# Patient Record
Sex: Female | Born: 1975 | Race: Black or African American | Hispanic: No | Marital: Single | Smoking: Never smoker
Health system: Southern US, Community
[De-identification: ages and names within clinical notes are randomized; demographics above are authoritative.]

## PROBLEM LIST (undated history)

## (undated) ENCOUNTER — Inpatient Hospital Stay (HOSPITAL_COMMUNITY): Payer: Self-pay

## (undated) DIAGNOSIS — M47816 Spondylosis without myelopathy or radiculopathy, lumbar region: Secondary | ICD-10-CM

## (undated) DIAGNOSIS — Z9889 Other specified postprocedural states: Secondary | ICD-10-CM

## (undated) DIAGNOSIS — J189 Pneumonia, unspecified organism: Secondary | ICD-10-CM

## (undated) DIAGNOSIS — E282 Polycystic ovarian syndrome: Secondary | ICD-10-CM

## (undated) DIAGNOSIS — D649 Anemia, unspecified: Secondary | ICD-10-CM

## (undated) DIAGNOSIS — R112 Nausea with vomiting, unspecified: Secondary | ICD-10-CM

## (undated) DIAGNOSIS — R7303 Prediabetes: Secondary | ICD-10-CM

## (undated) DIAGNOSIS — G5601 Carpal tunnel syndrome, right upper limb: Secondary | ICD-10-CM

## (undated) DIAGNOSIS — T4145XA Adverse effect of unspecified anesthetic, initial encounter: Secondary | ICD-10-CM

## (undated) DIAGNOSIS — F32A Depression, unspecified: Secondary | ICD-10-CM

## (undated) DIAGNOSIS — M7989 Other specified soft tissue disorders: Secondary | ICD-10-CM

## (undated) DIAGNOSIS — I10 Essential (primary) hypertension: Secondary | ICD-10-CM

## (undated) DIAGNOSIS — F419 Anxiety disorder, unspecified: Secondary | ICD-10-CM

## (undated) DIAGNOSIS — F909 Attention-deficit hyperactivity disorder, unspecified type: Secondary | ICD-10-CM

## (undated) DIAGNOSIS — N979 Female infertility, unspecified: Secondary | ICD-10-CM

## (undated) DIAGNOSIS — F329 Major depressive disorder, single episode, unspecified: Secondary | ICD-10-CM

## (undated) DIAGNOSIS — G709 Myoneural disorder, unspecified: Secondary | ICD-10-CM

## (undated) DIAGNOSIS — M1990 Unspecified osteoarthritis, unspecified site: Secondary | ICD-10-CM

## (undated) DIAGNOSIS — M549 Dorsalgia, unspecified: Secondary | ICD-10-CM

## (undated) DIAGNOSIS — I2510 Atherosclerotic heart disease of native coronary artery without angina pectoris: Secondary | ICD-10-CM

## (undated) DIAGNOSIS — Z8701 Personal history of pneumonia (recurrent): Secondary | ICD-10-CM

## (undated) DIAGNOSIS — M2550 Pain in unspecified joint: Secondary | ICD-10-CM

## (undated) DIAGNOSIS — T8859XA Other complications of anesthesia, initial encounter: Secondary | ICD-10-CM

## (undated) HISTORY — PX: BACK SURGERY: SHX140

## (undated) HISTORY — DX: Female infertility, unspecified: N97.9

## (undated) HISTORY — DX: Pain in unspecified joint: M25.50

## (undated) HISTORY — DX: Unspecified osteoarthritis, unspecified site: M19.90

## (undated) HISTORY — DX: Polycystic ovarian syndrome: E28.2

## (undated) HISTORY — DX: Anemia, unspecified: D64.9

## (undated) HISTORY — PX: KNEE ARTHROSCOPY: SUR90

## (undated) HISTORY — DX: Depression, unspecified: F32.A

## (undated) HISTORY — PX: DILATION AND CURETTAGE OF UTERUS: SHX78

## (undated) HISTORY — PX: SHOULDER ARTHROSCOPY: SHX128

## (undated) HISTORY — DX: Major depressive disorder, single episode, unspecified: F32.9

## (undated) HISTORY — PX: ANKLE SURGERY: SHX546

## (undated) HISTORY — DX: Anxiety disorder, unspecified: F41.9

## (undated) HISTORY — DX: Essential (primary) hypertension: I10

## (undated) HISTORY — DX: Other specified soft tissue disorders: M79.89

---

## 1998-01-31 ENCOUNTER — Ambulatory Visit (HOSPITAL_COMMUNITY): Admission: RE | Admit: 1998-01-31 | Discharge: 1998-01-31 | Payer: Self-pay | Admitting: Orthopedic Surgery

## 1998-02-28 ENCOUNTER — Ambulatory Visit (HOSPITAL_COMMUNITY): Admission: RE | Admit: 1998-02-28 | Discharge: 1998-02-28 | Payer: Self-pay | Admitting: Orthopedic Surgery

## 1998-10-01 ENCOUNTER — Emergency Department (HOSPITAL_COMMUNITY): Admission: EM | Admit: 1998-10-01 | Discharge: 1998-10-01 | Payer: Self-pay | Admitting: Emergency Medicine

## 1998-10-05 ENCOUNTER — Emergency Department (HOSPITAL_COMMUNITY): Admission: EM | Admit: 1998-10-05 | Discharge: 1998-10-05 | Payer: Self-pay | Admitting: Endocrinology

## 1998-10-05 ENCOUNTER — Encounter: Payer: Self-pay | Admitting: Endocrinology

## 1998-11-27 ENCOUNTER — Emergency Department (HOSPITAL_COMMUNITY): Admission: EM | Admit: 1998-11-27 | Discharge: 1998-11-27 | Payer: Self-pay | Admitting: Emergency Medicine

## 1998-11-27 ENCOUNTER — Encounter: Payer: Self-pay | Admitting: Emergency Medicine

## 1998-12-18 ENCOUNTER — Encounter: Admission: RE | Admit: 1998-12-18 | Discharge: 1999-03-18 | Payer: Self-pay | Admitting: Orthopedic Surgery

## 1998-12-30 ENCOUNTER — Ambulatory Visit (HOSPITAL_COMMUNITY): Admission: RE | Admit: 1998-12-30 | Discharge: 1998-12-30 | Payer: Self-pay | Admitting: Family Medicine

## 1998-12-30 ENCOUNTER — Encounter: Payer: Self-pay | Admitting: Family Medicine

## 1999-02-21 ENCOUNTER — Encounter: Payer: Self-pay | Admitting: Emergency Medicine

## 1999-02-21 ENCOUNTER — Emergency Department (HOSPITAL_COMMUNITY): Admission: EM | Admit: 1999-02-21 | Discharge: 1999-02-21 | Payer: Self-pay | Admitting: Emergency Medicine

## 1999-05-27 ENCOUNTER — Ambulatory Visit (HOSPITAL_COMMUNITY): Admission: RE | Admit: 1999-05-27 | Discharge: 1999-05-27 | Payer: Self-pay | Admitting: Family Medicine

## 1999-05-27 ENCOUNTER — Encounter: Payer: Self-pay | Admitting: Family Medicine

## 1999-12-11 ENCOUNTER — Ambulatory Visit (HOSPITAL_COMMUNITY): Admission: RE | Admit: 1999-12-11 | Discharge: 1999-12-11 | Payer: Self-pay | Admitting: Family Medicine

## 1999-12-11 ENCOUNTER — Encounter: Payer: Self-pay | Admitting: Family Medicine

## 2000-01-27 ENCOUNTER — Other Ambulatory Visit: Admission: RE | Admit: 2000-01-27 | Discharge: 2000-01-27 | Payer: Self-pay | Admitting: Gynecology

## 2000-02-04 ENCOUNTER — Other Ambulatory Visit: Admission: RE | Admit: 2000-02-04 | Discharge: 2000-02-04 | Payer: Self-pay | Admitting: Gynecology

## 2000-02-04 ENCOUNTER — Encounter (INDEPENDENT_AMBULATORY_CARE_PROVIDER_SITE_OTHER): Payer: Self-pay

## 2000-10-06 ENCOUNTER — Encounter: Payer: Self-pay | Admitting: Gynecology

## 2000-10-06 ENCOUNTER — Encounter: Admission: RE | Admit: 2000-10-06 | Discharge: 2000-10-06 | Payer: Self-pay | Admitting: Gynecology

## 2001-06-30 ENCOUNTER — Other Ambulatory Visit: Admission: RE | Admit: 2001-06-30 | Discharge: 2001-06-30 | Payer: Self-pay | Admitting: Gynecology

## 2002-02-01 ENCOUNTER — Ambulatory Visit (HOSPITAL_BASED_OUTPATIENT_CLINIC_OR_DEPARTMENT_OTHER): Admission: RE | Admit: 2002-02-01 | Discharge: 2002-02-02 | Payer: Self-pay | Admitting: Orthopedic Surgery

## 2002-02-01 HISTORY — PX: ENDOSCOPIC PLANTAR FASCIOTOMY: SUR443

## 2002-06-15 ENCOUNTER — Other Ambulatory Visit: Admission: RE | Admit: 2002-06-15 | Discharge: 2002-06-15 | Payer: Self-pay | Admitting: Gynecology

## 2003-01-10 ENCOUNTER — Ambulatory Visit (HOSPITAL_BASED_OUTPATIENT_CLINIC_OR_DEPARTMENT_OTHER): Admission: RE | Admit: 2003-01-10 | Discharge: 2003-01-11 | Payer: Self-pay | Admitting: Orthopedic Surgery

## 2003-01-10 HISTORY — PX: TOE SURGERY: SHX1073

## 2003-03-07 ENCOUNTER — Ambulatory Visit (HOSPITAL_BASED_OUTPATIENT_CLINIC_OR_DEPARTMENT_OTHER): Admission: RE | Admit: 2003-03-07 | Discharge: 2003-03-08 | Payer: Self-pay | Admitting: Orthopedic Surgery

## 2003-03-21 ENCOUNTER — Ambulatory Visit (HOSPITAL_BASED_OUTPATIENT_CLINIC_OR_DEPARTMENT_OTHER): Admission: RE | Admit: 2003-03-21 | Discharge: 2003-03-22 | Payer: Self-pay | Admitting: Orthopedic Surgery

## 2003-07-09 ENCOUNTER — Other Ambulatory Visit: Admission: RE | Admit: 2003-07-09 | Discharge: 2003-07-09 | Payer: Self-pay | Admitting: Gynecology

## 2004-07-24 ENCOUNTER — Other Ambulatory Visit: Admission: RE | Admit: 2004-07-24 | Discharge: 2004-07-24 | Payer: Self-pay | Admitting: Gynecology

## 2004-08-11 ENCOUNTER — Emergency Department (HOSPITAL_COMMUNITY): Admission: EM | Admit: 2004-08-11 | Discharge: 2004-08-11 | Payer: Self-pay | Admitting: Emergency Medicine

## 2004-08-29 ENCOUNTER — Emergency Department (HOSPITAL_COMMUNITY): Admission: EM | Admit: 2004-08-29 | Discharge: 2004-08-29 | Payer: Self-pay | Admitting: Family Medicine

## 2004-11-17 ENCOUNTER — Emergency Department (HOSPITAL_COMMUNITY): Admission: EM | Admit: 2004-11-17 | Discharge: 2004-11-17 | Payer: Self-pay | Admitting: Family Medicine

## 2004-11-26 ENCOUNTER — Ambulatory Visit (HOSPITAL_BASED_OUTPATIENT_CLINIC_OR_DEPARTMENT_OTHER): Admission: RE | Admit: 2004-11-26 | Discharge: 2004-11-26 | Payer: Self-pay | Admitting: Orthopedic Surgery

## 2005-01-14 ENCOUNTER — Ambulatory Visit (HOSPITAL_BASED_OUTPATIENT_CLINIC_OR_DEPARTMENT_OTHER): Admission: RE | Admit: 2005-01-14 | Discharge: 2005-01-15 | Payer: Self-pay | Admitting: Orthopedic Surgery

## 2005-02-08 ENCOUNTER — Encounter: Admission: RE | Admit: 2005-02-08 | Discharge: 2005-05-09 | Payer: Self-pay | Admitting: Orthopedic Surgery

## 2005-03-28 ENCOUNTER — Emergency Department (HOSPITAL_COMMUNITY): Admission: EM | Admit: 2005-03-28 | Discharge: 2005-03-28 | Payer: Self-pay | Admitting: Family Medicine

## 2005-06-29 ENCOUNTER — Encounter: Admission: RE | Admit: 2005-06-29 | Discharge: 2005-09-19 | Payer: Self-pay | Admitting: Physician Assistant

## 2005-07-06 ENCOUNTER — Other Ambulatory Visit: Admission: RE | Admit: 2005-07-06 | Discharge: 2005-07-06 | Payer: Self-pay | Admitting: Gynecology

## 2005-07-15 ENCOUNTER — Emergency Department (HOSPITAL_COMMUNITY): Admission: EM | Admit: 2005-07-15 | Discharge: 2005-07-15 | Payer: Self-pay | Admitting: Family Medicine

## 2005-09-15 ENCOUNTER — Emergency Department (HOSPITAL_COMMUNITY): Admission: EM | Admit: 2005-09-15 | Discharge: 2005-09-15 | Payer: Self-pay | Admitting: Family Medicine

## 2005-09-26 ENCOUNTER — Emergency Department (HOSPITAL_COMMUNITY): Admission: AD | Admit: 2005-09-26 | Discharge: 2005-09-26 | Payer: Self-pay | Admitting: Emergency Medicine

## 2006-07-04 ENCOUNTER — Ambulatory Visit (HOSPITAL_COMMUNITY): Admission: RE | Admit: 2006-07-04 | Discharge: 2006-07-04 | Payer: Self-pay | Admitting: Family Medicine

## 2006-07-04 ENCOUNTER — Emergency Department (HOSPITAL_COMMUNITY): Admission: EM | Admit: 2006-07-04 | Discharge: 2006-07-04 | Payer: Self-pay | Admitting: Family Medicine

## 2006-07-05 ENCOUNTER — Other Ambulatory Visit: Admission: RE | Admit: 2006-07-05 | Discharge: 2006-07-05 | Payer: Self-pay | Admitting: Gynecology

## 2006-07-06 ENCOUNTER — Encounter: Admission: RE | Admit: 2006-07-06 | Discharge: 2006-07-06 | Payer: Self-pay | Admitting: Gynecology

## 2006-08-29 ENCOUNTER — Emergency Department (HOSPITAL_COMMUNITY): Admission: EM | Admit: 2006-08-29 | Discharge: 2006-08-29 | Payer: Self-pay | Admitting: Emergency Medicine

## 2006-08-31 ENCOUNTER — Ambulatory Visit (HOSPITAL_BASED_OUTPATIENT_CLINIC_OR_DEPARTMENT_OTHER): Admission: RE | Admit: 2006-08-31 | Discharge: 2006-08-31 | Payer: Self-pay | Admitting: Orthopedic Surgery

## 2006-09-27 ENCOUNTER — Emergency Department (HOSPITAL_COMMUNITY): Admission: EM | Admit: 2006-09-27 | Discharge: 2006-09-27 | Payer: Self-pay | Admitting: Family Medicine

## 2007-02-10 ENCOUNTER — Emergency Department (HOSPITAL_COMMUNITY): Admission: EM | Admit: 2007-02-10 | Discharge: 2007-02-10 | Payer: Self-pay | Admitting: Family Medicine

## 2007-05-22 ENCOUNTER — Emergency Department (HOSPITAL_COMMUNITY): Admission: EM | Admit: 2007-05-22 | Discharge: 2007-05-22 | Payer: Self-pay | Admitting: *Deleted

## 2007-05-23 ENCOUNTER — Encounter (INDEPENDENT_AMBULATORY_CARE_PROVIDER_SITE_OTHER): Payer: Self-pay | Admitting: *Deleted

## 2007-05-23 ENCOUNTER — Ambulatory Visit: Payer: Self-pay | Admitting: Vascular Surgery

## 2007-05-23 ENCOUNTER — Ambulatory Visit (HOSPITAL_COMMUNITY): Admission: RE | Admit: 2007-05-23 | Discharge: 2007-05-23 | Payer: Self-pay | Admitting: *Deleted

## 2007-07-02 ENCOUNTER — Emergency Department (HOSPITAL_COMMUNITY): Admission: EM | Admit: 2007-07-02 | Discharge: 2007-07-02 | Payer: Self-pay | Admitting: Family Medicine

## 2007-07-20 ENCOUNTER — Other Ambulatory Visit: Admission: RE | Admit: 2007-07-20 | Discharge: 2007-07-20 | Payer: Self-pay | Admitting: Gynecology

## 2007-10-09 ENCOUNTER — Emergency Department (HOSPITAL_COMMUNITY): Admission: EM | Admit: 2007-10-09 | Discharge: 2007-10-09 | Payer: Self-pay | Admitting: Emergency Medicine

## 2007-11-16 ENCOUNTER — Encounter: Admission: RE | Admit: 2007-11-16 | Discharge: 2007-11-16 | Payer: Self-pay | Admitting: Orthopedic Surgery

## 2007-12-07 ENCOUNTER — Emergency Department (HOSPITAL_COMMUNITY): Admission: EM | Admit: 2007-12-07 | Discharge: 2007-12-07 | Payer: Self-pay | Admitting: Emergency Medicine

## 2008-02-21 ENCOUNTER — Emergency Department (HOSPITAL_COMMUNITY): Admission: EM | Admit: 2008-02-21 | Discharge: 2008-02-21 | Payer: Self-pay | Admitting: Family Medicine

## 2008-05-07 ENCOUNTER — Emergency Department (HOSPITAL_COMMUNITY): Admission: EM | Admit: 2008-05-07 | Discharge: 2008-05-07 | Payer: Self-pay | Admitting: Family Medicine

## 2008-06-30 ENCOUNTER — Emergency Department (HOSPITAL_COMMUNITY): Admission: EM | Admit: 2008-06-30 | Discharge: 2008-06-30 | Payer: Self-pay | Admitting: Family Medicine

## 2008-07-24 ENCOUNTER — Other Ambulatory Visit: Admission: RE | Admit: 2008-07-24 | Discharge: 2008-07-24 | Payer: Self-pay

## 2008-08-21 ENCOUNTER — Inpatient Hospital Stay (HOSPITAL_COMMUNITY): Admission: RE | Admit: 2008-08-21 | Discharge: 2008-08-26 | Payer: Self-pay | Admitting: Orthopedic Surgery

## 2008-08-23 ENCOUNTER — Encounter (INDEPENDENT_AMBULATORY_CARE_PROVIDER_SITE_OTHER): Payer: Self-pay | Admitting: Orthopedic Surgery

## 2008-08-23 ENCOUNTER — Ambulatory Visit: Payer: Self-pay | Admitting: Surgery

## 2008-08-26 HISTORY — PX: TOTAL KNEE ARTHROPLASTY: SHX125

## 2009-02-27 ENCOUNTER — Emergency Department (HOSPITAL_COMMUNITY): Admission: EM | Admit: 2009-02-27 | Discharge: 2009-02-27 | Payer: Self-pay | Admitting: Family Medicine

## 2009-03-21 ENCOUNTER — Emergency Department (HOSPITAL_COMMUNITY): Admission: EM | Admit: 2009-03-21 | Discharge: 2009-03-21 | Payer: Self-pay | Admitting: Family Medicine

## 2009-07-14 ENCOUNTER — Emergency Department (HOSPITAL_COMMUNITY): Admission: EM | Admit: 2009-07-14 | Discharge: 2009-07-14 | Payer: Self-pay | Admitting: Emergency Medicine

## 2009-07-22 ENCOUNTER — Encounter: Admission: RE | Admit: 2009-07-22 | Discharge: 2009-07-22 | Payer: Self-pay | Admitting: Sports Medicine

## 2009-09-17 ENCOUNTER — Emergency Department (HOSPITAL_COMMUNITY): Admission: EM | Admit: 2009-09-17 | Discharge: 2009-09-17 | Payer: Self-pay | Admitting: Emergency Medicine

## 2009-12-03 ENCOUNTER — Emergency Department (HOSPITAL_COMMUNITY): Admission: EM | Admit: 2009-12-03 | Discharge: 2009-12-03 | Payer: Self-pay | Admitting: Emergency Medicine

## 2010-01-04 ENCOUNTER — Encounter: Admission: RE | Admit: 2010-01-04 | Discharge: 2010-01-04 | Payer: Self-pay | Admitting: Orthopedic Surgery

## 2010-01-04 ENCOUNTER — Encounter: Admission: RE | Admit: 2010-01-04 | Discharge: 2010-01-04 | Payer: Self-pay | Admitting: Orthopaedic Surgery

## 2010-01-21 ENCOUNTER — Inpatient Hospital Stay (HOSPITAL_COMMUNITY): Admission: RE | Admit: 2010-01-21 | Discharge: 2010-01-26 | Payer: Self-pay | Admitting: Orthopedic Surgery

## 2010-01-21 HISTORY — PX: TOTAL KNEE ARTHROPLASTY: SHX125

## 2010-02-12 ENCOUNTER — Encounter: Admission: RE | Admit: 2010-02-12 | Discharge: 2010-02-12 | Payer: Self-pay | Admitting: Family Medicine

## 2010-06-04 ENCOUNTER — Emergency Department (HOSPITAL_COMMUNITY): Admission: EM | Admit: 2010-06-04 | Discharge: 2010-06-04 | Payer: Self-pay | Admitting: Emergency Medicine

## 2010-06-17 ENCOUNTER — Other Ambulatory Visit: Admission: RE | Admit: 2010-06-17 | Discharge: 2010-06-17 | Payer: Self-pay | Admitting: Obstetrics and Gynecology

## 2010-06-17 ENCOUNTER — Ambulatory Visit: Payer: Self-pay | Admitting: Obstetrics and Gynecology

## 2010-06-23 ENCOUNTER — Ambulatory Visit: Payer: Self-pay | Admitting: Obstetrics and Gynecology

## 2010-07-03 ENCOUNTER — Ambulatory Visit: Payer: Self-pay | Admitting: Gynecology

## 2010-07-14 ENCOUNTER — Ambulatory Visit: Payer: Self-pay | Admitting: Obstetrics and Gynecology

## 2010-07-17 ENCOUNTER — Ambulatory Visit: Payer: Self-pay | Admitting: Obstetrics and Gynecology

## 2010-07-17 ENCOUNTER — Ambulatory Visit (HOSPITAL_BASED_OUTPATIENT_CLINIC_OR_DEPARTMENT_OTHER): Admission: RE | Admit: 2010-07-17 | Discharge: 2010-07-17 | Payer: Self-pay | Admitting: Obstetrics and Gynecology

## 2010-07-17 HISTORY — PX: HYSTEROSCOPY WITH D & C: SHX1775

## 2010-07-22 ENCOUNTER — Ambulatory Visit: Payer: Self-pay | Admitting: Obstetrics and Gynecology

## 2010-08-24 ENCOUNTER — Emergency Department (HOSPITAL_COMMUNITY)
Admission: EM | Admit: 2010-08-24 | Discharge: 2010-08-24 | Payer: Self-pay | Source: Home / Self Care | Admitting: Emergency Medicine

## 2010-08-26 ENCOUNTER — Emergency Department (HOSPITAL_COMMUNITY)
Admission: EM | Admit: 2010-08-26 | Discharge: 2010-08-26 | Payer: Self-pay | Source: Home / Self Care | Admitting: Emergency Medicine

## 2010-08-26 ENCOUNTER — Encounter
Admission: RE | Admit: 2010-08-26 | Discharge: 2010-08-26 | Payer: Self-pay | Source: Home / Self Care | Admitting: Orthopaedic Surgery

## 2010-08-27 ENCOUNTER — Inpatient Hospital Stay (HOSPITAL_COMMUNITY)
Admission: EM | Admit: 2010-08-27 | Discharge: 2010-09-01 | Payer: Self-pay | Source: Home / Self Care | Attending: Internal Medicine | Admitting: Internal Medicine

## 2010-09-20 DIAGNOSIS — I82409 Acute embolism and thrombosis of unspecified deep veins of unspecified lower extremity: Secondary | ICD-10-CM

## 2010-09-23 ENCOUNTER — Emergency Department (HOSPITAL_COMMUNITY)
Admission: EM | Admit: 2010-09-23 | Discharge: 2010-09-23 | Payer: Self-pay | Source: Home / Self Care | Admitting: Family Medicine

## 2010-09-23 ENCOUNTER — Ambulatory Visit
Admission: RE | Admit: 2010-09-23 | Discharge: 2010-09-23 | Payer: Self-pay | Source: Home / Self Care | Attending: Obstetrics and Gynecology | Admitting: Obstetrics and Gynecology

## 2010-10-11 ENCOUNTER — Encounter: Payer: Self-pay | Admitting: Orthopaedic Surgery

## 2010-11-30 LAB — CBC
HCT: 33.5 % — ABNORMAL LOW (ref 36.0–46.0)
HCT: 34.4 % — ABNORMAL LOW (ref 36.0–46.0)
HCT: 35.4 % — ABNORMAL LOW (ref 36.0–46.0)
HCT: 36.1 % (ref 36.0–46.0)
HCT: 37 % (ref 36.0–46.0)
HCT: 37.7 % (ref 36.0–46.0)
HCT: 37.9 % (ref 36.0–46.0)
Hemoglobin: 10.6 g/dL — ABNORMAL LOW (ref 12.0–15.0)
Hemoglobin: 10.8 g/dL — ABNORMAL LOW (ref 12.0–15.0)
Hemoglobin: 11.1 g/dL — ABNORMAL LOW (ref 12.0–15.0)
Hemoglobin: 11.2 g/dL — ABNORMAL LOW (ref 12.0–15.0)
Hemoglobin: 11.4 g/dL — ABNORMAL LOW (ref 12.0–15.0)
Hemoglobin: 12 g/dL (ref 12.0–15.0)
Hemoglobin: 12.2 g/dL (ref 12.0–15.0)
MCH: 24.2 pg — ABNORMAL LOW (ref 26.0–34.0)
MCH: 24.7 pg — ABNORMAL LOW (ref 26.0–34.0)
MCH: 24.9 pg — ABNORMAL LOW (ref 26.0–34.0)
MCH: 25 pg — ABNORMAL LOW (ref 26.0–34.0)
MCH: 25.2 pg — ABNORMAL LOW (ref 26.0–34.0)
MCH: 25.3 pg — ABNORMAL LOW (ref 26.0–34.0)
MCH: 25.5 pg — ABNORMAL LOW (ref 26.0–34.0)
MCHC: 30.3 g/dL (ref 30.0–36.0)
MCHC: 30.7 g/dL (ref 30.0–36.0)
MCHC: 31.4 g/dL (ref 30.0–36.0)
MCHC: 31.6 g/dL (ref 30.0–36.0)
MCHC: 31.7 g/dL (ref 30.0–36.0)
MCHC: 32.2 g/dL (ref 30.0–36.0)
MCHC: 32.4 g/dL (ref 30.0–36.0)
MCV: 78.7 fL (ref 78.0–100.0)
MCV: 78.7 fL (ref 78.0–100.0)
MCV: 78.8 fL (ref 78.0–100.0)
MCV: 79.5 fL (ref 78.0–100.0)
MCV: 79.6 fL (ref 78.0–100.0)
MCV: 79.9 fL (ref 78.0–100.0)
MCV: 80.2 fL (ref 78.0–100.0)
Platelets: 206 10*3/uL (ref 150–400)
Platelets: 215 10*3/uL (ref 150–400)
Platelets: 231 10*3/uL (ref 150–400)
Platelets: 242 10*3/uL (ref 150–400)
Platelets: 251 10*3/uL (ref 150–400)
Platelets: 270 10*3/uL (ref 150–400)
Platelets: 293 10*3/uL (ref 150–400)
RBC: 4.25 MIL/uL (ref 3.87–5.11)
RBC: 4.32 MIL/uL (ref 3.87–5.11)
RBC: 4.5 MIL/uL (ref 3.87–5.11)
RBC: 4.5 MIL/uL (ref 3.87–5.11)
RBC: 4.63 MIL/uL (ref 3.87–5.11)
RBC: 4.77 MIL/uL (ref 3.87–5.11)
RBC: 4.79 MIL/uL (ref 3.87–5.11)
RDW: 14.1 % (ref 11.5–15.5)
RDW: 14.2 % (ref 11.5–15.5)
RDW: 14.3 % (ref 11.5–15.5)
RDW: 14.3 % (ref 11.5–15.5)
RDW: 14.4 % (ref 11.5–15.5)
RDW: 14.5 % (ref 11.5–15.5)
RDW: 14.5 % (ref 11.5–15.5)
WBC: 10 10*3/uL (ref 4.0–10.5)
WBC: 11.3 10*3/uL — ABNORMAL HIGH (ref 4.0–10.5)
WBC: 7.6 10*3/uL (ref 4.0–10.5)
WBC: 8.2 10*3/uL (ref 4.0–10.5)
WBC: 8.3 10*3/uL (ref 4.0–10.5)
WBC: 8.7 10*3/uL (ref 4.0–10.5)
WBC: 9.5 10*3/uL (ref 4.0–10.5)

## 2010-11-30 LAB — BASIC METABOLIC PANEL
BUN: 10 mg/dL (ref 6–23)
BUN: 11 mg/dL (ref 6–23)
BUN: 9 mg/dL (ref 6–23)
BUN: 9 mg/dL (ref 6–23)
BUN: 9 mg/dL (ref 6–23)
CO2: 26 mEq/L (ref 19–32)
CO2: 26 mEq/L (ref 19–32)
CO2: 26 mEq/L (ref 19–32)
CO2: 28 mEq/L (ref 19–32)
CO2: 28 mEq/L (ref 19–32)
Calcium: 8.5 mg/dL (ref 8.4–10.5)
Calcium: 8.7 mg/dL (ref 8.4–10.5)
Calcium: 8.9 mg/dL (ref 8.4–10.5)
Calcium: 9.1 mg/dL (ref 8.4–10.5)
Calcium: 9.1 mg/dL (ref 8.4–10.5)
Chloride: 102 mEq/L (ref 96–112)
Chloride: 102 mEq/L (ref 96–112)
Chloride: 104 mEq/L (ref 96–112)
Chloride: 108 mEq/L (ref 96–112)
Chloride: 98 mEq/L (ref 96–112)
Creatinine, Ser: 0.85 mg/dL (ref 0.4–1.2)
Creatinine, Ser: 0.86 mg/dL (ref 0.4–1.2)
Creatinine, Ser: 0.87 mg/dL (ref 0.4–1.2)
Creatinine, Ser: 0.88 mg/dL (ref 0.4–1.2)
Creatinine, Ser: 0.89 mg/dL (ref 0.4–1.2)
GFR calc Af Amer: >60 mL/min (ref >60)
GFR calc Af Amer: >60 mL/min (ref >60)
GFR calc Af Amer: >60 mL/min (ref >60)
GFR calc Af Amer: >60 mL/min (ref >60)
GFR calc Af Amer: >60 mL/min (ref >60)
GFR calc non Af Amer: >60 mL/min (ref >60)
GFR calc non Af Amer: >60 mL/min (ref >60)
GFR calc non Af Amer: >60 mL/min (ref >60)
GFR calc non Af Amer: >60 mL/min (ref >60)
GFR calc non Af Amer: >60 mL/min (ref >60)
Glucose, Bld: 120 mg/dL — ABNORMAL HIGH (ref 70–99)
Glucose, Bld: 141 mg/dL — ABNORMAL HIGH (ref 70–99)
Glucose, Bld: 72 mg/dL (ref 70–99)
Glucose, Bld: 93 mg/dL (ref 70–99)
Glucose, Bld: 99 mg/dL (ref 70–99)
Potassium: 3.5 mEq/L (ref 3.5–5.1)
Potassium: 3.7 mEq/L (ref 3.5–5.1)
Potassium: 4.1 mEq/L (ref 3.5–5.1)
Potassium: 4.3 mEq/L (ref 3.5–5.1)
Potassium: 4.4 mEq/L (ref 3.5–5.1)
Sodium: 136 mEq/L (ref 135–145)
Sodium: 137 mEq/L (ref 135–145)
Sodium: 138 mEq/L (ref 135–145)
Sodium: 138 mEq/L (ref 135–145)
Sodium: 141 mEq/L (ref 135–145)

## 2010-11-30 LAB — DIFFERENTIAL
Basophils Absolute: 0 10*3/uL (ref 0.0–0.1)
Basophils Absolute: 0 10*3/uL (ref 0.0–0.1)
Basophils Absolute: 0 10*3/uL (ref 0.0–0.1)
Basophils Relative: 0 % (ref 0–1)
Basophils Relative: 0 % (ref 0–1)
Basophils Relative: 1 % (ref 0–1)
Eosinophils Absolute: 0.2 10*3/uL (ref 0.0–0.7)
Eosinophils Absolute: 0.3 10*3/uL (ref 0.0–0.7)
Eosinophils Absolute: 0.3 10*3/uL (ref 0.0–0.7)
Eosinophils Relative: 3 % (ref 0–5)
Eosinophils Relative: 3 % (ref 0–5)
Eosinophils Relative: 3 % (ref 0–5)
Lymphocytes Relative: 17 % (ref 12–46)
Lymphocytes Relative: 23 % (ref 12–46)
Lymphocytes Relative: 28 % (ref 12–46)
Lymphs Abs: 1.9 10*3/uL (ref 0.7–4.0)
Lymphs Abs: 2.3 10*3/uL (ref 0.7–4.0)
Lymphs Abs: 2.3 10*3/uL (ref 0.7–4.0)
Monocytes Absolute: 0.7 10*3/uL (ref 0.1–1.0)
Monocytes Absolute: 0.8 10*3/uL (ref 0.1–1.0)
Monocytes Absolute: 0.9 10*3/uL (ref 0.1–1.0)
Monocytes Relative: 7 % (ref 3–12)
Monocytes Relative: 8 % (ref 3–12)
Monocytes Relative: 9 % (ref 3–12)
Neutro Abs: 5 10*3/uL (ref 1.7–7.7)
Neutro Abs: 6.5 10*3/uL (ref 1.7–7.7)
Neutro Abs: 8.3 10*3/uL — ABNORMAL HIGH (ref 1.7–7.7)
Neutrophils Relative %: 61 % (ref 43–77)
Neutrophils Relative %: 65 % (ref 43–77)
Neutrophils Relative %: 73 % (ref 43–77)

## 2010-11-30 LAB — PROTIME-INR
INR: 0.95 (ref 0.00–1.49)
INR: 0.96 (ref 0.00–1.49)
INR: 1 (ref 0.00–1.49)
INR: 1.28 (ref 0.00–1.49)
INR: 1.59 — ABNORMAL HIGH (ref 0.00–1.49)
INR: 1.8 — ABNORMAL HIGH (ref 0.00–1.49)
INR: 2.25 — ABNORMAL HIGH (ref 0.00–1.49)
Prothrombin Time: 12.9 seconds (ref 11.6–15.2)
Prothrombin Time: 13 seconds (ref 11.6–15.2)
Prothrombin Time: 13.4 seconds (ref 11.6–15.2)
Prothrombin Time: 16.2 seconds — ABNORMAL HIGH (ref 11.6–15.2)
Prothrombin Time: 19.1 seconds — ABNORMAL HIGH (ref 11.6–15.2)
Prothrombin Time: 21.1 seconds — ABNORMAL HIGH (ref 11.6–15.2)
Prothrombin Time: 25 seconds — ABNORMAL HIGH (ref 11.6–15.2)

## 2010-11-30 LAB — POCT I-STAT, CHEM 8
BUN: 12 mg/dL (ref 6–23)
Calcium, Ion: 1.11 mmol/L — ABNORMAL LOW (ref 1.12–1.32)
Chloride: 105 mEq/L (ref 96–112)
Creatinine, Ser: 0.9 mg/dL (ref 0.4–1.2)
Glucose, Bld: 69 mg/dL — ABNORMAL LOW (ref 70–99)
HCT: 40 % (ref 36.0–46.0)
Hemoglobin: 13.6 g/dL (ref 12.0–15.0)
Potassium: 3.8 mEq/L (ref 3.5–5.1)
Sodium: 141 mEq/L (ref 135–145)
TCO2: 30 mmol/L (ref 0–100)

## 2010-11-30 LAB — APTT: aPTT: 27 seconds (ref 24–37)

## 2010-11-30 LAB — COMPREHENSIVE METABOLIC PANEL
ALT: 13 U/L (ref 0–35)
AST: 15 U/L (ref 0–37)
Albumin: 2.9 g/dL — ABNORMAL LOW (ref 3.5–5.2)
Alkaline Phosphatase: 66 U/L (ref 39–117)
BUN: 8 mg/dL (ref 6–23)
CO2: 26 mEq/L (ref 19–32)
Calcium: 8.6 mg/dL (ref 8.4–10.5)
Chloride: 107 mEq/L (ref 96–112)
Creatinine, Ser: 0.82 mg/dL (ref 0.4–1.2)
GFR calc Af Amer: >60 mL/min (ref >60)
GFR calc non Af Amer: >60 mL/min (ref >60)
Glucose, Bld: 115 mg/dL — ABNORMAL HIGH (ref 70–99)
Potassium: 4 mEq/L (ref 3.5–5.1)
Sodium: 138 mEq/L (ref 135–145)
Total Bilirubin: 0.3 mg/dL (ref 0.3–1.2)
Total Protein: 6.9 g/dL (ref 6.0–8.3)

## 2010-11-30 LAB — IRON AND TIBC
Iron: 21 ug/dL — ABNORMAL LOW (ref 42–135)
Saturation Ratios: 6 % — ABNORMAL LOW (ref 20–55)
TIBC: 365 ug/dL (ref 250–470)
UIBC: 344 ug/dL

## 2010-11-30 LAB — PTH, INTACT AND CALCIUM
Calcium, Total (PTH): 8.3 mg/dL — ABNORMAL LOW (ref 8.4–10.5)
PTH: 28.5 pg/mL (ref 14.0–72.0)

## 2010-11-30 LAB — FOLATE: Folate: 8.6 ng/mL

## 2010-11-30 LAB — FERRITIN: Ferritin: 26 ng/mL (ref 10–291)

## 2010-11-30 LAB — VITAMIN B12: Vitamin B-12: 268 pg/mL (ref 211–911)

## 2010-11-30 LAB — PREGNANCY, URINE: Preg Test, Ur: NEGATIVE

## 2010-11-30 LAB — MAGNESIUM: Magnesium: 1.9 mg/dL (ref 1.5–2.5)

## 2010-12-03 LAB — URINE MICROSCOPIC-ADD ON

## 2010-12-03 LAB — URINALYSIS, ROUTINE W REFLEX MICROSCOPIC
Glucose, UA: NEGATIVE mg/dL
Hgb urine dipstick: NEGATIVE
Ketones, ur: 15 mg/dL — AB
Nitrite: NEGATIVE
Protein, ur: 30 mg/dL — AB
Specific Gravity, Urine: 1.029 (ref 1.005–1.030)
Urobilinogen, UA: 1 mg/dL (ref 0.0–1.0)
pH: 6.5 (ref 5.0–8.0)

## 2010-12-03 LAB — HEPATIC FUNCTION PANEL
ALT: 17 U/L (ref 0–35)
AST: 28 U/L (ref 0–37)
Albumin: 3.6 g/dL (ref 3.5–5.2)
Alkaline Phosphatase: 81 U/L (ref 39–117)
Bilirubin, Direct: 0.2 mg/dL (ref 0.0–0.3)
Indirect Bilirubin: 0.4 mg/dL (ref 0.3–0.9)
Total Bilirubin: 0.6 mg/dL (ref 0.3–1.2)
Total Protein: 7.3 g/dL (ref 6.0–8.3)

## 2010-12-03 LAB — URINE CULTURE
Colony Count: NO GROWTH
Culture  Setup Time: 201109151640
Culture: NO GROWTH

## 2010-12-03 LAB — WET PREP, GENITAL
Clue Cells Wet Prep HPF POC: NONE SEEN
Trich, Wet Prep: NONE SEEN
WBC, Wet Prep HPF POC: NONE SEEN
Yeast Wet Prep HPF POC: NONE SEEN

## 2010-12-03 LAB — DIFFERENTIAL
Basophils Absolute: 0 10*3/uL (ref 0.0–0.1)
Basophils Relative: 0 % (ref 0–1)
Eosinophils Absolute: 0 10*3/uL (ref 0.0–0.7)
Eosinophils Relative: 0 % (ref 0–5)
Lymphocytes Relative: 22 % (ref 12–46)
Lymphs Abs: 2.5 10*3/uL (ref 0.7–4.0)
Monocytes Absolute: 0.8 10*3/uL (ref 0.1–1.0)
Monocytes Relative: 7 % (ref 3–12)
Neutro Abs: 8.3 10*3/uL — ABNORMAL HIGH (ref 1.7–7.7)
Neutrophils Relative %: 71 % (ref 43–77)

## 2010-12-03 LAB — CBC
HCT: 37.9 % (ref 36.0–46.0)
Hemoglobin: 12.4 g/dL (ref 12.0–15.0)
MCH: 25.6 pg — ABNORMAL LOW (ref 26.0–34.0)
MCHC: 32.7 g/dL (ref 30.0–36.0)
MCV: 78.1 fL (ref 78.0–100.0)
Platelets: 215 10*3/uL (ref 150–400)
RBC: 4.85 MIL/uL (ref 3.87–5.11)
RDW: 16.8 % — ABNORMAL HIGH (ref 11.5–15.5)
WBC: 11.7 10*3/uL — ABNORMAL HIGH (ref 4.0–10.5)

## 2010-12-03 LAB — POCT I-STAT, CHEM 8
BUN: 10 mg/dL (ref 6–23)
Calcium, Ion: 1.02 mmol/L — ABNORMAL LOW (ref 1.12–1.32)
Chloride: 107 mEq/L (ref 96–112)
Creatinine, Ser: 1.1 mg/dL (ref 0.4–1.2)
Glucose, Bld: 114 mg/dL — ABNORMAL HIGH (ref 70–99)
HCT: 41 % (ref 36.0–46.0)
Hemoglobin: 13.9 g/dL (ref 12.0–15.0)
Potassium: 3.7 mEq/L (ref 3.5–5.1)
Sodium: 140 mEq/L (ref 135–145)
TCO2: 23 mmol/L (ref 0–100)

## 2010-12-03 LAB — GC/CHLAMYDIA PROBE AMP, GENITAL
Chlamydia, DNA Probe: NEGATIVE
GC Probe Amp, Genital: NEGATIVE

## 2010-12-03 LAB — SAMPLE TO BLOOD BANK

## 2010-12-03 LAB — APTT: aPTT: 26 seconds (ref 24–37)

## 2010-12-03 LAB — POCT PREGNANCY, URINE: Preg Test, Ur: NEGATIVE

## 2010-12-03 LAB — PROTIME-INR
INR: 0.95 (ref 0.00–1.49)
Prothrombin Time: 12.9 seconds (ref 11.6–15.2)

## 2010-12-08 LAB — BASIC METABOLIC PANEL
BUN: 10 mg/dL (ref 6–23)
BUN: 11 mg/dL (ref 6–23)
BUN: 6 mg/dL (ref 6–23)
BUN: 6 mg/dL (ref 6–23)
BUN: 7 mg/dL (ref 6–23)
CO2: 29 mEq/L (ref 19–32)
CO2: 29 mEq/L (ref 19–32)
CO2: 29 mEq/L (ref 19–32)
CO2: 32 mEq/L (ref 19–32)
CO2: 33 mEq/L — ABNORMAL HIGH (ref 19–32)
Calcium: 8.4 mg/dL (ref 8.4–10.5)
Calcium: 8.4 mg/dL (ref 8.4–10.5)
Calcium: 8.6 mg/dL (ref 8.4–10.5)
Calcium: 8.8 mg/dL (ref 8.4–10.5)
Calcium: 8.9 mg/dL (ref 8.4–10.5)
Chloride: 100 mEq/L (ref 96–112)
Chloride: 101 mEq/L (ref 96–112)
Chloride: 101 mEq/L (ref 96–112)
Chloride: 102 mEq/L (ref 96–112)
Chloride: 105 mEq/L (ref 96–112)
Creatinine, Ser: 0.78 mg/dL (ref 0.4–1.2)
Creatinine, Ser: 0.8 mg/dL (ref 0.4–1.2)
Creatinine, Ser: 0.8 mg/dL (ref 0.4–1.2)
Creatinine, Ser: 0.9 mg/dL (ref 0.4–1.2)
Creatinine, Ser: 0.91 mg/dL (ref 0.4–1.2)
GFR calc Af Amer: >60 mL/min (ref >60)
GFR calc Af Amer: >60 mL/min (ref >60)
GFR calc Af Amer: >60 mL/min (ref >60)
GFR calc Af Amer: >60 mL/min (ref >60)
GFR calc Af Amer: >60 mL/min (ref >60)
GFR calc non Af Amer: >60 mL/min (ref >60)
GFR calc non Af Amer: >60 mL/min (ref >60)
GFR calc non Af Amer: >60 mL/min (ref >60)
GFR calc non Af Amer: >60 mL/min (ref >60)
GFR calc non Af Amer: >60 mL/min (ref >60)
Glucose, Bld: 105 mg/dL — ABNORMAL HIGH (ref 70–99)
Glucose, Bld: 114 mg/dL — ABNORMAL HIGH (ref 70–99)
Glucose, Bld: 124 mg/dL — ABNORMAL HIGH (ref 70–99)
Glucose, Bld: 135 mg/dL — ABNORMAL HIGH (ref 70–99)
Glucose, Bld: 99 mg/dL (ref 70–99)
Potassium: 4.1 mEq/L (ref 3.5–5.1)
Potassium: 4.2 mEq/L (ref 3.5–5.1)
Potassium: 4.2 mEq/L (ref 3.5–5.1)
Potassium: 4.5 mEq/L (ref 3.5–5.1)
Potassium: 4.8 mEq/L (ref 3.5–5.1)
Sodium: 134 mEq/L — ABNORMAL LOW (ref 135–145)
Sodium: 136 mEq/L (ref 135–145)
Sodium: 138 mEq/L (ref 135–145)
Sodium: 139 mEq/L (ref 135–145)
Sodium: 139 mEq/L (ref 135–145)

## 2010-12-08 LAB — COMPREHENSIVE METABOLIC PANEL
ALT: 14 U/L (ref 0–35)
AST: 20 U/L (ref 0–37)
Albumin: 3.8 g/dL (ref 3.5–5.2)
Alkaline Phosphatase: 88 U/L (ref 39–117)
BUN: 11 mg/dL (ref 6–23)
CO2: 28 mEq/L (ref 19–32)
Calcium: 8.8 mg/dL (ref 8.4–10.5)
Chloride: 105 mEq/L (ref 96–112)
Creatinine, Ser: 0.83 mg/dL (ref 0.4–1.2)
GFR calc Af Amer: >60 mL/min (ref >60)
GFR calc non Af Amer: >60 mL/min (ref >60)
Glucose, Bld: 85 mg/dL (ref 70–99)
Potassium: 3.6 mEq/L (ref 3.5–5.1)
Sodium: 138 mEq/L (ref 135–145)
Total Bilirubin: 0.9 mg/dL (ref 0.3–1.2)
Total Protein: 7.6 g/dL (ref 6.0–8.3)

## 2010-12-08 LAB — CBC
HCT: 30.1 % — ABNORMAL LOW (ref 36.0–46.0)
HCT: 30.4 % — ABNORMAL LOW (ref 36.0–46.0)
HCT: 31.4 % — ABNORMAL LOW (ref 36.0–46.0)
HCT: 31.5 % — ABNORMAL LOW (ref 36.0–46.0)
HCT: 34.4 % — ABNORMAL LOW (ref 36.0–46.0)
HCT: 39.1 % (ref 36.0–46.0)
Hemoglobin: 10 g/dL — ABNORMAL LOW (ref 12.0–15.0)
Hemoglobin: 10.1 g/dL — ABNORMAL LOW (ref 12.0–15.0)
Hemoglobin: 10.6 g/dL — ABNORMAL LOW (ref 12.0–15.0)
Hemoglobin: 10.6 g/dL — ABNORMAL LOW (ref 12.0–15.0)
Hemoglobin: 11.7 g/dL — ABNORMAL LOW (ref 12.0–15.0)
Hemoglobin: 13 g/dL (ref 12.0–15.0)
MCHC: 33 g/dL (ref 30.0–36.0)
MCHC: 33.7 g/dL (ref 30.0–36.0)
MCHC: 33.7 g/dL (ref 30.0–36.0)
MCHC: 33.7 g/dL (ref 30.0–36.0)
MCHC: 34 g/dL (ref 30.0–36.0)
MCHC: 33.2 g/dL (ref 30.0–36.0)
MCV: 82.7 fL (ref 78.0–100.0)
MCV: 82.9 fL (ref 78.0–100.0)
MCV: 83.1 fL (ref 78.0–100.0)
MCV: 83.3 fL (ref 78.0–100.0)
MCV: 83.4 fL (ref 78.0–100.0)
MCV: 83.2 fL (ref 78.0–100.0)
Platelets: 150 10*3/uL (ref 150–400)
Platelets: 156 10*3/uL (ref 150–400)
Platelets: 172 10*3/uL (ref 150–400)
Platelets: 172 10*3/uL (ref 150–400)
Platelets: 208 10*3/uL (ref 150–400)
Platelets: 187 10*3/uL (ref 150–400)
RBC: 3.63 MIL/uL — ABNORMAL LOW (ref 3.87–5.11)
RBC: 3.68 MIL/uL — ABNORMAL LOW (ref 3.87–5.11)
RBC: 3.76 MIL/uL — ABNORMAL LOW (ref 3.87–5.11)
RBC: 3.78 MIL/uL — ABNORMAL LOW (ref 3.87–5.11)
RBC: 4.12 MIL/uL (ref 3.87–5.11)
RBC: 4.7 MIL/uL (ref 3.87–5.11)
RDW: 14.8 % (ref 11.5–15.5)
RDW: 15 % (ref 11.5–15.5)
RDW: 15.3 % (ref 11.5–15.5)
RDW: 15.5 % (ref 11.5–15.5)
RDW: 15.7 % — ABNORMAL HIGH (ref 11.5–15.5)
RDW: 15.3 % (ref 11.5–15.5)
WBC: 10.2 10*3/uL (ref 4.0–10.5)
WBC: 10.3 10*3/uL (ref 4.0–10.5)
WBC: 11.1 10*3/uL — ABNORMAL HIGH (ref 4.0–10.5)
WBC: 9 10*3/uL (ref 4.0–10.5)
WBC: 9.9 10*3/uL (ref 4.0–10.5)
WBC: 7.9 10*3/uL (ref 4.0–10.5)

## 2010-12-08 LAB — PROTIME-INR
INR: 1.06 (ref 0.00–1.49)
INR: 1.31 (ref 0.00–1.49)
INR: 1.42 (ref 0.00–1.49)
INR: 1.49 (ref 0.00–1.49)
INR: 1.71 — ABNORMAL HIGH (ref 0.00–1.49)
INR: 0.99 (ref 0.00–1.49)
Prothrombin Time: 13.7 seconds (ref 11.6–15.2)
Prothrombin Time: 16.2 seconds — ABNORMAL HIGH (ref 11.6–15.2)
Prothrombin Time: 17.2 seconds — ABNORMAL HIGH (ref 11.6–15.2)
Prothrombin Time: 17.9 seconds — ABNORMAL HIGH (ref 11.6–15.2)
Prothrombin Time: 19.9 seconds — ABNORMAL HIGH (ref 11.6–15.2)
Prothrombin Time: 13 seconds (ref 11.6–15.2)

## 2010-12-08 LAB — URINALYSIS, ROUTINE W REFLEX MICROSCOPIC
Bilirubin Urine: NEGATIVE
Glucose, UA: NEGATIVE mg/dL
Hgb urine dipstick: NEGATIVE
Ketones, ur: NEGATIVE mg/dL
Nitrite: NEGATIVE
Protein, ur: NEGATIVE mg/dL
Specific Gravity, Urine: 1.025 (ref 1.005–1.030)
Urobilinogen, UA: 1 mg/dL (ref 0.0–1.0)
pH: 6.5 (ref 5.0–8.0)

## 2010-12-08 LAB — TYPE AND SCREEN
ABO/RH(D): B POS
Antibody Screen: NEGATIVE

## 2010-12-08 LAB — HCG, SERUM, QUALITATIVE: Preg, Serum: NEGATIVE

## 2010-12-08 LAB — APTT: aPTT: 26 seconds (ref 24–37)

## 2010-12-28 LAB — POCT URINALYSIS DIP (DEVICE)
Bilirubin Urine: NEGATIVE
Glucose, UA: NEGATIVE mg/dL
Hgb urine dipstick: NEGATIVE
Ketones, ur: NEGATIVE mg/dL
Nitrite: NEGATIVE
Protein, ur: NEGATIVE mg/dL
Specific Gravity, Urine: 1.02 (ref 1.005–1.030)
Urobilinogen, UA: 1 mg/dL (ref 0.0–1.0)
pH: 7 (ref 5.0–8.0)

## 2010-12-28 LAB — POCT PREGNANCY, URINE: Preg Test, Ur: NEGATIVE

## 2010-12-29 ENCOUNTER — Encounter: Payer: Self-pay | Admitting: Oncology

## 2010-12-30 ENCOUNTER — Other Ambulatory Visit: Payer: Self-pay | Admitting: Oncology

## 2010-12-30 ENCOUNTER — Encounter (HOSPITAL_BASED_OUTPATIENT_CLINIC_OR_DEPARTMENT_OTHER): Payer: 59 | Admitting: Oncology

## 2010-12-30 DIAGNOSIS — I824Z9 Acute embolism and thrombosis of unspecified deep veins of unspecified distal lower extremity: Secondary | ICD-10-CM

## 2010-12-30 DIAGNOSIS — K589 Irritable bowel syndrome without diarrhea: Secondary | ICD-10-CM

## 2010-12-30 DIAGNOSIS — E669 Obesity, unspecified: Secondary | ICD-10-CM

## 2010-12-30 DIAGNOSIS — I10 Essential (primary) hypertension: Secondary | ICD-10-CM

## 2010-12-30 LAB — CBC WITH DIFFERENTIAL/PLATELET
BASO%: 0.2 % (ref 0.0–2.0)
Basophils Absolute: 0 10*3/uL (ref 0.0–0.1)
EOS%: 0.2 % (ref 0.0–7.0)
Eosinophils Absolute: 0 10*3/uL (ref 0.0–0.5)
HCT: 35.3 % (ref 34.8–46.6)
HGB: 11.6 g/dL (ref 11.6–15.9)
LYMPH%: 10.9 % — ABNORMAL LOW (ref 14.0–49.7)
MCH: 25.7 pg (ref 25.1–34.0)
MCHC: 32.8 g/dL (ref 31.5–36.0)
MCV: 78.4 fL — ABNORMAL LOW (ref 79.5–101.0)
MONO#: 0.5 10*3/uL (ref 0.1–0.9)
MONO%: 4.7 % (ref 0.0–14.0)
NEUT#: 9.1 10*3/uL — ABNORMAL HIGH (ref 1.5–6.5)
NEUT%: 84 % — ABNORMAL HIGH (ref 38.4–76.8)
Platelets: 241 10*3/uL (ref 145–400)
RBC: 4.5 10*6/uL (ref 3.70–5.45)
RDW: 19.5 % — ABNORMAL HIGH (ref 11.2–14.5)
WBC: 10.8 10*3/uL — ABNORMAL HIGH (ref 3.9–10.3)
lymph#: 1.2 10*3/uL (ref 0.9–3.3)

## 2011-01-15 ENCOUNTER — Encounter: Payer: 59 | Admitting: Oncology

## 2011-02-02 NOTE — Op Note (Signed)
NAMEMarland [name]  ARCENIA, SCARBRO NO.:  [id number]   MEDICAL RECORD NO.:  [id number]          PATIENT TYPE:  INP   LOCATION:  5019                         FACILITY:  [facility]   PHYSICIAN:  Loreta Ave, M.D. DATE OF BIRTH:  11/04/1975   DATE OF PROCEDURE:  08/26/2008  DATE OF DISCHARGE:  08/26/2008                               OPERATIVE REPORT   PREOPERATIVE DIAGNOSES:  1. End-stage degenerative arthritis, right knee varus alignment.  2. Previous anterior cruciate ligament reconstruction with retained      metallic screws, tibia and femur.   POSTOPERATIVE DIAGNOSES:  1. End-stage degenerative arthritis, right knee varus alignment.  2. Previous anterior cruciate ligament reconstruction with retained      metallic screws, tibia and femur.   OPERATIVE PROCEDURE:  Conversion of right knee to a right total knee  replacement.  Modified minimally invasive approach.  Stryker triathlon  prosthesis.  Soft tissue balancing medial capsular release.  Removal of  tibial and femoral screws with placement of bone graft in the screw  holes from the bone cuttings.  I cemented pegged posterior stabilized #6  femoral component.  Cemented #6 tibial component with an 11-mm  polyethylene insert.  A 35-mm pegged cemented medial offset patellar  component.   SURGEON:  Loreta Ave, MD   ASSISTANT:  Genene Churn. Barry Dienes, [place], present throughout the entire case  necessary for timely completion of procedure.   ANESTHESIA:  General.   BLOOD LOSS:  Minimal.   SPECIMENS:  None.   CULTURES:  None.   COMPLICATIONS:  None.   DRESSINGS:  Soft compressive with knee immobilizer.   DRAIN:  Hemovac x1.   TOURNIQUET TIME:  One hour and 30 minutes.   PROCEDURE:  The patient was brought to the operating room, placed on the  operating table in supine position.  After adequate anesthesia was  obtained, right knee examined.  Minimal flexion and contracture  alignment, varus correctable neutral,  stable ligaments, further flexion  limited to 100 degrees.  Tourniquet applied.  Prepped and draped in  usual sterile fashion.  Exsanguinated with elevation Esmarch.  Tourniquet inflated to 350 mmHg.  Straight incision above the patella  down to the tibial tubercle excising previous scar for revision for  cosmetic purposes.  Medial arthrotomy up to the superomedial border of  the patella and then vastus splitting.  Knee exposed.  Dissection of the  capsule taken down off the anteromedial aspect of the tibia for medial  capsular release and also to expose the tibial screw there.  Screw was  able to be identified as bone was chipped away from that area and then  the screw backed up.  I used bone cutting layer to pack that hole for  bone grafting to fill in that defect, which was below the tibial  component.  Knee exposed.  Remnants of menisci, cruciate ligament,  periarticular spurs, and loose bodies were removed.  Intramedullary  guide was placed in the femur.  Distal cut set at 5 degrees of valgus  removing 10 mm.  Marking epicondylar axis sized, cut, and fitted for #6  femoral component.  Posterior spurs removed.  Tibia exposed.  Extramedullary guide.  A 3-degree posterior slope cut resecting below  the defects exposing medial side.  Size #6 component.  After clearing in  all recesses in both flexion/extension especially behind the femur where  there were spurs posteromedially.  All trials were put in place.  A #6  on the femur and #6 on the tibia.  With the 11-mm insert, full  extension, full flexion, good alignment, and good stability.  Patella  exposed, spurs removed.  Measured, then the posterior 10 mm removed.  Size drilled and fitted with a 35-mm component.  Excellent  patellofemoral tracking at completion.  All trials were removed.  Copious irrigation with pulse irrigating device.  Of note, when I was  preparing the femur, before I made the distal cut, I was able to expose  the  screw up in the femur and that was also removed with a screwdriver  before the definitive cuts of the tibia were completed.  Copious  irrigation pulse irrigating device.  Cement prepared and placed on all  components, which were firmly seated.  Polyethylene attached to tibia  and knee reduced.  Once the cement hardened, the knee was reexamined.  Full extension, full flexion, good alignment, good stability, and good  patellofemoral tracking.  Hemovac placed through a separate stab wound.  Arthrotomy closed with #1 Vicryl.  Skin and subcutaneous tissue with  Vicryl and staples.  Margins were injected with Marcaine.  Knee injected  with Marcaine.  Hemovac clamped.  Sterile compressive dressing applied.  Tourniquet was inflated and removed.  Knee immobilizer applied.  Anesthesia reversed.  Brought to recovery room.  Tolerated surgery well.  No complications.      Loreta Ave, M.D.  Electronically Signed     DFM/MEDQ  D:  08/26/2008  T:  08/26/2008  Job:  [id number]

## 2011-02-05 NOTE — Op Note (Signed)
NAMEMARTESHA, NIEDERMEIER NO.:  [id number]   MEDICAL RECORD NO.:  [id number]          PATIENT TYPE:  EMS   LOCATION:  URG                          FACILITY:  [facility]   PHYSICIAN:  Loreta Ave, M.D. DATE OF BIRTH:  April 21, 1976   DATE OF PROCEDURE:  08/31/2006  DATE OF DISCHARGE:  08/29/2006                               OPERATIVE REPORT   PREOPERATIVE DIAGNOSIS:  Progressive posttraumatic degenerative  arthritis, right knee.  New lateral meniscus tear.   POSTOPERATIVE DIAGNOSIS:  Progressive posttraumatic degenerative  arthritis, right knee.  New lateral meniscus tear.  With diffuse grade  II and III chondromalacia, all three compartments, some focal grade IV  trochlear and lateral tibial plateau.  Torn lateral meniscus.  Previous  partial medial meniscectomy.  No new tears.  Previous acromioclavicular  ligament reconstruction intact.  Chondral loose bodies.   DESCRIPTION OF PROCEDURE:  Right knee examination under anesthesia;  arthroscopy; chondroplasty, patella; removal of chondral loose bodies;  lysis and debridement of adhesions.  Partial lateral meniscectomy.  Chondroplasty, all three compartments.   SURGEON:  Loreta Ave, M.D.   ASSISTANT SURGEON:  Genene Churn. Denton Meek.   ANESTHESIA:  General.   ESTIMATED BLOOD LOSS:  Minimal.   TOURNIQUET TIME:  Not employed.   SPECIMENS:  None.   CULTURES:  None.   COMPLICATIONS:  None.   DRESSING:  Soft compressive.   DESCRIPTION OF PROCEDURE:  The patient was brought to the operating room  and, after adequate anesthesia had been obtained, the right knee was  examined.  She still has good stability, good motion, limited by  adiposity.  Good end point with Lachman's and drawer.  A tourniquet and  leg holder were applied.  The leg was prepped and draped in the usual  sterile fashion.  Three portals were created -- one superolateral, one  each medial and lateral para patellar.  Inflow catheter introduced.   The  knee was extended and the arthroscope was introduced and the knee  inspected.  Numerous chondral loose bodies were debrided.  Adhesions  anterior all debrided.  The patellofemoral joint grade II and III  chondromalacia pink in the patella debrided.  On the trochlea a small  grade IV area debrided at its margins.  Given the diffuse nature of  degenerative change, I did not feel further micro fracturing was  indicated.   ACL reconstruction intact.  Medial compartment grade II and III changes,  both sides, debrided.  Previous partial medial meniscectomy without any  tears.  Laterally tearing anterior half lateral meniscus removed to a  stable rim and tapered into remaining meniscus, salvaging some of the  posterior half and middle third.  Some focal grade IV changes on the  plateau but, for the most part, grade II and III changes there.  At  completion, all surfaces were examined, all made as smooth as possible.  All chondral loose bodies and fragments were removed.  The instruments  and fluid were removed.  The portals in the knee were injected with  Marcaine.  Portals _______.  A sterile compressive dressing was  applied.  Anesthesia was reversed.  The patient was brought to the recovery room.  The patient tolerated the surgery well with no complications.      Loreta Ave, M.D.  Electronically Signed     DFM/MEDQ  D:  08/31/2006  T:  09/01/2006  Job:  [id number]

## 2011-02-05 NOTE — Op Note (Signed)
NAMEGRIER, CZERWINSKI NO.:  [id number]   MEDICAL RECORD NO.:  [id number]          PATIENT TYPE:  AMB   LOCATION:  DSC                          FACILITY:  [facility]   PHYSICIAN:  Loreta Ave, M.D. DATE OF BIRTH:  May 03, 1976   DATE OF PROCEDURE:  11/26/2004  DATE OF DISCHARGE:                                 OPERATIVE REPORT   PREOPERATIVE DIAGNOSIS:  Left knee medial meniscus tear.   POSTOPERATIVE DIAGNOSIS:  Left knee medial meniscus tear with focal  chondromalacia medial femoral condyle and lateral patella with chondral  loose bodies.   PROCEDURE:  Left knee examination under anesthesia, arthroscopy with partial  medial meniscectomy.  Chondroplasty medial femoral condyle and patella.  Removal of loose bodies.   SURGEON:  Loreta Ave, M.D.   ASSISTANT:  Genene Churn. Denton Meek.   ANESTHESIA:  General anesthesia.   ESTIMATED BLOOD LOSS:  Minimal.   SPECIMENS:  None.   CULTURES:  None.   COMPLICATIONS:  None.   DRESSINGS:  Soft compressive.   DESCRIPTION OF PROCEDURE:  Patient brought to the operating room and placed  on the operating table in supine position.  After adequate anesthesia had  been obtained, left knee examined.  Good motion.  Previous lateral release  with good patellofemoral tracking, no tethering.  Little increased  encroachment with Lachman and drawer but with a good end point.  After  tourniquet and leg holder applied, prepped and draped in usual sterile  fashion.  Three portals created, one superolateral and one each medial and  lateral parapatellar.  Inflow catheter introduced to the knee.  Standard  arthroscope introduced.  Knee inspected.  Large effusion which was drained.  Numerous chondral loose bodies removed.  Patellofemoral joint had some focal  grade II and III changes lateral facet debrided.  Good tracking, no  tethering.  ACL a little increased excursion but still with a good end  point, no acute injury.  Lateral  meniscus, lateral compartment intact.  Medially complex tearing medial meniscus posterior half with some displaced  fragments in the back.  The posterior half removed, tapered into main  meniscus salvaging all of the anterior half and a little of the middle  third.  Tapered in smoothly.  At the very medial margin, medial femoral  condyle in full extension, there was a focal deep grade III 1 cm in  diameter.  Treated with chondroplasty to a stable surface.  Fortunately, the  majority of the cartilage in the compartment looked good and this was very  marginal.  At completion, the entire knee examined.  All chondral loose  bodies were removed.  No other findings appreciated.  Instruments and fluid removed.  Portals in the knee injected with Marcaine.  Portals closed with 4-0 nylon.  Sterile compressive dressing applied.  Anesthesia reversed.  Brought to recovery room.  Tolerated surgery well with  no complications.      DFM/MEDQ  D:  11/26/2004  T:  11/26/2004  Job:  [id number]

## 2011-02-05 NOTE — Op Note (Signed)
[place]. [facility]  Patient:    Ashley [name], Ashley [name] Visit Number: [id number] MRN: [id number]          Service Type: DSU Location: [facility] Attending Physician:  Colbert Ewing Dictated by:   Loreta Ave, M.D. Proc. Date: 02/01/02 Admit Date:  02/01/2002 Discharge Date: 02/02/2002                             Operative Report  PREOPERATIVE DIAGNOSIS:  Recalcitrant plantar fasciitis, left foot.  POSTOPERATIVE DIAGNOSIS:  Recalcitrant plantar fasciitis, left foot.  OPERATIVE PROCEDURE:  Endoscopic  plantar fascial release, left foot.  SURGEON:  Loreta Ave, M.D.  ASSISTANT:  Arlys John D. Petrarca, P.A.-C.  ANESTHESIA:  General.  BLOOD LOSS:  Minimal.  TOURNIQUET TIME:  30 min.  SPECIMENS:  None.  CULTURES:  None.  COMPLICATIONS:  None.  DRESSING:  Soft compressive.  DESCRIPTION OF PROCEDURE:  The patient was brought to the operating room and after adequate anesthesia had been obtained, a tourniquet was applied to the left calf.  Prepped and draped in the usual sterile fashion.  Exsanguinated with elevation Esmarch and tourniquet inflated to 250 mmHg.  With fluoroscopic guidance, the plantar fascial attachment of the os calceous was identified.  A small incision was made on the medial and lateral aspect of this.  The instrument used to spread tissue on the plantar side of the plantar fascia, and introduced across the foot.  The cannula was then inserted from medial to lateral, just plantar to the plantar fascia and distal to the os calceous attachment.  With arthroscopic guidance, the plantar fascia visualized in its entirety and then divided in its entirety with the arthroscopic knife.  There was complete release throughout, protecting neurovascular structures. Confirmation of complete release arthroscopically.  Wound irrigated; injected with Marcaine.  Portals were closed with nylon.  Sterile compressive dressing applied.   Tourniquet deflated and removed.  The anesthesia was reversed. Brought to the recovery room.  Tolerated surgery well without complications. Dictated by:   Loreta Ave, M.D. Attending Physician:  Colbert Ewing DD:  02/01/02 TD:  02/03/02 Job: [id number]

## 2011-02-05 NOTE — Op Note (Signed)
NAME:  Ashley [name], Ashley [name] NO.:  [id number]   MEDICAL RECORD NO.:  [id number]            PATIENT TYPE:   LOCATION:                               FACILITY:  [facility]   PHYSICIAN:  Loreta Ave, M.D.      DATE OF BIRTH:   DATE OF PROCEDURE:  01/14/2005  DATE OF DISCHARGE:                                 OPERATIVE REPORT   PREOPERATIVE DIAGNOSIS:  Right knee status post anterior cruciate ligament  reconstruction, intact graft, complex tearing medial and lateral meniscus,  status post previous partial medial meniscectomy.   POSTOPERATIVE DIAGNOSIS:  Right knee status post anterior cruciate ligament  reconstruction, intact graft, complex tearing medial and lateral meniscus,  status post previous partial medial meniscectomy, with progressive grade 3  degenerative chondromalacia patellofemoral joint and entire weightbearing  dome medial femoral condyle.   PROCEDURE:  Right knee exam under anesthesia, arthroscopy, debridement of  medial and lateral meniscus, removal of adhesions from the central notch,  chondroplasty medial femoral condyle and patella.   SURGEON:  Loreta Ave, M.D.   ASSISTANT:  Genene Churn. Denton Meek.   ANESTHESIA:  General.   BLOOD LOSS:  Minimal.   TOURNIQUET:  Not employed.   SPECIMENS:  None.   CULTURES:  None.   COMPLICATIONS:  None.   DRESSINGS:  Soft compressive.   PROCEDURE:  The patient was brought to the operating room and placed on the  operating table in the supine position.  After adequate anesthesia had been  obtained, right knee examined.  Full motion, good stability, reasonable  patellofemoral tracking.  Tourniquet and leg holder applied.  Leg prepped  and draped in the usual sterile fashion.  Three portals were created, one  superolateral, and one in each medial and lateral parapatellar.  Inflow  catheter induced, knee was distended, arthroscope introduced, and the knee  inspected.  Moderate  adhesions, especially  anteromedially, debrided.  Patellofemoral joint, good tracking.  Diffuse grade 2 and 3 changes on the  patella, treated with chondroplasty.  Trochlea looked good.  No tethering.  Lateral compartment had some grade 2 changes, not bad.  Complex tearing  posterior horn lateral meniscus, debrided out, retaining about half of the  posterior horn tapered into the remaining meniscus.  Cruciate ligament was  intact, including ACL graft.  There was a large adhesion centrally up in the  notch which I think was what was read on the MRI as a displaced meniscal  fragment.  All of that debrided, and an excellent look at the posterior horn  medial meniscus.  PCL intact.  ACL a little attenuated, but very intact  functional graft.  Medial meniscus had had a partial medial meniscectomy.  Further maceration and degenerative of what was left of the posterior horn  that was debrided out to a stable margin.  Also, flap tears off the anterior  third debrided out.  Most significantly, a deep grade 3 lesion 2 cm diameter  entire weightbearing dome medial femoral condyle.  Chondroplasty to a stable  surface.  Not down to bone, not requiring further microfracturing.  Entire  knee examined.  No other findings appreciated.  Instruments and fluid  removed.  Portals of the knee injected with Marcaine.  Portals closed with 4-  0 nylon.  Sterile compressive dressing applied.  Anesthesia reversed.  Brought to the recovery room.  Tolerated surgery well.  No complications.      DFM/MEDQ  D:  01/14/2005  T:  01/14/2005  Job:  [id number]

## 2011-02-05 NOTE — Op Note (Signed)
[name]  Ashley, [name] NO.:  [id number]   MEDICAL RECORD NO.:  [id number]                   PATIENT TYPE:  AMB   LOCATION:  DSC                                  FACILITY:  [facility]   PHYSICIAN:  Loreta Ave, M.D.              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   POSTOPERATIVE DIAGNOSES:  1. 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  2. Complex attritional tearing lateral meniscus.  3. Previous anterior cruciate ligament reconstruction with partial medial     and lateral meniscectomies in the past.  4. Intact anterior cruciate ligament graft.   OPERATION PERFORMED:  Right knee examination under anesthesia, arthroscopy,  chondroplasty, all three compartments especially at the patellofemoral  joint, partial lateral meniscectomy.  Arthroscopic lateral retinacular  release.   SURGEON:  Loreta Ave, M.D.   ASSISTANT:  Arlys John D. Petrarca, P.A.-C.   ANESTHESIA:  General.   ESTIMATED BLOOD LOSS:  Minimal.   SPECIMENS:  None.   CULTURES:  None.   COMPLICATIONS:  None.   DRESSING:  Soft compressive with lateral bolster.   TOURNIQUET TIME:  Tourniquet not employed.   DESCRIPTION OF PROCEDURE:  The patient was brought to the operating room and  placed on the operating table in supine position.  After adequate anesthesia  had been obtained, the right knee examined.  Lateral tracking, tethering and  crepitus, patellofemoral joint.  Otherwise full motion, stable knee  including negative Lachman and drawer.  Tourniquet and leg holder applied.  Leg prepped and draped in the usual sterile fashion.   Three portals were  created, one superolateral, one each medial and lateral parapatellar.  Inflow catheter introduced.  Knee distended.  Arthroscope introduced, knee  inspected.  Marked lateral tracking, tethering and lateral facet patellar  chondromalacia, grade 3.  Chondroplasty throughout.  Confirmation of  tethering and lateral tracking viewing from all portals.  Anterior scars and  adhesive debris.  Medial compartment had some isolated grade 2 changes on  the condyle debrided.  Previous partial medial meniscectomy without  recurrent tears.  ACL and PCL intact.  Lateral compartment had intrameniscal  cleavage tearing middle third that was saucerized out, tapered into the  remaining meniscus.  Unfortunately, some diffuse thinning lateral  compartment, grade 3 changes and even one small focal grade 4 area on the  plateau.  All ________ examined, all loose bodies removed, all uneven  surfaces debrided.  Adequate lateral meniscectomy confirmed viewing from all  portals.  Lateral release then performed with cautery from the vastus  lateralis superiorly all the way down to the lateral joint line.  With this,  we had good hemostasis with cautery.  Good chondroplasty on the patella and  excellent improvement of the tethering at the patellofemoral joint after  lateral release confirming viewing from all portals.  Instruments and fluid  removed.  Portals of the knee injected with Marcaine.  Portals closed with 4-  0 nylon.  Sterile compressive dressing with lateral bolster applied.  Anesthesia reversed.  Brought to recovery room.  Tolerated surgery well.  No  complications.                                                  Loreta Ave, M.D.    DFM/MEDQ  D:  03/07/2003  T:  03/09/2003  Job:  [id number]

## 2011-02-05 NOTE — Discharge Summary (Signed)
[name]  Ashley [name], Ashley [name] NO.:  [id number]   MEDICAL RECORD NO.:  [id number]           PATIENT TYPE:   LOCATION:                                 FACILITY:   PHYSICIAN:  Loreta Ave, M.D.      DATE OF BIRTH:   DATE OF ADMISSION:  08/21/2008  DATE OF DISCHARGE:  08/26/2008                               DISCHARGE SUMMARY   FINAL DIAGNOSES:  1. Status post right total knee replacement for end-stage degenerative      joint disease.  2. Removal of tibial and femoral screws.   HISTORY OF PRESENT ILLNESS:  A 35 year old black female with a history  of end-stage DJD and right knee retained hardware who presented to our  office for preop evaluation for total knee replacement.  She had  progressive worsening pain with no response to conservative treatment.  Significant decrease in her daily activities due to the ongoing  complaint.   HOSPITAL COURSE:  On August 21, 2008, the patient was taken to the  [facility] OR and a right total knee replacement procedure was  performed.  Surgeon, Mckinley Jewel, MD and assistant is Zonia Kief, PA-  C.  Anesthesia general.  No specimens.  EBL minimal.  Tourniquet time 2  hours.  One Hemovac drain placed.  There are no No surgical or  anesthesia complications and the patient was transferred to recovery in  stable condition.  Pharmacy protocol, Coumadin started.  August 22, 2008, the patient complained of right knee pain.  Vital signs stable and  afebrile.  Hemoglobin 11.7, hematocrit 35.1, INR 1.10.  Dressing clean,  dry, and intact.  Calf nontender and neurovascularly intact.  PT and OT  consults.  On August 23, 2008, the patient complained of right knee  pain.  No complaints of chest pain or shortness of breath.  Temperature  99.2, pulse 104, respirations 18, blood pressure 120/69.  Hemoglobin  10.6, INR 1.7, sodium 134, potassium 3.9, chloride 100, CO2 27, BUN 5,  creatinine 0.96, glucose 132.  The wound looks good and staples  were  intact.  There were no signs of infection.  Hemovac drain discontinued.  Moderate proximal right calf tenderness on palpation.  Positive Homans.  Neurovascularly intact.  Foley and IV discontinued.  Ordered a venous  Doppler of right lower extremity.  Change of Percocet to 10/325 1-2  tablets p.o. q.4-6 h. p.r.n. for pain.  Late in the afternoon, bilateral  venous Doppler studies were negative for DVT.  Resumed a PT.  On  August 24, 2008, the patient again complained of knee pain.  Vital  signs stable and afebrile.  Wound looks good and staples intact.  There  was no drainage or signs of infection.  On August 25, 2008, the patient  complained of knee pain and nausea.  Vital signs stable and afebrile.  Knee wound looks good and staples intact.  There was no drainage or  signs of infection.  Started on Phenergan p.o.  On August 26, 2008, the  patient doing well, but complaining of itching.  Pain controlled.  Good  bowel movement now.  Did great with therapy.  States that she is ready  to home.  Temperature 97.7, pulse 101, respirations 20, blood pressure  121/74.  WBC 8.1, hematocrit 28.6, hemoglobin 9.4, platelets 192.  Sodium 138, potassium 3.8, chloride 104, CO2 30, BUN 8, creatinine 0.77,  glucose 108, INR 1.8.  Knee wound looks good and staples intact.  No  drainage or signs of infection.  Calf nontender and neurovascularly  intact.  Itching likely was due to Percocet.  Change pain med to  Dilaudid.   CONDITION:  Good and stable.   DISPOSITION:  Discharge home.   MEDICATIONS:  1. Dilaudid 2 mg 1 tablet p.o. q.4-6 h. p.r.n. for pain.  2. Robaxin 500 mg 1 tablet p.o. q.6 h. p.r.n. for spasms.  3. Coumadin pharmacy protocol.  4. Lovenox 30 mg one subcu injection b.i.d. x3 days and stop when      Coumadin is therapeutic.   INSTRUCTIONS:  The patient will work with a home health, PT, and OT to  improve ambulation and knee range of motion and strengthening.  Weight  bear as  tolerated.  Daily dressing changes with 4x4 gauze and tape.  She  will follow up in the office when she is 2 weeks postop for recheck.  Return sooner if needed.      Genene Churn. Denton Meek.      Loreta Ave, M.D.  Electronically Signed    JMO/MEDQ  D:  11/11/2008  T:  11/12/2008  Job:  [id number]

## 2011-02-05 NOTE — Op Note (Signed)
[name]  HILDRETH, ORSAK NO.:  [id number]   MEDICAL RECORD NO.:  [id number]                   PATIENT TYPE:  AMB   LOCATION:  [facility]                                  FACILITY:  [facility]   PHYSICIAN:  Loreta Ave, M.D.              DATE OF BIRTH:  09/07/1976   DATE OF PROCEDURE:  DATE OF DISCHARGE:                                 OPERATIVE REPORT   PREOPERATIVE DIAGNOSES:  1. Fixed claw-toe, 2nd toe, left foot.  2. 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   POSTOPERATIVE DIAGNOSIS:  1. Fixed claw-toe, 2nd toe, left foot.  2. Dynamic claw-toe, 3rd and 4th toe, left foot.   PROCEDURE:  1. Correction claw-toe, 2nd toe with shortening and PIP fusion.  2. Correction dynamic claw-toe 3rd and 4th toes with flexor advancement to     extensor position, tendon transfer.   ANESTHESIA:  General.   ESTIMATED BLOOD LOSS:  Minimal.   TOURNIQUET TIME:  45 minutes.   SPECIMENS:  None.   COMPLICATIONS:  None.   DRESSING:  Sterile compressive with a wooden shoe.   DESCRIPTION OF PROCEDURE:  The patient was brought to the operating room and  placed on the operating table in the supine position. After adequate  anesthesia had been obtained the tourniquet was applied to the left calf.  The left foot was prepped and draped in the usual sterile fashion.  Exsanguinated with elevation and the Esmarch tourniquet was inflated  to 250  mmHg.   The foot was examined. A significantly fixed, flexed claw-toe, 2nd toe PIP  joint without significant deformities of other joints. Dynamic clawing of  the 3rd and 4th toes at the PIP joint. Attention was turned to the 2nd toe.   An elliptical incision through the skin and extensor tendon and then  resection of the PIP joint on both sides down to good cancellous bone with  adequate shortening to correct the soft tissue contracture. A buried 0.062 K-  wire was placed in the proximal middle phalanx and the joint was reduced  to  a nice corrected position with good bony opposition. Nylon was used to  reapproximate the skin and the extensor tendon was then tied down with a  bolster to hold this in a corrected position. Further soft tissue correction  was not necessary.   Attention was turned to the 3rd and 4th toes. A Z-incision was made on the  plantar aspect of both toes, exposing the flexor tendon which was split  longitudinally. Both slips were then captured with Ethibond suture and  routed from the flexor to the dorsal and then back to the flexor side,  wrapping around the middle phalanx and firmly tied down in place on both  toes. This yielded a nice correction of the soft tissue clawing of both  these toes into a good anatomic position. No sufficient contraction of the  other joints warrant anything further.   All wounds were  irrigated and closed with nylon. A sterile compressive  dressing was applied. Marcaine block without epinephrine digitally at  completion.   Once the dressing was in place the tourniquet was deflated. A wooden shoe  was applied. Anesthesia was reversed. The patient was taken to the recovery  room. She tolerated the surgery well without complications.                                               Loreta Ave, M.D.    DFM/MEDQ  D:  01/10/2003  T:  01/11/2003  Job:  [id number]

## 2011-02-05 NOTE — Op Note (Signed)
   NAMEMarland [name]  REJEANA, FADNESS NO.:  [id number]   MEDICAL RECORD NO.:  [id number]                   PATIENT TYPE:  AMB   LOCATION:  DSC                                  FACILITY:  [facility]   PHYSICIAN:  Loreta Ave, M.D.              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   PROCEDURES:  Left knee examination under anesthesia, arthroscopy,  replacement of patella, removal of chondral loose bodies, and lateral  retinacular release.   SURGEON:  Loreta Ave, M.D.   ASSISTANT:  Arlys John D. Petrarca, P.A.-C.   ANESTHESIA:  General.   ESTIMATED BLOOD LOSS:  Minimal.   TOURNIQUET:  Not employed.   SPECIMENS:  None.   CULTURES:  None.   COMPLICATIONS:  None.   DRESSING:  Soft compressive with lateral bolster.   PROCEDURE:  Patient brought to the operating room and placed on the  operating table in supine position.  After adequate anesthesia had been  obtained, left knee examined.  Lateral tracking, lateral tethering, and  crepitus, patellofemoral joint.  Otherwise full motion and stable ligaments.  Tourniquet and leg holder applied, leg prepped and draped in the usual  sterile fashion.  Three portals created, one superolateral, one each medial  and lateral parapatellar.  Inflow catheter introduced and knee distended,  arthroscope introduced, and the knee inspected.  Focal grade 2-3  chondromalacia of the lateral patellar facet with fissuring.  Chondral loose  bodies all removed.  Chondroplasty to smooth the patella.  Confirmation of  lateral tracking and tethering.  Medical meniscus, lateral meniscus, medial  and  lateral compartments, cruciate ligaments all otherwise looked good.  After completion of chondroplasty, proceeded with lateral release.  The  lateral retinaculum was incised with cautery from the vastus lateralis  superiorly to the lateral joint line.  Marked improvement of tethering and  improvement of tracking  confirmed.  Instruments and fluid removed.  Portals and knee injected with  Marcaine.  Portals closed with 4-0 nylon.  Sterile compressive dressing  applied.  Anesthesia reversed.  Brought to the recovery room.  Tolerated the  surgery well with no complications.                                                 Loreta Ave, M.D.    DFM/MEDQ  D:  03/21/2003  T:  03/22/2003  Job:  [id number]

## 2011-02-25 ENCOUNTER — Ambulatory Visit (INDEPENDENT_AMBULATORY_CARE_PROVIDER_SITE_OTHER): Payer: 59 | Admitting: Women's Health

## 2011-02-25 DIAGNOSIS — Z113 Encounter for screening for infections with a predominantly sexual mode of transmission: Secondary | ICD-10-CM

## 2011-02-25 DIAGNOSIS — B373 Candidiasis of vulva and vagina: Secondary | ICD-10-CM

## 2011-02-25 DIAGNOSIS — N898 Other specified noninflammatory disorders of vagina: Secondary | ICD-10-CM

## 2011-02-25 DIAGNOSIS — B3731 Acute candidiasis of vulva and vagina: Secondary | ICD-10-CM

## 2011-03-21 ENCOUNTER — Inpatient Hospital Stay (HOSPITAL_COMMUNITY)
Admission: AD | Admit: 2011-03-21 | Discharge: 2011-03-21 | Disposition: A | Discharge status: Home / Self Care | Payer: 59 | Source: Ambulatory Visit | Attending: Obstetrics and Gynecology | Admitting: Obstetrics and Gynecology

## 2011-03-21 DIAGNOSIS — IMO0002 Reserved for concepts with insufficient information to code with codable children: Secondary | ICD-10-CM

## 2011-03-21 DIAGNOSIS — O99891 Other specified diseases and conditions complicating pregnancy: Secondary | ICD-10-CM | POA: Insufficient documentation

## 2011-03-21 DIAGNOSIS — O9989 Other specified diseases and conditions complicating pregnancy, childbirth and the puerperium: Secondary | ICD-10-CM

## 2011-03-21 LAB — URINALYSIS, ROUTINE W REFLEX MICROSCOPIC
Bilirubin Urine: NEGATIVE
Glucose, UA: NEGATIVE mg/dL
Hgb urine dipstick: NEGATIVE
Ketones, ur: 15 mg/dL — AB
Leukocytes, UA: NEGATIVE
Nitrite: NEGATIVE
Protein, ur: NEGATIVE mg/dL
Specific Gravity, Urine: >1.03 — ABNORMAL HIGH (ref 1.005–1.030)
Urobilinogen, UA: 2 mg/dL — ABNORMAL HIGH (ref 0.0–1.0)
pH: 6 (ref 5.0–8.0)

## 2011-03-25 ENCOUNTER — Ambulatory Visit (HOSPITAL_COMMUNITY)
Admission: RE | Admit: 2011-03-25 | Discharge: 2011-03-25 | Disposition: A | Discharge status: Home / Self Care | Payer: 59 | Source: Ambulatory Visit | Attending: Gynecology | Admitting: Gynecology

## 2011-03-29 ENCOUNTER — Encounter (INDEPENDENT_AMBULATORY_CARE_PROVIDER_SITE_OTHER): Payer: 59 | Admitting: Surgery

## 2011-03-29 DIAGNOSIS — I824Z9 Acute embolism and thrombosis of unspecified deep veins of unspecified distal lower extremity: Secondary | ICD-10-CM

## 2011-03-30 NOTE — Assessment & Plan Note (Signed)
OFFICE VISIT  HAYLA, HINGER DOB:  1976-06-23                                       03/29/2011 WUJWJ#:[id number]  PRIMARY CARE PHYSICIAN:  Claude Manges, NP.  REASON FOR VISIT:  DVT.  HISTORY:  This is a 35 year old female I am seeing at the request of Dr. Herbie Baltimore for evaluation of DVT.  The patient states that in December she developed a left leg DVT.  She was treated with 6 months of Coumadin and is no longer on Coumadin.  She underwent a thorough workup to evaluate the underlying etiology.  Hematology consult was obtained.  All blood tests were negative.  She was on the NuvaRing at that time.  She does not smoke.  She had not been on any prolonged position.  She is in a sedentary job and sits down most of the day.  The patient is currently off her Coumadin.  She is now pregnant.  REVIEW OF SYSTEMS:  VASCULAR:  As above. MUSCULOSKELETAL:  Positive for arthritis, joint pain, muscle pain.  PAST MEDICAL HISTORY:  Total knee replacement 2009 and 2011.  SOCIAL HISTORY:  She has 1 child.  Works in [employer] as a Scientist, product/process development person.  Does not drink or smoke.  FAMILY HISTORY:  Positive for congestive heart failure in father and sister.  Sister had a transplant at age 35.  MEDICATIONS:  Include bee pollen, Prozac, Neurontin.  ALLERGIES:  Codeine.  PHYSICAL EXAMINATION:  Vital signs:  Heart rate 92, blood pressure 126/80, respiratory rate 20.  General:  She is well-appearing, in no distress.  HEENT:  Within normal limits.  Respirations:  Nonlabored. Cardiovascular:  Palpable pedal pulses.  Abdomen:  Obese but soft. Neurological:  No focal deficit.  Musculoskeletal:  1+ pitting edema left leg.  Skin:  No rash or ulcers.  DIAGNOSTIC STUDIES:  I have reviewed the duplex study from [facility] that shows chronic DVT in a distal superficial femoral and popliteal vein on the left.  ASSESSMENT:  Left leg DVT (deep venous  thrombosis).  PLAN:  I reiterated to the patient that this far out from her DVT there is no procedure recommended for vein recanalization or clot removal.  We did discuss that thrombolysis can be performed, however, this is usually done within the first 2-3 weeks of her initial presentation and is based on the severity of her symptoms.  More importantly, however, is the question as to whether or not she needs to be on anticoagulation for her pregnancy.  She has certainly completed 6 months of Coumadin therapy and has no underlying explanation for DVT so I do not think it would be unreasonable to not place her on heparin.  However, in the setting of her pregnancy and having just had a DVT I would probably err on side of caution and treat her with heparin products for the duration of her pregnancy.  This would be my formal recommendation and I will leave the dosing to her ob/gyn.    Jorge Ny, MD Electronically Signed  VWB/MEDQ  D:  03/29/2011  T:  03/30/2011  Job:  [id number]  cc:   Claude Manges, NP Landry Corporal, MD

## 2011-03-31 ENCOUNTER — Other Ambulatory Visit (HOSPITAL_COMMUNITY): Payer: Self-pay | Admitting: Obstetrics and Gynecology

## 2011-03-31 MED ORDER — ENOXAPARIN SODIUM 120 MG/0.8ML ~~LOC~~ SOLN: 120.0000 mg | Freq: Two times a day (BID) | SUBCUTANEOUS | Status: DC

## 2011-04-02 ENCOUNTER — Encounter (HOSPITAL_COMMUNITY): Payer: Self-pay | Admitting: Nurse Practitioner

## 2011-04-02 ENCOUNTER — Ambulatory Visit: Admit: 2011-04-02 | Payer: Self-pay | Admitting: Obstetrics and Gynecology

## 2011-04-02 ENCOUNTER — Encounter (HOSPITAL_COMMUNITY): Payer: Self-pay | Admitting: Anesthesiology

## 2011-04-02 ENCOUNTER — Inpatient Hospital Stay (HOSPITAL_COMMUNITY): Payer: 59 | Admitting: Anesthesiology

## 2011-04-02 ENCOUNTER — Encounter (HOSPITAL_COMMUNITY)
Admission: AD | Disposition: A | Discharge status: Home / Self Care | Payer: Self-pay | Source: Ambulatory Visit | Attending: Obstetrics and Gynecology

## 2011-04-02 ENCOUNTER — Inpatient Hospital Stay (HOSPITAL_COMMUNITY)
Admission: AD | Admit: 2011-04-02 | Discharge: 2011-04-02 | Disposition: A | Discharge status: Home / Self Care | Payer: 59 | Source: Ambulatory Visit | Attending: Obstetrics and Gynecology | Admitting: Obstetrics and Gynecology

## 2011-04-02 ENCOUNTER — Other Ambulatory Visit: Payer: Self-pay | Admitting: Obstetrics and Gynecology

## 2011-04-02 DIAGNOSIS — O021 Missed abortion: Secondary | ICD-10-CM | POA: Insufficient documentation

## 2011-04-02 DIAGNOSIS — O034 Incomplete spontaneous abortion without complication: Secondary | ICD-10-CM

## 2011-04-02 DIAGNOSIS — I82409 Acute embolism and thrombosis of unspecified deep veins of unspecified lower extremity: Secondary | ICD-10-CM

## 2011-04-02 HISTORY — PX: DILATION AND EVACUATION: SHX1459

## 2011-04-02 LAB — CBC
HCT: 37.7 % (ref 36.0–46.0)
Hemoglobin: 12.2 g/dL (ref 12.0–15.0)
MCH: 25.7 pg — ABNORMAL LOW (ref 26.0–34.0)
MCHC: 32.4 g/dL (ref 30.0–36.0)
MCV: 79.5 fL (ref 78.0–100.0)
Platelets: 206 10*3/uL (ref 150–400)
RBC: 4.74 MIL/uL (ref 3.87–5.11)
RDW: 16.5 % — ABNORMAL HIGH (ref 11.5–15.5)
WBC: 9.1 10*3/uL (ref 4.0–10.5)

## 2011-04-02 SURGERY — DILATION AND EVACUATION, UTERUS
Anesthesia: Monitor Anesthesia Care | Site: Uterus | Wound class: Clean Contaminated

## 2011-04-02 MED ORDER — OXYCODONE-ACETAMINOPHEN 5-325 MG PO TABS
1.0000 | ORAL_TABLET | ORAL | Status: DC | PRN
  Administered 2011-04-02: 1 via ORAL

## 2011-04-02 MED ORDER — KETOROLAC TROMETHAMINE 30 MG/ML IJ SOLN
INTRAMUSCULAR | Status: DC | PRN
  Administered 2011-04-02: 30 mg via INTRAVENOUS

## 2011-04-02 MED ORDER — LIDOCAINE HCL 2 % EX GEL
CUTANEOUS | Status: DC | PRN
  Administered 2011-04-02: 60 via TOPICAL

## 2011-04-02 MED ORDER — PROPOFOL 10 MG/ML IV EMUL
INTRAVENOUS | Status: AC
  Filled 2011-04-02: qty 20

## 2011-04-02 MED ORDER — KETOROLAC TROMETHAMINE 30 MG/ML IJ SOLN
INTRAMUSCULAR | Status: AC
  Filled 2011-04-02: qty 1

## 2011-04-02 MED ORDER — FAMOTIDINE IN NACL 20-0.9 MG/50ML-% IV SOLN
INTRAVENOUS | Status: AC
  Administered 2011-04-02: 20 mg via INTRAVENOUS
  Filled 2011-04-02: qty 50

## 2011-04-02 MED ORDER — OXYCODONE-ACETAMINOPHEN 5-325 MG PO TABS
ORAL_TABLET | ORAL | Status: AC
  Administered 2011-04-02: 1 via ORAL
  Filled 2011-04-02: qty 1

## 2011-04-02 MED ORDER — PROPOFOL 10 MG/ML IV EMUL
INTRAVENOUS | Status: DC | PRN
  Administered 2011-04-02 (×3): 50 mg via INTRAVENOUS
  Administered 2011-04-02: 100 mg via INTRAVENOUS

## 2011-04-02 MED ORDER — ONDANSETRON HCL 4 MG/2ML IJ SOLN
INTRAMUSCULAR | Status: AC
  Filled 2011-04-02: qty 2

## 2011-04-02 MED ORDER — LACTATED RINGERS IV SOLN
INTRAVENOUS | Status: DC | PRN
  Administered 2011-04-02: 16:00:00 via INTRAVENOUS

## 2011-04-02 MED ORDER — ONDANSETRON HCL 4 MG/2ML IJ SOLN
INTRAMUSCULAR | Status: DC | PRN
  Administered 2011-04-02: 4 mg via INTRAVENOUS

## 2011-04-02 MED ORDER — LIDOCAINE HCL (CARDIAC) 20 MG/ML IV SOLN
INTRAVENOUS | Status: AC
  Filled 2011-04-02: qty 5

## 2011-04-02 MED ORDER — CEFOXITIN SODIUM-DEXTROSE 1-4 GM-% IV SOLR (PREMIX)
1.0000 g | Freq: Once | INTRAVENOUS | Status: AC
  Administered 2011-04-02: 1 g via INTRAVENOUS
  Filled 2011-04-02: qty 50

## 2011-04-02 MED ORDER — LIDOCAINE-EPINEPHRINE 1 %-1:100000 IJ SOLN
INTRAMUSCULAR | Status: DC | PRN
  Administered 2011-04-02: 10 mL via INTRADERMAL

## 2011-04-02 MED ORDER — MIDAZOLAM HCL 5 MG/5ML IJ SOLN
INTRAMUSCULAR | Status: DC | PRN
  Administered 2011-04-02: 2 mg via INTRAVENOUS

## 2011-04-02 MED ORDER — METOCLOPRAMIDE HCL 5 MG/ML IJ SOLN
INTRAMUSCULAR | Status: AC
  Administered 2011-04-02: 10 mg via INTRAVENOUS
  Filled 2011-04-02: qty 2

## 2011-04-02 MED ORDER — LACTATED RINGERS IV SOLN
INTRAVENOUS | Status: DC
  Administered 2011-04-02 (×2): via INTRAVENOUS

## 2011-04-02 MED ORDER — ENOXAPARIN SODIUM 120 MG/0.8ML ~~LOC~~ SOLN: 120.0000 mg | Freq: Two times a day (BID) | SUBCUTANEOUS | Status: DC

## 2011-04-02 MED ORDER — FENTANYL CITRATE 0.05 MG/ML IJ SOLN
INTRAMUSCULAR | Status: DC | PRN
  Administered 2011-04-02 (×2): 50 ug via INTRAVENOUS

## 2011-04-02 SURGICAL SUPPLY — 20 items
CATH ROBINSON RED A/P 16FR (CATHETERS) ×2 IMPLANT
CLOTH BEACON ORANGE TIMEOUT ST (SAFETY) ×2 IMPLANT
DECANTER SPIKE VIAL GLASS SM (MISCELLANEOUS) IMPLANT
DRAPE UTILITY XL STRL (DRAPES) ×2 IMPLANT
GLOVE BIO SURGEON STRL SZ8 (GLOVE) ×2 IMPLANT
GLOVE SURG ORTHO 8.0 STRL STRW (GLOVE) ×2 IMPLANT
GOWN BRE IMP SLV AUR LG STRL (GOWN DISPOSABLE) ×2 IMPLANT
GOWN BRE IMP SLV AUR XL STRL (GOWN DISPOSABLE) ×2 IMPLANT
KIT BERKELEY 1ST TRIMESTER 3/8 (MISCELLANEOUS) ×2 IMPLANT
NEEDLE SPNL 22GX3.5 QUINCKE BK (NEEDLE) ×2 IMPLANT
NS IRRIG 1000ML POUR BTL (IV SOLUTION) ×2 IMPLANT
PACK VAGINAL MINOR WOMEN LF (CUSTOM PROCEDURE TRAY) ×2 IMPLANT
PAD PREP 24X48 CUFFED NSTRL (MISCELLANEOUS) ×2 IMPLANT
SET BERKELEY SUCTION TUBING (SUCTIONS) ×2 IMPLANT
SYR CONTROL 10ML LL (SYRINGE) ×2 IMPLANT
TOWEL OR 17X24 6PK STRL BLUE (TOWEL DISPOSABLE) ×4 IMPLANT
VACURETTE 10 RIGID CVD (CANNULA) IMPLANT
VACURETTE 7MM CVD STRL WRAP (CANNULA) IMPLANT
VACURETTE 8 RIGID CVD (CANNULA) ×4 IMPLANT
VACURETTE 9 RIGID CVD (CANNULA) IMPLANT

## 2011-04-02 NOTE — ED Provider Notes (Signed)
History   Patient here for D&C due to incomplete miscarriage. Awaiting surgery.  Chief Complaint  Patient presents with  . Miscarriage   The history is provided by the patient.    OB History    Grav Para Term Preterm Abortions TAB SAB Ect Mult Living                  Past Medical History  Diagnosis Date  . PCOS (polycystic ovarian syndrome)   . Endometrial polyp   . Infertility   . DVT (deep venous thrombosis) 08/26/2010    LEFT LEG    Past Surgical History  Procedure Date  . Knee surgery     X4-5  . Ankle surgery     X2  . 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 Dilation and curettage of uterus 2011    HYSTEROSCOPY, D&C  . Hysteroscopy 2011    HYSTEROSCOPY, D&C    Family History  Problem Relation Age of Onset  . Diabetes Father   . Cancer Father     COLON  . Heart disease Father   . Diabetes Sister   . Cancer Maternal Grandmother     UTERINE    History  Substance Use Topics  . Smoking status: Unknown If Ever Smoked  . Smokeless tobacco: Not on file  . Alcohol Use: No    Allergies:  Allergies  Allergen Reactions  . Codeine Itching and Nausea Only    Prescriptions prior to admission  Medication Sig Dispense Refill  . prenatal vitamin w/FE, FA (PRENATAL 1 + 1) 27-1 MG TABS Take 1 tablet by mouth daily.          ROS Physical Exam   Blood pressure 121/70, pulse 87, temperature 98.9 F (37.2 C), temperature source Oral, resp. rate 20, height 6' (1.829 m), weight 285 lb (129.275 kg), SpO2 98.00%.  Physical Exam  Nursing note and vitals reviewed. Constitutional: She appears well-developed. No distress.    MAU Course  Procedures  MDM Pt. Awaiting OR, stable. IV infusing, pt. NPO.   [place], [place] 04/02/11 [phone]

## 2011-04-02 NOTE — Anesthesia Preprocedure Evaluation (Addendum)
Anesthesia Evaluation  Name, MR# and DOB Patient awake  General Assessment Comment  Reviewed: Allergy & Precautions, H&P  and Patient's Chart, lab work & pertinent test results  Airway Mallampati: III TM Distance: >3 FB Neck ROM: Full    Dental No notable dental hx (+) Teeth Intact   Pulmonaryneg pulmonary ROS      pulmonary exam normal   Cardiovascular Regular Normal   Neuro/PsychNegative Neurological ROS Negative Psych ROS  GI/Hepatic/Renal negative GI ROS, negative Liver ROS, and negative Renal ROS (+)       Endo/Other  Negative Endocrine ROS (+)   Abdominal Normal abdominal exam  (+)   Musculoskeletal negative musculoskeletal ROS (+)  Hematology negative hematology ROS (+)   Peds negative pediatric ROS (+)  Reproductive/Obstetrics negative OB ROS   Anesthesia Other Findings             Anesthesia Physical Anesthesia Plan  ASA: III and Emergent  Anesthesia Plan: MAC   Post-op Pain Management:    Induction: Intravenous  Airway Management Planned: Mask  Additional Equipment:   Intra-op Plan:   Post-operative Plan:   Informed Consent: I have reviewed the patients History and Physical, chart, labs and discussed the procedure including the risks, benefits and alternatives for the proposed anesthesia with the patient or authorized representative who has indicated his/her understanding and acceptance.   Dental advisory given  Plan Discussed with: Anesthesiologist (AP) and CRNA  Anesthesia Plan Comments:        Anesthesia Quick Evaluation

## 2011-04-02 NOTE — Transfer of Care (Signed)
Immediate Anesthesia Transfer of Care Note  Patient: Ashley [name]  Procedure(s) Performed:  DILATATION AND EVACUATION (D&E) - dvt left mid thigh  Patient Location: PACU  Anesthesia Type: MAC  Level of Consciousness: awake, alert  and oriented  Airway & Oxygen Therapy: Patient Spontanous Breathing  Post-op Assessment: Report given to PACU RN  Post vital signs: Reviewed and stable  Complications: No apparent anesthesia complications

## 2011-04-02 NOTE — Procedures (Signed)
D&E for Missed ab, and chronic DVT No complications EBL minimal Dictated DL

## 2011-04-02 NOTE — Progress Notes (Signed)
Sent from office.  Dx with missed ab in office

## 2011-04-02 NOTE — Anesthesia Postprocedure Evaluation (Signed)
  Anesthesia Post-op Note  Patient: Ashley [name]  Procedure(s) Performed:  DILATATION AND EVACUATION (D&E) - dvt left mid thigh  Patient Location: PACU  Anesthesia Type: MAC  Level of Consciousness: awake, alert  and oriented  Airway and Oxygen Therapy: Patient Spontanous Breathing  Post-op Pain: none  Post-op Assessment: Post-op Vital signs reviewed, Patient's Cardiovascular Status Stable, Respiratory Function Stable, Patent Airway, No signs of Nausea or vomiting and Pain level controlled  Post-op Vital Signs: Reviewed and stable  Complications: No apparent anesthesia complications

## 2011-04-02 NOTE — Addendum Note (Signed)
Addendum  created 04/02/11 1825 by Tyrone Apple. [employer] edited:PRL Based Order Sets

## 2011-04-03 NOTE — Op Note (Signed)
[name]  CARNETTA, LOSADA NO.:  [id number]  MEDICAL RECORD NO.:  [id number]  LOCATION:  [facility]                          FACILITY:  [facility]  PHYSICIAN:  Dineen Kid. Rana Snare, M.D.    DATE OF BIRTH:  [birth year]  DATE OF PROCEDURE:  04/02/2011 DATE OF DISCHARGE:  04/02/2011                              OPERATIVE REPORT   PREOPERATIVE DIAGNOSES:  Missed abortion at 51 weeks' gestational age, also patient with chronic deep vein thrombosis.  POSTOPERATIVE DIAGNOSES:  Missed abortion at 33 weeks' gestational age, also patient with chronic deep vein thrombosis.  PROCEDURE:  Dilation and evacuation.  SURGEON:  Dr. Candice Camp.  ANESTHESIA:  Monitored anesthetic care and paracervical block.  INDICATIONS:  [name] is a 35 year old with a known missed abortion.  She was evaluated today by Dr. Tona Sensing who called and reported that she has a confirmed nonviable pregnancy.  The patient has been noncompliant with her anticoagulation medications.  She has a known popliteal deep vein thrombosis.  He recommended doing a dilation and evacuation as soon as possible and then beginning anticoagulation and having her followed up with her pain specialist who is monitoring this. The risks and benefits of procedure were discussed at length with the patient and her family which include but not limited to risk of bleeding, infection, damage to the uterus, tubes ovary, bowel-bladder also the importance of being compliant with the anticoagulation and following up as scheduled.  She does give her informed consent and wished to proceed.  DESCRIPTION OF PROCEDURE:  After adequate analgesia, the patient was placed in the dorsal lithotomy position.  She was sterilely prepped and draped.  The bladder was sterilely drained.  Graves speculum was placed. Tenaculum was placed on the anterior lip of the cervix.  A paracervical block was placed, 1% Xylocaine with 1:100,000 epinephrine.  Total of 20 mL  used.  Uterus sounded to 10 cm, easily dilated to #31 Pratt dilator, 8-mm suction curette was inserted.  Products of conception were retrieved.  Suction curettage performed until a gritty surface felt throughout the endometrial cavity and no residual products returned. Once it was felt the uterine cavity was empty, the curette was removed. Tenaculum was removed from the anterior cervix, noted be hemostatic. The patient was then transferred to recovery room in stable condition. Sponge and instrument count was normal x3.  Estimated blood loss was minimal.  The patient received 1 gram of cefotetan preoperatively.  She is known B+.  DISPOSITION:  The patient discharged home.  She will follow up in the office in 1 week.  Sent her home with the routine instruction sheet for D and E.  Told to return for increased pain, fever, or bleeding.  Also sent her home with a prescription for Lovenox 120 mg to be given subcu beginning of 4 hours and twice a day until she is seen by her specialist for the DVT.     Dineen Kid Rana Snare, M.D.     DCL/MEDQ  D:  04/02/2011  T:  04/03/2011  Job:  [id number]

## 2011-04-09 ENCOUNTER — Encounter (HOSPITAL_BASED_OUTPATIENT_CLINIC_OR_DEPARTMENT_OTHER): Payer: 59 | Admitting: Oncology

## 2011-04-09 DIAGNOSIS — I824Z9 Acute embolism and thrombosis of unspecified deep veins of unspecified distal lower extremity: Secondary | ICD-10-CM

## 2011-04-12 ENCOUNTER — Ambulatory Visit (HOSPITAL_COMMUNITY)
Admission: RE | Admit: 2011-04-12 | Discharge: 2011-04-12 | Disposition: A | Discharge status: Home / Self Care | Payer: 59 | Source: Ambulatory Visit | Attending: Oncology | Admitting: Oncology

## 2011-04-12 DIAGNOSIS — I824Y9 Acute embolism and thrombosis of unspecified deep veins of unspecified proximal lower extremity: Secondary | ICD-10-CM | POA: Insufficient documentation

## 2011-04-12 DIAGNOSIS — M7989 Other specified soft tissue disorders: Secondary | ICD-10-CM

## 2011-04-12 DIAGNOSIS — M79609 Pain in unspecified limb: Secondary | ICD-10-CM | POA: Insufficient documentation

## 2011-04-20 ENCOUNTER — Encounter (HOSPITAL_COMMUNITY): Payer: Self-pay | Admitting: Obstetrics and Gynecology

## 2011-04-21 DEATH — deceased

## 2011-05-21 ENCOUNTER — Inpatient Hospital Stay (INDEPENDENT_AMBULATORY_CARE_PROVIDER_SITE_OTHER)
Admission: RE | Admit: 2011-05-21 | Discharge: 2011-05-21 | Disposition: A | Discharge status: Home / Self Care | Payer: 59 | Source: Ambulatory Visit | Attending: Family Medicine | Admitting: Family Medicine

## 2011-05-21 DIAGNOSIS — M779 Enthesopathy, unspecified: Secondary | ICD-10-CM

## 2011-06-07 ENCOUNTER — Ambulatory Visit (HOSPITAL_COMMUNITY)
Admission: RE | Admit: 2011-06-07 | Discharge: 2011-06-07 | Disposition: A | Discharge status: Home / Self Care | Payer: 59 | Source: Ambulatory Visit | Attending: Orthopaedic Surgery | Admitting: Orthopaedic Surgery

## 2011-06-07 DIAGNOSIS — M7989 Other specified soft tissue disorders: Secondary | ICD-10-CM

## 2011-06-07 DIAGNOSIS — M79609 Pain in unspecified limb: Secondary | ICD-10-CM | POA: Insufficient documentation

## 2011-06-07 DIAGNOSIS — E669 Obesity, unspecified: Secondary | ICD-10-CM | POA: Insufficient documentation

## 2011-06-22 LAB — COMPREHENSIVE METABOLIC PANEL
ALT: 17 U/L (ref 0–35)
AST: 19 U/L (ref 0–37)
Albumin: 3.5 g/dL (ref 3.5–5.2)
Alkaline Phosphatase: 103 U/L (ref 39–117)
BUN: 9 mg/dL (ref 6–23)
CO2: 29 mEq/L (ref 19–32)
Calcium: 9.2 mg/dL (ref 8.4–10.5)
Chloride: 104 mEq/L (ref 96–112)
Creatinine, Ser: 0.84 mg/dL (ref 0.4–1.2)
GFR calc Af Amer: >60 mL/min (ref >60)
GFR calc non Af Amer: >60 mL/min (ref >60)
Glucose, Bld: 82 mg/dL (ref 70–99)
Potassium: 3.9 mEq/L (ref 3.5–5.1)
Sodium: 139 mEq/L (ref 135–145)
Total Bilirubin: 0.6 mg/dL (ref 0.3–1.2)
Total Protein: 7.2 g/dL (ref 6.0–8.3)

## 2011-06-22 LAB — URINALYSIS, ROUTINE W REFLEX MICROSCOPIC
Bilirubin Urine: NEGATIVE
Glucose, UA: NEGATIVE mg/dL
Hgb urine dipstick: NEGATIVE
Ketones, ur: NEGATIVE mg/dL
Nitrite: NEGATIVE
Protein, ur: NEGATIVE mg/dL
Specific Gravity, Urine: 1.021 (ref 1.005–1.030)
Urobilinogen, UA: 1 mg/dL (ref 0.0–1.0)
pH: 6.5 (ref 5.0–8.0)

## 2011-06-22 LAB — CBC
HCT: 40.4 % (ref 36.0–46.0)
Hemoglobin: 13.3 g/dL (ref 12.0–15.0)
MCHC: 32.8 g/dL (ref 30.0–36.0)
MCV: 82.5 fL (ref 78.0–100.0)
Platelets: 205 10*3/uL (ref 150–400)
RBC: 4.9 MIL/uL (ref 3.87–5.11)
RDW: 15 % (ref 11.5–15.5)
WBC: 9.4 10*3/uL (ref 4.0–10.5)

## 2011-06-22 LAB — PROTIME-INR
INR: 0.9 (ref 0.00–1.49)
Prothrombin Time: 12.7 seconds (ref 11.6–15.2)

## 2011-06-22 LAB — HCG, SERUM, QUALITATIVE: Preg, Serum: NEGATIVE

## 2011-06-22 LAB — APTT: aPTT: 25 seconds (ref 24–37)

## 2011-06-24 LAB — PROTIME-INR
INR: 1.1 (ref 0.00–1.49)
INR: 1.7 — ABNORMAL HIGH (ref 0.00–1.49)
INR: 1.7 — ABNORMAL HIGH (ref 0.00–1.49)
INR: 1.7 — ABNORMAL HIGH (ref 0.00–1.49)
INR: 1.8 — ABNORMAL HIGH (ref 0.00–1.49)
Prothrombin Time: 14.6 seconds (ref 11.6–15.2)
Prothrombin Time: 20.8 seconds — ABNORMAL HIGH (ref 11.6–15.2)
Prothrombin Time: 20.9 seconds — ABNORMAL HIGH (ref 11.6–15.2)
Prothrombin Time: 21.1 seconds — ABNORMAL HIGH (ref 11.6–15.2)
Prothrombin Time: 21.8 seconds — ABNORMAL HIGH (ref 11.6–15.2)

## 2011-06-24 LAB — CBC
HCT: 28.6 % — ABNORMAL LOW (ref 36.0–46.0)
HCT: 30.2 % — ABNORMAL LOW (ref 36.0–46.0)
HCT: 31.8 % — ABNORMAL LOW (ref 36.0–46.0)
HCT: 32.1 % — ABNORMAL LOW (ref 36.0–46.0)
HCT: 35.1 % — ABNORMAL LOW (ref 36.0–46.0)
Hemoglobin: 10 g/dL — ABNORMAL LOW (ref 12.0–15.0)
Hemoglobin: 10.4 g/dL — ABNORMAL LOW (ref 12.0–15.0)
Hemoglobin: 10.6 g/dL — ABNORMAL LOW (ref 12.0–15.0)
Hemoglobin: 11.7 g/dL — ABNORMAL LOW (ref 12.0–15.0)
Hemoglobin: 9.4 g/dL — ABNORMAL LOW (ref 12.0–15.0)
MCHC: 32.6 g/dL (ref 30.0–36.0)
MCHC: 32.9 g/dL (ref 30.0–36.0)
MCHC: 32.9 g/dL (ref 30.0–36.0)
MCHC: 33 g/dL (ref 30.0–36.0)
MCHC: 33.2 g/dL (ref 30.0–36.0)
MCV: 81.7 fL (ref 78.0–100.0)
MCV: 81.8 fL (ref 78.0–100.0)
MCV: 82 fL (ref 78.0–100.0)
MCV: 83.2 fL (ref 78.0–100.0)
MCV: 83.4 fL (ref 78.0–100.0)
Platelets: 150 10*3/uL (ref 150–400)
Platelets: 154 10*3/uL (ref 150–400)
Platelets: 176 10*3/uL (ref 150–400)
Platelets: 179 10*3/uL (ref 150–400)
Platelets: 192 10*3/uL (ref 150–400)
RBC: 3.49 MIL/uL — ABNORMAL LOW (ref 3.87–5.11)
RBC: 3.64 MIL/uL — ABNORMAL LOW (ref 3.87–5.11)
RBC: 3.82 MIL/uL — ABNORMAL LOW (ref 3.87–5.11)
RBC: 3.93 MIL/uL (ref 3.87–5.11)
RBC: 4.3 MIL/uL (ref 3.87–5.11)
RDW: 15.1 % (ref 11.5–15.5)
RDW: 15.4 % (ref 11.5–15.5)
RDW: 15.8 % — ABNORMAL HIGH (ref 11.5–15.5)
RDW: 15.8 % — ABNORMAL HIGH (ref 11.5–15.5)
RDW: 15.9 % — ABNORMAL HIGH (ref 11.5–15.5)
WBC: 10.1 10*3/uL (ref 4.0–10.5)
WBC: 10.8 10*3/uL — ABNORMAL HIGH (ref 4.0–10.5)
WBC: 7.9 10*3/uL (ref 4.0–10.5)
WBC: 8.1 10*3/uL (ref 4.0–10.5)
WBC: 8.5 10*3/uL (ref 4.0–10.5)

## 2011-06-24 LAB — BASIC METABOLIC PANEL
BUN: 5 mg/dL — ABNORMAL LOW (ref 6–23)
BUN: 5 mg/dL — ABNORMAL LOW (ref 6–23)
BUN: 5 mg/dL — ABNORMAL LOW (ref 6–23)
BUN: 6 mg/dL (ref 6–23)
BUN: 8 mg/dL (ref 6–23)
CO2: 27 mEq/L (ref 19–32)
CO2: 29 mEq/L (ref 19–32)
CO2: 30 mEq/L (ref 19–32)
CO2: 30 mEq/L (ref 19–32)
CO2: 30 mEq/L (ref 19–32)
Calcium: 8.5 mg/dL (ref 8.4–10.5)
Calcium: 8.5 mg/dL (ref 8.4–10.5)
Calcium: 8.5 mg/dL (ref 8.4–10.5)
Calcium: 8.5 mg/dL (ref 8.4–10.5)
Calcium: 8.6 mg/dL (ref 8.4–10.5)
Chloride: 100 mEq/L (ref 96–112)
Chloride: 100 mEq/L (ref 96–112)
Chloride: 102 mEq/L (ref 96–112)
Chloride: 104 mEq/L (ref 96–112)
Chloride: 99 mEq/L (ref 96–112)
Creatinine, Ser: 0.73 mg/dL (ref 0.4–1.2)
Creatinine, Ser: 0.77 mg/dL (ref 0.4–1.2)
Creatinine, Ser: 0.82 mg/dL (ref 0.4–1.2)
Creatinine, Ser: 0.86 mg/dL (ref 0.4–1.2)
Creatinine, Ser: 0.96 mg/dL (ref 0.4–1.2)
GFR calc Af Amer: >60 mL/min (ref >60)
GFR calc Af Amer: >60 mL/min (ref >60)
GFR calc Af Amer: >60 mL/min (ref >60)
GFR calc Af Amer: >60 mL/min (ref >60)
GFR calc Af Amer: >60 mL/min (ref >60)
GFR calc non Af Amer: >60 mL/min (ref >60)
GFR calc non Af Amer: >60 mL/min (ref >60)
GFR calc non Af Amer: >60 mL/min (ref >60)
GFR calc non Af Amer: >60 mL/min (ref >60)
GFR calc non Af Amer: >60 mL/min (ref >60)
Glucose, Bld: 105 mg/dL — ABNORMAL HIGH (ref 70–99)
Glucose, Bld: 106 mg/dL — ABNORMAL HIGH (ref 70–99)
Glucose, Bld: 108 mg/dL — ABNORMAL HIGH (ref 70–99)
Glucose, Bld: 127 mg/dL — ABNORMAL HIGH (ref 70–99)
Glucose, Bld: 132 mg/dL — ABNORMAL HIGH (ref 70–99)
Potassium: 3.8 mEq/L (ref 3.5–5.1)
Potassium: 3.8 mEq/L (ref 3.5–5.1)
Potassium: 3.8 mEq/L (ref 3.5–5.1)
Potassium: 3.9 mEq/L (ref 3.5–5.1)
Potassium: 4 mEq/L (ref 3.5–5.1)
Sodium: 134 mEq/L — ABNORMAL LOW (ref 135–145)
Sodium: 135 mEq/L (ref 135–145)
Sodium: 137 mEq/L (ref 135–145)
Sodium: 138 mEq/L (ref 135–145)
Sodium: 138 mEq/L (ref 135–145)

## 2011-06-24 LAB — TYPE AND SCREEN
ABO/RH(D): B POS
Antibody Screen: NEGATIVE

## 2011-06-24 LAB — ABO/RH: ABO/RH(D): B POS

## 2011-07-02 LAB — D-DIMER, QUANTITATIVE: D-Dimer, Quant: 0.63 — ABNORMAL HIGH

## 2011-07-06 ENCOUNTER — Encounter (HOSPITAL_BASED_OUTPATIENT_CLINIC_OR_DEPARTMENT_OTHER): Payer: 59 | Admitting: Oncology

## 2011-07-06 DIAGNOSIS — K589 Irritable bowel syndrome without diarrhea: Secondary | ICD-10-CM

## 2011-07-06 DIAGNOSIS — I824Z9 Acute embolism and thrombosis of unspecified deep veins of unspecified distal lower extremity: Secondary | ICD-10-CM

## 2011-07-06 DIAGNOSIS — O039 Complete or unspecified spontaneous abortion without complication: Secondary | ICD-10-CM

## 2011-07-06 DIAGNOSIS — I10 Essential (primary) hypertension: Secondary | ICD-10-CM

## 2011-08-02 ENCOUNTER — Other Ambulatory Visit: Payer: Self-pay | Admitting: Family Medicine

## 2011-08-02 ENCOUNTER — Ambulatory Visit
Admission: RE | Admit: 2011-08-02 | Discharge: 2011-08-02 | Disposition: A | Discharge status: Home / Self Care | Payer: 59 | Source: Ambulatory Visit | Attending: Family Medicine | Admitting: Family Medicine

## 2011-08-02 ENCOUNTER — Other Ambulatory Visit: Payer: 59

## 2011-08-02 DIAGNOSIS — R51 Headache: Secondary | ICD-10-CM

## 2011-08-13 ENCOUNTER — Emergency Department (INDEPENDENT_AMBULATORY_CARE_PROVIDER_SITE_OTHER): Payer: 59

## 2011-08-13 ENCOUNTER — Encounter (HOSPITAL_BASED_OUTPATIENT_CLINIC_OR_DEPARTMENT_OTHER): Payer: Self-pay

## 2011-08-13 ENCOUNTER — Emergency Department (HOSPITAL_BASED_OUTPATIENT_CLINIC_OR_DEPARTMENT_OTHER)
Admission: EM | Admit: 2011-08-13 | Discharge: 2011-08-14 | Disposition: A | Discharge status: Home / Self Care | Payer: 59 | Attending: Emergency Medicine | Admitting: Emergency Medicine

## 2011-08-13 DIAGNOSIS — M542 Cervicalgia: Secondary | ICD-10-CM | POA: Insufficient documentation

## 2011-08-13 DIAGNOSIS — Z86718 Personal history of other venous thrombosis and embolism: Secondary | ICD-10-CM | POA: Insufficient documentation

## 2011-08-13 DIAGNOSIS — Y92410 Unspecified street and highway as the place of occurrence of the external cause: Secondary | ICD-10-CM | POA: Insufficient documentation

## 2011-08-13 DIAGNOSIS — M549 Dorsalgia, unspecified: Secondary | ICD-10-CM | POA: Insufficient documentation

## 2011-08-13 MED ORDER — KETOROLAC TROMETHAMINE 30 MG/ML IJ SOLN
30.0000 mg | Freq: Once | INTRAMUSCULAR | Status: AC
  Administered 2011-08-13: 30 mg via INTRAMUSCULAR
  Filled 2011-08-13: qty 1
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
## 2011-08-14 ENCOUNTER — Other Ambulatory Visit (HOSPITAL_BASED_OUTPATIENT_CLINIC_OR_DEPARTMENT_OTHER): Payer: 59

## 2011-08-14 ENCOUNTER — Emergency Department (INDEPENDENT_AMBULATORY_CARE_PROVIDER_SITE_OTHER): Payer: 59

## 2011-08-14 DIAGNOSIS — M542 Cervicalgia: Secondary | ICD-10-CM

## 2011-08-14 DIAGNOSIS — M545 Low back pain, unspecified: Secondary | ICD-10-CM

## 2011-08-14 MED ORDER — IBUPROFEN 600 MG PO TABS: 600.0000 mg | ORAL_TABLET | Freq: Four times a day (QID) | ORAL | Status: AC | PRN

## 2011-08-14 MED ORDER — HYDROCODONE-ACETAMINOPHEN 5-500 MG PO TABS: 1.0000 | ORAL_TABLET | Freq: Four times a day (QID) | ORAL | Status: AC | PRN
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
## 2011-09-08 ENCOUNTER — Encounter: Payer: Self-pay | Admitting: Women's Health

## 2011-09-08 ENCOUNTER — Ambulatory Visit (INDEPENDENT_AMBULATORY_CARE_PROVIDER_SITE_OTHER): Payer: 59 | Admitting: Women's Health

## 2011-09-08 DIAGNOSIS — B9689 Other specified bacterial agents as the cause of diseases classified elsewhere: Secondary | ICD-10-CM

## 2011-09-08 DIAGNOSIS — A499 Bacterial infection, unspecified: Secondary | ICD-10-CM

## 2011-09-08 DIAGNOSIS — B3731 Acute candidiasis of vulva and vagina: Secondary | ICD-10-CM

## 2011-09-08 DIAGNOSIS — N898 Other specified noninflammatory disorders of vagina: Secondary | ICD-10-CM

## 2011-09-08 DIAGNOSIS — O039 Complete or unspecified spontaneous abortion without complication: Secondary | ICD-10-CM | POA: Insufficient documentation

## 2011-09-08 DIAGNOSIS — B373 Candidiasis of vulva and vagina: Secondary | ICD-10-CM

## 2011-09-08 DIAGNOSIS — N760 Acute vaginitis: Secondary | ICD-10-CM

## 2011-09-08 MED ORDER — METRONIDAZOLE 500 MG PO TABS: 500.0000 mg | ORAL_TABLET | Freq: Two times a day (BID) | ORAL | Status: AC

## 2011-09-08 NOTE — Progress Notes (Signed)
Patient ID: Ashley [name], female   DOB: April 27, 1976, 35 y.o.   MRN: [id number] Presents with a complaint of vaginal discharge with an odor. States has had for several days. Denies any urinary symptoms. History of infertility, has seen Dr. Chevis Pretty, scheduled for a hysteroscopic polypectomy tomorrow.   Exam: External genitalia is within normal limits, speculum exam scant amount of a white, adherent discharge with an odor was present. Wet prep positive for amines, clue cells and +4 bacteria. Bimanual no CMT or adnexal fullness or tenderness.  BV  Plan: Flagyl 500 by mouth twice a day for 7 days, prescription, proper use, avoiding alcohol discussed. Inform at  surgery tomorrow treatment for BV today.

## 2011-09-09 ENCOUNTER — Other Ambulatory Visit: Payer: Self-pay | Admitting: Obstetrics and Gynecology

## 2011-09-15 ENCOUNTER — Other Ambulatory Visit: Payer: Self-pay | Admitting: Orthopaedic Surgery

## 2011-09-15 DIAGNOSIS — M545 Low back pain, unspecified: Secondary | ICD-10-CM

## 2011-09-15 DIAGNOSIS — IMO0002 Reserved for concepts with insufficient information to code with codable children: Secondary | ICD-10-CM

## 2011-09-18 ENCOUNTER — Ambulatory Visit
Admission: RE | Admit: 2011-09-18 | Discharge: 2011-09-18 | Disposition: A | Discharge status: Home / Self Care | Payer: 59 | Source: Ambulatory Visit | Attending: Orthopaedic Surgery | Admitting: Orthopaedic Surgery

## 2011-09-18 DIAGNOSIS — M545 Low back pain, unspecified: Secondary | ICD-10-CM

## 2011-09-18 DIAGNOSIS — IMO0002 Reserved for concepts with insufficient information to code with codable children: Secondary | ICD-10-CM

## 2011-11-06 ENCOUNTER — Emergency Department (HOSPITAL_COMMUNITY)
Admission: EM | Admit: 2011-11-06 | Discharge: 2011-11-06 | Disposition: A | Discharge status: Home / Self Care | Payer: 59 | Attending: Emergency Medicine | Admitting: Emergency Medicine

## 2011-11-06 ENCOUNTER — Other Ambulatory Visit: Payer: Self-pay

## 2011-11-06 ENCOUNTER — Encounter (HOSPITAL_COMMUNITY): Payer: Self-pay | Admitting: Emergency Medicine

## 2011-11-06 ENCOUNTER — Emergency Department (HOSPITAL_COMMUNITY): Payer: 59

## 2011-11-06 DIAGNOSIS — R0602 Shortness of breath: Secondary | ICD-10-CM | POA: Insufficient documentation

## 2011-11-06 DIAGNOSIS — I10 Essential (primary) hypertension: Secondary | ICD-10-CM | POA: Insufficient documentation

## 2011-11-06 DIAGNOSIS — R609 Edema, unspecified: Secondary | ICD-10-CM | POA: Insufficient documentation

## 2011-11-06 DIAGNOSIS — M7989 Other specified soft tissue disorders: Secondary | ICD-10-CM | POA: Insufficient documentation

## 2011-11-06 DIAGNOSIS — R0989 Other specified symptoms and signs involving the circulatory and respiratory systems: Secondary | ICD-10-CM | POA: Insufficient documentation

## 2011-11-06 DIAGNOSIS — F329 Major depressive disorder, single episode, unspecified: Secondary | ICD-10-CM | POA: Insufficient documentation

## 2011-11-06 DIAGNOSIS — Z86718 Personal history of other venous thrombosis and embolism: Secondary | ICD-10-CM | POA: Insufficient documentation

## 2011-11-06 DIAGNOSIS — F3289 Other specified depressive episodes: Secondary | ICD-10-CM | POA: Insufficient documentation

## 2011-11-06 DIAGNOSIS — R0609 Other forms of dyspnea: Secondary | ICD-10-CM | POA: Insufficient documentation

## 2011-11-06 DIAGNOSIS — R000 Tachycardia, unspecified: Secondary | ICD-10-CM | POA: Insufficient documentation

## 2011-11-06 DIAGNOSIS — L905 Scar conditions and fibrosis of skin: Secondary | ICD-10-CM | POA: Insufficient documentation

## 2011-11-06 DIAGNOSIS — Z79899 Other long term (current) drug therapy: Secondary | ICD-10-CM | POA: Insufficient documentation

## 2011-11-06 LAB — TROPONIN I: Troponin I: <0.3 ng/mL (ref <0.30)

## 2011-11-06 LAB — BASIC METABOLIC PANEL
BUN: 14 mg/dL (ref 6–23)
CO2: 26 mEq/L (ref 19–32)
Calcium: 9.3 mg/dL (ref 8.4–10.5)
Chloride: 100 mEq/L (ref 96–112)
Creatinine, Ser: 0.85 mg/dL (ref 0.50–1.10)
GFR calc Af Amer: >90 mL/min (ref >90)
GFR calc non Af Amer: 87 mL/min — ABNORMAL LOW (ref >90)
Glucose, Bld: 109 mg/dL — ABNORMAL HIGH (ref 70–99)
Potassium: 3.8 mEq/L (ref 3.5–5.1)
Sodium: 135 mEq/L (ref 135–145)

## 2011-11-06 LAB — CBC
HCT: 36.4 % (ref 36.0–46.0)
Hemoglobin: 12.1 g/dL (ref 12.0–15.0)
MCH: 27.1 pg (ref 26.0–34.0)
MCHC: 33.2 g/dL (ref 30.0–36.0)
MCV: 81.6 fL (ref 78.0–100.0)
Platelets: 210 10*3/uL (ref 150–400)
RBC: 4.46 MIL/uL (ref 3.87–5.11)
RDW: 15.5 % (ref 11.5–15.5)
WBC: 10.2 10*3/uL (ref 4.0–10.5)

## 2011-11-06 LAB — D-DIMER, QUANTITATIVE: D-Dimer, Quant: 0.35 ug/mL-FEU (ref 0.00–0.48)

## 2011-11-06 LAB — APTT: aPTT: 25 seconds (ref 24–37)

## 2011-11-06 LAB — PROTIME-INR
INR: 0.88 (ref 0.00–1.49)
Prothrombin Time: 12.1 seconds (ref 11.6–15.2)

## 2011-11-06 LAB — PRO B NATRIURETIC PEPTIDE: Pro B Natriuretic peptide (BNP): 17.2 pg/mL (ref 0–125)

## 2011-11-06 MED ORDER — POTASSIUM CHLORIDE ER 10 MEQ PO TBCR: 10.0000 meq | EXTENDED_RELEASE_TABLET | Freq: Two times a day (BID) | ORAL | Status: DC

## 2011-11-06 MED ORDER — FUROSEMIDE 20 MG PO TABS: 20.0000 mg | ORAL_TABLET | Freq: Every day | ORAL | Status: DC

## 2011-11-06 MED ORDER — IOHEXOL 350 MG/ML SOLN
100.0000 mL | Freq: Once | INTRAVENOUS | Status: AC | PRN
  Administered 2011-11-06: 100 mL via INTRAVENOUS

## 2011-11-06 NOTE — Discharge Instructions (Signed)
Try wearing compression stockings. Follow up with your doctor this week to be rechecked. Return as needed for worsening symptoms  Peripheral Edema You have swelling in your legs (peripheral edema). This swelling is due to excess accumulation of salt and water in your body. Edema may be a sign of heart, kidney or liver disease, or a side effect of a medication. It may also be due to problems in the leg veins. Elevating your legs and using special support stockings may be very helpful, if the cause of the swelling is due to poor venous circulation. Avoid long periods of standing, whatever the cause. Treatment of edema depends on identifying the cause. Chips, pretzels, pickles and other salty foods should be avoided. Restricting salt in your diet is almost always needed. Water pills (diuretics) are often used to remove the excess salt and water from your body via urine. These medicines prevent the kidney from reabsorbing sodium. This increases urine flow. Diuretic treatment may also result in lowering of potassium levels in your body. Potassium supplements may be needed if you have to use diuretics daily. Daily weights can help you keep track of your progress in clearing your edema. You should call your caregiver for follow up care as recommended. SEEK IMMEDIATE MEDICAL CARE IF:   You have increased swelling, pain, redness, or heat in your legs.   You develop shortness of breath, especially when lying down.   You develop chest or abdominal pain, weakness, or fainting.   You have a fever.  Document Released: 10/14/2004 Document Revised: 05/19/2011 Document Reviewed: 09/24/2009 [facility] Patient Information 2012 [place], [place].

## 2011-11-06 NOTE — ED Provider Notes (Addendum)
History     CSN: [id number]  Arrival date & time 11/06/11  [place]   First MD Initiated Contact with Patient 11/06/11 1950      Chief Complaint  Patient presents with  . Shortness of Breath  . Leg Swelling    (Consider location/radiation/quality/duration/timing/severity/associated sxs/prior treatment) Patient is a 36 y.o. female presenting with shortness of breath.  Shortness of Breath  The current episode started today. The onset was gradual. The problem has been unchanged. The problem is severe. The symptoms are relieved by nothing. The symptoms are aggravated by activity. Associated symptoms include shortness of breath. Pertinent negatives include no chest pain, no orthopnea, no fever, no sore throat, no cough and no wheezing.   patient has a history of deep venous thrombosis. She no longer is taking anticoagulant medications. Patient states she feels like she is having swelling in both of her legs but more so on the left side. She has not been on any recent trips or travel. She is very short of breath when she exerts herself.  Past Medical History  Diagnosis Date  . PCOS (polycystic ovarian syndrome)   . Endometrial polyp   . Infertility   . DVT (deep venous thrombosis) 08/26/2010    LEFT LEG  . Hypertension     BORDERLINE  . Miscarriage 03/2011  . Depression     Past Surgical History  Procedure Date  . Knee surgery     X4-5  . Ankle surgery     X2  . 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 Hysteroscopy 2011    HYSTEROSCOPY, D&C  . Dilation and evacuation 04/02/2011    Procedure: DILATATION AND EVACUATION (D&E);  Surgeon: Turner Daniels, MD;  Location: [facility];  Service: Gynecology;  Laterality: N/A;  dvt left mid thigh  . Dilation and curettage of uterus 2011/03-2011    HYSTEROSCOPY, D&C    Family History  Problem Relation Age of Onset  . Diabetes Father   . Cancer Father     COLON  . Heart disease Father   . Diabetes Sister   . Cancer Maternal Grandmother    UTERINE    History  Substance Use Topics  . Smoking status: Never Smoker   . Smokeless tobacco: Not on file  . Alcohol Use: No    OB History    Grav Para Term Preterm Abortions TAB SAB Ect Mult Living   10    1  1    0      Review of Systems  Constitutional: Negative for fever.  HENT: Negative for sore throat.   Respiratory: Positive for shortness of breath. Negative for cough and wheezing.   Cardiovascular: Negative for chest pain and orthopnea.  All other systems reviewed and are negative.    Allergies  Codeine and Cymbalta  Home Medications   Current Outpatient Rx  Name Route Sig Dispense Refill  . ARIPIPRAZOLE 2 MG PO TABS Oral Take 2 mg by mouth daily.      Marland Kitchen BACLOFEN 10 MG PO TABS Oral Take 10 mg by mouth 3 (three) times daily.      Marland Kitchen ESCITALOPRAM OXALATE 20 MG PO TABS Oral Take 30 mg by mouth daily. 1 1/2 TABS DAILY     . HYDROCHLOROTHIAZIDE 12.5 MG PO CAPS Oral Take 12.5 mg by mouth daily.      Marland Kitchen PRENATAL PLUS 27-1 MG PO TABS Oral Take 1 tablet by mouth daily.      . TOPIRAMATE 25  MG PO TABS Oral Take 75 mg by mouth 2 (two) times daily.     Marland Kitchen ENOXAPARIN SODIUM 120 MG/0.8ML [place] SOLN Subcutaneous Inject 0.8 mLs (120 mg total) into the skin every 12 (twelve) hours. 12.6 mL 60    BP 120/74  Pulse 117  Temp(Src) 98.1 F (36.7 C) (Oral)  Resp 23  SpO2 98%  Physical Exam  Nursing note and vitals reviewed. Constitutional: She appears well-developed and well-nourished. No distress.  HENT:  Head: Normocephalic and atraumatic.  Right Ear: External ear normal.  Left Ear: External ear normal.  Eyes: Conjunctivae are normal. Right eye exhibits no discharge. Left eye exhibits no discharge. No scleral icterus.  Neck: Neck supple. No tracheal deviation present.  Cardiovascular: Regular rhythm and intact distal pulses.        Tachycardic  Pulmonary/Chest: Breath sounds normal. No stridor. No respiratory distress. She has no wheezes. She has no rales. She exhibits no  tenderness.       Labored breathing  Abdominal: Soft. Bowel sounds are normal. She exhibits no distension. There is no tenderness. There is no rebound and no guarding.  Musculoskeletal: She exhibits edema. She exhibits no tenderness.       Bilateral leg edema, chronic scars bilateral knees no cords palpable  Neurological: She is alert. She has normal strength. No sensory deficit. Cranial nerve deficit:  no gross defecits noted. She exhibits normal muscle tone. She displays no seizure activity. Coordination normal.  Skin: Skin is warm and dry. No rash noted.  Psychiatric: She has a normal mood and affect.    ED Course  Procedures (including critical care time)  Date: 11/06/2011  Rate: 103  Rhythm: sinus tachycardia  QRS Axis: normal  Intervals: normal  ST/T Wave abnormalities: normal  Conduction Disutrbances:none  Narrative Interpretation:   Old EKG Reviewed: none available   Labs Reviewed  BASIC METABOLIC PANEL - Abnormal; Notable for the following:    Glucose, Bld 109 (*)    GFR calc non Af Amer 87 (*)    All other components within normal limits  PRO B NATRIURETIC PEPTIDE  CBC  D-DIMER, QUANTITATIVE  TROPONIN I  APTT  PROTIME-INR   Ct Angio Chest W/cm &/or Wo Cm  11/06/2011  *RADIOLOGY REPORT*  Clinical Data: Shortness of breath and left leg swelling.  History of DVT.  CT ANGIOGRAPHY CHEST  Technique:  Multidetector CT imaging of the chest using the standard protocol during bolus administration of intravenous contrast. Multiplanar reconstructed images including MIPs were obtained and reviewed to evaluate the vascular anatomy.  Contrast: OMNIPAQUE IOHEXOL 350 MG/ML IV SOLN  Comparison: Chest radiograph performed earlier today at 08:30 p.m., and CTA of the chest performed 08/27/2010  Findings: There is no evidence of central pulmonary embolus. Evaluation for pulmonary embolus is suboptimal due to artifact from the patient's habitus.  Minimal bilateral dependent subsegmental  atelectasis is noted.  The lungs are otherwise clear.  There is no evidence of significant focal consolidation, pleural effusion or pneumothorax.  No masses are identified; no abnormal focal contrast enhancement is seen.  The mediastinum is unremarkable in appearance.  There is no evidence of mediastinal lymphadenopathy.  No pericardial effusion is seen.  The great vessels are within normal limits.  No axillary lymphadenopathy is seen.  The thyroid gland is unremarkable in appearance.  The visualized portions of the liver and spleen are unremarkable.  No acute osseous abnormalities are seen.  IMPRESSION:  1.  No evidence of central pulmonary embolus. 2.  Minimal bilateral dependent subsegmental atelectasis noted; lungs otherwise clear.  Original Report Authenticated By: Tonia Ghent, M.D.   Dg Chest Portable 1 View  11/06/2011  *RADIOLOGY REPORT*  Clinical Data: Shortness of breath; bilateral lower extremity edema.  PORTABLE CHEST - 1 VIEW  Comparison: Chest radiograph and CTA of the chest performed 08/27/2010  Findings: The lungs are well-aerated.  Mild bibasilar opacities are noted; given underlying vascular congestion, this could reflect minimal interstitial edema.  There is no evidence of pleural effusion or pneumothorax.  The cardiomediastinal silhouette is borderline normal in size.  No acute osseous abnormalities are seen.  IMPRESSION: Mild bibasilar airspace opacities; given underlying vascular congestion, this could reflect minimal interstitial edema.  Original Report Authenticated By: Tonia Ghent, M.D.      MDM  Patient does not have any evidence of pulmonary embolism. There is no pneumonia or pulmonary edema. Patient does have edema of her bilateral legs. She has had a history of this before and has been taking hydrochlorothiazide. At this point I feel it is reasonable for the patient is discharged home. I do not feel there is evidence of recurrent DVT at this time or pulmonary embolism. At  this time the patient's heart rate is in the 90s. She has been able to a newly to the bathroom off of oxygen without any difficulty. She has mentioned some nasal congestion and upper respiratory symptoms Be contributing to her complaints.       Celene Kras, MD 11/06/11 [phone]

## 2011-11-06 NOTE — ED Notes (Signed)
Patient complaining of shortness of breath and bilateral leg edema (most significant on left leg) since this morning.  Patient states that she has history of DVT in the left leg (December 2011).  Left leg is slightly more warm than right leg upon assessment.

## 2011-11-06 NOTE — ED Notes (Signed)
Pt return to room from ct angio

## 2011-11-06 NOTE — ED Notes (Addendum)
Pt stated SOB started today, right leg swollen and tender to touch for the last 2days and is getting worse today. Stated hx of DVT in 2011 to the left leg.

## 2011-11-06 NOTE — ED Notes (Signed)
Pt given food to eat.

## 2011-11-06 NOTE — ED Notes (Signed)
Pt dc home, dc instruction given and explained to pt. Pt stated understanding. Zero needs voiced.

## 2011-11-06 NOTE — ED Notes (Signed)
Pt off to ct angio

## 2012-03-29 ENCOUNTER — Emergency Department (INDEPENDENT_AMBULATORY_CARE_PROVIDER_SITE_OTHER): Payer: 59

## 2012-03-29 ENCOUNTER — Emergency Department (INDEPENDENT_AMBULATORY_CARE_PROVIDER_SITE_OTHER)
Admission: EM | Admit: 2012-03-29 | Discharge: 2012-03-29 | Disposition: A | Discharge status: Home / Self Care | Payer: 59 | Source: Home / Self Care | Attending: Family Medicine | Admitting: Family Medicine

## 2012-03-29 ENCOUNTER — Encounter (HOSPITAL_COMMUNITY): Payer: Self-pay

## 2012-03-29 DIAGNOSIS — S93505A Unspecified sprain of left lesser toe(s), initial encounter: Secondary | ICD-10-CM

## 2012-03-29 DIAGNOSIS — S93609A Unspecified sprain of unspecified foot, initial encounter: Secondary | ICD-10-CM

## 2012-03-29 HISTORY — DX: Dorsalgia, unspecified: M54.9

## 2012-03-29 LAB — POCT PREGNANCY, URINE: Preg Test, Ur: NEGATIVE

## 2012-03-29 NOTE — ED Notes (Signed)
Pt states she hit her lt little toe on the wall or furniture early am yesterday.  C/o pain in lt little toe and back into lateral aspect of lt foot.

## 2012-03-29 NOTE — ED Provider Notes (Signed)
History     CSN: [id number]  Arrival date & time 03/29/12  1101   First MD Initiated Contact with Patient 03/29/12 1117      Chief Complaint  Patient presents with  . Toe Injury    (Consider location/radiation/quality/duration/timing/severity/associated sxs/prior treatment) HPI Comments: 36 year old nondiabetic female with history of DVT of mood disorder, hypertension, PCOS and chronic pain issues. Here complaining of injuring her left fifth toe yesterday morning when getting out of bed, described as she hit her toe against the bed pole or the wall (patient is not sure). A small piece of her nail split and patient self removed yesterday. No bleeding. Reports pain lateral to fifth toe worse with putting weight while walking or when touching the area.   Past Medical History  Diagnosis Date  . PCOS (polycystic ovarian syndrome)   . Endometrial polyp   . Infertility   . DVT (deep venous thrombosis) 08/26/2010    LEFT LEG  . Hypertension     BORDERLINE  . Miscarriage 03/2011  . Depression   . Back pain     Past Surgical History  Procedure Date  . Knee surgery     X4-5  . Ankle surgery     X2  . 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 Hysteroscopy 2011    HYSTEROSCOPY, D&C  . Dilation and evacuation 04/02/2011    Procedure: DILATATION AND EVACUATION (D&E);  Surgeon: Turner Daniels, MD;  Location: [facility];  Service: Gynecology;  Laterality: N/A;  dvt left mid thigh  . Dilation and curettage of uterus 2011/03-2011    HYSTEROSCOPY, D&C    Family History  Problem Relation Age of Onset  . Diabetes Father   . Cancer Father     COLON  . Heart disease Father   . Diabetes Sister   . Cancer Maternal Grandmother     UTERINE    History  Substance Use Topics  . Smoking status: Never Smoker   . Smokeless tobacco: Not on file  . Alcohol Use: Yes    OB History    Grav Para Term Preterm Abortions TAB SAB Ect Mult Living   10    1  1    0      Review of Systems    Constitutional:       10 systems reviewed and  pertinent negative and positive symptoms are as per HPI.     Musculoskeletal:       As per HPI  All other systems reviewed and are negative.    Allergies  Codeine and Cymbalta  Home Medications   Current Outpatient Rx  Name Route Sig Dispense Refill  . ESCITALOPRAM OXALATE 20 MG PO TABS Oral Take 30 mg by mouth daily. 1 1/2 TABS DAILY     . FUROSEMIDE 20 MG PO TABS Oral Take 1 tablet (20 mg total) by mouth daily. 14 tablet 0    Dispense as written.  Marland Kitchen HYDROCODONE-ACETAMINOPHEN 10-500 MG PO TABS Oral Take 1 tablet by mouth every 6 (six) hours as needed.    Marland Kitchen ZOLPIDEM TARTRATE 10 MG PO TABS Oral Take 10 mg by mouth at bedtime as needed.    . ARIPIPRAZOLE 2 MG PO TABS Oral Take 2 mg by mouth daily.      Marland Kitchen BACLOFEN 10 MG PO TABS Oral Take 10 mg by mouth 3 (three) times daily.      Marland Kitchen ENOXAPARIN SODIUM 120 MG/0.8ML [place] SOLN Subcutaneous Inject 0.8 mLs (120 mg  total) into the skin every 12 (twelve) hours. 12.6 mL 60  . HYDROCHLOROTHIAZIDE 12.5 MG PO CAPS Oral Take 12.5 mg by mouth daily.      Marland Kitchen POTASSIUM CHLORIDE ER 10 MEQ PO TBCR Oral Take 1 tablet (10 mEq total) by mouth 2 (two) times daily. 20 tablet 0  . PRENATAL PLUS 27-1 MG PO TABS Oral Take 1 tablet by mouth daily.      . TOPIRAMATE 25 MG PO TABS Oral Take 75 mg by mouth 2 (two) times daily.       BP 155/91  Pulse 96  Temp 98.1 F (36.7 C) (Oral)  Resp 18  SpO2 98%  LMP 01/31/2012  Physical Exam  Nursing note and vitals reviewed. Constitutional: She is oriented to person, place, and time. She appears well-developed and well-nourished. No distress.  HENT:  Head: Normocephalic and atraumatic.  Eyes: Pupils are equal, round, and reactive to light.  Cardiovascular: Normal heart sounds.   Pulmonary/Chest: Breath sounds normal.  Musculoskeletal:       Left foot: 5th toe. No obvious deformity. Reported difussed tenderness over 5th metatarsal. Also tenderness to palpation lateral  to MPJ, medial and distal phalange of left 5th toe. There is nail thickening with a minimal piece removed from lateral corner living a non bleeding 1-2 mm red base. No associated swelling or hematoma.  Left foot neurovascularly intact.   Neurological: She is alert and oriented to person, place, and time.  Skin: No rash noted.       No abrasions, swelling, erythema, bruising or lacerations.     ED Course  Procedures (including critical care time)   Labs Reviewed  POCT PREGNANCY, URINE  LAB REPORT - SCANNED   Dg Foot Complete Left  03/29/2012  *RADIOLOGY REPORT*  Clinical Data: Injury, pain.  LEFT FOOT - COMPLETE 3+ VIEW  Comparison: Plain films 02/21/2008.  Findings: No acute bony or joint abnormality is identified.  The patient is status post subtalar fusion and fusion of the PIP joint of the second toe, unchanged. Old fracture of the proximal phalanx of the fourth toe is noted.  Soft tissues are unremarkable.  IMPRESSION: No acute finding.  Original Report Authenticated By: Bernadene Bell. D'ALESSIO, M.D.     1. Sprain of toe, fifth, left       MDM  Treated with rigid sole pot op shoe. Continue to take chronic pain medications as needed. Return as needed.        Sharin Grave, MD 03/30/12 1152

## 2012-03-29 NOTE — Discharge Instructions (Signed)
There are no signs of bone injury or fractures in your x-rays. Keep using rigid soled shoe until pain completely resolves. Continue to take chronic pain medications as previously prescribed. Return as needed.

## 2012-12-23 ENCOUNTER — Ambulatory Visit: Payer: Self-pay

## 2012-12-23 ENCOUNTER — Encounter (HOSPITAL_COMMUNITY): Payer: Self-pay | Admitting: *Deleted

## 2012-12-23 ENCOUNTER — Emergency Department (HOSPITAL_COMMUNITY)
Admission: EM | Admit: 2012-12-23 | Discharge: 2012-12-23 | Disposition: A | Discharge status: Home / Self Care | Payer: Self-pay | Attending: Emergency Medicine | Admitting: Emergency Medicine

## 2012-12-23 ENCOUNTER — Emergency Department (HOSPITAL_COMMUNITY): Payer: Self-pay

## 2012-12-23 DIAGNOSIS — I10 Essential (primary) hypertension: Secondary | ICD-10-CM | POA: Insufficient documentation

## 2012-12-23 DIAGNOSIS — S92919A Unspecified fracture of unspecified toe(s), initial encounter for closed fracture: Secondary | ICD-10-CM | POA: Insufficient documentation

## 2012-12-23 DIAGNOSIS — W1809XA Striking against other object with subsequent fall, initial encounter: Secondary | ICD-10-CM | POA: Insufficient documentation

## 2012-12-23 DIAGNOSIS — Y929 Unspecified place or not applicable: Secondary | ICD-10-CM | POA: Insufficient documentation

## 2012-12-23 DIAGNOSIS — Z8659 Personal history of other mental and behavioral disorders: Secondary | ICD-10-CM | POA: Insufficient documentation

## 2012-12-23 DIAGNOSIS — Z8739 Personal history of other diseases of the musculoskeletal system and connective tissue: Secondary | ICD-10-CM | POA: Insufficient documentation

## 2012-12-23 DIAGNOSIS — Y9301 Activity, walking, marching and hiking: Secondary | ICD-10-CM | POA: Insufficient documentation

## 2012-12-23 DIAGNOSIS — Z86718 Personal history of other venous thrombosis and embolism: Secondary | ICD-10-CM | POA: Insufficient documentation

## 2012-12-23 DIAGNOSIS — S92912A Unspecified fracture of left toe(s), initial encounter for closed fracture: Secondary | ICD-10-CM

## 2012-12-23 DIAGNOSIS — Z8742 Personal history of other diseases of the female genital tract: Secondary | ICD-10-CM | POA: Insufficient documentation

## 2012-12-23 MED ORDER — HYDROCODONE-ACETAMINOPHEN 5-325 MG PO TABS: ORAL_TABLET | ORAL | Status: DC

## 2012-12-23 MED ORDER — IBUPROFEN 600 MG PO TABS: 600.0000 mg | ORAL_TABLET | Freq: Four times a day (QID) | ORAL | Status: DC | PRN

## 2012-12-23 NOTE — ED Notes (Signed)
Pt ambulatory to exam room with steady gait. Pt states she drove herself here.

## 2012-12-23 NOTE — ED Provider Notes (Signed)
History    This chart was scribed for non-physician practitioner working with Nelia Shi, MD by Frederik Pear, ED Scribe. This patient was seen in room WTR6/WTR6 and the patient's care was started at 1534.   CSN: [id number]  Arrival date & time 12/23/12  1439   First MD Initiated Contact with Patient 12/23/12 1534      Chief Complaint  Patient presents with  . Toe Injury    (Consider location/radiation/quality/duration/timing/severity/associated sxs/prior treatment) The history is provided by the patient and medical records. No language interpreter was used.    Ashley [name] is a 37 y.o. female with a h/o of bilateral ankle surgery performed by Dr. Eulah Pont, who presents to the Emergency Department complaining of sudden onset, constant, non-radiating fourth toe pain to the left foot that is aggravated by applying pressure and walking and alleviated by nothing that began around 1300 when she tripped while walking up stairs and scraped her toe against the concrete step. She denies any ankle or knee pain. She denies any treatments at home.   Past Medical History  Diagnosis Date  . PCOS (polycystic ovarian syndrome)   . Endometrial polyp   . Infertility   . DVT (deep venous thrombosis) 08/26/2010    LEFT LEG  . Hypertension     BORDERLINE  . Miscarriage 03/2011  . Depression   . Back pain     Past Surgical History  Procedure Laterality Date  . Knee surgery      X4-5  . Ankle surgery      X2  . 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 Hysteroscopy  2011    HYSTEROSCOPY, D&C  . Dilation and evacuation  04/02/2011    Procedure: DILATATION AND EVACUATION (D&E);  Surgeon: Turner Daniels, MD;  Location: [facility];  Service: Gynecology;  Laterality: N/A;  dvt left mid thigh  . Dilation and curettage of uterus  2011/03-2011    HYSTEROSCOPY, D&C    Family History  Problem Relation Age of Onset  . Diabetes Father   . Cancer Father     COLON  . Heart disease Father    . Diabetes Sister   . Cancer Maternal Grandmother     UTERINE    History  Substance Use Topics  . Smoking status: Never Smoker   . Smokeless tobacco: Not on file  . Alcohol Use: Yes    OB History   Grav Para Term Preterm Abortions TAB SAB Ect Mult Living   10    1  1    0      Review of Systems  Constitutional: Negative for activity change.  HENT: Negative for neck pain.   Gastrointestinal: Negative for nausea and vomiting.  Musculoskeletal: Positive for arthralgias. Negative for back pain and joint swelling.       Toe pain  Skin: Negative for wound.  Neurological: Negative for syncope, weakness and numbness.    Allergies  Codeine and Cymbalta  Home Medications   Current Outpatient Rx  Name  Route  Sig  Dispense  Refill  . HYDROcodone-acetaminophen (LORTAB) 10-500 MG per tablet   Oral   Take 1 tablet by mouth every 6 (six) hours as needed for pain.          Marland Kitchen zolpidem (AMBIEN) 10 MG tablet   Oral   Take 10 mg by mouth at bedtime as needed for sleep.            BP 138/91  Pulse 80  Temp(Src) 98.3 F (36.8 C) (Oral)  Resp 20  SpO2 98%  LMP 12/22/2012  Physical Exam  Nursing note and vitals reviewed. Constitutional: She appears well-developed and well-nourished.  HENT:  Head: Normocephalic and atraumatic.  Eyes: Conjunctivae are normal.  Neck: Normal range of motion. Neck supple.  Pulmonary/Chest: Effort normal. No respiratory distress. She has no wheezes. She has no rales. She exhibits no tenderness.  Abdominal: There is no tenderness.  Musculoskeletal: She exhibits tenderness.  Diffuse swelling and tenderness over the fourth digit of the left foot. No knee or ankle tenderness.  Neurological: She is alert.  Neurovascularly intact.   Psychiatric: She has a normal mood and affect. Her behavior is normal. Judgment and thought content normal.    ED Course  Procedures (including critical care time)  DIAGNOSTIC STUDIES: Oxygen Saturation is 98% on  room air, normal by my interpretation.    COORDINATION OF CARE:  16:03- Discussed planned course of treatment with the patient, including ibuprofen, Vicodin, and crutches, who is agreeable at this time.  Labs Reviewed - No data to display Dg Toe 4th Left  12/23/2012  *RADIOLOGY REPORT*  Clinical Data: Trauma to fourth toe.  LEFT FOURTH TOE  Comparison: 03/29/2012  Findings: There is a small nondisplaced fracture involving the dorsal plate of the distal phalanx.  No dislocation.  The bones appear osteopenic.  Soft tissue swelling is noted.  IMPRESSION:  Nondisplaced fracture involves the dorsal plate of the distal phalanx.   Original Report Authenticated By: Signa Kell, M.D.    1. Fracture of toe, left, closed, initial encounter    Patient seen and examined. X-ray reviewed by myself. Patient informed. Crutches by orthopedic technician.   Vital signs reviewed and are as follows: Filed Vitals:   12/23/12 1612  BP: 156/84  Pulse:   Temp:   Resp:   BP 156/84  Pulse 80  Temp(Src) 98.3 F (36.8 C) (Oral)  Resp 20  SpO2 98%  LMP 12/22/2012  Patient counseled on use of narcotic pain medications. Counseled not to combine these medications with others containing tylenol. Urged not to drink alcohol, drive, or perform any other activities that requires focus while taking these medications. The patient verbalizes understanding and agrees with the plan.     MDM  Patient with broken toe. Conservative management with orthopedic followup if not improved.  I personally performed the services described in this documentation, which was scribed in my presence. The recorded information has been reviewed and is accurate.        Renne Crigler, PA-C 12/23/12 [phone]

## 2012-12-23 NOTE — ED Notes (Signed)
Pt reports tripping over a step and injuring left fourth toe. Pt c/o pain with trying to move toe. No obvious deformity noted. Slight swelling noted.

## 2012-12-24 NOTE — ED Provider Notes (Signed)
Medical screening examination/treatment/procedure(s) were performed by non-physician practitioner and as supervising physician I was immediately available for consultation/collaboration.    Sidney Silberman L Charina Fons, MD 12/24/12 1551 

## 2013-07-25 ENCOUNTER — Emergency Department (INDEPENDENT_AMBULATORY_CARE_PROVIDER_SITE_OTHER): Payer: BC Managed Care – PPO

## 2013-07-25 ENCOUNTER — Emergency Department
Admission: EM | Admit: 2013-07-25 | Discharge: 2013-07-25 | Disposition: A | Discharge status: Home / Self Care | Payer: BC Managed Care – PPO | Source: Home / Self Care | Attending: Family Medicine | Admitting: Family Medicine

## 2013-07-25 ENCOUNTER — Encounter: Payer: Self-pay | Admitting: Emergency Medicine

## 2013-07-25 DIAGNOSIS — M778 Other enthesopathies, not elsewhere classified: Secondary | ICD-10-CM

## 2013-07-25 DIAGNOSIS — M25549 Pain in joints of unspecified hand: Secondary | ICD-10-CM

## 2013-07-25 DIAGNOSIS — M79609 Pain in unspecified limb: Secondary | ICD-10-CM

## 2013-07-25 NOTE — ED Provider Notes (Signed)
CSN: [id number]     Arrival date & time 07/25/13  2130 History   First MD Initiated Contact with Patient 07/25/13 [phone]     Chief Complaint  Patient presents with  . Finger Injury      HPI Comments: Patient was lifting boxes and bags into a new car two days ago.  Last night she noticed pain in her right hand, fifth finger with decreased range of motion.  She recalls no distinct injury. Her daily activities involving her right hand include typing on a keyboard and using a computer mouse.  Patient is a 37 y.o. female presenting with hand pain. The history is provided by the patient.  Hand Pain This is a new problem. The current episode started yesterday. The problem occurs constantly. The problem has not changed since onset.Associated symptoms comments: none. Exacerbated by: flexing right 5th finger. Nothing relieves the symptoms. She has tried nothing for the symptoms.    Past Medical History  Diagnosis Date  . PCOS (polycystic ovarian syndrome)   . Endometrial polyp   . Infertility   . DVT (deep venous thrombosis) 08/26/2010    LEFT LEG  . Hypertension     BORDERLINE  . Miscarriage 03/2011  . Depression   . Back pain    Past Surgical History  Procedure Laterality Date  . Knee surgery      X4-5  . Ankle surgery      X2  . 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 Hysteroscopy  2011    HYSTEROSCOPY, D&C  . Dilation and evacuation  04/02/2011    Procedure: DILATATION AND EVACUATION (D&E);  Surgeon: Turner Daniels, MD;  Location: [facility];  Service: Gynecology;  Laterality: N/A;  dvt left mid thigh  . Dilation and curettage of uterus  2011/03-2011    HYSTEROSCOPY, D&C   Family History  Problem Relation Age of Onset  . Diabetes Father   . Cancer Father     COLON  . Heart disease Father   . Diabetes Sister   . Cancer Maternal Grandmother     UTERINE   History  Substance Use Topics  . Smoking status: Never Smoker   . Smokeless tobacco: Not on file  . Alcohol Use: No   OB  History   Grav Para Term Preterm Abortions TAB SAB Ect Mult Living   10    1  1    0     Review of Systems  All other systems reviewed and are negative.    Allergies  Codeine and Cymbalta  Home Medications   Current Outpatient Rx  Name  Route  Sig  Dispense  Refill  . Prenatal Vit-Fe Fumarate-FA (PRENATAL MULTIVITAMIN) TABS tablet   Oral   Take 1 tablet by mouth daily at 12 noon.         Marland Kitchen HYDROcodone-acetaminophen (LORTAB) 10-500 MG per tablet   Oral   Take 1 tablet by mouth every 6 (six) hours as needed for pain.          Marland Kitchen HYDROcodone-acetaminophen (NORCO/VICODIN) 5-325 MG per tablet      Take 1-2 tablets every 6 hours as needed for severe pain   8 tablet   0   . ibuprofen (ADVIL,MOTRIN) 600 MG tablet   Oral   Take 1 tablet (600 mg total) by mouth every 6 (six) hours as needed for pain.   20 tablet   0   . zolpidem (AMBIEN) 10 MG tablet  Oral   Take 10 mg by mouth at bedtime as needed for sleep.           BP 124/81  Pulse 86  Temp(Src) 98.3 F (36.8 C) (Oral)  Resp 16  Wt 300 lb (136.079 kg)  SpO2 99%  LMP 07/03/2013 Physical Exam  Nursing note and vitals reviewed. Constitutional: She is oriented to person, place, and time. She appears well-developed and well-nourished. No distress.  Eyes: Conjunctivae are normal. Pupils are equal, round, and reactive to light.  Musculoskeletal:       Right hand: She exhibits tenderness and bony tenderness. She exhibits normal range of motion, normal two-point discrimination, normal capillary refill, no deformity, no laceration and no swelling. Normal sensation noted. Normal strength noted.       Hands: Right hand has distinct tenderness over 5th MCP joint and 5th metacarpal.  There is pain with resisted extension of the 5th MCP joint.  No swelling, erythema, or warmth.  Distal neurovascular function is intact.   Neurological: She is alert and oriented to person, place, and time.  Skin: Skin is warm and dry. No rash  noted.    ED Course  Procedures  none    Imaging Review Dg Hand Complete Right  07/25/2013   CLINICAL DATA:  Pain and tenderness of the 5th metacarpal phalangeal joint.  EXAM: RIGHT HAND - COMPLETE 3+ VIEW  COMPARISON:  None.  FINDINGS: There is no evidence of fracture or dislocation. There is no evidence of arthropathy or other focal bone abnormality. Soft tissues are unremarkable.  IMPRESSION: Normal exam.   Electronically Signed   By: Geanie Cooley M.D.   On: 07/25/2013 09:52      MDM   1. Hand (finger) tendonitis, vs. sprain     Begin anti-inflammatory medication such as Ibuprofen 200mg , 4 tabs every 8 hours with food.  Apply ice pack for about 3 to 4 times daily until improved.  Begin finger stretching exercises. Followup with [facility] if not improving about ten days.    Lattie Haw, MD 07/25/13 1100

## 2013-07-25 NOTE — ED Notes (Signed)
Pt c/o RT 5th finger pain x 2 days. Denies injury. She was recently carrying some groceries and moving some large items.

## 2013-07-26 ENCOUNTER — Other Ambulatory Visit: Payer: Self-pay

## 2013-08-10 ENCOUNTER — Encounter (HOSPITAL_COMMUNITY): Payer: Self-pay | Admitting: Emergency Medicine

## 2013-08-10 ENCOUNTER — Emergency Department (HOSPITAL_COMMUNITY)
Admission: EM | Admit: 2013-08-10 | Discharge: 2013-08-10 | Disposition: A | Discharge status: Home / Self Care | Payer: BC Managed Care – PPO | Attending: Emergency Medicine | Admitting: Emergency Medicine

## 2013-08-10 DIAGNOSIS — M5380 Other specified dorsopathies, site unspecified: Secondary | ICD-10-CM | POA: Insufficient documentation

## 2013-08-10 DIAGNOSIS — Z9889 Other specified postprocedural states: Secondary | ICD-10-CM | POA: Insufficient documentation

## 2013-08-10 DIAGNOSIS — M5431 Sciatica, right side: Secondary | ICD-10-CM

## 2013-08-10 DIAGNOSIS — M5430 Sciatica, unspecified side: Secondary | ICD-10-CM | POA: Insufficient documentation

## 2013-08-10 DIAGNOSIS — Z86718 Personal history of other venous thrombosis and embolism: Secondary | ICD-10-CM | POA: Insufficient documentation

## 2013-08-10 DIAGNOSIS — E669 Obesity, unspecified: Secondary | ICD-10-CM | POA: Insufficient documentation

## 2013-08-10 DIAGNOSIS — Z8659 Personal history of other mental and behavioral disorders: Secondary | ICD-10-CM | POA: Insufficient documentation

## 2013-08-10 DIAGNOSIS — Z8742 Personal history of other diseases of the female genital tract: Secondary | ICD-10-CM | POA: Insufficient documentation

## 2013-08-10 MED ORDER — METHOCARBAMOL 500 MG PO TABS
500.0000 mg | ORAL_TABLET | Freq: Once | ORAL | Status: AC
  Administered 2013-08-10: 500 mg via ORAL
  Filled 2013-08-10: qty 1

## 2013-08-10 MED ORDER — PREDNISONE 20 MG PO TABS
60.0000 mg | ORAL_TABLET | Freq: Once | ORAL | Status: AC
  Administered 2013-08-10: 60 mg via ORAL
  Filled 2013-08-10: qty 3

## 2013-08-10 MED ORDER — METHOCARBAMOL 500 MG PO TABS: 500.0000 mg | ORAL_TABLET | Freq: Two times a day (BID) | ORAL | Status: DC

## 2013-08-10 MED ORDER — HYDROMORPHONE HCL PF 1 MG/ML IJ SOLN
1.0000 mg | Freq: Once | INTRAMUSCULAR | Status: AC
  Administered 2013-08-10: 1 mg via INTRAMUSCULAR
  Filled 2013-08-10: qty 1

## 2013-08-10 MED ORDER — HYDROMORPHONE HCL PF 2 MG/ML IJ SOLN: 2.0000 mg | Freq: Once | INTRAMUSCULAR | Status: DC

## 2013-08-10 MED ORDER — PREDNISONE 10 MG PO TABS: 20.0000 mg | ORAL_TABLET | Freq: Every day | ORAL | Status: DC

## 2013-08-10 NOTE — ED Provider Notes (Signed)
CSN: [id number]     Arrival date & time 08/10/13  4098 History   First MD Initiated Contact with Patient 08/10/13 [phone]     Chief Complaint  Patient presents with  . Numbness  . Spasms   (Consider location/radiation/quality/duration/timing/severity/associated sxs/prior Treatment) HPI  37 year old female with history of polycystic ovarian syndrome, DVT, and back pain presents complaining of back pain. Patient onset started yesterday morning and has been ongoing throughout the whole entire day. Pain is persistent, sharp, starting in right low back and radiates down the right leg. Pain worsening with walking and with laying and improves when she leans over her bed. Report having tingling sensation down to her leg. Pain minimally improved with taking ibuprofen and Vicodin at home. No associated fever, chills, abdominal pain, dysuria, hematuria, urinary or bowel incontinence, or saddle paresthesia.  Chest pain or shortness of breath, no hemoptysis. Patient was able to work her shift yesterday but pain continue to persist. No hx of IVDU, no hx of cancer.  No prior hx of kidney stone.  Does attend pain management clinic for her chronic back pain.  Has hx of knee and ankle surgery.  Past Medical History  Diagnosis Date  . PCOS (polycystic ovarian syndrome)   . Endometrial polyp   . Infertility   . DVT (deep venous thrombosis) 08/26/2010    LEFT LEG  . Hypertension     BORDERLINE  . Miscarriage 03/2011  . Depression   . Back pain    Past Surgical History  Procedure Laterality Date  . Knee surgery      X4-5  . Ankle surgery      X2  . 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 Hysteroscopy  2011    HYSTEROSCOPY, D&C  . Dilation and evacuation  04/02/2011    Procedure: DILATATION AND EVACUATION (D&E);  Surgeon: Turner Daniels, MD;  Location: [facility];  Service: Gynecology;  Laterality: N/A;  dvt left mid thigh  . Dilation and curettage of uterus  2011/03-2011    HYSTEROSCOPY, D&C   Family  History  Problem Relation Age of Onset  . Diabetes Father   . Cancer Father     COLON  . Heart disease Father   . Diabetes Sister   . Cancer Maternal Grandmother     UTERINE   History  Substance Use Topics  . Smoking status: Never Smoker   . Smokeless tobacco: Not on file  . Alcohol Use: No   OB History   Grav Para Term Preterm Abortions TAB SAB Ect Mult Living   10    1  1    0     Review of Systems  Constitutional: Negative for fever.  Genitourinary: Negative for flank pain and pelvic pain.  Musculoskeletal: Positive for back pain.  Skin: Negative for rash and wound.  Neurological: Positive for numbness.    Allergies  Codeine and Cymbalta  Home Medications   Current Outpatient Rx  Name  Route  Sig  Dispense  Refill  . HYDROcodone-acetaminophen (LORTAB) 10-500 MG per tablet   Oral   Take 1 tablet by mouth every 6 (six) hours as needed for pain.          Marland Kitchen ibuprofen (ADVIL,MOTRIN) 600 MG tablet   Oral   Take 1 tablet (600 mg total) by mouth every 6 (six) hours as needed for pain.   20 tablet   0   . zolpidem (AMBIEN) 10 MG tablet   Oral  Take 10 mg by mouth at bedtime as needed for sleep.           LMP 07/03/2013 Physical Exam  Nursing note and vitals reviewed. Constitutional: She appears well-developed and well-nourished. No distress.  Moderately obese female appears uncomfortable, leaning over the bed.  HENT:  Head: Atraumatic.  Eyes: Conjunctivae are normal.  Neck: Neck supple.  Abdominal: There is no tenderness.  Genitourinary:  No CVA tenderness  Musculoskeletal: She exhibits tenderness (tenderness in lumbar and right paralumbar region with palpation and with range of motion. Positive straight leg raise.).  Neurological: She is alert.  Patellar deep tendon reflex intact, no foot drop, able to ambulate  Skin: No rash noted.  Psychiatric: She has a normal mood and affect.    ED Course  Procedures (including critical care time)  6:32  AM Pt with radicular low back pain.  No red flags.  Pain likely MSK.  Treatment given  7:31 AM Pain improves, pt able to ambulate. No hx of DM.  Plan to offer muscle relaxant and short course of steroid to augment pain medication that she has at home.  Return precaution given.     Labs Review Labs Reviewed - No data to display Imaging Review No results found.  EKG Interpretation   None       MDM   1. Sciatica, right    BP 171/111  Pulse 108  Temp(Src) 96.9 F (36.1 C) (Oral)  Resp 24  SpO2 98%  LMP 07/03/2013  BP 134/89  Pulse 78  Temp(Src) 98.1 F (36.7 C) (Oral)  Resp 20  SpO2 98%  LMP 07/03/2013  I have reviewed nursing notes and vital signs. I reviewed available ER/hospitalization records thought the EMR    Fayrene Helper, [place] 08/11/13 1438

## 2013-08-10 NOTE — ED Notes (Signed)
Patient is experiencing cramping and sharp shooting pain radiating down her right leg.

## 2013-08-10 NOTE — ED Notes (Signed)
Patient is experiencing pain starting in her lower back going down her right leg. Patient describes it as a sharp shooting pain. Patient has pain when weight bearing.

## 2013-08-12 ENCOUNTER — Emergency Department (HOSPITAL_COMMUNITY): Payer: BC Managed Care – PPO

## 2013-08-12 ENCOUNTER — Encounter (HOSPITAL_COMMUNITY): Payer: Self-pay | Admitting: Emergency Medicine

## 2013-08-12 ENCOUNTER — Emergency Department (HOSPITAL_COMMUNITY)
Admission: EM | Admit: 2013-08-12 | Discharge: 2013-08-12 | Disposition: A | Discharge status: Home / Self Care | Payer: BC Managed Care – PPO | Attending: Emergency Medicine | Admitting: Emergency Medicine

## 2013-08-12 DIAGNOSIS — Z8742 Personal history of other diseases of the female genital tract: Secondary | ICD-10-CM | POA: Insufficient documentation

## 2013-08-12 DIAGNOSIS — Z86718 Personal history of other venous thrombosis and embolism: Secondary | ICD-10-CM | POA: Insufficient documentation

## 2013-08-12 DIAGNOSIS — M5430 Sciatica, unspecified side: Secondary | ICD-10-CM | POA: Insufficient documentation

## 2013-08-12 DIAGNOSIS — R269 Unspecified abnormalities of gait and mobility: Secondary | ICD-10-CM | POA: Insufficient documentation

## 2013-08-12 DIAGNOSIS — Z3202 Encounter for pregnancy test, result negative: Secondary | ICD-10-CM | POA: Insufficient documentation

## 2013-08-12 DIAGNOSIS — Z8659 Personal history of other mental and behavioral disorders: Secondary | ICD-10-CM | POA: Insufficient documentation

## 2013-08-12 DIAGNOSIS — Z8639 Personal history of other endocrine, nutritional and metabolic disease: Secondary | ICD-10-CM | POA: Insufficient documentation

## 2013-08-12 DIAGNOSIS — M25569 Pain in unspecified knee: Secondary | ICD-10-CM | POA: Insufficient documentation

## 2013-08-12 DIAGNOSIS — Z79899 Other long term (current) drug therapy: Secondary | ICD-10-CM | POA: Insufficient documentation

## 2013-08-12 DIAGNOSIS — Z862 Personal history of diseases of the blood and blood-forming organs and certain disorders involving the immune mechanism: Secondary | ICD-10-CM | POA: Insufficient documentation

## 2013-08-12 DIAGNOSIS — IMO0002 Reserved for concepts with insufficient information to code with codable children: Secondary | ICD-10-CM | POA: Insufficient documentation

## 2013-08-12 DIAGNOSIS — M5431 Sciatica, right side: Secondary | ICD-10-CM

## 2013-08-12 DIAGNOSIS — IMO0001 Reserved for inherently not codable concepts without codable children: Secondary | ICD-10-CM | POA: Insufficient documentation

## 2013-08-12 LAB — URINALYSIS, ROUTINE W REFLEX MICROSCOPIC
Bilirubin Urine: NEGATIVE
Glucose, UA: NEGATIVE mg/dL
Hgb urine dipstick: NEGATIVE
Ketones, ur: NEGATIVE mg/dL
Leukocytes, UA: NEGATIVE
Nitrite: NEGATIVE
Protein, ur: NEGATIVE mg/dL
Specific Gravity, Urine: 1.028 (ref 1.005–1.030)
Urobilinogen, UA: 1 mg/dL (ref 0.0–1.0)
pH: 6 (ref 5.0–8.0)

## 2013-08-12 LAB — PREGNANCY, URINE: Preg Test, Ur: NEGATIVE

## 2013-08-12 MED ORDER — IBUPROFEN 800 MG PO TABS
800.0000 mg | ORAL_TABLET | Freq: Once | ORAL | Status: AC
  Administered 2013-08-12: 800 mg via ORAL
  Filled 2013-08-12: qty 1

## 2013-08-12 MED ORDER — DIAZEPAM 5 MG PO TABS: 5.0000 mg | ORAL_TABLET | Freq: Two times a day (BID) | ORAL | Status: DC | PRN

## 2013-08-12 MED ORDER — HYDROMORPHONE HCL PF 2 MG/ML IJ SOLN
2.0000 mg | Freq: Once | INTRAMUSCULAR | Status: AC
  Administered 2013-08-12: 2 mg via INTRAMUSCULAR
  Filled 2013-08-12: qty 1

## 2013-08-12 MED ORDER — DIAZEPAM 5 MG PO TABS
5.0000 mg | ORAL_TABLET | Freq: Once | ORAL | Status: AC
  Administered 2013-08-12: 5 mg via ORAL
  Filled 2013-08-12: qty 1

## 2013-08-12 NOTE — ED Notes (Signed)
States that she does not feel like this is a blood clot. She states that the pain originates in her right gluteal and radiates down her leg. Positive Homan's sign.

## 2013-08-12 NOTE — ED Notes (Signed)
Returned from [employer] via Doctor, general practice. No complaints at present

## 2013-08-12 NOTE — ED Provider Notes (Signed)
CSN: [id number]     Arrival date & time 08/12/13  1152 History   First MD Initiated Contact with Patient 08/12/13 1203     Chief Complaint  Patient presents with  . Back Pain    pain and spasm in r/buttock x 5 days  . Leg Pain    pain and numbness in r/leg   (Consider location/radiation/quality/duration/timing/severity/associated sxs/prior Treatment) HPI Comments: Patient complains of sharp stabbing pain in her right lower back that radiates down her right leg. This started 4 days ago she was walking down the stairs. She endorses a history of 3 bulging discs but no previous back surgery. She was seen in ED 2 days ago and treated for sciatica. Reports her pain is ongoing. Notably she sees a pain clinic and is on chronic hydrocodone. She denies any focal weakness in her leg but feels that she has some numbness in her foot when she stands on it. Denies any bowel or bladder incontinence first anesthesia. No fevers or vomiting. No history of cancer or illicit drug use. Denies abdominal pain.  The history is provided by the patient.    Past Medical History  Diagnosis Date  . PCOS (polycystic ovarian syndrome)   . Endometrial polyp   . Infertility   . DVT (deep venous thrombosis) 08/26/2010    LEFT LEG  . Hypertension     BORDERLINE  . Miscarriage 03/2011  . Depression   . Back pain    Past Surgical History  Procedure Laterality Date  . Knee surgery      X4-5  . Ankle surgery      X2  . 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 Hysteroscopy  2011    HYSTEROSCOPY, D&C  . Dilation and evacuation  04/02/2011    Procedure: DILATATION AND EVACUATION (D&E);  Surgeon: Turner Daniels, MD;  Location: [facility];  Service: Gynecology;  Laterality: N/A;  dvt left mid thigh  . Dilation and curettage of uterus  2011/03-2011    HYSTEROSCOPY, D&C   Family History  Problem Relation Age of Onset  . Diabetes Father   . Cancer Father     COLON  . Heart disease Father   . Diabetes Sister   . Cancer  Maternal Grandmother     UTERINE   History  Substance Use Topics  . Smoking status: Never Smoker   . Smokeless tobacco: Not on file  . Alcohol Use: No   OB History   Grav Para Term Preterm Abortions TAB SAB Ect Mult Living   10    1  1    0     Review of Systems  Constitutional: Negative for fever, activity change and appetite change.  HENT: Negative for rhinorrhea.   Respiratory: Negative for cough, chest tightness and shortness of breath.   Cardiovascular: Negative for chest pain.  Gastrointestinal: Negative for nausea, vomiting and abdominal pain.  Genitourinary: Negative for dysuria and hematuria.  Musculoskeletal: Positive for arthralgias, back pain, gait problem and myalgias.  Neurological: Negative for dizziness, weakness and headaches.  A complete 10 system review of systems was obtained and all systems are negative except as noted in the HPI and PMH.    Allergies  Codeine and Cymbalta  Home Medications   Current Outpatient Rx  Name  Route  Sig  Dispense  Refill  . HYDROcodone-acetaminophen (NORCO) 10-325 MG per tablet   Oral   Take 1 tablet by mouth every 6 (six) hours as needed.         Marland Kitchen  ibuprofen (ADVIL,MOTRIN) 600 MG tablet   Oral   Take 600 mg by mouth every 6 (six) hours as needed for mild pain or moderate pain.         . methocarbamol (ROBAXIN) 500 MG tablet   Oral   Take 500 mg by mouth 2 (two) times daily as needed for muscle spasms.         . predniSONE (DELTASONE) 10 MG tablet   Oral   Take 20 mg by mouth daily with breakfast.         . Prenatal Vit-Fe Fumarate-FA (PRENATAL MULTIVITAMIN) TABS tablet   Oral   Take 1 tablet by mouth daily at 12 noon.         Marland Kitchen zolpidem (AMBIEN) 10 MG tablet   Oral   Take 10 mg by mouth at bedtime as needed for sleep.          . diazepam (VALIUM) 5 MG tablet   Oral   Take 1 tablet (5 mg total) by mouth every 12 (twelve) hours as needed for muscle spasms.   10 tablet   0    BP 149/64  Pulse  75  Temp(Src) 99 F (37.2 C) (Oral)  Resp 16  Wt 292 lb (132.45 kg)  SpO2 100%  LMP 08/02/2013 Physical Exam  Constitutional: She is oriented to person, place, and time. She appears well-developed and well-nourished. She appears distressed.  Uncomfortable, standing up at side of bed.  HENT:  Head: Normocephalic and atraumatic.  Mouth/Throat: Oropharynx is clear and moist. No oropharyngeal exudate.  Eyes: Conjunctivae are normal. Pupils are equal, round, and reactive to light.  Neck: Normal range of motion. Neck supple.  Cardiovascular: Normal rate, regular rhythm and normal heart sounds.   No murmur heard. Pulmonary/Chest: Effort normal and breath sounds normal. No respiratory distress.  Abdominal: Soft. There is no tenderness. There is no rebound and no guarding.  Musculoskeletal: Normal range of motion. She exhibits tenderness.  Right paraspinal low back pain 5/5 strength in bilateral lower extremities. Ankle plantar and dorsiflexion intact. Great toe extension intact bilaterally. +2 DP and PT pulses. +2 patellar reflexes bilaterally. Normal gait. No calf asymmetry  Neurological: She is alert and oriented to person, place, and time. No cranial nerve deficit. She exhibits normal muscle tone. Coordination normal.  Skin: Skin is warm.    ED Course  Procedures (including critical care time) Labs Review Labs Reviewed  URINALYSIS, ROUTINE W REFLEX MICROSCOPIC  PREGNANCY, URINE   Imaging Review Dg Lumbar Spine Complete  08/12/2013   CLINICAL DATA:  Low back pain.  Numbness in the right leg.  EXAM: LUMBAR SPINE - COMPLETE 4+ VIEW  COMPARISON:  Lumbar spine MRI 09/18/2011.  FINDINGS: Five views of the lumbar spine demonstrates no definite acute displaced fracture or compression type fracture. No defects of the pars interarticularis. Mild straightening of normal lumbar lordosis. Alignment is otherwise anatomic. Multilevel degenerative disc disease, most severe at L3-L4, L4-L5 and L5-S1.  Mild multilevel facet arthropathy, most severe at L5-S1.  IMPRESSION: 1. No acute radiographic abnormality of the lumbar spine. 2. Multilevel degenerative disc disease and lumbar spondylosis, as above.   Electronically Signed   By: Trudie Reed M.D.   On: 08/12/2013 14:07    EKG Interpretation   None       MDM   1. Sciatica of right side    History and exam consistent with sciatica. Patient was equal strength in her lower extremity and equal sensation though very uncomfortable.  Doubt cord compression, cauda equina or epidural abscess. Doubt DVT.  No calf asymmetry, no calf tenderness. UA negative, HCG negative. Patient's pain is controlled with treatment in the ED. She is able to ambulate without assistance. Discussed with the patient that with her pain contract I cannot provide additional narcotics. Continue prednisone, robaxin.  Will add short course of valium.  She is stable for followup with her PCP.   Glynn Octave, MD 08/12/13 [phone]

## 2013-08-12 NOTE — ED Notes (Signed)
Voiced understanding of instructions given 

## 2013-08-12 NOTE — ED Notes (Signed)
Patient's fall-risk bracelet applied.

## 2013-08-12 NOTE — ED Provider Notes (Signed)
Medical screening examination/treatment/procedure(s) were performed by non-physician practitioner and as supervising physician I was immediately available for consultation/collaboration.  EKG Interpretation   None        Derwood Kaplan, MD 08/12/13 2219

## 2013-08-12 NOTE — ED Notes (Signed)
Pt reports sharp shooting pains , originating as a spasm in r/buttock and radiating down r/leg. R/leg feels weak and numb, increased pain and weakness when ambulating. Pt c/o intermittent weakness causing her knee to "buckle"

## 2013-09-14 ENCOUNTER — Encounter (HOSPITAL_COMMUNITY): Payer: Self-pay | Admitting: *Deleted

## 2013-09-14 ENCOUNTER — Inpatient Hospital Stay (HOSPITAL_COMMUNITY): Payer: BC Managed Care – PPO

## 2013-09-14 ENCOUNTER — Inpatient Hospital Stay (HOSPITAL_COMMUNITY)
Admission: AD | Admit: 2013-09-14 | Discharge: 2013-09-14 | Disposition: A | Discharge status: Home / Self Care | Payer: BC Managed Care – PPO | Source: Ambulatory Visit | Attending: Obstetrics and Gynecology | Admitting: Obstetrics and Gynecology

## 2013-09-14 DIAGNOSIS — O09521 Supervision of elderly multigravida, first trimester: Secondary | ICD-10-CM

## 2013-09-14 DIAGNOSIS — R0602 Shortness of breath: Secondary | ICD-10-CM | POA: Insufficient documentation

## 2013-09-14 DIAGNOSIS — O99891 Other specified diseases and conditions complicating pregnancy: Secondary | ICD-10-CM | POA: Insufficient documentation

## 2013-09-14 DIAGNOSIS — R059 Cough, unspecified: Secondary | ICD-10-CM

## 2013-09-14 DIAGNOSIS — J069 Acute upper respiratory infection, unspecified: Secondary | ICD-10-CM | POA: Insufficient documentation

## 2013-09-14 DIAGNOSIS — Z96659 Presence of unspecified artificial knee joint: Secondary | ICD-10-CM | POA: Insufficient documentation

## 2013-09-14 DIAGNOSIS — O99211 Obesity complicating pregnancy, first trimester: Secondary | ICD-10-CM

## 2013-09-14 DIAGNOSIS — M25569 Pain in unspecified knee: Secondary | ICD-10-CM | POA: Insufficient documentation

## 2013-09-14 DIAGNOSIS — Z86718 Personal history of other venous thrombosis and embolism: Secondary | ICD-10-CM

## 2013-09-14 DIAGNOSIS — R05 Cough: Secondary | ICD-10-CM | POA: Insufficient documentation

## 2013-09-14 LAB — CBC
HCT: 37.4 % (ref 36.0–46.0)
Hemoglobin: 12.6 g/dL (ref 12.0–15.0)
MCH: 26.3 pg (ref 26.0–34.0)
MCHC: 33.7 g/dL (ref 30.0–36.0)
MCV: 77.9 fL — ABNORMAL LOW (ref 78.0–100.0)
Platelets: 166 10*3/uL (ref 150–400)
RBC: 4.8 MIL/uL (ref 3.87–5.11)
RDW: 16.3 % — ABNORMAL HIGH (ref 11.5–15.5)
WBC: 11.2 10*3/uL — ABNORMAL HIGH (ref 4.0–10.5)

## 2013-09-14 LAB — COMPREHENSIVE METABOLIC PANEL
ALT: 14 U/L (ref 0–35)
AST: 16 U/L (ref 0–37)
Albumin: 3.2 g/dL — ABNORMAL LOW (ref 3.5–5.2)
Alkaline Phosphatase: 82 U/L (ref 39–117)
BUN: 10 mg/dL (ref 6–23)
CO2: 24 mEq/L (ref 19–32)
Calcium: 8.9 mg/dL (ref 8.4–10.5)
Chloride: 98 mEq/L (ref 96–112)
Creatinine, Ser: 0.78 mg/dL (ref 0.50–1.10)
GFR calc Af Amer: >90 mL/min (ref >90)
GFR calc non Af Amer: >90 mL/min (ref >90)
Glucose, Bld: 75 mg/dL (ref 70–99)
Potassium: 3.8 mEq/L (ref 3.5–5.1)
Sodium: 131 mEq/L — ABNORMAL LOW (ref 135–145)
Total Bilirubin: 0.2 mg/dL — ABNORMAL LOW (ref 0.3–1.2)
Total Protein: 7.3 g/dL (ref 6.0–8.3)

## 2013-09-14 LAB — PROTIME-INR
INR: 0.95 (ref 0.00–1.49)
Prothrombin Time: 12.5 seconds (ref 11.6–15.2)

## 2013-09-14 LAB — APTT: aPTT: 24 seconds (ref 24–37)

## 2013-09-14 MED ORDER — BENZONATATE 200 MG PO CAPS: 200.0000 mg | ORAL_CAPSULE | Freq: Three times a day (TID) | ORAL | Status: DC | PRN

## 2013-09-14 MED ORDER — ENOXAPARIN SODIUM 150 MG/ML ~~LOC~~ SOLN
1.0000 mg/kg | Freq: Once | SUBCUTANEOUS | Status: AC
  Administered 2013-09-14: 130 mg via SUBCUTANEOUS
  Filled 2013-09-14: qty 1

## 2013-09-14 MED ORDER — BENZONATATE 100 MG PO CAPS
200.0000 mg | ORAL_CAPSULE | Freq: Three times a day (TID) | ORAL | Status: DC | PRN
  Administered 2013-09-14: 200 mg via ORAL
  Filled 2013-09-14: qty 2

## 2013-09-14 MED ORDER — IOHEXOL 350 MG/ML SOLN
180.0000 mL | Freq: Once | INTRAVENOUS | Status: AC | PRN
  Administered 2013-09-14: 180 mL via INTRAVENOUS

## 2013-09-14 NOTE — Progress Notes (Signed)
H&P  37 yo G3P0 patient of Dr Chevis Pretty presents with C/O non productive cough for about 1-2 weeks. Also has pain behind right knee for 1-2 days. S/P bilateral knee replacements and had DVT with 5 day admission in left LE in about 2011. Notes her right calf is always larger than left after knee surgery. Recently stopped vaginal Crinone (progesterone) being used to support early pregnancy.   PMH includes chronic back pain, osteoarthritis, DVT, depression PSH includes bilateral knee replacements Meds PNV, Crinone now stopped,  All Codeine, Cymbalta  PE VSS Afeb Lungs CTA Cor RRR Abd obese soft NT Pelvic exam deferred LE: tender without palpable cord behind right knee, right calf slightly tender without cord       LLE NT without cord   Results for orders placed during the hospital encounter of 09/14/13 (from the past 24 hour(s))  CBC     Status: Abnormal   Collection Time    09/14/13  8:00 PM      Result Value Range   WBC 11.2 (*) 4.0 - 10.5 K/uL   RBC 4.80  3.87 - 5.11 MIL/uL   Hemoglobin 12.6  12.0 - 15.0 g/dL   HCT 16.1  09.6 - 04.5 %   MCV 77.9 (*) 78.0 - 100.0 fL   MCH 26.3  26.0 - 34.0 pg   MCHC 33.7  30.0 - 36.0 g/dL   RDW 40.9 (*) 81.1 - 91.4 %   Platelets 166  150 - 400 K/uL  COMPREHENSIVE METABOLIC PANEL     Status: Abnormal   Collection Time    09/14/13  8:00 PM      Result Value Range   Sodium 131 (*) 135 - 145 mEq/L   Potassium 3.8  3.5 - 5.1 mEq/L   Chloride 98  96 - 112 mEq/L   CO2 24  19 - 32 mEq/L   Glucose, Bld 75  70 - 99 mg/dL   BUN 10  6 - 23 mg/dL   Creatinine, Ser 7.82  0.50 - 1.10 mg/dL   Calcium 8.9  8.4 - 95.6 mg/dL   Total Protein 7.3  6.0 - 8.3 g/dL   Albumin 3.2 (*) 3.5 - 5.2 g/dL   AST 16  0 - 37 U/L   ALT 14  0 - 35 U/L   Alkaline Phosphatase 82  39 - 117 U/L   Total Bilirubin 0.2 (*) 0.3 - 1.2 mg/dL   GFR calc non Af Amer >90  >90 mL/min   GFR calc Af Amer >90  >90 mL/min  PROTIME-INR     Status: None   Collection Time    09/14/13  8:00 PM      Result Value Range   Prothrombin Time 12.5  11.6 - 15.2 seconds   INR 0.95  0.00 - 1.49  APTT     Status: None   Collection Time    09/14/13  8:00 PM      Result Value Range   aPTT 24  24 - 37 seconds   Pelvic U/S C/W 6 1/7 weeks IUP  Chest CT negative for PE  A: IUP at 6 1/7 weeks     URI     R/O DVT  P: Lovenox 1 mg/kg tonight     Dupplex scan of RLE in am     Tessalon prn cough      FU with Dr Chevis Pretty for pregnancy

## 2013-09-14 NOTE — MAU Note (Addendum)
States Hx of (L) DVT 2015, IP x 5 day, today left leg hurts, similar to other DVT Actually today (L) leg more swollen on exam and warm to touch Pt states (R) leg hurts with palpation and flexing of (r) foot Sore throat, cough,yesterday, and body aches today  Left leg (leg that had DVT in 2012 18" calf and warm Right Leg (leg with current pain) 17" and cool Pt on progesterone for SAB and she has read this may cause clots and in worried

## 2013-09-14 NOTE — Progress Notes (Signed)
Written and verbal d/c instructions given and understanding voiced. To go to [facility] in am at 8:30 for venous duplex study of R leg

## 2013-09-14 NOTE — Progress Notes (Signed)
Dr Henderson Cloud notified of lab results and u/s results. Will proceed with CT.

## 2013-09-15 ENCOUNTER — Ambulatory Visit (HOSPITAL_COMMUNITY): Payer: BC Managed Care – PPO | Attending: Obstetrics and Gynecology

## 2013-09-20 NOTE — L&D Delivery Note (Signed)
Operative Delivery Note At 10:26 PM a viable female was delivered via Vaginal, Spontaneous Delivery.  Presentation: vertex; Position: Occiput,, Anterior; Station: spont.  Delivery of the head: head to body del time under 2 min First maneuver:  04/29/2014 10:24 PM , McRoberts  Second maneuver: 04/29/2014 10:24 AM, Suprapubic Pressure  Third maneuver: ,corkscrew shoulders to oblique   Fourth maneuver: ,   Fifth maneuver: ,   Sixth maneuver: ,    Verbal consent: na.  APGAR: , ; weight .   Placenta status: Intact, Spontaneous.   Cord: 3 vessels with the following complications: None.  Cord pH: sent  Anesthesia: Epidural  Episiotomy: None Lacerations: sec deg + periurethral Suture Repair: 3.0 chromic Est. Blood Loss (mL):   Mom to postpartum.  Baby to Couplet care / Skin to Skin.  Margarette Asal 04/29/2014, 10:44 PM

## 2013-09-28 ENCOUNTER — Other Ambulatory Visit: Payer: Self-pay

## 2013-09-28 LAB — OB RESULTS CONSOLE RPR: RPR: NONREACTIVE

## 2013-09-28 LAB — OB RESULTS CONSOLE HIV ANTIBODY (ROUTINE TESTING): HIV: NONREACTIVE

## 2013-09-28 LAB — OB RESULTS CONSOLE ABO/RH: RH Type: POSITIVE

## 2013-09-28 LAB — OB RESULTS CONSOLE ANTIBODY SCREEN: Antibody Screen: NEGATIVE

## 2013-09-28 LAB — OB RESULTS CONSOLE RUBELLA ANTIBODY, IGM: Rubella: IMMUNE

## 2013-09-28 LAB — OB RESULTS CONSOLE HEPATITIS B SURFACE ANTIGEN: Hepatitis B Surface Ag: NEGATIVE

## 2013-10-17 ENCOUNTER — Encounter (HOSPITAL_COMMUNITY): Payer: Self-pay | Admitting: *Deleted

## 2013-10-17 ENCOUNTER — Inpatient Hospital Stay (HOSPITAL_COMMUNITY)
Admission: AD | Admit: 2013-10-17 | Discharge: 2013-10-17 | Disposition: A | Discharge status: Home / Self Care | Payer: BC Managed Care – PPO | Source: Ambulatory Visit | Attending: Obstetrics and Gynecology | Admitting: Obstetrics and Gynecology

## 2013-10-17 DIAGNOSIS — O34599 Maternal care for other abnormalities of gravid uterus, unspecified trimester: Secondary | ICD-10-CM | POA: Insufficient documentation

## 2013-10-17 DIAGNOSIS — E282 Polycystic ovarian syndrome: Secondary | ICD-10-CM | POA: Insufficient documentation

## 2013-10-17 DIAGNOSIS — Z86718 Personal history of other venous thrombosis and embolism: Secondary | ICD-10-CM | POA: Insufficient documentation

## 2013-10-17 DIAGNOSIS — O219 Vomiting of pregnancy, unspecified: Secondary | ICD-10-CM

## 2013-10-17 DIAGNOSIS — O210 Mild hyperemesis gravidarum: Secondary | ICD-10-CM | POA: Insufficient documentation

## 2013-10-17 LAB — URINALYSIS, ROUTINE W REFLEX MICROSCOPIC
Bilirubin Urine: NEGATIVE
Glucose, UA: NEGATIVE mg/dL
Hgb urine dipstick: NEGATIVE
Ketones, ur: NEGATIVE mg/dL
Leukocytes, UA: NEGATIVE
Nitrite: NEGATIVE
Protein, ur: NEGATIVE mg/dL
Specific Gravity, Urine: >1.03 — ABNORMAL HIGH (ref 1.005–1.030)
Urobilinogen, UA: 0.2 mg/dL (ref 0.0–1.0)
pH: 6 (ref 5.0–8.0)

## 2013-10-17 MED ORDER — PROMETHAZINE HCL 25 MG PO TABS: 25.0000 mg | ORAL_TABLET | Freq: Four times a day (QID) | ORAL | Status: DC | PRN

## 2013-10-17 MED ORDER — FAMOTIDINE IN NACL 20-0.9 MG/50ML-% IV SOLN
20.0000 mg | Freq: Once | INTRAVENOUS | Status: AC
  Administered 2013-10-17: 20 mg via INTRAVENOUS
  Filled 2013-10-17: qty 50

## 2013-10-17 MED ORDER — LACTATED RINGERS IV SOLN
Freq: Once | INTRAVENOUS | Status: AC
  Administered 2013-10-17: 16:00:00 via INTRAVENOUS
  Filled 2013-10-17: qty 1000

## 2013-10-17 MED ORDER — PROMETHAZINE HCL 25 MG/ML IJ SOLN
25.0000 mg | Freq: Once | INTRAMUSCULAR | Status: AC
  Administered 2013-10-17: 25 mg via INTRAVENOUS
  Filled 2013-10-17: qty 1

## 2013-10-17 NOTE — Discharge Instructions (Signed)
Morning Sickness °Morning sickness is when you feel sick to your stomach (nauseous) during pregnancy. This nauseous feeling may or may not come with vomiting. It often occurs in the morning but can be a problem any time of day. Morning sickness is most common during the first trimester, but it may continue throughout pregnancy. While morning sickness is unpleasant, it is usually harmless unless you develop severe and continual vomiting (hyperemesis gravidarum). This condition requires more intense treatment.  °CAUSES  °The cause of morning sickness is not completely known but seems to be related to normal hormonal changes that occur in pregnancy. °RISK FACTORS °You are at greater risk if you: °· Experienced nausea or vomiting before your pregnancy. °· Had morning sickness during a previous pregnancy. °· Are pregnant with more than one baby, such as twins. °TREATMENT  °Do not use any medicines (prescription, over-the-counter, or herbal) for morning sickness without first talking to your health care provider. Your health care provider may prescribe or recommend: °· Vitamin B6 supplements. °· Anti-nausea medicines. °· The herbal medicine ginger. °HOME CARE INSTRUCTIONS  °· Only take over-the-counter or prescription medicines as directed by your health care provider. °· Taking multivitamins before getting pregnant can prevent or decrease the severity of morning sickness in most women.   °· Eat a piece of dry toast or unsalted crackers before getting out of bed in the morning.   °· Eat five or six small meals a day.   °· Eat dry and bland foods (rice, baked potato ). Foods high in carbohydrates are often helpful.  °· Do not drink liquids with your meals. Drink liquids between meals.   °· Avoid greasy, fatty, and spicy foods.   °· Get someone to cook for you if the smell of any food causes nausea and vomiting.   °· If you feel nauseous after taking prenatal vitamins, take the vitamins at night or with a snack.  °· Snack  on protein foods (nuts, yogurt, cheese) between meals if you are hungry.   °· Eat unsweetened gelatins for desserts.   °· Wearing an acupressure wristband (worn for sea sickness) may be helpful.   °· Acupuncture may be helpful.   °· Do not smoke.   °· Get a humidifier to keep the air in your house free of odors.   °· Get plenty of fresh air. °SEEK MEDICAL CARE IF:  °· Your home remedies are not working, and you need medicine. °· You feel dizzy or lightheaded. °· You are losing weight. °SEEK IMMEDIATE MEDICAL CARE IF:  °· You have persistent and uncontrolled nausea and vomiting. °· You pass out (faint). °Document Released: 10/28/2006 Document Revised: 05/09/2013 Document Reviewed: 02/21/2013 °ExitCare® Patient Information ©2014 [employer]. ° °

## 2013-10-17 NOTE — MAU Provider Note (Signed)
History     CSN: [id number]  Arrival date and time: 10/17/13 1526   First Provider Initiated Contact with Patient 10/17/13 1559      Chief Complaint  Patient presents with  . Emesis During Pregnancy   HPI Comments: Ashley [name] 38 y.o. G3P0010 presents to MAU from Dr [facility] office with nausea and vomiting in early pregnancy. She is 10 weeks and 6 days. + FHT 162. She is unable to take Zofran due to extreme constipation. She was unable to give urine upon arrival.      Past Medical History  Diagnosis Date  . PCOS (polycystic ovarian syndrome)   . Endometrial polyp   . Infertility   . DVT (deep venous thrombosis) 08/26/2010    LEFT LEG  . Hypertension     BORDERLINE  . Miscarriage 03/2011  . Depression   . Back pain   . DVT (deep venous thrombosis) 2012    left leg    Past Surgical History  Procedure Laterality Date  . Knee surgery      X4-5  . Ankle surgery      X2  . 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 Hysteroscopy  2011    HYSTEROSCOPY, D&C  . Dilation and evacuation  04/02/2011    Procedure: DILATATION AND EVACUATION (D&E);  Surgeon: Luz Lex, MD;  Location: [facility];  Service: Gynecology;  Laterality: N/A;  dvt left mid thigh  . Dilation and curettage of uterus      poloyps    Family History  Problem Relation Age of Onset  . Diabetes Father   . Cancer Father     COLON  . Heart disease Father   . Diabetes Sister   . Cancer Maternal Grandmother     UTERINE    History  Substance Use Topics  . Smoking status: Never Smoker   . Smokeless tobacco: Not on file  . Alcohol Use: No    Allergies:  Allergies  Allergen Reactions  . Codeine Itching and Nausea Only  . Cymbalta [Duloxetine Hcl] Other (See Comments)    unknown    Prescriptions prior to admission  Medication Sig Dispense Refill  . Prenatal Vit-Fe Fumarate-FA (PRENATAL MULTIVITAMIN) TABS tablet Take 1 tablet by mouth daily at 12 noon.        Review of Systems   Constitutional: Negative.   HENT: Negative.   Respiratory: Negative.   Cardiovascular: Negative.   Gastrointestinal: Positive for nausea and vomiting.  Genitourinary: Negative.   Skin: Negative.   Neurological: Negative.   Psychiatric/Behavioral: Negative.    Physical Exam   Blood pressure 127/78, pulse 107, temperature 98.2 F (36.8 C), temperature source Oral, resp. rate 20, height 6' (1.829 m), weight 137.168 kg (302 lb 6.4 oz), last menstrual period 08/02/2013, SpO2 99.00%.  Physical Exam  Constitutional: She is oriented to person, place, and time. She appears well-developed and well-nourished. No distress.  HENT:  Head: Normocephalic and atraumatic.  Eyes: Pupils are equal, round, and reactive to light.  Cardiovascular: Normal rate, regular rhythm and normal heart sounds.   Respiratory: Effort normal and breath sounds normal.  Neurological: She is alert and oriented to person, place, and time.  Skin: Skin is warm and dry.  Psychiatric:  Flat affect   Results for orders placed during the hospital encounter of 10/17/13 (from the past 24 hour(s))  URINALYSIS, ROUTINE W REFLEX MICROSCOPIC     Status: Abnormal   Collection Time  10/17/13  4:10 PM      Result Value Range   Color, Urine YELLOW  YELLOW   APPearance CLEAR  CLEAR   Specific Gravity, Urine >1.030 (*) 1.005 - 1.030   pH 6.0  5.0 - 8.0   Glucose, UA NEGATIVE  NEGATIVE mg/dL   Hgb urine dipstick NEGATIVE  NEGATIVE   Bilirubin Urine NEGATIVE  NEGATIVE   Ketones, ur NEGATIVE  NEGATIVE mg/dL   Protein, ur NEGATIVE  NEGATIVE mg/dL   Urobilinogen, UA 0.2  0.0 - 1.0 mg/dL   Nitrite NEGATIVE  NEGATIVE   Leukocytes, UA NEGATIVE  NEGATIVE     MAU Course  Procedures  MDM  IVF LR Phenergan 25 mg Pepcid 20 mg INPB  Assessment and Plan   A: Nausea and vomiting in pregnancy  P: Add phenergan 25 mg q 6 hours prn nausea Fluids/ rest Follow up with Dr Alphonzo Dublin, Mil[place] Kocher 10/17/2013, 6:08 PM

## 2013-10-17 NOTE — MAU Note (Signed)
Patient presents to MAU from [facility] office for nausea and vomiting.

## 2013-10-23 ENCOUNTER — Ambulatory Visit (HOSPITAL_COMMUNITY): Payer: BC Managed Care – PPO

## 2013-10-30 ENCOUNTER — Ambulatory Visit (HOSPITAL_COMMUNITY)
Admission: RE | Admit: 2013-10-30 | Discharge: 2013-10-30 | Disposition: A | Discharge status: Home / Self Care | Payer: BC Managed Care – PPO | Source: Ambulatory Visit | Attending: Obstetrics and Gynecology | Admitting: Obstetrics and Gynecology

## 2013-10-30 ENCOUNTER — Encounter (HOSPITAL_COMMUNITY): Payer: Self-pay

## 2013-10-30 VITALS — BP 123/77 | HR 106 | Wt 302.0 lb

## 2013-10-30 DIAGNOSIS — Z86718 Personal history of other venous thrombosis and embolism: Secondary | ICD-10-CM | POA: Insufficient documentation

## 2013-10-30 DIAGNOSIS — O99891 Other specified diseases and conditions complicating pregnancy: Secondary | ICD-10-CM | POA: Insufficient documentation

## 2013-10-30 DIAGNOSIS — Z8759 Personal history of other complications of pregnancy, childbirth and the puerperium: Secondary | ICD-10-CM

## 2013-10-30 DIAGNOSIS — O9989 Other specified diseases and conditions complicating pregnancy, childbirth and the puerperium: Principal | ICD-10-CM

## 2013-10-30 DIAGNOSIS — Z96659 Presence of unspecified artificial knee joint: Secondary | ICD-10-CM | POA: Insufficient documentation

## 2013-10-30 DIAGNOSIS — O09529 Supervision of elderly multigravida, unspecified trimester: Secondary | ICD-10-CM | POA: Insufficient documentation

## 2013-10-30 LAB — ANTITHROMBIN III: AntiThromb III Func: 86 % (ref 75–120)

## 2013-10-30 NOTE — Consult Note (Signed)
Maternal Fetal Medicine Consultation  Requesting Provider(s): Daleen Bo. Lyn Hollingshead, MD  Reason for consultation: Hx of DVT, ? Lovenox prophylaxis  HPI: Ashley [name] is a 38 yo G4P1021, EDD 05/05/2014 currently at 13 2/7 weeks who was seen for consultation due to hx of previous DVT for recommendations regarding anticoagulation.  The patient reports that she underwent Left knee replacement in May of 2011.  She was diagnosed with a DVT in December 2011 (6 months later) and was using Novaring for birth control at that time.  She was seen by Cardiology but to her knowledge, never underwent a thrombophelia work up nor was she seen by Hematology. I reviewed labs since 2001 in the [facility] system - no thrombophelia work up is noted. She was treated with Warfarin for 6 months after the diagnosis and has not had any ongoing problems since that time.  During her pregnancy in 2012, she was briefly treated with Lovenox prior to the diagnosis of a spontaneous abortion.  The patient reports that she has had ongoing problems with nausea and vomiting of early pregnancy, but is otherwise without complaints.  The patient has a history of a prior 35 week delivery associated with PROM - plans to being 17-P injections at 16-20 weeks.  OB History: OB History   Grav Para Term Preterm Abortions TAB SAB Ect Mult Living   3    1  1    0    G1 - SVD at 35 weeks - PROM G2 - TAB G3 - early SAB  PMH:  Past Medical History  Diagnosis Date  . PCOS (polycystic ovarian syndrome)   . Endometrial polyp   . Infertility   . DVT (deep venous thrombosis) 08/26/2010    LEFT LEG  . Hypertension     BORDERLINE  . Miscarriage 03/2011  . Depression   . Back pain   . DVT (deep venous thrombosis) 2012    left leg    PSH:  Past Surgical History  Procedure Laterality Date  . Knee surgery      X4-5  . Ankle surgery      X2  . 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 Hysteroscopy  2011    HYSTEROSCOPY, D&C  .  Dilation and evacuation  04/02/2011    Procedure: DILATATION AND EVACUATION (D&E);  Surgeon: Luz Lex, MD;  Location: [facility];  Service: Gynecology;  Laterality: N/A;  dvt left mid thigh  . Dilation and curettage of uterus      poloyps   Meds:  Current Outpatient Prescriptions on File Prior to Encounter  Medication Sig Dispense Refill  . Prenatal Vit-Fe Fumarate-FA (PRENATAL MULTIVITAMIN) TABS tablet Take 1 tablet by mouth daily at 12 noon.      . promethazine (PHENERGAN) 25 MG tablet Take 1 tablet (25 mg total) by mouth every 6 (six) hours as needed for nausea or vomiting.  30 tablet  1   No current facility-administered medications on file prior to encounter.   Allergies:  Allergies  Allergen Reactions  . Codeine Itching and Nausea Only  . Cymbalta [Duloxetine Hcl] Other (See Comments)    unknown   FH:  Family History  Problem Relation Age of Onset  . Diabetes Father   . Cancer Father     COLON  . Heart disease Father   . Diabetes Sister   . Cancer Maternal Grandmother     UTERINE   Soc:  History   Social History  .  Marital Status: Legally Separated    Spouse Name: N/A    Number of Children: N/A  . Years of Education: N/A   Occupational History  . Not on file.   Social History Main Topics  . Smoking status: Never Smoker   . Smokeless tobacco: Not on file  . Alcohol Use: No  . Drug Use: No  . Sexual Activity: Yes    Birth Control/ Protection: None   Other Topics Concern  . Not on file   Social History Narrative  . No narrative on file    Review of Systems: no vaginal bleeding or cramping/contractions, no LOF, no nausea/vomiting. All other systems reviewed and are negative.   PE:   Filed Vitals:   10/30/13 0826  BP: 123/77  Pulse: 106    GEN: well-appearing female ABD: gravid, NT, + fetal heart tones in the 150's   A/P: 1) Single IUP at 13 2/7 weeks         2) Hx of DVT - the patient's documented DVT occurred some 6 months after her Left  knee replacement surgery - feel that it would be difficult to attribute the DVT to her surgery given the timing.  Based on reviews of the [facility] chart and what the patient described, I cannot document any previous thrombophelia work up. Based on the patient's history, I would recommend Lovenox prophylaxis (40 mg daily) -  Prescription was given.  The patient reports that she is comfortable given herself the injections as she did this in her last pregnancy.  Our nursing staff would be happy to work with her if desired.  Additionally recommend a thrombophelia work up - labs were drawn for anticardiolipin antibodies, Lupus anticoagulant, anti beta2 glycoproteins, Protein S/C, antithrombin III, FV Leiden and Prothrombin mutation.  We will contact her with the results as soon as they are available.        3) Hx of PROM, delivery at 35 weeks - would recommend 17-P injections beginning at 16-20 weeks        4) Advanced maternal age - recommend detailed ultrasound +/- genetic counseling at 18-20 weeks.  Please contact our office if you would prefer that this screening be performed in our office.   Thank you for the opportunity to be a part of the care of LAVONDA THAL. Please contact our office if we can be of further assistance.   I spent approximately 30 minutes with this patient with over 50% of time spent in face-to-face counseling.  Benjaman Lobe, MD Maternal Fetal Medicine

## 2013-10-30 NOTE — ED Notes (Signed)
Prescription called into [facility] on [address] for Lovenox 40mg  SQ daily.

## 2013-10-31 LAB — PROTEIN S ACTIVITY: Protein S Activity: 29 % — ABNORMAL LOW (ref 69–129)

## 2013-10-31 LAB — LUPUS ANTICOAGULANT PANEL
DRVVT: 38.6 s
Lupus Anticoagulant: NOT DETECTED
PTT Lupus Anticoagulant: 29.8 s (ref 28.0–43.0)

## 2013-10-31 LAB — PROTEIN C ACTIVITY: Protein C Activity: 181 % — ABNORMAL HIGH (ref 75–133)

## 2013-10-31 LAB — BETA-2-GLYCOPROTEIN I ABS, IGG/M/A
Beta-2 Glyco I IgG: 7 G Units
Beta-2-Glycoprotein I IgA: 3 A Units
Beta-2-Glycoprotein I IgM: 7 M Units

## 2013-10-31 LAB — PROTEIN S, TOTAL: Protein S Ag, Total: 57 % — ABNORMAL LOW (ref 60–150)

## 2013-10-31 LAB — CARDIOLIPIN ANTIBODIES, IGG, IGM, IGA
Anticardiolipin IgA: 9 U/mL — ABNORMAL LOW
Anticardiolipin IgG: 14 GPL U/mL
Anticardiolipin IgM: 3 [MPL'U]/mL — ABNORMAL LOW

## 2013-10-31 LAB — PROTEIN C, TOTAL: Protein C, Total: 122 % (ref 72–160)

## 2013-11-01 LAB — FACTOR 5 LEIDEN

## 2013-11-01 LAB — PROTHROMBIN GENE MUTATION

## 2013-11-01 NOTE — Addendum Note (Signed)
Encounter addended by: Clarene Duke, RN on: 11/01/2013 11:26 AM<BR>     Documentation filed: Charges VN

## 2013-11-02 ENCOUNTER — Telehealth (HOSPITAL_COMMUNITY): Payer: Self-pay | Admitting: *Deleted

## 2013-11-19 ENCOUNTER — Encounter (HOSPITAL_COMMUNITY): Payer: Self-pay | Admitting: *Deleted

## 2013-11-19 ENCOUNTER — Inpatient Hospital Stay (HOSPITAL_COMMUNITY)
Admission: AD | Admit: 2013-11-19 | Discharge: 2013-11-19 | Disposition: A | Discharge status: Home / Self Care | Payer: BC Managed Care – PPO | Source: Ambulatory Visit | Attending: Obstetrics and Gynecology | Admitting: Obstetrics and Gynecology

## 2013-11-19 DIAGNOSIS — O210 Mild hyperemesis gravidarum: Secondary | ICD-10-CM | POA: Insufficient documentation

## 2013-11-19 DIAGNOSIS — R109 Unspecified abdominal pain: Secondary | ICD-10-CM | POA: Insufficient documentation

## 2013-11-19 DIAGNOSIS — O219 Vomiting of pregnancy, unspecified: Secondary | ICD-10-CM

## 2013-11-19 DIAGNOSIS — Z86718 Personal history of other venous thrombosis and embolism: Secondary | ICD-10-CM | POA: Insufficient documentation

## 2013-11-19 DIAGNOSIS — M549 Dorsalgia, unspecified: Secondary | ICD-10-CM | POA: Insufficient documentation

## 2013-11-19 LAB — URINALYSIS, ROUTINE W REFLEX MICROSCOPIC
Bilirubin Urine: NEGATIVE
Glucose, UA: NEGATIVE mg/dL
Hgb urine dipstick: NEGATIVE
Ketones, ur: 15 mg/dL — AB
Leukocytes, UA: NEGATIVE
Nitrite: NEGATIVE
Protein, ur: 30 mg/dL — AB
Specific Gravity, Urine: >1.03 — ABNORMAL HIGH (ref 1.005–1.030)
Urobilinogen, UA: 1 mg/dL (ref 0.0–1.0)
pH: 6.5 (ref 5.0–8.0)

## 2013-11-19 LAB — URINE MICROSCOPIC-ADD ON

## 2013-11-19 MED ORDER — PROMETHAZINE HCL 25 MG/ML IJ SOLN
25.0000 mg | Freq: Once | INTRAVENOUS | Status: AC
  Administered 2013-11-19: 25 mg via INTRAVENOUS
  Filled 2013-11-19: qty 1

## 2013-11-19 MED ORDER — LACTATED RINGERS IV BOLUS (SEPSIS): 1000.0000 mL | Freq: Once | INTRAVENOUS | Status: DC

## 2013-11-19 NOTE — Discharge Instructions (Signed)
Hyperemesis Gravidarum Diet Hyperemesis gravidarum is a severe form of morning sickness. It is characterized by frequent and severe vomiting. It happens during the first trimester of pregnancy. It may be caused by the rapid hormone changes that happen during pregnancy. It is associated with a 5% weight loss of pre-pregnancy weight. The hyperemesis diet may be used to lessen symptoms of nausea and vomiting. EATING GUIDELINES  Eat 5 to 6 small meals daily instead of 3 large meals.  Avoid foods with strong smells.  Avoid drinking 30 minutes before and after meals.  Avoid fried or high-fat foods, such as butter and cream sauces.  Starchy foods are usually well-tolerated, such as cereal, toast, bread, potatoes, pasta, rice, and pretzels.  Eat crackers before you get out of bed in the morning.  Avoid spicy foods.  Ginger may help with nausea. Add  tsp ginger to hot tea or choose ginger tea.  Continue to take your prenatal vitamins as directed by your caregiver. SAMPLE MEAL PLAN Breakfast    cup oatmeal  1 slice toast  1 tsp heart-healthy margarine  1 tsp jelly  1 scrambled egg Midmorning Snack   1 cup low-fat yogurt Lunch   Plain ham sandwich  Carrot or celery sticks  1 small apple  3 graham crackers Midafternoon Snack   Cheese and crackers Dinner  4 oz pork tenderloin  1 small baked potato  1 tsp margarine   cup broccoli   cup grapes Evening Snack  1 cup pudding Document Released: 07/04/2007 Document Revised: 11/29/2011 Document Reviewed: 02/06/2013 ExitCare Patient Information 2014 [place], [place].

## 2013-11-19 NOTE — MAU Note (Signed)
Patient states she was in the office today and sent to MAU for IVF's.

## 2013-11-19 NOTE — MAU Provider Note (Signed)
History     CSN: [id number]  Arrival date and time: 11/19/13 1556   First Provider Initiated Contact with Patient 11/19/13 1643      Chief Complaint  Patient presents with  . Emesis During Pregnancy  . Abdominal Pain  . Back Pain   HPI [name] is a 38 y.o. G3P0010 at [redacted]w[redacted]d who presents to MAU today from the office with complaint of N/V. The patient has had N/V throughout the pregnancy and is taking Phenergan at home. The last dose was Saturday night. She states N/V worse x 3 days. She denies diarrhea, constipation, fever, UTI symptoms or sick contacts.   OB History   Grav Para Term Preterm Abortions TAB SAB Ect Mult Living   3    1  1    0      Past Medical History  Diagnosis Date  . PCOS (polycystic ovarian syndrome)   . Endometrial polyp   . Infertility   . DVT (deep venous thrombosis) 08/26/2010    LEFT LEG  . Hypertension     BORDERLINE  . Miscarriage 03/2011  . Depression   . Back pain   . DVT (deep venous thrombosis) 2012    left leg    Past Surgical History  Procedure Laterality Date  . Knee surgery      X4-5  . Ankle surgery      X2  . 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 Hysteroscopy  2011    HYSTEROSCOPY, D&C  . Dilation and evacuation  04/02/2011    Procedure: DILATATION AND EVACUATION (D&E);  Surgeon: Luz Lex, MD;  Location: [facility];  Service: Gynecology;  Laterality: N/A;  dvt left mid thigh  . Dilation and curettage of uterus      poloyps    Family History  Problem Relation Age of Onset  . Diabetes Father   . Cancer Father     COLON  . Heart disease Father   . Diabetes Sister   . Cancer Maternal Grandmother     UTERINE    History  Substance Use Topics  . Smoking status: Never Smoker   . Smokeless tobacco: Not on file  . Alcohol Use: No    Allergies:  Allergies  Allergen Reactions  . Codeine Itching and Nausea Only  . Cymbalta [Duloxetine Hcl] Other (See Comments)    unknown    Prescriptions  prior to admission  Medication Sig Dispense Refill  . Prenatal Vit-Fe Fumarate-FA (PRENATAL MULTIVITAMIN) TABS tablet Take 1 tablet by mouth daily at 12 noon.      . promethazine (PHENERGAN) 25 MG tablet Take 1 tablet (25 mg total) by mouth every 6 (six) hours as needed for nausea or vomiting.  30 tablet  1    Review of Systems  Constitutional: Positive for malaise/fatigue. Negative for fever.  Gastrointestinal: Positive for nausea, vomiting and abdominal pain. Negative for diarrhea and constipation.  Genitourinary: Negative for dysuria, urgency and frequency.       Neg - vaginal bleeding, discharge  Neurological: Negative for dizziness, loss of consciousness and weakness.   Physical Exam   Blood pressure 130/80, pulse 108, temperature 99.2 F (37.3 C), temperature source Oral, resp. rate 20, height 5' 11.5" (1.816 m), weight 301 lb (136.533 kg), last menstrual period 08/02/2013, SpO2 98.00%.  Physical Exam  Constitutional: She appears well-developed and well-nourished. No distress.  HENT:  Head: Normocephalic and atraumatic.  Cardiovascular: Normal rate.   Respiratory: Effort normal.  GI: Soft. Bowel sounds are normal. She exhibits no distension and no mass. There is tenderness (mild tenderness to palpation of the lower abdomen bilaterally). There is no rebound and no guarding.  Neurological: She is alert.  Skin: Skin is warm and dry. No erythema.  Psychiatric: She has a normal mood and affect.   Results for orders placed during the hospital encounter of 11/19/13 (from the past 24 hour(s))  URINALYSIS, ROUTINE W REFLEX MICROSCOPIC     Status: Abnormal   Collection Time    11/19/13  4:24 PM      Result Value Ref Range   Color, Urine YELLOW  YELLOW   APPearance CLOUDY (*) CLEAR   Specific Gravity, Urine >1.030 (*) 1.005 - 1.030   pH 6.5  5.0 - 8.0   Glucose, UA NEGATIVE  NEGATIVE mg/dL   Hgb urine dipstick NEGATIVE  NEGATIVE   Bilirubin Urine NEGATIVE  NEGATIVE   Ketones, ur  15 (*) NEGATIVE mg/dL   Protein, ur 30 (*) NEGATIVE mg/dL   Urobilinogen, UA 1.0  0.0 - 1.0 mg/dL   Nitrite NEGATIVE  NEGATIVE   Leukocytes, UA NEGATIVE  NEGATIVE  URINE MICROSCOPIC-ADD ON     Status: Abnormal   Collection Time    11/19/13  4:24 PM      Result Value Ref Range   Squamous Epithelial / LPF FEW (*) RARE   WBC, UA 0-2  <3 WBC/hpf    MAU Course  Procedures None  MDM UA today 25 mg Phenergan given in 1 liter IV LR Discussed with Dr. Julien Girt. [facility] for discharge. She sent refill for Phenergan, Diclegis and Pepcid from the office today.   Assessment and Plan  A: Nausea and vomiting in pregnancy prior to [redacted] weeks gestation  P: Discharge home Patient advised to increase PO hydration and advance diet as tolerated Patient advised to take previously prescribed anti-emetics Patient may return to MAU as needed or if her condition were to change or worsen  Farris Has, PA-C  11/19/2013, 6:50 PM

## 2013-11-19 NOTE — MAU Note (Signed)
Patient states she has been having vomiting for a while with the pregnancy, has been worse for 3 days and not able to keep anything down for long. Having some back and abdominal pain made worse with the vomiting. Denies bleeding or discharge.

## 2013-11-20 ENCOUNTER — Telehealth (HOSPITAL_COMMUNITY): Payer: Self-pay | Admitting: *Deleted

## 2013-12-15 ENCOUNTER — Encounter (HOSPITAL_COMMUNITY): Payer: Self-pay | Admitting: Emergency Medicine

## 2013-12-15 ENCOUNTER — Encounter (HOSPITAL_COMMUNITY): Payer: Self-pay | Admitting: *Deleted

## 2013-12-15 ENCOUNTER — Inpatient Hospital Stay (EMERGENCY_DEPARTMENT_HOSPITAL)
Admission: AD | Admit: 2013-12-15 | Discharge: 2013-12-15 | Disposition: A | Discharge status: Home / Self Care | Payer: BC Managed Care – PPO | Source: Ambulatory Visit | Attending: Obstetrics and Gynecology | Admitting: Obstetrics and Gynecology

## 2013-12-15 ENCOUNTER — Emergency Department (HOSPITAL_COMMUNITY)
Admission: EM | Admit: 2013-12-15 | Discharge: 2013-12-15 | Disposition: A | Discharge status: Home / Self Care | Payer: BC Managed Care – PPO | Attending: Emergency Medicine | Admitting: Emergency Medicine

## 2013-12-15 DIAGNOSIS — J45909 Unspecified asthma, uncomplicated: Secondary | ICD-10-CM | POA: Insufficient documentation

## 2013-12-15 DIAGNOSIS — R05 Cough: Secondary | ICD-10-CM | POA: Insufficient documentation

## 2013-12-15 DIAGNOSIS — Z79899 Other long term (current) drug therapy: Secondary | ICD-10-CM | POA: Insufficient documentation

## 2013-12-15 DIAGNOSIS — O368190 Decreased fetal movements, unspecified trimester, not applicable or unspecified: Secondary | ICD-10-CM

## 2013-12-15 DIAGNOSIS — I10 Essential (primary) hypertension: Secondary | ICD-10-CM | POA: Insufficient documentation

## 2013-12-15 DIAGNOSIS — M549 Dorsalgia, unspecified: Secondary | ICD-10-CM

## 2013-12-15 DIAGNOSIS — O210 Mild hyperemesis gravidarum: Secondary | ICD-10-CM | POA: Insufficient documentation

## 2013-12-15 DIAGNOSIS — O99891 Other specified diseases and conditions complicating pregnancy: Secondary | ICD-10-CM | POA: Insufficient documentation

## 2013-12-15 DIAGNOSIS — Z8742 Personal history of other diseases of the female genital tract: Secondary | ICD-10-CM | POA: Insufficient documentation

## 2013-12-15 DIAGNOSIS — O9989 Other specified diseases and conditions complicating pregnancy, childbirth and the puerperium: Secondary | ICD-10-CM

## 2013-12-15 DIAGNOSIS — J9801 Acute bronchospasm: Secondary | ICD-10-CM

## 2013-12-15 DIAGNOSIS — Z8659 Personal history of other mental and behavioral disorders: Secondary | ICD-10-CM | POA: Insufficient documentation

## 2013-12-15 DIAGNOSIS — J40 Bronchitis, not specified as acute or chronic: Secondary | ICD-10-CM | POA: Insufficient documentation

## 2013-12-15 DIAGNOSIS — R059 Cough, unspecified: Secondary | ICD-10-CM | POA: Insufficient documentation

## 2013-12-15 DIAGNOSIS — Z86718 Personal history of other venous thrombosis and embolism: Secondary | ICD-10-CM | POA: Insufficient documentation

## 2013-12-15 LAB — I-STAT CHEM 8, ED
BUN: 4 mg/dL — ABNORMAL LOW (ref 6–23)
Calcium, Ion: 1.14 mmol/L (ref 1.12–1.23)
Chloride: 102 mEq/L (ref 96–112)
Creatinine, Ser: 0.8 mg/dL (ref 0.50–1.10)
Glucose, Bld: 87 mg/dL (ref 70–99)
HCT: 34 % — ABNORMAL LOW (ref 36.0–46.0)
Hemoglobin: 11.6 g/dL — ABNORMAL LOW (ref 12.0–15.0)
Potassium: 3.6 mEq/L — ABNORMAL LOW (ref 3.7–5.3)
Sodium: 136 mEq/L — ABNORMAL LOW (ref 137–147)
TCO2: 22 mmol/L (ref 0–100)

## 2013-12-15 LAB — CBC WITH DIFFERENTIAL/PLATELET
Basophils Absolute: 0 10*3/uL (ref 0.0–0.1)
Basophils Relative: 0 % (ref 0–1)
Eosinophils Absolute: 0.2 10*3/uL (ref 0.0–0.7)
Eosinophils Relative: 2 % (ref 0–5)
HCT: 34 % — ABNORMAL LOW (ref 36.0–46.0)
Hemoglobin: 11.3 g/dL — ABNORMAL LOW (ref 12.0–15.0)
Lymphocytes Relative: 17 % (ref 12–46)
Lymphs Abs: 1.2 10*3/uL (ref 0.7–4.0)
MCH: 26.7 pg (ref 26.0–34.0)
MCHC: 33.2 g/dL (ref 30.0–36.0)
MCV: 80.2 fL (ref 78.0–100.0)
Monocytes Absolute: 0.7 10*3/uL (ref 0.1–1.0)
Monocytes Relative: 9 % (ref 3–12)
Neutro Abs: 5 10*3/uL (ref 1.7–7.7)
Neutrophils Relative %: 71 % (ref 43–77)
Platelets: 155 10*3/uL (ref 150–400)
RBC: 4.24 MIL/uL (ref 3.87–5.11)
RDW: 14.4 % (ref 11.5–15.5)
WBC: 7 10*3/uL (ref 4.0–10.5)

## 2013-12-15 LAB — URINALYSIS, ROUTINE W REFLEX MICROSCOPIC
Bilirubin Urine: NEGATIVE
Glucose, UA: NEGATIVE mg/dL
Hgb urine dipstick: NEGATIVE
Ketones, ur: NEGATIVE mg/dL
Leukocytes, UA: NEGATIVE
Nitrite: NEGATIVE
Protein, ur: NEGATIVE mg/dL
Specific Gravity, Urine: 1.02 (ref 1.005–1.030)
Urobilinogen, UA: 1 mg/dL (ref 0.0–1.0)
pH: 8 (ref 5.0–8.0)

## 2013-12-15 LAB — INFLUENZA PANEL BY PCR (TYPE A & B)
H1N1 flu by pcr: NOT DETECTED
Influenza A By PCR: NEGATIVE
Influenza B By PCR: NEGATIVE

## 2013-12-15 MED ORDER — OSELTAMIVIR PHOSPHATE 75 MG PO CAPS: 75.0000 mg | ORAL_CAPSULE | Freq: Two times a day (BID) | ORAL | Status: DC

## 2013-12-15 MED ORDER — CEFDINIR 300 MG PO CAPS: 300.0000 mg | ORAL_CAPSULE | Freq: Two times a day (BID) | ORAL | Status: DC

## 2013-12-15 MED ORDER — PREDNISONE 20 MG PO TABS: 60.0000 mg | ORAL_TABLET | Freq: Every day | ORAL | Status: DC

## 2013-12-15 MED ORDER — ALBUTEROL SULFATE (2.5 MG/3ML) 0.083% IN NEBU
2.5000 mg | INHALATION_SOLUTION | Freq: Once | RESPIRATORY_TRACT | Status: AC
  Administered 2013-12-15: 2.5 mg via RESPIRATORY_TRACT
  Filled 2013-12-15: qty 3

## 2013-12-15 MED ORDER — DEXTROSE 5 % IV SOLN
1.0000 g | Freq: Once | INTRAVENOUS | Status: AC
  Administered 2013-12-15: 1 g via INTRAVENOUS
  Filled 2013-12-15: qty 10

## 2013-12-15 MED ORDER — ALBUTEROL SULFATE HFA 108 (90 BASE) MCG/ACT IN AERS
2.0000 | INHALATION_SPRAY | RESPIRATORY_TRACT | Status: DC | PRN
  Administered 2013-12-15: 2 via RESPIRATORY_TRACT
  Filled 2013-12-15: qty 6.7

## 2013-12-15 MED ORDER — IPRATROPIUM BROMIDE 0.02 % IN SOLN
0.5000 mg | Freq: Once | RESPIRATORY_TRACT | Status: AC
  Administered 2013-12-15: 0.5 mg via RESPIRATORY_TRACT
  Filled 2013-12-15: qty 2.5

## 2013-12-15 MED ORDER — ALBUTEROL SULFATE (2.5 MG/3ML) 0.083% IN NEBU
5.0000 mg | INHALATION_SOLUTION | Freq: Once | RESPIRATORY_TRACT | Status: AC
  Administered 2013-12-15: 5 mg via RESPIRATORY_TRACT
  Filled 2013-12-15: qty 6

## 2013-12-15 MED ORDER — SODIUM CHLORIDE 0.9 % IV BOLUS (SEPSIS)
500.0000 mL | Freq: Once | INTRAVENOUS | Status: AC
  Administered 2013-12-15: 500 mL via INTRAVENOUS

## 2013-12-15 MED ORDER — ONDANSETRON HCL 4 MG/2ML IJ SOLN
4.0000 mg | Freq: Once | INTRAMUSCULAR | Status: AC
  Administered 2013-12-15: 4 mg via INTRAVENOUS
  Filled 2013-12-15: qty 2

## 2013-12-15 MED ORDER — OSELTAMIVIR PHOSPHATE 75 MG PO CAPS
75.0000 mg | ORAL_CAPSULE | Freq: Once | ORAL | Status: AC
  Administered 2013-12-15: 75 mg via ORAL
  Filled 2013-12-15: qty 1

## 2013-12-15 NOTE — MAU Note (Signed)
Pt presents with complaints of nausea, vomiting, back pain, and cough with congestion for 3 weeks

## 2013-12-15 NOTE — MAU Note (Signed)
[facility] called and spoke with Chong Sicilian RN charge nurse and made her aware of pt coming to evaluated by Dr Linton Rump.

## 2013-12-15 NOTE — ED Provider Notes (Signed)
CSN: [id number]     Arrival date & time 12/15/13  1634 History   First MD Initiated Contact with Patient 12/15/13 1716     Chief Complaint  Patient presents with  . Cough     (Consider location/radiation/quality/duration/timing/severity/associated sxs/prior Treatment) HPI Comments: Patient sent to the ER from [facility] for evaluation of persistent cough, wheezing. Patient has been sick for 3 weeks. She was given Zithromax, cough medicine but hasn't improved. She was also started on an inhaler. She is seen today with increased wheezing, given a breathing treatment and then transferred to the ER. Patient reports persistent cough. She has not noticed any fevers at home, but she does have generalized body aches.  Patient is [redacted] weeks pregnant. No pelvic pain, bleeding or temperature paresthetica complications at this time.  Patient is a 38 y.o. female presenting with cough.  Cough Associated symptoms: chills, myalgias, shortness of breath and wheezing     Past Medical History  Diagnosis Date  . PCOS (polycystic ovarian syndrome)   . Endometrial polyp   . Infertility   . DVT (deep venous thrombosis) 08/26/2010    LEFT LEG  . Hypertension     BORDERLINE  . Miscarriage 03/2011  . Depression   . Back pain   . DVT (deep venous thrombosis) 2012    left leg   Past Surgical History  Procedure Laterality Date  . Knee surgery      X4-5  . Ankle surgery      X2  . 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 Hysteroscopy  2011    HYSTEROSCOPY, D&C  . Dilation and evacuation  04/02/2011    Procedure: DILATATION AND EVACUATION (D&E);  Surgeon: Luz Lex, MD;  Location: [facility];  Service: Gynecology;  Laterality: N/A;  dvt left mid thigh  . Dilation and curettage of uterus      poloyps   Family History  Problem Relation Age of Onset  . Diabetes Father   . Cancer Father     COLON  . Heart disease Father   . Diabetes Sister   . Cancer Maternal Grandmother     UTERINE    History  Substance Use Topics  . Smoking status: Never Smoker   . Smokeless tobacco: Not on file  . Alcohol Use: No   OB History   Grav Para Term Preterm Abortions TAB SAB Ect Mult Living   3    1  1    0     Review of Systems  Constitutional: Positive for chills.  HENT: Positive for congestion.   Respiratory: Positive for cough, shortness of breath and wheezing.   Musculoskeletal: Positive for myalgias.  All other systems reviewed and are negative.      Allergies  Cymbalta  Home Medications   Current Outpatient Rx  Name  Route  Sig  Dispense  Refill  . albuterol (PROVENTIL HFA;VENTOLIN HFA) 108 (90 BASE) MCG/ACT inhaler   Inhalation   Inhale 1-2 puffs into the lungs every 6 (six) hours as needed for wheezing or shortness of breath.         . enoxaparin (LOVENOX) 40 MG/0.4ML injection   Subcutaneous   Inject 40 mg into the skin daily.         . Prenatal Vit-Fe Fumarate-FA (PRENATAL MULTIVITAMIN) TABS tablet   Oral   Take 1 tablet by mouth every morning.          . promethazine (PHENERGAN) 25 MG tablet  Oral   Take 1 tablet (25 mg total) by mouth every 6 (six) hours as needed for nausea or vomiting.   30 tablet   1   . promethazine-codeine (PHENERGAN WITH CODEINE) 6.25-10 MG/5ML syrup   Oral   Take 5 mLs by mouth every 6 (six) hours as needed for cough.         . cefdinir (OMNICEF) 300 MG capsule   Oral   Take 1 capsule (300 mg total) by mouth 2 (two) times daily.   20 capsule   0   . oseltamivir (TAMIFLU) 75 MG capsule   Oral   Take 1 capsule (75 mg total) by mouth every 12 (twelve) hours.   10 capsule   0   . predniSONE (DELTASONE) 20 MG tablet   Oral   Take 3 tablets (60 mg total) by mouth daily with breakfast.   15 tablet   0    BP 116/62  Pulse 116  Temp(Src) 99.5 F (37.5 C) (Oral)  Resp 18  SpO2 97%  LMP 08/02/2013 Physical Exam  Constitutional: She is oriented to person, place, and time. She appears well-developed and  well-nourished. No distress.  HENT:  Head: Normocephalic and atraumatic.  Right Ear: Hearing normal.  Left Ear: Hearing normal.  Nose: Nose normal.  Mouth/Throat: Oropharynx is clear and moist and mucous membranes are normal.  Eyes: Conjunctivae and EOM are normal. Pupils are equal, round, and reactive to light.  Neck: Normal range of motion. Neck supple.  Cardiovascular: Regular rhythm, S1 normal and S2 normal.  Exam reveals no gallop and no friction rub.   No murmur heard. Pulmonary/Chest: Effort normal. No respiratory distress. She has wheezes. She exhibits no tenderness.  Abdominal: Soft. Normal appearance and bowel sounds are normal. There is no hepatosplenomegaly. There is no tenderness. There is no rebound, no guarding, no tenderness at McBurney's point and negative Murphy's sign. No hernia.  Musculoskeletal: Normal range of motion.  Neurological: She is alert and oriented to person, place, and time. She has normal strength. No cranial nerve deficit or sensory deficit. Coordination normal. GCS eye subscore is 4. GCS verbal subscore is 5. GCS motor subscore is 6.  Skin: Skin is warm, dry and intact. No rash noted. No cyanosis.  Psychiatric: She has a normal mood and affect. Her speech is normal and behavior is normal. Thought content normal.    ED Course  Procedures (including critical care time) Labs Review Labs Reviewed  I-STAT CHEM 8, ED - Abnormal; Notable for the following:    Sodium 136 (*)    Potassium 3.6 (*)    BUN 4 (*)    Hemoglobin 11.6 (*)    HCT 34.0 (*)    All other components within normal limits   Imaging Review No results found.   EKG Interpretation None      MDM   Final diagnoses:  Bronchospasm  Bronchitis   Patient presented to the ER for evaluation of continued cough, congestion, wheezing and shortness of breath. Patient's wheezing has a nebulizer here in the ER. It was decided that the patient would require steroids. She was given Solu-Medrol.  Oxygen saturations are normal. She has a persistent cough here in the ER.  Patient does have a history of DVT, is currently taking Lovenox prophylactically. Based on her cough, congestion, generalized body aches, I am not suspicious for PE. She did have a borderline low-grade fever here in the ER.  She was treated with bronchodilators and Solu-Medrol. She has  improved. At time of discharge, lungs are clear. Patient treated empirically with Omnicef. I did not feel that there was enough evidence of pneumonia on Sam to have her x-rayed at [redacted] weeks pregnant. She just finished a macrolide, will add a cephalosporin for empiric coverage. Continue prednisone as an outpatient with dilators.  Patient also empirically treated with Tamiflu, as recommended in pregnancy.    Orpah Greek, MD 12/16/13 [id number]

## 2013-12-15 NOTE — Discharge Instructions (Signed)
Asthma, Acute Bronchospasm °Acute bronchospasm caused by asthma is also referred to as an asthma attack. Bronchospasm means your air passages become narrowed. The narrowing is caused by inflammation and tightening of the muscles in the air tubes (bronchi) in your lungs. This can make it hard to breath or cause you to wheeze and cough. °CAUSES °Possible triggers are: °· Animal dander from the skin, hair, or feathers of animals. °· Dust mites contained in house dust. °· Cockroaches. °· Pollen from trees or grass. °· Mold. °· Cigarette or tobacco smoke. °· Air pollutants such as dust, household cleaners, hair sprays, aerosol sprays, paint fumes, strong chemicals, or strong odors. °· Cold air or weather changes. Cold air may trigger inflammation. Winds increase molds and pollens in the air. °· Strong emotions such as crying or laughing hard. °· Stress. °· Certain medicines such as aspirin or beta-blockers. °· Sulfites in foods and drinks, such as dried fruits and wine. °· Infections or inflammatory conditions, such as a flu, cold, or inflammation of the nasal membranes (rhinitis). °· Gastroesophageal reflux disease (GERD). GERD is a condition where stomach acid backs up into your throat (esophagus). °· Exercise or strenuous activity. °SIGNS AND SYMPTOMS  °· Wheezing. °· Excessive coughing, particularly at night. °· Chest tightness. °· Shortness of breath. °DIAGNOSIS  °Your health care provider will ask you about your medical history and perform a physical exam. A chest X-ray or blood testing may be performed to look for other causes of your symptoms or other conditions that may have triggered your asthma attack.  °TREATMENT  °Treatment is aimed at reducing inflammation and opening up the airways in your lungs.  Most asthma attacks are treated with inhaled medicines. These include quick relief or rescue medicines (such as bronchodilators) and controller medicines (such as inhaled corticosteroids). These medicines are  sometimes given through an inhaler or a nebulizer. Systemic steroid medicine taken by mouth or given through an IV tube also can be used to reduce the inflammation when an attack is moderate or severe. Antibiotic medicines are only used if a bacterial infection is present.  °HOME CARE INSTRUCTIONS  °· Rest. °· Drink plenty of liquids. This helps the mucus to remain thin and be easily coughed up. Only use caffeine in moderation and do not use alcohol until you have recovered from your illness. °· Do not smoke. Avoid being exposed to secondhand smoke. °· You play a critical role in keeping yourself in good health. Avoid exposure to things that cause you to wheeze or to have breathing problems. °· Keep your medicines up to date and available. Carefully follow your health care provider's treatment plan. °· Take your medicine exactly as prescribed. °· When pollen or pollution is bad, keep windows closed and use an air conditioner or go to places with air conditioning. °· Asthma requires careful medical care. See your health care provider for a follow-up as advised. If you are more than [redacted] weeks pregnant and you were prescribed any new medicines, let your obstetrician know about the visit and how you are doing. Follow-up with your health care provider as directed. °· After you have recovered from your asthma attack, make an appointment with your outpatient doctor to talk about ways to reduce the likelihood of future attacks. If you do not have a doctor who manages your asthma, make an appointment with a primary care doctor to discuss your asthma. °SEEK IMMEDIATE MEDICAL CARE IF:  °· You are getting worse. °· You have trouble breathing. If severe, call   your local emergency services (911 in the [place] °· You develop chest pain or discomfort. °· You are vomiting. °· You are not able to keep fluids down. °· You are coughing up yellow, green, brown, or bloody sputum. °· You have a fever and your symptoms suddenly get  worse. °· You have trouble swallowing. °MAKE SURE YOU:  °· Understand these instructions. °· Will watch your condition. °· Will get help right away if you are not doing well or get worse. °Document Released: 12/22/2006 Document Revised: 05/09/2013 Document Reviewed: 03/14/2013 °[employer]® Patient Information ©2014 [employer]. ° °

## 2013-12-15 NOTE — MAU Provider Note (Signed)
Chief Complaint: Nausea, Emesis and Back Pain   First Provider Initiated Contact with Patient 12/15/13 1449     SUBJECTIVE HPI: Ashley [name] is a 38 y.o. [id number] at [redacted]w[redacted]d who presents with three-week history of productive cough, generalized aches, nausea and vomiting (which has been ongoing throughout the pregnancy), decreased fetal movement. Felt feverish and had chills last night. No analgesic/antipyretic meds taken today.  She was seen in Urgent Care 3 weeks ago for the cough and malaise and was given a Z-Pak, codeine containing cough suppressant, albuterol inhaler. She states her cough was initially productive of green sputum and became clear after the Z-Pak, but is now productive of green sputum again. She's using albuterol every 4 hours. She has some dyspnea on exertion and mild pleuritic CP. No SOB at present.  She aches all over but mostly throughout her back and substernal area. Denies PMH asthma.  Prenatal course: Primary Dr. Gaetano Net;  Significant for AMA 35 at delivery, obesity; history preterm birth; history DVT, bronchitis 2 months ago treated with Z-Pak and Tessalon Perles.  Past Medical History  Diagnosis Date  . PCOS (polycystic ovarian syndrome)   . Endometrial polyp   . Infertility   . DVT (deep venous thrombosis) 08/26/2010    LEFT LEG  . Hypertension     BORDERLINE  . Miscarriage 03/2011  . Depression   . Back pain   . DVT (deep venous thrombosis) 2012    left leg   OB History  Gravida Para Term Preterm AB SAB TAB Ectopic Multiple Living  3    1 1     0    # Outcome Date GA Lbr Len/2nd Weight Sex Delivery Anes PTL Lv  3 CUR           2 SAB           1 GRA              Past Surgical History  Procedure Laterality Date  . Knee surgery      X4-5  . Ankle surgery      X2  . 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 Hysteroscopy  2011    HYSTEROSCOPY, D&C  . Dilation and evacuation  04/02/2011    Procedure: DILATATION AND EVACUATION (D&E);   Surgeon: Luz Lex, MD;  Location: [facility];  Service: Gynecology;  Laterality: N/A;  dvt left mid thigh  . Dilation and curettage of uterus      poloyps   History   Social History  . Marital Status: Legally Separated    Spouse Name: N/A    Number of Children: N/A  . Years of Education: N/A   Occupational History  . Not on file.   Social History Main Topics  . Smoking status: Never Smoker   . Smokeless tobacco: Not on file  . Alcohol Use: No  . Drug Use: No  . Sexual Activity: Yes    Birth Control/ Protection: None   Other Topics Concern  . Not on file   Social History Narrative  . No narrative on file   No current facility-administered medications on file prior to encounter.   Current Outpatient Prescriptions on File Prior to Encounter  Medication Sig Dispense Refill  . Prenatal Vit-Fe Fumarate-FA (PRENATAL MULTIVITAMIN) TABS tablet Take 1 tablet by mouth daily at 12 noon.      . promethazine (PHENERGAN) 25 MG tablet Take 1 tablet (25 mg total) by mouth every 6 (six)  hours as needed for nausea or vomiting.  30 tablet  1  On Lovenox 40mg  qd  Allergies  Allergen Reactions  . Codeine Itching and Nausea Only  . Cymbalta [Duloxetine Hcl] Other (See Comments)    unknown    ROS: Pertinent items in HPI  OBJECTIVE Blood pressure 120/65, pulse 126, temperature 99 F (37.2 C), temperature source Oral, resp. rate 22, height 5\' 11"  (1.803 m), weight 140.161 kg (309 lb), last menstrual period 08/02/2013, SpO2 98.00%. GENERAL: Obese female in some distress with frequent loose cough HEENT: throat clear; congestion present HEART: normal rate RESP: RR 30. Scattered rhonchi, diminished BS both lower lobes ABDOMEN: Soft, non-tender, S=D, DT 155 EXTREMITIES: Nontender, no edema NEURO: Alert and oriented   LAB RESULTS Results for orders placed during the hospital encounter of 12/15/13 (from the past 24 hour(s))  URINALYSIS, ROUTINE W REFLEX MICROSCOPIC     Status: Abnormal    Collection Time    12/15/13  2:25 PM      Result Value Ref Range   Color, Urine YELLOW  YELLOW   APPearance HAZY (*) CLEAR   Specific Gravity, Urine 1.020  1.005 - 1.030   pH 8.0  5.0 - 8.0   Glucose, UA NEGATIVE  NEGATIVE mg/dL   Hgb urine dipstick NEGATIVE  NEGATIVE   Bilirubin Urine NEGATIVE  NEGATIVE   Ketones, ur NEGATIVE  NEGATIVE mg/dL   Protein, ur NEGATIVE  NEGATIVE mg/dL   Urobilinogen, UA 1.0  0.0 - 1.0 mg/dL   Nitrite NEGATIVE  NEGATIVE   Leukocytes, UA NEGATIVE  NEGATIVE  CBC WITH DIFFERENTIAL     Status: Abnormal   Collection Time    12/15/13  3:19 PM      Result Value Ref Range   WBC 7.0  4.0 - 10.5 K/uL   RBC 4.24  3.87 - 5.11 MIL/uL   Hemoglobin 11.3 (*) 12.0 - 15.0 g/dL   HCT 34.0 (*) 36.0 - 46.0 %   MCV 80.2  78.0 - 100.0 fL   MCH 26.7  26.0 - 34.0 pg   MCHC 33.2  30.0 - 36.0 g/dL   RDW 14.4  11.5 - 15.5 %   Platelets 155  150 - 400 K/uL   Neutrophils Relative % 71  43 - 77 %   Neutro Abs 5.0  1.7 - 7.7 K/uL   Lymphocytes Relative 17  12 - 46 %   Lymphs Abs 1.2  0.7 - 4.0 K/uL   Monocytes Relative 9  3 - 12 %   Monocytes Absolute 0.7  0.1 - 1.0 K/uL   Eosinophils Relative 2  0 - 5 %   Eosinophils Absolute 0.2  0.0 - 0.7 K/uL   Basophils Relative 0  0 - 1 %   Basophils Absolute 0.0  0.0 - 0.1 K/uL   Smear Review MORPHOLOGY UNREMARKABLE      IMAGING No results found.  MAU COURSE Influenza by PCR nasal swab  sent Albuterol nebs per RT: some relief; scattered wheeezes C/W Dr. Matthew Saras [facility] for further evaluation, r/o pneumonia, adjust asthma med regimen. Pt declines [facility] and agrees to go directly to [facility]  TC Dr. Tomi Bamberger at [facility] accepts transfer  ASSESSMENT 1. Acute asthmatic bronchitis   [id number] at [redacted]w[redacted]d  PLAN Discharge to go directly to [facility].    Medication List         albuterol 108 (90 BASE) MCG/ACT inhaler  Commonly known as:  PROVENTIL HFA;VENTOLIN HFA  Inhale 1-2 puffs into  the lungs every 6 (six) hours as needed for  wheezing or shortness of breath.     azithromycin 250 MG tablet  Commonly known as:  ZITHROMAX  Take 250 mg by mouth daily.     enoxaparin 40 MG/0.4ML injection  Commonly known as:  LOVENOX  Inject 40 mg into the skin daily.     prenatal multivitamin Tabs tablet  Take 1 tablet by mouth daily at 12 noon.     promethazine 25 MG tablet  Commonly known as:  PHENERGAN  Take 1 tablet (25 mg total) by mouth every 6 (six) hours as needed for nausea or vomiting.     promethazine-codeine 6.25-10 MG/5ML syrup  Commonly known as:  PHENERGAN with CODEINE  Take 5 mLs by mouth every 6 (six) hours as needed for cough.         Lorene Dy, CNM 12/15/2013  3:32 PM

## 2013-12-15 NOTE — ED Notes (Signed)
Pt presents with c/o cough that started three weeks ago. Pt says she was diagnosed with bronchitis three weeks ago and ever since then the cough has not gotten any better. Pt says she went to [facility] hospital today, pt is [redacted] weeks pregnant[facility] giving her [facility] tx.

## 2013-12-15 NOTE — Discharge Instructions (Signed)
Bronchitis Bronchitis is inflammation of the airways that extend from the windpipe into the lungs (bronchi). The inflammation often causes mucus to develop, which leads to a cough. If the inflammation becomes severe, it may cause shortness of breath. CAUSES  Bronchitis may be caused by:   Viral infections.   Bacteria.   Cigarette smoke.   Allergens, pollutants, and other irritants.  SIGNS AND SYMPTOMS  The most common symptom of bronchitis is a frequent cough that produces mucus. Other symptoms include:  Fever.   Body aches.   Chest congestion.   Chills.   Shortness of breath.   Sore throat.  DIAGNOSIS  Bronchitis is usually diagnosed through a medical history and physical exam. Tests, such as chest X-rays, are sometimes done to rule out other conditions.  TREATMENT  You may need to avoid contact with whatever caused the problem (smoking, for example). Medicines are sometimes needed. These may include:  Antibiotics. These may be prescribed if the condition is caused by bacteria.  Cough suppressants. These may be prescribed for relief of cough symptoms.   Inhaled medicines. These may be prescribed to help open your airways and make it easier for you to breathe.   Steroid medicines. These may be prescribed for those with recurrent (chronic) bronchitis. HOME CARE INSTRUCTIONS  Get plenty of rest.   Drink enough fluids to keep your urine clear or pale yellow (unless you have a medical condition that requires fluid restriction). Increasing fluids may help thin your secretions and will prevent dehydration.   Only take over-the-counter or prescription medicines as directed by your health care provider.  Only take antibiotics as directed. Make sure you finish them even if you start to feel better.  Avoid secondhand smoke, irritating chemicals, and strong fumes. These will make bronchitis worse. If you are a smoker, quit smoking. Consider using nicotine gum or  skin patches to help control withdrawal symptoms. Quitting smoking will help your lungs heal faster.   Put a cool-mist humidifier in your bedroom at night to moisten the air. This may help loosen mucus. Change the water in the humidifier daily. You can also run the hot water in your shower and sit in the bathroom with the door closed for 5 10 minutes.   Follow up with your health care provider as directed.   Wash your hands frequently to avoid catching bronchitis again or spreading an infection to others.  SEEK MEDICAL CARE IF: Your symptoms do not improve after 1 week of treatment.  SEEK IMMEDIATE MEDICAL CARE IF:  Your fever increases.  You have chills.   You have chest pain.   You have worsening shortness of breath.   You have bloody sputum.  You faint.  You have lightheadedness.  You have a severe headache.   You vomit repeatedly. MAKE SURE YOU:   Understand these instructions.  Will watch your condition.  Will get help right away if you are not doing well or get worse. Document Released: 09/06/2005 Document Revised: 06/27/2013 Document Reviewed: 05/01/2013 [facility] Patient Information 2014 [place].  Bronchospasm, Adult A bronchospasm is a spasm or tightening of the airways going into the lungs. During a bronchospasm breathing becomes more difficult because the airways get smaller. When this happens there can be coughing, a whistling sound when breathing (wheezing), and difficulty breathing. Bronchospasm is often associated with asthma, but not all patients who experience a bronchospasm have asthma. CAUSES  A bronchospasm is caused by inflammation or irritation of the airways. The inflammation or  may be triggered by:   Allergies (such as to animals, pollen, food, or mold). Allergens that cause bronchospasm may cause wheezing immediately after exposure or many hours later.   Infection. Viral infections are believed to be the most common  cause of bronchospasm.   Exercise.   Irritants (such as pollution, cigarette smoke, strong odors, aerosol sprays, and paint fumes).   Weather changes. Winds increase molds and pollens in the air. Rain refreshes the air by washing irritants out. Cold air may cause inflammation.   Stress and emotional upset.  SIGNS AND SYMPTOMS   Wheezing.   Excessive nighttime coughing.   Frequent or severe coughing with a simple cold.   Chest tightness.   Shortness of breath.  DIAGNOSIS  Bronchospasm is usually diagnosed through a history and physical exam. Tests, such as chest X-rays, are sometimes done to look for other conditions. TREATMENT   Inhaled medicines can be given to open up your airways and help you breathe. The medicines can be given using either an inhaler or a nebulizer machine.  Corticosteroid medicines may be given for severe bronchospasm, usually when it is associated with asthma. HOME CARE INSTRUCTIONS   Always have a plan prepared for seeking medical care. Know when to call your health care provider and local emergency services (911 in the [place] Know where you can access local emergency care.  Only take medicines as directed by your health care provider.  If you were prescribed an inhaler or nebulizer machine, ask your health care provider to explain how to use it correctly. Always use a spacer with your inhaler if you were given one.  It is necessary to remain calm during an attack. Try to relax and breathe more slowly.  Control your home environment in the following ways:   Change your heating and air conditioning filter at least once a month.   Limit your use of fireplaces and wood stoves.  Do not smoke and do not allow smoking in your home.   Avoid exposure to perfumes and fragrances.   Get rid of pests (such as roaches and mice) and their droppings.   Throw away plants if you see mold on them.   Keep your house clean and dust free.    Replace carpet with wood, tile, or vinyl flooring. Carpet can trap dander and dust.   Use allergy-proof pillows, mattress covers, and box spring covers.   Wash bed sheets and blankets every week in hot water and dry them in a dryer.   Use blankets that are made of polyester or cotton.   Wash hands frequently. SEEK MEDICAL CARE IF:   You have muscle aches.   You have chest pain.   The sputum changes from clear or white to yellow, green, gray, or bloody.   The sputum you cough up gets thicker.   There are problems that may be related to the medicine you are given, such as a rash, itching, swelling, or trouble breathing.  SEEK IMMEDIATE MEDICAL CARE IF:   You have worsening wheezing and coughing even after taking your prescribed medicines.   You have increased difficulty breathing.   You develop severe chest pain. MAKE SURE YOU:   Understand these instructions.  Will watch your condition.  Will get help right away if you are not doing well or get worse. Document Released: 09/09/2003 Document Revised: 05/09/2013 Document Reviewed: 02/26/2013 [employer] Patient Information 2014 [employer].  

## 2013-12-15 NOTE — ED Notes (Signed)
Pt reports stuffiness and cough, generalized body aches, and chills.

## 2014-02-26 ENCOUNTER — Ambulatory Visit: Payer: BC Managed Care – PPO | Attending: Obstetrics and Gynecology | Admitting: Physical Therapy

## 2014-02-26 DIAGNOSIS — IMO0001 Reserved for inherently not codable concepts without codable children: Secondary | ICD-10-CM | POA: Insufficient documentation

## 2014-02-26 DIAGNOSIS — M545 Low back pain, unspecified: Secondary | ICD-10-CM | POA: Insufficient documentation

## 2014-02-26 DIAGNOSIS — R5381 Other malaise: Secondary | ICD-10-CM | POA: Diagnosis not present

## 2014-02-26 DIAGNOSIS — M25559 Pain in unspecified hip: Secondary | ICD-10-CM | POA: Insufficient documentation

## 2014-03-04 ENCOUNTER — Ambulatory Visit: Payer: BC Managed Care – PPO | Admitting: Physical Therapy

## 2014-03-04 ENCOUNTER — Other Ambulatory Visit (HOSPITAL_COMMUNITY): Payer: Self-pay | Admitting: Obstetrics and Gynecology

## 2014-03-04 DIAGNOSIS — O9989 Other specified diseases and conditions complicating pregnancy, childbirth and the puerperium: Secondary | ICD-10-CM

## 2014-03-04 DIAGNOSIS — O09529 Supervision of elderly multigravida, unspecified trimester: Secondary | ICD-10-CM

## 2014-03-04 DIAGNOSIS — O99891 Other specified diseases and conditions complicating pregnancy: Secondary | ICD-10-CM

## 2014-03-04 DIAGNOSIS — IMO0001 Reserved for inherently not codable concepts without codable children: Secondary | ICD-10-CM | POA: Diagnosis not present

## 2014-03-07 ENCOUNTER — Other Ambulatory Visit (HOSPITAL_COMMUNITY): Payer: Self-pay | Admitting: Obstetrics and Gynecology

## 2014-03-07 ENCOUNTER — Ambulatory Visit (HOSPITAL_COMMUNITY)
Admission: RE | Admit: 2014-03-07 | Discharge: 2014-03-07 | Disposition: A | Discharge status: Home / Self Care | Payer: BC Managed Care – PPO | Source: Ambulatory Visit | Attending: Obstetrics and Gynecology | Admitting: Obstetrics and Gynecology

## 2014-03-07 DIAGNOSIS — O212 Late vomiting of pregnancy: Secondary | ICD-10-CM | POA: Diagnosis not present

## 2014-03-07 DIAGNOSIS — O26899 Other specified pregnancy related conditions, unspecified trimester: Secondary | ICD-10-CM

## 2014-03-07 DIAGNOSIS — O99891 Other specified diseases and conditions complicating pregnancy: Secondary | ICD-10-CM | POA: Diagnosis not present

## 2014-03-07 DIAGNOSIS — R109 Unspecified abdominal pain: Secondary | ICD-10-CM

## 2014-03-07 DIAGNOSIS — N949 Unspecified condition associated with female genital organs and menstrual cycle: Secondary | ICD-10-CM | POA: Diagnosis present

## 2014-03-07 DIAGNOSIS — G8929 Other chronic pain: Secondary | ICD-10-CM | POA: Insufficient documentation

## 2014-03-07 DIAGNOSIS — R262 Difficulty in walking, not elsewhere classified: Secondary | ICD-10-CM | POA: Diagnosis not present

## 2014-03-07 DIAGNOSIS — Z86718 Personal history of other venous thrombosis and embolism: Secondary | ICD-10-CM | POA: Insufficient documentation

## 2014-03-07 DIAGNOSIS — O9989 Other specified diseases and conditions complicating pregnancy, childbirth and the puerperium: Principal | ICD-10-CM

## 2014-03-07 DIAGNOSIS — O09529 Supervision of elderly multigravida, unspecified trimester: Secondary | ICD-10-CM

## 2014-03-07 DIAGNOSIS — M25559 Pain in unspecified hip: Secondary | ICD-10-CM | POA: Diagnosis not present

## 2014-03-07 NOTE — Consult Note (Signed)
Maternal Fetal Medicine Consultation  Requesting Provider(s): Everlene Farrier, MD  Reason for consultation: Pelvic pain, pressure  HPI: Ashley [name] is a 38 year-old [id number] currently at 31w 4d who was seen for consultation due to chronic pelvic / hip pain that makes it difficult to ambulate.  The patient has a history of multiple orthopedic procedures and chronic lower back pain (bulging discs).  She was seen by physical therapy on Monday and has follow up scheduled.  She reports that she is unable to sleep, has continued nausea and vomiting, and having difficulty ambulating.  She takes Percocet 5/325 prn for severe pain.  The patient reports that she was followed in a Pain clinic prior to pregnancy, but is currently off all other narcotic medications. Ashley [name] was previously seen due to a history of DVT.  She is currently on Lovenox 40 mg daily but reports that she is not taking it very frequently.  She is also on weekly 17-P injections due to a history of a prior 34 week delivery / PROM.  She is otherwise without complaints.  The fetus is active.  OB History: OB History   Grav Para Term Preterm Abortions TAB SAB Ect Mult Living   3    1  1    0      PMH:  Past Medical History  Diagnosis Date  . PCOS (polycystic ovarian syndrome)   . Endometrial polyp   . Infertility   . DVT (deep venous thrombosis) 08/26/2010    LEFT LEG  . Hypertension     BORDERLINE  . Miscarriage 03/2011  . Depression   . Back pain   . DVT (deep venous thrombosis) 2012    left leg    PSH:  Past Surgical History  Procedure Laterality Date  . Knee surgery      X4-5  . Ankle surgery      X2  . 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 Hysteroscopy  2011    HYSTEROSCOPY, D&C  . Dilation and evacuation  04/02/2011    Procedure: DILATATION AND EVACUATION (D&E);  Surgeon: Luz Lex, MD;  Location: [facility];  Service: Gynecology;  Laterality: N/A;  dvt left mid thigh  . Dilation and  curettage of uterus      poloyps   Meds:  Current Outpatient Prescriptions on File Prior to Encounter  Medication Sig Dispense Refill  . albuterol (PROVENTIL HFA;VENTOLIN HFA) 108 (90 BASE) MCG/ACT inhaler Inhale 1-2 puffs into the lungs every 6 (six) hours as needed for wheezing or shortness of breath.      . cefdinir (OMNICEF) 300 MG capsule Take 1 capsule (300 mg total) by mouth 2 (two) times daily.  20 capsule  0  . enoxaparin (LOVENOX) 40 MG/0.4ML injection Inject 40 mg into the skin daily.      Marland Kitchen oseltamivir (TAMIFLU) 75 MG capsule Take 1 capsule (75 mg total) by mouth every 12 (twelve) hours.  10 capsule  0  . predniSONE (DELTASONE) 20 MG tablet Take 3 tablets (60 mg total) by mouth daily with breakfast.  15 tablet  0  . Prenatal Vit-Fe Fumarate-FA (PRENATAL MULTIVITAMIN) TABS tablet Take 1 tablet by mouth every morning.       . promethazine (PHENERGAN) 25 MG tablet Take 1 tablet (25 mg total) by mouth every 6 (six) hours as needed for nausea or vomiting.  30 tablet  1  . promethazine-codeine (PHENERGAN WITH CODEINE) 6.25-10 MG/5ML syrup Take 5 mLs by  mouth every 6 (six) hours as needed for cough.       No current facility-administered medications on file prior to encounter.   Allergies:  Allergies  Allergen Reactions  . Cymbalta [Duloxetine Hcl] Other (See Comments)    Altered mental status   FH: Soc:  History   Social History  . Marital Status: Legally Separated    Spouse Name: N/A    Number of Children: N/A  . Years of Education: N/A   Occupational History  . Not on file.   Social History Main Topics  . Smoking status: Never Smoker   . Smokeless tobacco: Not on file  . Alcohol Use: No  . Drug Use: No  . Sexual Activity: Yes    Birth Control/ Protection: None   Other Topics Concern  . Not on file   Social History Narrative  . No narrative on file    Review of Systems: no vaginal bleeding or cramping/contractions, no LOF, no nausea/vomiting. All other  systems reviewed and are negative.  PNL:   PE:  106/67, 104, 311#  GEN: well-appearing female ABD: gravid, NT  Please see separate document for fetal ultrasound report.  A/P: 1) Single IUP at [redacted]w[redacted]d         2) Hx of DVT on Lovenox - thrombophilia work up was negative.  The Protein S activity was suppressed (29%) which is most likely due to pregnancy and not Protein S deficiency.  Recommend that this be rechecked post partum.  If it continues to be suppressed, would recommend Hematology consultation. The patient was again encouraged to take her Lovenox as prescribed.         3) Pelvic/ hip pain - the patient has a history of multiple orthopedic procedures and chronic pain.  Concur with physical therapy referral.  She may benefit from further work up with Pain service (i.e. Possibly addition of SSRI or other antidepressant medication) but explained that this is unlikely to happen prior to delivery.  We had a long discussion regarding our therapeutic limitations due to pregnancy - would recommend further evaluation and treatment as appropriate after delivery.  The patient reports inability to sleep - a prescription for Ambien 5 mg (#10) was given.  If beneficial, may consider additional medication at follow up visit with primary obstetrician.   Thank you for the opportunity to be a part of the care of Ashley [name]. Please contact our office if we can be of further assistance.   I spent approximately 15 minutes with this patient with over 50% of time spent in face-to-face counseling.  Benjaman Lobe, MD Maternal-Fetal Medicine

## 2014-03-08 ENCOUNTER — Ambulatory Visit: Payer: BC Managed Care – PPO | Admitting: Physical Therapy

## 2014-03-12 ENCOUNTER — Ambulatory Visit: Payer: BC Managed Care – PPO | Admitting: Physical Therapy

## 2014-03-12 DIAGNOSIS — IMO0001 Reserved for inherently not codable concepts without codable children: Secondary | ICD-10-CM | POA: Diagnosis not present

## 2014-03-15 ENCOUNTER — Encounter: Payer: BC Managed Care – PPO | Admitting: Physical Therapy

## 2014-03-18 ENCOUNTER — Ambulatory Visit: Payer: BC Managed Care – PPO | Admitting: Physical Therapy

## 2014-03-18 DIAGNOSIS — IMO0001 Reserved for inherently not codable concepts without codable children: Secondary | ICD-10-CM | POA: Diagnosis not present

## 2014-03-26 ENCOUNTER — Ambulatory Visit: Payer: BC Managed Care – PPO | Admitting: Physical Therapy

## 2014-03-27 ENCOUNTER — Ambulatory Visit: Payer: BC Managed Care – PPO | Attending: Obstetrics and Gynecology | Admitting: Physical Therapy

## 2014-03-27 DIAGNOSIS — M545 Low back pain, unspecified: Secondary | ICD-10-CM | POA: Insufficient documentation

## 2014-03-27 DIAGNOSIS — M25559 Pain in unspecified hip: Secondary | ICD-10-CM | POA: Insufficient documentation

## 2014-03-27 DIAGNOSIS — R5381 Other malaise: Secondary | ICD-10-CM | POA: Insufficient documentation

## 2014-03-27 DIAGNOSIS — IMO0001 Reserved for inherently not codable concepts without codable children: Secondary | ICD-10-CM | POA: Insufficient documentation

## 2014-04-02 ENCOUNTER — Encounter: Payer: BC Managed Care – PPO | Admitting: Physical Therapy

## 2014-04-02 LAB — OB RESULTS CONSOLE GBS: GBS: NEGATIVE

## 2014-04-11 ENCOUNTER — Encounter (HOSPITAL_COMMUNITY): Payer: Self-pay | Admitting: *Deleted

## 2014-04-11 ENCOUNTER — Telehealth (HOSPITAL_COMMUNITY): Payer: Self-pay | Admitting: *Deleted

## 2014-04-11 NOTE — Telephone Encounter (Signed)
Preadmission screen  

## 2014-04-16 ENCOUNTER — Encounter (HOSPITAL_COMMUNITY): Payer: Self-pay | Admitting: Anesthesiology

## 2014-04-16 NOTE — Anesthesia Preprocedure Evaluation (Addendum)
Anesthesia Evaluation  Patient identified by MRN, date of birth, ID band Patient awake    Reviewed: Allergy & Precautions, H&P , Patient's Chart, lab work & pertinent test results  Airway Mallampati: III TM Distance: >3 FB Neck ROM: Full    Dental no notable dental hx. (+) Teeth Intact   Pulmonary neg pulmonary ROS,  breath sounds clear to auscultation  Pulmonary exam normal       Cardiovascular hypertension, Rhythm:Regular Rate:Normal  Hx/o DVT on unfractionated Heparin 5,000 u Q12H - last dose 8pm   Neuro/Psych  Headaches, PSYCHIATRIC DISORDERS Depression    GI/Hepatic Neg liver ROS, GERD-  Medicated and Controlled,  Endo/Other  Morbid obesityPCOS  Renal/GU negative Renal ROS  negative genitourinary   Musculoskeletal negative musculoskeletal ROS (+) AMA   Abdominal Normal abdominal exam  (+) + obese,   Peds  Hematology  (+) anemia ,   Anesthesia Other Findings   Reproductive/Obstetrics (+) Pregnancy                        Anesthesia Physical Anesthesia Plan  ASA: III  Anesthesia Plan: Epidural   Post-op Pain Management:    Induction:   Airway Management Planned: Natural Airway  Additional Equipment:   Intra-op Plan:   Post-operative Plan:   Informed Consent: I have reviewed the patients History and Physical, chart, labs and discussed the procedure including the risks, benefits and alternatives for the proposed anesthesia with the patient or authorized representative who has indicated his/her understanding and acceptance.     Plan Discussed with: Anesthesiologist  Anesthesia Plan Comments:         Anesthesia Quick Evaluation  History of bulging discs (patient unaware of which levels being treated with epidural steroid injections).  Switching from lovenox to heparin.  Discussed timing of epidural.  We will check PTT prior to placement.  Her questions were answered.

## 2014-04-20 ENCOUNTER — Encounter (HOSPITAL_COMMUNITY): Payer: Self-pay | Admitting: *Deleted

## 2014-04-20 ENCOUNTER — Inpatient Hospital Stay (HOSPITAL_COMMUNITY)
Admission: AD | Admit: 2014-04-20 | Discharge: 2014-04-20 | Disposition: A | Discharge status: Home / Self Care | Payer: BC Managed Care – PPO | Source: Ambulatory Visit | Attending: Obstetrics and Gynecology | Admitting: Obstetrics and Gynecology

## 2014-04-20 DIAGNOSIS — M549 Dorsalgia, unspecified: Secondary | ICD-10-CM | POA: Diagnosis not present

## 2014-04-20 DIAGNOSIS — O479 False labor, unspecified: Secondary | ICD-10-CM | POA: Insufficient documentation

## 2014-04-20 LAB — URINALYSIS, ROUTINE W REFLEX MICROSCOPIC
Bilirubin Urine: NEGATIVE
Glucose, UA: NEGATIVE mg/dL
Hgb urine dipstick: NEGATIVE
Ketones, ur: NEGATIVE mg/dL
Leukocytes, UA: NEGATIVE
Nitrite: NEGATIVE
Protein, ur: NEGATIVE mg/dL
Specific Gravity, Urine: 1.015 (ref 1.005–1.030)
Urobilinogen, UA: 2 mg/dL — ABNORMAL HIGH (ref 0.0–1.0)
pH: 7 (ref 5.0–8.0)

## 2014-04-20 NOTE — Discharge Instructions (Signed)
Keep your scheduled appointment for prenatal care. Drink 8-10 glasses of water per day. Call the office or nurse on call with further concerns.

## 2014-04-20 NOTE — MAU Note (Signed)
States she does not feel contractions, just pressure. States she has a lot of back pain.

## 2014-04-20 NOTE — MAU Note (Signed)
Pt states here for u/c's. Denies bleeding or gush of fluid. Did lose mucus plug yesterday.

## 2014-04-22 ENCOUNTER — Inpatient Hospital Stay (HOSPITAL_COMMUNITY): Admission: RE | Admit: 2014-04-22 | Payer: BC Managed Care – PPO | Source: Ambulatory Visit

## 2014-04-22 ENCOUNTER — Encounter (HOSPITAL_COMMUNITY): Payer: Self-pay | Admitting: *Deleted

## 2014-04-22 ENCOUNTER — Inpatient Hospital Stay (HOSPITAL_COMMUNITY): Payer: BC Managed Care – PPO

## 2014-04-22 ENCOUNTER — Inpatient Hospital Stay (HOSPITAL_COMMUNITY)
Admission: AD | Admit: 2014-04-22 | Discharge: 2014-04-22 | Disposition: A | Discharge status: Home / Self Care | Payer: BC Managed Care – PPO | Source: Ambulatory Visit | Attending: Obstetrics & Gynecology | Admitting: Obstetrics & Gynecology

## 2014-04-22 DIAGNOSIS — O09529 Supervision of elderly multigravida, unspecified trimester: Secondary | ICD-10-CM | POA: Diagnosis not present

## 2014-04-22 DIAGNOSIS — Z86718 Personal history of other venous thrombosis and embolism: Secondary | ICD-10-CM

## 2014-04-22 DIAGNOSIS — Z3689 Encounter for other specified antenatal screening: Secondary | ICD-10-CM

## 2014-04-22 DIAGNOSIS — A6000 Herpesviral infection of urogenital system, unspecified: Secondary | ICD-10-CM | POA: Diagnosis not present

## 2014-04-22 DIAGNOSIS — Z36 Encounter for antenatal screening of mother: Secondary | ICD-10-CM

## 2014-04-22 DIAGNOSIS — O98519 Other viral diseases complicating pregnancy, unspecified trimester: Secondary | ICD-10-CM | POA: Diagnosis not present

## 2014-04-22 DIAGNOSIS — O10019 Pre-existing essential hypertension complicating pregnancy, unspecified trimester: Secondary | ICD-10-CM | POA: Insufficient documentation

## 2014-04-22 DIAGNOSIS — O368190 Decreased fetal movements, unspecified trimester, not applicable or unspecified: Secondary | ICD-10-CM | POA: Diagnosis present

## 2014-04-22 DIAGNOSIS — Z3493 Encounter for supervision of normal pregnancy, unspecified, third trimester: Secondary | ICD-10-CM

## 2014-04-22 LAB — COMPREHENSIVE METABOLIC PANEL
ALT: 10 U/L (ref 0–35)
AST: 20 U/L (ref 0–37)
Albumin: 2.7 g/dL — ABNORMAL LOW (ref 3.5–5.2)
Alkaline Phosphatase: 232 U/L — ABNORMAL HIGH (ref 39–117)
Anion gap: 10 (ref 5–15)
BUN: 8 mg/dL (ref 6–23)
CO2: 21 mEq/L (ref 19–32)
Calcium: 8.9 mg/dL (ref 8.4–10.5)
Chloride: 105 mEq/L (ref 96–112)
Creatinine, Ser: 0.69 mg/dL (ref 0.50–1.10)
GFR calc Af Amer: >90 mL/min (ref >90)
GFR calc non Af Amer: >90 mL/min (ref >90)
Glucose, Bld: 74 mg/dL (ref 70–99)
Potassium: 4.2 mEq/L (ref 3.7–5.3)
Sodium: 136 mEq/L — ABNORMAL LOW (ref 137–147)
Total Bilirubin: 0.3 mg/dL (ref 0.3–1.2)
Total Protein: 6.8 g/dL (ref 6.0–8.3)

## 2014-04-22 LAB — CBC WITH DIFFERENTIAL/PLATELET
Basophils Absolute: 0 10*3/uL (ref 0.0–0.1)
Basophils Relative: 0 % (ref 0–1)
Eosinophils Absolute: 0.2 10*3/uL (ref 0.0–0.7)
Eosinophils Relative: 2 % (ref 0–5)
HCT: 32.5 % — ABNORMAL LOW (ref 36.0–46.0)
Hemoglobin: 10.5 g/dL — ABNORMAL LOW (ref 12.0–15.0)
Lymphocytes Relative: 19 % (ref 12–46)
Lymphs Abs: 1.7 10*3/uL (ref 0.7–4.0)
MCH: 24.9 pg — ABNORMAL LOW (ref 26.0–34.0)
MCHC: 32.3 g/dL (ref 30.0–36.0)
MCV: 77.2 fL — ABNORMAL LOW (ref 78.0–100.0)
Monocytes Absolute: 1 10*3/uL (ref 0.1–1.0)
Monocytes Relative: 11 % (ref 3–12)
Neutro Abs: 6.2 10*3/uL (ref 1.7–7.7)
Neutrophils Relative %: 68 % (ref 43–77)
Platelets: 149 10*3/uL — ABNORMAL LOW (ref 150–400)
RBC: 4.21 MIL/uL (ref 3.87–5.11)
RDW: 15.7 % — ABNORMAL HIGH (ref 11.5–15.5)
WBC: 9.1 10*3/uL (ref 4.0–10.5)

## 2014-04-22 MED ORDER — ONDANSETRON 8 MG PO TBDP
8.0000 mg | ORAL_TABLET | Freq: Once | ORAL | Status: AC
  Administered 2014-04-22: 8 mg via ORAL
  Filled 2014-04-22: qty 1

## 2014-04-22 NOTE — Discharge Instructions (Signed)
Third Trimester of Pregnancy The third trimester is from week 29 through week 42, months 7 through 9. This trimester is when your unborn baby (fetus) is growing very fast. At the end of the ninth month, the unborn baby is about 20 inches in length. It weighs about 6-10 pounds.  HOME CARE   Avoid all smoking, herbs, and alcohol. Avoid drugs not approved by your doctor.  Only take medicine as told by your doctor. Some medicines are safe and some are not during pregnancy.  Exercise only as told by your doctor. Stop exercising if you start having cramps.  Eat regular, healthy meals.  Wear a good support bra if your breasts are tender.  Do not use hot tubs, steam rooms, or saunas.  Wear your seat belt when driving.  Avoid raw meat, uncooked cheese, and liter boxes and soil used by cats.  Take your prenatal vitamins.  Try taking medicine that helps you poop (stool softener) as needed, and if your doctor approves. Eat more fiber by eating fresh fruit, vegetables, and whole grains. Drink enough fluids to keep your pee (urine) clear or pale yellow.  Take warm water baths (sitz baths) to soothe pain or discomfort caused by hemorrhoids. Use hemorrhoid cream if your doctor approves.  If you have puffy, bulging veins (varicose veins), wear support hose. Raise (elevate) your feet for 15 minutes, 3-4 times a day. Limit salt in your diet.  Avoid heavy lifting, wear low heels, and sit up straight.  Rest with your legs raised if you have leg cramps or low back pain.  Visit your dentist if you have not gone during your pregnancy. Use a soft toothbrush to brush your teeth. Be gentle when you floss.  You can have sex (intercourse) unless your doctor tells you not to.  Do not travel far distances unless you must. Only do so with your doctor's approval.  Take prenatal classes.  Practice driving to the hospital.  Pack your hospital bag.  Prepare the baby's room.  Go to your doctor visits. GET  HELP IF:  You are not sure if you are in labor or if your water has broken.  You are dizzy.  You have mild cramps or pressure in your lower belly (abdominal).  You have a nagging pain in your belly area.  You continue to feel sick to your stomach (nauseous), throw up (vomit), or have watery poop (diarrhea).  You have bad smelling fluid coming from your vagina.  You have pain with peeing (urination). GET HELP RIGHT AWAY IF:   You have a fever.  You are leaking fluid from your vagina.  You are spotting or bleeding from your vagina.  You have severe belly cramping or pain.  You lose or gain weight rapidly.  You have trouble catching your breath and have chest pain.  You notice sudden or extreme puffiness (swelling) of your face, hands, ankles, feet, or legs.  You have not felt the baby move in over an hour.  You have severe headaches that do not go away with medicine.  You have vision changes. Document Released: 12/01/2009 Document Revised: 01/01/2013 Document Reviewed: 11/07/2012 [employer] Patient Information 2015 [employer]. This information is not intended to replace advice given to you by your health care provider. Make sure you discuss any questions you have with your health care provider.  

## 2014-04-22 NOTE — MAU Note (Signed)
Has had numbness on the left side off and on.

## 2014-04-22 NOTE — MAU Note (Signed)
Urine in lab 

## 2014-04-22 NOTE — MAU Provider Note (Signed)
History     CSN: [id number]  Arrival date and time: 04/22/14 1128   None     Chief Complaint  Patient presents with  . Non-stress Test   HPI Ashley [name] is 38 y.o. G9E0100 [redacted]w[redacted]d weeks presents for further evaluation after being seen in the office and had a Non reactive NST.   Cervical exam by Dr. Gaetano Net this am was reports as "the same 3-4 cm". 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  She reports left leg, buttocks and hand is numb.  Painful lower back and pelvis requiring physical therapy.  Anemia treated with iron in addition to vitamin.  Patient is stable "just ready for baby to come".      Past Medical History  Diagnosis Date  . PCOS (polycystic ovarian syndrome)   . Endometrial polyp   . Infertility   . Hypertension     BORDERLINE  . Miscarriage 03/2011  . Depression   . Back pain   . DVT (deep venous thrombosis) 2012    left leg  . AMA (advanced maternal age) multigravida 61+   . HSV (herpes simplex virus) anogenital infection   . Headache(784.0)   . Chronic pain     Past Surgical History  Procedure Laterality Date  . Knee surgery      X4-5  . Ankle surgery      X2  . 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 Hysteroscopy  2011    HYSTEROSCOPY, D&C  . Dilation and evacuation  04/02/2011    Procedure: DILATATION AND EVACUATION (D&E);  Surgeon: Luz Lex, MD;  Location: [facility];  Service: Gynecology;  Laterality: N/A;  dvt left mid thigh  . Dilation and curettage of uterus      poloyps  . Foot surgery      plantar fasciitis    Family History  Problem Relation Age of Onset  . Diabetes Father   . Cancer Father     COLON  . Heart disease Father   . Kidney disease Father     dialysis  . Diabetes Sister   . Cancer Maternal Grandmother     UTERINE  . Cancer Paternal Grandmother     breast    History  Substance Use Topics  . Smoking status: Never Smoker   . Smokeless tobacco: Never Used  . Alcohol Use: No     Allergies:  Allergies  Allergen Reactions  . Codeine Itching and Nausea Only  . Cymbalta [Duloxetine Hcl] Other (See Comments)    Altered mental status    Prescriptions prior to admission  Medication Sig Dispense Refill  . ferrous sulfate 325 (65 FE) MG tablet Take 325 mg by mouth daily with breakfast.      . heparin 10000 UNIT/ML injection Inject 10,000 Units into the skin every 12 (twelve) hours.      . ondansetron (ZOFRAN-ODT) 4 MG disintegrating tablet Take 4 mg by mouth every 8 (eight) hours as needed for nausea or vomiting.      . Prenatal Vit-Fe Fumarate-FA (PRENATAL MULTIVITAMIN) TABS tablet Take 1 tablet by mouth every morning.       . promethazine (PHENERGAN) 25 MG tablet Take 1 tablet (25 mg total) by mouth every 6 (six) hours as needed for nausea or vomiting.  30 tablet  1    Review of Systems  Constitutional: Negative for fever and chills.  Gastrointestinal: Positive for abdominal pain (intermittent cramping). Negative for nausea and  vomiting.  Genitourinary:       + perceived fetal movement.  Neg for bleeding or loss of fluid  Neurological:       Non acute left sided " numbness"     Physical Exam   Blood pressure 114/79, pulse 122, temperature 98.8 F (37.1 C), resp. rate 16, last menstrual period 08/02/2013.  Physical Exam  Constitutional: She is oriented to person, place, and time. She appears well-developed and well-nourished. No distress.  HENT:  Head: Normocephalic.  Respiratory: Effort normal.  Genitourinary:  Cervical exam by Christina-3-4 cm dilated--unchanged from exam in office this am.  Neurological: She is alert and oriented to person, place, and time.  Skin: Skin is warm and dry.  Psychiatric: She has a normal mood and affect. Her behavior is normal.   BPP results was reported 8/8-prelim report.  U/S tech states when 8/8 they don't send to perinatologist to read.    FMS baseline 140-- minimal to moderate variability.  Reactive.  Results  for orders placed during the hospital encounter of 04/22/14 (from the past 24 hour(s))  CBC WITH DIFFERENTIAL     Status: Abnormal   Collection Time    04/22/14  3:05 PM      Result Value Ref Range   WBC 9.1  4.0 - 10.5 K/uL   RBC 4.21  3.87 - 5.11 MIL/uL   Hemoglobin 10.5 (*) 12.0 - 15.0 g/dL   HCT 32.5 (*) 36.0 - 46.0 %   MCV 77.2 (*) 78.0 - 100.0 fL   MCH 24.9 (*) 26.0 - 34.0 pg   MCHC 32.3  30.0 - 36.0 g/dL   RDW 15.7 (*) 11.5 - 15.5 %   Platelets 149 (*) 150 - 400 K/uL   Neutrophils Relative % 68  43 - 77 %   Neutro Abs 6.2  1.7 - 7.7 K/uL   Lymphocytes Relative 19  12 - 46 %   Lymphs Abs 1.7  0.7 - 4.0 K/uL   Monocytes Relative 11  3 - 12 %   Monocytes Absolute 1.0  0.1 - 1.0 K/uL   Eosinophils Relative 2  0 - 5 %   Eosinophils Absolute 0.2  0.0 - 0.7 K/uL   Basophils Relative 0  0 - 1 %   Basophils Absolute 0.0  0.0 - 0.1 K/uL  COMPREHENSIVE METABOLIC PANEL     Status: Abnormal   Collection Time    04/22/14  3:05 PM      Result Value Ref Range   Sodium 136 (*) 137 - 147 mEq/L   Potassium 4.2  3.7 - 5.3 mEq/L   Chloride 105  96 - 112 mEq/L   CO2 21  19 - 32 mEq/L   Glucose, Bld 74  70 - 99 mg/dL   BUN 8  6 - 23 mg/dL   Creatinine, Ser 0.69  0.50 - 1.10 mg/dL   Calcium 8.9  8.4 - 10.5 mg/dL   Total Protein 6.8  6.0 - 8.3 g/dL   Albumin 2.7 (*) 3.5 - 5.2 g/dL   AST 20  0 - 37 U/L   ALT 10  0 - 35 U/L   Alkaline Phosphatase 232 (*) 39 - 117 U/L   Total Bilirubin 0.3  0.3 - 1.2 mg/dL   GFR calc non Af Amer >90  >90 mL/min   GFR calc Af Amer >90  >90 mL/min   Anion gap 10  5 - 15   MAU Course  Procedures  MDM Patient returned from  U/S vomiting.   14:55  Reported BPP results, patient's vomiting since returning to MAU.  Order given for CBC, CMP and Zofran 8mg  ODT.   15:15  Went in to check on patient, she is not vomiting.  Zofran has been given and labs drawn.  Explained the need for labs 17:00 reported labs to Dr. Lynnette Caffey.  Discharge to home.  Follow up with Dr.  Gaetano Net on Thursday.  Report LOF, bleeding, decreased fetal movement  Assessment and Plan  A:  Fetal well being at [redacted]w[redacted]d gestation       Reactive NST, BPP 8/8  P:  Discharge to home      Patient instructed to call MD for loss of fluid, contractions, vaginal bleeding or decreased fetal movement     Follow up with Dr. Gaetano Net on Thursday  Belinda Fisher 04/22/2014, 1:26 PM

## 2014-04-22 NOTE — MAU Note (Signed)
Patient was seen in the office today and NST was not reactive. Sent to MAU for monitoring.

## 2014-04-29 ENCOUNTER — Inpatient Hospital Stay (HOSPITAL_COMMUNITY)
Admission: RE | Admit: 2014-04-29 | Discharge: 2014-05-01 | DRG: 774 | Disposition: A | Discharge status: Home / Self Care | Payer: BC Managed Care – PPO | Source: Ambulatory Visit | Attending: Obstetrics and Gynecology | Admitting: Obstetrics and Gynecology

## 2014-04-29 ENCOUNTER — Encounter (HOSPITAL_COMMUNITY): Payer: BC Managed Care – PPO | Admitting: Anesthesiology

## 2014-04-29 ENCOUNTER — Encounter (HOSPITAL_COMMUNITY): Payer: Self-pay

## 2014-04-29 ENCOUNTER — Inpatient Hospital Stay (HOSPITAL_COMMUNITY): Payer: BC Managed Care – PPO | Admitting: Anesthesiology

## 2014-04-29 DIAGNOSIS — O98519 Other viral diseases complicating pregnancy, unspecified trimester: Secondary | ICD-10-CM | POA: Diagnosis present

## 2014-04-29 DIAGNOSIS — D649 Anemia, unspecified: Secondary | ICD-10-CM | POA: Diagnosis present

## 2014-04-29 DIAGNOSIS — O99344 Other mental disorders complicating childbirth: Secondary | ICD-10-CM | POA: Diagnosis present

## 2014-04-29 DIAGNOSIS — O1002 Pre-existing essential hypertension complicating childbirth: Secondary | ICD-10-CM | POA: Diagnosis present

## 2014-04-29 DIAGNOSIS — F3289 Other specified depressive episodes: Secondary | ICD-10-CM | POA: Diagnosis present

## 2014-04-29 DIAGNOSIS — Z3490 Encounter for supervision of normal pregnancy, unspecified, unspecified trimester: Secondary | ICD-10-CM

## 2014-04-29 DIAGNOSIS — K219 Gastro-esophageal reflux disease without esophagitis: Secondary | ICD-10-CM | POA: Diagnosis present

## 2014-04-29 DIAGNOSIS — F329 Major depressive disorder, single episode, unspecified: Secondary | ICD-10-CM | POA: Diagnosis present

## 2014-04-29 DIAGNOSIS — O99891 Other specified diseases and conditions complicating pregnancy: Secondary | ICD-10-CM | POA: Diagnosis present

## 2014-04-29 DIAGNOSIS — O9902 Anemia complicating childbirth: Secondary | ICD-10-CM | POA: Diagnosis present

## 2014-04-29 DIAGNOSIS — Z86718 Personal history of other venous thrombosis and embolism: Secondary | ICD-10-CM

## 2014-04-29 DIAGNOSIS — A6000 Herpesviral infection of urogenital system, unspecified: Secondary | ICD-10-CM | POA: Diagnosis present

## 2014-04-29 DIAGNOSIS — O09529 Supervision of elderly multigravida, unspecified trimester: Secondary | ICD-10-CM | POA: Diagnosis present

## 2014-04-29 LAB — TYPE AND SCREEN
ABO/RH(D): B POS
Antibody Screen: NEGATIVE

## 2014-04-29 LAB — CBC
HCT: 32.7 % — ABNORMAL LOW (ref 36.0–46.0)
Hemoglobin: 10.8 g/dL — ABNORMAL LOW (ref 12.0–15.0)
MCH: 25.6 pg — ABNORMAL LOW (ref 26.0–34.0)
MCHC: 33 g/dL (ref 30.0–36.0)
MCV: 77.5 fL — ABNORMAL LOW (ref 78.0–100.0)
Platelets: 143 10*3/uL — ABNORMAL LOW (ref 150–400)
RBC: 4.22 MIL/uL (ref 3.87–5.11)
RDW: 16.1 % — ABNORMAL HIGH (ref 11.5–15.5)
WBC: 7.8 10*3/uL (ref 4.0–10.5)

## 2014-04-29 LAB — RPR

## 2014-04-29 LAB — ABO/RH: ABO/RH(D): B POS

## 2014-04-29 MED ORDER — DIPHENHYDRAMINE HCL 50 MG/ML IJ SOLN: 12.5000 mg | INTRAMUSCULAR | Status: DC | PRN

## 2014-04-29 MED ORDER — ACETAMINOPHEN 325 MG PO TABS
650.0000 mg | ORAL_TABLET | ORAL | Status: DC | PRN
  Administered 2014-04-29: 650 mg via ORAL
  Filled 2014-04-29: qty 2

## 2014-04-29 MED ORDER — ONDANSETRON HCL 4 MG/2ML IJ SOLN: 4.0000 mg | Freq: Four times a day (QID) | INTRAMUSCULAR | Status: DC | PRN

## 2014-04-29 MED ORDER — FENTANYL 2.5 MCG/ML BUPIVACAINE 1/10 % EPIDURAL INFUSION (WH - ANES)
14.0000 mL/h | INTRAMUSCULAR | Status: DC | PRN
  Administered 2014-04-29 (×2): 14 mL/h via EPIDURAL
  Filled 2014-04-29 (×2): qty 125

## 2014-04-29 MED ORDER — PHENYLEPHRINE 40 MCG/ML (10ML) SYRINGE FOR IV PUSH (FOR BLOOD PRESSURE SUPPORT)
80.0000 ug | PREFILLED_SYRINGE | INTRAVENOUS | Status: DC | PRN
  Filled 2014-04-29: qty 10
  Filled 2014-04-29: qty 2

## 2014-04-29 MED ORDER — LACTATED RINGERS IV SOLN: 500.0000 mL | INTRAVENOUS | Status: DC | PRN

## 2014-04-29 MED ORDER — OXYTOCIN 40 UNITS IN LACTATED RINGERS INFUSION - SIMPLE MED
INTRAVENOUS | Status: AC
  Filled 2014-04-29: qty 1000

## 2014-04-29 MED ORDER — LACTATED RINGERS IV SOLN
INTRAVENOUS | Status: DC
  Administered 2014-04-29: 07:00:00 via INTRAVENOUS

## 2014-04-29 MED ORDER — LIDOCAINE HCL (PF) 1 % IJ SOLN
30.0000 mL | INTRAMUSCULAR | Status: DC | PRN
  Filled 2014-04-29: qty 30

## 2014-04-29 MED ORDER — IBUPROFEN 600 MG PO TABS
600.0000 mg | ORAL_TABLET | Freq: Four times a day (QID) | ORAL | Status: DC | PRN
  Administered 2014-04-30: 600 mg via ORAL
  Filled 2014-04-29: qty 1

## 2014-04-29 MED ORDER — OXYTOCIN 40 UNITS IN LACTATED RINGERS INFUSION - SIMPLE MED
1.0000 m[IU]/min | INTRAVENOUS | Status: DC
  Administered 2014-04-29: 1 m[IU]/min via INTRAVENOUS

## 2014-04-29 MED ORDER — EPHEDRINE 5 MG/ML INJ
10.0000 mg | INTRAVENOUS | Status: DC | PRN
  Filled 2014-04-29: qty 2

## 2014-04-29 MED ORDER — LACTATED RINGERS IV SOLN
500.0000 mL | Freq: Once | INTRAVENOUS | Status: AC
  Administered 2014-04-29: 10:00:00 via INTRAVENOUS

## 2014-04-29 MED ORDER — FENTANYL 2.5 MCG/ML BUPIVACAINE 1/10 % EPIDURAL INFUSION (WH - ANES)
INTRAMUSCULAR | Status: DC | PRN
  Administered 2014-04-29: 16 mL/h via EPIDURAL

## 2014-04-29 MED ORDER — TERBUTALINE SULFATE 1 MG/ML IJ SOLN: 0.2500 mg | Freq: Once | INTRAMUSCULAR | Status: AC | PRN

## 2014-04-29 MED ORDER — CITRIC ACID-SODIUM CITRATE 334-500 MG/5ML PO SOLN: 30.0000 mL | ORAL | Status: DC | PRN

## 2014-04-29 MED ORDER — LIDOCAINE HCL (PF) 1 % IJ SOLN
INTRAMUSCULAR | Status: DC | PRN
  Administered 2014-04-29 (×2): 5 mL

## 2014-04-29 MED ORDER — OXYTOCIN 40 UNITS IN LACTATED RINGERS INFUSION - SIMPLE MED: 62.5000 mL/h | INTRAVENOUS | Status: DC

## 2014-04-29 MED ORDER — OXYTOCIN BOLUS FROM INFUSION
500.0000 mL | INTRAVENOUS | Status: DC
  Administered 2014-04-29: 500 mL via INTRAVENOUS

## 2014-04-29 NOTE — H&P (Signed)
  Ashley [name]  DICTATION # [id number] CSN# [id number]   Margarette Asal, MD 04/29/2014 9:20 AM

## 2014-04-29 NOTE — H&P (Signed)
Ashley [name], HERRMANN NO.:  [id number]  MEDICAL RECORD NO.:  [id number]  LOCATION:  9169                          FACILITY:  [facility]  PHYSICIAN:  Ralene Bathe. Matthew Saras, M.D.DATE OF BIRTH:  03/04/1976  DATE OF ADMISSION:  04/29/2014 DATE OF DISCHARGE:                             HISTORY & PHYSICAL   CHIEF COMPLAINT:  For labor induction at term.  HISTORY OF PRESENT ILLNESS:  A 38 year old, G4, P-0-1-2-1 GBS screen is negative.  The patient has a history of VTE in the past and has been on Lovenox 40 mg subcu daily for prophylaxis during this pregnancy. Recently was switched over to heparin and induction was scheduled today at term with a favorable cervix.  Her last dose of heparin was today prior to admission.  She has been bothered by severe lower back pain that has required bed rest, heat, and pain medications.  Early pregnancy she had normal panoramic screen.  She had been on Valtrex once daily for a history of HSV with no recent outbreaks.  Blood type is B positive.  PAST MEDICAL HISTORY:  ALLERGIES:  CYMBALTA and CODEINE.  For the remainder of her past medical history, please see her [facility] form.  PHYSICAL EXAMINATION:  VITAL SIGNS:  Temp 98.2, blood pressure 120/80. HEENT:  Unremarkable. NECK:  Supple without masses. LUNGS:  Clear. CARDIOVASCULAR:  Regular rate and rhythm without murmurs, rubs, or gallops. BREASTS:  Not examined. PELVIC:  Term fundal height.  Fetal heart rate 140, cervix was 3, 75, vertex -2. EXTREMITIES:  Unremarkable. NEUROLOGIC:  Unremarkable.  IMPRESSION:  Term pregnancy, favorable cervix for induction, history of venous thromboembolism prophylaxis has been on Lovenox, more recently heparin last dose yesterday, on p.o. Valtrex for HSV prophylaxis, GBS negative.  PLAN:  AROM labor induction.  Procedure and risks discussed with the patient.     Ruhaan Nordahl M. Matthew Saras, M.D.     [facility]/MEDQ  D:  04/29/2014  T:  04/29/2014  Job:   [id number]

## 2014-04-29 NOTE — Anesthesia Procedure Notes (Signed)
Epidural Patient location during procedure: OB Start time: 04/29/2014 10:18 AM  Staffing Anesthesiologist: Kenyon Eichelberger A. Performed by: anesthesiologist   Preanesthetic Checklist Completed: patient identified, site marked, surgical consent, pre-op evaluation, timeout performed, IV checked, risks and benefits discussed and monitors and equipment checked  Epidural Patient position: sitting Prep: site prepped and draped and DuraPrep Patient monitoring: continuous pulse ox and blood pressure Approach: midline Location: L4-L5 Injection technique: LOR air  Needle:  Needle type: Tuohy  Needle gauge: 17 G Needle length: 9 cm and 9 Needle insertion depth: 9 cm Catheter type: closed end flexible Catheter size: 19 Gauge Catheter at skin depth: 14 cm Test dose: negative and Other  Assessment Events: blood not aspirated, injection not painful, no injection resistance, negative IV test and no paresthesia  Additional Notes Patient identified. Risks and benefits discussed including failed block, incomplete  Pain control, post dural puncture headache, nerve damage, paralysis, blood pressure Changes, nausea, vomiting, reactions to medications-both toxic and allergic and post Partum back pain. All questions were answered. Patient expressed understanding and wished to proceed. Sterile technique was used throughout procedure. Epidural site was Dressed with sterile barrier dressing. No paresthesias, signs of intravascular injection Or signs of intrathecal spread were encountered.  Patient was more comfortable after the epidural was dosed. Please see RN's note for documentation of vital signs and FHR which are stable.

## 2014-04-29 NOTE — Anesthesia Preprocedure Evaluation (Signed)
Anesthesia Evaluation  Patient identified by MRN, date of birth, ID band Patient awake    Reviewed: Allergy & Precautions, H&P , Patient's Chart, lab work & pertinent test results  Airway Mallampati: III TM Distance: >3 FB Neck ROM: Full    Dental no notable dental hx. (+) Teeth Intact   Pulmonary neg pulmonary ROS,  breath sounds clear to auscultation  Pulmonary exam normal       Cardiovascular hypertension, Rhythm:Regular Rate:Normal  Hx/o DVT on unfractionated Heparin 5,000 u Q12H - last dose 8pm   Neuro/Psych  Headaches, PSYCHIATRIC DISORDERS Depression    GI/Hepatic Neg liver ROS, GERD-  Medicated and Controlled,  Endo/Other  Morbid obesityPCOS  Renal/GU negative Renal ROS  negative genitourinary   Musculoskeletal negative musculoskeletal ROS (+) AMA   Abdominal Normal abdominal exam  (+) + obese,   Peds  Hematology  (+) anemia ,   Anesthesia Other Findings   Reproductive/Obstetrics (+) Pregnancy HSV                           Anesthesia Physical  Anesthesia Plan  ASA: III  Anesthesia Plan: Epidural   Post-op Pain Management:    Induction:   Airway Management Planned: Natural Airway  Additional Equipment:   Intra-op Plan:   Post-operative Plan:   Informed Consent: I have reviewed the patients History and Physical, chart, labs and discussed the procedure including the risks, benefits and alternatives for the proposed anesthesia with the patient or authorized representative who has indicated his/her understanding and acceptance.     Plan Discussed with: Anesthesiologist  Anesthesia Plan Comments:         Anesthesia Quick Evaluation  History of bulging discs (patient unaware of which levels being treated with epidural steroid injections).  Switching from lovenox to heparin.  Discussed timing of epidural.  We will check PTT prior to placement.  Her questions were  answered.

## 2014-04-29 NOTE — Progress Notes (Signed)
Now 5/C/vtx, FHR cat 1, will slowly incr pit/

## 2014-04-30 LAB — CBC
HCT: 29.6 % — ABNORMAL LOW (ref 36.0–46.0)
Hemoglobin: 9.6 g/dL — ABNORMAL LOW (ref 12.0–15.0)
MCH: 24.9 pg — ABNORMAL LOW (ref 26.0–34.0)
MCHC: 32.4 g/dL (ref 30.0–36.0)
MCV: 76.7 fL — ABNORMAL LOW (ref 78.0–100.0)
Platelets: 130 10*3/uL — ABNORMAL LOW (ref 150–400)
RBC: 3.86 MIL/uL — ABNORMAL LOW (ref 3.87–5.11)
RDW: 15.8 % — ABNORMAL HIGH (ref 11.5–15.5)
WBC: 17.3 10*3/uL — ABNORMAL HIGH (ref 4.0–10.5)

## 2014-04-30 LAB — CREATININE, SERUM
Creatinine, Ser: 1.05 mg/dL (ref 0.50–1.10)
GFR calc Af Amer: 76 mL/min — ABNORMAL LOW (ref >90)
GFR calc non Af Amer: 66 mL/min — ABNORMAL LOW (ref >90)

## 2014-04-30 MED ORDER — LANOLIN HYDROUS EX OINT
TOPICAL_OINTMENT | CUTANEOUS | Status: DC | PRN
Start: 2014-04-30 — End: 2014-05-01

## 2014-04-30 MED ORDER — IBUPROFEN 800 MG PO TABS
800.0000 mg | ORAL_TABLET | Freq: Three times a day (TID) | ORAL | Status: DC | PRN
  Administered 2014-04-30 – 2014-05-01 (×4): 800 mg via ORAL
  Filled 2014-04-30 (×4): qty 1

## 2014-04-30 MED ORDER — PRENATAL MULTIVITAMIN CH
1.0000 | ORAL_TABLET | Freq: Every day | ORAL | Status: DC
  Administered 2014-04-30 – 2014-05-01 (×2): 1 via ORAL
  Filled 2014-04-30 (×2): qty 1

## 2014-04-30 MED ORDER — WITCH HAZEL-GLYCERIN EX PADS: 1.0000 "application " | MEDICATED_PAD | CUTANEOUS | Status: DC | PRN

## 2014-04-30 MED ORDER — ONDANSETRON HCL 4 MG PO TABS: 4.0000 mg | ORAL_TABLET | ORAL | Status: DC | PRN

## 2014-04-30 MED ORDER — BENZOCAINE-MENTHOL 20-0.5 % EX AERO
1.0000 | INHALATION_SPRAY | CUTANEOUS | Status: DC | PRN
Start: 2014-04-30 — End: 2014-05-01
  Administered 2014-04-30 (×2): 1 via TOPICAL
  Filled 2014-04-30 (×2): qty 56

## 2014-04-30 MED ORDER — DIPHENHYDRAMINE HCL 25 MG PO CAPS
25.0000 mg | ORAL_CAPSULE | Freq: Four times a day (QID) | ORAL | Status: DC | PRN
Start: 2014-04-30 — End: 2014-05-01

## 2014-04-30 MED ORDER — ONDANSETRON HCL 4 MG/2ML IJ SOLN: 4.0000 mg | INTRAMUSCULAR | Status: DC | PRN

## 2014-04-30 MED ORDER — BISACODYL 10 MG RE SUPP: 10.0000 mg | Freq: Every day | RECTAL | Status: DC | PRN

## 2014-04-30 MED ORDER — ENOXAPARIN SODIUM 40 MG/0.4ML ~~LOC~~ SOLN
40.0000 mg | SUBCUTANEOUS | Status: DC
  Administered 2014-04-30 – 2014-05-01 (×2): 40 mg via SUBCUTANEOUS
  Filled 2014-04-30 (×3): qty 0.4

## 2014-04-30 MED ORDER — SENNOSIDES-DOCUSATE SODIUM 8.6-50 MG PO TABS
2.0000 | ORAL_TABLET | ORAL | Status: DC
  Administered 2014-04-30: 2 via ORAL
  Filled 2014-04-30: qty 2

## 2014-04-30 MED ORDER — MEASLES, MUMPS & RUBELLA VAC ~~LOC~~ INJ
0.5000 mL | INJECTION | Freq: Once | SUBCUTANEOUS | Status: DC
  Filled 2014-04-30: qty 0.5

## 2014-04-30 MED ORDER — FLEET ENEMA 7-19 GM/118ML RE ENEM: 1.0000 | ENEMA | Freq: Every day | RECTAL | Status: DC | PRN

## 2014-04-30 MED ORDER — DIBUCAINE 1 % RE OINT: 1.0000 "application " | TOPICAL_OINTMENT | RECTAL | Status: DC | PRN

## 2014-04-30 MED ORDER — SIMETHICONE 80 MG PO CHEW
80.0000 mg | CHEWABLE_TABLET | ORAL | Status: DC | PRN
Start: 2014-04-30 — End: 2014-05-01

## 2014-04-30 MED ORDER — OXYCODONE-ACETAMINOPHEN 5-325 MG PO TABS
1.0000 | ORAL_TABLET | Freq: Four times a day (QID) | ORAL | Status: DC | PRN
  Administered 2014-04-30 – 2014-05-01 (×3): 2 via ORAL
  Filled 2014-04-30 (×4): qty 2

## 2014-04-30 MED ORDER — TETANUS-DIPHTH-ACELL PERTUSSIS 5-2.5-18.5 LF-MCG/0.5 IM SUSP
0.5000 mL | Freq: Once | INTRAMUSCULAR | Status: DC
Start: 2014-04-30 — End: 2014-05-01

## 2014-04-30 MED ORDER — ZOLPIDEM TARTRATE 5 MG PO TABS: 5.0000 mg | ORAL_TABLET | Freq: Every evening | ORAL | Status: DC | PRN

## 2014-04-30 NOTE — Anesthesia Postprocedure Evaluation (Signed)
  Anesthesia Post-op Note  Patient: Ashley [name]  Procedure(s) Performed: * No procedures listed *  Patient Location: PACU and Mother/Baby  Anesthesia Type:Epidural  Level of Consciousness: awake, alert  and oriented  Airway and Oxygen Therapy: Patient Spontanous Breathing  Post-op Pain: mild  Post-op Assessment: Patient's Cardiovascular Status Stable, Respiratory Function Stable, No signs of Nausea or vomiting, Adequate PO intake, Pain level controlled and No headache  Post-op Vital Signs: Reviewed and stable  Last Vitals:  Filed Vitals:   04/30/14 0610  BP: 108/68  Pulse: 92  Temp: 36.7 C  Resp: 18    Complications: No apparent anesthesia complications

## 2014-04-30 NOTE — Progress Notes (Signed)
Clinical Social Work Department PSYCHOSOCIAL ASSESSMENT - MATERNAL/CHILD 04/30/2014  Patient:  Ashley [name], Ashley [name]  Account Number:  [id number]  Admit Date:  04/29/2014  [name] Name:   [name]    Clinical Social Worker:  Terri Piedra, LCSW   Date/Time:  04/30/2014 01:00 PM  Date Referred:  04/30/2014   Referral source  CN     Referred reason  Behavioral Health Issues   Other referral source:    I:  FAMILY / [address] legal guardian:  PARENT  Guardian - Name Guardian - Age Guardian - Address  Ashley [name] [address], [place], [place] [address]   Other household support members/support persons Name Relationship DOB   OTHER    Other support:   MOB states she has a great support system.  She reports her mother, sister and roommate (grandmother type figure) are her greatest support people.  MOB reports FOB is involved and supportive.  He is her husband, however, they are currently legally separated.    II  PSYCHOSOCIAL DATA Information Source:  Patient Interview  Occupational hygienist Employment:   Patent examiner resources:  Multimedia programmer If Ashley [name]:  [employer] / Grade:   Maternity Care Coordinator / Child Services Coordination / Early Interventions:  Cultural issues impacting care:   None stated    III  STRENGTHS Strengths  Adequate Resources  Compliance with medical plan  Home prepared for Child (including basic supplies)  Other - See comment  Supportive family/friends   Strength comment:  Pediatric follow up will be at Ashley [name] Current Problem:  None   Risk Factor & Current Problem Patient Issue Family Issue Risk Factor / Current Problem Comment   N N     V  SOCIAL WORK ASSESSMENT CSW met with MOB in her first floor room to complete assessment for hx of depression.  MOB was very pleasant and welcoming of CSW.  She  states she and baby are doing well at this time.  She reports no concerns.  She reports a hx of depression in 2012 after a miscarriage.  She reports symptoms were isolated to the event and that she took medication and saw a therapist for less than a year.  She was receptive to CSW's education on PPD signs and symptoms and states she feels comfortable calling her doctor if she has emotional concerns at any time.  She also reports that she can call her therapist if necessary also.  She declines need for medication/therapy at this time.  She reports feeling happy about becoming a mother again and states she has a great support system.  CSW has no social concerns at this time.      VI SOCIAL WORK PLAN Social Work Plan  No Further Intervention Required / No Barriers to Discharge  Patient/Family Education   Type of pt/family education:   PPD signs and symptoms   If child protective services report - county:   If child protective services report - date:   Information/referral to community resources comment:   No referral needs noted   Other social work plan:

## 2014-04-30 NOTE — Progress Notes (Addendum)
Post Partum Day 1 Subjective: no complaints, up ad lib, voiding, tolerating PO and + flatus  Objective: Blood pressure 108/68, pulse 92, temperature 98 F (36.7 C), temperature source Oral, resp. rate 18, height 5\' 11"  (1.803 m), weight 320 lb (145.151 kg), last menstrual period 08/02/2013, SpO2 99.00%, unknown if currently breastfeeding.  Physical Exam:  General: alert and cooperative Lochia: appropriate Uterine Fundus: firm Incision: perineum intact DVT Evaluation: No evidence of DVT seen on physical exam. Negative Homan's sign. No cords or calf tenderness.   Recent Labs  04/29/14 0720 04/30/14 0615  HGB 10.8* 9.6*  HCT 32.7* 29.6*    Assessment/Plan: Plan for discharge tomorrow Start Lovenox today  LOS: 1 day   Ashley [name] G 04/30/2014, 8:55 AM

## 2014-04-30 NOTE — Lactation Note (Signed)
This note was copied from the chart of Ashley Pegeen Stiger. Lactation Consultation Note First time BF, LGA 10.9 lbs. Supplementing w/formula, plans to breast and bottle feed w/formula. Mom has large pendulum shaped breast w/Lg. Everted nipples. Areolas compressible. Assisted in different positions for deeper depth of latch. Hx. Of PCOS, no DEBP available, manual pump given to post pump to stimulate breast. Instructed to post-pump for 10 min. After feedings and any colostrum obtained give to baby. Instructed to elevate breast w/cloth so it would pull and mom would have to hold breast up. Mom encouraged to feed baby 8-12 times/24 hours and with feeding cues. Mom reports + breast changes w/pregnancy. Encouraged to call for assistance if needed and to verify proper latch.Hand expression taught to Mom. [place] brochure given w/resources, support groups and [place] services. Educated about newborn behavior. Referred to Baby and Me Book in Breastfeeding section Pg. 22-23 for position options and Proper latch demonstration.Encouraged comfort during BF so colostrum flows better and mom will enjoy the feeding longer. Taking deep breaths and breast massage during BF. Mom encouraged to do skin-to-skin. RN gave information on volume parameters based on day of life.  Patient Name: Ashley [name] JHERD'E Date: 04/30/2014 Reason for consult: Initial assessment   Maternal Data Reason for exclusion: Mother's choice to formula feed on admision Has patient been taught Hand Expression?: Yes Does the patient have breastfeeding experience prior to this delivery?: No  Feeding Feeding Type: Breast Fed Length of feed: 10 min  LATCH Score/Interventions Latch: Repeated attempts needed to sustain latch, nipple held in mouth throughout feeding, stimulation needed to elicit sucking reflex. Intervention(s): Adjust position;Assist with latch;Breast massage;Breast compression  Audible Swallowing:  None Intervention(s): Skin to skin;Hand expression Intervention(s): Alternate breast massage  Type of Nipple: Everted at rest and after stimulation  Comfort (Breast/Nipple): Soft / non-tender     Hold (Positioning): Assistance needed to correctly position infant at breast and maintain latch. Intervention(s): Breastfeeding basics reviewed;Support Pillows;Position options;Skin to skin  LATCH Score: 6  Lactation Tools Discussed/Used Tools: Pump;Flanges Flange Size: 30 Breast pump type: Manual Pump Review: Setup, frequency, and cleaning;Milk Storage Initiated by:: Allayne Stack RN Date initiated:: 04/30/14   Consult Status Consult Status: Follow-up Date: 05/01/14 Follow-up type: In-patient    Theodoro Kalata 04/30/2014, 5:34 PM

## 2014-05-01 MED ORDER — ENOXAPARIN SODIUM 40 MG/0.4ML ~~LOC~~ SOLN: 40.0000 mg | SUBCUTANEOUS | Status: DC

## 2014-05-01 MED ORDER — HYDROMORPHONE HCL 2 MG PO TABS
2.0000 mg | ORAL_TABLET | ORAL | Status: DC | PRN
  Administered 2014-05-01: 2 mg via ORAL
  Filled 2014-05-01: qty 1

## 2014-05-01 MED ORDER — HYDROMORPHONE HCL 2 MG PO TABS: 2.0000 mg | ORAL_TABLET | ORAL | Status: DC | PRN

## 2014-05-01 NOTE — Discharge Summary (Signed)
Obstetric Discharge Summary Reason for Admission: induction of labor Prenatal Procedures: ultrasound Intrapartum Procedures: spontaneous vaginal delivery Postpartum Procedures: none Complications-Operative and Postpartum: 2 degree perineal laceration HGB  Date Value Ref Range Status  12/30/2010 11.6  11.6 - 15.9 g/dL Final     Hemoglobin  Date Value Ref Range Status  04/30/2014 9.6* 12.0 - 15.0 g/dL Final     HCT  Date Value Ref Range Status  04/30/2014 29.6* 36.0 - 46.0 % Final  12/30/2010 35.3  34.8 - 46.6 % Final    Physical Exam:  General: alert and cooperative Lochia: appropriate Uterine Fundus: firm Incision: perineum intact DVT Evaluation: No evidence of DVT seen on physical exam. Negative Homan's sign. No cords or calf tenderness. Calf/Ankle edema is present.  Discharge Diagnoses: Term Pregnancy-delivered  Discharge Information: Date: 05/01/2014 Activity: pelvic rest Diet: routine Medications: PNV and Dilaudid labetalol Condition: stable Instructions: refer to practice specific booklet Discharge to: home   Newborn Data: Live born female  Birth Weight: 10 lb 9.2 oz (4797 g) APGAR: 8, 9  Home with mother.  Alleya Demeter G 05/01/2014, 9:10 AM

## 2014-05-01 NOTE — Progress Notes (Signed)
Post Partum Day 2 Subjective: up ad lib, voiding, tolerating PO and complains of pelvic tenderness Baby with possible late discharge today,  Objective: Blood pressure 122/77, pulse 96, temperature 98.6 F (37 C), temperature source Oral, resp. rate 20, height 5\' 11"  (1.803 m), weight 320 lb (145.151 kg), last menstrual period 08/02/2013, SpO2 99.00%, unknown if currently breastfeeding.  Physical Exam:  General: alert and cooperative Lochia: appropriate Uterine Fundus: firm Incision: perineum intact DVT Evaluation: No evidence of DVT seen on physical exam. Negative Homan's sign. No cords or calf tenderness. Calf/Ankle edema is present. dtr's 2+   Recent Labs  04/29/14 0720 04/30/14 0615  HGB 10.8* 9.6*  HCT 32.7* 29.6*    Assessment/Plan: Discharge home   LOS: 2 days   CURTIS,CAROL G 05/01/2014, 9:04 AM

## 2014-05-02 ENCOUNTER — Encounter (HOSPITAL_COMMUNITY): Payer: Self-pay | Admitting: *Deleted

## 2014-05-02 ENCOUNTER — Inpatient Hospital Stay (HOSPITAL_COMMUNITY)
Admission: AD | Admit: 2014-05-02 | Discharge: 2014-05-02 | Disposition: A | Discharge status: Home / Self Care | Payer: BC Managed Care – PPO | Source: Ambulatory Visit | Attending: Obstetrics and Gynecology | Admitting: Obstetrics and Gynecology

## 2014-05-02 DIAGNOSIS — R600 Localized edema: Secondary | ICD-10-CM

## 2014-05-02 DIAGNOSIS — R609 Edema, unspecified: Secondary | ICD-10-CM | POA: Diagnosis not present

## 2014-05-02 DIAGNOSIS — O9989 Other specified diseases and conditions complicating pregnancy, childbirth and the puerperium: Secondary | ICD-10-CM

## 2014-05-02 DIAGNOSIS — IMO0002 Reserved for concepts with insufficient information to code with codable children: Secondary | ICD-10-CM | POA: Insufficient documentation

## 2014-05-02 DIAGNOSIS — O99893 Other specified diseases and conditions complicating puerperium: Secondary | ICD-10-CM | POA: Diagnosis not present

## 2014-05-02 DIAGNOSIS — R109 Unspecified abdominal pain: Secondary | ICD-10-CM | POA: Insufficient documentation

## 2014-05-02 MED ORDER — HYDROMORPHONE HCL PF 1 MG/ML IJ SOLN
1.0000 mg | Freq: Once | INTRAMUSCULAR | Status: AC
  Administered 2014-05-02: 1 mg via INTRAMUSCULAR
  Filled 2014-05-02: qty 1

## 2014-05-02 NOTE — Discharge Instructions (Signed)
Vaginal Delivery, Care After °Refer to this sheet in the next few weeks. These discharge instructions provide you with information on caring for yourself after delivery. Your caregiver may also give you specific instructions. Your treatment has been planned according to the most current medical practices available, but problems sometimes occur. Call your caregiver if you have any problems or questions after you go home. °HOME CARE INSTRUCTIONS °· Take over-the-counter or prescription medicines only as directed by your caregiver or pharmacist. °· Do not drink alcohol, especially if you are breastfeeding or taking medicine to relieve pain. °· Do not chew or smoke tobacco. °· Do not use illegal drugs. °· Continue to use good perineal care. Good perineal care includes: °¨ Wiping your perineum from front to back. °¨ Keeping your perineum clean. °· Do not use tampons or douche until your caregiver says it is okay. °· Shower, wash your hair, and take tub baths as directed by your caregiver. °· Wear a well-fitting bra that provides breast support. °· Eat healthy foods. °· Drink enough fluids to keep your urine clear or pale yellow. °· Eat high-fiber foods such as whole grain cereals and breads, brown rice, beans, and fresh fruits and vegetables every day. These foods may help prevent or relieve constipation. °· Follow your caregiver's recommendations regarding resumption of activities such as climbing stairs, driving, lifting, exercising, or traveling. °· Talk to your caregiver about resuming sexual activities. Resumption of sexual activities is dependent upon your risk of infection, your rate of healing, and your comfort and desire to resume sexual activity. °· Try to have someone help you with your household activities and your newborn for at least a few days after you leave the hospital. °· Rest as much as possible. Try to rest or take a nap when your newborn is sleeping. °· Increase your activities gradually. °· Keep  all of your scheduled postpartum appointments. It is very important to keep your scheduled follow-up appointments. At these appointments, your caregiver will be checking to make sure that you are healing physically and emotionally. °SEEK MEDICAL CARE IF:  °· You are passing large clots from your vagina. Save any clots to show your caregiver. °· You have a foul smelling discharge from your vagina. °· You have trouble urinating. °· You are urinating frequently. °· You have pain when you urinate. °· You have a change in your bowel movements. °· You have increasing redness, pain, or swelling near your vaginal incision (episiotomy) or vaginal tear. °· You have pus draining from your episiotomy or vaginal tear. °· Your episiotomy or vaginal tear is separating. °· You have painful, hard, or reddened breasts. °· You have a severe headache. °· You have blurred vision or see spots. °· You feel sad or depressed. °· You have thoughts of hurting yourself or your newborn. °· You have questions about your care, the care of your newborn, or medicines. °· You are dizzy or light-headed. °· You have a rash. °· You have nausea or vomiting. °· You were breastfeeding and have not had a menstrual period within 12 weeks after you stopped breastfeeding. °· You are not breastfeeding and have not had a menstrual period by the 12th week after delivery. °· You have a fever. °SEEK IMMEDIATE MEDICAL CARE IF:  °· You have persistent pain. °· You have chest pain. °· You have shortness of breath. °· You faint. °· You have leg pain. °· You have stomach pain. °· Your vaginal bleeding saturates two or more sanitary pads   in 1 hour. °MAKE SURE YOU:  °· Understand these instructions. °· Will watch your condition. °· Will get help right away if you are not doing well or get worse. °Document Released: 09/03/2000 Document Revised: 01/21/2014 Document Reviewed: 05/03/2012 °ExitCare® Patient Information ©2015 [employer]. This information is not intended to  replace advice given to you by your health care provider. Make sure you discuss any questions you have with your health care provider. ° °

## 2014-05-02 NOTE — MAU Note (Signed)
Pt s/p vaginal delivery on 04/29/2014, increased swelling in hands and feet, lower back pain, lower abd cramping.  Burning with urination.

## 2014-05-02 NOTE — MAU Provider Note (Signed)
History     CSN: [id number]  Arrival date and time: 05/02/14 5102   First Provider Initiated Contact with Patient 05/02/14 0423      No chief complaint on file.  HPI [name] is a 38 y.o. [id number] who is PPD # 3 after a vaginal delivery. The patient complains today of abdominal pain and BLE edema. She rates her pain at 6/10 now. She last took PO Dilaudid ~ 2 hours prior to arrival. The patient is on Lovenox at this time for history of DVT. She denies issues with HTN during her pregnancy or otherwise. She denies headache or RUQ pain today.   OB History   Grav Para Term Preterm Abortions TAB SAB Ect Mult Living   4 2 1 1 2 1 1   2       Past Medical History  Diagnosis Date  . PCOS (polycystic ovarian syndrome)   . Endometrial polyp   . Infertility   . Hypertension     BORDERLINE  . Miscarriage 03/2011  . Depression   . Back pain   . DVT (deep venous thrombosis) 2012    left leg  . AMA (advanced maternal age) multigravida 57+   . HSV (herpes simplex virus) anogenital infection   . Headache(784.0)   . Chronic pain     Past Surgical History  Procedure Laterality Date  . Knee surgery      X4-5  . Ankle surgery      X2  . 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 Hysteroscopy  2011    HYSTEROSCOPY, D&C  . Dilation and evacuation  04/02/2011    Procedure: DILATATION AND EVACUATION (D&E);  Surgeon: Luz Lex, MD;  Location: [facility];  Service: Gynecology;  Laterality: N/A;  dvt left mid thigh  . Dilation and curettage of uterus      poloyps  . Foot surgery      plantar fasciitis    Family History  Problem Relation Age of Onset  . Diabetes Father   . Cancer Father     COLON  . Heart disease Father   . Kidney disease Father     dialysis  . Diabetes Sister   . Cancer Maternal Grandmother     UTERINE  . Cancer Paternal Grandmother     breast    History  Substance Use Topics  . Smoking status: Never Smoker   . Smokeless tobacco: Never  Used  . Alcohol Use: No    Allergies:  Allergies  Allergen Reactions  . Codeine Itching and Nausea Only  . Cymbalta [Duloxetine Hcl] Other (See Comments)    Altered mental status    Prescriptions prior to admission  Medication Sig Dispense Refill  . enoxaparin (LOVENOX) 40 MG/0.4ML injection Inject 0.4 mLs (40 mg total) into the skin daily.  30 Syringe  1  . HYDROmorphone (DILAUDID) 2 MG tablet Take 1 tablet (2 mg total) by mouth every 4 (four) hours as needed for severe pain.  30 tablet  0  . Prenatal Vit-Fe Fumarate-FA (PRENATAL MULTIVITAMIN) TABS tablet Take 1 tablet by mouth every morning.       . ferrous sulfate 325 (65 FE) MG tablet Take 325 mg by mouth daily with breakfast.      . valACYclovir (VALTREX) 500 MG tablet Take 500 mg by mouth 2 (two) times daily.        Review of Systems  Constitutional: Negative for fever and malaise/fatigue.  Gastrointestinal:  Positive for abdominal pain. Negative for nausea and vomiting.  Genitourinary:       + vaginal bleeding   Physical Exam   Blood pressure 139/80, pulse 111, temperature 98.2 F (36.8 C), temperature source Oral, resp. rate 20, height 5\' 11"  (1.803 m), weight 318 lb (144.244 kg), SpO2 100.00%, unknown if currently breastfeeding.  Physical Exam  Constitutional: She is oriented to person, place, and time. She appears well-developed and well-nourished.  HENT:  Head: Normocephalic.  Cardiovascular: Normal rate, regular rhythm and normal heart sounds.   Respiratory: Effort normal and breath sounds normal. No respiratory distress.  GI: Soft. She exhibits no distension and no mass. There is tenderness (mild diffuse tenderness to palpation of the abdmen). There is no rebound and no guarding.  Fundus is palpated just above the umbilicus and is firm  Musculoskeletal: She exhibits edema (2+ pitting edema bilaterally to the mid shin).  Neurological: She is alert and oriented to person, place, and time. She has normal reflexes.   No clonus  Skin: Skin is warm and dry. No erythema.    MAU Course  Procedures None  MDM Patient unable to give a urine sample upon admission to MAU Discussed patient with Dr. Radene Knee. Patient should increased her Dilaudid frequency to q 3 hours. Follow-up in the office.   Assessment and Plan  A: PPD #3 s/p SVD Bilateral lower extremity edema Abdominal pain  P: Discharge home Patient advised to take previously prescribed Dilaudid q 3 hours PRN for pain Patient encouraged to follow-up in the office as scheduled or if her condition were to change or worsen sooner Patient may return to MAU as needed or if her condition were to change or worsen   Farris Has, PA-C  05/02/2014, 4:24 AM

## 2014-05-05 ENCOUNTER — Encounter (HOSPITAL_COMMUNITY): Payer: Self-pay | Admitting: Emergency Medicine

## 2014-05-05 ENCOUNTER — Emergency Department (HOSPITAL_COMMUNITY)
Admission: EM | Admit: 2014-05-05 | Discharge: 2014-05-05 | Disposition: A | Discharge status: Home / Self Care | Payer: BC Managed Care – PPO | Source: Home / Self Care | Attending: Emergency Medicine | Admitting: Emergency Medicine

## 2014-05-05 ENCOUNTER — Emergency Department (INDEPENDENT_AMBULATORY_CARE_PROVIDER_SITE_OTHER): Payer: BC Managed Care – PPO

## 2014-05-05 DIAGNOSIS — R609 Edema, unspecified: Secondary | ICD-10-CM

## 2014-05-05 DIAGNOSIS — R600 Localized edema: Secondary | ICD-10-CM

## 2014-05-05 DIAGNOSIS — M654 Radial styloid tenosynovitis [de Quervain]: Secondary | ICD-10-CM

## 2014-05-05 MED ORDER — DICLOFENAC EPOLAMINE 1.3 % TD PTCH: 1.0000 | MEDICATED_PATCH | Freq: Two times a day (BID) | TRANSDERMAL | Status: DC

## 2014-05-05 NOTE — ED Provider Notes (Signed)
CSN: [id number]     Arrival date & time 05/05/14  1353 History   First MD Initiated Contact with Patient 05/05/14 1442     Chief Complaint  Patient presents with  . Wrist Pain  . Foot Swelling   (Consider location/radiation/quality/duration/timing/severity/associated sxs/prior Treatment) HPI Comments: 38 year old female who is 5 days post NSVD of a 10 pound healthy baby presents for evaluation of left wrist pain and bilateral lower extremity swelling/edema. She is mainly worried about her wrist because this is affecting her ability to hold her baby. She has had constant, gradually worsening pain in her wrist from the base of her thumb up to the radial forearm. This is constant and is worse with any movement or any attempted grasping. She is worried that this is going to make her drop her baby so she wanted to be checked immediately. She denies any injury but she states that while she was in the hospital, she had an epidural and had to push herself around in bed with her arms, she thinks she may have injured her wrist in the process.  She has had worsening lower extremity swelling and edema since she had her baby on Monday. She went to [facility] for this on Thursday, 3 days ago, and was told to followup in the office. At that time, no testing was done. She is having increasing pain due to the swelling. The pain is affecting her ability to walk, and she has pain with attempting to wiggle her toes. No numbness in the legs. She has a history of DVT and she is currently on 40 mg subcutaneous Lovenox injection daily. She has a history of leg swelling that was previously treated with elevation and compression stockings, she has been elevating the legs and using the stockings but the swelling will improve. No chest pain or shortness of breath  Additionally, she complains of continued abdominal pain, but that is improving. She has vaginal pain that is severe with urination, but according to the  medical record she experienced a second degree perineal tear during delivery. She has 2 mg hydromorphone to take every 3 hours when necessary prescribed for this and this is being followed by her OB/GYN.   Past Medical History  Diagnosis Date  . PCOS (polycystic ovarian syndrome)   . Endometrial polyp   . Infertility   . Hypertension     BORDERLINE  . Miscarriage 03/2011  . Depression   . Back pain   . DVT (deep venous thrombosis) 2012    left leg  . AMA (advanced maternal age) multigravida 75+   . HSV (herpes simplex virus) anogenital infection   . Headache(784.0)   . Chronic pain    Past Surgical History  Procedure Laterality Date  . Knee surgery      X4-5  . Ankle surgery      X2  . 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 Hysteroscopy  2011    HYSTEROSCOPY, D&C  . Dilation and evacuation  04/02/2011    Procedure: DILATATION AND EVACUATION (D&E);  Surgeon: Luz Lex, MD;  Location: [facility];  Service: Gynecology;  Laterality: N/A;  dvt left mid thigh  . Dilation and curettage of uterus      poloyps  . Foot surgery      plantar fasciitis   Family History  Problem Relation Age of Onset  . Diabetes Father   . Cancer Father     COLON  .  Heart disease Father   . Kidney disease Father     dialysis  . Diabetes Sister   . Cancer Maternal Grandmother     UTERINE  . Cancer Paternal Grandmother     breast   History  Substance Use Topics  . Smoking status: Never Smoker   . Smokeless tobacco: Never Used  . Alcohol Use: No   OB History   Grav Para Term Preterm Abortions TAB SAB Ect Mult Living   4 2 1 1 2 1 1   2      Review of Systems  Respiratory: Negative for cough and shortness of breath.   Cardiovascular: Positive for leg swelling. Negative for chest pain.  Gastrointestinal: Positive for abdominal pain. Negative for nausea, vomiting and diarrhea.  Genitourinary: Positive for dysuria, vaginal bleeding and vaginal pain.  Musculoskeletal:       See history  of present illness regarding wrist pain  All other systems reviewed and are negative.   Allergies  Codeine and Cymbalta  Home Medications   Prior to Admission medications   Medication Sig Start Date End Date Taking? Authorizing Provider  Prenatal Vit-Fe Fumarate-FA (PRENATAL MULTIVITAMIN) TABS tablet Take 1 tablet by mouth every morning.    Yes Historical Provider, MD  diclofenac (FLECTOR) 1.3 % PTCH Place 1 patch onto the skin 2 (two) times daily. 05/05/14   Freeman Caldron Other Atienza, PA-C  enoxaparin (LOVENOX) 40 MG/0.4ML injection Inject 0.4 mLs (40 mg total) into the skin daily. 05/01/14   Damien Fusi, NP  ferrous sulfate 325 (65 FE) MG tablet Take 325 mg by mouth daily with breakfast.    Historical Provider, MD  HYDROmorphone (DILAUDID) 2 MG tablet Take 1 tablet (2 mg total) by mouth every 4 (four) hours as needed for severe pain. 05/01/14   Damien Fusi, NP  valACYclovir (VALTREX) 500 MG tablet Take 500 mg by mouth 2 (two) times daily.    Historical Provider, MD   BP 133/91  Pulse 89  Temp(Src) 99 F (37.2 C) (Oral)  Resp 20  SpO2 97%  Breastfeeding? Yes Physical Exam  Nursing note and vitals reviewed. Constitutional: She is oriented to person, place, and time. Vital signs are normal. She appears well-developed and well-nourished. No distress.  HENT:  Head: Normocephalic and atraumatic.  Cardiovascular:  Pulses:      Radial pulses are 2+ on the right side, and 2+ on the left side.  Pitting edema of the bilateral lower extremities up to the mid calf, with tenderness  Pulmonary/Chest: Effort normal. No respiratory distress.  Musculoskeletal:       Left wrist: She exhibits tenderness (Exquisite tenderness in the base of the thumb up through the distal third of the forearm ). She exhibits normal range of motion, no bony tenderness and no swelling.  Positive Finkelstein's test  Neurological: She is alert and oriented to person, place, and time. She has normal strength. Coordination  normal.  Skin: Skin is warm and dry. No rash noted. She is not diaphoretic.  Psychiatric: She has a normal mood and affect. Judgment normal.    ED Course  Procedures (including critical care time) Labs Review Labs Reviewed - No data to display  Imaging Review Dg Wrist Complete Left  05/05/2014   CLINICAL DATA:  Wrist injury from pulling.  EXAM: LEFT WRIST - COMPLETE 3+ VIEW  COMPARISON:  None.  FINDINGS: There is no evidence of fracture or dislocation. There is no evidence of arthropathy or other focal bone abnormality. Soft tissues  are unremarkable.  IMPRESSION: Negative.   Electronically Signed   By: Lajean Manes M.D.   On: 05/05/2014 14:10     MDM   1. De Quervain's tenosynovitis, left    The wrist pain can be explained by the remainder synovitis, will treat with flexor patch and thumb spica splints for 2 weeks and followup with orthopedics if not resolved at that time.   She needs to be evaluated for the lower extremity edema. The differential diagnosis for this includes venous insufficiency, renal insufficiency, peripartum cardiomyopathy. She says that she will followup with her OB/GYN for this tomorrow. I advised that she go to the emergency department if they cannot see her tomorrow to rule out more emergent conditions, she agrees to this. As long as they can see her tomorrow, she does not necessarily need to go to the emergency department right now. She is not having other symptoms of heart failure and is urinating a normal amount. We have limited staffing today and do not have anyone to perform x-rays or any labs so we cannot initiate any workup of this here today.  Liam Graham, PA-C 05/05/14 [phone]

## 2014-05-05 NOTE — ED Notes (Signed)
Pt c/o left wrist pain onset 2 days Hurts to make a fist and to carry things Pain increases w/activity; denies inj/trauma Sx also include: numbness/tingly of fingers  Also c/o bilateral foot swelling Seen at [facility] hosp for this on 8/13 Ambulated well to exam room w/steady gait; NAD Alert; no signs of acute distress

## 2014-05-05 NOTE — Discharge Instructions (Signed)
De Quervain's Tenosynovitis De Quervain's tenosynovitis involves inflammation of one or two tendon linings (sheaths) or strain of one or two tendons to the thumb: extensor pollicis brevis (EPB), or abductor pollicis longus (APL). This causes pain on the side of the wrist and base of the thumb. Tendon sheaths secrete a fluid that lubricates the tendon, allowing the tendon to move smoothly. When the sheath becomes inflamed, the tendon cannot move freely in the sheath. Both the EPB and APL tendons are important for proper use of the hand. The EPB tendon is important for straightening the thumb. The APL tendon is important for moving the thumb away from the index finger (abducting). The two tendons pass through a small tube (canal) in the wrist, near the base of the thumb. When the tendons become inflamed, pain is usually felt in this area. SYMPTOMS   Pain, tenderness, swelling, warmth, or redness over the base of the thumb and thumb side of the wrist.  Pain that gets worse when straightening the thumb.  Pain that gets worse when moving the thumb away from the index finger, against resistance.  Pain with pinching or gripping.  Locking or catching of the thumb.  Limited motion of the thumb.  Crackling sound (crepitation) when the tendon or thumb is moved or touched.  Fluid-filled cyst in the area of the base of the thumb. CAUSES   Tenosynovitis is often linked with overuse of the wrist.  Tenosynovitis may be caused by repeated injury to the thumb muscle and tendon units, and with repeated motions of the hand and wrist, due to friction of the tendon within the lining (sheath).  Tenosynovitis may also be due to a sudden increase in activity or change in activity. RISK INCREASES WITH:  Sports that involve repeated hand and wrist motions (golf, bowling, tennis, squash, racquetball).  Heavy labor.  Poor physical wrist strength and flexibility.  Failure to warm up properly before practice or  play.  Female gender.  New mothers who hold their baby's head for long periods or lift infants with thumbs in the infant's armpit (axilla). PREVENTION  Warm up and stretch properly before practice or competition.  Allow enough time for rest and recovery between practices and competition.  Maintain appropriate conditioning:  Cardiovascular fitness.  Forearm, wrist, and hand flexibility.  Muscle strength and endurance.  Use proper exercise technique. PROGNOSIS  This condition is usually curable within 6 weeks, if treated properly with non-surgical treatment and resting of the affected area.  RELATED COMPLICATIONS   Longer healing time if not properly treated or if not given enough time to heal.  Chronic inflammation, causing recurring symptoms of tenosynovitis. Permanent pain or restriction of movement.  Risks of surgery: infection, bleeding, injury to nerves (numbness of the thumb), continued pain, incomplete release of the tendon sheath, recurring symptoms, cutting of the tendons, tendons sliding out of position, weakness of the thumb, thumb stiffness. TREATMENT  First, treatment involves the use of medicine and ice, to reduce pain and inflammation. Patients are encouraged to stop or modify activities that aggravate the injury. Stretching and strengthening exercises may be advised. Exercises may be completed at home or with a therapist. You may be fitted with a brace or splint, to limit motion and allow the injury to heal. Your caregiver may also choose to give you a corticosteroid injection, to reduce the pain and inflammation. If non-surgical treatment is not successful, surgery may be needed. Most tenosynovitis surgeries are done as outpatient procedures (you go home the   same day). Surgery may involve local, regional (whole arm), or general anesthesia.  MEDICATION   If pain medicine is needed, nonsteroidal anti-inflammatory medicines (aspirin and ibuprofen), or other minor pain  relievers (acetaminophen), are often advised.  Do not take pain medicine for 7 days before surgery.  Prescription pain relievers are often prescribed only after surgery. Use only as directed and only as much as you need.  Corticosteroid injections may be given if your caregiver thinks they are needed. There is a limited number of times these injections may be given. COLD THERAPY   Cold treatment (icing) should be applied for 10 to 15 minutes every 2 to 3 hours for inflammation and pain, and immediately after activity that aggravates your symptoms. Use ice packs or an ice massage. SEEK MEDICAL CARE IF:   Symptoms get worse or do not improve in 2 to 4 weeks, despite treatment.  You experience pain, numbness, or coldness in the hand.  Blue, gray, or dark color appears in the fingernails.  Any of the following occur after surgery: increased pain, swelling, redness, drainage of fluids, bleeding in the affected area, or signs of infection.  New, unexplained symptoms develop. (Drugs used in treatment may produce side effects.) Document Released: 09/06/2005 Document Revised: 11/29/2011 Document Reviewed: 12/19/2008 Exit[employer] Patient Information 2015 [employer]. This information is not intended to replace advice given to you by your health care provider. Make sure you discuss any questions you have with your health care provider.   

## 2014-05-06 ENCOUNTER — Inpatient Hospital Stay (HOSPITAL_COMMUNITY)
Admission: AD | Admit: 2014-05-06 | Discharge: 2014-05-06 | Disposition: A | Discharge status: Home / Self Care | Payer: BC Managed Care – PPO | Source: Ambulatory Visit | Attending: Obstetrics and Gynecology | Admitting: Obstetrics and Gynecology

## 2014-05-06 ENCOUNTER — Encounter (HOSPITAL_COMMUNITY): Payer: Self-pay | Admitting: *Deleted

## 2014-05-06 DIAGNOSIS — IMO0002 Reserved for concepts with insufficient information to code with codable children: Secondary | ICD-10-CM | POA: Insufficient documentation

## 2014-05-06 DIAGNOSIS — O1205 Gestational edema, complicating the puerperium: Secondary | ICD-10-CM

## 2014-05-06 LAB — COMPREHENSIVE METABOLIC PANEL
ALT: 29 U/L (ref 0–35)
AST: 28 U/L (ref 0–37)
Albumin: 2.8 g/dL — ABNORMAL LOW (ref 3.5–5.2)
Alkaline Phosphatase: 177 U/L — ABNORMAL HIGH (ref 39–117)
Anion gap: 10 (ref 5–15)
BUN: 9 mg/dL (ref 6–23)
CO2: 26 mEq/L (ref 19–32)
Calcium: 8.4 mg/dL (ref 8.4–10.5)
Chloride: 104 mEq/L (ref 96–112)
Creatinine, Ser: 0.82 mg/dL (ref 0.50–1.10)
GFR calc Af Amer: >90 mL/min (ref >90)
GFR calc non Af Amer: 89 mL/min — ABNORMAL LOW (ref >90)
Glucose, Bld: 74 mg/dL (ref 70–99)
Potassium: 3.9 mEq/L (ref 3.7–5.3)
Sodium: 140 mEq/L (ref 137–147)
Total Bilirubin: 0.3 mg/dL (ref 0.3–1.2)
Total Protein: 6.2 g/dL (ref 6.0–8.3)

## 2014-05-06 LAB — CBC
HCT: 31.4 % — ABNORMAL LOW (ref 36.0–46.0)
Hemoglobin: 10.1 g/dL — ABNORMAL LOW (ref 12.0–15.0)
MCH: 24.8 pg — ABNORMAL LOW (ref 26.0–34.0)
MCHC: 32.2 g/dL (ref 30.0–36.0)
MCV: 77 fL — ABNORMAL LOW (ref 78.0–100.0)
Platelets: 212 10*3/uL (ref 150–400)
RBC: 4.08 MIL/uL (ref 3.87–5.11)
RDW: 16.8 % — ABNORMAL HIGH (ref 11.5–15.5)
WBC: 8.4 10*3/uL (ref 4.0–10.5)

## 2014-05-06 MED ORDER — FUROSEMIDE 20 MG PO TABS: 20.0000 mg | ORAL_TABLET | Freq: Every day | ORAL | Status: DC

## 2014-05-06 NOTE — MAU Note (Addendum)
States had concerns regarding swelling and back pain prior to d/c.  Both have gotten worse.swelling up to knees,tight and tender to touch. Trying to breast feed, baby got used to bottle.

## 2014-05-06 NOTE — Discharge Instructions (Signed)
Postpartum Depression and Baby Blues °The postpartum period begins right after the birth of a baby. During this time, there is often a great amount of joy and excitement. It is also a time of many changes in the life of the parents. Regardless of how many times a mother gives birth, each child brings new challenges and dynamics to the family. It is not unusual to have feelings of excitement along with confusing shifts in moods, emotions, and thoughts. All mothers are at risk of developing postpartum depression or the "baby blues." These mood changes can occur right after giving birth, or they may occur many months after giving birth. The baby blues or postpartum depression can be mild or severe. Additionally, postpartum depression can go away rather quickly, or it can be a long-term condition.  °CAUSES °Raised hormone levels and the rapid drop in those levels are thought to be a main cause of postpartum depression and the baby blues. A number of hormones change during and after pregnancy. Estrogen and progesterone usually decrease right after the delivery of your baby. The levels of thyroid hormone and various cortisol steroids also rapidly drop. Other factors that play a role in these mood changes include major life events and genetics.  °RISK FACTORS °If you have any of the following risks for the baby blues or postpartum depression, know what symptoms to watch out for during the postpartum period. Risk factors that may increase the likelihood of getting the baby blues or postpartum depression include: °· Having a personal or family history of depression.   °· Having depression while being pregnant.   °· Having premenstrual mood issues or mood issues related to oral contraceptives. °· Having a lot of life stress.   °· Having marital conflict.   °· Lacking a social support network.   °· Having a baby with special needs.   °· Having health problems, such as diabetes.   °SIGNS AND SYMPTOMS °Symptoms of baby blues  include: °· Brief changes in mood, such as going from extreme happiness to sadness. °· Decreased concentration.   °· Difficulty sleeping.   °· Crying spells, tearfulness.   °· Irritability.   °· Anxiety.   °Symptoms of postpartum depression typically begin within the first month after giving birth. These symptoms include: °· Difficulty sleeping or excessive sleepiness.   °· Marked weight loss.   °· Agitation.   °· Feelings of worthlessness.   °· Lack of interest in activity or food.   °Postpartum psychosis is a very serious condition and can be dangerous. Fortunately, it is rare. Displaying any of the following symptoms is cause for immediate medical attention. Symptoms of postpartum psychosis include:  °· Hallucinations and delusions.   °· Bizarre or disorganized behavior.   °· Confusion or disorientation.   °DIAGNOSIS  °A diagnosis is made by an evaluation of your symptoms. There are no medical or lab tests that lead to a diagnosis, but there are various questionnaires that a health care provider may use to identify those with the baby blues, postpartum depression, or psychosis. Often, a screening tool called the Edinburgh Postnatal Depression Scale is used to diagnose depression in the postpartum period.  °TREATMENT °The baby blues usually goes away on its own in 1-2 weeks. Social support is often all that is needed. You will be encouraged to get adequate sleep and rest. Occasionally, you may be given medicines to help you sleep.  °Postpartum depression requires treatment because it can last several months or longer if it is not treated. Treatment may include individual or group therapy, medicine, or both to address any social, physiological, and psychological   factors that may play a role in the depression. Regular exercise, a healthy diet, rest, and social support may also be strongly recommended.  Postpartum psychosis is more serious and needs treatment right away. Hospitalization is often needed. HOME CARE  INSTRUCTIONS  Get as much rest as you can. Nap when the baby sleeps.   Exercise regularly. Some women find yoga and walking to be beneficial.   Eat a balanced and nourishing diet.   Do little things that you enjoy. Have a cup of tea, take a bubble bath, read your favorite magazine, or listen to your favorite music.  Avoid alcohol.   Ask for help with household chores, cooking, grocery shopping, or running errands as needed. Do not try to do everything.   Talk to people close to you about how you are feeling. Get support from your partner, family members, friends, or other new moms.  Try to stay positive in how you think. Think about the things you are grateful for.   Do not spend a lot of time alone.   Only take over-the-counter or prescription medicine as directed by your health care provider.  Keep all your postpartum appointments.   Let your health care provider know if you have any concerns.  SEEK MEDICAL CARE IF: You are having a reaction to or problems with your medicine. SEEK IMMEDIATE MEDICAL CARE IF:  You have suicidal feelings.   You think you may harm the baby or someone else. MAKE SURE YOU:  Understand these instructions.  Will watch your condition.  Will get help right away if you are not doing well or get worse. Document Released: 06/10/2004 Document Revised: 09/11/2013 Document Reviewed: 06/18/2013 [employer] Patient Information 2015 [employer]. This information is not intended to replace advice given to you by your health care provider. Make sure you discuss any questions you have with your health care provider.  

## 2014-05-06 NOTE — ED Provider Notes (Signed)
Medical screening examination/treatment/procedure(s) were performed by non-physician practitioner and as supervising physician I was immediately available for consultation/collaboration.  Philipp Deputy, M.D.  Harden Mo, MD 05/06/14 [phone]

## 2014-05-06 NOTE — MAU Note (Signed)
Patient states she had a vaginal delivery on 8-10. Home health nurse visit today and her blood pressure was elevated. Patient denies headaches but has had increasing swelling in the feet and legs. Has some abdominal pain off and on.

## 2014-05-06 NOTE — MAU Provider Note (Signed)
History     CSN: [id number]  Arrival date and time: 05/06/14 1746   First Provider Initiated Contact with Patient 05/06/14 2042      Chief Complaint  Patient presents with  . Hypertension  . Leg Swelling   Hypertension    Ashley [name] is a 38 y.o. a [id number] who is  Week s/p NSVD on  04/29/14. She is here today with BLE edema. She denies any headache or blurry vision. She states that she will get a headache occasionally, but she does not have one now. She was seen on 8/13 for similar complaints and on 8/16 she was seen at the [facility] for wrist pain. She states that shew as told she has strain, and the wrist is currently in a brace.    Past Medical History  Diagnosis Date  . PCOS (polycystic ovarian syndrome)   . Endometrial polyp   . Infertility   . Miscarriage 03/2011  . Back pain   . DVT (deep venous thrombosis) 2012    left leg  . AMA (advanced maternal age) multigravida 98+   . HSV (herpes simplex virus) anogenital infection   . Headache(784.0)   . Chronic pain   . Depression     good now    Past Surgical History  Procedure Laterality Date  . Knee surgery      X4-5  . Ankle surgery      X2  . 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 Hysteroscopy  2011    HYSTEROSCOPY, D&C  . Dilation and evacuation  04/02/2011    Procedure: DILATATION AND EVACUATION (D&E);  Surgeon: Luz Lex, MD;  Location: [facility];  Service: Gynecology;  Laterality: N/A;  dvt left mid thigh  . Dilation and curettage of uterus      poloyps  . Foot surgery      plantar fasciitis  . Shoulder arthroscopy Right     Family History  Problem Relation Age of Onset  . Diabetes Father   . Cancer Father     COLON  . Heart disease Father   . Kidney disease Father     dialysis  . Diabetes Sister   . Cancer Maternal Grandmother     UTERINE  . Cancer Paternal Grandmother     breast    History  Substance Use Topics  . Smoking status: Never Smoker   . Smokeless tobacco: Never  Used  . Alcohol Use: No    Allergies:  Allergies  Allergen Reactions  . Codeine Itching and Nausea Only  . Cymbalta [Duloxetine Hcl] Other (See Comments)    Altered mental status    Prescriptions prior to admission  Medication Sig Dispense Refill  . enoxaparin (LOVENOX) 40 MG/0.4ML injection Inject 0.4 mLs (40 mg total) into the skin daily.  30 Syringe  1  . ferrous sulfate 325 (65 FE) MG tablet Take 325 mg by mouth daily with breakfast.      . HYDROmorphone (DILAUDID) 2 MG tablet Take 1 tablet (2 mg total) by mouth every 4 (four) hours as needed for severe pain.  30 tablet  0  . Prenatal Vit-Fe Fumarate-FA (PRENATAL MULTIVITAMIN) TABS tablet Take 1 tablet by mouth every morning.       . valACYclovir (VALTREX) 500 MG tablet Take 500 mg by mouth 2 (two) times daily.      . diclofenac (FLECTOR) 1.3 % PTCH Place 1 patch onto the skin 2 (two) times daily.  30 patch  0    ROS Physical Exam   Blood pressure 149/89, pulse 82, temperature 98.8 F (37.1 C), temperature source Oral, resp. rate 18, height 5\' 11"  (1.803 m), weight 142.611 kg (314 lb 6.4 oz), SpO2 99.00%, currently breastfeeding.  Physical Exam  Nursing note and vitals reviewed. Constitutional: She is oriented to person, place, and time. She appears well-developed and well-nourished. No distress.  Cardiovascular: Normal rate.   Respiratory: Effort normal. No respiratory distress. She has no wheezes. She has no rales.  GI: Soft. There is no tenderness.  Musculoskeletal: She exhibits edema. She exhibits no tenderness (3+ edema in both feet, and 1-2+ edema up to the knee ).  Neurological: She is alert and oriented to person, place, and time.  Skin: Skin is warm and dry.  Psychiatric: She has a normal mood and affect.    MAU Course  Procedures Results for orders placed during the hospital encounter of 05/06/14 (from the past 24 hour(s))  CBC     Status: Abnormal   Collection Time    05/06/14  8:29 PM      Result Value  Ref Range   WBC 8.4  4.0 - 10.5 K/uL   RBC 4.08  3.87 - 5.11 MIL/uL   Hemoglobin 10.1 (*) 12.0 - 15.0 g/dL   HCT 31.4 (*) 36.0 - 46.0 %   MCV 77.0 (*) 78.0 - 100.0 fL   MCH 24.8 (*) 26.0 - 34.0 pg   MCHC 32.2  30.0 - 36.0 g/dL   RDW 16.8 (*) 11.5 - 15.5 %   Platelets 212  150 - 400 K/uL  COMPREHENSIVE METABOLIC PANEL     Status: Abnormal   Collection Time    05/06/14  8:29 PM      Result Value Ref Range   Sodium 140  137 - 147 mEq/L   Potassium 3.9  3.7 - 5.3 mEq/L   Chloride 104  96 - 112 mEq/L   CO2 26  19 - 32 mEq/L   Glucose, Bld 74  70 - 99 mg/dL   BUN 9  6 - 23 mg/dL   Creatinine, Ser 0.82  0.50 - 1.10 mg/dL   Calcium 8.4  8.4 - 10.5 mg/dL   Total Protein 6.2  6.0 - 8.3 g/dL   Albumin 2.8 (*) 3.5 - 5.2 g/dL   AST 28  0 - 37 U/L   ALT 29  0 - 35 U/L   Alkaline Phosphatase 177 (*) 39 - 117 U/L   Total Bilirubin 0.3  0.3 - 1.2 mg/dL   GFR calc non Af Amer 89 (*) >90 mL/min   GFR calc Af Amer >90  >90 mL/min   Anion gap 10  5 - 15   2144: D/W Dr. Helane Rima, will dc home with 20 mg lasix q day for 3 days. Recommend [facility] visit, and have her FU with OB tomorrow.   Assessment and Plan   1. Edema in pregnancy, postpartum condition    Lasix 20mg  q day for 3 days PP depression/baby blues info discussed with the patient  Return to MAU as needed Call OB for work in appointment tomorrow Call [facility] for visit as well  Mathis Bud 05/06/2014, 8:45 PM

## 2014-05-20 ENCOUNTER — Emergency Department (HOSPITAL_BASED_OUTPATIENT_CLINIC_OR_DEPARTMENT_OTHER)
Admission: EM | Admit: 2014-05-20 | Discharge: 2014-05-20 | Disposition: A | Discharge status: Home / Self Care | Payer: BC Managed Care – PPO | Attending: Emergency Medicine | Admitting: Emergency Medicine

## 2014-05-20 ENCOUNTER — Emergency Department (HOSPITAL_BASED_OUTPATIENT_CLINIC_OR_DEPARTMENT_OTHER): Payer: BC Managed Care – PPO

## 2014-05-20 ENCOUNTER — Encounter (HOSPITAL_BASED_OUTPATIENT_CLINIC_OR_DEPARTMENT_OTHER): Payer: Self-pay | Admitting: Emergency Medicine

## 2014-05-20 DIAGNOSIS — F3289 Other specified depressive episodes: Secondary | ICD-10-CM | POA: Insufficient documentation

## 2014-05-20 DIAGNOSIS — Z79899 Other long term (current) drug therapy: Secondary | ICD-10-CM | POA: Diagnosis not present

## 2014-05-20 DIAGNOSIS — Y9389 Activity, other specified: Secondary | ICD-10-CM | POA: Diagnosis not present

## 2014-05-20 DIAGNOSIS — Z8619 Personal history of other infectious and parasitic diseases: Secondary | ICD-10-CM | POA: Insufficient documentation

## 2014-05-20 DIAGNOSIS — M25559 Pain in unspecified hip: Secondary | ICD-10-CM | POA: Insufficient documentation

## 2014-05-20 DIAGNOSIS — Z87798 Personal history of other (corrected) congenital malformations: Secondary | ICD-10-CM | POA: Insufficient documentation

## 2014-05-20 DIAGNOSIS — X500XXA Overexertion from strenuous movement or load, initial encounter: Secondary | ICD-10-CM | POA: Diagnosis not present

## 2014-05-20 DIAGNOSIS — IMO0002 Reserved for concepts with insufficient information to code with codable children: Secondary | ICD-10-CM | POA: Insufficient documentation

## 2014-05-20 DIAGNOSIS — I158 Other secondary hypertension: Secondary | ICD-10-CM | POA: Insufficient documentation

## 2014-05-20 DIAGNOSIS — Y92009 Unspecified place in unspecified non-institutional (private) residence as the place of occurrence of the external cause: Secondary | ICD-10-CM | POA: Insufficient documentation

## 2014-05-20 DIAGNOSIS — Z86718 Personal history of other venous thrombosis and embolism: Secondary | ICD-10-CM | POA: Insufficient documentation

## 2014-05-20 DIAGNOSIS — G8929 Other chronic pain: Secondary | ICD-10-CM | POA: Diagnosis not present

## 2014-05-20 DIAGNOSIS — F329 Major depressive disorder, single episode, unspecified: Secondary | ICD-10-CM | POA: Insufficient documentation

## 2014-05-20 DIAGNOSIS — S76012A Strain of muscle, fascia and tendon of left hip, initial encounter: Secondary | ICD-10-CM

## 2014-05-20 DIAGNOSIS — I159 Secondary hypertension, unspecified: Secondary | ICD-10-CM

## 2014-05-20 LAB — BASIC METABOLIC PANEL
Anion gap: 11 (ref 5–15)
BUN: 11 mg/dL (ref 6–23)
CO2: 26 mEq/L (ref 19–32)
Calcium: 9.1 mg/dL (ref 8.4–10.5)
Chloride: 105 mEq/L (ref 96–112)
Creatinine, Ser: 0.8 mg/dL (ref 0.50–1.10)
GFR calc Af Amer: >90 mL/min (ref >90)
GFR calc non Af Amer: >90 mL/min (ref >90)
Glucose, Bld: 88 mg/dL (ref 70–99)
Potassium: 3.7 mEq/L (ref 3.7–5.3)
Sodium: 142 mEq/L (ref 137–147)

## 2014-05-20 LAB — CBC WITH DIFFERENTIAL/PLATELET
Basophils Absolute: 0.1 10*3/uL (ref 0.0–0.1)
Basophils Relative: 2 % — ABNORMAL HIGH (ref 0–1)
Eosinophils Absolute: 0.3 10*3/uL (ref 0.0–0.7)
Eosinophils Relative: 5 % (ref 0–5)
HCT: 35 % — ABNORMAL LOW (ref 36.0–46.0)
Hemoglobin: 11.1 g/dL — ABNORMAL LOW (ref 12.0–15.0)
Lymphocytes Relative: 29 % (ref 12–46)
Lymphs Abs: 1.6 10*3/uL (ref 0.7–4.0)
MCH: 24.8 pg — ABNORMAL LOW (ref 26.0–34.0)
MCHC: 31.7 g/dL (ref 30.0–36.0)
MCV: 78.3 fL (ref 78.0–100.0)
Monocytes Absolute: 0.5 10*3/uL (ref 0.1–1.0)
Monocytes Relative: 9 % (ref 3–12)
Neutro Abs: 2.9 10*3/uL (ref 1.7–7.7)
Neutrophils Relative %: 55 % (ref 43–77)
Platelets: 225 10*3/uL (ref 150–400)
RBC: 4.47 MIL/uL (ref 3.87–5.11)
RDW: 16.4 % — ABNORMAL HIGH (ref 11.5–15.5)
WBC: 5.4 10*3/uL (ref 4.0–10.5)

## 2014-05-20 MED ORDER — HYDROCHLOROTHIAZIDE 25 MG PO TABS
25.0000 mg | ORAL_TABLET | Freq: Every day | ORAL | Status: DC
Start: 2014-05-20 — End: 2015-04-01

## 2014-05-20 MED ORDER — HYDROMORPHONE HCL 2 MG PO TABS: 2.0000 mg | ORAL_TABLET | ORAL | Status: DC | PRN

## 2014-05-20 NOTE — ED Notes (Signed)
Stepped in a hole yesterday while mowing the grass. Pain in her left hip since.

## 2014-05-20 NOTE — Discharge Instructions (Signed)
Discuss changing your medicine from Lasix to hydrochlorothiazide with your obstetrician prior to the change.   Hypertension Hypertension, commonly called high blood pressure, is when the force of blood pumping through your arteries is too strong. Your arteries are the blood vessels that carry blood from your heart throughout your body. A blood pressure reading consists of a higher number over a lower number, such as 110/72. The higher number (systolic) is the pressure inside your arteries when your heart pumps. The lower number (diastolic) is the pressure inside your arteries when your heart relaxes. Ideally you want your blood pressure below 120/80. Hypertension forces your heart to work harder to pump blood. Your arteries may become narrow or stiff. Having hypertension puts you at risk for heart disease, stroke, and other problems.  RISK FACTORS Some risk factors for high blood pressure are controllable. Others are not.  Risk factors you cannot control include:   Race. You may be at higher risk if you are African American.  Age. Risk increases with age.  Gender. Men are at higher risk than women before age 37 years. After age 24, women are at higher risk than men. Risk factors you can control include:  Not getting enough exercise or physical activity.  Being overweight.  Getting too much fat, sugar, calories, or salt in your diet.  Drinking too much alcohol. SIGNS AND SYMPTOMS Hypertension does not usually cause signs or symptoms. Extremely high blood pressure (hypertensive crisis) may cause headache, anxiety, shortness of breath, and nosebleed. DIAGNOSIS  To check if you have hypertension, your health care provider will measure your blood pressure while you are seated, with your arm held at the level of your heart. It should be measured at least twice using the same arm. Certain conditions can cause a difference in blood pressure between your right and left arms. A blood pressure  reading that is higher than normal on one occasion does not mean that you need treatment. If one blood pressure reading is high, ask your health care provider about having it checked again. TREATMENT  Treating high blood pressure includes making lifestyle changes and possibly taking medicine. Living a healthy lifestyle can help lower high blood pressure. You may need to change some of your habits. Lifestyle changes may include:  Following the DASH diet. This diet is high in fruits, vegetables, and whole grains. It is low in salt, red meat, and added sugars.  Getting at least 2 hours of brisk physical activity every week.  Losing weight if necessary.  Not smoking.  Limiting alcoholic beverages.  Learning ways to reduce stress. If lifestyle changes are not enough to get your blood pressure under control, your health care provider may prescribe medicine. You may need to take more than one. Work closely with your health care provider to understand the risks and benefits. HOME CARE INSTRUCTIONS  Have your blood pressure rechecked as directed by your health care provider.   Take medicines only as directed by your health care provider. Follow the directions carefully. Blood pressure medicines must be taken as prescribed. The medicine does not work as well when you skip doses. Skipping doses also puts you at risk for problems.   Do not smoke.   Monitor your blood pressure at home as directed by your health care provider. SEEK MEDICAL CARE IF:   You think you are having a reaction to medicines taken.  You have recurrent headaches or feel dizzy.  You have swelling in your ankles.  You have  trouble with your vision. SEEK IMMEDIATE MEDICAL CARE IF:  You develop a severe headache or confusion.  You have unusual weakness, numbness, or feel faint.  You have severe chest or abdominal pain.  You vomit repeatedly.  You have trouble breathing. MAKE SURE YOU:   Understand these  instructions.  Will watch your condition.  Will get help right away if you are not doing well or get worse. Document Released: 09/06/2005 Document Revised: 01/21/2014 Document Reviewed: 06/29/2013 [facility] Patient Information 2015 [place], [place]. This information is not intended to replace advice given to you by your health care provider. Make sure you discuss any questions you have with your health care provider.  Hip Pain Your hip is the joint between your upper legs and your lower pelvis. The bones, cartilage, tendons, and muscles of your hip joint perform a lot of work each day supporting your body weight and allowing you to move around. Hip pain can range from a minor ache to severe pain in one or both of your hips. Pain may be felt on the inside of the hip joint near the groin, or the outside near the buttocks and upper thigh. You may have swelling or stiffness as well.  HOME CARE INSTRUCTIONS   Take medicines only as directed by your health care provider.  Apply ice to the injured area:  Put ice in a plastic bag.  Place a towel between your skin and the bag.  Leave the ice on for 15-20 minutes at a time, 3-4 times a day.  Keep your leg raised (elevated) when possible to lessen swelling.  Avoid activities that cause pain.  Follow specific exercises as directed by your health care provider.  Sleep with a pillow between your legs on your most comfortable side.  Record how often you have hip pain, the location of the pain, and what it feels like. SEEK MEDICAL CARE IF:   You are unable to put weight on your leg.  Your hip is red or swollen or very tender to touch.  Your pain or swelling continues or worsens after 1 week.  You have increasing difficulty walking.  You have a fever. SEEK IMMEDIATE MEDICAL CARE IF:   You have fallen.  You have a sudden increase in pain and swelling in your hip. MAKE SURE YOU:   Understand these instructions.  Will watch your  condition.  Will get help right away if you are not doing well or get worse. Document Released: 02/24/2010 Document Revised: 01/21/2014 Document Reviewed: 05/03/2013 [facility] Patient Information 2015 [place], [place]. This information is not intended to replace advice given to you by your health care provider. Make sure you discuss any questions you have with your health care provider.

## 2014-05-20 NOTE — ED Provider Notes (Signed)
CSN: [id number]     Arrival date & time 05/20/14  1545 History  This chart was scribed for Dot Lanes, MD, by Neta Ehlers, ED Scribe. This patient was seen in room MH08/MH08 and the patient's care was started at 4:20 PM.   First MD Initiated Contact with Patient 05/20/14 1617     Chief Complaint  Patient presents with  . Hip Pain    Patient is a 39 y.o. female presenting with hip pain. The history is provided by the patient. No language interpreter was used.  Hip Pain   HPI Comments: Ashley [name] is a 38 y.o. female who presents to the Emergency Department complaining of worsening pain to her left hip which began yesterday. She reports she stepped in a depression in her yard yesterday and the hip felt as if she "jammed it." She denies falling or landing upon the hip. She reports the pain is increased with ambulation.   Ashley [name] also reports she has been placed on Lasix following the birth of her daughter due to increased lower extremity swelling; she is three weeks post-partum. She also reports when her home health nurse visited her last week her BP was elevated. She denies HTN or lower extremity swelling prior to the pregnancy. In the ED her BP is 164/111.   Past Medical History  Diagnosis Date  . PCOS (polycystic ovarian syndrome)   . Endometrial polyp   . Infertility   . Miscarriage 03/2011  . Back pain   . DVT (deep venous thrombosis) 2012    left leg  . AMA (advanced maternal age) multigravida 61+   . HSV (herpes simplex virus) anogenital infection   . Headache(784.0)   . Chronic pain   . Depression     good now   Past Surgical History  Procedure Laterality Date  . Knee surgery      X4-5  . Ankle surgery      X2  . 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 Hysteroscopy  2011    HYSTEROSCOPY, D&C  . Dilation and evacuation  04/02/2011    Procedure: DILATATION AND EVACUATION (D&E);  Surgeon: Luz Lex, MD;  Location: [facility];   Service: Gynecology;  Laterality: N/A;  dvt left mid thigh  . Dilation and curettage of uterus      poloyps  . Foot surgery      plantar fasciitis  . Shoulder arthroscopy Right    Family History  Problem Relation Age of Onset  . Diabetes Father   . Cancer Father     COLON  . Heart disease Father   . Kidney disease Father     dialysis  . Diabetes Sister   . Cancer Maternal Grandmother     UTERINE  . Cancer Paternal Grandmother     breast   History  Substance Use Topics  . Smoking status: Never Smoker   . Smokeless tobacco: Never Used  . Alcohol Use: No   OB History   Grav Para Term Preterm Abortions TAB SAB Ect Mult Living   4 2 1 1 2 1 1   2      Review of Systems  Cardiovascular: Positive for leg swelling.  Musculoskeletal: Positive for arthralgias.  All other systems reviewed and are negative.   Allergies  Codeine and Cymbalta  Home Medications   Prior to Admission medications   Medication Sig Start Date End Date Taking? Authorizing Provider  enoxaparin (LOVENOX) 40 MG/0.4ML injection  Inject 0.4 mLs (40 mg total) into the skin daily. 05/01/14   Damien Fusi, NP  ferrous sulfate 325 (65 FE) MG tablet Take 325 mg by mouth daily with breakfast.    Historical Provider, MD  hydrochlorothiazide (HYDRODIURIL) 25 MG tablet Take 1 tablet (25 mg total) by mouth daily. 05/20/14   Dot Lanes, MD  HYDROmorphone (DILAUDID) 2 MG tablet Take 1 tablet (2 mg total) by mouth every 4 (four) hours as needed for severe pain. 05/01/14   Damien Fusi, NP  Prenatal Vit-Fe Fumarate-FA (PRENATAL MULTIVITAMIN) TABS tablet Take 1 tablet by mouth every morning.     Historical Provider, MD  valACYclovir (VALTREX) 500 MG tablet Take 500 mg by mouth 2 (two) times daily.    Historical Provider, MD   Triage Vitals: BP 164/111  Pulse 88  Temp(Src) 98.8 F (37.1 C) (Oral)  Resp 20  Ht 6' (1.829 m)  Wt 298 lb (135.172 kg)  BMI 40.41 kg/m2  SpO2 100%  Physical Exam Physical Exam   Nursing note and vitals reviewed. Constitutional: She is oriented to person, place, and time. She appears well-developed and well-nourished. No distress.  HENT:  Head: Normocephalic and atraumatic.  Eyes: Pupils are equal, round, and reactive to light.  Neck: Normal range of motion.  Cardiovascular: Normal rate and intact distal pulses.   Pulmonary/Chest: No respiratory distress.  Abdominal: Normal appearance. She exhibits no distension.  Musculoskeletal: Normal range of motion.  some tenderness to flexion and extension at the hip.  Patient able to ambulate with no significant limb.  No bony tenderness to palpation. Neurological: She is alert and oriented to person, place, and time. No cranial nerve deficit.  Skin: Skin is warm and dry. No rash noted.  Psychiatric: She has a normal mood and affect. Her behavior is normal.   ED Course  Procedures (including critical care time)  DIAGNOSTIC STUDIES: Oxygen Saturation is 100% on room air, normal by my interpretation.    COORDINATION OF CARE:  4:26 PM- Discussed treatment plan with patient, and the patient agreed to the plan.   Labs Review Labs Reviewed  CBC WITH DIFFERENTIAL - Abnormal; Notable for the following:    Hemoglobin 11.1 (*)    HCT 35.0 (*)    MCH 24.8 (*)    RDW 16.4 (*)    Basophils Relative 2 (*)    All other components within normal limits  BASIC METABOLIC PANEL    Imaging Review Dg Hip Complete Left  05/20/2014   CLINICAL DATA:  Twisted LEFT leg while mowing the yard yesterday, now with LEFT hip pain at groin area  EXAM: LEFT HIP - COMPLETE 2+ VIEW  COMPARISON:  05/22/2007  FINDINGS: Osseous mineralization normal.  Joint spaces preserved.  No acute fracture, dislocation or bone destruction.  IMPRESSION: Normal exam.  No significant interval change.   Electronically Signed   By: Lavonia Dana M.D.   On: 05/20/2014 16:15     EKG Interpretation None     Patient's blood pressure is stabilized with mean arterial  pressure now down to 98.  Plan at this time is to DC the Lasix and switch her to hydrochlorothiazide.  She will discuss this with her OB team doctor tomorrow and she was encouraged to have close followup for blood pressure. MDM   Final diagnoses:  Hip strain, left, initial encounter  Secondary hypertension, unspecified      I personally performed the services described in this documentation, which was scribed in my  presence. The recorded information has been reviewed and considered.   Dot Lanes, MD 05/20/14 [phone]

## 2014-06-03 ENCOUNTER — Ambulatory Visit (HOSPITAL_COMMUNITY)
Admission: RE | Admit: 2014-06-03 | Discharge: 2014-06-03 | Disposition: A | Discharge status: Home / Self Care | Payer: BC Managed Care – PPO | Source: Ambulatory Visit | Attending: Obstetrics and Gynecology | Admitting: Obstetrics and Gynecology

## 2014-06-03 NOTE — Lactation Note (Signed)
Lactation Consult  Mother's reason for visit:  Latch baby.  Geronimo Running been receiving formula in a bottle.  Mom is pumping 1 ounce  3 times a day and feeds that back to [place] as well.  Mom has been having difficulty with her supply which she attributes to [facility].  She is still having difficulty controlling her BP.  SHe is currently on HCTZ.  Today baby latched easily but snapback was heard often.  She transfered 5 ml on the left and 11 on the right.  I had mom post pump and she pumped another 15 ml.  I encouraged her to put the baby to the breast to help with her milk supply.  She will continue to feed her formula after BF.  I also educated her on supply and demand and encouraged her to increase pumping to every 2-3 hours during the day and at least once overnight.  I also don't want mom to get overwhelmed because she still gets HA related to her BP.  She will do what she can.  She may start mother love-special blend. Follow-up prn. Visit Type:  OP Appointment Notes:   Consult:   Lactation Consultant:  Van Clines  ________________________________________________________________________  Ashley [name] Name: Ashley [name]  Date of Birth: 04/29/2015  Pediatrician: Joneen Caraway  Gender: female  Gestational Age: [redacted]w[redacted]d (At Birth)  Birth Weight: 10 lb 9.2 oz (4797 g)  Weight at Discharge: Weight: 10 lb 4 oz (4650 g) Date of Discharge: 05/02/2014  [facility] Weights   04/29/14 2226 05/01/14 0030 05/01/14 2357  Weight: 10 lb 9.2 oz (4797 g) 10 lb 8 oz (4763 g) 10 lb 4 oz (4650 g)   Weight today: 5622 12+6.3 oz Weight after feeding 5638   ________________________________________________________________________  Mother's Name: Ashley [name]

## 2014-06-11 ENCOUNTER — Other Ambulatory Visit: Payer: Self-pay | Admitting: Orthopedic Surgery

## 2014-06-11 ENCOUNTER — Other Ambulatory Visit: Payer: Self-pay | Admitting: Obstetrics and Gynecology

## 2014-06-11 DIAGNOSIS — M25552 Pain in left hip: Secondary | ICD-10-CM

## 2014-06-12 LAB — CYTOLOGY - PAP

## 2014-06-20 ENCOUNTER — Ambulatory Visit
Admission: RE | Admit: 2014-06-20 | Discharge: 2014-06-20 | Disposition: A | Discharge status: Home / Self Care | Payer: BC Managed Care – PPO | Source: Ambulatory Visit | Attending: Orthopedic Surgery | Admitting: Orthopedic Surgery

## 2014-06-20 DIAGNOSIS — M25552 Pain in left hip: Secondary | ICD-10-CM

## 2014-06-26 ENCOUNTER — Other Ambulatory Visit: Payer: Self-pay | Admitting: Physician Assistant

## 2014-06-27 ENCOUNTER — Encounter (HOSPITAL_BASED_OUTPATIENT_CLINIC_OR_DEPARTMENT_OTHER): Payer: Self-pay | Admitting: *Deleted

## 2014-06-28 ENCOUNTER — Encounter (HOSPITAL_BASED_OUTPATIENT_CLINIC_OR_DEPARTMENT_OTHER): Payer: Self-pay | Admitting: *Deleted

## 2014-06-28 NOTE — Progress Notes (Signed)
Discussed with Dr. Massage about pt's history of DVT - [place] for surgery, no extra labs ordered. Coming next week for BMET and EKG.Bring all medications.

## 2014-07-03 NOTE — H&P (Signed)
[facility] [address] [place], [place] [address] [phone] A Division of [facility]  Ninetta Lights, M.D.   Robert A. Noemi Chapel, M.D.   Faythe Casa, M.D.   Johnny Bridge, M.D.   Almedia Balls, M.D Ernesta Amble. Percell Miller, M.D.  Joseph Pierini, M.D.  Lanier Prude, M.D.    Verner Chol, M.D. Mary L. Fenton Malling, PA-C  Kirstin A. Shepperson, PA-C  Josh [place], PA-C [place], [place]   RE: [place], [place]                                [phone]      DOB: [birth year] PROGRESS NOTE: 06-11-14 Juri comes in earlier than scheduled.  This is due to persistent symptoms that she would really like to get to the bottom of in regards to diagnosis and treatment.  A number of issues well outlined in the note from May 21, 2014.  Current issues as follows:   1. Persistent left hip symptoms.  A lot of this is lateral, but she also has groin pain.  This is after she stepped in a hole.  She has had an injury to this hip back in 2009 with negative x-rays and negative MRI that improved until this recent event.  She does have lumbar issues with some occasional radicular symptoms, both legs, but this appears to be different.  On exam she has pain with log roll.  Exquisitely tender over the greater trochanter.  We are going to proceed with an MRI scan to look at structures of her left hip.  Previous x-rays have been negative.  She is going to follow up with me when that is complete, tailoring further treatment.  I have talked about a diagnostic/therapeutic injection into her bursa, but we are going to wait on her scan first.  This is being obtained because of her groin pain.   2. Persistent worsening carpal tunnel symptoms, right greater than left.  She feels like this is getting worse.  Injection for her de Quervain's helpful for that, but the numbness and tingling is driving her crazy.  In that regard, we are going to  go ahead and proceed with EMG nerve conduction studies looking at carpal tunnel on both sides.  Follow up with me when that is complete, tailoring further treatment depending on what that shows.   3. Left thumb is improved after the injection of her de Quervain's there.  This is not completely gone, but it has calmed it down a lot.  She is going to continue using her splint and I am not planning on doing anything further, just waiting this out.  All of this thoroughly reviewed with her.  She will follow up with me after the MRI and the EMGs are complete.   Ninetta Lights, M.D.   Electronically verified by Ninetta Lights, M.D. DFM:jjh D 06-11-14 T 06-12-14 [facility] [address] [place], [place] [address] [phone] A Division of [facility]  Ninetta Lights, M.D.   Robert A. Noemi Chapel, M.D.   Faythe Casa, M.D.   Johnny Bridge, M.D.   Almedia Balls, M.D Ernesta Amble. Percell Miller, M.D.  Joseph Pierini, M.D.  Lanier Prude, M.D.    Verner Chol, M.D. Mary L. Fenton Malling, PA-C  Kirstin A. Shepperson, PA-C  [address] [place], PA-C [place], OPA-C   RE: [place], [place]   [phone]      DOB: 05/14/1976 PROGRESS NOTE: 06-25-14 Noemi comes in for follow-up. We completed studies. 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 There is a little bit of pelvic fluid that's physiologic. I'm not seeing evidence of AVN or marked degenerative change.  She continues to have significant symptoms in the groin centered around her left hip. We discussed options. She has responded well in the past to intraarticular diagnostic/therapeutic injection. We're going to repeat that now. I'm not seeing profound changes but if there are symptoms coming from synovitis and injections helped in the past we'll see whether that's  helpful now. We arranged that and she will let me know how she does after that's complete. The next issue is bilateral carpal tunnel syndrome with persistent de Quervain's tenosynovitis on the left. EMG nerve conduction studies have been complete. 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 Consistent with her symptoms. I reviewed that with her and discussed options. I told her we can continue to wait this out because she is postpartum. She feels like she's getting worse rather than better. We discussed definitive treatment with carpal tunnel release. She would like to proceed with that after I had a thorough discussion of options. The left side first. Carpal tunnel release as well as release of the first dorsal compartment for residual de Quervain's tenosynovitis. Once sufficiently recovered we will proceed with carpal tunnel release on the right. Estimated time out of work 3 weeks for the left 3 more weeks for the right  Arrangements made for that. Exact timing on when we do the right is depending on how she does on the left and she understands. Paperwork complete all questions answered.  Ninetta Lights, M.D.  Electronically verified by Ninetta Lights, M.D. DFM:kah D 06-26-14 T 06-27-14

## 2014-07-04 ENCOUNTER — Ambulatory Visit (HOSPITAL_BASED_OUTPATIENT_CLINIC_OR_DEPARTMENT_OTHER)
Admission: RE | Admit: 2014-07-04 | Discharge: 2014-07-04 | Disposition: A | Discharge status: Home / Self Care | Payer: BC Managed Care – PPO | Source: Ambulatory Visit | Attending: Orthopedic Surgery | Admitting: Orthopedic Surgery

## 2014-07-04 ENCOUNTER — Encounter (HOSPITAL_BASED_OUTPATIENT_CLINIC_OR_DEPARTMENT_OTHER): Payer: BC Managed Care – PPO | Admitting: Anesthesiology

## 2014-07-04 ENCOUNTER — Encounter (HOSPITAL_BASED_OUTPATIENT_CLINIC_OR_DEPARTMENT_OTHER)
Admission: RE | Disposition: A | Discharge status: Home / Self Care | Payer: Self-pay | Source: Ambulatory Visit | Attending: Orthopedic Surgery

## 2014-07-04 ENCOUNTER — Other Ambulatory Visit: Payer: Self-pay

## 2014-07-04 ENCOUNTER — Encounter (HOSPITAL_BASED_OUTPATIENT_CLINIC_OR_DEPARTMENT_OTHER): Payer: Self-pay | Admitting: Anesthesiology

## 2014-07-04 ENCOUNTER — Ambulatory Visit (HOSPITAL_BASED_OUTPATIENT_CLINIC_OR_DEPARTMENT_OTHER): Payer: BC Managed Care – PPO | Admitting: Anesthesiology

## 2014-07-04 DIAGNOSIS — M654 Radial styloid tenosynovitis [de Quervain]: Secondary | ICD-10-CM | POA: Insufficient documentation

## 2014-07-04 DIAGNOSIS — G5602 Carpal tunnel syndrome, left upper limb: Secondary | ICD-10-CM | POA: Insufficient documentation

## 2014-07-04 HISTORY — PX: DORSAL COMPARTMENT RELEASE: SHX5039

## 2014-07-04 HISTORY — DX: Unspecified osteoarthritis, unspecified site: M19.90

## 2014-07-04 HISTORY — PX: CARPAL TUNNEL RELEASE: SHX101

## 2014-07-04 LAB — POCT I-STAT, CHEM 8
BUN: 20 mg/dL (ref 6–23)
Calcium, Ion: 1.06 mmol/L — ABNORMAL LOW (ref 1.12–1.23)
Chloride: 103 mEq/L (ref 96–112)
Creatinine, Ser: 1 mg/dL (ref 0.50–1.10)
Glucose, Bld: 92 mg/dL (ref 70–99)
HCT: 43 % (ref 36.0–46.0)
Hemoglobin: 14.6 g/dL (ref 12.0–15.0)
Potassium: 3.7 mEq/L (ref 3.7–5.3)
Sodium: 139 mEq/L (ref 137–147)
TCO2: 25 mmol/L (ref 0–100)

## 2014-07-04 SURGERY — CARPAL TUNNEL RELEASE
Anesthesia: General | Site: Wrist | Laterality: Left

## 2014-07-04 MED ORDER — OXYCODONE HCL 5 MG/5ML PO SOLN: 5.0000 mg | Freq: Once | ORAL | Status: AC | PRN

## 2014-07-04 MED ORDER — MIDAZOLAM HCL 2 MG/2ML IJ SOLN
INTRAMUSCULAR | Status: AC
  Filled 2014-07-04: qty 2

## 2014-07-04 MED ORDER — PROPOFOL 10 MG/ML IV BOLUS
INTRAVENOUS | Status: DC | PRN
  Administered 2014-07-04: 300 mg via INTRAVENOUS

## 2014-07-04 MED ORDER — DEXAMETHASONE SODIUM PHOSPHATE 4 MG/ML IJ SOLN
INTRAMUSCULAR | Status: DC | PRN
  Administered 2014-07-04: 10 mg via INTRAVENOUS

## 2014-07-04 MED ORDER — LACTATED RINGERS IV SOLN
INTRAVENOUS | Status: DC
  Administered 2014-07-04 (×2): via INTRAVENOUS

## 2014-07-04 MED ORDER — LACTATED RINGERS IV SOLN: INTRAVENOUS | Status: DC

## 2014-07-04 MED ORDER — HYDROMORPHONE HCL 1 MG/ML IJ SOLN
INTRAMUSCULAR | Status: AC
Start: 2014-07-04 — End: 2014-07-04
  Filled 2014-07-04: qty 1

## 2014-07-04 MED ORDER — OXYCODONE HCL 5 MG PO TABS
ORAL_TABLET | ORAL | Status: AC
  Filled 2014-07-04: qty 1

## 2014-07-04 MED ORDER — FENTANYL CITRATE 0.05 MG/ML IJ SOLN
INTRAMUSCULAR | Status: AC
  Filled 2014-07-04: qty 4

## 2014-07-04 MED ORDER — ONDANSETRON HCL 4 MG/2ML IJ SOLN
INTRAMUSCULAR | Status: DC | PRN
  Administered 2014-07-04: 4 mg via INTRAVENOUS

## 2014-07-04 MED ORDER — LIDOCAINE HCL (CARDIAC) 20 MG/ML IV SOLN
INTRAVENOUS | Status: DC | PRN
  Administered 2014-07-04: 100 mg via INTRAVENOUS

## 2014-07-04 MED ORDER — HYDROCODONE-ACETAMINOPHEN 7.5-325 MG PO TABS: 1.0000 | ORAL_TABLET | ORAL | Status: DC | PRN

## 2014-07-04 MED ORDER — OXYCODONE HCL 5 MG PO TABS
5.0000 mg | ORAL_TABLET | Freq: Once | ORAL | Status: AC | PRN
  Administered 2014-07-04: 5 mg via ORAL

## 2014-07-04 MED ORDER — ONDANSETRON HCL 4 MG/2ML IJ SOLN
4.0000 mg | Freq: Once | INTRAMUSCULAR | Status: DC | PRN
Start: 2014-07-04 — End: 2014-07-04

## 2014-07-04 MED ORDER — CEFAZOLIN SODIUM-DEXTROSE 2-3 GM-% IV SOLR
INTRAVENOUS | Status: DC | PRN
  Administered 2014-07-04: 2 g via INTRAVENOUS

## 2014-07-04 MED ORDER — HYDROMORPHONE HCL 1 MG/ML IJ SOLN
0.2500 mg | INTRAMUSCULAR | Status: DC | PRN
  Administered 2014-07-04 (×2): 0.5 mg via INTRAVENOUS

## 2014-07-04 MED ORDER — SCOPOLAMINE 1 MG/3DAYS TD PT72
MEDICATED_PATCH | TRANSDERMAL | Status: AC
  Filled 2014-07-04: qty 1

## 2014-07-04 MED ORDER — MIDAZOLAM HCL 2 MG/2ML IJ SOLN: 1.0000 mg | INTRAMUSCULAR | Status: DC | PRN

## 2014-07-04 MED ORDER — DEXTROSE 5 % IV SOLN: 3.0000 g | INTRAVENOUS | Status: DC

## 2014-07-04 MED ORDER — PROPOFOL 10 MG/ML IV BOLUS
INTRAVENOUS | Status: AC
  Filled 2014-07-04: qty 20

## 2014-07-04 MED ORDER — FENTANYL CITRATE 0.05 MG/ML IJ SOLN: 50.0000 ug | INTRAMUSCULAR | Status: DC | PRN

## 2014-07-04 MED ORDER — MIDAZOLAM HCL 5 MG/5ML IJ SOLN
INTRAMUSCULAR | Status: DC | PRN
  Administered 2014-07-04: 2 mg via INTRAVENOUS

## 2014-07-04 MED ORDER — BUPIVACAINE HCL (PF) 0.5 % IJ SOLN
INTRAMUSCULAR | Status: AC
  Filled 2014-07-04: qty 30

## 2014-07-04 MED ORDER — BUPIVACAINE HCL (PF) 0.25 % IJ SOLN
INTRAMUSCULAR | Status: AC
  Filled 2014-07-04: qty 30

## 2014-07-04 MED ORDER — FENTANYL CITRATE 0.05 MG/ML IJ SOLN
INTRAMUSCULAR | Status: DC | PRN
  Administered 2014-07-04 (×2): 50 ug via INTRAVENOUS

## 2014-07-04 MED ORDER — ONDANSETRON HCL 4 MG PO TABS: 4.0000 mg | ORAL_TABLET | Freq: Three times a day (TID) | ORAL | Status: DC | PRN

## 2014-07-04 MED ORDER — CHLORHEXIDINE GLUCONATE 4 % EX LIQD: 60.0000 mL | Freq: Once | CUTANEOUS | Status: DC

## 2014-07-04 MED ORDER — BUPIVACAINE HCL (PF) 0.5 % IJ SOLN
INTRAMUSCULAR | Status: DC | PRN
  Administered 2014-07-04: 4 mL

## 2014-07-04 MED ORDER — KETOROLAC TROMETHAMINE 30 MG/ML IJ SOLN
INTRAMUSCULAR | Status: DC | PRN
  Administered 2014-07-04: 30 mg via INTRAVENOUS

## 2014-07-04 SURGICAL SUPPLY — 45 items
BANDAGE ELASTIC 3 VELCRO ST LF (GAUZE/BANDAGES/DRESSINGS) ×2 IMPLANT
BLADE SURG 15 STRL LF DISP TIS (BLADE) ×1 IMPLANT
BLADE SURG 15 STRL SS (BLADE) ×1
BNDG COHESIVE 3X5 TAN STRL LF (GAUZE/BANDAGES/DRESSINGS) ×2 IMPLANT
BNDG ESMARK 4X9 LF (GAUZE/BANDAGES/DRESSINGS) ×2 IMPLANT
BNDG PLASTER X FAST 2X3 WHT LF (CAST SUPPLIES) ×2 IMPLANT
CORDS BIPOLAR (ELECTRODE) ×2 IMPLANT
COVER BACK TABLE 60X90IN (DRAPES) ×2 IMPLANT
COVER MAYO STAND STRL (DRAPES) ×2 IMPLANT
CUFF TOURNIQUET SINGLE 18IN (TOURNIQUET CUFF) IMPLANT
CUFF TOURNIQUET SINGLE 24IN (TOURNIQUET CUFF) ×2 IMPLANT
DRAPE EXTREMITY TIBURON (DRAPES) ×2 IMPLANT
DRAPE SURG 17X23 STRL (DRAPES) ×2 IMPLANT
DRSG PAD ABDOMINAL 8X10 ST (GAUZE/BANDAGES/DRESSINGS) ×2 IMPLANT
DURAPREP 26ML APPLICATOR (WOUND CARE) ×2 IMPLANT
GAUZE SPONGE 4X4 12PLY STRL (GAUZE/BANDAGES/DRESSINGS) ×2 IMPLANT
GAUZE XEROFORM 1X8 LF (GAUZE/BANDAGES/DRESSINGS) ×2 IMPLANT
GLOVE BIOGEL PI IND STRL 7.0 (GLOVE) ×2 IMPLANT
GLOVE BIOGEL PI INDICATOR 7.0 (GLOVE) ×2
GLOVE ECLIPSE 6.5 STRL STRAW (GLOVE) ×4 IMPLANT
GLOVE EXAM NITRILE LRG STRL (GLOVE) ×2 IMPLANT
GLOVE ORTHO TXT STRL SZ7.5 (GLOVE) ×2 IMPLANT
GOWN STRL REUS W/ TWL LRG LVL3 (GOWN DISPOSABLE) ×2 IMPLANT
GOWN STRL REUS W/ TWL XL LVL3 (GOWN DISPOSABLE) ×1 IMPLANT
GOWN STRL REUS W/TWL LRG LVL3 (GOWN DISPOSABLE) ×2
GOWN STRL REUS W/TWL XL LVL3 (GOWN DISPOSABLE) ×1
NEEDLE HYPO 25X1 1.5 SAFETY (NEEDLE) ×2 IMPLANT
NS IRRIG 1000ML POUR BTL (IV SOLUTION) ×2 IMPLANT
PACK BASIN DAY SURGERY FS (CUSTOM PROCEDURE TRAY) ×2 IMPLANT
PAD CAST 3X4 CTTN HI CHSV (CAST SUPPLIES) ×1 IMPLANT
PADDING CAST ABS 3INX4YD NS (CAST SUPPLIES) ×1
PADDING CAST ABS 4INX4YD NS (CAST SUPPLIES)
PADDING CAST ABS COTTON 3X4 (CAST SUPPLIES) ×1 IMPLANT
PADDING CAST ABS COTTON 4X4 ST (CAST SUPPLIES) IMPLANT
PADDING CAST COTTON 3X4 STRL (CAST SUPPLIES) ×1
PADDING UNDERCAST 2 STRL (CAST SUPPLIES) ×1
PADDING UNDERCAST 2X4 STRL (CAST SUPPLIES) ×1 IMPLANT
SPLINT PLASTER CAST XFAST 3X15 (CAST SUPPLIES) ×5 IMPLANT
SPLINT PLASTER XTRA FASTSET 3X (CAST SUPPLIES) ×5
STOCKINETTE 4X48 STRL (DRAPES) ×2 IMPLANT
SUT ETHILON 3 0 PS 1 (SUTURE) ×2 IMPLANT
SYR BULB 3OZ (MISCELLANEOUS) ×2 IMPLANT
SYR CONTROL 10ML LL (SYRINGE) ×2 IMPLANT
TOWEL OR 17X24 6PK STRL BLUE (TOWEL DISPOSABLE) ×2 IMPLANT
UNDERPAD 30X30 INCONTINENT (UNDERPADS AND DIAPERS) IMPLANT

## 2014-07-04 NOTE — Discharge Instructions (Signed)
Carpal Tunnel & De Quervain's Release , Care After Refer to this sheet in the next few weeks. These discharge instructions provide you with general information on caring for yourself after you leave the hospital. Your caregiver may also give you specific instructions. Your treatment has been planned according to the most current medical practices available, but unavoidable complications sometimes occur. If you have any problems or questions after discharge, please call your caregiver. HOME CARE INSTRUCTIONS   Do not remove splint until seen in the office.  May shower in 3 days but do not soak incision.  Follow up appointment in one week.  Have a responsible person with you for 24 hours.  Do not drive a car or take public transportation for 24 hours.  Only take over-the-counter or prescription medicines for pain, discomfort, or fever as directed by your caregiver. Take them as directed.  You may put ice on the palm side of the affected wrist.  Put ice in a plastic bag.  Place a towel between your skin and the bag.  Leave the ice on for 15-20 minutes, 03-04 times per day.  If you were given a splint to keep your wrist from bending, use it as directed. It is important to wear the splint at night or as directed. Use the splint for as long as you have pain or numbness in your hand, arm, or wrist. This may take 1 to 2 months.  Keep your hand raised (elevated) above the level of your heart as much as possible. This keeps swelling down and helps with discomfort.  Change bandages (dressings) as directed.  Keep the wound clean and dry. SEEK MEDICAL CARE IF:   You develop pain not relieved with medicines.  You develop numbness of your hand.  You develop bleeding from your surgical site.  You have an oral temperature above 102 F (38.9 C).  You develop redness or swelling of the surgical site.  You develop new, unexplained problems. SEEK IMMEDIATE MEDICAL CARE IF:   You develop a  rash.  You have difficulty breathing.  You develop any reaction or side effects to medicines given. MAKE SURE YOU:   Understand these instructions.  Will watch your condition.  Will get help right away if you are not doing well or get worse. Document Released: 03/26/2005 Document Revised: 06/27/2013 Document Reviewed: 07/13/2007 [facility] Patient Information 2015 [place], [place]. This information is not intended to replace advice given to you by your health care provider. Make sure you discuss any questions you have with your health care provider.   Post Anesthesia Home Care Instructions  Activity: Get plenty of rest for the remainder of the day. A responsible adult should stay with you for 24 hours following the procedure.  For the next 24 hours, DO NOT: -Drive a car -Paediatric nurse -Drink alcoholic beverages -Take any medication unless instructed by your physician -Make any legal decisions or sign important papers.  Meals: Start with liquid foods such as gelatin or soup. Progress to regular foods as tolerated. Avoid greasy, spicy, heavy foods. If nausea and/or vomiting occur, drink only clear liquids until the nausea and/or vomiting subsides. Call your physician if vomiting continues.  Special Instructions/Symptoms: Your throat may feel dry or sore from the anesthesia or the breathing tube placed in your throat during surgery. If this causes discomfort, gargle with warm salt water. The discomfort should disappear within 24 hours.

## 2014-07-04 NOTE — Anesthesia Preprocedure Evaluation (Signed)
Anesthesia Evaluation  Patient identified by MRN, date of birth, ID band Patient awake    Reviewed: Allergy & Precautions, H&P , NPO status , Patient's Chart, lab work & pertinent test results, reviewed documented beta blocker date and time   Airway Mallampati: I TM Distance: >3 FB Neck ROM: Full    Dental  (+) Teeth Intact, Dental Advisory Given   Pulmonary  breath sounds clear to auscultation        Cardiovascular hypertension, Pt. on medications and Pt. on home beta blockers Rhythm:Regular Rate:Normal     Neuro/Psych    GI/Hepatic   Endo/Other    Renal/GU      Musculoskeletal   Abdominal   Peds  Hematology   Anesthesia Other Findings   Reproductive/Obstetrics                           Anesthesia Physical Anesthesia Plan  ASA: II  Anesthesia Plan: General   Post-op Pain Management:    Induction: Intravenous  Airway Management Planned: LMA  Additional Equipment:   Intra-op Plan:   Post-operative Plan: Extubation in OR  Informed Consent: I have reviewed the patients History and Physical, chart, labs and discussed the procedure including the risks, benefits and alternatives for the proposed anesthesia with the patient or authorized representative who has indicated his/her understanding and acceptance.   Dental advisory given  Plan Discussed with: CRNA, Anesthesiologist and Surgeon  Anesthesia Plan Comments:         Anesthesia Quick Evaluation

## 2014-07-04 NOTE — Op Note (Signed)
[name]  Ashley, [name] NO.:  [id number]  MEDICAL RECORD NO.:  [id number]  LOCATION:                                 FACILITY:  PHYSICIAN:  Ninetta Lights, M.D. DATE OF BIRTH:  [birth year]  DATE OF PROCEDURE:  07/04/2014 DATE OF DISCHARGE:  07/04/2014                              OPERATIVE REPORT   PREOPERATIVE DIAGNOSES: 1. Left carpal tunnel syndrome. 2. De Quervain's tenosynovitis first dorsal compartment, left wrist.  POSTOPERATIVE DIAGNOSES: 1. Left carpal tunnel syndrome. 2. De Quervain's tenosynovitis first dorsal compartment, left wrist.  PROCEDURE:  Left carpal tunnel release.  Release of first dorsal compartment, left wrist.  SURGEON:  Ninetta Lights, M.D.  ASSISTANT:  Doran Stabler, PA  ANESTHESIA:  General.  BLOOD LOSS:  Minimal.  SPECIMENS:  None.  CULTURES:  None.  COMPLICATIONS:  None.  DRESSINGS:  Soft compressive with a bulky hand dressing and splint.  TOURNIQUET TIME:  45 minutes.  DESCRIPTION OF PROCEDURE:  The patient was brought to the operating room, placed on the operating table in supine position.  After adequate anesthesia had been obtained, tourniquet applied.  Prepped and draped in usual sterile fashion.  Exsanguinated with elevation of Esmarch. Tourniquet inflated to 250 mmHg.  A small longitudinal incision over the carpal tunnel.  Skin and subcutaneous tissue divided.  Retinaculum exposed and incised under direct visualization from the forearm fascia proximally, the palmar arch distally.  Moderate constriction with hypertrophic synovitis debrided.  Median nerve identified.  Digital branch, motor branch identified, protected, decompressed.  Wound irrigated, closed with nylon.  I then made a small transverse incision over the first dorsal compartment.  Skin and subcutaneous tissue divided.  Care taken to avoid superficial branch of radial nerve. Markedly thickened retinaculum over the first dorsal compartment  was incised and partially excised.  Three separate tendons, 3 separate compartments, all completely decompressed throughout.  A little attrition on the tendons but functionally intact.  After I had a nice confirmation of good release, wound was irrigated and closed with nylon. Margins were injected with Marcaine.  Sterile compressive dressing applied.  Bulky hand dressing was applied.  Tourniquet deflated and removed.  Anesthesia reversed.  Brought to the recovery room.  Tolerated the surgery well.  No complications.     Ninetta Lights, M.D.     DFM/MEDQ  D:  07/04/2014  T:  07/04/2014  Job:  [id number]

## 2014-07-04 NOTE — Anesthesia Procedure Notes (Signed)
Procedure Name: LMA Insertion Date/Time: 07/04/2014 10:07 AM Performed by: Toula Moos L Pre-anesthesia Checklist: Patient identified, Emergency Drugs available, Suction available, Patient being monitored and Timeout performed Patient Re-evaluated:Patient Re-evaluated prior to inductionOxygen Delivery Method: Circle System Utilized Preoxygenation: Pre-oxygenation with 100% oxygen Intubation Type: IV induction Ventilation: Mask ventilation without difficulty LMA: LMA inserted LMA Size: 4.0 Number of attempts: 1 Airway Equipment and Method: bite block Placement Confirmation: positive ETCO2 Tube secured with: Tape Dental Injury: Teeth and Oropharynx as per pre-operative assessment

## 2014-07-04 NOTE — Anesthesia Postprocedure Evaluation (Signed)
  Anesthesia Post-op Note  Patient: Ashley [name]  Procedure(s) Performed: Procedure(s): LEFT CARPAL TUNNEL RELEASE (Left) LEFT DEQUERVAINS (Left)  Patient Location: PACU  Anesthesia Type: General   Level of Consciousness: awake, alert  and oriented  Airway and Oxygen Therapy: Patient Spontanous Breathing  Post-op Pain: mild  Post-op Assessment: Post-op Vital signs reviewed  Post-op Vital Signs: Reviewed  Last Vitals:  Filed Vitals:   07/04/14 1330  BP: 135/76  Pulse: 83  Temp: 36.4 C  Resp: 16    Complications: No apparent anesthesia complications

## 2014-07-04 NOTE — Interval H&P Note (Signed)
History and Physical Interval Note:  07/04/2014 7:33 AM  Ashley [name]  has presented today for surgery, with the diagnosis of CARPAL TUNNEL SYNDROME LEFT UPPER LIMB/RADIAL STYLOID TENOSYNOVITIS (DE QUERVAIN)  The various methods of treatment have been discussed with the patient and family. After consideration of risks, benefits and other options for treatment, the patient has consented to  Procedure(s): LEFT CARPAL TUNNEL RELEASE (Left) LEFT DEQUERVAINS (Left) as a surgical intervention .  The patient's history has been reviewed, patient examined, no change in status, stable for surgery.  I have reviewed the patient's chart and labs.  Questions were answered to the patient's satisfaction.     [name] F

## 2014-07-04 NOTE — Transfer of Care (Signed)
Immediate Anesthesia Transfer of Care Note  Patient: Ashley [name]  Procedure(s) Performed: Procedure(s): LEFT CARPAL TUNNEL RELEASE (Left) LEFT DEQUERVAINS (Left)  Patient Location: PACU  Anesthesia Type:General  Level of Consciousness: sedated and patient cooperative  Airway & Oxygen Therapy: Patient Spontanous Breathing and Patient connected to face mask oxygen  Post-op Assessment: Report given to PACU RN and Post -op Vital signs reviewed and stable  Post vital signs: Reviewed and stable  Complications: No apparent anesthesia complications

## 2014-07-08 ENCOUNTER — Encounter (HOSPITAL_BASED_OUTPATIENT_CLINIC_OR_DEPARTMENT_OTHER): Payer: Self-pay | Admitting: Orthopedic Surgery

## 2014-07-21 DIAGNOSIS — G5601 Carpal tunnel syndrome, right upper limb: Secondary | ICD-10-CM

## 2014-07-21 HISTORY — DX: Carpal tunnel syndrome, right upper limb: G56.01

## 2014-07-22 ENCOUNTER — Encounter (HOSPITAL_BASED_OUTPATIENT_CLINIC_OR_DEPARTMENT_OTHER): Payer: Self-pay | Admitting: Orthopedic Surgery

## 2014-07-26 ENCOUNTER — Other Ambulatory Visit: Payer: Self-pay | Admitting: Physician Assistant

## 2014-08-02 ENCOUNTER — Encounter (HOSPITAL_BASED_OUTPATIENT_CLINIC_OR_DEPARTMENT_OTHER): Payer: Self-pay | Admitting: *Deleted

## 2014-08-07 ENCOUNTER — Encounter (HOSPITAL_BASED_OUTPATIENT_CLINIC_OR_DEPARTMENT_OTHER)
Admission: RE | Admit: 2014-08-07 | Discharge: 2014-08-07 | Disposition: A | Discharge status: Home / Self Care | Payer: BC Managed Care – PPO | Source: Ambulatory Visit | Attending: Orthopedic Surgery | Admitting: Orthopedic Surgery

## 2014-08-07 DIAGNOSIS — Z841 Family history of disorders of kidney and ureter: Secondary | ICD-10-CM | POA: Diagnosis not present

## 2014-08-07 DIAGNOSIS — M179 Osteoarthritis of knee, unspecified: Secondary | ICD-10-CM | POA: Diagnosis not present

## 2014-08-07 DIAGNOSIS — Z809 Family history of malignant neoplasm, unspecified: Secondary | ICD-10-CM | POA: Diagnosis not present

## 2014-08-07 DIAGNOSIS — G5601 Carpal tunnel syndrome, right upper limb: Secondary | ICD-10-CM | POA: Diagnosis present

## 2014-08-07 DIAGNOSIS — Z8249 Family history of ischemic heart disease and other diseases of the circulatory system: Secondary | ICD-10-CM | POA: Diagnosis not present

## 2014-08-07 DIAGNOSIS — E282 Polycystic ovarian syndrome: Secondary | ICD-10-CM | POA: Diagnosis not present

## 2014-08-07 DIAGNOSIS — Z833 Family history of diabetes mellitus: Secondary | ICD-10-CM | POA: Diagnosis not present

## 2014-08-07 DIAGNOSIS — I82409 Acute embolism and thrombosis of unspecified deep veins of unspecified lower extremity: Secondary | ICD-10-CM | POA: Diagnosis not present

## 2014-08-07 DIAGNOSIS — Z4802 Encounter for removal of sutures: Secondary | ICD-10-CM | POA: Diagnosis not present

## 2014-08-07 DIAGNOSIS — I10 Essential (primary) hypertension: Secondary | ICD-10-CM | POA: Diagnosis not present

## 2014-08-07 DIAGNOSIS — M479 Spondylosis, unspecified: Secondary | ICD-10-CM | POA: Diagnosis not present

## 2014-08-07 DIAGNOSIS — Z888 Allergy status to other drugs, medicaments and biological substances status: Secondary | ICD-10-CM | POA: Diagnosis not present

## 2014-08-07 LAB — BASIC METABOLIC PANEL
Anion gap: 12 (ref 5–15)
BUN: 10 mg/dL (ref 6–23)
CO2: 27 mEq/L (ref 19–32)
Calcium: 8.9 mg/dL (ref 8.4–10.5)
Chloride: 102 mEq/L (ref 96–112)
Creatinine, Ser: 0.79 mg/dL (ref 0.50–1.10)
GFR calc Af Amer: >90 mL/min (ref >90)
GFR calc non Af Amer: >90 mL/min (ref >90)
Glucose, Bld: 108 mg/dL — ABNORMAL HIGH (ref 70–99)
Potassium: 3.9 mEq/L (ref 3.7–5.3)
Sodium: 141 mEq/L (ref 137–147)

## 2014-08-07 NOTE — H&P (Signed)
  [name] [address] [place], [place] [address] [phone] A Division of [facility]  Ashley [name], M.D.   Ashley [name], M.D.   Ashley [name], M.D.   Ashley [name], M.D.   Ashley [name], M.D Ashley [name], M.D.  Ashley [name], M.D.  Ashley [name], M.D.    Ashley [name], M.D. Ashley L. Fenton Malling, PA-C  Ashley A. Shepperson, PA-C  Ashley Byron, PA-C [place], [place]   RE: [place], [place]                                [phone]      DOB: [birth year] PROGRESS NOTE: 07-19-14 Ashley [name] is a 38 year-old female who presents for suture removal of her left hand.  She is two weeks status post left carpal tunnel and de Quervain's tenosynovitis release.  Ashley [name] has been doing fairly well, but has been struggling with pain control.  She is currently getting narcotic pain medication from her back doctor.  Otherwise doing well with no systemic complaints.  Another issue Ashley [name] brings up today is wanting to proceed with right carpal tunnel release.  Previous EMG nerve conduction studies reveal mild to moderate compressive neuropathy.  This is bothering Ashley [name] so much that she would like to go ahead and proceed with surgical intervention. Past medical, social and family history reviewed in detail on the patient questionnaire and signed.  Review of systems: As detailed in HPI.  All others reviewed and are negative.   EXAMINATION: Well-developed, well-nourished female in no acute distress.  Alert and oriented x 3.  Examination of her left wrist reveals well healing surgical incisions.  No evidence of infection.  Examination of her right wrist reveals good range of motion.  Mild thenar atrophy.  Positive Phalen's.  Positive Tinel's.  She is neurovascularly intact distally.    IMPRESSION: 1. Status post above surgery, left hand.   2. Right mild to moderate carpal tunnel syndrome.  PLAN: At this point we  are going to go ahead and proceed with right carpal tunnel release.  Paperwork has been completed.  Risks, benefits and possible complications of surgery have been discussed.  Rehab and recovery time discussed.  All questions answered.  We will see Ashley [name] at the time of operative intervention.   Ashley [name], M.D.   Electronically verified by Ashley [name], M.D. DFM(LA):jjh D 07-19-14 T 07-22-14

## 2014-08-08 ENCOUNTER — Encounter (HOSPITAL_BASED_OUTPATIENT_CLINIC_OR_DEPARTMENT_OTHER)
Admission: RE | Disposition: A | Discharge status: Home / Self Care | Payer: Self-pay | Source: Ambulatory Visit | Attending: Orthopedic Surgery

## 2014-08-08 ENCOUNTER — Ambulatory Visit (HOSPITAL_BASED_OUTPATIENT_CLINIC_OR_DEPARTMENT_OTHER): Payer: BC Managed Care – PPO | Admitting: Anesthesiology

## 2014-08-08 ENCOUNTER — Ambulatory Visit (HOSPITAL_BASED_OUTPATIENT_CLINIC_OR_DEPARTMENT_OTHER)
Admission: RE | Admit: 2014-08-08 | Discharge: 2014-08-08 | Disposition: A | Discharge status: Home / Self Care | Payer: BC Managed Care – PPO | Source: Ambulatory Visit | Attending: Orthopedic Surgery | Admitting: Orthopedic Surgery

## 2014-08-08 ENCOUNTER — Encounter (HOSPITAL_BASED_OUTPATIENT_CLINIC_OR_DEPARTMENT_OTHER): Payer: Self-pay | Admitting: *Deleted

## 2014-08-08 DIAGNOSIS — Z4802 Encounter for removal of sutures: Secondary | ICD-10-CM | POA: Insufficient documentation

## 2014-08-08 DIAGNOSIS — G5601 Carpal tunnel syndrome, right upper limb: Secondary | ICD-10-CM | POA: Diagnosis not present

## 2014-08-08 DIAGNOSIS — Z8249 Family history of ischemic heart disease and other diseases of the circulatory system: Secondary | ICD-10-CM | POA: Insufficient documentation

## 2014-08-08 DIAGNOSIS — Z833 Family history of diabetes mellitus: Secondary | ICD-10-CM | POA: Insufficient documentation

## 2014-08-08 DIAGNOSIS — M479 Spondylosis, unspecified: Secondary | ICD-10-CM | POA: Insufficient documentation

## 2014-08-08 DIAGNOSIS — Z841 Family history of disorders of kidney and ureter: Secondary | ICD-10-CM | POA: Insufficient documentation

## 2014-08-08 DIAGNOSIS — E282 Polycystic ovarian syndrome: Secondary | ICD-10-CM | POA: Insufficient documentation

## 2014-08-08 DIAGNOSIS — Z888 Allergy status to other drugs, medicaments and biological substances status: Secondary | ICD-10-CM | POA: Insufficient documentation

## 2014-08-08 DIAGNOSIS — Z809 Family history of malignant neoplasm, unspecified: Secondary | ICD-10-CM | POA: Insufficient documentation

## 2014-08-08 DIAGNOSIS — M179 Osteoarthritis of knee, unspecified: Secondary | ICD-10-CM | POA: Insufficient documentation

## 2014-08-08 DIAGNOSIS — I82409 Acute embolism and thrombosis of unspecified deep veins of unspecified lower extremity: Secondary | ICD-10-CM | POA: Insufficient documentation

## 2014-08-08 DIAGNOSIS — I10 Essential (primary) hypertension: Secondary | ICD-10-CM | POA: Insufficient documentation

## 2014-08-08 HISTORY — PX: CARPAL TUNNEL RELEASE: SHX101

## 2014-08-08 HISTORY — DX: Carpal tunnel syndrome, right upper limb: G56.01

## 2014-08-08 LAB — POCT HEMOGLOBIN-HEMACUE: Hemoglobin: 12.6 g/dL (ref 12.0–15.0)

## 2014-08-08 SURGERY — CARPAL TUNNEL RELEASE
Anesthesia: General | Site: Wrist | Laterality: Right

## 2014-08-08 MED ORDER — LIDOCAINE HCL (CARDIAC) 20 MG/ML IV SOLN
INTRAVENOUS | Status: DC | PRN
  Administered 2014-08-08: 50 mg via INTRAVENOUS

## 2014-08-08 MED ORDER — SCOPOLAMINE 1 MG/3DAYS TD PT72
MEDICATED_PATCH | TRANSDERMAL | Status: AC
  Filled 2014-08-08: qty 1

## 2014-08-08 MED ORDER — FENTANYL CITRATE 0.05 MG/ML IJ SOLN: 50.0000 ug | INTRAMUSCULAR | Status: DC | PRN

## 2014-08-08 MED ORDER — DEXTROSE 5 % IV SOLN
3.0000 g | INTRAVENOUS | Status: AC
  Administered 2014-08-08: 3 g via INTRAVENOUS

## 2014-08-08 MED ORDER — PROPOFOL 10 MG/ML IV BOLUS
INTRAVENOUS | Status: DC | PRN
  Administered 2014-08-08: 300 mg via INTRAVENOUS

## 2014-08-08 MED ORDER — SCOPOLAMINE 1 MG/3DAYS TD PT72
1.0000 | MEDICATED_PATCH | Freq: Once | TRANSDERMAL | Status: DC
  Administered 2014-08-08: 1.5 mg via TRANSDERMAL

## 2014-08-08 MED ORDER — MIDAZOLAM HCL 2 MG/2ML IJ SOLN: 1.0000 mg | INTRAMUSCULAR | Status: DC | PRN

## 2014-08-08 MED ORDER — FENTANYL CITRATE 0.05 MG/ML IJ SOLN
INTRAMUSCULAR | Status: AC
  Filled 2014-08-08: qty 4

## 2014-08-08 MED ORDER — HYDROMORPHONE HCL 1 MG/ML IJ SOLN
0.2500 mg | INTRAMUSCULAR | Status: DC | PRN
Start: 2014-08-08 — End: 2014-08-08
  Administered 2014-08-08: 0.5 mg via INTRAVENOUS

## 2014-08-08 MED ORDER — LACTATED RINGERS IV SOLN: INTRAVENOUS | Status: DC

## 2014-08-08 MED ORDER — MIDAZOLAM HCL 2 MG/2ML IJ SOLN
INTRAMUSCULAR | Status: AC
  Filled 2014-08-08: qty 2

## 2014-08-08 MED ORDER — HYDROMORPHONE HCL 1 MG/ML IJ SOLN
INTRAMUSCULAR | Status: AC
  Filled 2014-08-08: qty 1

## 2014-08-08 MED ORDER — CHLORHEXIDINE GLUCONATE 4 % EX LIQD
60.0000 mL | Freq: Once | CUTANEOUS | Status: DC
Start: 2014-08-08 — End: 2014-08-08

## 2014-08-08 MED ORDER — BUPIVACAINE HCL (PF) 0.5 % IJ SOLN
INTRAMUSCULAR | Status: DC | PRN
  Administered 2014-08-08: 7 mL

## 2014-08-08 MED ORDER — ONDANSETRON HCL 4 MG/2ML IJ SOLN: 4.0000 mg | Freq: Once | INTRAMUSCULAR | Status: DC | PRN

## 2014-08-08 MED ORDER — OXYCODONE HCL 5 MG PO TABS
ORAL_TABLET | ORAL | Status: AC
  Filled 2014-08-08: qty 1

## 2014-08-08 MED ORDER — OXYCODONE HCL 5 MG/5ML PO SOLN: 5.0000 mg | Freq: Once | ORAL | Status: AC | PRN

## 2014-08-08 MED ORDER — DEXAMETHASONE SODIUM PHOSPHATE 10 MG/ML IJ SOLN
INTRAMUSCULAR | Status: DC | PRN
  Administered 2014-08-08: 10 mg via INTRAVENOUS

## 2014-08-08 MED ORDER — LACTATED RINGERS IV SOLN
INTRAVENOUS | Status: DC
  Administered 2014-08-08 (×2): via INTRAVENOUS

## 2014-08-08 MED ORDER — ONDANSETRON HCL 4 MG/2ML IJ SOLN
INTRAMUSCULAR | Status: DC | PRN
  Administered 2014-08-08: 4 mg via INTRAVENOUS

## 2014-08-08 MED ORDER — OXYCODONE HCL 5 MG PO TABS
5.0000 mg | ORAL_TABLET | Freq: Once | ORAL | Status: AC | PRN
Start: 2014-08-08 — End: 2014-08-08
  Administered 2014-08-08: 5 mg via ORAL

## 2014-08-08 MED ORDER — MIDAZOLAM HCL 5 MG/5ML IJ SOLN
INTRAMUSCULAR | Status: DC | PRN
  Administered 2014-08-08: 2 mg via INTRAVENOUS

## 2014-08-08 MED ORDER — FENTANYL CITRATE 0.05 MG/ML IJ SOLN
INTRAMUSCULAR | Status: DC | PRN
  Administered 2014-08-08: 50 ug via INTRAVENOUS
  Administered 2014-08-08: 100 ug via INTRAVENOUS

## 2014-08-08 MED ORDER — MIDAZOLAM HCL 2 MG/ML PO SYRP: 12.0000 mg | ORAL_SOLUTION | Freq: Once | ORAL | Status: DC | PRN

## 2014-08-08 MED ORDER — CEFAZOLIN SODIUM 1-5 GM-% IV SOLN
INTRAVENOUS | Status: AC
  Filled 2014-08-08: qty 50

## 2014-08-08 SURGICAL SUPPLY — 38 items
BANDAGE ELASTIC 3 VELCRO ST LF (GAUZE/BANDAGES/DRESSINGS) ×2 IMPLANT
BLADE SURG 15 STRL LF DISP TIS (BLADE) ×1 IMPLANT
BLADE SURG 15 STRL SS (BLADE) ×1
BNDG COHESIVE 3X5 TAN STRL LF (GAUZE/BANDAGES/DRESSINGS) ×2 IMPLANT
BNDG ESMARK 4X9 LF (GAUZE/BANDAGES/DRESSINGS) ×2 IMPLANT
CORDS BIPOLAR (ELECTRODE) ×2 IMPLANT
COVER BACK TABLE 60X90IN (DRAPES) ×2 IMPLANT
COVER MAYO STAND STRL (DRAPES) ×2 IMPLANT
CUFF TOURNIQUET SINGLE 18IN (TOURNIQUET CUFF) ×4 IMPLANT
DRAPE EXTREMITY T 121X128X90 (DRAPE) ×2 IMPLANT
DRAPE SURG 17X23 STRL (DRAPES) ×2 IMPLANT
DURAPREP 26ML APPLICATOR (WOUND CARE) ×2 IMPLANT
GAUZE SPONGE 4X4 12PLY STRL (GAUZE/BANDAGES/DRESSINGS) ×2 IMPLANT
GAUZE XEROFORM 1X8 LF (GAUZE/BANDAGES/DRESSINGS) ×2 IMPLANT
GLOVE BIO SURGEON STRL SZ 6.5 (GLOVE) ×2 IMPLANT
GLOVE BIOGEL PI IND STRL 6.5 (GLOVE) ×1 IMPLANT
GLOVE BIOGEL PI IND STRL 7.0 (GLOVE) ×1 IMPLANT
GLOVE BIOGEL PI INDICATOR 6.5 (GLOVE) ×1
GLOVE BIOGEL PI INDICATOR 7.0 (GLOVE) ×1
GLOVE ECLIPSE 6.5 STRL STRAW (GLOVE) ×2 IMPLANT
GLOVE ORTHO TXT STRL SZ7.5 (GLOVE) ×2 IMPLANT
GOWN STRL REUS W/ TWL LRG LVL3 (GOWN DISPOSABLE) ×3 IMPLANT
GOWN STRL REUS W/TWL LRG LVL3 (GOWN DISPOSABLE) ×3
NEEDLE HYPO 25X1 1.5 SAFETY (NEEDLE) ×2 IMPLANT
NS IRRIG 1000ML POUR BTL (IV SOLUTION) ×2 IMPLANT
PACK BASIN DAY SURGERY FS (CUSTOM PROCEDURE TRAY) ×2 IMPLANT
PAD CAST 3X4 CTTN HI CHSV (CAST SUPPLIES) ×2 IMPLANT
PADDING CAST ABS 3INX4YD NS (CAST SUPPLIES) ×1
PADDING CAST ABS COTTON 3X4 (CAST SUPPLIES) ×1 IMPLANT
PADDING CAST COTTON 3X4 STRL (CAST SUPPLIES) ×2
SPLINT PLASTER CAST XFAST 3X15 (CAST SUPPLIES) ×10 IMPLANT
SPLINT PLASTER XTRA FASTSET 3X (CAST SUPPLIES) ×10
STOCKINETTE 4X48 STRL (DRAPES) ×2 IMPLANT
SUT ETHILON 3 0 PS 1 (SUTURE) ×2 IMPLANT
SYR BULB 3OZ (MISCELLANEOUS) ×2 IMPLANT
SYR CONTROL 10ML LL (SYRINGE) ×2 IMPLANT
TOWEL OR 17X24 6PK STRL BLUE (TOWEL DISPOSABLE) ×2 IMPLANT
UNDERPAD 30X30 INCONTINENT (UNDERPADS AND DIAPERS) ×2 IMPLANT

## 2014-08-08 NOTE — Interval H&P Note (Signed)
History and Physical Interval Note:  08/08/2014 7:29 AM  Ashley [name]  has presented today for surgery, with the diagnosis of carpal tunnel syndrome, right   The various methods of treatment have been discussed with the patient and family. After consideration of risks, benefits and other options for treatment, the patient has consented to  Procedure(s): RIGHT CARPAL TUNNEL RELEASE (Right) as a surgical intervention .  The patient's history has been reviewed, patient examined, no change in status, stable for surgery.  I have reviewed the patient's chart and labs.  Questions were answered to the patient's satisfaction.     Nymir Ringler F

## 2014-08-08 NOTE — Discharge Instructions (Addendum)
Do not remove splint.  May shower but do not let splint get wet.  Follow up appointment in one week. If you have a plaster or fiberglass splint or cast:  Do not try to scratch the skin under the cast using sharp or pointed objects.  Check the skin around the cast every day. You may put lotion on any red or sore areas.  Keep your cast or splint dry and clean.  Do not put pressure on any part of your cast or splint until it is fully hardened.  Your cast or splint can be protected during bathing with a plastic bag. Do not lower the cast or splint into water.  Take prescribed medication as directed. Only take over-the-counter or prescription medicines for pain, discomfort, or fever as directed by your caregiver.  Use crutches as directed and do not exercise leg unless instructed.  These are not fractures to be taken lightly! If the fracture displaces and gets out of position, it may eventually lead to arthritis and disability for the rest of your life. Problems often follow even the best of care.  Follow all instructions given to you by your caregiver, make and keep follow up appointments. SEEK IMMEDIATE MEDICAL CARE IF:  You develop redness, swelling, numbness or increasing pain in the wound.  There is pus coming from the wound.  An unexplained oral temperature above 102 F (38.9 C) develops.  A bad smell is coming from the wound or dressing.  A breaking open of the wound (edges not staying together) occurs after stitches or staples have been removed. If you do not have a window in your cast for observing the wound, a discharge or minor bleeding may show up as a stain on the outside of your cast immediately after surgery. Report these findings to your caregiver. Document Released: 03/26/2005 Document Revised: 06/27/2013 Document Reviewed: 03/18/2009 [facility] Patient Information 2015 [place], [place]. This information is not intended to replace advice given to you by your health care  provider. Make sure you discuss any questions you have with your health care provider.    Post Anesthesia Home Care Instructions  Activity: Get plenty of rest for the remainder of the day. A responsible adult should stay with you for 24 hours following the procedure.  For the next 24 hours, DO NOT: -Drive a car -Paediatric nurse -Drink alcoholic beverages -Take any medication unless instructed by your physician -Make any legal decisions or sign important papers.  Meals: Start with liquid foods such as gelatin or soup. Progress to regular foods as tolerated. Avoid greasy, spicy, heavy foods. If nausea and/or vomiting occur, drink only clear liquids until the nausea and/or vomiting subsides. Call your physician if vomiting continues.  Special Instructions/Symptoms: Your throat may feel dry or sore from the anesthesia or the breathing tube placed in your throat during surgery. If this causes discomfort, gargle with warm salt water. The discomfort should disappear within 24 hours.

## 2014-08-08 NOTE — Transfer of Care (Signed)
Immediate Anesthesia Transfer of Care Note  Patient: Ashley [name]  Procedure(s) Performed: Procedure(s): RIGHT CARPAL TUNNEL RELEASE (Right)  Patient Location: PACU  Anesthesia Type:General  Level of Consciousness: awake  Airway & Oxygen Therapy: Patient Spontanous Breathing and Patient connected to face mask oxygen  Post-op Assessment: Report given to PACU RN and Post -op Vital signs reviewed and stable  Post vital signs: Reviewed and stable  Complications: No apparent anesthesia complications

## 2014-08-08 NOTE — Anesthesia Preprocedure Evaluation (Signed)
Anesthesia Evaluation  Patient identified by MRN, date of birth, ID band Patient awake    Reviewed: Allergy & Precautions, H&P , NPO status , Patient's Chart, lab work & pertinent test results, reviewed documented beta blocker date and time   Airway Mallampati: I  TM Distance: >3 FB Neck ROM: Full    Dental  (+) Teeth Intact, Dental Advisory Given   Pulmonary  breath sounds clear to auscultation        Cardiovascular hypertension, Pt. on medications and Pt. on home beta blockers Rhythm:Regular Rate:Normal     Neuro/Psych    GI/Hepatic   Endo/Other    Renal/GU      Musculoskeletal   Abdominal   Peds  Hematology   Anesthesia Other Findings   Reproductive/Obstetrics                             Anesthesia Physical Anesthesia Plan  ASA: II  Anesthesia Plan: General   Post-op Pain Management:    Induction: Intravenous  Airway Management Planned: LMA  Additional Equipment:   Intra-op Plan:   Post-operative Plan: Extubation in OR  Informed Consent: I have reviewed the patients History and Physical, chart, labs and discussed the procedure including the risks, benefits and alternatives for the proposed anesthesia with the patient or authorized representative who has indicated his/her understanding and acceptance.   Dental advisory given  Plan Discussed with: CRNA, Anesthesiologist and Surgeon  Anesthesia Plan Comments:         Anesthesia Quick Evaluation

## 2014-08-08 NOTE — Anesthesia Procedure Notes (Signed)
Procedure Name: LMA Insertion Date/Time: 08/08/2014 7:39 AM Performed by: Lieutenant Diego Pre-anesthesia Checklist: Patient identified, Emergency Drugs available, Suction available and Patient being monitored Patient Re-evaluated:Patient Re-evaluated prior to inductionOxygen Delivery Method: Circle System Utilized Preoxygenation: Pre-oxygenation with 100% oxygen Intubation Type: IV induction Ventilation: Mask ventilation without difficulty LMA: LMA inserted LMA Size: 4.0 Number of attempts: 1 Airway Equipment and Method: bite block Placement Confirmation: positive ETCO2 and breath sounds checked- equal and bilateral Tube secured with: Tape Dental Injury: Teeth and Oropharynx as per pre-operative assessment

## 2014-08-08 NOTE — Anesthesia Postprocedure Evaluation (Signed)
  Anesthesia Post-op Note  Patient: Ashley [name]  Procedure(s) Performed: Procedure(s): RIGHT CARPAL TUNNEL RELEASE (Right)  Patient Location: PACU  Anesthesia Type: General   Level of Consciousness: awake, alert  and oriented  Airway and Oxygen Therapy: Patient Spontanous Breathing  Post-op Pain: mild  Post-op Assessment: Post-op Vital signs reviewed  Post-op Vital Signs: Reviewed  Last Vitals:  Filed Vitals:   08/08/14 1005  BP: 147/90  Pulse: 84  Temp: 36.4 C  Resp: 16    Complications: No apparent anesthesia complications

## 2014-08-09 ENCOUNTER — Encounter (HOSPITAL_BASED_OUTPATIENT_CLINIC_OR_DEPARTMENT_OTHER): Payer: Self-pay | Admitting: Orthopedic Surgery

## 2014-08-10 NOTE — Op Note (Signed)
[name]  TERE, MCCONAUGHEY NO.:  [id number]  MEDICAL RECORD NO.:  [id number]  LOCATION:                                 FACILITY:  PHYSICIAN:  Ninetta Lights, M.D. DATE OF BIRTH:  [birth year]  DATE OF PROCEDURE:  08/08/2014 DATE OF DISCHARGE:  08/08/2014                              OPERATIVE REPORT   PREOPERATIVE DIAGNOSIS:  Right carpal tunnel syndrome.  POSTOPERATIVE DIAGNOSIS:  Right carpal tunnel syndrome.  PROCEDURE:  Right carpal tunnel release.  SURGEON:  Ninetta Lights, M.D.  ASSISTANT:  Doran Stabler, PA  ANESTHESIA:  General.  BLOOD LOSS:  Minimal.  SPECIMENS:  None.  CULTURES:  None.  COMPLICATIONS:  None.  DRESSINGS:  Soft compressive bulky hand dressing was applied.  TOURNIQUET TIME:  30 minutes.  DESCRIPTION OF PROCEDURE:  The patient was brought to operating room, placed on the operating table in supine position.  After adequate anesthesia had been obtained, tourniquet applied.  Prepped and draped in usual sterile fashion.  Exsanguinated with elevation of Esmarch. Tourniquet inflated to 250 mmHg.  Small volar incision over the carpal tunnel.  Skin and subcutaneous tissue divided.  Retinaculum incised under direct visualization from the forearm fascia proximally and the palmar arch distally.  Nice complete decompression.  Distal nerve as well as motor branch identified, protected, decompressed.  Wound irrigated. Closed with nylon.  Margins were injected with Marcaine.  Sterile compressive dressing applied.  Tourniquet deflated and removed.  Bulky hand dressing was applied.  Anesthesia reversed.  Brought to the recovery room.  Tolerated the surgery well.  No complications.     Ninetta Lights, M.D.     DFM/MEDQ  D:  08/08/2014  T:  08/08/2014  Job:  [id number]

## 2014-08-11 ENCOUNTER — Emergency Department (INDEPENDENT_AMBULATORY_CARE_PROVIDER_SITE_OTHER)
Admission: EM | Admit: 2014-08-11 | Discharge: 2014-08-11 | Disposition: A | Discharge status: Home / Self Care | Payer: BC Managed Care – PPO | Source: Home / Self Care | Attending: Family Medicine | Admitting: Family Medicine

## 2014-08-11 ENCOUNTER — Emergency Department (INDEPENDENT_AMBULATORY_CARE_PROVIDER_SITE_OTHER): Payer: BC Managed Care – PPO

## 2014-08-11 ENCOUNTER — Encounter (HOSPITAL_COMMUNITY): Payer: Self-pay

## 2014-08-11 DIAGNOSIS — S90121A Contusion of right lesser toe(s) without damage to nail, initial encounter: Secondary | ICD-10-CM

## 2014-08-11 MED ORDER — IBUPROFEN 800 MG PO TABS: 800.0000 mg | ORAL_TABLET | Freq: Three times a day (TID) | ORAL | Status: DC

## 2014-08-11 NOTE — ED Notes (Signed)
States she walked into a wall corner yesterday, injured right 2 nd toe. ? Deformity ?

## 2014-08-11 NOTE — ED Provider Notes (Signed)
CSN: [id number]     Arrival date & time 08/11/14  1621 History   First MD Initiated Contact with Patient 08/11/14 1638     Chief Complaint  Patient presents with  . Toe Injury   (Consider location/radiation/quality/duration/timing/severity/associated sxs/prior Treatment) HPI            38 year old female presents complaining of pain in the right second toe of her right foot. She stubbed it into something yesterday. Now she has pain in the distal part of her second toe. She does not think it is swelling any. Pain is increased with ambulation. No other injury. History of surgical correction of hammertoe In the affected toe.  Past Medical History  Diagnosis Date  . PCOS (polycystic ovarian syndrome)   . Arthritis     knees, back  . 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 Hypertension    Past Surgical History  Procedure Laterality Date  . Ankle surgery      x 2  . Dilation and evacuation  04/02/2011    Procedure: DILATATION AND EVACUATION (D&E);  Surgeon: Luz Lex, MD;  Location: [facility];  Service: Gynecology;  Laterality: N/A;  dvt left mid thigh  . Shoulder arthroscopy Right   . 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 Hysteroscopy w/d&c  07/17/2010    with exc. endometrial polyps  . Total knee arthroplasty Right 08/26/2008  . Endoscopic plantar fasciotomy Left 02/01/2002  . Toe surgery Left 01/10/2003    claw toe correction 2nd, 3rd, 4th toes  . Knee arthroscopy Right 03/07/2003; 01/14/2005; 08/31/2006  . Knee arthroscopy Left 03/21/2003; 11/26/2004  . Carpal tunnel release Left 07/04/2014    Procedure: LEFT CARPAL TUNNEL RELEASE;  Surgeon: Ninetta Lights, MD;  Location: [facility];  Service: Orthopedics;  Laterality: Left;  . Dorsal compartment release Left 07/04/2014    Procedure: LEFT DEQUERVAINS;  Surgeon: Ninetta Lights, MD;  Location: [facility];  Service: Orthopedics;  Laterality: Left;  . Carpal tunnel release Right 08/08/2014   Procedure: RIGHT CARPAL TUNNEL RELEASE;  Surgeon: Ninetta Lights, MD;  Location: [facility];  Service: Orthopedics;  Laterality: Right;   Family History  Problem Relation Age of Onset  . Diabetes Father   . Cancer Father     COLON  . Heart disease Father   . Kidney disease Father     dialysis  . Diabetes Sister   . Cancer Maternal Grandmother     UTERINE  . Cancer Paternal Grandmother     breast   History  Substance Use Topics  . Smoking status: Never Smoker   . Smokeless tobacco: Never Used  . Alcohol Use: No   OB History    Gravida Para Term Preterm AB TAB SAB Ectopic Multiple Living   4 2 1 1 2 1 1   2      Review of Systems  Musculoskeletal:       Right second toe pain, see history of present illness  All other systems reviewed and are negative.   Allergies  Codeine and Cymbalta  Home Medications   Prior to Admission medications   Medication Sig Start Date End Date Taking? Authorizing Provider  hydrochlorothiazide (HYDRODIURIL) 25 MG tablet Take 1 tablet (25 mg total) by mouth daily. 05/20/14   Dot Lanes, MD  ibuprofen (ADVIL,MOTRIN) 800 MG tablet Take 1 tablet (800 mg total) by mouth 3 (three) times daily. 08/11/14   Liam Graham, PA-C  labetalol (NORMODYNE) 200 MG tablet Take 200 mg by mouth 2 (two) times daily.    Historical Provider, MD  Prenatal Vit-Fe Fumarate-FA (PRENATAL MULTIVITAMIN) TABS tablet Take 1 tablet by mouth every morning.     Historical Provider, MD  valACYclovir (VALTREX) 500 MG tablet Take 500 mg by mouth 2 (two) times daily.    Historical Provider, MD   BP 145/93 mmHg  Pulse 82  Temp(Src) 98.6 F (37 C) (Oral)  Resp 16  SpO2 98%  LMP 07/17/2014 (Exact Date) Physical Exam  Constitutional: She is oriented to person, place, and time. Vital signs are normal. She appears well-developed and well-nourished. No distress.  HENT:  Head: Normocephalic and atraumatic.  Pulmonary/Chest: Effort normal. No respiratory  distress.  Musculoskeletal:       Right foot: There is tenderness (distal portion of the second toe) and bony tenderness. There is normal range of motion, no swelling, normal capillary refill and no deformity.  Neurological: She is alert and oriented to person, place, and time. She has normal strength. Coordination normal.  Skin: Skin is warm and dry. No rash noted. She is not diaphoretic.  Psychiatric: She has a normal mood and affect. Judgment normal.  Nursing note and vitals reviewed.   ED Course  Procedures (including critical care time) Labs Review Labs Reviewed - No data to display  Imaging Review Dg Foot Complete Right  08/11/2014   CLINICAL DATA:  Pt hit 2nd toe of right foot;  EXAM: RIGHT FOOT COMPLETE - 3+ VIEW  COMPARISON:  None.  FINDINGS: No fracture or dislocation of mid foot or forefoot. The phalanges are normal. The calcaneus is normal. No soft tissue abnormality.  There is cerclage wire in the proximal phalanx of the first digit. Fixation screw across the talocalcaneal joint  IMPRESSION: No evidence of second toe fracture   Electronically Signed   By: Suzy Bouchard M.D.   On: 08/11/2014 17:10     MDM   1. Contusion, toe, right, initial encounter    x-rays negative. Consistent with contusion. Treat symptomatically, ice, elevation, ibuprofen as needed. Follow-up when necessary  Meds ordered this encounter  Medications  . ibuprofen (ADVIL,MOTRIN) 800 MG tablet    Sig: Take 1 tablet (800 mg total) by mouth 3 (three) times daily.    Dispense:  30 tablet    Refill:  0    Order Specific Question:  Supervising Provider    Answer:  Lynne Leader, [place]      Liam Graham, PA-C 08/11/14 1726

## 2014-08-11 NOTE — Discharge Instructions (Signed)
Contusion °A contusion is a deep bruise. Contusions are the result of an injury that caused bleeding under the skin. The contusion may turn blue, purple, or yellow. Minor injuries will give you a painless contusion, but more severe contusions may stay painful and swollen for a few weeks.  °CAUSES  °A contusion is usually caused by a blow, trauma, or direct force to an area of the body. °SYMPTOMS  °· Swelling and redness of the injured area. °· Bruising of the injured area. °· Tenderness and soreness of the injured area. °· Pain. °DIAGNOSIS  °The diagnosis can be made by taking a history and physical exam. An X-ray, CT scan, or MRI may be needed to determine if there were any associated injuries, such as fractures. °TREATMENT  °Specific treatment will depend on what area of the body was injured. In general, the best treatment for a contusion is resting, icing, elevating, and applying cold compresses to the injured area. Over-the-counter medicines may also be recommended for pain control. Ask your caregiver what the best treatment is for your contusion. °HOME CARE INSTRUCTIONS  °· Put ice on the injured area. °¨ Put ice in a plastic bag. °¨ Place a towel between your skin and the bag. °¨ Leave the ice on for 15-20 minutes, 3-4 times a day, or as directed by your health care provider. °· Only take over-the-counter or prescription medicines for pain, discomfort, or fever as directed by your caregiver. Your caregiver may recommend avoiding anti-inflammatory medicines (aspirin, ibuprofen, and naproxen) for 48 hours because these medicines may increase bruising. °· Rest the injured area. °· If possible, elevate the injured area to reduce swelling. °SEEK IMMEDIATE MEDICAL CARE IF:  °· You have increased bruising or swelling. °· You have pain that is getting worse. °· Your swelling or pain is not relieved with medicines. °MAKE SURE YOU:  °· Understand these instructions. °· Will watch your condition. °· Will get help right  away if you are not doing well or get worse. °Document Released: 06/16/2005 Document Revised: 09/11/2013 Document Reviewed: 07/12/2011 °ExitCare® Patient Information ©2015 [employer]. This information is not intended to replace advice given to you by your health care provider. Make sure you discuss any questions you have with your health care provider. ° °

## 2014-08-21 ENCOUNTER — Other Ambulatory Visit (HOSPITAL_COMMUNITY): Payer: Self-pay | Admitting: Urology

## 2014-08-21 DIAGNOSIS — N360 Urethral fistula: Secondary | ICD-10-CM

## 2014-09-02 ENCOUNTER — Other Ambulatory Visit: Payer: Self-pay | Admitting: Physical Medicine and Rehabilitation

## 2014-09-02 DIAGNOSIS — M545 Low back pain, unspecified: Secondary | ICD-10-CM

## 2014-09-03 ENCOUNTER — Ambulatory Visit (HOSPITAL_COMMUNITY)
Admission: RE | Admit: 2014-09-03 | Discharge: 2014-09-03 | Disposition: A | Discharge status: Home / Self Care | Payer: BC Managed Care – PPO | Source: Ambulatory Visit | Attending: Urology | Admitting: Urology

## 2014-09-03 DIAGNOSIS — R300 Dysuria: Secondary | ICD-10-CM | POA: Insufficient documentation

## 2014-09-03 DIAGNOSIS — N360 Urethral fistula: Secondary | ICD-10-CM

## 2014-09-03 MED ORDER — GADOBENATE DIMEGLUMINE 529 MG/ML IV SOLN
20.0000 mL | Freq: Once | INTRAVENOUS | Status: AC | PRN
  Administered 2014-09-03: 20 mL via INTRAVENOUS

## 2014-09-06 ENCOUNTER — Ambulatory Visit
Admission: RE | Admit: 2014-09-06 | Discharge: 2014-09-06 | Disposition: A | Discharge status: Home / Self Care | Payer: BC Managed Care – PPO | Source: Ambulatory Visit | Attending: Physical Medicine and Rehabilitation | Admitting: Physical Medicine and Rehabilitation

## 2014-09-06 DIAGNOSIS — M545 Low back pain, unspecified: Secondary | ICD-10-CM

## 2015-03-30 ENCOUNTER — Ambulatory Visit (INDEPENDENT_AMBULATORY_CARE_PROVIDER_SITE_OTHER): Payer: 59

## 2015-03-30 ENCOUNTER — Ambulatory Visit (INDEPENDENT_AMBULATORY_CARE_PROVIDER_SITE_OTHER): Payer: 59 | Admitting: Family Medicine

## 2015-03-30 VITALS — BP 138/90 | HR 116 | Temp 102.8°F | Resp 18 | Ht 73.0 in | Wt 294.8 lb

## 2015-03-30 DIAGNOSIS — R509 Fever, unspecified: Secondary | ICD-10-CM

## 2015-03-30 DIAGNOSIS — R05 Cough: Secondary | ICD-10-CM | POA: Diagnosis not present

## 2015-03-30 DIAGNOSIS — R059 Cough, unspecified: Secondary | ICD-10-CM

## 2015-03-30 DIAGNOSIS — J189 Pneumonia, unspecified organism: Secondary | ICD-10-CM

## 2015-03-30 LAB — CBC
HCT: 41.4 % (ref 36.0–46.0)
Hemoglobin: 14 g/dL (ref 12.0–15.0)
MCH: 26.9 pg (ref 26.0–34.0)
MCHC: 33.8 g/dL (ref 30.0–36.0)
MCV: 79.5 fL (ref 78.0–100.0)
MPV: 10 fL (ref 8.6–12.4)
Platelets: 164 10*3/uL (ref 150–400)
RBC: 5.21 MIL/uL — ABNORMAL HIGH (ref 3.87–5.11)
RDW: 15.6 % — ABNORMAL HIGH (ref 11.5–15.5)
WBC: 10.4 10*3/uL (ref 4.0–10.5)

## 2015-03-30 MED ORDER — ALBUTEROL SULFATE HFA 108 (90 BASE) MCG/ACT IN AERS: 2.0000 | INHALATION_SPRAY | RESPIRATORY_TRACT | Status: DC | PRN

## 2015-03-30 MED ORDER — GUAIFENESIN ER 1200 MG PO TB12: 1.0000 | ORAL_TABLET | Freq: Two times a day (BID) | ORAL | Status: DC | PRN

## 2015-03-30 MED ORDER — AMOXICILLIN-POT CLAVULANATE 875-125 MG PO TABS: 1.0000 | ORAL_TABLET | Freq: Two times a day (BID) | ORAL | Status: DC

## 2015-03-30 MED ORDER — HYDROCOD POLST-CPM POLST ER 10-8 MG/5ML PO SUER: 5.0000 mL | Freq: Every evening | ORAL | Status: DC | PRN

## 2015-03-30 MED ORDER — ACETAMINOPHEN 325 MG PO TABS: 500.0000 mg | ORAL_TABLET | Freq: Four times a day (QID) | ORAL | Status: DC | PRN

## 2015-03-30 NOTE — Patient Instructions (Addendum)
Please return tomorrow!  Please drink plenty of water, 64 oz per day which is about 4 regular sized water bottles.   Take your medication to completion.  This is important in order to not rebound. Please take the mucinex, the mucinex is only as good as your hydration.   If you continue to worsen over the next 2 days, we want you to return immediately.  If you continue to have trouble with breathing, I would like you to return immediately.   Pneumonia Pneumonia is an infection of the lungs.  CAUSES Pneumonia may be caused by bacteria or a virus. Usually, these infections are caused by breathing infectious particles into the lungs (respiratory tract). SIGNS AND SYMPTOMS   Cough.  Fever.  Chest pain.  Increased rate of breathing.  Wheezing.  Mucus production. DIAGNOSIS  If you have the common symptoms of pneumonia, your health care provider will typically confirm the diagnosis with a chest X-ray. The X-ray will show an abnormality in the lung (pulmonary infiltrate) if you have pneumonia. Other tests of your blood, urine, or sputum may be done to find the specific cause of your pneumonia. Your health care provider may also do tests (blood gases or pulse oximetry) to see how well your lungs are working. TREATMENT  Some forms of pneumonia may be spread to other people when you cough or sneeze. You may be asked to wear a mask before and during your exam. Pneumonia that is caused by bacteria is treated with antibiotic medicine. Pneumonia that is caused by the influenza virus may be treated with an antiviral medicine. Most other viral infections must run their course. These infections will not respond to antibiotics.  HOME CARE INSTRUCTIONS   Cough suppressants may be used if you are losing too much rest. However, coughing protects you by clearing your lungs. You should avoid using cough suppressants if you can.  Your health care provider may have prescribed medicine if he or she thinks your  pneumonia is caused by bacteria or influenza. Finish your medicine even if you start to feel better.  Your health care provider may also prescribe an expectorant. This loosens the mucus to be coughed up.  Take medicines only as directed by your health care provider.  Do not smoke. Smoking is a common cause of bronchitis and can contribute to pneumonia. If you are a smoker and continue to smoke, your cough may last several weeks after your pneumonia has cleared.  A cold steam vaporizer or humidifier in your room or home may help loosen mucus.  Coughing is often worse at night. Sleeping in a semi-upright position in a recliner or using a couple pillows under your head will help with this.  Get rest as you feel it is needed. Your body will usually let you know when you need to rest. PREVENTION A pneumococcal shot (vaccine) is available to prevent a common bacterial cause of pneumonia. This is usually suggested for:  People over 96 years old.  Patients on chemotherapy.  People with chronic lung problems, such as bronchitis or emphysema.  People with immune system problems. If you are over 65 or have a high risk condition, you may receive the pneumococcal vaccine if you have not received it before. In some countries, a routine influenza vaccine is also recommended. This vaccine can help prevent some cases of pneumonia.You may be offered the influenza vaccine as part of your care. If you smoke, it is time to quit. You may receive instructions on  how to stop smoking. Your health care provider can provide medicines and counseling to help you quit. SEEK MEDICAL CARE IF: You have a fever. SEEK IMMEDIATE MEDICAL CARE IF:   Your illness becomes worse. This is especially true if you are elderly or weakened from any other disease.  You cannot control your cough with suppressants and are losing sleep.  You begin coughing up blood.  You develop pain which is getting worse or is uncontrolled with  medicines.  Any of the symptoms which initially brought you in for treatment are getting worse rather than better.  You develop shortness of breath or chest pain. MAKE SURE YOU:   Understand these instructions.  Will watch your condition.  Will get help right away if you are not doing well or get worse. Document Released: 09/06/2005 Document Revised: 01/21/2014 Document Reviewed: 11/26/2010 [facility] Patient Information 2015 [place], [place]. This information is not intended to replace advice given to you by your health care provider. Make sure you discuss any questions you have with your health care provider.

## 2015-03-30 NOTE — Progress Notes (Addendum)
[facility] [address], [place] [address] [phone]  Date:  03/30/2015   Name:  Ashley [name]   DOB:  22-Jul-1976   MRN:  [id number]  PCP:  Luanne Bras, FNP    History of Present Illness:  Ashley [name] is a 39 y.o. female with PMH of DVT s/p knee replacement, presents to [facility] for chief complaint of productive cough, fever, and body aches. This started about 2-1/2 days ago. Had considerable fatigue and not able to do much. Sputum is green in color. She has some sore throat. No ear aches. Body aches or general but she has no abdominal pain, nausea, or vomiting.  She had some hemoptysis in the beginning, but has not produced any sputum.  She denies any chest pain or calf pain.  She has taken Tylenol cold and sinus with no relief.   Patient Active Problem List   Diagnosis Date Noted  . Pregnancy 04/29/2014  . Miscarriage   . Incomplete miscarriage 04/02/2011    Class: Acute  . Deep vein thrombosis (DVT) 04/02/2011    Class: Chronic    Past Medical History  Diagnosis Date  . PCOS (polycystic ovarian syndrome)   . Arthritis     knees, back  . 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 Hypertension     Past Surgical History  Procedure Laterality Date  . Ankle surgery      x 2  . Dilation and evacuation  04/02/2011    Procedure: DILATATION AND EVACUATION (D&E);  Surgeon: Luz Lex, MD;  Location: [facility];  Service: Gynecology;  Laterality: N/A;  dvt left mid thigh  . Shoulder arthroscopy Right   . 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 Hysteroscopy w/d&c  07/17/2010    with exc. endometrial polyps  . Total knee arthroplasty Right 08/26/2008  . Endoscopic plantar fasciotomy Left 02/01/2002  . Toe surgery Left 01/10/2003    claw toe correction 2nd, 3rd, 4th toes  . Knee arthroscopy Right 03/07/2003; 01/14/2005; 08/31/2006  . Knee arthroscopy Left 03/21/2003; 11/26/2004  . Carpal tunnel release Left 07/04/2014   Procedure: LEFT CARPAL TUNNEL RELEASE;  Surgeon: Ninetta Lights, MD;  Location: [facility];  Service: Orthopedics;  Laterality: Left;  . Dorsal compartment release Left 07/04/2014    Procedure: LEFT DEQUERVAINS;  Surgeon: Ninetta Lights, MD;  Location: [facility];  Service: Orthopedics;  Laterality: Left;  . Carpal tunnel release Right 08/08/2014    Procedure: RIGHT CARPAL TUNNEL RELEASE;  Surgeon: Ninetta Lights, MD;  Location: [facility];  Service: Orthopedics;  Laterality: Right;    History  Substance Use Topics  . Smoking status: Never Smoker   . Smokeless tobacco: Never Used  . Alcohol Use: No    Family History  Problem Relation Age of Onset  . Diabetes Father   . Cancer Father     COLON  . Heart disease Father   . Kidney disease Father     dialysis  . Diabetes Sister   . Cancer Maternal Grandmother     UTERINE  . Cancer Paternal Grandmother     breast    Allergies  Allergen Reactions  . Codeine Itching and Nausea Only  . Cymbalta [Duloxetine Hcl] Other (See Comments)    Altered mental status    Medication list has been reviewed and updated.  Current Outpatient Prescriptions on File Prior to Visit  Medication Sig Dispense Refill  .  hydrochlorothiazide (HYDRODIURIL) 25 MG tablet Take 1 tablet (25 mg total) by mouth daily. 30 tablet 0  . ibuprofen (ADVIL,MOTRIN) 800 MG tablet Take 1 tablet (800 mg total) by mouth 3 (three) times daily. (Patient not taking: Reported on 03/30/2015) 30 tablet 0  . labetalol (NORMODYNE) 200 MG tablet Take 200 mg by mouth 2 (two) times daily.    . Prenatal Vit-Fe Fumarate-FA (PRENATAL MULTIVITAMIN) TABS tablet Take 1 tablet by mouth every morning.     . valACYclovir (VALTREX) 500 MG tablet Take 500 mg by mouth 2 (two) times daily.     No current facility-administered medications on file prior to visit.    ROS ROS otherwise unremarkable unless listed above.  Physical Examination: BP  138/90 mmHg  Pulse 116  Temp(Src) 102.8 F (39.3 C) (Oral)  Resp 18  Ht 6\' 1"  (1.854 m)  Wt 294 lb 12.8 oz (133.72 kg)  BMI 38.90 kg/m2  SpO2 97%  LMP 03/21/2015 Ideal Body Weight: Weight in (lb) to have BMI = 25: 189.1  Physical Exam alert, cooperative, oriented 4. PERRLA with normal conjunctiva. No lymphadenopathy seen pre--/post--her radicular, cervical, tonsillar, submandibular, or supraclavicular.  Breath sounds have mild wheezing in all lobes with some crackling along right and left base. Tachycardic without murmurs, rubs, or gallops. Normal radial and DP pulses.  No calf tenderness or change.    [facility] reading (PRIMARY) by  Dr. Brigitte Pulse: Right lower lobe infiltrate.  Assessment and Plan: 39 year old female with past medical history of DVT and others listed above, is here today for generalized body aches, fever, and productive cough.  Moderate criteria: fever, previous DVT, mild hemoptysis, tachycardia.  Tylenol given.  However, more likely to be pneumonia at this time. However, I have given her alarming symptoms that would warrant immediate return.  Chest x-ray consistent with pneumonia. Infiltrate is apparent and will cover for common. Placed her on Augmentin. CBC ordered. Advised to return to clinic tomorrow for follow-up.     1. Fever, unspecified fever cause - acetaminophen (TYLENOL) tablet 487.5 mg; Take 1.5 tablets (487.5 mg total) by mouth every 6 (six) hours as needed for fever. - DG Chest 2 View - CBC  2. Cough - CBC   Ivar Drape, PA-C [facility] 03/30/2015 11:52 AM

## 2015-03-30 NOTE — Progress Notes (Signed)
Patient ID: Ashley [name], female   DOB: May 04, 1976, 39 y.o.   MRN: [id number] Reviewed documentation and xray and agree w/ assessment and plan. Pt will plan to f/u w/ me tomorrow evening - sent in a rx for albterol inh per pt request but since tachycardic w/ illness will want to monitor closely. Delman Cheadle, MD MPH

## 2015-03-30 NOTE — Progress Notes (Signed)
   Subjective:    Patient ID: Ashley [name], female    DOB: 1975-12-27, 39 y.o.   MRN: [id number]  HPI    Review of Systems     Objective:   Physical Exam        Assessment & Plan:

## 2015-03-30 NOTE — Addendum Note (Signed)
Addended by: Ivar Drape D on: 03/30/2015 06:11 PM   Modules accepted: Level of Service

## 2015-03-31 ENCOUNTER — Ambulatory Visit (INDEPENDENT_AMBULATORY_CARE_PROVIDER_SITE_OTHER): Payer: 59 | Admitting: Family Medicine

## 2015-03-31 ENCOUNTER — Inpatient Hospital Stay (HOSPITAL_COMMUNITY)
Admission: AD | Admit: 2015-03-31 | Discharge: 2015-04-05 | DRG: 194 | Disposition: A | Discharge status: Home / Self Care | Payer: 59 | Source: Ambulatory Visit | Attending: Internal Medicine | Admitting: Internal Medicine

## 2015-03-31 ENCOUNTER — Encounter: Payer: Self-pay | Admitting: Family Medicine

## 2015-03-31 VITALS — BP 112/75 | HR 120 | Temp 102.8°F | Resp 18 | Ht 71.0 in | Wt 291.1 lb

## 2015-03-31 DIAGNOSIS — R042 Hemoptysis: Secondary | ICD-10-CM

## 2015-03-31 DIAGNOSIS — I10 Essential (primary) hypertension: Secondary | ICD-10-CM | POA: Diagnosis present

## 2015-03-31 DIAGNOSIS — J181 Lobar pneumonia, unspecified organism: Secondary | ICD-10-CM | POA: Diagnosis present

## 2015-03-31 DIAGNOSIS — Z885 Allergy status to narcotic agent status: Secondary | ICD-10-CM | POA: Diagnosis not present

## 2015-03-31 DIAGNOSIS — Z96653 Presence of artificial knee joint, bilateral: Secondary | ICD-10-CM | POA: Diagnosis present

## 2015-03-31 DIAGNOSIS — R509 Fever, unspecified: Secondary | ICD-10-CM | POA: Diagnosis not present

## 2015-03-31 DIAGNOSIS — A6000 Herpesviral infection of urogenital system, unspecified: Secondary | ICD-10-CM | POA: Diagnosis present

## 2015-03-31 DIAGNOSIS — E860 Dehydration: Secondary | ICD-10-CM | POA: Diagnosis present

## 2015-03-31 DIAGNOSIS — G5601 Carpal tunnel syndrome, right upper limb: Secondary | ICD-10-CM | POA: Diagnosis present

## 2015-03-31 DIAGNOSIS — M1990 Unspecified osteoarthritis, unspecified site: Secondary | ICD-10-CM | POA: Diagnosis present

## 2015-03-31 DIAGNOSIS — J189 Pneumonia, unspecified organism: Secondary | ICD-10-CM | POA: Diagnosis not present

## 2015-03-31 DIAGNOSIS — N179 Acute kidney failure, unspecified: Secondary | ICD-10-CM | POA: Diagnosis present

## 2015-03-31 DIAGNOSIS — E282 Polycystic ovarian syndrome: Secondary | ICD-10-CM | POA: Diagnosis present

## 2015-03-31 DIAGNOSIS — E876 Hypokalemia: Secondary | ICD-10-CM | POA: Diagnosis present

## 2015-03-31 DIAGNOSIS — J90 Pleural effusion, not elsewhere classified: Secondary | ICD-10-CM

## 2015-03-31 DIAGNOSIS — R05 Cough: Secondary | ICD-10-CM | POA: Diagnosis present

## 2015-03-31 DIAGNOSIS — R944 Abnormal results of kidney function studies: Secondary | ICD-10-CM | POA: Diagnosis present

## 2015-03-31 LAB — POCT CBC
Granulocyte percent: 81 %G — AB (ref 37–80)
HCT, POC: 46.9 % (ref 37.7–47.9)
Hemoglobin: 14.7 g/dL (ref 12.2–16.2)
Lymph, poc: 1.3 (ref 0.6–3.4)
MCH, POC: 24.4 pg — AB (ref 27–31.2)
MCHC: 31.4 g/dL — AB (ref 31.8–35.4)
MCV: 77.9 fL — AB (ref 80–97)
MID (cbc): 0.7 (ref 0–0.9)
MPV: 8.2 fL (ref 0–99.8)
POC Granulocyte: 8.3 — AB (ref 2–6.9)
POC LYMPH PERCENT: 12.5 %L (ref 10–50)
POC MID %: 6.5 %M (ref 0–12)
Platelet Count, POC: 166 10*3/uL (ref 142–424)
RBC: 6.01 M/uL — AB (ref 4.04–5.48)
RDW, POC: 16.2 %
WBC: 10.3 10*3/uL — AB (ref 4.6–10.2)

## 2015-03-31 MED ORDER — IBUPROFEN 800 MG PO TABS
800.0000 mg | ORAL_TABLET | Freq: Four times a day (QID) | ORAL | Status: DC | PRN
  Administered 2015-04-01 – 2015-04-04 (×4): 800 mg via ORAL
  Filled 2015-03-31 (×4): qty 1

## 2015-03-31 MED ORDER — PRENATAL MULTIVITAMIN CH
1.0000 | ORAL_TABLET | Freq: Every morning | ORAL | Status: DC
  Administered 2015-04-01 – 2015-04-05 (×5): 1 via ORAL
  Filled 2015-03-31 (×5): qty 1

## 2015-03-31 MED ORDER — LABETALOL HCL 100 MG PO TABS
200.0000 mg | ORAL_TABLET | Freq: Two times a day (BID) | ORAL | Status: DC
  Administered 2015-04-01 – 2015-04-05 (×9): 200 mg via ORAL
  Filled 2015-03-31 (×10): qty 2

## 2015-03-31 MED ORDER — HYDROCOD POLST-CPM POLST ER 10-8 MG/5ML PO SUER
5.0000 mL | Freq: Every evening | ORAL | Status: DC | PRN
  Administered 2015-03-31: 5 mL via ORAL
  Filled 2015-03-31: qty 5

## 2015-03-31 MED ORDER — ACETAMINOPHEN 500 MG PO TABS: 1000.0000 mg | ORAL_TABLET | Freq: Four times a day (QID) | ORAL | Status: DC | PRN

## 2015-03-31 MED ORDER — CEFTRIAXONE SODIUM IN DEXTROSE 20 MG/ML IV SOLN
1.0000 g | INTRAVENOUS | Status: DC
  Administered 2015-03-31 – 2015-04-04 (×5): 1 g via INTRAVENOUS
  Filled 2015-03-31 (×6): qty 50

## 2015-03-31 MED ORDER — IPRATROPIUM BROMIDE 0.02 % IN SOLN
0.5000 mg | Freq: Once | RESPIRATORY_TRACT | Status: AC
  Administered 2015-03-31: 0.5 mg via RESPIRATORY_TRACT

## 2015-03-31 MED ORDER — AZITHROMYCIN 250 MG PO TABS
500.0000 mg | ORAL_TABLET | ORAL | Status: DC
  Administered 2015-03-31 – 2015-04-04 (×5): 500 mg via ORAL
  Filled 2015-03-31 (×5): qty 2

## 2015-03-31 MED ORDER — SODIUM CHLORIDE 0.9 % IV SOLN
INTRAVENOUS | Status: DC
  Administered 2015-03-31 – 2015-04-01 (×2): via INTRAVENOUS

## 2015-03-31 MED ORDER — ACETAMINOPHEN 325 MG PO TABS
1000.0000 mg | ORAL_TABLET | Freq: Once | ORAL | Status: AC
  Administered 2015-03-31: 975 mg via ORAL

## 2015-03-31 MED ORDER — LEVALBUTEROL HCL 0.63 MG/3ML IN NEBU
0.6300 mg | INHALATION_SOLUTION | Freq: Once | RESPIRATORY_TRACT | Status: AC
  Administered 2015-03-31: 0.63 mg via RESPIRATORY_TRACT

## 2015-03-31 MED ORDER — VALACYCLOVIR HCL 500 MG PO TABS
500.0000 mg | ORAL_TABLET | Freq: Two times a day (BID) | ORAL | Status: DC
  Administered 2015-04-01 – 2015-04-05 (×8): 500 mg via ORAL
  Filled 2015-03-31 (×10): qty 1

## 2015-03-31 MED ORDER — HEPARIN SODIUM (PORCINE) 5000 UNIT/ML IJ SOLN
5000.0000 [IU] | Freq: Three times a day (TID) | INTRAMUSCULAR | Status: DC
  Administered 2015-03-31 – 2015-04-05 (×14): 5000 [IU] via SUBCUTANEOUS
  Filled 2015-03-31 (×15): qty 1

## 2015-03-31 MED ORDER — ONDANSETRON 4 MG PO TBDP
8.0000 mg | ORAL_TABLET | Freq: Once | ORAL | Status: AC
  Administered 2015-03-31: 8 mg via ORAL

## 2015-03-31 MED ORDER — ALBUTEROL SULFATE (2.5 MG/3ML) 0.083% IN NEBU
2.5000 mg | INHALATION_SOLUTION | RESPIRATORY_TRACT | Status: DC | PRN
  Administered 2015-04-01: 2.5 mg via RESPIRATORY_TRACT
  Filled 2015-03-31: qty 3

## 2015-03-31 NOTE — H&P (Signed)
[facility] History and Physical  LADAIJA DIMINO UVO:[id number] DOB: Jun 02, 1976 DOA: 03/31/2015  Referring physician: EDP PCP: Luanne Bras, FNP   Chief Complaint: Cough   HPI: Ashley [name] is a 39 y.o. female who is sent over as a direct admit from [facility] after worsening symptoms, and persistent fever despite attempted outpatient treatment of a RLL PNA.  Patient was initially seen yesterday on 7/10 in the office with productive cough, fever, tachycardia, some hemoptysis.  CXR showed right lower lobe PNA, CBC was unremarkable.  Patient started on augmentin, tylenol for fever.  Despite treatment her fever has persisted, and her symptoms somewhat worsened when she is seen in the office for follow up today.  She therefore is sent over as direct admit for CAP.  Review of Systems: Systems reviewed.  As above, otherwise negative  Past Medical History  Diagnosis Date  . PCOS (polycystic ovarian syndrome)   . Arthritis     knees, back  . 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 Hypertension    Past Surgical History  Procedure Laterality Date  . Ankle surgery      x 2  . Dilation and evacuation  04/02/2011    Procedure: DILATATION AND EVACUATION (D&E);  Surgeon: Luz Lex, MD;  Location: [facility];  Service: Gynecology;  Laterality: N/A;  dvt left mid thigh  . Shoulder arthroscopy Right   . 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 Hysteroscopy w/d&c  07/17/2010    with exc. endometrial polyps  . Total knee arthroplasty Right 08/26/2008  . Endoscopic plantar fasciotomy Left 02/01/2002  . Toe surgery Left 01/10/2003    claw toe correction 2nd, 3rd, 4th toes  . Knee arthroscopy Right 03/07/2003; 01/14/2005; 08/31/2006  . Knee arthroscopy Left 03/21/2003; 11/26/2004  . Carpal tunnel release Left 07/04/2014    Procedure: LEFT CARPAL TUNNEL RELEASE;  Surgeon: Ninetta Lights, MD;  Location: [place];  Service: Orthopedics;  Laterality: Left;  .  Dorsal compartment release Left 07/04/2014    Procedure: LEFT DEQUERVAINS;  Surgeon: Ninetta Lights, MD;  Location: [place];  Service: Orthopedics;  Laterality: Left;  . Carpal tunnel release Right 08/08/2014    Procedure: RIGHT CARPAL TUNNEL RELEASE;  Surgeon: Ninetta Lights, MD;  Location: [place];  Service: Orthopedics;  Laterality: Right;   Social History:  reports that she has never smoked. She has never used smokeless tobacco. She reports that she does not drink alcohol or use illicit drugs.  Allergies  Allergen Reactions  . Codeine Itching and Nausea Only  . Cymbalta [Duloxetine Hcl] Other (See Comments)    Altered mental status    Family History  Problem Relation Age of Onset  . Diabetes Father   . Cancer Father     COLON  . Heart disease Father   . Kidney disease Father     dialysis  . Diabetes Sister   . Cancer Maternal Grandmother     UTERINE  . Cancer Paternal Grandmother     breast     Prior to Admission medications   Medication Sig Start Date End Date Taking? Authorizing Provider  albuterol (PROVENTIL HFA;VENTOLIN HFA) 108 (90 BASE) MCG/ACT inhaler Inhale 2 puffs into the lungs every 4 (four) hours as needed for wheezing or shortness of breath (cough, shortness of breath or wheezing.). 03/30/15   Shawnee Knapp, MD  chlorpheniramine-HYDROcodone [facility] PENNKINETIC ER) 10-8 MG/5ML SUER Take 5 mLs by mouth at bedtime as  needed for cough. 03/30/15   Dorian Heckle English, PA  Guaifenesin (MUCINEX MAXIMUM STRENGTH) 1200 MG TB12 Take 1 tablet (1,200 mg total) by mouth every 12 (twelve) hours as needed. 03/30/15   Dorian Heckle English, PA  hydrochlorothiazide (HYDRODIURIL) 25 MG tablet Take 1 tablet (25 mg total) by mouth daily. 05/20/14   Leonard Schwartz, MD  ibuprofen (ADVIL,MOTRIN) 800 MG tablet Take 1 tablet (800 mg total) by mouth 3 (three) times daily. 08/11/14   Liam Graham, PA-C  labetalol (NORMODYNE) 200 MG tablet Take 200 mg by  mouth 2 (two) times daily.    Historical Provider, MD  lisinopril-hydrochlorothiazide (PRINZIDE,ZESTORETIC) 10-12.5 MG per tablet Take 1 tablet by mouth daily.    Historical Provider, MD  Prenatal Vit-Fe Fumarate-FA (PRENATAL MULTIVITAMIN) TABS tablet Take 1 tablet by mouth every morning.     Historical Provider, MD  valACYclovir (VALTREX) 500 MG tablet Take 500 mg by mouth 2 (two) times daily.    Historical Provider, MD   Physical Exam: Filed Vitals:   03/31/15 2148  BP: 116/70  Pulse: 105  Temp: 102.5 F (39.2 C)  Resp: 18    BP 116/70 mmHg  Pulse 105  Temp(Src) 102.5 F (39.2 C) (Oral)  Resp 18  Ht 6' (1.829 m)  Wt 132.45 kg (292 lb)  BMI 39.59 kg/m2  SpO2 92%  LMP 03/21/2015  General Appearance:    Alert, oriented, no distress, appears stated age  Head:    Normocephalic, atraumatic  Eyes:    PERRL, EOMI, sclera non-icteric        Nose:   Nares without drainage or epistaxis. Mucosa, turbinates normal  Throat:   Moist mucous membranes. Oropharynx without erythema or exudate.  Neck:   Supple. No carotid bruits.  No thyromegaly.  No lymphadenopathy.   Back:     No CVA tenderness, no spinal tenderness  Lungs:     Diffuse expiratory wheezes, productive cough  Chest wall:    No tenderness to palpitation  Heart:    Regular rate and rhythm without murmurs, gallops, rubs  Abdomen:     Soft, non-tender, nondistended, normal bowel sounds, no organomegaly  Genitalia:    deferred  Rectal:    deferred  Extremities:   No clubbing, cyanosis or edema.  Pulses:   2+ and symmetric all extremities  Skin:   Skin color, texture, turgor normal, no rashes or lesions  Lymph nodes:   Cervical, supraclavicular, and axillary nodes normal  Neurologic:   CNII-XII intact. Normal strength, sensation and reflexes      throughout    Labs on Admission:  Basic Metabolic Panel: No results for input(s): NA, K, CL, CO2, GLUCOSE, BUN, CREATININE, CALCIUM, MG, PHOS in the last 168 hours. Liver Function  Tests: No results for input(s): AST, ALT, ALKPHOS, BILITOT, PROT, ALBUMIN in the last 168 hours. No results for input(s): LIPASE, AMYLASE in the last 168 hours. No results for input(s): AMMONIA in the last 168 hours. CBC:  Recent Labs Lab 03/30/15 1157 03/31/15 2014  WBC 10.4 10.3*  HGB 14.0 14.7  HCT 41.4 46.9  MCV 79.5 77.9*  PLT 164  --    Cardiac Enzymes: No results for input(s): CKTOTAL, CKMB, CKMBINDEX, TROPONINI in the last 168 hours.  BNP (last 3 results) No results for input(s): PROBNP in the last 8760 hours. CBG: No results for input(s): GLUCAP in the last 168 hours.  Radiological Exams on Admission: Dg Chest 2 View  03/30/2015   CLINICAL DATA:  Patient with  productive cough and fever.  EXAM: CHEST  2 VIEW  COMPARISON:  Chest radiograph 11/06/2011; CT 09/16/2013  FINDINGS: Normal cardiac and mediastinal contours. Interval development of right lower lung pulmonary consolidation. No pleural effusion or pneumothorax. Regional skeleton is unremarkable.  IMPRESSION: Interval development of right lower lung pulmonary consolidation compatible with pneumonia. Followup PA and lateral chest X-ray is recommended in 3-4 weeks following trial of antibiotic therapy to ensure resolution and exclude underlying malignancy.   Electronically Signed   By: Lovey Newcomer M.D.   On: 03/30/2015 14:51    EKG: Independently reviewed.  Assessment/Plan Principal Problem:   CAP (community acquired pneumonia) Active Problems:   Right lower lobe pneumonia   Fever   1. RLL CAP causing fever -  1. Rocephin and azithromycin ordered 2. Blood and sputum cultures ordered 3. Tylenol and Ibuprofen for fever 4. BMP pending, holding lisinopril-hctz as she seems slightly dehydrated 5. IVF    Code Status: Full Code  Family Communication: Family at bedside Disposition Plan: Admit to inpatient   Time spent: 25 min  Othello Dickenson M. [facility] Pager [phone]  If 7AM-7PM, please contact  the day team taking care of the patient [email or web address] Password [facility] 03/31/2015, 10:28 PM

## 2015-03-31 NOTE — Patient Instructions (Addendum)
Pt was seen yesterday, started on Augmentin - she has only had 2 doses. Her last dose of Augmentin was at 8:00 am and none since then. She also had 800 mg of Ibuprofen at 8:00 am. She had 500 mg of Tylenol at noon today. We started an IV for fluids at 20:05, 8 mg of Zofran at 20:10, 1,000 mg of Tylenol at 20:12, Xopenex and Atrovent at 20:20. Had large RLL pneumonia found on CXR, no pulmonary history, cbc was normal.

## 2015-03-31 NOTE — Progress Notes (Signed)
Subjective:   This chart was scribed for Dr. Delman Cheadle, MD by Erling Conte, ED Scribe. This patient was seen in Room 9 and the patient's care was started at 7:39 PM.   Patient ID: Ashley [name], female    DOB: 1975/10/23, 39 y.o.   MRN: [id number]  Chief Complaint  Patient presents with  . Cough    1 day follow-up from cough, fever, & body aches. Cant get fever down    HPI HPI Comments: Ashley [name] is a 39 y.o. female who presents to the [facility] for a follow up of her office visit yesterday.  Pt was seen yesterday for fever, tachycardia, productive cough with some hemoptysis. CXR showed right lower lobe pneumonia but blood count was normal. Pt was started on Augmentin yesterday.  She took 1 dose last night and one this morning. Her last dose was 12 hours ago and Ibuprofen 800 mg at that time as well. She took 500 mg of Tylenol at 1 PM today.   Pt has not had an appetite but has tried to drink a little water - she doesn't remember when she drank last.  Urinating normally because of her diuretic anti-hypertensive med. Pt reports that her fever has not broken at all in the last 24 hrs. She reports associated productive cough of green sputum, body aches and nausea. She denies any h/o asthma or sig pulmonary sxs. Pt does not have an albuterol inhaler to use at home - just the augmentin, ibuprofen, and mucinex. She denies any vomiting or diarrhea.   Patient Active Problem List   Diagnosis Date Noted  . Pregnancy 04/29/2014  . Miscarriage   . Incomplete miscarriage 04/02/2011    Class: Acute  . Deep vein thrombosis (DVT) 04/02/2011    Class: Chronic   Past Medical History  Diagnosis Date  . PCOS (polycystic ovarian syndrome)   . Arthritis     knees, back  . 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 Hypertension    Past Surgical History  Procedure Laterality Date  . Ankle surgery      x 2  . Dilation and evacuation   04/02/2011    Procedure: DILATATION AND EVACUATION (D&E);  Surgeon: Luz Lex, MD;  Location: [place] [facility];  Service: Gynecology;  Laterality: N/A;  dvt left mid thigh  . Shoulder arthroscopy Right   . 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 Hysteroscopy w/d&c  07/17/2010    with exc. endometrial polyps  . Total knee arthroplasty Right 08/26/2008  . Endoscopic plantar fasciotomy Left 02/01/2002  . Toe surgery Left 01/10/2003    claw toe correction 2nd, 3rd, 4th toes  . Knee arthroscopy Right 03/07/2003; 01/14/2005; 08/31/2006  . Knee arthroscopy Left 03/21/2003; 11/26/2004  . Carpal tunnel release Left 07/04/2014    Procedure: LEFT CARPAL TUNNEL RELEASE;  Surgeon: Ninetta Lights, MD;  Location: [place];  Service: Orthopedics;  Laterality: Left;  . Dorsal compartment release Left 07/04/2014    Procedure: LEFT DEQUERVAINS;  Surgeon: Ninetta Lights, MD;  Location: [place];  Service: Orthopedics;  Laterality: Left;  . Carpal tunnel release Right 08/08/2014    Procedure: RIGHT CARPAL TUNNEL RELEASE;  Surgeon: Ninetta Lights, MD;  Location: [place];  Service: Orthopedics;  Laterality: Right;   Allergies  Allergen Reactions  . Codeine Itching and Nausea Only  . Cymbalta [Duloxetine Hcl] Other (See Comments)    Altered mental  status   Prior to Admission medications   Medication Sig Start Date End Date Taking? Authorizing Provider  albuterol (PROVENTIL HFA;VENTOLIN HFA) 108 (90 BASE) MCG/ACT inhaler Inhale 2 puffs into the lungs every 4 (four) hours as needed for wheezing or shortness of breath (cough, shortness of breath or wheezing.). 03/30/15  Yes Shawnee Knapp, MD  amoxicillin-clavulanate (AUGMENTIN) 875-125 MG per tablet Take 1 tablet by mouth 2 (two) times daily. 03/30/15 04/09/15 Yes Stephanie D English, PA  chlorpheniramine-HYDROcodone (TUSSIONEX PENNKINETIC ER) 10-8 MG/5ML SUER Take 5 mLs by mouth at bedtime as needed for cough. 03/30/15  Yes  Stephanie D English, PA  Guaifenesin (MUCINEX MAXIMUM STRENGTH) 1200 MG TB12 Take 1 tablet (1,200 mg total) by mouth every 12 (twelve) hours as needed. 03/30/15  Yes Stephanie D English, PA  hydrochlorothiazide (HYDRODIURIL) 25 MG tablet Take 1 tablet (25 mg total) by mouth daily. 05/20/14  Yes Leonard Schwartz, MD  ibuprofen (ADVIL,MOTRIN) 800 MG tablet Take 1 tablet (800 mg total) by mouth 3 (three) times daily. 08/11/14  Yes Liam Graham, PA-C  labetalol (NORMODYNE) 200 MG tablet Take 200 mg by mouth 2 (two) times daily.   Yes Historical Provider, MD  lisinopril-hydrochlorothiazide (PRINZIDE,ZESTORETIC) 10-12.5 MG per tablet Take 1 tablet by mouth daily.   Yes Historical Provider, MD  Prenatal Vit-Fe Fumarate-FA (PRENATAL MULTIVITAMIN) TABS tablet Take 1 tablet by mouth every morning.    Yes Historical Provider, MD  valACYclovir (VALTREX) 500 MG tablet Take 500 mg by mouth 2 (two) times daily.   Yes Historical Provider, MD   History   Social History  . Marital Status: Legally Separated    Spouse Name: N/A  . Number of Children: N/A  . Years of Education: N/A   Occupational History  . Not on file.   Social History Main Topics  . Smoking status: Never Smoker   . Smokeless tobacco: Never Used  . Alcohol Use: No  . Drug Use: No  . Sexual Activity: No   Other Topics Concern  . Not on file   Social History Narrative    Review of Systems  Constitutional: Positive for fever (at triage was 102.8 F), chills, diaphoresis, activity change, appetite change and fatigue.  Respiratory: Positive for cough, chest tightness and shortness of breath. Negative for wheezing.   Cardiovascular: Positive for chest pain.  Gastrointestinal: Positive for nausea and vomiting. Negative for abdominal pain and diarrhea.  Musculoskeletal: Positive for myalgias, back pain, arthralgias and neck stiffness.  Skin: Negative for rash.  Allergic/Immunologic: Negative for environmental allergies and immunocompromised  state.  Neurological: Positive for dizziness, speech difficulty, weakness, light-headedness and headaches. Negative for syncope.  Psychiatric/Behavioral: Positive for confusion.       Objective:  BP 112/75 mmHg  Pulse 120  Temp(Src) 102.8 F (39.3 C) (Oral)  Resp 18  Ht 5\' 11"  (1.803 m)  Wt 291 lb 2 oz (132.053 kg)  BMI 40.62 kg/m2  SpO2 92%  LMP 03/21/2015    Physical Exam  Constitutional: She is oriented to person, place, and time. She appears well-developed and well-nourished. She appears listless. She is easily aroused. She has a sickly appearance. She appears ill.  HENT:  Head: Normocephalic and atraumatic.  Eyes: Conjunctivae and EOM are normal.  Neck: Neck supple. No tracheal deviation present.  Cardiovascular: Regular rhythm and normal heart sounds.  Tachycardia present.   Pulmonary/Chest: Effort normal. No respiratory distress. She has wheezes (diffuse expiratory). She has no rhonchi.  Poor air movement  Musculoskeletal: Normal range  of motion.  Neurological: She is oriented to person, place, and time and easily aroused. She appears listless.  Skin: Skin is warm. She is diaphoretic.  Psychiatric: She has a normal mood and affect. Her behavior is normal.  Nursing note and vitals reviewed.     Assessment & Plan:  Pt initially seen yest and diagnosed with RLL CAPna, no pulm hx. Started on Augmentin - she has had 2 doses. Has been alternative tylenol and ibuprofen without benefit. Has continued to take her diuretics for BP - has been to sedated to eat/drink today. 8:05 PM Given Zofran 8 mg, Xopenox, Atrovent, and Tylenol 1g in office.  IV placed for NS and transport by EMS to [facility] for direct admit to the hosp service in gen med bed.  [facility] med team 4 at [address].  [facility]. 1. CAP (community acquired pneumonia)     Orders Placed This Encounter  Procedures  . POCT CBC    Meds ordered this encounter  Medications  . ipratropium (ATROVENT) nebulizer  solution 0.5 mg    Sig:   . levalbuterol (XOPENEX) nebulizer solution 0.63 mg    Sig:   . ondansetron (ZOFRAN-ODT) disintegrating tablet 8 mg    Sig:   . acetaminophen (TYLENOL) tablet 975 mg    Sig:     I personally performed the services described in this documentation, which was scribed in my presence. The recorded information has been reviewed and considered, and addended by me as needed.  Delman Cheadle, MD MPH

## 2015-04-01 DIAGNOSIS — N179 Acute kidney failure, unspecified: Secondary | ICD-10-CM

## 2015-04-01 DIAGNOSIS — E876 Hypokalemia: Secondary | ICD-10-CM

## 2015-04-01 LAB — URINE MICROSCOPIC-ADD ON

## 2015-04-01 LAB — STREP PNEUMONIAE URINARY ANTIGEN: Strep Pneumo Urinary Antigen: NEGATIVE

## 2015-04-01 LAB — URINALYSIS, ROUTINE W REFLEX MICROSCOPIC
Glucose, UA: NEGATIVE mg/dL
Hgb urine dipstick: NEGATIVE
Ketones, ur: NEGATIVE mg/dL
Leukocytes, UA: NEGATIVE
Nitrite: NEGATIVE
Protein, ur: 30 mg/dL — AB
Specific Gravity, Urine: 1.022 (ref 1.005–1.030)
Urobilinogen, UA: 4 mg/dL — ABNORMAL HIGH (ref 0.0–1.0)
pH: 5.5 (ref 5.0–8.0)

## 2015-04-01 LAB — EXPECTORATED SPUTUM ASSESSMENT W GRAM STAIN, RFLX TO RESP C

## 2015-04-01 LAB — BASIC METABOLIC PANEL
Anion gap: 9 (ref 5–15)
BUN: 13 mg/dL (ref 6–20)
CO2: 27 mmol/L (ref 22–32)
Calcium: 8 mg/dL — ABNORMAL LOW (ref 8.9–10.3)
Chloride: 97 mmol/L — ABNORMAL LOW (ref 101–111)
Creatinine, Ser: 1.33 mg/dL — ABNORMAL HIGH (ref 0.44–1.00)
GFR calc Af Amer: 57 mL/min — ABNORMAL LOW (ref >60)
GFR calc non Af Amer: 49 mL/min — ABNORMAL LOW (ref >60)
Glucose, Bld: 151 mg/dL — ABNORMAL HIGH (ref 65–99)
Potassium: 3 mmol/L — ABNORMAL LOW (ref 3.5–5.1)
Sodium: 133 mmol/L — ABNORMAL LOW (ref 135–145)

## 2015-04-01 LAB — EXPECTORATED SPUTUM ASSESSMENT W REFEX TO RESP CULTURE

## 2015-04-01 LAB — PREGNANCY, URINE: Preg Test, Ur: NEGATIVE

## 2015-04-01 LAB — TSH: TSH: 1.718 u[IU]/mL (ref 0.350–4.500)

## 2015-04-01 LAB — HIV ANTIBODY (ROUTINE TESTING W REFLEX): HIV Screen 4th Generation wRfx: NONREACTIVE

## 2015-04-01 LAB — MAGNESIUM: Magnesium: 2 mg/dL (ref 1.7–2.4)

## 2015-04-01 MED ORDER — POTASSIUM CHLORIDE CRYS ER 20 MEQ PO TBCR
40.0000 meq | EXTENDED_RELEASE_TABLET | Freq: Once | ORAL | Status: AC
  Administered 2015-04-01: 40 meq via ORAL
  Filled 2015-04-01: qty 2

## 2015-04-01 MED ORDER — IPRATROPIUM-ALBUTEROL 0.5-2.5 (3) MG/3ML IN SOLN
3.0000 mL | Freq: Three times a day (TID) | RESPIRATORY_TRACT | Status: DC
  Administered 2015-04-01 – 2015-04-03 (×5): 3 mL via RESPIRATORY_TRACT
  Filled 2015-04-01 (×5): qty 3

## 2015-04-01 MED ORDER — IPRATROPIUM-ALBUTEROL 0.5-2.5 (3) MG/3ML IN SOLN
3.0000 mL | Freq: Three times a day (TID) | RESPIRATORY_TRACT | Status: DC
  Administered 2015-04-01: 3 mL via RESPIRATORY_TRACT
  Filled 2015-04-01: qty 3

## 2015-04-01 MED ORDER — ACETAMINOPHEN 325 MG PO TABS
650.0000 mg | ORAL_TABLET | Freq: Four times a day (QID) | ORAL | Status: DC | PRN
Start: 2015-04-01 — End: 2015-04-05
  Administered 2015-04-02 – 2015-04-05 (×2): 650 mg via ORAL
  Filled 2015-04-01 (×2): qty 2

## 2015-04-01 MED ORDER — HYDROCOD POLST-CPM POLST ER 10-8 MG/5ML PO SUER
5.0000 mL | Freq: Two times a day (BID) | ORAL | Status: DC | PRN
  Administered 2015-04-01 (×2): 5 mL via ORAL
  Filled 2015-04-01 (×2): qty 5

## 2015-04-01 MED ORDER — POTASSIUM CHLORIDE IN NACL 20-0.9 MEQ/L-% IV SOLN
INTRAVENOUS | Status: AC
  Administered 2015-04-01 – 2015-04-03 (×4): via INTRAVENOUS
  Filled 2015-04-01 (×4): qty 1000

## 2015-04-01 MED ORDER — GUAIFENESIN ER 600 MG PO TB12
600.0000 mg | ORAL_TABLET | Freq: Two times a day (BID) | ORAL | Status: DC
  Administered 2015-04-01 – 2015-04-05 (×9): 600 mg via ORAL
  Filled 2015-04-01 (×9): qty 1

## 2015-04-01 NOTE — Progress Notes (Signed)
Utilization review completed. Dashia Caldeira, RN, BSN. 

## 2015-04-01 NOTE — Progress Notes (Signed)
PROGRESS NOTE  Ashley [name] OZD:[id number] DOB: 1976-04-12 DOA: 03/31/2015 PCP: Luanne Bras, FNP  HPI/Recap of past 24 hours:  Fever subsided, Feeling tired, still cough, no appetite  Assessment/Plan: Principal Problem:   CAP (community acquired pneumonia) Active Problems:   Right lower lobe pneumonia   Fever  CAP: on rocephin and zithromax/mucinex/duoneb. hiv pending, urine legionella pending, negative urine strep pneumo,  Reported her 62months old baby was sick and was treated for pneumonia.  Hypokalemia, replace k, check mag  AKI; cr mildly elevated, likely from dehydration, ua pending, on ivf.  H/o recurrent genital herpes: 4 outbreaks last 24months, last outbreak a month ago.  Code Status: Full  Family Communication: patient and her roommate  Disposition Plan: home when medically stable   Consultants:  none  Procedures:  none  Antibiotics:  Rocephin/zithro from admission   Objective: BP 122/63 mmHg  Pulse 87  Temp(Src) 100.2 F (37.9 C) (Oral)  Resp 16  Ht 6' (1.829 m)  Wt 132.45 kg (292 lb)  BMI 39.59 kg/m2  SpO2 93%  LMP 03/21/2015  Intake/Output Summary (Last 24 hours) at 04/01/15 1228 Last data filed at 04/01/15 0655  Gross per 24 hour  Intake    236 ml  Output    500 ml  Net   -264 ml   Filed Weights   03/31/15 2148  Weight: 132.45 kg (292 lb)    Exam:   General:  Ill appearing, intermittent congested cough, minimal productive  Cardiovascular: RRR  Respiratory: course, mild wheezing  Abdomen: Soft/ND/NT, positive BS  Musculoskeletal: No Edema  Neuro: aaox3  Data Reviewed: Basic Metabolic Panel:  Recent Labs Lab 03/31/15 2306  NA 133*  K 3.0*  CL 97*  CO2 27  GLUCOSE 151*  BUN 13  CREATININE 1.33*  CALCIUM 8.0*   Liver Function Tests: No results for input(s): AST, ALT, ALKPHOS, BILITOT, PROT, ALBUMIN in the last 168 hours. No results for input(s): LIPASE, AMYLASE in the last 168 hours. No  results for input(s): AMMONIA in the last 168 hours. CBC:  Recent Labs Lab 03/30/15 1157 03/31/15 2014  WBC 10.4 10.3*  HGB 14.0 14.7  HCT 41.4 46.9  MCV 79.5 77.9*  PLT 164  --    Cardiac Enzymes:   No results for input(s): CKTOTAL, CKMB, CKMBINDEX, TROPONINI in the last 168 hours. BNP (last 3 results) No results for input(s): BNP in the last 8760 hours.  ProBNP (last 3 results) No results for input(s): PROBNP in the last 8760 hours.  CBG: No results for input(s): GLUCAP in the last 168 hours.  Recent Results (from the past 240 hour(s))  Culture, sputum-assessment     Status: None   Collection Time: 04/01/15 12:31 AM  Result Value Ref Range Status   Specimen Description SPU  Final   Special Requests NONE  Final   Sputum evaluation   Final    THIS SPECIMEN IS ACCEPTABLE. RESPIRATORY CULTURE REPORT TO FOLLOW.   Report Status 04/01/2015 FINAL  Final     Studies: No results found.  Scheduled Meds: . azithromycin  500 mg Oral Q24H  . cefTRIAXone (ROCEPHIN)  IV  1 g Intravenous Q24H  . guaiFENesin  600 mg Oral BID  . heparin  5,000 Units Subcutaneous 3 times per day  . ipratropium-albuterol  3 mL Nebulization 3 times per day  . labetalol  200 mg Oral BID  . potassium chloride  40 mEq Oral Once  . prenatal multivitamin  1 tablet Oral q morning -  10a  . valACYclovir  500 mg Oral BID    Continuous Infusions: . 0.9 % NaCl with KCl 20 mEq / L       Time spent: 87mins  Willim Turnage MD, PhD  [facility] Pager [phone]. If 7PM-7AM, please contact night-coverage at [email or web address], password [facility] 04/01/2015, 12:28 PM  LOS: 1 day

## 2015-04-02 ENCOUNTER — Encounter (HOSPITAL_COMMUNITY): Payer: Self-pay

## 2015-04-02 DIAGNOSIS — E860 Dehydration: Secondary | ICD-10-CM

## 2015-04-02 DIAGNOSIS — J189 Pneumonia, unspecified organism: Secondary | ICD-10-CM

## 2015-04-02 LAB — LEGIONELLA ANTIGEN, URINE

## 2015-04-02 MED ORDER — GUAIFENESIN 100 MG/5ML PO SYRP
200.0000 mg | ORAL_SOLUTION | ORAL | Status: DC | PRN
  Administered 2015-04-02 – 2015-04-04 (×3): 200 mg via ORAL
  Filled 2015-04-02 (×5): qty 10

## 2015-04-02 MED ORDER — TRAZODONE HCL 50 MG PO TABS
50.0000 mg | ORAL_TABLET | Freq: Every evening | ORAL | Status: DC | PRN
  Administered 2015-04-03: 50 mg via ORAL
  Filled 2015-04-02: qty 1

## 2015-04-02 NOTE — Progress Notes (Signed)
[facility] PROGRESS NOTE  Ashley [name] NAT:[id number] DOB: 1976-08-05 DOA: 03/31/2015  PCP: Luanne Bras, FNP  Brief HPI: 39 year old African-American female presented with fever, cough, tachycardia. Chest x-ray showed right lower lobe pneumonia. She failed outpatient treatment and was hospitalized for further management.  Past medical history:  Past Medical History  Diagnosis Date  . PCOS (polycystic ovarian syndrome)   . Arthritis     knees, back  . 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 Hypertension     Consultants: None  Procedures: None  Antibiotics: Initially placed on Augmentin  Currently on ceftriaxone and azithromycin. Since 7/11  Subjective: Patient continues to have a cough. Denies any chest pain. Continues to have some difficulty breathing. Not much improvement over the last 48 hours. Denies nausea, vomiting.  Objective: Vital Signs  Filed Vitals:   04/01/15 2115 04/01/15 2244 04/02/15 0540 04/02/15 0915  BP:  125/62 114/69   Pulse:  104 92   Temp: 99.8 F (37.7 C) 99.9 F (37.7 C) 99.6 F (37.6 C)   TempSrc: Oral Oral Oral   Resp:  18 18   Height:      Weight:      SpO2:  94% 93% 94%    Intake/Output Summary (Last 24 hours) at 04/02/15 1105 Last data filed at 04/02/15 0900  Gross per 24 hour  Intake 1727.5 ml  Output      0 ml  Net 1727.5 ml   Filed Weights   03/31/15 2148  Weight: 132.45 kg (292 lb)    General appearance: alert, cooperative, appears stated age and no distress Resp: Rhonchi at bilateral bases, right more than left. Few crackles at the right base. No wheezing. Cardio: regular rate and rhythm, S1, S2 normal, no murmur, click, rub or gallop GI: soft, non-tender; bowel sounds normal; no masses,  no organomegaly Extremities: extremities normal, atraumatic, no cyanosis or edema Neurologic: Alert and oriented 3. No focal neurological deficits are noted.  Lab Results:  Basic Metabolic  Panel:  Recent Labs Lab 03/31/15 2306 04/01/15 1440  NA 133*  --   K 3.0*  --   CL 97*  --   CO2 27  --   GLUCOSE 151*  --   BUN 13  --   CREATININE 1.33*  --   CALCIUM 8.0*  --   MG  --  2.0   CBC:  Recent Labs Lab 03/30/15 1157 03/31/15 2014  WBC 10.4 10.3*  HGB 14.0 14.7  HCT 41.4 46.9  MCV 79.5 77.9*  PLT 164  --      Recent Results (from the past 240 hour(s))  Culture, blood (routine x 2) Call MD if unable to obtain prior to antibiotics being given     Status: None (Preliminary result)   Collection Time: 03/31/15 11:06 PM  Result Value Ref Range Status   Specimen Description BLOOD LEFT ANTECUBITAL  Final   Special Requests   Final    BOTTLES DRAWN AEROBIC AND ANAEROBIC 3CC BLUE 5CC RED   Culture NO GROWTH < 24 HOURS  Final   Report Status PENDING  Incomplete  Culture, blood (routine x 2) Call MD if unable to obtain prior to antibiotics being given     Status: None (Preliminary result)   Collection Time: 03/31/15 11:12 PM  Result Value Ref Range Status   Specimen Description BLOOD LEFT HAND  Final   Special Requests BOTTLES DRAWN AEROBIC ONLY 10CC  Final   Culture NO GROWTH < 24  HOURS  Final   Report Status PENDING  Incomplete  Culture, sputum-assessment     Status: None   Collection Time: 04/01/15 12:31 AM  Result Value Ref Range Status   Specimen Description SPU  Final   Special Requests NONE  Final   Sputum evaluation   Final    THIS SPECIMEN IS ACCEPTABLE. RESPIRATORY CULTURE REPORT TO FOLLOW.   Report Status 04/01/2015 FINAL  Final  Culture, respiratory (NON-Expectorated)     Status: None (Preliminary result)   Collection Time: 04/01/15 12:31 AM  Result Value Ref Range Status   Specimen Description SPU  Final   Special Requests NONE  Final   Gram Stain   Final    ABUNDANT WBC PRESENT,BOTH PMN AND MONONUCLEAR RARE SQUAMOUS EPITHELIAL CELLS PRESENT FEW GRAM POSITIVE COCCI IN PAIRS IN CLUSTERS RARE GRAM POSITIVE RODS Performed at [facility]    Culture   Final    NORMAL OROPHARYNGEAL FLORA Performed at [facility]    Report Status PENDING  Incomplete      Studies/Results: No results found.  Medications:  Scheduled: . azithromycin  500 mg Oral Q24H  . cefTRIAXone (ROCEPHIN)  IV  1 g Intravenous Q24H  . guaiFENesin  600 mg Oral BID  . heparin  5,000 Units Subcutaneous 3 times per day  . ipratropium-albuterol  3 mL Nebulization TID  . labetalol  200 mg Oral BID  . prenatal multivitamin  1 tablet Oral q morning - 10a  . valACYclovir  500 mg Oral BID   Continuous: . 0.9 % NaCl with KCl 20 mEq / L 75 mL/hr at 04/02/15 0357   GQB:[id number], albuterol, guaifenesin, ibuprofen  Assessment/Plan:  Principal Problem:   CAP (community acquired pneumonia) Active Problems:   Right lower lobe pneumonia   Fever    Community-acquired pneumonia Slow to improve. Continue ceftriaxone and azithromycin. Continue mucinex. Urine for strep antigen negative. HIV is nonreactive. Urine for legionella is negative as well. Blood cultures are negative so far. Sputum cultures pending. Reportedly, her 56 month old daughter was sick recently.  Dehydration with mildly elevated creatinine Secondary to infection. Continue IV fluids.  Hypokalemia This was repleted. We will repeat labs tomorrow morning. Magnesium is normal.  History of recurrent genital herpes Currently on suppressive treatment. Continue Valtrex.  History of essential hypertension Continue with home medications. Blood pressure is reasonably well controlled.  DVT Prophylaxis: Subcutaneous heparin    Code Status: Full code  Family Communication: Discussed with the patient  Disposition Plan: Continue current treatment for now.  Follow-up Appointment?: Will need to follow-up with her primary care physician within a week after discharge.   LOS: 2 days   [facility] Pager [phone] 04/02/2015, 11:05 AM  If 7PM-7AM, please  contact night-coverage at [email or web address], password [facility]

## 2015-04-03 ENCOUNTER — Inpatient Hospital Stay (HOSPITAL_COMMUNITY): Payer: 59

## 2015-04-03 DIAGNOSIS — I10 Essential (primary) hypertension: Secondary | ICD-10-CM | POA: Diagnosis present

## 2015-04-03 DIAGNOSIS — E860 Dehydration: Secondary | ICD-10-CM | POA: Diagnosis present

## 2015-04-03 LAB — COMPREHENSIVE METABOLIC PANEL
ALT: 25 U/L (ref 14–54)
AST: 28 U/L (ref 15–41)
Albumin: 2.5 g/dL — ABNORMAL LOW (ref 3.5–5.0)
Alkaline Phosphatase: 56 U/L (ref 38–126)
Anion gap: 6 (ref 5–15)
BUN: <5 mg/dL — ABNORMAL LOW (ref 6–20)
CO2: 25 mmol/L (ref 22–32)
Calcium: 8 mg/dL — ABNORMAL LOW (ref 8.9–10.3)
Chloride: 108 mmol/L (ref 101–111)
Creatinine, Ser: 0.84 mg/dL (ref 0.44–1.00)
GFR calc Af Amer: >60 mL/min (ref >60)
GFR calc non Af Amer: >60 mL/min (ref >60)
Glucose, Bld: 102 mg/dL — ABNORMAL HIGH (ref 65–99)
Potassium: 3.8 mmol/L (ref 3.5–5.1)
Sodium: 139 mmol/L (ref 135–145)
Total Bilirubin: 0.2 mg/dL — ABNORMAL LOW (ref 0.3–1.2)
Total Protein: 5.9 g/dL — ABNORMAL LOW (ref 6.5–8.1)

## 2015-04-03 LAB — CBC
HCT: 33.1 % — ABNORMAL LOW (ref 36.0–46.0)
Hemoglobin: 10.9 g/dL — ABNORMAL LOW (ref 12.0–15.0)
MCH: 26.3 pg (ref 26.0–34.0)
MCHC: 32.9 g/dL (ref 30.0–36.0)
MCV: 79.8 fL (ref 78.0–100.0)
Platelets: 152 10*3/uL (ref 150–400)
RBC: 4.15 MIL/uL (ref 3.87–5.11)
RDW: 16.1 % — ABNORMAL HIGH (ref 11.5–15.5)
WBC: 5 10*3/uL (ref 4.0–10.5)

## 2015-04-03 LAB — CULTURE, RESPIRATORY W GRAM STAIN

## 2015-04-03 LAB — CULTURE, RESPIRATORY: Culture: NORMAL

## 2015-04-03 MED ORDER — PHENOL 1.4 % MT LIQD
1.0000 | Freq: Four times a day (QID) | OROMUCOSAL | Status: DC | PRN
  Administered 2015-04-03: 1 via OROMUCOSAL
  Filled 2015-04-03: qty 177

## 2015-04-03 MED ORDER — IPRATROPIUM-ALBUTEROL 0.5-2.5 (3) MG/3ML IN SOLN: 3.0000 mL | RESPIRATORY_TRACT | Status: DC | PRN

## 2015-04-03 NOTE — Progress Notes (Signed)
[facility] PROGRESS NOTE  Ashley [name] WJX:[id number] DOB: [birth year] DOA: 03/31/2015  PCP: Luanne Bras, FNP  Brief HPI: 39 year old African-American female presented with fever, cough, tachycardia. Chest x-ray showed right lower lobe pneumonia. She failed outpatient treatment and was hospitalized for further management.  Past medical history:  Past Medical History  Diagnosis Date  . PCOS (polycystic ovarian syndrome)   . Arthritis     knees, back  . 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 Hypertension     Consultants: None  Procedures: None  Antibiotics: Initially placed on Augmentin  Currently on ceftriaxone and azithromycin. Since 7/11  Subjective: Patient feels some better but continues to have cough with yellowish-green expectoration. No chest pain. Because of the cough. She couldn't sleep last night. Denies nausea, vomiting.  Objective: Vital Signs  Filed Vitals:   04/02/15 2100 04/02/15 2119 04/03/15 0528 04/03/15 0850  BP:  132/78 124/71   Pulse:  103 79   Temp:  101.1 F (38.4 C) 98.8 F (37.1 C)   TempSrc:  Oral Oral   Resp:  18 18   Height:      Weight:      SpO2: 93% 91% 92% 95%    Intake/Output Summary (Last 24 hours) at 04/03/15 2130 Last data filed at 04/03/15 8657  Gross per 24 hour  Intake 2935.25 ml  Output      0 ml  Net 2935.25 ml   Filed Weights   03/31/15 2148  Weight: 132.45 kg (292 lb)    General appearance: alert, cooperative, appears stated age and no distress Resp: Diminished air entry at the bases. Rhonchi at bilateral bases, right more than left. Few crackles at the right base. No wheezing. Cardio: regular rate and rhythm, S1, S2 normal, no murmur, click, rub or gallop GI: soft, non-tender; bowel sounds normal; no masses,  no organomegaly Extremities: extremities normal, atraumatic, no cyanosis or edema Neurologic: Alert and oriented 3. No focal neurological deficits are noted.  Lab  Results:  Basic Metabolic Panel:  Recent Labs Lab 03/31/15 2306 04/01/15 1440 04/03/15 0344  NA 133*  --  139  K 3.0*  --  3.8  CL 97*  --  108  CO2 27  --  25  GLUCOSE 151*  --  102*  BUN 13  --  <5*  CREATININE 1.33*  --  0.84  CALCIUM 8.0*  --  8.0*  MG  --  2.0  --    CBC:  Recent Labs Lab 03/30/15 1157 03/31/15 2014 04/03/15 0344  WBC 10.4 10.3* 5.0  HGB 14.0 14.7 10.9*  HCT 41.4 46.9 33.1*  MCV 79.5 77.9* 79.8  PLT 164  --  152     Recent Results (from the past 240 hour(s))  Culture, blood (routine x 2) Call MD if unable to obtain prior to antibiotics being given     Status: None (Preliminary result)   Collection Time: 03/31/15 11:06 PM  Result Value Ref Range Status   Specimen Description BLOOD LEFT ANTECUBITAL  Final   Special Requests   Final    BOTTLES DRAWN AEROBIC AND ANAEROBIC 3CC BLUE 5CC RED   Culture NO GROWTH 2 DAYS  Final   Report Status PENDING  Incomplete  Culture, blood (routine x 2) Call MD if unable to obtain prior to antibiotics being given     Status: None (Preliminary result)   Collection Time: 03/31/15 11:12 PM  Result Value Ref Range Status   Specimen Description BLOOD  LEFT HAND  Final   Special Requests BOTTLES DRAWN AEROBIC ONLY 10CC  Final   Culture NO GROWTH 2 DAYS  Final   Report Status PENDING  Incomplete  Culture, sputum-assessment     Status: None   Collection Time: 04/01/15 12:31 AM  Result Value Ref Range Status   Specimen Description SPU  Final   Special Requests NONE  Final   Sputum evaluation   Final    THIS SPECIMEN IS ACCEPTABLE. RESPIRATORY CULTURE REPORT TO FOLLOW.   Report Status 04/01/2015 FINAL  Final  Culture, respiratory (NON-Expectorated)     Status: None   Collection Time: 04/01/15 12:31 AM  Result Value Ref Range Status   Specimen Description SPU  Final   Special Requests NONE  Final   Gram Stain   Final    ABUNDANT WBC PRESENT,BOTH PMN AND MONONUCLEAR RARE SQUAMOUS EPITHELIAL CELLS PRESENT FEW GRAM  POSITIVE COCCI IN PAIRS IN CLUSTERS RARE GRAM POSITIVE RODS Performed at [facility]    Culture   Final    NORMAL OROPHARYNGEAL FLORA Performed at [facility]    Report Status 04/03/2015 FINAL  Final      Studies/Results: No results found.  Medications:  Scheduled: . azithromycin  500 mg Oral Q24H  . cefTRIAXone (ROCEPHIN)  IV  1 g Intravenous Q24H  . guaiFENesin  600 mg Oral BID  . heparin  5,000 Units Subcutaneous 3 times per day  . labetalol  200 mg Oral BID  . prenatal multivitamin  1 tablet Oral q morning - 10a  . valACYclovir  500 mg Oral BID   Continuous: . 0.9 % NaCl with KCl 20 mEq / L 75 mL/hr at 04/03/15 0809   VOJ:[id number], guaifenesin, ibuprofen, ipratropium-albuterol, phenol, traZODone  Assessment/Plan:  Principal Problem:   CAP (community acquired pneumonia) Active Problems:   Right lower lobe pneumonia   Fever    Community-acquired pneumonia Slow to improve but perhaps slightly better this morning. Continue ceftriaxone and azithromycin. Continue mucinex. Urine for strep antigen negative. HIV is nonreactive. Urine for legionella is negative as well. Blood cultures are negative so far. Sputum cultures pending. Reportedly, her 18 month old daughter was sick recently. Repeat chest x-ray.  Dehydration with mildly elevated creatinine Secondary to infection. Continue IV fluids. Blood work shows improvement.  Hypokalemia This was repleted. Magnesium is normal.  History of recurrent genital herpes Currently on suppressive treatment. Continue Valtrex.  History of essential hypertension Continue with home medications. Blood pressure is reasonably well controlled.  DVT Prophylaxis: Subcutaneous heparin    Code Status: Full code  Family Communication: Discussed with the patient  Disposition Plan: Continue current treatment for now. Repeat chest x-ray today.  Follow-up Appointment?: Will need to follow-up with her primary care  physician within a week after discharge.   LOS: 3 days   [facility] Pager [phone] 04/03/2015, 9:22 AM  If 7PM-7AM, please contact night-coverage at [email or web address], password [facility]

## 2015-04-04 ENCOUNTER — Encounter (HOSPITAL_COMMUNITY): Payer: Self-pay

## 2015-04-04 ENCOUNTER — Inpatient Hospital Stay (HOSPITAL_COMMUNITY): Payer: 59

## 2015-04-04 LAB — BASIC METABOLIC PANEL
Anion gap: 7 (ref 5–15)
BUN: <5 mg/dL — ABNORMAL LOW (ref 6–20)
CO2: 24 mmol/L (ref 22–32)
Calcium: 8.4 mg/dL — ABNORMAL LOW (ref 8.9–10.3)
Chloride: 107 mmol/L (ref 101–111)
Creatinine, Ser: 0.68 mg/dL (ref 0.44–1.00)
GFR calc Af Amer: >60 mL/min (ref >60)
GFR calc non Af Amer: >60 mL/min (ref >60)
Glucose, Bld: 95 mg/dL (ref 65–99)
Potassium: 4.2 mmol/L (ref 3.5–5.1)
Sodium: 138 mmol/L (ref 135–145)

## 2015-04-04 LAB — CBC
HCT: 34.9 % — ABNORMAL LOW (ref 36.0–46.0)
Hemoglobin: 11.4 g/dL — ABNORMAL LOW (ref 12.0–15.0)
MCH: 25.6 pg — ABNORMAL LOW (ref 26.0–34.0)
MCHC: 32.7 g/dL (ref 30.0–36.0)
MCV: 78.4 fL (ref 78.0–100.0)
Platelets: 182 10*3/uL (ref 150–400)
RBC: 4.45 MIL/uL (ref 3.87–5.11)
RDW: 15.8 % — ABNORMAL HIGH (ref 11.5–15.5)
WBC: 4.8 10*3/uL (ref 4.0–10.5)

## 2015-04-04 LAB — HEMOGLOBIN A1C
Hgb A1c MFr Bld: 6.1 % — ABNORMAL HIGH (ref 4.8–5.6)
Mean Plasma Glucose: 128 mg/dL

## 2015-04-04 MED ORDER — IOHEXOL 300 MG/ML  SOLN
80.0000 mL | Freq: Once | INTRAMUSCULAR | Status: AC | PRN
  Administered 2015-04-04: 80 mL via INTRAVENOUS

## 2015-04-04 NOTE — Progress Notes (Signed)
[facility] PROGRESS NOTE  Ashley [name] OTL:[id number] DOB: 1976-08-07 DOA: 03/31/2015  PCP: Luanne Bras, FNP  Brief HPI: 39 year old African-American female presented with fever, cough, tachycardia. Chest x-ray showed right lower lobe pneumonia. She failed outpatient treatment and was hospitalized for further management.  Past medical history:  Past Medical History  Diagnosis Date  . PCOS (polycystic ovarian syndrome)   . Arthritis     knees, back  . 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 Hypertension     Consultants: None  Procedures: None  Antibiotics: Initially placed on Augmentin  Currently on ceftriaxone and azithromycin since 7/11  Subjective: Patient continues to have coughing spells at times. She has noticed some blood tinged sputum, just mostly yellowish in color. Some pleuritic type chest pain, especially when she coughs. Overall, she feels perhaps slight improvement in her condition.   Objective: Vital Signs  Filed Vitals:   04/03/15 1700 04/03/15 2120 04/04/15 0515 04/04/15 0540  BP: 157/59 136/75 131/82   Pulse: 102 91 85   Temp: 98.4 F (36.9 C) 99.1 F (37.3 C) 98.4 F (36.9 C)   TempSrc: Oral Oral Oral   Resp: 18 19 18    Height:      Weight:      SpO2: 93% 93% 89% 93%    Intake/Output Summary (Last 24 hours) at 04/04/15 0848 Last data filed at 04/03/15 1935  Gross per 24 hour  Intake    720 ml  Output      0 ml  Net    720 ml   Filed Weights   03/31/15 2148  Weight: 132.45 kg (292 lb)    General appearance: alert, cooperative, appears stated age and no distress Resp: Continues to have diminished air entry at the bases, right more than left. No rhonchi are heard today. Crackles present bilaterally at the bases, right more than left.  Cardio: regular rate and rhythm, S1, S2 normal, no murmur, click, rub or gallop GI: soft, non-tender; bowel sounds normal; no masses,  no organomegaly Extremities:  extremities normal, atraumatic, no cyanosis or edema Neurologic: Alert and oriented 3. No focal neurological deficits are noted.  Lab Results:  Basic Metabolic Panel:  Recent Labs Lab 03/31/15 2306 04/01/15 1440 04/03/15 0344 04/04/15 0353  NA 133*  --  139 138  K 3.0*  --  3.8 4.2  CL 97*  --  108 107  CO2 27  --  25 24  GLUCOSE 151*  --  102* 95  BUN 13  --  <5* <5*  CREATININE 1.33*  --  0.84 0.68  CALCIUM 8.0*  --  8.0* 8.4*  MG  --  2.0  --   --    CBC:  Recent Labs Lab 03/30/15 1157 03/31/15 2014 04/03/15 0344 04/04/15 0353  WBC 10.4 10.3* 5.0 4.8  HGB 14.0 14.7 10.9* 11.4*  HCT 41.4 46.9 33.1* 34.9*  MCV 79.5 77.9* 79.8 78.4  PLT 164  --  152 182     Recent Results (from the past 240 hour(s))  Culture, blood (routine x 2) Call MD if unable to obtain prior to antibiotics being given     Status: None (Preliminary result)   Collection Time: 03/31/15 11:06 PM  Result Value Ref Range Status   Specimen Description BLOOD LEFT ANTECUBITAL  Final   Special Requests   Final    BOTTLES DRAWN AEROBIC AND ANAEROBIC 3CC BLUE 5CC RED   Culture NO GROWTH 3 DAYS  Final  Report Status PENDING  Incomplete  Culture, blood (routine x 2) Call MD if unable to obtain prior to antibiotics being given     Status: None (Preliminary result)   Collection Time: 03/31/15 11:12 PM  Result Value Ref Range Status   Specimen Description BLOOD LEFT HAND  Final   Special Requests BOTTLES DRAWN AEROBIC ONLY 10CC  Final   Culture NO GROWTH 3 DAYS  Final   Report Status PENDING  Incomplete  Culture, sputum-assessment     Status: None   Collection Time: 04/01/15 12:31 AM  Result Value Ref Range Status   Specimen Description SPU  Final   Special Requests NONE  Final   Sputum evaluation   Final    THIS SPECIMEN IS ACCEPTABLE. RESPIRATORY CULTURE REPORT TO FOLLOW.   Report Status 04/01/2015 FINAL  Final  Culture, respiratory (NON-Expectorated)     Status: None   Collection Time: 04/01/15  12:31 AM  Result Value Ref Range Status   Specimen Description SPU  Final   Special Requests NONE  Final   Gram Stain   Final    ABUNDANT WBC PRESENT,BOTH PMN AND MONONUCLEAR RARE SQUAMOUS EPITHELIAL CELLS PRESENT FEW GRAM POSITIVE COCCI IN PAIRS IN CLUSTERS RARE GRAM POSITIVE RODS Performed at [facility]    Culture   Final    NORMAL OROPHARYNGEAL FLORA Performed at [facility]    Report Status 04/03/2015 FINAL  Final      Studies/Results: Dg Chest 2 View  04/03/2015   CLINICAL DATA:  Pneumonia.  Cough.  Shortness of breath.  EXAM: CHEST  2 VIEW  COMPARISON:  03/30/2015  FINDINGS: Increased bilateral lower lobe and right middle lobe airspace disease since prior study. Probable small right pleural effusion also noted. Heart size remains within normal limits. Low lung volumes again noted.  IMPRESSION: Increased bilateral lower lobe and right middle lobe airspace disease, and probable small right pleural effusion.   Electronically Signed   By: Earle Gell M.D.   On: 04/03/2015 12:59    Medications:  Scheduled: . azithromycin  500 mg Oral Q24H  . cefTRIAXone (ROCEPHIN)  IV  1 g Intravenous Q24H  . guaiFENesin  600 mg Oral BID  . heparin  5,000 Units Subcutaneous 3 times per day  . labetalol  200 mg Oral BID  . prenatal multivitamin  1 tablet Oral q morning - 10a  . valACYclovir  500 mg Oral BID   Continuous:   UJW:[id number], guaifenesin, ibuprofen, ipratropium-albuterol, phenol, traZODone  Assessment/Plan:  Principal Problem:   CAP (community acquired pneumonia) Active Problems:   Right lower lobe pneumonia   Fever   Essential hypertension   Dehydration    Community-acquired pneumonia Considering evidence for blood in sputum and pleuritic chest pain and slow improvement, we will proceed with CT scan of the chest. Chest x-ray from yesterday did suggest right-sided pleural effusion. This is most likely parapneumonic. Slow to improve but perhaps  slightly better this morning. Continue ceftriaxone and azithromycin. Continue mucinex. Urine for strep antigen negative. HIV is nonreactive. Urine for legionella is negative as well. Blood cultures are negative so far. Sputum cultures pending. Reportedly, her 3 month old daughter was sick recently.  Dehydration with mildly elevated creatinine Secondary to infection. Improved. Can cut back on IV fluids.  Hypokalemia This was repleted. Magnesium is normal.  History of recurrent genital herpes Currently on suppressive treatment. Continue Valtrex.  History of essential hypertension Continue with home medications. Blood pressure is reasonably well controlled.  DVT  Prophylaxis: Subcutaneous heparin    Code Status: Full code  Family Communication: Discussed with the patient  Disposition Plan: Continue current treatment for now. CT scan of chest today.  Follow-up Appointment?: Will need to follow-up with her primary care physician within a week after discharge. Needs repeat chest x-ray in about 6-8 weeks.   LOS: 4 days   [facility] Pager [phone] 04/04/2015, 8:48 AM  If 7PM-7AM, please contact night-coverage at [email or web address], password [facility]

## 2015-04-05 LAB — CULTURE, BLOOD (ROUTINE X 2)
Culture: NO GROWTH
Culture: NO GROWTH

## 2015-04-05 MED ORDER — CEFPODOXIME PROXETIL 200 MG PO TABS
200.0000 mg | ORAL_TABLET | Freq: Two times a day (BID) | ORAL | Status: DC
  Administered 2015-04-05: 200 mg via ORAL
  Filled 2015-04-05 (×2): qty 1

## 2015-04-05 MED ORDER — ALBUTEROL SULFATE HFA 108 (90 BASE) MCG/ACT IN AERS: 2.0000 | INHALATION_SPRAY | RESPIRATORY_TRACT | Status: DC | PRN

## 2015-04-05 MED ORDER — GUAIFENESIN 100 MG/5ML PO SYRP: 200.0000 mg | ORAL_SOLUTION | ORAL | Status: DC | PRN

## 2015-04-05 MED ORDER — AZITHROMYCIN 500 MG PO TABS: 500.0000 mg | ORAL_TABLET | Freq: Every day | ORAL | Status: DC

## 2015-04-05 MED ORDER — CEFPODOXIME PROXETIL 200 MG PO TABS: 200.0000 mg | ORAL_TABLET | Freq: Two times a day (BID) | ORAL | Status: DC

## 2015-04-05 MED ORDER — GUAIFENESIN ER 1200 MG PO TB12: 1.0000 | ORAL_TABLET | Freq: Two times a day (BID) | ORAL | Status: DC

## 2015-04-05 NOTE — Progress Notes (Signed)
IV removed per order. Discharge instructions and prescriptions given and explained with teach back. Discharged via wheelchair to sister's care with NT present to escort out.

## 2015-04-05 NOTE — Discharge Summary (Signed)
[facility]  Physician Discharge Summary   Patient ID: Ashley [name] MRN: [id number] DOB/AGE: Feb 21, 1976 39 y.o.  Admit date: 03/31/2015 Discharge date: 04/05/2015  PCP: Luanne Bras, FNP  DISCHARGE DIAGNOSES:  Principal Problem:   CAP (community acquired pneumonia) Active Problems:   Right lower lobe pneumonia   Fever   Essential hypertension   Dehydration   RECOMMENDATIONS FOR OUTPATIENT FOLLOW UP: 1. Close follow-up with PCP. 2. Needs repeat chest x-ray in 6-8 weeks   DISCHARGE CONDITION: fair  Diet recommendation: Low sodium  Filed Weights   03/31/15 2148  Weight: 132.45 kg (292 lb)    INITIAL HISTORY: 39 year old African-American female presented with fever, cough, tachycardia. Chest x-ray showed right lower lobe pneumonia. She failed outpatient treatment and was hospitalized for further management.   HOSPITAL COURSE:   Community-acquired pneumonia Patient was admitted to the hospital and started on intravenous ceftriaxone and azithromycin. Patient was very slow to improve. She is coughing up yellowish sputum. Chest x-ray was repeated with suggested right-sided pleural effusion. Then she started coughing up blood tinged sputum. This prompted a CT scan of her chest. Bilateral pneumonia was noted, but only a small pleural effusion was appreciated. This is most likely parapneumonic. No evidence for empyema. Patient was continued on her IV antibiotics. She slowly started improving. He is much better today. Saturating normal on room air. Wishes to go home. She was transitioned to oral antibiotics. Continue mucinex. Urine for strep antigen negative. HIV is nonreactive. Urine for legionella is negative as well. Blood cultures are negative. Sputum cultures negative as well. Reportedly, her 50 month old daughter was sick recently.  Dehydration with mildly elevated creatinine Secondary to infection. Improved. He was given IV fluids.  Hypokalemia This  was repleted. Magnesium is normal.  History of recurrent genital herpes Currently on suppressive treatment. Continue Valtrex.  History of essential hypertension Continue with home medications. Blood pressure is reasonably well controlled.  Overall, patient has been slow to improve. She may continue the rest of the treatment at home. She is safe for discharge. She has been told to follow-up with her primary care physician within one week. She will need a repeat chest x-ray in 6-8 weeks. May need one sooner if she does not improve as anticipated.   PERTINENT LABS:  The results of significant diagnostics from this hospitalization (including imaging, microbiology, ancillary and laboratory) are listed below for reference.    Microbiology: Recent Results (from the past 240 hour(s))  Culture, blood (routine x 2) Call MD if unable to obtain prior to antibiotics being given     Status: None   Collection Time: 03/31/15 11:06 PM  Result Value Ref Range Status   Specimen Description BLOOD LEFT ANTECUBITAL  Final   Special Requests   Final    BOTTLES DRAWN AEROBIC AND ANAEROBIC 3CC BLUE 5CC RED   Culture NO GROWTH 5 DAYS  Final   Report Status 04/05/2015 FINAL  Final  Culture, blood (routine x 2) Call MD if unable to obtain prior to antibiotics being given     Status: None   Collection Time: 03/31/15 11:12 PM  Result Value Ref Range Status   Specimen Description BLOOD LEFT HAND  Final   Special Requests BOTTLES DRAWN AEROBIC ONLY 10CC  Final   Culture NO GROWTH 5 DAYS  Final   Report Status 04/05/2015 FINAL  Final  Culture, sputum-assessment     Status: None   Collection Time: 04/01/15 12:31 AM  Result Value Ref Range Status  Specimen Description SPU  Final   Special Requests NONE  Final   Sputum evaluation   Final    THIS SPECIMEN IS ACCEPTABLE. RESPIRATORY CULTURE REPORT TO FOLLOW.   Report Status 04/01/2015 FINAL  Final  Culture, respiratory (NON-Expectorated)     Status: None    Collection Time: 04/01/15 12:31 AM  Result Value Ref Range Status   Specimen Description SPU  Final   Special Requests NONE  Final   Gram Stain   Final    ABUNDANT WBC PRESENT,BOTH PMN AND MONONUCLEAR RARE SQUAMOUS EPITHELIAL CELLS PRESENT FEW GRAM POSITIVE COCCI IN PAIRS IN CLUSTERS RARE GRAM POSITIVE RODS Performed at [facility]    Culture   Final    NORMAL OROPHARYNGEAL FLORA Performed at [facility]    Report Status 04/03/2015 FINAL  Final     Labs: Basic Metabolic Panel:  Recent Labs Lab 03/31/15 2306 04/01/15 1440 04/03/15 0344 04/04/15 0353  NA 133*  --  139 138  K 3.0*  --  3.8 4.2  CL 97*  --  108 107  CO2 27  --  25 24  GLUCOSE 151*  --  102* 95  BUN 13  --  <5* <5*  CREATININE 1.33*  --  0.84 0.68  CALCIUM 8.0*  --  8.0* 8.4*  MG  --  2.0  --   --    Liver Function Tests:  Recent Labs Lab 04/03/15 0344  AST 28  ALT 25  ALKPHOS 56  BILITOT 0.2*  PROT 5.9*  ALBUMIN 2.5*   CBC:  Recent Labs Lab 03/30/15 1157 03/31/15 2014 04/03/15 0344 04/04/15 0353  WBC 10.4 10.3* 5.0 4.8  HGB 14.0 14.7 10.9* 11.4*  HCT 41.4 46.9 33.1* 34.9*  MCV 79.5 77.9* 79.8 78.4  PLT 164  --  152 182    IMAGING STUDIES Dg Chest 2 View  04/03/2015   CLINICAL DATA:  Pneumonia.  Cough.  Shortness of breath.  EXAM: CHEST  2 VIEW  COMPARISON:  03/30/2015  FINDINGS: Increased bilateral lower lobe and right middle lobe airspace disease since prior study. Probable small right pleural effusion also noted. Heart size remains within normal limits. Low lung volumes again noted.  IMPRESSION: Increased bilateral lower lobe and right middle lobe airspace disease, and probable small right pleural effusion.   Electronically Signed   By: Earle Gell M.D.   On: 04/03/2015 12:59   Dg Chest 2 View  03/30/2015   CLINICAL DATA:  Patient with productive cough and fever.  EXAM: CHEST  2 VIEW  COMPARISON:  Chest radiograph 11/06/2011; CT 09/16/2013  FINDINGS: Normal cardiac  and mediastinal contours. Interval development of right lower lung pulmonary consolidation. No pleural effusion or pneumothorax. Regional skeleton is unremarkable.  IMPRESSION: Interval development of right lower lung pulmonary consolidation compatible with pneumonia. Followup PA and lateral chest X-ray is recommended in 3-4 weeks following trial of antibiotic therapy to ensure resolution and exclude underlying malignancy.   Electronically Signed   By: Lovey Newcomer M.D.   On: 03/30/2015 14:51   Ct Chest W Contrast  04/04/2015   CLINICAL DATA:  Cough, congestion, hemoptysis and pneumonia. Bilateral infiltrates by chest x-ray with probable right pleural effusion.  EXAM: CT CHEST WITH CONTRAST  TECHNIQUE: Multidetector CT imaging of the chest was performed during intravenous contrast administration.  CONTRAST:  37mL OMNIPAQUE IOHEXOL 300 MG/ML  SOLN  COMPARISON:  Chest x-ray on 04/03/2015 and 03/30/2015  FINDINGS: Multilobar pneumonia noted bilaterally. The most significant  airspace consolidation is in the right middle lobe with air bronchograms present. There also is extensive airspace disease in the right lower lobe with an associated small right pleural effusion. This pleural effusion appears to be dependent and of low density without evidence of loculation or peripheral enhancement. Posterior left lower lobe airspace infiltrate present. No evidence of airway obstruction.  No enlarged lymph nodes are seen. No evidence of pericardial fluid. The heart size is normal. The thoracic aorta and pulmonary arteries are unremarkable.  Bony structures are unremarkable. Visualized upper abdominal structures are within normal limits.  IMPRESSION: Bilateral multilobar pneumonia, most prominently within the right middle lobe and right lower lobe. There also is infiltrate in the posterior left lower lobe. There is an associated small right pleural effusion without CT evidence to suggest overt empyema.   Electronically Signed    By: Aletta Edouard M.D.   On: 04/04/2015 10:29    DISCHARGE EXAMINATION: Filed Vitals:   04/04/15 1400 04/04/15 2119 04/05/15 0553 04/05/15 1315  BP: 135/72 143/78 141/90 137/76  Pulse: 85 73 80 79  Temp: 99 F (37.2 C) 98.4 F (36.9 C) 98.4 F (36.9 C) 98.5 F (36.9 C)  TempSrc: Oral Oral Oral Oral  Resp: 18 17  18   Height:      Weight:      SpO2: 95% 92% 93% 95%   General appearance: alert, cooperative and no distress Resp: Improved air entry bilaterally. Has crackles at the bases, right more the left. But no rhonchi or wheezing. Cardio: regular rate and rhythm, S1, S2 normal, no murmur, click, rub or gallop GI: soft, non-tender; bowel sounds normal; no masses,  no organomegaly Extremities: extremities normal, atraumatic, no cyanosis or edema  DISPOSITION: Home  Discharge Instructions    Call MD for:  difficulty breathing, headache or visual disturbances    Complete by:  As directed      Call MD for:  extreme fatigue    Complete by:  As directed      Call MD for:  persistant dizziness or light-headedness    Complete by:  As directed      Call MD for:  persistant nausea and vomiting    Complete by:  As directed      Call MD for:  severe uncontrolled pain    Complete by:  As directed      Call MD for:  temperature >100.4    Complete by:  As directed      Diet general    Complete by:  As directed      Discharge instructions    Complete by:  As directed   Please see your PCP as instructed. You will need a repeat chest x ray in 6-8 weeks. Seek attention if your symptoms get worse.  You were cared for by a hospitalist during your hospital stay. If you have any questions about your discharge medications or the care you received while you were in the hospital after you are discharged, you can call the unit and asked to speak with the hospitalist on call if the hospitalist that took care of you is not available. Once you are discharged, your primary care physician will handle  any further medical issues. Please note that NO REFILLS for any discharge medications will be authorized once you are discharged, as it is imperative that you return to your primary care physician (or establish a relationship with a primary care physician if you do not have one) for your aftercare needs  so that they can reassess your need for medications and monitor your lab values. If you do not have a primary care physician, you can call [phone] for a physician referral.     Increase activity slowly    Complete by:  As directed            ALLERGIES:  Allergies  Allergen Reactions  . Codeine Itching and Nausea Only  . Cymbalta [Duloxetine Hcl] Other (See Comments)    Altered mental status     Current Discharge Medication List    START taking these medications   Details  azithromycin (ZITHROMAX) 500 MG tablet Take 1 tablet (500 mg total) by mouth daily. For 4 more days Qty: 4 tablet, Refills: 0    cefpodoxime (VANTIN) 200 MG tablet Take 1 tablet (200 mg total) by mouth every 12 (twelve) hours. For 6 more days starting tonight Qty: 12 tablet, Refills: 0    guaifenesin (ROBITUSSIN) 100 MG/5ML syrup Take 10 mLs (200 mg total) by mouth every 4 (four) hours as needed for congestion. Qty: 120 mL, Refills: 0      CONTINUE these medications which have CHANGED   Details  albuterol (PROVENTIL HFA;VENTOLIN HFA) 108 (90 BASE) MCG/ACT inhaler Inhale 2 puffs into the lungs every 4 (four) hours as needed for wheezing or shortness of breath (cough, shortness of breath or wheezing.). Qty: 1 Inhaler, Refills: 0    Guaifenesin (MUCINEX MAXIMUM STRENGTH) 1200 MG TB12 Take 1 tablet (1,200 mg total) by mouth 2 (two) times daily. Qty: 28 tablet, Refills: 1   Associated Diagnoses: CAP (community acquired pneumonia)      CONTINUE these medications which have NOT CHANGED   Details  chlorpheniramine-HYDROcodone (TUSSIONEX PENNKINETIC ER) 10-8 MG/5ML SUER Take 5 mLs by mouth at bedtime as needed for  cough. Qty: 60 mL, Refills: 0   Associated Diagnoses: CAP (community acquired pneumonia); Cough    HYDROcodone-acetaminophen (NORCO) 10-325 MG per tablet Take 1 tablet by mouth every 6 (six) hours as needed. for pain Refills: 0    lisinopril-hydrochlorothiazide (PRINZIDE,ZESTORETIC) 10-12.5 MG per tablet Take 1 tablet by mouth daily.    Prenatal Vit-Fe Fumarate-FA (PRENATAL MULTIVITAMIN) TABS tablet Take 1 tablet by mouth every morning.     valACYclovir (VALTREX) 500 MG tablet Take 500 mg by mouth 2 (two) times daily as needed (for outbreaks).       STOP taking these medications     ibuprofen (ADVIL,MOTRIN) 800 MG tablet      labetalol (NORMODYNE) 200 MG tablet        Follow-up Information    Schedule an appointment as soon as possible for a visit with [place].   Specialty:  Family Medicine   Why:  before end of week. You will need a CXR in 6-8 weeks.   Contact information:   [address] [place]. [place] [address] [phone]       TOTAL DISCHARGE TIME: 35 mins  [facility] Pager [phone]  04/05/2015, 2:54 PM

## 2015-04-05 NOTE — Discharge Instructions (Signed)

## 2015-04-10 ENCOUNTER — Ambulatory Visit (INDEPENDENT_AMBULATORY_CARE_PROVIDER_SITE_OTHER): Payer: 59

## 2015-04-10 ENCOUNTER — Ambulatory Visit (INDEPENDENT_AMBULATORY_CARE_PROVIDER_SITE_OTHER): Payer: 59 | Admitting: Family Medicine

## 2015-04-10 VITALS — BP 114/78 | HR 88 | Temp 98.2°F | Resp 16 | Ht 72.0 in | Wt 287.0 lb

## 2015-04-10 DIAGNOSIS — M79675 Pain in left toe(s): Secondary | ICD-10-CM

## 2015-04-10 DIAGNOSIS — S99922A Unspecified injury of left foot, initial encounter: Secondary | ICD-10-CM | POA: Diagnosis not present

## 2015-04-10 NOTE — Patient Instructions (Signed)
Wear the buddy tape to help keep the toe stiff  Advise using very firm soled shoes, such as a postop sandal  Apply ice for about 10 or 15 minutes several times daily for the next few days   No follow-up should be needed if it is doing well, but if it is giving him more problems or continued to hurt after 3 or 4 weeks come back for a recheck  Take ibuprofen 600-800 mg 3 times daily as needed for pain

## 2015-04-10 NOTE — Progress Notes (Signed)
  Subjective:  Patient ID: Ashley [name], female    DOB: 1976-06-20  Age: 39 y.o. MRN: [id number]  39 year old lady who stubbed her left third toe on the leg to the sectional sofa yesterday. It continues to hurt quite a lot. Objective:   Mild erythema, pain or significant tenderness in the left third toe primarily around the PIP joint area.  Assessment & Plan:   Assessment: Toe pain Toe injury  initial encounter  Plan: [facility] reading (PRIMARY) by   Dr. Burnard Leigh fracture proximal aspect middle phalanx third toe. Has a hand in the second toe. Deformity of the fourth toe from old surgery.  Showed the patient the x-ray. Explained the problem, and told her that it will heal with time. Follow-up is not needed unless pain is continued to persist.   Patient Instructions  Wear the buddy tape to help keep the toe stiff  Advise using very firm soled shoes, such as a postop sandal  Apply ice for about 10 or 15 minutes several times daily for the next few days   No follow-up should be needed if it is doing well, but if it is giving him more problems or continued to hurt after 3 or 4 weeks come back for a recheck  Take ibuprofen 600-800 mg 3 times daily as needed for pain     Savva Beamer, MD 04/10/2015

## 2015-04-11 ENCOUNTER — Telehealth: Payer: Self-pay | Admitting: Radiology

## 2015-04-11 NOTE — Telephone Encounter (Signed)
At the patient request I made a copy of her x-ray of Chest (7-10 and 7-14,2016 )  and Lt 3-rd toe xray from 04-10-2015. I gave the CD to MR.

## 2015-04-14 NOTE — Telephone Encounter (Signed)
Patient is requesting a copy of her bill and letter stating she was given a boot/shoe.   [phone] (M)

## 2015-04-15 NOTE — Telephone Encounter (Signed)
Ashley [name] can someone help?

## 2015-06-01 ENCOUNTER — Ambulatory Visit (INDEPENDENT_AMBULATORY_CARE_PROVIDER_SITE_OTHER): Payer: 59 | Admitting: Internal Medicine

## 2015-06-01 VITALS — BP 124/76 | HR 92 | Temp 98.0°F | Resp 18 | Ht 72.0 in | Wt 290.0 lb

## 2015-06-01 DIAGNOSIS — H109 Unspecified conjunctivitis: Secondary | ICD-10-CM | POA: Diagnosis not present

## 2015-06-01 MED ORDER — OFLOXACIN 0.3 % OP SOLN: 1.0000 [drp] | OPHTHALMIC | Status: DC

## 2015-06-01 NOTE — Patient Instructions (Signed)

## 2015-06-01 NOTE — Progress Notes (Signed)
Patient ID: Ashley [name], female   DOB: December 05, 1975, 39 y.o.   MRN: [id number]   06/01/2015 at 12:48 PM  Ashley [name] / DOB: 19-Sep-1976 / MRN: [id number]  Problem list reviewed and updated by me where necessary.   SUBJECTIVE  Ashley [name] is a 39 y.o. ill appearing female presenting for the chief complaint of left eye reddness with purulent discharge, no cough , no head or chest congestion, no eye pain or vision change. On chart review she had serious bilateral pneumonia hospitalized in July. All reports reviewed..     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    Medications reviewed and updated by myself where necessary, and exist elsewhere in the encounter.   Ashley [name] is allergic to codeine and cymbalta. She  reports that she has never smoked. She has never used smokeless tobacco. She reports that she does not drink alcohol or use illicit drugs. She  reports that she does not engage in sexual activity. 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  Her family history includes Cancer in her father, maternal grandmother, and paternal grandmother; Diabetes in her father and sister; Heart disease in her father; Kidney disease in her father.  Review of Systems  Constitutional: Positive for malaise/fatigue. Negative for fever  and chills.  HENT: Negative for congestion, ear pain and sore throat.   Eyes: Positive for discharge and redness. Negative for blurred vision, photophobia and pain.  Respiratory: Negative for cough, shortness of breath and wheezing.   Cardiovascular: Negative for chest pain.  Gastrointestinal: Negative for nausea.  Skin: Negative for rash.  Neurological: Negative for dizziness and headaches.    OBJECTIVE  Her  height is 6' (1.829 m) and weight is 290 lb (131.543 kg). Her oral temperature is 98 F (36.7 C). Her blood pressure is 124/76 and her pulse is 92. Her respiration is 18 and oxygen saturation is 98%.  The patient's body mass index is 39.32 kg/(m^2).  Physical Exam  Constitutional: She is oriented to person, place, and time. She appears well-developed and well-nourished. No distress.  HENT:  Head: Normocephalic.  Right Ear: External ear normal.  Left Ear: External ear normal.  Nose: Nose normal.  Mouth/Throat: Oropharynx is clear and moist.  Eyes: EOM are normal. Pupils are equal, round, and reactive to light. Right eye exhibits no discharge. Left eye exhibits discharge and exudate. No foreign body present in the left eye. Right conjunctiva is not injected. Left conjunctiva is injected. Left conjunctiva has no hemorrhage.    Neck: Normal range of motion. Neck supple.  Cardiovascular: Normal rate.   Respiratory: Effort normal and breath sounds normal.  Neurological: She is alert and oriented to person, place, and time. She exhibits normal muscle tone. Coordination normal.  Skin: No rash noted. She is not diaphoretic.  Psychiatric: She has a normal mood and affect.    No results found for this or any previous visit (from the past 24 hour(s)).  ASSESSMENT & PLAN  Ashley [name] was seen today for eye problem.  Diagnoses and all orders  for this visit:  Conjunctivitis of left eye -     ofloxacin (OCUFLOX) 0.3 % ophthalmic solution; Place 1 drop into the left eye every 4 (four)  hours.

## 2015-06-04 ENCOUNTER — Other Ambulatory Visit: Payer: Self-pay | Admitting: Physician Assistant

## 2015-06-04 ENCOUNTER — Ambulatory Visit
Admission: RE | Admit: 2015-06-04 | Discharge: 2015-06-04 | Disposition: A | Discharge status: Home / Self Care | Payer: 59 | Source: Ambulatory Visit | Attending: Physician Assistant | Admitting: Physician Assistant

## 2015-06-04 DIAGNOSIS — J189 Pneumonia, unspecified organism: Secondary | ICD-10-CM

## 2015-06-25 ENCOUNTER — Ambulatory Visit (INDEPENDENT_AMBULATORY_CARE_PROVIDER_SITE_OTHER): Payer: 59 | Admitting: Urgent Care

## 2015-06-25 VITALS — BP 120/78 | HR 83 | Temp 98.6°F | Resp 18 | Ht 73.0 in | Wt 291.0 lb

## 2015-06-25 DIAGNOSIS — R058 Other specified cough: Secondary | ICD-10-CM | POA: Insufficient documentation

## 2015-06-25 DIAGNOSIS — R0981 Nasal congestion: Secondary | ICD-10-CM | POA: Diagnosis not present

## 2015-06-25 DIAGNOSIS — R059 Cough, unspecified: Secondary | ICD-10-CM | POA: Insufficient documentation

## 2015-06-25 DIAGNOSIS — J302 Other seasonal allergic rhinitis: Secondary | ICD-10-CM | POA: Diagnosis not present

## 2015-06-25 DIAGNOSIS — R05 Cough: Secondary | ICD-10-CM

## 2015-06-25 DIAGNOSIS — I10 Essential (primary) hypertension: Secondary | ICD-10-CM | POA: Diagnosis not present

## 2015-06-25 DIAGNOSIS — J329 Chronic sinusitis, unspecified: Secondary | ICD-10-CM | POA: Diagnosis not present

## 2015-06-25 MED ORDER — LOSARTAN POTASSIUM-HCTZ 50-12.5 MG PO TABS: 1.0000 | ORAL_TABLET | Freq: Every day | ORAL | Status: DC

## 2015-06-25 MED ORDER — HYDROCODONE-HOMATROPINE 5-1.5 MG/5ML PO SYRP: 5.0000 mL | ORAL_SOLUTION | Freq: Every evening | ORAL | Status: DC | PRN

## 2015-06-25 MED ORDER — CETIRIZINE HCL 10 MG PO TABS: 10.0000 mg | ORAL_TABLET | Freq: Every day | ORAL | Status: DC

## 2015-06-25 MED ORDER — AZITHROMYCIN 250 MG PO TABS: ORAL_TABLET | ORAL | Status: DC

## 2015-06-25 MED ORDER — PSEUDOEPHEDRINE HCL ER 120 MG PO TB12: 120.0000 mg | ORAL_TABLET | Freq: Two times a day (BID) | ORAL | Status: DC

## 2015-06-25 NOTE — Progress Notes (Signed)
MRN: [id number] DOB: Jun 25, 1976  Subjective:   Ashley [name] is a 39 y.o. female presenting for chief complaint of Cough; chest congestion; and Nasal Congestion  Reports 3 day history of productive cough without hemoptysis, some wheezing, mild nausea, nasal congestion, irritated throat. Has tried Robitussin, Thera-flu and NyQuil with minimal relief. Of note, patient was seen in early 03/2015 and admitted for pneumonia. Repeat x-ray in 06/04/2015 showed resolution of her pneumonia. Admits history of seasonal allergies, gets allergy shots for this. Denies fever, chest pain, shob, n/v, abdominal pain. Denies history of asthma. Denies smoking cigarettes. Denies any other aggravating or relieving factors, no other questions or concerns.  Ashley has a current medication list which includes the following prescription(s): lisinopril-hydrochlorothiazide, metronidazole, and prenatal multivitamin. Also is allergic to codeine and cymbalta.  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  Objective:   Vitals: BP 120/78 mmHg  Pulse 83  Temp(Src) 98.6 F (37 C) (Oral)  Resp 18  Ht 6\' 1"  (1.854 m)  Wt 291 lb (131.997 kg)  BMI 38.40 kg/m2  SpO2 98%  LMP 06/09/2015  BP Readings from Last 3 Encounters:  06/25/15 120/78  06/01/15  124/76  04/10/15 114/78   Physical Exam  Constitutional: She is oriented to person, place, and time. She appears well-developed and well-nourished.  HENT:  TM's intact bilaterally, no effusions or erythema. Nares patent, nasal turbinates with slight erythema, nasal passages patent. Sinus tenderness throughout, R>L. Oropharynx clear, mucous membranes moist, dentition in good repair.  Eyes: Conjunctivae are normal. Right eye exhibits no discharge. Left eye exhibits no discharge. No scleral icterus.  Neck: Normal range of motion. Neck supple.  Cardiovascular: Normal rate, regular rhythm and intact distal pulses.  Exam reveals no gallop and no friction rub.   No murmur heard. Pulmonary/Chest: No respiratory distress. She has wheezes (laryngospasm). She has no rales.  Musculoskeletal: She exhibits no edema.  Lymphadenopathy:    She has no cervical adenopathy.  Neurological: She is alert and oriented to person, place, and time.  Skin: Skin is warm and dry. No rash noted. No erythema. No pallor.   Assessment and Plan :   1. Sinusitis, unspecified chronicity, unspecified location 2. Nasal congestion 3. Cough - Patient declined x-ray today. Physical exam findings consistent with sinusitis. Patient admits that she is also undergoing a lot of stress due to the recent loss of her father and her own health issues. She agreed to manage symptoms conservatively for 3-4 days and will start Azithromycin if no improvement in her symptoms. Hycodan for cough. Patient refused Tessalon perles due to previous side effects. - RTC if azithromycin does not resolve sinusitis.  4. Seasonal allergies - Zyrtec and Sudafed as tolerated for nasal congestion. Continue management for allergies with PCP.  5. Essential hypertension - Switched patient from lis-HCT due to complaint of side effect including cough, hair loss. Start losartan-HCT 50mg -12.5mg . Recheck in 4 weeks.  Ashley Eagles, PA-C [facility] [phone] 06/25/2015 11:00 AM

## 2015-06-25 NOTE — Patient Instructions (Signed)

## 2015-08-17 ENCOUNTER — Ambulatory Visit (INDEPENDENT_AMBULATORY_CARE_PROVIDER_SITE_OTHER): Payer: 59

## 2015-08-17 ENCOUNTER — Ambulatory Visit (INDEPENDENT_AMBULATORY_CARE_PROVIDER_SITE_OTHER): Payer: 59 | Admitting: Internal Medicine

## 2015-08-17 VITALS — BP 138/74 | HR 82 | Temp 98.7°F | Resp 16 | Ht 73.0 in | Wt 299.0 lb

## 2015-08-17 DIAGNOSIS — M25551 Pain in right hip: Secondary | ICD-10-CM

## 2015-08-17 MED ORDER — HYDROCODONE-ACETAMINOPHEN 5-325 MG PO TABS: 1.0000 | ORAL_TABLET | Freq: Every evening | ORAL | Status: DC | PRN

## 2015-08-17 MED ORDER — MELOXICAM 15 MG PO TABS: 15.0000 mg | ORAL_TABLET | Freq: Every day | ORAL | Status: DC

## 2015-08-17 MED ORDER — CYCLOBENZAPRINE HCL 10 MG PO TABS: 10.0000 mg | ORAL_TABLET | Freq: Every day | ORAL | Status: DC

## 2015-08-17 NOTE — Progress Notes (Signed)
Subjective:  This chart was scribed for Tami Lin, MD by Thea Alken, ED Scribe. This patient was seen in room 13 and the patient's care was started at 1:10 PM.   Patient ID: Ashley [name], female    DOB: 27-Jan-1976, 39 y.o.   MRN: [id number]  HPI   Chief Complaint  Patient presents with  . Hip Injury    Right hip pain x 1 day - was moving things into storage and thinks she may have injuried it then.    HPI Comments: Ashley [name] is a 39 y.o. female who presents to the [facility] complaining of right hip pain. Pt's father recently passed away and has moving furniture into storage. States while moving furniture she step incorrectly causing onset of pain. She has pain with climbing steps and getting in out of her car. She has hx of 3 bulging disc in her back but declined surgery.   Past Medical History  Diagnosis Date  . PCOS (polycystic ovarian syndrome)   . Arthritis     knees, back  . 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 Hypertension    Allergies  Allergen Reactions  . Codeine Itching and Nausea Only  . Cymbalta [Duloxetine Hcl] Other (See Comments)    Altered mental status   Prior to Admission medications   Medication Sig Start Date End Date Taking? Authorizing Provider  cetirizine (ZYRTEC) 10 MG tablet Take 1 tablet (10 mg total) by mouth daily. 06/25/15  Yes Jaynee Eagles, PA-C  losartan-hydrochlorothiazide (HYZAAR) 50-12.5 MG tablet Take 1 tablet by mouth daily. 06/25/15  Yes Jaynee Eagles, PA-C   Review of Systems  Musculoskeletal: Positive for arthralgias and gait problem. Negative for back pain.  Neurological: Negative for weakness and numbness.   Objective:   Physical Exam  Constitutional: She is oriented to person, place, and time. She appears well-developed and well-nourished. No distress.  HENT:  Head: Normocephalic and atraumatic.  Eyes: Conjunctivae and EOM are normal.  Neck: Neck supple.    Cardiovascular: Normal rate.   Pulmonary/Chest: Effort normal.  Musculoskeletal: Normal range of motion.  Right hip TTP laterally and has pain with all ROM. No tenderness in lumbar area. No radicular symptoms.  Neurological: She is alert and oriented to person, place, and time.  Skin: Skin is warm and dry.  Psychiatric: She has a normal mood and affect. Her behavior is normal.  Nursing note and vitals reviewed.  Filed Vitals:   08/17/15 1305  BP: 138/74  Pulse: 82  Temp: 98.7 F (37.1 C)  TempSrc: Oral  Resp: 16  Height: 6\' 1"  (1.854 m)  Weight: 299 lb (135.626 kg)  SpO2: 99%   [facility] reading (PRIMARY) by Dr. Laney Pastor No bony injury to the hip.      Assessment & Plan:   1. Pain in right hip    secondary to the stepdown injury with articular inflammation.   If she does not respond to medications plus range of motion over the next 2 to 3 weeks we will refer her to orthopedics for potential MRI and/or hip injection    Meds ordered this encounter  Medications  . HYDROcodone-acetaminophen (NORCO/VICODIN) 5-325 MG tablet    Sig: Take 1 tablet by mouth at bedtime as needed for moderate pain.    Dispense:  10 tablet    Refill:  0  . meloxicam (MOBIC) 15 MG tablet    Sig: Take 1 tablet (15 mg total) by mouth daily.  Dispense:  30 tablet    Refill:  0  . cyclobenzaprine (FLEXERIL) 10 MG tablet    Sig: Take 1 tablet (10 mg total) by mouth at bedtime.    Dispense:  30 tablet    Refill:  0    By signing my name below, I, Raven Small, attest that this documentation has been prepared under the direction and in the presence of Tami Lin, MD.  Electronically Signed: Thea Alken, ED Scribe. 08/17/2015. 1:47 PM.  I have completed the patient encounter in its entirety as documented by the scribe, with editing by me where necessary. Alenna Russell P. Laney Pastor, M.D.

## 2015-08-19 ENCOUNTER — Other Ambulatory Visit: Payer: Self-pay | Admitting: Physical Medicine and Rehabilitation

## 2015-08-19 DIAGNOSIS — M169 Osteoarthritis of hip, unspecified: Principal | ICD-10-CM

## 2015-08-19 DIAGNOSIS — M24159 Other articular cartilage disorders, unspecified hip: Secondary | ICD-10-CM

## 2015-09-04 ENCOUNTER — Ambulatory Visit
Admission: RE | Admit: 2015-09-04 | Discharge: 2015-09-04 | Disposition: A | Discharge status: Home / Self Care | Payer: 59 | Source: Ambulatory Visit | Attending: Physical Medicine and Rehabilitation | Admitting: Physical Medicine and Rehabilitation

## 2015-09-04 DIAGNOSIS — M169 Osteoarthritis of hip, unspecified: Principal | ICD-10-CM

## 2015-09-04 DIAGNOSIS — M24159 Other articular cartilage disorders, unspecified hip: Secondary | ICD-10-CM

## 2015-09-04 MED ORDER — METHYLPREDNISOLONE ACETATE 40 MG/ML INJ SUSP (RADIOLOG
120.0000 mg | Freq: Once | INTRAMUSCULAR | Status: AC
  Administered 2015-09-04: 120 mg via INTRA_ARTICULAR

## 2015-09-04 MED ORDER — IOHEXOL 180 MG/ML  SOLN
15.0000 mL | Freq: Once | INTRAMUSCULAR | Status: AC | PRN
  Administered 2015-09-04: 15 mL via INTRA_ARTICULAR

## 2015-11-30 ENCOUNTER — Ambulatory Visit (INDEPENDENT_AMBULATORY_CARE_PROVIDER_SITE_OTHER): Payer: 59 | Admitting: Emergency Medicine

## 2015-11-30 VITALS — BP 130/90 | HR 88 | Temp 98.2°F | Resp 18 | Ht 73.0 in | Wt 296.4 lb

## 2015-11-30 DIAGNOSIS — M79651 Pain in right thigh: Secondary | ICD-10-CM

## 2015-11-30 DIAGNOSIS — S76311A Strain of muscle, fascia and tendon of the posterior muscle group at thigh level, right thigh, initial encounter: Secondary | ICD-10-CM

## 2015-11-30 NOTE — Progress Notes (Signed)
By signing my name below, I, [name], attest that this documentation has been prepared under the direction and in the presence of Nena Jordan, MD. Electronically Signed: [name], ER Scribe. 11/30/2015. 4:15 PM.  Chief Complaint:  Chief Complaint  Patient presents with  . Leg Pain    right leg pain (posterior) since Friday evening. Has been doing spin classes & helping her mother move    HPI: Ashley [name] is a 40 y.o. female she has a past hx of blood, DJD, bilateral knee replacement, and HTN clot who reports to [facility] today complaining of posterior right thigh pain for the last week after doing spin classes for a few days and helping her mother move. She has residual swelling in her bilateral knees due to bilateral knee replacement unchanged from baseline.    Past Medical History  Diagnosis Date  . PCOS (polycystic ovarian syndrome)   . Arthritis     knees, back  . 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 Hypertension    Past Surgical History  Procedure Laterality Date  . Ankle surgery      x 2  . Dilation and evacuation  04/02/2011    Procedure: DILATATION AND EVACUATION (D&E);  Surgeon: Luz Lex, MD;  Location: [facility];  Service: Gynecology;  Laterality: N/A;  dvt left mid thigh  . Shoulder arthroscopy Right   . 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 Hysteroscopy w/d&c  07/17/2010    with exc. endometrial polyps  . Total knee arthroplasty Right 08/26/2008  . Endoscopic plantar fasciotomy Left 02/01/2002  . Toe surgery Left 01/10/2003    claw toe correction 2nd, 3rd, 4th toes  . Knee arthroscopy Right 03/07/2003; 01/14/2005; 08/31/2006  . Knee arthroscopy Left 03/21/2003; 11/26/2004  . Carpal tunnel release Left 07/04/2014    Procedure: LEFT CARPAL TUNNEL RELEASE;  Surgeon: Ninetta Lights, MD;  Location: [place];  Service: Orthopedics;  Laterality: Left;  . Dorsal compartment release Left 07/04/2014    Procedure: LEFT  DEQUERVAINS;  Surgeon: Ninetta Lights, MD;  Location: [place];  Service: Orthopedics;  Laterality: Left;  . Carpal tunnel release Right 08/08/2014    Procedure: RIGHT CARPAL TUNNEL RELEASE;  Surgeon: Ninetta Lights, MD;  Location: [place];  Service: Orthopedics;  Laterality: Right;   Social History   Social History  . Marital Status: Legally Separated    Spouse Name: N/A  . Number of Children: N/A  . Years of Education: N/A   Social History Main Topics  . Smoking status: Never Smoker   . Smokeless tobacco: Never Used  . Alcohol Use: No  . Drug Use: No  . Sexual Activity: No   Other Topics Concern  . None   Social History Narrative   Family History  Problem Relation Age of Onset  . Diabetes Father   . Cancer Father     COLON  . Heart disease Father   . Kidney disease Father     dialysis  . Diabetes Sister   . Cancer Maternal Grandmother     UTERINE  . Cancer Paternal Grandmother     breast   Allergies  Allergen Reactions  . Codeine Itching and Nausea Only  . Cymbalta [Duloxetine Hcl] Other (See Comments)    Altered mental status   Prior to Admission medications   Medication Sig Start Date End Date Taking? Authorizing Provider  cetirizine (ZYRTEC) 10 MG tablet Take  1 tablet (10 mg total) by mouth daily. 06/25/15  Yes Jaynee Eagles, PA-C  HYDROcodone-acetaminophen (NORCO/VICODIN) 5-325 MG tablet Take 1 tablet by mouth at bedtime as needed for moderate pain. 08/17/15  Yes Leandrew Koyanagi, MD  losartan-hydrochlorothiazide (HYZAAR) 50-12.5 MG tablet Take 1 tablet by mouth daily. 06/25/15  Yes Jaynee Eagles, PA-C  cyclobenzaprine (FLEXERIL) 10 MG tablet Take 1 tablet (10 mg total) by mouth at bedtime. Patient not taking: Reported on 11/30/2015 08/17/15   Leandrew Koyanagi, MD  meloxicam (MOBIC) 15 MG tablet Take 1 tablet (15 mg total) by mouth daily. Patient not taking: Reported on 11/30/2015 08/17/15   Leandrew Koyanagi, MD    ROS: The  patient denies fevers, chills, night sweats, unintentional weight loss, chest pain, palpitations, wheezing, dyspnea on exertion, nausea, vomiting, abdominal pain, dysuria, hematuria, melena, numbness, weakness, or tingling.   All other systems have been reviewed and were otherwise negative with the exception of those mentioned in the HPI and as above.    PHYSICAL EXAM: Filed Vitals:   11/30/15 1532  BP: 130/90  Pulse: 88  Temp: 98.2 F (36.8 C)  Resp: 18   Body mass index is 39.11 kg/(m^2).   General: Alert, no acute distress HEENT:  Normocephalic, atraumatic, oropharynx patent. Eye: [name] [facility] Cardiovascular:  Regular rate and rhythm, no rubs murmurs or gallops.  No Carotid bruits, radial pulse intact. No pedal edema.  Respiratory: Clear to auscultation bilaterally.  No wheezes, rales, or rhonchi.  No cyanosis, no use of accessory musculature Abdominal: No organomegaly, abdomen is soft and non-tender, positive bowel sounds.  No masses. Musculoskeletal: Gait intact.Tender mid portion of right hamstring with knot like areas adjacent to the area of tenderness. There is pain with knee flexion against the area of tenderness. Bilateral knee replacements. No calf or lower leg swelling.   Skin: No rashes. Neurologic: Facial musculature symmetric. Psychiatric: Patient acts appropriately throughout our interaction. Lymphatic: No cervical or submandibular lymphadenopathy   LABS:   EKG/XRAY:   Primary read interpreted by Dr. Everlene Farrier at [facility].   ASSESSMENT/PLAN: Patient is under pain management with Dr. Greta Doom . I did not write for any pain medications. She received 120 of the 10-325 hydrocodone on 11/19/2015. Advised her to wear compression shorts. I made a referral to Dr. Percell Miller for his evaluation. I did schedule her for Doppler studies of the lower extremities. Gross sideeffects, risk and benefits, and alternatives of medications d/w patient. Patient is aware that all medications have  potential sideeffects and we are unable to predict every sideeffect or drug-drug interaction that may occur.  Arlyss Queen MD 11/30/2015 4:15 PM

## 2015-11-30 NOTE — Patient Instructions (Addendum)
We are referring you for doppler study We are referring you to see Dr. Percell Miller  IF you received an x-ray today, you will receive an invoice from [facility]. Please contact [facility] at [phone] with questions or concerns regarding your invoice.   IF you received labwork today, you will receive an invoice from [employer]. Please contact [facility] at [phone] with questions or concerns regarding your invoice.   Our billing staff will not be able to assist you with questions regarding bills from these companies.  You will be contacted with the lab results as soon as they are available. The fastest way to get your results is to activate your My Chart account. Instructions are located on the last page of this paperwork. If you have not heard from [place] regarding the results in 2 weeks, please contact this office.  Hamstring Strain A hamstring strain is an injury that occurs when the hamstring muscles are overstretched or overloaded. The hamstring muscles are a group of muscles at the back of the thighs. These muscles are used in straightening the hips, bending the knees, and pulling back the legs. This type of injury is often called a pulled hamstring muscle. The severity of a muscle strain is rated in degrees. First-degree strains have the least amount of muscle fiber tearing and pain. Second-degree and third-degree strains have increasingly more tearing and pain. CAUSES Hamstring strains occur when a sudden, violent force is placed on these muscles and stretches them too far. This often occurs during activities that involve running, jumping, kicking, or weight lifting. RISK FACTORS Hamstring strains are especially common in athletes. Other things that can increase your risk for this injury include:  Having low strength, endurance, or flexibility of the hamstring muscles.  Performing high-impact physical activity.  Having poor physical  fitness.  Having a previous leg injury.  Having fatigued muscles.  Older age. SIGNS AND SYMPTOMS  Pain in the back of the thigh.  Bruising.  Swelling.  Muscle spasm.  Difficulty using the muscle because of pain or lack of normal function. For severe strains, you may have a popping or snapping feeling when the injury occurs. DIAGNOSIS Your health care provider will perform a physical exam and ask about your medical history.  TREATMENT Often, the best treatment for a hamstring strain is protecting, resting, icing, applying compression, and elevating the injured area. This is referred to as the PRICE method of treatment. Your health care provider may also recommend medicines to help reduce pain or inflammation. HOME CARE INSTRUCTIONS  Use the PRICE method of treatment to promote muscle healing during the first 2-3 days after your injury. The PRICE method involves:  P--Protecting the muscle from being injured again.  R--Restricting your activity and resting the injured body part.  I--Icing your injury. To do this, put ice in a plastic bag. Place a towel between your skin and the bag. Then, apply the ice and leave it on for 20 minutes, 2-3 times per day. After the third day, switch to moist heat packs.  C--Applying compression to the injured area with an elastic bandage. Be careful not to wrap it too tightly. That may interfere with blood circulation or may increase swelling.  E--Elevating the injured body part above the level of your heart as often as you can. You can do this by putting a pillow under your thigh when you sit or lie down.  Take medicines only as directed by your health care provider.  Begin exercising  or stretching as directed by your health care provider.  Do not return to full activity level until your health care provider approves.  Keep all follow-up visits as directed by your health care provider. This is important. SEEK MEDICAL CARE IF:  You have  increasing pain or swelling in the injured area.  You have numbness, tingling, or a significant loss of strength in the injured area.  Your foot or your toes become cold or turn blue.   This information is not intended to replace advice given to you by your health care provider. Make sure you discuss any questions you have with your health care provider.   Document Released: 06/01/2001 Document Revised: 09/27/2014 Document Reviewed: 04/22/2014 Elsevier Interactive Patient Education [employer].

## 2016-01-25 ENCOUNTER — Encounter (HOSPITAL_COMMUNITY): Payer: Self-pay | Admitting: *Deleted

## 2016-01-25 ENCOUNTER — Emergency Department (HOSPITAL_COMMUNITY)
Admission: EM | Admit: 2016-01-25 | Discharge: 2016-01-25 | Disposition: A | Discharge status: Home / Self Care | Payer: 59 | Attending: Emergency Medicine | Admitting: Emergency Medicine

## 2016-01-25 DIAGNOSIS — M62838 Other muscle spasm: Secondary | ICD-10-CM | POA: Insufficient documentation

## 2016-01-25 DIAGNOSIS — M1990 Unspecified osteoarthritis, unspecified site: Secondary | ICD-10-CM | POA: Diagnosis not present

## 2016-01-25 DIAGNOSIS — I10 Essential (primary) hypertension: Secondary | ICD-10-CM | POA: Diagnosis not present

## 2016-01-25 DIAGNOSIS — M79604 Pain in right leg: Secondary | ICD-10-CM | POA: Insufficient documentation

## 2016-01-25 DIAGNOSIS — Z8669 Personal history of other diseases of the nervous system and sense organs: Secondary | ICD-10-CM | POA: Diagnosis not present

## 2016-01-25 DIAGNOSIS — Z79899 Other long term (current) drug therapy: Secondary | ICD-10-CM | POA: Insufficient documentation

## 2016-01-25 MED ORDER — DIAZEPAM 5 MG PO TABS: 5.0000 mg | ORAL_TABLET | Freq: Two times a day (BID) | ORAL | Status: DC

## 2016-01-25 MED ORDER — OXYCODONE HCL 5 MG PO TABS
5.0000 mg | ORAL_TABLET | Freq: Once | ORAL | Status: AC
  Administered 2016-01-25: 5 mg via ORAL
  Filled 2016-01-25: qty 1

## 2016-01-25 MED ORDER — DIAZEPAM 5 MG PO TABS
5.0000 mg | ORAL_TABLET | Freq: Once | ORAL | Status: AC
  Administered 2016-01-25: 5 mg via ORAL
  Filled 2016-01-25: qty 1

## 2016-01-25 NOTE — ED Notes (Signed)
Pt reports pain to LT upper leg worse over past 2 weeks . Pain first started 2 months ago.

## 2016-01-25 NOTE — ED Notes (Signed)
Declined W/C at D/C and was escorted to lobby by RN. 

## 2016-01-25 NOTE — ED Provider Notes (Signed)
CSN: [id number]     Arrival date & time 01/25/16  1154 History  By signing my name below, I, Eustaquio Maize, attest that this documentation has been prepared under the direction and in the presence of Harlene Ramus, PA-C. Electronically Signed: Eustaquio Maize, ED Scribe. 01/25/2016. 12:31 PM.   Chief Complaint  Patient presents with  . Leg Pain   The history is provided by the patient. No language interpreter was used.    HPI Comments: Ashley [name] is a 40 y.o. female who presents to the Emergency Department complaining of gradual onset, constant, ripping, right posterior thigh pain x 2 months, worsening over the past 1 week. Pt reports that she tore her hamstring which was diagnosed by ultrasound 2 months ago by orthopedist, Dr. Percell Miller. She had a follow up appointment with him 2 weeks ago but was not re-scanned at that time. Pt has a follow up appointment with him on 02/23/2016 (approximatley 1 month from now). Pt has been going through PT and believes it has been worsening her pain and aggravating the muscle. She denies any recent fall or trauma in the past week that could have worsened her pain but notes she increased her therapy exercises this week which includes exercises with ankle weights. Her pain is exacerbated with movement. 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 She has been taking Gabapentin, Norco, and Robaxin without relief. Denies fever, leg swelling, weakness, numbness, tingling, or any other associated symptoms.   Per pt's brought in charts: The ultrasound found splits in the long head in the biceps femoris musculotendinous junction distally. It was classified as a Grade 2 strain.   Past Medical History  Diagnosis Date  . PCOS (polycystic ovarian syndrome)   . Arthritis     knees, back  . 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 Hypertension    Past Surgical History  Procedure Laterality Date  . Ankle  surgery      x 2  . Dilation and evacuation  04/02/2011    Procedure: DILATATION AND EVACUATION (D&E);  Surgeon: Luz Lex, MD;  Location: [facility];  Service: Gynecology;  Laterality: N/A;  dvt left mid thigh  . Shoulder arthroscopy Right   . 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 Hysteroscopy w/d&c  07/17/2010    with exc. endometrial polyps  . Total knee arthroplasty Right 08/26/2008  . Endoscopic plantar fasciotomy Left 02/01/2002  . Toe surgery Left 01/10/2003    claw toe correction 2nd, 3rd, 4th toes  . Knee arthroscopy Right 03/07/2003; 01/14/2005; 08/31/2006  . Knee arthroscopy Left 03/21/2003; 11/26/2004  . Carpal tunnel release Left 07/04/2014    Procedure: LEFT CARPAL TUNNEL RELEASE;  Surgeon: Ninetta Lights, MD;  Location: [place];  Service: Orthopedics;  Laterality: Left;  . Dorsal compartment release Left 07/04/2014    Procedure: LEFT DEQUERVAINS;  Surgeon: Ninetta Lights, MD;  Location: [place];  Service: Orthopedics;  Laterality: Left;  . Carpal tunnel release Right 08/08/2014    Procedure: RIGHT CARPAL TUNNEL RELEASE;  Surgeon: Ninetta Lights, MD;  Location: [place];  Service: Orthopedics;  Laterality: Right;   Family History  Problem Relation Age of Onset  . Diabetes Father   . Cancer Father     COLON  . Heart disease Father   . Kidney disease Father     dialysis  . Diabetes Sister   .  Cancer Maternal Grandmother     UTERINE  . Cancer Paternal Grandmother     breast   Social History  Substance Use Topics  . Smoking status: Never Smoker   . Smokeless tobacco: Never Used  . Alcohol Use: No   OB History    Gravida Para Term Preterm AB TAB SAB Ectopic Multiple Living   4 2 1 1 2 1 1   2      Review of Systems  Constitutional: Negative for fever.  Cardiovascular: Negative for leg swelling.  Musculoskeletal: Positive for myalgias (right posterior thigh) and arthralgias (right hip).  Neurological: Negative  for weakness and numbness.   Allergies  Codeine and Cymbalta  Home Medications   Prior to Admission medications   Medication Sig Start Date End Date Taking? Authorizing Provider  cetirizine (ZYRTEC) 10 MG tablet Take 1 tablet (10 mg total) by mouth daily. 06/25/15   Jaynee Eagles, PA-C  cyclobenzaprine (FLEXERIL) 10 MG tablet Take 1 tablet (10 mg total) by mouth at bedtime. Patient not taking: Reported on 11/30/2015 08/17/15   Leandrew Koyanagi, MD  diazepam (VALIUM) 5 MG tablet Take 1 tablet (5 mg total) by mouth 2 (two) times daily. 01/25/16   Nona Dell, PA-C  HYDROcodone-acetaminophen (NORCO/VICODIN) 5-325 MG tablet Take 1 tablet by mouth at bedtime as needed for moderate pain. 08/17/15   Leandrew Koyanagi, MD  losartan-hydrochlorothiazide (HYZAAR) 50-12.5 MG tablet Take 1 tablet by mouth daily. 06/25/15   Jaynee Eagles, PA-C  meloxicam (MOBIC) 15 MG tablet Take 1 tablet (15 mg total) by mouth daily. Patient not taking: Reported on 11/30/2015 08/17/15   Leandrew Koyanagi, MD   BP 142/80 mmHg  Pulse 101  Temp(Src) 98.6 F (37 C) (Oral)  Resp 18  SpO2 99%  LMP 12/26/2015  Breastfeeding? No   Physical Exam  Constitutional: She is oriented to person, place, and time. She appears well-developed and well-nourished.  HENT:  Head: Normocephalic and atraumatic.  Eyes: Conjunctivae and EOM are normal. Right eye exhibits no discharge. Left eye exhibits no discharge. No scleral icterus.  Neck: Normal range of motion. Neck supple.  Cardiovascular: Normal rate.   Pulmonary/Chest: Effort normal.  Abdominal: Soft. She exhibits no distension.  Musculoskeletal: She exhibits tenderness. She exhibits no edema.  TTP over right bicep femoris with palpable spasm at right mid hamstring muscle belly. Pain with flexion and extension of right hip and flexion of right knee. Full active ROM of right hip and knee. 4/5 strength of RLE due to pain. Sensation grossly intact. 2+ PT pulse. Pt able to stand  and ambulate but limps when bearing weight on right leg and reports pain to right hamstring. No leg swelling.  Neurological: She is alert and oriented to person, place, and time.  Skin: Skin is warm and dry.  Nursing note and vitals reviewed.   ED Course  Procedures (including critical care time)  DIAGNOSTIC STUDIES: Oxygen Saturation is 99% on RA, normal by my interpretation.    COORDINATION OF CARE: 12:29 PM-Discussed treatment plan which includes Valium and Oxycodone tablet with pt at bedside and pt agreed to plan.   Labs Review Labs Reviewed - No data to display  Imaging Review No results found.    EKG Interpretation None      MDM   Final diagnoses:  Pain of right lower extremity  Muscle spasm   Patient presents with worsening right hamstring pain. She notes she tore her hamstring 2 months ago during a spin class,  was seen by Dr. Percell Miller and dx with hamstring tear and had worsening pain this week after increasing her therapy exercises at PT. patient denies any other recent fall, trauma, injury. VSS. Exam revealed TTP over right bicep femoris with palpable spasm noted at mid muscle belly, pain with flex/ext of right hip and flexion of right knee. Right LE otherwise neurovascularly intact. Due to pt with reported injury of hamstring tear and no other recent injury or fall, I do not feel that any emergent imaging is warranted at this time. I do no suspect DVT at this time. Pt given pain meds and muscle relaxant in the ED. Chart review shows that pt is under pain contract with pain clinic. Plan to d/c pt home with muscle relaxant and advised her to continue taking her rx home pain meds but follow up with pain clinic doctor for management of her pain. Discussed plan for d/c with pt, symptomatic tx and advised pt to follow up with Dr. Percell Miller this week.   I personally performed the services described in this documentation, which was scribed in my presence. The recorded information has  been reviewed and is accurate.    vt   Chesley Noon [place], [place] 01/25/16 1319  Sherwood Gambler, MD 01/28/16 [phone]

## 2016-01-25 NOTE — Discharge Instructions (Signed)
Take your medication as prescribed as needed for muscle spasm. I also recommend continuing to take your pain medications which are prescribed 2 through your pain clinic as needed for pain control. I recommend continuing to apply heat to affected leg for 15-20 minutes 3-4 times daily. Refrain from doing any heavy lifting, repetitive movements or exacerbating movements that worsen your symptoms. I recommend refraining from doing any therapy exercises that include resistance bands were added weights. Follow-up with Dr. Percell Miller in the next 4-5 days for further management.  I recommend calling your pain doctor regarding further management of your pain from your recent hamstring tear. Please return to the Emergency Department if symptoms worsen or new onset of fever, redness, swelling, warmth, numbness, tingling, weakness, chest pain, difficulty breathing.

## 2016-01-30 ENCOUNTER — Other Ambulatory Visit: Payer: Self-pay | Admitting: Sports Medicine

## 2016-01-30 DIAGNOSIS — M5441 Lumbago with sciatica, right side: Principal | ICD-10-CM

## 2016-01-30 DIAGNOSIS — S76301A Unspecified injury of muscle, fascia and tendon of the posterior muscle group at thigh level, right thigh, initial encounter: Secondary | ICD-10-CM

## 2016-01-30 DIAGNOSIS — G8929 Other chronic pain: Secondary | ICD-10-CM

## 2016-02-07 ENCOUNTER — Ambulatory Visit
Admission: RE | Admit: 2016-02-07 | Discharge: 2016-02-07 | Disposition: A | Discharge status: Home / Self Care | Payer: 59 | Source: Ambulatory Visit | Attending: Sports Medicine | Admitting: Sports Medicine

## 2016-02-07 DIAGNOSIS — S76301A Unspecified injury of muscle, fascia and tendon of the posterior muscle group at thigh level, right thigh, initial encounter: Secondary | ICD-10-CM

## 2016-02-07 DIAGNOSIS — M5441 Lumbago with sciatica, right side: Principal | ICD-10-CM

## 2016-02-07 DIAGNOSIS — G8929 Other chronic pain: Secondary | ICD-10-CM

## 2016-02-29 IMAGING — MR MR HIP*L* W/O CM
4 of 5 series · 15 of 40 positions shown · non-contrast
Comparison: 05/20/2014

CLINICAL DATA: Left hip injury after stepping in a deep hole in
April 2014. Pain and difficulty walking.

EXAM:
MR OF THE LEFT HIP WITHOUT CONTRAST
TECHNIQUE: Multiplanar, multisequence MR imaging was performed. No intravenous
contrast was administered.

[Series 4: T1 · coronal · 5.0mm · 0.56mm/px · 3 of 18 slices shown (1 of 2)]
[im 3/18]
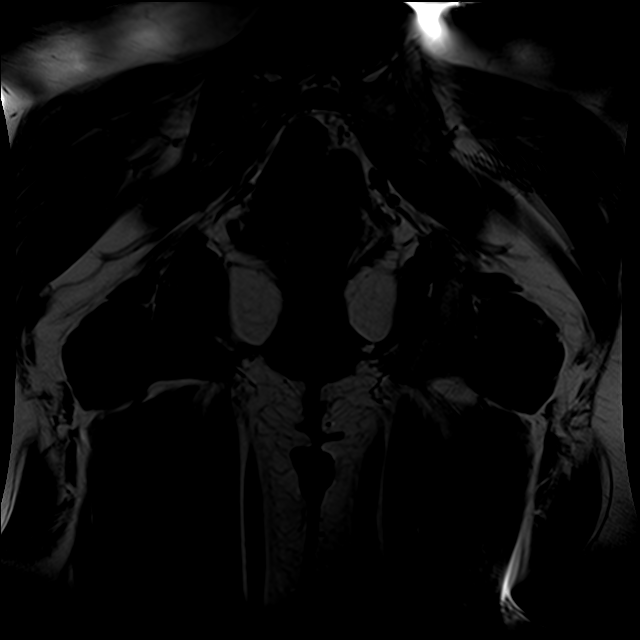
[im 10/18]
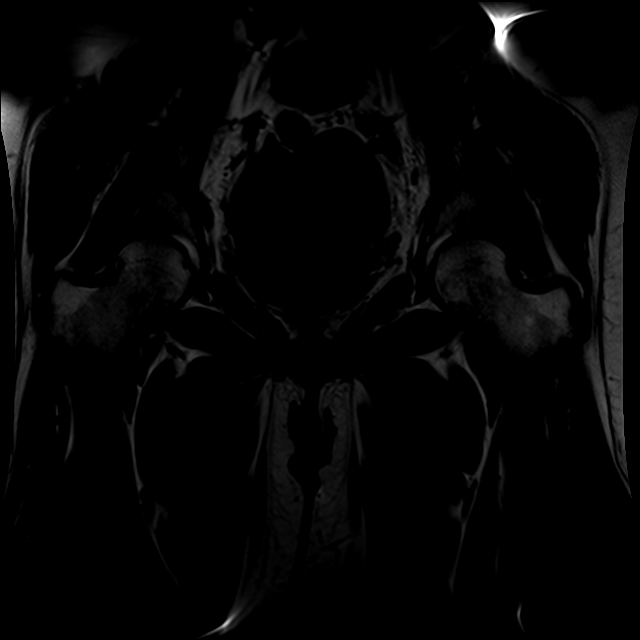
[im 15/18]
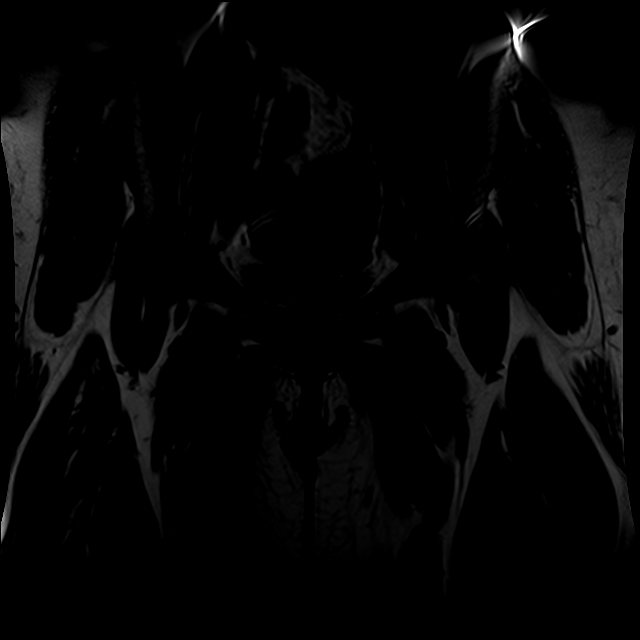

[Series 6: T2 fat-sat · axial · 4.0mm · 0.56mm/px · z∈[-29,+41]mm · 3 of 20 slices shown]
[im 3/20]
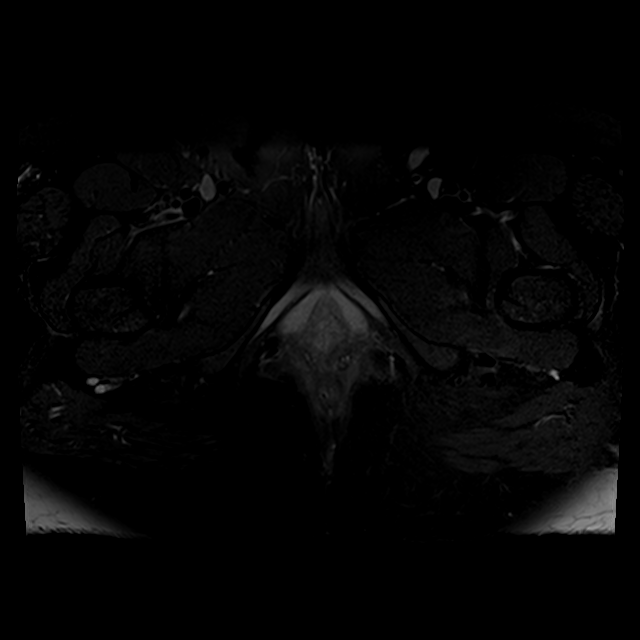
[im 11/20]
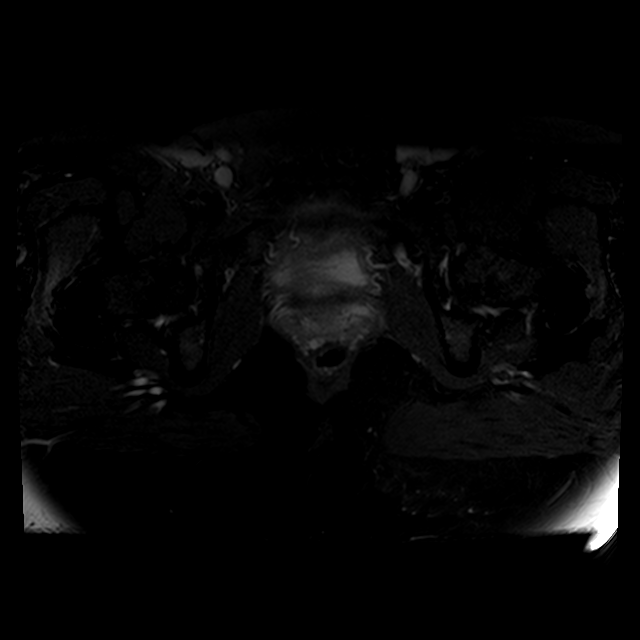
[im 17/20]
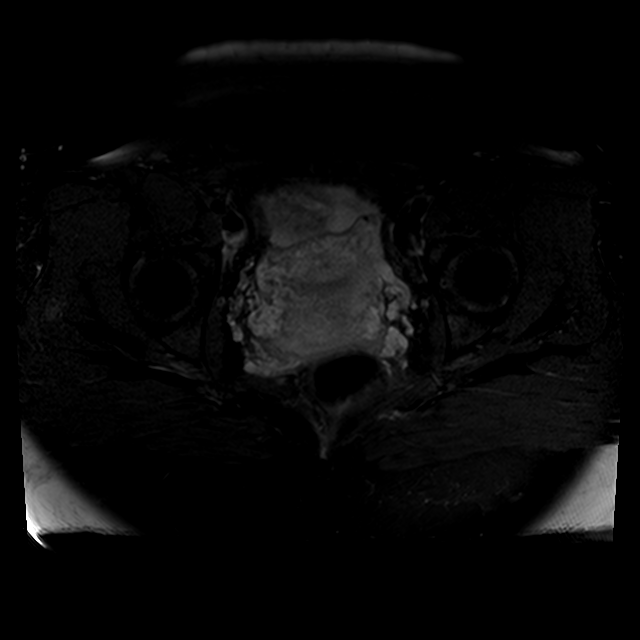

[Series 7: T1 · axial · 4.0mm · 0.56mm/px · z∈[-29,+41]mm · 3 of 19 slices shown (2 of 2)]
[im 3/19]
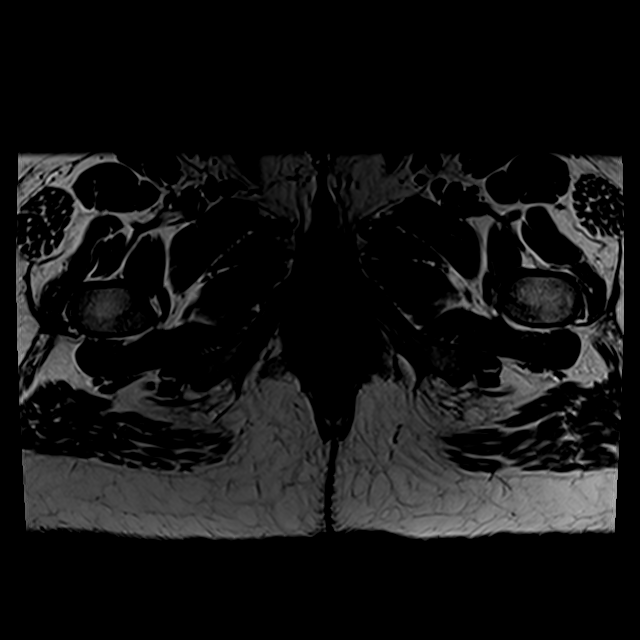
[im 11/19]
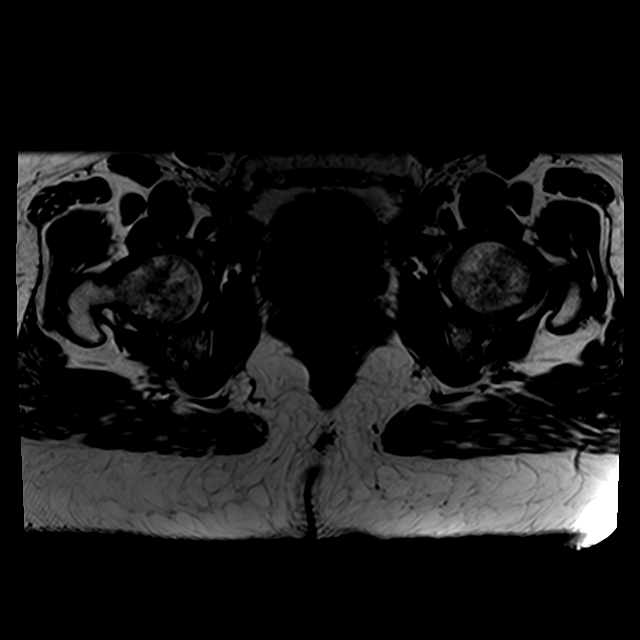
[im 16/19]
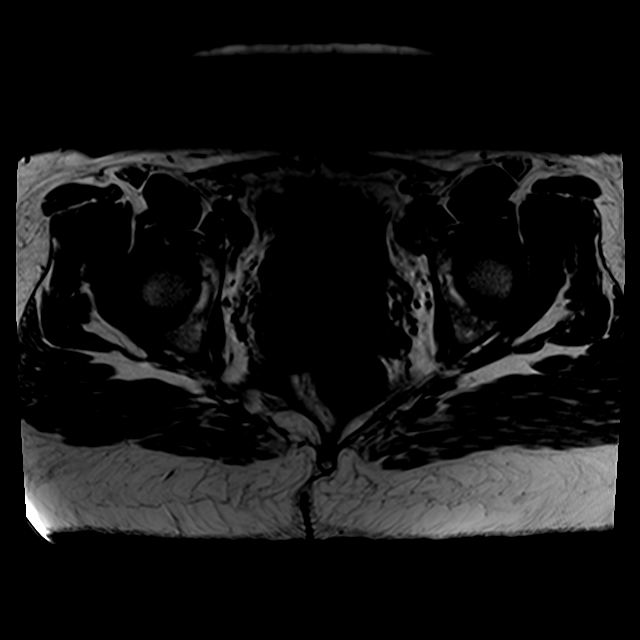

[Series 8: PD fat-sat · sagittal · 4.0mm · 0.35mm/px · 6 of 20 slices shown]
[im 1/20]
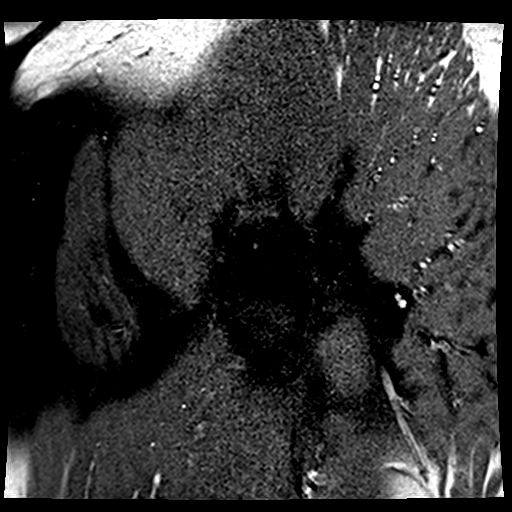
[im 3/20]
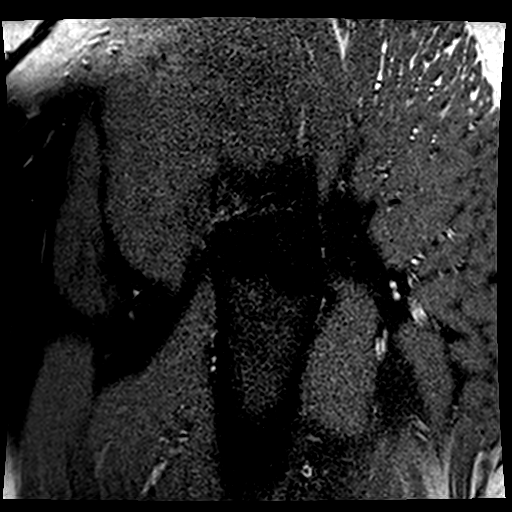
[im 6/20]
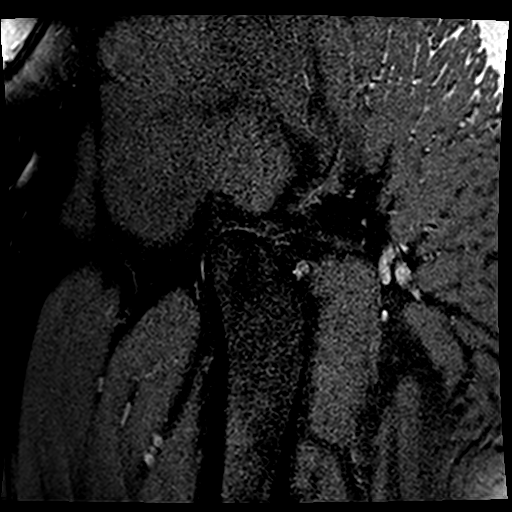
[im 9/20]
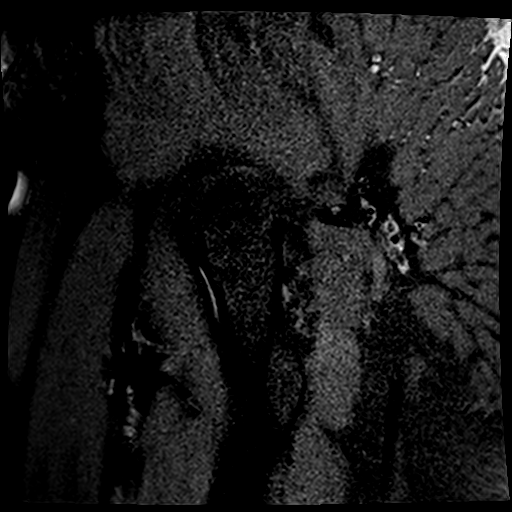
[im 11/20]
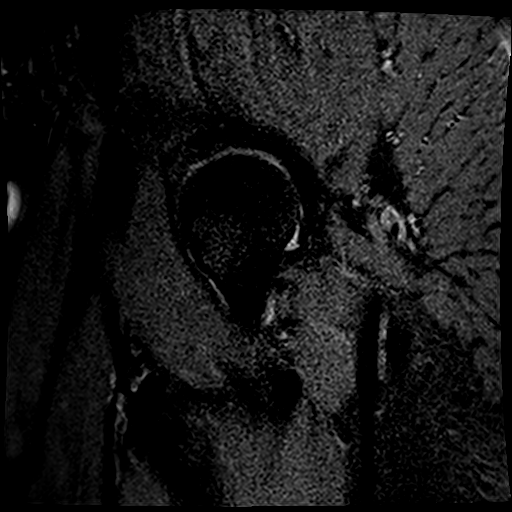
[im 17/20]
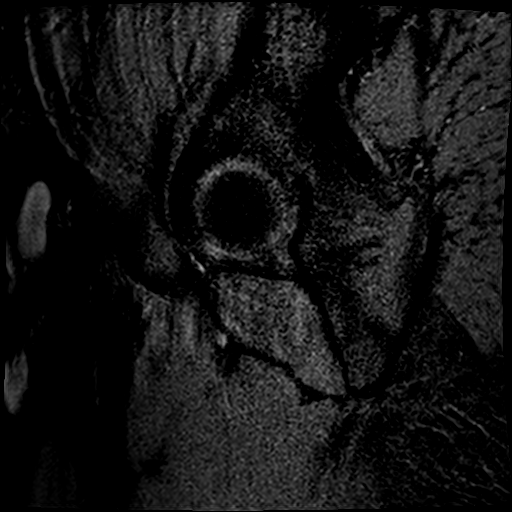

[15 of 40 positions shown; findings below may reference images not displayed]

FINDINGS: Despite efforts by the technologist and patient, motion artifact is
present on today's exam and could not be eliminated. This reduces
exam sensitivity and specificity.

Bones: Abnormal edema signal in both pubic bodies, with edema along
the right adductor aponeurosis attachment on image 15 of series 5.
Proximal femur osseous signal unremarkable.

Articular cartilage and labrum

Articular cartilage:  Grossly unremarkable.

Labrum:  Grossly unremarkable.

Joint or bursal effusion

Joint effusion:  None

Bursae:  Normal

Muscles and tendons

Muscles and tendons: Right proximal adductor aponeurosis at its
pubic attachment is partially torn.

Other findings

Miscellaneous: Trace amount of free pelvic fluid. Retroflexed
uterus. Scattered small inguinal and pelvic sidewall lymph nodes,
not overtly pathologically enlarged by size criteria.
IMPRESSION: 1. Abnormal bony signal in the pubis, slightly greater on the right
and left, with partial tearing of the right adductor aponeurosis at
its pubic attachment. This is a common appearance in athletic
pubalgia.
2. Trace amount of free pelvic fluid, possibly physiologic.

## 2016-04-06 ENCOUNTER — Other Ambulatory Visit: Payer: Self-pay | Admitting: Physician Assistant

## 2016-04-06 NOTE — H&P (Signed)
TOTAL HIP ADMISSION H&P  Patient is admitted for right total hip arthroplasty.  Subjective:  Chief Complaint: right hip pain  HPI: Ashley [name], 40 y.o. female, has a history of pain and functional disability in the right hip(s) due to arthritis and patient has failed non-surgical conservative treatments for greater than 12 weeks to include NSAID's and/or analgesics, corticosteriod injections and activity modification.  Onset of symptoms was gradual starting 5 years ago with rapidlly worsening course since that time.The patient noted no past surgery on the right hip(s).  Patient currently rates pain in the right hip at 5 out of 10 with activity. Patient has night pain, worsening of pain with activity and weight bearing, trendelenberg gait, pain that interfers with activities of daily living and pain with passive range of motion. Patient has evidence of subchondral sclerosis and joint space narrowing by imaging studies. This condition presents safety issues increasing the risk of falls.  There is no current active infection.  Patient Active Problem List   Diagnosis Date Noted  . Sinusitis 06/25/2015  . Cough 06/25/2015  . Essential hypertension 04/03/2015  . Dehydration 04/03/2015  . CAP (community acquired pneumonia) 03/31/2015  . Right lower lobe pneumonia 03/31/2015  . Fever 03/31/2015  . Pregnancy 04/29/2014  . Miscarriage   . Incomplete miscarriage 04/02/2011    Class: Acute  . 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 PCOS (polycystic ovarian syndrome)   . Arthritis     knees, back  . 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 Hypertension     Past Surgical History  Procedure Laterality Date  . Ankle surgery      x 2  . Dilation and evacuation  04/02/2011    Procedure: DILATATION AND EVACUATION (D&E);  Surgeon: Luz Lex, MD;  Location: [facility];  Service: Gynecology;  Laterality: N/A;  dvt  left mid thigh  . Shoulder arthroscopy Right   . 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 Hysteroscopy w/d&c  07/17/2010    with exc. endometrial polyps  . Total knee arthroplasty Right 08/26/2008  . Endoscopic plantar fasciotomy Left 02/01/2002  . Toe surgery Left 01/10/2003    claw toe correction 2nd, 3rd, 4th toes  . Knee arthroscopy Right 03/07/2003; 01/14/2005; 08/31/2006  . Knee arthroscopy Left 03/21/2003; 11/26/2004  . Carpal tunnel release Left 07/04/2014    Procedure: LEFT CARPAL TUNNEL RELEASE;  Surgeon: Ninetta Lights, MD;  Location: [facility];  Service: Orthopedics;  Laterality: Left;  . Dorsal compartment release Left 07/04/2014    Procedure: LEFT DEQUERVAINS;  Surgeon: Ninetta Lights, MD;  Location: [facility];  Service: Orthopedics;  Laterality: Left;  . Carpal tunnel release Right 08/08/2014    Procedure: RIGHT CARPAL TUNNEL RELEASE;  Surgeon: Ninetta Lights, MD;  Location: [facility];  Service: Orthopedics;  Laterality: Right;     (Not in a hospital admission) Allergies  Allergen Reactions  . Codeine Itching and Nausea Only  . Cymbalta [Duloxetine Hcl] Other (See Comments)    Altered mental status    Social History  Substance Use Topics  . Smoking status: Never Smoker   . Smokeless tobacco: Never Used  . Alcohol Use: No    Family History  Problem Relation Age of Onset  . Diabetes Father   . Cancer Father     COLON  . Heart disease Father   .  Kidney disease Father     dialysis  . Diabetes Sister   . Cancer Maternal Grandmother     UTERINE  . Cancer Paternal Grandmother     breast     Review of Systems  Constitutional: Negative.   HENT: Negative.   Eyes: Negative.   Respiratory: Negative.   Cardiovascular: Negative.   Gastrointestinal: Negative.   Genitourinary: Negative.   Musculoskeletal: Positive for back pain and joint pain.  Skin: Negative.   Neurological: Negative.   Endo/Heme/Allergies: Negative.    Psychiatric/Behavioral: Negative.     Objective:  Physical Exam  Constitutional: She is oriented to person, place, and time. She appears well-developed and well-nourished.  HENT:  Head: Normocephalic and atraumatic.  Eyes: EOM are normal. Pupils are equal, round, and reactive to light.  Neck: Normal range of motion. Neck supple.  Cardiovascular: Normal rate and regular rhythm.   Respiratory: Effort normal and breath sounds normal.  GI: Soft. Bowel sounds are normal.  Musculoskeletal:  Examination of the right lower extremity reveals a positive straight leg roll with a positive straight leg raise. She has decreased internal and external rotation on the right. Neurovascularly intact distally.     Neurological: She is alert and oriented to person, place, and time.  Skin: Skin is warm and dry.  Psychiatric: She has a normal mood and affect. Her behavior is normal. Judgment and thought content normal.    Vital signs in last 24 hours: @VSRANGES @  Labs:   Estimated body mass index is 38.93 kg/(m^2) as calculated from the following:   Height as of 11/30/15: 6\' 1"  (1.854 m).   Weight as of 02/07/16: 133.811 kg (295 lb).   Imaging Review Plain radiographs demonstrate severe degenerative joint disease of the right hip(s). The bone quality appears to be fair for age and reported activity level.  Assessment/Plan:  End stage arthritis, right hip(s)  The patient history, physical examination, clinical judgement of the provider and imaging studies are consistent with end stage degenerative joint disease of the right hip(s) and total hip arthroplasty is deemed medically necessary. The treatment options including medical management, injection therapy, arthroscopy and arthroplasty were discussed at length. The risks and benefits of total hip arthroplasty were presented and reviewed. The risks due to aseptic loosening, infection, stiffness, dislocation/subluxation,  thromboembolic complications  and other imponderables were discussed.  The patient acknowledged the explanation, agreed to proceed with the plan and consent was signed. Patient is being admitted for inpatient treatment for surgery, pain control, PT, OT, prophylactic antibiotics, VTE prophylaxis, progressive ambulation and ADL's and discharge planning.The patient is planning to be discharged home with home health services

## 2016-04-07 NOTE — Pre-Procedure Instructions (Signed)
Ashley [name]  04/07/2016      [id number] [address], [place] [address] Phone: [phone] Fax: [phone]    Your procedure is scheduled on Wednesday, August 2.   Report to [facility] Admitting at 6:30 A.M.   Call this number if you have problems the morning of surgery:  [phone]   Remember:  Do not eat food or drink liquids after midnight.  Take these medicines the morning of surgery with A SIP OF WATER: diazepam (valium)  7 days prior to surgery STOP taking any Aspirin, Aleve, Naproxen, Ibuprofen, Motrin, Advil, Goody's, BC's, all herbal medications, fish oil, and all vitamins (meloxicam (mobic), naproxen (naprosyn))    Do not wear jewelry, make-up or nail polish.  Do not wear lotions, powders, or perfumes.  Do not shave 48 hours prior to surgery.    Do not bring valuables to the hospital.  [facility] is not responsible for any belongings or valuables.  Contacts, dentures or bridgework may not be worn into surgery.  Leave your suitcase in the car.  After surgery it may be brought to your room.  For patients admitted to the hospital, discharge time will be determined by your treatment team.  Patients discharged the day of surgery will not be allowed to drive home.   Special instructions:     [facility]- Preparing For Surgery  Before surgery, you can play an important role. Because skin is not sterile, your skin needs to be as free of germs as possible. You can reduce the number of germs on your skin by washing with CHG (chlorahexidine gluconate) Soap before surgery.  CHG is an antiseptic cleaner which kills germs and bonds with the skin to continue killing germs even after washing.  Please do not use if you have an allergy to CHG or antibacterial soaps. If your skin becomes reddened/irritated stop using the CHG.  Do not shave (including legs and underarms) for at least  48 hours prior to first CHG shower. It is OK to shave your face.  Please follow these instructions carefully.   1. Shower the NIGHT BEFORE SURGERY and the MORNING OF SURGERY with CHG.   2. If you chose to wash your hair, wash your hair first as usual with your normal shampoo.  3. After you shampoo, rinse your hair and body thoroughly to remove the shampoo.  4. Use CHG as you would any other liquid soap. You can apply CHG directly to the skin and wash gently with a scrungie or a clean washcloth.   5. Apply the CHG Soap to your body ONLY FROM THE NECK DOWN.  Do not use on open wounds or open sores. Avoid contact with your eyes, ears, mouth and genitals (private parts). Wash genitals (private parts) with your normal soap.  6. Wash thoroughly, paying special attention to the area where your surgery will be performed.  7. Thoroughly rinse your body with warm water from the neck down.  8. DO NOT shower/wash with your normal soap after using and rinsing off the CHG Soap.  9. Pat yourself dry with a CLEAN TOWEL.   10. Wear CLEAN PAJAMAS   11. Place CLEAN SHEETS on your bed the night of your first shower and DO NOT SLEEP WITH PETS.    Day of Surgery: Do not apply any deodorants/lotions. Please wear clean clothes to the hospital/surgery center.      Please read  over the following fact sheets that you were given. Coughing and Deep Breathing, Blood Transfusion Information, MRSA Information and Surgical Site Infection Prevention

## 2016-04-08 ENCOUNTER — Encounter (HOSPITAL_COMMUNITY): Payer: Self-pay

## 2016-04-08 ENCOUNTER — Encounter (HOSPITAL_COMMUNITY)
Admission: RE | Admit: 2016-04-08 | Discharge: 2016-04-08 | Disposition: A | Discharge status: Home / Self Care | Payer: 59 | Source: Ambulatory Visit | Attending: Orthopedic Surgery | Admitting: Orthopedic Surgery

## 2016-04-08 DIAGNOSIS — M1611 Unilateral primary osteoarthritis, right hip: Secondary | ICD-10-CM | POA: Diagnosis not present

## 2016-04-08 DIAGNOSIS — Z01812 Encounter for preprocedural laboratory examination: Secondary | ICD-10-CM | POA: Diagnosis not present

## 2016-04-08 DIAGNOSIS — Z01818 Encounter for other preprocedural examination: Secondary | ICD-10-CM | POA: Diagnosis present

## 2016-04-08 DIAGNOSIS — R9431 Abnormal electrocardiogram [ECG] [EKG]: Secondary | ICD-10-CM | POA: Insufficient documentation

## 2016-04-08 DIAGNOSIS — I10 Essential (primary) hypertension: Secondary | ICD-10-CM | POA: Insufficient documentation

## 2016-04-08 HISTORY — DX: Other specified postprocedural states: Z98.890

## 2016-04-08 HISTORY — DX: Adverse effect of unspecified anesthetic, initial encounter: T41.45XA

## 2016-04-08 HISTORY — DX: Other complications of anesthesia, initial encounter: T88.59XA

## 2016-04-08 HISTORY — DX: Personal history of pneumonia (recurrent): Z87.01

## 2016-04-08 HISTORY — DX: Nausea with vomiting, unspecified: R11.2

## 2016-04-08 LAB — SURGICAL PCR SCREEN
MRSA, PCR: NEGATIVE
Staphylococcus aureus: NEGATIVE

## 2016-04-08 LAB — BASIC METABOLIC PANEL
Anion gap: 5 (ref 5–15)
BUN: 10 mg/dL (ref 6–20)
CO2: 27 mmol/L (ref 22–32)
Calcium: 8.8 mg/dL — ABNORMAL LOW (ref 8.9–10.3)
Chloride: 108 mmol/L (ref 101–111)
Creatinine, Ser: 0.87 mg/dL (ref 0.44–1.00)
GFR calc Af Amer: >60 mL/min (ref >60)
GFR calc non Af Amer: >60 mL/min (ref >60)
Glucose, Bld: 92 mg/dL (ref 65–99)
Potassium: 4 mmol/L (ref 3.5–5.1)
Sodium: 140 mmol/L (ref 135–145)

## 2016-04-08 LAB — TYPE AND SCREEN
ABO/RH(D): B POS
Antibody Screen: NEGATIVE

## 2016-04-08 LAB — CBC
HCT: 38.7 % (ref 36.0–46.0)
Hemoglobin: 12.4 g/dL (ref 12.0–15.0)
MCH: 26.2 pg (ref 26.0–34.0)
MCHC: 32 g/dL (ref 30.0–36.0)
MCV: 81.6 fL (ref 78.0–100.0)
Platelets: 183 10*3/uL (ref 150–400)
RBC: 4.74 MIL/uL (ref 3.87–5.11)
RDW: 15.6 % — ABNORMAL HIGH (ref 11.5–15.5)
WBC: 6.2 10*3/uL (ref 4.0–10.5)

## 2016-04-08 LAB — HCG, SERUM, QUALITATIVE: Preg, Serum: NEGATIVE

## 2016-04-08 NOTE — Progress Notes (Signed)
PCP - Maude Leriche Cardiologist - denies  Chest x-ray - 06/04/15 EKG - 04/08/16 Stress Test - denies ECHO - denies Cardiac Cath - denies    Patient denies shortness of breath, fever, cough and chest pain at PAT appointment

## 2016-04-20 MED ORDER — DEXTROSE 5 % IV SOLN
3.0000 g | INTRAVENOUS | Status: AC
  Administered 2016-04-21: 3 g via INTRAVENOUS
  Filled 2016-04-20: qty 3000

## 2016-04-20 NOTE — Anesthesia Preprocedure Evaluation (Addendum)
Anesthesia Evaluation  Patient identified by MRN, date of birth, ID band Patient awake    Reviewed: Allergy & Precautions, NPO status , Patient's Chart, lab work & pertinent test results  History of Anesthesia Complications (+) PONV and history of anesthetic complications  Airway Mallampati: III  TM Distance: >3 FB Neck ROM: Full    Dental  (+) Teeth Intact, Dental Advisory Given   Pulmonary neg pulmonary ROS,    breath sounds clear to auscultation       Cardiovascular hypertension, Pt. on medications  Rhythm:Regular Rate:Normal     Neuro/Psych  Neuromuscular disease negative psych ROS   GI/Hepatic negative GI ROS, Neg liver ROS,   Endo/Other  negative endocrine ROS  Renal/GU negative Renal ROS     Musculoskeletal  (+) Arthritis ,   Abdominal (+) + obese,   Peds negative pediatric ROS (+)  Hematology negative hematology ROS (+)   Anesthesia Other Findings PCOS  Reproductive/Obstetrics negative OB ROS                            Lab Results  Component Value Date   WBC 6.2 04/08/2016   HGB 12.4 04/08/2016   HCT 38.7 04/08/2016   MCV 81.6 04/08/2016   PLT 183 04/08/2016   Lab Results  Component Value Date   CREATININE 0.87 04/08/2016   BUN 10 04/08/2016   NA 140 04/08/2016   K 4.0 04/08/2016   CL 108 04/08/2016   CO2 27 04/08/2016   Lab Results  Component Value Date   INR 0.95 09/14/2013   INR 0.88 11/06/2011   INR 2.25 (H) 09/01/2010   03/2016 KEG: NSR   Anesthesia Physical Anesthesia Plan  ASA: III  Anesthesia Plan: General   Post-op Pain Management:    Induction: Intravenous  Airway Management Planned: LMA  Additional Equipment:   Intra-op Plan: Utilization Of Total Body Hypothermia per surgeon request  Post-operative Plan: Extubation in OR  Informed Consent: I have reviewed the patients History and Physical, chart, labs and discussed the procedure  including the risks, benefits and alternatives for the proposed anesthesia with the patient or authorized representative who has indicated his/her understanding and acceptance.   Dental advisory given  Plan Discussed with: CRNA  Anesthesia Plan Comments: (Pt declined spinal. )       Anesthesia Quick Evaluation

## 2016-04-21 ENCOUNTER — Inpatient Hospital Stay (HOSPITAL_COMMUNITY)
Admission: RE | Admit: 2016-04-21 | Discharge: 2016-04-23 | DRG: 470 | Disposition: A | Discharge status: Home Health | Payer: 59 | Source: Ambulatory Visit | Attending: Orthopedic Surgery | Admitting: Orthopedic Surgery

## 2016-04-21 ENCOUNTER — Encounter (HOSPITAL_COMMUNITY)
Admission: RE | Disposition: A | Discharge status: Home Health | Payer: Self-pay | Source: Ambulatory Visit | Attending: Orthopedic Surgery

## 2016-04-21 ENCOUNTER — Inpatient Hospital Stay (HOSPITAL_COMMUNITY): Payer: 59

## 2016-04-21 ENCOUNTER — Inpatient Hospital Stay (HOSPITAL_COMMUNITY): Payer: 59 | Admitting: Certified Registered"

## 2016-04-21 ENCOUNTER — Encounter (HOSPITAL_COMMUNITY): Payer: Self-pay | Admitting: *Deleted

## 2016-04-21 DIAGNOSIS — Z885 Allergy status to narcotic agent status: Secondary | ICD-10-CM | POA: Diagnosis not present

## 2016-04-21 DIAGNOSIS — Z6838 Body mass index (BMI) 38.0-38.9, adult: Secondary | ICD-10-CM | POA: Diagnosis not present

## 2016-04-21 DIAGNOSIS — Z888 Allergy status to other drugs, medicaments and biological substances status: Secondary | ICD-10-CM

## 2016-04-21 DIAGNOSIS — Z419 Encounter for procedure for purposes other than remedying health state, unspecified: Secondary | ICD-10-CM

## 2016-04-21 DIAGNOSIS — I10 Essential (primary) hypertension: Secondary | ICD-10-CM | POA: Diagnosis present

## 2016-04-21 DIAGNOSIS — M1611 Unilateral primary osteoarthritis, right hip: Secondary | ICD-10-CM | POA: Diagnosis present

## 2016-04-21 DIAGNOSIS — E282 Polycystic ovarian syndrome: Secondary | ICD-10-CM | POA: Diagnosis present

## 2016-04-21 DIAGNOSIS — Z86718 Personal history of other venous thrombosis and embolism: Secondary | ICD-10-CM | POA: Diagnosis not present

## 2016-04-21 DIAGNOSIS — M549 Dorsalgia, unspecified: Secondary | ICD-10-CM | POA: Diagnosis present

## 2016-04-21 DIAGNOSIS — Z96653 Presence of artificial knee joint, bilateral: Secondary | ICD-10-CM | POA: Diagnosis present

## 2016-04-21 DIAGNOSIS — E669 Obesity, unspecified: Secondary | ICD-10-CM | POA: Diagnosis present

## 2016-04-21 DIAGNOSIS — M25551 Pain in right hip: Secondary | ICD-10-CM | POA: Diagnosis present

## 2016-04-21 HISTORY — PX: TOTAL HIP ARTHROPLASTY: SHX124

## 2016-04-21 SURGERY — ARTHROPLASTY, HIP, TOTAL, ANTERIOR APPROACH
Anesthesia: General | Laterality: Right

## 2016-04-21 MED ORDER — MIDAZOLAM HCL 2 MG/2ML IJ SOLN
INTRAMUSCULAR | Status: AC
  Filled 2016-04-21: qty 2

## 2016-04-21 MED ORDER — PROMETHAZINE HCL 25 MG/ML IJ SOLN: 6.2500 mg | INTRAMUSCULAR | Status: DC | PRN

## 2016-04-21 MED ORDER — CHLORHEXIDINE GLUCONATE 4 % EX LIQD: 60.0000 mL | Freq: Once | CUTANEOUS | Status: DC

## 2016-04-21 MED ORDER — KETOROLAC TROMETHAMINE 30 MG/ML IJ SOLN
INTRAMUSCULAR | Status: AC
  Administered 2016-04-21: 30 mg via INTRAVENOUS
  Filled 2016-04-21: qty 1

## 2016-04-21 MED ORDER — HYDROCHLOROTHIAZIDE 12.5 MG PO CAPS
12.5000 mg | ORAL_CAPSULE | Freq: Every day | ORAL | Status: DC
  Administered 2016-04-22 – 2016-04-23 (×2): 12.5 mg via ORAL
  Filled 2016-04-21 (×2): qty 1

## 2016-04-21 MED ORDER — METHOCARBAMOL 1000 MG/10ML IJ SOLN
500.0000 mg | Freq: Four times a day (QID) | INTRAVENOUS | Status: DC | PRN
  Administered 2016-04-21: 500 mg via INTRAVENOUS
  Filled 2016-04-21 (×4): qty 5

## 2016-04-21 MED ORDER — ONDANSETRON HCL 4 MG/2ML IJ SOLN
INTRAMUSCULAR | Status: AC
  Filled 2016-04-21: qty 8

## 2016-04-21 MED ORDER — PROPOFOL 10 MG/ML IV BOLUS
INTRAVENOUS | Status: DC | PRN
  Administered 2016-04-21: 200 mg via INTRAVENOUS

## 2016-04-21 MED ORDER — APIXABAN 2.5 MG PO TABS: ORAL_TABLET | ORAL | 0 refills | Status: DC

## 2016-04-21 MED ORDER — PROPOFOL 10 MG/ML IV BOLUS
INTRAVENOUS | Status: AC
  Filled 2016-04-21: qty 20

## 2016-04-21 MED ORDER — FUROSEMIDE 20 MG PO TABS: 20.0000 mg | ORAL_TABLET | Freq: Every day | ORAL | Status: DC | PRN

## 2016-04-21 MED ORDER — SUCCINYLCHOLINE CHLORIDE 200 MG/10ML IV SOSY
PREFILLED_SYRINGE | INTRAVENOUS | Status: AC
  Filled 2016-04-21: qty 10

## 2016-04-21 MED ORDER — ZOLPIDEM TARTRATE 5 MG PO TABS: 5.0000 mg | ORAL_TABLET | Freq: Every evening | ORAL | Status: DC | PRN

## 2016-04-21 MED ORDER — ACETAMINOPHEN 650 MG RE SUPP: 650.0000 mg | Freq: Four times a day (QID) | RECTAL | Status: DC | PRN

## 2016-04-21 MED ORDER — GLYCOPYRROLATE 0.2 MG/ML IJ SOLN
INTRAMUSCULAR | Status: DC | PRN
  Administered 2016-04-21: 0.4 mg via INTRAVENOUS

## 2016-04-21 MED ORDER — 0.9 % SODIUM CHLORIDE (POUR BTL) OPTIME
TOPICAL | Status: DC | PRN
  Administered 2016-04-21: 1000 mL

## 2016-04-21 MED ORDER — GLYCOPYRROLATE 0.2 MG/ML IV SOSY
PREFILLED_SYRINGE | INTRAVENOUS | Status: AC
  Filled 2016-04-21: qty 3

## 2016-04-21 MED ORDER — DOCUSATE SODIUM 100 MG PO CAPS
100.0000 mg | ORAL_CAPSULE | Freq: Two times a day (BID) | ORAL | Status: DC
  Administered 2016-04-21 – 2016-04-23 (×4): 100 mg via ORAL
  Filled 2016-04-21 (×4): qty 1

## 2016-04-21 MED ORDER — HYDROMORPHONE HCL 1 MG/ML IJ SOLN
INTRAMUSCULAR | Status: AC
  Administered 2016-04-21: 0.5 mg via INTRAVENOUS
  Filled 2016-04-21: qty 1

## 2016-04-21 MED ORDER — MIDAZOLAM HCL 5 MG/5ML IJ SOLN
INTRAMUSCULAR | Status: DC | PRN
  Administered 2016-04-21: 2 mg via INTRAVENOUS

## 2016-04-21 MED ORDER — LACTATED RINGERS IV SOLN: INTRAVENOUS | Status: DC

## 2016-04-21 MED ORDER — LIDOCAINE 2% (20 MG/ML) 5 ML SYRINGE
INTRAMUSCULAR | Status: AC
  Filled 2016-04-21: qty 5

## 2016-04-21 MED ORDER — ONDANSETRON HCL 4 MG PO TABS: 4.0000 mg | ORAL_TABLET | Freq: Three times a day (TID) | ORAL | 0 refills | Status: DC | PRN

## 2016-04-21 MED ORDER — FENTANYL CITRATE (PF) 250 MCG/5ML IJ SOLN
INTRAMUSCULAR | Status: AC
  Filled 2016-04-21: qty 5

## 2016-04-21 MED ORDER — BUPIVACAINE HCL (PF) 0.5 % IJ SOLN
INTRAMUSCULAR | Status: AC
  Filled 2016-04-21: qty 30

## 2016-04-21 MED ORDER — BISACODYL 5 MG PO TBEC: 5.0000 mg | DELAYED_RELEASE_TABLET | Freq: Every day | ORAL | 0 refills | Status: DC | PRN

## 2016-04-21 MED ORDER — HYDROMORPHONE HCL 1 MG/ML IJ SOLN
0.2500 mg | INTRAMUSCULAR | Status: DC | PRN
  Administered 2016-04-21 (×4): 0.5 mg via INTRAVENOUS

## 2016-04-21 MED ORDER — SUCCINYLCHOLINE CHLORIDE 20 MG/ML IJ SOLN
INTRAMUSCULAR | Status: DC | PRN
Start: 2016-04-21 — End: 2016-04-21
  Administered 2016-04-21: 40 mg via INTRAVENOUS

## 2016-04-21 MED ORDER — MENTHOL 3 MG MT LOZG: 1.0000 | LOZENGE | OROMUCOSAL | Status: DC | PRN

## 2016-04-21 MED ORDER — SODIUM CHLORIDE 0.9 % IJ SOLN
INTRAMUSCULAR | Status: DC | PRN
  Administered 2016-04-21: 40 mL via INTRAVENOUS

## 2016-04-21 MED ORDER — CEFAZOLIN SODIUM-DEXTROSE 2-4 GM/100ML-% IV SOLN
2.0000 g | Freq: Four times a day (QID) | INTRAVENOUS | Status: AC
  Administered 2016-04-21 (×2): 2 g via INTRAVENOUS
  Filled 2016-04-21 (×2): qty 100

## 2016-04-21 MED ORDER — PHENOL 1.4 % MT LIQD
1.0000 | OROMUCOSAL | Status: DC | PRN
Start: 2016-04-21 — End: 2016-04-23

## 2016-04-21 MED ORDER — ONDANSETRON HCL 4 MG/2ML IJ SOLN
INTRAMUSCULAR | Status: DC | PRN
  Administered 2016-04-21: 4 mg via INTRAVENOUS

## 2016-04-21 MED ORDER — LOSARTAN POTASSIUM 50 MG PO TABS
50.0000 mg | ORAL_TABLET | Freq: Every day | ORAL | Status: DC
  Administered 2016-04-22 – 2016-04-23 (×2): 50 mg via ORAL
  Filled 2016-04-21 (×2): qty 1

## 2016-04-21 MED ORDER — MEPERIDINE HCL 25 MG/ML IJ SOLN: 6.2500 mg | INTRAMUSCULAR | Status: DC | PRN

## 2016-04-21 MED ORDER — MAGNESIUM CITRATE PO SOLN
1.0000 | Freq: Once | ORAL | Status: AC | PRN
  Administered 2016-04-23: 1 via ORAL
  Filled 2016-04-21: qty 296

## 2016-04-21 MED ORDER — HYDROMORPHONE HCL 1 MG/ML IJ SOLN
0.5000 mg | INTRAMUSCULAR | Status: DC | PRN
  Administered 2016-04-21 – 2016-04-23 (×7): 1 mg via INTRAVENOUS
  Filled 2016-04-21 (×8): qty 1

## 2016-04-21 MED ORDER — ONDANSETRON HCL 4 MG PO TABS: 4.0000 mg | ORAL_TABLET | Freq: Four times a day (QID) | ORAL | Status: DC | PRN

## 2016-04-21 MED ORDER — BISACODYL 10 MG RE SUPP: 10.0000 mg | Freq: Every day | RECTAL | Status: DC | PRN

## 2016-04-21 MED ORDER — DEXAMETHASONE SODIUM PHOSPHATE 10 MG/ML IJ SOLN
INTRAMUSCULAR | Status: AC
  Filled 2016-04-21: qty 1

## 2016-04-21 MED ORDER — CELECOXIB 200 MG PO CAPS
200.0000 mg | ORAL_CAPSULE | Freq: Two times a day (BID) | ORAL | Status: DC
  Administered 2016-04-21 – 2016-04-23 (×4): 200 mg via ORAL
  Filled 2016-04-21 (×4): qty 1

## 2016-04-21 MED ORDER — BUPIVACAINE LIPOSOME 1.3 % IJ SUSP
INTRAMUSCULAR | Status: DC | PRN
  Administered 2016-04-21: 20 mL

## 2016-04-21 MED ORDER — LACTATED RINGERS IV SOLN
INTRAVENOUS | Status: DC
  Administered 2016-04-21 (×3): via INTRAVENOUS

## 2016-04-21 MED ORDER — ONDANSETRON HCL 4 MG/2ML IJ SOLN
4.0000 mg | Freq: Four times a day (QID) | INTRAMUSCULAR | Status: DC | PRN
  Filled 2016-04-21: qty 2

## 2016-04-21 MED ORDER — ALUM & MAG HYDROXIDE-SIMETH 200-200-20 MG/5ML PO SUSP: 30.0000 mL | ORAL | Status: DC | PRN

## 2016-04-21 MED ORDER — PHENYLEPHRINE HCL 10 MG/ML IJ SOLN
INTRAMUSCULAR | Status: AC
  Filled 2016-04-21: qty 1

## 2016-04-21 MED ORDER — POLYETHYLENE GLYCOL 3350 17 G PO PACK
17.0000 g | PACK | Freq: Every day | ORAL | Status: DC | PRN
  Administered 2016-04-22: 17 g via ORAL
  Filled 2016-04-21: qty 1

## 2016-04-21 MED ORDER — ACETAMINOPHEN 10 MG/ML IV SOLN
INTRAVENOUS | Status: AC
  Filled 2016-04-21: qty 100

## 2016-04-21 MED ORDER — METOCLOPRAMIDE HCL 5 MG/ML IJ SOLN: 5.0000 mg | Freq: Three times a day (TID) | INTRAMUSCULAR | Status: DC | PRN

## 2016-04-21 MED ORDER — SCOPOLAMINE 1 MG/3DAYS TD PT72
MEDICATED_PATCH | TRANSDERMAL | Status: AC
  Filled 2016-04-21: qty 1

## 2016-04-21 MED ORDER — OXYCODONE-ACETAMINOPHEN 5-325 MG PO TABS: 1.0000 | ORAL_TABLET | ORAL | 0 refills | Status: DC | PRN

## 2016-04-21 MED ORDER — SCOPOLAMINE 1 MG/3DAYS TD PT72
MEDICATED_PATCH | TRANSDERMAL | Status: DC | PRN
  Administered 2016-04-21: 1 via TRANSDERMAL

## 2016-04-21 MED ORDER — BUPIVACAINE HCL (PF) 0.5 % IJ SOLN
INTRAMUSCULAR | Status: DC | PRN
  Administered 2016-04-21: 10 mL

## 2016-04-21 MED ORDER — OXYCODONE HCL 5 MG PO TABS
5.0000 mg | ORAL_TABLET | ORAL | Status: DC | PRN
  Administered 2016-04-21 – 2016-04-23 (×8): 10 mg via ORAL
  Filled 2016-04-21 (×8): qty 2

## 2016-04-21 MED ORDER — LOSARTAN POTASSIUM-HCTZ 50-12.5 MG PO TABS: 1.0000 | ORAL_TABLET | Freq: Every day | ORAL | Status: DC

## 2016-04-21 MED ORDER — METHOCARBAMOL 500 MG PO TABS
500.0000 mg | ORAL_TABLET | Freq: Four times a day (QID) | ORAL | Status: DC | PRN
  Administered 2016-04-21 – 2016-04-23 (×6): 500 mg via ORAL
  Filled 2016-04-21 (×6): qty 1

## 2016-04-21 MED ORDER — BUPIVACAINE LIPOSOME 1.3 % IJ SUSP
20.0000 mL | Freq: Once | INTRAMUSCULAR | Status: DC
  Filled 2016-04-21 (×2): qty 20

## 2016-04-21 MED ORDER — POTASSIUM CHLORIDE IN NACL 20-0.9 MEQ/L-% IV SOLN
INTRAVENOUS | Status: DC
  Administered 2016-04-21: 13:00:00 via INTRAVENOUS
  Filled 2016-04-21: qty 1000

## 2016-04-21 MED ORDER — APIXABAN 2.5 MG PO TABS
2.5000 mg | ORAL_TABLET | Freq: Two times a day (BID) | ORAL | Status: DC
  Administered 2016-04-22 – 2016-04-23 (×3): 2.5 mg via ORAL
  Filled 2016-04-21 (×3): qty 1

## 2016-04-21 MED ORDER — ACETAMINOPHEN 325 MG PO TABS
650.0000 mg | ORAL_TABLET | Freq: Four times a day (QID) | ORAL | Status: DC | PRN
Start: 2016-04-21 — End: 2016-04-23
  Administered 2016-04-22: 650 mg via ORAL
  Filled 2016-04-21: qty 2

## 2016-04-21 MED ORDER — DEXAMETHASONE SODIUM PHOSPHATE 10 MG/ML IJ SOLN
10.0000 mg | Freq: Once | INTRAMUSCULAR | Status: AC
  Administered 2016-04-22: 10 mg via INTRAVENOUS
  Filled 2016-04-21: qty 1

## 2016-04-21 MED ORDER — FENTANYL CITRATE (PF) 100 MCG/2ML IJ SOLN
INTRAMUSCULAR | Status: DC | PRN
  Administered 2016-04-21 (×5): 25 ug via INTRAVENOUS
  Administered 2016-04-21: 50 ug via INTRAVENOUS
  Administered 2016-04-21 (×4): 25 ug via INTRAVENOUS
  Administered 2016-04-21: 50 ug via INTRAVENOUS
  Administered 2016-04-21 (×3): 25 ug via INTRAVENOUS

## 2016-04-21 MED ORDER — DEXAMETHASONE SODIUM PHOSPHATE 4 MG/ML IJ SOLN
INTRAMUSCULAR | Status: DC | PRN
  Administered 2016-04-21: 10 mg via INTRAVENOUS

## 2016-04-21 MED ORDER — METOCLOPRAMIDE HCL 5 MG PO TABS
5.0000 mg | ORAL_TABLET | Freq: Three times a day (TID) | ORAL | Status: DC | PRN
Start: 2016-04-21 — End: 2016-04-23

## 2016-04-21 MED ORDER — PHENYLEPHRINE HCL 10 MG/ML IJ SOLN
INTRAMUSCULAR | Status: DC | PRN
  Administered 2016-04-21 (×2): 40 ug via INTRAVENOUS

## 2016-04-21 MED ORDER — LIDOCAINE HCL (CARDIAC) 20 MG/ML IV SOLN
INTRAVENOUS | Status: DC | PRN
  Administered 2016-04-21: 100 mg via INTRAVENOUS

## 2016-04-21 MED ORDER — DIPHENHYDRAMINE HCL 12.5 MG/5ML PO ELIX
12.5000 mg | ORAL_SOLUTION | ORAL | Status: DC | PRN
  Administered 2016-04-22 – 2016-04-23 (×3): 25 mg via ORAL
  Filled 2016-04-21 (×3): qty 10

## 2016-04-21 MED ORDER — KETOROLAC TROMETHAMINE 30 MG/ML IJ SOLN
30.0000 mg | Freq: Once | INTRAMUSCULAR | Status: AC
  Administered 2016-04-21: 30 mg via INTRAVENOUS

## 2016-04-21 MED ORDER — HYDROMORPHONE HCL 1 MG/ML IJ SOLN
0.5000 mg | INTRAMUSCULAR | Status: AC | PRN
  Administered 2016-04-21 (×2): 0.5 mg via INTRAVENOUS

## 2016-04-21 MED ORDER — ACETAMINOPHEN 10 MG/ML IV SOLN
1000.0000 mg | Freq: Once | INTRAVENOUS | Status: AC
  Administered 2016-04-21: 1000 mg via INTRAVENOUS

## 2016-04-21 SURGICAL SUPPLY — 64 items
BLADE SAW SGTL 18X1.27X75 (BLADE) ×2 IMPLANT
BLADE SURG ROTATE 9660 (MISCELLANEOUS) IMPLANT
CAPT HIP TOTAL 2 ×2 IMPLANT
CELLS DAT CNTRL 66122 CELL SVR (MISCELLANEOUS) ×1 IMPLANT
COVER PERINEAL POST (MISCELLANEOUS) ×2 IMPLANT
COVER SURGICAL LIGHT HANDLE (MISCELLANEOUS) ×2 IMPLANT
DRAPE C-ARM 42X72 X-RAY (DRAPES) ×2 IMPLANT
DRAPE IMP U-DRAPE 54X76 (DRAPES) ×6 IMPLANT
DRAPE INCISE IOBAN 66X45 STRL (DRAPES) ×2 IMPLANT
DRAPE ORTHO SPLIT 77X108 STRL (DRAPES) ×2
DRAPE PROXIMA HALF (DRAPES) ×4 IMPLANT
DRAPE STERI IOBAN 125X83 (DRAPES) ×2 IMPLANT
DRAPE SURG 17X23 STRL (DRAPES) ×2 IMPLANT
DRAPE SURG ORHT 6 SPLT 77X108 (DRAPES) ×2 IMPLANT
DRAPE U-SHAPE 47X51 STRL (DRAPES) ×2 IMPLANT
DRESSING AQUACEL AG SP 3.5X10 (GAUZE/BANDAGES/DRESSINGS) ×1 IMPLANT
DRSG AQUACEL AG ADV 3.5X10 (GAUZE/BANDAGES/DRESSINGS) ×2 IMPLANT
DRSG AQUACEL AG SP 3.5X10 (GAUZE/BANDAGES/DRESSINGS) ×2
DURAPREP 26ML APPLICATOR (WOUND CARE) ×2 IMPLANT
ELECT BLADE 4.0 EZ CLEAN MEGAD (MISCELLANEOUS)
ELECT CAUTERY BLADE 6.4 (BLADE) ×2 IMPLANT
ELECT REM PT RETURN 9FT ADLT (ELECTROSURGICAL) ×2
ELECTRODE BLDE 4.0 EZ CLN MEGD (MISCELLANEOUS) IMPLANT
ELECTRODE REM PT RTRN 9FT ADLT (ELECTROSURGICAL) ×1 IMPLANT
FACESHIELD WRAPAROUND (MASK) ×6 IMPLANT
GLOVE BIOGEL PI IND STRL 6 (GLOVE) ×1 IMPLANT
GLOVE BIOGEL PI IND STRL 7.0 (GLOVE) IMPLANT
GLOVE BIOGEL PI INDICATOR 6 (GLOVE) ×1
GLOVE BIOGEL PI INDICATOR 7.0 (GLOVE)
GLOVE ECLIPSE 7.0 STRL STRAW (GLOVE) IMPLANT
GLOVE ORTHO TXT STRL SZ7.5 (GLOVE) ×4 IMPLANT
GLOVE SURG SS PI 6.0 STRL IVOR (GLOVE) ×4 IMPLANT
GLOVE SURG SS PI 8.0 STRL IVOR (GLOVE) ×4 IMPLANT
GOWN STRL REUS W/ TWL LRG LVL3 (GOWN DISPOSABLE) ×3 IMPLANT
GOWN STRL REUS W/ TWL XL LVL3 (GOWN DISPOSABLE) ×1 IMPLANT
GOWN STRL REUS W/TWL LRG LVL3 (GOWN DISPOSABLE) ×3
GOWN STRL REUS W/TWL XL LVL3 (GOWN DISPOSABLE) ×1
KIT BASIN OR (CUSTOM PROCEDURE TRAY) ×2 IMPLANT
KIT ROOM TURNOVER OR (KITS) ×2 IMPLANT
MANIFOLD NEPTUNE II (INSTRUMENTS) ×2 IMPLANT
NDL SAFETY ECLIPSE 18X1.5 (NEEDLE) ×1 IMPLANT
NEEDLE HYPO 18GX1.5 SHARP (NEEDLE) ×1
NS IRRIG 1000ML POUR BTL (IV SOLUTION) ×2 IMPLANT
PACK TOTAL JOINT (CUSTOM PROCEDURE TRAY) ×2 IMPLANT
PACK UNIVERSAL I (CUSTOM PROCEDURE TRAY) ×2 IMPLANT
PAD ARMBOARD 7.5X6 YLW CONV (MISCELLANEOUS) ×4 IMPLANT
RTRCTR WOUND ALEXIS 18CM MED (MISCELLANEOUS) ×2
STRIP CLOSURE SKIN 1/2X4 (GAUZE/BANDAGES/DRESSINGS) ×2 IMPLANT
SUCTION FRAZIER HANDLE 10FR (MISCELLANEOUS) ×1
SUCTION TUBE FRAZIER 10FR DISP (MISCELLANEOUS) ×1 IMPLANT
SUT MNCRL AB 4-0 PS2 18 (SUTURE) ×2 IMPLANT
SUT VIC AB 0 CT1 27 (SUTURE) ×1
SUT VIC AB 0 CT1 27XBRD ANBCTR (SUTURE) ×1 IMPLANT
SUT VIC AB 1 CT1 27 (SUTURE)
SUT VIC AB 1 CT1 27XBRD ANBCTR (SUTURE) IMPLANT
SUT VIC AB 2-0 CT1 27 (SUTURE) ×2
SUT VIC AB 2-0 CT1 TAPERPNT 27 (SUTURE) ×2 IMPLANT
SYR 50ML LL SCALE MARK (SYRINGE) ×2 IMPLANT
SYR CONTROL 10ML LL (SYRINGE) ×2 IMPLANT
TAPE STRIPS DRAPE STRL (GAUZE/BANDAGES/DRESSINGS) ×4 IMPLANT
TOWEL OR 17X24 6PK STRL BLUE (TOWEL DISPOSABLE) ×2 IMPLANT
TOWEL OR 17X26 10 PK STRL BLUE (TOWEL DISPOSABLE) ×2 IMPLANT
TRAY FOLEY CATH 14FR (SET/KITS/TRAYS/PACK) IMPLANT
WATER STERILE IRR 1000ML POUR (IV SOLUTION) ×2 IMPLANT

## 2016-04-21 NOTE — H&P (View-Only) (Signed)
TOTAL HIP ADMISSION H&P  Patient is admitted for right total hip arthroplasty.  Subjective:  Chief Complaint: right hip pain  HPI: Ashley [name], 40 y.o. female, has a history of pain and functional disability in the right hip(s) due to arthritis and patient has failed non-surgical conservative treatments for greater than 12 weeks to include NSAID's and/or analgesics, corticosteriod injections and activity modification.  Onset of symptoms was gradual starting 5 years ago with rapidlly worsening course since that time.The patient noted no past surgery on the right hip(s).  Patient currently rates pain in the right hip at 5 out of 10 with activity. Patient has night pain, worsening of pain with activity and weight bearing, trendelenberg gait, pain that interfers with activities of daily living and pain with passive range of motion. Patient has evidence of subchondral sclerosis and joint space narrowing by imaging studies. This condition presents safety issues increasing the risk of falls.  There is no current active infection.  Patient Active Problem List   Diagnosis Date Noted  . Sinusitis 06/25/2015  . Cough 06/25/2015  . Essential hypertension 04/03/2015  . Dehydration 04/03/2015  . CAP (community acquired pneumonia) 03/31/2015  . Right lower lobe pneumonia 03/31/2015  . Fever 03/31/2015  . Pregnancy 04/29/2014  . Miscarriage   . Incomplete miscarriage 04/02/2011    Class: Acute  . 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 PCOS (polycystic ovarian syndrome)   . Arthritis     knees, back  . 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 Hypertension     Past Surgical History  Procedure Laterality Date  . Ankle surgery      x 2  . Dilation and evacuation  04/02/2011    Procedure: DILATATION AND EVACUATION (D&E);  Surgeon: Luz Lex, MD;  Location: [facility];  Service: Gynecology;  Laterality: N/A;  dvt  left mid thigh  . Shoulder arthroscopy Right   . 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 Hysteroscopy w/d&c  07/17/2010    with exc. endometrial polyps  . Total knee arthroplasty Right 08/26/2008  . Endoscopic plantar fasciotomy Left 02/01/2002  . Toe surgery Left 01/10/2003    claw toe correction 2nd, 3rd, 4th toes  . Knee arthroscopy Right 03/07/2003; 01/14/2005; 08/31/2006  . Knee arthroscopy Left 03/21/2003; 11/26/2004  . Carpal tunnel release Left 07/04/2014    Procedure: LEFT CARPAL TUNNEL RELEASE;  Surgeon: Ninetta Lights, MD;  Location: [facility];  Service: Orthopedics;  Laterality: Left;  . Dorsal compartment release Left 07/04/2014    Procedure: LEFT DEQUERVAINS;  Surgeon: Ninetta Lights, MD;  Location: [facility];  Service: Orthopedics;  Laterality: Left;  . Carpal tunnel release Right 08/08/2014    Procedure: RIGHT CARPAL TUNNEL RELEASE;  Surgeon: Ninetta Lights, MD;  Location: [facility];  Service: Orthopedics;  Laterality: Right;     (Not in a hospital admission) Allergies  Allergen Reactions  . Codeine Itching and Nausea Only  . Cymbalta [Duloxetine Hcl] Other (See Comments)    Altered mental status    Social History  Substance Use Topics  . Smoking status: Never Smoker   . Smokeless tobacco: Never Used  . Alcohol Use: No    Family History  Problem Relation Age of Onset  . Diabetes Father   . Cancer Father     COLON  . Heart disease Father   .  Kidney disease Father     dialysis  . Diabetes Sister   . Cancer Maternal Grandmother     UTERINE  . Cancer Paternal Grandmother     breast     Review of Systems  Constitutional: Negative.   HENT: Negative.   Eyes: Negative.   Respiratory: Negative.   Cardiovascular: Negative.   Gastrointestinal: Negative.   Genitourinary: Negative.   Musculoskeletal: Positive for back pain and joint pain.  Skin: Negative.   Neurological: Negative.   Endo/Heme/Allergies: Negative.    Psychiatric/Behavioral: Negative.     Objective:  Physical Exam  Constitutional: She is oriented to person, place, and time. She appears well-developed and well-nourished.  HENT:  Head: Normocephalic and atraumatic.  Eyes: EOM are normal. Pupils are equal, round, and reactive to light.  Neck: Normal range of motion. Neck supple.  Cardiovascular: Normal rate and regular rhythm.   Respiratory: Effort normal and breath sounds normal.  GI: Soft. Bowel sounds are normal.  Musculoskeletal:  Examination of the right lower extremity reveals a positive straight leg roll with a positive straight leg raise. She has decreased internal and external rotation on the right. Neurovascularly intact distally.     Neurological: She is alert and oriented to person, place, and time.  Skin: Skin is warm and dry.  Psychiatric: She has a normal mood and affect. Her behavior is normal. Judgment and thought content normal.    Vital signs in last 24 hours: @VSRANGES @  Labs:   Estimated body mass index is 38.93 kg/(m^2) as calculated from the following:   Height as of 11/30/15: 6\' 1"  (1.854 m).   Weight as of 02/07/16: 133.811 kg (295 lb).   Imaging Review Plain radiographs demonstrate severe degenerative joint disease of the right hip(s). The bone quality appears to be fair for age and reported activity level.  Assessment/Plan:  End stage arthritis, right hip(s)  The patient history, physical examination, clinical judgement of the provider and imaging studies are consistent with end stage degenerative joint disease of the right hip(s) and total hip arthroplasty is deemed medically necessary. The treatment options including medical management, injection therapy, arthroscopy and arthroplasty were discussed at length. The risks and benefits of total hip arthroplasty were presented and reviewed. The risks due to aseptic loosening, infection, stiffness, dislocation/subluxation,  thromboembolic complications  and other imponderables were discussed.  The patient acknowledged the explanation, agreed to proceed with the plan and consent was signed. Patient is being admitted for inpatient treatment for surgery, pain control, PT, OT, prophylactic antibiotics, VTE prophylaxis, progressive ambulation and ADL's and discharge planning.The patient is planning to be discharged home with home health services

## 2016-04-21 NOTE — Discharge Summary (Addendum)
Patient ID: Ashley [name] MRN: [id number] DOB/AGE: November 07, 1975 40 y.o.  Admit date: 04/21/2016 Discharge date: 04/23/2016  Admission Diagnoses:  Active Problems:   Primary localized osteoarthritis of right hip   Discharge Diagnoses:  Same  Past Medical History:  Diagnosis Date  . Arthritis    knees, back  . 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 Complication of anesthesia   . History of pneumonia   . Hypertension   . PCOS (polycystic ovarian syndrome)   . PONV (postoperative nausea and vomiting)     Surgeries: Procedure(s): TOTAL HIP ARTHROPLASTY ANTERIOR APPROACH on 04/21/2016   Consultants:   Discharged Condition: Improved  Hospital Course: Ashley [name] is an 40 y.o. female who was admitted 04/21/2016 for operative treatment of primary localized osteoarthritis right hip. Patient has severe unremitting pain that affects sleep, daily activities, and work/hobbies. After pre-op clearance the patient was taken to the operating room on 04/21/2016 and underwent  Procedure(s): TOTAL HIP ARTHROPLASTY ANTERIOR APPROACH.  Patient with a pre-op Hb of 12.4 developed abla on pod #1 with a Hb of 9.9 and 9.2 on pod#2.  She is currently stable but we will continue to follow.  Patient was given perioperative antibiotics:  Anti-infectives    Start     Dose/Rate Route Frequency Ordered Stop   04/21/16 1430  ceFAZolin (ANCEF) IVPB 2g/100 mL premix     2 g 200 mL/hr over 30 Minutes Intravenous Every 6 hours 04/21/16 1250 04/21/16 2157   04/21/16 0600  ceFAZolin (ANCEF) 3 g in dextrose 5 % 50 mL IVPB     3 g 130 mL/hr over 30 Minutes Intravenous On call to O.R. 04/20/16 1401 04/21/16 0907       Patient was given sequential compression devices, early ambulation, and chemoprophylaxis to prevent DVT.  Patient benefited maximally from hospital stay and there were no complications.    Recent vital signs:  Patient Vitals for the past 24 hrs:  BP Temp Temp src  Pulse Resp SpO2  04/23/16 0615 (!) 115/59 98.2 F (36.8 C) Oral 64 - 96 %  04/22/16 2027 127/82 98.2 F (36.8 C) Oral 87 - 97 %  04/22/16 1300 136/67 98.1 F (36.7 C) Oral 80 16 99 %     Recent laboratory studies:   Recent Labs  04/22/16 0440 04/23/16 0323  WBC 11.3* 12.2*  HGB 9.9* 9.2*  HCT 31.3* 29.3*  PLT 182 175  NA 137 138  K 4.5 3.9  CL 105 103  CO2 26 29  BUN 9 13  CREATININE 0.75 0.88  GLUCOSE 132* 122*  CALCIUM 8.3* 8.4*     Discharge Medications:     Medication List    STOP taking these medications   diazepam 5 MG tablet Commonly known as:  VALIUM   HYDROcodone-acetaminophen 5-325 MG tablet Commonly known as:  NORCO/VICODIN   meloxicam 15 MG tablet Commonly known as:  MOBIC   naproxen 500 MG tablet Commonly known as:  NAPROSYN     TAKE these medications   apixaban 2.5 MG Tabs tablet Commonly known as:  ELIQUIS Take 1 tab po q12 hours x 35 days following surgery to prevent blood clots   bisacodyl 5 MG EC tablet Commonly known as:  DULCOLAX Take 1 tablet (5 mg total) by mouth daily as needed for moderate constipation.   cyclobenzaprine 10 MG tablet Commonly known as:  FLEXERIL Take 1 tablet (10 mg total) by mouth at bedtime.   furosemide 20 MG tablet Commonly  known as:  LASIX Take 20 mg by mouth daily as needed for fluid.   losartan-hydrochlorothiazide 50-12.5 MG tablet Commonly known as:  HYZAAR Take 1 tablet by mouth daily.   ondansetron 4 MG tablet Commonly known as:  ZOFRAN Take 1 tablet (4 mg total) by mouth every 8 (eight) hours as needed for nausea or vomiting.   oxyCODONE-acetaminophen 5-325 MG tablet Commonly known as:  PERCOCET Take 1-2 tablets by mouth every 4 (four) hours as needed for severe pain.       Diagnostic Studies: Dg C-arm 61-120 Min  Result Date: 04/21/2016 CLINICAL DATA:  Right hip surgery EXAM: OPERATIVE right HIP (WITH PELVIS IF PERFORMED) 2 VIEWS TECHNIQUE: Fluoroscopic spot image(s) were submitted  for interpretation post-operatively. COMPARISON:  None. FINDINGS: Right hip replacement is noted. No acute bony or soft tissue abnormality is seen. 20 seconds of fluoroscopy was utilized IMPRESSION: Right hip replacement Electronically Signed   By: Inez Catalina M.D.   On: 04/21/2016 11:12   Dg Hip Operative Unilat W Or W/o Pelvis Right  Result Date: 04/21/2016 CLINICAL DATA:  Right hip surgery EXAM: OPERATIVE right HIP (WITH PELVIS IF PERFORMED) 2 VIEWS TECHNIQUE: Fluoroscopic spot image(s) were submitted for interpretation post-operatively. COMPARISON:  None. FINDINGS: Right hip replacement is noted. No acute bony or soft tissue abnormality is seen. 20 seconds of fluoroscopy was utilized IMPRESSION: Right hip replacement Electronically Signed   By: Inez Catalina M.D.   On: 04/21/2016 11:12    Disposition: 01-Home or Self Care    Follow-up Information    Ninetta Lights, MD. Schedule an appointment as soon as possible for a visit in 2 week(s).   Specialty:  Orthopedic Surgery Contact information: [address] [place] [address] [phone]        [facility] .   Why:  Someone from [place]( formerly [place]),  will contact you to arrange start date and time for therapy. Contact information: [address] [place] [address] [phone]            Signed: Fannie Knee 04/23/2016, 8:20 AM

## 2016-04-21 NOTE — Discharge Instructions (Addendum)
INSTRUCTIONS AFTER JOINT REPLACEMENT   o Remove items at home which could result in a fall. This includes throw rugs or furniture in walking pathways o ICE to the affected joint every three hours while awake for 30 minutes at a time, for at least the first 3-5 days, and then as needed for pain and swelling.  Continue to use ice for pain and swelling. You may notice swelling that will progress down to the foot and ankle.  This is normal after surgery.  Elevate your leg when you are not up walking on it.   o Continue to use the breathing machine you got in the hospital (incentive spirometer) which will help keep your temperature down.  It is common for your temperature to cycle up and down following surgery, especially at night when you are not up moving around and exerting yourself.  The breathing machine keeps your lungs expanded and your temperature down.   DIET:  As you were doing prior to hospitalization, we recommend a well-balanced diet.  Information on my medicine - ELIQUIS (apixaban)  This medication education was reviewed with me or my healthcare representative as part of my discharge preparation.  The pharmacist that spoke with me during my hospital stay was:  Georgina Peer, [facility]  Why was Eliquis prescribed for you? Eliquis was prescribed for you to reduce the risk of blood clots forming after orthopedic surgery.    What do You need to know about Eliquis? Take your Eliquis TWICE DAILY - one tablet in the morning and one tablet in the evening with or without food.  It would be best to take the dose about the same time each day.  If you have difficulty swallowing the tablet whole please discuss with your pharmacist how to take the medication safely.  Take Eliquis exactly as prescribed by your doctor and DO NOT stop taking Eliquis without talking to the doctor who prescribed the medication.  Stopping without other medication to take the place of Eliquis may increase your risk  of developing a clot.  After discharge, you should have regular check-up appointments with your healthcare provider that is prescribing your Eliquis.  What do you do if you miss a dose? If a dose of ELIQUIS is not taken at the scheduled time, take it as soon as possible on the same day and twice-daily administration should be resumed.  The dose should not be doubled to make up for a missed dose.  Do not take more than one tablet of ELIQUIS at the same time.  Important Safety Information A possible side effect of Eliquis is bleeding. You should call your healthcare provider right away if you experience any of the following: ? Bleeding from an injury or your nose that does not stop. ? Unusual colored urine (red or dark brown) or unusual colored stools (red or black). ? Unusual bruising for unknown reasons. ? A serious fall or if you hit your head (even if there is no bleeding).  Some medicines may interact with Eliquis and might increase your risk of bleeding or clotting while on Eliquis. To help avoid this, consult your healthcare provider or pharmacist prior to using any new prescription or non-prescription medications, including herbals, vitamins, non-steroidal anti-inflammatory drugs (NSAIDs) and supplements.  This website has more information on Eliquis (apixaban): http://www.eliquis.com/eliquis/home   DRESSING / WOUND CARE / SHOWERING  Keep the surgical dressing until follow up.  The dressing is water proof, so you can shower without any extra covering.  IF THE DRESSING FALLS OFF or the wound gets wet inside, change the dressing with sterile gauze.  Please use good hand washing techniques before changing the dressing.  Do not use any lotions or creams on the incision until instructed by your surgeon.    ACTIVITY  o Increase activity slowly as tolerated, but follow the weight bearing instructions below.   o No driving for 6 weeks or until further direction given by your physician.   You cannot drive while taking narcotics.  o No lifting or carrying greater than 10 lbs. until further directed by your surgeon. o Avoid periods of inactivity such as sitting longer than an hour when not asleep. This helps prevent blood clots.  o You may return to work once you are authorized by your doctor.     WEIGHT BEARING   Weight bearing as tolerated with assist device (walker, cane, etc) as directed, use it as long as suggested by your surgeon or therapist, typically at least 4-6 weeks.  CONSTIPATION  Constipation is defined medically as fewer than three stools per week and severe constipation as less than one stool per week.  Even if you have a regular bowel pattern at home, your normal regimen is likely to be disrupted due to multiple reasons following surgery.  Combination of anesthesia, postoperative narcotics, change in appetite and fluid intake all can affect your bowels.   YOU MUST use at least one of the following options; they are listed in order of increasing strength to get the job done.  They are all available over the counter, and you may need to use some, POSSIBLY even all of these options:    Drink plenty of fluids (prune juice may be helpful) and high fiber foods Colace 100 mg by mouth twice a day  Senokot for constipation as directed and as needed Dulcolax (bisacodyl), take with full glass of water  Miralax (polyethylene glycol) once or twice a day as needed.  If you have tried all these things and are unable to have a bowel movement in the first 3-4 days after surgery call either your surgeon or your primary doctor.    If you experience loose stools or diarrhea, hold the medications until you stool forms back up.  If your symptoms do not get better within 1 week or if they get worse, check with your doctor.  If you experience "the worst abdominal pain ever" or develop nausea or vomiting, please contact the office immediately for further recommendations for  treatment.   ITCHING:  If you experience itching with your medications, try taking only a single pain pill, or even half a pain pill at a time.  You can also use Benadryl over the counter for itching or also to help with sleep.   TED HOSE STOCKINGS:  Use stockings on both legs until for at least 2 weeks or as directed by physician office. They may be removed at night for sleeping.  MEDICATIONS:  See your medication summary on the After Visit Summary that nursing will review with you.  You may have some home medications which will be placed on hold until you complete the course of blood thinner medication.  It is important for you to complete the blood thinner medication as prescribed.  PRECAUTIONS:  If you experience chest pain or shortness of breath - call 911 immediately for transfer to the hospital emergency department.   If you develop a fever greater that 101 F, purulent drainage from wound, increased redness  or drainage from wound, foul odor from the wound/dressing, or calf pain - CONTACT YOUR SURGEON.                                                   FOLLOW-UP APPOINTMENTS:  If you do not already have a post-op appointment, please call the office for an appointment to be seen by your surgeon.  Guidelines for how soon to be seen are listed in your After Visit Summary, but are typically between 1-4 weeks after surgery.  OTHER INSTRUCTIONS:   Knee Replacement:  Do not place pillow under knee, focus on keeping the knee straight while resting. CPM instructions: 0-90 degrees, 2 hours in the morning, 2 hours in the afternoon, and 2 hours in the evening. Place foam block, curve side up under heel at all times except when in CPM or when walking.  DO NOT modify, tear, cut, or change the foam block in any way.  MAKE SURE YOU:   Understand these instructions.   Get help right away if you are not doing well or get worse.    Thank you for letting us be a part of your medical care team.  It is  a privilege we respect greatly.  We hope these instructions will help you stay on track for a fast and full recovery!    Information on my medicine - ELIQUIS (apixaban)  This medication education was reviewed with me or my healthcare representative as part of my discharge preparation.  The pharmacist that spoke with me during my hospital stay was:  Severiano Gilbert, [facility]  Why was Eliquis prescribed for you? Eliquis was prescribed for you to reduce the risk of blood clots forming after orthopedic surgery.    What do You need to know about Eliquis? Take your Eliquis TWICE DAILY - one tablet in the morning and one tablet in the evening with or without food.  It would be best to take the dose about the same time each day.  If you have difficulty swallowing the tablet whole please discuss with your pharmacist how to take the medication safely.  Take Eliquis exactly as prescribed by your doctor and DO NOT stop taking Eliquis without talking to the doctor who prescribed the medication.  Stopping without other medication to take the place of Eliquis may increase your risk of developing a clot.  After discharge, you should have regular check-up appointments with your healthcare provider that is prescribing your Eliquis.  What do you do if you miss a dose? If a dose of ELIQUIS is not taken at the scheduled time, take it as soon as possible on the same day and twice-daily administration should be resumed.  The dose should not be doubled to make up for a missed dose.  Do not take more than one tablet of ELIQUIS at the same time.  Important Safety Information A possible side effect of Eliquis is bleeding. You should call your healthcare provider right away if you experience any of the following: ? Bleeding from an injury or your nose that does not stop. ? Unusual colored urine (red or dark brown) or unusual colored stools (red or black). ? Unusual bruising for unknown reasons. ? A serious  fall or if you hit your head (even if there is no bleeding).  Some medicines may interact with Eliquis  and might increase your risk of bleeding or clotting while on Eliquis. To help avoid this, consult your healthcare provider or pharmacist prior to using any new prescription or non-prescription medications, including herbals, vitamins, non-steroidal anti-inflammatory drugs (NSAIDs) and supplements.  This website has more information on Eliquis (apixaban): http://www.eliquis.com/eliquis/home

## 2016-04-21 NOTE — Evaluation (Signed)
Physical Therapy Evaluation Patient Details Name: FALEN BAGNALL MRN: [id number] DOB: 05/23/1976 Today's Date: 04/21/2016   History of Present Illness  40 y.o. female now s/p rt anterior THA. PMH:  hypertension, bilateral TKA.   Clinical Impression  Pt moving slowly during initial PT evaluation. Initially pt refusing to participate but willing to attempt to get up to use BSC. Son present and supportive during session. Pt reporting that she is planning to D/C to home with her sons (x2) assistance. PT did discuss needed to participate with PT and to progress mobility to safely D/C to home. Recommending D/C to home at this time but pt will need to progress mobility during following sessions. Modifications to recommendations will be made as better able to assess the patient's progress during following sessions.     Follow Up Recommendations Home health PT;Supervision for mobility/OOB    Equipment Recommendations  Rolling walker with 5" wheels (bariatric)   Recommendations for Other Services       Precautions / Restrictions Precautions Precautions: Anterior Hip;Fall Precaution Booklet Issued: Yes (comment) Precaution Comments: HEP provided, reviewed/posted anterior THA precautions Restrictions Weight Bearing Restrictions: Yes RLE Weight Bearing: Weight bearing as tolerated      Mobility  Bed Mobility Overal bed mobility: Needs Assistance Bed Mobility: Supine to Sit     Supine to sit: Mod assist;HOB elevated Sit to supine: +2 for physical assistance;Max assist   General bed mobility comments: Cues needed for sequence getting out of bed, encouraging use of rail.   Transfers Overall transfer level: Needs assistance Equipment used: Rolling walker (2 wheeled) Transfers: Sit to/from [facility] Sit to Stand: Mod assist;+2 physical assistance;Min assist Stand pivot transfers: Mod assist (cues for sequence)       General transfer comment: cues and  repeated encouragement needed throughout session. Able to perform stand pivot from bed<>BSC.   Ambulation/Gait             General Gait Details: unable to safety attempt  Stairs            Wheelchair Mobility    Modified Rankin (Stroke Patients Only)       Balance Overall balance assessment: Needs assistance Sitting-balance support: Single extremity supported Sitting balance-Leahy Scale: Fair     Standing balance support: Bilateral upper extremity supported Standing balance-Leahy Scale: Poor Standing balance comment: using rw                             Pertinent Vitals/Pain Pain Assessment: Faces Faces Pain Scale: Hurts even more Pain Location: Rt hip Pain Descriptors / Indicators: Guarding;Grimacing;Moaning Pain Intervention(s): Limited activity within patient's tolerance;Monitored during session    Home Living Family/patient expects to be discharged to:: Private residence Living Arrangements: Alone Available Help at Discharge: Family;Available 24 hours/day (sons) Type of Home: House Home Access: Level entry     Home Layout: One level Home Equipment: Bedside commode Additional Comments: Will have 2 of her sons staying with her.     Prior Function Level of Independence: Independent               Hand Dominance        Extremity/Trunk Assessment   Upper Extremity Assessment: Overall WFL for tasks assessed           Lower Extremity Assessment: RLE deficits/detail RLE Deficits / Details: poor active motion, pt hesitant to move with reports of pain.        Communication  Communication: No difficulties  Cognition Arousal/Alertness: Lethargic;Suspect due to medications Behavior During Therapy: Flat affect Overall Cognitive Status: Within Functional Limits for tasks assessed                      General Comments General comments (skin integrity, edema, etc.): Son [facility]) present throughout session and  encouraging pt participation.  Pt reporting pain but sleeping comfortably after session.     Exercises        Assessment/Plan    PT Assessment Patient needs continued PT services  PT Diagnosis Difficulty walking   PT Problem List Decreased strength;Decreased range of motion;Decreased activity tolerance;Decreased balance;Decreased mobility  PT Treatment Interventions DME instruction;Gait training;Functional mobility training;Therapeutic activities;Therapeutic exercise;Balance training;Stair training;Patient/family education   PT Goals (Current goals can be found in the Care Plan section) Acute Rehab PT Goals Patient Stated Goal: go home PT Goal Formulation: With patient Time For Goal Achievement: 05/05/16 Potential to Achieve Goals: Good    Frequency 7X/week   Barriers to discharge        Co-evaluation               End of Session Equipment Utilized During Treatment: Gait belt Activity Tolerance: Patient limited by pain Patient left: in bed;with call bell/phone within reach;with family/visitor present;with SCD's reapplied Nurse Communication: Mobility status         Time: [id number] PT Time Calculation (min) (ACUTE ONLY): 48 min   Charges:   PT Evaluation $PT Eval Moderate Complexity: 1 Procedure PT Treatments $Therapeutic Activity: 23-37 mins   PT G Codes:        Cassell Clement, PT, CSCS Pager [phone] Office [phone]  04/21/2016, 4:33 PM

## 2016-04-21 NOTE — Anesthesia Postprocedure Evaluation (Signed)
Anesthesia Post Note  Patient: KAYDE ARANA  Procedure(s) Performed: Procedure(s) (LRB): TOTAL HIP ARTHROPLASTY ANTERIOR APPROACH (Right)  Patient location during evaluation: PACU Anesthesia Type: General Level of consciousness: awake and alert Pain management: pain level controlled Vital Signs Assessment: post-procedure vital signs reviewed and stable Respiratory status: spontaneous breathing, nonlabored ventilation, respiratory function stable and patient connected to nasal cannula oxygen Cardiovascular status: blood pressure returned to baseline and stable Postop Assessment: no signs of nausea or vomiting Anesthetic complications: no    Last Vitals:  Vitals:   04/21/16 1245 04/21/16 1416  BP: (!) 150/75 129/87  Pulse: 95 90  Resp: 14 16  Temp: 36.7 C     Last Pain:  Vitals:   04/21/16 1245  TempSrc: Oral  PainSc: Mondovi Daemian Gahm

## 2016-04-21 NOTE — Transfer of Care (Signed)
Immediate Anesthesia Transfer of Care Note  Patient: Ashley [name]  Procedure(s) Performed: Procedure(s) (LRB): TOTAL HIP ARTHROPLASTY ANTERIOR APPROACH (Right)  Patient Location: PACU  Anesthesia Type: General  Level of Consciousness: awake, sedated, patient cooperative and responds to stimulation  Airway & Oxygen Therapy: Patient Spontanous Breathing and Patient connected to face mask oxygen  Post-op Assessment: Report given to PACU RN, Post -op Vital signs reviewed and stable and Patient moving all extremities  Post vital signs: Reviewed and stable  Complications: No apparent anesthesia complications

## 2016-04-21 NOTE — Anesthesia Procedure Notes (Signed)
Procedure Name: LMA Insertion Date/Time: 04/21/2016 8:50 AM Performed by: Justice Rocher Pre-anesthesia Checklist: Patient identified, Emergency Drugs available, Suction available and Patient being monitored Patient Re-evaluated:Patient Re-evaluated prior to inductionOxygen Delivery Method: Circle system utilized Preoxygenation: Pre-oxygenation with 100% oxygen Intubation Type: IV induction Ventilation: Mask ventilation without difficulty LMA: LMA inserted LMA Size: 4.0 Number of attempts: 1 Airway Equipment and Method: Bite block Placement Confirmation: positive ETCO2 Tube secured with: Tape Dental Injury: Teeth and Oropharynx as per pre-operative assessment

## 2016-04-21 NOTE — Op Note (Signed)
[name]  Ashley [name], Ashley [name] NO.:  [id number]  MEDICAL RECORD NO.:  [id number]  LOCATION:  5N12C                        FACILITY:  [facility]  PHYSICIAN:  Ninetta Lights, M.D. DATE OF BIRTH:  04-08-1976  DATE OF PROCEDURE:  04/21/2016 DATE OF DISCHARGE:                              OPERATIVE REPORT   PREOPERATIVE DIAGNOSIS:  Right hip end-stage arthritis, primary generalized.  POSTOPERATIVE DIAGNOSIS:  Right hip end-stage arthritis, primary generalized.  PROCEDURE:  Right direct anterior total hip replacement utilizing Stryker prosthesis.  Press-fit 52 mm cup screw fixation x2.  A 36 mm internal diameter liner.  Press-fit #5 Accolade stem with a 36 -5 BIOLOX head.  SURGEON:  Ninetta Lights, M.D.  ASSISTANT:  Elmyra Ricks, PA, present throughout the entire case and necessary for timely completion of procedure.  ANESTHESIA:  General.  BLOOD LOSS:  800 mL.  BLOOD GIVEN:  None.  SPECIMENS:  None.  CULTURES:  None.  COMPLICATIONS:  None.  DRESSING:  Soft compressive.  DESCRIPTION OF PROCEDURE:  The patient was brought to the operating room and after adequate anesthesia had been obtained, placed on the HANA table.  Appropriate padding, support, and positioning.  Prepped and draped in usual sterile fashion.  A longitudinal incision just posterior to the ASIS going distally.  A lot of adipose tissue, very well- developed muscles.  Tensor and fascia incised.  The deep fascia exposed. Very vascular area.  Deep fascia capsule excised.  Hemostasis with cautery.  Femoral neck cut 1 fingerbreadth above the lesser trochanter. Femoral head removed.  Acetabulum exposed, brought up to good bleeding bone.  Well-fitted with a 52 mm cup, hammered in place at 40 degrees of abduction, minimal anteversion.  Confirmed good position on fluoroscopy. This was then fixed to 2 screws through the cup pre-drilled and then 36 mm internal diameter liner.  Using the HANA table,  the femur was exposed, posterior tissue release.  This was then broached and brought to good filling with a #5 stem.  After appropriate trials, #5 stem was seated with a 36 -5 BIOLOX head.  Excellent reduction, equal leg lengths confirmed visually and fluoroscopically.  Wound irrigated, injected with Exparel.  Deep fascia closed with #1 Vicryl.  Subcutaneous and subcuticular closure.  Margins were injected with Marcaine.  Sterile compressive dressing applied.  Taken off on the HANA bed.  Anesthesia reversed.  Brought to the recovery room. Tolerated the surgery well.  No complications.     Ninetta Lights, M.D.     DFM/MEDQ  D:  04/21/2016  T:  04/21/2016  Job:  [id number]

## 2016-04-21 NOTE — Interval H&P Note (Signed)
History and Physical Interval Note:  04/21/2016 8:28 AM  Ashley [name]  has presented today for surgery, with the diagnosis of djd right hip  The various methods of treatment have been discussed with the patient and family. After consideration of risks, benefits and other options for treatment, the patient has consented to  Procedure(s): TOTAL HIP ARTHROPLASTY ANTERIOR APPROACH (Right) as a surgical intervention .  The patient's history has been reviewed, patient examined, no change in status, stable for surgery.  I have reviewed the patient's chart and labs.  Questions were answered to the patient's satisfaction.     Ninetta Lights

## 2016-04-22 ENCOUNTER — Encounter (HOSPITAL_COMMUNITY): Payer: Self-pay | Admitting: General Practice

## 2016-04-22 LAB — CBC
HCT: 31.3 % — ABNORMAL LOW (ref 36.0–46.0)
Hemoglobin: 9.9 g/dL — ABNORMAL LOW (ref 12.0–15.0)
MCH: 26.2 pg (ref 26.0–34.0)
MCHC: 31.6 g/dL (ref 30.0–36.0)
MCV: 82.8 fL (ref 78.0–100.0)
Platelets: 182 10*3/uL (ref 150–400)
RBC: 3.78 MIL/uL — ABNORMAL LOW (ref 3.87–5.11)
RDW: 15.5 % (ref 11.5–15.5)
WBC: 11.3 10*3/uL — ABNORMAL HIGH (ref 4.0–10.5)

## 2016-04-22 LAB — BASIC METABOLIC PANEL
Anion gap: 6 (ref 5–15)
BUN: 9 mg/dL (ref 6–20)
CO2: 26 mmol/L (ref 22–32)
Calcium: 8.3 mg/dL — ABNORMAL LOW (ref 8.9–10.3)
Chloride: 105 mmol/L (ref 101–111)
Creatinine, Ser: 0.75 mg/dL (ref 0.44–1.00)
GFR calc Af Amer: >60 mL/min (ref >60)
GFR calc non Af Amer: >60 mL/min (ref >60)
Glucose, Bld: 132 mg/dL — ABNORMAL HIGH (ref 65–99)
Potassium: 4.5 mmol/L (ref 3.5–5.1)
Sodium: 137 mmol/L (ref 135–145)

## 2016-04-22 NOTE — Progress Notes (Signed)
Physical Therapy Treatment Patient Details Name: Ashley [name] MRN: [id number] DOB: 02/20/1976 Today's Date: 04/22/2016    History of Present Illness 40 y.o. female now s/p rt anterior THA. PMH:  hypertension, bilateral TKA.     PT Comments    Pt progressed to gait training with slow guarded movements.  Pt required increased time to perform AP 1x10 with length rest break after 5 reps.    Follow Up Recommendations  Home health PT;Supervision for mobility/OOB     Equipment Recommendations  Rolling walker with 5" wheels    Recommendations for Other Services       Precautions / Restrictions Precautions Precautions: Anterior Hip;Fall Precaution Booklet Issued: Yes (comment) Precaution Comments: HEP provided, reviewed/posted anterior THA precautions Restrictions Weight Bearing Restrictions: Yes RLE Weight Bearing: Weight bearing as tolerated    Mobility  Bed Mobility Overal bed mobility: Needs Assistance Bed Mobility: Supine to Sit     Supine to sit: Mod assist;HOB elevated     General bed mobility comments: Cues needed for sequence getting out of bed, encouraging use of rail. Required max assist once in sitting to scoot R hip to edge of bed.    Transfers Overall transfer level: Needs assistance Equipment used: Rolling walker (2 wheeled) (wide) Transfers: Sit to/from Stand Sit to Stand: Min assist;+2 physical assistance (Mod +1 from toilet.  )         General transfer comment: Cues for sequencing of L leg and hand placement to and from seated surface.  Bed height elevated to improve ease and build confidence. Pt used grab bar on right in bathroom.    Ambulation/Gait Ambulation/Gait assistance: Min assist;+2 physical assistance;+2 safety/equipment Ambulation Distance (Feet): 15 Feet (x2) Assistive device: Rolling walker (2 wheeled) Gait Pattern/deviations: Step-to pattern;Decreased stance time - right;Trunk flexed;Antalgic   Gait velocity  interpretation: <1.8 ft/sec, indicative of risk for recurrent falls General Gait Details: Cues for sequencing with each step, weight shifting to R and L, upper trunk control and RW safety/positioning (particularly during turns and backing).   Stairs            Wheelchair Mobility    Modified Rankin (Stroke Patients Only)       Balance Overall balance assessment: Needs assistance   Sitting balance-Leahy Scale: Good       Standing balance-Leahy Scale: Fair Standing balance comment: havey reliance on hand grips in RW.                      Cognition Arousal/Alertness: Lethargic;Suspect due to medications Behavior During Therapy: [facility] for tasks assessed/performed Overall Cognitive Status: Within Functional Limits for tasks assessed                      Exercises Total Joint Exercises Ankle Circles/Pumps: AROM;AAROM;Both;10 reps;Supine (required increased assist and unable to perform further exercise.  )    General Comments        Pertinent Vitals/Pain Pain Assessment: 0-10 Pain Score: 10-Worst pain ever Pain Location: R hip and back Pain Descriptors / Indicators: Discomfort;Crying;Moaning;Guarding;Grimacing Pain Intervention(s): Monitored during session;RN gave pain meds during session;Repositioned;Ice applied;Heat applied (ice to hip and heat to back)    Home Living                      Prior Function            PT Goals (current goals can now be found in the care plan section) Acute Rehab PT  Goals Patient Stated Goal: go home Potential to Achieve Goals: Good Progress towards PT goals: Progressing toward goals    Frequency  7X/week    PT Plan Current plan remains appropriate    Co-evaluation PT/OT/SLP Co-Evaluation/Treatment: Yes Reason for Co-Treatment: Complexity of the patient's impairments (multi-system involvement);Necessary to address cognition/behavior during functional activity;For patient/therapist safety PT goals  addressed during session: Mobility/safety with mobility;Strengthening/ROM       End of Session Equipment Utilized During Treatment: Gait belt Activity Tolerance: Patient limited by pain Patient left: in chair;with nursing/sitter in room;with call bell/phone within reach     Time: 0816-0845 PT Time Calculation (min) (ACUTE ONLY): 29 min  Charges:  $Gait Training: 8-22 mins                    G Codes:      Cristela Blue 05/05/16, 8:58 AM  Governor Rooks, PTA pager [phone]

## 2016-04-22 NOTE — Progress Notes (Signed)
Subjective: 1 Day Post-Op Procedure(s) (LRB): TOTAL HIP ARTHROPLASTY ANTERIOR APPROACH (Right) Patient reports pain as severe.  Pain to RLE and to lower back.  C/O lightheadedness/dizziness when going from seated to standing position.  Objective: Vital signs in last 24 hours: Temp:  [97.7 F (36.5 C)-98.7 F (37.1 C)] 97.7 F (36.5 C) (08/03 0456) Pulse Rate:  [77-106] 77 (08/03 0456) Resp:  [11-25] 16 (08/03 0456) BP: (107-150)/(48-89) 116/66 (08/03 0456) SpO2:  [95 %-100 %] 99 % (08/03 0456)  Intake/Output from previous day: 08/02 0701 - 08/03 0700 In: 2935 [I.V.:2885; IV Piggyback:50] Out: 800 [Blood:800] Intake/Output this shift: No intake/output data recorded.   Recent Labs  04/22/16 0440  HGB 9.9*    Recent Labs  04/22/16 0440  WBC 11.3*  RBC 3.78*  HCT 31.3*  PLT 182    Recent Labs  04/22/16 0440  NA 137  K 4.5  CL 105  CO2 26  BUN 9  CREATININE 0.75  GLUCOSE 132*  CALCIUM 8.3*   No results for input(s): LABPT, INR in the last 72 hours.  Neurologically intact Neurovascular intact Sensation intact distally  Moderate tenderness entire RLE  Assessment/Plan: 1 Day Post-Op Procedure(s) (LRB): TOTAL HIP ARTHROPLASTY ANTERIOR APPROACH (Right) Advance diet Up with therapy D/C IV fluids Plan for discharge tomorrow with hhpt WBAT RLE-anterior hip precautions ABLA-mildly symptomatic but stable Dry dressing change prn  Fannie Knee 04/22/2016, 8:46 AM

## 2016-04-22 NOTE — Progress Notes (Signed)
Physical Therapy Treatment Patient Details Name: Ashley [name] MRN: [id number] DOB: 06-06-1976 Today's Date: 04/22/2016    History of Present Illness 40 y.o. female now s/p rt anterior THA. PMH:  hypertension, bilateral TKA.     PT Comments    Pt performed with improved endurance and reports decreased pain.  Pt able to follow commands and perform therapeutic exercise and advance gait distance.  Will continue PT in prep for d/c home tomorrow.    Follow Up Recommendations  Home health PT;Supervision for mobility/OOB     Equipment Recommendations  Rolling walker with 5" wheels    Recommendations for Other Services       Precautions / Restrictions Precautions Precautions: Anterior Hip;Fall Precaution Booklet Issued: Yes (comment) Precaution Comments: HEP provided, reviewed/posted anterior THA precautions Restrictions Weight Bearing Restrictions: Yes RLE Weight Bearing: Weight bearing as tolerated    Mobility  Bed Mobility Overal bed mobility: Needs Assistance Bed Mobility: Sit to Supine       Sit to supine: Min assist   General bed mobility comments: assist for RLE back into bed.    Transfers Overall transfer level: Needs assistance Equipment used: Rolling walker (2 wheeled) Transfers: Sit to/from Stand Sit to Stand: Min guard Stand pivot transfers: Min guard       General transfer comment: Cues for hand placement  Ambulation/Gait Ambulation/Gait assistance: Min guard Ambulation Distance (Feet): 80 Feet Assistive device: Rolling walker (2 wheeled) Gait Pattern/deviations: Step-to pattern;Trunk flexed;Decreased stride length     General Gait Details: Cues for upper trunk control and sequencing.     Stairs            Wheelchair Mobility    Modified Rankin (Stroke Patients Only)       Balance Overall balance assessment: Needs assistance   Sitting balance-Leahy Scale: Good       Standing balance-Leahy Scale: Fair                      Cognition Arousal/Alertness: Awake/alert Behavior During Therapy: WFL for tasks assessed/performed Overall Cognitive Status: Within Functional Limits for tasks assessed                      Exercises      General Comments        Pertinent Vitals/Pain Pain Assessment: 0-10 Pain Score: 5  Pain Location: R hip Pain Descriptors / Indicators: Sore;Guarding Pain Intervention(s): Monitored during session;Repositioned    Home Living Family/patient expects to be discharged to:: Private residence Living Arrangements: Children                  Prior Function            PT Goals (current goals can now be found in the care plan section) Acute Rehab PT Goals Patient Stated Goal: go home Potential to Achieve Goals: Good Progress towards PT goals: Progressing toward goals    Frequency  7X/week    PT Plan Current plan remains appropriate    Co-evaluation             End of Session Equipment Utilized During Treatment: Gait belt Activity Tolerance: Patient limited by pain Patient left: in chair;with nursing/sitter in room;with call bell/phone within reach     Time: 1545-1611 PT Time Calculation (min) (ACUTE ONLY): 26 min  Charges:  $Gait Training: 8-22 mins $Therapeutic Exercise: 8-22 mins  G Codes:      Cristela Blue 04/22/2016, 4:20 PM Governor Rooks, PTA pager [phone]

## 2016-04-22 NOTE — Evaluation (Signed)
Occupational Therapy Evaluation Patient Details Name: Ashley [name] MRN: [id number] DOB: 22-Jan-1976 Today's Date: 04/22/2016    History of Present Illness 40 y.o. female now s/p rt anterior THA. PMH:  hypertension, bilateral TKA.    Clinical Impression   PTA Pt independent in ADL and IADL. Pt with significant pain in hip and back, managed during session. Pt with decreased independence in ADL and functional mobility. Pt would benefit from OT interventions in the acute care setting to address LB bathing/dressing as well as safety with DME and benefits of AE "hip kit". OT to follow acutely     Follow Up Recommendations  Home health OT;Supervision/Assistance - 24 hour    Equipment Recommendations  Other (comment) (reccomended that Pt purchase "hip kit" with AE for ADL)    Recommendations for Other Services       Precautions / Restrictions Precautions Precautions: Anterior Hip;Fall Precaution Booklet Issued: Yes (comment) Precaution Comments: HEP provided, reviewed/posted anterior THA precautions Restrictions Weight Bearing Restrictions: Yes RLE Weight Bearing: Weight bearing as tolerated      Mobility Bed Mobility Overal bed mobility: Needs Assistance Bed Mobility: Supine to Sit     Supine to sit: Mod assist;HOB elevated Sit to supine: +2 for physical assistance;Max assist   General bed mobility comments: Cues needed for sequence getting out of bed, encouraging use of rail. Required max assist once in sitting to scoot R hip to edge of bed.    Transfers Overall transfer level: Needs assistance Equipment used: Rolling walker (2 wheeled) Transfers: Sit to/from [facility] Sit to Stand: Min assist;+2 physical assistance Stand pivot transfers: Mod assist       General transfer comment: Cues for sequencing of L leg and hand placement to and from seated surface.  Bed height elevated to improve ease and build confidence. Pt used grab bar on right  in bathroom.      Balance Overall balance assessment: Needs assistance Sitting-balance support: Single extremity supported Sitting balance-Leahy Scale: Good       Standing balance-Leahy Scale: Fair Standing balance comment: heavy reliance on hand grips on RW                            ADL Overall ADL's : Needs assistance/impaired Eating/Feeding: Set up;Sitting   Grooming: Wash/dry face;Oral care;Applying deodorant;Set up;Sitting       Lower Body Bathing: Maximal assistance;Sitting/lateral leans Lower Body Bathing Details (indicate cue type and reason): Pt educated on AE kit Upper Body Dressing : Set up;Sitting   Lower Body Dressing: Maximal assistance;With adaptive equipment;With caregiver independent assisting   Toilet Transfer: +2 for safety/equipment;Adhering to hip precautions;Cueing for sequencing;Ambulation;BSC;RW Toilet Transfer Details (indicate cue type and reason): Pt ambulated to bathroom with cueing for shifting weight and safe hand placement. Pt grimacing and groaning with each step. Toileting- Clothing Manipulation and Hygiene: Maximal assistance;Sit to/from stand Toileting - Clothing Manipulation Details (indicate cue type and reason): Pt max assist for clothing, and min assist for peri care post urination Tub/ Shower Transfer: +2 for safety/equipment   Functional mobility during ADLs: Maximal assistance       Vision     Perception     Praxis      Pertinent Vitals/Pain Pain Assessment: Faces Pain Score: 10-Worst pain ever Faces Pain Scale: Hurts whole lot Pain Location: R hip and back Pain Descriptors / Indicators: Operative site guarding;Discomfort;Grimacing;Sore;Tightness Pain Intervention(s): Monitored during session;Repositioned;RN gave pain meds during session;Ice applied;Heat applied (ice to  hip and heat to back)     Hand Dominance Right   Extremity/Trunk Assessment Upper Extremity Assessment Upper Extremity Assessment: Overall  WFL for tasks assessed   Lower Extremity Assessment Lower Extremity Assessment: Defer to PT evaluation       Communication Communication Communication: No difficulties   Cognition Arousal/Alertness: Lethargic;Suspect due to medications Behavior During Therapy: [facility] for tasks assessed/performed Overall Cognitive Status: Within Functional Limits for tasks assessed                     General Comments       Exercises       Shoulder Instructions      Home Living Family/patient expects to be discharged to:: Private residence Living Arrangements: Alone Available Help at Discharge: Family;Available 24 hours/day (2 sons will be staying with her for a week) Type of Home: House Home Access: Level entry     Home Layout: One level     Bathroom Shower/Tub: Tub/shower unit Shower/tub characteristics: Architectural technologist: Standard Bathroom Accessibility: Yes How Accessible: Accessible via walker Home Equipment: Bedside commode;Tub bench;Hand held shower head          Prior Functioning/Environment Level of Independence: Independent             OT Diagnosis: Acute pain   OT Problem List: Decreased activity tolerance;Decreased range of motion;Decreased knowledge of use of DME or AE;Pain   OT Treatment/Interventions: Self-care/ADL training;DME and/or AE instruction;Therapeutic activities;Patient/family education    OT Goals(Current goals can be found in the care plan section) Acute Rehab OT Goals Patient Stated Goal: go home OT Goal Formulation: With patient Time For Goal Achievement: 04/29/16 Potential to Achieve Goals: Good ADL Goals Pt Will Perform Lower Body Bathing: with set-up;with adaptive equipment;sit to/from stand Pt Will Perform Lower Body Dressing: with set-up;sit to/from stand;with caregiver independent in assisting Pt Will Transfer to Toilet: with supervision;bedside commode;ambulating Pt Will Perform Toileting - Clothing Manipulation and  hygiene: with supervision;sit to/from stand Pt Will Perform Tub/Shower Transfer: with min assist;tub bench Additional ADL Goal #1: Pt will recall 3/3 precautions and maintain them during ADL with no more than one verbal cue  OT Frequency: Min 3X/week   Barriers to D/C:            Co-evaluation PT/OT/SLP Co-Evaluation/Treatment: Yes Reason for Co-Treatment: Complexity of the patient's impairments (multi-system involvement);For patient/therapist safety;Necessary to address cognition/behavior during functional activity PT goals addressed during session: Mobility/safety with mobility;Strengthening/ROM OT goals addressed during session: ADL's and self-care;Proper use of Adaptive equipment and DME      End of Session Equipment Utilized During Treatment: Gait belt;Rolling walker Nurse Communication: Mobility status  Activity Tolerance: Patient limited by pain Patient left: in chair;with call bell/phone within reach   Time: 0820-0858 OT Time Calculation (min): 38 min Charges:  OT General Charges $OT Visit: 1 Procedure OT Evaluation $OT Eval Low Complexity: 1 Procedure OT Treatments $Self Care/Home Management : 8-22 mins G-Codes:    Merri Ray Hilliard OTR/L 04/22/2016, 11:10 AM [phone]

## 2016-04-22 NOTE — Care Management Note (Signed)
Case Management Note  Patient Details  Name: Ashley [name] MRN: [id number] Date of Birth: 08-08-1976  Subjective/Objective:   40 yr old female s/p right total hip arthroplasty, anterior approach.                 Action/Plan: Case manager spoke with patient concerning [place] and DME needs. Patient was preoperatively setup with [facility], no changes. Patient states her sons will assist her for a few weeks and her husband. She has a 3in1 and shower chair at home, rolling walker has been ordered.    Expected Discharge Date:    04/22/16              Expected Discharge Plan:  [place]  In-House Referral:     Discharge planning Services  CM Consult  Post Acute Care Choice:  Durable Medical Equipment, Home Health Choice offered to:  Patient  DME Arranged:  Walker rolling DME Agency:  [place]:  PT [place]:  [facility] (now [facility])  Status of Service:  Completed, signed off  If discussed at [employer] of Stay Meetings, dates discussed:    Additional Comments:  Ninfa Meeker, RN 04/22/2016, 12:48 PM

## 2016-04-23 ENCOUNTER — Encounter (HOSPITAL_COMMUNITY): Payer: Self-pay | Admitting: Orthopedic Surgery

## 2016-04-23 LAB — BASIC METABOLIC PANEL
Anion gap: 6 (ref 5–15)
BUN: 13 mg/dL (ref 6–20)
CO2: 29 mmol/L (ref 22–32)
Calcium: 8.4 mg/dL — ABNORMAL LOW (ref 8.9–10.3)
Chloride: 103 mmol/L (ref 101–111)
Creatinine, Ser: 0.88 mg/dL (ref 0.44–1.00)
GFR calc Af Amer: >60 mL/min (ref >60)
GFR calc non Af Amer: >60 mL/min (ref >60)
Glucose, Bld: 122 mg/dL — ABNORMAL HIGH (ref 65–99)
Potassium: 3.9 mmol/L (ref 3.5–5.1)
Sodium: 138 mmol/L (ref 135–145)

## 2016-04-23 LAB — CBC
HCT: 29.3 % — ABNORMAL LOW (ref 36.0–46.0)
Hemoglobin: 9.2 g/dL — ABNORMAL LOW (ref 12.0–15.0)
MCH: 26.1 pg (ref 26.0–34.0)
MCHC: 31.4 g/dL (ref 30.0–36.0)
MCV: 83 fL (ref 78.0–100.0)
Platelets: 175 10*3/uL (ref 150–400)
RBC: 3.53 MIL/uL — ABNORMAL LOW (ref 3.87–5.11)
RDW: 15.6 % — ABNORMAL HIGH (ref 11.5–15.5)
WBC: 12.2 10*3/uL — ABNORMAL HIGH (ref 4.0–10.5)

## 2016-04-23 NOTE — Progress Notes (Signed)
Occupational Therapy Treatment Patient Details Name: Ashley [name] MRN: [id number] DOB: [birth year] Today's Date: 04/23/2016    History of present illness 40 y.o. female now s/p rt anterior THA. PMH:  hypertension, bilateral TKA.    OT comments  Pt moving much better today during OT session. Focus of today's session was education and practice with AE kit to increased independence in ADL. Pt able to demonstrate competency with all tools in kits to aid in LB dressing/bathing and toileting. OT discussed shower transfer with tub bench, and Pt reassured that someone would be there to help assist for safety. OT frequency and discharge updated due to increased demonstrated ability during session. Pt able to recall all precautions and maintain them during session.    Follow Up Recommendations  No OT follow up;Supervision/Assistance - 24 hour    Equipment Recommendations  Other (comment) (Pt has purchased AE hip kit.)    Recommendations for Other Services      Precautions / Restrictions Precautions Precautions: Anterior Hip;Fall Precaution Booklet Issued: Yes (comment) Precaution Comments: HEP provided, reviewed/posted anterior THA precautions Restrictions Weight Bearing Restrictions: Yes RLE Weight Bearing: Weight bearing as tolerated       Mobility Bed Mobility Overal bed mobility: Needs Assistance Bed Mobility: Sit to Supine       Sit to supine: Min guard (bed flat, no rails. Bed raised to mimick home environment)   General bed mobility comments: Pt required increased time, and moves slow. Able to maintain precautions  Transfers Overall transfer level: Needs assistance Equipment used: Rolling walker (2 wheeled) Transfers: Sit to/from Stand Sit to Stand: Min guard         General transfer comment: Pt needed increased time for transfers    Balance Overall balance assessment: Needs assistance Sitting-balance support: No upper extremity supported Sitting  balance-Leahy Scale: Good     Standing balance support: Bilateral upper extremity supported Standing balance-Leahy Scale: Fair Standing balance comment: heavy reliance on RW for support                   ADL Overall ADL's : Needs assistance/impaired             Lower Body Bathing: Min guard;With adaptive equipment;Sit to/from stand Lower Body Bathing Details (indicate cue type and reason): Pt educated and practiced with AE kit     Lower Body Dressing: Minimal assistance;With adaptive equipment;Sit to/from stand Lower Body Dressing Details (indicate cue type and reason): Pt educated and practiced with grabber, sock donner, shoe horn from AE kit Toilet Transfer: Min guard;Adhering to hip precautions;Ambulation;BSC;RW   Toileting- Clothing Manipulation and Hygiene: Minimal assistance;With adaptive equipment;Adhering to hip precautions;Sit to/from stand Toileting - Clothing Manipulation Details (indicate cue type and reason): Pt educated on bathroom tongs for peri care and AE tools for managing clothing with toileting Tub/ Shower Transfer: Minimal assistance;Tub bench;Rolling walker   Functional mobility during ADLs: Min guard;Rolling walker General ADL Comments: Pt moving much better today for ADLs      Vision                     Perception     Praxis      Cognition   Behavior During Therapy: [facility] for tasks assessed/performed Overall Cognitive Status: Within Functional Limits for tasks assessed                       Extremity/Trunk Assessment  Exercises     Shoulder Instructions       General Comments      Pertinent Vitals/ Pain       Pain Assessment: 0-10 Pain Score: 7  Faces Pain Scale: Hurts whole lot Pain Location: R hip and thigh Pain Descriptors / Indicators: Tightness;Operative site guarding Pain Intervention(s): Monitored during session;RN gave pain meds during session;Repositioned  Home Living                                           Prior Functioning/Environment              Frequency Min 2X/week     Progress Toward Goals  OT Goals(current goals can now be found in the care plan section)  Progress towards OT goals: Progressing toward goals  Acute Rehab OT Goals Patient Stated Goal: go home OT Goal Formulation: With patient Time For Goal Achievement: 04/29/16 Potential to Achieve Goals: Good ADL Goals Pt Will Perform Lower Body Bathing: with set-up;with adaptive equipment;sit to/from stand Pt Will Perform Lower Body Dressing: with set-up;with adaptive equipment;with caregiver independent in assisting;sit to/from stand Pt Will Transfer to Toilet: with supervision;bedside commode;ambulating Pt Will Perform Toileting - Clothing Manipulation and hygiene: with supervision;sit to/from stand Pt Will Perform Tub/Shower Transfer: with min assist;tub bench Additional ADL Goal #1: Pt will recall 3/3 precautions and maintain them during ADL with no more than one verbal cue  Plan Frequency needs to be updated;Discharge plan needs to be updated    Co-evaluation                 End of Session Equipment Utilized During Treatment: Rolling walker   Activity Tolerance Patient tolerated treatment well   Patient Left in bed;with call bell/phone within reach   Nurse Communication Mobility status        Time: 2706-2376 OT Time Calculation (min): 35 min  Charges: OT Treatments $Self Care/Home Management : 23-37 mins  Merri Ray Ashley [name] OTR/L 04/23/2016, 9:13 AM [phone]

## 2016-04-23 NOTE — Progress Notes (Signed)
Physical Therapy Treatment Patient Details Name: Ashley [name] MRN: [id number] DOB: 11-28-1975 Today's Date: 04/23/2016    History of Present Illness 40 y.o. female now s/p rt anterior THA. PMH:  hypertension, bilateral TKA.     PT Comments    Pt performed exercise and reviewed HEP in prep for d/c home.  Pt performed curb training for safe entry into home.  Pt c/o mild dizziness that progressed as tx progressed.  Pt returned to supine position and BP obtained 114/63 in R UE.  87 bpm.    Follow Up Recommendations  Home health PT;Supervision for mobility/OOB     Equipment Recommendations  Rolling walker with 5" wheels (bariatric.  )    Recommendations for Other Services       Precautions / Restrictions Precautions Precautions: Anterior Hip;Fall Precaution Booklet Issued: Yes (comment) Precaution Comments: HEP provided, reviewed/posted anterior THA precautions Restrictions Weight Bearing Restrictions: Yes RLE Weight Bearing: Weight bearing as tolerated    Mobility  Bed Mobility Overal bed mobility: Needs Assistance Bed Mobility: Sit to Supine;Supine to Sit     Supine to sit: Supervision Sit to supine: Min guard (due to dizziness.  )   General bed mobility comments: Pt remains to require increased time and self assists RLE.    Transfers Overall transfer level: Needs assistance Equipment used: Rolling walker (2 wheeled) (wide) Transfers: Sit to/from Stand Sit to Stand: Min guard Stand pivot transfers: Min guard       General transfer comment: Pt needed increased time for transfers  Ambulation/Gait Ambulation/Gait assistance: Min guard Ambulation Distance (Feet): 80 Feet Assistive device: Rolling walker (2 wheeled) Gait Pattern/deviations: Step-to pattern;Decreased stride length;Trunk flexed   Gait velocity interpretation: <1.8 ft/sec, indicative of risk for recurrent falls General Gait Details: Pt required cues for upper trunk control and cues for RW  safety.  Pt resting L elbow on RW and educated this is not safe.  Pt with c/o dizziness and closing eyes during gait training.     Stairs Stairs: Yes Stairs assistance: Min assist Stair Management: No rails;With walker;Step to pattern;Backwards;Forwards Number of Stairs: 1 (curb step) General stair comments: Cues for sequencing and RW placement.  Pt performed backwards to ascend and forwards to descend.    Wheelchair Mobility    Modified Rankin (Stroke Patients Only)       Balance Overall balance assessment: Needs assistance Sitting-balance support: No upper extremity supported Sitting balance-Leahy Scale: Good     Standing balance support: Bilateral upper extremity supported Standing balance-Leahy Scale: Fair Standing balance comment: heavy reliance on RW for support                    Cognition Arousal/Alertness: Awake/alert Behavior During Therapy: WFL for tasks assessed/performed Overall Cognitive Status: Within Functional Limits for tasks assessed                      Exercises Total Joint Exercises Ankle Circles/Pumps: AROM;Both;10 reps;Supine Quad Sets: AROM;Right;10 reps;Supine Short Arc Quad: AROM;Right;10 reps;Supine Heel Slides: AAROM;Right;10 reps;Supine Long Arc Quad: AROM;Right;10 reps;Seated Knee Flexion: AROM;Right;10 reps;Standing Marching in Standing: AROM;Right;10 reps;Standing Other Exercises Other Exercises: quad stretch in supine x2 for 15 sec.      General Comments        Pertinent Vitals/Pain Pain Assessment: Faces Pain Score: 7  Faces Pain Scale: Hurts even more Pain Location: R hip and quad Pain Descriptors / Indicators: Burning Pain Intervention(s): Monitored during session;Repositioned    Home Living  Prior Function            PT Goals (current goals can now be found in the care plan section) Acute Rehab PT Goals Patient Stated Goal: go home Potential to Achieve Goals:  Good Progress towards PT goals: Progressing toward goals    Frequency  7X/week    PT Plan Current plan remains appropriate    Co-evaluation             End of Session Equipment Utilized During Treatment: Gait belt Activity Tolerance: Patient limited by pain Patient left: with nursing/sitter in room;with call bell/phone within reach;in bed     Time: [id number] PT Time Calculation (min) (ACUTE ONLY): 28 min  Charges:  $Gait Training: 8-22 mins $Therapeutic Exercise: 8-22 mins                    G Codes:      Cristela Blue 05/15/2016, 10:30 AM Governor Rooks, PTA pager [phone]

## 2016-04-23 NOTE — Progress Notes (Signed)
Patient discharged to home, discharge instructions given, patient states they understand, prescriptions given

## 2016-04-23 NOTE — Progress Notes (Signed)
Subjective: 2 Days Post-Op Procedure(s) (LRB): TOTAL HIP ARTHROPLASTY ANTERIOR APPROACH (Right) Patient reports pain as moderate.  Patient is doing much better with pain today.  Some lightheadedness and nausea, but no vomiting.    Objective: Vital signs in last 24 hours: Temp:  [98.1 F (36.7 C)-98.2 F (36.8 C)] 98.2 F (36.8 C) (08/04 0615) Pulse Rate:  [64-87] 64 (08/04 0615) Resp:  [16] 16 (08/03 1300) BP: (115-136)/(59-82) 115/59 (08/04 0615) SpO2:  [96 %-99 %] 96 % (08/04 0615)  Intake/Output from previous day: 08/03 0701 - 08/04 0700 In: 240 [P.O.:240] Out: -  Intake/Output this shift: No intake/output data recorded.   Recent Labs  04/22/16 0440 04/23/16 0323  HGB 9.9* 9.2*    Recent Labs  04/22/16 0440 04/23/16 0323  WBC 11.3* 12.2*  RBC 3.78* 3.53*  HCT 31.3* 29.3*  PLT 182 175    Recent Labs  04/22/16 0440 04/23/16 0323  NA 137 138  K 4.5 3.9  CL 105 103  CO2 26 29  BUN 9 13  CREATININE 0.75 0.88  GLUCOSE 132* 122*  CALCIUM 8.3* 8.4*   No results for input(s): LABPT, INR in the last 72 hours.  Neurologically intact Neurovascular intact Sensation intact distally Intact pulses distally Dorsiflexion/Plantar flexion intact Incision: scant drainage No cellulitis present Compartment soft   Assessment/Plan: 2 Days Post-Op Procedure(s) (LRB): TOTAL HIP ARTHROPLASTY ANTERIOR APPROACH (Right) Advance diet Up with therapy Discharge home with home health today or tomorrow depending on progression with PT WBAT RLE-anterior hip precautions ABLA-stable Dry dressing change prn  Fannie Knee 04/23/2016, 8:24 AM

## 2016-04-23 NOTE — Progress Notes (Signed)
Physical Therapy Treatment Patient Details Name: Ashley [name] MRN: [id number] DOB: Sep 08, 1976 Today's Date: 04/23/2016    History of Present Illness 40 y.o. female now s/p rt anterior THA. PMH:  hypertension, bilateral TKA.     PT Comments    Pt performed increased gait with no c/o dizziness.  Pt required cues for sequencing and RW safety.  Plans to d/c home today with assist from family.    Follow Up Recommendations  Home health PT;Supervision for mobility/OOB     Equipment Recommendations  Rolling walker with 5" wheels (bariatric.  )    Recommendations for Other Services       Precautions / Restrictions Precautions Precautions: Anterior Hip;Fall Precaution Booklet Issued: Yes (comment) Precaution Comments: HEP provided, reviewed/posted anterior THA precautions Restrictions Weight Bearing Restrictions: Yes RLE Weight Bearing: Weight bearing as tolerated    Mobility  Bed Mobility Overal bed mobility: Needs Assistance Bed Mobility: Sit to Supine;Supine to Sit     Supine to sit: Supervision Sit to supine: Supervision   General bed mobility comments: Pt remains to require increased time and self assists RLE.    Transfers Overall transfer level: Needs assistance Equipment used: Rolling walker (2 wheeled) (wide) Transfers: Sit to/from Stand Sit to Stand: Supervision Stand pivot transfers: Supervision       General transfer comment: Pt needed increased time for transfers, cues for forward advancement of RLE.    Ambulation/Gait Ambulation/Gait assistance: Min guard Ambulation Distance (Feet): 120 Feet Assistive device: Rolling walker (2 wheeled) Gait Pattern/deviations: Step-to pattern;Decreased stride length;Trunk flexed   Gait velocity interpretation: Below normal speed for age/gender General Gait Details: Pt required cues for upper trunk control and cues for RW safety.  No c/o dizziness with improved endurance.     Stairs             Wheelchair Mobility    Modified Rankin (Stroke Patients Only)       Balance     Sitting balance-Leahy Scale: Good       Standing balance-Leahy Scale: Fair                      Cognition Arousal/Alertness: Awake/alert Behavior During Therapy: WFL for tasks assessed/performed Overall Cognitive Status: Within Functional Limits for tasks assessed                      Exercises Total Joint Exercises Ankle Circles/Pumps: AROM;Both;10 reps;Supine Quad Sets: AROM;Right;10 reps;Supine Short Arc Quad: AROM;Right;10 reps;Supine Heel Slides: AAROM;Right;10 reps;Supine    General Comments        Pertinent Vitals/Pain Pain Assessment: Faces Faces Pain Scale: Hurts even more Pain Location: R quad burning Pain Descriptors / Indicators: Burning Pain Intervention(s): Monitored during session;Repositioned;Patient requesting pain meds-RN notified;Ice applied    Home Living                      Prior Function            PT Goals (current goals can now be found in the care plan section) Acute Rehab PT Goals Patient Stated Goal: go home Potential to Achieve Goals: Good Progress towards PT goals: Progressing toward goals    Frequency  7X/week    PT Plan Current plan remains appropriate    Co-evaluation             End of Session Equipment Utilized During Treatment: Gait belt Activity Tolerance: Patient limited by pain Patient left: with nursing/sitter in room;with  call bell/phone within reach;in bed     Time: 1427-1450 PT Time Calculation (min) (ACUTE ONLY): 23 min  Charges:  $Gait Training: 8-22 mins $Therapeutic Exercise: 8-22 mins                    G Codes:      Ashley [name] 05/11/2016, 2:57 PM Ashley [name], PTA pager [phone]

## 2016-04-24 ENCOUNTER — Encounter: Payer: Self-pay | Admitting: Orthopedic Surgery

## 2016-05-19 DIAGNOSIS — M5126 Other intervertebral disc displacement, lumbar region: Secondary | ICD-10-CM | POA: Insufficient documentation

## 2016-07-13 ENCOUNTER — Encounter: Payer: 59 | Attending: Obstetrics and Gynecology | Admitting: Dietician

## 2016-07-13 ENCOUNTER — Encounter: Payer: Self-pay | Admitting: Dietician

## 2016-07-13 DIAGNOSIS — Z713 Dietary counseling and surveillance: Secondary | ICD-10-CM | POA: Insufficient documentation

## 2016-07-13 DIAGNOSIS — E669 Obesity, unspecified: Secondary | ICD-10-CM | POA: Insufficient documentation

## 2016-07-13 DIAGNOSIS — Z6839 Body mass index (BMI) 39.0-39.9, adult: Secondary | ICD-10-CM | POA: Insufficient documentation

## 2016-07-13 NOTE — Progress Notes (Signed)
Medical Nutrition Therapy:  Appt start time: [id number] end time:  1615.   Assessment:  Primary concerns today: Patient is here today alone.  She was referred for obesity and would like to lose weight and get off of more medications and avoid diabetes.  Her youngest sister has type 1 diabetes.  Her father and sister both died last year.  Her last A1C was 6.1% 04/01/15.  She has had recent steroids for inflammation related to degenerative disk disease and degenerative joint disease.  Hx also includes PCOS.  She had a right hip replacement 04/21/16.    Weight hx: Highest adult weight 303 lbs prior to the hip replacement surgery,  Lowest adult weight 220's Today's weight 291 lbs.  Patient lives with her 40 yo daughter. Her daughter goes to day care. She was eating when she was depressed and did not drink water but since surgery has increased water and has not been over eating.  She is depressed and has started to see a counselor at the [facility], Emilee Hero.  She drives but has not shopped much.  She is preparing quick meals often.  Eats out occasionally.  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  She does not know about her finances in the future which causes increased stress as well.  Preferred Learning Style:   No preference indicated   Learning Readiness:   Ready  Change in progress   MEDICATIONS: see list to include Belvique   DIETARY INTAKE:  Usual eating pattern includes 2-3 meals and 1-3 snacks per day. Avoided foods include added salt, juice  24-hr recall:  B ( AM): instant oatmeal or cream of rice OR cheerios with 2% milk OR 2 boiled eggs and 1-2 pieces [place] sausage OR egg white omelet with [place] omelet OR smoothie (greens, berries, banana, other fruit) OR skips OR fruit cup Snk ( AM): raw veges or fruit  L ( PM): [employer] salad and 1/2 sandwich OR smoothie Snk ( PM): occasional olive snack packs or tuna in foil pouch  D  ( PM): Oodles and noodles (only uses part of a seasoning pack),  Snk ( PM): occasional frozen fruit, carrots, fruit cups Beverages: water, rare soda  Usual physical activity: Physical Therapy currently for hip replacement  Estimated energy needs: 2000 calories 225 g carbohydrates 150 g protein 56 g fat  Progress Towards Goal(s):  In progress.   Nutritional Diagnosis:  NB-1.1 Food and nutrition-related knowledge deficit As related to weight managment and healthy eating.  As evidenced by patient report and diet hx.    Intervention:  Nutrition counseling/education related to healthy eating to decrease inflammation and lose weight. Discussed not skipping meals, importance of increased non starchy vegetable intake and lean protein.    Choose healthy foods, eat slowly, stop when you are satisfied. Avoid skipping meals  [email or web address] Google Healthy Crockpot recipes Aim for half of your plate to be non starchy vegetables. Small amounts of lean protein  Continue the great changes that you have made:  Drinking more water  Avoiding sugar sweetened beverages  Avoiding Juice Consider Tumeric (Curcumin), Sources of Omega 3 (Salmon, ground flax seeds, chia seeds).   Teaching Method Utilized:  Visual Auditory Hands on  Handouts given during visit include:  My plate, snack list, and snack ideas for children  [employer] list  Barriers to learning/adherence to lifestyle change: pain, finances  Demonstrated degree of understanding via:  Teach Back   Monitoring/Evaluation:  Dietary intake, exercise, and body weight prn.

## 2016-07-13 NOTE — Patient Instructions (Signed)
Choose healthy foods, eat slowly, stop when you are satisfied. Avoid skipping meals  [email or web address] [employer] Healthy Crockpot recipes Aim for half of your plate to be non starchy vegetables. Small amounts of lean protein  Continue the great changes that you have made:  Drinking more water  Avoiding sugar sweetened beverages  Avoiding Juice Consider Tumeric (Curcumin), Sources of Omega 3 (Salmon, ground flax seeds, chia seeds).

## 2016-07-28 ENCOUNTER — Other Ambulatory Visit: Payer: Self-pay | Admitting: Urgent Care

## 2016-07-28 DIAGNOSIS — J302 Other seasonal allergic rhinitis: Secondary | ICD-10-CM

## 2016-11-02 ENCOUNTER — Other Ambulatory Visit: Payer: Self-pay | Admitting: Neurological Surgery

## 2016-11-02 DIAGNOSIS — M5126 Other intervertebral disc displacement, lumbar region: Secondary | ICD-10-CM

## 2016-11-10 ENCOUNTER — Ambulatory Visit
Admission: RE | Admit: 2016-11-10 | Discharge: 2016-11-10 | Disposition: A | Discharge status: Home / Self Care | Payer: BLUE CROSS/BLUE SHIELD | Source: Ambulatory Visit | Attending: Neurological Surgery | Admitting: Neurological Surgery

## 2016-11-10 DIAGNOSIS — M5126 Other intervertebral disc displacement, lumbar region: Secondary | ICD-10-CM

## 2016-11-10 MED ORDER — GADOBENATE DIMEGLUMINE 529 MG/ML IV SOLN
20.0000 mL | Freq: Once | INTRAVENOUS | Status: AC | PRN
  Administered 2016-11-10: 20 mL via INTRAVENOUS

## 2017-03-29 ENCOUNTER — Ambulatory Visit (HOSPITAL_COMMUNITY)
Admission: EM | Admit: 2017-03-29 | Discharge: 2017-03-29 | Disposition: A | Discharge status: Home / Self Care | Payer: BLUE CROSS/BLUE SHIELD | Attending: Family Medicine | Admitting: Family Medicine

## 2017-03-29 ENCOUNTER — Encounter (HOSPITAL_COMMUNITY): Payer: Self-pay | Admitting: *Deleted

## 2017-03-29 DIAGNOSIS — M25562 Pain in left knee: Secondary | ICD-10-CM | POA: Diagnosis not present

## 2017-03-29 MED ORDER — IBUPROFEN 800 MG PO TABS: 800.0000 mg | ORAL_TABLET | Freq: Three times a day (TID) | ORAL | 0 refills | Status: DC

## 2017-03-29 NOTE — Discharge Instructions (Signed)
Recommend rest, ice, compression of the joint by wearing a knee sleeve or an Ace bandages, I have prescribed high dose antiinflammatories, take one tablet every 8 hours, follow up with orthopedics if pain persists.

## 2017-03-29 NOTE — ED Triage Notes (Signed)
Twisted  l  Knee   Last  Night            Pt  Reports         Pain on   Certain   Movements    And        Has    A   History  Of  Artificial  Knee  Replacements  In  Past
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
## 2017-04-04 ENCOUNTER — Ambulatory Visit (HOSPITAL_COMMUNITY)
Admission: EM | Admit: 2017-04-04 | Discharge: 2017-04-04 | Disposition: A | Discharge status: Home / Self Care | Payer: BLUE CROSS/BLUE SHIELD | Attending: Family Medicine | Admitting: Family Medicine

## 2017-04-04 ENCOUNTER — Encounter (HOSPITAL_COMMUNITY): Payer: Self-pay | Admitting: Nurse Practitioner

## 2017-04-04 DIAGNOSIS — M25562 Pain in left knee: Secondary | ICD-10-CM | POA: Diagnosis not present

## 2017-04-04 DIAGNOSIS — S83412A Sprain of medial collateral ligament of left knee, initial encounter: Secondary | ICD-10-CM
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
## 2017-04-04 NOTE — Discharge Instructions (Signed)
Use the knee brace while standing or walking. Take pain medicine that you have as well as ibuprofen as directed. Take with food. Call the orthopedist that performed the knee surgery and make an appointment for follow-up. Apply ice periodically.

## 2017-06-24 ENCOUNTER — Other Ambulatory Visit (HOSPITAL_COMMUNITY): Payer: Self-pay | Admitting: Neurological Surgery

## 2017-06-24 ENCOUNTER — Other Ambulatory Visit: Payer: Self-pay | Admitting: Neurological Surgery

## 2017-06-24 DIAGNOSIS — M5126 Other intervertebral disc displacement, lumbar region: Secondary | ICD-10-CM

## 2017-08-04 ENCOUNTER — Ambulatory Visit (HOSPITAL_COMMUNITY)
Admission: RE | Admit: 2017-08-04 | Discharge: 2017-08-04 | Disposition: A | Discharge status: Home / Self Care | Payer: BLUE CROSS/BLUE SHIELD | Source: Ambulatory Visit | Attending: Neurological Surgery | Admitting: Neurological Surgery

## 2017-08-04 DIAGNOSIS — M5116 Intervertebral disc disorders with radiculopathy, lumbar region: Secondary | ICD-10-CM | POA: Insufficient documentation

## 2017-08-04 DIAGNOSIS — M5126 Other intervertebral disc displacement, lumbar region: Secondary | ICD-10-CM

## 2017-08-04 LAB — PREGNANCY, URINE: Preg Test, Ur: NEGATIVE

## 2017-08-04 MED ORDER — ONDANSETRON HCL 4 MG/2ML IJ SOLN: 4.0000 mg | Freq: Four times a day (QID) | INTRAMUSCULAR | Status: DC | PRN

## 2017-08-04 MED ORDER — IOPAMIDOL (ISOVUE-M 200) INJECTION 41%
INTRAMUSCULAR | Status: AC
  Administered 2017-08-04: 10 mL via INTRATHECAL
  Filled 2017-08-04: qty 10

## 2017-08-04 MED ORDER — OXYCODONE-ACETAMINOPHEN 5-325 MG PO TABS
1.0000 | ORAL_TABLET | ORAL | Status: DC | PRN
  Administered 2017-08-04: 2 via ORAL

## 2017-08-04 MED ORDER — DIAZEPAM 5 MG PO TABS
10.0000 mg | ORAL_TABLET | Freq: Once | ORAL | Status: AC
  Administered 2017-08-04: 10 mg via ORAL

## 2017-08-04 MED ORDER — LIDOCAINE HCL (PF) 1 % IJ SOLN
INTRAMUSCULAR | Status: AC
  Administered 2017-08-04: 2 mL via INTRADERMAL
  Filled 2017-08-04: qty 5

## 2017-08-04 MED ORDER — DEXAMETHASONE 4 MG PO TABS
4.0000 mg | ORAL_TABLET | Freq: Once | ORAL | Status: AC
  Administered 2017-08-04: 4 mg via ORAL
  Filled 2017-08-04: qty 1

## 2017-08-04 MED ORDER — LIDOCAINE HCL (PF) 1 % IJ SOLN
5.0000 mL | Freq: Once | INTRAMUSCULAR | Status: AC
Start: 2017-08-04 — End: 2017-08-04
  Administered 2017-08-04: 2 mL via INTRADERMAL

## 2017-08-04 MED ORDER — IOPAMIDOL (ISOVUE-M 200) INJECTION 41%
20.0000 mL | Freq: Once | INTRAMUSCULAR | Status: AC
  Administered 2017-08-04: 10 mL via INTRATHECAL

## 2017-08-04 MED ORDER — OXYCODONE-ACETAMINOPHEN 5-325 MG PO TABS
ORAL_TABLET | ORAL | Status: AC
  Administered 2017-08-04: 2 via ORAL
  Filled 2017-08-04: qty 2

## 2017-08-04 MED ORDER — DIAZEPAM 5 MG PO TABS
ORAL_TABLET | ORAL | Status: AC
  Administered 2017-08-04: 10 mg via ORAL
  Filled 2017-08-04: qty 2

## 2017-08-04 NOTE — Procedures (Signed)
Ashley [name] is a 41 year old individual who's had significant problems with back pain and leg pain. She's had a previous herniated nucleus pulposus at L4-5. She's developed more right-sided leg symptoms and has evidence of multilevel degeneration including L3-4 L4-5 and L5-S1. A myelogram is now being performed to assess the problems at each level in preparation for surgery.  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 Further evaluation with CT scanning.

## 2017-08-04 NOTE — Discharge Instructions (Signed)
Myelogram, Care After Refer to this sheet in the next few weeks. These instructions provide you with information about caring for yourself after your procedure. Your health care provider may also give you more specific instructions. Your treatment has been planned according to current medical practices, but problems sometimes occur. Call your health care provider if you have any problems or questions after your procedure. What can I expect after the procedure? After the procedure, it is common to have:  Soreness at your injection site.  A mild headache.  Follow these instructions at home:  Drink enough fluid to keep your urine clear or pale yellow. This will help flush out the dye (contrast material) from your spine.  Rest as told by your health care provider. Lie flat with your head slightly raised (elevated) to reduce the risk of headache.  Do not bend, lift, or do any strenuous activity for 24-48 hours or as told by your health care provider.  Take over-the-counter and prescription medicines only as told by your health care provider.  Take care of and remove your bandage (dressing) as told by your health care provider.  Bathe or shower as told by your health care provider. Contact a health care provider if:  You have a fever.  You have a headache that lasts longer than 24 hours.  You feel nauseous or vomit.  You have a stiff neck or numbness in your legs.  You are unable to urinate or have a bowel movement.  You develop a rash, itching, or sneezing. Get help right away if:  You have new symptoms or your symptoms get worse.  You have a seizure.  You have trouble breathing. This information is not intended to replace advice given to you by your health care provider. Make sure you discuss any questions you have with your health care provider. Document Released: 10/03/2015 Document Revised: 02/12/2016 Document Reviewed: 06/19/2015 Elsevier Interactive Patient Education   2018 [place] and Lumbar Puncture Discharge Instructions  1. Go home and rest quietly for the next 24 hours.  It is important to lie flat for the next 24 hours.  Get up only to go to the restroom.  You may lie in the bed or on a couch on your back, your stomach, your left side or your right side.  You may have one pillow under your head.  You may have pillows between your knees while you are on your side or under your knees while you are on your back.  2. DO NOT drive today.  Recline the seat as far back as it will go, while still wearing your seat belt, on the way home.  3. You may get up to go to the bathroom as needed.  You may sit up for 10 minutes to eat.  You may resume your normal diet and medications unless otherwise indicated.  4. The incidence of headache, nausea, or vomiting is about 5% (one in 20 patients).  If you develop a headache, lie flat and drink plenty of fluids until the headache goes away.  Caffeinated beverages may be helpful.  If you develop severe nausea and vomiting or a headache that does not go away with flat bed rest, call healthcare provider.  5. You may resume normal activities after your 24 hours of bed rest is over; however, do not exert yourself strongly or do any heavy lifting tomorrow.

## 2017-08-10 DIAGNOSIS — M5416 Radiculopathy, lumbar region: Secondary | ICD-10-CM | POA: Diagnosis present

## 2017-08-10 DIAGNOSIS — M47816 Spondylosis without myelopathy or radiculopathy, lumbar region: Secondary | ICD-10-CM | POA: Insufficient documentation

## 2017-09-09 ENCOUNTER — Telehealth: Payer: Self-pay | Admitting: Vascular Surgery

## 2017-09-09 NOTE — Telephone Encounter (Signed)
Office appointment needed for the following patients.  Received: 2 days ago  Message Contents  Willy Eddy, RN  P [facility] Admin Pool        Please schedule office appointments and call the following patients with Dr. Donnetta Hutching for ALIF.    Malaney Mcbean [phone]/ [birth year]/ surgery date is 10/21/2017.  Thank you, Jacqlyn Larsen

## 2017-09-09 NOTE — Telephone Encounter (Signed)
TFE approved 10/18/17 4:15 appt. Spoke to pt.

## 2017-09-14 ENCOUNTER — Other Ambulatory Visit: Payer: Self-pay | Admitting: *Deleted

## 2017-10-18 ENCOUNTER — Ambulatory Visit (INDEPENDENT_AMBULATORY_CARE_PROVIDER_SITE_OTHER): Payer: BLUE CROSS/BLUE SHIELD | Admitting: Vascular Surgery

## 2017-10-18 ENCOUNTER — Encounter: Payer: Self-pay | Admitting: Vascular Surgery

## 2017-10-18 VITALS — BP 147/94 | HR 86 | Temp 98.8°F | Resp 16 | Ht 71.0 in | Wt 258.0 lb

## 2017-10-18 DIAGNOSIS — M5137 Other intervertebral disc degeneration, lumbosacral region: Secondary | ICD-10-CM | POA: Diagnosis not present

## 2017-10-18 NOTE — Progress Notes (Signed)
[facility]  Patient name: Ashley [name] MRN: [id number] DOB: 11-06-1975 Sex: female  REASON FOR CONSULT: Discuss anterior exposure for L5-S1 fusion  HPI: Ashley [name] is a 42 y.o. female, who is here today for discussion of L5-S1 fusion.  She has a history of severe degenerative disc disease in her back.  She has been seen by Dr. Kristeen Miss who is recommended multilevel back surgery with L5-S1 being an anterior approach.  She is here today for discussion in my role in this.  She has had prior discectomy but no prior fusion.  Does have and also a history of total right hip arthroplasty and bilateral total knee Arnell Sieving.  She continues to have discomfort in her back and mainly down her right leg.  She has had no prior intra-abdominal surgeries.  He has no history of cardiac or pulmonary disease.  Past Medical History:  Diagnosis Date  . Arthritis    knees, back  . 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 Complication of anesthesia   . History of pneumonia   . Hypertension   . PCOS (polycystic ovarian syndrome)   . PONV (postoperative nausea and vomiting)     Family History  Problem Relation Age of Onset  . Cancer Maternal Grandmother        UTERINE  . Cancer Paternal Grandmother        breast  . Diabetes Father   . Cancer Father        COLON  . Heart disease Father   . Kidney disease Father        dialysis  . Diabetes Sister     SOCIAL HISTORY: Social History   Socioeconomic History  . Marital status: Legally Separated    Spouse name: Not on file  . Number of children: Not on file  . Years of education: Not on file  . Highest education level: Not on file  Social Needs  . Financial resource strain: Not on file  . Food insecurity - worry: Not on file  . Food insecurity - inability: Not on file  . Transportation needs - medical: Not on file  . Transportation needs - non-medical: Not on file    Occupational History  . Not on file  Tobacco Use  . Smoking status: Never Smoker  . Smokeless tobacco: Never Used  Substance and Sexual Activity  . Alcohol use: No    Alcohol/week: 0.0 oz  . Drug use: No  . Sexual activity: Not on file  Other Topics Concern  . Not on file  Social History Narrative  . Not on file    Allergies  Allergen Reactions  . Gadolinium Derivatives Nausea And Vomiting    Pt describes this happens every time she gets gado even with slow injection  . Codeine Itching and Nausea Only  . Cymbalta [Duloxetine Hcl] Other (See Comments)    Altered mental status    Current Outpatient Medications  Medication Sig Dispense Refill  . baclofen (LIORESAL) 10 MG tablet Take 10 mg 3 (three) times daily by mouth.    Marland Kitchen buPROPion (WELLBUTRIN XL) 300 MG 24 hr tablet Take 300 mg daily by mouth.    . cetirizine (ZYRTEC) 10 MG tablet Take 10 mg by mouth daily.    . furosemide (LASIX) 20 MG tablet Take 20 mg by mouth daily as needed for fluid.    Marland Kitchen gabapentin (NEURONTIN) 300 MG capsule Take 300 mg 3 (three) times  daily by mouth.    . Lorcaserin HCl (BELVIQ) 10 MG TABS Take 1 tablet daily by mouth.     . losartan-hydrochlorothiazide (HYZAAR) 50-12.5 MG tablet Take 1 tablet by mouth daily. 90 tablet 3  . Multiple Vitamins-Minerals (MULTIVITAMIN WITH MINERALS) tablet Take 1 tablet daily by mouth.    . nabumetone (RELAFEN) 500 MG tablet Take 500 mg 2 (two) times daily by mouth.    . oxyCODONE-acetaminophen (PERCOCET) 10-325 MG tablet Take 1 tablet every 6 (six) hours as needed by mouth for pain.     No current facility-administered medications for this visit.     REVIEW OF SYSTEMS:  [X]  denotes positive finding, [ ]  denotes negative finding Cardiac  Comments:  Chest pain or chest pressure:    Shortness of breath upon exertion:    Short of breath when lying flat:    Irregular heart rhythm:        Vascular    Pain in calf, thigh, or hip brought on by ambulation: x   neurogenic  Pain in feet at night that wakes you up from your sleep:     Blood clot in your veins:    Leg swelling:  x       Pulmonary    Oxygen at home:    Productive cough:     Wheezing:         Neurologic    Sudden weakness in arms or legs:     Sudden numbness in arms or legs:     Sudden onset of difficulty speaking or slurred speech:    Temporary loss of vision in one eye:     Problems with dizziness:         Gastrointestinal    Blood in stool:     Vomited blood:         Genitourinary    Burning when urinating:     Blood in urine:        Psychiatric    Major depression:         Hematologic    Bleeding problems:    Problems with blood clotting too easily:        Skin    Rashes or ulcers:        Constitutional    Fever or chills:      PHYSICAL EXAM: Vitals:   10/18/17 1622  BP: (!) 147/94  Pulse: 86  Resp: 16  Temp: 98.8 F (37.1 C)  TempSrc: Oral  SpO2: 98%  Weight: 258 lb (117 kg)  Height: 5\' 11"  (1.803 m)    GENERAL: The patient is a well-nourished female, in no acute distress. The vital signs are documented above. CARDIOVASCULAR: Palpable radial and palpable dorsalis pedis pulses bilaterally.  Carotid arteries without bruits bilaterally PULMONARY: There is good air exchange  ABDOMEN: Soft and non-tender  MUSCULOSKELETAL: There are no major deformities or cyanosis. NEUROLOGIC: No focal weakness or paresthesias are detected. SKIN: There are no ulcers or rashes noted. PSYCHIATRIC: The patient has a normal affect.  DATA:  Reviewed a prior CT scan of her lumbar spine showing no calcification in her aortoiliac segment  MEDICAL ISSUES: Long discussion with the patient regarding anterior exposure.  I explained that Dr. Ellene Route feels that she can have symptom relief with disc replacement.  Explained my role for mobilization for anterior fusion.  I explained the technical aspects of mobilization of the rectus muscle, intraperitoneal contents, left ureter,  arterial and venous structures overlying the spine.  Hospital explain the  potential injury to all these including venous injury with mobilization.  I do not feel she is a prohibitive risk.  She does have moderate obesity but no prior intra-abdominal surgery and no prior intra-abdominal surgery.  She reports that her surgery date is not been finalized but we will be available for anterior approach   Rosetta Posner, MD [facility] Tel [phone] Pager [phone]

## 2017-10-19 ENCOUNTER — Other Ambulatory Visit: Payer: Self-pay | Admitting: *Deleted

## 2017-10-19 ENCOUNTER — Other Ambulatory Visit: Payer: Self-pay | Admitting: Neurological Surgery

## 2017-10-21 ENCOUNTER — Inpatient Hospital Stay: Admit: 2017-10-21 | Payer: BLUE CROSS/BLUE SHIELD | Admitting: Neurological Surgery

## 2017-10-21 ENCOUNTER — Other Ambulatory Visit (HOSPITAL_COMMUNITY): Payer: Self-pay | Admitting: Neurological Surgery

## 2017-10-21 DIAGNOSIS — M5126 Other intervertebral disc displacement, lumbar region: Secondary | ICD-10-CM

## 2017-10-21 SURGERY — ANTERIOR LUMBAR FUSION 1 LEVEL
Anesthesia: General

## 2017-11-28 NOTE — Pre-Procedure Instructions (Signed)
Ashley [name]  11/28/2017      [id number] [address], [place] [address] Phone: [phone] Fax: [phone]    Your procedure is scheduled on December 09, 2017  Report to [facility] Admitting at 530 AM.  Call this number if you have problems the morning of surgery:  [phone]   Remember:  Do not eat food or drink liquids after midnight.  Take these medicines the morning of surgery with A SIP OF WATER alprazolam (xanax)-if needed Baclofen (lioresal)-if needed Bupropion (wellbutrin) cetirizine (zyrtec) Gabapentin (neurontin) Nabumetone (relafen) Methocarbamol (robaxin) Sertraline (zoloft)  7 days prior to surgery STOP taking any Aspirin (unless otherwise instructed by your surgeon), Aleve, Naproxen, Ibuprofen, Motrin, Advil, Goody's, BC's, all herbal medications, fish oil, and all vitamins  Continue all other medications as instructed by your physician except follow the above medication instructions before surgery   Do not wear jewelry, make-up or nail polish.  Do not wear lotions, powders, or perfumes, or deodorant.  Do not shave 48 hours prior to surgery.    Do not bring valuables to the hospital.  [facility] is not responsible for any belongings or valuables.  Contacts, dentures or bridgework may not be worn into surgery.  Leave your suitcase in the car.  After surgery it may be brought to your room.  For patients admitted to the hospital, discharge time will be determined by your treatment team.  Patients discharged the day of surgery will not be allowed to drive home.   Special instructions:  - Preparing For Surgery  Before surgery, you can play an important role. Because skin is not sterile, your skin needs to be as free of germs as possible. You can reduce the number of germs on your skin by washing with CHG (chlorahexidine gluconate) Soap before surgery.  CHG is an  antiseptic cleaner which kills germs and bonds with the skin to continue killing germs even after washing.  Please do not use if you have an allergy to CHG or antibacterial soaps. If your skin becomes reddened/irritated stop using the CHG.  Do not shave (including legs and underarms) for at least 48 hours prior to first CHG shower. It is OK to shave your face.  Please follow these instructions carefully.   1. Shower the NIGHT BEFORE SURGERY and the MORNING OF SURGERY with CHG.   2. If you chose to wash your hair, wash your hair first as usual with your normal shampoo.  3. After you shampoo, rinse your hair and body thoroughly to remove the shampoo.  4. Use CHG as you would any other liquid soap. You can apply CHG directly to the skin and wash gently with a scrungie or a clean washcloth.   5. Apply the CHG Soap to your body ONLY FROM THE NECK DOWN.  Do not use on open wounds or open sores. Avoid contact with your eyes, ears, mouth and genitals (private parts). Wash Face and genitals (private parts)  with your normal soap.  6. Wash thoroughly, paying special attention to the area where your surgery will be performed.  7. Thoroughly rinse your body with warm water from the neck down.  8. DO NOT shower/wash with your normal soap after using and rinsing off the CHG Soap.  9. Pat yourself dry with a CLEAN TOWEL.  10. Wear CLEAN PAJAMAS to bed the night before surgery, wear comfortable clothes the morning of surgery  11. Place CLEAN SHEETS on your bed the night of your first shower and DO NOT SLEEP WITH PETS.  Day of Surgery: Do not apply any deodorants/lotions. Please wear clean clothes to the hospital/surgery center.    Please read over the following fact sheets that you were given. Pain Booklet, Coughing and Deep Breathing, MRSA Information and Surgical Site Infection Prevention

## 2017-11-29 ENCOUNTER — Encounter (HOSPITAL_COMMUNITY): Payer: Self-pay

## 2017-11-29 ENCOUNTER — Other Ambulatory Visit: Payer: Self-pay

## 2017-11-29 ENCOUNTER — Encounter (HOSPITAL_COMMUNITY)
Admission: RE | Admit: 2017-11-29 | Discharge: 2017-11-29 | Disposition: A | Discharge status: Home / Self Care | Payer: BLUE CROSS/BLUE SHIELD | Source: Ambulatory Visit | Attending: Neurological Surgery | Admitting: Neurological Surgery

## 2017-11-29 DIAGNOSIS — M5126 Other intervertebral disc displacement, lumbar region: Secondary | ICD-10-CM | POA: Diagnosis not present

## 2017-11-29 DIAGNOSIS — M419 Scoliosis, unspecified: Secondary | ICD-10-CM | POA: Diagnosis not present

## 2017-11-29 DIAGNOSIS — M48061 Spinal stenosis, lumbar region without neurogenic claudication: Secondary | ICD-10-CM | POA: Diagnosis not present

## 2017-11-29 DIAGNOSIS — M5127 Other intervertebral disc displacement, lumbosacral region: Secondary | ICD-10-CM | POA: Diagnosis not present

## 2017-11-29 DIAGNOSIS — M4807 Spinal stenosis, lumbosacral region: Secondary | ICD-10-CM | POA: Diagnosis not present

## 2017-11-29 HISTORY — DX: Pneumonia, unspecified organism: J18.9

## 2017-11-29 LAB — BASIC METABOLIC PANEL
Anion gap: 10 (ref 5–15)
BUN: 10 mg/dL (ref 6–20)
CO2: 23 mmol/L (ref 22–32)
Calcium: 8.8 mg/dL — ABNORMAL LOW (ref 8.9–10.3)
Chloride: 103 mmol/L (ref 101–111)
Creatinine, Ser: 0.96 mg/dL (ref 0.44–1.00)
GFR calc Af Amer: >60 mL/min (ref >60)
GFR calc non Af Amer: >60 mL/min (ref >60)
Glucose, Bld: 90 mg/dL (ref 65–99)
Potassium: 4 mmol/L (ref 3.5–5.1)
Sodium: 136 mmol/L (ref 135–145)

## 2017-11-29 LAB — CBC
HCT: 38.4 % (ref 36.0–46.0)
Hemoglobin: 12.3 g/dL (ref 12.0–15.0)
MCH: 25.9 pg — ABNORMAL LOW (ref 26.0–34.0)
MCHC: 32 g/dL (ref 30.0–36.0)
MCV: 80.8 fL (ref 78.0–100.0)
Platelets: 245 10*3/uL (ref 150–400)
RBC: 4.75 MIL/uL (ref 3.87–5.11)
RDW: 14.8 % (ref 11.5–15.5)
WBC: 7.7 10*3/uL (ref 4.0–10.5)

## 2017-11-29 LAB — SURGICAL PCR SCREEN
MRSA, PCR: NEGATIVE
Staphylococcus aureus: POSITIVE — AB

## 2017-11-29 LAB — TYPE AND SCREEN
ABO/RH(D): B POS
Antibody Screen: NEGATIVE

## 2017-11-30 ENCOUNTER — Ambulatory Visit (HOSPITAL_COMMUNITY)
Admission: RE | Admit: 2017-11-30 | Discharge: 2017-11-30 | Disposition: A | Discharge status: Home / Self Care | Payer: BLUE CROSS/BLUE SHIELD | Source: Ambulatory Visit | Attending: Neurological Surgery | Admitting: Neurological Surgery

## 2017-11-30 DIAGNOSIS — M4807 Spinal stenosis, lumbosacral region: Secondary | ICD-10-CM | POA: Insufficient documentation

## 2017-11-30 DIAGNOSIS — M419 Scoliosis, unspecified: Secondary | ICD-10-CM | POA: Insufficient documentation

## 2017-11-30 DIAGNOSIS — M5126 Other intervertebral disc displacement, lumbar region: Secondary | ICD-10-CM | POA: Insufficient documentation

## 2017-11-30 DIAGNOSIS — M48061 Spinal stenosis, lumbar region without neurogenic claudication: Secondary | ICD-10-CM | POA: Insufficient documentation

## 2017-11-30 DIAGNOSIS — M5127 Other intervertebral disc displacement, lumbosacral region: Secondary | ICD-10-CM | POA: Insufficient documentation

## 2017-12-15 ENCOUNTER — Other Ambulatory Visit: Payer: Self-pay | Admitting: *Deleted

## 2018-01-05 DIAGNOSIS — M25559 Pain in unspecified hip: Secondary | ICD-10-CM | POA: Insufficient documentation

## 2018-01-25 ENCOUNTER — Other Ambulatory Visit (HOSPITAL_COMMUNITY): Payer: BLUE CROSS/BLUE SHIELD

## 2018-02-07 NOTE — Pre-Procedure Instructions (Signed)
Ashley [name]  02/07/2018      [id number] [address], [place] [address] Phone: [phone] Fax: [phone]    Your procedure is scheduled on Fri., Jan 20, 2018 from 7:30AM-4:20PM  Report to [facility] Admitting Entrance "A" at 5:30AM  Call this number if you have problems the morning of surgery:  [phone]   Remember:  No food after midnight on May 30th  Take these medicines the morning of surgery with A SIP OF WATER: BuPROPion (WELLBUTRIN XL), Cetirizine (ZYRTEC), Gabapentin (NEURONTIN), and  Sertraline (ZOLOFT). If needed ALPRAZolam Duanne Moron), Baclofen (LIORESAL) for spasms, Docusate sodium (COLACE),  OxyCODONE-acetaminophen (PERCOCET) for pain, and  EPINEPHrine Pen for an allergic reaction (Bring with you the day of surgery).  As of today, Stop taking Lorcaserin HCl ER (BELVIQ XR)  7 days before surgery (5/24), stop taking all Other Aspirin Products, Vitamins, Fish oils, and Herbal medications. Also stop all NSAIDS i.e. Advil, Ibuprofen, Motrin, Aleve, Anaprox, Naproxen, BC and Goody Powders. Including: Nabumetone (RELAFEN)    Do not wear jewelry, make-up or nail polish.  Do not wear lotions, powders, or perfumes, or deodorant.  Do not shave 48 hours prior to surgery.    Do not bring valuables to the hospital.  [facility] is not responsible for any belongings or valuables.  Contacts, dentures or bridgework may not be worn into surgery.  Leave your suitcase in the car.  After surgery it may be brought to your room.  For patients admitted to the hospital, discharge time will be determined by your treatment team.  Patients discharged the day of surgery will not be allowed to drive home.   Special instructions:   [place]- Preparing For Surgery  Before surgery, you can play an important role. Because skin is not sterile, your skin needs to be as free of germs as possible. You can reduce the  number of germs on your skin by washing with CHG (chlorahexidine gluconate) Soap before surgery.  CHG is an antiseptic cleaner which kills germs and bonds with the skin to continue killing germs even after washing.    Oral Hygiene is also important to reduce your risk of infection.  Remember - BRUSH YOUR TEETH THE MORNING OF SURGERY WITH YOUR REGULAR TOOTHPASTE  Please do not use if you have an allergy to CHG or antibacterial soaps. If your skin becomes reddened/irritated stop using the CHG.  Do not shave (including legs and underarms) for at least 48 hours prior to first CHG shower. It is OK to shave your face.  Please follow these instructions carefully.   1. Shower the NIGHT BEFORE SURGERY and the MORNING OF SURGERY with CHG.   2. If you chose to wash your hair, wash your hair first as usual with your normal shampoo.  3. After you shampoo, rinse your hair and body thoroughly to remove the shampoo.  4. Use CHG as you would any other liquid soap. You can apply CHG directly to the skin and wash gently with a scrungie or a clean washcloth.   5. Apply the CHG Soap to your body ONLY FROM THE NECK DOWN.  Do not use on open wounds or open sores. Avoid contact with your eyes, ears, mouth and genitals (private parts). Wash Face and genitals (private parts)  with your normal soap.  6. Wash thoroughly, paying special attention to the area where your surgery will be performed.  7. Thoroughly  rinse your body with warm water from the neck down.  8. DO NOT shower/wash with your normal soap after using and rinsing off the CHG Soap.  9. Pat yourself dry with a CLEAN TOWEL.  10. Wear CLEAN PAJAMAS to bed the night before surgery, wear comfortable clothes the morning of surgery  11. Place CLEAN SHEETS on your bed the night of your first shower and DO NOT SLEEP WITH PETS.  Day of Surgery:  Do not apply any deodorants/lotions.  Please wear clean clothes to the hospital/surgery center.   Remember to  brush your teeth WITH YOUR REGULAR TOOTHPASTE.  Please read over the following fact sheets that you were given. Pain Booklet, Coughing and Deep Breathing, MRSA Information and Surgical Site Infection Prevention

## 2018-02-08 ENCOUNTER — Encounter (HOSPITAL_COMMUNITY): Payer: Self-pay

## 2018-02-08 ENCOUNTER — Encounter (HOSPITAL_COMMUNITY)
Admission: RE | Admit: 2018-02-08 | Discharge: 2018-02-08 | Disposition: A | Discharge status: Home / Self Care | Payer: BLUE CROSS/BLUE SHIELD | Source: Ambulatory Visit | Attending: Neurological Surgery | Admitting: Neurological Surgery

## 2018-02-08 ENCOUNTER — Other Ambulatory Visit: Payer: Self-pay

## 2018-02-08 DIAGNOSIS — Z01812 Encounter for preprocedural laboratory examination: Secondary | ICD-10-CM | POA: Insufficient documentation

## 2018-02-08 HISTORY — DX: Spondylosis without myelopathy or radiculopathy, lumbar region: M47.816

## 2018-02-08 LAB — CBC
HCT: 36.6 % (ref 36.0–46.0)
Hemoglobin: 11.4 g/dL — ABNORMAL LOW (ref 12.0–15.0)
MCH: 25.2 pg — ABNORMAL LOW (ref 26.0–34.0)
MCHC: 31.1 g/dL (ref 30.0–36.0)
MCV: 80.8 fL (ref 78.0–100.0)
Platelets: 216 10*3/uL (ref 150–400)
RBC: 4.53 MIL/uL (ref 3.87–5.11)
RDW: 15.7 % — ABNORMAL HIGH (ref 11.5–15.5)
WBC: 6.4 10*3/uL (ref 4.0–10.5)

## 2018-02-08 LAB — BASIC METABOLIC PANEL
Anion gap: 5 (ref 5–15)
BUN: 9 mg/dL (ref 6–20)
CO2: 30 mmol/L (ref 22–32)
Calcium: 8.9 mg/dL (ref 8.9–10.3)
Chloride: 105 mmol/L (ref 101–111)
Creatinine, Ser: 0.88 mg/dL (ref 0.44–1.00)
GFR calc Af Amer: >60 mL/min (ref >60)
GFR calc non Af Amer: >60 mL/min (ref >60)
Glucose, Bld: 84 mg/dL (ref 65–99)
Potassium: 4.1 mmol/L (ref 3.5–5.1)
Sodium: 140 mmol/L (ref 135–145)

## 2018-02-08 LAB — TYPE AND SCREEN
ABO/RH(D): B POS
Antibody Screen: NEGATIVE

## 2018-02-08 LAB — SURGICAL PCR SCREEN
MRSA, PCR: NEGATIVE
Staphylococcus aureus: NEGATIVE

## 2018-02-08 NOTE — Progress Notes (Signed)
PCP - Dr. Dorathy Daft  Cardiologist - Denies  Chest x-ray - Denies  EKG - 11/29/17 (E)  Stress Test - Denies  ECHO - Denies  Cardiac Cath - Denies  Sleep Study - Denies CPAP - None  LABS- 02/08/18: CBC, BMP, T/S POC UPreg: 02/08/18  ASA- Denies  Anesthesia- No  Pt denies having chest pain, sob, or fever at this time. All instructions explained to the pt, with a verbal understanding of the material. Pt agrees to go over the instructions while at home for a better understanding. The opportunity to ask questions was provided.

## 2018-02-09 ENCOUNTER — Other Ambulatory Visit: Payer: Self-pay | Admitting: *Deleted

## 2018-02-16 ENCOUNTER — Encounter (HOSPITAL_COMMUNITY): Payer: Self-pay | Admitting: Certified Registered Nurse Anesthetist

## 2018-02-16 MED ORDER — CEFAZOLIN SODIUM 10 G IJ SOLR
3.0000 g | INTRAMUSCULAR | Status: AC
  Administered 2018-02-17 (×3): 3 g via INTRAVENOUS
  Filled 2018-02-16: qty 3

## 2018-02-17 ENCOUNTER — Inpatient Hospital Stay (HOSPITAL_COMMUNITY): Payer: BLUE CROSS/BLUE SHIELD

## 2018-02-17 ENCOUNTER — Inpatient Hospital Stay (HOSPITAL_COMMUNITY)
Admission: RE | Admit: 2018-02-17 | Discharge: 2018-02-21 | DRG: 460 | Disposition: A | Discharge status: Rehabilitation Facility | Payer: BLUE CROSS/BLUE SHIELD | Source: Ambulatory Visit | Attending: Neurological Surgery | Admitting: Neurological Surgery

## 2018-02-17 ENCOUNTER — Inpatient Hospital Stay (HOSPITAL_COMMUNITY)
Admission: RE | Disposition: A | Discharge status: Rehabilitation Facility | Payer: Self-pay | Source: Ambulatory Visit | Attending: Neurological Surgery

## 2018-02-17 ENCOUNTER — Other Ambulatory Visit (HOSPITAL_COMMUNITY): Payer: Self-pay | Admitting: Radiology

## 2018-02-17 ENCOUNTER — Inpatient Hospital Stay (HOSPITAL_COMMUNITY): Payer: BLUE CROSS/BLUE SHIELD | Admitting: Certified Registered Nurse Anesthetist

## 2018-02-17 DIAGNOSIS — D62 Acute posthemorrhagic anemia: Secondary | ICD-10-CM | POA: Diagnosis not present

## 2018-02-17 DIAGNOSIS — M5416 Radiculopathy, lumbar region: Secondary | ICD-10-CM | POA: Diagnosis not present

## 2018-02-17 DIAGNOSIS — R0989 Other specified symptoms and signs involving the circulatory and respiratory systems: Secondary | ICD-10-CM | POA: Diagnosis not present

## 2018-02-17 DIAGNOSIS — Z6836 Body mass index (BMI) 36.0-36.9, adult: Secondary | ICD-10-CM

## 2018-02-17 DIAGNOSIS — M159 Polyosteoarthritis, unspecified: Secondary | ICD-10-CM | POA: Diagnosis present

## 2018-02-17 DIAGNOSIS — Z800 Family history of malignant neoplasm of digestive organs: Secondary | ICD-10-CM

## 2018-02-17 DIAGNOSIS — Z6841 Body Mass Index (BMI) 40.0 and over, adult: Secondary | ICD-10-CM

## 2018-02-17 DIAGNOSIS — Z8249 Family history of ischemic heart disease and other diseases of the circulatory system: Secondary | ICD-10-CM

## 2018-02-17 DIAGNOSIS — E282 Polycystic ovarian syndrome: Secondary | ICD-10-CM | POA: Diagnosis present

## 2018-02-17 DIAGNOSIS — I10 Essential (primary) hypertension: Secondary | ICD-10-CM | POA: Diagnosis present

## 2018-02-17 DIAGNOSIS — M7989 Other specified soft tissue disorders: Secondary | ICD-10-CM | POA: Diagnosis not present

## 2018-02-17 DIAGNOSIS — M4726 Other spondylosis with radiculopathy, lumbar region: Secondary | ICD-10-CM | POA: Diagnosis present

## 2018-02-17 DIAGNOSIS — M792 Neuralgia and neuritis, unspecified: Secondary | ICD-10-CM | POA: Diagnosis not present

## 2018-02-17 DIAGNOSIS — K5900 Constipation, unspecified: Secondary | ICD-10-CM | POA: Diagnosis not present

## 2018-02-17 DIAGNOSIS — Z885 Allergy status to narcotic agent status: Secondary | ICD-10-CM

## 2018-02-17 DIAGNOSIS — Z96653 Presence of artificial knee joint, bilateral: Secondary | ICD-10-CM | POA: Diagnosis present

## 2018-02-17 DIAGNOSIS — F411 Generalized anxiety disorder: Secondary | ICD-10-CM | POA: Diagnosis not present

## 2018-02-17 DIAGNOSIS — F32A Depression, unspecified: Secondary | ICD-10-CM

## 2018-02-17 DIAGNOSIS — Z808 Family history of malignant neoplasm of other organs or systems: Secondary | ICD-10-CM

## 2018-02-17 DIAGNOSIS — Z4789 Encounter for other orthopedic aftercare: Secondary | ICD-10-CM | POA: Diagnosis not present

## 2018-02-17 DIAGNOSIS — W19XXXA Unspecified fall, initial encounter: Secondary | ICD-10-CM | POA: Diagnosis not present

## 2018-02-17 DIAGNOSIS — M549 Dorsalgia, unspecified: Secondary | ICD-10-CM | POA: Diagnosis present

## 2018-02-17 DIAGNOSIS — M4807 Spinal stenosis, lumbosacral region: Secondary | ICD-10-CM | POA: Diagnosis present

## 2018-02-17 DIAGNOSIS — F329 Major depressive disorder, single episode, unspecified: Secondary | ICD-10-CM | POA: Diagnosis present

## 2018-02-17 DIAGNOSIS — M48061 Spinal stenosis, lumbar region without neurogenic claudication: Secondary | ICD-10-CM | POA: Diagnosis present

## 2018-02-17 DIAGNOSIS — M5117 Intervertebral disc disorders with radiculopathy, lumbosacral region: Secondary | ICD-10-CM | POA: Diagnosis present

## 2018-02-17 DIAGNOSIS — Z803 Family history of malignant neoplasm of breast: Secondary | ICD-10-CM

## 2018-02-17 DIAGNOSIS — I82532 Chronic embolism and thrombosis of left popliteal vein: Secondary | ICD-10-CM | POA: Diagnosis not present

## 2018-02-17 DIAGNOSIS — Z79899 Other long term (current) drug therapy: Secondary | ICD-10-CM

## 2018-02-17 DIAGNOSIS — R339 Retention of urine, unspecified: Secondary | ICD-10-CM | POA: Diagnosis not present

## 2018-02-17 DIAGNOSIS — Z9889 Other specified postprocedural states: Secondary | ICD-10-CM

## 2018-02-17 DIAGNOSIS — Z888 Allergy status to other drugs, medicaments and biological substances status: Secondary | ICD-10-CM | POA: Diagnosis not present

## 2018-02-17 DIAGNOSIS — G8918 Other acute postprocedural pain: Secondary | ICD-10-CM | POA: Diagnosis not present

## 2018-02-17 DIAGNOSIS — Z96641 Presence of right artificial hip joint: Secondary | ICD-10-CM | POA: Diagnosis present

## 2018-02-17 DIAGNOSIS — M4727 Other spondylosis with radiculopathy, lumbosacral region: Secondary | ICD-10-CM | POA: Diagnosis present

## 2018-02-17 DIAGNOSIS — Z8701 Personal history of pneumonia (recurrent): Secondary | ICD-10-CM | POA: Diagnosis not present

## 2018-02-17 DIAGNOSIS — R52 Pain, unspecified: Secondary | ICD-10-CM | POA: Diagnosis not present

## 2018-02-17 DIAGNOSIS — E669 Obesity, unspecified: Secondary | ICD-10-CM

## 2018-02-17 DIAGNOSIS — G9619 Other disorders of meninges, not elsewhere classified: Secondary | ICD-10-CM | POA: Diagnosis present

## 2018-02-17 DIAGNOSIS — K5903 Drug induced constipation: Secondary | ICD-10-CM | POA: Diagnosis not present

## 2018-02-17 DIAGNOSIS — Z419 Encounter for procedure for purposes other than remedying health state, unspecified: Secondary | ICD-10-CM

## 2018-02-17 DIAGNOSIS — T402X5A Adverse effect of other opioids, initial encounter: Secondary | ICD-10-CM | POA: Diagnosis not present

## 2018-02-17 HISTORY — PX: APPLICATION OF ROBOTIC ASSISTANCE FOR SPINAL PROCEDURE: SHX6753

## 2018-02-17 HISTORY — PX: ANTERIOR LAT LUMBAR FUSION: SHX1168

## 2018-02-17 HISTORY — PX: LUMBAR PERCUTANEOUS PEDICLE SCREW 3 LEVEL: SHX5562

## 2018-02-17 HISTORY — PX: ABDOMINAL EXPOSURE: SHX5708

## 2018-02-17 HISTORY — PX: ANTERIOR LUMBAR FUSION: SHX1170

## 2018-02-17 LAB — POCT I-STAT 7, (LYTES, BLD GAS, ICA,H+H)
Acid-Base Excess: 2 mmol/L (ref 0.0–2.0)
Bicarbonate: 27.6 mmol/L (ref 20.0–28.0)
Calcium, Ion: 1.15 mmol/L (ref 1.15–1.40)
HCT: 32 % — ABNORMAL LOW (ref 36.0–46.0)
Hemoglobin: 10.9 g/dL — ABNORMAL LOW (ref 12.0–15.0)
O2 Saturation: 100 %
Patient temperature: 34.9
Potassium: 3.9 mmol/L (ref 3.5–5.1)
Sodium: 138 mmol/L (ref 135–145)
TCO2: 29 mmol/L (ref 22–32)
pCO2 arterial: 44.8 mmHg (ref 32.0–48.0)
pH, Arterial: 7.388 (ref 7.350–7.450)
pO2, Arterial: 223 mmHg — ABNORMAL HIGH (ref 83.0–108.0)

## 2018-02-17 SURGERY — ANTERIOR LUMBAR FUSION 1 LEVEL
Anesthesia: General | Site: Flank

## 2018-02-17 MED ORDER — DOCUSATE SODIUM 100 MG PO CAPS: 100.0000 mg | ORAL_CAPSULE | Freq: Two times a day (BID) | ORAL | Status: DC | PRN

## 2018-02-17 MED ORDER — PHENOL 1.4 % MT LIQD: 1.0000 | OROMUCOSAL | Status: DC | PRN

## 2018-02-17 MED ORDER — PROPOFOL 10 MG/ML IV BOLUS
INTRAVENOUS | Status: AC
  Filled 2018-02-17: qty 20

## 2018-02-17 MED ORDER — ONDANSETRON HCL 4 MG/2ML IJ SOLN
INTRAMUSCULAR | Status: DC | PRN
  Administered 2018-02-17 (×2): 4 mg via INTRAVENOUS

## 2018-02-17 MED ORDER — OXYCODONE HCL 5 MG/5ML PO SOLN: 5.0000 mg | Freq: Once | ORAL | Status: AC | PRN

## 2018-02-17 MED ORDER — LIDOCAINE-EPINEPHRINE 1 %-1:100000 IJ SOLN
INTRAMUSCULAR | Status: AC
  Filled 2018-02-17: qty 1

## 2018-02-17 MED ORDER — MIDAZOLAM HCL 2 MG/2ML IJ SOLN
INTRAMUSCULAR | Status: AC
  Filled 2018-02-17: qty 2

## 2018-02-17 MED ORDER — SODIUM CHLORIDE 0.9% FLUSH
3.0000 mL | Freq: Two times a day (BID) | INTRAVENOUS | Status: DC
  Administered 2018-02-17 – 2018-02-21 (×7): 3 mL via INTRAVENOUS

## 2018-02-17 MED ORDER — LACTATED RINGERS IV SOLN: INTRAVENOUS | Status: DC

## 2018-02-17 MED ORDER — GABAPENTIN 300 MG PO CAPS
600.0000 mg | ORAL_CAPSULE | Freq: Four times a day (QID) | ORAL | Status: DC
  Administered 2018-02-17 – 2018-02-21 (×14): 600 mg via ORAL
  Filled 2018-02-17 (×14): qty 2

## 2018-02-17 MED ORDER — ACETAMINOPHEN 325 MG PO TABS: 650.0000 mg | ORAL_TABLET | ORAL | Status: DC | PRN

## 2018-02-17 MED ORDER — OXYCODONE-ACETAMINOPHEN 5-325 MG PO TABS
1.0000 | ORAL_TABLET | ORAL | Status: DC | PRN
  Administered 2018-02-18 – 2018-02-20 (×4): 1 via ORAL
  Filled 2018-02-17 (×4): qty 1

## 2018-02-17 MED ORDER — SODIUM CHLORIDE 0.9 % IV SOLN
INTRAVENOUS | Status: DC | PRN
  Administered 2018-02-17 (×2)

## 2018-02-17 MED ORDER — LIDOCAINE-EPINEPHRINE 1 %-1:100000 IJ SOLN
INTRAMUSCULAR | Status: DC | PRN
  Administered 2018-02-17 (×2): 10 mL

## 2018-02-17 MED ORDER — SCOPOLAMINE 1 MG/3DAYS TD PT72
MEDICATED_PATCH | TRANSDERMAL | Status: DC | PRN
  Administered 2018-02-17: 1 via TRANSDERMAL

## 2018-02-17 MED ORDER — LABETALOL HCL 5 MG/ML IV SOLN
INTRAVENOUS | Status: AC
  Filled 2018-02-17: qty 4

## 2018-02-17 MED ORDER — OXYCODONE HCL 5 MG PO TABS
5.0000 mg | ORAL_TABLET | ORAL | Status: DC | PRN
  Administered 2018-02-18 – 2018-02-20 (×4): 5 mg via ORAL
  Filled 2018-02-17 (×4): qty 1

## 2018-02-17 MED ORDER — BUPROPION HCL ER (XL) 150 MG PO TB24
300.0000 mg | ORAL_TABLET | Freq: Every day | ORAL | Status: DC
  Administered 2018-02-17 – 2018-02-21 (×5): 300 mg via ORAL
  Filled 2018-02-17 (×6): qty 2

## 2018-02-17 MED ORDER — FLEET ENEMA 7-19 GM/118ML RE ENEM: 1.0000 | ENEMA | Freq: Once | RECTAL | Status: DC | PRN

## 2018-02-17 MED ORDER — SODIUM CHLORIDE 0.9 % IV SOLN: 250.0000 mL | INTRAVENOUS | Status: DC

## 2018-02-17 MED ORDER — METHOCARBAMOL 500 MG PO TABS
ORAL_TABLET | ORAL | Status: AC
  Filled 2018-02-17: qty 1

## 2018-02-17 MED ORDER — METHOCARBAMOL 500 MG PO TABS
500.0000 mg | ORAL_TABLET | Freq: Four times a day (QID) | ORAL | Status: DC | PRN
  Filled 2018-02-17: qty 1

## 2018-02-17 MED ORDER — ACETAMINOPHEN 650 MG RE SUPP: 650.0000 mg | RECTAL | Status: DC | PRN

## 2018-02-17 MED ORDER — ALPRAZOLAM 0.5 MG PO TABS: 1.0000 mg | ORAL_TABLET | Freq: Every day | ORAL | Status: DC | PRN

## 2018-02-17 MED ORDER — CEFAZOLIN SODIUM-DEXTROSE 2-4 GM/100ML-% IV SOLN
2.0000 g | Freq: Three times a day (TID) | INTRAVENOUS | Status: AC
  Administered 2018-02-17 – 2018-02-18 (×2): 2 g via INTRAVENOUS
  Filled 2018-02-17 (×4): qty 100

## 2018-02-17 MED ORDER — LACTATED RINGERS IV SOLN
INTRAVENOUS | Status: DC | PRN
  Administered 2018-02-17 (×3): via INTRAVENOUS

## 2018-02-17 MED ORDER — ONDANSETRON HCL 4 MG/2ML IJ SOLN
INTRAMUSCULAR | Status: AC
  Filled 2018-02-17: qty 4

## 2018-02-17 MED ORDER — PROPOFOL 500 MG/50ML IV EMUL
INTRAVENOUS | Status: DC | PRN
  Administered 2018-02-17: 50 ug/kg/min via INTRAVENOUS

## 2018-02-17 MED ORDER — DEXAMETHASONE SODIUM PHOSPHATE 10 MG/ML IJ SOLN
INTRAMUSCULAR | Status: AC
  Filled 2018-02-17: qty 1

## 2018-02-17 MED ORDER — BUPIVACAINE HCL (PF) 0.5 % IJ SOLN
INTRAMUSCULAR | Status: DC | PRN
  Administered 2018-02-17 (×2): 10 mL

## 2018-02-17 MED ORDER — CHLORHEXIDINE GLUCONATE CLOTH 2 % EX PADS: 6.0000 | MEDICATED_PAD | Freq: Once | CUTANEOUS | Status: DC

## 2018-02-17 MED ORDER — MENTHOL 3 MG MT LOZG: 1.0000 | LOZENGE | OROMUCOSAL | Status: DC | PRN

## 2018-02-17 MED ORDER — POLYETHYLENE GLYCOL 3350 17 G PO PACK: 17.0000 g | PACK | Freq: Every day | ORAL | Status: DC | PRN

## 2018-02-17 MED ORDER — CHLORHEXIDINE GLUCONATE 4 % EX LIQD: 60.0000 mL | Freq: Once | CUTANEOUS | Status: DC

## 2018-02-17 MED ORDER — HYDROMORPHONE HCL 1 MG/ML IJ SOLN
INTRAMUSCULAR | Status: AC
  Filled 2018-02-17: qty 0.5

## 2018-02-17 MED ORDER — METHOCARBAMOL 1000 MG/10ML IJ SOLN
500.0000 mg | Freq: Four times a day (QID) | INTRAVENOUS | Status: DC | PRN
  Filled 2018-02-17: qty 5

## 2018-02-17 MED ORDER — THROMBIN 5000 UNITS EX SOLR
CUTANEOUS | Status: AC
  Filled 2018-02-17: qty 5000

## 2018-02-17 MED ORDER — DEXAMETHASONE SODIUM PHOSPHATE 4 MG/ML IJ SOLN
INTRAMUSCULAR | Status: DC | PRN
  Administered 2018-02-17: 10 mg via INTRAVENOUS

## 2018-02-17 MED ORDER — MIDAZOLAM HCL 5 MG/5ML IJ SOLN
INTRAMUSCULAR | Status: DC | PRN
  Administered 2018-02-17: 2 mg via INTRAVENOUS

## 2018-02-17 MED ORDER — PHENYLEPHRINE 40 MCG/ML (10ML) SYRINGE FOR IV PUSH (FOR BLOOD PRESSURE SUPPORT)
PREFILLED_SYRINGE | INTRAVENOUS | Status: AC
  Filled 2018-02-17: qty 10

## 2018-02-17 MED ORDER — HYDROCHLOROTHIAZIDE 12.5 MG PO CAPS
12.5000 mg | ORAL_CAPSULE | Freq: Every day | ORAL | Status: DC
  Administered 2018-02-17 – 2018-02-21 (×5): 12.5 mg via ORAL
  Filled 2018-02-17 (×5): qty 1

## 2018-02-17 MED ORDER — ROCURONIUM BROMIDE 100 MG/10ML IV SOLN
INTRAVENOUS | Status: DC | PRN
  Administered 2018-02-17: 80 mg via INTRAVENOUS

## 2018-02-17 MED ORDER — ONDANSETRON HCL 4 MG/2ML IJ SOLN: 4.0000 mg | Freq: Four times a day (QID) | INTRAMUSCULAR | Status: DC | PRN

## 2018-02-17 MED ORDER — KETOROLAC TROMETHAMINE 15 MG/ML IJ SOLN
15.0000 mg | Freq: Four times a day (QID) | INTRAMUSCULAR | Status: AC
  Administered 2018-02-17 – 2018-02-18 (×4): 15 mg via INTRAVENOUS
  Filled 2018-02-17 (×4): qty 1

## 2018-02-17 MED ORDER — BUPIVACAINE HCL (PF) 0.5 % IJ SOLN
INTRAMUSCULAR | Status: AC
  Filled 2018-02-17: qty 30

## 2018-02-17 MED ORDER — LACTATED RINGERS IV SOLN
INTRAVENOUS | Status: DC | PRN
  Administered 2018-02-17 (×3): via INTRAVENOUS

## 2018-02-17 MED ORDER — THROMBIN (RECOMBINANT) 5000 UNITS EX SOLR
OROMUCOSAL | Status: DC | PRN
  Administered 2018-02-17: 07:00:00 via TOPICAL

## 2018-02-17 MED ORDER — SODIUM CHLORIDE 0.9% FLUSH: 3.0000 mL | INTRAVENOUS | Status: DC | PRN

## 2018-02-17 MED ORDER — ROCURONIUM BROMIDE 50 MG/5ML IV SOLN
INTRAVENOUS | Status: AC
  Filled 2018-02-17: qty 1

## 2018-02-17 MED ORDER — FUROSEMIDE 20 MG PO TABS: 20.0000 mg | ORAL_TABLET | Freq: Every day | ORAL | Status: DC | PRN

## 2018-02-17 MED ORDER — FENTANYL CITRATE (PF) 100 MCG/2ML IJ SOLN
INTRAMUSCULAR | Status: DC | PRN
  Administered 2018-02-17 (×5): 100 ug via INTRAVENOUS

## 2018-02-17 MED ORDER — HYDROCODONE-ACETAMINOPHEN 5-325 MG PO TABS
2.0000 | ORAL_TABLET | ORAL | Status: DC | PRN
  Administered 2018-02-17 – 2018-02-20 (×3): 2 via ORAL
  Filled 2018-02-17 (×3): qty 2

## 2018-02-17 MED ORDER — OXYCODONE-ACETAMINOPHEN 10-325 MG PO TABS: 1.0000 | ORAL_TABLET | ORAL | Status: DC | PRN

## 2018-02-17 MED ORDER — SENNA 8.6 MG PO TABS
1.0000 | ORAL_TABLET | Freq: Two times a day (BID) | ORAL | Status: DC
  Administered 2018-02-17 – 2018-02-21 (×8): 8.6 mg via ORAL
  Filled 2018-02-17 (×9): qty 1

## 2018-02-17 MED ORDER — LABETALOL HCL 5 MG/ML IV SOLN
5.0000 mg | INTRAVENOUS | Status: AC | PRN
  Administered 2018-02-17 (×4): 5 mg via INTRAVENOUS

## 2018-02-17 MED ORDER — PHENYLEPHRINE 40 MCG/ML (10ML) SYRINGE FOR IV PUSH (FOR BLOOD PRESSURE SUPPORT)
PREFILLED_SYRINGE | INTRAVENOUS | Status: DC | PRN
  Administered 2018-02-17: 40 ug via INTRAVENOUS

## 2018-02-17 MED ORDER — CEFAZOLIN SODIUM 1 G IJ SOLR
INTRAMUSCULAR | Status: AC
  Filled 2018-02-17: qty 30

## 2018-02-17 MED ORDER — LIDOCAINE HCL (CARDIAC) PF 100 MG/5ML IV SOSY
PREFILLED_SYRINGE | INTRAVENOUS | Status: DC | PRN
  Administered 2018-02-17: 100 mg via INTRAVENOUS

## 2018-02-17 MED ORDER — SUGAMMADEX SODIUM 200 MG/2ML IV SOLN
INTRAVENOUS | Status: DC | PRN
  Administered 2018-02-17: 200 mg via INTRAVENOUS

## 2018-02-17 MED ORDER — LOSARTAN POTASSIUM 50 MG PO TABS
50.0000 mg | ORAL_TABLET | Freq: Every day | ORAL | Status: DC
  Administered 2018-02-17 – 2018-02-21 (×5): 50 mg via ORAL
  Filled 2018-02-17 (×5): qty 1

## 2018-02-17 MED ORDER — ONDANSETRON HCL 4 MG PO TABS: 4.0000 mg | ORAL_TABLET | Freq: Four times a day (QID) | ORAL | Status: DC | PRN

## 2018-02-17 MED ORDER — DOCUSATE SODIUM 100 MG PO CAPS
100.0000 mg | ORAL_CAPSULE | Freq: Two times a day (BID) | ORAL | Status: DC
  Administered 2018-02-17 – 2018-02-21 (×8): 100 mg via ORAL
  Filled 2018-02-17 (×9): qty 1

## 2018-02-17 MED ORDER — LIDOCAINE 2% (20 MG/ML) 5 ML SYRINGE
INTRAMUSCULAR | Status: AC
  Filled 2018-02-17: qty 5

## 2018-02-17 MED ORDER — PROMETHAZINE HCL 25 MG/ML IJ SOLN
6.2500 mg | INTRAMUSCULAR | Status: DC | PRN
Start: 2018-02-17 — End: 2018-02-17

## 2018-02-17 MED ORDER — MORPHINE SULFATE (PF) 4 MG/ML IV SOLN
4.0000 mg | INTRAVENOUS | Status: DC | PRN
  Administered 2018-02-17 – 2018-02-21 (×10): 4 mg via INTRAVENOUS
  Filled 2018-02-17 (×10): qty 1

## 2018-02-17 MED ORDER — SERTRALINE HCL 50 MG PO TABS
50.0000 mg | ORAL_TABLET | Freq: Every day | ORAL | Status: DC
  Administered 2018-02-17 – 2018-02-21 (×5): 50 mg via ORAL
  Filled 2018-02-17 (×5): qty 1

## 2018-02-17 MED ORDER — LOSARTAN POTASSIUM-HCTZ 50-12.5 MG PO TABS: 1.0000 | ORAL_TABLET | Freq: Every day | ORAL | Status: DC

## 2018-02-17 MED ORDER — FENTANYL CITRATE (PF) 250 MCG/5ML IJ SOLN
INTRAMUSCULAR | Status: AC
  Filled 2018-02-17: qty 10

## 2018-02-17 MED ORDER — BACLOFEN 10 MG PO TABS: 10.0000 mg | ORAL_TABLET | Freq: Three times a day (TID) | ORAL | Status: DC | PRN

## 2018-02-17 MED ORDER — LORATADINE 10 MG PO TABS
10.0000 mg | ORAL_TABLET | Freq: Every day | ORAL | Status: DC
  Administered 2018-02-17 – 2018-02-21 (×5): 10 mg via ORAL
  Filled 2018-02-17 (×5): qty 1

## 2018-02-17 MED ORDER — METHOCARBAMOL 500 MG PO TABS
500.0000 mg | ORAL_TABLET | Freq: Four times a day (QID) | ORAL | Status: DC | PRN
  Administered 2018-02-17 – 2018-02-19 (×3): 500 mg via ORAL
  Filled 2018-02-17: qty 1

## 2018-02-17 MED ORDER — OXYCODONE HCL 5 MG PO TABS
ORAL_TABLET | ORAL | Status: AC
  Filled 2018-02-17: qty 1

## 2018-02-17 MED ORDER — HYDROMORPHONE HCL 1 MG/ML IJ SOLN
INTRAMUSCULAR | Status: DC | PRN
  Administered 2018-02-17 (×4): 0.5 mg via INTRAVENOUS

## 2018-02-17 MED ORDER — 0.9 % SODIUM CHLORIDE (POUR BTL) OPTIME
TOPICAL | Status: DC | PRN
  Administered 2018-02-17: 1000 mL

## 2018-02-17 MED ORDER — ALUM & MAG HYDROXIDE-SIMETH 200-200-20 MG/5ML PO SUSP: 30.0000 mL | Freq: Four times a day (QID) | ORAL | Status: DC | PRN

## 2018-02-17 MED ORDER — THROMBIN 20000 UNITS EX SOLR
CUTANEOUS | Status: AC
  Filled 2018-02-17: qty 20000

## 2018-02-17 MED ORDER — OXYCODONE HCL 5 MG PO TABS
5.0000 mg | ORAL_TABLET | Freq: Once | ORAL | Status: AC | PRN
  Administered 2018-02-17: 5 mg via ORAL

## 2018-02-17 MED ORDER — FENTANYL CITRATE (PF) 100 MCG/2ML IJ SOLN
25.0000 ug | INTRAMUSCULAR | Status: DC | PRN
  Administered 2018-02-17: 50 ug via INTRAVENOUS
  Administered 2018-02-17 (×2): 25 ug via INTRAVENOUS
  Administered 2018-02-17: 50 ug via INTRAVENOUS

## 2018-02-17 MED ORDER — PROPOFOL 10 MG/ML IV BOLUS
INTRAVENOUS | Status: DC | PRN
  Administered 2018-02-17: 200 mg via INTRAVENOUS

## 2018-02-17 MED ORDER — FENTANYL CITRATE (PF) 100 MCG/2ML IJ SOLN
INTRAMUSCULAR | Status: AC
  Filled 2018-02-17: qty 2

## 2018-02-17 MED ORDER — BISACODYL 10 MG RE SUPP: 10.0000 mg | Freq: Every day | RECTAL | Status: DC | PRN

## 2018-02-17 SURGICAL SUPPLY — 116 items
APPLIER CLIP 11 MED OPEN (CLIP) ×5
BAG DECANTER FOR FLEXI CONT (MISCELLANEOUS) ×5 IMPLANT
BASE TI IMPLANT 8X38X28 15DEG (Neuro Prosthesis/Implant) ×5 IMPLANT
BASKET BONE COLLECTION (BASKET) IMPLANT
BIT DRILL LONG 3.0X30 (BIT) IMPLANT
BIT DRILL LONG 3X80 (BIT) IMPLANT
BIT DRILL LONG 4X80 (BIT) IMPLANT
BIT DRILL SHORT 3.0X30 (BIT) IMPLANT
BIT DRILL SHORT 3X80 (BIT) IMPLANT
BLADE CLIPPER SURG (BLADE) IMPLANT
BLADE SURG 11 STRL SS (BLADE) ×5 IMPLANT
BOLT BASE TI 5X20 VARIABLE (Bolt) ×15 IMPLANT
BONE MATRIX OSTEOCEL PRO MED (Bone Implant) ×15 IMPLANT
BUR BARREL STRAIGHT FLUTE 4.0 (BURR) IMPLANT
BUR MATCHSTICK NEURO 3.0 LAGG (BURR) ×5 IMPLANT
CAGE MODULUS XL 10X18X50 - 10 (Cage) ×5 IMPLANT
CAGE MODULUS XL 10X18X55 - 10 (Cage) ×5 IMPLANT
CANISTER SUCT 3000ML PPV (MISCELLANEOUS) ×5 IMPLANT
CARTRIDGE OIL MAESTRO DRILL (MISCELLANEOUS) ×4 IMPLANT
CLIP APPLIE 11 MED OPEN (CLIP) ×4 IMPLANT
CLIP LIGATING EXTRA MED SLVR (CLIP) ×5 IMPLANT
CLIP LIGATING EXTRA SM BLUE (MISCELLANEOUS) ×5 IMPLANT
CONT SPEC 4OZ CLIKSEAL STRL BL (MISCELLANEOUS) ×5 IMPLANT
COVER BACK TABLE 24X17X13 BIG (DRAPES) IMPLANT
COVER BACK TABLE 60X90IN (DRAPES) ×10 IMPLANT
DECANTER SPIKE VIAL GLASS SM (MISCELLANEOUS) ×5 IMPLANT
DERMABOND ADVANCED (GAUZE/BANDAGES/DRESSINGS) ×3
DERMABOND ADVANCED .7 DNX12 (GAUZE/BANDAGES/DRESSINGS) ×12 IMPLANT
DIFFUSER DRILL AIR PNEUMATIC (MISCELLANEOUS) ×5 IMPLANT
DRAPE C-ARM 42X72 X-RAY (DRAPES) ×10 IMPLANT
DRAPE C-ARMOR (DRAPES) ×15 IMPLANT
DRAPE LAPAROTOMY 100X72X124 (DRAPES) ×15 IMPLANT
DRAPE POUCH INSTRU U-SHP 10X18 (DRAPES) ×5 IMPLANT
DRAPE SHEET LG 3/4 BI-LAMINATE (DRAPES) ×5 IMPLANT
DURAPREP 26ML APPLICATOR (WOUND CARE) ×15 IMPLANT
ELECT BLADE 4.0 EZ CLEAN MEGAD (MISCELLANEOUS) ×10
ELECT REM PT RETURN 9FT ADLT (ELECTROSURGICAL) ×15
ELECTRODE BLDE 4.0 EZ CLN MEGD (MISCELLANEOUS) ×8 IMPLANT
ELECTRODE REM PT RTRN 9FT ADLT (ELECTROSURGICAL) ×12 IMPLANT
GAUZE SPONGE 4X4 16PLY XRAY LF (GAUZE/BANDAGES/DRESSINGS) IMPLANT
GLOVE BIO SURGEON STRL SZ 6.5 (GLOVE) ×15 IMPLANT
GLOVE BIOGEL PI IND STRL 6.5 (GLOVE) ×4 IMPLANT
GLOVE BIOGEL PI IND STRL 7.5 (GLOVE) IMPLANT
GLOVE BIOGEL PI IND STRL 8.5 (GLOVE) ×12 IMPLANT
GLOVE BIOGEL PI INDICATOR 6.5 (GLOVE) ×1
GLOVE BIOGEL PI INDICATOR 7.5 (GLOVE)
GLOVE BIOGEL PI INDICATOR 8.5 (GLOVE) ×3
GLOVE ECLIPSE 8.5 STRL (GLOVE) ×20 IMPLANT
GLOVE EXAM NITRILE LRG STRL (GLOVE) IMPLANT
GLOVE EXAM NITRILE XL STR (GLOVE) IMPLANT
GLOVE EXAM NITRILE XS STR PU (GLOVE) IMPLANT
GLOVE SS BIOGEL STRL SZ 7.5 (GLOVE) ×4 IMPLANT
GLOVE SUPERSENSE BIOGEL SZ 7.5 (GLOVE) ×1
GOWN STRL REUS W/ TWL LRG LVL3 (GOWN DISPOSABLE) ×16 IMPLANT
GOWN STRL REUS W/ TWL XL LVL3 (GOWN DISPOSABLE) ×8 IMPLANT
GOWN STRL REUS W/TWL 2XL LVL3 (GOWN DISPOSABLE) ×15 IMPLANT
GOWN STRL REUS W/TWL LRG LVL3 (GOWN DISPOSABLE) ×4
GOWN STRL REUS W/TWL XL LVL3 (GOWN DISPOSABLE) ×2
GUIDEWIRE NITINOL BEVEL TIP (WIRE) ×40 IMPLANT
HEMOSTAT POWDER KIT SURGIFOAM (HEMOSTASIS) IMPLANT
HEMOSTAT POWDER SURGIFOAM 1G (HEMOSTASIS) ×5 IMPLANT
INSERT FOGARTY 61MM (MISCELLANEOUS) IMPLANT
INSERT FOGARTY SM (MISCELLANEOUS) IMPLANT
KIT BASIN OR (CUSTOM PROCEDURE TRAY) ×10 IMPLANT
KIT DILATOR XLIF 5 (KITS) ×4 IMPLANT
KIT SPINE MAZOR X ROBO DISP (MISCELLANEOUS) ×5 IMPLANT
KIT SURGICAL ACCESS MAXCESS 4 (KITS) ×5 IMPLANT
KIT TURNOVER KIT B (KITS) ×15 IMPLANT
KIT XLIF (KITS) ×1
LOOP VESSEL MAXI BLUE (MISCELLANEOUS) IMPLANT
LOOP VESSEL MINI RED (MISCELLANEOUS) IMPLANT
MARKER SKIN DUAL TIP RULER LAB (MISCELLANEOUS) ×5 IMPLANT
MODULE EMG NEEDLE SSEP NVM5 (NEEDLE) ×5 IMPLANT
MODULE NVM5 NEXT GEN EMG (NEEDLE) ×5 IMPLANT
NEEDLE HYPO 25X1 1.5 SAFETY (NEEDLE) ×10 IMPLANT
NEEDLE SPNL 18GX3.5 QUINCKE PK (NEEDLE) ×5 IMPLANT
NS IRRIG 1000ML POUR BTL (IV SOLUTION) ×5 IMPLANT
OIL CARTRIDGE MAESTRO DRILL (MISCELLANEOUS) ×5
PACK LAMINECTOMY NEURO (CUSTOM PROCEDURE TRAY) ×15 IMPLANT
PAD ARMBOARD 7.5X6 YLW CONV (MISCELLANEOUS) ×25 IMPLANT
PIN HEAD 2.5X60MM (PIN) IMPLANT
ROD RELINE MAS LORD 5.5X90MM (Rod) ×5 IMPLANT
ROD RELINE MAS LORD 5.5X95MM (Rod) ×5 IMPLANT
SCREW LOCK RELINE 5.5 TULIP (Screw) ×40 IMPLANT
SCREW MAS RELINE 6.5X45 POLY (Screw) ×30 IMPLANT
SCREW MAS RELINE POLY 6.5X40 (Screw) ×10 IMPLANT
SCREW SCHANZ SA 4.0MM (MISCELLANEOUS) IMPLANT
SPONGE INTESTINAL PEANUT (DISPOSABLE) ×10 IMPLANT
SPONGE LAP 18X18 X RAY DECT (DISPOSABLE) ×5 IMPLANT
SPONGE LAP 4X18 RFD (DISPOSABLE) IMPLANT
SPONGE SURGIFOAM ABS GEL 100 (HEMOSTASIS) IMPLANT
STAPLER VISISTAT 35W (STAPLE) IMPLANT
SUT PDS AB 1 CTX 36 (SUTURE) ×5 IMPLANT
SUT PROLENE 4 0 RB 1 (SUTURE)
SUT PROLENE 4-0 RB1 .5 CRCL 36 (SUTURE) IMPLANT
SUT PROLENE 5 0 CC1 (SUTURE) IMPLANT
SUT PROLENE 6 0 C 1 30 (SUTURE) ×5 IMPLANT
SUT PROLENE 6 0 CC (SUTURE) IMPLANT
SUT SILK 0 TIES 10X30 (SUTURE) ×5 IMPLANT
SUT SILK 2 0 TIES 10X30 (SUTURE) ×10 IMPLANT
SUT SILK 2 0SH CR/8 30 (SUTURE) IMPLANT
SUT SILK 3 0 TIES 10X30 (SUTURE) ×5 IMPLANT
SUT SILK 3 0SH CR/8 30 (SUTURE) IMPLANT
SUT VIC AB 0 CT1 27 (SUTURE) ×2
SUT VIC AB 0 CT1 27XBRD ANBCTR (SUTURE) ×8 IMPLANT
SUT VIC AB 1 CT1 18XBRD ANBCTR (SUTURE) ×16 IMPLANT
SUT VIC AB 1 CT1 8-18 (SUTURE) ×4
SUT VIC AB 2-0 CP2 18 (SUTURE) ×25 IMPLANT
SUT VIC AB 3-0 SH 8-18 (SUTURE) ×20 IMPLANT
SUT VIC AB 4-0 PS2 27 (SUTURE) ×5 IMPLANT
SUT VICRYL 4-0 PS2 18IN ABS (SUTURE) IMPLANT
TOWEL GREEN STERILE (TOWEL DISPOSABLE) ×20 IMPLANT
TOWEL GREEN STERILE FF (TOWEL DISPOSABLE) ×25 IMPLANT
TRAY FOLEY MTR SLVR 16FR STAT (SET/KITS/TRAYS/PACK) ×15 IMPLANT
TUBE MAZOR SA REDUCTION (TUBING) ×5 IMPLANT
WATER STERILE IRR 1000ML POUR (IV SOLUTION) ×10 IMPLANT

## 2018-02-17 NOTE — Anesthesia Procedure Notes (Signed)
Arterial Line Insertion Start/End5/31/2019 7:50 AM, 02/17/2018 8:00 AM Performed by: Idaly Verret T, Immunologist, CRNA  Patient location: OR. Preanesthetic checklist: patient identified, IV checked, site marked, risks and benefits discussed, surgical consent, monitors and equipment checked and pre-op evaluation Patient sedated Left, radial was placed Catheter size: 20 G Hand hygiene performed  and maximum sterile barriers used   Attempts: 1 Procedure performed without using ultrasound guided technique. Following insertion, dressing applied and Biopatch. Post procedure assessment: normal  Patient tolerated the procedure well with no immediate complications.

## 2018-02-17 NOTE — Transfer of Care (Signed)
Immediate Anesthesia Transfer of Care Note  Patient: Ashley [name]  Procedure(s) Performed: Lumbar five-Sacral one Anterior lumbar interbody fusion (N/A Abdomen) Lumbar three-four Lumbar four-five  Anterolateral lumbar interbody fusion (N/A Flank) APPLICATION OF ROBOTIC ASSISTANCE FOR SPINAL PROCEDURE (N/A ) LUMBAR PERCUTANEOUS PEDICLE SCREW PLACEMENT LUMBAR THREE-SACRAL ONE (N/A Back) ABDOMINAL EXPOSURE (N/A )  Patient Location: PACU  Anesthesia Type:General  Level of Consciousness: awake, drowsy and patient cooperative  Airway & Oxygen Therapy: Patient Spontanous Breathing and Patient connected to nasal cannula oxygen  Post-op Assessment: Report given to RN, Post -op Vital signs reviewed and stable and Patient moving all extremities  Post vital signs: Reviewed and stable  Last Vitals:  Vitals Value Taken Time  BP 172/109 02/17/2018  5:40 PM  Temp    Pulse 91 02/17/2018  5:40 PM  Resp 12 02/17/2018  5:40 PM  SpO2 100 % 02/17/2018  5:40 PM  Vitals shown include unvalidated device data.  Last Pain:  Vitals:   02/17/18 0617  PainSc: 6       Patients Stated Pain Goal: 3 (70/01/74 9449)  Complications: No apparent anesthesia complications

## 2018-02-17 NOTE — Progress Notes (Signed)
Vitals:   02/17/18 1900 02/17/18 1916 02/17/18 1930 02/17/18 2034  BP:  (!) 149/96  (!) 147/86  Pulse: 69 76  72  Resp: 11 11  16   Temp:   (!) 97.2 F (36.2 C) 97.6 F (36.4 C)  TempSrc:    Oral  SpO2: 96% 98%  100%     Called by nursing staff because of concerns of lower extremity neurologic changes, in particular numbness and question of weakness in right lower extremity.  Patient seen, with her daughter at the bedside along with her nurse Jami.  She is lethargic, but following commands.  Strength is 5/5 in the dorsiflexor and plantar flexor bilaterally.  She does have limitations in the mobility of the proximal right lower extremity (her XLIF approach was from the right side) and is probably due to the right-sided lateral approach.  She does feel touch briskly through the lower extremities.  Plan: Reassured patient and her daughter that the proximal right lower extremity limitations are common following an XLIF procedure and improve with time.  I have explained to them and the nursing staff that her significant discomfort will continue over the next several days, but gradually improve, as will her mobility.  We will need to keep the Foley catheter in until she is up and ambulating.  Hosie Spangle, MD 02/17/2018, 9:11 PM

## 2018-02-17 NOTE — Anesthesia Preprocedure Evaluation (Addendum)
Anesthesia Evaluation  Patient identified by MRN, date of birth, ID band Patient awake    Reviewed: Allergy & Precautions, NPO status , Patient's Chart, lab work & pertinent test results  History of Anesthesia Complications (+) PONV and history of anesthetic complications  Airway Mallampati: III  TM Distance: >3 FB Neck ROM: Full    Dental  (+) Teeth Intact, Dental Advisory Given   Pulmonary neg pulmonary ROS,    breath sounds clear to auscultation       Cardiovascular hypertension, Pt. on medications  Rhythm:Regular Rate:Normal     Neuro/Psych Depression  Neuromuscular disease    GI/Hepatic negative GI ROS, Neg liver ROS,   Endo/Other  Obesity  Renal/GU negative Renal ROS     Musculoskeletal  (+) Arthritis ,   Abdominal (+) + obese,   Peds negative pediatric ROS (+)  Hematology negative hematology ROS (+)   Anesthesia Other Findings   Reproductive/Obstetrics PCOS                             Lab Results  Component Value Date   WBC 6.4 02/08/2018   HGB 11.4 (L) 02/08/2018   HCT 36.6 02/08/2018   MCV 80.8 02/08/2018   PLT 216 02/08/2018   Lab Results  Component Value Date   CREATININE 0.88 02/08/2018   BUN 9 02/08/2018   NA 140 02/08/2018   K 4.1 02/08/2018   CL 105 02/08/2018   CO2 30 02/08/2018   Lab Results  Component Value Date   INR 0.95 09/14/2013   INR 0.88 11/06/2011   INR 2.25 (H) 09/01/2010   03/2016 KEG: NSR   Anesthesia Physical  Anesthesia Plan  ASA: II  Anesthesia Plan: General   Post-op Pain Management:    Induction: Intravenous  PONV Risk Score and Plan: 4 or greater and Treatment may vary due to age or medical condition, Ondansetron, Dexamethasone, Midazolam and Scopolamine patch - Pre-op  Airway Management Planned: Oral ETT  Additional Equipment: Arterial line  Intra-op Plan:   Post-operative Plan: Extubation in OR  Informed Consent:  I have reviewed the patients History and Physical, chart, labs and discussed the procedure including the risks, benefits and alternatives for the proposed anesthesia with the patient or authorized representative who has indicated his/her understanding and acceptance.   Dental advisory given  Plan Discussed with: CRNA and Anesthesiologist  Anesthesia Plan Comments: (2 large bore IV's)       Anesthesia Quick Evaluation

## 2018-02-17 NOTE — Anesthesia Procedure Notes (Signed)
Procedure Name: Intubation Date/Time: 02/17/2018 7:47 AM Performed by: Lexie Koehl T, CRNA Pre-anesthesia Checklist: Patient identified, Emergency Drugs available, Suction available and Patient being monitored Patient Re-evaluated:Patient Re-evaluated prior to induction Oxygen Delivery Method: Circle system utilized Preoxygenation: Pre-oxygenation with 100% oxygen Induction Type: IV induction Ventilation: Mask ventilation without difficulty and Oral airway inserted - appropriate to patient size Laryngoscope Size: Mac and 4 Grade View: Grade II Tube type: Oral Tube size: 7.5 mm Number of attempts: 1 Airway Equipment and Method: Patient positioned with wedge pillow and Stylet Placement Confirmation: ETT inserted through vocal cords under direct vision,  positive ETCO2 and breath sounds checked- equal and bilateral Secured at: 21 cm Dental Injury: Teeth and Oropharynx as per pre-operative assessment

## 2018-02-17 NOTE — H&P (Signed)
Ashley [name] is an 42 y.o. female.   Chief Complaint: Back pain bilateral lower extremity pain left worse than right HPI: Ashley [name] is a 42 year old individual whose had significant problems with disc herniation that started several years ago.  She underwent a discectomy and postoperatively initially seemed to feel better but then gradually worsened with worsening pain not only in her leg but also in her back.  Over time it was noted that she developed degenerative changes in the discs at L4-5 and L3-4.  She is notably obese at that time and worked to lose substantial weight and improve her mobility.  Despite this she has Chronic back pain and chronic radicular pain.  After careful assessment of her back and failing multiple efforts at conservative management I have advised that she should ultimately undergo decompression and fixation of the lumbar spine from L3 to the sacrum.  This is now being done as a staged minimally invasive procedure with an anterior lumbar interbody arthrodesis at L5-S1 followed by lateral decompression indirectly with ex lift at L3-4 and L4-5 and percutaneous posterior fixation from L3 to sacrum.  This will be robotically assisted.  Past Medical History:  Diagnosis Date  . Arthritis    knees, back  . 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 Complication of anesthesia   . Depression   . History of pneumonia   . Hypertension   . Lumbar spondylosis   . PCOS (polycystic ovarian syndrome)   . Pneumonia    2016  . PONV (postoperative nausea and vomiting)     Past Surgical History:  Procedure Laterality Date  . ANKLE SURGERY     x 2  . BACK SURGERY     2017  discectomy  . CARPAL TUNNEL RELEASE Left 07/04/2014   Procedure: LEFT CARPAL TUNNEL RELEASE;  Surgeon: Ninetta Lights, MD;  Location: [facility];  Service: Orthopedics;  Laterality: Left;  . CARPAL TUNNEL RELEASE Right 08/08/2014   Procedure: RIGHT CARPAL TUNNEL RELEASE;  Surgeon:  Ninetta Lights, MD;  Location: [facility];  Service: Orthopedics;  Laterality: Right;  . DILATION AND CURETTAGE OF UTERUS    . DILATION AND EVACUATION  04/02/2011   Procedure: DILATATION AND EVACUATION (D&E);  Surgeon: Luz Lex, MD;  Location: [facility];  Service: Gynecology;  Laterality: N/A;  dvt left mid thigh  . DORSAL COMPARTMENT RELEASE Left 07/04/2014   Procedure: LEFT DEQUERVAINS;  Surgeon: Ninetta Lights, MD;  Location: [facility];  Service: Orthopedics;  Laterality: Left;  . ENDOSCOPIC PLANTAR FASCIOTOMY Left 02/01/2002  . HYSTEROSCOPY W/D&C  07/17/2010   with exc. endometrial polyps  . KNEE ARTHROSCOPY Right 03/07/2003; 01/14/2005; 08/31/2006  . KNEE ARTHROSCOPY Left 03/21/2003; 11/26/2004  . SHOULDER ARTHROSCOPY Right   . TOE SURGERY Left 01/10/2003   claw toe correction 2nd, 3rd, 4th toes  . TOTAL HIP ARTHROPLASTY Right 04/21/2016  . TOTAL HIP ARTHROPLASTY Right 04/21/2016   Procedure: TOTAL HIP ARTHROPLASTY ANTERIOR APPROACH;  Surgeon: Ninetta Lights, MD;  Location: [facility];  Service: Orthopedics;  Laterality: Right;  . TOTAL KNEE ARTHROPLASTY Left 01/21/2010  . TOTAL KNEE ARTHROPLASTY Right 08/26/2008    Family History  Problem Relation Age of Onset  . Cancer Maternal Grandmother        UTERINE  . Cancer Paternal Grandmother        breast  . Diabetes Father   . Cancer Father        COLON  .  Heart disease Father   . Kidney disease Father        dialysis  . Diabetes Sister    Social History:  reports that she has never smoked. She has never used smokeless tobacco. She reports that she does not drink alcohol or use drugs.  Allergies:  Allergies  Allergen Reactions  . Gadolinium Derivatives Nausea And Vomiting    Pt describes this happens every time she gets gado even with slow injection  . Codeine Itching and Nausea Only  . Cymbalta [Duloxetine Hcl] Other (See Comments)    Altered mental status    Medications Prior to Admission  Medication  Sig Dispense Refill  . ALPRAZolam (XANAX) 1 MG tablet Take 1 mg by mouth daily as needed for anxiety.    . baclofen (LIORESAL) 10 MG tablet Take 10 mg by mouth 3 (three) times daily as needed for muscle spasms.     Marland Kitchen buPROPion (WELLBUTRIN XL) 300 MG 24 hr tablet Take 300 mg daily by mouth.    . cetirizine (ZYRTEC) 10 MG tablet Take 10 mg by mouth as needed.     . docusate sodium (COLACE) 100 MG capsule Take 100 mg by mouth 2 (two) times daily as needed for mild constipation.    Mar[place] Kitchen EPINEPHrine 0.3 mg/0.3 mL IJ SOAJ injection Inject 0.3 mg into the muscle as needed (allergic reaction).   0  . furosemide (LASIX) 20 MG tablet Take 20 mg by mouth daily as needed for fluid.    Marland Kitchen gabapentin (NEURONTIN) 300 MG capsule Take 600 mg by mouth 4 (four) times daily.     Marland Kitchen ibuprofen (ADVIL,MOTRIN) 600 MG tablet Take 600 mg by mouth 2 (two) times daily as needed for pain.  2  . Lorcaserin HCl ER (BELVIQ XR) 20 MG TB24 Take 20 mg by mouth daily.    Marland Kitchen losartan-hydrochlorothiazide (HYZAAR) 50-12.5 MG tablet Take 1 tablet by mouth daily. 90 tablet 3  . methocarbamol (ROBAXIN) 500 MG tablet Take 500 mg by mouth every 6 (six) hours as needed for muscle spasms.    . Multiple Vitamins-Minerals (MULTIVITAMIN WITH MINERALS) tablet Take 1 tablet daily by mouth.    . nabumetone (RELAFEN) 500 MG tablet Take 500 mg 2 (two) times daily by mouth.    . oxyCODONE-acetaminophen (PERCOCET) 10-325 MG tablet Take 1 tablet every 6 (six) hours as needed by mouth for pain.    Mar[place] Kitchen sertraline (ZOLOFT) 50 MG tablet Take 50-100 mg by mouth See admin instructions. Take 50 mg once daily for 7 days then take 100 mg once daily      No results found for this or any previous visit (from the past 48 hour(s)). No results found.  Review of Systems  Constitutional: Positive for malaise/fatigue.  Eyes: Negative.   Respiratory: Negative.   Cardiovascular: Negative.   Gastrointestinal:       Planned weight loss  Genitourinary: Negative.    Musculoskeletal: Positive for back pain.  Skin: Negative.   Neurological: Positive for tingling, focal weakness and weakness.    Blood pressure (!) 157/98, pulse 73, temperature 98.5 F (36.9 C), resp. rate 20, last menstrual period 01/28/2018, SpO2 100 %. Physical Exam  Constitutional: She is oriented to person, place, and time. She appears well-developed and well-nourished.  HENT:  Head: Normocephalic and atraumatic.  Neck: Normal range of motion. Neck supple.  Cardiovascular: Normal rate and regular rhythm.  Respiratory: Effort normal and breath sounds normal.  GI: Soft. Bowel sounds are normal.  Musculoskeletal:  Moderate  weakness in tibialis anterior on the right extensor houses longus on the right is weak to 4 out of 5 left-sided strength is intact deep tendon reflexes are absent in the right patella and Achilles 1+ in the left patellae absent left Achilles.  Upper extremity reflexes and strength are normal.  Cranial nerve examination is normal.  Gait notes some marked antalgia on the right lower extremity.  Neurological: She is alert and oriented to person, place, and time. She has normal reflexes.  Positive straight leg raising at 30 degrees both lower extremities Patrick's maneuver is negative bilaterally.  Palpation and percussion of the lumbosacral junction reproduces moderate discomfort.  Skin: Skin is warm and dry.  Psychiatric: She has a normal mood and affect. Her behavior is normal. Judgment and thought content normal.     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  Plan: Anterior lumbar interbody arthrodesis L5-S1.  Lateral indirect decompression L3-4 L4-5 with ex lift spacer.  Posterior percutaneous fixation L3 to sacrum with robotically assisted screw placement.  Earleen Newport, MD 02/17/2018, 7:28 AM

## 2018-02-17 NOTE — Progress Notes (Signed)
Pt states she can not feel her legs bilaterally, asking RN to reposition them in bed.  This is a new change in condition, spoke with PACU nurse who says pt had none of these complaints earlier.  Paged on call.

## 2018-02-17 NOTE — Op Note (Signed)
Date of surgery: Feb 17, 2018 Preoperative diagnosis: Lumbar spondylosis with radiculopathy degenerative disc disease L3-4 L4-5 L5-S1 Postoperative diagnosis same Procedure: Anterior lumbar decompression arthrodesis L5-S1 Surgeon: Kristeen Miss Approach: Dr. Curt Jews First assistant: Dr. Jovita Gamma Anesthesia: General endotracheal Indications: Ashley [name] is a 42 year old individual with had a previous discectomy at L5-S1.  She had advanced degenerative disc disease at L5-S1 with persistent radiculopathy.  She is refractory to all forms of conservative management and ultimately I advised surgical decompression and stabilization via an anterior lumbar interbody arthrodesis.  This was to be part of a staged procedure to include anterolateral decompressions at L3-4 L4-5 with posterior segmental fixation from L3 to the sacrum using robotically place pedicle screws.  This is the first portion of that operation.  Procedure: Patient was brought to the operating room supine on the stretcher.  After prepping the anterior abdomen and localizing L5-S1 on the skin radiographically Dr. Sherren Mocha Early performed an approach to the L5-S1 region with a retroperitoneal approach.  Once retractors were in place I open the anterior longitudinal ligament at L5-S1.  A significant quantity of severely degenerated and desiccated disc material was removed from the L5-S1 space.  And then the endplates were decorticated as the procedure was carried towards the posterior aspect of the interspace.  A series of curettes and rongeurs were used to evacuate severely desiccated disc material and then the posterior longitudinal ligament was opened first on the left side and the dissection was then carried over to the right side which had previously been operated on.  Some epidural fibrosis in this region was identified but then some subligamentous disc material was also identified and this was removed.  The common dural tube and the L5  and S1 nerve roots were well decompressed.  Endplates were then rongeured smoothly with a high-speed bur and a 3 mm dissecting tool.  The interspace was sized for a 15 mm tall 15 degree lordotic medium size titanium spacer.  This was placed into the interspace under fluoroscopic guidance and when set appropriately was anchored with 2 screws inferiorly into S1 and one screw superiorly into L5 these were 20 mm screws.  Final radiographs were obtained in AP and lateral projection demonstrated good fixation of the device.  Hemostasis was then carefully checked and the retractors were sequentially removed.  The anterior rectus fascia was then closed with a singular running #1 PDS suture.  2-0 Vicryl was used in subtenons tissues and 3-0 Vicryl subcuticularly with a 4-0 running Monocryl suture in the subcuticular tissues most superiorly.  Dermabond was used on the skin.  Blood loss for the procedure was estimated at less than 100 cc.

## 2018-02-17 NOTE — Op Note (Signed)
    OPERATIVE REPORT  DATE OF SURGERY: 02/17/2018  PATIENT: Ashley [name], 42 y.o. female MRN: [id number]  DOB: Feb 04, 1976  PRE-OPERATIVE DIAGNOSIS: Degenerative disc disease  POST-OPERATIVE DIAGNOSIS:  Same  PROCEDURE: Anterior exposure for L5-S1 disc surgery  SURGEON:  Curt Jews, M.D.  Co-surgeon for the exposure Dr. Kristeen Miss  ANESTHESIA: General  EBL: 100 ml  Total I/O In: 1000 [I.V.:1000] Out: 600 [Urine:600]  BLOOD ADMINISTERED: None  DRAINS: None  SPECIMEN: None  COUNTS CORRECT:  YES  PLAN OF CARE: PACU  PATIENT DISPOSITION:  PACU - hemodynamically stable  PROCEDURE DETAILS: The patient was taken to the operative placed in supine position where a crosstable lateral C arm was used to identify the level of the L5-S1 disc in relation to the anterior abdominal wall.  An incision was made from the midline to the left transversely and carried down through the subcutaneous fat with electrocautery.  The anterior rectus sheath was opened in line with the skin incision and the rectus muscle was mobilized circumferentially.  The retroperitoneal space was entered in the left lower quadrant and the intraperitoneal contents were mobilized bluntly to the right.  The ureter was identified and mobilized to the right as well.  Dissection was continued down to the level of the L5-S1 disc.  The right and left iliac vessels were bluntly mobilized off the L5-S1 disc.  The middle sacral vessels were clipped and divided.  Blunt dissection was continued to give adequate superior and inferior exposure as well.  The rectus muscle was moved to the left.  The Thompson retractor was brought onto the field and the reverse lip 200 blade was positioned to the right of the L5-S1 disc.  The reverse lip 150 blade was positioned to the left of the L5-S1 disc.  Malleable exposure malleable retractors were used for superior and inferior exposure.  A needle was placed in the L5-S1 disc and C-arm was  brought back onto the field to confirm that this was the correct level.  The remainder of the procedure will be dictated as a separate note by Dr. Magda Kiel, M.D., [facility] 02/17/2018 9:43 AM

## 2018-02-17 NOTE — Op Note (Signed)
Day of surgery: Feb 17, 2018 Preoperative diagnosis: Degenerative disc disease L3-4 L4-5 and L5-S1 with chronic lumbar radiculopathy. Postoperative diagnosis: Same Procedure: #1.  Anterolateral decompression L3-4 and L4-5 with indirect decompression of L3-4 using an ex lift technique placing 10 mm lordotic 18 mm wide by 55 mm long spacer into L3-4 and L4-5.  Arthrodesis with allograft using nuvasive allograft. 2.:  Percutaneous fixation L3 to the sacrum with robotically place screws using Mazor robot. Surgeon: Kristeen Miss First assistant: Jovita Gamma, MD. Anesthesia: General endotracheal Indications: Ashley [name] at [facility] is having surgery to decompress and stabilize L3-4 L4-5 and L5-S1.  There is a second portion of a staged procedure after the patient had an anterior lumbar interbody arthrodesis performed under the same anesthesia.  The second portion was done in 2 stages with an anterolateral decompression and the patient in the lateral position and then the patient being placed prone onto a Jackson table for the robotically placed screws and percutaneous fixation.  Electromyographic and somatosensory monitoring were performed for placement of the anterolateral decompression and fusion.  The retractor was then placed over the largest dilators and stimulation was performed over the posterior aspect of the retractor to allow for placement of a shim into the interspace at L4-L5.  Once the shin was located the retractor was open light sources were placed into the retractor and the disc space was isolated.  The disc was then opened with a #15 blade and a combination of curettes and rongeurs was used to evacuate a substantial quantity of severely degenerated and desiccated disc material.  The disc space itself was rather tight and only a 60mm shaver could be used to remove disc material and some endplate material a series of dilators were then passed to dilate this to an 8 mm size this allowed for placement of  more rongeurs into the interspace to decorticate the endplates.  Ultimately it was felt that a 10 mm tall lordotic spacer measuring 18 mm in width and 50 mm in length was placed across the disc space at L4-L5.  This was filled with allograft from [facility].  Once the graft was placed and localized orthogonally with radiographic control the retractor was removed and attention was turned to L3-4.  He was similar process was carried out with the interspace being even tighter than it was at the L4-5 level a series of dilators were then used to allow distraction to the 18 mm with of an 8 mm tall interspace spacer after decorticating adequately were able to place a 10 mm tall 18 mm wide lordotic graft of titanium this was also filled with an invasive bone graft.  Once this was placed and radiographic confirmation was obtained the retractors were removed and then the fascia was closed with 2-0 Vicryl in interrupted fashion and 3-0 Vicryl was used in the subarticular skin Dermabond was placed on the skin.  Patient was then carefully turned from the lateral decubitus position prone onto the [facility] table.  The back was prepped with alcohol DuraPrep and draped in a sterile fashion.  The robotic arm was attached to the table and a pin was placed in the right posterior superior iliac crest to allow attachment of the robotic arm.  Once the robot was attached the program for placement of screws at L3-L4-L5 and S1 was reviewed and confirmed.  Fluoroscopic images were obtained to register the patient's lumbar spine to the CT data and the robot itself.  Then entry sites were chosen at L3-L4-L5 and  S1.  The skin was marked first and 2 vertical incision was created on either side from L3 to the sacrum.  These were infiltrated with 10 cc of lidocaine with Marcaine.  Then the entry sites were chosen at L3 and K wires were placed into L3-L4-L5 and the sacrum.  The registration of the sacrum was not completely accurate and some  manipulation of the robotic arm to achieve the appropriate trajectory verified fluoroscopically was performed.  Ultimately 6.5 x 45 mm screws were placed in L3-L4 and S1 L5 had 6.5 x 40 mm screws placed.  Rod length was measured and on the left side and 95 mm long rod was placed between the screws at L3 to the sacrum on the right side and 90 mm  rod was placed.  These were tightened into a neutral construct.  Final radiographs were obtained in AP and lateral projection and then the wounds were closed with 2-0 Vicryl in interrupted fashion and 3-0 Vicryl subcuticularly.  Dr. Sherwood Gambler help for both stages of this operation.  Blood loss is estimated at 400 cc as the entry sites for the K wires were quite hemorrhagic.  Procedure: Patient had completed her anterior lumbar interbody arthrodesis.  She was then placed into the left lateral decubitus position.  The hips were placed inferior to the break in the patient's body was taped in the lateral position with orthogonal allergy being checked radiographically.  The break in the table was used to open the L4-5 space on the right side.  The skin was marked for chosen entry sites and incision making a common incision was chosen between L3-4 and L4-5.  The skin was then prepped with alcohol DuraPrep and draped in a sterile fashion.  Chosen incision was made and a second incision was made posteriorly to locate the retroperitoneal space.  Using a blunt instrument the retroperitoneum was entered from the posterior incision and finger dissection was used to identify the edge of the psoas muscle.  The lateral incision was opened and then a 5 mm probe was placed through the lateral incision and finger guided into the retroperitoneal space and then through the psoas muscle to doc on the interspace at L4-L5.  This was checked radiographically.  K wire was then placed into the lateral aspect of the disc space at L4-5 and a series of stimulations were performed as dilators were  passed.

## 2018-02-18 DIAGNOSIS — D62 Acute posthemorrhagic anemia: Secondary | ICD-10-CM

## 2018-02-18 DIAGNOSIS — F329 Major depressive disorder, single episode, unspecified: Secondary | ICD-10-CM

## 2018-02-18 DIAGNOSIS — E669 Obesity, unspecified: Secondary | ICD-10-CM

## 2018-02-18 DIAGNOSIS — I10 Essential (primary) hypertension: Secondary | ICD-10-CM

## 2018-02-18 DIAGNOSIS — G8918 Other acute postprocedural pain: Secondary | ICD-10-CM

## 2018-02-18 DIAGNOSIS — E282 Polycystic ovarian syndrome: Secondary | ICD-10-CM

## 2018-02-18 DIAGNOSIS — F32A Depression, unspecified: Secondary | ICD-10-CM

## 2018-02-18 DIAGNOSIS — M159 Polyosteoarthritis, unspecified: Secondary | ICD-10-CM

## 2018-02-18 DIAGNOSIS — M5416 Radiculopathy, lumbar region: Secondary | ICD-10-CM

## 2018-02-18 LAB — BASIC METABOLIC PANEL
Anion gap: 6 (ref 5–15)
BUN: 7 mg/dL (ref 6–20)
CO2: 27 mmol/L (ref 22–32)
Calcium: 8.1 mg/dL — ABNORMAL LOW (ref 8.9–10.3)
Chloride: 104 mmol/L (ref 101–111)
Creatinine, Ser: 0.83 mg/dL (ref 0.44–1.00)
GFR calc Af Amer: >60 mL/min (ref >60)
GFR calc non Af Amer: >60 mL/min (ref >60)
Glucose, Bld: 134 mg/dL — ABNORMAL HIGH (ref 65–99)
Potassium: 3.8 mmol/L (ref 3.5–5.1)
Sodium: 137 mmol/L (ref 135–145)

## 2018-02-18 LAB — CBC
HCT: 30.4 % — ABNORMAL LOW (ref 36.0–46.0)
Hemoglobin: 9.4 g/dL — ABNORMAL LOW (ref 12.0–15.0)
MCH: 25 pg — ABNORMAL LOW (ref 26.0–34.0)
MCHC: 30.9 g/dL (ref 30.0–36.0)
MCV: 80.9 fL (ref 78.0–100.0)
Platelets: 225 10*3/uL (ref 150–400)
RBC: 3.76 MIL/uL — ABNORMAL LOW (ref 3.87–5.11)
RDW: 15.3 % (ref 11.5–15.5)
WBC: 7.3 10*3/uL (ref 4.0–10.5)

## 2018-02-18 MED ORDER — BACLOFEN 10 MG PO TABS
20.0000 mg | ORAL_TABLET | Freq: Four times a day (QID) | ORAL | Status: DC
  Administered 2018-02-18 – 2018-02-21 (×12): 20 mg via ORAL
  Filled 2018-02-18 (×12): qty 2

## 2018-02-18 NOTE — Evaluation (Signed)
Occupational Therapy Evaluation Patient Details Name: Ashley [name] MRN: [id number] DOB: 1976-08-20 Today's Date: 02/18/2018    History of Present Illness Pt is a 42 y/o female s/p Anterolateral decompression L3-4 and L4-5 with indirect decompression of L3-4 spacers spacer into L3-4 and L4-5. Percutaneous fixation L3 to the sacrum. 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   Clinical Impression   PTA Pt independent in ADL and mobility (SPC PRN) - has a 43 year old that she cares for. Pt is currently max A to total A for LB ADL max A +2 for sit <> stand, significant pain in RLE that requires blocking during transfers. Pt has supportive adult son who lives with her and is available 24/7 for assist. Pt will benefit from CIR level therapy to maximize safety and independence in ADL and functional transfers. OT will follow acutely. Next session to focus on transfers and also intro to AE for LB ADL.    Follow Up Recommendations  CIR;Supervision/Assistance - 24 hour    Equipment Recommendations  Other (comment)(defer to next venue)    Recommendations for Other Services Rehab consult     Precautions / Restrictions Precautions Precautions: Back;Fall Precaution Booklet Issued: No Precaution Comments: reviewed verbally with Pt and son Restrictions Weight Bearing Restrictions: No      Mobility Bed Mobility Overal bed mobility: Needs Assistance Bed Mobility: Rolling;Sidelying to Sit Rolling: Mod assist;+2 for safety/equipment(vc for sequencing) Sidelying to sit: Mod assist;+2 for physical assistance;+2 for safety/equipment;HOB elevated       General bed mobility comments: vc for sequencing, assist for trunk elevation, Pt assisting with UE on rail  Transfers Overall transfer level: Needs  assistance Equipment used: Rolling walker (2 wheeled) Transfers: Sit to/from [employer] Transfers Sit to Stand: Max assist;+2 physical assistance;+2 safety/equipment;From elevated surface(blocking out RLE; bed at very high elevation) Stand pivot transfers: Min assist;+2 physical assistance;+2 safety/equipment;From elevated surface(assist to progress RLE, vc for sequencing)       General transfer comment: vc for safety/sequencing.     Balance Overall balance assessment: Needs assistance Sitting-balance support: Bilateral upper extremity supported;Feet supported Sitting balance-Leahy Scale: Good     Standing balance support: Bilateral upper extremity supported;During functional activity Standing balance-Leahy Scale: Poor                             ADL either performed or assessed with clinical judgement   ADL Overall ADL's : Needs assistance/impaired Eating/Feeding: Set up;Sitting   Grooming: Wash/dry face;Set up;Sitting   Upper Body Bathing: Maximal assistance;Sitting   Lower Body Bathing: Maximal assistance   Upper Body Dressing : Maximal assistance Upper Body Dressing Details (indicate cue type and reason): to don hospital gown Lower Body Dressing: Maximal assistance;+2 for safety/equipment   Toilet Transfer: Moderate assistance;+2 for physical assistance;+2 for safety/equipment;RW Toilet Transfer Details (indicate cue type and reason): simulated through recliner transfer [place] and Hygiene: Total assistance       Functional mobility during ADLs: +2 for physical assistance;+2 for safety/equipment;Moderate assistance;Rolling walker(SPT only)       Vision Baseline Vision/History: Wears glasses Wears Glasses: At all times Patient Visual Report: No change from baseline       Perception     Praxis      Pertinent Vitals/Pain Pain Assessment: 0-10 Pain Score: 4  Pain Location:  back and left leg Pain Descriptors /  Indicators: Burning;Constant Pain Intervention(s): Monitored during session     Hand Dominance Right   Extremity/Trunk Assessment Upper Extremity Assessment Upper Extremity Assessment: Generalized weakness   Lower Extremity Assessment Lower Extremity Assessment: (P) RLE deficits/detail RLE Deficits / Details: (P) assymetrical weakness, noted 1-2 /5 upon testing various motions. Active contraction noted. Pt reports muscle spasms present RLE Coordination: (P) decreased fine motor;decreased gross motor   Cervical / Trunk Assessment Cervical / Trunk Assessment: Other exceptions Cervical / Trunk Exceptions: s/p back sx   Communication Communication Communication: No difficulties   Cognition Arousal/Alertness: Awake/alert Behavior During Therapy: WFL for tasks assessed/performed;Flat affect(upset about RLE) Overall Cognitive Status: Within Functional Limits for tasks assessed                                     General Comments  Son present throughout session    Exercises     Shoulder Instructions      Home Living Family/patient expects to be discharged to:: Private residence Living Arrangements: Children Available Help at Discharge: Family;Available 24 hours/day Type of Home: House Home Access: Level entry     Home Layout: One level     Bathroom Shower/Tub: Teacher, early years/pre: Handicapped height Bathroom Accessibility: Yes   Home Equipment: Environmental consultant - 2 wheels;Cane - single point;Hand held shower head;Shower seat   Additional Comments: also has a 43 year old that relies on her      Prior Functioning/Environment Level of Independence: Independent                 OT Problem List: Decreased strength;Decreased range of motion;Impaired balance (sitting and/or standing);Decreased activity tolerance;Decreased safety awareness;Decreased knowledge of use of DME or AE;Decreased knowledge of precautions;Obesity;Pain      OT  Treatment/Interventions: Self-care/ADL training;Energy conservation;DME and/or AE instruction;Therapeutic activities;Patient/family education;Balance training    OT Goals(Current goals can be found in the care plan section) Acute Rehab OT Goals Patient Stated Goal: get RLE working again - back to independent OT Goal Formulation: With patient/family Time For Goal Achievement: 03/04/18 Potential to Achieve Goals: Good ADL Goals Pt Will Perform Grooming: with modified independence;sitting Pt Will Perform Lower Body Bathing: with modified independence;with adaptive equipment;sit to/from stand Pt Will Perform Upper Body Dressing: with modified independence;sitting Pt Will Perform Lower Body Dressing: with min guard assist;sit to/from stand;with adaptive equipment Pt Will Transfer to Toilet: with supervision;stand pivot transfer;bedside commode Pt Will Perform Toileting - Clothing Manipulation and hygiene: with modified independence;with adaptive equipment;sit to/from stand Additional ADL Goal #1: Pt will perform bed mobility at mod I level prior to engaging in ADL activity  OT Frequency: Min 3X/week   Barriers to D/C:            Co-evaluation PT/OT/SLP Co-Evaluation/Treatment: Yes Reason for Co-Treatment: For patient/therapist safety;To address functional/ADL transfers PT goals addressed during session: Mobility/safety with mobility;Balance;Proper use of DME OT goals addressed during session: ADL's and self-care;Proper use of Adaptive equipment and DME      AM-PAC PT "6 Clicks" Daily Activity     Outcome Measure Help from another person eating meals?: None Help from another person taking care of personal grooming?: A Little Help from another person toileting, which includes using toliet, bedpan, or urinal?: A Lot Help from another person bathing (including washing, rinsing, drying)?: A Lot Help from another person to put on and taking off regular upper  body clothing?: A Lot Help from  another person to put on and taking off regular lower body clothing?: Total 6 Click Score: 14   End of Session Equipment Utilized During Treatment: Gait belt;Rolling walker Nurse Communication: Mobility status;Patient requests pain meds;Precautions  Activity Tolerance: Patient tolerated treatment well Patient left: in chair;with call bell/phone within reach;with family/visitor present  OT Visit Diagnosis: Unsteadiness on feet (R26.81);Other abnormalities of gait and mobility (R26.89);Muscle weakness (generalized) (M62.81);Pain Pain - Right/Left: Right Pain - part of body: Leg                Time: 1100-1125 OT Time Calculation (min): 25 min Charges:  OT General Charges $OT Visit: 1 Visit OT Evaluation $OT Eval Moderate Complexity: 1 Mod G-Codes:     Hulda Humphrey OTR/L [place] 02/18/2018, 12:37 PM

## 2018-02-18 NOTE — Progress Notes (Signed)
   VASCULAR SURGERY ASSESSMENT & PLAN:   1 Day Post-Op s/p: Anterior retroperitoneal exposure of L5-S1.  Doing well.  Vascular surgery will be available as needed.  SUBJECTIVE:   Pain adequately controlled.  PHYSICAL EXAM:   Vitals:   02/17/18 1930 02/17/18 2034 02/17/18 2339 02/18/18 0326  BP:  (!) 147/86 138/61 129/64  Pulse:  72 79 87  Resp:  16 16 18   Temp: (!) 97.2 F (36.2 C) 97.6 F (36.4 C) 98.1 F (36.7 C) 98.1 F (36.7 C)  TempSrc:  Oral Oral Oral  SpO2:  100% 100% 99%   Her left lower quadrant incision looks fine. Left foot is warm and well-perfused.  LABS:   Lab Results  Component Value Date   WBC 7.3 02/18/2018   HGB 9.4 (L) 02/18/2018   HCT 30.4 (L) 02/18/2018   MCV 80.9 02/18/2018   PLT 225 02/18/2018   Lab Results  Component Value Date   CREATININE 0.83 02/18/2018    PROBLEM LIST:    Active Problems:   Status post surgery   Lumbar radiculopathy, chronic   CURRENT MEDS:   . buPROPion  300 mg Oral Daily  . docusate sodium  100 mg Oral BID  . gabapentin  600 mg Oral QID  . losartan  50 mg Oral Daily   And  . hydrochlorothiazide  12.5 mg Oral Daily  . ketorolac  15 mg Intravenous Q6H  . loratadine  10 mg Oral Daily  . senna  1 tablet Oral BID  . sertraline  50 mg Oral Daily  . sodium chloride flush  3 mL Intravenous Q12H    Deitra Mayo Beeper: [phone] Office: [phone] 02/18/2018

## 2018-02-18 NOTE — Evaluation (Signed)
Physical Therapy Evaluation Patient Details Name: Ashley [name] MRN: [id number] DOB: [birth year] Today's Date: 02/18/2018   History of Present Illness  Pt is a 42 y/o female s/p Anterolateral decompression L3-4 and L4-5 with indirect decompression of L3-4 spacers spacer into L3-4 and L4-5. Percutaneous fixation L3 to the sacrum. 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  Clinical Impression  Orders received for PT evaluation. Patient demonstrates deficits in functional mobility as indicated below. Will benefit from continued skilled PT to address deficits and maximize function. Will see as indicated and progress as tolerated.  Prior to admission patient was independent (ocassional use of cane) and caring for her 4 yr old daughter. Patient now currently with no functional use of RLE (some trace to limited active movement) and decreased overall mobility. Given prior level of function, patient desire to return to baseline, and immense family support (4 members in room during session and 2 sons available to help); feel patient would benefit tremendously from comprehensive intense therapies. Recommend CIR.    Follow Up Recommendations CIR    Equipment Recommendations  (TBD)    Recommendations for Other Services Rehab consult     Precautions / Restrictions Precautions Precautions: Back;Fall Precaution Booklet Issued: No Precaution Comments: reviewed verbally with Pt and son Restrictions Weight Bearing Restrictions: No      Mobility  Bed Mobility Overal bed mobility: Needs Assistance Bed Mobility: Rolling;Sidelying to Sit Rolling: Mod assist;+2 for safety/equipment(vc for sequencing) Sidelying to sit: Mod assist;+2 for physical assistance;+2 for safety/equipment;HOB elevated       General bed mobility  comments: vc for sequencing, assist for trunk elevation, Pt assisting with UE on rail  Transfers Overall transfer level: Needs assistance Equipment used: Rolling walker (2 wheeled) Transfers: Sit to/from [employer] Transfers Sit to Stand: Max assist;+2 physical assistance;+2 safety/equipment;From elevated surface(blocking out RLE; bed at very high elevation) Stand pivot transfers: Min assist;+2 physical assistance;+2 safety/equipment;From elevated surface(assist to progress RLE, vc for sequencing)       General transfer comment: vc for safety/sequencing.   Ambulation/Gait                Stairs            Wheelchair Mobility    Modified Rankin (Stroke Patients Only)       Balance Overall balance assessment: Needs assistance Sitting-balance support: Bilateral upper extremity supported;Feet supported Sitting balance-Leahy Scale: Good     Standing balance support: Bilateral upper extremity supported;During functional activity Standing balance-Leahy Scale: Poor                               Pertinent Vitals/Pain Pain Assessment: 0-10 Pain Score: 4  Pain Location: back and left leg Pain Descriptors / Indicators: Burning;Constant Pain Intervention(s): Monitored during session    Home Living Family/patient expects to be discharged to:: Private residence Living Arrangements: Children Available Help at Discharge: Family;Available 24 hours/day Type of Home: House Home Access: Level entry     Home Layout: One level Home Equipment: Walker - 2 wheels;Cane - single point;Hand held shower head;Shower seat Additional Comments: also has a 43 year old that relies on her    Prior Function Level of Independence: Independent               Hand Dominance   Dominant Hand:  Right    Extremity/Trunk Assessment   Upper Extremity Assessment Upper Extremity Assessment: Generalized weakness    Lower Extremity Assessment Lower Extremity  Assessment: RLE deficits/detail RLE Deficits / Details: assymetrical weakness, noted 1-2 /5 upon testing various motions. Active contraction noted. Pt reports muscle spasms present RLE Coordination: decreased fine motor;decreased gross motor    Cervical / Trunk Assessment Cervical / Trunk Assessment: Other exceptions Cervical / Trunk Exceptions: s/p back sx  Communication   Communication: No difficulties  Cognition Arousal/Alertness: Awake/alert Behavior During Therapy: WFL for tasks assessed/performed;Flat affect(upset about RLE) Overall Cognitive Status: Within Functional Limits for tasks assessed                                        General Comments General comments (skin integrity, edema, etc.): Son present throughout session    Exercises     Assessment/Plan    PT Assessment Patient needs continued PT services  PT Problem List Decreased strength;Decreased activity tolerance;Decreased balance;Decreased mobility;Decreased coordination;Decreased knowledge of use of DME;Pain       PT Treatment Interventions DME instruction;Gait training;Stair training;Functional mobility training;Therapeutic activities;Therapeutic exercise;Balance training;Patient/family education    PT Goals (Current goals can be found in the Care Plan section)  Acute Rehab PT Goals Patient Stated Goal: get RLE working again - back to independent PT Goal Formulation: With patient/family Time For Goal Achievement: 03/04/18 Potential to Achieve Goals: Good    Frequency Min 5X/week   Barriers to discharge        Co-evaluation PT/OT/SLP Co-Evaluation/Treatment: Yes Reason for Co-Treatment: For patient/therapist safety;To address functional/ADL transfers PT goals addressed during session: Mobility/safety with mobility;Balance;Proper use of DME OT goals addressed during session: ADL's and self-care;Proper use of Adaptive equipment and DME       AM-PAC PT "6 Clicks" Daily Activity   Outcome Measure Difficulty turning over in bed (including adjusting bedclothes, sheets and blankets)?: Unable Difficulty moving from lying on back to sitting on the side of the bed? : Unable Difficulty sitting down on and standing up from a chair with arms (e.g., wheelchair, bedside commode, etc,.)?: Unable Help needed moving to and from a bed to chair (including a wheelchair)?: A Lot Help needed walking in hospital room?: A Lot Help needed climbing 3-5 steps with a railing? : Total 6 Click Score: 8    End of Session   Activity Tolerance: Patient tolerated treatment well Patient left: in chair;with call bell/phone within reach;with family/visitor present Nurse Communication: Mobility status PT Visit Diagnosis: Unsteadiness on feet (R26.81);Muscle weakness (generalized) (M62.81);Pain;Difficulty in walking, not elsewhere classified (R26.2) Pain - Right/Left: Right Pain - part of body: Leg    Time: 1100-1125 PT Time Calculation (min) (ACUTE ONLY): 25 min   Charges:   PT Evaluation $PT Eval Moderate Complexity: 1 Mod     PT G Codes:        Alben Deeds, PT DPT  Board Certified Neurologic Specialist [phone]   Duncan Dull 02/18/2018, 12:38 PM

## 2018-02-18 NOTE — Plan of Care (Signed)
  Problem: Education: Goal: Knowledge of General Education information will improve Outcome: Progressing   Problem: Health Behavior/Discharge Planning: Goal: Ability to manage health-related needs will improve Outcome: Progressing   Problem: Coping: Goal: Level of anxiety will decrease Outcome: Progressing   Problem: Pain Managment: Goal: General experience of comfort will improve Outcome: Progressing   Problem: Safety: Goal: Ability to remain free from injury will improve Outcome: Progressing   Problem: Skin Integrity: Goal: Risk for impaired skin integrity will decrease Outcome: Progressing   Problem: Activity: Goal: Ability to avoid complications of mobility impairment will improve Outcome: Progressing   Problem: Bowel/Gastric: Goal: Gastrointestinal status for postoperative course will improve Outcome: Progressing   Problem: Education: Goal: Ability to verbalize activity precautions or restrictions will improve Outcome: Progressing Goal: Knowledge of the prescribed therapeutic regimen will improve Outcome: Progressing Goal: Understanding of discharge needs will improve Outcome: Progressing   Problem: Pain Management: Goal: Pain level will decrease Outcome: Progressing

## 2018-02-18 NOTE — Progress Notes (Signed)
Patient ID: Ashley [name], female   DOB: [birth year], 42 y.o.   MRN: [id number] Vital signs are stable Patient having considerable weakness in right iliopsoas and quad complex PT notices 1 out of 5 strength Is likely from irritability in the L3 nerve distribution Will plan consult with CIR Add baclofen 20 mg 4 times daily for spasms

## 2018-02-18 NOTE — Consult Note (Signed)
Physical Medicine and Rehabilitation Consult Reason for Consult: Right iliopsoas weakness Referring Physician: Kristeen Miss, MD   HPI: Ashley [name] is a 42 y.o. female with past medical history/past medical history of PCOS, lumbar spondylosis, hypertension,  morbid obesity, depression, arthritis, bilateral TKA right THA, right shoulder surgery, bilateral carpal tunnel release, ankle surgery, back surgery presented on 5/31 for back surgery. Patient had a history of disc herniation and underwent discectomy. She became progressively worse and repeat imaging suggested degenerative changes in L3-5. She underwent L3 to sacrum decompression and fixation. She was  Later taken back for anterior lumbar interbody arthrodesis  From L5-S1and lateral decompression L3-5 and posterior  Fixation L3 to sacrum. Hospital course complicated by postoperative pain in acute blood loss anemia.   Review of Systems  Musculoskeletal: Positive for back pain.  Neurological: Positive for sensory change and focal weakness.  All other systems reviewed and are negative.  Past Medical History:  Diagnosis Date  . Arthritis    knees, back  . 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 Complication of anesthesia   . Depression   . History of pneumonia   . Hypertension   . Lumbar spondylosis   . PCOS (polycystic ovarian syndrome)   . Pneumonia    2016  . PONV (postoperative nausea and vomiting)    Past Surgical History:  Procedure Laterality Date  . ANKLE SURGERY     x 2  . BACK SURGERY     2017  discectomy  . CARPAL TUNNEL RELEASE Left 07/04/2014   Procedure: LEFT CARPAL TUNNEL RELEASE;  Surgeon: Ninetta Lights, MD;  Location: [facility];  Service: Orthopedics;  Laterality: Left;  . CARPAL TUNNEL RELEASE Right 08/08/2014   Procedure: RIGHT CARPAL TUNNEL RELEASE;  Surgeon: Ninetta Lights, MD;  Location: [facility];  Service: Orthopedics;  Laterality: Right;  .  DILATION AND CURETTAGE OF UTERUS    . DILATION AND EVACUATION  04/02/2011   Procedure: DILATATION AND EVACUATION (D&E);  Surgeon: Luz Lex, MD;  Location: [facility];  Service: Gynecology;  Laterality: N/A;  dvt left mid thigh  . DORSAL COMPARTMENT RELEASE Left 07/04/2014   Procedure: LEFT DEQUERVAINS;  Surgeon: Ninetta Lights, MD;  Location: [facility];  Service: Orthopedics;  Laterality: Left;  . ENDOSCOPIC PLANTAR FASCIOTOMY Left 02/01/2002  . HYSTEROSCOPY W/D&C  07/17/2010   with exc. endometrial polyps  . KNEE ARTHROSCOPY Right 03/07/2003; 01/14/2005; 08/31/2006  . KNEE ARTHROSCOPY Left 03/21/2003; 11/26/2004  . SHOULDER ARTHROSCOPY Right   . TOE SURGERY Left 01/10/2003   claw toe correction 2nd, 3rd, 4th toes  . TOTAL HIP ARTHROPLASTY Right 04/21/2016  . TOTAL HIP ARTHROPLASTY Right 04/21/2016   Procedure: TOTAL HIP ARTHROPLASTY ANTERIOR APPROACH;  Surgeon: Ninetta Lights, MD;  Location: [facility];  Service: Orthopedics;  Laterality: Right;  . TOTAL KNEE ARTHROPLASTY Left 01/21/2010  . TOTAL KNEE ARTHROPLASTY Right 08/26/2008   Family History  Problem Relation Age of Onset  . Cancer Maternal Grandmother        UTERINE  . Cancer Paternal Grandmother        breast  . Diabetes Father   . Cancer Father        COLON  . Heart disease Father   . Kidney disease Father        dialysis  . Diabetes Sister    Social History:  reports that she has never smoked. She has never used  smokeless tobacco. She reports that she does not drink alcohol or use drugs. Allergies:  Allergies  Allergen Reactions  . Gadolinium Derivatives Nausea And Vomiting    Pt describes this happens every time she gets gado even with slow injection  . Codeine Itching and Nausea Only  . Cymbalta [Duloxetine Hcl] Other (See Comments)    Altered mental status   Medications Prior to Admission  Medication Sig Dispense Refill  . ALPRAZolam (XANAX) 1 MG tablet Take 1 mg by mouth daily as needed for anxiety.    .  baclofen (LIORESAL) 10 MG tablet Take 10 mg by mouth 3 (three) times daily as needed for muscle spasms.     Marland Kitchen buPROPion (WELLBUTRIN XL) 300 MG 24 hr tablet Take 300 mg daily by mouth.    . cetirizine (ZYRTEC) 10 MG tablet Take 10 mg by mouth as needed.     . docusate sodium (COLACE) 100 MG capsule Take 100 mg by mouth 2 (two) times daily as needed for mild constipation.    Marland Kitchen EPINEPHrine 0.3 mg/0.3 mL IJ SOAJ injection Inject 0.3 mg into the muscle as needed (allergic reaction).   0  . furosemide (LASIX) 20 MG tablet Take 20 mg by mouth daily as needed for fluid.    Marland Kitchen gabapentin (NEURONTIN) 300 MG capsule Take 600 mg by mouth 4 (four) times daily.     Marland Kitchen ibuprofen (ADVIL,MOTRIN) 600 MG tablet Take 600 mg by mouth 2 (two) times daily as needed for pain.  2  . Lorcaserin HCl ER (BELVIQ XR) 20 MG TB24 Take 20 mg by mouth daily.    Marland Kitchen losartan-hydrochlorothiazide (HYZAAR) 50-12.5 MG tablet Take 1 tablet by mouth daily. 90 tablet 3  . methocarbamol (ROBAXIN) 500 MG tablet Take 500 mg by mouth every 6 (six) hours as needed for muscle spasms.    . Multiple Vitamins-Minerals (MULTIVITAMIN WITH MINERALS) tablet Take 1 tablet daily by mouth.    . nabumetone (RELAFEN) 500 MG tablet Take 500 mg 2 (two) times daily by mouth.    . oxyCODONE-acetaminophen (PERCOCET) 10-325 MG tablet Take 1 tablet every 6 (six) hours as needed by mouth for pain.    Marland Kitchen sertraline (ZOLOFT) 50 MG tablet Take 50-100 mg by mouth See admin instructions. Take 50 mg once daily for 7 days then take 100 mg once daily      Home: Home Living Family/patient expects to be discharged to:: Private residence Living Arrangements: Children Available Help at Discharge: Family, Available 24 hours/day Type of Home: House Home Access: Level entry [place]: One level Bathroom Shower/Tub: Chiropodist: Handicapped height Bathroom Accessibility: Yes Home Equipment: Environmental consultant - 2 wheels, Cane - single point, Hand held shower head,  Shower seat Additional Comments: also has a 43 year old that relies on her  Functional History: Prior Function Level of Independence: Independent Functional Status:  Mobility: Bed Mobility Overal bed mobility: Needs Assistance Bed Mobility: Rolling, Sidelying to Sit Rolling: Mod assist, +2 for safety/equipment(vc for sequencing) Sidelying to sit: Mod assist, +2 for physical assistance, +2 for safety/equipment, HOB elevated General bed mobility comments: vc for sequencing, assist for trunk elevation, Pt assisting with UE on rail Transfers Overall transfer level: Needs assistance Equipment used: Rolling walker (2 wheeled) Transfers: Sit to/from Stand, Stand Pivot Transfers Sit to Stand: Max assist, +2 physical assistance, +2 safety/equipment, From elevated surface(blocking out RLE; bed at very high elevation) Stand pivot transfers: Min assist, +2 physical assistance, +2 safety/equipment, From elevated surface(assist to progress RLE,  vc for sequencing) General transfer comment: vc for safety/sequencing.       ADL: ADL Overall ADL's : Needs assistance/impaired Eating/Feeding: Set up, Sitting Grooming: Wash/dry face, Set up, Sitting Upper Body Bathing: Maximal assistance, Sitting Lower Body Bathing: Maximal assistance Upper Body Dressing : Maximal assistance Upper Body Dressing Details (indicate cue type and reason): to don hospital gown Lower Body Dressing: Maximal assistance, +2 for safety/equipment Toilet Transfer: Moderate assistance, +2 for physical assistance, +2 for safety/equipment, RW Toilet Transfer Details (indicate cue type and reason): simulated through recliner transfer [place] and Hygiene: Total assistance Functional mobility during ADLs: +2 for physical assistance, +2 for safety/equipment, Moderate assistance, Rolling walker(SPT only)  Cognition: Cognition Overall Cognitive Status: Within Functional Limits for tasks assessed Orientation  Level: Oriented X4 Cognition Arousal/Alertness: Awake/alert Behavior During Therapy: WFL for tasks assessed/performed, Flat affect(upset about RLE) Overall Cognitive Status: Within Functional Limits for tasks assessed  Blood pressure (!) 103/59, pulse 93, temperature 97.7 F (36.5 C), temperature source Oral, resp. rate 16, last menstrual period 01/28/2018, SpO2 99 %. Physical Exam  Constitutional: She is oriented to person, place, and time. She appears well-developed.  Obese Mildly distressed  HENT:  Head: Normocephalic and atraumatic.  Eyes: EOM are normal. Right eye exhibits no discharge. Left eye exhibits no discharge.  Neck: Normal range of motion. Neck supple.  Cardiovascular: Normal rate and regular rhythm.  Respiratory: Effort normal and breath sounds normal.  GI: Soft. Bowel sounds are normal.  Musculoskeletal:  No edema or tenderness in extremities  Neurological: She is alert and oriented to person, place, and time.  Motor: Bilateral upper extremity: 5/5 proximal distal Left lower extremity: 4/5 proximal distal Right lower extremity: Flexion, knee extension 0/5, ankle dorsiflexion 4+/5 Sensation diminished to light touch in right thigh  Skin: Skin is warm and dry.  Psychiatric: She has a normal mood and affect. Her behavior is normal.    Results for orders placed or performed during the hospital encounter of 02/17/18 (from the past 24 hour(s))  CBC     Status: Abnormal   Collection Time: 02/18/18  3:13 AM  Result Value Ref Range   WBC 7.3 4.0 - 10.5 K/uL   RBC 3.76 (L) 3.87 - 5.11 MIL/uL   Hemoglobin 9.4 (L) 12.0 - 15.0 g/dL   HCT 30.4 (L) 36.0 - 46.0 %   MCV 80.9 78.0 - 100.0 fL   MCH 25.0 (L) 26.0 - 34.0 pg   MCHC 30.9 30.0 - 36.0 g/dL   RDW 15.3 11.5 - 15.5 %   Platelets 225 150 - 400 K/uL  Basic Metabolic Panel     Status: Abnormal   Collection Time: 02/18/18  3:13 AM  Result Value Ref Range   Sodium 137 135 - 145 mmol/L   Potassium 3.8 3.5 - 5.1 mmol/L    Chloride 104 101 - 111 mmol/L   CO2 27 22 - 32 mmol/L   Glucose, Bld 134 (H) 65 - 99 mg/dL   BUN 7 6 - 20 mg/dL   Creatinine, Ser 0.83 0.44 - 1.00 mg/dL   Calcium 8.1 (L) 8.9 - 10.3 mg/dL   GFR calc non Af Amer >60 >60 mL/min   GFR calc Af Amer >60 >60 mL/min   Anion gap 6 5 - 15   Dg Lumbar Spine 2-3 Views  Result Date: 02/17/2018 CLINICAL DATA:  L5-S1 anterior lumbar interbody fusion.  Surgery. FLUOROSCOPY TIME:  3 minutes and 16 seconds. Images: 30 EXAM: LUMBAR SPINE - 2-3 VIEW; DG  C-ARM 61-120 MIN COMPARISON:  None. FINDINGS: An IUD is seen in the pelvis. An L5-S1 interbody fusion is performed during the study. A disc spacer device is placed at L4-L5 as well. A disc spacer device is placed at L3-4 as well. Pedicle rods and screws are placed at L3, L4, and L5. No other abnormalities. IMPRESSION: Surgical changes as above. Electronically Signed   By: Dorise Bullion III M.D   On: 02/17/2018 17:45   Dg C-arm 1-60 Min  Result Date: 02/17/2018 CLINICAL DATA:  L5-S1 anterior lumbar interbody fusion.  Surgery. FLUOROSCOPY TIME:  3 minutes and 16 seconds. Images: 30 EXAM: LUMBAR SPINE - 2-3 VIEW; DG C-ARM 61-120 MIN COMPARISON:  None. FINDINGS: An IUD is seen in the pelvis. An L5-S1 interbody fusion is performed during the study. A disc spacer device is placed at L4-L5 as well. A disc spacer device is placed at L3-4 as well. Pedicle rods and screws are placed at L3, L4, and L5. No other abnormalities. IMPRESSION: Surgical changes as above. Electronically Signed   By: Dorise Bullion III M.D   On: 02/17/2018 17:45   Dg C-arm 1-60 Min  Result Date: 02/17/2018 CLINICAL DATA:  L5-S1 anterior lumbar interbody fusion.  Surgery. FLUOROSCOPY TIME:  3 minutes and 16 seconds. Images: 30 EXAM: LUMBAR SPINE - 2-3 VIEW; DG C-ARM 61-120 MIN COMPARISON:  None. FINDINGS: An IUD is seen in the pelvis. An L5-S1 interbody fusion is performed during the study. A disc spacer device is placed at L4-L5 as well. A disc  spacer device is placed at L3-4 as well. Pedicle rods and screws are placed at L3, L4, and L5. No other abnormalities. IMPRESSION: Surgical changes as above. Electronically Signed   By: Dorise Bullion III M.D   On: 02/17/2018 17:45   Dg C-arm 1-60 Min  Result Date: 02/17/2018 CLINICAL DATA:  L5-S1 anterior lumbar interbody fusion.  Surgery. FLUOROSCOPY TIME:  3 minutes and 16 seconds. Images: 30 EXAM: LUMBAR SPINE - 2-3 VIEW; DG C-ARM 61-120 MIN COMPARISON:  None. FINDINGS: An IUD is seen in the pelvis. An L5-S1 interbody fusion is performed during the study. A disc spacer device is placed at L4-L5 as well. A disc spacer device is placed at L3-4 as well. Pedicle rods and screws are placed at L3, L4, and L5. No other abnormalities. IMPRESSION: Surgical changes as above. Electronically Signed   By: Dorise Bullion III M.D   On: 02/17/2018 17:45   Dg Or Local Abdomen  Result Date: 02/17/2018 CLINICAL DATA:  L5-S1 interbody fusion with an anterior abdominal approach. Routine evaluation for instrument count. EXAM: OR LOCAL ABDOMEN COMPARISON:  Lumbar spine CT dated 11/30/2017. FINDINGS: Interval L5-S1 interbody hardware. Intrauterine device in expected position. Right hip prosthesis. No radiopaque surgical instruments are seen. Lower lumbar spine degenerative changes. IMPRESSION: No radiopaque surgical instruments. These results were called by telephone at the time of interpretation on 02/17/2018 at 10:20 am to the nurse in the operating room, who verbally acknowledged these results. Electronically Signed   By: Claudie Revering M.D.   On: 02/17/2018 10:21    Assessment/Plan: Diagnosis: right lower extremity weakness Labs independently reviewed.  Records reviewed and summated above.  1. Does the need for close, 24 hr/day medical supervision in concert with the patient's rehab needs make it unreasonable for this patient to be served in a less intensive setting? Yes  2. Co-Morbidities requiring  supervision/potential complications: PCOS, lumbar spondylosis, HTN (monitor and provide prns in accordance with increased physical exertion  and pain), morbid obesity (encourage weight loss), depression (ensure mood does not hinder progress of therapies), OA (ensure pain does not limit therapies), postoperative pain (Biofeedback training with therapies to help reduce reliance on opiate pain medications, particularly IV morphine, monitor pain control during therapies, and sedation at rest and titrate to maximum efficacy to ensure participation and gains in therapies), acute blood loss anemia (transfuse if necessary to ensure appropriate perfusion for increased activity tolerance) 3. Due to safety, skin/wound care, disease management, pain management and patient education, does the patient require 24 hr/day rehab nursing? Yes 4. Does the patient require coordinated care of a physician, rehab nurse, PT (1-2 hrs/day, 5 days/week) and OT (1-2 hrs/day, 5 days/week) to address physical and functional deficits in the context of the above medical diagnosis(es)? Yes Addressing deficits in the following areas: balance, endurance, locomotion, strength, transferring, bathing, dressing, toileting and psychosocial support 5. Can the patient actively participate in an intensive therapy program of at least 3 hrs of therapy per day at least 5 days per week? Yes 6. The potential for patient to make measurable gains while on inpatient rehab is excellent 7. Anticipated functional outcomes upon discharge from inpatient rehab are min assist  with PT, min assist with OT, n/a with SLP. 8. Estimated rehab length of stay to reach the above functional goals is: 12-16 days. 9. Anticipated D/C setting: Home 10. Anticipated post D/C treatments: HH therapy and Home excercise program 11. Overall Rehab/Functional Prognosis: good  RECOMMENDATIONS: This patient's condition is appropriate for continued rehabilitative care in the following  setting: CIR Patient has agreed to participate in recommended program. Yes Note that insurance prior authorization may be required for reimbursement for recommended care.  Comment: Rehab Admissions Coordinator to follow up.  Delice Lesch, MD, ABPMR 02/18/2018

## 2018-02-18 NOTE — Progress Notes (Signed)
Patient ID: Ashley [name], female   DOB: Oct 19, 1975, 42 y.o.   MRN: [id number] BP 114/65 (BP Location: Right Arm)   Pulse 84   Temp 98 F (36.7 C) (Oral)   Resp 18   LMP 01/28/2018   SpO2 100%  Alert and oriented x 4 Still weak in the hip flexors on the right Dorsiflexors,and plantar flexors working just fine Wounds are clean, dry, without signs of infection Slow improvement

## 2018-02-19 NOTE — Anesthesia Postprocedure Evaluation (Signed)
Anesthesia Post Note  Patient: Ashley [name]  Procedure(s) Performed: Lumbar five-Sacral one Anterior lumbar interbody fusion (N/A Abdomen) Lumbar three-four Lumbar four-five  Anterolateral lumbar interbody fusion (N/A Flank) APPLICATION OF ROBOTIC ASSISTANCE FOR SPINAL PROCEDURE (N/A ) LUMBAR PERCUTANEOUS PEDICLE SCREW PLACEMENT LUMBAR THREE-SACRAL ONE (N/A Back) ABDOMINAL EXPOSURE (N/A )     Patient location during evaluation: PACU Anesthesia Type: General Level of consciousness: awake and alert Pain management: pain level controlled Vital Signs Assessment: post-procedure vital signs reviewed and stable Respiratory status: spontaneous breathing, nonlabored ventilation, respiratory function stable and patient connected to nasal cannula oxygen Cardiovascular status: blood pressure returned to baseline and stable Postop Assessment: no apparent nausea or vomiting Anesthetic complications: no    Last Vitals:  Vitals:   02/18/18 1958 02/19/18 0010  BP: (!) 103/59 117/69  Pulse: 93 87  Resp: 16 18  Temp: 36.5 C 36.8 C  SpO2: 99% 100%    Last Pain:  Vitals:   02/19/18 0010  TempSrc: Oral  PainSc:                  Ashley [name]

## 2018-02-19 NOTE — Progress Notes (Signed)
Vitals:   02/19/18 0010 02/19/18 0407 02/19/18 0810 02/19/18 1222  BP: 117/69 123/77 136/79 (!) 141/73  Pulse: 87 83 93 98  Resp: 18 18 16 18   Temp: 98.3 F (36.8 C) 98 F (36.7 C) 98.6 F (37 C) 98.4 F (36.9 C)  TempSrc: Oral Axillary  Oral  SpO2: 100% 100% 100% 100%    CBC Recent Labs    02/17/18 1610 02/18/18 0313  WBC  --  7.3  HGB 10.9* 9.4*  HCT 32.0* 30.4*  PLT  --  225   BMET Recent Labs    02/17/18 1610 02/18/18 0313  NA 138 137  K 3.9 3.8  CL  --  104  CO2  --  27  GLUCOSE  --  134*  BUN  --  7  CREATININE  --  0.83  CALCIUM  --  8.1*    Patient resting in bed.  Progress has been very gradual.  Continue to work with PT and OT.  Seen by Dr. Posey Pronto from physical medicine rehabilitation, and PT, OT, and Dr. Posey Pronto all feel the patient is a good candidate for CIR.  Plan: Continue to progress through postoperative recovery.  Hopefully transfer to CIR this coming week.  Hosie Spangle, MD 02/19/2018, 12:55 PM

## 2018-02-19 NOTE — Plan of Care (Signed)
Progressing daily

## 2018-02-19 NOTE — Progress Notes (Signed)
Physical Therapy Treatment Patient Details Name: Ashley [name] MRN: [id number] DOB: [birth year] Today's Date: 02/19/2018    History of Present Illness Pt is a 42 y/o female s/p Anterolateral decompression L3-4 and L4-5 with indirect decompression of L3-4 spacers spacer into L3-4 and L4-5. Percutaneous fixation L3 to the sacrum. 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    PT Comments    Patient eager to progress. Worked hard of transfer training, exercise for RLE and Pre gait/gait activities. Patient was able to progress to 6 steps of ambulation with assist and RW. Some + gains in RLE activation and strength. Pain remains limiting factor. Will continue to see and progress as tolerated. Large family support at bedside. Continue to feel patient is an ideal candidate for CIR.   Follow Up Recommendations  CIR     Equipment Recommendations  (TBD)    Recommendations for Other Services Rehab consult     Precautions / Restrictions Precautions Precautions: Back;Fall Precaution Booklet Issued: No Precaution Comments: reviewed verbally with Pt and son Restrictions Weight Bearing Restrictions: No    Mobility  Bed Mobility Overal bed mobility: Needs Assistance Bed Mobility: Rolling;Sidelying to Sit Rolling: Mod assist;+2 for safety/equipment(vc for sequencing) Sidelying to sit: Mod assist       General bed mobility comments: vc for sequencing, assist for trunk elevation, Pt assisting with UE on rail  Transfers Overall transfer level: Needs assistance Equipment used: Rolling walker (2 wheeled) Transfers: Sit to/from Stand;Stand Pivot Transfers Sit to Stand: +2 physical assistance;+2 safety/equipment;From elevated surface;Mod assist(blocking out RLE; bed at very high elevation) Stand pivot transfers:  (assist to progress RLE, vc for sequencing)       General transfer comment: vc for safety/sequencing. Moderate assist for stability when powering up with cues for engagement of RLE  Ambulation/Gait Ambulation/Gait assistance: Mod assist Ambulation Distance (Feet): 6 Feet Assistive device: Rolling walker (2 wheeled) Gait Pattern/deviations: Step-to pattern Gait velocity: decreased Gait velocity interpretation: <1.31 ft/sec, indicative of household ambulator General Gait Details: VCs for sequencing with RW, multi modal cues to facilitate quad setting during [place] RLE and manual assist for advancement of RLE   Stairs             Wheelchair Mobility    Modified Rankin (Stroke Patients Only)       Balance Overall balance assessment: Needs assistance Sitting-balance support: Bilateral upper extremity supported;Feet supported Sitting balance-Leahy Scale: Good     Standing balance support: Bilateral upper extremity supported;During functional activity Standing balance-Leahy Scale: Poor Standing balance comment: heavy reliance on UE support in standing                            Cognition Arousal/Alertness: Awake/alert Behavior During Therapy: WFL for tasks assessed/performed;Flat affect(upset about RLE) Overall Cognitive Status: Within Functional Limits for tasks assessed                                        Exercises Other Exercises Other Exercises: Performed standing weight shifts bilaterally x5 Other Exercises: AAROM knee flexion RLE with resisted extension X5 Other Exercises: Ankle pumps RLE x10    General Comments        Pertinent Vitals/Pain Pain Assessment: Faces Faces Pain Scale: Hurts  even more Pain Location: back and left leg Pain Descriptors / Indicators: Burning;Constant Pain Intervention(s): Monitored during session    Home Living                      Prior Function            PT Goals (current goals  can now be found in the care plan section) Acute Rehab PT Goals Patient Stated Goal: get RLE working again - back to independent PT Goal Formulation: With patient/family Time For Goal Achievement: 03/04/18 Potential to Achieve Goals: Good Progress towards PT goals: Progressing toward goals    Frequency    Min 5X/week      PT Plan      Co-evaluation              AM-PAC PT "6 Clicks" Daily Activity  Outcome Measure  Difficulty turning over in bed (including adjusting bedclothes, sheets and blankets)?: Unable Difficulty moving from lying on back to sitting on the side of the bed? : Unable Difficulty sitting down on and standing up from a chair with arms (e.g., wheelchair, bedside commode, etc,.)?: Unable Help needed moving to and from a bed to chair (including a wheelchair)?: A Lot Help needed walking in hospital room?: A Lot Help needed climbing 3-5 steps with a railing? : Total 6 Click Score: 8    End of Session   Activity Tolerance: Patient tolerated treatment well Patient left: in chair;with call bell/phone within reach;with family/visitor present Nurse Communication: Mobility status PT Visit Diagnosis: Unsteadiness on feet (R26.81);Muscle weakness (generalized) (M62.81);Pain;Difficulty in walking, not elsewhere classified (R26.2) Pain - Right/Left: Right Pain - part of body: Leg     Time: 7026-3785 PT Time Calculation (min) (ACUTE ONLY): 26 min  Charges:  $Therapeutic Activity: 23-37 mins                    G Codes:       Alben Deeds, PT DPT  Board Certified Neurologic Specialist [phone]    Duncan Dull 02/19/2018, 1:42 PM

## 2018-02-20 NOTE — Progress Notes (Signed)
Physical Therapy Treatment Patient Details Name: Ashley [name] MRN: [id number] DOB: Jan 08, 1976 Today's Date: 02/20/2018    History of Present Illness Pt is a 42 y/o female s/p Anterolateral decompression L3-4 and L4-5 with indirect decompression of L3-4 spacers spacer into L3-4 and L4-5. Percutaneous fixation L3 to the sacrum. 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    PT Comments    Patient continues to make steady progress towards PT goals. Continues to required physical assist for functional mobility and self care. Some improvements noted in RLE strength but continues to show heavy reliance on UE support and cues. Continue to remain appropriate for CIR.    Follow Up Recommendations  CIR     Equipment Recommendations  (TBD)    Recommendations for Other Services Rehab consult     Precautions / Restrictions Precautions Precautions: Back;Fall Precaution Booklet Issued: No Precaution Comments: reviewed verbally with Pt and son Restrictions Weight Bearing Restrictions: No    Mobility  Bed Mobility               General bed mobility comments: received on BSC  Transfers Overall transfer level: Needs assistance Equipment used: Rolling walker (2 wheeled) Transfers: Sit to/from [employer] Transfers Sit to Stand: Min assist;+2 safety/equipment;From elevated surface(blocking out RLE; bed at very high elevation) Stand pivot transfers: (assist to progress RLE, vc for sequencing)       General transfer comment: performed x2 during session with increased time and effort. VCs for hand placement and positioning during controlled descent to chair.  Cues for precuations  Ambulation/Gait Ambulation/Gait assistance: Min assist Ambulation Distance (Feet): 10 Feet Assistive device: Rolling  walker (2 wheeled) Gait Pattern/deviations: Step-to pattern Gait velocity: decreased Gait velocity interpretation: <1.31 ft/sec, indicative of household ambulator General Gait Details: VCs for sequencing, able to perform turn with mobility today, heavy reliance on RW, increased assist with fatigue.  VCs for quad setting during loading response of RLE   Stairs             Wheelchair Mobility    Modified Rankin (Stroke Patients Only)       Balance Overall balance assessment: Needs assistance Sitting-balance support: Bilateral upper extremity supported;Feet supported Sitting balance-Leahy Scale: Good     Standing balance support: Bilateral upper extremity supported;During functional activity Standing balance-Leahy Scale: Poor Standing balance comment: able to release Rw breifly today for ADL performance x3. Some noted improvements in RLE strength in static standing.                             Cognition Arousal/Alertness: Awake/alert Behavior During Therapy: WFL for tasks assessed/performed;Flat affect Overall Cognitive Status: Within Functional Limits for tasks assessed                                        Exercises Other Exercises Other Exercises: dynamic standing activities for pre gait mobility    General Comments        Pertinent Vitals/Pain Pain Assessment: Faces Faces Pain Scale: Hurts even more Pain Location: incision site and RLE Pain Descriptors / Indicators: Burning;Constant Pain Intervention(s): Monitored during session    Home Living  Prior Function            PT Goals (current goals can now be found in the care plan section) Acute Rehab PT Goals Patient Stated Goal: get RLE working again - back to independent PT Goal Formulation: With patient/family Time For Goal Achievement: 03/04/18 Potential to Achieve Goals: Good Progress towards PT goals: Progressing toward goals     Frequency    Min 5X/week      PT Plan Current plan remains appropriate    Co-evaluation PT/OT/SLP Co-Evaluation/Treatment: Yes Reason for Co-Treatment: For patient/therapist safety;To address functional/ADL transfers PT goals addressed during session: Mobility/safety with mobility OT goals addressed during session: ADL's and self-care      AM-PAC PT "6 Clicks" Daily Activity  Outcome Measure  Difficulty turning over in bed (including adjusting bedclothes, sheets and blankets)?: Unable Difficulty moving from lying on back to sitting on the side of the bed? : Unable Difficulty sitting down on and standing up from a chair with arms (e.g., wheelchair, bedside commode, etc,.)?: Unable Help needed moving to and from a bed to chair (including a wheelchair)?: A Lot Help needed walking in hospital room?: A Lot Help needed climbing 3-5 steps with a railing? : Total 6 Click Score: 8    End of Session   Activity Tolerance: Patient tolerated treatment well Patient left: in chair;with call bell/phone within reach;with family/visitor present Nurse Communication: Mobility status PT Visit Diagnosis: Unsteadiness on feet (R26.81);Muscle weakness (generalized) (M62.81);Pain;Difficulty in walking, not elsewhere classified (R26.2) Pain - Right/Left: Right Pain - part of body: Leg     Time: 7793-9030 PT Time Calculation (min) (ACUTE ONLY): 17 min  Charges:  $Therapeutic Activity: 8-22 mins                    G Codes:       Alben Deeds, PT DPT  Board Certified Neurologic Specialist [phone]    Duncan Dull 02/20/2018, 11:26 AM

## 2018-02-20 NOTE — Progress Notes (Signed)
Rehab admissions - I met with patient.  She would like CIR.  I have opened the case with [employer] requesting acute inpatient rehab admission.  I will follow up once I hear back from insurance case manager.  Call me for questions.  #[phone]

## 2018-02-20 NOTE — Progress Notes (Signed)
Patient ID: Ashley [name], female   DOB: 1975-12-16, 42 y.o.   MRN: [id number] All signs are stable Right leg remains weak Will need some inpatient rehabilitation Back is moderately sore Mobilize as tolerated

## 2018-02-20 NOTE — Progress Notes (Signed)
Occupational Therapy Treatment Patient Details Name: Ashley [name] MRN: [id number] DOB: Feb 15, 1976 Today's Date: 02/20/2018    History of present illness Pt is a 42 y/o female s/p Anterolateral decompression L3-4 and L4-5 with indirect decompression of L3-4 spacers spacer into L3-4 and L4-5. Percutaneous fixation L3 to the sacrum. 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   OT comments  This 42 yo female admitted and underwent above presents to acute OT making progress today with toilet transfers, toilet clothing manipulation, grooming at sink, and LBD with AE. She will continue to benefit from acute OT with follow up OT on CIR to get to a Mod I/Independent level.  Follow Up Recommendations  CIR;Supervision/Assistance - 24 hour    Equipment Recommendations  Other (comment)(TBD at next venue)    Recommendations for Other Services Rehab consult    Precautions / Restrictions Precautions Precautions: Back;Fall Precaution Booklet Issued: No Precaution Comments: reviewed verbally with Pt and son Restrictions Weight Bearing Restrictions: No       Mobility Bed Mobility               General bed mobility comments: received on BSC  Transfers Overall transfer level: Needs assistance Equipment used: Rolling walker (2 wheeled) Transfers: Sit to/from [employer] Transfers Sit to Stand: Min assist;+2 safety/equipment;From elevated surface(blocking out RLE; bed at very high elevation) Stand pivot transfers: (assist to progress RLE, vc for sequencing)       General transfer comment: performed x2 during session with increased time and effort. VCs for hand placement and positioning during controlled descent to chair.  Cues for precuations    Balance Overall balance assessment: Needs  assistance Sitting-balance support: Bilateral upper extremity supported;Feet supported Sitting balance-Leahy Scale: Good     Standing balance support: Bilateral upper extremity supported;During functional activity Standing balance-Leahy Scale: Poor Standing balance comment: able to release Rw breifly today for ADL performance x3. Some noted improvements in RLE strength in static standing.                            ADL either performed or assessed with clinical judgement   ADL Overall ADL's : Needs assistance/impaired     Grooming: Wash/dry face;Oral care;Standing;Minimal assistance Grooming Details (indicate cue type and reason): at sink             Lower Body Dressing: Minimal assistance;Adhering to back precautions;With adaptive equipment Lower Body Dressing Details (indicate cue type and reason): min A +2 sit<>stand; pt able to doff and don her socks with reacher and sock aid Toilet Transfer: Minimal assistance;+2 for safety/equipment;+2 for physical assistance;Ambulation;BSC;RW   Toileting- Clothing Manipulation and Hygiene: Moderate assistance Toileting - Clothing Manipulation Details (indicate cue type and reason): min A +2 sit<>stand; could do her clothing management but needed A for hygiene             Vision Baseline Vision/History: Wears glasses Wears Glasses: At all times Patient Visual Report: No change from baseline            Cognition Arousal/Alertness: Awake/alert Behavior During Therapy: Flat affect Overall Cognitive Status: Within Functional Limits for tasks assessed  Pertinent Vitals/ Pain       Pain Assessment: Faces Faces Pain Scale: Hurts whole lot Pain Location: incisional site (anterior) Pain Descriptors / Indicators: Sore;Aching;Constant Pain Intervention(s): Monitored during session;Limited activity within patient's tolerance;Repositioned;Patient  requesting pain meds-RN notified;RN gave pain meds during session         Frequency  Min 3X/week        Progress Toward Goals  OT Goals(current goals can now be found in the care plan section)  Progress towards OT goals: Progressing toward goals  Acute Rehab OT Goals Patient Stated Goal: get RLE working again - back to independent  Plan Discharge plan remains appropriate    Co-evaluation    PT/OT/SLP Co-Evaluation/Treatment: Yes Reason for Co-Treatment: For patient/therapist safety;To address functional/ADL transfers PT goals addressed during session: Mobility/safety with mobility OT goals addressed during session: ADL's and self-care      AM-PAC PT "6 Clicks" Daily Activity     Outcome Measure   Help from another person eating meals?: None Help from another person taking care of personal grooming?: A Little Help from another person toileting, which includes using toliet, bedpan, or urinal?: A Lot Help from another person bathing (including washing, rinsing, drying)?: A Lot Help from another person to put on and taking off regular upper body clothing?: A Little Help from another person to put on and taking off regular lower body clothing?: A Little(with AE) 6 Click Score: 8    End of Session Equipment Utilized During Treatment: Gait belt;Rolling walker;Back brace  OT Visit Diagnosis: Unsteadiness on feet (R26.81);Other abnormalities of gait and mobility (R26.89);Muscle weakness (generalized) (M62.81);Pain Pain - part of body: (incisional site (anterior))   Activity Tolerance Patient tolerated treatment well   Patient Left in chair;with call bell/phone within reach   Nurse Communication Mobility status;Patient requests pain meds        Time: 2952-8413 OT Time Calculation (min): 24 min  Charges: OT General Charges $OT Visit: 1 Visit OT Treatments $Self Care/Home Management : 8-22 mins  [address], OTR/L [phone] 02/20/2018

## 2018-02-21 ENCOUNTER — Inpatient Hospital Stay (HOSPITAL_COMMUNITY)
Admission: RE | Admit: 2018-02-21 | Discharge: 2018-03-07 | DRG: 560 | Disposition: A | Discharge status: Home Health | Payer: BLUE CROSS/BLUE SHIELD | Source: Intra-hospital | Attending: Physical Medicine & Rehabilitation | Admitting: Physical Medicine & Rehabilitation

## 2018-02-21 ENCOUNTER — Other Ambulatory Visit: Payer: Self-pay

## 2018-02-21 ENCOUNTER — Encounter (HOSPITAL_COMMUNITY): Payer: Self-pay | Admitting: Neurological Surgery

## 2018-02-21 DIAGNOSIS — F329 Major depressive disorder, single episode, unspecified: Secondary | ICD-10-CM | POA: Diagnosis present

## 2018-02-21 DIAGNOSIS — Z96653 Presence of artificial knee joint, bilateral: Secondary | ICD-10-CM | POA: Diagnosis present

## 2018-02-21 DIAGNOSIS — Z885 Allergy status to narcotic agent status: Secondary | ICD-10-CM

## 2018-02-21 DIAGNOSIS — Z981 Arthrodesis status: Secondary | ICD-10-CM

## 2018-02-21 DIAGNOSIS — Z91041 Radiographic dye allergy status: Secondary | ICD-10-CM

## 2018-02-21 DIAGNOSIS — R339 Retention of urine, unspecified: Secondary | ICD-10-CM

## 2018-02-21 DIAGNOSIS — R0989 Other specified symptoms and signs involving the circulatory and respiratory systems: Secondary | ICD-10-CM | POA: Diagnosis not present

## 2018-02-21 DIAGNOSIS — K5903 Drug induced constipation: Secondary | ICD-10-CM

## 2018-02-21 DIAGNOSIS — M7989 Other specified soft tissue disorders: Secondary | ICD-10-CM | POA: Diagnosis not present

## 2018-02-21 DIAGNOSIS — E282 Polycystic ovarian syndrome: Secondary | ICD-10-CM | POA: Diagnosis present

## 2018-02-21 DIAGNOSIS — R52 Pain, unspecified: Secondary | ICD-10-CM | POA: Diagnosis not present

## 2018-02-21 DIAGNOSIS — Z4789 Encounter for other orthopedic aftercare: Secondary | ICD-10-CM | POA: Diagnosis present

## 2018-02-21 DIAGNOSIS — I10 Essential (primary) hypertension: Secondary | ICD-10-CM | POA: Diagnosis present

## 2018-02-21 DIAGNOSIS — Z6836 Body mass index (BMI) 36.0-36.9, adult: Secondary | ICD-10-CM | POA: Diagnosis not present

## 2018-02-21 DIAGNOSIS — M792 Neuralgia and neuritis, unspecified: Secondary | ICD-10-CM

## 2018-02-21 DIAGNOSIS — Z96641 Presence of right artificial hip joint: Secondary | ICD-10-CM | POA: Diagnosis present

## 2018-02-21 DIAGNOSIS — K5900 Constipation, unspecified: Secondary | ICD-10-CM | POA: Diagnosis present

## 2018-02-21 DIAGNOSIS — F411 Generalized anxiety disorder: Secondary | ICD-10-CM

## 2018-02-21 DIAGNOSIS — R251 Tremor, unspecified: Secondary | ICD-10-CM | POA: Diagnosis not present

## 2018-02-21 DIAGNOSIS — Z8249 Family history of ischemic heart disease and other diseases of the circulatory system: Secondary | ICD-10-CM | POA: Diagnosis not present

## 2018-02-21 DIAGNOSIS — G8918 Other acute postprocedural pain: Secondary | ICD-10-CM | POA: Diagnosis not present

## 2018-02-21 DIAGNOSIS — T402X5A Adverse effect of other opioids, initial encounter: Secondary | ICD-10-CM

## 2018-02-21 DIAGNOSIS — Z888 Allergy status to other drugs, medicaments and biological substances status: Secondary | ICD-10-CM | POA: Diagnosis not present

## 2018-02-21 DIAGNOSIS — W19XXXA Unspecified fall, initial encounter: Secondary | ICD-10-CM | POA: Diagnosis not present

## 2018-02-21 DIAGNOSIS — M5416 Radiculopathy, lumbar region: Secondary | ICD-10-CM | POA: Diagnosis present

## 2018-02-21 DIAGNOSIS — D62 Acute posthemorrhagic anemia: Secondary | ICD-10-CM | POA: Diagnosis present

## 2018-02-21 DIAGNOSIS — Z79899 Other long term (current) drug therapy: Secondary | ICD-10-CM

## 2018-02-21 MED ORDER — GABAPENTIN 300 MG PO CAPS
600.0000 mg | ORAL_CAPSULE | Freq: Four times a day (QID) | ORAL | Status: DC
  Administered 2018-02-21 – 2018-02-27 (×23): 600 mg via ORAL
  Filled 2018-02-21 (×22): qty 2
  Filled 2018-02-21: qty 6

## 2018-02-21 MED ORDER — LORATADINE 10 MG PO TABS
10.0000 mg | ORAL_TABLET | Freq: Every day | ORAL | Status: DC
  Administered 2018-02-22 – 2018-03-07 (×14): 10 mg via ORAL
  Filled 2018-02-21 (×14): qty 1

## 2018-02-21 MED ORDER — SERTRALINE HCL 50 MG PO TABS
50.0000 mg | ORAL_TABLET | Freq: Every day | ORAL | Status: AC
  Administered 2018-02-22 – 2018-02-23 (×2): 50 mg via ORAL
  Filled 2018-02-21 (×2): qty 1

## 2018-02-21 MED ORDER — LOSARTAN POTASSIUM 50 MG PO TABS
50.0000 mg | ORAL_TABLET | Freq: Every day | ORAL | Status: DC
  Administered 2018-02-22 – 2018-03-07 (×14): 50 mg via ORAL
  Filled 2018-02-21 (×14): qty 1

## 2018-02-21 MED ORDER — HYDROCODONE-ACETAMINOPHEN 5-325 MG PO TABS
2.0000 | ORAL_TABLET | ORAL | Status: DC | PRN
  Filled 2018-02-21: qty 2

## 2018-02-21 MED ORDER — POLYETHYLENE GLYCOL 3350 17 G PO PACK: 17.0000 g | PACK | Freq: Every day | ORAL | Status: DC | PRN

## 2018-02-21 MED ORDER — ACETAMINOPHEN 650 MG RE SUPP: 650.0000 mg | RECTAL | Status: DC | PRN

## 2018-02-21 MED ORDER — ALUM & MAG HYDROXIDE-SIMETH 200-200-20 MG/5ML PO SUSP: 30.0000 mL | Freq: Four times a day (QID) | ORAL | Status: DC | PRN

## 2018-02-21 MED ORDER — METHOCARBAMOL 500 MG PO TABS: 500.0000 mg | ORAL_TABLET | Freq: Four times a day (QID) | ORAL | Status: DC | PRN

## 2018-02-21 MED ORDER — METHOCARBAMOL 500 MG PO TABS
500.0000 mg | ORAL_TABLET | Freq: Four times a day (QID) | ORAL | Status: DC | PRN
  Administered 2018-02-24 – 2018-02-26 (×3): 500 mg via ORAL
  Filled 2018-02-21 (×4): qty 1

## 2018-02-21 MED ORDER — OXYCODONE-ACETAMINOPHEN 5-325 MG PO TABS
1.0000 | ORAL_TABLET | ORAL | Status: DC | PRN
Start: 2018-02-21 — End: 2018-02-22
  Administered 2018-02-21 – 2018-02-22 (×3): 2 via ORAL
  Filled 2018-02-21 (×3): qty 2

## 2018-02-21 MED ORDER — BACLOFEN 20 MG PO TABS
20.0000 mg | ORAL_TABLET | Freq: Four times a day (QID) | ORAL | Status: DC
  Administered 2018-02-21 – 2018-02-27 (×23): 20 mg via ORAL
  Filled 2018-02-21 (×23): qty 1

## 2018-02-21 MED ORDER — DOCUSATE SODIUM 100 MG PO CAPS
100.0000 mg | ORAL_CAPSULE | Freq: Two times a day (BID) | ORAL | Status: DC
  Administered 2018-02-21 – 2018-03-07 (×27): 100 mg via ORAL
  Filled 2018-02-21 (×27): qty 1

## 2018-02-21 MED ORDER — BUPROPION HCL ER (XL) 150 MG PO TB24
300.0000 mg | ORAL_TABLET | Freq: Every day | ORAL | Status: DC
  Administered 2018-02-22 – 2018-03-07 (×14): 300 mg via ORAL
  Filled 2018-02-21 (×15): qty 2

## 2018-02-21 MED ORDER — METHOCARBAMOL 1000 MG/10ML IJ SOLN
500.0000 mg | Freq: Four times a day (QID) | INTRAVENOUS | Status: DC | PRN
  Filled 2018-02-21: qty 5

## 2018-02-21 MED ORDER — ALPRAZOLAM 0.25 MG PO TABS: 1.0000 mg | ORAL_TABLET | Freq: Every day | ORAL | Status: DC | PRN

## 2018-02-21 MED ORDER — SERTRALINE HCL 100 MG PO TABS
100.0000 mg | ORAL_TABLET | Freq: Every day | ORAL | Status: DC
  Administered 2018-02-24 – 2018-03-07 (×12): 100 mg via ORAL
  Filled 2018-02-21 (×12): qty 1

## 2018-02-21 MED ORDER — HYDROCHLOROTHIAZIDE 12.5 MG PO CAPS
12.5000 mg | ORAL_CAPSULE | Freq: Every day | ORAL | Status: DC
  Administered 2018-02-22 – 2018-03-07 (×14): 12.5 mg via ORAL
  Filled 2018-02-21 (×15): qty 1

## 2018-02-21 MED ORDER — BISACODYL 10 MG RE SUPP: 10.0000 mg | Freq: Every day | RECTAL | Status: DC | PRN

## 2018-02-21 MED ORDER — ONDANSETRON HCL 4 MG PO TABS: 4.0000 mg | ORAL_TABLET | Freq: Four times a day (QID) | ORAL | Status: DC | PRN

## 2018-02-21 MED ORDER — ACETAMINOPHEN 325 MG PO TABS: 650.0000 mg | ORAL_TABLET | ORAL | Status: DC | PRN

## 2018-02-21 MED ORDER — FUROSEMIDE 20 MG PO TABS: 20.0000 mg | ORAL_TABLET | Freq: Every day | ORAL | Status: DC | PRN

## 2018-02-21 MED ORDER — ONDANSETRON HCL 4 MG/2ML IJ SOLN: 4.0000 mg | Freq: Four times a day (QID) | INTRAMUSCULAR | Status: DC | PRN

## 2018-02-21 NOTE — Progress Notes (Signed)
Jamse Arn, MD  Physician  Physical Medicine and Rehabilitation  Consult Note  Signed  Date of Service:  02/18/2018 10:09 PM       Related encounter: Admission (Discharged) from 02/17/2018 in [facility]      Signed      Expand All Collapse All       Show:Clear all [x] Manual[x] Template[x] Copied  Added by: [x] Posey Pronto Domenick Bookbinder, MD   [] Hover for details        Physical Medicine and Rehabilitation Consult Reason for Consult: Right iliopsoas weakness Referring Physician: Kristeen Miss, MD   HPI: Ashley [name] is a 42 y.o. female with past medical history/past medical history of PCOS, lumbar spondylosis, hypertension,  morbid obesity, depression, arthritis, bilateral TKA right THA, right shoulder surgery, bilateral carpal tunnel release, ankle surgery, back surgery presented on 5/31 for back surgery. Patient had a history of disc herniation and underwent discectomy. She became progressively worse and repeat imaging suggested degenerative changes in L3-5. She underwent L3 to sacrum decompression and fixation. She was  Later taken back for anterior lumbar interbody arthrodesis  From L5-S1and lateral decompression L3-5 and posterior  Fixation L3 to sacrum. Hospital course complicated by postoperative pain in acute blood loss anemia.   Review of Systems  Musculoskeletal: Positive for back pain.  Neurological: Positive for sensory change and focal weakness.  All other systems reviewed and are negative.      Past Medical History:  Diagnosis Date  . Arthritis    knees, back  . 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 Complication of anesthesia   . Depression   . History of pneumonia   . Hypertension   . Lumbar spondylosis   . PCOS (polycystic ovarian syndrome)   . Pneumonia    2016  . PONV (postoperative nausea and vomiting)         Past Surgical History:  Procedure Laterality Date  . ANKLE SURGERY     x  2  . BACK SURGERY     2017  discectomy  . CARPAL TUNNEL RELEASE Left 07/04/2014   Procedure: LEFT CARPAL TUNNEL RELEASE;  Surgeon: Ninetta Lights, MD;  Location: [facility];  Service: Orthopedics;  Laterality: Left;  . CARPAL TUNNEL RELEASE Right 08/08/2014   Procedure: RIGHT CARPAL TUNNEL RELEASE;  Surgeon: Ninetta Lights, MD;  Location: [facility];  Service: Orthopedics;  Laterality: Right;  . DILATION AND CURETTAGE OF UTERUS    . DILATION AND EVACUATION  04/02/2011   Procedure: DILATATION AND EVACUATION (D&E);  Surgeon: Luz Lex, MD;  Location: [facility];  Service: Gynecology;  Laterality: N/A;  dvt left mid thigh  . DORSAL COMPARTMENT RELEASE Left 07/04/2014   Procedure: LEFT DEQUERVAINS;  Surgeon: Ninetta Lights, MD;  Location: [facility];  Service: Orthopedics;  Laterality: Left;  . ENDOSCOPIC PLANTAR FASCIOTOMY Left 02/01/2002  . HYSTEROSCOPY W/D&C  07/17/2010   with exc. endometrial polyps  . KNEE ARTHROSCOPY Right 03/07/2003; 01/14/2005; 08/31/2006  . KNEE ARTHROSCOPY Left 03/21/2003; 11/26/2004  . SHOULDER ARTHROSCOPY Right   . TOE SURGERY Left 01/10/2003   claw toe correction 2nd, 3rd, 4th toes  . TOTAL HIP ARTHROPLASTY Right 04/21/2016  . TOTAL HIP ARTHROPLASTY Right 04/21/2016   Procedure: TOTAL HIP ARTHROPLASTY ANTERIOR APPROACH;  Surgeon: Ninetta Lights, MD;  Location: [facility];  Service: Orthopedics;  Laterality: Right;  . TOTAL KNEE ARTHROPLASTY Left 01/21/2010  . TOTAL KNEE ARTHROPLASTY Right 08/26/2008  Family History  Problem Relation Age of Onset  . Cancer Maternal Grandmother        UTERINE  . Cancer Paternal Grandmother        breast  . Diabetes Father   . Cancer Father        COLON  . Heart disease Father   . Kidney disease Father        dialysis  . Diabetes Sister    Social History:  reports that she has never smoked. She has never used smokeless tobacco. She reports that she does not  drink alcohol or use drugs. Allergies:       Allergies  Allergen Reactions  . Gadolinium Derivatives Nausea And Vomiting    Pt describes this happens every time she gets gado even with slow injection  . Codeine Itching and Nausea Only  . Cymbalta [Duloxetine Hcl] Other (See Comments)    Altered mental status         Medications Prior to Admission  Medication Sig Dispense Refill  . ALPRAZolam (XANAX) 1 MG tablet Take 1 mg by mouth daily as needed for anxiety.    . baclofen (LIORESAL) 10 MG tablet Take 10 mg by mouth 3 (three) times daily as needed for muscle spasms.     Marland Kitchen buPROPion (WELLBUTRIN XL) 300 MG 24 hr tablet Take 300 mg daily by mouth.    . cetirizine (ZYRTEC) 10 MG tablet Take 10 mg by mouth as needed.     . docusate sodium (COLACE) 100 MG capsule Take 100 mg by mouth 2 (two) times daily as needed for mild constipation.    Marland Kitchen EPINEPHrine 0.3 mg/0.3 mL IJ SOAJ injection Inject 0.3 mg into the muscle as needed (allergic reaction).   0  . furosemide (LASIX) 20 MG tablet Take 20 mg by mouth daily as needed for fluid.    Marland Kitchen gabapentin (NEURONTIN) 300 MG capsule Take 600 mg by mouth 4 (four) times daily.     Marland Kitchen ibuprofen (ADVIL,MOTRIN) 600 MG tablet Take 600 mg by mouth 2 (two) times daily as needed for pain.  2  . Lorcaserin HCl ER (BELVIQ XR) 20 MG TB24 Take 20 mg by mouth daily.    Marland Kitchen losartan-hydrochlorothiazide (HYZAAR) 50-12.5 MG tablet Take 1 tablet by mouth daily. 90 tablet 3  . methocarbamol (ROBAXIN) 500 MG tablet Take 500 mg by mouth every 6 (six) hours as needed for muscle spasms.    . Multiple Vitamins-Minerals (MULTIVITAMIN WITH MINERALS) tablet Take 1 tablet daily by mouth.    . nabumetone (RELAFEN) 500 MG tablet Take 500 mg 2 (two) times daily by mouth.    . oxyCODONE-acetaminophen (PERCOCET) 10-325 MG tablet Take 1 tablet every 6 (six) hours as needed by mouth for pain.    Marland Kitchen sertraline (ZOLOFT) 50 MG tablet Take 50-100 mg by mouth See admin  instructions. Take 50 mg once daily for 7 days then take 100 mg once daily      Home: Home Living Family/patient expects to be discharged to:: Private residence Living Arrangements: Children Available Help at Discharge: Family, Available 24 hours/day Type of Home: House Home Access: Level entry Home Layout: One level Bathroom Shower/Tub: Chiropodist: Handicapped height Bathroom Accessibility: Yes Home Equipment: Environmental consultant - 2 wheels, Cane - single point, Hand held shower head, Shower seat Additional Comments: also has a 43 year old that relies on her  Functional History: Prior Function Level of Independence: Independent Functional Status:  Mobility: Bed Mobility Overal bed mobility: Needs  Assistance Bed Mobility: Rolling, Sidelying to Sit Rolling: Mod assist, +2 for safety/equipment(vc for sequencing) Sidelying to sit: Mod assist, +2 for physical assistance, +2 for safety/equipment, HOB elevated General bed mobility comments: vc for sequencing, assist for trunk elevation, Pt assisting with UE on rail Transfers Overall transfer level: Needs assistance Equipment used: Rolling walker (2 wheeled) Transfers: Sit to/from Stand, Stand Pivot Transfers Sit to Stand: Max assist, +2 physical assistance, +2 safety/equipment, From elevated surface(blocking out RLE; bed at very high elevation) Stand pivot transfers: Min assist, +2 physical assistance, +2 safety/equipment, From elevated surface(assist to progress RLE, vc for sequencing) General transfer comment: vc for safety/sequencing.   ADL: ADL Overall ADL's : Needs assistance/impaired Eating/Feeding: Set up, Sitting Grooming: Wash/dry face, Set up, Sitting Upper Body Bathing: Maximal assistance, Sitting Lower Body Bathing: Maximal assistance Upper Body Dressing : Maximal assistance Upper Body Dressing Details (indicate cue type and reason): to don hospital gown Lower Body Dressing: Maximal assistance, +2 for  safety/equipment Toilet Transfer: Moderate assistance, +2 for physical assistance, +2 for safety/equipment, RW Toilet Transfer Details (indicate cue type and reason): simulated through recliner transfer Crisp and Hygiene: Total assistance Functional mobility during ADLs: +2 for physical assistance, +2 for safety/equipment, Moderate assistance, Rolling walker(SPT only)  Cognition: Cognition Overall Cognitive Status: Within Functional Limits for tasks assessed Orientation Level: Oriented X4 Cognition Arousal/Alertness: Awake/alert Behavior During Therapy: WFL for tasks assessed/performed, Flat affect(upset about RLE) Overall Cognitive Status: Within Functional Limits for tasks assessed  Blood pressure (!) 103/59, pulse 93, temperature 97.7 F (36.5 C), temperature source Oral, resp. rate 16, last menstrual period 01/28/2018, SpO2 99 %. Physical Exam  Constitutional: She is oriented to person, place, and time. She appears well-developed.  Obese Mildly distressed  HENT:  Head: Normocephalic and atraumatic.  Eyes: EOM are normal. Right eye exhibits no discharge. Left eye exhibits no discharge.  Neck: Normal range of motion. Neck supple.  Cardiovascular: Normal rate and regular rhythm.  Respiratory: Effort normal and breath sounds normal.  GI: Soft. Bowel sounds are normal.  Musculoskeletal:  No edema or tenderness in extremities  Neurological: She is alert and oriented to person, place, and time.  Motor: Bilateral upper extremity: 5/5 proximal distal Left lower extremity: 4/5 proximal distal Right lower extremity: Flexion, knee extension 0/5, ankle dorsiflexion 4+/5 Sensation diminished to light touch in right thigh  Skin: Skin is warm and dry.  Psychiatric: She has a normal mood and affect. Her behavior is normal.             Assessment/Plan: Diagnosis: right lower extremity weakness Labs independently reviewed.  Records reviewed and summated  above.  1. Does the need for close, 24 hr/day medical supervision in concert with the patient's rehab needs make it unreasonable for this patient to be served in a less intensive setting? Yes  2. Co-Morbidities requiring supervision/potential complications: PCOS, lumbar spondylosis, HTN (monitor and provide prns in accordance with increased physical exertion and pain), morbid obesity (encourage weight loss), depression (ensure mood does not hinder progress of therapies), OA (ensure pain does not limit therapies), postoperative pain (Biofeedback training with therapies to help reduce reliance on opiate pain medications, particularly IV morphine, monitor pain control during therapies, and sedation at rest and titrate to maximum efficacy to ensure participation and gains in therapies), acute blood loss anemia (transfuse if necessary to ensure appropriate perfusion for increased activity tolerance) 3. Due to safety, skin/wound care, disease management, pain management and patient education, does the patient require 24 hr/day rehab  nursing? Yes 4. Does the patient require coordinated care of a physician, rehab nurse, PT (1-2 hrs/day, 5 days/week) and OT (1-2 hrs/day, 5 days/week) to address physical and functional deficits in the context of the above medical diagnosis(es)? Yes Addressing deficits in the following areas: balance, endurance, locomotion, strength, transferring, bathing, dressing, toileting and psychosocial support 5. Can the patient actively participate in an intensive therapy program of at least 3 hrs of therapy per day at least 5 days per week? Yes 6. The potential for patient to make measurable gains while on inpatient rehab is excellent 7. Anticipated functional outcomes upon discharge from inpatient rehab are min assist  with PT, min assist with OT, n/a with SLP. 8. Estimated rehab length of stay to reach the above functional goals is: 12-16 days. 9. Anticipated D/C setting:  Home 10. Anticipated post D/C treatments: HH therapy and Home excercise program 11. Overall Rehab/Functional Prognosis: good  RECOMMENDATIONS: This patient's condition is appropriate for continued rehabilitative care in the following setting: CIR Patient has agreed to participate in recommended program. Yes Note that insurance prior authorization may be required for reimbursement for recommended care.  Comment: Rehab Admissions Coordinator to follow up.  Delice Lesch, MD, ABPMR 02/18/2018           Routing History

## 2018-02-21 NOTE — H&P (Signed)
Physical Medicine and Rehabilitation Admission H&P    Chief complaint: Back pain  HPI: Ashley [name] is a 42 year old right-handed female with past history of polycystic ovarian syndrome, hypertension, morbid obesity, bilateral TKA and right THA, lumbar spondylosis with chronic radicular back pain with herniated nucleus pulposus at L4-5 and back surgery 2017.  Per chart review patient lives with 16-year-old and 10 year old son.  Reported to be independent prior to admission but sedentary.  Son can provide assistance as needed.  One level home with one-step to entry.  Presented 02/17/2018 with progressive back pain radiating to the lower extremities.  X-rays and imaging as well as recent myelogram showed degenerative disc disease L3-4, 4-5 and L5-S1 with chronic lumbar radiculopathy.  Underwent anterior exposure of L5-S1 per Dr. Donnetta Hutching as well as anterolateral decompression L3-4 and L4-5 as well as percutaneous fixation L3 to the sacrum 02/17/2018 per Dr. Ellene Route.  Hospital course pain management.  Acute blood loss anemia 9.4 and monitored.  Back brace when out of bed.  Physical and occupational therapy evaluations completed with recommendations of physical medicine rehab consult.  Patient was admitted for a comprehensive rehab program.  Review of Systems  Constitutional: Negative for chills and fever.  HENT: Negative for hearing loss.   Eyes: Negative for blurred vision and double vision.  Respiratory: Negative for cough and shortness of breath.   Cardiovascular: Positive for leg swelling. Negative for chest pain and palpitations.  Gastrointestinal: Positive for constipation. Negative for nausea and vomiting.  Genitourinary: Negative for dysuria, flank pain and hematuria.  Musculoskeletal: Positive for back pain, joint pain and myalgias.  Skin: Negative for rash.  Neurological: Negative for seizures.  Psychiatric/Behavioral: Positive for depression.  All other systems reviewed and are  negative.      Past Medical History:  Diagnosis Date  . Arthritis    knees, back  . 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 Complication of anesthesia   . Depression   . History of pneumonia   . Hypertension   . Lumbar spondylosis   . PCOS (polycystic ovarian syndrome)   . Pneumonia    2016  . PONV (postoperative nausea and vomiting)    Past Surgical History:  Procedure Laterality Date  . ANKLE SURGERY     x 2  . BACK SURGERY     2017  discectomy  . CARPAL TUNNEL RELEASE Left 07/04/2014   Procedure: LEFT CARPAL TUNNEL RELEASE;  Surgeon: Ninetta Lights, MD;  Location: [facility];  Service: Orthopedics;  Laterality: Left;  . CARPAL TUNNEL RELEASE Right 08/08/2014   Procedure: RIGHT CARPAL TUNNEL RELEASE;  Surgeon: Ninetta Lights, MD;  Location: [facility];  Service: Orthopedics;  Laterality: Right;  . DILATION AND CURETTAGE OF UTERUS    . DILATION AND EVACUATION  04/02/2011   Procedure: DILATATION AND EVACUATION (D&E);  Surgeon: Luz Lex, MD;  Location: [facility];  Service: Gynecology;  Laterality: N/A;  dvt left mid thigh  . DORSAL COMPARTMENT RELEASE Left 07/04/2014   Procedure: LEFT DEQUERVAINS;  Surgeon: Ninetta Lights, MD;  Location: [facility];  Service: Orthopedics;  Laterality: Left;  . ENDOSCOPIC PLANTAR FASCIOTOMY Left 02/01/2002  . HYSTEROSCOPY W/D&C  07/17/2010   with exc. endometrial polyps  . KNEE ARTHROSCOPY Right 03/07/2003; 01/14/2005; 08/31/2006  . KNEE ARTHROSCOPY Left 03/21/2003; 11/26/2004  . SHOULDER ARTHROSCOPY Right   . TOE SURGERY Left 01/10/2003   claw toe correction 2nd, 3rd, 4th toes  .  TOTAL HIP ARTHROPLASTY Right 04/21/2016  . TOTAL HIP ARTHROPLASTY Right 04/21/2016   Procedure: TOTAL HIP ARTHROPLASTY ANTERIOR APPROACH;  Surgeon: Ninetta Lights, MD;  Location: [facility];  Service: Orthopedics;  Laterality: Right;  . TOTAL KNEE ARTHROPLASTY Left 01/21/2010  . TOTAL KNEE  ARTHROPLASTY Right 08/26/2008        Family History  Problem Relation Age of Onset  . Cancer Maternal Grandmother        UTERINE  . Cancer Paternal Grandmother        breast  . Diabetes Father   . Cancer Father        COLON  . Heart disease Father   . Kidney disease Father        dialysis  . Diabetes Sister    Social History:  reports that she has never smoked. She has never used smokeless tobacco. She reports that she does not drink alcohol or use drugs. Allergies:       Allergies  Allergen Reactions  . Gadolinium Derivatives Nausea And Vomiting    Pt describes this happens every time she gets gado even with slow injection  . Codeine Itching and Nausea Only  . Cymbalta [Duloxetine Hcl] Other (See Comments)    Altered mental status         Medications Prior to Admission  Medication Sig Dispense Refill  . ALPRAZolam (XANAX) 1 MG tablet Take 1 mg by mouth daily as needed for anxiety.    . baclofen (LIORESAL) 10 MG tablet Take 10 mg by mouth 3 (three) times daily as needed for muscle spasms.     Marland Kitchen buPROPion (WELLBUTRIN XL) 300 MG 24 hr tablet Take 300 mg daily by mouth.    . cetirizine (ZYRTEC) 10 MG tablet Take 10 mg by mouth as needed.     . docusate sodium (COLACE) 100 MG capsule Take 100 mg by mouth 2 (two) times daily as needed for mild constipation.    Marland Kitchen EPINEPHrine 0.3 mg/0.3 mL IJ SOAJ injection Inject 0.3 mg into the muscle as needed (allergic reaction).   0  . furosemide (LASIX) 20 MG tablet Take 20 mg by mouth daily as needed for fluid.    Marland Kitchen gabapentin (NEURONTIN) 300 MG capsule Take 600 mg by mouth 4 (four) times daily.     Marland Kitchen ibuprofen (ADVIL,MOTRIN) 600 MG tablet Take 600 mg by mouth 2 (two) times daily as needed for pain.  2  . Lorcaserin HCl ER (BELVIQ XR) 20 MG TB24 Take 20 mg by mouth daily.    Marland Kitchen losartan-hydrochlorothiazide (HYZAAR) 50-12.5 MG tablet Take 1 tablet by mouth daily. 90 tablet 3  . methocarbamol (ROBAXIN) 500 MG  tablet Take 500 mg by mouth every 6 (six) hours as needed for muscle spasms.    . Multiple Vitamins-Minerals (MULTIVITAMIN WITH MINERALS) tablet Take 1 tablet daily by mouth.    . nabumetone (RELAFEN) 500 MG tablet Take 500 mg 2 (two) times daily by mouth.    . oxyCODONE-acetaminophen (PERCOCET) 10-325 MG tablet Take 1 tablet every 6 (six) hours as needed by mouth for pain.    Marland Kitchen sertraline (ZOLOFT) 50 MG tablet Take 50-100 mg by mouth See admin instructions. Take 50 mg once daily for 7 days then take 100 mg once daily      Drug Regimen Review Drug regimen was reviewed and remains appropriate with no significant issues identified  Home: Home Living Family/patient expects to be discharged to:: Private residence Living Arrangements: Children Available Help at Discharge: Family,  Available 24 hours/day Type of Home: House Home Access: Level entry Home Layout: One level Bathroom Shower/Tub: Chiropodist: Handicapped height Bathroom Accessibility: Yes Home Equipment: Environmental consultant - 2 wheels, Cane - single point, Hand held shower head, Shower seat Additional Comments: also has a 43 year old that relies on her   Functional History: Prior Function Level of Independence: Independent  Functional Status:  Mobility: Bed Mobility Overal bed mobility: Needs Assistance Bed Mobility: Rolling, Sidelying to Sit Rolling: Mod assist, +2 for safety/equipment(vc for sequencing) Sidelying to sit: Mod assist General bed mobility comments: received on BSC Transfers Overall transfer level: Needs assistance Equipment used: Rolling walker (2 wheeled) Transfers: Sit to/from Stand, Stand Pivot Transfers Sit to Stand: Min assist, +2 safety/equipment, From elevated surface(blocking out RLE; bed at very high elevation) Stand pivot transfers: (assist to progress RLE, vc for sequencing) General transfer comment: performed x2 during session with increased time and effort. VCs for hand  placement and positioning during controlled descent to chair.  Cues for precuations Ambulation/Gait Ambulation/Gait assistance: Min assist Ambulation Distance (Feet): 10 Feet Assistive device: Rolling walker (2 wheeled) Gait Pattern/deviations: Step-to pattern General Gait Details: VCs for sequencing, able to perform turn with mobility today, heavy reliance on RW, increased assist with fatigue.  VCs for quad setting during loading response of RLE Gait velocity: decreased Gait velocity interpretation: <1.31 ft/sec, indicative of household ambulator  ADL: ADL Overall ADL's : Needs assistance/impaired Eating/Feeding: Set up, Sitting Grooming: Wash/dry face, Oral care, Standing, Minimal assistance Grooming Details (indicate cue type and reason): at sink Upper Body Bathing: Maximal assistance, Sitting Lower Body Bathing: Maximal assistance Upper Body Dressing : Maximal assistance Upper Body Dressing Details (indicate cue type and reason): to don hospital gown Lower Body Dressing: Minimal assistance, Adhering to back precautions, With adaptive equipment Lower Body Dressing Details (indicate cue type and reason): min A +2 sit<>stand; pt able to doff and don her socks with reacher and sock aid Toilet Transfer: Minimal assistance, +2 for safety/equipment, +2 for physical assistance, Ambulation, BSC, RW Toilet Transfer Details (indicate cue type and reason): simulated through recliner transfer Toileting- Clothing Manipulation and Hygiene: Moderate assistance Toileting - Clothing Manipulation Details (indicate cue type and reason): min A +2 sit<>stand; could do her clothing management but needed A for hygiene Functional mobility during ADLs: +2 for physical assistance, +2 for safety/equipment, Moderate assistance, Rolling walker(SPT only)  Cognition: Cognition Overall Cognitive Status: Within Functional Limits for tasks assessed Orientation Level: Oriented X4 Cognition Arousal/Alertness:  Awake/alert Behavior During Therapy: Flat affect Overall Cognitive Status: Within Functional Limits for tasks assessed  Physical Exam: Blood pressure 128/77, pulse 94, temperature 98.2 F (36.8 C), temperature source Oral, resp. rate 17, last menstrual period 01/28/2018, SpO2 100 %. Physical Exam  Vitals reviewed. Constitutional: She appears well-developed.  Obese  HENT:  Head: Normocephalic and atraumatic.  Neck: Normal range of motion.  Cardiovascular: Normal rate and regular rhythm. Exam reveals no gallop and no friction rub.  No murmur heard. Respiratory: Effort normal. No respiratory distress. She has no rales.  GI: Soft. Bowel sounds are normal. She exhibits no distension. There is tenderness.  Musculoskeletal:  Pt tender near operative sites  Neurological: She is alert. She has normal reflexes. No cranial nerve deficit.  UE motor 5/5 prox to distal. RLE: 3-4/5 HE with pain inhibition. 1-2/5 HAB and KE. ADF/PF 4/5. LLE: 3-4/5 HF, KE and 5/5 ADF/PF. Decreased sensation over anterior right thigh. DTR's tr in bilateral LE's.   Skin:  Flank/abdominal incisions  clean and intact.   Psychiatric: She has a normal mood and affect. Her behavior is normal.          Medical Problem List and Plan: 1.  Decreased functional mobility secondary to lumbar spondylosis with radiculopathy.S/P decompression L3-4 and L4-5 with anterior exposure of L5-S1 as well as percutaneous fixation L3 to the sacrum 02/17/2018. Appears to have significant right L3 radiculopathy.  Back brace when out of bed             -admit to inpatient rehab 2.  DVT Prophylaxis/Anticoagulation: SCDs.  Check vascular study 3. Pain Management: Neurontin 600 mg 4 times daily, baclofen 20 mg 4 times daily, hydrocodone and Robaxin as needed 4. Mood: Wellbutrin XL 300 mg daily, Zoloft 50 mg daily, Xanax 1 mg daily as needed 5. Neuropsych: This patient is capable of making decisions on her own behalf. 6. Skin/Wound Care:  Routine skin checks 7. Fluids/Electrolytes/Nutrition: Routine in and outs with follow-up chemistries 8.  Acute blood loss anemia.  Follow-up CBC 9.  Hypertension.  Cozaar 50 mg daily, HCTZ 12.5 mg daily. 10.  Obesity.  262 pounds.  Follow-up dietary 11.  Osteoarthritis.  History of bilateral total knee replacement as well as right THA 12.  Constipation.  Laxative assistance  Post Admission Physician Evaluation: 1. Functional deficits secondary  to Lumbar spondylosis with radic s/p decompression and fusion. 2. Patient is admitted to receive collaborative, interdisciplinary care between the physiatrist, rehab nursing staff, and therapy team. 3. Patient's level of medical complexity and substantial therapy needs in context of that medical necessity cannot be provided at a lesser intensity of care such as a SNF. 4. Patient has experienced substantial functional loss from his/her baseline which was documented above under the "Functional History" and "Functional Status" headings.  Judging by the patient's diagnosis, physical exam, and functional history, the patient has potential for functional progress which will result in measurable gains while on inpatient rehab.  These gains will be of substantial and practical use upon discharge  in facilitating mobility and self-care at the household level. 5. Physiatrist will provide 24 hour management of medical needs as well as oversight of the therapy plan/treatment and provide guidance as appropriate regarding the interaction of the two. 6. The Preadmission Screening has been reviewed and patient status is unchanged unless otherwise stated above. 7. 24 hour rehab nursing will assist with bladder management, bowel management, safety, skin/wound care, disease management, medication administration, pain management and patient education  and help integrate therapy concepts, techniques,education, etc. 8. PT will assess and treat for/with: Lower extremity strength,  range of motion, stamina, balance, functional mobility, safety, adaptive techniques and equipment, NMR, surgical precautions, pain control, family ed.   Goals are: min assist. 9. OT will assess and treat for/with: ADL's, functional mobility, safety, upper extremity strength, adaptive techniques and equipment, NMR, back precautions, pain control, family ed.   Goals are: min assist. Therapy may not proceed with showering this patient. 10. Case Management and Social Worker will assess and treat for psychological issues and discharge planning. 11. Team conference will be held weekly to assess progress toward goals and to determine barriers to discharge. 12. Patient will receive at least 3 hours of therapy per day at least 5 days per week. 13. ELOS: 12-16 days       14. Prognosis:  excellent    I have personally performed a face to face diagnostic evaluation of this patient and formulated the key components of the plan.  Additionally, I  have personally reviewed laboratory data, imaging studies, as well as relevant notes and concur with the physician assistant's documentation above.  Meredith Staggers, MD, [facility]   Lavon Paganini [place], PA-C 02/21/2018

## 2018-02-21 NOTE — Discharge Summary (Signed)
Physician Discharge Summary  Patient ID: Ashley [name] MRN: [id number] DOB/AGE: 01-24-1976 42 y.o.  Admit date: 02/17/2018 Discharge date: 02/21/2018  Admission Diagnoses: Lumbar spondylosis and stenosis with radiculopathy L3-4 L4-5 and L5-S1  Discharge Diagnoses: Lumbar spondylosis and stenosis with radiculopathy L3-4 L4-5 L5-S1 Active Problems:   Status post surgery   Lumbar radiculopathy, chronic   PCOS (polycystic ovarian syndrome)   Morbid obesity (HCC)   Depression   Generalized OA   Postoperative pain   Acute blood loss anemia   Discharged Condition: fair  Hospital Course: Patient was admitted to undergo anterolateral decompression at L3-4 and L4-5 with anterior lumbar interbody arthrodesis at L5-S1.  She underwent pedicle screw fixation from L3 to the sacrum.  Postoperatively she has had a right lumbar radiculopathy with iliopsoas and quadriceps weakness.  She has been advised regarding inpatient rehabilitation is now being transferred for the this treatment.  Consults: rehabilitation medicine, vascular surgery  Significant Diagnostic Studies: None  Treatments: surgery: Anterior lumbar decompression L5-S1 with arthrodesis using peek titanium spacer.  Anterolateral decompression L3-4 and L4-5 with ex lift spacer.  Percutaneous pedicle screw fixation L3 to sacrum.  Discharge Exam: Blood pressure (!) 152/90, pulse 90, temperature 98.4 F (36.9 C), temperature source Oral, resp. rate 18, height 5\' 11"  (1.803 m), weight 118.8 kg (262 lb), last menstrual period 01/28/2018, SpO2 100 %. Incision/Wound: Incision is clean and dry on the flank and the lumbar spine.  Motor function reveals weakness in the iliopsoas and quad on the right side being graded 2 out of 5  Disposition: Discharge disposition: [place] Not Defined       Discharge Instructions    Diet - low sodium heart healthy   Complete by:  As directed    Incentive spirometry RT   Complete  by:  As directed    Increase activity slowly   Complete by:  As directed      Allergies as of 02/21/2018      Reactions   Gadolinium Derivatives Nausea And Vomiting   Pt describes this happens every time she gets gado even with slow injection   Codeine Itching, Nausea Only   Cymbalta [duloxetine Hcl] Other (See Comments)   Altered mental status      Medication List    TAKE these medications   ALPRAZolam 1 MG tablet Commonly known as:  XANAX Take 1 mg by mouth daily as needed for anxiety.   baclofen 10 MG tablet Commonly known as:  LIORESAL Take 10 mg by mouth 3 (three) times daily as needed for muscle spasms.   BELVIQ XR 20 MG Tb24 Generic drug:  Lorcaserin HCl ER Take 20 mg by mouth daily.   buPROPion 300 MG 24 hr tablet Commonly known as:  WELLBUTRIN XL Take 300 mg daily by mouth.   cetirizine 10 MG tablet Commonly known as:  ZYRTEC Take 10 mg by mouth as needed.   docusate sodium 100 MG capsule Commonly known as:  COLACE Take 100 mg by mouth 2 (two) times daily as needed for mild constipation.   EPINEPHrine 0.3 mg/0.3 mL Soaj injection Commonly known as:  EPI-PEN Inject 0.3 mg into the muscle as needed (allergic reaction).   furosemide 20 MG tablet Commonly known as:  LASIX Take 20 mg by mouth daily as needed for fluid.   gabapentin 300 MG capsule Commonly known as:  NEURONTIN Take 600 mg by mouth 4 (four) times daily.   ibuprofen 600 MG tablet Commonly known as:  ADVIL,MOTRIN  Take 600 mg by mouth 2 (two) times daily as needed for pain.   losartan-hydrochlorothiazide 50-12.5 MG tablet Commonly known as:  HYZAAR Take 1 tablet by mouth daily.   methocarbamol 500 MG tablet Commonly known as:  ROBAXIN Take 500 mg by mouth every 6 (six) hours as needed for muscle spasms.   multivitamin with minerals tablet Take 1 tablet daily by mouth.   nabumetone 500 MG tablet Commonly known as:  RELAFEN Take 500 mg 2 (two) times daily by mouth.    oxyCODONE-acetaminophen 10-325 MG tablet Commonly known as:  PERCOCET Take 1 tablet every 6 (six) hours as needed by mouth for pain.   sertraline 50 MG tablet Commonly known as:  ZOLOFT Take 50-100 mg by mouth See admin instructions. Take 50 mg once daily for 7 days then take 100 mg once daily        Signed: Kairee Isa J 02/21/2018, 1:48 PM

## 2018-02-21 NOTE — H&P (Signed)
Physical Medicine and Rehabilitation Admission H&P    Chief complaint: Back pain  HPI: Ashley [name] is a 42 year old right-handed female with past history of polycystic ovarian syndrome, hypertension, morbid obesity, bilateral TKA and right THA, lumbar spondylosis with chronic radicular back pain with herniated nucleus pulposus at L4-5 and back surgery 2017.  Per chart review patient lives with 25-year-old and 57 year old son.  Reported to be independent prior to admission but sedentary.  Son can provide assistance as needed.  One level home with one-step to entry.  Presented 02/17/2018 with progressive back pain radiating to the lower extremities.  X-rays and imaging as well as recent myelogram showed degenerative disc disease L3-4, 4-5 and L5-S1 with chronic lumbar radiculopathy.  Underwent anterior exposure of L5-S1 per Dr. Donnetta Hutching as well as anterolateral decompression L3-4 and L4-5 as well as percutaneous fixation L3 to the sacrum 02/17/2018 per Dr. Ellene Route.  Hospital course pain management.  Acute blood loss anemia 9.4 and monitored.  Back brace when out of bed.  Physical and occupational therapy evaluations completed with recommendations of physical medicine rehab consult.  Patient was admitted for a comprehensive rehab program.  Review of Systems  Constitutional: Negative for chills and fever.  HENT: Negative for hearing loss.   Eyes: Negative for blurred vision and double vision.  Respiratory: Negative for cough and shortness of breath.   Cardiovascular: Positive for leg swelling. Negative for chest pain and palpitations.  Gastrointestinal: Positive for constipation. Negative for nausea and vomiting.  Genitourinary: Negative for dysuria, flank pain and hematuria.  Musculoskeletal: Positive for back pain, joint pain and myalgias.  Skin: Negative for rash.  Neurological: Negative for seizures.  Psychiatric/Behavioral: Positive for depression.  All other systems reviewed and are  negative.  Past Medical History:  Diagnosis Date  . Arthritis    knees, back  . 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 Complication of anesthesia   . Depression   . History of pneumonia   . Hypertension   . Lumbar spondylosis   . PCOS (polycystic ovarian syndrome)   . Pneumonia    2016  . PONV (postoperative nausea and vomiting)    Past Surgical History:  Procedure Laterality Date  . ANKLE SURGERY     x 2  . BACK SURGERY     2017  discectomy  . CARPAL TUNNEL RELEASE Left 07/04/2014   Procedure: LEFT CARPAL TUNNEL RELEASE;  Surgeon: Ninetta Lights, MD;  Location: [place];  Service: Orthopedics;  Laterality: Left;  . CARPAL TUNNEL RELEASE Right 08/08/2014   Procedure: RIGHT CARPAL TUNNEL RELEASE;  Surgeon: Ninetta Lights, MD;  Location: [place];  Service: Orthopedics;  Laterality: Right;  . DILATION AND CURETTAGE OF UTERUS    . DILATION AND EVACUATION  04/02/2011   Procedure: DILATATION AND EVACUATION (D&E);  Surgeon: Luz Lex, MD;  Location: [facility];  Service: Gynecology;  Laterality: N/A;  dvt left mid thigh  . DORSAL COMPARTMENT RELEASE Left 07/04/2014   Procedure: LEFT DEQUERVAINS;  Surgeon: Ninetta Lights, MD;  Location: [place];  Service: Orthopedics;  Laterality: Left;  . ENDOSCOPIC PLANTAR FASCIOTOMY Left 02/01/2002  . HYSTEROSCOPY W/D&C  07/17/2010   with exc. endometrial polyps  . KNEE ARTHROSCOPY Right 03/07/2003; 01/14/2005; 08/31/2006  . KNEE ARTHROSCOPY Left 03/21/2003; 11/26/2004  . SHOULDER ARTHROSCOPY Right   . TOE SURGERY Left 01/10/2003   claw toe correction 2nd, 3rd, 4th toes  . TOTAL HIP ARTHROPLASTY  Right 04/21/2016  . TOTAL HIP ARTHROPLASTY Right 04/21/2016   Procedure: TOTAL HIP ARTHROPLASTY ANTERIOR APPROACH;  Surgeon: Ninetta Lights, MD;  Location: [facility];  Service: Orthopedics;  Laterality: Right;  . TOTAL KNEE ARTHROPLASTY Left 01/21/2010  . TOTAL KNEE ARTHROPLASTY Right 08/26/2008   Family  History  Problem Relation Age of Onset  . Cancer Maternal Grandmother        UTERINE  . Cancer Paternal Grandmother        breast  . Diabetes Father   . Cancer Father        COLON  . Heart disease Father   . Kidney disease Father        dialysis  . Diabetes Sister    Social History:  reports that she has never smoked. She has never used smokeless tobacco. She reports that she does not drink alcohol or use drugs. Allergies:  Allergies  Allergen Reactions  . Gadolinium Derivatives Nausea And Vomiting    Pt describes this happens every time she gets gado even with slow injection  . Codeine Itching and Nausea Only  . Cymbalta [Duloxetine Hcl] Other (See Comments)    Altered mental status   Medications Prior to Admission  Medication Sig Dispense Refill  . ALPRAZolam (XANAX) 1 MG tablet Take 1 mg by mouth daily as needed for anxiety.    . baclofen (LIORESAL) 10 MG tablet Take 10 mg by mouth 3 (three) times daily as needed for muscle spasms.     Marland Kitchen buPROPion (WELLBUTRIN XL) 300 MG 24 hr tablet Take 300 mg daily by mouth.    . cetirizine (ZYRTEC) 10 MG tablet Take 10 mg by mouth as needed.     . docusate sodium (COLACE) 100 MG capsule Take 100 mg by mouth 2 (two) times daily as needed for mild constipation.    Marland Kitchen EPINEPHrine 0.3 mg/0.3 mL IJ SOAJ injection Inject 0.3 mg into the muscle as needed (allergic reaction).   0  . furosemide (LASIX) 20 MG tablet Take 20 mg by mouth daily as needed for fluid.    Marland Kitchen gabapentin (NEURONTIN) 300 MG capsule Take 600 mg by mouth 4 (four) times daily.     Marland Kitchen ibuprofen (ADVIL,MOTRIN) 600 MG tablet Take 600 mg by mouth 2 (two) times daily as needed for pain.  2  . Lorcaserin HCl ER (BELVIQ XR) 20 MG TB24 Take 20 mg by mouth daily.    Marland Kitchen losartan-hydrochlorothiazide (HYZAAR) 50-12.5 MG tablet Take 1 tablet by mouth daily. 90 tablet 3  . methocarbamol (ROBAXIN) 500 MG tablet Take 500 mg by mouth every 6 (six) hours as needed for muscle spasms.    . Multiple  Vitamins-Minerals (MULTIVITAMIN WITH MINERALS) tablet Take 1 tablet daily by mouth.    . nabumetone (RELAFEN) 500 MG tablet Take 500 mg 2 (two) times daily by mouth.    . oxyCODONE-acetaminophen (PERCOCET) 10-325 MG tablet Take 1 tablet every 6 (six) hours as needed by mouth for pain.    Marland Kitchen sertraline (ZOLOFT) 50 MG tablet Take 50-100 mg by mouth See admin instructions. Take 50 mg once daily for 7 days then take 100 mg once daily      Drug Regimen Review Drug regimen was reviewed and remains appropriate with no significant issues identified  Home: Home Living Family/patient expects to be discharged to:: Private residence Living Arrangements: Children Available Help at Discharge: Family, Available 24 hours/day Type of Home: House Home Access: Level entry Home Layout: One level Bathroom Shower/Tub: Tub/shower unit  Bathroom Toilet: Handicapped height Bathroom Accessibility: Yes Home Equipment: Environmental consultant - 2 wheels, Cane - single point, Hand held shower head, Shower seat Additional Comments: also has a 43 year old that relies on her   Functional History: Prior Function Level of Independence: Independent  Functional Status:  Mobility: Bed Mobility Overal bed mobility: Needs Assistance Bed Mobility: Rolling, Sidelying to Sit Rolling: Mod assist, +2 for safety/equipment(vc for sequencing) Sidelying to sit: Mod assist General bed mobility comments: received on BSC Transfers Overall transfer level: Needs assistance Equipment used: Rolling walker (2 wheeled) Transfers: Sit to/from Stand, Stand Pivot Transfers Sit to Stand: Min assist, +2 safety/equipment, From elevated surface(blocking out RLE; bed at very high elevation) Stand pivot transfers: (assist to progress RLE, vc for sequencing) General transfer comment: performed x2 during session with increased time and effort. VCs for hand placement and positioning during controlled descent to chair.  Cues for  precuations Ambulation/Gait Ambulation/Gait assistance: Min assist Ambulation Distance (Feet): 10 Feet Assistive device: Rolling walker (2 wheeled) Gait Pattern/deviations: Step-to pattern General Gait Details: VCs for sequencing, able to perform turn with mobility today, heavy reliance on RW, increased assist with fatigue.  VCs for quad setting during loading response of RLE Gait velocity: decreased Gait velocity interpretation: <1.31 ft/sec, indicative of household ambulator    ADL: ADL Overall ADL's : Needs assistance/impaired Eating/Feeding: Set up, Sitting Grooming: Wash/dry face, Oral care, Standing, Minimal assistance Grooming Details (indicate cue type and reason): at sink Upper Body Bathing: Maximal assistance, Sitting Lower Body Bathing: Maximal assistance Upper Body Dressing : Maximal assistance Upper Body Dressing Details (indicate cue type and reason): to don hospital gown Lower Body Dressing: Minimal assistance, Adhering to back precautions, With adaptive equipment Lower Body Dressing Details (indicate cue type and reason): min A +2 sit<>stand; pt able to doff and don her socks with reacher and sock aid Toilet Transfer: Minimal assistance, +2 for safety/equipment, +2 for physical assistance, Ambulation, BSC, RW Toilet Transfer Details (indicate cue type and reason): simulated through recliner transfer Toileting- Clothing Manipulation and Hygiene: Moderate assistance Toileting - Clothing Manipulation Details (indicate cue type and reason): min A +2 sit<>stand; could do her clothing management but needed A for hygiene Functional mobility during ADLs: +2 for physical assistance, +2 for safety/equipment, Moderate assistance, Rolling walker(SPT only)  Cognition: Cognition Overall Cognitive Status: Within Functional Limits for tasks assessed Orientation Level: Oriented X4 Cognition Arousal/Alertness: Awake/alert Behavior During Therapy: Flat affect Overall Cognitive  Status: Within Functional Limits for tasks assessed  Physical Exam: Blood pressure 128/77, pulse 94, temperature 98.2 F (36.8 C), temperature source Oral, resp. rate 17, last menstrual period 01/28/2018, SpO2 100 %. Physical Exam  Vitals reviewed. Constitutional: She appears well-developed.  Obese  HENT:  Head: Normocephalic and atraumatic.  Neck: Normal range of motion.  Cardiovascular: Normal rate and regular rhythm. Exam reveals no gallop and no friction rub.  No murmur heard. Respiratory: Effort normal. No respiratory distress. She has no rales.  GI: Soft. Bowel sounds are normal. She exhibits no distension. There is tenderness.  Musculoskeletal:  Pt tender near operative sites  Neurological: She is alert. She has normal reflexes. No cranial nerve deficit.  UE motor 5/5 prox to distal. RLE: 3-4/5 HE with pain inhibition. 1-2/5 HAB and KE. ADF/PF 4/5. LLE: 3-4/5 HF, KE and 5/5 ADF/PF. Decreased sensation over anterior right thigh. DTR's tr in bilateral LE's.   Skin:  Flank/abdominal incisions clean and intact.   Psychiatric: She has a normal mood and affect. Her behavior is normal.  Medical Problem List and Plan: 1.  Decreased functional mobility secondary to lumbar spondylosis with radiculopathy.S/P decompression L3-4 and L4-5 with anterior exposure of L5-S1 as well as percutaneous fixation L3 to the sacrum 02/17/2018. Appears to have significant right L3 radiculopathy.  Back brace when out of bed  -admit to inpatient rehab 2.  DVT Prophylaxis/Anticoagulation: SCDs.  Check vascular study 3. Pain Management: Neurontin 600 mg 4 times daily, baclofen 20 mg 4 times daily, hydrocodone and Robaxin as needed 4. Mood: Wellbutrin XL 300 mg daily, Zoloft 50 mg daily, Xanax 1 mg daily as needed 5. Neuropsych: This patient is capable of making decisions on her own behalf. 6. Skin/Wound Care: Routine skin checks 7. Fluids/Electrolytes/Nutrition: Routine in and outs with  follow-up chemistries 8.  Acute blood loss anemia.  Follow-up CBC 9.  Hypertension.  Cozaar 50 mg daily, HCTZ 12.5 mg daily. 10.  Obesity.  262 pounds.  Follow-up dietary 11.  Osteoarthritis.  History of bilateral total knee replacement as well as right THA 12.  Constipation.  Laxative assistance  Post Admission Physician Evaluation: 1. Functional deficits secondary  to Lumbar spondylosis with radic s/p decompression and fusion. 2. Patient is admitted to receive collaborative, interdisciplinary care between the physiatrist, rehab nursing staff, and therapy team. 3. Patient's level of medical complexity and substantial therapy needs in context of that medical necessity cannot be provided at a lesser intensity of care such as a SNF. 4. Patient has experienced substantial functional loss from his/her baseline which was documented above under the "Functional History" and "Functional Status" headings.  Judging by the patient's diagnosis, physical exam, and functional history, the patient has potential for functional progress which will result in measurable gains while on inpatient rehab.  These gains will be of substantial and practical use upon discharge  in facilitating mobility and self-care at the household level. 5. Physiatrist will provide 24 hour management of medical needs as well as oversight of the therapy plan/treatment and provide guidance as appropriate regarding the interaction of the two. 6. The Preadmission Screening has been reviewed and patient status is unchanged unless otherwise stated above. 7. 24 hour rehab nursing will assist with bladder management, bowel management, safety, skin/wound care, disease management, medication administration, pain management and patient education  and help integrate therapy concepts, techniques,education, etc. 8. PT will assess and treat for/with: Lower extremity strength, range of motion, stamina, balance, functional mobility, safety, adaptive  techniques and equipment, NMR, surgical precautions, pain control, family ed.   Goals are: min assist. 9. OT will assess and treat for/with: ADL's, functional mobility, safety, upper extremity strength, adaptive techniques and equipment, NMR, back precautions, pain control, family ed.   Goals are: min assist. Therapy may not proceed with showering this patient. 10. Case Management and Social Worker will assess and treat for psychological issues and discharge planning. 11. Team conference will be held weekly to assess progress toward goals and to determine barriers to discharge. 12. Patient will receive at least 3 hours of therapy per day at least 5 days per week. 13. ELOS: 12-16 days       14. Prognosis:  excellent    I have personally performed a face to face diagnostic evaluation of this patient and formulated the key components of the plan.  Additionally, I have personally reviewed laboratory data, imaging studies, as well as relevant notes and concur with the physician assistant's documentation above.  Meredith Staggers, MD, Mellody Drown   Lavon Paganini [place], PA-C 02/21/2018

## 2018-02-21 NOTE — Care Management Note (Signed)
Case Management Note  Patient Details  Name: Ashley [name] MRN: [id number] Date of Birth: 04/04/1976  Subjective/Objective:                    Action/Plan: Pt discharging to CIR today. CM signing off.    Expected Discharge Date:  02/21/18               Expected Discharge Plan:  [place]  In-House Referral:     Discharge planning Services     Post Acute Care Choice:    Choice offered to:     DME Arranged:    DME Agency:     HH Arranged:    HH Agency:     Status of Service:  Completed, signed off  If discussed at [employer], dates discussed:    Additional Comments:  Pollie Friar, RN 02/21/2018, 10:12 AM

## 2018-02-21 NOTE — IPOC Note (Signed)
Overall Plan of Care [facility]) Patient Details Name: Ashley [name] MRN: [id number] DOB: 03/18/1976  Admitting Diagnosis: Lumbar radiculopathy  Hospital Problems: Active Problems:   Lumbar radiculopathy   Neuropathic pain   Therapeutic opioid induced constipation   Urinary retention     Functional Problem List: Nursing Bladder, Bowel, Motor, Pain, Safety, Skin Integrity  PT Balance, Behavior, Endurance, Edema, Nutrition, Pain, Safety, Sensory, Skin Integrity  OT Balance, Endurance, Motor, Pain, Safety  SLP    TR         Basic ADL's: OT Grooming, Bathing, Dressing, Toileting     Advanced  ADL's: OT Simple Meal Preparation     Transfers: PT Bed Mobility, Bed to Chair, Car, Furniture, Floor  OT Toilet, Metallurgist: PT Ambulation, Emergency planning/management officer     Additional Impairments: OT None  SLP        TR      Anticipated Outcomes Item Anticipated Outcome  Self Feeding    Swallowing      Basic self-care  Media planner Transfers Supervision  Bowel/Bladder  Patient will be continent of bowl and bladder   Transfers  supervision assist with LRAD   Locomotion  Ambulatory Supervision assist at household level with LRAD   Communication     Cognition     Pain  patient will be pain free or pain less than 3  Safety/Judgment  Patient will be free from falls and adhere to safety plan   Therapy Plan: PT Intensity: Minimum of 1-2 x/day ,45 to 90 minutes PT Frequency: 5 out of 7 days PT Duration Estimated Length of Stay: 10-14days  OT Intensity: Minimum of 1-2 x/day, 45 to 90 minutes OT Frequency: 5 out of 7 days OT Duration/Estimated Length of Stay: 9-12 days      Team Interventions: Nursing Interventions    PT interventions Ambulation/gait training, Training and development officer, Disease management/prevention, Discharge planning, Community reintegration, DME/adaptive equipment instruction, Functional electrical  stimulation, Functional mobility training, Patient/family education, Pain management, Neuromuscular re-education, Psychosocial support, Skin care/wound management, Splinting/orthotics, Therapeutic Exercise, Therapeutic Activities, Stair training, UE/LE Strength taining/ROM, UE/LE Coordination activities, Visual/perceptual remediation/compensation, Wheelchair propulsion/positioning  OT Interventions Training and development officer, Academic librarian, Discharge planning, Disease mangement/prevention, Engineer, drilling, Functional mobility training, Neuromuscular re-education, Pain management, Patient/family education, Psychosocial support, Self Care/advanced ADL retraining, Skin care/wound managment, Therapeutic Activities, Therapeutic Exercise, UE/LE Strength taining/ROM, Visual/perceptual remediation/compensation  SLP Interventions    TR Interventions    SW/CM Interventions Discharge Planning, Psychosocial Support, Patient/Family Education   Barriers to Discharge MD  Medical stability, Wound care and Weight  Nursing      PT Inaccessible home environment    OT      SLP      SW       Team Discharge Planning: Destination: PT-Home ,OT- Home , SLP-  Projected Follow-up: PT-Home health PT, OT-  24 hour supervision/assistance, Home health OT, SLP-  Projected Equipment Needs: PT-Rolling walker with 5" wheels, Wheelchair cushion (measurements), Wheelchair (measurements), To be determined, OT- None recommended by OT, SLP-  Equipment Details: PT- , OT-  Patient/family involved in discharge planning: PT- Patient,  OT-Patient, Family member/caregiver, SLP-   MD ELOS: 10-14 days. Medical Rehab Prognosis:  Good  Assessment: 42 year old right-handed female with past history of polycystic ovarian syndrome, hypertension, morbid obesity, bilateral TKA and right THA, lumbar spondylosis with chronic radicular back pain with herniated nucleus pulposus at L4-5 and back surgery  2017.Presented 02/17/2018 with progressive back pain radiating  to the lower extremities. X-rays and imaging as well as recent myelogram showed degenerative disc disease L3-4, 4-5 and L5-S1 with chronic lumbar radiculopathy. Underwent anterior exposure of L5-S1 per Dr. Donnetta Hutching as well as anterolateraldecompression L3-4 and L4-5 as well as percutaneous fixation L3 to the sacrum 02/17/2018 per Dr. Ellene Route.Hospital course pain management. Acute blood loss anemia monitored. Patient with resulting functional deficits with mobility, endurance, transfers, self-care.  Will set goals for Supervision with PT/OT.   See Team Conference Notes for weekly updates to the plan of care

## 2018-02-21 NOTE — Progress Notes (Signed)
Retta Diones, RN  Rehab Admission Coordinator  Physical Medicine and Rehabilitation  PMR Pre-admission  Signed  Date of Service:  02/21/2018 10:43 AM       Related encounter: Admission (Discharged) from 02/17/2018 in [facility]      Signed            Show:Clear all [x] Manual[x] Template[x] Copied  Added by: [x] Karl Bales Evalee Mutton, RN   [] Hover for details   PMR Admission Coordinator Pre-Admission Assessment  Patient: Ashley [name] is an 42 y.o., female MRN: [id number] DOB: [birth year] Height: 5' 11"  (180.3 cm) Weight: 118.8 kg (262 lb)                                                                                                                                                  Insurance Information HMO:      PPO:  Yes     PCP:       IPA:       80/20:       OTHER:  Group # [id number] PRIMARY: BCBS      Policy#: [id number]      Subscriber: patient CM Name: Elspeth Cho      Phone#: [phone]     Fax#: [phone] Pre-Cert#: [id number] from 6/4 to 03/07/18 with update due on 03/06/18      Employer:   Benefits:  Phone #: [phone]     Name:  On line portal Eff. Date: 09/20/17     Deduct:  $250 (met $233.99)      Out of Pocket Max: $600 (met $600)      Life Max: N/A CIR: 70%/ 30%      SNF: 70% with 60 days max Outpatient: 30 visits max     Co-Pay: $20/visit Home Health: 70% with medical necessity      Co-Pay: 30% DME: 70%     Co-Pay: 30% Providers: in network  Emergency [place]    Name Relation Home Work [place] Sister [phone]     Fredrik Cove   [phone]   Doty,Adele Mother [phone]  [phone]     Current Medical History  Patient Admitting Diagnosis:  L3-sacrum fixation  History of Present Illness: a 42 year old right-handed female with past history of polycystic ovarian syndrome, hypertension, morbid obesity, bilateral TKA and right THA,  lumbar spondylosis with chronic radicular back pain with herniated nucleus pulposus at L4-5 and back surgery 2017.Per chart review patient lives with 63-year-old and 32 year old son. Reported to be independent prior to admission but sedentary. Son can provide assistance as needed. One level home with one-step to entry. Presented 02/17/2018 with progressive back pain radiating to the lower extremities. X-rays and imaging as well as recent myelogram showed degenerative disc disease L3-4, 4-5 and L5-S1 with chronic lumbar  radiculopathy. Underwent anterior exposure of L5-S1 per Dr. Donnetta Hutching as well as anterolateraldecompression L3-4 and L4-5 as well as percutaneous fixation L3 to the sacrum 02/17/2018 per Dr. Ellene Route. Hospital course pain management. Acute blood loss anemia 9.4 and monitored. Back brace when out of bed. Physical and occupational therapy evaluations completed with recommendations of physical medicine rehab consult. Patient to be admitted for a comprehensive inpatient rehab program.  Past Medical History      Past Medical History:  Diagnosis Date  . Arthritis    knees, back  . 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 Complication of anesthesia   . Depression   . History of pneumonia   . Hypertension   . Lumbar spondylosis   . PCOS (polycystic ovarian syndrome)   . Pneumonia    2016  . PONV (postoperative nausea and vomiting)     Family History  family history includes Cancer in her father, maternal grandmother, and paternal grandmother; Diabetes in her father and sister; Heart disease in her father; Kidney disease in her father.  Prior Rehab/Hospitalizations: No previous rehab.  Has the patient had major surgery during 100 days prior to admission? No  Current Medications   Current Facility-Administered Medications:  .  0.9 %  sodium chloride infusion, 250 mL, Intravenous, Continuous, Kristeen Miss, MD, Stopped at 02/17/18 2330 .   acetaminophen (TYLENOL) tablet 650 mg, 650 mg, Oral, Q4H PRN **OR** acetaminophen (TYLENOL) suppository 650 mg, 650 mg, Rectal, Q4H PRN, Kristeen Miss, MD .  ALPRAZolam Duanne Moron) tablet 1 mg, 1 mg, Oral, Daily PRN, Kristeen Miss, MD .  alum & mag hydroxide-simeth (MAALOX/MYLANTA) 200-200-20 MG/5ML suspension 30 mL, 30 mL, Oral, Q6H PRN, Kristeen Miss, MD .  baclofen (LIORESAL) tablet 20 mg, 20 mg, Oral, QID, Kristeen Miss, MD, 20 mg at 02/21/18 0825 .  bisacodyl (DULCOLAX) suppository 10 mg, 10 mg, Rectal, Daily PRN, Kristeen Miss, MD .  buPROPion (WELLBUTRIN XL) 24 hr tablet 300 mg, 300 mg, Oral, Daily, Kristeen Miss, MD, 300 mg at 02/21/18 0825 .  docusate sodium (COLACE) capsule 100 mg, 100 mg, Oral, BID PRN, Kristeen Miss, MD .  docusate sodium (COLACE) capsule 100 mg, 100 mg, Oral, BID, Kristeen Miss, MD, 100 mg at 02/21/18 5038 .  furosemide (LASIX) tablet 20 mg, 20 mg, Oral, Daily PRN, Kristeen Miss, MD .  gabapentin (NEURONTIN) capsule 600 mg, 600 mg, Oral, QID, Kristeen Miss, MD, 600 mg at 02/21/18 0824 .  losartan (COZAAR) tablet 50 mg, 50 mg, Oral, Daily, 50 mg at 02/21/18 0824 **AND** hydrochlorothiazide (MICROZIDE) capsule 12.5 mg, 12.5 mg, Oral, Daily, Kristeen Miss, MD, 12.5 mg at 02/21/18 0825 .  HYDROcodone-acetaminophen (NORCO/VICODIN) 5-325 MG per tablet 2 tablet, 2 tablet, Oral, Q4H PRN, Kristeen Miss, MD, 2 tablet at 02/20/18 1755 .  lactated ringers infusion, , Intravenous, Continuous, Kristeen Miss, MD, Stopped at 02/17/18 2331 .  loratadine (CLARITIN) tablet 10 mg, 10 mg, Oral, Daily, Kristeen Miss, MD, 10 mg at 02/21/18 0825 .  menthol-cetylpyridinium (CEPACOL) lozenge 3 mg, 1 lozenge, Oral, PRN **OR** phenol (CHLORASEPTIC) mouth spray 1 spray, 1 spray, Mouth/Throat, PRN, Kristeen Miss, MD .  methocarbamol (ROBAXIN) tablet 500 mg, 500 mg, Oral, Q6H PRN, 500 mg at 02/19/18 1039 **OR** methocarbamol (ROBAXIN) 500 mg in dextrose 5 % 50 mL IVPB, 500 mg, Intravenous, Q6H PRN, Kristeen Miss, MD .  methocarbamol (ROBAXIN) tablet 500 mg, 500 mg, Oral, Q6H PRN, Kristeen Miss, MD .  morphine 4 MG/ML injection 4 mg, 4 mg, Intravenous, Q2H PRN,  Kristeen Miss, MD, 4 mg at 02/21/18 6073 .  ondansetron (ZOFRAN) tablet 4 mg, 4 mg, Oral, Q6H PRN **OR** ondansetron (ZOFRAN) injection 4 mg, 4 mg, Intravenous, Q6H PRN, Kristeen Miss, MD .  oxyCODONE-acetaminophen (PERCOCET/ROXICET) 5-325 MG per tablet 1 tablet, 1 tablet, Oral, Q4H PRN, 1 tablet at 02/20/18 0451 **AND** oxyCODONE (Oxy IR/ROXICODONE) immediate release tablet 5 mg, 5 mg, Oral, Q4H PRN, Pierce, Dwayne A, RPH, 5 mg at 02/20/18 0451 .  polyethylene glycol (MIRALAX / GLYCOLAX) packet 17 g, 17 g, Oral, Daily PRN, Kristeen Miss, MD .  senna (SENOKOT) tablet 8.6 mg, 1 tablet, Oral, BID, Kristeen Miss, MD, 8.6 mg at 02/21/18 0825 .  sertraline (ZOLOFT) tablet 50 mg, 50 mg, Oral, Daily, Kristeen Miss, MD, 50 mg at 02/21/18 0826 .  sodium chloride flush (NS) 0.9 % injection 3 mL, 3 mL, Intravenous, Q12H, Kristeen Miss, MD, 3 mL at 02/21/18 0827 .  sodium chloride flush (NS) 0.9 % injection 3 mL, 3 mL, Intravenous, PRN, Kristeen Miss, MD .  sodium phosphate (FLEET) 7-19 GM/118ML enema 1 enema, 1 enema, Rectal, Once PRN, Kristeen Miss, MD  Patients Current Diet:       Diet Order           Diet - low sodium heart healthy        Diet regular Room service appropriate? Yes; Fluid consistency: Thin  Diet effective now          Precautions / Restrictions Precautions Precautions: Back, Fall Precaution Booklet Issued: No Precaution Comments: reviewed verbally with Pt and son Restrictions Weight Bearing Restrictions: No   Has the patient had 2 or more falls or a fall with injury in the past year?No  Prior Activity Level Community (5-7x/wk): Went out daily, was driving.  Home Assistive Devices / Equipment Home Equipment: Environmental consultant - 2 wheels, Cane - single point, Hand held shower head, Shower seat  Prior Device Use:  Indicate devices/aids used by the patient prior to current illness, exacerbation or injury? None  Prior Functional Level Prior Function Level of Independence: Independent  Self Care: Did the patient need help bathing, dressing, using the toilet or eating?  Independent  Indoor Mobility: Did the patient need assistance with walking from room to room (with or without device)? Independent  Stairs: Did the patient need assistance with internal or external stairs (with or without device)? Independent  Functional Cognition: Did the patient need help planning regular tasks such as shopping or remembering to take medications? Independent  Current Functional Level Cognition  Overall Cognitive Status: Within Functional Limits for tasks assessed Orientation Level: Oriented X4    Extremity Assessment (includes Sensation/Coordination)  Upper Extremity Assessment: Generalized weakness  Lower Extremity Assessment: RLE deficits/detail RLE Deficits / Details: assymetrical weakness, noted 1-2 /5 upon testing various motions. Active contraction noted. Pt reports muscle spasms present RLE Coordination: decreased fine motor, decreased gross motor    ADLs  Overall ADL's : Needs assistance/impaired Eating/Feeding: Set up, Sitting Grooming: Wash/dry face, Oral care, Standing, Minimal assistance Grooming Details (indicate cue type and reason): at sink Upper Body Bathing: Maximal assistance, Sitting Lower Body Bathing: Maximal assistance Upper Body Dressing : Maximal assistance Upper Body Dressing Details (indicate cue type and reason): to don hospital gown Lower Body Dressing: Minimal assistance, Adhering to back precautions, With adaptive equipment Lower Body Dressing Details (indicate cue type and reason): min A +2 sit<>stand; pt able to doff and don her socks with reacher and sock aid Toilet Transfer: Minimal assistance, +2 for safety/equipment, +  2 for physical assistance, Ambulation, BSC,  RW Toilet Transfer Details (indicate cue type and reason): simulated through recliner transfer Merrill and Hygiene: Moderate assistance Toileting - Clothing Manipulation Details (indicate cue type and reason): min A +2 sit<>stand; could do her clothing management but needed A for hygiene Functional mobility during ADLs: +2 for physical assistance, +2 for safety/equipment, Moderate assistance, Rolling walker(SPT only)    Mobility  Overal bed mobility: Needs Assistance Bed Mobility: Rolling, Sidelying to Sit Rolling: Mod assist, +2 for safety/equipment(vc for sequencing) Sidelying to sit: Mod assist General bed mobility comments: received on BSC    Transfers  Overall transfer level: Needs assistance Equipment used: Rolling walker (2 wheeled) Transfers: Sit to/from Stand, Stand Pivot Transfers Sit to Stand: Min assist, +2 safety/equipment, From elevated surface(blocking out RLE; bed at very high elevation) Stand pivot transfers: (assist to progress RLE, vc for sequencing) General transfer comment: performed x2 during session with increased time and effort. VCs for hand placement and positioning during controlled descent to chair.  Cues for precuations    Ambulation / Gait / Stairs / Wheelchair Mobility  Ambulation/Gait Ambulation/Gait assistance: Min Wellsite geologist (Feet): 10 Feet Assistive device: Rolling walker (2 wheeled) Gait Pattern/deviations: Step-to pattern General Gait Details: VCs for sequencing, able to perform turn with mobility today, heavy reliance on RW, increased assist with fatigue.  VCs for quad setting during loading response of RLE Gait velocity: decreased Gait velocity interpretation: <1.31 ft/sec, indicative of household ambulator    Posture / Balance Balance Overall balance assessment: Needs assistance Sitting-balance support: Bilateral upper extremity supported, Feet supported Sitting balance-Leahy Scale:  Good Standing balance support: Bilateral upper extremity supported, During functional activity Standing balance-Leahy Scale: Poor Standing balance comment: able to release Rw breifly today for ADL performance x3. Some noted improvements in RLE strength in static standing.     Special needs/care consideration BiPAP/CPAP No CPM No Continuous Drip IV No Dialysis No      Life Vest No Oxygen No Special Bed No Trach Size No Wound Vac (area) No      Skin Back incision with dressing in place                             Bowel mgmt: Last BM 02/21/18 Bladder mgmt: Urinary catheter Diabetic mgmt No    Previous Home Environment Living Arrangements: Children Available Help at Discharge: Family, Available 24 hours/day Type of Home: House Home Layout: One level Home Access: Level entry Bathroom Shower/Tub: Chiropodist: Handicapped height Bathroom Accessibility: Yes Additional Comments: also has a 43 year old that relies on her  Discharge Living Setting Plans for Discharge Living Setting: House, Lives with (comment)(Lives with 89 yo son, 62 yo and her roommate.) Type of Home at Discharge: House Discharge Home Layout: One level Discharge Home Access: Stairs to enter Entrance Stairs-Number of Steps: 1/2 step to Heil Patient Roles: Parent, Other (Comment)(Has a 40 yo son, 66 yo, and a roommate.) Contact Information: Vic Ripper - sister - [phone] Anticipated Caregiver: Shaune Spittle - son Anticipated Caregiver's Contact Information: Son Norberto Sorenson - [phone] Ability/Limitations of Caregiver: Son has moved back in with patient and he is not working, can assist. Caregiver Availability: 24/7 Discharge Plan Discussed with Primary Caregiver: Yes Is Caregiver In Agreement with Plan?: Yes Does Caregiver/Family have Issues with Lodging/Transportation while Pt is in Rehab?: No  Goals/Additional Needs Patient/Family Goal for  Rehab: PT/OT supervision  to min assist goals Expected length of stay: 12-16 days Cultural Considerations: None Dietary Needs: Regular diet, thin liquids Equipment Needs: TBD Pt/Family Agrees to Admission and willing to participate: Yes Program Orientation Provided & Reviewed with Pt/Caregiver Including Roles  & Responsibilities: Yes  Decrease burden of Care through IP rehab admission: N/A  Possible need for SNF placement upon discharge: Not planned  Patient Condition: This patient's medical and functional status has changed since the consult dated: 02/18/18 in which the Rehabilitation Physician determined and documented that the patient's condition is appropriate for intensive rehabilitative care in an inpatient rehabilitation facility. See "History of Present Illness" (above) for medical update. Functional changes are:  Currently requiring min assist to ambulate 10 feet RW. Patient's medical and functional status update has been discussed with the Rehabilitation physician and patient remains appropriate for inpatient rehabilitation. Will admit to inpatient rehab today.  Preadmission Screen Completed By:  Retta Diones, 02/21/2018 10:51 AM ______________________________________________________________________   Discussed status with Dr. Naaman Plummer on 02/21/18 at 1057 and received telephone approval for admission today.  Admission Coordinator:  Retta Diones, time 1057/Date 02/21/18             Cosigned by: Meredith Staggers, MD at 02/21/2018 11:04 AM  Revision History

## 2018-02-21 NOTE — Progress Notes (Addendum)
Rehab admissions - I now have approval for acute inpatient rehab admission.  We do have rehab bed available today.  Will check with attending MD for medical readiness.  Call me for questions.  #[phone]  I spoke with Dr. Ellene Route.  Will admit to inpatient rehab today.  #[phone]

## 2018-02-21 NOTE — PMR Pre-admission (Signed)
PMR Admission Coordinator Pre-Admission Assessment  Patient: Ashley [name] is an 42 y.o., female MRN: [id number] DOB: 05-30-1976 Height: _0  (180.3 cm) Weight: 118.8 kg (262 lb)              Insurance Information HMO:      PPO:  Yes     PCP:       IPA:       80/20:       OTHER:  Group # [id number] PRIMARY: BCBS      Policy#: [id number]      Subscriber: patient CM Name: Elspeth Cho      Phone#: [phone]     Fax#: [phone] Pre-Cert#: [id number] from 6/4 to 03/07/18 with update due on 03/06/18      Employer:   Benefits:  Phone #: [phone]     Name:  On line portal Eff. Date: 09/20/17     Deduct:  $250 (met $233.99)      Out of Pocket Max: $600 (met $600)      Life Max: N/A CIR: 70%/ 30%      SNF: 70% with 60 days max Outpatient: 30 visits max     Co-Pay: $20/visit Home Health: 70% with medical necessity      Co-Pay: 30% DME: 70%     Co-Pay: 30% Providers: in network  Emergency [place]    Name Relation Home Work [place] Sister [phone]     Fredrik Cove   [phone]   Doty,Adele Mother [phone]  [phone]     Current Medical History  Patient Admitting Diagnosis:  L3-sacrum fixation  History of Present Illness: a 42 year old right-handed female with past history of polycystic ovarian syndrome, hypertension, morbid obesity, bilateral TKA and right THA, lumbar spondylosis with chronic radicular back pain with herniated nucleus pulposus at L4-5 and back surgery 2017.  Per chart review patient lives with 63-year-old and 8 year old son.  Reported to be independent prior to admission but sedentary.  Son can provide assistance as needed.  One level home with one-step to entry.  Presented 02/17/2018 with progressive back pain radiating to the lower extremities.  X-rays and imaging as well as recent myelogram showed degenerative disc disease L3-4, 4-5 and L5-S1 with chronic lumbar radiculopathy.  Underwent  anterior exposure of L5-S1 per Dr. Donnetta Hutching as well as anterolateral decompression L3-4 and L4-5 as well as percutaneous fixation L3 to the sacrum 02/17/2018 per Dr. Ellene Route.  Hospital course pain management.  Acute blood loss anemia 9.4 and monitored.  Back brace when out of bed.  Physical and occupational therapy evaluations completed with recommendations of physical medicine rehab consult.  Patient to be admitted for a comprehensive inpatient rehab program.  Past Medical History  Past Medical History:  Diagnosis Date  . Arthritis    knees, back  . 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 Complication of anesthesia   . Depression   . History of pneumonia   . Hypertension   . Lumbar spondylosis   . PCOS (polycystic ovarian syndrome)   . Pneumonia    2016  . PONV (postoperative nausea and vomiting)     Family History  family history includes Cancer in her father, maternal grandmother, and paternal grandmother; Diabetes in her father and sister; Heart disease in her father; Kidney disease in her father.  Prior Rehab/Hospitalizations: No previous rehab.  Has the patient had major surgery during 100 days prior to admission? No  Current Medications   Current  Facility-Administered Medications:  .  0.9 %  sodium chloride infusion, 250 mL, Intravenous, Continuous, Kristeen Miss, MD, Stopped at 02/17/18 2330 .  acetaminophen (TYLENOL) tablet 650 mg, 650 mg, Oral, Q4H PRN **OR** acetaminophen (TYLENOL) suppository 650 mg, 650 mg, Rectal, Q4H PRN, Kristeen Miss, MD .  ALPRAZolam Duanne Moron) tablet 1 mg, 1 mg, Oral, Daily PRN, Kristeen Miss, MD .  alum & mag hydroxide-simeth (MAALOX/MYLANTA) 200-200-20 MG/5ML suspension 30 mL, 30 mL, Oral, Q6H PRN, Kristeen Miss, MD .  baclofen (LIORESAL) tablet 20 mg, 20 mg, Oral, QID, Kristeen Miss, MD, 20 mg at 02/21/18 0825 .  bisacodyl (DULCOLAX) suppository 10 mg, 10 mg, Rectal, Daily PRN, Kristeen Miss, MD .  buPROPion (WELLBUTRIN XL) 24 hr tablet 300  mg, 300 mg, Oral, Daily, Kristeen Miss, MD, 300 mg at 02/21/18 0825 .  docusate sodium (COLACE) capsule 100 mg, 100 mg, Oral, BID PRN, Kristeen Miss, MD .  docusate sodium (COLACE) capsule 100 mg, 100 mg, Oral, BID, Kristeen Miss, MD, 100 mg at 02/21/18 7035 .  furosemide (LASIX) tablet 20 mg, 20 mg, Oral, Daily PRN, Kristeen Miss, MD .  gabapentin (NEURONTIN) capsule 600 mg, 600 mg, Oral, QID, Kristeen Miss, MD, 600 mg at 02/21/18 0824 .  losartan (COZAAR) tablet 50 mg, 50 mg, Oral, Daily, 50 mg at 02/21/18 0824 **AND** hydrochlorothiazide (MICROZIDE) capsule 12.5 mg, 12.5 mg, Oral, Daily, Kristeen Miss, MD, 12.5 mg at 02/21/18 0825 .  HYDROcodone-acetaminophen (NORCO/VICODIN) 5-325 MG per tablet 2 tablet, 2 tablet, Oral, Q4H PRN, Kristeen Miss, MD, 2 tablet at 02/20/18 1755 .  lactated ringers infusion, , Intravenous, Continuous, Kristeen Miss, MD, Stopped at 02/17/18 2331 .  loratadine (CLARITIN) tablet 10 mg, 10 mg, Oral, Daily, Kristeen Miss, MD, 10 mg at 02/21/18 0825 .  menthol-cetylpyridinium (CEPACOL) lozenge 3 mg, 1 lozenge, Oral, PRN **OR** phenol (CHLORASEPTIC) mouth spray 1 spray, 1 spray, Mouth/Throat, PRN, Kristeen Miss, MD .  methocarbamol (ROBAXIN) tablet 500 mg, 500 mg, Oral, Q6H PRN, 500 mg at 02/19/18 1039 **OR** methocarbamol (ROBAXIN) 500 mg in dextrose 5 % 50 mL IVPB, 500 mg, Intravenous, Q6H PRN, Kristeen Miss, MD .  methocarbamol (ROBAXIN) tablet 500 mg, 500 mg, Oral, Q6H PRN, Kristeen Miss, MD .  morphine 4 MG/ML injection 4 mg, 4 mg, Intravenous, Q2H PRN, Kristeen Miss, MD, 4 mg at 02/21/18 0824 .  ondansetron (ZOFRAN) tablet 4 mg, 4 mg, Oral, Q6H PRN **OR** ondansetron (ZOFRAN) injection 4 mg, 4 mg, Intravenous, Q6H PRN, Kristeen Miss, MD .  oxyCODONE-acetaminophen (PERCOCET/ROXICET) 5-325 MG per tablet 1 tablet, 1 tablet, Oral, Q4H PRN, 1 tablet at 02/20/18 0451 **AND** oxyCODONE (Oxy IR/ROXICODONE) immediate release tablet 5 mg, 5 mg, Oral, Q4H PRN, Pierce, Dwayne A, RPH,  5 mg at 02/20/18 0451 .  polyethylene glycol (MIRALAX / GLYCOLAX) packet 17 g, 17 g, Oral, Daily PRN, Kristeen Miss, MD .  senna (SENOKOT) tablet 8.6 mg, 1 tablet, Oral, BID, Kristeen Miss, MD, 8.6 mg at 02/21/18 0825 .  sertraline (ZOLOFT) tablet 50 mg, 50 mg, Oral, Daily, Kristeen Miss, MD, 50 mg at 02/21/18 0826 .  sodium chloride flush (NS) 0.9 % injection 3 mL, 3 mL, Intravenous, Q12H, Kristeen Miss, MD, 3 mL at 02/21/18 0827 .  sodium chloride flush (NS) 0.9 % injection 3 mL, 3 mL, Intravenous, PRN, Kristeen Miss, MD .  sodium phosphate (FLEET) 7-19 GM/118ML enema 1 enema, 1 enema, Rectal, Once PRN, Kristeen Miss, MD  Patients Current Diet:  Diet Order  Diet - low sodium heart healthy        Diet regular Room service appropriate? Yes; Fluid consistency: Thin  Diet effective now          Precautions / Restrictions Precautions Precautions: Back, Fall Precaution Booklet Issued: No Precaution Comments: reviewed verbally with Pt and son Restrictions Weight Bearing Restrictions: No   Has the patient had 2 or more falls or a fall with injury in the past year?No  Prior Activity Level Community (5-7x/wk): Went out daily, was driving.  Home Assistive Devices / Equipment Home Equipment: Environmental consultant - 2 wheels, Cane - single point, Hand held shower head, Shower seat  Prior Device Use: Indicate devices/aids used by the patient prior to current illness, exacerbation or injury? None  Prior Functional Level Prior Function Level of Independence: Independent  Self Care: Did the patient need help bathing, dressing, using the toilet or eating?  Independent  Indoor Mobility: Did the patient need assistance with walking from room to room (with or without device)? Independent  Stairs: Did the patient need assistance with internal or external stairs (with or without device)? Independent  Functional Cognition: Did the patient need help planning regular tasks such as shopping or  remembering to take medications? Independent  Current Functional Level Cognition  Overall Cognitive Status: Within Functional Limits for tasks assessed Orientation Level: Oriented X4    Extremity Assessment (includes Sensation/Coordination)  Upper Extremity Assessment: Generalized weakness  Lower Extremity Assessment: RLE deficits/detail RLE Deficits / Details: assymetrical weakness, noted 1-2 /5 upon testing various motions. Active contraction noted. Pt reports muscle spasms present RLE Coordination: decreased fine motor, decreased gross motor    ADLs  Overall ADL's : Needs assistance/impaired Eating/Feeding: Set up, Sitting Grooming: Wash/dry face, Oral care, Standing, Minimal assistance Grooming Details (indicate cue type and reason): at sink Upper Body Bathing: Maximal assistance, Sitting Lower Body Bathing: Maximal assistance Upper Body Dressing : Maximal assistance Upper Body Dressing Details (indicate cue type and reason): to don hospital gown Lower Body Dressing: Minimal assistance, Adhering to back precautions, With adaptive equipment Lower Body Dressing Details (indicate cue type and reason): min A +2 sit<>stand; pt able to doff and don her socks with reacher and sock aid Toilet Transfer: Minimal assistance, +2 for safety/equipment, +2 for physical assistance, Ambulation, BSC, RW Toilet Transfer Details (indicate cue type and reason): simulated through recliner transfer Toileting- Clothing Manipulation and Hygiene: Moderate assistance Toileting - Clothing Manipulation Details (indicate cue type and reason): min A +2 sit<>stand; could do her clothing management but needed A for hygiene Functional mobility during ADLs: +2 for physical assistance, +2 for safety/equipment, Moderate assistance, Rolling walker(SPT only)    Mobility  Overal bed mobility: Needs Assistance Bed Mobility: Rolling, Sidelying to Sit Rolling: Mod assist, +2 for safety/equipment(vc for  sequencing) Sidelying to sit: Mod assist General bed mobility comments: received on BSC    Transfers  Overall transfer level: Needs assistance Equipment used: Rolling walker (2 wheeled) Transfers: Sit to/from Stand, Stand Pivot Transfers Sit to Stand: Min assist, +2 safety/equipment, From elevated surface(blocking out RLE; bed at very high elevation) Stand pivot transfers: (assist to progress RLE, vc for sequencing) General transfer comment: performed x2 during session with increased time and effort. VCs for hand placement and positioning during controlled descent to chair.  Cues for precuations    Ambulation / Gait / Stairs / Wheelchair Mobility  Ambulation/Gait Ambulation/Gait assistance: Min Wellsite geologist (Feet): 10 Feet Assistive device: Rolling walker (2 wheeled) Gait Pattern/deviations: Step-to pattern General Gait  Details: VCs for sequencing, able to perform turn with mobility today, heavy reliance on RW, increased assist with fatigue.  VCs for quad setting during loading response of RLE Gait velocity: decreased Gait velocity interpretation: <1.31 ft/sec, indicative of household ambulator    Posture / Balance Balance Overall balance assessment: Needs assistance Sitting-balance support: Bilateral upper extremity supported, Feet supported Sitting balance-Leahy Scale: Good Standing balance support: Bilateral upper extremity supported, During functional activity Standing balance-Leahy Scale: Poor Standing balance comment: able to release Rw breifly today for ADL performance x3. Some noted improvements in RLE strength in static standing.     Special needs/care consideration BiPAP/CPAP No CPM No Continuous Drip IV No Dialysis No      Life Vest No Oxygen No Special Bed No Trach Size No Wound Vac (area) No      Skin Back incision with dressing in place                             Bowel mgmt: Last BM 02/21/18 Bladder mgmt: Urinary catheter Diabetic mgmt No     Previous Home Environment Living Arrangements: Children Available Help at Discharge: Family, Available 24 hours/day Type of Home: House Home Layout: One level Home Access: Level entry Bathroom Shower/Tub: Chiropodist: Handicapped height Bathroom Accessibility: Yes Additional Comments: also has a 43 year old that relies on her  Discharge Living Setting Plans for Discharge Living Setting: House, Lives with (comment)(Lives with 57 yo son, 55 yo and her roommate.) Type of Home at Discharge: House Discharge Home Layout: One level Discharge Home Access: Stairs to enter Entrance Stairs-Number of Steps: 1/2 step to [place] Patient Roles: Parent, Other (Comment)(Has a 47 yo son, 40 yo, and a roommate.) Contact Information: Vic Ripper - sister - [phone] Anticipated Caregiver: Shaune Spittle - son Anticipated Caregiver's Contact Information: Son Norberto Sorenson - [phone] Ability/Limitations of Caregiver: Son has moved back in with patient and he is not working, can assist. Caregiver Availability: 24/7 Discharge Plan Discussed with Primary Caregiver: Yes Is Caregiver In Agreement with Plan?: Yes Does Caregiver/Family have Issues with Lodging/Transportation while Pt is in Rehab?: No  Goals/Additional Needs Patient/Family Goal for Rehab: PT/OT supervision to min assist goals Expected length of stay: 12-16 days Cultural Considerations: None Dietary Needs: Regular diet, thin liquids Equipment Needs: TBD Pt/Family Agrees to Admission and willing to participate: Yes Program Orientation Provided & Reviewed with Pt/Caregiver Including Roles  & Responsibilities: Yes  Decrease burden of Care through IP rehab admission: N/A  Possible need for SNF placement upon discharge: Not planned  Patient Condition: This patient's medical and functional status has changed since the consult dated: 02/18/18 in which the Rehabilitation Physician  determined and documented that the patient's condition is appropriate for intensive rehabilitative care in an inpatient rehabilitation facility. See "History of Present Illness" (above) for medical update. Functional changes are:  Currently requiring min assist to ambulate 10 feet RW. Patient's medical and functional status update has been discussed with the Rehabilitation physician and patient remains appropriate for inpatient rehabilitation. Will admit to inpatient rehab today.  Preadmission Screen Completed By:  Retta Diones, 02/21/2018 10:51 AM ______________________________________________________________________   Discussed status with Dr. Naaman Plummer on 02/21/18 at 1057 and received telephone approval for admission today.  Admission Coordinator:  Retta Diones, time 1057/Date 02/21/18

## 2018-02-21 NOTE — Progress Notes (Signed)
Patient arrived to unit by nursing staff. She was oriented to the Rehab unit with no questions at this time. She is rating her pain a 5/10 upon arrival.

## 2018-02-21 NOTE — Progress Notes (Signed)
CSW acknowledging consult for SNF placement as back up to CIR. Patient will admit to CIR today, no CSW assistance needed.  CSW signing off.  Laveda Abbe, [place] Clinical Social Worker [phone]

## 2018-02-21 NOTE — H&P (Deleted)
  The note originally documented on this encounter has been moved the the encounter in which it belongs.  

## 2018-02-21 NOTE — Progress Notes (Signed)
Physical Therapy Treatment Patient Details Name: Ashley [name] MRN: [id number] DOB: [birth year] Today's Date: 02/21/2018    History of Present Illness Pt is a 42 y/o female s/p Anterolateral decompression L3-4 and L4-5 with indirect decompression of L3-4 spacers spacer into L3-4 and L4-5. Percutaneous fixation L3 to the sacrum. 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    PT Comments    Patient is making progress toward PT goals. Current plan remains appropriate.     Follow Up Recommendations  CIR     Equipment Recommendations  (TBD)    Recommendations for Other Services Rehab consult     Precautions / Restrictions Precautions Precautions: Back;Fall Precaution Booklet Issued: No Precaution Comments: back precautions reviewed with pt  Restrictions Weight Bearing Restrictions: No    Mobility  Bed Mobility Overal bed mobility: Needs Assistance Bed Mobility: Rolling;Sidelying to Sit;Sit to Sidelying Rolling: Min guard Sidelying to sit: Min assist     Sit to sidelying: Mod assist General bed mobility comments: cues for sequencing; assist to bring bilat LE into bed and for maintaining back precautions   Transfers Overall transfer level: Needs assistance Equipment used: Rolling walker (2 wheeled) Transfers: Sit to/from Stand Sit to Stand: Min assist;From elevated surface         General transfer comment: cues for safe hand placement; assist to power up into standing and for safe return to sitting  Ambulation/Gait Ambulation/Gait assistance: Min assist Ambulation Distance (Feet): (42ft X2) Assistive device: Rolling walker (2 wheeled) Gait Pattern/deviations: Step-through pattern;Decreased step length - right(circumduction on R side) Gait velocity: decreased   General Gait Details:  cues for sequencing, posture, and weight shfiting to clear R foot    Stairs             Wheelchair Mobility    Modified Rankin (Stroke Patients Only)       Balance Overall balance assessment: Needs assistance Sitting-balance support: Bilateral upper extremity supported;Feet supported Sitting balance-Leahy Scale: Good     Standing balance support: Bilateral upper extremity supported;During functional activity Standing balance-Leahy Scale: Poor                              Cognition Arousal/Alertness: Awake/alert Behavior During Therapy: Flat affect Overall Cognitive Status: Within Functional Limits for tasks assessed                                        Exercises      General Comments        Pertinent Vitals/Pain Pain Assessment: Faces Faces Pain Scale: Hurts whole lot Pain Location: incisional sites Pain Descriptors / Indicators: Sore;Grimacing;Guarding;Sharp Pain Intervention(s): Limited activity within patient's tolerance;Monitored during session;Repositioned    Home Living                      Prior Function            PT Goals (current goals can now be found in the care plan section) Acute Rehab PT Goals Patient Stated Goal: get RLE working again - back to independent PT Goal Formulation: With patient/family Time For Goal Achievement: 03/04/18 Potential to Achieve Goals: Good Progress towards PT goals: Progressing toward goals    Frequency  Min 5X/week      PT Plan Current plan remains appropriate    Co-evaluation              AM-PAC PT "6 Clicks" Daily Activity  Outcome Measure  Difficulty turning over in bed (including adjusting bedclothes, sheets and blankets)?: Unable Difficulty moving from lying on back to sitting on the side of the bed? : Unable Difficulty sitting down on and standing up from a chair with arms (e.g., wheelchair, bedside commode, etc,.)?: Unable Help needed moving  to and from a bed to chair (including a wheelchair)?: A Little Help needed walking in hospital room?: A Little Help needed climbing 3-5 steps with a railing? : A Lot 6 Click Score: 11    End of Session Equipment Utilized During Treatment: Gait belt Activity Tolerance: Patient tolerated treatment well Patient left: in bed;with call bell/phone within reach;with family/visitor present Nurse Communication: Mobility status PT Visit Diagnosis: Unsteadiness on feet (R26.81);Muscle weakness (generalized) (M62.81);Pain;Difficulty in walking, not elsewhere classified (R26.2) Pain - Right/Left: Right Pain - part of body: Leg     Time: 1120-1145 PT Time Calculation (min) (ACUTE ONLY): 25 min  Charges:  $Gait Training: 8-22 mins $Therapeutic Activity: 8-22 mins                    G Codes:       Earney Navy, PTA Pager: [phone]     Darliss Cheney 02/21/2018, 3:57 PM

## 2018-02-22 ENCOUNTER — Inpatient Hospital Stay (HOSPITAL_COMMUNITY): Payer: BLUE CROSS/BLUE SHIELD

## 2018-02-22 ENCOUNTER — Inpatient Hospital Stay (HOSPITAL_COMMUNITY): Payer: BLUE CROSS/BLUE SHIELD | Admitting: Physical Therapy

## 2018-02-22 ENCOUNTER — Inpatient Hospital Stay (HOSPITAL_COMMUNITY): Payer: BLUE CROSS/BLUE SHIELD | Admitting: Occupational Therapy

## 2018-02-22 DIAGNOSIS — K5903 Drug induced constipation: Secondary | ICD-10-CM

## 2018-02-22 DIAGNOSIS — M159 Polyosteoarthritis, unspecified: Secondary | ICD-10-CM

## 2018-02-22 DIAGNOSIS — F329 Major depressive disorder, single episode, unspecified: Secondary | ICD-10-CM

## 2018-02-22 DIAGNOSIS — G8918 Other acute postprocedural pain: Secondary | ICD-10-CM

## 2018-02-22 DIAGNOSIS — R339 Retention of urine, unspecified: Secondary | ICD-10-CM

## 2018-02-22 DIAGNOSIS — M792 Neuralgia and neuritis, unspecified: Secondary | ICD-10-CM

## 2018-02-22 DIAGNOSIS — T402X5A Adverse effect of other opioids, initial encounter: Secondary | ICD-10-CM

## 2018-02-22 DIAGNOSIS — M7989 Other specified soft tissue disorders: Secondary | ICD-10-CM

## 2018-02-22 LAB — CBC WITH DIFFERENTIAL/PLATELET
Abs Immature Granulocytes: 0.1 10*3/uL (ref 0.0–0.1)
Basophils Absolute: 0.1 10*3/uL (ref 0.0–0.1)
Basophils Relative: 1 %
Eosinophils Absolute: 0.4 10*3/uL (ref 0.0–0.7)
Eosinophils Relative: 5 %
HCT: 28.4 % — ABNORMAL LOW (ref 36.0–46.0)
Hemoglobin: 8.6 g/dL — ABNORMAL LOW (ref 12.0–15.0)
Immature Granulocytes: 1 %
Lymphocytes Relative: 23 %
Lymphs Abs: 1.8 10*3/uL (ref 0.7–4.0)
MCH: 24.6 pg — ABNORMAL LOW (ref 26.0–34.0)
MCHC: 30.3 g/dL (ref 30.0–36.0)
MCV: 81.4 fL (ref 78.0–100.0)
Monocytes Absolute: 0.8 10*3/uL (ref 0.1–1.0)
Monocytes Relative: 10 %
Neutro Abs: 4.8 10*3/uL (ref 1.7–7.7)
Neutrophils Relative %: 60 %
Platelets: 251 10*3/uL (ref 150–400)
RBC: 3.49 MIL/uL — ABNORMAL LOW (ref 3.87–5.11)
RDW: 15.6 % — ABNORMAL HIGH (ref 11.5–15.5)
WBC: 8 10*3/uL (ref 4.0–10.5)

## 2018-02-22 LAB — COMPREHENSIVE METABOLIC PANEL
ALT: 24 U/L (ref 14–54)
AST: 27 U/L (ref 15–41)
Albumin: 2.8 g/dL — ABNORMAL LOW (ref 3.5–5.0)
Alkaline Phosphatase: 67 U/L (ref 38–126)
Anion gap: 9 (ref 5–15)
BUN: 9 mg/dL (ref 6–20)
CO2: 32 mmol/L (ref 22–32)
Calcium: 8.5 mg/dL — ABNORMAL LOW (ref 8.9–10.3)
Chloride: 99 mmol/L — ABNORMAL LOW (ref 101–111)
Creatinine, Ser: 0.75 mg/dL (ref 0.44–1.00)
GFR calc Af Amer: >60 mL/min (ref >60)
GFR calc non Af Amer: >60 mL/min (ref >60)
Glucose, Bld: 85 mg/dL (ref 65–99)
Potassium: 4.2 mmol/L (ref 3.5–5.1)
Sodium: 140 mmol/L (ref 135–145)
Total Bilirubin: 0.6 mg/dL (ref 0.3–1.2)
Total Protein: 6.5 g/dL (ref 6.5–8.1)

## 2018-02-22 MED ORDER — POLYETHYLENE GLYCOL 3350 17 G PO PACK
17.0000 g | PACK | Freq: Every day | ORAL | Status: DC
  Administered 2018-02-27: 17 g via ORAL
  Filled 2018-02-22 (×7): qty 1

## 2018-02-22 MED ORDER — OXYCODONE-ACETAMINOPHEN 5-325 MG PO TABS
1.0000 | ORAL_TABLET | Freq: Four times a day (QID) | ORAL | Status: DC | PRN
  Administered 2018-02-22 – 2018-02-23 (×3): 2 via ORAL
  Administered 2018-02-24: 1 via ORAL
  Administered 2018-02-26 – 2018-02-27 (×5): 2 via ORAL
  Administered 2018-02-28: 1 via ORAL
  Administered 2018-02-28 – 2018-03-07 (×10): 2 via ORAL
  Filled 2018-02-22 (×14): qty 2
  Filled 2018-02-22: qty 1
  Filled 2018-02-22 (×2): qty 2
  Filled 2018-02-22: qty 1
  Filled 2018-02-22 (×3): qty 2

## 2018-02-22 MED ORDER — OXYCODONE HCL ER 10 MG PO T12A
10.0000 mg | EXTENDED_RELEASE_TABLET | Freq: Two times a day (BID) | ORAL | Status: DC
  Administered 2018-02-22 – 2018-03-01 (×15): 10 mg via ORAL
  Filled 2018-02-22 (×16): qty 1

## 2018-02-22 MED FILL — Thrombin For Soln 5000 Unit: CUTANEOUS | Qty: 5000 | Status: AC

## 2018-02-22 NOTE — Progress Notes (Addendum)
Preliminary results by tech - Venous Lower Ext. Duplex Completed. Negative for acute deep vein thrombosis in both legs. 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 [name], BS, RDMS, RVT

## 2018-02-22 NOTE — Evaluation (Signed)
Physical Therapy Assessment and Plan  Patient Details  Name: BONNYE HALLE MRN: [id number] Date of Birth: 1976/01/21  PT Diagnosis: Abnormality of gait, Hypotonia, Low back pain and Muscle weakness Rehab Potential: Good ELOS: 10-14days    Today's Date: 02/23/2018 PT Individual Time: 1300-1400 AND 1600-1630  60 min and 30 min       Problem List:  Patient Active Problem List   Diagnosis Date Noted  . Neuropathic pain   . Therapeutic opioid induced constipation   . Urinary retention   . Lumbar radiculopathy 02/21/2018  . PCOS (polycystic ovarian syndrome)   . Morbid obesity ([place])   . Depression   . Generalized OA   . Postoperative pain   . Acute blood loss anemia   . Status post surgery 02/17/2018  . Lumbar radiculopathy, chronic 02/17/2018  . Primary localized osteoarthritis of right hip 04/21/2016  . Sinusitis 06/25/2015  . Cough 06/25/2015  . Essential hypertension 04/03/2015  . Dehydration 04/03/2015  . CAP (community acquired pneumonia) 03/31/2015  . Right lower lobe pneumonia ([place]) 03/31/2015  . Fever 03/31/2015  . Pregnancy 04/29/2014  . Miscarriage   . Incomplete miscarriage 04/02/2011    Class: Acute  . Deep vein thrombosis (DVT) (HCC) 04/02/2011    Class: Chronic    Past Medical History:  Past Medical History:  Diagnosis Date  . Arthritis    knees, back  . 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 Complication of anesthesia   . Depression   . History of pneumonia   . Hypertension   . Lumbar spondylosis   . PCOS (polycystic ovarian syndrome)   . Pneumonia    2016  . PONV (postoperative nausea and vomiting)    Past Surgical History:  Past Surgical History:  Procedure Laterality Date  . ABDOMINAL EXPOSURE N/A 02/17/2018   Procedure: ABDOMINAL EXPOSURE;  Surgeon: Rosetta Posner, MD;  Location: [facility] OR;  Service: Vascular;  Laterality: N/A;  . ANKLE SURGERY     x 2  . ANTERIOR LAT LUMBAR FUSION N/A 02/17/2018   Procedure: Lumbar three-four  Lumbar four-five  Anterolateral lumbar interbody fusion;  Surgeon: Kristeen Miss, MD;  Location: [place];  Service: Neurosurgery;  Laterality: N/A;  . ANTERIOR LUMBAR FUSION N/A 02/17/2018   Procedure: Lumbar five-Sacral one Anterior lumbar interbody fusion;  Surgeon: Kristeen Miss, MD;  Location: [place];  Service: Neurosurgery;  Laterality: N/A;  . APPLICATION OF ROBOTIC ASSISTANCE FOR SPINAL PROCEDURE N/A 02/17/2018   Procedure: APPLICATION OF ROBOTIC ASSISTANCE FOR SPINAL PROCEDURE;  Surgeon: Kristeen Miss, MD;  Location: [place];  Service: Neurosurgery;  Laterality: N/A;  . BACK SURGERY     2017  discectomy  . CARPAL TUNNEL RELEASE Left 07/04/2014   Procedure: LEFT CARPAL TUNNEL RELEASE;  Surgeon: Ninetta Lights, MD;  Location: [place];  Service: Orthopedics;  Laterality: Left;  . CARPAL TUNNEL RELEASE Right 08/08/2014   Procedure: RIGHT CARPAL TUNNEL RELEASE;  Surgeon: Ninetta Lights, MD;  Location: [place];  Service: Orthopedics;  Laterality: Right;  . DILATION AND CURETTAGE OF UTERUS    . DILATION AND EVACUATION  04/02/2011   Procedure: DILATATION AND EVACUATION (D&E);  Surgeon: Luz Lex, MD;  Location: [facility];  Service: Gynecology;  Laterality: N/A;  dvt left mid thigh  . DORSAL COMPARTMENT RELEASE Left 07/04/2014   Procedure: LEFT DEQUERVAINS;  Surgeon: Ninetta Lights, MD;  Location: [place];  Service: Orthopedics;  Laterality: Left;  . ENDOSCOPIC  PLANTAR FASCIOTOMY Left 02/01/2002  . HYSTEROSCOPY W/D&C  07/17/2010   with exc. endometrial polyps  . KNEE ARTHROSCOPY Right 03/07/2003; 01/14/2005; 08/31/2006  . KNEE ARTHROSCOPY Left 03/21/2003; 11/26/2004  . LUMBAR PERCUTANEOUS PEDICLE SCREW 3 LEVEL N/A 02/17/2018   Procedure: LUMBAR PERCUTANEOUS PEDICLE SCREW PLACEMENT LUMBAR THREE-SACRAL ONE;  Surgeon: Kristeen Miss, MD;  Location: [place];  Service: Neurosurgery;  Laterality: N/A;  . SHOULDER ARTHROSCOPY Right   . TOE SURGERY Left 01/10/2003    claw toe correction 2nd, 3rd, 4th toes  . TOTAL HIP ARTHROPLASTY Right 04/21/2016  . TOTAL HIP ARTHROPLASTY Right 04/21/2016   Procedure: TOTAL HIP ARTHROPLASTY ANTERIOR APPROACH;  Surgeon: Ninetta Lights, MD;  Location: [place];  Service: Orthopedics;  Laterality: Right;  . TOTAL KNEE ARTHROPLASTY Left 01/21/2010  . TOTAL KNEE ARTHROPLASTY Right 08/26/2008    Assessment & Plan Clinical Impression: Patient is a Kyra Laffey is a 42 year old right-handed female with past history of polycystic ovarian syndrome, hypertension, morbid obesity, bilateral TKA and right THA, lumbar spondylosis with chronic radicular back pain with herniated nucleus pulposus at L4-5 and back surgery 2017.Per chart review patient lives with 63-year-old and 8 year old son. Reported to be independent prior to admission but sedentary. Son can provide assistance as needed. One level home with one-step to entry. Presented 02/17/2018 with progressive back pain radiating to the lower extremities. X-rays and imaging as well as recent myelogram showed degenerative disc disease L3-4, 4-5 and L5-S1 with chronic lumbar radiculopathy. Underwent anterior exposure of L5-S1 per Dr. Donnetta Hutching as well as anterolateraldecompression L3-4 and L4-5 as well as percutaneous fixation L3 to the sacrum 02/17/2018 per Dr. Ellene Route.Hospital course pain management. Acute blood loss anemia 9.4 and monitored. Back brace when out of bed. Physical and occupational therapy evaluations completed with recommendations of physical medicine rehab consult.   Patient transferred to CIR on 02/21/2018 .   Patient currently requires min with mobility secondary to muscle weakness and muscle paralysis, decreased cardiorespiratoy endurance and decreased balance strategies.  Prior to hospitalization, patient was independent  with mobility and lived with Son in a House home.  Home access is  Level entry(1/2 step up to porch and 1/2 step into house).  Patient will benefit  from skilled PT intervention to maximize safe functional mobility, minimize fall risk and decrease caregiver burden for planned discharge home with 24 hour supervision.  Anticipate patient will benefit from follow up [place] at discharge.  PT - End of Session Endurance Deficit: Yes Endurance Deficit Description: pt required multiple rest breaks, attributes mostly to pain  Skilled Therapeutic Intervention Pt received sitting in WC and agreeable to PT. PT instructed patient in PT Evaluation and initiated treatment intervention; see below for results. PT educated patient in [place], rehab potential, rehab goals, and discharge recommendations. Pt returned to room and performed stand pivot transfer to bed with min assist transfer to bed with cues for safety. Sit>supine completed with ** and left supine in bed with call bell in reach and all needs met.   Session 2.  Pt received supine in bed and agreeable to PT at bed level and reports fall with RN prior to PT treatment. PT instructed pt in supine therex. AAROM SAQ, hip flexion/extesion, ankle PF/DF. LLE SLR all completed x 8-10 with assist from PT. Pt reports significant pain in L hip/upper thigh with anyy Active or passive movement. Pt left supine in bed and RN mad aware of new pain.    PT Evaluation Precautions/Restrictions Precautions Precautions: Back;Fall Precaution Comments: back  precautions reviewed with pt  Restrictions Weight Bearing Restrictions: No General   Vital Signs  Pain Pain Assessment Pain Scale: 0-10 Pain Score: 4  Pain Type: Surgical pain Pain Location: Back Pain Orientation: Lower Pain Descriptors / Indicators: Aching;Burning;Discomfort Pain Onset: On-going Pain Intervention(s): Repositioned(premedicated) Home Living/Prior Functioning Home Living Living Arrangements: Children Available Help at Discharge: Family;Available 24 hours/day Type of Home: House Home Access: Level entry(1/2 step up to porch and 1/2 step into  house) Home Layout: One level Bathroom Shower/Tub: Chiropodist: Handicapped height Bathroom Accessibility: Yes  Lives With: Son Prior Function Level of Independence: Independent with basic ADLs  Able to Take Stairs?: Yes Driving: Yes Vocation: On disability Vocation Requirements: on disability for 2 years Comments: has 60 yo daughter Vision/Perception  Geologist, engineering: Within Functional Limits Praxis Praxis: Intact  Cognition Overall Cognitive Status: Within Functional Limits for tasks assessed Arousal/Alertness: Awake/alert Attention: Alternating Memory: Appears intact Awareness: Appears intact Problem Solving: Appears intact Safety/Judgment: Appears intact Sensation Sensation Light Touch: Impaired by gross assessment(reports decreased sensation in RLE with "numbness") Coordination Fine Motor Movements are Fluid and Coordinated: Yes Finger Nose Finger Test: WNL Motor  Motor Motor: Abnormal tone;Paraplegia Motor - Skilled Clinical Observations: RLE weakness 2/2 back surgery   Mobility Bed Mobility Bed Mobility: Rolling Right;Rolling Left;Supine to Sit;Sit to Supine Rolling Right: Minimal Assistance - Patient > 75% Rolling Left: Minimal Assistance - Patient > 75% Supine to Sit: Minimal Assistance - Patient > 75% Sit to Supine: Minimal Assistance - Patient > 75% Transfers Transfers: Stand Pivot Transfers Sit to Stand: Minimal Assistance - Patient > 75% Stand to Sit: Minimal Assistance - Patient > 75% Stand Pivot Transfers: Minimal Assistance - Patient > 75% Stand Pivot Transfer Details: Verbal cues for sequencing;Verbal cues for technique;Verbal cues for precautions/safety;Verbal cues for gait pattern Transfer (Assistive device): Rolling walker Locomotion  Gait Ambulation: Yes Gait Assistance: Minimal Assistance - Patient > 75% Ambulation Distance (Feet): 25 Feet Assistive device: Rolling walker Gait Gait: Yes Gait Pattern: Step-to  pattern;Right steppage;Right hip hike;Right flexed knee in stance;Trunk flexed Stairs / Additional Locomotion Stairs: Yes Stairs Assistance: Minimal Assistance - Patient > 75% Stair Management Technique: Two rails Number of Stairs: 2 Height of Stairs: 3 Wheelchair Mobility Wheelchair Mobility: Yes Wheelchair Propulsion: Both upper extremities Wheelchair Parts Management: Supervision/cueing Distance: 132f  Trunk/Postural Assessment  Cervical Assessment Cervical Assessment: Within Functional Limits Thoracic Assessment Thoracic Assessment: Within Functional Limits Lumbar Assessment Lumbar Assessment: Exceptions to WFL(back precautions. ) Postural Control Postural Control: Within Functional Limits  Balance Balance Balance Assessed: Yes Static Sitting Balance Static Sitting - Balance Support: No upper extremity supported Static Sitting - Level of Assistance: 6: Modified independent (Device/Increase time) Dynamic Sitting Balance Dynamic Sitting - Balance Support: No upper extremity supported Dynamic Sitting - Level of Assistance: 4: Min assist Static Standing Balance Static Standing - Balance Support: Bilateral upper extremity supported Static Standing - Level of Assistance: 4: Min assist Dynamic Standing Balance Dynamic Standing - Balance Support: Bilateral upper extremity supported Dynamic Standing - Level of Assistance: 3: Mod assist;4: Min assist Extremity Assessment  RUE Assessment RUE Assessment: Within Functional Limits(ROM WFL, strength 5/5) LUE Assessment LUE Assessment: Within Functional Limits(ROM WFL, strength 5/5) RLE Assessment RLE Assessment: Exceptions to [facility] Strength Comments: knee extension 2/5. hip flexion 2/5. knee flexion 2+/5. ankle DF/PF 4/5. hip abduction 3-/5. hip adduction 2/5. hip extension 3+/5.  LLE Assessment LLE Assessment: Within Functional Limits   See Function Navigator for Current Functional Status.   Refer to  Care Plan for Long  Term Goals  Recommendations for other services: Neuropsych and Therapeutic Recreation  Kitchen group and Stress management  Discharge Criteria: Patient will be discharged from PT if patient refuses treatment 3 consecutive times without medical reason, if treatment goals not met, if there is a change in medical status, if patient makes no progress towards goals or if patient is discharged from hospital.  The above assessment, treatment plan, treatment alternatives and goals were discussed and mutually agreed upon: by patient  Lorie Phenix 02/22/2018, 1:35 PM

## 2018-02-22 NOTE — Evaluation (Signed)
Occupational Therapy Assessment and Plan  Patient Details  Name: Ashley [name] MRN: [id number] Date of Birth: August 19, 1976  OT Diagnosis: acute pain, lumbago (low back pain) and muscle weakness (generalized) Rehab Potential: Rehab Potential (ACUTE ONLY): Good ELOS: 9-12 days   Today's Date: 02/22/2018 OT Individual Time: 3662-9476 OT Individual Time Calculation (min): 58 min     Problem List:  Patient Active Problem List   Diagnosis Date Noted  . Neuropathic pain   . Therapeutic opioid induced constipation   . Urinary retention   . Lumbar radiculopathy 02/21/2018  . PCOS (polycystic ovarian syndrome)   . Morbid obesity ([place])   . Depression   . Generalized OA   . Postoperative pain   . Acute blood loss anemia   . Status post surgery 02/17/2018  . Lumbar radiculopathy, chronic 02/17/2018  . Primary localized osteoarthritis of right hip 04/21/2016  . Sinusitis 06/25/2015  . Cough 06/25/2015  . Essential hypertension 04/03/2015  . Dehydration 04/03/2015  . CAP (community acquired pneumonia) 03/31/2015  . Right lower lobe pneumonia ([place]) 03/31/2015  . Fever 03/31/2015  . Pregnancy 04/29/2014  . Miscarriage   . Incomplete miscarriage 04/02/2011    Class: Acute  . Deep vein thrombosis (DVT) (HCC) 04/02/2011    Class: Chronic    Past Medical History:  Past Medical History:  Diagnosis Date  . Arthritis    knees, back  . 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 Complication of anesthesia   . Depression   . History of pneumonia   . Hypertension   . Lumbar spondylosis   . PCOS (polycystic ovarian syndrome)   . Pneumonia    2016  . PONV (postoperative nausea and vomiting)    Past Surgical History:  Past Surgical History:  Procedure Laterality Date  . ABDOMINAL EXPOSURE N/A 02/17/2018   Procedure: ABDOMINAL EXPOSURE;  Surgeon: Rosetta Posner, MD;  Location[facility];  Service: Vascular;  Laterality: N/A;  . ANKLE SURGERY     x 2  . ANTERIOR LAT LUMBAR FUSION N/A  02/17/2018   Procedure: Lumbar three-four Lumbar four-five  Anterolateral lumbar interbody fusion;  Surgeon: Kristeen Miss, MD;  Location: [place];  Service: Neurosurgery;  Laterality: N/A;  . ANTERIOR LUMBAR FUSION N/A 02/17/2018   Procedure: Lumbar five-Sacral one Anterior lumbar interbody fusion;  Surgeon: Kristeen Miss, MD;  Location: [place];  Service: Neurosurgery;  Laterality: N/A;  . APPLICATION OF ROBOTIC ASSISTANCE FOR SPINAL PROCEDURE N/A 02/17/2018   Procedure: APPLICATION OF ROBOTIC ASSISTANCE FOR SPINAL PROCEDURE;  Surgeon: Kristeen Miss, MD;  Location: [place];  Service: Neurosurgery;  Laterality: N/A;  . BACK SURGERY     2017  discectomy  . CARPAL TUNNEL RELEASE Left 07/04/2014   Procedure: LEFT CARPAL TUNNEL RELEASE;  Surgeon: Ninetta Lights, MD;  Location: [place];  Service: Orthopedics;  Laterality: Left;  . CARPAL TUNNEL RELEASE Right 08/08/2014   Procedure: RIGHT CARPAL TUNNEL RELEASE;  Surgeon: Ninetta Lights, MD;  Location: [place];  Service: Orthopedics;  Laterality: Right;  . DILATION AND CURETTAGE OF UTERUS    . DILATION AND EVACUATION  04/02/2011   Procedure: DILATATION AND EVACUATION (D&E);  Surgeon: Luz Lex, MD;  Location: [facility];  Service: Gynecology;  Laterality: N/A;  dvt left mid thigh  . DORSAL COMPARTMENT RELEASE Left 07/04/2014   Procedure: LEFT DEQUERVAINS;  Surgeon: Ninetta Lights, MD;  Location: [place];  Service: Orthopedics;  Laterality: Left;  .  ENDOSCOPIC PLANTAR FASCIOTOMY Left 02/01/2002  . HYSTEROSCOPY W/D&C  07/17/2010   with exc. endometrial polyps  . KNEE ARTHROSCOPY Right 03/07/2003; 01/14/2005; 08/31/2006  . KNEE ARTHROSCOPY Left 03/21/2003; 11/26/2004  . LUMBAR PERCUTANEOUS PEDICLE SCREW 3 LEVEL N/A 02/17/2018   Procedure: LUMBAR PERCUTANEOUS PEDICLE SCREW PLACEMENT LUMBAR THREE-SACRAL ONE;  Surgeon: Kristeen Miss, MD;  Location: [place];  Service: Neurosurgery;  Laterality: N/A;  . SHOULDER  ARTHROSCOPY Right   . TOE SURGERY Left 01/10/2003   claw toe correction 2nd, 3rd, 4th toes  . TOTAL HIP ARTHROPLASTY Right 04/21/2016  . TOTAL HIP ARTHROPLASTY Right 04/21/2016   Procedure: TOTAL HIP ARTHROPLASTY ANTERIOR APPROACH;  Surgeon: Ninetta Lights, MD;  Location: [place];  Service: Orthopedics;  Laterality: Right;  . TOTAL KNEE ARTHROPLASTY Left 01/21/2010  . TOTAL KNEE ARTHROPLASTY Right 08/26/2008    Assessment & Plan Clinical Impression: Patient is a 42 y.o. right-handed female with past history of polycystic ovarian syndrome, hypertension, morbid obesity, bilateral TKA and right THA, lumbar spondylosis with chronic radicular back pain with herniated nucleus pulposus at L4-5 and back surgery 2017.Per chart review patient lives with 62-year-old and 52 year old son. Reported to be independent prior to admission but sedentary. Son can provide assistance as needed. One level home with one-step to entry. Presented 02/17/2018 with progressive back pain radiating to the lower extremities. X-rays and imaging as well as recent myelogram showed degenerative disc disease L3-4, 4-5 and L5-S1 with chronic lumbar radiculopathy. Underwent anterior exposure of L5-S1 per Dr. Donnetta Hutching as well as anterolateraldecompression L3-4 and L4-5 as well as percutaneous fixation L3 to the sacrum 02/17/2018 per Dr. Ellene Route.Hospital course pain management. Acute blood loss anemia 9.4 and monitored. Back brace when out of bed. Physical and occupational therapy evaluations completed with recommendations of physical medicine rehab consult. Patient was admitted for a comprehensive rehab program.    Patient transferred to CIR on 02/21/2018 .    Patient currently requires min assist transfers to max assist LB dressing with basic self-care skills secondary to muscle weakness and decreased sitting balance, decreased standing balance, decreased balance strategies and pain.  Prior to hospitalization, patient could complete ADLs  with independent .  Patient will benefit from skilled intervention to increase independence with basic self-care skills prior to discharge home with care partner.  Anticipate patient will require intermittent supervision and follow up home health.  OT - End of Session Activity Tolerance: Tolerates 30+ min activity with multiple rests Endurance Deficit: Yes Endurance Deficit Description: pt required multiple rest breaks, attributes mostly to pain OT Assessment Rehab Potential (ACUTE ONLY): Good OT Patient demonstrates impairments in the following area(s): Balance;Endurance;Motor;Pain;Safety OT Basic ADL's Functional Problem(s): Grooming;Bathing;Dressing;Toileting OT Advanced ADL's Functional Problem(s): Simple Meal Preparation OT Transfers Functional Problem(s): Toilet;Tub/Shower OT Additional Impairment(s): None OT Plan OT Intensity: Minimum of 1-2 x/day, 45 to 90 minutes OT Frequency: 5 out of 7 days OT Duration/Estimated Length of Stay: 9-12 days OT Treatment/Interventions: Balance/vestibular training;Community reintegration;Discharge planning;Disease mangement/prevention;DME/adaptive equipment instruction;Functional mobility training;Neuromuscular re-education;Pain management;Patient/family education;Psychosocial support;Self Care/advanced ADL retraining;Skin care/wound managment;Therapeutic Activities;Therapeutic Exercise;UE/LE Strength taining/ROM;Visual/perceptual remediation/compensation OT Basic Self-Care Anticipated Outcome(s): Supervision OT Toileting Anticipated Outcome(s): Supervision OT Bathroom Transfers Anticipated Outcome(s): Supervision OT Recommendation Patient destination: Home Follow Up Recommendations: 24 hour supervision/assistance;Home health OT Equipment Recommended: None recommended by OT   Skilled Therapeutic Intervention OT eval completed with discussion of rehab process, OT purpose, POC, ELOS, and goals.  ADL assessment completed with bathing and dressing  from EOB.  Educated on back precautions with pt demonstrating inability  to achieve figure 4 position for LB bathing/dressing, therefore will benefit from AE to complete.  Min cues for orientation of LSO.  Min assist sit > stand from EOB with RW and min assist stand pivot transfers during session.    OT Evaluation Precautions/Restrictions  Precautions Precautions: Back;Fall Precaution Comments: back precautions reviewed with pt  Restrictions Weight Bearing Restrictions: No Pain Pain Assessment Pain Scale: 0-10 Pain Score: 7  Pain Type: Acute pain;Surgical pain Pain Location: Back Pain Orientation: Lower Pain Descriptors / Indicators: Aching;Burning;Discomfort Pain Onset: On-going Pain Intervention(s): Repositioned(premedicated) Home Living/Prior Functioning Home Living Family/patient expects to be discharged to:: Private residence Living Arrangements: Spouse/significant other, Children Available Help at Discharge: Family, Available 24 hours/day Type of Home: House Home Access: Level entry Home Layout: One level Bathroom Shower/Tub: Chiropodist: Handicapped height Bathroom Accessibility: Yes  Lives With: Son Prior Function Level of Independence: Independent with basic ADLs Driving: Yes Comments: has 50 yo daughter ADL  See Function Navigator Vision Baseline Vision/History: Wears glasses Wears Glasses: At all times Patient Visual Report: No change from baseline Vision Assessment?: No apparent visual deficits Perception  Perception: Within Functional Limits Praxis Praxis: Intact Cognition Overall Cognitive Status: Within Functional Limits for tasks assessed Arousal/Alertness: Awake/alert Orientation Level: Person;Place;Situation Person: Oriented Place: Oriented Situation: Oriented Year: 2019 Month: June Day of Week: Correct Memory: Appears intact Immediate Memory Recall: Sock;Blue;Bed Memory Recall: Sock;Blue;Bed Memory Recall Sock: Without  Cue Memory Recall Blue: Without Cue Memory Recall Bed: Without Cue Attention: Alternating Awareness: Appears intact Problem Solving: Appears intact Safety/Judgment: Appears intact Sensation Sensation Light Touch: Impaired by gross assessment(reports decreased sensation in RLE with "numbness") Coordination Fine Motor Movements are Fluid and Coordinated: Yes Finger Nose Finger Test: WNL Extremity/Trunk Assessment RUE Assessment RUE Assessment: Within Functional Limits(ROM WFL, strength 5/5) LUE Assessment LUE Assessment: Within Functional Limits(ROM WFL, strength 5/5)   See Function Navigator for Current Functional Status.   Refer to Care Plan for Long Term Goals  Recommendations for other services: None    Discharge Criteria: Patient will be discharged from OT if patient refuses treatment 3 consecutive times without medical reason, if treatment goals not met, if there is a change in medical status, if patient makes no progress towards goals or if patient is discharged from hospital.  The above assessment, treatment plan, treatment alternatives and goals were discussed and mutually agreed upon: by patient and by family  Vashon Arch, [facility] 02/22/2018, 10:46 AM

## 2018-02-22 NOTE — Progress Notes (Addendum)
[facility] PHYSICAL MEDICINE & REHABILITATION     PROGRESS NOTE  Subjective/Complaints:  Patient seen lying in bed this morning. She states she did not sleep well overnight because she is in pain. Significant increase in her pain medications, , despite being called by nursing yesterday and increasing pain medication at that time.  ROS: denies CP, SOB, nausea, vomiting, diarrhea.  Objective: Vital Signs: Blood pressure 117/79, pulse 91, temperature 98.8 F (37.1 C), temperature source Oral, resp. rate 18, height 5\' 11"  (1.803 m), weight 118.8 kg (262 lb), last menstrual period 01/28/2018, SpO2 99 %. No results found. No results for input(s): WBC, HGB, HCT, PLT in the last 72 hours. No results for input(s): NA, K, CL, GLUCOSE, BUN, CREATININE, CALCIUM in the last 72 hours.  Invalid input(s): CO CBG (last 3)  No results for input(s): GLUCAP in the last 72 hours.  Wt Readings from Last 3 Encounters:  02/21/18 118.8 kg (262 lb)  02/21/18 118.8 kg (262 lb)  02/08/18 119.2 kg (262 lb 12.8 oz)    Physical Exam:  BP 117/79 (BP Location: Right Arm)   Pulse 91   Temp 98.8 F (37.1 C) (Oral)   Resp 18   Ht 5\' 11"  (1.803 m)   Wt 118.8 kg (262 lb)   LMP 01/28/2018   SpO2 99%   BMI 36.54 kg/m  Constitutional: She appearswell-developed.Obese HENT: Normocephalicand atraumatic.  Cardiovascular:Normal rateand regular rhythm. No JVD. Respiratory:Effort normal. Clear. GI: Bowel sounds are normal. She exhibitsno distension.  Musculoskeletal:No edema or tenderness in extremities Neurological: She isalert.  Motor: B/l UE, LLE 5/5 proximal to distal.  RLE: HF, KE 1+/5, ADF 5/5  Decreased sensation right thigh.  Skin:warm and dry. Incision C/D/I Psychiatric: Flat affect..  Assessment/Plan: 1. Functional deficits secondary to lumbar spondylosis with radiculopathy.S/Pdecompression which require 3+ hours per day of interdisciplinary therapy in a comprehensive inpatient rehab  setting. Physiatrist is providing close team supervision and 24 hour management of active medical problems listed below. Physiatrist and rehab team continue to assess barriers to discharge/monitor patient progress toward functional and medical goals.  Function:  Bathing Bathing position      Bathing parts      Bathing assist        Upper Body Dressing/Undressing Upper body dressing                    Upper body assist        Lower Body Dressing/Undressing Lower body dressing                                  Lower body assist        Toileting Toileting          Toileting assist     Transfers Chair/bed transfer             Locomotion Ambulation           Wheelchair          Cognition Comprehension Comprehension assist level: Follows basic conversation/direction with no assist  Expression Expression assist level: Expresses basic needs/ideas: With no assist  Social Interaction    Problem Solving    Memory      Medical Problem List and Plan: 1.Decreased functional mobilitysecondary to lumbar spondylosis with radiculopathy.S/Pdecompression L3-4 and L4-5 with anterior exposure of L5-S1 as well as percutaneous fixation L3 to the sacrum 02/17/2018. Appears to have significant right L3 radiculopathy.Back brace  when out of bed  Begin CIR 2. DVT Prophylaxis/Anticoagulation: SCDs.   Vascular study pending 3. Pain Management:Neurontin 600 mg 4 times daily, baclofen 20 mg 4 times daily, Robaxin as needed  OxyContin 10 BID started on 6/4  Percocet changes to q6 PRN 4. Mood:Wellbutrin XL 300 mg daily, Zoloft 50 mg daily, Xanax 1 mg daily as needed 5. Neuropsych: This patientiscapable of making decisions on herown behalf. 6. Skin/Wound Care:Routine skin checks 7. Fluids/Electrolytes/Nutrition:Routine in and outs  Labs pending for this morning 8.Acute blood loss anemia.   Hemoglobin 8.6 on 6/5  Continue to  monitor 9.Hypertension. Cozaar 50 mg daily, HCTZ 12.5 mg daily.   Monitor with increased mobility 10.Morbid obesity. BMI 36.54.Follow-up dietary 11.Osteoarthritis. 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 Laxative assistance, increased on 6/5 13. Urinary retention  Will DC Foley and perform voidinrial  UA/U culture ordered  LOS (Days) 1 A FACE TO FACE EVALUATION WAS PERFORMED  Ankit Lorie Phenix 02/22/2018 8:03 AM

## 2018-02-22 NOTE — Progress Notes (Signed)
Social Work  Social Work Assessment and Plan  Patient Details  Name: Ashley [name] MRN: [id number] Date of Birth: 08-30-1976  Today's Date: 02/22/2018  Problem List:  Patient Active Problem List   Diagnosis Date Noted  . Neuropathic pain   . Therapeutic opioid induced constipation   . Urinary retention   . Lumbar radiculopathy 02/21/2018  . PCOS (polycystic ovarian syndrome)   . Morbid obesity ([place])   . Depression   . Generalized OA   . Postoperative pain   . Acute blood loss anemia   . Status post surgery 02/17/2018  . Lumbar radiculopathy, chronic 02/17/2018  . Primary localized osteoarthritis of right hip 04/21/2016  . Sinusitis 06/25/2015  . Cough 06/25/2015  . Essential hypertension 04/03/2015  . Dehydration 04/03/2015  . CAP (community acquired pneumonia) 03/31/2015  . Right lower lobe pneumonia ([place]) 03/31/2015  . Fever 03/31/2015  . Pregnancy 04/29/2014  . Miscarriage   . Incomplete miscarriage 04/02/2011    Class: Acute  . Deep vein thrombosis (DVT) ([facility]) 04/02/2011    Class: Chronic   Past Medical History:  Past Medical History:  Diagnosis Date  . Arthritis    knees, back  . 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 Complication of anesthesia   . Depression   . History of pneumonia   . Hypertension   . Lumbar spondylosis   . PCOS (polycystic ovarian syndrome)   . Pneumonia    2016  . PONV (postoperative nausea and vomiting)    Past Surgical History:  Past Surgical History:  Procedure Laterality Date  . ABDOMINAL EXPOSURE N/A 02/17/2018   Procedure: ABDOMINAL EXPOSURE;  Surgeon: Rosetta Posner, MD;  Location: [facility] OR;  Service: Vascular;  Laterality: N/A;  . ANKLE SURGERY     x 2  . ANTERIOR LAT LUMBAR FUSION N/A 02/17/2018   Procedure: Lumbar three-four Lumbar four-five  Anterolateral lumbar interbody fusion;  Surgeon: Kristeen Miss, MD;  Location: [facility];  Service: Neurosurgery;  Laterality: N/A;  . ANTERIOR LUMBAR FUSION N/A 02/17/2018   Procedure: Lumbar five-Sacral one Anterior lumbar interbody fusion;  Surgeon: Kristeen Miss, MD;  Location: [facility];  Service: Neurosurgery;  Laterality: N/A;  . APPLICATION OF ROBOTIC ASSISTANCE FOR SPINAL PROCEDURE N/A 02/17/2018   Procedure: APPLICATION OF ROBOTIC ASSISTANCE FOR SPINAL PROCEDURE;  Surgeon: Kristeen Miss, MD;  Location: [facility];  Service: Neurosurgery;  Laterality: N/A;  . BACK SURGERY     2017  discectomy  . CARPAL TUNNEL RELEASE Left 07/04/2014   Procedure: LEFT CARPAL TUNNEL RELEASE;  Surgeon: Ninetta Lights, MD;  Location: [facility];  Service: Orthopedics;  Laterality: Left;  . CARPAL TUNNEL RELEASE Right 08/08/2014   Procedure: RIGHT CARPAL TUNNEL RELEASE;  Surgeon: Ninetta Lights, MD;  Location: [facility];  Service: Orthopedics;  Laterality: Right;  . DILATION AND CURETTAGE OF UTERUS    . DILATION AND EVACUATION  04/02/2011   Procedure: DILATATION AND EVACUATION (D&E);  Surgeon: Luz Lex, MD;  Location: [facility];  Service: Gynecology;  Laterality: N/A;  dvt left mid thigh  . DORSAL COMPARTMENT RELEASE Left 07/04/2014   Procedure: LEFT DEQUERVAINS;  Surgeon: Ninetta Lights, MD;  Location: [facility];  Service: Orthopedics;  Laterality: Left;  . ENDOSCOPIC PLANTAR FASCIOTOMY Left 02/01/2002  . HYSTEROSCOPY W/D&C  07/17/2010   with exc. endometrial polyps  . KNEE ARTHROSCOPY Right 03/07/2003; 01/14/2005; 08/31/2006  . KNEE ARTHROSCOPY Left 03/21/2003; 11/26/2004  . LUMBAR PERCUTANEOUS PEDICLE  SCREW 3 LEVEL N/A 02/17/2018   Procedure: LUMBAR PERCUTANEOUS PEDICLE SCREW PLACEMENT LUMBAR THREE-SACRAL ONE;  Surgeon: Kristeen Miss, MD;  Location: [place];  Service: Neurosurgery;  Laterality: N/A;  . SHOULDER ARTHROSCOPY Right   . TOE SURGERY Left 01/10/2003   claw toe correction 2nd, 3rd, 4th toes  . TOTAL HIP ARTHROPLASTY Right 04/21/2016  . TOTAL HIP ARTHROPLASTY Right 04/21/2016   Procedure: TOTAL HIP ARTHROPLASTY ANTERIOR APPROACH;   Surgeon: Ninetta Lights, MD;  Location: [facility];  Service: Orthopedics;  Laterality: Right;  . TOTAL KNEE ARTHROPLASTY Left 01/21/2010  . TOTAL KNEE ARTHROPLASTY Right 08/26/2008   Social History:  reports that she has never smoked. She has never used smokeless tobacco. She reports that she does not drink alcohol or use drugs.  Family / Support Systems Marital Status: Single Patient Roles: Parent Children: son, Shaune Spittle (living with pt) @ (C) [phone];  43 yo daughter, Duncan Dull. Other Supports: mother, Hillery Jacks @ (C) [phone];  sister, Vic Ripper @ (C) [phone] and two "very close" girlfriends who help her regularly Anticipated Caregiver: Shaune Spittle - son Ability/Limitations of Caregiver: Son has moved back in with patient and he is not working, can assist. Careers adviser: 24/7 Family Dynamics: Pt describes family as very supportive and willing to assist, however, she "hates to have to ask for help."  Social History Preferred language: English Religion: Baptist Cultural Background: MA Education: HS Read: Yes Write: Yes Employment Status: Unemployed Date Retired/Disabled/Unemployed: ~ 2 yrs;  reports she was recently approved for SSD, however, has not begun receiving payments. Legal Hisotry/Current Legal Issues: None Guardian/Conservator: None - per MD, pt is capable of making decisions on her own behalf.   Abuse/Neglect Abuse/Neglect Assessment Can Be Completed: Yes Physical Abuse: Denies Verbal Abuse: Denies Sexual Abuse: Denies Exploitation of patient/patient's resources: Denies Self-Neglect: Denies  Emotional Status Pt's affect, behavior adn adjustment status: Pt presents with rather flat affect and difficult to engage in much conversation.  Will monitor as rapport builds and refer for neuropsychology if needed.  Pt does share that she is very frustrated with her chronic health issues (multiple past ortho surgeries) and her limitations  which require help from others.  Denies any significant emotional distress but will monitor. Recent Psychosocial Issues: hip surgery in 2017;  has  43 yo daughter and is a single parent. Pyschiatric History: None Substance Abuse History: None  Patient / Family Perceptions, Expectations & Goals Pt/Family understanding of illness & functional limitations: Pt and family with good understanding of the surgery performed, her precautions to follow and limitations/ need for CIR. Premorbid pt/family roles/activities: Pt had adapted to her back issues and how she provided physical assistance to her daughter. Anticipated changes in roles/activities/participation: dependent on goals - son is prepared to provide 24/7 caregiver support if needed. Pt/family expectations/goals: "I don't want to have to keep asking other people for help."  [employer]: None Premorbid Home Care/DME Agencies: Other (Comment)(has had HH in past but uncertain about agency) Transportation available at discharge: yes Resource referrals recommended: Neuropsychology  Discharge Planning Living Arrangements: Children Support Systems: Children, Parent, Other relatives, Friends/neighbors Type of Residence: Private residence Insurance Resources: Multimedia programmer (specify)(BCBS) Financial Screen Referred: No Living Expenses: Rent Money Management: Patient Does the patient have any problems obtaining your medications?: No Home Management: pt Patient/Family Preliminary Plans: Pt to d/c to her apartment with son staying and providing 24/7 assistance. Social Work Anticipated Follow Up Needs: HH/OP Expected length of stay: 12-16 days  Clinical  Impression Pleasant, soft spoken woman here on CIR following lumbar surgery.  H/O of multiple ortho surgeries and has had HH in the past.  She is admittedly frustrated with her limitations and hopeful she can "get my independence back".  Has good family support and  adult son able to provide 24/7 support.  SW to follow for support and d/c planning needs.  Milderd Manocchio 02/22/2018, 11:10 AM

## 2018-02-22 NOTE — Progress Notes (Signed)
Patient ambulating to bathroom. The right leg buckled and she hit her knee on the floor. With help of OT we got her in the wheelchair. Linna Hoff, PA was notified and was told to ice the the area that was hurt and to give pain medication.

## 2018-02-23 ENCOUNTER — Inpatient Hospital Stay (HOSPITAL_COMMUNITY): Payer: BLUE CROSS/BLUE SHIELD | Admitting: Physical Therapy

## 2018-02-23 ENCOUNTER — Inpatient Hospital Stay (HOSPITAL_COMMUNITY): Payer: BLUE CROSS/BLUE SHIELD | Admitting: Occupational Therapy

## 2018-02-23 DIAGNOSIS — I82532 Chronic embolism and thrombosis of left popliteal vein: Secondary | ICD-10-CM

## 2018-02-23 DIAGNOSIS — W19XXXA Unspecified fall, initial encounter: Secondary | ICD-10-CM

## 2018-02-23 DIAGNOSIS — R0989 Other specified symptoms and signs involving the circulatory and respiratory systems: Secondary | ICD-10-CM

## 2018-02-23 MED ORDER — ENOXAPARIN SODIUM 60 MG/0.6ML ~~LOC~~ SOLN
60.0000 mg | SUBCUTANEOUS | Status: DC
  Administered 2018-02-23 – 2018-03-06 (×12): 60 mg via SUBCUTANEOUS
  Filled 2018-02-23 (×14): qty 0.6

## 2018-02-23 NOTE — Progress Notes (Signed)
Occupational Therapy Session Note  Patient Details  Name: Ashley [name] MRN: [id number] Date of Birth: 23-Mar-1976  Today's Date: 02/23/2018 OT Individual Time: 8756-4332 OT Individual Time Calculation (min): 60 min    Short Term Goals: Week 1:  OT Short Term Goal 1 (Week 1): Pt will complete LB bathing with min assist with AE as needed OT Short Term Goal 2 (Week 1): Pt will complete LB dressing with min assist with AE as needed OT Short Term Goal 3 (Week 1): Pt will complete toilet transfers with min assist with LRAD  Skilled Therapeutic Interventions/Progress Updates:    Treatment session with focus on ADL retraining with bathing and dressing with use of AE.  Pt received supine in bed reporting "sore" from yesterday and from fall when ambulating to bathroom with nursing staff.  Pt wanting to complete bathing and dressing from EOB.  Pt required increased time due to aching and soreness with movement during self-care tasks. Pt washed perineal area and lower legs in modified circle sitting/with leg propped on EOB, also utilized long handled sponge to wash feet.  Pt attempted donning underwear with lateral leans on EOB due to fearfulness with standing.  Mod cues and encouragement to attempt donning shorts at sit > stand level with pt able to complete with assist to pull shorts over hips and steadying during transitional movement from sit > stand with RW.  Surgeon arrived towards end of session and pt tearful due to pain and frustration with slow pace of recovery.  Pt reports fatigue and requested to return to bed, completed bed mobility with assist to bring RLE into bed.  Therapy Documentation Precautions:  Precautions Precautions: Back, Fall Precaution Comments: back precautions reviewed with pt  Restrictions Weight Bearing Restrictions: No Pain: Pain Assessment Pain Scale: 0-10 Pain Score: 4  Pain Type: Surgical pain Pain Location: Back Pain Orientation: Lower Pain Descriptors /  Indicators: Aching;Nagging Pain Intervention(s): Medication (See eMAR)  See Function Navigator for Current Functional Status.   Therapy/Group: Individual Therapy  [name] 02/23/2018, 9:48 AM

## 2018-02-23 NOTE — Progress Notes (Signed)
Social Work Patient ID: Ashley [name], female   DOB: [birth year], 42 y.o.   MRN: [id number]   Have reviewed team conference with pt who is aware and agreeable with targeted d/c date of 6/14 and supervision goals.  Notes she is "sore...but I'm getting through it..."  Good sleep last night.  No concerns.   Collen Hostler, LCSW

## 2018-02-23 NOTE — Progress Notes (Signed)
[facility] PHYSICAL MEDICINE & REHABILITATION     PROGRESS NOTE  Subjective/Complaints:  Patient seen lying in bed this morning. She states she slept fairly overnight due to pain. She states yesterday was a painful day therapies. However, per discussion with therapies, patient able to participate. Also informed by nursing regarding assisted fall yesterday.  ROS: Denies CP, SOB, nausea, vomiting, diarrhea.  Objective: Vital Signs: Blood pressure (!) 148/88, pulse 85, temperature 97.9 F (36.6 C), temperature source Oral, resp. rate 18, height 5\' 11"  (1.803 m), weight 118.8 kg (262 lb), last menstrual period 01/28/2018, SpO2 100 %. No results found. Recent Labs    02/22/18 0724  WBC 8.0  HGB 8.6*  HCT 28.4*  PLT 251   Recent Labs    02/22/18 0724  NA 140  K 4.2  CL 99*  GLUCOSE 85  BUN 9  CREATININE 0.75  CALCIUM 8.5*   CBG (last 3)  No results for input(s): GLUCAP in the last 72 hours.  Wt Readings from Last 3 Encounters:  02/21/18 118.8 kg (262 lb)  02/21/18 118.8 kg (262 lb)  02/08/18 119.2 kg (262 lb 12.8 oz)    Physical Exam:  BP (!) 148/88 (BP Location: Right Arm)   Pulse 85   Temp 97.9 F (36.6 C) (Oral)   Resp 18   Ht 5\' 11"  (1.803 m)   Wt 118.8 kg (262 lb)   LMP 01/28/2018   SpO2 100%   BMI 36.54 kg/m  Constitutional: She appearswell-developed.Obese HENT: Normocephalicand atraumatic.  Cardiovascular:RRR. No JVD. Respiratory:Effort normal. Clear. GI: Bowel sounds are normal. She exhibitsno distension.  Musculoskeletal:No edema or tenderness in extremities Neurological: She isalert.  Motor: B/l UE, LLE 5/5 proximal to distal.  RLE: HF, KE 1+/5, ADF 5/5 (stable) Decreased sensation right thigh.  Skin:warm and dry. Incisions C/D/I Psychiatric: Flat affect..  Assessment/Plan: 1. Functional deficits secondary to lumbar spondylosis with radiculopathy.S/Pdecompression which require 3+ hours per day of interdisciplinary therapy in a  comprehensive inpatient rehab setting. Physiatrist is providing close team supervision and 24 hour management of active medical problems listed below. Physiatrist and rehab team continue to assess barriers to discharge/monitor patient progress toward functional and medical goals.  Function:  Bathing Bathing position   Position: Sitting EOB  Bathing parts Body parts bathed by patient: Right arm, Left arm, Chest, Abdomen, Front perineal area, Right upper leg, Left upper leg Body parts bathed by helper: Right lower leg, Left lower leg, Back, Buttocks  Bathing assist Assist Level: (Mod assist)      Upper Body Dressing/Undressing Upper body dressing   What is the patient wearing?: Pull over shirt/dress     Pull over shirt/dress - Perfomed by patient: Thread/unthread right sleeve, Thread/unthread left sleeve, Put head through opening, Pull shirt over trunk          Upper body assist Assist Level: Touching or steadying assistance(Pt > 75%)      Lower Body Dressing/Undressing Lower body dressing   What is the patient wearing?: Underwear, Non-skid slipper socks Underwear - Performed by patient: Pull underwear up/down Underwear - Performed by helper: Thread/unthread right underwear leg, Thread/unthread left underwear leg       Non-skid slipper socks- Performed by helper: Don/doff right sock, Don/doff left sock                  Lower body assist Assist for lower body dressing: (Total assist)      Media planner  assist     Transfers Chair/bed transfer   Chair/bed transfer method: Stand pivot Chair/bed transfer assist level: Touching or steadying assistance (Pt > 75%) Chair/bed transfer assistive device: Armrests, Medical sales representative     Max distance: 55ft Assist level: Touching or steadying assistance (Pt > 75%)   Wheelchair   Type: Manual Max wheelchair distance: 125 Assist Level: Supervision or verbal cues   Cognition Comprehension Comprehension assist level: Follows basic conversation/direction with no assist  Expression Expression assist level: Expresses basic needs/ideas: With no assist  Social Interaction Social Interaction assist level: Interacts appropriately with others - No medications needed.  Problem Solving Problem solving assist level: Solves basic 90% of the time/requires cueing < 10% of the time  Memory Memory assist level: Complete Independence: No helper    Medical Problem List and Plan: 1.Decreased functional mobilitysecondary to lumbar spondylosis with radiculopathy.S/Pdecompression L3-4 and L4-5 with anterior exposure of L5-S1 as well as percutaneous fixation L3 to the sacrum 02/17/2018. Appears to have significant right L3 radiculopathy.Back brace when out of bed  Continue CIR 2. DVT Prophylaxis/Anticoagulation: SCDs d/ced  Lovenox started on 6/6   Vascular study showing chronic, but not acute DVT 3. Pain Management:Neurontin 600 mg 4 times daily, baclofen 20 mg 4 times daily, Robaxin as needed  OxyContin 10 BID started on 6/4  Percocet changes to q6 PRN 4. Mood:Wellbutrin XL 300 mg daily, Zoloft 50 mg daily, Xanax 1 mg daily as needed 5. Neuropsych: This patientiscapable of making decisions on herown behalf. 6. Skin/Wound Care:Routine skin checks 7. Fluids/Electrolytes/Nutrition:Routine in and outs  BMP within acceptable range on 6/5 8.Acute blood loss anemia.   Hemoglobin 8.6 on 6/5  Continue to monitor 9.Hypertension. Cozaar 50 mg daily, HCTZ 12.5 mg daily.   Labile on 6/6 10.Morbid obesity. BMI 36.54.Follow-up dietary 11.Osteoarthritis. 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 Laxative assistance, increased on 6/5  improving 13. Urinary retention  Will DC Foley and perform voidinrial  UA/U culture pending  Improving 14. Assistive fall on right knee  No overt signs of trauma  Continue to  monitor  LOS (Days) 2 A FACE TO FACE EVALUATION WAS PERFORMED  Ashley [name] Lorie Phenix 02/23/2018 8:10 AM

## 2018-02-23 NOTE — Patient Care Conference (Signed)
Inpatient RehabilitationTeam Conference and Plan of Care Update Date: 02/22/2018   Time: 2:50 PM    Patient Name: Ashley [name]      Medical Record Number: [id number]  Date of Birth: 23-Feb-1976 Sex: Female         Room/Bed: 4M02C/4M02C-01 Payor Info: Payor: BLUE CROSS BLUE SHIELD / Plan: BCBS OTHER / Product Type: *No Product type* /    Admitting Diagnosis: L3 [place]um Fixation  Admit Date/Time:  02/21/2018  2:40 PM Admission Comments: No comment available   Primary Diagnosis:  <principal problem not specified> Principal Problem: <principal problem not specified>  Patient Active Problem List   Diagnosis Date Noted  . Fall   . Labile blood pressure   . Neuropathic pain   . Therapeutic opioid induced constipation   . Urinary retention   . Lumbar radiculopathy 02/21/2018  . PCOS (polycystic ovarian syndrome)   . Morbid obesity ([place])   . Depression   . Generalized OA   . Postoperative pain   . Acute blood loss anemia   . Status post surgery 02/17/2018  . Lumbar radiculopathy, chronic 02/17/2018  . Primary localized osteoarthritis of right hip 04/21/2016  . Sinusitis 06/25/2015  . Cough 06/25/2015  . Essential hypertension 04/03/2015  . Dehydration 04/03/2015  . CAP (community acquired pneumonia) 03/31/2015  . Right lower lobe pneumonia ([place]) 03/31/2015  . Fever 03/31/2015  . Pregnancy 04/29/2014  . Miscarriage   . Incomplete miscarriage 04/02/2011    Class: Acute  . Deep vein thrombosis (DVT) ([place]) 04/02/2011    Class: Chronic    Expected Discharge Date: Expected Discharge Date: 03/03/18  Team Members Present: Physician leading conference: Dr. Delice Lesch Social Worker Present: Lennart Pall, LCSW Nurse Present: Frances Maywood, RN PT Present: Barrie Folk, PT OT Present: [name], OT     Current Status/Progress Goal Weekly Team Focus  Medical   Decreased functional mobility secondary to lumbar spondylosis with radiculopathy.S/P decompression L3-4 and L4-5 with  anterior exposure of L5-S1 as well as percutaneous fixation L3 to the sacrum 02/17/2018. Appears to have significant right L3 radiculopathy.    Improve mobility, transfers, pain, urinary retention, HTN  See above   Bowel/Bladder   cont bowel; foley cath; lbm 6/4  maintain bowel cont; void after foley cath removal 6/5  assess q shift and prn; assess for void   Swallow/Nutrition/ Hydration             ADL's   min assist stand pivot transfer with RW, min assist UB dressing, mod assist bathing, total assist LB dressing  Supervision overall  ADL retraining, adhering to back precautions during ADLs and mobility, pain management   Mobility   min assist bed mobility. min assist gait up to 10ft with RW. min assist sit<>stand and stand pivot transfers with RW. min assist small stair ascent/descent   Supervision assist overall for all functional mobility with RW  improved strength in in RLE, safety with gait and transfers. family education for safe d/c home at supervision assist.    Communication             Safety/Cognition/ Behavioral Observations            Pain   c/o pain to lower back  <4 out of 10  asses q shift and prn; norco; percocet   Skin   incision site with skin glue to area above symphis pubis, R hip, and lower back (2 parallel, vertical incisions)---all OTA  free from infection/breakdown with min assist  assessq shift and prn    Rehab Goals Patient on target to meet rehab goals: Yes *See Care Plan and progress notes for long and short-term goals.     Barriers to Discharge  Current Status/Progress Possible Resolutions Date Resolved   Physician    Medical stability     See above  Therapies, optimize pain meds, follow blaffer, optimize BP meds      Nursing                  PT  Inaccessible home environment                 OT                  SLP                SW                Discharge Planning/Teaching Needs:  Pt to d/c to her home with adult son providing 24/7  caregiver support  Teaching needs TBD   Team Discussion:  New eval;  Adjusting BP meds;  Pain a significant issues but pt working through this.  Foley d/c'd and will monitor output.  Min-mad assist with self-care and will need AE to follow back precautions.  Min assist with transfers.  Very weak hip flexors.  Supervision goals overall.  Revisions to Treatment Plan:  None    Continued Need for Acute Rehabilitation Level of Care: The patient requires daily medical management by a physician with specialized training in physical medicine and rehabilitation for the following conditions: Daily direction of a multidisciplinary physical rehabilitation program to ensure safe treatment while eliciting the highest outcome that is of practical value to the patient.: Yes Daily medical management of patient stability for increased activity during participation in an intensive rehabilitation regime.: Yes Daily analysis of laboratory values and/or radiology reports with any subsequent need for medication adjustment of medical intervention for : Post surgical problems;Neurological problems;Blood pressure problems  Raji Glinski, [place] 02/23/2018, 2:35 PM

## 2018-02-23 NOTE — Progress Notes (Signed)
ANTICOAGULATION CONSULT NOTE - Initial Consult  Pharmacy Consult for DVT Prophylaxis Indication: DVT Prophylaxis  Allergies  Allergen Reactions  . Gadolinium Derivatives Nausea And Vomiting    Pt describes this happens every time she gets gado even with slow injection  . Codeine Itching and Nausea Only  . Cymbalta [Duloxetine Hcl] Other (See Comments)    Altered mental status    Patient Measurements: Height: 5\' 11"  (180.3 cm) Weight: 262 lb (118.8 kg) IBW/kg (Calculated) : 70.8 Heparin Dosing Weight:   Vital Signs: Temp: 97.9 F (36.6 C) (06/06 0531) Temp Source: Oral (06/06 0531) BP: 148/88 (06/06 0531) Pulse Rate: 85 (06/06 0531)  Labs: Recent Labs    02/22/18 0724  HGB 8.6*  HCT 28.4*  PLT 251  CREATININE 0.75    Estimated Creatinine Clearance: 130.2 mL/min (by C-G formula based on SCr of 0.75 mg/dL).   Medical History: Past Medical History:  Diagnosis Date  . Arthritis    knees, back  . 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 Complication of anesthesia   . Depression   . History of pneumonia   . Hypertension   . Lumbar spondylosis   . PCOS (polycystic ovarian syndrome)   . Pneumonia    2016  . PONV (postoperative nausea and vomiting)     Assessment: Pt with decreased mobility secondary to lumbar spondylosis. S/Pdecompression L3-4 and L4-5 with anterior exposure of L5-S1 as well as percutaneous fixation L3 to the sacrum 02/17/2018. SCDs dc'd. Consulted to start Lovenox for VTE prophylaxis. Scr = 0.75. Hgb = 8.6. Platelets stable.   Plan:  Lovenox 60 mg (0.5 mg/kg) subQ q 24 hr based on BMI > 30.  Blenda Nicely, RPh Clinical Pharmacist 02/23/2018,8:59 AM  Phone: [phone] (8-4 PM)

## 2018-02-23 NOTE — Progress Notes (Signed)
Physical Therapy Session Note  Patient Details  Name: Ashley [name] MRN: [id number] Date of Birth: Apr 28, 1976  Today's Date: 02/23/2018 PT Individual Time: 1100-1200 AND 1515-1630   AND 60 min AND 75 min   Short Term Goals: Week 1:  PT Short Term Goal 1 (Week 1): Pt will perform bed mobility with supervision assist  PT Short Term Goal 2 (Week 1): Pt will ambulate 45f with supervision assist  PT Short Term Goal 3 (Week 1): Pt will perform WC mobility with supervision assist up to 163f PT Short Term Goal 4 (Week 1): Pt will ascend 1 step with LRAD and min assist   Skilled Therapeutic Interventions/Progress Updates:   Session 1.   Pt received supine in bed and agreeable to PT. Supine>sit transfer with supervision assist and min cues for use of bed features.. PT assisted pt to don shoes with cues to prevent breatking back precautions.   Stand pivot transfers completed x 8 throughout treatment with min assist from PT and min cues for proper UE support on RW and arm rest.   Car transfer with min assist from PT and min cues for set up and safety through transfer including AD management and back precautions.   Nustep reciprocal movement train 3 min x 2 with prolonged rest break. Pt noted to have improved activation of the RLE with increased practice, but pt reports continued pain throughout therex in hip joint.   PT instructed pt in WC mobility x 25090fith supervision assist and min cues for doorway management.   Gait training with RW x [id number] min assist from PT. Min cues from PT for improved gait pattern to increase hip flexion and decrease circumduction as well as tactile cues from PT to improve terminal knee extension.   Patient returned to room and [facility] call bell in reach and all needs met.     Session 2.   Pt received sitting in WC and agreeable to PT. Pt transported to rehab gym in WC. [facility]<>stand in parallel bars x 4 with min assist from PT. Reciprocal  stepping to 2 inch step with mod assist from PT to facilite HS and hip flexor activation.  Standing hip abduction 2x 5 BLE with cues for posture and proper ROM, reciprocal marches with BUE support on RW 2 x 8 with cues for increased hip flexion ROM to lift RLE off floor. Pt notes no pain in hip with Toe touch but significant pain hi hip with clearance of RLE from floor.   Pt instructed in sit<>stand from WC xMacy throughout treatment with min assist from PT and min cues for safety.   WC mobility through community environment x 150ft12f supervision assist for navigation of obstacles. Gait through gift shop x 88ft 88f min assist from PT and improve gait pattern noted from eariler in day.   Gait training over unlevel surface x 10ft w58fmin-mod assist from PT and moderate cues for AD management.   Pt returned to room and performed  Stand pivot transfer to bed with min assist and RW. Sit>supine completed with  Min assist , and left supine in bed with call bell in reach and all needs met.          Therapy Documentation Precautions:  Precautions Precautions: Back, Fall Precaution Comments: back precautions reviewed with pt  Restrictions Weight Bearing Restrictions: No    Vital Signs: Therapy Vitals Pulse Rate: 98 Resp: 20 BP: 122/74 Patient Position (if  appropriate): Sitting Oxygen Therapy SpO2: 100 % O2 Device: Room Air Pain: 5/10 low back and R hip.   See Function Navigator for Current Functional Status.   Therapy/Group: Individual Therapy  Lorie Phenix 02/23/2018, 5:49 PM

## 2018-02-24 ENCOUNTER — Inpatient Hospital Stay (HOSPITAL_COMMUNITY): Payer: BLUE CROSS/BLUE SHIELD | Admitting: Occupational Therapy

## 2018-02-24 ENCOUNTER — Inpatient Hospital Stay (HOSPITAL_COMMUNITY): Payer: BLUE CROSS/BLUE SHIELD | Admitting: Physical Therapy

## 2018-02-24 LAB — URINALYSIS, COMPLETE (UACMP) WITH MICROSCOPIC
Bacteria, UA: NONE SEEN
Bilirubin Urine: NEGATIVE
Glucose, UA: NEGATIVE mg/dL
Hgb urine dipstick: NEGATIVE
Ketones, ur: NEGATIVE mg/dL
Leukocytes, UA: NEGATIVE
Nitrite: NEGATIVE
Protein, ur: NEGATIVE mg/dL
Specific Gravity, Urine: 1.018 (ref 1.005–1.030)
pH: 7 (ref 5.0–8.0)

## 2018-02-24 MED FILL — Sodium Chloride IV Soln 0.9%: INTRAVENOUS | Qty: 1000 | Status: AC

## 2018-02-24 MED FILL — Heparin Sodium (Porcine) Inj 1000 Unit/ML: INTRAMUSCULAR | Qty: 30 | Status: AC

## 2018-02-24 NOTE — Progress Notes (Signed)
Urine sample send to lab. Sareena Odeh W Akshar Starnes

## 2018-02-24 NOTE — Care Management (Signed)
[facility] Individual Statement of Services  Patient Name:  SHERIAN VALENZA  Date:  02/24/2018  Welcome to the [facility].  Our goal is to provide you with an individualized program based on your diagnosis and situation, designed to meet your specific needs.  With this comprehensive rehabilitation program, you will be expected to participate in at least 3 hours of rehabilitation therapies Monday-Friday, with modified therapy programming on the weekends.  Your rehabilitation program will include the following services:  Physical Therapy (PT), Occupational Therapy (OT), 24 hour per day rehabilitation nursing, Therapeutic Recreaction (TR), Neuropsychology, Case Management (Social Worker), Rehabilitation Medicine, Nutrition Services and Pharmacy Services  Weekly team conferences will be held on Wednesdays to discuss your progress.  Your Social Worker will talk with you frequently to get your input and to update you on team discussions.  Team conferences with you and your family in attendance may also be held.  Expected length of stay: 10-14 days    Overall anticipated outcome: supervision  Depending on your progress and recovery, your program may change. Your Social Worker will coordinate services and will keep you informed of any changes. Your Social Worker's name and contact numbers are listed  below.  The following services may also be recommended but are not provided by the [facility] will be made to provide these services after discharge if needed.  Arrangements include referral to agencies that provide these services.  Your insurance has been verified to be:  [employer] Your primary doctor is:  Sports coach  Pertinent information will be shared with your doctor and your insurance company.  Social  Worker:  [place], [place] or ([phone]   Information discussed with and copy given to patient by: Lennart Pall, 02/24/2018, 8:35 AM

## 2018-02-24 NOTE — Progress Notes (Signed)
Occupational Therapy Session Note  Patient Details  Name: Ashley [name] MRN: [id number] Date of Birth: 1976-08-06  Today's Date: 02/24/2018 OT Individual Time: 1000-1100 and 1400-1455 OT Individual Time Calculation (min): 60 min and 55 min   Short Term Goals: Week 1:  OT Short Term Goal 1 (Week 1): Pt will complete LB bathing with min assist with AE as needed OT Short Term Goal 2 (Week 1): Pt will complete LB dressing with min assist with AE as needed OT Short Term Goal 3 (Week 1): Pt will complete toilet transfers with min assist with LRAD  Skilled Therapeutic Interventions/Progress Updates:    1) Treatment session with focus on ADL retraining with functional transfers and use of AE for LB bathing and dressing.  Pt received seated upright in w/c willing to engage in transfer training in ADL apt prior to bathing and dressing.  Pt propelled w/c 100' without assist.  Educated on stand pivot transfer with RW to tub bench, pt able to return demonstrate transfer with min assist and increased time to lift RLE over tub ledge without assistance.  Educated on obtaining grab bar for shower.  Returned to room where pt completed bathing and dressing from EOB to allow pt to obtain modified circle sitting with leg propped up on bed in order to wash perineal area and legs.  Utilized long handled sponge to wash lower legs and feet.  Utilized reacher to don pants this session, educated on donning RLE first as it is more difficult to lift.  Min assist sit > stand to pull pants over hips.  Returned to w/c stand pivot min assist with RW.  2) Treatment session with focus on sit <> stand and standing tolerance.  Pt received upright in w/c reporting tired from PT session but willing to engage in treatment session.  Pt propelled w/c 200' without assist, requiring assistance for remainder of distance to dayroom due to fatigue.  Engaged in sit > stand at high-low table with focus on body mechanics with sit > stand and  standing tolerance as needed to complete iADL tasks.  Pt tolerated standing 7 min, 7 min, and 4 min during session while engaging in table top task.  Pt requested seated rest breaks as needed.  Provided education on energy conservation strategies and sitting to complete tasks as needed to allow for more energy for other tasks.  Pt returned to room and completed stand pivot transfer to elevated bed to simulate height of bed at home.  Transfer completed with min assist and pt left seated EOB with nursing staff arriving to assess vitals and provide scheduled pain meds.  Therapy Documentation Precautions:  Precautions Precautions: Back, Fall Precaution Comments: back precautions reviewed with pt  Restrictions Weight Bearing Restrictions: No Pain: Pt reports pain 4/10 in lower back and Rt hip.  Premedicated.  See Function Navigator for Current Functional Status.   Therapy/Group: Individual Therapy  [name] 02/24/2018, 12:41 PM

## 2018-02-24 NOTE — Plan of Care (Signed)
  Problem: SCI BOWEL ELIMINATION Goal: RH STG MANAGE BOWEL WITH ASSISTANCE Description STG Manage Bowel with Min Assistance.  Outcome: Progressing Goal: RH STG SCI MANAGE BOWEL WITH MEDICATION WITH ASSISTANCE Description STG SCI Manage bowel with medication with Min assistance.  Outcome: Progressing Goal: RH STG SCI MANAGE BOWEL PROGRAM W/ASSIST OR AS APPROPRIATE Description STG SCI Manage bowel program w/ Min assist or as appropriate.  Outcome: Progressing Goal: RH OTHER STG BOWEL ELIMINATION GOALS W/ASSIST Description Other STG Bowel Elimination Goals With [place].  Outcome: Progressing   Problem: SCI BLADDER ELIMINATION Goal: RH STG MANAGE BLADDER WITH ASSISTANCE Description STG Manage Bladder With Min Assistance  Outcome: Progressing   Problem: RH SKIN INTEGRITY Goal: RH STG SKIN FREE OF INFECTION/BREAKDOWN Outcome: Progressing Goal: RH STG MAINTAIN SKIN INTEGRITY WITH ASSISTANCE Description STG Maintain Skin Integrity With [place].  Outcome: Progressing Goal: RH STG ABLE TO PERFORM INCISION/WOUND CARE W/ASSISTANCE Description STG Able To Perform Incision/Wound Care With [employer].  Outcome: Progressing Goal: RH OTHER STG SKIN INTEGRITY GOALS W/ASSIST Description Other STG Skin Integrity Goals With Assistance. Outcome: Progressing   Problem: RH SAFETY Goal: RH STG ADHERE TO SAFETY PRECAUTIONS W/ASSISTANCE/DEVICE Description STG Adhere to Safety Precautions With min  Assistance/Device.  Outcome: Progressing Goal: RH STG DECREASED RISK OF FALL WITH ASSISTANCE Description STG Decreased Risk of Fall With [employer].  Outcome: Progressing Goal: RH STG DEMO UNDERSTANDING HOME SAFETY PRECAUTIONS Outcome: Progressing Goal: RH OTHER STG SAFETY GOALS W/ASSIST Description Other STG Safety Goals With Assistance. Outcome: Progressing   Problem: RH PAIN MANAGEMENT Goal: RH STG PAIN MANAGED AT OR BELOW PT'S PAIN GOAL Description Patient will be  pain free or less than 3  Outcome: Progressing   Problem: RH KNOWLEDGE DEFICIT SCI Goal: RH STG INCREASE KNOWLEDGE OF SELF CARE AFTER SCI Outcome: Progressing

## 2018-02-24 NOTE — Progress Notes (Signed)
[facility] PHYSICAL MEDICINE & REHABILITATION     PROGRESS NOTE  Subjective/Complaints:  Patient seen lying in bed this morning. She states she slept fairly overnight. She offers little interaction.  ROS: Denies CP, SOB, nausea, vomiting, diarrhea.  Objective: Vital Signs: Blood pressure (!) 141/83, pulse 75, temperature 98.8 F (37.1 C), temperature source Oral, resp. rate 16, height 5\' 11"  (1.803 m), weight 118.8 kg (262 lb), last menstrual period 01/28/2018, SpO2 99 %. No results found. Recent Labs    02/22/18 0724  WBC 8.0  HGB 8.6*  HCT 28.4*  PLT 251   Recent Labs    02/22/18 0724  NA 140  K 4.2  CL 99*  GLUCOSE 85  BUN 9  CREATININE 0.75  CALCIUM 8.5*   CBG (last 3)  No results for input(s): GLUCAP in the last 72 hours.  Wt Readings from Last 3 Encounters:  02/21/18 118.8 kg (262 lb)  02/21/18 118.8 kg (262 lb)  02/08/18 119.2 kg (262 lb 12.8 oz)    Physical Exam:  BP (!) 141/83 (BP Location: Right Arm)   Pulse 75   Temp 98.8 F (37.1 C) (Oral)   Resp 16   Ht 5\' 11"  (1.803 m)   Wt 118.8 kg (262 lb)   LMP 01/28/2018   SpO2 99%   BMI 36.54 kg/m  Constitutional: She appearswell-developed.Obese HENT: Normocephalicand atraumatic.  Cardiovascular:RRR. No JVD. Respiratory:Effort normal. Clear. GI: Bowel sounds are normal. She exhibitsno distension.  Musculoskeletal:No edema or tenderness in extremities Neurological: She isalert.  Motor: B/l UE, LLE 5/5 proximal to distal.  RLE: HF 2/5, KE 1+/5, ADF 5/5  Skin:warm and dry. Incisions C/D/I Psychiatric: Flat affect..  Assessment/Plan: 1. Functional deficits secondary to lumbar spondylosis with radiculopathy.S/Pdecompression which require 3+ hours per day of interdisciplinary therapy in a comprehensive inpatient rehab setting. Physiatrist is providing close team supervision and 24 hour management of active medical problems listed below. Physiatrist and rehab team continue to assess barriers  to discharge/monitor patient progress toward functional and medical goals.  Function:  Bathing Bathing position   Position: Sitting EOB  Bathing parts Body parts bathed by patient: Right arm, Left arm, Chest, Abdomen, Front perineal area, Right upper leg, Left upper leg, Buttocks, Right lower leg, Left lower leg Body parts bathed by helper: Back  Bathing assist Assist Level: Touching or steadying assistance(Pt > 75%)      Upper Body Dressing/Undressing Upper body dressing   What is the patient wearing?: Pull over shirt/dress, Bra Bra - Perfomed by patient: Thread/unthread right bra strap, Thread/unthread left bra strap, Hook/unhook bra (pull down sports bra)   Pull over shirt/dress - Perfomed by patient: Thread/unthread right sleeve, Thread/unthread left sleeve, Put head through opening, Pull shirt over trunk          Upper body assist Assist Level: Set up   Set up : To obtain clothing/put away  Lower Body Dressing/Undressing Lower body dressing   What is the patient wearing?: Underwear, Pants Underwear - Performed by patient: Thread/unthread right underwear leg, Thread/unthread left underwear leg Underwear - Performed by helper: Pull underwear up/down Pants- Performed by patient: Thread/unthread right pants leg, Thread/unthread left pants leg Pants- Performed by helper: Pull pants up/down   Non-skid slipper socks- Performed by helper: Don/doff right sock, Don/doff left sock                  Lower body assist Assist for lower body dressing: Assistive device(Mod assist) Assistive Device Comment: Games developer  Toileting   Toileting steps completed by patient: Adjust clothing prior to toileting, Performs perineal hygiene, Adjust clothing after toileting   Toileting Assistive Devices: Grab bar or rail  Toileting assist Assist level: Touching or steadying assistance (Pt.75%)   Transfers Chair/bed transfer   Chair/bed transfer method: Stand pivot Chair/bed  transfer assist level: Touching or steadying assistance (Pt > 75%) Chair/bed transfer assistive device: Armrests, Medical sales representative     Max distance: 25ft Assist level: Touching or steadying assistance (Pt > 75%)   Wheelchair   Type: Manual Max wheelchair distance: 279ft  Assist Level: Supervision or verbal cues  Cognition Comprehension Comprehension assist level: Follows basic conversation/direction with no assist  Expression Expression assist level: Expresses complex 90% of the time/cues < 10% of the time  Social Interaction Social Interaction assist level: Interacts appropriately with others - No medications needed.  Problem Solving Problem solving assist level: Solves complex 90% of the time/cues < 10% of the time  Memory Memory assist level: Complete Independence: No helper    Medical Problem List and Plan: 1.Decreased functional mobilitysecondary to lumbar spondylosis with radiculopathy.S/Pdecompression L3-4 and L4-5 with anterior exposure of L5-S1 as well as percutaneous fixation L3 to the sacrum 02/17/2018. Appears to have significant right L3 radiculopathy.Back brace when out of bed  Continue CIR 2. DVT Prophylaxis/Anticoagulation: SCDs d/ced  Lovenox started on 6/6   Vascular study showing chronic, but not acute DVT 3. Pain Management:Neurontin 600 mg 4 times daily, baclofen 20 mg 4 times daily, Robaxin as needed  OxyContin 10 BID started on 6/4  Percocet changes to q6 PRN 4. Mood:Wellbutrin XL 300 mg daily, Zoloft 50 mg daily, Xanax 1 mg daily as needed 5. Neuropsych: This patientiscapable of making decisions on herown behalf. 6. Skin/Wound Care:Routine skin checks 7. Fluids/Electrolytes/Nutrition:Routine in and outs  BMP within acceptable range on 6/5 8.Acute blood loss anemia.   Hemoglobin 8.6 on 6/5  Labs ordered for Monday  Continue to monitor 9.Hypertension. Cozaar 50 mg daily, HCTZ 12.5 mg daily.   Controlled on  6/7 10.Morbid obesity. BMI 36.54.Follow-up dietary 11.Osteoarthritis. 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 Laxative assistance, increased on 6/5  improving 13. Urinary retention  Will DC Foley and perform voiding trial  UA/U collection remains pending, will discuss with nursing  Improving 14. Assistive fall on right knee  No overt signs of trauma  Continue to monitor  LOS (Days) 3 A FACE TO FACE EVALUATION WAS PERFORMED  Ashley [name] Ashley [name] 02/24/2018 8:01 AM

## 2018-02-24 NOTE — Progress Notes (Signed)
Physical Therapy Session Note  Patient Details  Name: Ashley [name] MRN: [id number] Date of Birth: 11/06/1975  Today's Date: 02/24/2018 PT Individual Time: 0805-0850 AND 1130-1200(make up time) AND 1300-1345 PT Individual Time Calculation (min): 45 min AND 30 min(make up time) AND 45 min   Short Term Goals: Week 1:  PT Short Term Goal 1 (Week 1): Pt will perform bed mobility with supervision assist  PT Short Term Goal 2 (Week 1): Pt will ambulate 8f with supervision assist  PT Short Term Goal 3 (Week 1): Pt will perform WC mobility with supervision assist up to 143f PT Short Term Goal 4 (Week 1): Pt will ascend 1 step with LRAD and min assist   Skilled Therapeutic Interventions/Progress Updates:   Pt received sitting in WC and agreeable to PT  WC mobility x 15060fith supervision assist from PT, and min cues for obstacle avoidance in congested areas.   PT instructed pt in gait training x 55f62fth min assist. Min cues for step length and improved hip/knee flexion   Sit<>stand and stand pivot transfers with RW x 2 each with min assist from PT.   Sit<>supine with min assist to control the RLE.  NMES performed to the Rquad with empi Large muscle atrophy setting, 26 amp, 10 sec off, 10 sec on x 8 min with AAROM from PT for knee extension. Mild activation of quads noted with increased time.   Patient returned to room and [facility] in reach and all needs met.     Session 2.   Pt received sitting in WC and agreeable to PT  Pt transported to rehab gym in WC. [facility] instructed pt in standing therex in parallel bars mini sqauts, hip abduction, HS curl, reciprocal marches, and calf raises.   Dynamic gait training in parallel bars: forward/backward 2 x 10ft18fh direction , side stepping 2 x 10 ft bil. Pt noted to have 2 instances of R knee instability with backward gait, but able to prevent fall with UE support on parallel bars without increased assist frmo PT. Min  assist throughout from PT for safety with CGA to stimulate knee extension in stance.   WC mobility x 150ft 63f supervision assist from PT.   Patient returned to room and [facility] reach and all needs met.     Session 3.   Pt received sitting in WC and agreeable to PT  Pt transported to rehab gym in WC.  [facility] with RW and min assist x 145ft w21fmin assist overall. One instance of R knee instability at 130ft re21fing mod assist to prevent fall. Additional gait training x 180ft wit70f and min assist from PT.   Nustep reciprocal movement training to force use of the RLE knee extensors.  2 x 4 min level 1>2 with cues for hip control. Pt noted to have improved activation of RLE extensors on this day.   Throughout treatment, pt performed sit<>stand and stand pivot transfers with min assist from PT with BUE support on RW and WC arm rest  Patient returned to room and left sitting in WC [facility] in reach and all needs met.          Therapy Documentation Precautions:  Precautions Precautions: Back, Fall Precaution Comments: back precautions reviewed with pt  Restrictions Weight Bearing Restrictions: No General:   Vital Signs: Therapy Vitals Temp: 98.8 F (37.1 C) Temp Source: Oral  Pulse Rate: 75 Resp: 16 BP: (!) 141/83 Patient Position (if appropriate): Lying Oxygen Therapy SpO2: 99 % O2 Device: Room Air Pain: 4/10 low back. Ache   See Function Navigator for Current Functional Status.   Therapy/Group: Individual Therapy  Lorie Phenix 02/24/2018, 8:52 AM

## 2018-02-25 ENCOUNTER — Inpatient Hospital Stay (HOSPITAL_COMMUNITY): Payer: BLUE CROSS/BLUE SHIELD

## 2018-02-25 ENCOUNTER — Inpatient Hospital Stay (HOSPITAL_COMMUNITY): Payer: BLUE CROSS/BLUE SHIELD | Admitting: Occupational Therapy

## 2018-02-25 ENCOUNTER — Inpatient Hospital Stay (HOSPITAL_COMMUNITY): Payer: BLUE CROSS/BLUE SHIELD | Admitting: *Deleted

## 2018-02-25 LAB — URINE CULTURE: Culture: <10000 — AB

## 2018-02-25 NOTE — Progress Notes (Signed)
[facility] PHYSICAL MEDICINE & REHABILITATION     PROGRESS NOTE  Subjective/Complaints:  Patient in bed.  Frustrated by lack of perceived progress.  Still having pain and weakness in her right upper leg.  ROS: Patient denies fever, rash, sore throat, blurred vision, nausea, vomiting, diarrhea, cough, shortness of breath or chest pain, joint or back pain, headache, or mood change.   Objective: Vital Signs: Blood pressure 128/78, pulse 87, temperature 98.5 F (36.9 C), temperature source Oral, resp. rate 20, height 5\' 11"  (1.803 m), weight 118.8 kg (262 lb), last menstrual period 01/28/2018, SpO2 100 %. No results found. No results for input(s): WBC, HGB, HCT, PLT in the last 72 hours. No results for input(s): NA, K, CL, GLUCOSE, BUN, CREATININE, CALCIUM in the last 72 hours.  Invalid input(s): CO CBG (last 3)  No results for input(s): GLUCAP in the last 72 hours.  Wt Readings from Last 3 Encounters:  02/21/18 118.8 kg (262 lb)  02/21/18 118.8 kg (262 lb)  02/08/18 119.2 kg (262 lb 12.8 oz)    Physical Exam:  BP 128/78 (BP Location: Right Arm)   Pulse 87   Temp 98.5 F (36.9 C) (Oral)   Resp 20   Ht 5\' 11"  (1.803 m)   Wt 118.8 kg (262 lb)   LMP 01/28/2018   SpO2 100%   BMI 36.54 kg/m  Constitutional: No distress . Vital signs reviewed. HEENT: EOMI, oral membranes moist Neck: supple Cardiovascular: RRR without murmur. No JVD    Respiratory: CTA Bilaterally without wheezes or rales. Normal effort    GI: BS +, non-tender, non-distended  Musculoskeletal:No edema or tenderness in extremities Neurological: She isalert.  Motor: B/l UE, LLE 5/5 proximal to distal.  RLE: HF 2/5, KE 1+/5, ADF 5/5  Skin:warm and dry. Incisions C/D/I Psychiatric: Flat affect..  Assessment/Plan: 1. Functional deficits secondary to lumbar spondylosis with radiculopathy.S/Pdecompression which require 3+ hours per day of interdisciplinary therapy in a comprehensive inpatient rehab  setting. Physiatrist is providing close team supervision and 24 hour management of active medical problems listed below. Physiatrist and rehab team continue to assess barriers to discharge/monitor patient progress toward functional and medical goals.  Function:  Bathing Bathing position   Position: Sitting EOB  Bathing parts Body parts bathed by patient: Right arm, Left arm, Chest, Abdomen, Front perineal area, Right upper leg, Left upper leg, Buttocks, Right lower leg, Left lower leg Body parts bathed by helper: Back  Bathing assist Assist Level: Touching or steadying assistance(Pt > 75%)      Upper Body Dressing/Undressing Upper body dressing   What is the patient wearing?: Pull over shirt/dress, Bra Bra - Perfomed by patient: Thread/unthread right bra strap, Thread/unthread left bra strap, Hook/unhook bra (pull down sports bra)   Pull over shirt/dress - Perfomed by patient: Thread/unthread right sleeve, Thread/unthread left sleeve, Put head through opening, Pull shirt over trunk          Upper body assist Assist Level: Set up   Set up : To obtain clothing/put away  Lower Body Dressing/Undressing Lower body dressing   What is the patient wearing?: Underwear, Pants Underwear - Performed by patient: Thread/unthread right underwear leg, Thread/unthread left underwear leg, Pull underwear up/down Underwear - Performed by helper: Pull underwear up/down Pants- Performed by patient: Thread/unthread right pants leg, Thread/unthread left pants leg Pants- Performed by helper: Pull pants up/down   Non-skid slipper socks- Performed by helper: Don/doff right sock, Don/doff left sock  Lower body assist Assist for lower body dressing: Assistive device, Touching or steadying assistance (Pt > 75%)(Min assist) Assistive Device Comment: reacher    Toileting Toileting   Toileting steps completed by patient: Adjust clothing prior to toileting, Performs perineal hygiene,  Adjust clothing after toileting Toileting steps completed by helper: Adjust clothing prior to toileting, Performs perineal hygiene, Adjust clothing after toileting(per Starlyn Skeans, NT report) Toileting Assistive Devices: Grab bar or rail  Toileting assist Assist level: Touching or steadying assistance (Pt.75%)   Transfers Chair/bed transfer   Chair/bed transfer method: Stand pivot Chair/bed transfer assist level: Touching or steadying assistance (Pt > 75%) Chair/bed transfer assistive device: Armrests, Medical sales representative     Max distance: 174ft Assist level: Touching or steadying assistance (Pt > 75%)   Wheelchair   Type: Manual Max wheelchair distance: 146ft  Assist Level: Supervision or verbal cues  Cognition Comprehension Comprehension assist level: Follows basic conversation/direction with no assist  Expression Expression assist level: Expresses basic needs/ideas: With no assist  Social Interaction Social Interaction assist level: Interacts appropriately with others - No medications needed.  Problem Solving Problem solving assist level: Solves basic 90% of the time/requires cueing < 10% of the time  Memory Memory assist level: Complete Independence: No helper    Medical Problem List and Plan: 1.Decreased functional mobilitysecondary to lumbar spondylosis with radiculopathy.S/Pdecompression L3-4 and L4-5 with anterior exposure of L5-S1 as well as percutaneous fixation L3 to the sacrum 02/17/2018. Appears to have significant right L3 radiculopathy.Back brace when out of bed  Continue CIR  -Provided patient positive reinforcement but also some realistic expectations regarding recovery and time course 2. DVT Prophylaxis/Anticoagulation: SCDs d/ced  Lovenox started on 6/6   Vascular study showing chronic, but not acute DVT 3. Pain Management:Neurontin 600 mg 4 times daily, baclofen 20 mg 4 times daily, Robaxin as needed  OxyContin 10 BID started on  6/4  Percocet changes to q6 PRN  -Regular ice to right thigh 4. Mood:Wellbutrin XL 300 mg daily, Zoloft 50 mg daily, Xanax 1 mg daily as needed 5. Neuropsych: This patientiscapable of making decisions on herown behalf. 6. Skin/Wound Care:Routine skin checks 7. Fluids/Electrolytes/Nutrition:Routine in and outs  BMP within acceptable range on 6/5 8.Acute blood loss anemia.   Hemoglobin 8.6 on 6/5  Labs ordered for Monday  Continue to monitor 9.Hypertension. Cozaar 50 mg daily, HCTZ 12.5 mg daily.   Controlled on 6/7 10.Morbid obesity. BMI 36.54.Follow-up dietary 11.Osteoarthritis. 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 Laxative assistance, increased on 6/5  improving 13. Urinary retention  Voiding trial   -Urinalysis negative, urine culture pending  14. Assisted fall on right knee  No overt signs of trauma  Continue to monitor  LOS (Days) 4 A FACE TO FACE EVALUATION WAS PERFORMED  Meredith Staggers 02/25/2018 7:39 AM

## 2018-02-25 NOTE — Progress Notes (Addendum)
ANTICOAGULATION CONSULT NOTE - Initial Consult  Pharmacy Consult for DVT Prophylaxis Indication: DVT Prophylaxis  Allergies  Allergen Reactions  . Gadolinium Derivatives Nausea And Vomiting    Pt describes this happens every time she gets gado even with slow injection  . Codeine Itching and Nausea Only  . Cymbalta [Duloxetine Hcl] Other (See Comments)    Altered mental status    Patient Measurements: Height: 5\' 11"  (180.3 cm) Weight: 262 lb (118.8 kg) IBW/kg (Calculated) : 70.8 Heparin Dosing Weight:   Vital Signs: Temp: 98.5 F (36.9 C) (06/08 0646) Temp Source: Oral (06/08 0646) BP: 128/78 (06/08 0646) Pulse Rate: 87 (06/08 0646)  Labs: No results for input(s): HGB, HCT, PLT, APTT, LABPROT, INR, HEPARINUNFRC, HEPRLOWMOCWT, CREATININE, CKTOTAL, CKMB, TROPONINI in the last 72 hours.  Estimated Creatinine Clearance: 130.2 mL/min (by C-G formula based on SCr of 0.75 mg/dL).   Medical History: Past Medical History:  Diagnosis Date  . Arthritis    knees, back  . 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 Complication of anesthesia   . Depression   . History of pneumonia   . Hypertension   . Lumbar spondylosis   . PCOS (polycystic ovarian syndrome)   . Pneumonia    2016  . PONV (postoperative nausea and vomiting)     Assessment: Pt with decreased mobility secondary to lumbar spondylosis. S/Pdecompression L3-4 and L4-5 with anterior exposure of L5-S1 as well as percutaneous fixation L3 to the sacrum 02/17/2018. SCDs dc'd. Started lovenox for VTE prophylaxis.  Labs 6/1: Scr = 0.75. Hgb = 8.6. Platelets stable. No bleeding reported.    Plan:  Lovenox 60 mg (0.5 mg/kg) subQ q 24 hr based on BMI > 30.  Leroy Libman, PharmD Pharmacy Resident Phone: [phone]

## 2018-02-25 NOTE — Progress Notes (Signed)
Occupational Therapy Session Note  Patient Details  Name: GEARLINE SPILMAN MRN: [id number] Date of Birth: 09-06-1976  Today's Date: 02/25/2018 OT Group Time: 1100-1200 OT Group Time Calculation (min): 60 min   Skilled Therapeutic Interventions/Progress Updates:    Pt participated in therapeutic w/c level dance group with focus on UE/LE strengthening, activity tolerance, and social participation for carryover during self care tasks. Pt was guided through various dance-based exercises involving UB/LB and trunk. Pt required bilateral UEs to incorporate R LE, however motivated to do so in order to improve functional mobility. She engaged with group members throughout session, requested songs, and shared personal challenges regarding her CIR journey. The other group members provided emotional support and encouragement during this time, which noticeably improved affect. Afterwards she was left in room with all needs and son present.   Therapy Documentation Precautions:  Precautions Precautions: Back, Fall Precaution Comments: back precautions reviewed with pt  Restrictions Weight Bearing Restrictions: No Pain: Pain Assessment Pain Score: 4  ADL:   See Function Navigator for Current Functional Status.   Therapy/Group: Group Therapy  Cormac Wint A Jetta Murray 02/25/2018, 12:34 PM

## 2018-02-25 NOTE — Progress Notes (Signed)
Physical Therapy Session Note  Patient Details  Name: Ashley [name] MRN: [id number] Date of Birth: 09/19/1976  Today's Date: 02/25/2018 PT Individual Time: 0805-0902 PT Individual Time Calculation (min): 57 min   Short Term Goals: Week 1:  PT Short Term Goal 1 (Week 1): Pt will perform bed mobility with supervision assist  PT Short Term Goal 2 (Week 1): Pt will ambulate 19ft with supervision assist  PT Short Term Goal 3 (Week 1): Pt will perform WC mobility with supervision assist up to 21ft  PT Short Term Goal 4 (Week 1): Pt will ascend 1 step with LRAD and min assist   Skilled Therapeutic Interventions/Progress Updates:    Pt up in [facility], ready to go, but back brace not on. Pt reminded of importance.  Stand-step transfer WC to/from toilet with Min A to steady and safety cues. Decreased WB on RLE noted.  Pt propel WC room<>gym with distant S.   Sit<>stand to RW with min A x3 for forced use RLE to rise and lower with cues and demo.  Sit>sidelying with S, increased effort.   Sidelying therex for gravity-eliminated strengthening, each 3x10 with rest breaks and AAROM prn - knee ext - hip flexion - clamshells  - Hip flexor stretch 2x75min  Pt able to complete near full AROM in gravity eliminated position with support for clearance only and mirror for visual feedback.   Gait training with RW x 50[' with Min A and WC follow with cues for RLE activation in stance with cues for glutes due to min quad activation.   Pt left up in [facility] with all needs in reach.   Continue per PT POC for RLE strengthening/NMR, gait, and safety.   Therapy Documentation Precautions:  Precautions Precautions: Back, Fall Precaution Comments: back precautions reviewed with pt  Restrictions Weight Bearing Restrictions: No Pain: 4./5/10 at start of tx 5.5/10 at end of tx, repositioned and ice and heat pack provided.     See Function Navigator for Current Functional Status.   Therapy/Group: Individual  Therapy  Kysha Muralles Soundra Pilon, PT, DPT  02/25/2018, 8:11 AM

## 2018-02-25 NOTE — Progress Notes (Signed)
Occupational Therapy Session Note  Patient Details  Name: Ashley [name] MRN: [id number] Date of Birth: 19-Nov-1975  Today's Date: 02/25/2018 OT Individual Time: 1300-1415 OT Individual Time Calculation (min): 75 min    Short Term Goals: Week 1:  OT Short Term Goal 1 (Week 1): Pt will complete LB bathing with min assist with AE as needed OT Short Term Goal 2 (Week 1): Pt will complete LB dressing with min assist with AE as needed OT Short Term Goal 3 (Week 1): Pt will complete toilet transfers with min assist with LRAD  Skilled Therapeutic Interventions/Progress Updates:    1;1. Pt requesting to wash up and change clothes this session. Pt completes bathing and dressing from sit to stand level from w/c at  Tray table. Pt washes and dresses UB with set up with VC for donning brace. Pt completes LB bathing with AE to wash feet. OT washes back and buttocks in standing with min A for steadying and Vc for foot placement prior to sit to stand with RW. Pt Usees AE with mod cueing for technique to use reacher, sock aide and shoe funnel to don LB clothing with min A at RW in standing for clothing management. Ot installs elastic shoe laces into shoes to eliminate fastening. Pt completes tub transfer with RW with MIN A for steadying and VC for hand placement when standing from TTB to get back in w/c. Exited session with pt seated in recliner with call light in reach and all needs met.   Therapy Documentation Precautions:  Precautions Precautions: Back, Fall Precaution Comments: back precautions reviewed with pt  Restrictions Weight Bearing Restrictions: No General:    See Function Navigator for Current Functional Status.   Therapy/Group: Individual Therapy  [name] 02/25/2018, 1:14 PM

## 2018-02-26 ENCOUNTER — Inpatient Hospital Stay (HOSPITAL_COMMUNITY): Payer: BLUE CROSS/BLUE SHIELD

## 2018-02-26 NOTE — Progress Notes (Signed)
Occupational Therapy Session Note  Patient Details  Name: Ashley [name] MRN: [id number] Date of Birth: Jul 06, 1976  Today's Date: 02/26/2018 OT Individual Time: 0700-0757 OT Individual Time Calculation (min): 57 min    Short Term Goals: Week 1:  OT Short Term Goal 1 (Week 1): Pt will complete LB bathing with min assist with AE as needed OT Short Term Goal 2 (Week 1): Pt will complete LB dressing with min assist with AE as needed OT Short Term Goal 3 (Week 1): Pt will complete toilet transfers with min assist with LRAD  Skilled Therapeutic Interventions/Progress Updates:    1:1. Pt reporting pain in back however RN already administered medication and pt ready to participate in tx. RN cleared pt to shower without incisions covered d/t skin glue. Pt excited to shower. Pt stand pivot transfer EOB>w/c<>TTB with supervision using RW or grab bar. Pt completes bathing at seated level for all body parts with supervision using lateral leans to wash buttocks. Pt completes oral care at tray table with set up. Pt dons pull over shirt with set up and brace with A to place behind back. Pt usese reacher and sock aide wihtout VC. Pt sit to stand with CGA for clothing management. LSO donned for all transfers. OT applies glove to LHSS to allow pt apply lotion to feet. Exited session iwht pt seated in w/c, call light in reach and breakfast tray set up  Therapy Documentation Precautions:  Precautions Precautions: Back, Fall Precaution Comments: back precautions reviewed with pt  Restrictions Weight Bearing Restrictions: No  See Function Navigator for Current Functional Status.   Therapy/Group: Individual Therapy  [name] 02/26/2018, 7:14 AM

## 2018-02-26 NOTE — Plan of Care (Signed)
  Problem: SCI BOWEL ELIMINATION Goal: RH STG MANAGE BOWEL WITH ASSISTANCE Description STG Manage Bowel with Min Assistance.  Outcome: Progressing Goal: RH STG SCI MANAGE BOWEL WITH MEDICATION WITH ASSISTANCE Description STG SCI Manage bowel with medication with Min assistance.  Outcome: Progressing Goal: RH STG SCI MANAGE BOWEL PROGRAM W/ASSIST OR AS APPROPRIATE Description STG SCI Manage bowel program w/ Min assist or as appropriate.  Outcome: Progressing Goal: RH OTHER STG BOWEL ELIMINATION GOALS W/ASSIST Description Other STG Bowel Elimination Goals With [place].  Outcome: Progressing   Problem: SCI BLADDER ELIMINATION Goal: RH STG MANAGE BLADDER WITH ASSISTANCE Description STG Manage Bladder With Min Assistance  Outcome: Progressing   Problem: RH SKIN INTEGRITY Goal: RH STG SKIN FREE OF INFECTION/BREAKDOWN Outcome: Progressing Goal: RH STG MAINTAIN SKIN INTEGRITY WITH ASSISTANCE Description STG Maintain Skin Integrity With [place].  Outcome: Progressing Goal: RH STG ABLE TO PERFORM INCISION/WOUND CARE W/ASSISTANCE Description STG Able To Perform Incision/Wound Care With [employer].  Outcome: Progressing Goal: RH OTHER STG SKIN INTEGRITY GOALS W/ASSIST Description Other STG Skin Integrity Goals With Assistance. Outcome: Progressing   Problem: RH SAFETY Goal: RH STG ADHERE TO SAFETY PRECAUTIONS W/ASSISTANCE/DEVICE Description STG Adhere to Safety Precautions With min  Assistance/Device.  Outcome: Progressing Goal: RH STG DECREASED RISK OF FALL WITH ASSISTANCE Description STG Decreased Risk of Fall With [employer].  Outcome: Progressing Goal: RH STG DEMO UNDERSTANDING HOME SAFETY PRECAUTIONS Outcome: Progressing Goal: RH OTHER STG SAFETY GOALS W/ASSIST Description Other STG Safety Goals With Assistance. Outcome: Progressing   Problem: RH PAIN MANAGEMENT Goal: RH STG PAIN MANAGED AT OR BELOW PT'S PAIN GOAL Description Patient will be  pain free or less than 3  Outcome: Progressing   Problem: RH KNOWLEDGE DEFICIT SCI Goal: RH STG INCREASE KNOWLEDGE OF SELF CARE AFTER SCI Outcome: Progressing

## 2018-02-26 NOTE — Progress Notes (Signed)
PHYSICAL MEDICINE & REHABILITATION     PROGRESS NOTE  Subjective/Complaints:  Patient up with therapy already and in the bathroom.  Right upper leg pain and weakness persists  ROS: Patient denies fever, rash, sore throat, blurred vision, nausea, vomiting, diarrhea, cough, shortness of breath or chest pain, joint or back pain, headache, or mood change.   Objective: Vital Signs: Blood pressure 128/79, pulse 88, temperature 98.6 F (37 C), temperature source Oral, resp. rate 18, height 5\' 11"  (1.803 m), weight 118.8 kg (262 lb), last menstrual period 01/28/2018, SpO2 97 %. No results found. No results for input(s): WBC, HGB, HCT, PLT in the last 72 hours. No results for input(s): NA, K, CL, GLUCOSE, BUN, CREATININE, CALCIUM in the last 72 hours.  Invalid input(s): CO CBG (last 3)  No results for input(s): GLUCAP in the last 72 hours.  Wt Readings from Last 3 Encounters:  02/21/18 118.8 kg (262 lb)  02/21/18 118.8 kg (262 lb)  02/08/18 119.2 kg (262 lb 12.8 oz)    Physical Exam:  BP 128/79 (BP Location: Right Arm)   Pulse 88   Temp 98.6 F (37 C) (Oral)   Resp 18   Ht 5\' 11"  (1.803 m)   Wt 118.8 kg (262 lb)   LMP 01/28/2018   SpO2 97%   BMI 36.54 kg/m  Constitutional: No distress . Vital signs reviewed. HEENT: EOMI, oral membranes moist Neck: supple Cardiovascular: RRR without murmur. No JVD    Respiratory: CTA Bilaterally without wheezes or rales. Normal effort    GI: BS +, non-tender, non-distended  Musculoskeletal:No edema or tenderness in extremities Neurological: She isalert.  Motor: B/l UE, LLE 5/5 proximal to distal.  RLE: HF 2/5, KE 1+/5, ADF 5/5  Skin:warm and dry.  Incisions remain clean dry and intact Psychiatric: Flat affect..  Assessment/Plan: 1. Functional deficits secondary to lumbar spondylosis with radiculopathy.S/Pdecompression which require 3+ hours per day of interdisciplinary therapy in a comprehensive inpatient rehab  setting. Physiatrist is providing close team supervision and 24 hour management of active medical problems listed below. Physiatrist and rehab team continue to assess barriers to discharge/monitor patient progress toward functional and medical goals.  Function:  Bathing Bathing position   Position: Sitting EOB  Bathing parts Body parts bathed by patient: Right arm, Left arm, Chest, Abdomen, Front perineal area, Right upper leg, Left upper leg, Buttocks, Right lower leg, Left lower leg Body parts bathed by helper: Back  Bathing assist Assist Level: Touching or steadying assistance(Pt > 75%)      Upper Body Dressing/Undressing Upper body dressing   What is the patient wearing?: Pull over shirt/dress, Bra Bra - Perfomed by patient: Thread/unthread right bra strap, Thread/unthread left bra strap, Hook/unhook bra (pull down sports bra)   Pull over shirt/dress - Perfomed by patient: Thread/unthread right sleeve, Thread/unthread left sleeve, Put head through opening, Pull shirt over trunk          Upper body assist Assist Level: Set up   Set up : To obtain clothing/put away  Lower Body Dressing/Undressing Lower body dressing   What is the patient wearing?: Underwear, Pants Underwear - Performed by patient: Thread/unthread right underwear leg, Thread/unthread left underwear leg, Pull underwear up/down Underwear - Performed by helper: Pull underwear up/down Pants- Performed by patient: Thread/unthread right pants leg, Thread/unthread left pants leg Pants- Performed by helper: Pull pants up/down   Non-skid slipper socks- Performed by helper: Don/doff right sock, Don/doff left sock  Lower body assist Assist for lower body dressing: Assistive device, Touching or steadying assistance (Pt > 75%)(Min assist) Assistive Device Comment: reacher    Toileting Toileting   Toileting steps completed by patient: Adjust clothing prior to toileting, Performs perineal hygiene,  Adjust clothing after toileting Toileting steps completed by helper: Adjust clothing prior to toileting, Performs perineal hygiene, Adjust clothing after toileting(per Starlyn Skeans, NT report) Toileting Assistive Devices: Grab bar or rail  Toileting assist Assist level: Supervision or verbal cues   Transfers Chair/bed transfer   Chair/bed transfer method: Stand pivot Chair/bed transfer assist level: Touching or steadying assistance (Pt > 75%) Chair/bed transfer assistive device: Armrests, Medical sales representative     Max distance: 50 Assist level: Touching or steadying assistance (Pt > 75%)   Wheelchair   Type: Manual Max wheelchair distance: 138ft  Assist Level: Supervision or verbal cues  Cognition Comprehension Comprehension assist level: Follows basic conversation/direction with no assist  Expression Expression assist level: Expresses basic needs/ideas: With no assist  Social Interaction Social Interaction assist level: Interacts appropriately with others - No medications needed.  Problem Solving Problem solving assist level: Solves basic 90% of the time/requires cueing < 10% of the time  Memory Memory assist level: Complete Independence: No helper    Medical Problem List and Plan: 1.Decreased functional mobilitysecondary to lumbar spondylosis with radiculopathy.S/Pdecompression L3-4 and L4-5 with anterior exposure of L5-S1 as well as percutaneous fixation L3 to the sacrum 02/17/2018. Appears to have significant right L3 radiculopathy.Back brace when out of bed  Continue CIR  -Team to provide ongoing positive reinforcement regarding recovery and rehab 2. DVT Prophylaxis/Anticoagulation: SCDs d/ced  Lovenox started on 6/6   Vascular study showing chronic, but not acute DVT 3. Pain Management:Neurontin 600 mg 4 times daily, baclofen 20 mg 4 times daily, Robaxin as needed  OxyContin 10 BID started on 6/4  Percocet changes to q6 PRN  -Continue regular ice to  right thigh 4. Mood:Wellbutrin XL 300 mg daily, Zoloft 50 mg daily, Xanax 1 mg daily as needed 5. Neuropsych: This patientiscapable of making decisions on herown behalf. 6. Skin/Wound Care:Routine skin checks 7. Fluids/Electrolytes/Nutrition:Routine in and outs  BMP within acceptable range on 6/5 8.Acute blood loss anemia.   Hemoglobin 8.6 on 6/5  Labs ordered for Monday  Continue to monitor 9.Hypertension. Cozaar 50 mg daily, HCTZ 12.5 mg daily.   Controlled on 6/7 10.Morbid obesity. BMI 36.54.Follow-up dietary 11.Osteoarthritis. 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 Laxative assistance, increased on 6/5  improving 13. Urinary retention  Voiding trial   -Urine culture with insignificant growth 14. Assisted fall on right knee  No overt signs of trauma  Continue to monitor  LOS (Days) 5 A FACE TO FACE EVALUATION WAS PERFORMED  Ashley [name] 02/26/2018 7:36 AM

## 2018-02-27 ENCOUNTER — Inpatient Hospital Stay (HOSPITAL_COMMUNITY): Payer: BLUE CROSS/BLUE SHIELD

## 2018-02-27 ENCOUNTER — Inpatient Hospital Stay (HOSPITAL_COMMUNITY): Payer: BLUE CROSS/BLUE SHIELD | Admitting: Physical Therapy

## 2018-02-27 ENCOUNTER — Inpatient Hospital Stay (HOSPITAL_COMMUNITY): Payer: BLUE CROSS/BLUE SHIELD | Admitting: Occupational Therapy

## 2018-02-27 LAB — CBC WITH DIFFERENTIAL/PLATELET
Abs Immature Granulocytes: 0.1 10*3/uL (ref 0.0–0.1)
Basophils Absolute: 0 10*3/uL (ref 0.0–0.1)
Basophils Relative: 1 %
Eosinophils Absolute: 0.3 10*3/uL (ref 0.0–0.7)
Eosinophils Relative: 4 %
HCT: 32.4 % — ABNORMAL LOW (ref 36.0–46.0)
Hemoglobin: 9.9 g/dL — ABNORMAL LOW (ref 12.0–15.0)
Immature Granulocytes: 1 %
Lymphocytes Relative: 18 %
Lymphs Abs: 1.5 10*3/uL (ref 0.7–4.0)
MCH: 24.8 pg — ABNORMAL LOW (ref 26.0–34.0)
MCHC: 30.6 g/dL (ref 30.0–36.0)
MCV: 81.2 fL (ref 78.0–100.0)
Monocytes Absolute: 0.6 10*3/uL (ref 0.1–1.0)
Monocytes Relative: 8 %
Neutro Abs: 5.4 10*3/uL (ref 1.7–7.7)
Neutrophils Relative %: 68 %
Platelets: 323 10*3/uL (ref 150–400)
RBC: 3.99 MIL/uL (ref 3.87–5.11)
RDW: 16.3 % — ABNORMAL HIGH (ref 11.5–15.5)
WBC: 7.9 10*3/uL (ref 4.0–10.5)

## 2018-02-27 MED ORDER — LIDOCAINE 5 % EX PTCH
2.0000 | MEDICATED_PATCH | CUTANEOUS | Status: DC
  Administered 2018-02-27 – 2018-03-07 (×9): 2 via TRANSDERMAL
  Filled 2018-02-27 (×9): qty 2

## 2018-02-27 MED ORDER — BACLOFEN 20 MG PO TABS
30.0000 mg | ORAL_TABLET | Freq: Four times a day (QID) | ORAL | Status: DC
  Administered 2018-02-27 – 2018-03-07 (×32): 30 mg via ORAL
  Filled 2018-02-27 (×33): qty 1

## 2018-02-27 MED ORDER — GABAPENTIN 300 MG PO CAPS
900.0000 mg | ORAL_CAPSULE | Freq: Three times a day (TID) | ORAL | Status: DC
  Administered 2018-02-27 – 2018-03-02 (×9): 900 mg via ORAL
  Filled 2018-02-27 (×9): qty 3

## 2018-02-27 NOTE — Progress Notes (Addendum)
[place] PHYSICAL MEDICINE & REHABILITATION     PROGRESS NOTE  Subjective/Complaints:  Patient seen sitting up in bed this morning. Discussed patient's pain with nursing. Patient also complains about pain in right lower extremity staying there is worse and has migrated from previous location of pain. She states she needs more pain medications. Had prolonged discussion with patient regarding previous medications and adjustments that have been made, however patient states she still needs more. Discussed with therapies last week and patient able to tolerate as well as perform therapies and not limited by pain.  ROS: + right lower extremity pain. Denies CP, SOB, nausea, vomiting, diarrhea.  Objective: Vital Signs: Blood pressure 128/66, pulse 91, temperature 98.8 F (37.1 C), temperature source Oral, resp. rate 18, height 5\' 11"  (1.803 m), weight 118.8 kg (262 lb), last menstrual period 01/28/2018, SpO2 98 %. No results found. Recent Labs    02/27/18 0734  WBC 7.9  HGB 9.9*  HCT 32.4*  PLT 323   No results for input(s): NA, K, CL, GLUCOSE, BUN, CREATININE, CALCIUM in the last 72 hours.  Invalid input(s): CO CBG (last 3)  No results for input(s): GLUCAP in the last 72 hours.  Wt Readings from Last 3 Encounters:  02/21/18 118.8 kg (262 lb)  02/21/18 118.8 kg (262 lb)  02/08/18 119.2 kg (262 lb 12.8 oz)    Physical Exam:  BP 128/66 (BP Location: Right Arm)   Pulse 91   Temp 98.8 F (37.1 C) (Oral)   Resp 18   Ht 5\' 11"  (1.803 m)   Wt 118.8 kg (262 lb)   LMP 01/28/2018   SpO2 98%   BMI 36.54 kg/m  Constitutional: No distress . Vital signs reviewed. HENT: Normocephalic.  Atraumatic. Eyes: EOMI. No discharge. Cardiovascular: RRR. No JVD. Respiratory: CTA Bilaterally. Normal effort. GI: BS +. Non-distended. Musc: proximal right lower extremity tenderness Neurological: She isalert.  Motor: B/l UE, LLE 5/5 proximal to distal.  RLE: HF 2/5, KE 1+/5, ADF 5/5  (unchanged) Skin:warm and dry.  Incisions remain clean dry and intact Psychiatric: Flat affect..  Assessment/Plan: 1. Functional deficits secondary to lumbar spondylosis with radiculopathy.S/Pdecompression which require 3+ hours per day of interdisciplinary therapy in a comprehensive inpatient rehab setting. Physiatrist is providing close team supervision and 24 hour management of active medical problems listed below. Physiatrist and rehab team continue to assess barriers to discharge/monitor patient progress toward functional and medical goals.  Function:  Bathing Bathing position   Position: Sitting EOB  Bathing parts Body parts bathed by patient: Right arm, Left arm, Chest, Abdomen, Front perineal area, Right upper leg, Left upper leg, Buttocks, Right lower leg, Left lower leg Body parts bathed by helper: Back  Bathing assist Assist Level: Touching or steadying assistance(Pt > 75%)      Upper Body Dressing/Undressing Upper body dressing   What is the patient wearing?: Pull over shirt/dress, Bra Bra - Perfomed by patient: Thread/unthread right bra strap, Thread/unthread left bra strap, Hook/unhook bra (pull down sports bra)   Pull over shirt/dress - Perfomed by patient: Thread/unthread right sleeve, Thread/unthread left sleeve, Put head through opening, Pull shirt over trunk          Upper body assist Assist Level: Set up   Set up : To obtain clothing/put away  Lower Body Dressing/Undressing Lower body dressing   What is the patient wearing?: Underwear, Pants Underwear - Performed by patient: Thread/unthread right underwear leg, Thread/unthread left underwear leg, Pull underwear up/down Underwear - Performed by helper:  Pull underwear up/down Pants- Performed by patient: Thread/unthread right pants leg, Thread/unthread left pants leg Pants- Performed by helper: Pull pants up/down   Non-skid slipper socks- Performed by helper: Don/doff right sock, Don/doff left sock                   Lower body assist Assist for lower body dressing: Assistive device, Touching or steadying assistance (Pt > 75%)(Min assist) Assistive Device Comment: Engineer, maintenance (IT)   Toileting steps completed by patient: Adjust clothing prior to toileting, Performs perineal hygiene, Adjust clothing after toileting Toileting steps completed by helper: Adjust clothing prior to toileting, Performs perineal hygiene, Adjust clothing after toileting(per Starlyn Skeans, NT report) Toileting Assistive Devices: Grab bar or rail  Toileting assist Assist level: Touching or steadying assistance (Pt.75%), More than reasonable time   Transfers Chair/bed transfer   Chair/bed transfer method: Stand pivot Chair/bed transfer assist level: Touching or steadying assistance (Pt > 75%) Chair/bed transfer assistive device: Armrests, Medical sales representative     Max distance: 50 Assist level: Touching or steadying assistance (Pt > 75%)   Wheelchair   Type: Manual Max wheelchair distance: 118ft  Assist Level: Supervision or verbal cues  Cognition Comprehension Comprehension assist level: Understands complex 90% of the time/cues 10% of the time  Expression Expression assist level: Expresses basic needs/ideas: With no assist  Social Interaction Social Interaction assist level: Interacts appropriately with others - No medications needed.  Problem Solving Problem solving assist level: Solves basic 90% of the time/requires cueing < 10% of the time  Memory Memory assist level: Complete Independence: No helper    Medical Problem List and Plan: 1.Decreased functional mobilitysecondary to lumbar spondylosis with radiculopathy.S/Pdecompression L3-4 and L4-5 with anterior exposure of L5-S1 as well as percutaneous fixation L3 to the sacrum 02/17/2018. Appears to have significant right L3 radiculopathy.Back brace when out of bed  Continue CIR  X-ray ordered 2. 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 Pain Management:  Neurontin 600 mg 4 times daily, increased to 900 3 times a day on 6/10  Baclofen 20 mg 4 times daily, increased to 30 mg 4 times a day on 6/10  Robaxin as needed DC'd on 6/10 due to lack of benefit per patient  OxyContin 10 BID started on 6/4  Percocet changed to q6 PRN  Lidoderm patch added on 6/10  Continue regular ice to right thigh 4. Mood:Wellbutrin XL 300 mg daily, Zoloft 50 mg daily, Xanax 1 mg daily as needed 5. Neuropsych: This patientiscapable of making decisions on herown behalf. 6. Skin/Wound Care:Routine skin checks 7. Fluids/Electrolytes/Nutrition:Routine in and outs  BMP within acceptable range on 6/5 8.Acute blood loss anemia.   Hemoglobin 9.9 on 6/10  Continue to monitor 9.Hypertension. Cozaar 50 mg daily, HCTZ 12.5 mg daily.   Controlled on 6/10 10.Morbid obesity. BMI 36.54.Follow-up dietary 11.Osteoarthritis. 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 Laxative assistance, increased on 6/5  Improving 13. Urinary retention: Resolved  Voiding trial   Urine culture with insignificant growth 14. Assisted fall on right knee  No overt signs of trauma  Continue to monitor  LOS (Days) 6 A FACE TO FACE EVALUATION WAS PERFORMED  Ashley [name] Lorie Phenix 02/27/2018 8:23 AM

## 2018-02-27 NOTE — Progress Notes (Signed)
Occupational Therapy Session Note  Patient Details  Name: Ashley [name] MRN: [id number] Date of Birth: [birth year]  Today's Date: 02/27/2018 OT Individual Time: 3912-2583 and 4621-9471 OT Individual Time Calculation (min): 60 min and 40 min   Short Term Goals: Week 1:  OT Short Term Goal 1 (Week 1): Pt will complete LB bathing with min assist with AE as needed OT Short Term Goal 2 (Week 1): Pt will complete LB dressing with min assist with AE as needed OT Short Term Goal 3 (Week 1): Pt will complete toilet transfers with min assist with LRAD  Skilled Therapeutic Interventions/Progress Updates:    1) Treatment session with focus on ADL retraining with bathing at shower level and use of AE to increase independence with bathing and dressing tasks.  Pt received upright in bed reporting pain in Rt thigh, MD and RN aware.  Pt willing to engage in bathing at shower level to attempt to ease pain.  Completed stand pivot transfers bed > w/c > tub bench with min guard.  Pt completed bathing with supervision, utilizing long handled sponge to wash lower legs and feet and lateral leans to wash buttocks.  Dressing completed with use of reacher to thread underwear and pants and pt ability to stand with use of RW for UE support while pulling pants over hips.  Utilized reacher to don Lt sock and shoe funnel to don bilateral shoes with increased time.  Pt very motivated to be independent, discussed various AE to increase independence with self-care tasks.  2) Treatment session with focus on functional mobility and endurance.  Pt received supine in bed reporting "sore" from activity and pain in Rt thigh.  Pt declined any walking this session but willing to go to gift shop to inquire about AE.  Pt propelled w/c on/off elevators and around obstacles in gift shop with min cues for navigating elevator.  Pt able to locate AE kit and when inquired about pricing, cashier unable to locate price therefore pt to call later  today.  Pt propelled w/c back to room as above.  Completed stand pivot transfer w/c > BSC over toilet and left on toilet - to call when finished.  Therapy Documentation Precautions:  Precautions Precautions: Back, Fall Precaution Comments: back precautions reviewed with pt  Restrictions Weight Bearing Restrictions: No Pain: Pain Assessment Pain Scale: 0-10 Pain Score: 5  Pain Location: Back Pain Orientation: Lower Pain Intervention(s): Medication (See eMAR)  See Function Navigator for Current Functional Status.   Therapy/Group: Individual Therapy  [name] 02/27/2018, 9:55 AM

## 2018-02-27 NOTE — Progress Notes (Addendum)
Physical Therapy Session Note  Patient Details  Name: Ashley [name] MRN: [id number] Date of Birth: [birth year]  Today's Date: 02/27/2018 PT Individual Time: 6789-3810 PT Individual Time Calculation (min): 70 min   Short Term Goals: Week 1:  PT Short Term Goal 1 (Week 1): Pt will perform bed mobility with supervision assist  PT Short Term Goal 2 (Week 1): Pt will ambulate 37ft with supervision assist  PT Short Term Goal 3 (Week 1): Pt will perform WC mobility with supervision assist up to 4ft  PT Short Term Goal 4 (Week 1): Pt will ascend 1 step with LRAD and min assist   Skilled Therapeutic Interventions/Progress Updates:   Pt had LSO already donned, while in bed.   Supine in bed, c/o R groin and back pain: Sherol Dade, RN administered meds at start of session.  Seated EOB bil heel raises x 15.  PT intsructed pt in self stretching L LE for hamstrings and heel cords, using active DF stretch and extended knee, x 30 seconds x 3.  Stand pivot with RW min guard assist.  Mercer Pod arrived and pt and he would like for him to be trained in basic transfers.  Pt tends to move ballistically.  W/c propulsion on level tile, using bil UEs.  VCs for efficiency and turns.   Neuro re-ed RLE via mirror feedback, multimodal cues and visual targets for isolating R hip flexion, R knee extension in L sidelying with powder board between legs, with use of MaxiSlide fabric.  Pt easily frustrated that she does not have full ROM knee extension.  She performed partial range knee extension, full range knee flexion, x 15 x 2, and full range hip flexion, partial range hip extension, x 15 x 2.   Son Norberto Sorenson and sister Beckie Busing trained in basic transfers.  Safety plan updated.  Pt left resting in w/c with family and Sherol Dade, RN in room; Sherol Dade stated that pt did not need quick release belt.    Therapy Documentation Precautions:  Precautions Precautions: Back, Fall Precaution Comments: back precautions reviewed  with pt  Restrictions Weight Bearing Restrictions: No   Pain: 7/10 R groin, low back.  Medicated during session.      See Function Navigator for Current Functional Status.   Therapy/Group: Individual Therapy  [name] 02/27/2018, 12:38 PM

## 2018-02-27 NOTE — Progress Notes (Signed)
Physical Therapy Session Note  Patient Details  Name: Ashley [name] MRN: [id number] Date of Birth: 1975-09-26  Today's Date: 02/27/2018 PT Individual Time: 1400-1430 PT Individual Time Calculation (min): 30 min   Short Term Goals: Week 1:  PT Short Term Goal 1 (Week 1): Pt will perform bed mobility with supervision assist  PT Short Term Goal 2 (Week 1): Pt will ambulate 31ft with supervision assist  PT Short Term Goal 3 (Week 1): Pt will perform WC mobility with supervision assist up to 68ft  PT Short Term Goal 4 (Week 1): Pt will ascend 1 step with LRAD and min assist   Skilled Therapeutic Interventions/Progress Updates:    pt finishing on toilet at PT arrival, able to perform stand pivot transfer from toilet with grab bar and min A.  Min A transfer to [employer]. Pt performs kinetron 90 sec, 45 sec x 2.  Pt requires assist to prevent Rt LE from circumducting during kinetron activity.  Pt performs seated knee flex/ext with pillowcase on floor with initial AAROM for knee ext, progressing to supervision. Pt fatigues easily with all Rt LE activities, requires frequent rests. Pt left in bed with needs at hand.  Therapy Documentation Precautions:  Precautions Precautions: Back, Fall Precaution Comments: back precautions reviewed with pt  Restrictions Weight Bearing Restrictions: No Pain:  no c/o pain   Therapy/Group: Individual Therapy  Rochester Serpe 02/27/2018, 2:32 PM

## 2018-02-28 ENCOUNTER — Inpatient Hospital Stay (HOSPITAL_COMMUNITY): Payer: BLUE CROSS/BLUE SHIELD | Admitting: Occupational Therapy

## 2018-02-28 ENCOUNTER — Inpatient Hospital Stay (HOSPITAL_COMMUNITY): Payer: BLUE CROSS/BLUE SHIELD | Admitting: Physical Therapy

## 2018-02-28 LAB — GLUCOSE, CAPILLARY: Glucose-Capillary: 105 mg/dL — ABNORMAL HIGH (ref 65–99)

## 2018-02-28 NOTE — Progress Notes (Signed)
[facility]IC[facility] RE[facility]ABILITATION     PROGRESS NOTE  Subjective/Complaints:  Patient seen lying in bed this morning. She states she did not sleep well overnight due to noises from nearby construction. She notes benefit with Lidoderm patch. Discussed coping strategies and modalities regarding pain management with patient due to limited medications at discharge.  ROS: + right lower extremity pain, improved. Denies CP, SOB, nausea, vomiting, diarrhea.  Objective: Vital Signs: Blood pressure 126/79, pulse 83, temperature 98.2 F (36.8 C), temperature source Oral, resp. rate 18, height 5\' 11"  (1.803 m), weight 118.8 kg (262 lb), SpO2 98 %. Dg Lumbar Spine Complete  Result Date: 02/27/2018 CLINICAL DATA:  Anterior and posterior fusion. EXAM: LUMBAR SPINE - COMPLETE 4+ VIEW COMPARISON:  CT 11/30/2017 FINDINGS: Changes of anterior fusion at L5-S1 and posterior fusion from L3-S1. No hardware or bony complicating feature. Normal alignment. No fracture. SI joints are symmetric and unremarkable. IUD noted within the pelvis. IMPRESSION: Changes of anterior and posterior fusion as above. No acute bony abnormality or hardware complicating feature. IUD noted within the pelvis. Electronically Signed   By: Rolm Baptise M.D.   On: 02/27/2018 10:13   Recent Labs    02/27/18 0734  WBC 7.9  HGB 9.9*  HCT 32.4*  PLT 323   No results for input(s): NA, K, CL, GLUCOSE, BUN, CREATININE, CALCIUM in the last 72 hours.  Invalid input(s): CO CBG (last 3)  No results for input(s): GLUCAP in the last 72 hours.  Wt Readings from Last 3 Encounters:  02/21/18 118.8 kg (262 lb)  02/21/18 118.8 kg (262 lb)  02/08/18 119.2 kg (262 lb 12.8 oz)    Physical Exam:  BP 126/79   Pulse 83   Temp 98.2 F (36.8 C) (Oral)   Resp 18   Ht 5\' 11"  (1.803 m)   Wt 118.8 kg (262 lb)   SpO2 98%   BMI 36.54 kg/m  Constitutional: No distress . Vital signs reviewed. HENT: Normocephalic.  Atraumatic. Eyes: EOMI. No  discharge. Cardiovascular: RRR. No JVD. Respiratory: CTA Bilaterally. Normal effort. GI: BS +. Non-distended. Musc: proximal right lower extremity tenderness Neurological: She isalert.  Motor: B/l UE, LLE 5/5 proximal to distal.  RLE: HF 2/5, KE 1+/5, ADF 5/5 (stable) Skin:warm and dry.  Incisions remain clean dry and intact Psychiatric: Flat affect..  Assessment/Plan: 1. Functional deficits secondary to lumbar spondylosis with radiculopathy.S/Pdecompression which require 3+ hours per day of interdisciplinary therapy in a comprehensive inpatient rehab setting. Physiatrist is providing close team supervision and 24 hour management of active medical problems listed below. Physiatrist and rehab team continue to assess barriers to discharge/monitor patient progress toward functional and medical goals.  Function:  Bathing Bathing position   Position: Shower  Bathing parts Body parts bathed by patient: Right arm, Left arm, Chest, Abdomen, Front perineal area, Right upper leg, Left upper leg, Buttocks, Right lower leg, Left lower leg Body parts bathed by helper: Back  Bathing assist Assist Level: Assistive device, Set up, Supervision or verbal cues Assistive Device Comment: long handled sponge    Upper Body Dressing/Undressing Upper body dressing   What is the patient wearing?: Pull over shirt/dress, Bra Bra - Perfomed by patient: Thread/unthread right bra strap, Thread/unthread left bra strap, Hook/unhook bra (pull down sports bra)   Pull over shirt/dress - Perfomed by patient: Thread/unthread right sleeve, Thread/unthread left sleeve, Put head through opening, Pull shirt over trunk          Upper body assist Assist  Level: Set up   Set up : To obtain clothing/put away  Lower Body Dressing/Undressing Lower body dressing   What is the patient wearing?: Underwear, Pants, Socks, Shoes Underwear - Performed by patient: Thread/unthread right underwear leg, Thread/unthread left  underwear leg, Pull underwear up/down Underwear - Performed by helper: Pull underwear up/down Pants- Performed by patient: Thread/unthread right pants leg, Thread/unthread left pants leg, Pull pants up/down Pants- Performed by helper: Pull pants up/down   Non-skid slipper socks- Performed by helper: Don/doff right sock, Don/doff left sock Socks - Performed by patient: Don/doff left sock Socks - Performed by helper: Don/doff right sock Shoes - Performed by patient: Don/doff right shoe, Don/doff left shoe Shoes - Performed by helper: Fasten right          Lower body assist Assist for lower body dressing: Assistive device, Touching or steadying assistance (Pt > 75%) Assistive Device Comment: reacher, shoe funnel    Toileting Toileting   Toileting steps completed by patient: Adjust clothing prior to toileting, Performs perineal hygiene, Adjust clothing after toileting Toileting steps completed by helper: Adjust clothing prior to toileting, Performs perineal hygiene, Adjust clothing after toileting(per Starlyn Skeans, NT report) Toileting Assistive Devices: Grab bar or rail  Toileting assist Assist level: Touching or steadying assistance (Pt.75%), More than reasonable time   Transfers Chair/bed transfer   Chair/bed transfer method: Stand pivot Chair/bed transfer assist level: Touching or steadying assistance (Pt > 75%) Chair/bed transfer assistive device: Armrests, Medical sales representative     Max distance: 50 Assist level: Touching or steadying assistance (Pt > 75%)   Wheelchair   Type: Manual Max wheelchair distance: 146ft  Assist Level: Supervision or verbal cues  Cognition Comprehension Comprehension assist level: Understands basic 90% of the time/cues < 10% of the time  Expression Expression assist level: Expresses complex ideas: With no assist  Social Interaction Social Interaction assist level: Interacts appropriately with others - No medications needed.  Problem  Solving Problem solving assist level: Solves basic 75 - 89% of the time/requires cueing 10 - 24% of the time  Memory Memory assist level: Complete Independence: No helper    Medical Problem List and Plan: 1.Decreased functional mobilitysecondary to lumbar spondylosis with radiculopathy.S/Pdecompression L3-4 and L4-5 with anterior exposure of L5-S1 as well as percutaneous fixation L3 to the sacrum 02/17/2018. Appears to have significant right L3 radiculopathy.Back brace when out of bed  Continue CIR  X-ray reviewed, postoperative changes 2. DVT Prophylaxis/Anticoagulation: SCDs d/ced  Lovenox started on 6/6   Vascular study showing chronic, but not acute DVT 3. Pain Management:  Neurontin 600 mg 4 times daily, increased to 900 3 times a day on 6/10  Baclofen 20 mg 4 times daily, increased to 30 mg 4 times a day on 6/10  Robaxin as needed DC'd on 6/10 due to lack of benefit per patient  OxyContin 10 BID started on 6/4  Percocet changed to q6 PRN  Lidoderm patch added on 6/10  Continue regular ice to right thigh  Slightly improved on 6/11 4. Mood:Wellbutrin XL 300 mg daily, Zoloft 50 mg daily, Xanax 1 mg daily as needed 5. Neuropsych: This patientiscapable of making decisions on herown behalf. 6. Skin/Wound Care:Routine skin checks 7. Fluids/Electrolytes/Nutrition:Routine in and outs  BMP within acceptable range on 6/5   Labs ordered for tomorrow 8.Acute blood loss anemia.   Hemoglobin 9.9 on 6/10  Continue to monitor 9.Hypertension. Cozaar 50 mg daily, HCTZ 12.5 mg daily.   Relatively controlled with one  elevation on 6/11 10.Morbid obesity. BMI 36.54.Follow-up dietary 11.Osteoarthritis. 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 Laxative assistance, increased on 6/5 13. Urinary retention: Resolved  Voiding trial   Urine culture with insignificant growth 14. Assisted fall on right knee  No overt signs of  trauma  Continue to monitor  LOS (Days) 7 A FACE TO FACE EVALUATION WAS PERFORMED  Mirriam Vadala Lorie Phenix 02/28/2018 7:53 AM

## 2018-02-28 NOTE — Progress Notes (Signed)
Physical Therapy Weekly Progress Note  Patient Details  Name: RAIVYN KABLER MRN: [id number] Date of Birth: 12-03-1975  Beginning of progress report period: February 21, 2018 End of progress report period: February 28, 2018  Today's Date: 03/01/2018  Patient has met 3 of 4 short term goals.  Pt has made noted progress during course of therapy, she continues to make progression with gait and has initiated stair training for entry to home. Pt continues to be limited due to RLE weakness particularly in hip flexors and quads.   Patient continues to demonstrate the following deficits muscle weakness and impaired timing and sequencing and unbalanced muscle activation and therefore will continue to benefit from skilled PT intervention to increase functional independence with mobility.  Patient progressing toward long term goals..  Continue plan of care.  PT Short Term Goals Week 1:  PT Short Term Goal 1 (Week 1): Pt will perform bed mobility with supervision assist  PT Short Term Goal 1 - Progress (Week 1): Met PT Short Term Goal 2 (Week 1): Pt will ambulate 27f with supervision assist  PT Short Term Goal 2 - Progress (Week 1): Met PT Short Term Goal 3 (Week 1): Pt will perform WC mobility with supervision assist up to 160f PT Short Term Goal 3 - Progress (Week 1): Met PT Short Term Goal 4 (Week 1): Pt will ascend 1 step with LRAD and min assist  PT Short Term Goal 4 - Progress (Week 1): Partly met Week 2:    STG=LTG due to ELOS     Therapy Documentation Precautions:  Precautions Precautions: Back, Fall Precaution Comments: back precautions reviewed with pt  Restrictions Weight Bearing Restrictions: No General:   Vital Signs: Therapy Vitals Temp: 97.7 F (36.5 C) Temp Source: Oral Pulse Rate: 99 Resp: 18 BP: 112/66 Patient Position (if appropriate): Sitting Oxygen Therapy SpO2: 96 % O2 Device: Room Air   See Function Navigator for Current Functional Status.  Therapy/Group:  Individual Therapy  Rosita DeChalus 03/01/2018, 3:48 PM

## 2018-02-28 NOTE — Progress Notes (Signed)
Physical Therapy Session Note  Patient Details  Name: Ashley [name] MRN: [id number] Date of Birth: 1976-06-07  Today's Date: 02/28/2018 PT Individual Time: 1100-1200 PT Individual Time Calculation (min): 60 min   Short Term Goals: Week 1:  PT Short Term Goal 1 (Week 1): Pt will perform bed mobility with supervision assist  PT Short Term Goal 2 (Week 1): Pt will ambulate 76ft with supervision assist  PT Short Term Goal 3 (Week 1): Pt will perform WC mobility with supervision assist up to 70ft  PT Short Term Goal 4 (Week 1): Pt will ascend 1 step with LRAD and min assist    Skilled Therapeutic Interventions/Progress Updates: Pt presented on mat in gym hand off from OT and agreeable to therapy. Session focused on functional mobility. Pt performed stair training with 3in step x 2 with bilateral rails. Pt able to initiate RLE hip flexion to allow clearance of 3in step. Ambulated to parallel bars with blocked practice of step up/step down on 4in step 2 x 6. Pt initially unable to clear RLE down onto group without assist which improved with repetition. Pt noted to  Slightly circumduct RLE to facilitate clearance as becoming fatigued. After therapeutic rest pt ambulated 141ft with x1 occurrence of knee buckling which pt was able to correct. Pt transferred to Cybex Kinetron and performed x 20 cycles at 80cm/sec with emphasis on maintaining R knee neutral with improved return. Pt transported back to room and remained in w/c at end of session with call bell within reach and sister present.      Therapy Documentation Precautions:  Precautions Precautions: Back, Fall Precaution Comments: back precautions reviewed with pt  Restrictions Weight Bearing Restrictions: No  See Function Navigator for Current Functional Status.   Therapy/Group: Individual Therapy  Loryn Haacke  Ismar Yabut, PTA  02/28/2018, 12:13 PM

## 2018-02-28 NOTE — Progress Notes (Signed)
Occupational Therapy Session Note  Patient Details  Name: Ashley [name] MRN: [id number] Date of Birth: 28-Feb-1976  Today's Date: 03/06/2018 OT Individual Time: 1040-1105  OT treatment time:  25 min      Short Term Goals: Week 1:  OT Short Term Goal 1 (Week 1): Pt will complete LB bathing with min assist with AE as needed OT Short Term Goal 2 (Week 1): Pt will complete LB dressing with min assist with AE as needed OT Short Term Goal 3 (Week 1): Pt will complete toilet transfers with min assist with LRAD  Skilled Therapeutic Interventions/Progress Updates:    Pt seen this session to focus on R leg AROM. Pt received in w/c and propelled self to gym. Hip adduction strength with ball squeezes. Transferred to mat using RW with close S.  Pt worked on small squats with B hand support on RW 15x and then sat to mat. From EOM,  Pt moved into supine.  A/arom of R hip and knee flex/ ext with sliding foot in and out on mat.  Recommended these exercises at home with A from family. Pt could have R foot placed on thick paper plate to allow for ease in sliding.  Pt then sat to EOM for preparation for PT session.  PT arrived for next session.   Therapy Documentation Precautions:  Precautions Precautions: Back, Fall Precaution Comments: back precautions reviewed with pt  Restrictions Weight Bearing Restrictions: No    Pain: 3/10 R hip, pre medicated    ADL:     See Function Navigator for Current Functional Status.   Therapy/Group: Individual Therapy  [place] 02/28/2018, 12:17 PM

## 2018-02-28 NOTE — Progress Notes (Signed)
Occupational Therapy Session Note  Patient Details  Name: Ashley [name] MRN: [id number] Date of Birth: 1976/05/17  Today's Date: 02/28/2018 OT Individual Time: 1856-3149 and 1500-1600 OT Individual Time Calculation (min): 60 min and 60 min   Short Term Goals: Week 1:  OT Short Term Goal 1 (Week 1): Pt will complete LB bathing with min assist with AE as needed OT Short Term Goal 2 (Week 1): Pt will complete LB dressing with min assist with AE as needed OT Short Term Goal 3 (Week 1): Pt will complete toilet transfers with min assist with LRAD  Skilled Therapeutic Interventions/Progress Updates:    1) Treatment session with focus on ADL retraining and BLE strengthening.  Pt received supine in bed reporting not sleeping well overnight but wanting to engage in bathing at shower. Discussed goal of ambulating to shower with RW with pt stating not today but that she would attempt tomorrow.  Completed stand pivot transfer bed > w/c> shower chair with supervision.  Pt completed bathing at overall supervision/setup level with use of long handled sponge and washing buttocks with lateral leans.  Dressing completed with use of reacher to thread underwear and shorts as well as shoe funnel when donning shoes.  Engaged in [place] with [place] "C" exercises of heel lifts and toe lifts while standing at parallel bars to provide UE support and mirror for visual feedback.  Pt with increased difficulty with toe lifts Rt > Lt.  Attempted toe tapping to target infront of RLE, pt with difficulty lifting leg - utilizing circumduction and rotating torso to successfully tap target.  Pt returned to room propelling w/c for BUE strengthening and endurance.  2) Treatment session with focus on BLE strengthening and motor control of RLE as well as w/c propulsion in community environment.  Pt propelled w/c on/off elevator and through gift shop to purchase AE kit all at supervision level.  Returned to room where therapist  applied elastic shoelaces to shoes and pt doffed with use of new 32" reacher.  Engaged in discussion regarding pt concerns with d/c home with pt expressing concerns regarding being w/c level vs ambulatory and accessing items in kitchen as well as laundry.  Engaged in [place] "C" exercises of back knee bends and side hip with focus on motor control with RLE and strengthening of single leg stance on RLE when completing exercises on LLE.  Therapy Documentation Precautions:  Precautions Precautions: Back, Fall Precaution Comments: back precautions reviewed with pt  Restrictions Weight Bearing Restrictions: No Pain: Pain Assessment Pain Scale: 0-10 Pain Score: 6  Pain Type: Surgical pain Pain Location: Back Pain Orientation: Lower Pain Intervention(s): Medication (See eMAR)  See Function Navigator for Current Functional Status.   Therapy/Group: Individual Therapy  [name] 02/28/2018, 10:48 AM

## 2018-03-01 ENCOUNTER — Inpatient Hospital Stay (HOSPITAL_COMMUNITY): Payer: BLUE CROSS/BLUE SHIELD | Admitting: Occupational Therapy

## 2018-03-01 ENCOUNTER — Inpatient Hospital Stay (HOSPITAL_COMMUNITY): Payer: BLUE CROSS/BLUE SHIELD | Admitting: Physical Therapy

## 2018-03-01 DIAGNOSIS — R52 Pain, unspecified: Secondary | ICD-10-CM

## 2018-03-01 LAB — BASIC METABOLIC PANEL
Anion gap: 8 (ref 5–15)
BUN: 12 mg/dL (ref 6–20)
CO2: 30 mmol/L (ref 22–32)
Calcium: 8.9 mg/dL (ref 8.9–10.3)
Chloride: 101 mmol/L (ref 101–111)
Creatinine, Ser: 0.87 mg/dL (ref 0.44–1.00)
GFR calc Af Amer: >60 mL/min (ref >60)
GFR calc non Af Amer: >60 mL/min (ref >60)
Glucose, Bld: 81 mg/dL (ref 65–99)
Potassium: 3.9 mmol/L (ref 3.5–5.1)
Sodium: 139 mmol/L (ref 135–145)

## 2018-03-01 NOTE — Plan of Care (Signed)
  Problem: SCI BOWEL ELIMINATION Goal: RH STG MANAGE BOWEL WITH ASSISTANCE Description STG Manage Bowel with Min Assistance.  Outcome: Progressing Goal: RH STG SCI MANAGE BOWEL WITH MEDICATION WITH ASSISTANCE Description STG SCI Manage bowel with medication with Min assistance.  Outcome: Progressing Goal: RH STG SCI MANAGE BOWEL PROGRAM W/ASSIST OR AS APPROPRIATE Description STG SCI Manage bowel program w/ Min assist or as appropriate.  Outcome: Progressing Goal: RH OTHER STG BOWEL ELIMINATION GOALS W/ASSIST Description Other STG Bowel Elimination Goals With [place].  Outcome: Progressing   Problem: SCI BLADDER ELIMINATION Goal: RH STG MANAGE BLADDER WITH ASSISTANCE Description STG Manage Bladder With Min Assistance  Outcome: Progressing   Problem: RH SKIN INTEGRITY Goal: RH STG SKIN FREE OF INFECTION/BREAKDOWN Outcome: Progressing Goal: RH STG MAINTAIN SKIN INTEGRITY WITH ASSISTANCE Description STG Maintain Skin Integrity With [place].  Outcome: Progressing Goal: RH STG ABLE TO PERFORM INCISION/WOUND CARE W/ASSISTANCE Description STG Able To Perform Incision/Wound Care With [employer].  Outcome: Progressing Goal: RH OTHER STG SKIN INTEGRITY GOALS W/ASSIST Description Other STG Skin Integrity Goals With Assistance. Outcome: Progressing   Problem: RH SAFETY Goal: RH STG ADHERE TO SAFETY PRECAUTIONS W/ASSISTANCE/DEVICE Description STG Adhere to Safety Precautions With min  Assistance/Device.  Outcome: Progressing Goal: RH STG DECREASED RISK OF FALL WITH ASSISTANCE Description STG Decreased Risk of Fall With [employer].  Outcome: Progressing Goal: RH STG DEMO UNDERSTANDING HOME SAFETY PRECAUTIONS Outcome: Progressing Goal: RH OTHER STG SAFETY GOALS W/ASSIST Description Other STG Safety Goals With Assistance. Outcome: Progressing   Problem: RH PAIN MANAGEMENT Goal: RH STG PAIN MANAGED AT OR BELOW PT'S PAIN GOAL Description Patient will be  pain free or less than 3  Outcome: Progressing   Problem: RH KNOWLEDGE DEFICIT SCI Goal: RH STG INCREASE KNOWLEDGE OF SELF CARE AFTER SCI Outcome: Progressing

## 2018-03-01 NOTE — Progress Notes (Signed)
Occupational Therapy Session Note  Patient Details  Name: Ashley [name] MRN: [id number] Date of Birth: [birth year]  Today's Date: 03/01/2018 OT Individual Time: 8768-1157 OT Individual Time Calculation (min): 54 min    Short Term Goals: Week 1:  OT Short Term Goal 1 (Week 1): Pt will complete LB bathing with min assist with AE as needed OT Short Term Goal 2 (Week 1): Pt will complete LB dressing with min assist with AE as needed OT Short Term Goal 3 (Week 1): Pt will complete toilet transfers with min assist with LRAD  Skilled Therapeutic Interventions/Progress Updates:    Pt seen for OT session focusing on functional mobility and activity tolerance during simple meal prep activity. Pt in supine upon arrival with hand off from PT. Pt voicing fatigue from previous sessions but willing to participate as able. Voiced chronic back pain but denied need for medication. She transferred throughout session via stand pivot with close supervision.  In ADL apartment, pt completed simple meal prep activity, making oat meal with use of microwave. PT able to obtain all needed items and transport items with supervision, VCs for problem solving and occasional RW management. Pt tolerating ~20 minutes of functional mobility at standing level before requiring seated rest break.  Pt able to complete sit<> stand from standard chair without armrests as well as from low soft surface couch with guarding assist and assist to stabilize equipment. Pt returned to room at end of session, requesting to stay sitting up in w/c. Left with all needs in reach and NT present  Therapy Documentation Precautions:  Precautions Precautions: Back, Fall Precaution Comments: back precautions reviewed with pt  Restrictions Weight Bearing Restrictions: No  See Function Navigator for Current Functional Status.   Therapy/Group: Individual Therapy  Megyn Leng L 03/01/2018, 7:16 AM

## 2018-03-01 NOTE — Progress Notes (Signed)
Physical Therapy Session Note  Patient Details  Name: Ashley [name] MRN: [id number] Date of Birth: 1976/05/28  Today's Date: 03/01/2018 PT Individual Time: 1000-1030 PT Individual Time Calculation (min): 30 min   Short Term Goals: Week 1:  PT Short Term Goal 1 (Week 1): Pt will perform bed mobility with supervision assist  PT Short Term Goal 1 - Progress (Week 1): Met PT Short Term Goal 2 (Week 1): Pt will ambulate 23f with supervision assist  PT Short Term Goal 2 - Progress (Week 1): Met PT Short Term Goal 3 (Week 1): Pt will perform WC mobility with supervision assist up to 142f PT Short Term Goal 3 - Progress (Week 1): Met PT Short Term Goal 4 (Week 1): Pt will ascend 1 step with LRAD and min assist  PT Short Term Goal 4 - Progress (Week 1): Partly met Week 2:    STG=LTG due to ELOS   Skilled Therapeutic Interventions/Progress Updates:   Pt received sitting in WC and agreeable to PT. PT instructed pt in WC mobility x 20027fith supervision assist from Pt with min cues for doorway management.   Gait training with RW x 150f64fth min assist and min cues for improved heel contact on the RLE and improved foot clearance.  Pre-stair training to perform reciprocal foot taps on 3 inch step with min-mod assist to advance the RLE and moderate cues for decreased compensatory movement through trunk.  Patient returned to room and [facility] and all needs met.       Therapy Documentation Precautions:  Precautions Precautions: Back, Fall Precaution Comments: back precautions reviewed with pt  Restrictions Weight Bearing Restrictions: No Vital Signs: Therapy Vitals Temp: 97.7 F (36.5 C) Temp Source: Oral Pulse Rate: 99 Resp: 18 BP: 112/66 Patient Position (if appropriate): Sitting Oxygen Therapy SpO2: 96 % O2 Device: Room Air   See Function Navigator for Current Functional Status.   Therapy/Group: Individual Therapy  AustLorie Phenix2/2019, 4:56 PM

## 2018-03-01 NOTE — Progress Notes (Signed)
Occupational Therapy Weekly Progress Note  Patient Details  Name: Ashley [name] MRN: [id number] Date of Birth: December 25, 1975  Beginning of progress report period: February 22, 2018 End of progress report period: March 01, 2018  Today's Date: 03/01/2018 OT Individual Time: 1443-1540 OT Individual Time Calculation (min): 55 min    Patient has met 3 of 3 short term goals.  Pt is making steady progress towards goals.  Pt can complete stand pivot transfers w/c <> toilet and w/c <> tub bench in room shower and tub/shower with supervision.  Pt is hesitant to complete transfers with RW due to fall in bathroom.  Pt has demonstrated ability to use AE to increase independence with LB bathing and dressing tasks.  Pt's son has been present to observe therapy sessions and has been checked off to provide supervision/assist with basic transfers.  Pt will benefit from continued OT to increase independence with RW with transfers and functional mobility and continue to build up strength, endurance, and confidence.  Patient continues to demonstrate the following deficits: muscle weakness and decreased sitting balance, decreased standing balance, decreased balance strategies and pain and therefore will continue to benefit from skilled OT intervention to enhance overall performance with BADL and Reduce care partner burden.  Patient progressing toward long term goals..  Continue plan of care.  OT Short Term Goals Week 1:  OT Short Term Goal 1 (Week 1): Pt will complete LB bathing with min assist with AE as needed OT Short Term Goal 1 - Progress (Week 1): Met OT Short Term Goal 2 (Week 1): Pt will complete LB dressing with min assist with AE as needed OT Short Term Goal 2 - Progress (Week 1): Met OT Short Term Goal 3 (Week 1): Pt will complete toilet transfers with min assist with LRAD OT Short Term Goal 3 - Progress (Week 1): Met Week 2:  OT Short Term Goal 1 (Week 2): STG = LTGs due to remaining LOS  Skilled  Therapeutic Interventions/Progress Updates:    Treatment session with focus on ADL retraining with functional transfers and dynamic standing balance.  Pt received upright in w/c ready for session.  Pt reports still anxious about ambulating in to bathroom but willing to attempt during this session.  Ambulated with RW and min assist into bathroom and completed transfer onto tub bench with min cues for technique and min assist.  Bathing completed seated with use of long handled sponge.  Engaged in discussion regarding setup of home bathroom and recommendation to complete dressing from toilet as EOB at home is too high.  Dressing completed with setup assist for all items and cues for use of shoe horn when donning shoes.  Pt reports preference for shoe funnel over shoe horn - plans to purchase online.  Engaged in mobility in kitchen with focus on problem solving access to most used items as well as sit > stand at counter to obtain items from overhead cabinets.  Discussed placement of chair for energy conservation during meal prep or clean up.  Returned to room and left upright in w/c with all needs in reach.  Therapy Documentation Precautions:  Precautions Precautions: Back, Fall Precaution Comments: back precautions reviewed with pt  Restrictions Weight Bearing Restrictions: No General:   Vital Signs: Therapy Vitals Temp: 98.8 F (37.1 C) Temp Source: Oral Pulse Rate: 89 Resp: 20 BP: 131/88 Patient Position (if appropriate): Sitting Oxygen Therapy SpO2: 98 % O2 Device: Room Air Pain:   Pt reports pain 5/10 in Rt  thigh.  RN notified.  See Function Navigator for Current Functional Status.   Therapy/Group: Individual Therapy  [name] 03/01/2018, 7:21 AM

## 2018-03-01 NOTE — Progress Notes (Signed)
ANTICOAGULATION CONSULT NOTE - Follow up [facility] for DVT Prophylaxis Indication: DVT Prophylaxis  Allergies  Allergen Reactions  . Gadolinium Derivatives Nausea And Vomiting    Pt describes this happens every time she gets gado even with slow injection  . Codeine Itching and Nausea Only  . Cymbalta [Duloxetine Hcl] Other (See Comments)    Altered mental status    Patient Measurements: Height: 5\' 11"  (180.3 cm) Weight: 262 lb (118.8 kg) IBW/kg (Calculated) : 70.8 Heparin Dosing Weight:   Vital Signs: Temp: 98.8 F (37.1 C) (06/12 0551) Temp Source: Oral (06/12 0551) BP: 131/88 (06/12 0551) Pulse Rate: 89 (06/12 0551)  Labs: Recent Labs    02/27/18 0734 03/01/18 0731  HGB 9.9*  --   HCT 32.4*  --   PLT 323  --   CREATININE  --  0.87    Estimated Creatinine Clearance: 119.7 mL/min (by C-G formula based on SCr of 0.87 mg/dL).   Medical History: Past Medical History:  Diagnosis Date  . Arthritis    knees, back  . 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 Complication of anesthesia   . Depression   . History of pneumonia   . Hypertension   . Lumbar spondylosis   . PCOS (polycystic ovarian syndrome)   . Pneumonia    2016  . PONV (postoperative nausea and vomiting)     Assessment: Pt with decreased mobility secondary to lumbar spondylosis. S/Pdecompression L3-4 and L4-5 with anterior exposure of L5-S1 as well as percutaneous fixation L3 to the sacrum 02/17/2018. SCDs dc'd. Started lovenox for VTE prophylaxis.  Scr and CBC remain stable. No bleeding reported.    Plan:  Continue Lovenox 60 mg (0.5 mg/kg) subQ q 24 hr based on BMI > 30. Pharmacy will sign off and follow peripherally.  Thank you for allowing [place] to participate in this patients care.   Jens Som, PharmD Clinical phone for 03/01/2018 from 7a-3:30p: x [phone] Please utilize amion to contact appropriate pharmacist

## 2018-03-01 NOTE — Progress Notes (Signed)
[facility] PHYSICAL MEDICINE & REHABILITATION     PROGRESS NOTE  Subjective/Complaints:  Patient seen lying in bed this morning. She states she slept well overnight. She has questions about the discharge date.  ROS: denies CP, SOB, nausea, vomiting, diarrhea.  Objective: Vital Signs: Blood pressure 131/88, pulse 89, temperature 98.8 F (37.1 C), temperature source Oral, resp. rate 20, height 5\' 11"  (1.803 m), weight 118.8 kg (262 lb), SpO2 98 %. Dg Lumbar Spine Complete  Result Date: 02/27/2018 CLINICAL DATA:  Anterior and posterior fusion. EXAM: LUMBAR SPINE - COMPLETE 4+ VIEW COMPARISON:  CT 11/30/2017 FINDINGS: Changes of anterior fusion at L5-S1 and posterior fusion from L3-S1. No hardware or bony complicating feature. Normal alignment. No fracture. SI joints are symmetric and unremarkable. IUD noted within the pelvis. IMPRESSION: Changes of anterior and posterior fusion as above. No acute bony abnormality or hardware complicating feature. IUD noted within the pelvis. Electronically Signed   By: Rolm Baptise M.D.   On: 02/27/2018 10:13   Recent Labs    02/27/18 0734  WBC 7.9  HGB 9.9*  HCT 32.4*  PLT 323   Recent Labs    03/01/18 0731  NA 139  K 3.9  CL 101  GLUCOSE 81  BUN 12  CREATININE 0.87  CALCIUM 8.9   CBG (last 3)  Recent Labs    02/28/18 2148  GLUCAP 105*    Wt Readings from Last 3 Encounters:  02/21/18 118.8 kg (262 lb)  02/21/18 118.8 kg (262 lb)  02/08/18 119.2 kg (262 lb 12.8 oz)    Physical Exam:  BP 131/88 (BP Location: Left Arm)   Pulse 89   Temp 98.8 F (37.1 C) (Oral)   Resp 20   Ht 5\' 11"  (1.803 m)   Wt 118.8 kg (262 lb)   SpO2 98%   BMI 36.54 kg/m  Constitutional: No distress . Vital signs reviewed. HENT: Normocephalic.  Atraumatic. Eyes: EOMI. No discharge. Cardiovascular: RRR. No JVD. Respiratory: CTA Bilaterally. Normal effort. GI: BS +. Non-distended. Musc: proximal right lower extremity tenderness Neurological: She  isalert.  Motor: B/l UE, LLE 5/5 proximal to distal.  RLE: HF 2/5, KE 1+/5, ADF 5/5 (unchanged) Skin:warm and dry.  Incisions remain clean dry and intact Psychiatric: Flat affect..  Assessment/Plan: 1. Functional deficits secondary to lumbar spondylosis with radiculopathy.S/Pdecompression which require 3+ hours per day of interdisciplinary therapy in a comprehensive inpatient rehab setting. Physiatrist is providing close team supervision and 24 hour management of active medical problems listed below. Physiatrist and rehab team continue to assess barriers to discharge/monitor patient progress toward functional and medical goals.  Function:  Bathing Bathing position   Position: Shower  Bathing parts Body parts bathed by patient: Right arm, Left arm, Chest, Abdomen, Front perineal area, Right upper leg, Left upper leg, Buttocks, Right lower leg, Left lower leg Body parts bathed by helper: Back  Bathing assist Assist Level: Assistive device, Set up, Supervision or verbal cues Assistive Device Comment: long handled sponge    Upper Body Dressing/Undressing Upper body dressing   What is the patient wearing?: Pull over shirt/dress, Bra Bra - Perfomed by patient: Thread/unthread right bra strap, Thread/unthread left bra strap, Hook/unhook bra (pull down sports bra)   Pull over shirt/dress - Perfomed by patient: Thread/unthread right sleeve, Thread/unthread left sleeve, Put head through opening, Pull shirt over trunk          Upper body assist Assist Level: Set up   Set up : To obtain clothing/put away  Lower Body Dressing/Undressing Lower body dressing   What is the patient wearing?: Underwear, Pants, Shoes Underwear - Performed by patient: Thread/unthread right underwear leg, Thread/unthread left underwear leg, Pull underwear up/down Underwear - Performed by helper: Pull underwear up/down Pants- Performed by patient: Thread/unthread right pants leg, Thread/unthread left pants leg,  Pull pants up/down Pants- Performed by helper: Pull pants up/down   Non-skid slipper socks- Performed by helper: Don/doff right sock, Don/doff left sock Socks - Performed by patient: Don/doff left sock Socks - Performed by helper: Don/doff right sock Shoes - Performed by patient: Don/doff right shoe, Don/doff left shoe Shoes - Performed by helper: Fasten right          Lower body assist Assist for lower body dressing: Assistive device, Touching or steadying assistance (Pt > 75%) Assistive Device Comment: reacher, shoe funnel    Toileting Toileting   Toileting steps completed by patient: Adjust clothing prior to toileting, Performs perineal hygiene, Adjust clothing after toileting Toileting steps completed by helper: Adjust clothing prior to toileting, Performs perineal hygiene, Adjust clothing after toileting(per Starlyn Skeans, NT report) Toileting Assistive Devices: Grab bar or rail  Toileting assist Assist level: Touching or steadying assistance (Pt.75%), More than reasonable time   Transfers Chair/bed transfer   Chair/bed transfer method: Stand pivot Chair/bed transfer assist level: Touching or steadying assistance (Pt > 75%) Chair/bed transfer assistive device: Armrests, Medical sales representative     Max distance: 161ft Assist level: Touching or steadying assistance (Pt > 75%)   Wheelchair   Type: Manual Max wheelchair distance: 147ft  Assist Level: Supervision or verbal cues  Cognition Comprehension Comprehension assist level: Understands basic 90% of the time/cues < 10% of the time  Expression Expression assist level: Expresses complex ideas: With no assist  Social Interaction Social Interaction assist level: Interacts appropriately with others - No medications needed.  Problem Solving Problem solving assist level: Solves basic 75 - 89% of the time/requires cueing 10 - 24% of the time  Memory Memory assist level: Complete Independence: No helper    Medical  Problem List and Plan: 1.Decreased functional mobilitysecondary to lumbar spondylosis with radiculopathy.S/Pdecompression L3-4 and L4-5 with anterior exposure of L5-S1 as well as percutaneous fixation L3 to the sacrum 02/17/2018. Appears to have significant right L3 radiculopathy.Back brace when out of bed  Continue CIR  X-ray reviewed, postoperative changes 2. DVT Prophylaxis/Anticoagulation: SCDs d/ced  Lovenox started on 6/6   Vascular study showing chronic, but not acute DVT 3. Pain Management:  Neurontin 600 mg 4 times daily, increased to 900 3 times a day on 6/10  Baclofen 20 mg 4 times daily, increased to 30 mg 4 times a day on 6/10  Robaxin as needed DC'd on 6/10 due to lack of benefit per patient  OxyContin 10 BID started on 6/4, DC'd on 6/12 in anticipation of discharge  Percocet changed to q6 PRN  Lidoderm patch added on 6/10  Continue regular ice to right thigh  Slightly improved on 6/11 4. Mood:Wellbutrin XL 300 mg daily, Zoloft 50 mg daily, Xanax 1 mg daily as needed 5. Neuropsych: This patientiscapable of making decisions on herown behalf. 6. Skin/Wound Care:Routine skin checks 7. Fluids/Electrolytes/Nutrition:Routine in and outs  BMP ithin normal range on 6/12 8.Acute blood loss anemia.   Hemoglobin 9.9 on 6/10  Continue to monitor 9.Hypertension. Cozaar 50 mg daily, HCTZ 12.5 mg daily.   Controlled on 6/12 10.Morbid obesity. BMI 36.54.Follow-up dietary 11.Osteoarthritis. 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 Laxative assistance, increased on 6/5  Improving 13. Urinary retention: Resolved  Voiding trial   Urine culture with insignificant growth 14. Assisted fall on right knee  No overt signs of trauma  Continue to monitor  LOS (Days) 8 A FACE TO FACE EVALUATION WAS PERFORMED  Milagro Belmares Lorie Phenix 03/01/2018 9:17 AM

## 2018-03-01 NOTE — Progress Notes (Signed)
Physical Therapy Session Note  Patient Details  Name: Ashley [name] MRN: [id number] Date of Birth: 1976/08/20  Today's Date: 03/01/2018 PT Individual Time: 1300-1403 PT Individual Time Calculation (min): 63 min   Short Term Goals: Week 2:     Skilled Therapeutic Interventions/Progress Updates: Pt presented in w/c agreeable to therapy with sister present. Pt propelled to rehab gym supervision and performed stand pivot to mat. Session focused on use of NMES for facilitation of quadriceps and hip flexors. PTA placed 2 small pads on VMO and rectus femoris. Settings on 15sec on 15 sec off. PTA assisted in SAQ for 15 min with noted trace activation of ms during activity. Pt returned to EOM supine to sit via log roll technique with supervision and performed stand pivot transfer return to w/c. Pt returned to room for attempted higher placement of pads on rectus femoris. Performed stand pivot with min guard and sit to supine min guard with HOB elevated. NMES performed with same settings as prior with trace ms activation. Pt performed quad set during second trial of NMES. Pt then handed off to OT while pt remained in bed for next session.      Therapy Documentation Precautions:  Precautions Precautions: Back, Fall Precaution Comments: back precautions reviewed with pt  Restrictions Weight Bearing Restrictions: No General:   Vital Signs: Therapy Vitals Temp: 97.7 F (36.5 C) Temp Source: Oral Pulse Rate: 99 Resp: 18 BP: 112/66 Patient Position (if appropriate): Sitting Oxygen Therapy SpO2: 96 % O2 Device: Room Air   See Function Navigator for Current Functional Status.   Therapy/Group: Individual Therapy  Ashley [name]  Betty Daidone, PTA  03/01/2018, 2:56 PM

## 2018-03-02 ENCOUNTER — Inpatient Hospital Stay (HOSPITAL_COMMUNITY): Payer: BLUE CROSS/BLUE SHIELD | Admitting: Physical Therapy

## 2018-03-02 ENCOUNTER — Inpatient Hospital Stay (HOSPITAL_COMMUNITY): Payer: BLUE CROSS/BLUE SHIELD

## 2018-03-02 MED ORDER — GABAPENTIN 300 MG PO CAPS
300.0000 mg | ORAL_CAPSULE | Freq: Three times a day (TID) | ORAL | Status: DC
  Administered 2018-03-02 – 2018-03-03 (×3): 300 mg via ORAL
  Filled 2018-03-02 (×3): qty 1

## 2018-03-02 NOTE — Progress Notes (Signed)
Social Work Patient ID: Ashley [name], female   DOB: 10-06-1975, 42 y.o.   MRN: [id number]   Met with pt yesterday afternoon to review team conference.  She is aware and agreeable with team's change of d/c date to 6/18 in order to reach supervision goals.  She denies any other concerns.  Joden Bonsall, LCSW

## 2018-03-02 NOTE — Progress Notes (Signed)
Physical Therapy Session Note  Patient Details  Name: Ashley [name] MRN: [id number] Date of Birth: [birth year]  Today's Date: 03/02/2018 PT Individual Time:1305-1400 AND 1600-1700   55 min and 60 min   Short Term Goals: Week 2:  PT Short Term Goal 1 (Week 2): SGT =LTG due to ELOS   Skilled Therapeutic Interventions/Progress Updates:  Session 1.  Pt received sitting in WC and agreeable to PT. Pt transported to rehab gym in [facility]. Gait training instructed by PT with intermittent supervision-min assist from PT x 144f with min cues for step length and posture. Dynamic gait training to step over multiple 1inch obstacles in floor 4 x 126fwith RW and min cues for AD management.   Stair negotiation training instructed by PT to ascend 6 inch curb step with RW x 2 with min assist from PT and moderate cues for gait pattern and AD management.   Sit<>stand and stand pivot transfers completed with supervision assist x 4 each throughout treatment with only min cues for safe set up.   WC mobility instructed by PT x 15032fith supervision assist and min cues for improved use of momentum to reduce energy expenditure.   Sit<>sup[facility] supervision assist from PT and min cues for RLE management. PT instructed pt in NMR to improve activation of knee extension in sidelying 3 x 12 with AAROM into full Range. PT also performed NMES to R qaud with Empi large muscle atrophy setting. 10 second on, 12 sec rest.   Patient returned to room and left sitting in WC [facility] call bell in reach and all needs met.      Session 2.  Pt received sitting in WC and agreeable to PT. Pt performed WC mobility with supervision assist from PT to day room. PT instructed pt in dynamic standing balance on Wii Fit balance board, penguin slide x 3, table tilt x 3 with min assist from PT and BUE throughout. Moderate cues for use of ankle strategy to improve weight shifting with assist required for L anterior  throughout table tilt.   Pt transported to main entrance of hospital and instructed in gait training over unlevel cement sidewalk x 140f41fth supervision-min assist. Pt noted to have one hear fall due to R knee instability requiring mod-max assist from PT to prevent fall at 100ft61fWC mobility over unlevel cement sidewalk and up/down handicap access toward parking garage x 200ft.35fervision assist from PT as well as cues for improved anterior weight shift to prevent posterior LOB.   Patient returned to room and [facility] and all needs met.           Therapy Documentation Precautions:  Precautions Precautions: Back, Fall Precaution Comments: back precautions reviewed with pt  Restrictions Weight Bearing Restrictions: No Pain: Pain Assessment Pain Scale: 0-10 Pain Score: 0-No pain   See Function Navigator for Current Functional Status.   Therapy/Group: Individual Therapy  AustinLorie Phenix2019, 1:16 PM

## 2018-03-02 NOTE — Patient Care Conference (Signed)
Inpatient RehabilitationTeam Conference and Plan of Care Update Date: 03/01/2018   Time: 2:35 PM    Patient Name: Ashley [name]      Medical Record Number: [id number]  Date of Birth: September 16, 1976 Sex: Female         Room/Bed: 4M02C/4M02C-01 Payor Info: Payor: BLUE CROSS BLUE SHIELD / Plan: BCBS OTHER / Product Type: *No Product type* /    Admitting Diagnosis: L3 Scraum Fixation  Admit Date/Time:  02/21/2018  2:40 PM Admission Comments: No comment available   Primary Diagnosis:  <principal problem not specified> Principal Problem: <principal problem not specified>  Patient Active Problem List   Diagnosis Date Noted  . Pain   . Fall   . Labile blood pressure   . Neuropathic pain   . Therapeutic opioid induced constipation   . Urinary retention   . Lumbar radiculopathy 02/21/2018  . PCOS (polycystic ovarian syndrome)   . Morbid obesity ([place])   . Depression   . Generalized OA   . Postoperative pain   . Acute blood loss anemia   . Status post surgery 02/17/2018  . Lumbar radiculopathy, chronic 02/17/2018  . Primary localized osteoarthritis of right hip 04/21/2016  . Sinusitis 06/25/2015  . Cough 06/25/2015  . Essential hypertension 04/03/2015  . Dehydration 04/03/2015  . CAP (community acquired pneumonia) 03/31/2015  . Right lower lobe pneumonia ([place]) 03/31/2015  . Fever 03/31/2015  . Pregnancy 04/29/2014  . Miscarriage   . Incomplete miscarriage 04/02/2011    Class: Acute  . Deep vein thrombosis (DVT) ([place]) 04/02/2011    Class: Chronic    Expected Discharge Date: Expected Discharge Date: 03/07/18  Team Members Present: Physician leading conference: Dr. Delice Lesch Social Worker Present: Lennart Pall, LCSW Nurse Present: Other (comment)(Chrystal Richardson Landry, RN) PT Present: Barrie Folk, PT;Rosita Dechalus, PTA OT Present: [name], OT SLP Present: Windell Moulding, SLP PPS Coordinator present : Daiva Nakayama, RN, CRRN     Current Status/Progress Goal Weekly Team Focus   Medical   Decreased functional mobility secondary to lumbar spondylosis with radiculopathy.S/P decompression L3-4 and L4-5 with anterior exposure of L5-S1 as well as percutaneous fixation L3 to the sacrum 02/17/2018. Appears to have significant right L3 radiculopathy  Improve mobility, pain, HTN, constipation  See above   Bowel/Bladder   Cont bowel amd bladder;LBM 6/11  maintain bowel and bladder cont      Swallow/Nutrition/ Hydration             ADL's   min guard dynamic standing balance, supervision/setup bathing and dressing with AE, min assist transfers with ambulation  supervision over all   transfers with RW, BLE strengthening, pain management during self-care tasks and functional mobility, ADL retraining with AE   Mobility   min guard sit to supine, min guard transfers, supervision w/c mobility, supervision gait >118ft with RW , min guard 3in step in parallel bars  supervision assist over all for a functional mobility with walker   RLE strength, balance, endurance, d/c planning   Communication             Safety/Cognition/ Behavioral Observations            Pain         2 out of 10    Skin      free from infection/breakdown with min assist        Rehab Goals Patient on target to meet rehab goals: Yes *See Care Plan and progress notes for long and short-term goals.  Barriers to Discharge  Current Status/Progress Possible Resolutions Date Resolved   Physician    Medical stability     See above  Therapies, optimize pain meds, optimize BP/bowel meds      Nursing                  PT  Inaccessible home environment                 OT                  SLP                SW                Discharge Planning/Teaching Needs:  patient to discharge howe w/adult son proviging her care around the clock   Teaching needs TBD    Team Discussion:  Medically stable;  Decreasing pain meds.  Set up b/d with AE (already purchased).  Pt anxious with mobility in the home  environment.  Occasional knee buckling.  Amb 150' with min assist;  Hip and quads weak but a little better since last week.  Still need to address stairs.  Team recommends extension of LOS.  Revisions to Treatment Plan:  Change of d/c date    Continued Need for Acute Rehabilitation Level of Care: The patient requires daily medical management by a physician with specialized training in physical medicine and rehabilitation for the following conditions: Daily direction of a multidisciplinary physical rehabilitation program to ensure safe treatment while eliciting the highest outcome that is of practical value to the patient.: Yes Daily medical management of patient stability for increased activity during participation in an intensive rehabilitation regime.: Yes Daily analysis of laboratory values and/or radiology reports with any subsequent need for medication adjustment of medical intervention for : Post surgical problems;Blood pressure problems;Other  Ashley [name] 03/02/2018, 2:30 PM

## 2018-03-02 NOTE — Progress Notes (Signed)
[place] PHYSICAL MEDICINE & REHABILITATION     PROGRESS NOTE  Subjective/Complaints:  Patient seen lying in bed this morning. She states she slept well overnight.She notes twitching in her hands and feet.  ROS: denies CP, SOB, nausea, vomiting, diarrhea.  Objective: Vital Signs: Blood pressure 108/71, pulse 80, temperature 98.7 F (37.1 C), temperature source Oral, resp. rate 18, height 5\' 11"  (1.803 m), weight 118.8 kg (262 lb), SpO2 100 %. No results found. No results for input(s): WBC, HGB, HCT, PLT in the last 72 hours. Recent Labs    03/01/18 0731  NA 139  K 3.9  CL 101  GLUCOSE 81  BUN 12  CREATININE 0.87  CALCIUM 8.9   CBG (last 3)  Recent Labs    02/28/18 2148  GLUCAP 105*    Wt Readings from Last 3 Encounters:  02/21/18 118.8 kg (262 lb)  02/21/18 118.8 kg (262 lb)  02/08/18 119.2 kg (262 lb 12.8 oz)    Physical Exam:  BP 108/71 (BP Location: Right Arm)   Pulse 80   Temp 98.7 F (37.1 C) (Oral)   Resp 18   Ht 5\' 11"  (1.803 m)   Wt 118.8 kg (262 lb)   SpO2 100%   BMI 36.54 kg/m  Constitutional: No distress . Vital signs reviewed. HENT: Normocephalic.  Atraumatic. Eyes: EOMI. No discharge. Cardiovascular: RRR. No JVD. Respiratory: CTA Bilaterally. Normal effort. GI: BS +. Non-distended. Musc: proximal right lower extremity tenderness Neurological: She isalert.  Motor: B/l UE, LLE 5/5 proximal to distal.  RLE: HF 2/5, KE 1+/5, ADF 5/5 (stable) Skin:warm and dry.  Incisions remain clean dry and intact Psychiatric: Flat affect. Anxious.  Assessment/Plan: 1. Functional deficits secondary to lumbar spondylosis with radiculopathy.S/Pdecompression which require 3+ hours per day of interdisciplinary therapy in a comprehensive inpatient rehab setting. Physiatrist is providing close team supervision and 24 hour management of active medical problems listed below. Physiatrist and rehab team continue to assess barriers to discharge/monitor patient  progress toward functional and medical goals.  Function:  Bathing Bathing position   Position: Shower  Bathing parts Body parts bathed by patient: Right arm, Left arm, Chest, Abdomen, Front perineal area, Right upper leg, Left upper leg, Buttocks, Right lower leg, Left lower leg Body parts bathed by helper: Back  Bathing assist Assist Level: Assistive device, Set up, Supervision or verbal cues Assistive Device Comment: long handled sponge    Upper Body Dressing/Undressing Upper body dressing   What is the patient wearing?: Pull over shirt/dress, Bra Bra - Perfomed by patient: Thread/unthread right bra strap, Thread/unthread left bra strap, Hook/unhook bra (pull down sports bra)   Pull over shirt/dress - Perfomed by patient: Thread/unthread right sleeve, Thread/unthread left sleeve, Put head through opening, Pull shirt over trunk          Upper body assist Assist Level: Set up   Set up : To obtain clothing/put away  Lower Body Dressing/Undressing Lower body dressing   What is the patient wearing?: Underwear, Pants, Shoes, Socks Underwear - Performed by patient: Thread/unthread right underwear leg, Thread/unthread left underwear leg, Pull underwear up/down Underwear - Performed by helper: Pull underwear up/down Pants- Performed by patient: Thread/unthread right pants leg, Thread/unthread left pants leg, Pull pants up/down Pants- Performed by helper: Pull pants up/down   Non-skid slipper socks- Performed by helper: Don/doff right sock, Don/doff left sock Socks - Performed by patient: Don/doff right sock, Don/doff left sock Socks - Performed by helper: Don/doff right sock Shoes - Performed by patient:  Don/doff right shoe, Don/doff left shoe Shoes - Performed by helper: Fasten right          Lower body assist Assist for lower body dressing: Assistive device, Supervision or verbal cues, Set up Assistive Device Comment: reacher, sock aid, shoe horn    Toileting Toileting    Toileting steps completed by patient: Adjust clothing prior to toileting, Performs perineal hygiene, Adjust clothing after toileting Toileting steps completed by helper: Adjust clothing prior to toileting, Performs perineal hygiene, Adjust clothing after toileting(per Starlyn Skeans, NT report) Toileting Assistive Devices: Grab bar or rail  Toileting assist Assist level: Touching or steadying assistance (Pt.75%), More than reasonable time   Transfers Chair/bed transfer   Chair/bed transfer method: Stand pivot Chair/bed transfer assist level: Touching or steadying assistance (Pt > 75%) Chair/bed transfer assistive device: Armrests, Medical sales representative     Max distance: 167ft  Assist level: Touching or steadying assistance (Pt > 75%)   Wheelchair   Type: Manual Max wheelchair distance: 169ft  Assist Level: Supervision or verbal cues  Cognition Comprehension Comprehension assist level: Understands basic 90% of the time/cues < 10% of the time  Expression Expression assist level: Expresses complex ideas: With no assist  Social Interaction Social Interaction assist level: Interacts appropriately with others - No medications needed.  Problem Solving Problem solving assist level: Solves basic 75 - 89% of the time/requires cueing 10 - 24% of the time  Memory Memory assist level: Complete Independence: No helper    Medical Problem List and Plan: 1.Decreased functional mobilitysecondary to lumbar spondylosis with radiculopathy.S/Pdecompression L3-4 and L4-5 with anterior exposure of L5-S1 as well as percutaneous fixation L3 to the sacrum 02/17/2018. Appears to have significant right L3 radiculopathy.Back brace when out of bed  Continue CIR  X-ray reviewed, postoperative changes 2. DVT Prophylaxis/Anticoagulation: SCDs d/ced  Lovenox started on 6/6   Vascular study showing chronic, but not acute DVT 3. Pain Management:  Neurontin 600 mg 4 times daily, increased to 900 3  times a day on 6/10, decreased to 300 3 times a day on 6/13  Baclofen 20 mg 4 times daily, increased to 30 mg 4 times a day on 6/10  Robaxin as needed DC'd on 6/10 due to lack of benefit per patient  OxyContin 10 BID started on 6/4, DC'd on 6/12 in anticipation of discharge  Percocet changed to q6 PRN  Lidoderm patch added on 6/10  Continue regular ice to right thigh  Relatively controlled on 6/13 4. Mood:Wellbutrin XL 300 mg daily, Zoloft 50 mg daily, Xanax 1 mg daily as needed 5. Neuropsych: This patientiscapable of making decisions on herown behalf. 6. Skin/Wound Care:Routine skin checks 7. Fluids/Electrolytes/Nutrition:Routine in and outs  BMP within normal range on 6/12 8.Acute blood loss anemia.   Hemoglobin 9.9 on 6/10  Continue to monitor 9.Hypertension. Cozaar 50 mg daily, HCTZ 12.5 mg daily.   Controlled on 6/13 10.Morbid obesity. BMI 36.54.Follow-up dietary 11.Osteoarthritis. 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 Laxative assistance, increased on 6/5  Improving 13. Urinary retention: Resolved  Urine culture with insignificant growth 14. Assisted fall on right knee  No overt signs of trauma  Continue to monitor  LOS (Days) 9 A FACE TO FACE EVALUATION WAS PERFORMED  Syan Cullimore Lorie Phenix 03/02/2018 8:23 AM

## 2018-03-02 NOTE — Progress Notes (Signed)
Occupational Therapy Session Note  Patient Details  Name: Ashley [name] MRN: [id number] Date of Birth: Sep 27, 1975  Today's Date: 03/02/2018 OT Individual Time: 0945-1100 OT Individual Time Calculation (min): 75 min    Short Term Goals: Week 2:  OT Short Term Goal 1 (Week 2): STG = LTGs due to remaining LOS  Skilled Therapeutic Interventions/Progress Updates:    Pt sitting up in w/c awaiting therapist requesting to shower. Session focused on safety awareness, functional mobility, and b/d tasks. Vc for RW management provided during functional mobility into bathroom. Pt transferred to TTB with CGA and doffed all clothing with (S) while sitting. Pt demo good problem solving skills and adherence to back precautions throughout session. Pt completed all bathing seated on TTB with distant (S). With use of AE, pt able to donn all clothing and brace at (S) level. Pt completed 200 ft of w/c mobility with vc for navigating elevators and propelled outdoors. Min guard provided during 50 ft of functional mobility over uneven surface, with vc provided for RW management and attention to potential trip hazards. Discussion with pt re d/c planning and child care while adhering to back precautions, with several recommendations made. Pt returned to her room and was left sitting in w/c with all needs met.   Therapy Documentation Precautions:  Precautions Precautions: Back, Fall Precaution Comments: back precautions reviewed with pt  Restrictions Weight Bearing Restrictions: No   Pain: Pain Assessment Pain Scale: 0-10 Pain Score: 0-No pain Pain Type: Surgical pain Pain Location: Back Pain Orientation: Lower Pain Intervention(s): Medication (See eMAR)  See Function Navigator for Current Functional Status.   Therapy/Group: Individual Therapy  Curtis Sites 03/02/2018, 11:02 AM

## 2018-03-03 ENCOUNTER — Inpatient Hospital Stay (HOSPITAL_COMMUNITY): Payer: BLUE CROSS/BLUE SHIELD | Admitting: Physical Therapy

## 2018-03-03 ENCOUNTER — Inpatient Hospital Stay (HOSPITAL_COMMUNITY): Payer: BLUE CROSS/BLUE SHIELD

## 2018-03-03 DIAGNOSIS — F411 Generalized anxiety disorder: Secondary | ICD-10-CM

## 2018-03-03 MED ORDER — POLYETHYLENE GLYCOL 3350 17 G PO PACK: 17.0000 g | PACK | Freq: Every day | ORAL | Status: DC | PRN

## 2018-03-03 NOTE — Progress Notes (Signed)
[place] PHYSICAL MEDICINE & REHABILITATION     PROGRESS NOTE  Subjective/Complaints:  Patient seen lying in bed this morning. She states she slept fairly overnight. She complains about pain in her lower back and leg. She notes persistent tremors.  ROS: denies CP, SOB, nausea, vomiting, diarrhea.  Objective: Vital Signs: Blood pressure 131/84, pulse 85, temperature 98.5 F (36.9 C), temperature source Oral, resp. rate 18, height 5\' 11"  (1.803 m), weight 118.8 kg (262 lb), SpO2 99 %. No results found. No results for input(s): WBC, HGB, HCT, PLT in the last 72 hours. Recent Labs    03/01/18 0731  NA 139  K 3.9  CL 101  GLUCOSE 81  BUN 12  CREATININE 0.87  CALCIUM 8.9   CBG (last 3)  Recent Labs    02/28/18 2148  GLUCAP 105*    Wt Readings from Last 3 Encounters:  02/21/18 118.8 kg (262 lb)  02/21/18 118.8 kg (262 lb)  02/08/18 119.2 kg (262 lb 12.8 oz)    Physical Exam:  BP 131/84 (BP Location: Left Arm)   Pulse 85   Temp 98.5 F (36.9 C) (Oral)   Resp 18   Ht 5\' 11"  (1.803 m)   Wt 118.8 kg (262 lb)   SpO2 99%   BMI 36.54 kg/m  Constitutional: No distress . Vital signs reviewed. HENT: Normocephalic.  Atraumatic. Eyes: EOMI. No discharge. Cardiovascular: RRR. No JVD. Respiratory: CTA Bilaterally. Normal effort. GI: BS +. Non-distended. Musc: proximal right lower extremity tenderness Neurological: She isalert.  Motor: B/l UE, LLE 5/5 proximal to distal.  RLE: HF 2/5, KE 1+/5, ADF 5/5 (unchanged) Skin:warm and dry.  Incisions remain clean dry and intact Psychiatric: Flat affect. Anxious.  Assessment/Plan: 1. Functional deficits secondary to lumbar spondylosis with radiculopathy.S/Pdecompression which require 3+ hours per day of interdisciplinary therapy in a comprehensive inpatient rehab setting. Physiatrist is providing close team supervision and 24 hour management of active medical problems listed below. Physiatrist and rehab team continue to assess  barriers to discharge/monitor patient progress toward functional and medical goals.  Function:  Bathing Bathing position   Position: Shower  Bathing parts Body parts bathed by patient: Right arm, Left arm, Chest, Abdomen, Front perineal area, Right upper leg, Left upper leg, Buttocks, Right lower leg, Left lower leg, Back Body parts bathed by helper: Back  Bathing assist Assist Level: Set up, Assistive device Assistive Device Comment: long handled sponge Set up : To obtain items  Upper Body Dressing/Undressing Upper body dressing   What is the patient wearing?: Pull over shirt/dress, Bra Bra - Perfomed by patient: Thread/unthread right bra strap, Thread/unthread left bra strap, Hook/unhook bra (pull down sports bra)   Pull over shirt/dress - Perfomed by patient: Thread/unthread right sleeve, Thread/unthread left sleeve, Put head through opening, Pull shirt over trunk          Upper body assist Assist Level: More than reasonable time   Set up : To obtain clothing/put away  Lower Body Dressing/Undressing Lower body dressing   What is the patient wearing?: Underwear, Pants, Shoes, Socks Underwear - Performed by patient: Thread/unthread right underwear leg, Thread/unthread left underwear leg, Pull underwear up/down Underwear - Performed by helper: Pull underwear up/down Pants- Performed by patient: Thread/unthread right pants leg, Thread/unthread left pants leg, Pull pants up/down Pants- Performed by helper: Pull pants up/down   Non-skid slipper socks- Performed by helper: Don/doff right sock, Don/doff left sock Socks - Performed by patient: Don/doff right sock, Don/doff left sock Socks - Performed  by helper: Don/doff right sock Shoes - Performed by patient: Don/doff right shoe, Don/doff left shoe Shoes - Performed by helper: Fasten right          Lower body assist Assist for lower body dressing: Assistive device, Set up Assistive Device Comment: reacher, sock aid, shoe  horn Set up : To obtain clothing/put away  Toileting Toileting Toileting activity did not occur: No continent bowel/bladder event Toileting steps completed by patient: Adjust clothing prior to toileting, Performs perineal hygiene, Adjust clothing after toileting Toileting steps completed by helper: Adjust clothing prior to toileting, Performs perineal hygiene, Adjust clothing after toileting(per Starlyn Skeans, NT report) Toileting Assistive Devices: Grab bar or rail  Toileting assist Assist level: Touching or steadying assistance (Pt.75%), More than reasonable time   Transfers Chair/bed transfer   Chair/bed transfer method: Ambulatory Chair/bed transfer assist level: Supervision or verbal cues Chair/bed transfer assistive device: Armrests, Medical sales representative     Max distance: 135ft  Assist level: Touching or steadying assistance (Pt > 75%)   Wheelchair   Type: Manual Max wheelchair distance: 129ft  Assist Level: Supervision or verbal cues  Cognition Comprehension Comprehension assist level: Understands complex 90% of the time/cues 10% of the time  Expression Expression assist level: Expresses complex ideas: With no assist  Social Interaction Social Interaction assist level: Interacts appropriately with others - No medications needed.  Problem Solving Problem solving assist level: Solves complex 90% of the time/cues < 10% of the time  Memory Memory assist level: Complete Independence: No helper    Medical Problem List and Plan: 1.Decreased functional mobilitysecondary to lumbar spondylosis with radiculopathy.S/Pdecompression L3-4 and L4-5 with anterior exposure of L5-S1 as well as percutaneous fixation L3 to the sacrum 02/17/2018. Appears to have significant right L3 radiculopathy.Back brace when out of bed  Continue CIR  X-ray reviewed, postoperative changes 2. DVT Prophylaxis/Anticoagulation: SCDs d/ced  Lovenox started on 6/6   Vascular study showing  chronic, but not acute DVT 3. Pain Management:  Neurontin 600 mg 4 times daily, increased to 900 3 times a day on 6/10, decreased to 300 3 times a day on 6/13, DC'd on 6/14 due to potential side effects  Baclofen 20 mg 4 times daily, increased to 30 mg 4 times a day on 6/10  Robaxin as needed DC'd on 6/10 due to lack of benefit per patient  OxyContin 10 BID started on 6/4, DC'd on 6/12  Percocet changed to q6 PRN  Lidoderm patch added on 6/10  K Pat ordered on 6/14  Not limiting functional progress, needs consistent reassurance and reminding of coping strategies 4. Mood:Wellbutrin XL 300 mg daily, Zoloft 50 mg daily, Xanax 1 mg daily as needed 5. Neuropsych: This patientiscapable of making decisions on herown behalf. 6. Skin/Wound Care:Routine skin checks 7. Fluids/Electrolytes/Nutrition:Routine in and outs  BMP within normal range on 6/12 8.Acute blood loss anemia.   Hemoglobin 9.9 on 6/10  Labs ordered for Monday  Continue to monitor 9.Hypertension. Cozaar 50 mg daily, HCTZ 12.5 mg daily.   Relatively controlled 6/14 10.Morbid obesity. BMI 36.54.Follow-up dietary 11.Osteoarthritis. 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 Laxative assistance, increased on 6/5  Improving 13. Urinary retention: Resolved  Urine culture with insignificant growth  LOS (Days) 10 A FACE TO FACE EVALUATION WAS PERFORMED  Ankit Lorie Phenix 03/03/2018 8:34 AM

## 2018-03-03 NOTE — Discharge Instructions (Signed)
Inpatient Rehab Discharge Instructions  Ashley [name] Discharge date and time: No discharge date for patient encounter.   Activities/Precautions/ Functional Status: Activity: Back brace when out of bed Diet: regular diet Wound Care: keep wound clean and dry Functional status:  ___ No restrictions     ___ Walk up steps independently ___ 24/7 supervision/assistance   ___ Walk up steps with assistance ___ Intermittent supervision/assistance  ___ Bathe/dress independently ___ Walk with walker     _x__ Bathe/dress with assistance ___ Walk Independently    ___ Shower independently ___ Walk with assistance    ___ Shower with assistance ___ No alcohol     ___ Return to work/school ________   COMMUNITY REFERRALS UPON DISCHARGE:    Home Health:   PT     OT                      Agency:  [facility] Phone: [phone]   Medical Equipment/Items Ordered: wheelchair, rolling walker                                                     Agency/Supplier:  [facility] [phone]      Special Instructions: No driving   My questions have been answered and I understand these instructions. I will adhere to these goals and the provided educational materials after my discharge from the hospital.  Patient/Caregiver Signature _______________________________ Date __________  Clinician Signature _______________________________________ Date __________  Please bring this form and your medication list with you to all your follow-up doctor's appointments.

## 2018-03-03 NOTE — Progress Notes (Signed)
Physical Therapy Session Note  Patient Details  Name: Ashley [name] MRN: [id number] Date of Birth: Jan 27, 1976  Today's Date: 03/03/2018 PT Individual Time: 1105-1200 AND 1500-1555   60mn AND 55 min   Short Term Goals: Week 1:  PT Short Term Goal 1 (Week 1): Pt will perform bed mobility with supervision assist  PT Short Term Goal 1 - Progress (Week 1): Met PT Short Term Goal 2 (Week 1): Pt will ambulate 524fwith supervision assist  PT Short Term Goal 2 - Progress (Week 1): Met PT Short Term Goal 3 (Week 1): Pt will perform WC mobility with supervision assist up to 1523fPT Short Term Goal 3 - Progress (Week 1): Met PT Short Term Goal 4 (Week 1): Pt will ascend 1 step with LRAD and min assist  PT Short Term Goal 4 - Progress (Week 1): Partly met Week 2:  PT Short Term Goal 1 (Week 2): SGT =LTG due to ELOS   Skilled Therapeutic Interventions/Progress Updates:   Family education for gait x [id number] transfers including sit<>stand and stand pivot, shower transfer with shower bench, bed mobility for sit<>supine as well as rolling R and L, and car transfer to sedan. Pt required close supervision assist with RW for all mobility tasks listed above with min cues required for proper set up and for safety awareness. Verbal and visual instruction to son (primary care giver) for safe guard techniques and proper assistance technique for bed mobility when pt is fatigued. Family education also provided for step management with RW to simulate access to home Patient returned to room and [facility] in reach and all needs met.    Session 2.  Pt received sitting in WC and agreeable to PT. Pt performed WC mobility 2x [id number] distant supervision assist and min cues for set up for trasnfers. Stand pivot transfers completed x 4 throughout treatment with supervision assist and RW. PT instructed pt and son in home stretching program to improve hip mobility and  Maintain full ROM for HS,  hip adduction, quads, HF in supine and sidelying. Also instructed pt in HEP for sidelying knee flexion/extension  With gradded manual resistance to improved stength beyond 2/5. Gait training with RW x 120ft 64f supervision assist, RW, and min cues for step height on the RLE. Nustep reciprocal strengthening/endurance training x 6 min level 3 +2 min level 4 with supervision assist from PT for LE use only and decreased compensatory movements. Patient returned to room and [facility] reach and all needs met.         Therapy Documentation Precautions:  Precautions Precautions: Back, Fall Precaution Comments: back precautions reviewed with pt  Restrictions Weight Bearing Restrictions: No Pain: denies.   See Function Navigator for Current Functional Status.   Therapy/Group: Individual Therapy  AustinLorie Phenix2019, 1:29 PM

## 2018-03-03 NOTE — Plan of Care (Signed)
  Problem: SCI BOWEL ELIMINATION Goal: RH STG MANAGE BOWEL WITH ASSISTANCE Description STG Manage Bowel with Min Assistance.  Outcome: Progressing Goal: RH STG SCI MANAGE BOWEL WITH MEDICATION WITH ASSISTANCE Description STG SCI Manage bowel with medication with Min assistance.  Outcome: Progressing Goal: RH STG SCI MANAGE BOWEL PROGRAM W/ASSIST OR AS APPROPRIATE Description STG SCI Manage bowel program w/ Min assist or as appropriate.  Outcome: Progressing Goal: RH OTHER STG BOWEL ELIMINATION GOALS W/ASSIST Description Other STG Bowel Elimination Goals With [place].  Outcome: Progressing   Problem: SCI BLADDER ELIMINATION Goal: RH STG MANAGE BLADDER WITH ASSISTANCE Description STG Manage Bladder With Min Assistance  Outcome: Progressing   Problem: RH SKIN INTEGRITY Goal: RH STG SKIN FREE OF INFECTION/BREAKDOWN Outcome: Progressing Goal: RH STG MAINTAIN SKIN INTEGRITY WITH ASSISTANCE Description STG Maintain Skin Integrity With [place].  Outcome: Progressing Goal: RH STG ABLE TO PERFORM INCISION/WOUND CARE W/ASSISTANCE Description STG Able To Perform Incision/Wound Care With [employer].  Outcome: Progressing Goal: RH OTHER STG SKIN INTEGRITY GOALS W/ASSIST Description Other STG Skin Integrity Goals With Assistance. Outcome: Progressing   Problem: RH SAFETY Goal: RH STG ADHERE TO SAFETY PRECAUTIONS W/ASSISTANCE/DEVICE Description STG Adhere to Safety Precautions With min  Assistance/Device.  Outcome: Progressing Goal: RH STG DECREASED RISK OF FALL WITH ASSISTANCE Description STG Decreased Risk of Fall With [employer].  Outcome: Progressing Goal: RH STG DEMO UNDERSTANDING HOME SAFETY PRECAUTIONS Outcome: Progressing Goal: RH OTHER STG SAFETY GOALS W/ASSIST Description Other STG Safety Goals With Assistance. Outcome: Progressing   Problem: RH PAIN MANAGEMENT Goal: RH STG PAIN MANAGED AT OR BELOW PT'S PAIN GOAL Description Patient will be  pain free or less than 3  Outcome: Progressing   Problem: RH KNOWLEDGE DEFICIT SCI Goal: RH STG INCREASE KNOWLEDGE OF SELF CARE AFTER SCI Outcome: Progressing

## 2018-03-03 NOTE — Progress Notes (Signed)
Occupational Therapy Session Note  Patient Details  Name: Ashley [name] MRN: [id number] Date of Birth: [birth year]  Today's Date: 03/03/2018 OT Individual Time: 6047-9987 OT Individual Time Calculation (min): 70 min    Short Term Goals: Week 1:  OT Short Term Goal 1 (Week 1): Pt will complete LB bathing with min assist with AE as needed OT Short Term Goal 1 - Progress (Week 1): Met OT Short Term Goal 2 (Week 1): Pt will complete LB dressing with min assist with AE as needed OT Short Term Goal 2 - Progress (Week 1): Met OT Short Term Goal 3 (Week 1): Pt will complete toilet transfers with min assist with LRAD OT Short Term Goal 3 - Progress (Week 1): Met  Skilled Therapeutic Interventions/Progress Updates:    1;1. Pt requesting to shower this session and 5/10 pain. RN aware and lidocane patches applied after bathing. Pt completes ambulatory transfer EOB<>TTB with RW with close supervision. Pt completes bathing seated on TTB with set up leaning laterally for bathing buttocks and LHSS for washing B feet. Pt dresses at sit to stand level with supervision using AE for LB dressing and RW for standing balance. Pt completes oral care at sink with set up seated. Pt compeltes BUE therex with 3-5 # dumbells for the following exercises shoulder flex/ext, internal/external rot, elevation/depression, protraction/retraction, elbow flexion/ext and chest/shoulder press 1x15 reps for strengthening required for BADLs/trasnfers. Exited session with pt seated in w/c, call light in reach and all needs met  Therapy Documentation Precautions:  Precautions Precautions: Back, Fall Precaution Comments: back precautions reviewed with pt  Restrictions Weight Bearing Restrictions: No  See Function Navigator for Current Functional Status.   Therapy/Group: Individual Therapy  [name] 03/03/2018, 9:06 AM

## 2018-03-04 ENCOUNTER — Inpatient Hospital Stay (HOSPITAL_COMMUNITY): Payer: BLUE CROSS/BLUE SHIELD | Admitting: Physical Therapy

## 2018-03-04 NOTE — Progress Notes (Signed)
Physical Therapy Session Note  Patient Details  Name: SUHA SCHOENBECK MRN: [id number] Date of Birth: 08/13/1976  Today's Date: 03/04/2018 PT Individual Time: 1834-3735 PT Individual Time Calculation (min): 25 min   Short Term Goals: Week 2:  PT Short Term Goal 1 (Week 2): SGT =LTG due to ELOS   Skilled Therapeutic Interventions/Progress Updates:   Pt in w/c and agreeable to therapy, denies pain but reports some soreness. Session focused on global strengthening and endurance. Pt self-propelled w/c >150' to dayroom w/ supervision for UE strengthening. Performed NuStep @ level 3 for LE strengthening and RLE quad activation, 8 min. Pt self-propelled w/c back towards room and ambulated last 100' w/ RW and supervision. Ended session in w/c, call bell within reach and all needs met.   Therapy Documentation Precautions:  Precautions Precautions: Back, Fall Precaution Comments: back precautions reviewed with pt  Restrictions Weight Bearing Restrictions: No  See Function Navigator for Current Functional Status.   Therapy/Group: Individual Therapy  Toshiye Kever K Arnette 03/04/2018, 12:27 PM

## 2018-03-04 NOTE — Progress Notes (Signed)
[place] PHYSICAL MEDICINE & REHABILITATION     PROGRESS NOTE  Subjective/Complaints:  Patient is lying in bed.  She has no new complaints.  She continues to have some back and leg pain.  She describes these discomforts as a deep ache.  There is been no change.  She denies any change in neurologic symptoms.  Objective: Vital Signs:   Physical Exam:  BP 128/84 (BP Location: Right Arm)   Pulse 87   Temp 98.3 F (36.8 C) (Oral)   Resp 17   Ht 5\' 11"  (1.803 m)   Wt 262 lb (118.8 kg)   SpO2 99%   BMI 36.54 kg/m  Constitutional: No distress . Vital signs reviewed. HENT: Normocephalic.  Atraumatic. Eyes: EOMI. No discharge. Cardiovascular: RRR. No JVD. Respiratory: CTA Bilaterally. Normal effort. GI: BS +. Non-distended. Musc: proximal right lower extremity tenderness Neurological: She isalert.  Motor: B/l UE, LLE 5/5 proximal to distal.  RLE: HF 2/5, KE 1+/5, ADF 5/5 (unchanged) Skin:warm and dry.  Incisions remain clean dry and intact Psychiatric: Flat affect. Anxious.  Overweight female in no acute distress.  She appears somewhat anxious. HEENT exam atraumatic, normocephalic Extraocular muscles are intact Cardiac exam S1 and S2 are regular without Chest clear to auscultation Abdominal exam overweight, active bowel sounds, soft. Extremities without significant edema.  Assessment/Plan: 1. Functional deficits secondary to lumbar spondylosis with radiculopathy.S/Pdecompression   Medical Problem List and Plan: 1. Lumbar spondylosis with radiculopathy.  She has had decompression of L3-4 and L4-5 continues to have symptoms of a right L3 radiculopathy.  She wears a back brace when out of bed. 2. DVT Prophylaxis/Anticoagulation: Known chronic DVT . Continue meds. 3. Pain Management: She is on a robust medication regimen.  Medications have been changed over the past several days.  I will not change any medications at this time.  It does seem that her pain is somewhat  improving. 4. Mood:Wellbutrin XL 300 mg daily, Zoloft 50 mg daily, Xanax 1 mg daily as needed Continues to have a flat affect. 5. Neuropsych: This patientiscapable of making decisions on herown behalf. 6. Skin/Wound Care:Routine skin checks 7. Fluids/Electrolytes/Nutrition:Routine in and outs  BMP within normal range on 6/12 8.Acute blood loss anemia.    Lab Results  Component Value Date   HGB 9.9 (L) 02/27/2018    9.Hypertension. Controlled on 615. 10.Morbid obesity. BMI 36.54.Follow-up dietary 11.Osteoarthritis. 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 Laxative assistance, increased on 6/5  Improving 13. Urinary retention: Resolved  Urine culture with insignificant growth  LOS (Days) 11 A FACE TO FACE EVALUATION WAS PERFORMED  Bruce H Swords 03/04/2018 9:47 AM

## 2018-03-04 NOTE — Plan of Care (Signed)
  Problem: SCI BOWEL ELIMINATION Goal: RH STG MANAGE BOWEL WITH ASSISTANCE Description STG Manage Bowel with Min Assistance.  Outcome: Progressing Goal: RH STG SCI MANAGE BOWEL WITH MEDICATION WITH ASSISTANCE Description STG SCI Manage bowel with medication with Min assistance.  Outcome: Progressing Goal: RH STG SCI MANAGE BOWEL PROGRAM W/ASSIST OR AS APPROPRIATE Description STG SCI Manage bowel program w/ Min assist or as appropriate.  Outcome: Progressing Goal: RH OTHER STG BOWEL ELIMINATION GOALS W/ASSIST Description Other STG Bowel Elimination Goals With [place].  Outcome: Progressing   Problem: SCI BLADDER ELIMINATION Goal: RH STG MANAGE BLADDER WITH ASSISTANCE Description STG Manage Bladder With Min Assistance  Outcome: Progressing   Problem: RH SKIN INTEGRITY Goal: RH STG SKIN FREE OF INFECTION/BREAKDOWN Outcome: Progressing Goal: RH STG MAINTAIN SKIN INTEGRITY WITH ASSISTANCE Description STG Maintain Skin Integrity With [place].  Outcome: Progressing Goal: RH STG ABLE TO PERFORM INCISION/WOUND CARE W/ASSISTANCE Description STG Able To Perform Incision/Wound Care With [employer].  Outcome: Progressing Goal: RH OTHER STG SKIN INTEGRITY GOALS W/ASSIST Description Other STG Skin Integrity Goals With Assistance. Outcome: Progressing   Problem: RH SAFETY Goal: RH STG ADHERE TO SAFETY PRECAUTIONS W/ASSISTANCE/DEVICE Description STG Adhere to Safety Precautions With min  Assistance/Device.  Outcome: Progressing Goal: RH STG DECREASED RISK OF FALL WITH ASSISTANCE Description STG Decreased Risk of Fall With [employer].  Outcome: Progressing Goal: RH STG DEMO UNDERSTANDING HOME SAFETY PRECAUTIONS Outcome: Progressing Goal: RH OTHER STG SAFETY GOALS W/ASSIST Description Other STG Safety Goals With Assistance. Outcome: Progressing   Problem: RH PAIN MANAGEMENT Goal: RH STG PAIN MANAGED AT OR BELOW PT'S PAIN GOAL Description Patient will be  pain free or less than 3  Outcome: Progressing   Problem: RH KNOWLEDGE DEFICIT SCI Goal: RH STG INCREASE KNOWLEDGE OF SELF CARE AFTER SCI Outcome: Progressing

## 2018-03-05 ENCOUNTER — Inpatient Hospital Stay (HOSPITAL_COMMUNITY): Payer: BLUE CROSS/BLUE SHIELD | Admitting: Occupational Therapy

## 2018-03-05 NOTE — Progress Notes (Addendum)
Occupational Therapy Session Note  Patient Details  Name: Ashley [name] MRN: [id number] Date of Birth: 09-05-1976  Today's Date: 03/05/2018 OT Individual Time: 3794-3276 OT Individual Time Calculation (min): 42 min   Skilled Therapeutic Interventions/Progress Updates:    Pt greeted in w/c with back brace donned, ready for shower. She ambulated with RW to TTB with supervision and completed bathing with setup while seated on TTB. Dressing completed afterwards in w/c at sit<stand level with pt able to manage back brace herself and use reacher as needed. Provided her with walker and reacher bags for ease of item transport during ADL/IADL routine at home. Also discussed additional AE that she'd benefit from purchasing, with pt and therapist searching for leg lifter and shoe funnel via online sources so she can have them for home. Pt has already bought most of the AE she'll need at d/c. Looking forward to Tuesday. Pt left with RN at session exit.   Therapy Documentation Precautions:  Precautions Precautions: Back, Fall Precaution Comments: back precautions reviewed with pt  Restrictions Weight Bearing Restrictions: No Pain: Pain Assessment Pain Scale: Faces Faces Pain Scale: No hurt ADL:      See Function Navigator for Current Functional Status.   Therapy/Group: Individual Therapy  Ashley [name] 03/05/2018, 12:07 PM

## 2018-03-05 NOTE — Plan of Care (Signed)
  Problem: SCI BOWEL ELIMINATION Goal: RH STG MANAGE BOWEL WITH ASSISTANCE Description STG Manage Bowel with Min Assistance.  Outcome: Progressing Goal: RH STG SCI MANAGE BOWEL WITH MEDICATION WITH ASSISTANCE Description STG SCI Manage bowel with medication with Min assistance.  Outcome: Progressing Goal: RH STG SCI MANAGE BOWEL PROGRAM W/ASSIST OR AS APPROPRIATE Description STG SCI Manage bowel program w/ Min assist or as appropriate.  Outcome: Progressing Goal: RH OTHER STG BOWEL ELIMINATION GOALS W/ASSIST Description Other STG Bowel Elimination Goals With [place].  Outcome: Progressing   Problem: SCI BLADDER ELIMINATION Goal: RH STG MANAGE BLADDER WITH ASSISTANCE Description STG Manage Bladder With Min Assistance  Outcome: Progressing   Problem: RH SKIN INTEGRITY Goal: RH STG SKIN FREE OF INFECTION/BREAKDOWN Outcome: Progressing Goal: RH STG MAINTAIN SKIN INTEGRITY WITH ASSISTANCE Description STG Maintain Skin Integrity With [place].  Outcome: Progressing Goal: RH STG ABLE TO PERFORM INCISION/WOUND CARE W/ASSISTANCE Description STG Able To Perform Incision/Wound Care With [employer].  Outcome: Progressing Goal: RH OTHER STG SKIN INTEGRITY GOALS W/ASSIST Description Other STG Skin Integrity Goals With Assistance. Outcome: Progressing   Problem: RH SAFETY Goal: RH STG ADHERE TO SAFETY PRECAUTIONS W/ASSISTANCE/DEVICE Description STG Adhere to Safety Precautions With min  Assistance/Device.  Outcome: Progressing Goal: RH STG DECREASED RISK OF FALL WITH ASSISTANCE Description STG Decreased Risk of Fall With [employer].  Outcome: Progressing Goal: RH STG DEMO UNDERSTANDING HOME SAFETY PRECAUTIONS Outcome: Progressing Goal: RH OTHER STG SAFETY GOALS W/ASSIST Description Other STG Safety Goals With Assistance. Outcome: Progressing   Problem: RH PAIN MANAGEMENT Goal: RH STG PAIN MANAGED AT OR BELOW PT'S PAIN GOAL Description Patient will be  pain free or less than 3  Outcome: Progressing   Problem: RH KNOWLEDGE DEFICIT SCI Goal: RH STG INCREASE KNOWLEDGE OF SELF CARE AFTER SCI Outcome: Progressing

## 2018-03-05 NOTE — Progress Notes (Signed)
Ashley [name] PHYSICAL MEDICINE & REHABILITATION     PROGRESS NOTE  Subjective/Complaints:  Patient is lying in bed.  She has no new complaints.  She says her back pain is better.  She has some discomfort in her right leg.  She also notes that she has difficulty lifting her right leg.  (Ongoing problem). Objective: Vital Signs:   Physical Exam:  BP 139/87 (BP Location: Right Arm)   Pulse 86   Temp 98.3 F (36.8 C) (Oral)   Resp 17   Ht 5' 11"  (1.803 m)   Wt 262 lb (118.8 kg)   SpO2 99%   BMI 36.54 kg/m    Overweight female in no acute distress.  She appears somewhat anxious. HEENT exam atraumatic, normocephalic Extraocular muscles are intact Cardiac exam S1 and S2 are regular without Chest clear to auscultation Abdominal exam overweight, active bowel sounds, soft. Extremities without significant edema.  She has normal strength in her left lower extremity.  She is unable to lift her right leg off the bed against gravity.  Assessment/Plan: 1. Functional deficits secondary to lumbar spondylosis with radiculopathy.S/Pdecompression   Medical Problem List and Plan: 1. Lumbar spondylosis with radiculopathy.  She has had decompression of L3-4 and L4-5 continues to have symptoms of a right L3 radiculopathy.  She wears a back brace when out of bed. 2. DVT Prophylaxis/Anticoagulation: Known chronic DVT . Continue meds. 3. Pain Management: She is on a robust medication regimen.  She states that her pain is improved.  She thinks the lidocaine patches helped significantly. 4. Mood:Continue current medications.  She does have a flat affect. 5. Neuropsych: This patientiscapable of making decisions on herown behalf. 6. Skin/Wound Care:Routine skin checks 7. Fluids/Electrolytes/Nutrition:No concerns.  Be met on June 12 normal. 8.Acute blood loss anemia.    Lab Results  Component Value Date   HGB 9.9 (L) 02/27/2018    9.Hypertension. Controlled. 10.Morbid obesity. BMI  36.54.Follow-up dietary 11.Osteoarthritis.  No significant discomfort at this time. 12.Constipation.  Resolved. 13. Urinary retention: Resolved  Urine culture with insignificant growth  LOS (Days) 12 A FACE TO FACE EVALUATION WAS PERFORMED  Ashley [name] Ashley [name] 03/05/2018 8:36 AM

## 2018-03-06 ENCOUNTER — Inpatient Hospital Stay (HOSPITAL_COMMUNITY): Payer: BLUE CROSS/BLUE SHIELD | Admitting: Physical Therapy

## 2018-03-06 ENCOUNTER — Inpatient Hospital Stay (HOSPITAL_COMMUNITY): Payer: BLUE CROSS/BLUE SHIELD | Admitting: Occupational Therapy

## 2018-03-06 LAB — CBC WITH DIFFERENTIAL/PLATELET
Abs Immature Granulocytes: 0 10*3/uL (ref 0.0–0.1)
Basophils Absolute: 0.1 10*3/uL (ref 0.0–0.1)
Basophils Relative: 1 %
Eosinophils Absolute: 0.2 10*3/uL (ref 0.0–0.7)
Eosinophils Relative: 3 %
HCT: 35.7 % — ABNORMAL LOW (ref 36.0–46.0)
Hemoglobin: 10.8 g/dL — ABNORMAL LOW (ref 12.0–15.0)
Immature Granulocytes: 1 %
Lymphocytes Relative: 21 %
Lymphs Abs: 1.5 10*3/uL (ref 0.7–4.0)
MCH: 25 pg — ABNORMAL LOW (ref 26.0–34.0)
MCHC: 30.3 g/dL (ref 30.0–36.0)
MCV: 82.6 fL (ref 78.0–100.0)
Monocytes Absolute: 0.6 10*3/uL (ref 0.1–1.0)
Monocytes Relative: 8 %
Neutro Abs: 4.7 10*3/uL (ref 1.7–7.7)
Neutrophils Relative %: 66 %
Platelets: 297 10*3/uL (ref 150–400)
RBC: 4.32 MIL/uL (ref 3.87–5.11)
RDW: 17 % — ABNORMAL HIGH (ref 11.5–15.5)
WBC: 7.1 10*3/uL (ref 4.0–10.5)

## 2018-03-06 NOTE — Progress Notes (Signed)
Occupational Therapy Discharge Summary  Patient Details  Name: Ashley [name] MRN: [id number] Date of Birth: [birth year]  Patient has met 10 of 10 long term goals due to improved activity tolerance, improved balance, ability to compensate for deficits and improved awareness.  Patient to discharge at overall Supervision level.  Patient's care partner is independent to provide the necessary intermittent supervision assistance at discharge.    Reasons goals not met: N/A  Recommendation:  Patient will benefit from ongoing skilled OT services in home health setting to continue to advance functional skills in the area of BADL and Reduce care partner burden.  Equipment: No equipment provided.  Patient has bathroom DME from previous surgeries.  Reasons for discharge: treatment goals met and discharge from hospital  Patient/family agrees with progress made and goals achieved: Yes  OT Discharge Precautions/Restrictions  Precautions Precautions: Back;Fall Precaution Comments: pt able to recall 3/3 back precautions Pain  Pt reports 7/10 in lower back and Rt thigh.  Premedicated ADL  See Function Navigator Vision Baseline Vision/History: Wears glasses Wears Glasses: At all times Patient Visual Report: No change from baseline Vision Assessment?: No apparent visual deficits Perception  Perception: Within Functional Limits Praxis Praxis: Intact Cognition Overall Cognitive Status: Within Functional Limits for tasks assessed Arousal/Alertness: Awake/alert Orientation Level: Oriented X4 Attention: Alternating Alternating Attention: Appears intact Memory: Appears intact Awareness: Appears intact Problem Solving: Appears intact Safety/Judgment: Appears intact Sensation Sensation Light Touch: Impaired Detail Light Touch Impaired Details: Impaired RLE Additional Comments: mild sensory deficits in the L4-L5 dermatome Coordination Gross Motor Movements are Fluid and Coordinated: No Fine  Motor Movements are Fluid and Coordinated: Yes Finger Nose Finger Test: WNL Extremity/Trunk Assessment RUE Assessment RUE Assessment: Within Functional Limits Active Range of Motion (AROM) Comments: WFL General Strength Comments: 5/5 LUE Assessment LUE Assessment: Within Functional Limits Active Range of Motion (AROM) Comments: WFL General Strength Comments: 5/5   See Function Navigator for Current Functional Status.  [name] 03/06/2018, 12:16 PM

## 2018-03-06 NOTE — Discharge Summary (Signed)
Discharge summary job [id number]

## 2018-03-06 NOTE — Progress Notes (Signed)
Physical Therapy Discharge Summary  Patient Details  Name: Ashley [name] MRN: [id number] Date of Birth: 03/03/1976  Today's Date: 03/06/2018    Patient has met 11 of 11 long term goals due to improved activity tolerance, improved balance, improved postural control and improved coordination.  Patient to discharge at an ambulatory level Supervision.   Patient's care partner is independent to provide the necessary physical assistance at discharge.  Reasons goals not met: N/A  Recommendation:  Patient will benefit from ongoing skilled PT services in home health setting to continue to advance safe functional mobility, address ongoing impairments in balance, strength, gait, endurance, motor control, and minimize fall risk.  Equipment: RW and WC  Reasons for discharge: treatment goals met  Patient/family agrees with progress made and goals achieved: Yes  PT Discharge Precautions/Restrictions   Vital Signs Therapy Vitals Temp: 99.1 F (37.3 C) Temp Source: Oral Pulse Rate: 86 Resp: 19 BP: 118/82 Patient Position (if appropriate): Sitting Oxygen Therapy SpO2: 99 % O2 Device: Room Air Pain   Vision/Perception     Cognition Overall Cognitive Status: Within Functional Limits for tasks assessed Arousal/Alertness: Awake/alert Orientation Level: Oriented X4 Attention: Alternating Alternating Attention: Appears intact Memory: Appears intact Awareness: Appears intact Problem Solving: Appears intact Safety/Judgment: Appears intact Sensation Sensation Light Touch: Impaired Detail Light Touch Impaired Details: Impaired RLE Additional Comments: mild sensory deficits in the L4-L5 dermatome Coordination Gross Motor Movements are Fluid and Coordinated: No Fine Motor Movements are Fluid and Coordinated: Yes Coordination and Movement Description: limited by strenth in the RLE Motor  Motor Motor: Abnormal tone;Paraplegia Motor - Discharge Observations: RLE weakness   Mobility Bed Mobility Bed Mobility: Rolling Right;Rolling Left;Supine to Sit;Sit to Supine Rolling Right: Independent Rolling Left: Independent Supine to Sit: Independent with assistive device Sit to Supine: Independent with assistive device Transfers Transfers: Stand to Sit;Stand Pivot Transfers;Sit to Stand Sit to Stand: Independent with assistive device Stand to Sit: Independent with assistive device Stand Pivot Transfers: Independent with assistive device Transfer (Assistive device): Rolling walker Locomotion  Gait Ambulation: Yes Gait Assistance: Supervision/Verbal cueing Gait Distance (Feet): 150 Feet Assistive device: Rolling walker Gait Gait: Yes Gait Pattern: Impaired Gait Pattern: Step-through pattern;Decreased step length - right;Decreased stride length;Decreased stance time - right Gait velocity: decreased Stairs / Additional Locomotion Stairs: Yes Stairs Assistance: Supervision/Verbal cueing Stair Management Technique: With walker Number of Stairs: 2 Wheelchair Mobility Wheelchair Mobility: Yes Wheelchair Assistance: Independent with Camera operator: Both upper extremities Wheelchair Parts Management: Independent Distance: 269f  Trunk/Postural Assessment  Cervical Assessment Cervical Assessment: Within Functional Limits Thoracic Assessment Thoracic Assessment: Within Functional Limits Lumbar Assessment Lumbar Assessment: Exceptions to WFL(LSO and spinal precautions) Postural Control Postural Control: Within Functional Limits  Balance Balance Balance Assessed: Yes Static Sitting Balance Static Sitting - Balance Support: No upper extremity supported Static Sitting - Level of Assistance: 6: Modified independent (Device/Increase time) Dynamic Sitting Balance Dynamic Sitting - Balance Support: No upper extremity supported Dynamic Sitting - Level of Assistance: 6: Modified independent (Device/Increase time) Static Standing  Balance Static Standing - Balance Support: Bilateral upper extremity supported Static Standing - Level of Assistance: 6: Modified independent (Device/Increase time) Dynamic Standing Balance Dynamic Standing - Balance Support: Bilateral upper extremity supported Dynamic Standing - Level of Assistance: 5: Stand by assistance Extremity Assessment      RLE Assessment RLE Assessment: Exceptions to [facility] Strength Comments: knee flexion 3-/5, knee extension 2+/5, hip flexion 2+/5, hip extension 4-/5, hip abd 3/5, hip adduction 3/5 LLE Assessment LLE Assessment:  Within Functional Limits   See Function Navigator for Current Functional Status.  Rosita DeChalus 03/06/2018, 4:28 PM

## 2018-03-06 NOTE — Progress Notes (Signed)
Physical Therapy Session Note  Patient Details  Name: Ashley [name] MRN: [id number] Date of Birth: Mar 19, 1976  Today's Date: 03/06/2018 PT Individual Time: 1005-1100 and 1445-1530  PT Individual Time Calculation (min): 55 min and 45 min  Short Term Goals: Week 2:  PT Short Term Goal 1 (Week 2): SGT =LTG due to ELOS   Skilled Therapeutic Interventions/Progress Updates: Pt presented in w/c agreeable to therapy. Session focused on functional activities in preparation for d/c. Pt performed car transfer, bed mobility, platform step x 2. All activities performed at supervision to mod I level. Pt transferred to mat and participated in supine AA therex including heel slides, hip abd/add, ball squeeze in hooklying, and hip ER in hooklying. Pt instructed in HEP and to perform to fatigue to maintain quality. Pt verbalized understanding with sister observing session and verbalizing understanding as well. Pt returned to w/c via squat pivot and propelled back to room. Pt remained in w/c at end of session and remained in w/c with call bell within reach and needs met.   Tx2: Pt presented in w/c agreeable to therapy. Session focused on completion of HEP and LE strengthening. Pt propelled to day room at supervision level and participated in NuStep L3 x 8 min with BLE only. Pt returned to w/c and propelled to rehab gym. Transferred to mat and participated in seated HS and heel cord stretch x 1 min x 3. Pt required min cues for technique and to maximize efficient stretch. PTA printed and reviewed HEP with pt. Pt indicated no concerns or additional questions in preparation for d/c. Pt ambulated back to room 176f with RW and transferred to w/c in room. Pt remained in w/c with call bell within reach and needs met.      Therapy Documentation Precautions:  Precautions Precautions: Back, Fall Precaution Comments: pt able to recall 3/3 back precautions Restrictions Weight Bearing Restrictions: No General:   Vital  Signs: Therapy Vitals Temp: 99.1 F (37.3 C) Temp Source: Oral Pulse Rate: 86 Resp: 19 BP: 118/82 Patient Position (if appropriate): Sitting Oxygen Therapy SpO2: 99 % O2 Device: Room Air Pain:     See Function Navigator for Current Functional Status.   Therapy/Group: Individual Therapy  Adilson Grafton  Kebrina Friend, PTA  03/06/2018, 4:06 PM

## 2018-03-06 NOTE — Progress Notes (Signed)
[place] PHYSICAL MEDICINE & REHABILITATION     PROGRESS NOTE  Subjective/Complaints:  Pt up in w/c. Tearful due to pain in right hip  ROS: Patient denies fever, rash, sore throat, blurred vision, nausea, vomiting, diarrhea, cough, shortness of breath or chest pain, joint or back pain, headache, or mood change.   Objective: Vital Signs: Blood pressure 128/77, pulse 82, temperature 98.5 F (36.9 C), temperature source Oral, resp. rate 18, height 5\' 11"  (1.803 m), weight 118.8 kg (262 lb), SpO2 97 %. No results found. No results for input(s): WBC, HGB, HCT, PLT in the last 72 hours. No results for input(s): NA, K, CL, GLUCOSE, BUN, CREATININE, CALCIUM in the last 72 hours.  Invalid input(s): CO CBG (last 3)  No results for input(s): GLUCAP in the last 72 hours.  Wt Readings from Last 3 Encounters:  02/21/18 118.8 kg (262 lb)  02/21/18 118.8 kg (262 lb)  02/08/18 119.2 kg (262 lb 12.8 oz)    Physical Exam:  BP 128/77 (BP Location: Left Arm)   Pulse 82   Temp 98.5 F (36.9 C) (Oral)   Resp 18   Ht 5\' 11"  (1.803 m)   Wt 118.8 kg (262 lb)   SpO2 97%   BMI 36.54 kg/m  Constitutional: No distress . Vital signs reviewed. HEENT: EOMI, oral membranes moist Neck: supple Cardiovascular: RRR without murmur. No JVD    Respiratory: CTA Bilaterally without wheezes or rales. Normal effort    GI: BS +, non-tender, non-distended  Musc: proximal right lower extremity tenderness Neurological: She isalert.  Motor: B/l UE, LLE 5/5 proximal to distal.  RLE: HF 2/5, KE 1+/5, ADF 5/5 pain component? Skin:warm and dry.  Psychiatric: flat and tearful..  Assessment/Plan: 1. Functional deficits secondary to lumbar spondylosis with radiculopathy.S/Pdecompression which require 3+ hours per day of interdisciplinary therapy in a comprehensive inpatient rehab setting. Physiatrist is providing close team supervision and 24 hour management of active medical problems listed below. Physiatrist and  rehab team continue to assess barriers to discharge/monitor patient progress toward functional and medical goals.  Function:  Bathing Bathing position   Position: Shower  Bathing parts Body parts bathed by patient: Right arm, Left arm, Chest, Abdomen, Front perineal area, Right upper leg, Left upper leg, Buttocks, Right lower leg, Left lower leg, Back Body parts bathed by helper: Back  Bathing assist Assist Level: Set up, Assistive device Assistive Device Comment: long handled sponge Set up : To obtain items  Upper Body Dressing/Undressing Upper body dressing   What is the patient wearing?: Pull over shirt/dress Bra - Perfomed by patient: Thread/unthread right bra strap, Thread/unthread left bra strap, Hook/unhook bra (pull down sports bra)   Pull over shirt/dress - Perfomed by patient: Thread/unthread right sleeve, Thread/unthread left sleeve, Put head through opening, Pull shirt over trunk          Upper body assist Assist Level: Set up   Set up : To obtain clothing/put away  Lower Body Dressing/Undressing Lower body dressing   What is the patient wearing?: Underwear, Shoes Underwear - Performed by patient: Thread/unthread right underwear leg, Thread/unthread left underwear leg, Pull underwear up/down Underwear - Performed by helper: Pull underwear up/down Pants- Performed by patient: Thread/unthread right pants leg, Thread/unthread left pants leg, Pull pants up/down Pants- Performed by helper: Pull pants up/down   Non-skid slipper socks- Performed by helper: Don/doff right sock, Don/doff left sock Socks - Performed by patient: Don/doff right sock, Don/doff left sock Socks - Performed by helper: Don/doff right  sock Shoes - Performed by patient: Don/doff right shoe, Don/doff left shoe Shoes - Performed by helper: Fasten right          Lower body assist Assist for lower body dressing: Assistive device, Set up Assistive Device Comment: reacher  Set up : To obtain  clothing/put away  Toileting Toileting Toileting activity did not occur: No continent bowel/bladder event Toileting steps completed by patient: Adjust clothing prior to toileting, Performs perineal hygiene, Adjust clothing after toileting Toileting steps completed by helper: Adjust clothing prior to toileting, Performs perineal hygiene, Adjust clothing after toileting(per Starlyn Skeans, NT report) Toileting Assistive Devices: Grab bar or rail  Toileting assist Assist level: Supervision or verbal cues   Transfers Chair/bed transfer   Chair/bed transfer method: Stand pivot Chair/bed transfer assist level: Supervision or verbal cues Chair/bed transfer assistive device: Medical sales representative     Max distance: 100' Assist level: Supervision or verbal cues   Wheelchair   Type: Manual Max wheelchair distance: 18ft  Assist Level: Supervision or verbal cues  Cognition Comprehension Comprehension assist level: Understands complex 90% of the time/cues 10% of the time  Expression Expression assist level: Expresses complex ideas: With no assist  Social Interaction Social Interaction assist level: Interacts appropriately with others - No medications needed.  Problem Solving Problem solving assist level: Solves basic 90% of the time/requires cueing < 10% of the time  Memory Memory assist level: Complete Independence: No helper    Medical Problem List and Plan: 1.Decreased functional mobilitysecondary to lumbar spondylosis with radiculopathy.S/Pdecompression L3-4 and L4-5 with anterior exposure of L5-S1 as well as percutaneous fixation L3 to the sacrum 02/17/2018. Appears to have significant right L3 radiculopathy.Back brace when out of bed  Continue CIR  ELOS 6/18 2. DVT Prophylaxis/Anticoagulation: SCDs d/ced  Lovenox started on 6/6   Vascular study showing chronic, but not acute DVT 3. Pain Management:  Neurontin 60  DC'd on 6/14 due to potential side effects  Baclofen  20 mg 4 times daily, increased to 30 mg 4 times a day on 6/10  Robaxin as needed DC'd on 6/10 due to lack of benefit per patient  OxyContin 10 BID started on 6/4, DC'd on 6/12  Percocet changed to q6 PRN  Lidoderm patch added on 6/10  K Pad ordered on 6/14  Behavioral component to pain as well 4. Mood:Wellbutrin XL 300 mg daily, Zoloft 50 mg daily, Xanax 1 mg daily as needed 5. Neuropsych: This patientiscapable of making decisions on herown behalf. 6. Skin/Wound Care:Routine skin checks 7. Fluids/Electrolytes/Nutrition:Routine in and outs  BMP within normal range on 6/12 8.Acute blood loss anemia.   Hemoglobin 9.9 on 6/10  Labs pending for today?  Continue to monitor 9.Hypertension. Cozaar 50 mg daily, HCTZ 12.5 mg daily.   Relatively controlled 6/17 10.Morbid obesity. BMI 36.54.Follow-up dietary 11.Osteoarthritis. 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 Laxative assistance, increased on 6/5  Improving 13. Urinary retention: Resolved  Urine culture with insignificant growth  LOS (Days) 13 A FACE TO FACE EVALUATION WAS PERFORMED  Meredith Staggers 03/06/2018 9:03 AM

## 2018-03-06 NOTE — Progress Notes (Signed)
Social Work  Discharge Note  The overall goal for the admission was met for:   Discharge location: [place]  Length of Stay: Yes-14 DAYS  Discharge activity level: Yes-SUPERVISION LEVEL  Home/community participation: Yes  Services provided included: MD, RD, PT, OT, RN, CM, Pharmacy and SW  Financial Services: Private Insurance: [place]  Follow-up services arranged: Home Health: [place], DME: [facility] and Patient/Family has no preference for HH/DME agencies  Comments (or additional information):PT DID WELL AND REACHED SUPERVISION-MOD/I LEVEL GOALS. READY TO GO HOME.  Patient/Family verbalized understanding of follow-up arrangements: Yes  Individual responsible for coordination of the follow-up plan: SELF  Confirmed correct DME delivered: Elease Hashimoto 03/06/2018    Cylie Dor, [place] Rh[place]

## 2018-03-06 NOTE — Progress Notes (Signed)
Occupational Therapy Session Note  Patient Details  Name: Ashley [name] MRN: [id number] Date of Birth: May 24, 1976  Today's Date: 03/06/2018 OT Individual Time: 6144-3154 and [id number] OT Individual Time Calculation (min): 55 min and 40 min   Short Term Goals: Week 2:  OT Short Term Goal 1 (Week 2): STG = LTGs due to remaining LOS  Skilled Therapeutic Interventions/Progress Updates:    1) Completed ADL retraining at overall supervision/setup with use of AE to complete LB bathing and dressing while adhering to back precautions.  Pt tearful upon arrival with reports of pain in Rt hip and thigh and frustration with perceived lack of progress.  Pt ambulated to room shower with RW with supervision.  Completed bathing at tub bench in shower with lateral leans to wash buttocks and use of long handled sponge to wash lower legs and feet.  Pt completed dressing at sit > stand level from w/c, reports she will most likely complete dressing from toilet at home.  Pt utilized reacher and shoe funnel for donning shoes this session.  Again discussed ordering shoe funnel for home.  Pt reports much improved confidence with mobility and self-care tasks and reports ready for d/c tomorrow.  2) Treatment session with focus on bathroom transfers and mobility at w/c and RW level in home environment.  Pt propelled w/c to ADL apt without assistance. Engaged in toilet and tub/shower transfers ambulating with RW with supervision.  Pt completed transfer on/off 3 in 1 over toilet to provide UE support and transfer into tub/shower with tub transfer bench with RW and supervision.  Engaged in discussion regarding energy conservation during self-care and home making tasks as well as prioritizing tasks and setting personal short term goals.  Pt ambulated short distance in ADL kitchen with RW, discussed use of counters to transport items or from w/c with pt reporting "I'll let my son handle the kitchen".  Pt reports ready for d/c home  with plan for [place] and then transition to [place].  Therapy Documentation Precautions:  Precautions Precautions: Back, Fall Precaution Comments: back precautions reviewed with pt  Restrictions Weight Bearing Restrictions: No General:   Vital Signs: Therapy Vitals Temp: 98.5 F (36.9 C) Temp Source: Oral Pulse Rate: 82 BP: 128/77 Patient Position (if appropriate): Lying Pain: Pain Assessment Pain Scale: 0-10 Pain Score: 7  Pain Type: Acute pain;Surgical pain Pain Location: Back Pain Orientation: Lower Pain Intervention(s): Medication (See eMAR)  See Function Navigator for Current Functional Status.   Therapy/Group: Individual Therapy  [name] 03/06/2018, 9:52 AM

## 2018-03-06 NOTE — Discharge Summary (Signed)
NAME: Ashley [name], Ashley [name] MEDICAL RECORD [id number] ACCOUNT [id number] DATE OF BIRTH:26-May-1976 FACILITY: [facility] LOC[facility]: [facility] PHYSICIAN:ANKIT PATEL, MD  DISCHARGE SUMMARY  DATE OF DISCHARGE:  03/07/2018  DISCHARGE DIAGNOSES: 1.  Lumbar spondylosis with radiculopathy, status post decompression. 2.  Sequential compression devices and Lovenox for deep vein thrombosis prophylaxis, pain management, mood, acute blood loss anemia, hypertension, morbid obesity, constipation, urinary retention, resolved.  HOSPITAL COURSE:  A 42 year old right-handed female with history of hypertension, morbid obesity, bilateral TKA and chronic radicular back pain with back surgery in 2017.  Reported to be independent and sedentary prior to admission.  presented 02/17/2018  with progressive back pain where x-rays, imaging and recent myelogram showed degenerative disk disease, chronic lumbar radiculopathy.  Underwent anterior exposure of L5-S1 as well as decompression L3-4 and 4-5 on 02/17/2018 per Dr. Ellene Route.  Acute blood  loss anemia.  HOSPITAL COURSE:  Pain management.  Back brace when out of bed.  The patient was admitted for comprehensive rehabilitation program.  PAST MEDICAL HISTORY:  See discharge diagnoses.  SOCIAL HISTORY:  Lives with 82-year-old and 31 year old son.  FUNCTIONAL STATUS:  Upon admission to rehabilitation services, minimal assist 10 feet rolling walker +2, safety for sit to stand, mod/max assist with activities of daily living.  PHYSICAL EXAMINATION: VITAL SIGNS:  Blood pressure 128/77, pulse 94, temperature 98, respirations 17. GENERAL:  Alert female in no acute distress.  EOMs intact. NECK:  Supple, nontender, no JVD. CARDIOVASCULAR:  Rate controlled. ABDOMEN:  Soft, nontender, good bowel sounds. LUNGS:  Clear to auscultation without wheeze.  REHABILITATION HOSPITAL COURSE:  The patient was admitted to inpatient rehabilitation services.  Therapies initiated on a 3-hour daily basis,  consisting of physical therapy, occupational therapy and rehabilitation nursing.  The following issues were  addressed during patient's rehabilitation stay:  Pertaining to the patient's lumbar spondylosis radiculopathy, she underwent decompression L3-4 and 4-5 with anterior exposure on 02/17/2018.  She would follow up with neurosurgery.  Back brace when out of  bed.  Venous Doppler studies negative.  She had been on Lovenox for DVT prophylaxis.  Pain management with the use of baclofen as well as oxycodone as needed.  She continued on Wellbutrin and Zoloft for mood stabilization.  Blood pressure is controlled  on present regimen.  Bouts of constipation resolved with laxative assistance.  Morbid obesity, BMI 36.5 with dietary followup.  The patient received weekly collaborative interdisciplinary team conferences to discuss estimated length of stay, family  teaching any barriers to her discharge.  Sessions focused on global strengthening and endurance.  She can propel her wheelchair supervision, self-propel propel wheelchair back towards her room, ambulating 100 feet rolling walker supervision needing some  assistance for lower body activities of daily living and homemaking.  Full family teaching was completed and plan discharge to home.  DISCHARGE MEDICATIONS:  Included baclofen 30 mg p.o. q.i.d., Wellbutrin 300 mg p.o. daily, Colace 100 mg p.o. b.i.d., HCTZ 12.5 mg p.o. daily, Lidoderm patch as directed, Claritin 10 mg p.o. daily, Cozaar 50 mg p.o. daily, Zoloft 100 mg p.o. daily,  oxycodone 1-2 tablets every 6 hours as needed for pain.    Her diet was regular.  She would follow up with Dr. Delice Lesch outpatient rehabilitation service office as directed, Dr. Kristeen Miss call for appointment, [facility] Leric[facility] medical management.  SPECIAL INSTRUCTIONS:  Back brace when out of bed.  TN/NUANCE D:03/06/2018 T:03/06/2018 JOB:[id number]

## 2018-03-07 MED ORDER — SERTRALINE HCL 100 MG PO TABS: 100.0000 mg | ORAL_TABLET | Freq: Every day | ORAL | 0 refills | Status: DC

## 2018-03-07 MED ORDER — BACLOFEN 10 MG PO TABS: 30.0000 mg | ORAL_TABLET | Freq: Four times a day (QID) | ORAL | 0 refills | Status: DC

## 2018-03-07 MED ORDER — OXYCODONE-ACETAMINOPHEN 5-325 MG PO TABS: 1.0000 | ORAL_TABLET | Freq: Four times a day (QID) | ORAL | 0 refills | Status: DC | PRN

## 2018-03-07 MED ORDER — BUPROPION HCL ER (XL) 300 MG PO TB24: 300.0000 mg | ORAL_TABLET | Freq: Every day | ORAL | 0 refills | Status: DC

## 2018-03-07 MED ORDER — LOSARTAN POTASSIUM-HCTZ 50-12.5 MG PO TABS: 1.0000 | ORAL_TABLET | Freq: Every day | ORAL | 3 refills | Status: DC

## 2018-03-07 MED ORDER — ALPRAZOLAM 1 MG PO TABS: 1.0000 mg | ORAL_TABLET | Freq: Every day | ORAL | 0 refills | Status: DC | PRN

## 2018-03-07 MED ORDER — LIDOCAINE 5 % EX PTCH: 2.0000 | MEDICATED_PATCH | CUTANEOUS | 0 refills | Status: DC

## 2018-03-07 NOTE — Progress Notes (Signed)
[place] PHYSICAL MEDICINE & REHABILITATION     PROGRESS NOTE  Subjective/Complaints:  Pt getting dressed. About ready to go.   ROS: Patient denies fever, rash, sore throat, blurred vision, nausea, vomiting, diarrhea, cough, shortness of breath or chest pain,  , headache, or mood change.    Objective: Vital Signs: Blood pressure (!) 142/78, pulse 90, temperature 98.7 F (37.1 C), temperature source Oral, resp. rate 19, height 5\' 11"  (1.803 m), weight 118.8 kg (262 lb), SpO2 94 %. No results found. Recent Labs    03/06/18 0910  WBC 7.1  HGB 10.8*  HCT 35.7*  PLT 297   No results for input(s): NA, K, CL, GLUCOSE, BUN, CREATININE, CALCIUM in the last 72 hours.  Invalid input(s): CO CBG (last 3)  No results for input(s): GLUCAP in the last 72 hours.  Wt Readings from Last 3 Encounters:  02/21/18 118.8 kg (262 lb)  02/21/18 118.8 kg (262 lb)  02/08/18 119.2 kg (262 lb 12.8 oz)    Physical Exam:  BP (!) 142/78   Pulse 90   Temp 98.7 F (37.1 C) (Oral)   Resp 19   Ht 5\' 11"  (1.803 m)   Wt 118.8 kg (262 lb)   SpO2 94%   BMI 36.54 kg/m  Constitutional: No distress . Vital signs reviewed. obese HEENT: EOMI, oral membranes moist Neck: supple Cardiovascular: RRR without murmur. No JVD    Respiratory: CTA Bilaterally without wheezes or rales. Normal effort    GI: BS +, non-tender, non-distended  Musc: proximal right lower extremity tenderness Neurological: She isalert.  Motor: B/l UE, LLE 5/5 proximal to distal.  RLE: HF 2/5, KE 1+/5, ADF 5/5 pain component likely Skin:warm and dry.   Psychiatric: pleasant  Assessment/Plan: 1. Functional deficits secondary to lumbar spondylosis with radiculopathy.S/Pdecompression which require 3+ hours per day of interdisciplinary therapy in a comprehensive inpatient rehab setting. Physiatrist is providing close team supervision and 24 hour management of active medical problems listed below. Physiatrist and rehab team continue to  assess barriers to discharge/monitor patient progress toward functional and medical goals.  Function:  Bathing Bathing position   Position: Shower  Bathing parts Body parts bathed by patient: Right arm, Left arm, Chest, Abdomen, Front perineal area, Right upper leg, Left upper leg, Buttocks, Right lower leg, Left lower leg, Back Body parts bathed by helper: Back  Bathing assist Assist Level: Set up, Assistive device Assistive Device Comment: long handled sponge Set up : To obtain items  Upper Body Dressing/Undressing Upper body dressing   What is the patient wearing?: Pull over shirt/dress, Bra Bra - Perfomed by patient: Thread/unthread right bra strap, Thread/unthread left bra strap, Hook/unhook bra (pull down sports bra)   Pull over shirt/dress - Perfomed by patient: Thread/unthread right sleeve, Thread/unthread left sleeve, Put head through opening, Pull shirt over trunk          Upper body assist Assist Level: More than reasonable time   Set up : To obtain clothing/put away  Lower Body Dressing/Undressing Lower body dressing   What is the patient wearing?: Underwear, Pants, Shoes Underwear - Performed by patient: Thread/unthread right underwear leg, Thread/unthread left underwear leg, Pull underwear up/down Underwear - Performed by helper: Pull underwear up/down Pants- Performed by patient: Thread/unthread right pants leg, Thread/unthread left pants leg, Pull pants up/down Pants- Performed by helper: Pull pants up/down   Non-skid slipper socks- Performed by helper: Don/doff right sock, Don/doff left sock Socks - Performed by patient: Don/doff right sock, Don/doff left sock  Socks - Performed by helper: Don/doff right sock Shoes - Performed by patient: Don/doff right shoe, Don/doff left shoe Shoes - Performed by helper: Fasten right          Lower body assist Assist for lower body dressing: Assistive device, Set up Assistive Device Comment: reacher, shoe funnel Set up :  To obtain clothing/put away  Toileting Toileting Toileting activity did not occur: No continent bowel/bladder event Toileting steps completed by patient: Adjust clothing prior to toileting, Performs perineal hygiene, Adjust clothing after toileting Toileting steps completed by helper: Adjust clothing prior to toileting, Performs perineal hygiene, Adjust clothing after toileting(per Starlyn Skeans, NT report) Toileting Assistive Devices: Grab bar or rail  Toileting assist Assist level: Supervision or verbal cues   Transfers Chair/bed transfer   Chair/bed transfer method: Stand pivot Chair/bed transfer assist level: Supervision or verbal cues Chair/bed transfer assistive device: Medical sales representative     Max distance: 128ft Assist level: Supervision or verbal cues   Wheelchair   Type: Manual Max wheelchair distance: 144ft Assist Level: No help, No cues, assistive device, takes more than reasonable amount of time  Cognition Comprehension Comprehension assist level: Understands complex 90% of the time/cues 10% of the time  Expression Expression assist level: Expresses complex ideas: With no assist  Social Interaction Social Interaction assist level: Interacts appropriately with others - No medications needed.  Problem Solving Problem solving assist level: Solves basic 90% of the time/requires cueing < 10% of the time  Memory Memory assist level: Complete Independence: No helper    Medical Problem List and Plan: 1.Decreased functional mobilitysecondary to lumbar spondylosis with radiculopathy.S/Pdecompression L3-4 and L4-5 with anterior exposure of L5-S1 as well as percutaneous fixation L3 to the sacrum 02/17/2018. Appears to have significant right L3 radiculopathy.Back brace when out of bed  DC home today  Patient to see Rehab MD/provider in the office for transitional care encounter in 1-2 weeks.  2. DVT Prophylaxis/Anticoagulation: SCDs d/ced  Lovenox started on 6/6    Vascular study showing chronic, but not acute DVT 3. Pain Management:  Neurontin 60  DC'd on 6/14 due to potential side effects  Baclofen 20 mg 4 times daily, increased to 30 mg 4 times a day on 6/10  Robaxin as needed DC'd on 6/10 due to lack of benefit per patient  OxyContin 10 BID started on 6/4, DC'd on 6/12  Percocet changed to q6 PRN  Lidoderm patch added on 6/10  K Pad ordered on 6/14  Behavioral component to pain as well  Will need more work as outpt 4. Mood:Wellbutrin XL 300 mg daily, Zoloft 50 mg daily, Xanax 1 mg daily as needed 5. Neuropsych: This patientiscapable of making decisions on herown behalf. 6. Skin/Wound Care:Routine skin checks 7. Fluids/Electrolytes/Nutrition:Routine in and outs  BMP within normal range on 6/12 8.Acute blood loss anemia.   Hemoglobin up to 10.8 on 6/17 9.Hypertension. Cozaar 50 mg daily, HCTZ 12.5 mg daily.   Relatively controlled 6/17 10.Morbid obesity. BMI 36.54.Follow-up dietary 11.Osteoarthritis. 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 Laxative assistance, increased on 6/5  Improving 13. Urinary retention: Resolved  Urine culture with insignificant growth  LOS (Days) 14 A FACE TO FACE EVALUATION WAS PERFORMED  Meredith Staggers 03/07/2018 8:45 AM

## 2018-03-08 ENCOUNTER — Telehealth: Payer: Self-pay

## 2018-03-08 NOTE — Telephone Encounter (Signed)
Transitional Care call  Patient name: Ashley [name]) DOB: (06/03/1976) 1. Are you/is patient experiencing any problems since coming home? (NO) a. Are there any questions regarding any aspect of care? (NO) 2. Are there any questions regarding medications administration/dosing? (YES, NEEDS PRIOR AUTH FOR LIDOCAINE PATCHES) a. Are meds being taken as prescribed? (YES) b. "Patient should review meds with caller to confirm"  3. Have there been any falls? (NO) 4. Has Home Health been to the house and/or have they contacted you? (YES) a. If not, have you tried to contact them? (NA) b. Can we help you contact them? (NA) 5. Are bowels and bladder emptying properly? (NO) a. Are there any unexpected incontinence issues? (NA) b. If applicable, is patient following bowel/bladder programs? (NA) 6. Any fevers, problems with breathing, unexpected pain? (NO) 7. Are there any skin problems or new areas of breakdown? (NO) 8. Has the patient/family member arranged specialty MD follow up (ie cardiology/neurology/renal/surgical/etc.)?  (YES) a. Can we help arrange? (NA) 9. Does the patient need any other services or support that we can help arrange? (NO) 10. Are caregivers following through as expected in assisting the patient? (YES) 11. Has the patient quit smoking, drinking alcohol, or using drugs as recommended? (NA)  Appointment date/time (03-17-18 / 1040am), arrive time (1120am) and who it is with here (Dr. Posey Pronto) [place]

## 2018-03-13 ENCOUNTER — Telehealth: Payer: Self-pay

## 2018-03-13 NOTE — Telephone Encounter (Signed)
Ashley [name], PT/[facility] called requesting order for 1wk1, 2wk1, 1wk3. Verbal orders given per discharge summary.

## 2018-03-13 NOTE — Telephone Encounter (Signed)
[facility] called requesting OT 1wk3 and SW. Verbal orders given per discharge summary

## 2018-03-16 ENCOUNTER — Telehealth: Payer: Self-pay

## 2018-03-16 NOTE — Telephone Encounter (Signed)
Spoke to pt to let her know that we will send info to [employer] and they will be calling her.

## 2018-03-16 NOTE — Telephone Encounter (Signed)
Ashley [name], Pt called asking for verbal order for training pt to do use portal NMES unit quadratic traction. Verbal orders given per Dr. Posey Pronto. Meanwhile pt does need a prescription to get the unit to file insurance.

## 2018-03-16 NOTE — Telephone Encounter (Signed)
We may provide a prescription. Thanks.

## 2018-03-17 ENCOUNTER — Encounter: Payer: Self-pay | Admitting: Physical Medicine & Rehabilitation

## 2018-03-17 ENCOUNTER — Encounter
Payer: BLUE CROSS/BLUE SHIELD | Attending: Physical Medicine & Rehabilitation | Admitting: Physical Medicine & Rehabilitation

## 2018-03-17 VITALS — BP 112/78 | HR 90 | Ht 72.0 in | Wt 255.0 lb

## 2018-03-17 DIAGNOSIS — Z96653 Presence of artificial knee joint, bilateral: Secondary | ICD-10-CM | POA: Insufficient documentation

## 2018-03-17 DIAGNOSIS — M4726 Other spondylosis with radiculopathy, lumbar region: Secondary | ICD-10-CM | POA: Insufficient documentation

## 2018-03-17 DIAGNOSIS — M4326 Fusion of spine, lumbar region: Secondary | ICD-10-CM | POA: Diagnosis not present

## 2018-03-17 DIAGNOSIS — E282 Polycystic ovarian syndrome: Secondary | ICD-10-CM | POA: Insufficient documentation

## 2018-03-17 DIAGNOSIS — F329 Major depressive disorder, single episode, unspecified: Secondary | ICD-10-CM | POA: Diagnosis not present

## 2018-03-17 DIAGNOSIS — M792 Neuralgia and neuritis, unspecified: Secondary | ICD-10-CM

## 2018-03-17 DIAGNOSIS — F411 Generalized anxiety disorder: Secondary | ICD-10-CM | POA: Diagnosis not present

## 2018-03-17 DIAGNOSIS — Z96641 Presence of right artificial hip joint: Secondary | ICD-10-CM | POA: Insufficient documentation

## 2018-03-17 DIAGNOSIS — M5416 Radiculopathy, lumbar region: Secondary | ICD-10-CM

## 2018-03-17 DIAGNOSIS — G8918 Other acute postprocedural pain: Secondary | ICD-10-CM

## 2018-03-17 DIAGNOSIS — Z6834 Body mass index (BMI) 34.0-34.9, adult: Secondary | ICD-10-CM | POA: Diagnosis not present

## 2018-03-17 DIAGNOSIS — I10 Essential (primary) hypertension: Secondary | ICD-10-CM | POA: Diagnosis not present

## 2018-03-17 DIAGNOSIS — R269 Unspecified abnormalities of gait and mobility: Secondary | ICD-10-CM | POA: Diagnosis not present

## 2018-03-17 MED ORDER — OXYCODONE-ACETAMINOPHEN 10-325 MG PO TABS: 1.0000 | ORAL_TABLET | Freq: Two times a day (BID) | ORAL | 0 refills | Status: AC | PRN

## 2018-03-17 MED ORDER — PREGABALIN 75 MG PO CAPS: 75.0000 mg | ORAL_CAPSULE | Freq: Three times a day (TID) | ORAL | 1 refills | Status: DC

## 2018-03-17 NOTE — Progress Notes (Signed)
UDS cancelled by provider, stated will not be prescribing pain medication

## 2018-03-17 NOTE — Progress Notes (Signed)
Subjective:    Patient ID: Ashley [name], female    DOB: Jun 06, 1976, 42 y.o.   MRN: [id number]  HPI 42 year old right-handed female with history of hypertension, morbid obesity, bilateral TKA and chronic radicular back pain presents for transitional care management after receiving CIR for lumbar spondylosis with radiculopathy s/p decompression L3-4 and L4-5 with anterior exposure of L5-S1 as well as percutaneous fixation L3 to the sacrum 02/17/2018 and resulting right L3 radiculopathy.    DATE OF ADMISSION: 03/03/2018 DATE OF DISCHARGE: 03/07/2018   At discharge, she was instructed to follow up with Dr. Ellene Route, with whom she has an appointment next month. She sees PCP next week. She is wearing her back brace. She is taking Percocet 2 tabs BID and obtained Lidoderm patch yesterday. BP is controlled. Denies falls.  She went to Ortho for knee pain yesterday, but was told to follow up with Dr. Ellene Route.   Therapies: 2/week Mobility: Walker at [employer] in community DME: Previously possessed    Pain Inventory Average Pain 4 Pain Right Now 8 My pain is sharp, stabbing and aching  In the last 24 hours, has pain interfered with the following? General activity na Relation with others na Enjoyment of life na What TIME of day is your pain at its worst? na Sleep (in general) Fair  Pain is worse with: walking and some activites Pain improves with: heat/ice and medication Relief from Meds: 4  Mobility use a walker ability to climb steps?  no do you drive?  no  Function disabled: date disabled .  Neuro/Psych numbness trouble walking spasms  Prior Studies Any changes since last visit?  no  Physicians involved in your care Any changes since last visit?  no   Family History  Problem Relation Age of Onset  . Cancer Maternal Grandmother        UTERINE  . Cancer Paternal Grandmother        breast  . Diabetes Father   . Cancer Father        COLON  . Heart disease Father    . Kidney disease Father        dialysis  . Diabetes Sister    Social History   Socioeconomic History  . Marital status: Legally Separated    Spouse name: Not on file  . Number of children: Not on file  . Years of education: Not on file  . Highest education level: Not on file  Occupational History  . Not on file  Social Needs  . Financial resource strain: Not on file  . Food insecurity:    Worry: Not on file    Inability: Not on file  . Transportation needs:    Medical: Not on file    Non-medical: Not on file  Tobacco Use  . Smoking status: Never Smoker  . Smokeless tobacco: Never Used  Substance and Sexual Activity  . Alcohol use: No    Alcohol/week: 0.0 oz  . Drug use: No  . Sexual activity: Not on file  Lifestyle  . Physical activity:    Days per week: Not on file    Minutes per session: Not on file  . Stress: Not on file  Relationships  . Social connections:    Talks on phone: Not on file    Gets together: Not on file    Attends religious service: Not on file    Active member of club or organization: Not on file    Attends meetings of clubs or  organizations: Not on file    Relationship status: Not on file  Other Topics Concern  . Not on file  Social History Narrative  . Not on file   Past Surgical History:  Procedure Laterality Date  . ABDOMINAL EXPOSURE N/A 02/17/2018   Procedure: ABDOMINAL EXPOSURE;  Surgeon: Rosetta Posner, MD;  Location: [facility] OR;  Service: Vascular;  Laterality: N/A;  . ANKLE SURGERY     x 2  . ANTERIOR LAT LUMBAR FUSION N/A 02/17/2018   Procedure: Lumbar three-four Lumbar four-five  Anterolateral lumbar interbody fusion;  Surgeon: Kristeen Miss, MD;  Location: [place];  Service: Neurosurgery;  Laterality: N/A;  . ANTERIOR LUMBAR FUSION N/A 02/17/2018   Procedure: Lumbar five-Sacral one Anterior lumbar interbody fusion;  Surgeon: Kristeen Miss, MD;  Location: [place];  Service: Neurosurgery;  Laterality: N/A;  . APPLICATION OF ROBOTIC  ASSISTANCE FOR SPINAL PROCEDURE N/A 02/17/2018   Procedure: APPLICATION OF ROBOTIC ASSISTANCE FOR SPINAL PROCEDURE;  Surgeon: Kristeen Miss, MD;  Location: [place];  Service: Neurosurgery;  Laterality: N/A;  . BACK SURGERY     2017  discectomy  . CARPAL TUNNEL RELEASE Left 07/04/2014   Procedure: LEFT CARPAL TUNNEL RELEASE;  Surgeon: Ninetta Lights, MD;  Location: [place];  Service: Orthopedics;  Laterality: Left;  . CARPAL TUNNEL RELEASE Right 08/08/2014   Procedure: RIGHT CARPAL TUNNEL RELEASE;  Surgeon: Ninetta Lights, MD;  Location: [place];  Service: Orthopedics;  Laterality: Right;  . DILATION AND CURETTAGE OF UTERUS    . DILATION AND EVACUATION  04/02/2011   Procedure: DILATATION AND EVACUATION (D&E);  Surgeon: Luz Lex, MD;  Location: [facility];  Service: Gynecology;  Laterality: N/A;  dvt left mid thigh  . DORSAL COMPARTMENT RELEASE Left 07/04/2014   Procedure: LEFT DEQUERVAINS;  Surgeon: Ninetta Lights, MD;  Location: [place];  Service: Orthopedics;  Laterality: Left;  . ENDOSCOPIC PLANTAR FASCIOTOMY Left 02/01/2002  . HYSTEROSCOPY W/D&C  07/17/2010   with exc. endometrial polyps  . KNEE ARTHROSCOPY Right 03/07/2003; 01/14/2005; 08/31/2006  . KNEE ARTHROSCOPY Left 03/21/2003; 11/26/2004  . LUMBAR PERCUTANEOUS PEDICLE SCREW 3 LEVEL N/A 02/17/2018   Procedure: LUMBAR PERCUTANEOUS PEDICLE SCREW PLACEMENT LUMBAR THREE-SACRAL ONE;  Surgeon: Kristeen Miss, MD;  Location: [place];  Service: Neurosurgery;  Laterality: N/A;  . SHOULDER ARTHROSCOPY Right   . TOE SURGERY Left 01/10/2003   claw toe correction 2nd, 3rd, 4th toes  . TOTAL HIP ARTHROPLASTY Right 04/21/2016  . TOTAL HIP ARTHROPLASTY Right 04/21/2016   Procedure: TOTAL HIP ARTHROPLASTY ANTERIOR APPROACH;  Surgeon: Ninetta Lights, MD;  Location: [place];  Service: Orthopedics;  Laterality: Right;  . TOTAL KNEE ARTHROPLASTY Left 01/21/2010  . TOTAL KNEE ARTHROPLASTY Right 08/26/2008   Past  Medical History:  Diagnosis Date  . Arthritis    knees, back  . 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 Complication of anesthesia   . Depression   . History of pneumonia   . Hypertension   . Lumbar spondylosis   . PCOS (polycystic ovarian syndrome)   . Pneumonia    2016  . PONV (postoperative nausea and vomiting)    BP 112/78   Pulse 90   Ht 6' (1.829 m)   Wt 255 lb (115.7 kg)   LMP 03/07/2018   SpO2 98%   BMI 34.58 kg/m   Opioid Risk Score:   Fall Risk Score:  `1  Depression screen PHQ 2/9  Depression screen PHQ  2/9 07/13/2016 11/30/2015 08/17/2015 06/25/2015 06/01/2015 04/10/2015 03/30/2015  Decreased Interest 2 0 0 0 0 0 0  Down, Depressed, Hopeless 3 0 0 0 0 0 0  PHQ - 2 Score 5 0 0 0 0 0 0     Review of Systems  Constitutional: Negative.   HENT: Negative.   Eyes: Negative.   Respiratory: Negative.   Cardiovascular: Negative.   Gastrointestinal: Negative.   Endocrine: Negative.   Genitourinary: Negative.   Musculoskeletal: Positive for arthralgias, back pain, gait problem and myalgias.  Skin: Negative.   Allergic/Immunologic: Negative.   Neurological: Positive for numbness.  Hematological: Negative.   Psychiatric/Behavioral: Negative.   All other systems reviewed and are negative.     Objective:   Physical Exam Constitutional: No distress . Obese HENT: Normocephalic, atraumatic Eyes: EOMI, No discharge Cardiovascular: RRR. No JVD    Respiratory: CTA Bilaterally. Normal effort    GI: BS +, non-distended  Musc: no edema, no tenderness Neurological: She is alert.  LLE: 4+/5 proximal to distal RLE: HF 2-/5, KE 2-/5, ADF 5/5 (pain inhibition) Sensation diminished to light touch right thigh.  Skin: warm and dry. Psychiatric: Flat     Assessment & Plan:  42 year old right-handed female with history of hypertension, morbid obesity, bilateral TKA and chronic radicular back pain presents for transitional care management after receiving CIR for  lumbar spondylosis with radiculopathy s/p decompression L3-4 and L4-5 with anterior exposure of L5-S1 as well as percutaneous fixation L3 to the sacrum 02/17/2018 and resulting right L3 radiculopathy.    1. Decreased functional mobility secondary to lumbar spondylosis with radiculopathy.S/P decompression L3-4 and L4-5 with anterior exposure of L5-S1 as well as percutaneous fixation L3 to the sacrum 02/17/2018. Significant right L3 radiculopathy.    Cont therapies  Follow up with Surgery  2. Pain Management:   Unable to tolerate Gabapentin at high doses  Will order Lyrica 75 TID.  Some benefit with Baclofen 30 mg TID, states it is not as effective as it was  Cont Lidoderm patch  Cont heating pad for limited time (only using occasionally)  Will refill 2 week supply of Percocet 10 BID until follow up with Dr. Ellene Route, who may decide further management.  Will not refill, especially given benzos  Cont follow up Psychology - [facility]  She is not interested in [facility]/EMG  3. Mood  Cont follow up with Psychiatry  4. Morbid obesity.   States she knows what she needs to do and has lost weight in the past  Pt would like to restart appetite suppressant, encouraged follow up with Ob/Gyn  5. Gait abnormality  Cont walker/wheelchair for safety  Cont therapies  Meds reviewed Referrals reviewed All questions answered

## 2018-03-27 ENCOUNTER — Telehealth: Payer: Self-pay | Admitting: *Deleted

## 2018-03-27 NOTE — Telephone Encounter (Signed)
We may order one.  Thanks.

## 2018-03-27 NOTE — Telephone Encounter (Signed)
[facility] called stating that a TENS unit was ordered for [facility], but they need a Neurostim unit for PT to increase activation of lower extremity.

## 2018-03-28 NOTE — Telephone Encounter (Signed)
Contacted [facility], awaiting phone call back.  Contacted physical therapist and reorted that I was working on the issue.  Physical Therapist reports patient had a fall.  Checked out at urgent care (not documented on MR). Patient hit head. CT scan ordered, apparently.  Back was okay. Waiting for phone call back from [facility]. Order form indicates TENS/.NMES combo.

## 2018-03-28 NOTE — Telephone Encounter (Signed)
Thank you.  That sounds like that is exactly what she needs, so I am not sure what the issue is.  I agree, unfortunately, I do not see anything in chart regarding the work up.  Thank you for follow up.

## 2018-03-28 NOTE — Telephone Encounter (Addendum)
[facility] called back admitting fault, They are expediting the combo TENS/NMES unit and will accept the return of the TENS unit. Contacted Physical therapist and advised her as well

## 2018-03-30 ENCOUNTER — Telehealth: Payer: Self-pay

## 2018-03-30 DIAGNOSIS — Z96653 Presence of artificial knee joint, bilateral: Secondary | ICD-10-CM | POA: Diagnosis not present

## 2018-03-30 DIAGNOSIS — F329 Major depressive disorder, single episode, unspecified: Secondary | ICD-10-CM | POA: Diagnosis not present

## 2018-03-30 DIAGNOSIS — Z4789 Encounter for other orthopedic aftercare: Secondary | ICD-10-CM | POA: Diagnosis not present

## 2018-03-30 DIAGNOSIS — Z9889 Other specified postprocedural states: Secondary | ICD-10-CM | POA: Diagnosis not present

## 2018-03-30 DIAGNOSIS — M4726 Other spondylosis with radiculopathy, lumbar region: Secondary | ICD-10-CM | POA: Diagnosis not present

## 2018-03-30 DIAGNOSIS — I10 Essential (primary) hypertension: Secondary | ICD-10-CM | POA: Diagnosis not present

## 2018-03-30 DIAGNOSIS — Z6836 Body mass index (BMI) 36.0-36.9, adult: Secondary | ICD-10-CM | POA: Diagnosis not present

## 2018-03-30 DIAGNOSIS — Z96641 Presence of right artificial hip joint: Secondary | ICD-10-CM | POA: Diagnosis not present

## 2018-03-30 NOTE — Telephone Encounter (Signed)
Patient called stating that her lyrica was needing a prior auth to be completed for it.  A prior Josem Kaufmann was conducted and competed 2 days ago as according to [place].  Called the pharmacy back and they confirmed that the PA was completed and the medication will be ready for pick up tomorrow afternoon.

## 2018-04-04 ENCOUNTER — Other Ambulatory Visit: Payer: Self-pay | Admitting: Neurological Surgery

## 2018-04-04 DIAGNOSIS — M47816 Spondylosis without myelopathy or radiculopathy, lumbar region: Secondary | ICD-10-CM

## 2018-04-05 ENCOUNTER — Telehealth: Payer: Self-pay

## 2018-04-05 NOTE — Telephone Encounter (Signed)
Pt called requesting for another place local to get the Neuromusclar Stimulator device because the device she received from [facility] stopped working after one use. Called pt back to let her know unfortunately we do not have another place to request device from, [employer] is worker's comp for this device. In conversation pt is asking for a order to be written for the device and she will contact her insurance company to see if it will be paid for again and where it is covered.

## 2018-04-06 ENCOUNTER — Telehealth: Payer: Self-pay

## 2018-04-06 DIAGNOSIS — R269 Unspecified abnormalities of gait and mobility: Secondary | ICD-10-CM

## 2018-04-06 DIAGNOSIS — M5416 Radiculopathy, lumbar region: Secondary | ICD-10-CM

## 2018-04-06 NOTE — Telephone Encounter (Signed)
[facility] called, stated that the patient has graduated from Home Health-PT and feels much better and would like her to be transferred to OutPatient-PT.  Patient has requested to be sent to [place].

## 2018-04-09 NOTE — Telephone Encounter (Signed)
We can make the referral.  Thanks.

## 2018-04-09 NOTE — Telephone Encounter (Signed)
We can give her another script, however, if her original device, indeed, stopped working in 1 day, it would be prudent to follow up with the supplier as another one will surely be provided. Thanks.

## 2018-04-10 NOTE — Telephone Encounter (Signed)
Order placed for [facility]

## 2018-04-10 NOTE — Addendum Note (Signed)
Addended by: Jasmine December T on: 04/10/2018 09:44 AM   Modules accepted: Orders

## 2018-04-10 NOTE — Telephone Encounter (Signed)
Needs referral. Routing to [employer]

## 2018-04-10 NOTE — Telephone Encounter (Signed)
Pt notified. She states she did send the device back to the supplier. I told her to keep [place] up to date and if we need to send another script somewhere else, let us know.

## 2018-04-13 ENCOUNTER — Ambulatory Visit
Admission: RE | Admit: 2018-04-13 | Discharge: 2018-04-13 | Disposition: A | Discharge status: Home / Self Care | Payer: BLUE CROSS/BLUE SHIELD | Source: Ambulatory Visit | Attending: Neurological Surgery | Admitting: Neurological Surgery

## 2018-04-13 DIAGNOSIS — M47816 Spondylosis without myelopathy or radiculopathy, lumbar region: Secondary | ICD-10-CM

## 2018-04-14 ENCOUNTER — Encounter: Payer: Self-pay | Admitting: Rehabilitation

## 2018-04-14 ENCOUNTER — Ambulatory Visit: Payer: BLUE CROSS/BLUE SHIELD | Attending: Physical Medicine & Rehabilitation | Admitting: Rehabilitation

## 2018-04-14 DIAGNOSIS — M5416 Radiculopathy, lumbar region: Secondary | ICD-10-CM

## 2018-04-14 DIAGNOSIS — G8929 Other chronic pain: Secondary | ICD-10-CM | POA: Diagnosis present

## 2018-04-14 DIAGNOSIS — M545 Low back pain: Secondary | ICD-10-CM | POA: Insufficient documentation

## 2018-04-14 DIAGNOSIS — R2681 Unsteadiness on feet: Secondary | ICD-10-CM | POA: Insufficient documentation

## 2018-04-14 DIAGNOSIS — M6281 Muscle weakness (generalized): Secondary | ICD-10-CM | POA: Diagnosis not present

## 2018-04-14 NOTE — Therapy (Signed)
[facility] [address] [place], [place], [address] Phone: [phone]   Fax:  [phone]  Physical Therapy Evaluation  Patient Details  Name: Ashley [name] MRN: [id number] Date of Birth: 09-18-1976 Referring Provider: Jamse Arn, MD   Encounter Date: 04/14/2018  PT End of Session - 04/14/18 1301    Visit Number  1    Number of Visits  17    Date for PT Re-Evaluation  57/32/20 cert for 90 days, POC for 60 days    Authorization Type  [employer] (has used 7/30 visits)     Authorization - Visit Number  1    Authorization - Number of Visits  23    PT Start Time  1019    PT Stop Time  1103    PT Time Calculation (min)  44 min    Activity Tolerance  Patient tolerated treatment well    Behavior During Therapy  [facility] for tasks assessed/performed       Past Medical History:  Diagnosis Date  . Arthritis    knees, back  . 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 Complication of anesthesia   . Depression   . History of pneumonia   . Hypertension   . Lumbar spondylosis   . PCOS (polycystic ovarian syndrome)   . Pneumonia    2016  . PONV (postoperative nausea and vomiting)     Past Surgical History:  Procedure Laterality Date  . ABDOMINAL EXPOSURE N/A 02/17/2018   Procedure: ABDOMINAL EXPOSURE;  Surgeon: Rosetta Posner, MD;  Location: [facility] OR;  Service: Vascular;  Laterality: N/A;  . ANKLE SURGERY     x 2  . ANTERIOR LAT LUMBAR FUSION N/A 02/17/2018   Procedure: Lumbar three-four Lumbar four-five  Anterolateral lumbar interbody fusion;  Surgeon: Kristeen Miss, MD;  Location: [place];  Service: Neurosurgery;  Laterality: N/A;  . ANTERIOR LUMBAR FUSION N/A 02/17/2018   Procedure: Lumbar five-Sacral one Anterior lumbar interbody fusion;  Surgeon: Kristeen Miss, MD;  Location: [place];  Service: Neurosurgery;  Laterality: N/A;  . APPLICATION OF ROBOTIC ASSISTANCE FOR SPINAL PROCEDURE N/A 02/17/2018   Procedure:  APPLICATION OF ROBOTIC ASSISTANCE FOR SPINAL PROCEDURE;  Surgeon: Kristeen Miss, MD;  Location: [place];  Service: Neurosurgery;  Laterality: N/A;  . BACK SURGERY     2017  discectomy  . CARPAL TUNNEL RELEASE Left 07/04/2014   Procedure: LEFT CARPAL TUNNEL RELEASE;  Surgeon: Ninetta Lights, MD;  Location: [place];  Service: Orthopedics;  Laterality: Left;  . CARPAL TUNNEL RELEASE Right 08/08/2014   Procedure: RIGHT CARPAL TUNNEL RELEASE;  Surgeon: Ninetta Lights, MD;  Location: [place];  Service: Orthopedics;  Laterality: Right;  . DILATION AND CURETTAGE OF UTERUS    . DILATION AND EVACUATION  04/02/2011   Procedure: DILATATION AND EVACUATION (D&E);  Surgeon: Luz Lex, MD;  Location: [facility];  Service: Gynecology;  Laterality: N/A;  dvt left mid thigh  . DORSAL COMPARTMENT RELEASE Left 07/04/2014   Procedure: LEFT DEQUERVAINS;  Surgeon: Ninetta Lights, MD;  Location: [facility];  Service: Orthopedics;  Laterality: Left;  . ENDOSCOPIC PLANTAR FASCIOTOMY Left 02/01/2002  . HYSTEROSCOPY W/D&C  07/17/2010   with exc. endometrial polyps  . KNEE ARTHROSCOPY Right 03/07/2003; 01/14/2005; 08/31/2006  . KNEE ARTHROSCOPY Left 03/21/2003; 11/26/2004  . LUMBAR PERCUTANEOUS PEDICLE SCREW 3 LEVEL N/A 02/17/2018   Procedure: LUMBAR PERCUTANEOUS PEDICLE SCREW PLACEMENT LUMBAR THREE-SACRAL ONE;  Surgeon: Kristeen Miss, MD;  Location: [facility];  Service: Neurosurgery;  Laterality: N/A;  . SHOULDER ARTHROSCOPY Right   . TOE SURGERY Left 01/10/2003   claw toe correction 2nd, 3rd, 4th toes  . TOTAL HIP ARTHROPLASTY Right 04/21/2016  . TOTAL HIP ARTHROPLASTY Right 04/21/2016   Procedure: TOTAL HIP ARTHROPLASTY ANTERIOR APPROACH;  Surgeon: Ninetta Lights, MD;  Location: [facility];  Service: Orthopedics;  Laterality: Right;  . TOTAL KNEE ARTHROPLASTY Left 01/21/2010  . TOTAL KNEE ARTHROPLASTY Right 08/26/2008    There were no vitals filed for this visit.   Subjective  Assessment - 04/14/18 1024    Subjective  Pt is s/p lumbar fusion 5/31 (then IP rehab 6/4-6/18).  "I want to be able to walk on my own and lift this leg (right leg)."     Pertinent History  B TKA (2009, 2011) and R THA 2017,     How long can you stand comfortably?  about 5-10 mins    How long can you walk comfortably?  About 5 mins    Patient Stated Goals  Walk without device and strengthen R leg    Currently in Pain?  Yes    Pain Score  4     Pain Location  Back    Pain Orientation  Lower;Right;Left    Pain Descriptors / Indicators  Burning;Aching    Pain Type  Acute pain    Pain Onset  More than a month ago    Pain Frequency  Constant    Aggravating Factors   activity    Pain Relieving Factors  pain patch, heating pad, rest          [facility] PT Assessment - 04/14/18 1028      Assessment   Medical Diagnosis  s/p lumbar decompression/fusion    Referring Provider  Ankit Lorie Phenix, MD    Onset Date/Surgical Date  02/17/18    Prior Therapy  acute, IP rehab, and HHPT      Precautions   Precautions  Fall    Precaution Comments  pt unsure about bending/lifting/twisting but was cleared from lumbar corsett       Balance Screen   Has the patient fallen in the past 6 months  Yes    How many times?  1    Has the patient had a decrease in activity level because of a fear of falling?   No    Is the patient reluctant to leave their home because of a fear of falling?   No      Home Environment   Living Environment  Private residence    Living Arrangements  Children 34 y/o is back home, 33 y/o    Available Help at Discharge  Family;Available 24 hours/day    Type of [place] to enter    Entrance Stairs-Number of Steps  1 then 1     Entrance Stairs-Rails  None    Home Layout  One level    [place] - 2 wheels;Wheelchair - manual;Tub bench;Grab bars - tub/shower      Prior Function   Level of Independence  Independent    Vocation  -- not working     Leisure  taking care of 61 y/o daughter      Sensation   Light Touch  Impaired Detail    Light Touch Impaired Details  Impaired RLE R L4/5 dermatome decreased sensation    Stereognosis  Appears Intact    Proprioception  Appears Intact      Coordination   Gross Motor Movements are Fluid and Coordinated  No    Fine Motor Movements are Fluid and Coordinated  No    Coordination and Movement Description  limited by strength in the RLE      ROM / Strength   AROM / PROM / Strength  Strength      Strength   Overall Strength  Deficits    Overall Strength Comments  Trace hip flex (in seated), if R LE lifted off ground, has 2/5 knee ext, 5/5 knee flex, R ankle DF 5/5, R ankle PF 4/5.  Note that in supine she is able to perform R hip abd 2-/5, R hip add 2/5.  Also able to do heel slide for 2/5 hip flex       Transfers   Transfers  Sit to Stand;Stand to Sit    Sit to Stand  6: Modified independent (Device/Increase time)    Five time sit to stand comments   20.57  secs with BUE support     Stand to Sit  6: Modified independent (Device/Increase time)      Ambulation/Gait   Ambulation/Gait  Yes    Ambulation/Gait Assistance  6: Modified independent (Device/Increase time)    Ambulation Distance (Feet)  115 Feet    Assistive device  Rolling walker    Gait Pattern  Step-through pattern;Decreased hip/knee flexion - right    Ambulation Surface  Level;Indoor    Gait velocity  1.60 ft/sec withRW    Stairs  Yes    Stairs Assistance  4: Min guard    Stair Management Technique  Two rails;Step to pattern;Forwards    Number of Stairs  4    Height of Stairs  6      Standardized Balance Assessment   Standardized Balance Assessment  Berg Balance Test      Berg Balance Test   Sit to Stand  Able to stand  independently using hands    Standing Unsupported  Able to stand 2 minutes with supervision    Sitting with Back Unsupported but Feet Supported on Floor or Stool  Able to sit safely and securely 2  minutes    Stand to Sit  Controls descent by using hands    Transfers  Able to transfer safely, definite need of hands    Standing Unsupported with Eyes Closed  Able to stand 10 seconds with supervision    Standing Ubsupported with Feet Together  Able to place feet together independently and stand for 1 minute with supervision    From Standing, Reach Forward with Outstretched Arm  Can reach forward >12 cm safely (5")    From Standing Position, Pick up Object from Floor  Unable to pick up and needs supervision    From Standing Position, Turn to Look Behind Over each Shoulder  Looks behind from both sides and weight shifts well    Turn 360 Degrees  Needs close supervision or verbal cueing    Standing Unsupported, Alternately Place Feet on Step/Stool  Needs assistance to keep from falling or unable to try    Standing Unsupported, One Foot in Front  Able to take small step independently and hold 30 seconds    Standing on One Leg  Tries to lift leg/unable to hold 3 seconds but remains standing independently    Total Score  34    Berg comment:  < 36 high risk for  falls (close to 100%)                 Objective measurements completed on examination: See above findings.              PT Education - 04/14/18 1300    Education Details  evaluation findings, POC, goals, performing more ADLs in standing at home to work on endurance (chair behind as needed).     Person(s) Educated  Patient    Methods  Explanation    Comprehension  Verbalized understanding       PT Short Term Goals - 04/14/18 1309      PT SHORT TERM GOAL #1   Title  Pt will initiate HEP in order to indicate improved functional mobility and decreased fall risk.  (Target Date: 05/14/18)    Time  4    Period  Weeks    Status  New    Target Date  05/14/18      PT SHORT TERM GOAL #2   Title  Pt will improve gait speed to 2.20 ft/sec w/ LRAD in order to indicate decreased fall risk.     Time  4    Period  Weeks     Status  New      PT SHORT TERM GOAL #3   Title  Pt will perform 5TSS </=17.57 secs with single UE support only in order to indicate decreased fall risk and improved functional strength.      Time  4    Period  Weeks    Status  New      PT SHORT TERM GOAL #4   Title  Pt will improve BERG balance score to >/=38/56 in order to indicate decreased fall risk.     Time  4    Period  Weeks    Status  New      PT SHORT TERM GOAL #5   Title  Pt will negotiate up/down 4 steps with B rails in reciprocal pattern at mod I level in order to indicate improved LE strnegth.     Time  4    Period  Weeks    Status  New        PT Long Term Goals - 04/14/18 1312      PT LONG TERM GOAL #1   Title  Pt will be independent with final HEP in order to indicate improved functional mobilty and decreased fall risk.  (Target Date: 06/13/18)    Time  8    Period  Weeks    Status  New    Target Date  06/13/18      PT LONG TERM GOAL #2   Title  Pt will improve gait speed to >/=2.62 ft/sec w/ LRAD in order to indicate decreased fall risk and safe community ambulator.     Time  8    Period  Weeks    Status  New      PT LONG TERM GOAL #3   Title  Pt will perform 5TSS in </=14.57 secs without UE support in order to indicate decreased fall risk and improved functional strength.     Time  8    Period  Weeks    Status  New      PT LONG TERM GOAL #4   Title  Pt will improve BERG balance score to >/=42/56 in order to indicate decreased fall risk.      Time  8    Period  Weeks  Status  New      PT LONG TERM GOAL #5   Title  Pt will ambulate x 150' indoors w/ SBQC vs quad tip cane at S level in order to indicate improved functional independence.     Time  8    Period  Weeks    Status  New      Additional Long Term Goals   Additional Long Term Goals  Yes      PT LONG TERM GOAL #6   Title  Pt will ambulate x 500' w/ LRAD at mod I level over outdoor paved surfaces in order to indicate improved  community endurance.     Time  8    Period  Weeks    Status  New             Plan - 04/14/18 1302    Clinical Impression Statement  Pt presents s/p decompression L3/4, L4/5 with anterior exposure of L5/S1 and percutaneous fixation of L3-S1 on 02/17/18 with IP rehab stay from 6/4-6/18 followed by HHPT x 7 visits.  Following surgery noted severe R LE weakness in L2 myotome along with hip abd/add weakness with L4/5 dermatome involvement.   Pt having to ambulate with RW and depend on older son for caregiving of her and pts 91 y/o daughter.  Pt seems to have difficulty coping with injury and needing to rely on others for independence.  Upon PT evaluation, note 5TSS of 20.57 secs with BUE support (heavily) indicative of elevated fall risk and decreased functional strength, gait speed of 1.60 ft/sec with RW indicative of high fall risk and BERG balance score of 34/56 indicative of close to 100% risk of falling.  Pt will benefit from skilled OP neuro in order to address deficits.      History and Personal Factors relevant to plan of care:  see above, along with history of HTN, B TKA, R THA, obesity    Clinical Presentation  Evolving    Clinical Presentation due to:  see above    Clinical Decision Making  Moderate    Rehab Potential  Good    Clinical Impairments Affecting Rehab Potential  pt motivated to improve, limited by visit limit    PT Frequency  2x / week    PT Duration  8 weeks    PT Treatment/Interventions  ADLs/Self Care Home Management;Aquatic Therapy;DME Instruction;Gait training;Stair training;Functional mobility training;Therapeutic activities;Therapeutic exercise;Balance training;Neuromuscular re-education;Patient/family education;Electrical Stimulation;Passive range of motion;Energy conservation;Vestibular    PT Next Visit Plan  She may be getting NMES unit for home so set up when able-can we utilize Bioness thigh unit?, initiate HEP for strength and balance/endurance, gait with LRAD as  able, stairs    Consulted and Agree with Plan of Care  Patient       Patient will benefit from skilled therapeutic intervention in order to improve the following deficits and impairments:  Abnormal gait, Decreased activity tolerance, Decreased balance, Decreased coordination, Decreased endurance, Decreased mobility, Decreased range of motion, Decreased strength, Impaired perceived functional ability, Impaired flexibility, Postural dysfunction, Impaired sensation, Impaired tone, Pain(pain to be addressed through therex)  Visit Diagnosis: Muscle weakness (generalized)  Chronic midline low back pain without sciatica  Radiculopathy, lumbar region  Unsteadiness on feet     Problem List Patient Active Problem List   Diagnosis Date Noted  . Generalized anxiety disorder   . Pain   . Fall   . Labile blood pressure   . Neuropathic pain   . Therapeutic opioid  induced constipation   . Urinary retention   . Lumbar radiculopathy 02/21/2018  . PCOS (polycystic ovarian syndrome)   . Morbid obesity ([place])   . Depression   . Generalized OA   . Postoperative pain   . Acute blood loss anemia   . Status post surgery 02/17/2018  . Lumbar radiculopathy, chronic 02/17/2018  . Primary localized osteoarthritis of right hip 04/21/2016  . Sinusitis 06/25/2015  . Cough 06/25/2015  . Essential hypertension 04/03/2015  . Dehydration 04/03/2015  . CAP (community acquired pneumonia) 03/31/2015  . Right lower lobe pneumonia ([place]) 03/31/2015  . Fever 03/31/2015  . Pregnancy 04/29/2014  . Miscarriage   . Incomplete miscarriage 04/02/2011    Class: Acute  . Deep vein thrombosis (DVT) ([place]) 04/02/2011    Class: Chronic    Cameron Sprang, PT, MPT [facility] [address] [place], [place], [address] Phone: [phone]   Fax:  [phone] 04/14/18, 3:59 PM  Name: SARENITY RAMAKER MRN: [id number] Date of Birth: [birth year]

## 2018-04-17 ENCOUNTER — Ambulatory Visit: Payer: BLUE CROSS/BLUE SHIELD | Admitting: Physical Therapy

## 2018-04-26 DIAGNOSIS — G8310 Monoplegia of lower limb affecting unspecified side: Secondary | ICD-10-CM | POA: Insufficient documentation

## 2018-05-02 ENCOUNTER — Other Ambulatory Visit: Payer: Self-pay | Admitting: Neurological Surgery

## 2018-05-05 ENCOUNTER — Encounter

## 2018-05-05 ENCOUNTER — Ambulatory Visit: Payer: BLUE CROSS/BLUE SHIELD | Admitting: Physical Therapy

## 2018-05-06 ENCOUNTER — Encounter: Payer: Self-pay | Admitting: Podiatry

## 2018-05-06 ENCOUNTER — Ambulatory Visit: Payer: BLUE CROSS/BLUE SHIELD | Admitting: Podiatry

## 2018-05-06 DIAGNOSIS — L603 Nail dystrophy: Secondary | ICD-10-CM | POA: Diagnosis not present

## 2018-05-06 NOTE — Progress Notes (Signed)
   HPI: Patient presents today for a longitudinal splitting of her nail with incurvation to the right hallux.  She says that she had some vague injury in 2017 approximately 2 years prior and this is when she started to notice partial detachment with a longitudinal split in her nail.  Is been like this for 2 years.  She has had no treatments.  Incurvated borders are painful.  No alleviating factors.  Pain is constant throughout the day.  Past Medical History:  Diagnosis Date  . Arthritis    knees, back  . 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 Complication of anesthesia   . Depression   . History of pneumonia   . Hypertension   . Lumbar spondylosis   . PCOS (polycystic ovarian syndrome)   . Pneumonia    2016  . PONV (postoperative nausea and vomiting)      Physical Exam: General: The patient is alert and oriented x3 in no acute distress.  Dermatology: Skin is warm, dry and supple bilateral lower extremities. Negative for open lesions or macerations.  Longitudinal split in the right hallux nail plate noted.  There is some pigmentation streaking along the nail plate as well noted from the base of the nail to the distal portion of the nail.  Incurvated nail borders also noted.  Onikul lysis with partial detachment of the distal portion of the nail also noted.  Approximately one third of the nail.  Vascular: Palpable pedal pulses bilaterally. No edema or erythema noted. Capillary refill within normal limits.  Neurological: Epicritic and protective threshold grossly intact bilaterally.   Musculoskeletal Exam: Range of motion within normal limits to all pedal and ankle joints bilateral. Muscle strength 5/5 in all groups bilateral.   Assessment: 1.  Longitudinal split nail with pigmentation secondary to trauma right hallux   Plan of Care:  1. Patient evaluated.  2.  Today I presented the options to have total temporary nail avulsion of the nail regrow to see if it re-attaches to  the nailbed.  Also offered just simple debridement of the nail.  Patient has had multiple surgeries including back surgery and cannot properly lotion and tend to her toenail if nail avulsion was performed.  She opts for conservative mechanical debridement. 3.  Mechanical debridement of the distal one third of the nail that is detached was performed using a nail nipper.  Recommend vitamin E oil daily. 4.  Return to clinic as needed      Edrick Kins, DPM [facility]  Dr. Edrick Kins, DPM    [address], [place] [address]                Office [phone]  Fax [phone]

## 2018-05-06 NOTE — Addendum Note (Signed)
Addended by: Graceann Congress D on: 05/06/2018 10:12 AM   Modules accepted: Orders

## 2018-05-09 ENCOUNTER — Inpatient Hospital Stay (HOSPITAL_COMMUNITY)
Admission: RE | Admit: 2018-05-09 | Discharge: 2018-05-09 | Disposition: A | Discharge status: Home / Self Care | Payer: BLUE CROSS/BLUE SHIELD | Source: Ambulatory Visit

## 2018-05-09 ENCOUNTER — Ambulatory Visit: Payer: BLUE CROSS/BLUE SHIELD | Admitting: Physical Therapy

## 2018-05-11 ENCOUNTER — Ambulatory Visit: Payer: BLUE CROSS/BLUE SHIELD | Admitting: Rehabilitation

## 2018-05-15 ENCOUNTER — Ambulatory Visit: Payer: BLUE CROSS/BLUE SHIELD | Admitting: Rehabilitation

## 2018-05-16 ENCOUNTER — Ambulatory Visit: Payer: BLUE CROSS/BLUE SHIELD | Admitting: Sports Medicine

## 2018-05-18 ENCOUNTER — Ambulatory Visit: Payer: BLUE CROSS/BLUE SHIELD | Admitting: Rehabilitation

## 2018-05-19 ENCOUNTER — Encounter: Payer: Self-pay | Admitting: Physical Medicine & Rehabilitation

## 2018-05-19 ENCOUNTER — Encounter
Payer: BLUE CROSS/BLUE SHIELD | Attending: Physical Medicine & Rehabilitation | Admitting: Physical Medicine & Rehabilitation

## 2018-05-19 VITALS — BP 133/86 | HR 81 | Ht 71.0 in | Wt 259.6 lb

## 2018-05-19 DIAGNOSIS — E282 Polycystic ovarian syndrome: Secondary | ICD-10-CM | POA: Insufficient documentation

## 2018-05-19 DIAGNOSIS — Z96653 Presence of artificial knee joint, bilateral: Secondary | ICD-10-CM | POA: Insufficient documentation

## 2018-05-19 DIAGNOSIS — F329 Major depressive disorder, single episode, unspecified: Secondary | ICD-10-CM | POA: Insufficient documentation

## 2018-05-19 DIAGNOSIS — M4326 Fusion of spine, lumbar region: Secondary | ICD-10-CM | POA: Insufficient documentation

## 2018-05-19 DIAGNOSIS — I10 Essential (primary) hypertension: Secondary | ICD-10-CM | POA: Insufficient documentation

## 2018-05-19 DIAGNOSIS — R269 Unspecified abnormalities of gait and mobility: Secondary | ICD-10-CM | POA: Insufficient documentation

## 2018-05-19 DIAGNOSIS — M4726 Other spondylosis with radiculopathy, lumbar region: Secondary | ICD-10-CM | POA: Insufficient documentation

## 2018-05-19 DIAGNOSIS — G8918 Other acute postprocedural pain: Secondary | ICD-10-CM

## 2018-05-19 DIAGNOSIS — Z96641 Presence of right artificial hip joint: Secondary | ICD-10-CM | POA: Insufficient documentation

## 2018-05-19 DIAGNOSIS — M5416 Radiculopathy, lumbar region: Secondary | ICD-10-CM

## 2018-05-19 DIAGNOSIS — M792 Neuralgia and neuritis, unspecified: Secondary | ICD-10-CM | POA: Diagnosis not present

## 2018-05-19 DIAGNOSIS — Z6834 Body mass index (BMI) 34.0-34.9, adult: Secondary | ICD-10-CM | POA: Diagnosis not present

## 2018-05-19 MED ORDER — PREGABALIN 75 MG PO CAPS: 75.0000 mg | ORAL_CAPSULE | Freq: Three times a day (TID) | ORAL | 1 refills | Status: DC

## 2018-05-19 MED ORDER — BACLOFEN 10 MG PO TABS: 30.0000 mg | ORAL_TABLET | Freq: Three times a day (TID) | ORAL | 1 refills | Status: DC

## 2018-05-19 NOTE — Progress Notes (Signed)
Subjective:    Patient ID: Ashley [name], female    DOB: [birth year], 42 y.o.   MRN: [id number]  HPI 42 year old right-handed femalemale with history of hypertension, morbid obesity, bilateral TKA and chronic radicular back pain presents for follow up for  lumbar spondylosis with radiculopathy s/p decompression L3-4 and L4-5 with anterior exposure of L5-S1 as well as percutaneous fixation L3 to the sacrum 02/17/2018 and resulting right L3 radiculopathy.    Last clinic visit 03/17/18.  Since that time, pt states she had a CT.  She states she was told 1 screw is impinging on her nerve and she is schedule to have surgery again next month. Therapies are on hold for surgery.  She is tolerating Lyrica with benefit.  She notes benefit with Baclofen.  She is ambulating now without falls.    Pain Inventory Average Pain 7 Pain Right Now 5 My pain is stabbing and aching  In the last 24 hours, has pain interfered with the following? General activity na Relation with others na Enjoyment of life 5 What TIME of day is your pain at its worst? na Sleep (in general) Fair  Pain is worse with: walking and some activites Pain improves with: heat/ice and medication Relief from Meds: 6  Mobility use a walker ability to climb steps?  no do you drive?  no  Function disabled: date disabled .  Neuro/Psych trouble walking depression  Prior Studies Any changes since last visit?  no  Physicians involved in your care Any changes since last visit?  no   Family History  Problem Relation Age of Onset  . Cancer Maternal Grandmother        UTERINE  . Cancer Paternal Grandmother        breast  . Diabetes Father   . Cancer Father        COLON  . Heart disease Father   . Kidney disease Father        dialysis  . Diabetes Sister    Social History   Socioeconomic History  . Marital status: Legally Separated    Spouse name: Not on file  . Number of children: Not on file  . Years of education: Not  on file  . Highest education level: Not on file  Occupational History  . Not on file  Social Needs  . Financial resource strain: Not on file  . Food insecurity:    Worry: Not on file    Inability: Not on file  . Transportation needs:    Medical: Not on file    Non-medical: Not on file  Tobacco Use  . Smoking status: Never Smoker  . Smokeless tobacco: Never Used  Substance and Sexual Activity  . Alcohol use: No    Alcohol/week: 0.0 standard drinks  . Drug use: No  . Sexual activity: Not on file  Lifestyle  . Physical activity:    Days per week: Not on file    Minutes per session: Not on file  . Stress: Not on file  Relationships  . Social connections:    Talks on phone: Not on file    Gets together: Not on file    Attends religious service: Not on file    Active member of club or organization: Not on file    Attends meetings of clubs or organizations: Not on file    Relationship status: Not on file  Other Topics Concern  . Not on file  Social History Narrative  . Not on  file   Past Surgical History:  Procedure Laterality Date  . ABDOMINAL EXPOSURE N/A 02/17/2018   Procedure: ABDOMINAL EXPOSURE;  Surgeon: Rosetta Posner, MD;  Location: [facility] OR;  Service: Vascular;  Laterality: N/A;  . ANKLE SURGERY     x 2  . ANTERIOR LAT LUMBAR FUSION N/A 02/17/2018   Procedure: Lumbar three-four Lumbar four-five  Anterolateral lumbar interbody fusion;  Surgeon: Kristeen Miss, MD;  Location: [place];  Service: Neurosurgery;  Laterality: N/A;  . ANTERIOR LUMBAR FUSION N/A 02/17/2018   Procedure: Lumbar five-Sacral one Anterior lumbar interbody fusion;  Surgeon: Kristeen Miss, MD;  Location: [place];  Service: Neurosurgery;  Laterality: N/A;  . APPLICATION OF ROBOTIC ASSISTANCE FOR SPINAL PROCEDURE N/A 02/17/2018   Procedure: APPLICATION OF ROBOTIC ASSISTANCE FOR SPINAL PROCEDURE;  Surgeon: Kristeen Miss, MD;  Location: [place];  Service: Neurosurgery;  Laterality: N/A;  . BACK SURGERY     2017   discectomy  . CARPAL TUNNEL RELEASE Left 07/04/2014   Procedure: LEFT CARPAL TUNNEL RELEASE;  Surgeon: Ninetta Lights, MD;  Location: [place];  Service: Orthopedics;  Laterality: Left;  . CARPAL TUNNEL RELEASE Right 08/08/2014   Procedure: RIGHT CARPAL TUNNEL RELEASE;  Surgeon: Ninetta Lights, MD;  Location: [place];  Service: Orthopedics;  Laterality: Right;  . DILATION AND CURETTAGE OF UTERUS    . DILATION AND EVACUATION  04/02/2011   Procedure: DILATATION AND EVACUATION (D&E);  Surgeon: Luz Lex, MD;  Location: [facility];  Service: Gynecology;  Laterality: N/A;  dvt left mid thigh  . DORSAL COMPARTMENT RELEASE Left 07/04/2014   Procedure: LEFT DEQUERVAINS;  Surgeon: Ninetta Lights, MD;  Location: [place];  Service: Orthopedics;  Laterality: Left;  . ENDOSCOPIC PLANTAR FASCIOTOMY Left 02/01/2002  . HYSTEROSCOPY W/D&C  07/17/2010   with exc. endometrial polyps  . KNEE ARTHROSCOPY Right 03/07/2003; 01/14/2005; 08/31/2006  . KNEE ARTHROSCOPY Left 03/21/2003; 11/26/2004  . LUMBAR PERCUTANEOUS PEDICLE SCREW 3 LEVEL N/A 02/17/2018   Procedure: LUMBAR PERCUTANEOUS PEDICLE SCREW PLACEMENT LUMBAR THREE-SACRAL ONE;  Surgeon: Kristeen Miss, MD;  Location: [place];  Service: Neurosurgery;  Laterality: N/A;  . SHOULDER ARTHROSCOPY Right   . TOE SURGERY Left 01/10/2003   claw toe correction 2nd, 3rd, 4th toes  . TOTAL HIP ARTHROPLASTY Right 04/21/2016  . TOTAL HIP ARTHROPLASTY Right 04/21/2016   Procedure: TOTAL HIP ARTHROPLASTY ANTERIOR APPROACH;  Surgeon: Ninetta Lights, MD;  Location: [place];  Service: Orthopedics;  Laterality: Right;  . TOTAL KNEE ARTHROPLASTY Left 01/21/2010  . TOTAL KNEE ARTHROPLASTY Right 08/26/2008   Past Medical History:  Diagnosis Date  . Arthritis    knees, back  . 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 Complication of anesthesia   . Depression   . History of pneumonia   . Hypertension   . Lumbar spondylosis   .  PCOS (polycystic ovarian syndrome)   . Pneumonia    2016  . PONV (postoperative nausea and vomiting)    BP 133/86   Pulse 81   Ht 5\' 11"  (1.803 m)   Wt 259 lb 9.6 oz (117.8 kg)   LMP 05/07/2018   SpO2 98%   BMI 36.21 kg/m   Opioid Risk Score:   Fall Risk Score:  `1  Depression screen PHQ 2/9  Depression screen [facility] 2/9 07/13/2016 11/30/2015 08/17/2015 06/25/2015 06/01/2015 04/10/2015 03/30/2015  Decreased Interest 2 0 0 0 0 0 0  Down, Depressed, Hopeless 3 0 0 0  0 0 0  PHQ - 2 Score 5 0 0 0 0 0 0     Review of Systems  Constitutional: Negative.   HENT: Negative.   Eyes: Negative.   Respiratory: Negative.   Cardiovascular: Negative.   Gastrointestinal: Negative.   Endocrine: Negative.   Genitourinary: Negative.   Musculoskeletal: Positive for arthralgias, back pain, gait problem, joint swelling and myalgias.  Skin: Negative.   Allergic/Immunologic: Negative.   Neurological: Positive for numbness.  Hematological: Negative.   Psychiatric/Behavioral: Negative.   All other systems reviewed and are negative.     Objective:   Physical Exam Constitutional: No distress . Obese HENT: Normocephalic, atraumatic Eyes: EOMI, No discharge Cardiovascular: RRR. no JVD    Respiratory: CTA bilaterally. Normal effort    GI: BS +, non-distended  Musc: no edema, no tenderness Neurological: She is alert.  LLE: 5/5 proximal to distal RLE: HF 2-/5, KE 4/5, ADF 5/5 (pain inhibition) Sensation diminished to light touch right thigh.  Skin: warm and dry. Psychiatric: Very flat     Assessment & Plan:  42 year old right-handed female with history of hypertension, morbid obesity, bilateral TKA and chronic radicular back pain presents for follow up for  lumbar spondylosis with radiculopathy s/p decompression L3-4 and L4-5 with anterior exposure of L5-S1 as well as percutaneous fixation L3 to the sacrum 02/17/2018 and resulting right L3 radiculopathy.    1. Decreased functional mobility  secondary to lumbar spondylosis with radiculopathy.S/P decompression L3-4 and L4-5 with anterior exposure of L5-S1 as well as percutaneous fixation L3 to the sacrum 02/17/2018. Significant right L3 radiculopathy.    Therapies on hold due to plan for surgery to nerve impingement from screw, next month per patient  2. Pain Management:   Unable to tolerate Gabapentin at high doses  Cont Lyrica 75 TID.  Cont Baclofen 30 mg TID  Cont Lidoderm patch  Will order TENS IT  Cont heating pad for limited time (only using occasionally)  Cont follow up Psychology - [facility]  She is not interested in NCS/EMG at present  3. Mood  Cont follow up with Psychiatry  4. Morbid obesity.   States she knows what she needs to do and has lost weight in the past  She is going to start appetite suppressant  5. Gait abnormality  No assistive device at present  Resume therapies when appropriate

## 2018-05-23 ENCOUNTER — Ambulatory Visit: Payer: BLUE CROSS/BLUE SHIELD | Admitting: Physical Therapy

## 2018-05-25 ENCOUNTER — Ambulatory Visit: Payer: BLUE CROSS/BLUE SHIELD | Admitting: Rehabilitation

## 2018-05-29 ENCOUNTER — Ambulatory Visit: Payer: BLUE CROSS/BLUE SHIELD | Admitting: Rehabilitation

## 2018-05-31 NOTE — Pre-Procedure Instructions (Signed)
WILLISTINE FERRALL  05/31/2018      [id number] [address], [place] [address] Phone: [phone] Fax: [phone]    Your procedure is scheduled on Friday, Sept. 20th   Report to [facility] Admitting at 5:30 AM              (posted surgery time 7:30a - 10:09a)   Call this number if you have problems the morning of surgery:  [phone]   Remember:   Do not eat any foods or drink any liquids after midnight, Thursday.               4-5 days prior to surgery, STOP TAKING ANY Vitamins, Herbal Supplements, Anti-inflammatories.   Take these medicines the morning of surgery with A SIP OF WATER : Xanax, Wellbutrin, Zoloft, Lyrica, Oxycodone.    Do not wear jewelry, make-up or nail polish.  Do not wear lotions, powders,  perfumes, or deodorant.  Do not shave 48 hours prior to surgery.   Do not bring valuables to the hospital.  [facility] is not responsible for any belongings or valuables.  Contacts, dentures or bridgework may not be worn into surgery.  Leave your suitcase in the car.  After surgery it may be brought to your room.  For patients admitted to the hospital, discharge time will be determined by your treatment team.  Please read over the following fact sheets that you were given. Pain Booklet, MRSA Information and Surgical Site Infection Prevention       [place]- Preparing For Surgery  Before surgery, you can play an important role. Because skin is not sterile, your skin needs to be as free of germs as possible. You can reduce the number of germs on your skin by washing with CHG (chlorahexidine gluconate) Soap before surgery.  CHG is an antiseptic cleaner which kills germs and bonds with the skin to continue killing germs even after washing.    Oral Hygiene is also important to reduce your risk of infection.    Remember - BRUSH YOUR TEETH THE MORNING OF SURGERY WITH YOUR REGULAR  TOOTHPASTE  Please do not use if you have an allergy to CHG or antibacterial soaps. If your skin becomes reddened/irritated stop using the CHG.  Do not shave (including legs and underarms) for at least 48 hours prior to first CHG shower. It is OK to shave your face.  Please follow these instructions carefully.   1. Shower the NIGHT BEFORE SURGERY and the MORNING OF SURGERY with CHG.   2. If you chose to wash your hair, wash your hair first as usual with your normal shampoo.  3. After you shampoo, rinse your hair and body thoroughly to remove the shampoo.  4. Use CHG as you would any other liquid soap. You can apply CHG directly to the skin and wash gently with a scrungie or a clean washcloth.   5. Apply the CHG Soap to your body ONLY FROM THE NECK DOWN.  Do not use on open wounds or open sores. Avoid contact with your eyes, ears, mouth and genitals (private parts). Wash Face and genitals (private parts)  with your normal soap.  6. Wash thoroughly, paying special attention to the area where your surgery will be performed.  7. Thoroughly rinse your body with warm water from the neck down.  8. DO NOT shower/wash with your normal soap after using and rinsing off the  CHG Soap.  9. Pat yourself dry with a CLEAN TOWEL.  10. Wear CLEAN PAJAMAS to bed the night before surgery, wear comfortable clothes the morning of surgery  11. Place CLEAN SHEETS on your bed the night of your first shower and DO NOT SLEEP WITH PETS.  Day of Surgery:  Do not apply any deodorants/lotions.  Please wear clean clothes to the hospital/surgery center.    Remember to brush your teeth WITH YOUR REGULAR TOOTHPASTE.

## 2018-06-01 ENCOUNTER — Ambulatory Visit: Payer: BLUE CROSS/BLUE SHIELD | Admitting: Rehabilitation

## 2018-06-01 ENCOUNTER — Encounter (HOSPITAL_COMMUNITY)
Admission: RE | Admit: 2018-06-01 | Discharge: 2018-06-01 | Disposition: A | Discharge status: Home / Self Care | Payer: BLUE CROSS/BLUE SHIELD | Source: Ambulatory Visit | Attending: Neurological Surgery | Admitting: Neurological Surgery

## 2018-06-01 ENCOUNTER — Encounter (HOSPITAL_COMMUNITY): Payer: Self-pay

## 2018-06-01 ENCOUNTER — Other Ambulatory Visit: Payer: Self-pay

## 2018-06-01 DIAGNOSIS — Z01812 Encounter for preprocedural laboratory examination: Secondary | ICD-10-CM | POA: Diagnosis not present

## 2018-06-01 LAB — BASIC METABOLIC PANEL
Anion gap: 5 (ref 5–15)
BUN: 8 mg/dL (ref 6–20)
CO2: 27 mmol/L (ref 22–32)
Calcium: 8.8 mg/dL — ABNORMAL LOW (ref 8.9–10.3)
Chloride: 107 mmol/L (ref 98–111)
Creatinine, Ser: 0.83 mg/dL (ref 0.44–1.00)
GFR calc Af Amer: >60 mL/min (ref >60)
GFR calc non Af Amer: >60 mL/min (ref >60)
Glucose, Bld: 113 mg/dL — ABNORMAL HIGH (ref 70–99)
Potassium: 3.9 mmol/L (ref 3.5–5.1)
Sodium: 139 mmol/L (ref 135–145)

## 2018-06-01 LAB — CBC
HCT: 37.8 % (ref 36.0–46.0)
Hemoglobin: 11.4 g/dL — ABNORMAL LOW (ref 12.0–15.0)
MCH: 23.7 pg — ABNORMAL LOW (ref 26.0–34.0)
MCHC: 30.2 g/dL (ref 30.0–36.0)
MCV: 78.6 fL (ref 78.0–100.0)
Platelets: 237 10*3/uL (ref 150–400)
RBC: 4.81 MIL/uL (ref 3.87–5.11)
RDW: 16.3 % — ABNORMAL HIGH (ref 11.5–15.5)
WBC: 6.2 10*3/uL (ref 4.0–10.5)

## 2018-06-01 LAB — TYPE AND SCREEN
ABO/RH(D): B POS
Antibody Screen: NEGATIVE

## 2018-06-01 LAB — SURGICAL PCR SCREEN
MRSA, PCR: NEGATIVE
Staphylococcus aureus: NEGATIVE

## 2018-06-01 NOTE — Progress Notes (Signed)
PCP is [facility]  LOV 03/2018 Denies any cardiac issues, no murmur, cp, sob.  She did say that she had 4 yrs ago a blood clot behind her left knee.  Was on blood thinners then, but has been off those for yrs.

## 2018-06-05 ENCOUNTER — Ambulatory Visit: Payer: BLUE CROSS/BLUE SHIELD | Admitting: Rehabilitation

## 2018-06-08 NOTE — Anesthesia Preprocedure Evaluation (Addendum)
Anesthesia Evaluation  Patient identified by MRN, date of birth, ID band Patient awake    Reviewed: Allergy & Precautions, NPO status , Patient's Chart, lab work & pertinent test results  History of Anesthesia Complications (+) PONV and history of anesthetic complications  Airway Mallampati: III  TM Distance: >3 FB Neck ROM: Full    Dental  (+) Teeth Intact, Dental Advisory Given   Pulmonary neg pulmonary ROS,    breath sounds clear to auscultation       Cardiovascular hypertension, Pt. on medications  Rhythm:Regular Rate:Normal     Neuro/Psych Depression  Neuromuscular disease    GI/Hepatic negative GI ROS, Neg liver ROS,   Endo/Other  Obesity  Renal/GU negative Renal ROS     Musculoskeletal  (+) Arthritis ,   Abdominal (+) + obese,   Peds negative pediatric ROS (+)  Hematology negative hematology ROS (+) anemia ,   Anesthesia Other Findings   Reproductive/Obstetrics PCOS                             Lab Results  Component Value Date   WBC 6.2 06/01/2018   HGB 11.4 (L) 06/01/2018   HCT 37.8 06/01/2018   MCV 78.6 06/01/2018   PLT 237 06/01/2018    Anesthesia Physical Anesthesia Plan  ASA: II  Anesthesia Plan: General   Post-op Pain Management:    Induction: Intravenous  PONV Risk Score and Plan: 4 or greater and Treatment may vary due to age or medical condition, Scopolamine patch - Pre-op, Ondansetron and Dexamethasone  Airway Management Planned: Oral ETT  Additional Equipment:   Intra-op Plan:   Post-operative Plan: Extubation in OR  Informed Consent: I have reviewed the patients History and Physical, chart, labs and discussed the procedure including the risks, benefits and alternatives for the proposed anesthesia with the patient or authorized representative who has indicated his/her understanding and acceptance.   Dental advisory given  Plan Discussed with:    Anesthesia Plan Comments: (T&S, dexametomedine)        Anesthesia Quick Evaluation

## 2018-06-09 ENCOUNTER — Encounter (HOSPITAL_COMMUNITY): Payer: Self-pay | Admitting: Certified Registered"

## 2018-06-09 ENCOUNTER — Encounter (HOSPITAL_COMMUNITY)
Admission: RE | Disposition: A | Discharge status: Home / Self Care | Payer: Self-pay | Source: Home / Self Care | Attending: Neurological Surgery

## 2018-06-09 ENCOUNTER — Ambulatory Visit: Payer: BLUE CROSS/BLUE SHIELD | Admitting: Rehabilitation

## 2018-06-09 ENCOUNTER — Inpatient Hospital Stay (HOSPITAL_COMMUNITY): Payer: BLUE CROSS/BLUE SHIELD | Admitting: Anesthesiology

## 2018-06-09 ENCOUNTER — Inpatient Hospital Stay (HOSPITAL_COMMUNITY): Payer: BLUE CROSS/BLUE SHIELD

## 2018-06-09 ENCOUNTER — Inpatient Hospital Stay (HOSPITAL_COMMUNITY)
Admission: RE | Admit: 2018-06-09 | Discharge: 2018-06-12 | DRG: 497 | Disposition: A | Discharge status: Home / Self Care | Payer: BLUE CROSS/BLUE SHIELD | Attending: Neurological Surgery | Admitting: Neurological Surgery

## 2018-06-09 DIAGNOSIS — Z8701 Personal history of pneumonia (recurrent): Secondary | ICD-10-CM

## 2018-06-09 DIAGNOSIS — M479 Spondylosis, unspecified: Secondary | ICD-10-CM | POA: Diagnosis present

## 2018-06-09 DIAGNOSIS — M62838 Other muscle spasm: Secondary | ICD-10-CM | POA: Diagnosis present

## 2018-06-09 DIAGNOSIS — M5417 Radiculopathy, lumbosacral region: Secondary | ICD-10-CM | POA: Diagnosis present

## 2018-06-09 DIAGNOSIS — T84226A Displacement of internal fixation device of vertebrae, initial encounter: Principal | ICD-10-CM | POA: Diagnosis present

## 2018-06-09 DIAGNOSIS — Z8249 Family history of ischemic heart disease and other diseases of the circulatory system: Secondary | ICD-10-CM

## 2018-06-09 DIAGNOSIS — Z96641 Presence of right artificial hip joint: Secondary | ICD-10-CM | POA: Diagnosis present

## 2018-06-09 DIAGNOSIS — Z96653 Presence of artificial knee joint, bilateral: Secondary | ICD-10-CM | POA: Diagnosis present

## 2018-06-09 DIAGNOSIS — M170 Bilateral primary osteoarthritis of knee: Secondary | ICD-10-CM | POA: Diagnosis present

## 2018-06-09 DIAGNOSIS — M5416 Radiculopathy, lumbar region: Secondary | ICD-10-CM | POA: Diagnosis present

## 2018-06-09 DIAGNOSIS — Z803 Family history of malignant neoplasm of breast: Secondary | ICD-10-CM

## 2018-06-09 DIAGNOSIS — Z888 Allergy status to other drugs, medicaments and biological substances status: Secondary | ICD-10-CM

## 2018-06-09 DIAGNOSIS — Z419 Encounter for procedure for purposes other than remedying health state, unspecified: Secondary | ICD-10-CM

## 2018-06-09 DIAGNOSIS — Z8049 Family history of malignant neoplasm of other genital organs: Secondary | ICD-10-CM

## 2018-06-09 DIAGNOSIS — I10 Essential (primary) hypertension: Secondary | ICD-10-CM | POA: Diagnosis present

## 2018-06-09 DIAGNOSIS — Z79899 Other long term (current) drug therapy: Secondary | ICD-10-CM

## 2018-06-09 HISTORY — PX: APPLICATION OF ROBOTIC ASSISTANCE FOR SPINAL PROCEDURE: SHX6753

## 2018-06-09 HISTORY — PX: HARDWARE REMOVAL: SHX979

## 2018-06-09 LAB — CBC
HCT: 31.8 % — ABNORMAL LOW (ref 36.0–46.0)
Hemoglobin: 9.7 g/dL — ABNORMAL LOW (ref 12.0–15.0)
MCH: 23.4 pg — ABNORMAL LOW (ref 26.0–34.0)
MCHC: 30.5 g/dL (ref 30.0–36.0)
MCV: 76.8 fL — ABNORMAL LOW (ref 78.0–100.0)
Platelets: 215 K/uL (ref 150–400)
RBC: 4.14 MIL/uL (ref 3.87–5.11)
RDW: 16.3 % — ABNORMAL HIGH (ref 11.5–15.5)
WBC: 9.8 K/uL (ref 4.0–10.5)

## 2018-06-09 LAB — POCT PREGNANCY, URINE: Preg Test, Ur: NEGATIVE

## 2018-06-09 LAB — CREATININE, SERUM
Creatinine, Ser: 1.07 mg/dL — ABNORMAL HIGH (ref 0.44–1.00)
GFR calc Af Amer: >60 mL/min (ref >60)
GFR calc non Af Amer: >60 mL/min (ref >60)

## 2018-06-09 SURGERY — REMOVAL, HARDWARE
Anesthesia: General | Laterality: Right

## 2018-06-09 MED ORDER — PROPOFOL 10 MG/ML IV BOLUS
INTRAVENOUS | Status: AC
  Filled 2018-06-09: qty 20

## 2018-06-09 MED ORDER — LOSARTAN POTASSIUM 50 MG PO TABS
50.0000 mg | ORAL_TABLET | Freq: Every day | ORAL | Status: DC
  Administered 2018-06-10 – 2018-06-12 (×3): 50 mg via ORAL
  Filled 2018-06-09 (×3): qty 1

## 2018-06-09 MED ORDER — DOCUSATE SODIUM 100 MG PO CAPS
100.0000 mg | ORAL_CAPSULE | Freq: Two times a day (BID) | ORAL | Status: DC
  Administered 2018-06-09 – 2018-06-12 (×6): 100 mg via ORAL
  Filled 2018-06-09 (×6): qty 1

## 2018-06-09 MED ORDER — HYDROCODONE-ACETAMINOPHEN 7.5-325 MG PO TABS
1.0000 | ORAL_TABLET | Freq: Once | ORAL | Status: AC | PRN
  Administered 2018-06-09: 1 via ORAL

## 2018-06-09 MED ORDER — SODIUM CHLORIDE 0.9 % IV SOLN
INTRAVENOUS | Status: DC | PRN
  Administered 2018-06-09: 07:00:00

## 2018-06-09 MED ORDER — THROMBIN 5000 UNITS EX SOLR
CUTANEOUS | Status: DC | PRN
  Administered 2018-06-09 (×2): 5000 [IU] via TOPICAL

## 2018-06-09 MED ORDER — LACTATED RINGERS IV SOLN
INTRAVENOUS | Status: DC
  Administered 2018-06-09: 15:00:00 via INTRAVENOUS

## 2018-06-09 MED ORDER — 0.9 % SODIUM CHLORIDE (POUR BTL) OPTIME
TOPICAL | Status: DC | PRN
  Administered 2018-06-09: 1000 mL

## 2018-06-09 MED ORDER — THROMBIN 5000 UNITS EX SOLR
CUTANEOUS | Status: AC
  Filled 2018-06-09: qty 5000

## 2018-06-09 MED ORDER — MIDAZOLAM HCL 5 MG/5ML IJ SOLN
INTRAMUSCULAR | Status: DC | PRN
  Administered 2018-06-09: 2 mg via INTRAVENOUS

## 2018-06-09 MED ORDER — TRAZODONE HCL 50 MG PO TABS
50.0000 mg | ORAL_TABLET | Freq: Every evening | ORAL | Status: DC | PRN
  Administered 2018-06-10: 50 mg via ORAL
  Filled 2018-06-09 (×2): qty 1

## 2018-06-09 MED ORDER — GABAPENTIN 300 MG PO CAPS
300.0000 mg | ORAL_CAPSULE | Freq: Once | ORAL | Status: AC
  Administered 2018-06-09: 300 mg via ORAL
  Filled 2018-06-09: qty 1

## 2018-06-09 MED ORDER — OXYCODONE-ACETAMINOPHEN 5-325 MG PO TABS
1.0000 | ORAL_TABLET | ORAL | Status: DC | PRN
  Administered 2018-06-09 – 2018-06-11 (×7): 1 via ORAL
  Filled 2018-06-09 (×7): qty 1

## 2018-06-09 MED ORDER — PROMETHAZINE HCL 25 MG/ML IJ SOLN: 6.2500 mg | INTRAMUSCULAR | Status: DC | PRN

## 2018-06-09 MED ORDER — HYDROCHLOROTHIAZIDE 25 MG PO TABS
25.0000 mg | ORAL_TABLET | Freq: Every day | ORAL | Status: DC
  Administered 2018-06-10 – 2018-06-12 (×3): 25 mg via ORAL
  Filled 2018-06-09 (×3): qty 1

## 2018-06-09 MED ORDER — SODIUM CHLORIDE 0.9% FLUSH: 3.0000 mL | INTRAVENOUS | Status: DC | PRN

## 2018-06-09 MED ORDER — ROCURONIUM BROMIDE 50 MG/5ML IV SOSY
PREFILLED_SYRINGE | INTRAVENOUS | Status: AC
  Filled 2018-06-09: qty 5

## 2018-06-09 MED ORDER — PREGABALIN 75 MG PO CAPS
75.0000 mg | ORAL_CAPSULE | Freq: Three times a day (TID) | ORAL | Status: DC
  Administered 2018-06-09 – 2018-06-12 (×9): 75 mg via ORAL
  Filled 2018-06-09 (×9): qty 1

## 2018-06-09 MED ORDER — CHLORHEXIDINE GLUCONATE CLOTH 2 % EX PADS: 6.0000 | MEDICATED_PAD | Freq: Once | CUTANEOUS | Status: DC

## 2018-06-09 MED ORDER — LIDOCAINE-EPINEPHRINE 1 %-1:100000 IJ SOLN
INTRAMUSCULAR | Status: DC | PRN
  Administered 2018-06-09: 20 mL

## 2018-06-09 MED ORDER — LIDOCAINE 2% (20 MG/ML) 5 ML SYRINGE
INTRAMUSCULAR | Status: DC | PRN
  Administered 2018-06-09: 80 mg via INTRAVENOUS

## 2018-06-09 MED ORDER — MENTHOL 3 MG MT LOZG: 1.0000 | LOZENGE | OROMUCOSAL | Status: DC | PRN

## 2018-06-09 MED ORDER — SUGAMMADEX SODIUM 200 MG/2ML IV SOLN
INTRAVENOUS | Status: DC | PRN
  Administered 2018-06-09: 200 mg via INTRAVENOUS

## 2018-06-09 MED ORDER — BUPIVACAINE HCL (PF) 0.5 % IJ SOLN
INTRAMUSCULAR | Status: AC
  Filled 2018-06-09: qty 30

## 2018-06-09 MED ORDER — ACETAMINOPHEN 500 MG PO TABS
1000.0000 mg | ORAL_TABLET | Freq: Once | ORAL | Status: AC
  Administered 2018-06-09: 1000 mg via ORAL
  Filled 2018-06-09: qty 2

## 2018-06-09 MED ORDER — DEXAMETHASONE SODIUM PHOSPHATE 10 MG/ML IJ SOLN
INTRAMUSCULAR | Status: AC
  Filled 2018-06-09: qty 1

## 2018-06-09 MED ORDER — ONDANSETRON HCL 4 MG/2ML IJ SOLN
INTRAMUSCULAR | Status: DC | PRN
  Administered 2018-06-09: 4 mg via INTRAVENOUS

## 2018-06-09 MED ORDER — SERTRALINE HCL 100 MG PO TABS
200.0000 mg | ORAL_TABLET | Freq: Every day | ORAL | Status: DC
  Administered 2018-06-10 – 2018-06-12 (×3): 200 mg via ORAL
  Filled 2018-06-09 (×3): qty 2

## 2018-06-09 MED ORDER — PROPOFOL 10 MG/ML IV BOLUS
INTRAVENOUS | Status: DC | PRN
  Administered 2018-06-09: 190 mg via INTRAVENOUS

## 2018-06-09 MED ORDER — LIDOCAINE 2% (20 MG/ML) 5 ML SYRINGE
INTRAMUSCULAR | Status: AC
  Filled 2018-06-09: qty 5

## 2018-06-09 MED ORDER — MEPERIDINE HCL 50 MG/ML IJ SOLN: 6.2500 mg | INTRAMUSCULAR | Status: DC | PRN

## 2018-06-09 MED ORDER — METHOCARBAMOL 1000 MG/10ML IJ SOLN
500.0000 mg | Freq: Four times a day (QID) | INTRAVENOUS | Status: DC | PRN
  Filled 2018-06-09: qty 5

## 2018-06-09 MED ORDER — METHOCARBAMOL 500 MG PO TABS
500.0000 mg | ORAL_TABLET | Freq: Four times a day (QID) | ORAL | Status: DC | PRN
  Administered 2018-06-09 – 2018-06-10 (×3): 500 mg via ORAL
  Filled 2018-06-09 (×3): qty 1

## 2018-06-09 MED ORDER — HYDROCODONE-ACETAMINOPHEN 7.5-325 MG PO TABS
ORAL_TABLET | ORAL | Status: AC
  Filled 2018-06-09: qty 1

## 2018-06-09 MED ORDER — ENOXAPARIN SODIUM 40 MG/0.4ML ~~LOC~~ SOLN
40.0000 mg | SUBCUTANEOUS | Status: DC
  Administered 2018-06-10 – 2018-06-12 (×3): 40 mg via SUBCUTANEOUS
  Filled 2018-06-09 (×4): qty 0.4

## 2018-06-09 MED ORDER — LORCASERIN HCL ER 20 MG PO TB24: 20.0000 mg | ORAL_TABLET | Freq: Every day | ORAL | Status: DC

## 2018-06-09 MED ORDER — ALPRAZOLAM 0.5 MG PO TABS: 1.0000 mg | ORAL_TABLET | Freq: Every day | ORAL | Status: DC | PRN

## 2018-06-09 MED ORDER — FENTANYL CITRATE (PF) 250 MCG/5ML IJ SOLN
INTRAMUSCULAR | Status: AC
  Filled 2018-06-09: qty 5

## 2018-06-09 MED ORDER — SCOPOLAMINE 1 MG/3DAYS TD PT72
1.0000 | MEDICATED_PATCH | TRANSDERMAL | Status: DC
  Administered 2018-06-09: 1.5 mg via TRANSDERMAL
  Filled 2018-06-09: qty 1

## 2018-06-09 MED ORDER — VANCOMYCIN HCL 1000 MG IV SOLR
INTRAVENOUS | Status: AC
  Filled 2018-06-09: qty 1000

## 2018-06-09 MED ORDER — ACETAMINOPHEN 10 MG/ML IV SOLN: 1000.0000 mg | Freq: Once | INTRAVENOUS | Status: DC | PRN

## 2018-06-09 MED ORDER — ONDANSETRON HCL 4 MG/2ML IJ SOLN: 4.0000 mg | Freq: Four times a day (QID) | INTRAMUSCULAR | Status: DC | PRN

## 2018-06-09 MED ORDER — FLEET ENEMA 7-19 GM/118ML RE ENEM: 1.0000 | ENEMA | Freq: Once | RECTAL | Status: DC | PRN

## 2018-06-09 MED ORDER — CEFAZOLIN SODIUM-DEXTROSE 2-4 GM/100ML-% IV SOLN
2.0000 g | INTRAVENOUS | Status: AC
  Administered 2018-06-09: 2 g via INTRAVENOUS

## 2018-06-09 MED ORDER — FENTANYL CITRATE (PF) 100 MCG/2ML IJ SOLN
INTRAMUSCULAR | Status: DC | PRN
  Administered 2018-06-09: 50 ug via INTRAVENOUS
  Administered 2018-06-09: 150 ug via INTRAVENOUS
  Administered 2018-06-09: 50 ug via INTRAVENOUS
  Administered 2018-06-09: 100 ug via INTRAVENOUS

## 2018-06-09 MED ORDER — MORPHINE SULFATE (PF) 2 MG/ML IV SOLN
2.0000 mg | INTRAVENOUS | Status: DC | PRN
  Administered 2018-06-09 – 2018-06-10 (×3): 2 mg via INTRAVENOUS
  Filled 2018-06-09 (×2): qty 1

## 2018-06-09 MED ORDER — OXYCODONE-ACETAMINOPHEN 10-325 MG PO TABS: 1.0000 | ORAL_TABLET | Freq: Four times a day (QID) | ORAL | Status: DC | PRN

## 2018-06-09 MED ORDER — ONDANSETRON HCL 4 MG PO TABS: 4.0000 mg | ORAL_TABLET | Freq: Four times a day (QID) | ORAL | Status: DC | PRN

## 2018-06-09 MED ORDER — METHOCARBAMOL 500 MG PO TABS
ORAL_TABLET | ORAL | Status: AC
  Filled 2018-06-09: qty 1

## 2018-06-09 MED ORDER — ACETAMINOPHEN 325 MG PO TABS
650.0000 mg | ORAL_TABLET | ORAL | Status: DC | PRN
  Administered 2018-06-12: 650 mg via ORAL
  Filled 2018-06-09: qty 2

## 2018-06-09 MED ORDER — DEXAMETHASONE SODIUM PHOSPHATE 10 MG/ML IJ SOLN
INTRAMUSCULAR | Status: DC | PRN
  Administered 2018-06-09: 10 mg via INTRAVENOUS

## 2018-06-09 MED ORDER — OXYCODONE HCL 5 MG PO TABS
5.0000 mg | ORAL_TABLET | ORAL | Status: DC | PRN
  Administered 2018-06-09 – 2018-06-12 (×5): 5 mg via ORAL
  Filled 2018-06-09 (×5): qty 1

## 2018-06-09 MED ORDER — THROMBIN 5000 UNITS EX SOLR
OROMUCOSAL | Status: DC | PRN
  Administered 2018-06-09: 07:00:00 via TOPICAL

## 2018-06-09 MED ORDER — MIDAZOLAM HCL 2 MG/2ML IJ SOLN
INTRAMUSCULAR | Status: AC
  Filled 2018-06-09: qty 2

## 2018-06-09 MED ORDER — CEFAZOLIN SODIUM-DEXTROSE 2-4 GM/100ML-% IV SOLN
INTRAVENOUS | Status: AC
  Filled 2018-06-09: qty 100

## 2018-06-09 MED ORDER — HYDROMORPHONE HCL 1 MG/ML IJ SOLN
INTRAMUSCULAR | Status: AC
  Filled 2018-06-09: qty 1

## 2018-06-09 MED ORDER — CEFAZOLIN SODIUM-DEXTROSE 2-4 GM/100ML-% IV SOLN
2.0000 g | Freq: Three times a day (TID) | INTRAVENOUS | Status: AC
  Administered 2018-06-09 – 2018-06-10 (×2): 2 g via INTRAVENOUS
  Filled 2018-06-09 (×3): qty 100

## 2018-06-09 MED ORDER — PHENYLEPHRINE 40 MCG/ML (10ML) SYRINGE FOR IV PUSH (FOR BLOOD PRESSURE SUPPORT)
PREFILLED_SYRINGE | INTRAVENOUS | Status: DC | PRN
  Administered 2018-06-09 (×2): 120 ug via INTRAVENOUS
  Administered 2018-06-09 (×2): 80 ug via INTRAVENOUS

## 2018-06-09 MED ORDER — ACETAMINOPHEN 650 MG RE SUPP: 650.0000 mg | RECTAL | Status: DC | PRN

## 2018-06-09 MED ORDER — BACLOFEN 10 MG PO TABS
30.0000 mg | ORAL_TABLET | Freq: Three times a day (TID) | ORAL | Status: DC
  Administered 2018-06-09 – 2018-06-12 (×9): 30 mg via ORAL
  Filled 2018-06-09 (×2): qty 3
  Filled 2018-06-09: qty 1
  Filled 2018-06-09 (×2): qty 3
  Filled 2018-06-09: qty 1
  Filled 2018-06-09 (×4): qty 3

## 2018-06-09 MED ORDER — BISACODYL 10 MG RE SUPP: 10.0000 mg | Freq: Every day | RECTAL | Status: DC | PRN

## 2018-06-09 MED ORDER — ROCURONIUM BROMIDE 10 MG/ML (PF) SYRINGE
PREFILLED_SYRINGE | INTRAVENOUS | Status: DC | PRN
  Administered 2018-06-09 (×2): 10 mg via INTRAVENOUS
  Administered 2018-06-09: 50 mg via INTRAVENOUS

## 2018-06-09 MED ORDER — PHENOL 1.4 % MT LIQD: 1.0000 | OROMUCOSAL | Status: DC | PRN

## 2018-06-09 MED ORDER — HYDROMORPHONE HCL 1 MG/ML IJ SOLN
0.2500 mg | INTRAMUSCULAR | Status: DC | PRN
  Administered 2018-06-09 (×2): 0.5 mg via INTRAVENOUS

## 2018-06-09 MED ORDER — LORATADINE 10 MG PO TABS
10.0000 mg | ORAL_TABLET | Freq: Every day | ORAL | Status: DC
  Administered 2018-06-09 – 2018-06-12 (×4): 10 mg via ORAL
  Filled 2018-06-09 (×4): qty 1

## 2018-06-09 MED ORDER — DIAZEPAM 5 MG PO TABS: 5.0000 mg | ORAL_TABLET | Freq: Four times a day (QID) | ORAL | Status: DC | PRN

## 2018-06-09 MED ORDER — POLYETHYLENE GLYCOL 3350 17 G PO PACK: 17.0000 g | PACK | Freq: Every day | ORAL | Status: DC | PRN

## 2018-06-09 MED ORDER — ALUM & MAG HYDROXIDE-SIMETH 200-200-20 MG/5ML PO SUSP: 30.0000 mL | Freq: Four times a day (QID) | ORAL | Status: DC | PRN

## 2018-06-09 MED ORDER — BUPROPION HCL ER (XL) 150 MG PO TB24
300.0000 mg | ORAL_TABLET | Freq: Every day | ORAL | Status: DC
  Administered 2018-06-10 – 2018-06-12 (×3): 300 mg via ORAL
  Filled 2018-06-09 (×3): qty 2

## 2018-06-09 MED ORDER — SODIUM CHLORIDE 0.9 % IV SOLN
INTRAVENOUS | Status: DC | PRN
  Administered 2018-06-09: 25 ug/min via INTRAVENOUS

## 2018-06-09 MED ORDER — LACTATED RINGERS IV SOLN
INTRAVENOUS | Status: DC | PRN
  Administered 2018-06-09 (×2): via INTRAVENOUS

## 2018-06-09 MED ORDER — BUPIVACAINE HCL (PF) 0.5 % IJ SOLN
INTRAMUSCULAR | Status: DC | PRN
  Administered 2018-06-09: 30 mL

## 2018-06-09 MED ORDER — LIDOCAINE-EPINEPHRINE 1 %-1:100000 IJ SOLN
INTRAMUSCULAR | Status: AC
  Filled 2018-06-09: qty 1

## 2018-06-09 MED ORDER — SENNA 8.6 MG PO TABS
1.0000 | ORAL_TABLET | Freq: Two times a day (BID) | ORAL | Status: DC
  Administered 2018-06-09 – 2018-06-12 (×6): 8.6 mg via ORAL
  Filled 2018-06-09 (×6): qty 1

## 2018-06-09 MED ORDER — ONDANSETRON HCL 4 MG/2ML IJ SOLN
INTRAMUSCULAR | Status: AC
  Filled 2018-06-09: qty 2

## 2018-06-09 MED ORDER — PHENYLEPHRINE 40 MCG/ML (10ML) SYRINGE FOR IV PUSH (FOR BLOOD PRESSURE SUPPORT)
PREFILLED_SYRINGE | INTRAVENOUS | Status: AC
  Filled 2018-06-09: qty 10

## 2018-06-09 MED ORDER — SODIUM CHLORIDE 0.9% FLUSH
3.0000 mL | Freq: Two times a day (BID) | INTRAVENOUS | Status: DC
  Administered 2018-06-09 – 2018-06-12 (×7): 3 mL via INTRAVENOUS

## 2018-06-09 MED ORDER — LOSARTAN POTASSIUM-HCTZ 50-12.5 MG PO TABS: 1.0000 | ORAL_TABLET | Freq: Every day | ORAL | Status: DC

## 2018-06-09 MED ORDER — MORPHINE SULFATE (PF) 2 MG/ML IV SOLN
INTRAVENOUS | Status: AC
  Filled 2018-06-09: qty 1

## 2018-06-09 SURGICAL SUPPLY — 70 items
BAG DECANTER FOR FLEXI CONT (MISCELLANEOUS) ×3 IMPLANT
BENZOIN TINCTURE PRP APPL 2/3 (GAUZE/BANDAGES/DRESSINGS) IMPLANT
BIT DRILL LONG 3.0X30 (BIT) ×3 IMPLANT
BIT DRILL LONG 3X80 (BIT) IMPLANT
BIT DRILL LONG 4X80 (BIT) IMPLANT
BIT DRILL SHORT 3.0X30 (BIT) IMPLANT
BIT DRILL SHORT 3X80 (BIT) IMPLANT
BLADE CLIPPER SURG (BLADE) IMPLANT
BLADE SURG 11 STRL SS (BLADE) ×3 IMPLANT
CANISTER SUCT 3000ML PPV (MISCELLANEOUS) ×3 IMPLANT
CARTRIDGE OIL MAESTRO DRILL (MISCELLANEOUS) IMPLANT
DECANTER SPIKE VIAL GLASS SM (MISCELLANEOUS) ×3 IMPLANT
DERMABOND ADVANCED (GAUZE/BANDAGES/DRESSINGS) ×1
DERMABOND ADVANCED .7 DNX12 (GAUZE/BANDAGES/DRESSINGS) ×2 IMPLANT
DIFFUSER DRILL AIR PNEUMATIC (MISCELLANEOUS) IMPLANT
DRAPE C-ARM 42X72 X-RAY (DRAPES) ×3 IMPLANT
DRAPE C-ARMOR (DRAPES) ×3 IMPLANT
DRAPE LAPAROTOMY 100X72 PEDS (DRAPES) IMPLANT
DRAPE LAPAROTOMY 100X72X124 (DRAPES) ×3 IMPLANT
DRAPE POUCH INSTRU U-SHP 10X18 (DRAPES) ×3 IMPLANT
DRAPE SHEET LG 3/4 BI-LAMINATE (DRAPES) ×3 IMPLANT
DRSG OPSITE POSTOP 4X6 (GAUZE/BANDAGES/DRESSINGS) ×3 IMPLANT
DURAPREP 26ML APPLICATOR (WOUND CARE) ×3 IMPLANT
ELECT BLADE 4.0 EZ CLEAN MEGAD (MISCELLANEOUS) ×3
ELECT REM PT RETURN 9FT ADLT (ELECTROSURGICAL) ×3
ELECTRODE BLDE 4.0 EZ CLN MEGD (MISCELLANEOUS) ×2 IMPLANT
ELECTRODE REM PT RTRN 9FT ADLT (ELECTROSURGICAL) ×2 IMPLANT
GAUZE 4X4 16PLY RFD (DISPOSABLE) IMPLANT
GAUZE SPONGE 4X4 12PLY STRL (GAUZE/BANDAGES/DRESSINGS) ×3 IMPLANT
GAUZE SPONGE 4X4 16PLY XRAY LF (GAUZE/BANDAGES/DRESSINGS) ×3 IMPLANT
GLOVE BIOGEL PI IND STRL 8.5 (GLOVE) ×2 IMPLANT
GLOVE BIOGEL PI INDICATOR 8.5 (GLOVE) ×1
GLOVE ECLIPSE 8.5 STRL (GLOVE) ×9 IMPLANT
GLOVE SURG SS PI 6.5 STRL IVOR (GLOVE) ×9 IMPLANT
GOWN STRL REUS W/ TWL LRG LVL3 (GOWN DISPOSABLE) ×4 IMPLANT
GOWN STRL REUS W/ TWL XL LVL3 (GOWN DISPOSABLE) ×2 IMPLANT
GOWN STRL REUS W/TWL 2XL LVL3 (GOWN DISPOSABLE) ×3 IMPLANT
GOWN STRL REUS W/TWL LRG LVL3 (GOWN DISPOSABLE) ×2
GOWN STRL REUS W/TWL XL LVL3 (GOWN DISPOSABLE) ×1
GUIDEWIRE NITINOL BEVEL TIP (WIRE) ×6 IMPLANT
KIT BASIN OR (CUSTOM PROCEDURE TRAY) ×3 IMPLANT
KIT SPINE MAZOR X ROBO DISP (MISCELLANEOUS) ×6 IMPLANT
KIT TURNOVER KIT B (KITS) ×3 IMPLANT
MARKER SKIN DUAL TIP RULER LAB (MISCELLANEOUS) ×3 IMPLANT
NEEDLE HYPO 22GX1.5 SAFETY (NEEDLE) ×3 IMPLANT
NEEDLE SPNL 22GX3.5 QUINCKE BK (NEEDLE) ×3 IMPLANT
NS IRRIG 1000ML POUR BTL (IV SOLUTION) ×3 IMPLANT
OIL CARTRIDGE MAESTRO DRILL (MISCELLANEOUS)
PACK LAMINECTOMY NEURO (CUSTOM PROCEDURE TRAY) ×3 IMPLANT
PAD ARMBOARD 7.5X6 YLW CONV (MISCELLANEOUS) ×3 IMPLANT
PIN HEAD 2.5X60MM (PIN) IMPLANT
RASP 3.0MM (RASP) IMPLANT
ROD RELINE MAS LORD 5.5X95MM (Rod) ×3 IMPLANT
SCREW LOCK RELINE 5.5 TULIP (Screw) ×12 IMPLANT
SCREW MAS RELINE 6.5X45 POLY (Screw) ×6 IMPLANT
SCREW SCHANZ SA 4.0MM (MISCELLANEOUS) ×3 IMPLANT
SPONGE LAP 4X18 RFD (DISPOSABLE) IMPLANT
SPONGE SURGIFOAM ABS GEL SZ50 (HEMOSTASIS) ×3 IMPLANT
STAPLER SKIN PROX WIDE 3.9 (STAPLE) IMPLANT
STRIP CLOSURE SKIN 1/2X4 (GAUZE/BANDAGES/DRESSINGS) IMPLANT
SUT VIC AB 0 CT1 18XCR BRD8 (SUTURE) ×2 IMPLANT
SUT VIC AB 0 CT1 8-18 (SUTURE) ×1
SUT VIC AB 2-0 CP2 18 (SUTURE) ×3 IMPLANT
SUT VIC AB 3-0 SH 8-18 (SUTURE) ×6 IMPLANT
SWAB COLLECTION DEVICE MRSA (MISCELLANEOUS) IMPLANT
SWAB CULTURE ESWAB REG 1ML (MISCELLANEOUS) IMPLANT
TOWEL GREEN STERILE (TOWEL DISPOSABLE) ×3 IMPLANT
TOWEL GREEN STERILE FF (TOWEL DISPOSABLE) ×3 IMPLANT
TUBE MAZOR SA REDUCTION (TUBING) ×3 IMPLANT
WATER STERILE IRR 1000ML POUR (IV SOLUTION) ×3 IMPLANT

## 2018-06-09 NOTE — Transfer of Care (Signed)
Immediate Anesthesia Transfer of Care Note  Patient: Ashley [name]  Procedure(s) Performed: Repositioning of Right Lumbar three Right lumbar  four pedicle screw with MAZOR (Right ) APPLICATION OF ROBOTIC ASSISTANCE FOR SPINAL PROCEDURE (N/A )  Patient Location: PACU  Anesthesia Type:General  Level of Consciousness: awake and patient cooperative  Airway & Oxygen Therapy: Patient Spontanous Breathing and Patient connected to nasal cannula oxygen  Post-op Assessment: Report given to RN, Post -op Vital signs reviewed and stable and Patient moving all extremities  Post vital signs: Reviewed and stable  Last Vitals:  Vitals Value Taken Time  BP    Temp    Pulse 90 06/09/2018 10:37 AM  Resp    SpO2 99 % 06/09/2018 10:37 AM  Vitals shown include unvalidated device data.  Last Pain:  Vitals:   06/09/18 0644  TempSrc: Oral  PainSc:          Complications: No apparent anesthesia complications

## 2018-06-09 NOTE — H&P (Signed)
Ashley [name] is an 42 y.o. female.   Chief Complaint: Back and leg pain on the right side with weakness HPI: Ashley [name] is a 42 year old individual who earlier this year had had surgical decompression and stabilization from L1 3 down to L5.  He has had evidence of severe weakness in the right lower extremity in the iliopsoas and the quad on the right side and ultimately work-up demonstrated that the L4 pedicle screw on the right side was inferiorly position.  This likely is affecting the path of the L4 nerve root.  After careful consideration I advised repositioning of the hardware on the right side and she is now admitted for this procedure.  Notable is that Ashley [name] has been having a hard time with her gait secondary to weakness on the right side a few months ago she had a fall which exacerbated substantial pain in her back and caused a work-up to ensue regarding to the hardware positioning.  No fractures were encountered at that time.  Patient had surgery on May 31 initially and she has been dealing with weakness in her right leg since that time.  Despite physical therapy and substantial efforts she still has weakness in the quadricep on the right side.  Past Medical History:  Diagnosis Date  . Arthritis    knees, back  . 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 Complication of anesthesia   . Depression   . History of pneumonia   . Hypertension   . Lumbar spondylosis   . PCOS (polycystic ovarian syndrome)   . Pneumonia    2016  . PONV (postoperative nausea and vomiting)     Past Surgical History:  Procedure Laterality Date  . ABDOMINAL EXPOSURE N/A 02/17/2018   Procedure: ABDOMINAL EXPOSURE;  Surgeon: Rosetta Posner, MD;  Location: [facility] OR;  Service: Vascular;  Laterality: N/A;  . ANKLE SURGERY     x 2  . ANTERIOR LAT LUMBAR FUSION N/A 02/17/2018   Procedure: Lumbar three-four Lumbar four-five  Anterolateral lumbar interbody fusion;  Surgeon: Kristeen Miss, MD;  Location: [place];  Service:  Neurosurgery;  Laterality: N/A;  . ANTERIOR LUMBAR FUSION N/A 02/17/2018   Procedure: Lumbar five-Sacral one Anterior lumbar interbody fusion;  Surgeon: Kristeen Miss, MD;  Location: [place];  Service: Neurosurgery;  Laterality: N/A;  . APPLICATION OF ROBOTIC ASSISTANCE FOR SPINAL PROCEDURE N/A 02/17/2018   Procedure: APPLICATION OF ROBOTIC ASSISTANCE FOR SPINAL PROCEDURE;  Surgeon: Kristeen Miss, MD;  Location: [place];  Service: Neurosurgery;  Laterality: N/A;  . BACK SURGERY     2017  discectomy  . CARPAL TUNNEL RELEASE Left 07/04/2014   Procedure: LEFT CARPAL TUNNEL RELEASE;  Surgeon: Ninetta Lights, MD;  Location: [place];  Service: Orthopedics;  Laterality: Left;  . CARPAL TUNNEL RELEASE Right 08/08/2014   Procedure: RIGHT CARPAL TUNNEL RELEASE;  Surgeon: Ninetta Lights, MD;  Location: [place];  Service: Orthopedics;  Laterality: Right;  . DILATION AND CURETTAGE OF UTERUS    . DILATION AND EVACUATION  04/02/2011   Procedure: DILATATION AND EVACUATION (D&E);  Surgeon: Luz Lex, MD;  Location: [facility];  Service: Gynecology;  Laterality: N/A;  dvt left mid thigh  . DORSAL COMPARTMENT RELEASE Left 07/04/2014   Procedure: LEFT DEQUERVAINS;  Surgeon: Ninetta Lights, MD;  Location: [place];  Service: Orthopedics;  Laterality: Left;  . ENDOSCOPIC PLANTAR FASCIOTOMY Left 02/01/2002  . HYSTEROSCOPY W/D&C  07/17/2010   with exc.  endometrial polyps  . KNEE ARTHROSCOPY Right 03/07/2003; 01/14/2005; 08/31/2006  . KNEE ARTHROSCOPY Left 03/21/2003; 11/26/2004  . LUMBAR PERCUTANEOUS PEDICLE SCREW 3 LEVEL N/A 02/17/2018   Procedure: LUMBAR PERCUTANEOUS PEDICLE SCREW PLACEMENT LUMBAR THREE-SACRAL ONE;  Surgeon: Kristeen Miss, MD;  Location: [place];  Service: Neurosurgery;  Laterality: N/A;  . SHOULDER ARTHROSCOPY Right   . TOE SURGERY Left 01/10/2003   claw toe correction 2nd, 3rd, 4th toes  . TOTAL HIP ARTHROPLASTY Right 04/21/2016  . TOTAL HIP ARTHROPLASTY  Right 04/21/2016   Procedure: TOTAL HIP ARTHROPLASTY ANTERIOR APPROACH;  Surgeon: Ninetta Lights, MD;  Location: [place];  Service: Orthopedics;  Laterality: Right;  . TOTAL KNEE ARTHROPLASTY Left 01/21/2010  . TOTAL KNEE ARTHROPLASTY Right 08/26/2008    Family History  Problem Relation Age of Onset  . Cancer Maternal Grandmother        UTERINE  . Cancer Paternal Grandmother        breast  . Diabetes Father   . Cancer Father        COLON  . Heart disease Father   . Kidney disease Father        dialysis  . Diabetes Sister    Social History:  reports that she has never smoked. She has never used smokeless tobacco. She reports that she does not drink alcohol or use drugs.  Allergies:  Allergies  Allergen Reactions  . Gadolinium Derivatives Nausea And Vomiting and Other (See Comments)    Pt describes this happens every time she gets gado even with slow injection  . Codeine Itching, Nausea Only and Other (See Comments)    Hydrocodone does not affect pt  . Cymbalta [Duloxetine Hcl] Other (See Comments)    Altered mental status    Medications Prior to Admission  Medication Sig Dispense Refill  . ALPRAZolam (XANAX) 1 MG tablet Take 1 tablet (1 mg total) by mouth daily as needed for anxiety. 30 tablet 0  . baclofen (LIORESAL) 10 MG tablet Take 3 tablets (30 mg total) by mouth 3 (three) times daily. 270 each 1  . buPROPion (WELLBUTRIN XL) 300 MG 24 hr tablet Take 1 tablet (300 mg total) by mouth daily. 30 tablet 0  . cetirizine (ZYRTEC) 10 MG tablet Take 10 mg by mouth daily as needed for allergies.     Marland Kitchen lidocaine (LIDODERM) 5 % Place 1 patch onto the skin daily as needed (for pain). Remove & Discard patch within 12 hours or as directed by MD     . losartan-hydrochlorothiazide (HYZAAR) 50-12.5 MG tablet Take 1 tablet by mouth daily. 90 tablet 3  . Multiple Vitamins-Minerals (MULTIVITAMIN WITH MINERALS) tablet Take 1 tablet daily by mouth.    . oxyCODONE-acetaminophen (PERCOCET) 10-325 MG  tablet Take 1 tablet by mouth 4 (four) times daily as needed for severe pain.  0  . pregabalin (LYRICA) 75 MG capsule Take 1 capsule (75 mg total) by mouth 3 (three) times daily. 90 capsule 1  . sertraline (ZOLOFT) 100 MG tablet Take 1 tablet (100 mg total) by mouth daily. (Patient taking differently: Take 200 mg by mouth daily. ) 30 tablet 0  . traZODone (DESYREL) 50 MG tablet Take 50 mg by mouth at bedtime as needed for sleep.   1  . EPINEPHrine 0.3 mg/0.3 mL IJ SOAJ injection Inject 3 mLs into the muscle once.  0  . Lorcaserin HCl ER (BELVIQ XR) 20 MG TB24 Take 20 mg by mouth daily.      Results for orders  placed or performed during the hospital encounter of 06/09/18 (from the past 48 hour(s))  Pregnancy, urine POC     Status: None   Collection Time: 06/09/18  6:37 AM  Result Value Ref Range   Preg Test, Ur NEGATIVE NEGATIVE    Comment:        THE SENSITIVITY OF THIS METHODOLOGY IS >24 mIU/mL    No results found.  Review of Systems  Constitutional: Negative.   HENT: Negative.   Eyes: Negative.   Respiratory: Negative.   Cardiovascular: Negative.   Gastrointestinal: Negative.   Genitourinary: Negative.   Musculoskeletal: Positive for back pain.  Skin: Negative.   Neurological:       Weakness in the iliopsoas and quadricep on the left side.  Endo/Heme/Allergies: Negative.   Psychiatric/Behavioral: Negative.     Blood pressure (!) 161/75, pulse 79, temperature 97.9 F (36.6 C), temperature source Oral, resp. rate 20, height 5\' 11"  (1.803 m), weight 116.4 kg, last menstrual period 06/07/2018, SpO2 100 %. Physical Exam  Constitutional: She is oriented to person, place, and time. She appears well-developed and well-nourished.  HENT:  Head: Normocephalic and atraumatic.  Eyes: Pupils are equal, round, and reactive to light. Conjunctivae and EOM are normal.  Neck: Normal range of motion. Neck supple.  Cardiovascular: Normal rate and regular rhythm.  Respiratory: Effort normal  and breath sounds normal.  GI: Soft. Bowel sounds are normal.  Musculoskeletal:  Incisions on her back and her flank are well-healed.  Tenderness to palpation directly in the midline of the back  Neurological: She is alert and oriented to person, place, and time.  2 out of 5 strength in the quadriceps on the right 4 out of 5 strength in iliopsoas on the right compared to the left  Skin: Skin is warm and dry.  Psychiatric: She has a normal mood and affect. Her behavior is normal. Judgment and thought content normal.     Assessment/Plan L4 radiculopathy secondary to hardware positioning on the right.   Patient is to have hardware reposition at L4 on the right side.  Earleen Newport, MD 06/09/2018, 7:33 AM

## 2018-06-09 NOTE — Progress Notes (Signed)
Patient ID: Ashley [name], female   DOB: Aug 10, 1976, 42 y.o.   MRN: [id number] All signs are stable Motor function is good save for a week quadricep and iliopsoas on the right side Incision  has some bleedthrough Have encouraged patient to mobilize and noted that she can go home anytime however she notes that she does not feel ready to go home now until perhaps Sunday or perhaps Monday she may feel better tomorrow.

## 2018-06-09 NOTE — Op Note (Signed)
Date of surgery: 06/09/2018 Preoperative diagnosis: Right L4 radiculopathy status post decompression and fusion L3 to sacrum with pedicle screw dislocation L4 right Postoperative diagnosis same Procedure: Revision of posterior instrumentation with replacement of right L3 and L4 pedicle screws using robotic assistance. Surgeon: Kristeen Miss Anesthesia: General endotracheal Indications: Ashley [name] at [place] is a 42 year old individual who in May of this year had undergone surgical decompression using an indirect technique at L3 445 and 5 1.  She underwent pedicular fixation from L3 to the sacrum.  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  She still has significant weakness in the iliopsoas and the quad and follow-up studies demonstrate inferior positioning of the L4 pedicle screw which is not appreciated at the time of surgery and on the initial postoperative x-rays.  She has been advised regarding the need for revision surgery to replace the pedicle screw at L3 and L4 and to obtain better in-line fixation.  It does appear that she is forming early evidence of a fusion but given the immature nature of this fusion is been decided that she should continue to have the hardware in position.  Procedure: Patient was brought to the operating room supine on the stretcher.  After smooth induction of general endotracheal anesthesia she was carefully turned prone onto the [facility] table.  The robotic arm was attached to the table and after prepping the back with alcohol and DuraPrep and draped in a sterile fashion a Steinmann pin was placed in the right posterior superior iliac crest.  Then a paramedian incision incision was created on the right side to expose the old hardware.  Screw caps were loosened using a Metrix retractor system to locate each of the  screws and then the rod was removed.  Registration x-rays were obtained in AP and lateral projection to register the robotic arm to the patient.  K wires were then passed into the preselected trajectory at L3 and L4 which had been chosen preoperatively and verified.  Radiographs were obtained to make sure that the K wire projections were in the proper direction and then over each K wire after removal of the previously placed pedicle screws new 6.5 x 45 mm pedicle screws were placed into L3 and L4.  Towers were then applied L4 and L5 and rod length was measured and was found to be 5 mm longer than the previously placed rod.  A new rod was then passed between the towers and secured.  Final radiographic confirmation was obtained in AP and lateral projection this was felt to be adequate the lumbodorsal fascia in the [place] type incision was closed with 2-0 Vicryl interrupted fashion 3-0 Vicryl was used in the subarticular tissues and Dermabond was placed on the skin blood loss for the procedure was estimated at less than 100 cc.  Patient was returned to recovery room in stable condition

## 2018-06-09 NOTE — Anesthesia Procedure Notes (Signed)
Procedure Name: Intubation Date/Time: 06/09/2018 7:46 AM Performed by: Moshe Salisbury, CRNA Pre-anesthesia Checklist: Patient identified, Emergency Drugs available, Suction available and Patient being monitored Patient Re-evaluated:Patient Re-evaluated prior to induction Oxygen Delivery Method: Circle System Utilized Preoxygenation: Pre-oxygenation with 100% oxygen Induction Type: IV induction Ventilation: Mask ventilation without difficulty Laryngoscope Size: Mac and 3 Grade View: Grade I Tube type: Oral Tube size: 7.5 mm Number of attempts: 1 Airway Equipment and Method: Stylet Placement Confirmation: ETT inserted through vocal cords under direct vision,  positive ETCO2 and breath sounds checked- equal and bilateral Secured at: 20 cm Tube secured with: Tape Dental Injury: Teeth and Oropharynx as per pre-operative assessment

## 2018-06-09 NOTE — Evaluation (Signed)
Physical Therapy Evaluation Patient Details Name: Ashley [name] MRN: [id number] DOB: [birth year] Today's Date: 06/09/2018   History of Present Illness  Pt is a 42 y/o female s/p L3-4 revision of instrumentation. PMH includes R THA, bilateral TKA, HTN, and lumbar surgery.   Clinical Impression  Patient is s/p above surgery resulting in the deficits listed below (see PT Problem List). Pt limited secondary to back pain and fatigue. Pt with weakness in RLE and was unsteady throughout gait. Required min A for steadying assist with use of RW. Educated about back precautions.  Patient will benefit from skilled PT to increase their independence and safety with mobility (while adhering to their precautions) to allow discharge to the venue listed below.     Follow Up Recommendations Home health PT;Supervision for mobility/OOB    Equipment Recommendations  Rolling walker with 5" wheels    Recommendations for Other Services       Precautions / Restrictions Precautions Precautions: Back Precaution Booklet Issued: Yes (comment) Precaution Comments: Reviewed back precautions with pt.  Restrictions Weight Bearing Restrictions: No      Mobility  Bed Mobility Overal bed mobility: Needs Assistance Bed Mobility: Rolling;Sidelying to Sit;Sit to Sidelying Rolling: Supervision(bed rails ) Sidelying to sit: Min assist     Sit to sidelying: Min assist General bed mobility comments: Supervision for rolling with use of bed rails. MIn A for RLE assist to come to sitting and back to supine. Verbal cues for log roll technique.   Transfers Overall transfer level: Needs assistance Equipment used: Rolling walker (2 wheeled) Transfers: Sit to/from Stand Sit to Stand: Mod assist;Min assist         General transfer comment: Mod A for lift assist and steadying to stand initially with RW. Pt reports some dizziness, so had pt return to sitting. On second attempt requiring min A for lift assist and  steadying and reports dizziness had improved.   Ambulation/Gait Ambulation/Gait assistance: Min assist Gait Distance (Feet): 20 Feet Assistive device: Rolling walker (2 wheeled) Gait Pattern/deviations: Step-to pattern;Decreased step length - left;Decreased step length - right;Decreased dorsiflexion - right Gait velocity: Decreased    General Gait Details: Slow, unsteady gait. Pt with limited step height in RLE secondary to weakness at baseline. Requiring min A for steadying. Distance limited secondary to pain and fatigue.   Stairs            Wheelchair Mobility    Modified Rankin (Stroke Patients Only)       Balance Overall balance assessment: Needs assistance Sitting-balance support: No upper extremity supported;Feet supported Sitting balance-Leahy Scale: Fair     Standing balance support: Bilateral upper extremity supported;During functional activity Standing balance-Leahy Scale: Poor Standing balance comment: Reliant on BUE support                              Pertinent Vitals/Pain Pain Assessment: 0-10 Pain Score: 7  Pain Location: back  Pain Descriptors / Indicators: Aching;Operative site guarding Pain Intervention(s): Limited activity within patient's tolerance;Monitored during session;Repositioned    Home Living Family/patient expects to be discharged to:: Private residence Living Arrangements: Children Available Help at Discharge: Family;Available 24 hours/day Type of Home: House Home Access: Stairs to enter Entrance Stairs-Rails: None Entrance Stairs-Number of Steps: 2(1 porch step and 1 threshold ) Home Layout: One level Home Equipment: Cane - single point;Bedside commode;Tub bench      Prior Function Level of Independence: Independent  Comments: Reports she was independent with mobility, however, would have son walk close by.      Hand Dominance   Dominant Hand: Right    Extremity/Trunk Assessment   Upper Extremity  Assessment Upper Extremity Assessment: Defer to OT evaluation    Lower Extremity Assessment Lower Extremity Assessment: RLE deficits/detail RLE Deficits / Details: RLE weakness at baseline. Grossly 2+/5 throughout.     Cervical / Trunk Assessment Cervical / Trunk Assessment: Other exceptions Cervical / Trunk Exceptions: s/p lumbar surgery   Communication   Communication: No difficulties  Cognition Arousal/Alertness: Awake/alert Behavior During Therapy: WFL for tasks assessed/performed Overall Cognitive Status: Within Functional Limits for tasks assessed                                        General Comments      Exercises     Assessment/Plan    PT Assessment Patient needs continued PT services  PT Problem List Decreased strength;Decreased balance;Decreased mobility;Decreased knowledge of precautions;Decreased activity tolerance;Pain       PT Treatment Interventions DME instruction;Gait training;Stair training;Functional mobility training;Therapeutic activities;Therapeutic exercise;Balance training;Patient/family education    PT Goals (Current goals can be found in the Care Plan section)  Acute Rehab PT Goals Patient Stated Goal: to decrease pain  PT Goal Formulation: With patient Time For Goal Achievement: 06/23/18 Potential to Achieve Goals: Good    Frequency Min 5X/week   Barriers to discharge        Co-evaluation               AM-PAC PT "6 Clicks" Daily Activity  Outcome Measure Difficulty turning over in bed (including adjusting bedclothes, sheets and blankets)?: A Lot Difficulty moving from lying on back to sitting on the side of the bed? : Unable Difficulty sitting down on and standing up from a chair with arms (e.g., wheelchair, bedside commode, etc,.)?: Unable Help needed moving to and from a bed to chair (including a wheelchair)?: A Little Help needed walking in hospital room?: A Little Help needed climbing 3-5 steps with a  railing? : A Lot 6 Click Score: 12    End of Session Equipment Utilized During Treatment: Gait belt Activity Tolerance: No increased pain;Patient limited by fatigue Patient left: in bed;with call bell/phone within reach Nurse Communication: Mobility status PT Visit Diagnosis: Unsteadiness on feet (R26.81);Other abnormalities of gait and mobility (R26.89);Muscle weakness (generalized) (M62.81);Pain Pain - part of body: (back )    Time: 8937-3428 PT Time Calculation (min) (ACUTE ONLY): 27 min   Charges:   PT Evaluation $PT Eval Low Complexity: 1 Low PT Treatments $Therapeutic Activity: 8-22 mins        Leighton Ruff, PT, DPT  Acute Rehabilitation Services  Pager: [phone] Office: [phone]   Rudean Hitt 06/09/2018, 4:58 PM

## 2018-06-09 NOTE — Anesthesia Postprocedure Evaluation (Signed)
Anesthesia Post Note  Patient: Ashley [name]  Procedure(s) Performed: Repositioning of Right Lumbar three Right lumbar  four pedicle screw with MAZOR (Right ) APPLICATION OF ROBOTIC ASSISTANCE FOR SPINAL PROCEDURE (N/A )     Patient location during evaluation: PACU Anesthesia Type: General Level of consciousness: awake and alert Pain management: pain level controlled Vital Signs Assessment: post-procedure vital signs reviewed and stable Respiratory status: spontaneous breathing, nonlabored ventilation, respiratory function stable and patient connected to nasal cannula oxygen Cardiovascular status: blood pressure returned to baseline and stable Postop Assessment: no apparent nausea or vomiting Anesthetic complications: no    Last Vitals:  Vitals:   06/09/18 1108 06/09/18 1112  BP: (!) 145/90   Pulse:  89  Resp:  14  Temp:    SpO2:  97%    Last Pain:  Vitals:   06/09/18 1112  TempSrc:   PainSc: Ashley [name]

## 2018-06-10 ENCOUNTER — Other Ambulatory Visit: Payer: Self-pay

## 2018-06-10 DIAGNOSIS — Z8701 Personal history of pneumonia (recurrent): Secondary | ICD-10-CM | POA: Diagnosis not present

## 2018-06-10 DIAGNOSIS — Z96641 Presence of right artificial hip joint: Secondary | ICD-10-CM | POA: Diagnosis present

## 2018-06-10 DIAGNOSIS — Z79899 Other long term (current) drug therapy: Secondary | ICD-10-CM | POA: Diagnosis not present

## 2018-06-10 DIAGNOSIS — Z8049 Family history of malignant neoplasm of other genital organs: Secondary | ICD-10-CM | POA: Diagnosis not present

## 2018-06-10 DIAGNOSIS — Z888 Allergy status to other drugs, medicaments and biological substances status: Secondary | ICD-10-CM | POA: Diagnosis not present

## 2018-06-10 DIAGNOSIS — Z8249 Family history of ischemic heart disease and other diseases of the circulatory system: Secondary | ICD-10-CM | POA: Diagnosis not present

## 2018-06-10 DIAGNOSIS — M62838 Other muscle spasm: Secondary | ICD-10-CM | POA: Diagnosis present

## 2018-06-10 DIAGNOSIS — T84226A Displacement of internal fixation device of vertebrae, initial encounter: Secondary | ICD-10-CM | POA: Diagnosis present

## 2018-06-10 DIAGNOSIS — M479 Spondylosis, unspecified: Secondary | ICD-10-CM | POA: Diagnosis present

## 2018-06-10 DIAGNOSIS — Z803 Family history of malignant neoplasm of breast: Secondary | ICD-10-CM | POA: Diagnosis not present

## 2018-06-10 DIAGNOSIS — M5416 Radiculopathy, lumbar region: Secondary | ICD-10-CM | POA: Diagnosis present

## 2018-06-10 DIAGNOSIS — Z96653 Presence of artificial knee joint, bilateral: Secondary | ICD-10-CM | POA: Diagnosis present

## 2018-06-10 DIAGNOSIS — I10 Essential (primary) hypertension: Secondary | ICD-10-CM | POA: Diagnosis present

## 2018-06-10 DIAGNOSIS — M170 Bilateral primary osteoarthritis of knee: Secondary | ICD-10-CM | POA: Diagnosis present

## 2018-06-10 DIAGNOSIS — M5417 Radiculopathy, lumbosacral region: Secondary | ICD-10-CM | POA: Diagnosis present

## 2018-06-10 NOTE — Progress Notes (Signed)
Postop day 1.  Patient still with significant right-sided hip flexor and quadriceps weakness.  Patient still reports some pain and dysesthesia.  Mobilizing poorly.  Not ready to go home.  Afebrile.  Vital signs are stable.  Voiding well.  Motor examination intact aside from her right proximal right lower extremity weakness.  Continue current management.  Continue efforts at mobilization.  Possible home tomorrow versus Monday

## 2018-06-10 NOTE — Progress Notes (Signed)
Physical Therapy Treatment Patient Details Name: Ashley [name] MRN: [id number] DOB: 1976-08-23 Today's Date: 06/10/2018    History of Present Illness Pt is a 42 y/o female s/p L3-4 revision of instrumentation. PMH includes R THA, bilateral TKA, HTN, and lumbar surgery.     PT Comments    Pt showing good improvement with mobility today, ambulated 250' with RW and min-guard A with icnreaed R step height and no R knee buckling. Updated d/c plan to outpt PT since pt recently  Completed HHPT after first surgery. PT will continue to follow.     Follow Up Recommendations  Supervision for mobility/OOB;Outpatient PT     Equipment Recommendations  Rolling walker with 5" wheels    Recommendations for Other Services       Precautions / Restrictions Precautions Precautions: Back Precaution Booklet Issued: No Precaution Comments: pt verbalized back precautions without cues Restrictions Weight Bearing Restrictions: No    Mobility  Bed Mobility Overal bed mobility: Needs Assistance Bed Mobility: Rolling;Sidelying to Sit;Sit to Sidelying Rolling: Supervision Sidelying to sit: Supervision;HOB elevated     Sit to sidelying: Supervision General bed mobility comments: pt able to get to EOB and back to supine with use of rails and HOB slightly elevated, needs increased time to do so.   Transfers Overall transfer level: Needs assistance Equipment used: Rolling walker (2 wheeled) Transfers: Sit to/from Stand Sit to Stand: Min guard         General transfer comment: min-guard for safety  Ambulation/Gait Ambulation/Gait assistance: Min assist;Min guard Gait Distance (Feet): 250 Feet Assistive device: Rolling walker (2 wheeled) Gait Pattern/deviations: Decreased step length - left;Decreased step length - right;Decreased dorsiflexion - right Gait velocity: Decreased  Gait velocity interpretation: 1.31 - 2.62 ft/sec, indicative of limited community ambulator General Gait Details: pt  moving RLE better today, no buckling of R knee. Pt tolerated increased distance. Worked on upright posture throughout   [employer] Rankin (Stroke Patients Only)       Balance Overall balance assessment: Needs assistance Sitting-balance support: No upper extremity supported;Feet supported Sitting balance-Leahy Scale: Fair     Standing balance support: During functional activity;No upper extremity supported Standing balance-Leahy Scale: Fair Standing balance comment: able to maintain static stance without support                            Cognition Arousal/Alertness: Awake/alert Behavior During Therapy: WFL for tasks assessed/performed Overall Cognitive Status: Within Functional Limits for tasks assessed                                        Exercises      General Comments General comments (skin integrity, edema, etc.): discussed proper posture and nutrition for optimal recovery      Pertinent Vitals/Pain Pain Assessment: 0-10 Pain Score: 6  Pain Location: back  Pain Descriptors / Indicators: Aching;Operative site guarding Pain Intervention(s): Limited activity within patient's tolerance;Monitored during session;Premedicated before session    Home Living                      Prior Function            PT Goals (current goals can now be found in the care plan section) Acute Rehab PT  Goals Patient Stated Goal: to decrease pain  PT Goal Formulation: With patient Time For Goal Achievement: 06/23/18 Potential to Achieve Goals: Good Progress towards PT goals: Progressing toward goals    Frequency    Min 5X/week      PT Plan Discharge plan needs to be updated    Co-evaluation              AM-PAC PT "6 Clicks" Daily Activity  Outcome Measure  Difficulty turning over in bed (including adjusting bedclothes, sheets and blankets)?: A Little Difficulty moving from lying  on back to sitting on the side of the bed? : A Little Difficulty sitting down on and standing up from a chair with arms (e.g., wheelchair, bedside commode, etc,.)?: A Little Help needed moving to and from a bed to chair (including a wheelchair)?: A Little Help needed walking in hospital room?: A Little Help needed climbing 3-5 steps with a railing? : A Lot 6 Click Score: 17    End of Session Equipment Utilized During Treatment: Gait belt Activity Tolerance: No increased pain;Patient tolerated treatment well Patient left: in bed;with call bell/phone within reach;with family/visitor present Nurse Communication: Mobility status PT Visit Diagnosis: Unsteadiness on feet (R26.81);Other abnormalities of gait and mobility (R26.89);Muscle weakness (generalized) (M62.81);Pain Pain - part of body: (back )     Time: 7654-6503 PT Time Calculation (min) (ACUTE ONLY): 28 min  Charges:  $Gait Training: 23-37 mins                     Edgard  Pager [phone] Office [place] 06/10/2018, 1:50 PM

## 2018-06-10 NOTE — Evaluation (Signed)
Occupational Therapy Evaluation Patient Details Name: Ashley [name] MRN: [id number] DOB: 1976-06-24 Today's Date: 06/10/2018    History of Present Illness Pt is a 42 y/o female s/p L3-4 revision of instrumentation. PMH includes R THA, bilateral TKA, HTN, and lumbar surgery.    Clinical Impression   PTA patient reports independent with mobility (son walks with her though), ADLs, and limited IADLs (cares for 51 yr old daughter). She was admitted for above and limited by pain, impaired balance, and decreased activity tolerance.  Patient completes UB ADLs with supervision, LB ADLs with supervision using AE, toilet transfers with supervision using 3:1 over toilet and RW, and grooming with supervision.  She is able to recall and adhere to back precautions without cueing, already has AE for LB bathing/dressing (educated on use of reacher for LB dressing)/ 3:1 and tub transfer bench.  She declines practice with tub bench, as is used to using at home after previous surgery.  She will have 24/7 support from son and all education has been completed.  At this time, no further OT needs have been identified.  Thank you for this referral.  OT signing off.       Follow Up Recommendations  No OT follow up;Supervision/Assistance - 24 hour    Equipment Recommendations  None recommended by OT    Recommendations for Other Services       Precautions / Restrictions Precautions Precautions: Back Precaution Booklet Issued: No Precaution Comments: pt verbalized back precautions without cues Restrictions Weight Bearing Restrictions: No      Mobility Bed Mobility Overal bed mobility: Needs Assistance Bed Mobility: Rolling;Sidelying to Sit;Sit to Sidelying Rolling: Modified independent (Device/Increase time) Sidelying to sit: Supervision;HOB elevated     Sit to sidelying: Modified independent (Device/Increase time) General bed mobility comments: seated EOB upon entry  Transfers Overall transfer level:  Needs assistance Equipment used: Rolling walker (2 wheeled) Transfers: Sit to/from Stand Sit to Stand: Supervision         General transfer comment: supervision for safety    Balance Overall balance assessment: Needs assistance Sitting-balance support: No upper extremity supported;Feet supported Sitting balance-Leahy Scale: Fair     Standing balance support: During functional activity;No upper extremity supported Standing balance-Leahy Scale: Fair Standing balance comment: able to maintain static stance without support                           ADL either performed or assessed with clinical judgement   ADL Overall ADL's : Needs assistance/impaired     Grooming: Supervision/safety;Standing   Upper Body Bathing: Supervision/ safety;Sitting   Lower Body Bathing: Supervison/ safety;With adaptive equipment;Sitting/lateral leans   Upper Body Dressing : Set up;Sitting   Lower Body Dressing: Set up;Supervision/safety;With adaptive equipment;Sit to/from stand   Toilet Transfer: Supervision/safety;Ambulation;BSC;RW;Regular Toilet(3:1 over toilet )   Toileting- Clothing Manipulation and Hygiene: Supervision/safety;Sit to/from stand;Cueing for compensatory techniques     Tub/Shower Transfer Details (indicate cue type and reason): patient reports using TTB in tub after previous surgery, patient declines need to practice transfer  Functional mobility during ADLs: Supervision/safety;Rolling walker General ADL Comments: Completed bed mobilty, ADLs, short distance mobility and toilet transfers.  Reviewed body mechanics, compensatory techniques for ADLs and precaution adherance.      Vision Baseline Vision/History: Wears glasses(or contacts) Wears Glasses: At all times Patient Visual Report: No change from baseline Vision Assessment?: No apparent visual deficits     Perception     Praxis  Pertinent Vitals/Pain Pain Assessment: 0-10 Pain Score: 6  Pain Location:  back  Pain Descriptors / Indicators: Aching;Operative site guarding Pain Intervention(s): Limited activity within patient's tolerance;Repositioned     Hand Dominance Right   Extremity/Trunk Assessment Upper Extremity Assessment Upper Extremity Assessment: Overall WFL for tasks assessed   Lower Extremity Assessment Lower Extremity Assessment: Defer to PT evaluation   Cervical / Trunk Assessment Cervical / Trunk Assessment: Other exceptions Cervical / Trunk Exceptions: s/p lumbar sx    Communication Communication Communication: No difficulties   Cognition Arousal/Alertness: Awake/alert Behavior During Therapy: WFL for tasks assessed/performed Overall Cognitive Status: Within Functional Limits for tasks assessed                                     General Comments  discussed energy conservation and safet y    Exercises     Shoulder Instructions      Home Living Family/patient expects to be discharged to:: Private residence Living Arrangements: Children(son) Available Help at Discharge: Family;Available 24 hours/day Type of Home: House Home Access: Stairs to enter [employer] of Steps: 2 Entrance Stairs-Rails: None Home Layout: One level     Bathroom Shower/Tub: Teacher, early years/pre: Handicapped height     Home Equipment: Cane - single point;Bedside commode;Tub bench;Adaptive equipment Adaptive Equipment: Reacher;Sock aid;Long-handled sponge Additional Comments: also has a 43 year old that relies on her      Prior Functioning/Environment Level of Independence: Independent        Comments: reports independent with mobilty and ADLs, cares for 52 year old daughter; limited IADLs         OT Problem List: Decreased strength;Decreased activity tolerance;Decreased range of motion;Impaired balance (sitting and/or standing);Decreased knowledge of use of DME or AE;Decreased knowledge of precautions;Pain      OT  Treatment/Interventions:      OT Goals(Current goals can be found in the care plan section) Acute Rehab OT Goals Patient Stated Goal: to decrease pain  OT Goal Formulation: With patient  OT Frequency:     Barriers to D/C:            Co-evaluation              AM-PAC PT "6 Clicks" Daily Activity     Outcome Measure Help from another person eating meals?: None Help from another person taking care of personal grooming?: None Help from another person toileting, which includes using toliet, bedpan, or urinal?: None Help from another person bathing (including washing, rinsing, drying)?: None Help from another person to put on and taking off regular upper body clothing?: None Help from another person to put on and taking off regular lower body clothing?: None 6 Click Score: 24   End of Session Equipment Utilized During Treatment: Rolling walker Nurse Communication: Mobility status  Activity Tolerance: Patient tolerated treatment well Patient left: in bed;with call bell/phone within reach  OT Visit Diagnosis: Other abnormalities of gait and mobility (R26.89);Muscle weakness (generalized) (M62.81);Pain Pain - part of body: (back)                Time: 1330-1356 OT Time Calculation (min): 26 min Charges:  OT General Charges $OT Visit: 1 Visit OT Evaluation $OT Eval Low Complexity: 1 Low OT Treatments $Self Care/Home Management : 8-22 mins  Delight Stare, OT Acute Rehabilitation Services Pager [phone] Office [phone]   Delight Stare 06/10/2018, 2:06 PM

## 2018-06-11 NOTE — Progress Notes (Signed)
Physical Therapy Treatment Patient Details Name: Ashley [name] MRN: [id number] DOB: 19-Sep-1976 Today's Date: 06/11/2018    History of Present Illness Pt is a 42 y/o female s/p L3-4 revision of instrumentation. PMH includes R THA, bilateral TKA, HTN, and lumbar surgery.     PT Comments    Continuing work on functional mobility and activity tolerance;  Session focused on progressive ambulation; Ashley [name] asked to defer stairs for next session; noting motor and strength return to RLE  Follow Up Recommendations  Supervision for mobility/OOB;Outpatient PT     Equipment Recommendations  Rolling walker with 5" wheels    Recommendations for Other Services       Precautions / Restrictions Precautions Precautions: Back Precaution Booklet Issued: No Precaution Comments: pt verbalized back precautions without cues    Mobility  Bed Mobility Overal bed mobility: Needs Assistance Bed Mobility: Rolling;Sidelying to Sit;Sit to Sidelying Rolling: Modified independent (Device/Increase time) Sidelying to sit: Supervision;HOB elevated       General bed mobility comments: Good movement getting up to sitting, slow moving; Painful by the end of walk, and she requested to sit and raise HOB to her  Transfers Overall transfer level: Needs assistance Equipment used: Rolling walker (2 wheeled) Transfers: Sit to/from Stand Sit to Stand: Supervision         General transfer comment: supervision for safety  Ambulation/Gait Ambulation/Gait assistance: Min guard Gait Distance (Feet): 190 Feet Assistive device: Rolling walker (2 wheeled) Gait Pattern/deviations: Decreased step length - right;Decreased step length - left(better dorsiflexion R) Gait velocity: Decreased    General Gait Details: pt moving RLE better today, no buckling of R knee. Pt tolerated increased distance. Worked on upright posture throughout   [employer] stair comments: pt opted to wait on stairs until  next session   Wheelchair Mobility    Modified Rankin (Stroke Patients Only)       Balance     Sitting balance-Leahy Scale: Fair       Standing balance-Leahy Scale: Fair                              Cognition Arousal/Alertness: Awake/alert Behavior During Therapy: WFL for tasks assessed/performed Overall Cognitive Status: Within Functional Limits for tasks assessed                                        Exercises      General Comments        Pertinent Vitals/Pain Pain Assessment: 0-10 Pain Score: 4  Pain Location: back  Pain Descriptors / Indicators: Aching;Operative site guarding Pain Intervention(s): Patient requesting pain meds-RN notified;Repositioned    Home Living                      Prior Function            PT Goals (current goals can now be found in the care plan section) Acute Rehab PT Goals Patient Stated Goal: to decrease pain  PT Goal Formulation: With patient Time For Goal Achievement: 06/23/18 Potential to Achieve Goals: Good Progress towards PT goals: Progressing toward goals    Frequency    Min 5X/week      PT Plan Current plan remains appropriate    Co-evaluation  AM-PAC PT "6 Clicks" Daily Activity  Outcome Measure  Difficulty turning over in bed (including adjusting bedclothes, sheets and blankets)?: A Little Difficulty moving from lying on back to sitting on the side of the bed? : A Little Difficulty sitting down on and standing up from a chair with arms (e.g., wheelchair, bedside commode, etc,.)?: A Little Help needed moving to and from a bed to chair (including a wheelchair)?: A Little Help needed walking in hospital room?: A Little Help needed climbing 3-5 steps with a railing? : A Little 6 Click Score: 18    End of Session Equipment Utilized During Treatment: Gait belt Activity Tolerance: No increased pain;Patient tolerated treatment well Patient left: in  bed;with call bell/phone within reach;with nursing/sitter in room Nurse Communication: Mobility status;Patient requests pain meds PT Visit Diagnosis: Unsteadiness on feet (R26.81);Other abnormalities of gait and mobility (R26.89);Muscle weakness (generalized) (M62.81);Pain Pain - part of body: (back)     Time: 2694-8546 PT Time Calculation (min) (ACUTE ONLY): 23 min  Charges:  $Gait Training: 23-37 mins                     Roney Marion, [place] Pager [phone] Office [phone]    Colletta [place] 06/11/2018, 12:33 PM

## 2018-06-11 NOTE — Progress Notes (Signed)
Subjective: Patient reports still sore with muscle spasms and still feeling weak.  Objective: Vital signs in last 24 hours: Temp:  [97.9 F (36.6 C)-98.7 F (37.1 C)] 98 F (36.7 C) (09/22 1316) Pulse Rate:  [72-93] 82 (09/22 1316) Resp:  [16-18] 16 (09/22 1316) BP: (121-141)/(68-80) 126/75 (09/22 1316) SpO2:  [94 %-100 %] 100 % (09/22 1316)  Intake/Output from previous day: No intake/output data recorded. Intake/Output this shift: Total I/O In: 240 [P.O.:240] Out: -   Physical Exam: Patient resting in bed.  Able to ambulate.  Dressing CDI.  Lab Results: Recent Labs    06/09/18 1929  WBC 9.8  HGB 9.7*  HCT 31.8*  PLT 215   BMET Recent Labs    06/09/18 1929  CREATININE 1.07*    Studies/Results: No results found.  Assessment/Plan: Patient wants to wait to go home until tomorrow.  Continue to mobilize today.    LOS: 2 days    Peggyann Shoals, MD 06/11/2018, 1:33 PM

## 2018-06-12 ENCOUNTER — Encounter (HOSPITAL_COMMUNITY): Payer: Self-pay | Admitting: Neurological Surgery

## 2018-06-12 ENCOUNTER — Ambulatory Visit: Payer: BLUE CROSS/BLUE SHIELD | Admitting: Rehabilitation

## 2018-06-12 MED ORDER — DEXAMETHASONE 1 MG PO TABS: ORAL_TABLET | ORAL | 0 refills | Status: DC

## 2018-06-12 MED ORDER — DIAZEPAM 5 MG PO TABS: 5.0000 mg | ORAL_TABLET | Freq: Four times a day (QID) | ORAL | 0 refills | Status: DC | PRN

## 2018-06-12 MED ORDER — OXYCODONE-ACETAMINOPHEN 10-325 MG PO TABS: 1.0000 | ORAL_TABLET | ORAL | 0 refills | Status: DC | PRN

## 2018-06-12 NOTE — Discharge Summary (Signed)
Physician Discharge Summary  Patient ID: Ashley [name] MRN: [id number] DOB/AGE: 1976-07-27 42 y.o.  Admit date: 06/09/2018 Discharge date: 06/12/2018  Admission Diagnoses: Right L4 radiculopathy secondary to loss of fixation of L3 and L4 pedicle screws.  Discharge Diagnoses: Right L4 radiculopathy secondary to loss of fixation of right L3 and L4 pedicle screw Active Problems:   Lumbosacral radiculopathy at L4   Discharged Condition: fair  Hospital Course: Patient was admitted to undergo surgical replacement of hardware on the right side at L3 and L4 with robotically place pedicle screws at those levels.  She had a severe L4 radiculopathy with weakness in the quadricep and iliopsoas on the right side.  This has improved marginally since the surgery.  Incisions are clean and dry.  Consults: None  Significant Diagnostic Studies: None  Treatments: surgery: Robotically assisted screw placement at L3 and L4 on the right, revision of fixation L3 to sacrum.  On the right.  Discharge Exam: Blood pressure 124/82, pulse 89, temperature 98.2 F (36.8 C), temperature source Oral, resp. rate 18, height 5\' 11"  (1.803 m), weight 116.2 kg, last menstrual period 06/07/2018, SpO2 100 %. Incision is clean and dry.  Station and gait are intact.  There is 3 out of 5 strength in the iliopsoas and the quad on the right side.  Disposition: Discharge disposition: 01-Home or Self Care       Discharge Instructions    Call MD for:  redness, tenderness, or signs of infection (pain, swelling, redness, odor or green/yellow discharge around incision site)   Complete by:  As directed    Call MD for:  severe uncontrolled pain   Complete by:  As directed    Call MD for:  temperature >100.4   Complete by:  As directed    Diet - low sodium heart healthy   Complete by:  As directed    Discharge instructions   Complete by:  As directed    Okay to shower. Do not apply salves or appointments to incision. No  heavy lifting with the upper extremities greater than 15 pounds. May resume driving when not requiring pain medication and patient feels comfortable with doing so.   Incentive spirometry RT   Complete by:  As directed    Increase activity slowly   Complete by:  As directed      Allergies as of 06/12/2018      Reactions   Gadolinium Derivatives Nausea And Vomiting, Other (See Comments)   Pt describes this happens every time she gets gado even with slow injection   Codeine Itching, Nausea Only, Other (See Comments)   Hydrocodone does not affect pt   Cymbalta [duloxetine Hcl] Other (See Comments)   Altered mental status      Medication List    TAKE these medications   ALPRAZolam 1 MG tablet Commonly known as:  XANAX Take 1 tablet (1 mg total) by mouth daily as needed for anxiety.   baclofen 10 MG tablet Commonly known as:  LIORESAL Take 3 tablets (30 mg total) by mouth 3 (three) times daily.   BELVIQ XR 20 MG Tb24 Generic drug:  Lorcaserin HCl ER Take 20 mg by mouth daily.   buPROPion 300 MG 24 hr tablet Commonly known as:  WELLBUTRIN XL Take 1 tablet (300 mg total) by mouth daily.   cetirizine 10 MG tablet Commonly known as:  ZYRTEC Take 10 mg by mouth daily as needed for allergies.   dexamethasone 1 MG tablet Commonly known as:  DECADRON 2 tablets twice daily for 2 days, one tablet twice daily for 2 days, one tablet daily for 2 days.   diazepam 5 MG tablet Commonly known as:  VALIUM Take 1 tablet (5 mg total) by mouth every 6 (six) hours as needed for muscle spasms.   EPINEPHrine 0.3 mg/0.3 mL Soaj injection Commonly known as:  EPI-PEN Inject 3 mLs into the muscle once.   lidocaine 5 % Commonly known as:  LIDODERM Place 1 patch onto the skin daily as needed (for pain). Remove & Discard patch within 12 hours or as directed by MD   losartan-hydrochlorothiazide 50-12.5 MG tablet Commonly known as:  HYZAAR Take 1 tablet by mouth daily.   multivitamin with  minerals tablet Take 1 tablet daily by mouth.   oxyCODONE-acetaminophen 10-325 MG tablet Commonly known as:  PERCOCET Take 1 tablet by mouth every 4 (four) hours as needed. What changed:    when to take this  reasons to take this   pregabalin 75 MG capsule Commonly known as:  LYRICA Take 1 capsule (75 mg total) by mouth 3 (three) times daily.   sertraline 100 MG tablet Commonly known as:  ZOLOFT Take 1 tablet (100 mg total) by mouth daily. What changed:  how much to take   traZODone 50 MG tablet Commonly known as:  DESYREL Take 50 mg by mouth at bedtime as needed for sleep.        SignedEarleen Newport 06/12/2018, 11:15 AM

## 2018-06-12 NOTE — Care Management Note (Signed)
Case Management Note  Patient Details  Name: Ashley [name] MRN: [id number] Date of Birth: 28-Oct-1975  Subjective/Objective:     Pt s/p lumbar surgery. She is from home with family. Pt states she has a wheelchair and walker at home. Pt denies any issues with obtaining her medications and no issues with transportation.                Action/Plan: Pt discharging home with self care. Pt has hospital f/u and transportation home. Pts son is able to provide assistance at home.   Expected Discharge Date:  06/12/18               Expected Discharge Plan:  Home/Self Care  In-House Referral:     Discharge planning Services     Post Acute Care Choice:    Choice offered to:     DME Arranged:    DME Agency:     HH Arranged:    HH Agency:     Status of Service:  Completed, signed off  If discussed at [employer] of Stay Meetings, dates discussed:    Additional Comments:  Pollie Friar, RN 06/12/2018, 11:18 AM

## 2018-06-16 ENCOUNTER — Ambulatory Visit: Payer: BLUE CROSS/BLUE SHIELD | Admitting: Rehabilitation

## 2018-06-19 ENCOUNTER — Ambulatory Visit: Payer: BLUE CROSS/BLUE SHIELD | Admitting: Rehabilitation

## 2018-06-22 ENCOUNTER — Ambulatory Visit: Payer: BLUE CROSS/BLUE SHIELD | Admitting: Rehabilitation

## 2018-06-26 ENCOUNTER — Ambulatory Visit: Payer: BLUE CROSS/BLUE SHIELD | Admitting: Rehabilitation

## 2018-06-28 ENCOUNTER — Ambulatory Visit: Payer: BLUE CROSS/BLUE SHIELD | Admitting: Physical Therapy

## 2018-07-03 ENCOUNTER — Telehealth: Payer: Self-pay | Admitting: Rehabilitation

## 2018-07-03 ENCOUNTER — Encounter: Payer: Self-pay | Admitting: Rehabilitation

## 2018-07-03 ENCOUNTER — Ambulatory Visit: Payer: BLUE CROSS/BLUE SHIELD | Admitting: Rehabilitation

## 2018-07-03 ENCOUNTER — Ambulatory Visit: Payer: BLUE CROSS/BLUE SHIELD | Attending: Neurological Surgery | Admitting: Rehabilitation

## 2018-07-03 ENCOUNTER — Other Ambulatory Visit: Payer: Self-pay

## 2018-07-03 DIAGNOSIS — M6281 Muscle weakness (generalized): Secondary | ICD-10-CM | POA: Insufficient documentation

## 2018-07-03 DIAGNOSIS — R2681 Unsteadiness on feet: Secondary | ICD-10-CM | POA: Diagnosis present

## 2018-07-03 DIAGNOSIS — R278 Other lack of coordination: Secondary | ICD-10-CM | POA: Insufficient documentation

## 2018-07-03 DIAGNOSIS — R52 Pain, unspecified: Secondary | ICD-10-CM | POA: Insufficient documentation

## 2018-07-03 NOTE — Therapy (Signed)
[facility] [address] [place], [place], [address] Phone: [phone]   Fax:  [phone]  Physical Therapy Evaluation  Patient Details  Name: ANGEE GUPTON MRN: [id number] Date of Birth: 1975-12-18 Referring Provider (PT): Dr. Kristeen Miss    Encounter Date: 07/03/2018  PT End of Session - 07/03/18 1459    Visit Number  1    Number of Visits  17    Date for PT Re-Evaluation  09/01/18    Authorization Type  BC[employer]    PT Start Time  1402    PT Stop Time  1445    PT Time Calculation (min)  43 min    Equipment Utilized During Treatment  Gait belt    Activity Tolerance  Patient tolerated treatment well    Behavior During Therapy  [facility] for tasks assessed/performed       Past Medical History:  Diagnosis Date  . Arthritis    knees, back  . 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 Complication of anesthesia   . Depression   . History of pneumonia   . Hypertension   . Lumbar spondylosis   . PCOS (polycystic ovarian syndrome)   . Pneumonia    2016  . PONV (postoperative nausea and vomiting)     Past Surgical History:  Procedure Laterality Date  . ABDOMINAL EXPOSURE N/A 02/17/2018   Procedure: ABDOMINAL EXPOSURE;  Surgeon: Rosetta Posner, MD;  Location: [facility] OR;  Service: Vascular;  Laterality: N/A;  . ANKLE SURGERY     x 2  . ANTERIOR LAT LUMBAR FUSION N/A 02/17/2018   Procedure: Lumbar three-four Lumbar four-five  Anterolateral lumbar interbody fusion;  Surgeon: Kristeen Miss, MD;  Location: [facility];  Service: Neurosurgery;  Laterality: N/A;  . ANTERIOR LUMBAR FUSION N/A 02/17/2018   Procedure: Lumbar five-Sacral one Anterior lumbar interbody fusion;  Surgeon: Kristeen Miss, MD;  Location: [facility];  Service: Neurosurgery;  Laterality: N/A;  . APPLICATION OF ROBOTIC ASSISTANCE FOR SPINAL PROCEDURE N/A 02/17/2018   Procedure: APPLICATION OF ROBOTIC ASSISTANCE FOR SPINAL PROCEDURE;  Surgeon: Kristeen Miss, MD;   Location: [facility];  Service: Neurosurgery;  Laterality: N/A;  . APPLICATION OF ROBOTIC ASSISTANCE FOR SPINAL PROCEDURE N/A 06/09/2018   Procedure: APPLICATION OF ROBOTIC ASSISTANCE FOR SPINAL PROCEDURE;  Surgeon: Kristeen Miss, MD;  Location: [facility];  Service: Neurosurgery;  Laterality: N/A;  . BACK SURGERY     2017  discectomy  . CARPAL TUNNEL RELEASE Left 07/04/2014   Procedure: LEFT CARPAL TUNNEL RELEASE;  Surgeon: Ninetta Lights, MD;  Location: [facility];  Service: Orthopedics;  Laterality: Left;  . CARPAL TUNNEL RELEASE Right 08/08/2014   Procedure: RIGHT CARPAL TUNNEL RELEASE;  Surgeon: Ninetta Lights, MD;  Location: [facility];  Service: Orthopedics;  Laterality: Right;  . DILATION AND CURETTAGE OF UTERUS    . DILATION AND EVACUATION  04/02/2011   Procedure: DILATATION AND EVACUATION (D&E);  Surgeon: Luz Lex, MD;  Location: [facility];  Service: Gynecology;  Laterality: N/A;  dvt left mid thigh  . DORSAL COMPARTMENT RELEASE Left 07/04/2014   Procedure: LEFT DEQUERVAINS;  Surgeon: Ninetta Lights, MD;  Location: [facility];  Service: Orthopedics;  Laterality: Left;  . ENDOSCOPIC PLANTAR FASCIOTOMY Left 02/01/2002  . HARDWARE REMOVAL Right 06/09/2018   Procedure: Repositioning of Right Lumbar three Right lumbar  four pedicle screw with MAZOR;  Surgeon: Kristeen Miss, MD;  Location: [facility];  Service: Neurosurgery;  Laterality: Right;  . HYSTEROSCOPY W/D&C  07/17/2010   with exc. endometrial polyps  . KNEE ARTHROSCOPY Right 03/07/2003; 01/14/2005; 08/31/2006  . KNEE ARTHROSCOPY Left 03/21/2003; 11/26/2004  . LUMBAR PERCUTANEOUS PEDICLE SCREW 3 LEVEL N/A 02/17/2018   Procedure: LUMBAR PERCUTANEOUS PEDICLE SCREW PLACEMENT LUMBAR THREE-SACRAL ONE;  Surgeon: Kristeen Miss, MD;  Location: [facility];  Service: Neurosurgery;  Laterality: N/A;  . SHOULDER ARTHROSCOPY Right   . TOE SURGERY Left 01/10/2003   claw toe correction 2nd, 3rd, 4th toes  . TOTAL HIP  ARTHROPLASTY Right 04/21/2016  . TOTAL HIP ARTHROPLASTY Right 04/21/2016   Procedure: TOTAL HIP ARTHROPLASTY ANTERIOR APPROACH;  Surgeon: Ninetta Lights, MD;  Location: [facility];  Service: Orthopedics;  Laterality: Right;  . TOTAL KNEE ARTHROPLASTY Left 01/21/2010  . TOTAL KNEE ARTHROPLASTY Right 08/26/2008    There were no vitals filed for this visit.   Subjective Assessment - 07/03/18 1403    Subjective  Reports prior lumbar spinal fusion with no strength in R LE s/p. Reports fall in July 2/2 to walking with no AD and her son helped her back up. She firmly believes this fall may have caused the screw to become misplaced creating her L4 radiculopathy. CT scan indicated screw needed to be repositioned and reports surgeon indicated nerve damage. Reports she is following back precautions. Unable to walk for prolonged periods of time. Indicates R LE is weak and feels like it may buckle. Reports difficult time performing stairs 2/2 R LE weakness. Reports she is going on a cruise in January and has concerns about whether or not she will need DME for functional assistance.     Pertinent History  Past Surgical Hx: B TKA (2009, 2011) and R THA 2017. PMH includes; OA, Morbid Obesity, Depression, HTN, Lumbar Spondylosis, Polycystic Ovarian Syndrome, Pneumonia, and PONV    Limitations  Standing;Walking;Lifting;Sitting    How long can you sit comfortably?  Sitting is uncomfortable. No amount of time specified.     How long can you stand comfortably?  about 5-10 mins    How long can you walk comfortably?  Short period of time with no time period specified    Patient Stated Goals  Reports goal of being more mobile with increased flexibility. Wants to "atleast put lotion on her feet."    Currently in Pain?  Yes    Pain Score  5     Pain Location  Leg    Pain Orientation  Right;Lower   R leg; Lower back   Pain Descriptors / Indicators  Aching;Sharp;Shooting;Tingling    Pain Type  Surgical pain    Pain Radiating  Towards   Reports experiencing spasms in her R leg "buttocks, thigh, calf" with occassional cramping    Pain Onset  More than a month ago    Pain Frequency  Constant    Aggravating Factors   activity     Pain Relieving Factors  heating pad; pain patch occassionally; TENS unit         [facility] PT Assessment - 07/03/18 1359      Assessment   Medical Diagnosis  Dx of R LE weakness 2/2 L4 pedicle screw affecting the path of the L4 nerve root causing L4 radiculopathy. Pt had surgical replacement of hardware on the right side at L3 and L4 with robotically placed pedicle screws at those levels     Referring Provider (PT)  Dr. Kristeen Miss     Onset Date/Surgical Date  06/29/18    Next MD  Visit  Follow up visit 11/6    Prior Therapy  acute, IP rehab, and HHPT      Precautions   Precautions  Fall;Back      Restrictions   Weight Bearing Restrictions  No      Balance Screen   Has the patient fallen in the past 6 months  Yes    How many times?  1    Has the patient had a decrease in activity level because of a fear of falling?   No    Is the patient reluctant to leave their home because of a fear of falling?   No      Home Environment   Living Environment  Private residence    Living Arrangements  Children    Available Help at Discharge  Family;Available 24 hours/day    Type of [place] to enter    Entrance Stairs-Number of Steps  1 then 1     Entrance Stairs-Rails  None    Home Layout  One level    Louise - 2 wheels;Wheelchair - manual;Tub bench;Grab bars - tub/shower      Prior Function   Level of Independence  Independent    Leisure  Reports she would like to get back to water aerobics; being able to sit to watch her daughters gymnastics; increase her walking tolerance and endurance.       Cognition   Overall Cognitive Status  Within Functional Limits for tasks assessed      Sensation   Light Touch  Impaired Detail    Light Touch  Impaired Details  Impaired RLE   Reports diminished sensation with sx "pins & needles"      Coordination   Gross Motor Movements are Fluid and Coordinated  No    Heel Shin Test  Limited on L LE 2/2 to tight hip external rotators; limited R LE 2/2 weakness       Posture/Postural Control   Posture/Postural Control  --      ROM / Strength   AROM / PROM / Strength  AROM;Strength      AROM   Overall AROM   Deficits    Overall AROM Comments  Pt demonstrates increased hip external rotator tightness and reports inability to "touch her toes" which may be 2/2 to hamstring tightness. Need to formally assess next session      Strength   Overall Strength  Deficits    Overall Strength Comments  2/5 R hip flexion; 3/5 R knee extension; 2/5 R knee flexion; 4/5 R DF, Hip ABD, Hip ADD.  L LE assessed to be WFL's via MMT in sitting.       Transfers   Transfers  Sit to Stand;Stand to [employer] Transfers    Sit to Stand  5: Supervision    Five time sit to stand comments   22.75 seconds with BUE support from standard height chair     Stand to Sit  5: Supervision    Stand Pivot Transfers  5: Supervision      Ambulation/Gait   Ambulation/Gait  Yes    Ambulation/Gait Assistance  5: Supervision    Ambulation Distance (Feet)  40 Feet    Assistive device  None    Gait Pattern  Step-through pattern;Decreased hip/knee flexion - right;Decreased arm swing - right;Decreased arm swing - left;Decreased stride length;Decreased hip/knee flexion - left;Decreased dorsiflexion - right;Decreased dorsiflexion - left;Wide base of  support;Decreased trunk rotation;Decreased weight shift to right    Ambulation Surface  Level;Indoor    Gait velocity  2.27 ft/sec with no AD indicative of limited community ambulation       Standardized Balance Assessment   Standardized Balance Assessment  Berg Balance Test      Berg Balance Test   Sit to Stand  Able to stand  independently using hands    Standing Unsupported  Able to  stand 2 minutes with supervision    Sitting with Back Unsupported but Feet Supported on Floor or Stool  Able to sit safely and securely 2 minutes    Stand to Sit  Uses backs of legs against chair to control descent    Transfers  Able to transfer safely, definite need of hands    Standing Unsupported with Eyes Closed  Able to stand 10 seconds with supervision    Standing Ubsupported with Feet Together  Able to place feet together independently and stand for 1 minute with supervision    From Standing, Reach Forward with Outstretched Arm  Can reach forward >5 cm safely (2")    From Standing Position, Pick up Object from Floor  Unable to try/needs assist to keep balance   unable 2/2 back precautions   From Standing Position, Turn to Look Behind Over each Shoulder  Needs assist to keep from losing balance and falling   Unable 2/2 back precautions   Turn 360 Degrees  Able to turn 360 degrees safely but slowly    Standing Unsupported, Alternately Place Feet on Step/Stool  Needs assistance to keep from falling or unable to try    Standing Unsupported, One Foot in Front  Able to plae foot ahead of the other independently and hold 30 seconds    Standing on One Leg  Able to lift leg independently and hold equal to or more than 3 seconds    Total Score  30    Berg comment:  30/56 indicating high risk for falls.                 Objective measurements completed on examination: See above findings.              PT Education - 07/03/18 1459    Education Details  Therapist provided education regarding evaluation clinical findings and POC moving forward.     Person(s) Educated  Patient    Methods  Explanation    Comprehension  Verbalized understanding       PT Short Term Goals - 07/03/18 1501      PT SHORT TERM GOAL #1   Title  Pt will initiate HEP in order to indicate improved functional mobility and decreased fall risk.    Time  4    Period  Weeks    Status  New    Target Date   08/02/18      PT SHORT TERM GOAL #2   Title  Pt will improve gait speed to >/= 2.57 ft/sec with LRAD in order to indicate decreased fall risk     Baseline  10/14: 2.27 ft/ sec with no AD    Time  4    Period  Weeks    Status  New    Target Date  08/02/18      PT SHORT TERM GOAL #3   Title  Pt will perform 5TSS </=17.57 secs with single UE support only in order to indicate decreased fall risk and improved functional strength.  Baseline  10/14: 22.74 from standard height chair with B UE assist     Time  4    Period  Weeks    Status  New    Target Date  08/02/18      PT SHORT TERM GOAL #4   Title  Pt will improve BERG balance score to >/=34/56 in order to indicate decreased fall risk.     Baseline  10/14: 30/56 indicative of high fall risk potential     Time  4    Period  Weeks    Status  New    Target Date  08/02/18      PT SHORT TERM GOAL #5   Title  Pt will negotiate up/down 8 steps with bilateral rails with supervision in order to indicate improved LE strength    Time  4    Period  Weeks    Status  New    Target Date  08/02/18      Additional Short Term Goals   Additional Short Term Goals  Yes      PT SHORT TERM GOAL #6   Title  Pt will ambulate 300' on uneven surfaces, ramps, curbs, inclines, demonstrating ability to vary gait speed when cued using LRAD and therapist providing supervision to improve safety with community distances     Time  4    Period  Weeks    Status  New    Target Date  08/02/18      PT SHORT TERM GOAL #7   Title  Pt will participate in 6 min walk test to assess baseline endurance with functional mobility     Time  4    Period  Weeks    Status  New    Target Date  08/02/18        PT Long Term Goals - 07/03/18 1506      PT LONG TERM GOAL #1   Title  Pt will be independent with final HEP in order to indicate improved functional mobilty and decreased fall risk.     Time  8    Period  Weeks    Status  New    Target Date  09/01/18       PT LONG TERM GOAL #2   Title  Pt will improve gait speed to >/=3.17 ft/sec w/ LRAD in order to indicate decreased fall risk and safe community ambulator.     Period  Weeks    Status  New    Target Date  09/01/18      PT LONG TERM GOAL #3   Title  Pt will perform 5TSS in </=14 secs without UE support in order to indicate decreased fall risk and improved functional strength.     Time  8    Period  Weeks    Status  New    Target Date  09/01/18      PT LONG TERM GOAL #4   Title  Pt will improve BERG balance score to >/=38/56 in order to indicate decreased fall risk.      Time  8    Period  Weeks    Status  New    Target Date  09/01/18      PT LONG TERM GOAL #5   Title  Pt will negotiate 8 steps with single HR and LRAD utilizing reciprocal stepping technique at Mod I level to increase safety with community accessibility.     Time  8    Period  Weeks    Status  New    Target Date  09/01/18      Additional Long Term Goals   Additional Long Term Goals  Yes      PT LONG TERM GOAL #6   Title  Pt will ambulate 500 feet outdoors with LRAD navigating uneven terrain, curbs, ramps, and demonstrating ability to vary gait speed at Mod I level to improve functional mobility.     Time  8    Period  Weeks    Status  New      PT LONG TERM GOAL #7   Title  Pt will improve 6 min walk test by 50' indicating improvement with functional endurance     Time  8    Period  Weeks    Status  New    Target Date  09/01/18             Plan - 07/03/18 1511    Clinical Impression Statement  Pt is a 42 year old female referred to Neuro OPPT for evaluation of functional mobility s/p surgical replacement of hardware on the right side at L3 and L4 with robotically placed pedicle screws at those levels. Pt previously experienced surgical decompression of L3/4, L4/5 with anterior exposure of L5/S1 and percutaneous fixation of L3-S1 on 02/17/18. Following surgery and rehabilitation, Pt noted severe R LE  weakness 2/2 L4 pedicle screw affecting the path of the L4 nerve root causing L4 radiculopathy thus requiring surgical replacement of hardware. Pt's PMH is significant for the following: OA, Morbid Obesity, Depression, HTN, Lumbar Spondylosis, Polycystic Ovarian Syndrome, Pneumonia, and PONV. The following deficits were noted during pt's exam: Pt demonstrates R lower extremity weakness with special consideration to right iliopsoas quadricep, and hamstring muscle groups. Pt demonstrates diminished sensation to R LE with inability to perform coordination testing 2/2 R LE weakness and tight hip external rotators bilaterally. Pt's gait speed of 2.27 ft/sec with no AD is indicative of limited community ambulation. Pt's Berg score of 30/56 indicates she is considered a high fall risk. Pt's 5x STS time of 22.75 seconds from standard height chair with upper extremity assist indicates she is considered a high fall risk.  Pt would benefit from skilled PT to address these impairments and functional limitations to maximize functional mobility independence and reduce falls risk.     History and Personal Factors relevant to plan of care:  OA, Morbid Obesity, Depression, HTN, Lumbar Spondylosis, Polycystic Ovarian Syndrome, Pneumonia, and PONV. B TKA & R THA. Pt unable to perform stairs 2/2 to R LE weakness and reports limitations with community ambulation distances     Clinical Presentation  Evolving    Clinical Presentation due to:  see above     Clinical Decision Making  Moderate    Rehab Potential  Good    Clinical Impairments Affecting Rehab Potential  Pt is motivated to improve. Strength gains may be limited 2/2 to nerve regeneration process     PT Frequency  2x / week    PT Duration  8 weeks    PT Treatment/Interventions  ADLs/Self Care Home Management;Aquatic Therapy;DME Instruction;Gait training;Stair training;Functional mobility training;Therapeutic activities;Therapeutic exercise;Balance training;Neuromuscular  re-education;Patient/family education;Electrical Stimulation;Passive range of motion;Energy conservation    PT Next Visit Plan  Pt may be bringing in NMES unit, assess gait with bioness, 6 min walk test, initiate HEP for strength and balance, NMR education    PT Home Exercise Plan  tbd     Consulted and Agree with  Plan of Care  Patient       Patient will benefit from skilled therapeutic intervention in order to improve the following deficits and impairments:  Abnormal gait, Decreased activity tolerance, Decreased balance, Decreased coordination, Decreased endurance, Decreased mobility, Decreased range of motion, Decreased strength, Impaired perceived functional ability, Impaired flexibility, Impaired sensation, Pain, Decreased knowledge of use of DME, Decreased knowledge of precautions, Obesity  Visit Diagnosis: Muscle weakness (generalized)  Unsteadiness on feet  Pain  Other lack of coordination     Problem List Patient Active Problem List   Diagnosis Date Noted  . Lumbosacral radiculopathy at L4 06/09/2018  . Generalized anxiety disorder   . Pain   . Fall   . Labile blood pressure   . Neuropathic pain   . Therapeutic opioid induced constipation   . Urinary retention   . Lumbar radiculopathy 02/21/2018  . PCOS (polycystic ovarian syndrome)   . Morbid obesity ([place])   . Depression   . Generalized OA   . Postoperative pain   . Acute blood loss anemia   . Status post surgery 02/17/2018  . Lumbar radiculopathy, chronic 02/17/2018  . Primary localized osteoarthritis of right hip 04/21/2016  . Sinusitis 06/25/2015  . Cough 06/25/2015  . Essential hypertension 04/03/2015  . Dehydration 04/03/2015  . CAP (community acquired pneumonia) 03/31/2015  . Right lower lobe pneumonia ([place]) 03/31/2015  . Fever 03/31/2015  . Pregnancy 04/29/2014  . Miscarriage   . Incomplete miscarriage 04/02/2011    Class: Acute  . Deep vein thrombosis (DVT) ([place]) 04/02/2011    Class: Chronic     Floreen Comber, SPT 07/03/2018, 3:19 PM  [facility] [address] [place], [place], [address] Phone: [phone]   Fax:  [phone]  Name: LEILANA MCQUIRE MRN: [id number] Date of Birth: March 06, 1976

## 2018-07-03 NOTE — Telephone Encounter (Signed)
Dr. Ellene Route,    I evaluated Mrs. Ashley [name] today at [facility] OP neuro for PT.  Note that she is unaware of any remaining back precautions s/p surgery and revision.  Does she have any restrictions that we should be aware of? Lifting/bending/arching, weight limits, etc?  Thanks,  Cameron Sprang, PT, MPT [facility] [address] [place], [place], [address] Phone: [phone]   Fax:  [phone] 07/03/18, 4:13 PM

## 2018-07-03 NOTE — Therapy (Signed)
[facility] [address] [place], [place], [address] Phone: [phone]   Fax:  [phone]  Patient Details  Name: Ashley [name] MRN: [id number] Date of Birth: [birth year] Referring Provider:  No ref. provider found  Encounter Date: 07/03/2018   PHYSICAL THERAPY DISCHARGE SUMMARY  Visits from Start of Care: 1  Current functional level related to goals / functional outcomes: Unsure as she did not return for follow up visit after evaluation.  Note that she has had a revision surgery to adjust hardware and returns to OP PT today.    Remaining deficits: See previous LTGs from 04/14/18   Education / Equipment: n/a  Plan: Patient agrees to discharge.  Patient goals were not met. Patient is being discharged due to not returning since the last visit.  ?????        Cameron Sprang, PT, MPT [facility] [address] [place], [place], [address] Phone: [phone]   Fax:  [phone] 07/03/18, 4:01 PM

## 2018-07-05 NOTE — Telephone Encounter (Signed)
Patient has no restrictions regarding activity at this time.  Can participate in PT fully.

## 2018-07-11 ENCOUNTER — Encounter: Payer: Self-pay | Admitting: Rehabilitation

## 2018-07-11 ENCOUNTER — Ambulatory Visit: Payer: BLUE CROSS/BLUE SHIELD | Admitting: Rehabilitation

## 2018-07-11 DIAGNOSIS — R2681 Unsteadiness on feet: Secondary | ICD-10-CM

## 2018-07-11 DIAGNOSIS — M6281 Muscle weakness (generalized): Secondary | ICD-10-CM | POA: Diagnosis not present

## 2018-07-11 NOTE — Patient Instructions (Signed)
Access Code: [email or web address]  URL: [email or web address]  Date: 07/11/2018  Prepared by: Floreen Comber   Exercises  Seated Hamstring Stretch - 3 sets - 30 hold - 1x daily - 7x weekly  Seated Scapular Retraction - 10 reps - 2 sets - 2-3 hold - 1x daily - 7x weekly  Hip Flexor Stretch at Edge of Bed - 3 sets - 30 hold - 1x daily - 7x weekly

## 2018-07-11 NOTE — Therapy (Signed)
[facility] [address] [place], [place], [address] Phone: [phone]   Fax:  [phone]  Physical Therapy Treatment  Patient Details  Name: Ashley [name] MRN: [id number] Date of Birth: [birth year] Referring Provider (PT): Dr. Kristeen Miss    Encounter Date: 07/11/2018  PT End of Session - 07/11/18 1313    Visit Number  2    Number of Visits  17    Date for PT Re-Evaluation  09/01/18    Authorization Type  [employer]-Has 22 visits remaining    Authorization - Visit Number  2    Authorization - Number of Visits  22    PT Start Time  1937    PT Stop Time  1230    PT Time Calculation (min)  45 min    Activity Tolerance  Patient tolerated treatment well    Behavior During Therapy  [facility] for tasks assessed/performed       Past Medical History:  Diagnosis Date  . Arthritis    knees, back  . 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 Complication of anesthesia   . Depression   . History of pneumonia   . Hypertension   . Lumbar spondylosis   . PCOS (polycystic ovarian syndrome)   . Pneumonia    2016  . PONV (postoperative nausea and vomiting)     Past Surgical History:  Procedure Laterality Date  . ABDOMINAL EXPOSURE N/A 02/17/2018   Procedure: ABDOMINAL EXPOSURE;  Surgeon: Rosetta Posner, MD;  Location: [facility] OR;  Service: Vascular;  Laterality: N/A;  . ANKLE SURGERY     x 2  . ANTERIOR LAT LUMBAR FUSION N/A 02/17/2018   Procedure: Lumbar three-four Lumbar four-five  Anterolateral lumbar interbody fusion;  Surgeon: Kristeen Miss, MD;  Location: [place];  Service: Neurosurgery;  Laterality: N/A;  . ANTERIOR LUMBAR FUSION N/A 02/17/2018   Procedure: Lumbar five-Sacral one Anterior lumbar interbody fusion;  Surgeon: Kristeen Miss, MD;  Location: [place];  Service: Neurosurgery;  Laterality: N/A;  . APPLICATION OF ROBOTIC ASSISTANCE FOR SPINAL PROCEDURE N/A 02/17/2018   Procedure: APPLICATION OF ROBOTIC ASSISTANCE FOR  SPINAL PROCEDURE;  Surgeon: Kristeen Miss, MD;  Location: [place];  Service: Neurosurgery;  Laterality: N/A;  . APPLICATION OF ROBOTIC ASSISTANCE FOR SPINAL PROCEDURE N/A 06/09/2018   Procedure: APPLICATION OF ROBOTIC ASSISTANCE FOR SPINAL PROCEDURE;  Surgeon: Kristeen Miss, MD;  Location: [facility];  Service: Neurosurgery;  Laterality: N/A;  . BACK SURGERY     2017  discectomy  . CARPAL TUNNEL RELEASE Left 07/04/2014   Procedure: LEFT CARPAL TUNNEL RELEASE;  Surgeon: Ninetta Lights, MD;  Location: [facility];  Service: Orthopedics;  Laterality: Left;  . CARPAL TUNNEL RELEASE Right 08/08/2014   Procedure: RIGHT CARPAL TUNNEL RELEASE;  Surgeon: Ninetta Lights, MD;  Location: [facility];  Service: Orthopedics;  Laterality: Right;  . DILATION AND CURETTAGE OF UTERUS    . DILATION AND EVACUATION  04/02/2011   Procedure: DILATATION AND EVACUATION (D&E);  Surgeon: Luz Lex, MD;  Location: [facility];  Service: Gynecology;  Laterality: N/A;  dvt left mid thigh  . DORSAL COMPARTMENT RELEASE Left 07/04/2014   Procedure: LEFT DEQUERVAINS;  Surgeon: Ninetta Lights, MD;  Location: [facility];  Service: Orthopedics;  Laterality: Left;  . ENDOSCOPIC PLANTAR FASCIOTOMY Left 02/01/2002  . HARDWARE REMOVAL Right 06/09/2018   Procedure: Repositioning of Right Lumbar three Right lumbar  four pedicle screw with MAZOR;  Surgeon: Kristeen Miss, MD;  Location: [facility];  Service: Neurosurgery;  Laterality: Right;  . HYSTEROSCOPY W/D&C  07/17/2010   with exc. endometrial polyps  . KNEE ARTHROSCOPY Right 03/07/2003; 01/14/2005; 08/31/2006  . KNEE ARTHROSCOPY Left 03/21/2003; 11/26/2004  . LUMBAR PERCUTANEOUS PEDICLE SCREW 3 LEVEL N/A 02/17/2018   Procedure: LUMBAR PERCUTANEOUS PEDICLE SCREW PLACEMENT LUMBAR THREE-SACRAL ONE;  Surgeon: Kristeen Miss, MD;  Location: [facility];  Service: Neurosurgery;  Laterality: N/A;  . SHOULDER ARTHROSCOPY Right   . TOE SURGERY Left 01/10/2003   claw toe  correction 2nd, 3rd, 4th toes  . TOTAL HIP ARTHROPLASTY Right 04/21/2016  . TOTAL HIP ARTHROPLASTY Right 04/21/2016   Procedure: TOTAL HIP ARTHROPLASTY ANTERIOR APPROACH;  Surgeon: Ninetta Lights, MD;  Location: [facility];  Service: Orthopedics;  Laterality: Right;  . TOTAL KNEE ARTHROPLASTY Left 01/21/2010  . TOTAL KNEE ARTHROPLASTY Right 08/26/2008    There were no vitals filed for this visit.  Subjective Assessment - 07/11/18 1152    Subjective  Reports hearing loud pop in her back on Sunday and now experiences burning sensation which she attributes to bending forward to put her socks on. Reports intermittant burning sensation to R lower back region ever since hearing loud pop and uses heating pad for pain relief.  Reports neuropathic pain does not radiate down leg. Reports she follows up with her doctor 11/6.     Pertinent History  Past Surgical Hx: B TKA (2009, 2011) and R THA 2017. PMH includes; OA, Morbid Obesity, Depression, HTN, Lumbar Spondylosis, Polycystic Ovarian Syndrome, Pneumonia, and PONV    Limitations  Standing;Walking;Lifting;Sitting    How long can you sit comfortably?  Sitting is uncomfortable. No amount of time specified.     How long can you stand comfortably?  about 5-10 mins    How long can you walk comfortably?  Short period of time with no time period specified    Patient Stated Goals  Reports goal of being more mobile with increased flexibility. Wants to "atleast put lotion on her feet."    Currently in Pain?  Yes    Pain Score  5     Pain Location  Back    Pain Orientation  Lower;Mid;Right;Left    Pain Descriptors / Indicators  Aching;Burning;Shooting    Pain Type  Surgical pain    Pain Onset  More than a month ago    Pain Frequency  Constant    Pain Relieving Factors  heating pad        Skilled Physical Therapy Intervention:   Patient verbalizes and demonstrates understanding of the below LE stretching exercises to improve functional mobility:    Exercises   Seated Hamstring Stretch - 3 sets - 30 hold - 1x daily - 7x weekly  Seated Scapular Retraction - 10 reps - 2 sets - 2-3 hold - 1x daily - 7x weekly  Hip Flexor Stretch at Edge of Bed - 3 sets - 30 hold - 1x daily - 7x weekly      [facility] Adult PT Treatment/Exercise - 07/11/18 1305      Transfers   Transfers  Sit to Stand;Stand to Sit;Stand Pivot Transfers    Sit to Stand  5: Supervision    Stand to Sit  5: Supervision    Stand Pivot Transfers  5: Supervision      Ambulation/Gait   Ambulation/Gait  Yes    Ambulation/Gait Assistance  5: Supervision    Ambulation/Gait Assistance Details  Pt performs x2 ambulation trials with  Bioness upper and lower thigh cuff applied to R LE to increase quad activation during gait cycle. Pt demonstrates improvement in quad activation during closed and open chain gait. Pt performs initial trials with no AD and 2nd attempt with rollator for increased assistance during gait.     Ambulation Distance (Feet)  115 Feet   x2 trials   Assistive device  None;4-wheeled walker    Gait Pattern  Step-through pattern;Decreased hip/knee flexion - right;Decreased arm swing - right;Decreased arm swing - left;Decreased stride length;Decreased hip/knee flexion - left;Decreased dorsiflexion - right;Decreased dorsiflexion - left;Wide base of support;Decreased trunk rotation;Decreased weight shift to right    Ambulation Surface  Level;Indoor      Modalities   Modalities  Teacher, English as a foreign language Location  R LE quad and tibialis anterior. Only upper cuff providing stimulation to quad.     Electrical Stimulation Action  open and closed chain quad activation during gait cycle    Electrical Stimulation Parameters  see parameters on tablet 2    Electrical Stimulation Goals  Strength;Neuromuscular facilitation             PT Education - 07/11/18 1312    Education Details  Therapist provided education regarding implications  for Bioness, safety during ambulation trials with rollator, and POC moving forward with implementing her FES device into strengthening exercises to establish an HEP.    Person(s) Educated  Patient    Methods  Explanation    Comprehension  Verbalized understanding       PT Short Term Goals - 07/03/18 1501      PT SHORT TERM GOAL #1   Title  Pt will initiate HEP in order to indicate improved functional mobility and decreased fall risk.    Time  4    Period  Weeks    Status  New    Target Date  08/02/18      PT SHORT TERM GOAL #2   Title  Pt will improve gait speed to >/= 2.57 ft/sec with LRAD in order to indicate decreased fall risk     Baseline  10/14: 2.27 ft/ sec with no AD    Time  4    Period  Weeks    Status  New    Target Date  08/02/18      PT SHORT TERM GOAL #3   Title  Pt will perform 5TSS </=17.57 secs with single UE support only in order to indicate decreased fall risk and improved functional strength.      Baseline  10/14: 22.74 from standard height chair with B UE assist     Time  4    Period  Weeks    Status  New    Target Date  08/02/18      PT SHORT TERM GOAL #4   Title  Pt will improve BERG balance score to >/=34/56 in order to indicate decreased fall risk.     Baseline  10/14: 30/56 indicative of high fall risk potential     Time  4    Period  Weeks    Status  New    Target Date  08/02/18      PT SHORT TERM GOAL #5   Title  Pt will negotiate up/down 8 steps with bilateral rails with supervision in order to indicate improved LE strength    Time  4    Period  Weeks    Status  New  Target Date  08/02/18      Additional Short Term Goals   Additional Short Term Goals  Yes      PT SHORT TERM GOAL #6   Title  Pt will ambulate 300' on uneven surfaces, ramps, curbs, inclines, demonstrating ability to vary gait speed when cued using LRAD and therapist providing supervision to improve safety with community distances     Time  4    Period  Weeks    Status   New    Target Date  08/02/18      PT SHORT TERM GOAL #7   Title  Pt will participate in 6 min walk test to assess baseline endurance with functional mobility     Time  4    Period  Weeks    Status  New    Target Date  08/02/18        PT Long Term Goals - 07/03/18 1506      PT LONG TERM GOAL #1   Title  Pt will be independent with final HEP in order to indicate improved functional mobilty and decreased fall risk.     Time  8    Period  Weeks    Status  New    Target Date  09/01/18      PT LONG TERM GOAL #2   Title  Pt will improve gait speed to >/=3.17 ft/sec w/ LRAD in order to indicate decreased fall risk and safe community ambulator.     Period  Weeks    Status  New    Target Date  09/01/18      PT LONG TERM GOAL #3   Title  Pt will perform 5TSS in </=14 secs without UE support in order to indicate decreased fall risk and improved functional strength.     Time  8    Period  Weeks    Status  New    Target Date  09/01/18      PT LONG TERM GOAL #4   Title  Pt will improve BERG balance score to >/=38/56 in order to indicate decreased fall risk.      Time  8    Period  Weeks    Status  New    Target Date  09/01/18      PT LONG TERM GOAL #5   Title  Pt will negotiate 8 steps with single HR and LRAD utilizing reciprocal stepping technique at Mod I level to increase safety with community accessibility.     Time  8    Period  Weeks    Status  New    Target Date  09/01/18      Additional Long Term Goals   Additional Long Term Goals  Yes      PT LONG TERM GOAL #6   Title  Pt will ambulate 500 feet outdoors with LRAD navigating uneven terrain, curbs, ramps, and demonstrating ability to vary gait speed at Mod I level to improve functional mobility.     Time  8    Period  Weeks    Status  New      PT LONG TERM GOAL #7   Title  Pt will improve 6 min walk test by 50' indicating improvement with functional endurance     Time  8    Period  Weeks    Status  New    Target  Date  09/01/18            Plan -  07/11/18 1313    Clinical Impression Statement  Skilled intervention focused establishment of LE stretching exercises to improve flexibility and ambulation trials with Bioness upper cuff to increase quad activation during gait. Pt demonstrates significant tightness to bilateral LE quadriceps and hamstrings with improvement s/p implementation of stretching exercises. Patient demonstrates improvement in quad activation with upper bioness cuff applied to RLE. Pt reports preference to rollator during ambulation trials and may benefit from device to improve endurance during community ambulation. Pt will continue to benefit from physical therapy to address strengthening, balance, and functional mobility deficits.      Rehab Potential  Good    Clinical Impairments Affecting Rehab Potential  Pt is motivated to improve. Strength gains may be limited 2/2 to nerve regeneration process     PT Frequency  2x / week    PT Duration  8 weeks    PT Treatment/Interventions  ADLs/Self Care Home Management;Aquatic Therapy;DME Instruction;Gait training;Stair training;Functional mobility training;Therapeutic activities;Therapeutic exercise;Balance training;Neuromuscular re-education;Patient/family education;Electrical Stimulation;Passive range of motion;Energy conservation    PT Next Visit Plan  6 min walk test & establish goals, strengthening HEP with her NMES unit, balance, gait training on level/ compliant surfaces with and without rollator, stretches for trap tightness/ manual therapy    PT Home Exercise Plan   [id number]     Consulted and Agree with Plan of Care  Patient       Patient will benefit from skilled therapeutic intervention in order to improve the following deficits and impairments:  Abnormal gait, Decreased activity tolerance, Decreased balance, Decreased coordination, Decreased endurance, Decreased mobility, Decreased range of motion, Decreased strength, Impaired  perceived functional ability, Impaired flexibility, Impaired sensation, Pain, Decreased knowledge of use of DME, Decreased knowledge of precautions, Obesity  Visit Diagnosis: Muscle weakness (generalized)  Unsteadiness on feet     Problem List Patient Active Problem List   Diagnosis Date Noted  . Lumbosacral radiculopathy at L4 06/09/2018  . Generalized anxiety disorder   . Pain   . Fall   . Labile blood pressure   . Neuropathic pain   . Therapeutic opioid induced constipation   . Urinary retention   . Lumbar radiculopathy 02/21/2018  . PCOS (polycystic ovarian syndrome)   . Morbid obesity ([place])   . Depression   . Generalized OA   . Postoperative pain   . Acute blood loss anemia   . Status post surgery 02/17/2018  . Lumbar radiculopathy, chronic 02/17/2018  . Primary localized osteoarthritis of right hip 04/21/2016  . Sinusitis 06/25/2015  . Cough 06/25/2015  . Essential hypertension 04/03/2015  . Dehydration 04/03/2015  . CAP (community acquired pneumonia) 03/31/2015  . Right lower lobe pneumonia ([place]) 03/31/2015  . Fever 03/31/2015  . Pregnancy 04/29/2014  . Miscarriage   . Incomplete miscarriage 04/02/2011    Class: Acute  . 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

## 2018-07-14 ENCOUNTER — Ambulatory Visit: Payer: BLUE CROSS/BLUE SHIELD | Admitting: Rehabilitation

## 2018-07-14 ENCOUNTER — Encounter: Payer: Self-pay | Admitting: Rehabilitation

## 2018-07-14 ENCOUNTER — Telehealth: Payer: Self-pay | Admitting: Rehabilitation

## 2018-07-14 ENCOUNTER — Other Ambulatory Visit: Payer: Self-pay | Admitting: Obstetrics and Gynecology

## 2018-07-14 DIAGNOSIS — M6281 Muscle weakness (generalized): Secondary | ICD-10-CM

## 2018-07-14 DIAGNOSIS — R2681 Unsteadiness on feet: Secondary | ICD-10-CM

## 2018-07-14 DIAGNOSIS — Z1231 Encounter for screening mammogram for malignant neoplasm of breast: Secondary | ICD-10-CM

## 2018-07-14 NOTE — Telephone Encounter (Signed)
Dr. Ellene Route,   As you are aware, I am seeing [name] at [facility].  She is doing great, but would benefit from a rollator in order to ambulate for longer distances due to weakness and poor endurance.  Please write order in epic work que for rollator.    Thanks,  Cameron Sprang, PT, MPT [facility] [address] [place], [place], [address] Phone: [phone]   Fax:  [phone] 07/14/18, 3:04 PM

## 2018-07-14 NOTE — Therapy (Signed)
[facility] [address] [place], [place], [address] Phone: [phone]   Fax:  [phone]  Physical Therapy Treatment  Patient Details  Name: Ashley [name] MRN: [id number] Date of Birth: [birth year] Referring Provider (PT): Dr. Kristeen Miss    Encounter Date: 07/14/2018  PT End of Session - 07/14/18 1313    Visit Number  3    Number of Visits  17    Date for PT Re-Evaluation  09/01/18    Authorization Type  [id number] 22 visits remaining    Authorization - Visit Number  3    Authorization - Number of Visits  22    PT Start Time  1017    PT Stop Time  1230    PT Time Calculation (min)  45 min    Activity Tolerance  Patient tolerated treatment well    Behavior During Therapy  [facility] for tasks assessed/performed       Past Medical History:  Diagnosis Date  . Arthritis    knees, back  . 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 Complication of anesthesia   . Depression   . History of pneumonia   . Hypertension   . Lumbar spondylosis   . PCOS (polycystic ovarian syndrome)   . Pneumonia    2016  . PONV (postoperative nausea and vomiting)     Past Surgical History:  Procedure Laterality Date  . ABDOMINAL EXPOSURE N/A 02/17/2018   Procedure: ABDOMINAL EXPOSURE;  Surgeon: Rosetta Posner, MD;  Location: [facility];  Service: Vascular;  Laterality: N/A;  . ANKLE SURGERY     x 2  . ANTERIOR LAT LUMBAR FUSION N/A 02/17/2018   Procedure: Lumbar three-four Lumbar four-five  Anterolateral lumbar interbody fusion;  Surgeon: Kristeen Miss, MD;  Location: [facility];  Service: Neurosurgery;  Laterality: N/A;  . ANTERIOR LUMBAR FUSION N/A 02/17/2018   Procedure: Lumbar five-Sacral one Anterior lumbar interbody fusion;  Surgeon: Kristeen Miss, MD;  Location: [facility];  Service: Neurosurgery;  Laterality: N/A;  . APPLICATION OF ROBOTIC ASSISTANCE FOR SPINAL PROCEDURE N/A 02/17/2018   Procedure: APPLICATION OF ROBOTIC ASSISTANCE FOR  SPINAL PROCEDURE;  Surgeon: Kristeen Miss, MD;  Location: [facility];  Service: Neurosurgery;  Laterality: N/A;  . APPLICATION OF ROBOTIC ASSISTANCE FOR SPINAL PROCEDURE N/A 06/09/2018   Procedure: APPLICATION OF ROBOTIC ASSISTANCE FOR SPINAL PROCEDURE;  Surgeon: Kristeen Miss, MD;  Location: [facility];  Service: Neurosurgery;  Laterality: N/A;  . BACK SURGERY     2017  discectomy  . CARPAL TUNNEL RELEASE Left 07/04/2014   Procedure: LEFT CARPAL TUNNEL RELEASE;  Surgeon: Ninetta Lights, MD;  Location: [facility];  Service: Orthopedics;  Laterality: Left;  . CARPAL TUNNEL RELEASE Right 08/08/2014   Procedure: RIGHT CARPAL TUNNEL RELEASE;  Surgeon: Ninetta Lights, MD;  Location: [facility];  Service: Orthopedics;  Laterality: Right;  . DILATION AND CURETTAGE OF UTERUS    . DILATION AND EVACUATION  04/02/2011   Procedure: DILATATION AND EVACUATION (D&E);  Surgeon: Luz Lex, MD;  Location: [facility];  Service: Gynecology;  Laterality: N/A;  dvt left mid thigh  . DORSAL COMPARTMENT RELEASE Left 07/04/2014   Procedure: LEFT DEQUERVAINS;  Surgeon: Ninetta Lights, MD;  Location: [facility];  Service: Orthopedics;  Laterality: Left;  . ENDOSCOPIC PLANTAR FASCIOTOMY Left 02/01/2002  . HARDWARE REMOVAL Right 06/09/2018   Procedure: Repositioning of Right Lumbar three Right lumbar  four pedicle screw with MAZOR;  Surgeon: Kristeen Miss, MD;  Location: [facility];  Service: Neurosurgery;  Laterality: Right;  . HYSTEROSCOPY W/D&C  07/17/2010   with exc. endometrial polyps  . KNEE ARTHROSCOPY Right 03/07/2003; 01/14/2005; 08/31/2006  . KNEE ARTHROSCOPY Left 03/21/2003; 11/26/2004  . LUMBAR PERCUTANEOUS PEDICLE SCREW 3 LEVEL N/A 02/17/2018   Procedure: LUMBAR PERCUTANEOUS PEDICLE SCREW PLACEMENT LUMBAR THREE-SACRAL ONE;  Surgeon: Kristeen Miss, MD;  Location: [facility];  Service: Neurosurgery;  Laterality: N/A;  . SHOULDER ARTHROSCOPY Right   . TOE SURGERY Left 01/10/2003   claw toe  correction 2nd, 3rd, 4th toes  . TOTAL HIP ARTHROPLASTY Right 04/21/2016  . TOTAL HIP ARTHROPLASTY Right 04/21/2016   Procedure: TOTAL HIP ARTHROPLASTY ANTERIOR APPROACH;  Surgeon: Ninetta Lights, MD;  Location: [facility];  Service: Orthopedics;  Laterality: Right;  . TOTAL KNEE ARTHROPLASTY Left 01/21/2010  . TOTAL KNEE ARTHROPLASTY Right 08/26/2008    There were no vitals filed for this visit.  Subjective Assessment - 07/14/18 1152    Subjective  Reports she is performing her HEP stretches at home but continues to experience tightness especially in the upper trap area.     Pertinent History  Past Surgical Hx: B TKA (2009, 2011) and R THA 2017. PMH includes; OA, Morbid Obesity, Depression, HTN, Lumbar Spondylosis, Polycystic Ovarian Syndrome, Pneumonia, and PONV    Limitations  Standing;Walking;Lifting;Sitting    How long can you sit comfortably?  Sitting is uncomfortable. No amount of time specified.     How long can you stand comfortably?  about 5-10 mins    How long can you walk comfortably?  Short period of time with no time period specified    Patient Stated Goals  Reports goal of being more mobile with increased flexibility. Wants to "atleast put lotion on her feet."    Currently in Pain?  No/denies         [facility] PT Assessment - 07/14/18 1159      6 Minute Walk- Baseline   6 Minute Walk- Baseline  yes    BP (mmHg)  138/82    HR (bpm)  84    02 Sat (%RA)  98 %    Modified Borg Scale for Dyspnea  0- Nothing at all    Perceived Rate of Exertion (Borg)  6-      6 Minute walk- Post Test   6 Minute Walk Post Test  yes    BP (mmHg)  (!) 144/94    HR (bpm)  82    02 Sat (%RA)  99 %    Modified Borg Scale for Dyspnea  1- Very mild shortness of breath    Perceived Rate of Exertion (Borg)  10-      6 minute walk test results    Aerobic Endurance Distance Walked  675    Endurance additional comments  Pt performed 6 min walk test with rollator                    [facility]  Adult PT Treatment/Exercise - 07/14/18 1305      Transfers   Transfers  Sit to Stand;Stand to [employer] Transfers    Sit to Stand  5: Supervision    Stand to Sit  5: Supervision    Stand Pivot Transfers  5: Supervision      Ambulation/Gait   Ambulation/Gait  Yes    Ambulation/Gait Assistance  5: Supervision    Ambulation/Gait Assistance Details  Pt performs outdoor ambulation trial with rollator navigating paved  surfaces, ramps, and curbs. Pt performs initial trial with rollator and without bioness upper cuff. Pt demonstrates quick onset of LE fatigue with reduction in step length with increased distance. Pt then performs ambulation trial with bioness upper cuff providing quad stimulation during gait cycle while using rollator. Pt demonstrates observable improvement with stride length and cadence.     Ambulation Distance (Feet)  1000 Feet    Assistive device  4-wheeled walker    Gait Pattern  Step-through pattern;Decreased hip/knee flexion - right;Decreased arm swing - right;Decreased arm swing - left;Decreased stride length;Decreased hip/knee flexion - left;Decreased dorsiflexion - right;Decreased dorsiflexion - left;Wide base of support;Decreased trunk rotation;Decreased weight shift to right    Ambulation Surface  Outdoor;Paved;Unlevel    Curb  5: Supervision    Curb Details (indicate cue type and reason)  Therapist provides verbal cues for proper technique to ascend/ descend curbs using rollator safely. Pt performs multiple trials with proper technique indicating good carry over. Pt then attempts to perform ascending with R LE and descending with L LE for increased challenge. Pt demonstrates observable reduction in hip flexion during functional task R LE>L LE      Exercises   Exercises  Knee/Hip      Knee/Hip Exercises: Standing   Other Standing Knee Exercises  Pt performs 1x8 reps of R eccentric quad on 4 inch step with bioness upper cuff in training mode. Pt intermittantly uses //  bars for UE assist 2/2 R LE weakness      Modalities   Modalities  Electrical Stimulation      Electrical Stimulation   Electrical Stimulation Location  R LE quad and tibialis anterior. Only upper cuff providing stimulation to quad.     Electrical Stimulation Action  open and closed chain quad activation during gait cycle. Also providing closed chain quad activation during eccentric quad exercise from 4 inch step     Electrical Stimulation Parameters  see parameters on tablet 2    Electrical Stimulation Goals  Strength;Neuromuscular facilitation             PT Education - 07/14/18 1313    Education Details  Therapist provided education regarding clinical findings from 6 minute walk test and POC moving forward.     Person(s) Educated  Patient    Methods  Explanation    Comprehension  Verbalized understanding       PT Short Term Goals - 07/14/18 1313      PT SHORT TERM GOAL #1   Title  Pt will initiate HEP in order to indicate improved functional mobility and decreased fall risk.    Time  4    Period  Weeks    Status  New      PT SHORT TERM GOAL #2   Title  Pt will improve gait speed to >/= 2.57 ft/sec with LRAD in order to indicate decreased fall risk     Baseline  10/14: 2.27 ft/ sec with no AD    Time  4    Period  Weeks    Status  New      PT SHORT TERM GOAL #3   Title  Pt will perform 5TSS </=17.57 secs with single UE support only in order to indicate decreased fall risk and improved functional strength.      Baseline  10/14: 22.74 from standard height chair with B UE assist     Time  4    Period  Weeks    Status  New  PT SHORT TERM GOAL #4   Title  Pt will improve BERG balance score to >/=34/56 in order to indicate decreased fall risk.     Baseline  10/14: 30/56 indicative of high fall risk potential     Time  4    Period  Weeks    Status  New      PT SHORT TERM GOAL #5   Title  Pt will negotiate up/down 8 steps with bilateral rails with supervision in  order to indicate improved LE strength    Time  4    Period  Weeks    Status  New      PT SHORT TERM GOAL #6   Title  Pt will ambulate 300' on uneven surfaces, ramps, curbs, inclines, demonstrating ability to vary gait speed when cued using LRAD and therapist providing supervision to improve safety with community distances     Time  4    Period  Weeks    Status  New      PT SHORT TERM GOAL #7   Title  Pt will participate in 6 min walk test to assess baseline endurance with functional mobility     Time  4    Period  Weeks    Status  Achieved        PT Long Term Goals - 07/14/18 1314      PT LONG TERM GOAL #1   Title  Pt will be independent with final HEP in order to indicate improved functional mobilty and decreased fall risk.     Time  8    Period  Weeks    Status  New      PT LONG TERM GOAL #2   Title  Pt will improve gait speed to >/=3.17 ft/sec w/ LRAD in order to indicate decreased fall risk and safe community ambulator.     Period  Weeks    Status  New      PT LONG TERM GOAL #3   Title  Pt will perform 5TSS in </=14 secs without UE support in order to indicate decreased fall risk and improved functional strength.     Time  8    Period  Weeks    Status  New      PT LONG TERM GOAL #4   Title  Pt will improve BERG balance score to >/=38/56 in order to indicate decreased fall risk.      Time  8    Period  Weeks    Status  New      PT LONG TERM GOAL #5   Title  Pt will negotiate 8 steps with single HR and LRAD utilizing reciprocal stepping technique at Mod I level to increase safety with community accessibility.     Time  8    Period  Weeks    Status  New      PT LONG TERM GOAL #6   Title  Pt will ambulate 500 feet outdoors with LRAD navigating uneven terrain, curbs, ramps, and demonstrating ability to vary gait speed at Mod I level to improve functional mobility.     Time  8    Period  Weeks    Status  New      PT LONG TERM GOAL #7   Title  Pt will improve 6  min walk test by 150' indicating improvement with functional endurance    10/25: Patient performs 675 with rollator    Time  8  Period  Weeks    Status  Revised            Plan - 07/14/18 1153    Clinical Impression Statement  Today's skilled session focused on assessing functional endurance via 6 min walk test, R LE quad strengthening in closed chain, and gait training with bioness upper cuff providing quad stimulation during gait cycle. Patient performs 675 feet during 6 minute walk test with rollator with mild SOB s/p activity duration. Patient demonstrates significant improvement in gait trial with R bioness upper thigh cuff providing quad stimulation. Pt demonstrates significant improvement with stride length and is able to ambulate with slower fatique onset. Pt demonstrates ability to perform R eccentric quad exercise from 4 inch step with bioness device in training mode. Pt demonstrates observable R LE quad weakness indicated by intermittant reliance on UE's for assistance. Pt will continue to benefit from skilled PT to address strength, balance, and functional mobility deficits.     Rehab Potential  Good    Clinical Impairments Affecting Rehab Potential  Pt is motivated to improve. Strength gains may be limited 2/2 to nerve regeneration process     PT Frequency  2x / week    PT Duration  8 weeks    PT Treatment/Interventions  ADLs/Self Care Home Management;Aquatic Therapy;DME Instruction;Gait training;Stair training;Functional mobility training;Therapeutic activities;Therapeutic exercise;Balance training;Neuromuscular re-education;Patient/family education;Electrical Stimulation;Passive range of motion;Energy conservation    PT Next Visit Plan  strengthening HEP with her NMES unit, balance, gait training on level/ compliant surfaces with and without rollator, stretches for trap tightness/ manual therapy    PT Home Exercise Plan   [id number]     Consulted and Agree with Plan of Care   Patient       Patient will benefit from skilled therapeutic intervention in order to improve the following deficits and impairments:  Abnormal gait, Decreased activity tolerance, Decreased balance, Decreased coordination, Decreased endurance, Decreased mobility, Decreased range of motion, Decreased strength, Impaired perceived functional ability, Impaired flexibility, Impaired sensation, Pain, Decreased knowledge of use of DME, Decreased knowledge of precautions, Obesity  Visit Diagnosis: Unsteadiness on feet  Muscle weakness (generalized)     Problem List Patient Active Problem List   Diagnosis Date Noted  . Lumbosacral radiculopathy at L4 06/09/2018  . Generalized anxiety disorder   . Pain   . Fall   . Labile blood pressure   . Neuropathic pain   . Therapeutic opioid induced constipation   . Urinary retention   . Lumbar radiculopathy 02/21/2018  . PCOS (polycystic ovarian syndrome)   . Morbid obesity ([place])   . Depression   . Generalized OA   . Postoperative pain   . Acute blood loss anemia   . Status post surgery 02/17/2018  . Lumbar radiculopathy, chronic 02/17/2018  . Primary localized osteoarthritis of right hip 04/21/2016  . Sinusitis 06/25/2015  . Cough 06/25/2015  . Essential hypertension 04/03/2015  . Dehydration 04/03/2015  . CAP (community acquired pneumonia) 03/31/2015  . Right lower lobe pneumonia ([place]) 03/31/2015  . Fever 03/31/2015  . Pregnancy 04/29/2014  . Miscarriage   . Incomplete miscarriage 04/02/2011    Class: Acute  . 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 [place], [place], [address] Phone: [phone]   Fax:  [phone]  Name: ROSALINA DINGWALL MRN: [id number] Date of Birth: 11/06/1975

## 2018-07-17 ENCOUNTER — Ambulatory Visit
Admission: RE | Admit: 2018-07-17 | Discharge: 2018-07-17 | Disposition: A | Discharge status: Home / Self Care | Payer: BLUE CROSS/BLUE SHIELD | Source: Ambulatory Visit | Attending: Obstetrics and Gynecology | Admitting: Obstetrics and Gynecology

## 2018-07-17 DIAGNOSIS — Z1231 Encounter for screening mammogram for malignant neoplasm of breast: Secondary | ICD-10-CM

## 2018-07-19 ENCOUNTER — Encounter: Payer: Self-pay | Admitting: Physical Therapy

## 2018-07-19 ENCOUNTER — Encounter
Payer: BLUE CROSS/BLUE SHIELD | Attending: Physical Medicine & Rehabilitation | Admitting: Physical Medicine & Rehabilitation

## 2018-07-19 ENCOUNTER — Other Ambulatory Visit: Payer: Self-pay

## 2018-07-19 ENCOUNTER — Ambulatory Visit: Payer: BLUE CROSS/BLUE SHIELD | Admitting: Physical Therapy

## 2018-07-19 ENCOUNTER — Encounter: Payer: Self-pay | Admitting: Physical Medicine & Rehabilitation

## 2018-07-19 VITALS — BP 137/83 | HR 83 | Ht 71.5 in | Wt 277.2 lb

## 2018-07-19 DIAGNOSIS — G8918 Other acute postprocedural pain: Secondary | ICD-10-CM

## 2018-07-19 DIAGNOSIS — Z96653 Presence of artificial knee joint, bilateral: Secondary | ICD-10-CM | POA: Diagnosis not present

## 2018-07-19 DIAGNOSIS — M5416 Radiculopathy, lumbar region: Secondary | ICD-10-CM | POA: Diagnosis not present

## 2018-07-19 DIAGNOSIS — Z6834 Body mass index (BMI) 34.0-34.9, adult: Secondary | ICD-10-CM | POA: Insufficient documentation

## 2018-07-19 DIAGNOSIS — M6281 Muscle weakness (generalized): Secondary | ICD-10-CM

## 2018-07-19 DIAGNOSIS — M4326 Fusion of spine, lumbar region: Secondary | ICD-10-CM | POA: Insufficient documentation

## 2018-07-19 DIAGNOSIS — Z96641 Presence of right artificial hip joint: Secondary | ICD-10-CM | POA: Diagnosis not present

## 2018-07-19 DIAGNOSIS — F329 Major depressive disorder, single episode, unspecified: Secondary | ICD-10-CM | POA: Diagnosis not present

## 2018-07-19 DIAGNOSIS — R2681 Unsteadiness on feet: Secondary | ICD-10-CM

## 2018-07-19 DIAGNOSIS — E282 Polycystic ovarian syndrome: Secondary | ICD-10-CM | POA: Insufficient documentation

## 2018-07-19 DIAGNOSIS — R269 Unspecified abnormalities of gait and mobility: Secondary | ICD-10-CM | POA: Insufficient documentation

## 2018-07-19 DIAGNOSIS — M792 Neuralgia and neuritis, unspecified: Secondary | ICD-10-CM

## 2018-07-19 DIAGNOSIS — I10 Essential (primary) hypertension: Secondary | ICD-10-CM | POA: Insufficient documentation

## 2018-07-19 DIAGNOSIS — M4726 Other spondylosis with radiculopathy, lumbar region: Secondary | ICD-10-CM | POA: Insufficient documentation

## 2018-07-19 DIAGNOSIS — F411 Generalized anxiety disorder: Secondary | ICD-10-CM

## 2018-07-19 MED ORDER — LIDOCAINE 5 % EX PTCH: 1.0000 | MEDICATED_PATCH | Freq: Every day | CUTANEOUS | 1 refills | Status: DC | PRN

## 2018-07-19 MED ORDER — BACLOFEN 10 MG PO TABS: 30.0000 mg | ORAL_TABLET | Freq: Three times a day (TID) | ORAL | 1 refills | Status: DC

## 2018-07-19 MED ORDER — PREGABALIN 75 MG PO CAPS: 75.0000 mg | ORAL_CAPSULE | Freq: Three times a day (TID) | ORAL | 1 refills | Status: DC

## 2018-07-19 NOTE — Patient Instructions (Signed)

## 2018-07-19 NOTE — Progress Notes (Signed)
Subjective:    Patient ID: Ashley [name], female    DOB: [birth year], 42 y.o.   MRN: [id number]  HPI 42 year old right-handed female with history of hypertension, morbid obesity, bilateral TKA and chronic radicular back pain presents for follow up for  lumbar spondylosis with radiculopathy s/p decompression L3-4 and L4-5 with anterior exposure of L5-S1 as well as percutaneous fixation L3 to the sacrum 02/17/2018 and resulting right L3 radiculopathy s/p hardware replacement and revision.    Last clinic visit 05/19/18.  Since that time, pt had loosening of hardware and underwent surgical replacement. She states she is doing slightly better since that point.  She started outpatient therapies last week. She continues to have neuropathic pain.  She has benefit with TENS. She states she has increased 20 pounds in the last few months and picked up her diet suppressant medication yesterday. Denies falls.     Pain Inventory Average Pain 7 Pain Right Now 5 My pain is constant, stabbing and aching  In the last 24 hours, has pain interfered with the following? General activity 6 Relation with others 7 Enjoyment of life 6 What TIME of day is your pain at its worst? night Sleep (in general) Fair  Pain is worse with: walking, bending, sitting, standing and some activites Pain improves with: heat/ice and medication Relief from Meds: 7  Mobility use a walker ability to climb steps?  no do you drive?  no  Function disabled: date disabled .  Neuro/Psych trouble walking depression  Prior Studies Any changes since last visit?  no  Physicians involved in your care Any changes since last visit?  no   Family History  Problem Relation Age of Onset  . Cancer Maternal Grandmother        UTERINE  . Cancer Paternal Grandmother        breast  . Breast cancer Paternal Grandmother   . Diabetes Father   . Cancer Father        COLON  . Heart disease Father   . Kidney disease Father    dialysis  . Diabetes Sister   . Breast cancer Cousin    Social History   Socioeconomic History  . Marital status: Legally Separated    Spouse name: Not on file  . Number of children: Not on file  . Years of education: Not on file  . Highest education level: Not on file  Occupational History  . Not on file  Social Needs  . Financial resource strain: Not on file  . Food insecurity:    Worry: Not on file    Inability: Not on file  . Transportation needs:    Medical: Not on file    Non-medical: Not on file  Tobacco Use  . Smoking status: Never Smoker  . Smokeless tobacco: Never Used  Substance and Sexual Activity  . Alcohol use: No    Alcohol/week: 0.0 standard drinks  . Drug use: No  . Sexual activity: Not on file  Lifestyle  . Physical activity:    Days per week: Not on file    Minutes per session: Not on file  . Stress: Not on file  Relationships  . Social connections:    Talks on phone: Not on file    Gets together: Not on file    Attends religious service: Not on file    Active member of club or organization: Not on file    Attends meetings of clubs or organizations: Not on file  Relationship status: Not on file  Other Topics Concern  . Not on file  Social History Narrative  . Not on file   Past Surgical History:  Procedure Laterality Date  . ABDOMINAL EXPOSURE N/A 02/17/2018   Procedure: ABDOMINAL EXPOSURE;  Surgeon: Rosetta Posner, MD;  Location: [facility] OR;  Service: Vascular;  Laterality: N/A;  . ANKLE SURGERY     x 2  . ANTERIOR LAT LUMBAR FUSION N/A 02/17/2018   Procedure: Lumbar three-four Lumbar four-five  Anterolateral lumbar interbody fusion;  Surgeon: Kristeen Miss, MD;  Location: [place];  Service: Neurosurgery;  Laterality: N/A;  . ANTERIOR LUMBAR FUSION N/A 02/17/2018   Procedure: Lumbar five-Sacral one Anterior lumbar interbody fusion;  Surgeon: Kristeen Miss, MD;  Location: [place];  Service: Neurosurgery;  Laterality: N/A;  . APPLICATION OF ROBOTIC  ASSISTANCE FOR SPINAL PROCEDURE N/A 02/17/2018   Procedure: APPLICATION OF ROBOTIC ASSISTANCE FOR SPINAL PROCEDURE;  Surgeon: Kristeen Miss, MD;  Location: [place];  Service: Neurosurgery;  Laterality: N/A;  . APPLICATION OF ROBOTIC ASSISTANCE FOR SPINAL PROCEDURE N/A 06/09/2018   Procedure: APPLICATION OF ROBOTIC ASSISTANCE FOR SPINAL PROCEDURE;  Surgeon: Kristeen Miss, MD;  Location: [place];  Service: Neurosurgery;  Laterality: N/A;  . BACK SURGERY     2017  discectomy  . CARPAL TUNNEL RELEASE Left 07/04/2014   Procedure: LEFT CARPAL TUNNEL RELEASE;  Surgeon: Ninetta Lights, MD;  Location: [place];  Service: Orthopedics;  Laterality: Left;  . CARPAL TUNNEL RELEASE Right 08/08/2014   Procedure: RIGHT CARPAL TUNNEL RELEASE;  Surgeon: Ninetta Lights, MD;  Location: [place];  Service: Orthopedics;  Laterality: Right;  . DILATION AND CURETTAGE OF UTERUS    . DILATION AND EVACUATION  04/02/2011   Procedure: DILATATION AND EVACUATION (D&E);  Surgeon: Luz Lex, MD;  Location: [facility];  Service: Gynecology;  Laterality: N/A;  dvt left mid thigh  . DORSAL COMPARTMENT RELEASE Left 07/04/2014   Procedure: LEFT DEQUERVAINS;  Surgeon: Ninetta Lights, MD;  Location: [place];  Service: Orthopedics;  Laterality: Left;  . ENDOSCOPIC PLANTAR FASCIOTOMY Left 02/01/2002  . HARDWARE REMOVAL Right 06/09/2018   Procedure: Repositioning of Right Lumbar three Right lumbar  four pedicle screw with MAZOR;  Surgeon: Kristeen Miss, MD;  Location: [place];  Service: Neurosurgery;  Laterality: Right;  . HYSTEROSCOPY W/D&C  07/17/2010   with exc. endometrial polyps  . KNEE ARTHROSCOPY Right 03/07/2003; 01/14/2005; 08/31/2006  . KNEE ARTHROSCOPY Left 03/21/2003; 11/26/2004  . LUMBAR PERCUTANEOUS PEDICLE SCREW 3 LEVEL N/A 02/17/2018   Procedure: LUMBAR PERCUTANEOUS PEDICLE SCREW PLACEMENT LUMBAR THREE-SACRAL ONE;  Surgeon: Kristeen Miss, MD;  Location: [place];  Service: Neurosurgery;   Laterality: N/A;  . SHOULDER ARTHROSCOPY Right   . TOE SURGERY Left 01/10/2003   claw toe correction 2nd, 3rd, 4th toes  . TOTAL HIP ARTHROPLASTY Right 04/21/2016  . TOTAL HIP ARTHROPLASTY Right 04/21/2016   Procedure: TOTAL HIP ARTHROPLASTY ANTERIOR APPROACH;  Surgeon: Ninetta Lights, MD;  Location: [place];  Service: Orthopedics;  Laterality: Right;  . TOTAL KNEE ARTHROPLASTY Left 01/21/2010  . TOTAL KNEE ARTHROPLASTY Right 08/26/2008   Past Medical History:  Diagnosis Date  . Arthritis    knees, back  . 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 Complication of anesthesia   . Depression   . History of pneumonia   . Hypertension   . Lumbar spondylosis   . PCOS (polycystic ovarian syndrome)   . Pneumonia  2016  . PONV (postoperative nausea and vomiting)    BP 137/83   Pulse 83   Ht 5' 11.5" (1.816 m)   Wt 277 lb 3.2 oz (125.7 kg)   SpO2 98%   BMI 38.12 kg/m   Opioid Risk Score:   Fall Risk Score:  `1  Depression screen PHQ 2/9  Depression screen [facility] 2/9 07/19/2018 07/13/2016 11/30/2015 08/17/2015 06/25/2015 06/01/2015 04/10/2015  Decreased Interest 1 2 0 0 0 0 0  Down, Depressed, Hopeless 1 3 0 0 0 0 0  PHQ - 2 Score 2 5 0 0 0 0 0     Review of Systems  Constitutional: Positive for unexpected weight change.  HENT: Negative.   Eyes: Negative.   Respiratory: Negative.   Cardiovascular: Negative.   Gastrointestinal: Negative.   Endocrine: Negative.   Genitourinary: Negative.   Musculoskeletal: Positive for arthralgias, back pain, gait problem, joint swelling and myalgias.  Skin: Negative.   Allergic/Immunologic: Negative.   Neurological: Negative for numbness.  Hematological: Negative.   Psychiatric/Behavioral: Negative.   All other systems reviewed and are negative.     Objective:   Physical Exam Constitutional: No distress . Obese HENT: Normocephalic, atraumatic Eyes: EOMI, No discharge Cardiovascular: RRR. No JVD    Respiratory: CTA bilaterally.  Normal effort    GI: BS +, non-distended  Musc: no edema, no tenderness Neurological: She is alert.  LLE: 5/5 proximal to distal RLE: HF 2+/5, KE 4/5, ADF 5/5 (some pain inhibition) Sensation diminished to light touch right lateral thigh.  Skin: warm and dry. Psychiatric: Flat     Assessment & Plan:  42 year old right-handed female with history of hypertension, morbid obesity, bilateral TKA and chronic radicular back pain presents for follow up for  lumbar spondylosis with radiculopathy s/p decompression L3-4 and L4-5 with anterior exposure of L5-S1 as well as percutaneous fixation L3 to the sacrum 02/17/2018 and resulting right L3 radiculopathy s/p hardware replacement and revision.    1. Decreased functional mobility secondary to lumbar spondylosis with radiculopathy.S/P decompression L3-4 and L4-5 with anterior exposure of L5-S1 as well as percutaneous fixation L3 to the sacrum 02/17/2018 s/p revision and hardware replacement   Cont therapies  Cont follow up with Neurosurg  2. Pain Management:   Unable to tolerate Gabapentin at high doses  Cont Lyrica 75 TID.  Cont Baclofen 30 mg TID  Cont Lidoderm patch  Con TENS IT  Cont heating pad for limited time (only using occasionally)  Cont follow up Psychology - [facility]  Was not interested in NCS/EMG  3. Mood  Cont follow up with Psychiatry  4. Morbid obesity.   States she knows what she needs to do and has lost weight in the past  She is going to start appetite suppressant, picked up prescription yesterday  5. Gait abnormality  No assistive device at present  Cont therapies

## 2018-07-20 NOTE — Therapy (Signed)
[facility] [address] [place], [place], [address] Phone: [phone]   Fax:  [phone]  Physical Therapy Treatment  Patient Details  Name: Ashley [name] MRN: [id number] Date of Birth: 1976-03-05 Referring Provider (PT): Dr. Kristeen Miss    Encounter Date: 07/19/2018     07/19/18 1155  PT Visits / Re-Eval  Visit Number 4  Number of Visits 17  Date for PT Re-Evaluation 09/01/18  Authorization  Authorization Type [id number] 22 visits remaining  Authorization - Visit Number 4  Authorization - Number of Visits 22  PT Time Calculation  PT Start Time 1150  PT Stop Time 1231  PT Time Calculation (min) 41 min  PT - End of Session  Equipment Utilized During Treatment Other (comment);Gait belt (bioness)  Activity Tolerance Patient tolerated treatment well;No increased pain;Patient limited by fatigue  Behavior During Therapy [facility] for tasks assessed/performed    Past Medical History:  Diagnosis Date  . Arthritis    knees, back  . 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 Complication of anesthesia   . Depression   . History of pneumonia   . Hypertension   . Lumbar spondylosis   . PCOS (polycystic ovarian syndrome)   . Pneumonia    2016  . PONV (postoperative nausea and vomiting)     Past Surgical History:  Procedure Laterality Date  . ABDOMINAL EXPOSURE N/A 02/17/2018   Procedure: ABDOMINAL EXPOSURE;  Surgeon: Rosetta Posner, MD;  Location: [facility] OR;  Service: Vascular;  Laterality: N/A;  . ANKLE SURGERY     x 2  . ANTERIOR LAT LUMBAR FUSION N/A 02/17/2018   Procedure: Lumbar three-four Lumbar four-five  Anterolateral lumbar interbody fusion;  Surgeon: Kristeen Miss, MD;  Location: [place];  Service: Neurosurgery;  Laterality: N/A;  . ANTERIOR LUMBAR FUSION N/A 02/17/2018   Procedure: Lumbar five-Sacral one Anterior lumbar interbody fusion;  Surgeon: Kristeen Miss, MD;  Location: [place];  Service:  Neurosurgery;  Laterality: N/A;  . APPLICATION OF ROBOTIC ASSISTANCE FOR SPINAL PROCEDURE N/A 02/17/2018   Procedure: APPLICATION OF ROBOTIC ASSISTANCE FOR SPINAL PROCEDURE;  Surgeon: Kristeen Miss, MD;  Location: [place];  Service: Neurosurgery;  Laterality: N/A;  . APPLICATION OF ROBOTIC ASSISTANCE FOR SPINAL PROCEDURE N/A 06/09/2018   Procedure: APPLICATION OF ROBOTIC ASSISTANCE FOR SPINAL PROCEDURE;  Surgeon: Kristeen Miss, MD;  Location: [place];  Service: Neurosurgery;  Laterality: N/A;  . BACK SURGERY     2017  discectomy  . CARPAL TUNNEL RELEASE Left 07/04/2014   Procedure: LEFT CARPAL TUNNEL RELEASE;  Surgeon: Ninetta Lights, MD;  Location: [place];  Service: Orthopedics;  Laterality: Left;  . CARPAL TUNNEL RELEASE Right 08/08/2014   Procedure: RIGHT CARPAL TUNNEL RELEASE;  Surgeon: Ninetta Lights, MD;  Location: [place];  Service: Orthopedics;  Laterality: Right;  . DILATION AND CURETTAGE OF UTERUS    . DILATION AND EVACUATION  04/02/2011   Procedure: DILATATION AND EVACUATION (D&E);  Surgeon: Luz Lex, MD;  Location: [facility];  Service: Gynecology;  Laterality: N/A;  dvt left mid thigh  . DORSAL COMPARTMENT RELEASE Left 07/04/2014   Procedure: LEFT DEQUERVAINS;  Surgeon: Ninetta Lights, MD;  Location: [place];  Service: Orthopedics;  Laterality: Left;  . ENDOSCOPIC PLANTAR FASCIOTOMY Left 02/01/2002  . HARDWARE REMOVAL Right 06/09/2018   Procedure: Repositioning of Right Lumbar three Right lumbar  four pedicle screw with MAZOR;  Surgeon: Kristeen Miss, MD;  Location:  [place] [facility];  Service: Neurosurgery;  Laterality: Right;  . HYSTEROSCOPY W/D&C  07/17/2010   with exc. endometrial polyps  . KNEE ARTHROSCOPY Right 03/07/2003; 01/14/2005; 08/31/2006  . KNEE ARTHROSCOPY Left 03/21/2003; 11/26/2004  . LUMBAR PERCUTANEOUS PEDICLE SCREW 3 LEVEL N/A 02/17/2018   Procedure: LUMBAR PERCUTANEOUS PEDICLE SCREW PLACEMENT LUMBAR THREE-SACRAL ONE;  Surgeon:  Kristeen Miss, MD;  Location: [place];  Service: Neurosurgery;  Laterality: N/A;  . SHOULDER ARTHROSCOPY Right   . TOE SURGERY Left 01/10/2003   claw toe correction 2nd, 3rd, 4th toes  . TOTAL HIP ARTHROPLASTY Right 04/21/2016  . TOTAL HIP ARTHROPLASTY Right 04/21/2016   Procedure: TOTAL HIP ARTHROPLASTY ANTERIOR APPROACH;  Surgeon: Ninetta Lights, MD;  Location: [place];  Service: Orthopedics;  Laterality: Right;  . TOTAL KNEE ARTHROPLASTY Left 01/21/2010  . TOTAL KNEE ARTHROPLASTY Right 08/26/2008    There were no vitals filed for this visit.      07/19/18 1153  Symptoms/Limitations  Subjective No new complaints. Stretching every day at home. Continues to report increased tightness of shoulder/neck areas.  Pertinent History Past Surgical Hx: B TKA (2009, 2011) and R THA 2017. PMH includes; OA, Morbid Obesity, Depression, HTN, Lumbar Spondylosis, Polycystic Ovarian Syndrome, Pneumonia, and PONV  Limitations Standing;Walking;Lifting;Sitting  How long can you sit comfortably? Sitting is uncomfortable. No amount of time specified.   How long can you stand comfortably? about 5-10 mins  How long can you walk comfortably? Short period of time with no time period specified  Patient Stated Goals Reports goal of being more mobile with increased flexibility. Wants to "atleast put lotion on her feet."  Pain Assessment  Currently in Pain? Yes  Pain Location Generalized  Pain Orientation Right;Mid;Lower  Pain Descriptors / Indicators Aching;Burning;Shooting  Pain Type Neuropathic pain;Surgical pain;Chronic pain  Pain Radiating Towards down into her right hip/thigh, spasms into the buttocks, thigh, calf  Pain Onset More than a month ago  Pain Frequency Constant  Aggravating Factors  activity  Pain Relieving Factors heating pad        07/19/18 1157  Transfers  Transfers Sit to Stand;Stand to Sit;Stand Pivot Transfers  Sit to Stand 6: Modified independent (Device/Increase time)  Stand to Sit 6:  Modified independent (Device/Increase time)  Ambulation/Gait  Ambulation/Gait Yes  Ambulation/Gait Assistance 5: Supervision  Ambulation/Gait Assistance Details concurrent with Bioness to right LE with only thigh cuff active. cues to relax upper trunk and for arm swing. Increased quad activation noted with use of Bioness.                              Ambulation Distance (Feet) 230 Feet (x1, plus around gym)  Assistive device Rollator  Gait Pattern Step-through pattern;Decreased hip/knee flexion - right;Decreased arm swing - right;Decreased arm swing - left;Decreased stride length;Decreased hip/knee flexion - left;Decreased dorsiflexion - right;Decreased dorsiflexion - left;Wide base of support;Decreased trunk rotation;Decreased weight shift to right  Ambulation Surface Unlevel;Indoor  Neuro Re-ed   Neuro Re-ed Details  concurrent with Bioness to right thigh for strengthening: wall squats during on time of 6 sec's rest for 10 sec's with single UE support, min guard assist; at bottom of steps- fwd step up/down with right LE during on time of 8 sec's, rest for 12 sec's x 10 reps. min guard assist with single UE support, cues on ex form/technique.  Programme researcher, broadcasting/film/video Location R LE quad and tibialis anterior. Only upper cuff providing stimulation to quad.   Electrical Stimulation Action for increased quad activation with gait and exercises   Electrical Stimulation Parameters refer to tablet 2 for adjusted parameters  Electrical Stimulation Goals Strength;Neuromuscular facilitation          PT Short Term Goals - 07/14/18 1313      PT SHORT TERM GOAL #1   Title  Pt will initiate HEP in order to indicate improved functional mobility and decreased fall risk.    Time  4    Period  Weeks    Status  New      PT SHORT TERM GOAL #2   Title  Pt will improve gait speed to >/= 2.57 ft/sec with LRAD in order to indicate decreased fall  risk     Baseline  10/14: 2.27 ft/ sec with no AD    Time  4    Period  Weeks    Status  New      PT SHORT TERM GOAL #3   Title  Pt will perform 5TSS </=17.57 secs with single UE support only in order to indicate decreased fall risk and improved functional strength.      Baseline  10/14: 22.74 from standard height chair with B UE assist     Time  4    Period  Weeks    Status  New      PT SHORT TERM GOAL #4   Title  Pt will improve BERG balance score to >/=34/56 in order to indicate decreased fall risk.     Baseline  10/14: 30/56 indicative of high fall risk potential     Time  4    Period  Weeks    Status  New      PT SHORT TERM GOAL #5   Title  Pt will negotiate up/down 8 steps with bilateral rails with supervision in order to indicate improved LE strength    Time  4    Period  Weeks    Status  New      PT SHORT TERM GOAL #6   Title  Pt will ambulate 300' on uneven surfaces, ramps, curbs, inclines, demonstrating ability to vary gait speed when cued using LRAD and therapist providing supervision to improve safety with community distances     Time  4    Period  Weeks    Status  New      PT SHORT TERM GOAL #7   Title  Pt will participate in 6 min walk test to assess baseline endurance with functional mobility     Time  4    Period  Weeks    Status  Achieved        PT Long Term Goals - 07/14/18 1314      PT LONG TERM GOAL #1   Title  Pt will be independent with final HEP in order to indicate improved functional mobilty and decreased fall risk.     Time  8    Period  Weeks    Status  New      PT LONG TERM GOAL #2   Title  Pt will improve gait speed to >/=3.17 ft/sec w/ LRAD in order to indicate decreased fall risk and safe community ambulator.     Period  Weeks    Status  New      PT LONG TERM GOAL #3   Title  Pt  will perform 5TSS in </=14 secs without UE support in order to indicate decreased fall risk and improved functional strength.     Time  8    Period   Weeks    Status  New      PT LONG TERM GOAL #4   Title  Pt will improve BERG balance score to >/=38/56 in order to indicate decreased fall risk.      Time  8    Period  Weeks    Status  New      PT LONG TERM GOAL #5   Title  Pt will negotiate 8 steps with single HR and LRAD utilizing reciprocal stepping technique at Mod I level to increase safety with community accessibility.     Time  8    Period  Weeks    Status  New      PT LONG TERM GOAL #6   Title  Pt will ambulate 500 feet outdoors with LRAD navigating uneven terrain, curbs, ramps, and demonstrating ability to vary gait speed at Mod I level to improve functional mobility.     Time  8    Period  Weeks    Status  New      PT LONG TERM GOAL #7   Title  Pt will improve 6 min walk test by 150' indicating improvement with functional endurance    10/25: Patient performs 675 with rollator    Time  8    Period  Weeks    Status  Revised          07/19/18 1155  Plan  Clinical Impression Statement Today's skilled session continued to focus on use of Bioness to right quads for increased muscle activation/strengthening with gait/exercises. No issues reported after session. One of pt's primary PT's was observing with session for additional training on use of Bioness. Dry needling was discussed due to pt's continued reports of tightness. Will need to submit to MD certification to include Dry Needling in order to perform this modality (primary PT to do this). The pt was provided with information sheet on dry needling this session.            Pt will benefit from skilled therapeutic intervention in order to improve on the following deficits Abnormal gait;Decreased activity tolerance;Decreased balance;Decreased coordination;Decreased endurance;Decreased mobility;Decreased range of motion;Decreased strength;Impaired perceived functional ability;Impaired flexibility;Impaired sensation;Pain;Decreased knowledge of use of DME;Decreased knowledge of  precautions;Obesity  Rehab Potential Good  Clinical Impairments Affecting Rehab Potential Pt is motivated to improve. Strength gains may be limited 2/2 to nerve regeneration process   PT Frequency 2x / week  PT Duration 8 weeks  PT Treatment/Interventions ADLs/Self Care Home Management;Aquatic Therapy;DME Instruction;Gait training;Stair training;Functional mobility training;Therapeutic activities;Therapeutic exercise;Balance training;Neuromuscular re-education;Patient/family education;Electrical Stimulation;Passive range of motion;Energy conservation  PT Next Visit Plan PT to submit recert to include dry needling, ? perform this at pt's next session: continue with use of Bioness to right quad with gait/excercises/NMR  PT Home Exercise Plan  [id number]   Consulted and Agree with Plan of Care Patient         Patient will benefit from skilled therapeutic intervention in order to improve the following deficits and impairments:  Abnormal gait, Decreased activity tolerance, Decreased balance, Decreased coordination, Decreased endurance, Decreased mobility, Decreased range of motion, Decreased strength, Impaired perceived functional ability, Impaired flexibility, Impaired sensation, Pain, Decreased knowledge of use of DME, Decreased knowledge of precautions, Obesity  Visit Diagnosis: Unsteadiness on feet  Muscle weakness (generalized)  Problem List Patient Active Problem List   Diagnosis Date Noted  . Lumbosacral radiculopathy at L4 06/09/2018  . Generalized anxiety disorder   . Pain   . Fall   . Labile blood pressure   . Neuropathic pain   . Therapeutic opioid induced constipation   . Urinary retention   . Lumbar radiculopathy 02/21/2018  . PCOS (polycystic ovarian syndrome)   . Morbid obesity ([place])   . Depression   . Generalized OA   . Postoperative pain   . Acute blood loss anemia   . Status post surgery 02/17/2018  . Lumbar radiculopathy, chronic 02/17/2018  . Primary  localized osteoarthritis of right hip 04/21/2016  . Sinusitis 06/25/2015  . Cough 06/25/2015  . Essential hypertension 04/03/2015  . Dehydration 04/03/2015  . CAP (community acquired pneumonia) 03/31/2015  . Right lower lobe pneumonia ([place]) 03/31/2015  . Fever 03/31/2015  . Pregnancy 04/29/2014  . Miscarriage   . Incomplete miscarriage 04/02/2011    Class: Acute  . Deep vein thrombosis (DVT) ([facility]) 04/02/2011    Class: Chronic   Willow Ora, PTA, [facility] [address] [place], [place] [address] [phone] 07/20/18, 9:21 PM   Name: JENNIFERANN STUCKERT MRN: [id number] Date of Birth: 03/23/1976

## 2018-07-21 ENCOUNTER — Encounter: Payer: Self-pay | Admitting: Rehabilitation

## 2018-07-21 ENCOUNTER — Ambulatory Visit: Payer: BLUE CROSS/BLUE SHIELD | Attending: Neurological Surgery | Admitting: Rehabilitation

## 2018-07-21 ENCOUNTER — Telehealth: Payer: Self-pay | Admitting: Rehabilitation

## 2018-07-21 DIAGNOSIS — M545 Low back pain: Secondary | ICD-10-CM | POA: Diagnosis present

## 2018-07-21 DIAGNOSIS — G8929 Other chronic pain: Secondary | ICD-10-CM | POA: Diagnosis present

## 2018-07-21 DIAGNOSIS — M6281 Muscle weakness (generalized): Secondary | ICD-10-CM | POA: Insufficient documentation

## 2018-07-21 DIAGNOSIS — R2681 Unsteadiness on feet: Secondary | ICD-10-CM | POA: Insufficient documentation

## 2018-07-21 DIAGNOSIS — R52 Pain, unspecified: Secondary | ICD-10-CM | POA: Insufficient documentation

## 2018-07-21 NOTE — Addendum Note (Signed)
Addended by: Lanney Gins on: 07/21/2018 07:31 AM   Modules accepted: Orders

## 2018-07-21 NOTE — Telephone Encounter (Signed)
Dr. Posey Pronto,   I am seeing [name] at [facility] for PT.  I have been trying to get an order for a rollator walker, however Dr. Ellene Route has been out of his office.  She is switching insurances soon and wants to try and file for the rollator under current insurance.  Since you just followed up with her on Wednesday, would you mind writing an order in [place] work que for rollator walker and I can give to her today?  Thanks,  Cameron Sprang, PT, MPT [facility] [address] [place], [place], [address] Phone: [phone]   Fax:  [phone] 07/21/18, 9:07 AM

## 2018-07-21 NOTE — Therapy (Signed)
[facility] [address] [place], [place], [address] Phone: [phone]   Fax:  [phone]  Physical Therapy Treatment  Patient Details  Name: ELONA YINGER MRN: [id number] Date of Birth: 1976/04/29 Referring Provider (PT): Dr. Kristeen Miss    Encounter Date: 07/21/2018  PT End of Session - 07/21/18 1044    Visit Number  5    Number of Visits  17    Date for PT Re-Evaluation  09/01/18    Authorization Type  [id number] 22 visits remaining    Authorization - Visit Number  5    Authorization - Number of Visits  22    PT Start Time  0930    PT Stop Time  1015    PT Time Calculation (min)  45 min    Equipment Utilized During Treatment  Other (comment);Gait belt   bioness   Activity Tolerance  Patient tolerated treatment well;Patient limited by fatigue    Behavior During Therapy  [facility] for tasks assessed/performed       Past Medical History:  Diagnosis Date  . Arthritis    knees, back  . 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 Complication of anesthesia   . Depression   . History of pneumonia   . Hypertension   . Lumbar spondylosis   . PCOS (polycystic ovarian syndrome)   . Pneumonia    2016  . PONV (postoperative nausea and vomiting)     Past Surgical History:  Procedure Laterality Date  . ABDOMINAL EXPOSURE N/A 02/17/2018   Procedure: ABDOMINAL EXPOSURE;  Surgeon: Rosetta Posner, MD;  Location: [facility];  Service: Vascular;  Laterality: N/A;  . ANKLE SURGERY     x 2  . ANTERIOR LAT LUMBAR FUSION N/A 02/17/2018   Procedure: Lumbar three-four Lumbar four-five  Anterolateral lumbar interbody fusion;  Surgeon: Kristeen Miss, MD;  Location: [place];  Service: Neurosurgery;  Laterality: N/A;  . ANTERIOR LUMBAR FUSION N/A 02/17/2018   Procedure: Lumbar five-Sacral one Anterior lumbar interbody fusion;  Surgeon: Kristeen Miss, MD;  Location: [place];  Service: Neurosurgery;  Laterality: N/A;  . APPLICATION OF  ROBOTIC ASSISTANCE FOR SPINAL PROCEDURE N/A 02/17/2018   Procedure: APPLICATION OF ROBOTIC ASSISTANCE FOR SPINAL PROCEDURE;  Surgeon: Kristeen Miss, MD;  Location: [place];  Service: Neurosurgery;  Laterality: N/A;  . APPLICATION OF ROBOTIC ASSISTANCE FOR SPINAL PROCEDURE N/A 06/09/2018   Procedure: APPLICATION OF ROBOTIC ASSISTANCE FOR SPINAL PROCEDURE;  Surgeon: Kristeen Miss, MD;  Location: [place];  Service: Neurosurgery;  Laterality: N/A;  . BACK SURGERY     2017  discectomy  . CARPAL TUNNEL RELEASE Left 07/04/2014   Procedure: LEFT CARPAL TUNNEL RELEASE;  Surgeon: Ninetta Lights, MD;  Location: [place];  Service: Orthopedics;  Laterality: Left;  . CARPAL TUNNEL RELEASE Right 08/08/2014   Procedure: RIGHT CARPAL TUNNEL RELEASE;  Surgeon: Ninetta Lights, MD;  Location: [place];  Service: Orthopedics;  Laterality: Right;  . DILATION AND CURETTAGE OF UTERUS    . DILATION AND EVACUATION  04/02/2011   Procedure: DILATATION AND EVACUATION (D&E);  Surgeon: Luz Lex, MD;  Location: [facility];  Service: Gynecology;  Laterality: N/A;  dvt left mid thigh  . DORSAL COMPARTMENT RELEASE Left 07/04/2014   Procedure: LEFT DEQUERVAINS;  Surgeon: Ninetta Lights, MD;  Location: [place];  Service: Orthopedics;  Laterality: Left;  . ENDOSCOPIC PLANTAR FASCIOTOMY Left 02/01/2002  . HARDWARE REMOVAL Right 06/09/2018  Procedure: Repositioning of Right Lumbar three Right lumbar  four pedicle screw with MAZOR;  Surgeon: Kristeen Miss, MD;  Location: [place];  Service: Neurosurgery;  Laterality: Right;  . HYSTEROSCOPY W/D&C  07/17/2010   with exc. endometrial polyps  . KNEE ARTHROSCOPY Right 03/07/2003; 01/14/2005; 08/31/2006  . KNEE ARTHROSCOPY Left 03/21/2003; 11/26/2004  . LUMBAR PERCUTANEOUS PEDICLE SCREW 3 LEVEL N/A 02/17/2018   Procedure: LUMBAR PERCUTANEOUS PEDICLE SCREW PLACEMENT LUMBAR THREE-SACRAL ONE;  Surgeon: Kristeen Miss, MD;  Location: [place];  Service:  Neurosurgery;  Laterality: N/A;  . SHOULDER ARTHROSCOPY Right   . TOE SURGERY Left 01/10/2003   claw toe correction 2nd, 3rd, 4th toes  . TOTAL HIP ARTHROPLASTY Right 04/21/2016  . TOTAL HIP ARTHROPLASTY Right 04/21/2016   Procedure: TOTAL HIP ARTHROPLASTY ANTERIOR APPROACH;  Surgeon: Ninetta Lights, MD;  Location: [place];  Service: Orthopedics;  Laterality: Right;  . TOTAL KNEE ARTHROPLASTY Left 01/21/2010  . TOTAL KNEE ARTHROPLASTY Right 08/26/2008    There were no vitals filed for this visit.  Subjective Assessment - 07/21/18 0941    Subjective  Reports being "achy" to R hip and lower back region. Reports increased tightness to bilateral trap region.     Pertinent History  Past Surgical Hx: B TKA (2009, 2011) and R THA 2017. PMH includes; OA, Morbid Obesity, Depression, HTN, Lumbar Spondylosis, Polycystic Ovarian Syndrome, Pneumonia, and PONV    Limitations  Standing;Walking;Lifting;Sitting    How long can you sit comfortably?  Sitting is uncomfortable. No amount of time specified.     How long can you stand comfortably?  about 5-10 mins    How long can you walk comfortably?  Short period of time with no time period specified    Patient Stated Goals  Reports goal of being more mobile with increased flexibility. Wants to "atleast put lotion on her feet."    Currently in Pain?  Yes    Pain Score  4     Pain Location  Generalized    Pain Orientation  Right;Mid;Lower    Pain Descriptors / Indicators  Aching    Pain Type  Chronic pain    Pain Onset  More than a month ago    Pain Frequency  Constant    Aggravating Factors   activity     Pain Relieving Factors  heating pad             [facility] Adult PT Treatment/Exercise - 07/21/18 1039      Neuro Re-ed    Neuro Re-ed Details   Concurrent to bioness on/off time to R quad. Pt performs 2x6 reps of R LE eccentric quads from 4 inch step with rest break in between. Pt then performs 1x5 reps of stepping over black beam leading with R LE during  on phase and returning to standing position during off phase. Pt progresses to 1x6 reps of lateral stepping over beam. Pt demonstrates increased challenge with hip flexion activation demonstrating observable facial grimacing and compensatory lateral sway with therapist cueing to maintain upright posture.       Exercises   Exercises  Neck;Knee/Hip;Other Exercises    Other Exercises   Pt performs 2x30 sec of seated trap stretch bilaterally s/p dry needling      Knee/Hip Exercises: Stretches   Hip Flexor Stretch  Right;1 rep;60 seconds      Modalities   Modalities  Electrical Stimulation      Electrical Stimulation   Electrical Stimulation Location  R LE quad and tibialis  anterior. Only upper cuff providing stimulation to quad.     Electrical Stimulation Action  increased quad and hip flexor activation during eccentric quad exercise from 4 inch step and forward/ lateral stepping over beam    Electrical Stimulation Parameters  see parameters on tablet 2    Electrical Stimulation Goals  Strength;Neuromuscular facilitation             PT Education - 07/21/18 1044    Education Details  Therapist provided education regarding implications and potential side effects of trigger point dry needling and the importance of performing cervical trap stretch bilaterally s/p treatment to reduce tightness.     Person(s) Educated  Patient    Methods  Explanation    Comprehension  Verbalized understanding       PT Short Term Goals - 07/14/18 1313      PT SHORT TERM GOAL #1   Title  Pt will initiate HEP in order to indicate improved functional mobility and decreased fall risk.    Time  4    Period  Weeks    Status  New      PT SHORT TERM GOAL #2   Title  Pt will improve gait speed to >/= 2.57 ft/sec with LRAD in order to indicate decreased fall risk     Baseline  10/14: 2.27 ft/ sec with no AD    Time  4    Period  Weeks    Status  New      PT SHORT TERM GOAL #3   Title  Pt will perform  5TSS </=17.57 secs with single UE support only in order to indicate decreased fall risk and improved functional strength.      Baseline  10/14: 22.74 from standard height chair with B UE assist     Time  4    Period  Weeks    Status  New      PT SHORT TERM GOAL #4   Title  Pt will improve BERG balance score to >/=34/56 in order to indicate decreased fall risk.     Baseline  10/14: 30/56 indicative of high fall risk potential     Time  4    Period  Weeks    Status  New      PT SHORT TERM GOAL #5   Title  Pt will negotiate up/down 8 steps with bilateral rails with supervision in order to indicate improved LE strength    Time  4    Period  Weeks    Status  New      PT SHORT TERM GOAL #6   Title  Pt will ambulate 300' on uneven surfaces, ramps, curbs, inclines, demonstrating ability to vary gait speed when cued using LRAD and therapist providing supervision to improve safety with community distances     Time  4    Period  Weeks    Status  New      PT SHORT TERM GOAL #7   Title  Pt will participate in 6 min walk test to assess baseline endurance with functional mobility     Time  4    Period  Weeks    Status  Achieved        PT Long Term Goals - 07/14/18 1314      PT LONG TERM GOAL #1   Title  Pt will be independent with final HEP in order to indicate improved functional mobilty and decreased fall risk.     Time  8    Period  Weeks    Status  New      PT LONG TERM GOAL #2   Title  Pt will improve gait speed to >/=3.17 ft/sec w/ LRAD in order to indicate decreased fall risk and safe community ambulator.     Period  Weeks    Status  New      PT LONG TERM GOAL #3   Title  Pt will perform 5TSS in </=14 secs without UE support in order to indicate decreased fall risk and improved functional strength.     Time  8    Period  Weeks    Status  New      PT LONG TERM GOAL #4   Title  Pt will improve BERG balance score to >/=38/56 in order to indicate decreased fall risk.       Time  8    Period  Weeks    Status  New      PT LONG TERM GOAL #5   Title  Pt will negotiate 8 steps with single HR and LRAD utilizing reciprocal stepping technique at Mod I level to increase safety with community accessibility.     Time  8    Period  Weeks    Status  New      PT LONG TERM GOAL #6   Title  Pt will ambulate 500 feet outdoors with LRAD navigating uneven terrain, curbs, ramps, and demonstrating ability to vary gait speed at Mod I level to improve functional mobility.     Time  8    Period  Weeks    Status  New      PT LONG TERM GOAL #7   Title  Pt will improve 6 min walk test by 150' indicating improvement with functional endurance    10/25: Patient performs 675 with rollator    Time  8    Period  Weeks    Status  Revised            Plan - 07/21/18 1045    Clinical Impression Statement  Today's skilled session focused on trigger point dry needling to bilateral cervical trap region for reduction of muscle tightness and R quad/ hip flexor strengthening with bioness upper thigh cuff. Pt tolerated session well and reports reduction of cervical muscle tightness s/p dry needling treatment. Pt able to tolerate quad and hip flexor strengthening with UE assist in // bars 2/2 fatigue and weakness. Pt will continue to benefit from skilled PT to address strengthening, balance, and functional mobility deficits.     Rehab Potential  Good    Clinical Impairments Affecting Rehab Potential  Pt is motivated to improve. Strength gains may be limited 2/2 to nerve regeneration process     PT Frequency  2x / week    PT Duration  8 weeks    PT Treatment/Interventions  ADLs/Self Care Home Management;Aquatic Therapy;DME Instruction;Gait training;Stair training;Functional mobility training;Therapeutic activities;Therapeutic exercise;Balance training;Neuromuscular re-education;Patient/family education;Electrical Stimulation;Passive range of motion;Energy conservation    PT Next Visit Plan   How does patient feel s/p TPDN, update on rollator, print out and add cervical trap and counter top gastroc stretch to HEP and teach patient, cone tapping in L SLS with bioness to R LE maintaining hip flexion during movement, SLR for quad, bridges, gait, continue with use of Bioness to right quad with gait/excercises/NMR    PT Home Exercise Plan   [id number]     Consulted and Agree with Plan of  Care  Patient       Patient will benefit from skilled therapeutic intervention in order to improve the following deficits and impairments:  Abnormal gait, Decreased activity tolerance, Decreased balance, Decreased coordination, Decreased endurance, Decreased mobility, Decreased range of motion, Decreased strength, Impaired perceived functional ability, Impaired flexibility, Impaired sensation, Pain, Decreased knowledge of use of DME, Decreased knowledge of precautions, Obesity  Visit Diagnosis: Muscle weakness (generalized)  Pain     Problem List Patient Active Problem List   Diagnosis Date Noted  . Lumbosacral radiculopathy at L4 06/09/2018  . Generalized anxiety disorder   . Pain   . Fall   . Labile blood pressure   . Neuropathic pain   . Therapeutic opioid induced constipation   . Urinary retention   . Lumbar radiculopathy 02/21/2018  . PCOS (polycystic ovarian syndrome)   . Morbid obesity ([place])   . Depression   . Generalized OA   . Postoperative pain   . Acute blood loss anemia   . Status post surgery 02/17/2018  . Lumbar radiculopathy, chronic 02/17/2018  . Primary localized osteoarthritis of right hip 04/21/2016  . Sinusitis 06/25/2015  . Cough 06/25/2015  . Essential hypertension 04/03/2015  . Dehydration 04/03/2015  . CAP (community acquired pneumonia) 03/31/2015  . Right lower lobe pneumonia ([place]) 03/31/2015  . Fever 03/31/2015  . Pregnancy 04/29/2014  . Miscarriage   . Incomplete miscarriage 04/02/2011    Class: Acute  . Deep vein thrombosis (DVT) ([place]) 04/02/2011     Class: Chronic    Floreen Comber, SPT 07/21/2018, 10:50 AM  [facility] [address] [place], [place], [address] Phone: [phone]   Fax:  [phone]  Name: ENRIQUE WEISS MRN: [id number] Date of Birth: 1976-05-07

## 2018-07-21 NOTE — Telephone Encounter (Signed)
I placed the order.  Thanks.

## 2018-07-24 ENCOUNTER — Encounter: Payer: Self-pay | Admitting: Rehabilitation

## 2018-07-24 ENCOUNTER — Ambulatory Visit: Payer: BLUE CROSS/BLUE SHIELD | Admitting: Rehabilitation

## 2018-07-24 DIAGNOSIS — G8929 Other chronic pain: Secondary | ICD-10-CM

## 2018-07-24 DIAGNOSIS — M6281 Muscle weakness (generalized): Secondary | ICD-10-CM | POA: Diagnosis not present

## 2018-07-24 DIAGNOSIS — M545 Low back pain, unspecified: Secondary | ICD-10-CM

## 2018-07-24 DIAGNOSIS — R2681 Unsteadiness on feet: Secondary | ICD-10-CM

## 2018-07-24 NOTE — Patient Instructions (Signed)
Access Code: [email or web address]  URL: [email or web address]  Date: 07/24/2018  Prepared by: Cameron Sprang   Exercises  Seated Hamstring Stretch - 3 sets - 30 hold - 1x daily - 7x weekly  Seated Scapular Retraction - 10 reps - 2 sets - 2-3 hold - 1x daily - 7x weekly  Hip Flexor Stretch at Edge of Bed - 3 sets - 60 hold - 1x daily - 7x weekly  Sidelying ITB Stretch off Table - 3 reps - 1 sets - 30-60 hold - 1x daily - 7x weekly  Sidestepping in Squat with Resistance and Arms Forward - 4 reps - 1 sets - 1x daily - 7x weekly

## 2018-07-24 NOTE — Therapy (Signed)
[facility] [address] [place], [place], [address] Phone: [phone]   Fax:  [phone]  Physical Therapy Treatment  Patient Details  Name: [place] [name] MRN: [id number] Date of Birth: Nov 25, 1975 Referring Provider (PT): Dr. Kristeen Miss    Encounter Date: 07/24/2018  PT End of Session - 07/24/18 1656    Visit Number  6    Number of Visits  17    Date for PT Re-Evaluation  09/01/18    Authorization Type  [id number] 22 visits remaining    Authorization - Visit Number  6    Authorization - Number of Visits  22    PT Start Time  1696    PT Stop Time  1402    PT Time Calculation (min)  44 min    Equipment Utilized During Treatment  Other (comment);Gait belt   bioness   Activity Tolerance  Patient tolerated treatment well;Patient limited by fatigue    Behavior During Therapy  [facility] for tasks assessed/performed       Past Medical History:  Diagnosis Date  . Arthritis    knees, back  . 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 Complication of anesthesia   . Depression   . History of pneumonia   . Hypertension   . Lumbar spondylosis   . PCOS (polycystic ovarian syndrome)   . Pneumonia    2016  . PONV (postoperative nausea and vomiting)     Past Surgical History:  Procedure Laterality Date  . ABDOMINAL EXPOSURE N/A 02/17/2018   Procedure: ABDOMINAL EXPOSURE;  Surgeon: Rosetta Posner, MD;  Location: [facility] OR;  Service: Vascular;  Laterality: N/A;  . ANKLE SURGERY     x 2  . ANTERIOR LAT LUMBAR FUSION N/A 02/17/2018   Procedure: Lumbar three-four Lumbar four-five  Anterolateral lumbar interbody fusion;  Surgeon: Kristeen Miss, MD;  Location: [place];  Service: Neurosurgery;  Laterality: N/A;  . ANTERIOR LUMBAR FUSION N/A 02/17/2018   Procedure: Lumbar five-Sacral one Anterior lumbar interbody fusion;  Surgeon: Kristeen Miss, MD;  Location: [place];  Service: Neurosurgery;  Laterality: N/A;  . APPLICATION OF  ROBOTIC ASSISTANCE FOR SPINAL PROCEDURE N/A 02/17/2018   Procedure: APPLICATION OF ROBOTIC ASSISTANCE FOR SPINAL PROCEDURE;  Surgeon: Kristeen Miss, MD;  Location: [place];  Service: Neurosurgery;  Laterality: N/A;  . APPLICATION OF ROBOTIC ASSISTANCE FOR SPINAL PROCEDURE N/A 06/09/2018   Procedure: APPLICATION OF ROBOTIC ASSISTANCE FOR SPINAL PROCEDURE;  Surgeon: Kristeen Miss, MD;  Location: [place];  Service: Neurosurgery;  Laterality: N/A;  . BACK SURGERY     2017  discectomy  . CARPAL TUNNEL RELEASE Left 07/04/2014   Procedure: LEFT CARPAL TUNNEL RELEASE;  Surgeon: Ninetta Lights, MD;  Location: [place];  Service: Orthopedics;  Laterality: Left;  . CARPAL TUNNEL RELEASE Right 08/08/2014   Procedure: RIGHT CARPAL TUNNEL RELEASE;  Surgeon: Ninetta Lights, MD;  Location: [place];  Service: Orthopedics;  Laterality: Right;  . DILATION AND CURETTAGE OF UTERUS    . DILATION AND EVACUATION  04/02/2011   Procedure: DILATATION AND EVACUATION (D&E);  Surgeon: Luz Lex, MD;  Location: [facility];  Service: Gynecology;  Laterality: N/A;  dvt left mid thigh  . DORSAL COMPARTMENT RELEASE Left 07/04/2014   Procedure: LEFT DEQUERVAINS;  Surgeon: Ninetta Lights, MD;  Location: [facility];  Service: Orthopedics;  Laterality: Left;  . ENDOSCOPIC PLANTAR FASCIOTOMY Left 02/01/2002  . HARDWARE REMOVAL Right 06/09/2018  Procedure: Repositioning of Right Lumbar three Right lumbar  four pedicle screw with MAZOR;  Surgeon: Kristeen Miss, MD;  Location: [place];  Service: Neurosurgery;  Laterality: Right;  . HYSTEROSCOPY W/D&C  07/17/2010   with exc. endometrial polyps  . KNEE ARTHROSCOPY Right 03/07/2003; 01/14/2005; 08/31/2006  . KNEE ARTHROSCOPY Left 03/21/2003; 11/26/2004  . LUMBAR PERCUTANEOUS PEDICLE SCREW 3 LEVEL N/A 02/17/2018   Procedure: LUMBAR PERCUTANEOUS PEDICLE SCREW PLACEMENT LUMBAR THREE-SACRAL ONE;  Surgeon: Kristeen Miss, MD;  Location: [facility];  Service:  Neurosurgery;  Laterality: N/A;  . SHOULDER ARTHROSCOPY Right   . TOE SURGERY Left 01/10/2003   claw toe correction 2nd, 3rd, 4th toes  . TOTAL HIP ARTHROPLASTY Right 04/21/2016  . TOTAL HIP ARTHROPLASTY Right 04/21/2016   Procedure: TOTAL HIP ARTHROPLASTY ANTERIOR APPROACH;  Surgeon: Ninetta Lights, MD;  Location: [place];  Service: Orthopedics;  Laterality: Right;  . TOTAL KNEE ARTHROPLASTY Left 01/21/2010  . TOTAL KNEE ARTHROPLASTY Right 08/26/2008    There were no vitals filed for this visit.  Subjective Assessment - 07/24/18 1644    Subjective  Pt reports she got rollator and has been using.  Also reports she tolerated dry needling well and has been stretching.     Pertinent History  Past Surgical Hx: B TKA (2009, 2011) and R THA 2017. PMH includes; OA, Morbid Obesity, Depression, HTN, Lumbar Spondylosis, Polycystic Ovarian Syndrome, Pneumonia, and PONV    Limitations  Standing;Walking;Lifting;Sitting    How long can you sit comfortably?  Sitting is uncomfortable. No amount of time specified.     How long can you stand comfortably?  about 5-10 mins    How long can you walk comfortably?  Short period of time with no time period specified    Patient Stated Goals  Reports goal of being more mobile with increased flexibility. Wants to "atleast put lotion on her feet."    Currently in Pain?  Yes    Pain Score  4     Pain Location  Generalized    Pain Orientation  Right;Lower;Mid    Pain Descriptors / Indicators  Aching    Pain Type  Chronic pain    Pain Onset  More than a month ago    Pain Frequency  Constant    Aggravating Factors   activity    Pain Relieving Factors  heating pad                       [facility] Adult PT Treatment/Exercise - 07/24/18 1325      Transfers   Transfers  Sit to Stand;Stand to Sit    Sit to Stand  5: Supervision    Stand to Sit  5: Supervision    Comments  Performed for RLE strengthening with cues for squatting as low as possible then return  slowly to stand.  Performed x 10 reps.       Ambulation/Gait   Ambulation/Gait  Yes    Ambulation/Gait Assistance  5: Supervision    Ambulation/Gait Assistance Details  Cues for landing softer on LLE to increase time spent in R stance phase of gait. Also provided cues for increased stride length and gait speed.  Pt able to do this very well with marked improvement in fluidity of gait during session.  Note that first 200' of gait was done with Bioness, however last 35' was done without and pt able to maintain good speed and fluid movement. Min cues for relaxed posture/shoulders.  Ambulation Distance (Feet)  200 Feet   then 115   Assistive device  None    Gait Pattern  Step-through pattern;Decreased hip/knee flexion - right;Decreased arm swing - right;Decreased arm swing - left;Decreased stride length;Decreased hip/knee flexion - left;Decreased dorsiflexion - right;Decreased dorsiflexion - left;Wide base of support;Decreased trunk rotation;Decreased weight shift to right    Ambulation Surface  Level;Indoor    Stairs  Yes    Stairs Assistance  5: Supervision    Stairs Assistance Details (indicate cue type and reason)  Pt able to complete stairs x 3 reps today with single rail in reciprocal pattern.  Note that descent with LLE is difficult due to lack of eccentric control in RLE.      Stair Management Technique  One rail Right;Alternating pattern;Forwards    Number of Stairs  12    Height of Stairs  6      Neuro Re-ed    Neuro Re-ed Details   Utilized R thigh Bioness unit during R NMR tasks for improved R quad/hip flex activation.  Performed side stepping squats in // bars x 4 laps with light UE support, tapping RLE to two cones placed to the R with cues for improved hip flex from one cone to the next x 8 reps total, wall squats x 8 reps with 3 sec holds, wall squats with L heel raise x 3 reps while in squat x 8 reps total.   did not use Bioness for remaining exercises:  attempted to perform hip  flex with RLE off EOM to return to mat, however this was too difficult, therefore provided light assist x 5 reps.   See below for hip stretching.  Ended with sit<>stand as stated above.       Exercises   Exercises  Other Exercises    Other Exercises   Supine R hip flex stretch off EOM x 2 mins.  Note very tight TFL therefore performed passive stretch with PT bringing RLE across body x 45 secs progressing to L sidelying lowering RLE posteriorly with PT maintaining position and safety at hips x 2 mins.  Provided this for HEP.        Modalities   Modalities  Teacher, English as a foreign language Location  R LE quad and tibialis anterior. Only upper cuff providing stimulation to quad.     Electrical Stimulation Action  Increased R quad and hip flex activation    Electrical Stimulation Parameters  See tablet 2    Electrical Stimulation Goals  Strength;Neuromuscular facilitation             PT Education - 07/24/18 1656    Education Details  additional HEP exercises.     Person(s) Educated  Patient    Methods  Explanation;Handout    Comprehension  Verbalized understanding;Returned demonstration       PT Short Term Goals - 07/14/18 1313      PT SHORT TERM GOAL #1   Title  Pt will initiate HEP in order to indicate improved functional mobility and decreased fall risk.    Time  4    Period  Weeks    Status  New      PT SHORT TERM GOAL #2   Title  Pt will improve gait speed to >/= 2.57 ft/sec with LRAD in order to indicate decreased fall risk     Baseline  10/14: 2.27 ft/ sec with no AD    Time  4  Period  Weeks    Status  New      PT SHORT TERM GOAL #3   Title  Pt will perform 5TSS </=17.57 secs with single UE support only in order to indicate decreased fall risk and improved functional strength.      Baseline  10/14: 22.74 from standard height chair with B UE assist     Time  4    Period  Weeks    Status  New      PT SHORT TERM GOAL  #4   Title  Pt will improve BERG balance score to >/=34/56 in order to indicate decreased fall risk.     Baseline  10/14: 30/56 indicative of high fall risk potential     Time  4    Period  Weeks    Status  New      PT SHORT TERM GOAL #5   Title  Pt will negotiate up/down 8 steps with bilateral rails with supervision in order to indicate improved LE strength    Time  4    Period  Weeks    Status  New      PT SHORT TERM GOAL #6   Title  Pt will ambulate 300' on uneven surfaces, ramps, curbs, inclines, demonstrating ability to vary gait speed when cued using LRAD and therapist providing supervision to improve safety with community distances     Time  4    Period  Weeks    Status  New      PT SHORT TERM GOAL #7   Title  Pt will participate in 6 min walk test to assess baseline endurance with functional mobility     Time  4    Period  Weeks    Status  Achieved        PT Long Term Goals - 07/14/18 1314      PT LONG TERM GOAL #1   Title  Pt will be independent with final HEP in order to indicate improved functional mobilty and decreased fall risk.     Time  8    Period  Weeks    Status  New      PT LONG TERM GOAL #2   Title  Pt will improve gait speed to >/=3.17 ft/sec w/ LRAD in order to indicate decreased fall risk and safe community ambulator.     Period  Weeks    Status  New      PT LONG TERM GOAL #3   Title  Pt will perform 5TSS in </=14 secs without UE support in order to indicate decreased fall risk and improved functional strength.     Time  8    Period  Weeks    Status  New      PT LONG TERM GOAL #4   Title  Pt will improve BERG balance score to >/=38/56 in order to indicate decreased fall risk.      Time  8    Period  Weeks    Status  New      PT LONG TERM GOAL #5   Title  Pt will negotiate 8 steps with single HR and LRAD utilizing reciprocal stepping technique at Mod I level to increase safety with community accessibility.     Time  8    Period  Weeks     Status  New      PT LONG TERM GOAL #6   Title  Pt will ambulate 500 feet outdoors with  LRAD navigating uneven terrain, curbs, ramps, and demonstrating ability to vary gait speed at Mod I level to improve functional mobility.     Time  8    Period  Weeks    Status  New      PT LONG TERM GOAL #7   Title  Pt will improve 6 min walk test by 150' indicating improvement with functional endurance    10/25: Patient performs 675 with rollator    Time  8    Period  Weeks    Status  Revised            Plan - 07/24/18 1657    Clinical Impression Statement  Skilled session continues to focus on RLE strengthening (hip flexor and quad) with use of Bioness, along with gait with and without Bioness.  Pt able to carryover improved gait pattern/quality today without Bioness with less cues from therapist.     Rehab Potential  Good    Clinical Impairments Affecting Rehab Potential  Pt is motivated to improve. Strength gains may be limited 2/2 to nerve regeneration process     PT Frequency  2x / week    PT Duration  8 weeks    PT Treatment/Interventions  ADLs/Self Care Home Management;Aquatic Therapy;DME Instruction;Gait training;Stair training;Functional mobility training;Therapeutic activities;Therapeutic exercise;Balance training;Neuromuscular re-education;Patient/family education;Electrical Stimulation;Passive range of motion;Energy conservation    PT Next Visit Plan  Set up her NMES unit and give exercises for home, How does patient feel s/p TPDN, print out and add cervical trap and counter top gastroc stretch to HEP and teach patient, cone tapping in L SLS with bioness to R LE maintaining hip flexion during movement, SLR for quad, bridges, gait, continue with use of Bioness to right quad with gait/excercises/NMR    PT Home Exercise Plan   [id number]     Consulted and Agree with Plan of Care  Patient       Patient will benefit from skilled therapeutic intervention in order to improve the following  deficits and impairments:  Abnormal gait, Decreased activity tolerance, Decreased balance, Decreased coordination, Decreased endurance, Decreased mobility, Decreased range of motion, Decreased strength, Impaired perceived functional ability, Impaired flexibility, Impaired sensation, Pain, Decreased knowledge of use of DME, Decreased knowledge of precautions, Obesity  Visit Diagnosis: Unsteadiness on feet  Muscle weakness (generalized)  Chronic midline low back pain without sciatica     Problem List Patient Active Problem List   Diagnosis Date Noted  . Lumbosacral radiculopathy at L4 06/09/2018  . Generalized anxiety disorder   . Pain   . Fall   . Labile blood pressure   . Neuropathic pain   . Therapeutic opioid induced constipation   . Urinary retention   . Lumbar radiculopathy 02/21/2018  . PCOS (polycystic ovarian syndrome)   . Morbid obesity ([place])   . Depression   . Generalized OA   . Postoperative pain   . Acute blood loss anemia   . Status post surgery 02/17/2018  . Lumbar radiculopathy, chronic 02/17/2018  . Primary localized osteoarthritis of right hip 04/21/2016  . Sinusitis 06/25/2015  . Cough 06/25/2015  . Essential hypertension 04/03/2015  . Dehydration 04/03/2015  . CAP (community acquired pneumonia) 03/31/2015  . Right lower lobe pneumonia ([place]) 03/31/2015  . Fever 03/31/2015  . Pregnancy 04/29/2014  . Miscarriage   . Incomplete miscarriage 04/02/2011    Class: Acute  . 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

## 2018-07-28 ENCOUNTER — Encounter: Payer: Self-pay | Admitting: Physical Therapy

## 2018-07-28 ENCOUNTER — Ambulatory Visit: Payer: BLUE CROSS/BLUE SHIELD | Admitting: Physical Therapy

## 2018-07-28 DIAGNOSIS — G8929 Other chronic pain: Secondary | ICD-10-CM

## 2018-07-28 DIAGNOSIS — M6281 Muscle weakness (generalized): Secondary | ICD-10-CM | POA: Diagnosis not present

## 2018-07-28 DIAGNOSIS — M545 Low back pain, unspecified: Secondary | ICD-10-CM

## 2018-07-28 DIAGNOSIS — R2681 Unsteadiness on feet: Secondary | ICD-10-CM

## 2018-07-28 NOTE — Patient Instructions (Addendum)
Access Code: [email or web address]  URL: [email or web address]  Date: 07/28/2018  Prepared by: Misty [place]   Exercises  Seated Scapular Retraction - 10 reps - 2 sets - 2-3 hold - 1x daily - 7x weekly  Hip Flexor Stretch at Edge of Bed - 3 sets - 60 hold - 1x daily - 7x weekly  Sidelying ITB Stretch off Table - 3 reps - 1 sets - 30-60 hold - 1x daily - 7x weekly  Sidestepping in Squat with Resistance and Arms Forward - 4 reps - 1 sets - 1x daily - 7x weekly  Upper Trapezius Stretch - 10 reps - 3 sets - 1x daily - 7x weekly  Seated Hamstring Stretch (BKA) - 10 reps - 3 sets - 1x daily - 7x weekly  Beginner Bridge - 10 reps - 2 sets - 1x daily - 7x weekly  Single Leg Bridge - 5 reps - 2 sets - 1x daily - 7x weekly  Isometric Gluteus Medius at Wall - 10 reps - 2 sets - 1x daily - 7x weekly

## 2018-07-28 NOTE — Therapy (Signed)
[facility] [address] [place], [place], [address] Phone: [phone]   Fax:  [phone]  Physical Therapy Treatment  Patient Details  Name: Ashley [name] MRN: [id number] Date of Birth: March 03, 1976 Referring Provider (PT): Dr. Kristeen Miss    Encounter Date: 07/28/2018  PT End of Session - 07/28/18 1300    Visit Number  7    Number of Visits  17    Date for PT Re-Evaluation  09/01/18    Authorization Type  [id number] 22 visits remaining    Authorization - Visit Number  7    Authorization - Number of Visits  22    PT Start Time  0850    PT Stop Time  0935    PT Time Calculation (min)  45 min    Activity Tolerance  Patient tolerated treatment well;Patient limited by fatigue    Behavior During Therapy  [facility] for tasks assessed/performed       Past Medical History:  Diagnosis Date  . Arthritis    knees, back  . 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 Complication of anesthesia   . Depression   . History of pneumonia   . Hypertension   . Lumbar spondylosis   . PCOS (polycystic ovarian syndrome)   . Pneumonia    2016  . PONV (postoperative nausea and vomiting)     Past Surgical History:  Procedure Laterality Date  . ABDOMINAL EXPOSURE N/A 02/17/2018   Procedure: ABDOMINAL EXPOSURE;  Surgeon: Rosetta Posner, MD;  Location: [facility] OR;  Service: Vascular;  Laterality: N/A;  . ANKLE SURGERY     x 2  . ANTERIOR LAT LUMBAR FUSION N/A 02/17/2018   Procedure: Lumbar three-four Lumbar four-five  Anterolateral lumbar interbody fusion;  Surgeon: Kristeen Miss, MD;  Location: [facility];  Service: Neurosurgery;  Laterality: N/A;  . ANTERIOR LUMBAR FUSION N/A 02/17/2018   Procedure: Lumbar five-Sacral one Anterior lumbar interbody fusion;  Surgeon: Kristeen Miss, MD;  Location: [facility];  Service: Neurosurgery;  Laterality: N/A;  . APPLICATION OF ROBOTIC ASSISTANCE FOR SPINAL PROCEDURE N/A 02/17/2018   Procedure: APPLICATION OF  ROBOTIC ASSISTANCE FOR SPINAL PROCEDURE;  Surgeon: Kristeen Miss, MD;  Location: [facility];  Service: Neurosurgery;  Laterality: N/A;  . APPLICATION OF ROBOTIC ASSISTANCE FOR SPINAL PROCEDURE N/A 06/09/2018   Procedure: APPLICATION OF ROBOTIC ASSISTANCE FOR SPINAL PROCEDURE;  Surgeon: Kristeen Miss, MD;  Location: [facility];  Service: Neurosurgery;  Laterality: N/A;  . BACK SURGERY     2017  discectomy  . CARPAL TUNNEL RELEASE Left 07/04/2014   Procedure: LEFT CARPAL TUNNEL RELEASE;  Surgeon: Ninetta Lights, MD;  Location: [facility];  Service: Orthopedics;  Laterality: Left;  . CARPAL TUNNEL RELEASE Right 08/08/2014   Procedure: RIGHT CARPAL TUNNEL RELEASE;  Surgeon: Ninetta Lights, MD;  Location: [facility];  Service: Orthopedics;  Laterality: Right;  . DILATION AND CURETTAGE OF UTERUS    . DILATION AND EVACUATION  04/02/2011   Procedure: DILATATION AND EVACUATION (D&E);  Surgeon: Luz Lex, MD;  Location: [facility];  Service: Gynecology;  Laterality: N/A;  dvt left mid thigh  . DORSAL COMPARTMENT RELEASE Left 07/04/2014   Procedure: LEFT DEQUERVAINS;  Surgeon: Ninetta Lights, MD;  Location: [facility];  Service: Orthopedics;  Laterality: Left;  . ENDOSCOPIC PLANTAR FASCIOTOMY Left 02/01/2002  . HARDWARE REMOVAL Right 06/09/2018   Procedure: Repositioning of Right Lumbar three Right lumbar  four pedicle  screw with MAZOR;  Surgeon: Kristeen Miss, MD;  Location: [facility];  Service: Neurosurgery;  Laterality: Right;  . HYSTEROSCOPY W/D&C  07/17/2010   with exc. endometrial polyps  . KNEE ARTHROSCOPY Right 03/07/2003; 01/14/2005; 08/31/2006  . KNEE ARTHROSCOPY Left 03/21/2003; 11/26/2004  . LUMBAR PERCUTANEOUS PEDICLE SCREW 3 LEVEL N/A 02/17/2018   Procedure: LUMBAR PERCUTANEOUS PEDICLE SCREW PLACEMENT LUMBAR THREE-SACRAL ONE;  Surgeon: Kristeen Miss, MD;  Location: [facility];  Service: Neurosurgery;  Laterality: N/A;  . SHOULDER ARTHROSCOPY Right   . TOE SURGERY Left  01/10/2003   claw toe correction 2nd, 3rd, 4th toes  . TOTAL HIP ARTHROPLASTY Right 04/21/2016  . TOTAL HIP ARTHROPLASTY Right 04/21/2016   Procedure: TOTAL HIP ARTHROPLASTY ANTERIOR APPROACH;  Surgeon: Ninetta Lights, MD;  Location: [facility];  Service: Orthopedics;  Laterality: Right;  . TOTAL KNEE ARTHROPLASTY Left 01/21/2010  . TOTAL KNEE ARTHROPLASTY Right 08/26/2008    There were no vitals filed for this visit.  Subjective Assessment - 07/28/18 0852    Subjective  Still using the rollator for long distances to help with endurance.  Stretches on Monday for TFL made her sore for two days.      Pertinent History  Past Surgical Hx: B TKA (2009, 2011) and R THA 2017. PMH includes; OA, Morbid Obesity, Depression, HTN, Lumbar Spondylosis, Polycystic Ovarian Syndrome, Pneumonia, and PONV    Limitations  Standing;Walking;Lifting;Sitting    How long can you sit comfortably?  Sitting is uncomfortable. No amount of time specified.     How long can you stand comfortably?  about 5-10 mins    How long can you walk comfortably?  Short period of time with no time period specified    Patient Stated Goals  Reports goal of being more mobile with increased flexibility. Wants to "atleast put lotion on her feet."    Currently in Pain?  Yes    Pain Score  4     Pain Location  Generalized    Pain Descriptors / Indicators  Discomfort    Pain Onset  More than a month ago       Access Code: [email or web address]  URL: [email or web address]  Date: 07/28/2018  Prepared by: Misty Stanley   Exercises reviewed and added to HEP today: Upper Trapezius Stretch - 10 reps - 3 sets - 1x daily - 7x weekly  Seated Hamstring and Gastroc Stretch - 10 reps - 3 sets - 1x daily - 7x weekly  Beginner Bridge - 10 reps - 2 sets - 1x daily - 7x weekly  Single Leg Bridge - 5 reps - 2 sets - 1x daily - 7x weekly  Isometric Gluteus Medius at Wall - 10 reps - 2 sets - 1x daily - 7x weekly     PT Education - 07/28/18 1300     Education Details  continued to focus on proximal hip strengthening with HEP, scheduled TDN of TFL    Person(s) Educated  Patient    Methods  Explanation;Demonstration;Handout    Comprehension  Verbalized understanding;Returned demonstration       PT Short Term Goals - 07/28/18 1306      PT SHORT TERM GOAL #1   Title  Pt will initiate HEP in order to indicate improved functional mobility and decreased fall risk.  (STG DUE 11/13)    Time  4    Period  Weeks    Status  New      PT SHORT TERM GOAL #2   Title  Pt will improve  gait speed to >/= 2.57 ft/sec with LRAD in order to indicate decreased fall risk     Baseline  10/14: 2.27 ft/ sec with no AD    Time  4    Period  Weeks    Status  New      PT SHORT TERM GOAL #3   Title  Pt will perform 5TSS </=17.57 secs with single UE support only in order to indicate decreased fall risk and improved functional strength.      Baseline  10/14: 22.74 from standard height chair with B UE assist     Time  4    Period  Weeks    Status  New      PT SHORT TERM GOAL #4   Title  Pt will improve BERG balance score to >/=34/56 in order to indicate decreased fall risk.     Baseline  10/14: 30/56 indicative of high fall risk potential     Time  4    Period  Weeks    Status  New      PT SHORT TERM GOAL #5   Title  Pt will negotiate up/down 8 steps with bilateral rails with supervision in order to indicate improved LE strength    Time  4    Period  Weeks    Status  New      PT SHORT TERM GOAL #6   Title  Pt will ambulate 300' on uneven surfaces, ramps, curbs, inclines, demonstrating ability to vary gait speed when cued using LRAD and therapist providing supervision to improve safety with community distances     Time  4    Period  Weeks    Status  New      PT SHORT TERM GOAL #7   Title  Pt will participate in 6 min walk test to assess baseline endurance with functional mobility     Time  4    Period  Weeks    Status  Achieved        PT  Long Term Goals - 07/28/18 1307      PT LONG TERM GOAL #1   Title  Pt will be independent with final HEP in order to indicate improved functional mobilty and decreased fall risk.   (LTG DUE 12/13)    Time  8    Period  Weeks    Status  New      PT LONG TERM GOAL #2   Title  Pt will improve gait speed to >/=3.17 ft/sec w/ LRAD in order to indicate decreased fall risk and safe community ambulator.     Period  Weeks    Status  New      PT LONG TERM GOAL #3   Title  Pt will perform 5TSS in </=14 secs without UE support in order to indicate decreased fall risk and improved functional strength.     Time  8    Period  Weeks    Status  New      PT LONG TERM GOAL #4   Title  Pt will improve BERG balance score to >/=38/56 in order to indicate decreased fall risk.      Time  8    Period  Weeks    Status  New      PT LONG TERM GOAL #5   Title  Pt will negotiate 8 steps with single HR and LRAD utilizing reciprocal stepping technique at Mod I level to increase safety with community accessibility.  Time  8    Period  Weeks    Status  New      PT LONG TERM GOAL #6   Title  Pt will ambulate 500 feet outdoors with LRAD navigating uneven terrain, curbs, ramps, and demonstrating ability to vary gait speed at Mod I level to improve functional mobility.     Time  8    Period  Weeks    Status  New      PT LONG TERM GOAL #7   Title  Pt will improve 6 min walk test by 150' indicating improvement with functional endurance    10/25: Patient performs 675 with rollator    Time  8    Period  Weeks    Status  Revised            Plan - 07/28/18 1301    Clinical Impression Statement  Pt did not bring NMES from home, will set up next visit.  Session focused on review of stretches and proximal hip strengthening exercises (without Bioness) to add to HEP with focus on glute and hip flexor activation.  Pt unable to activate glute med in sidelying without compensatory use of TFL; transitioned to  isometric ABD in standing beside a wall with improved activation.  Pt tolerated well and was set up for TDN next week of R TFL.  Will continue to address and progress towards LTG.    Rehab Potential  Good    Clinical Impairments Affecting Rehab Potential  Pt is motivated to improve. Strength gains may be limited 2/2 to nerve regeneration process     PT Frequency  2x / week    PT Duration  8 weeks    PT Treatment/Interventions  ADLs/Self Care Home Management;Aquatic Therapy;DME Instruction;Gait training;Stair training;Functional mobility training;Therapeutic activities;Therapeutic exercise;Balance training;Neuromuscular re-education;Patient/family education;Electrical Stimulation;Passive range of motion;Energy conservation    PT Next Visit Plan  CHECK STG by 11/13.  Set up her NMES unit and give exercises for home.  TDN of R TFL mm.  Activation of glute med.  Bioness R quad during gait and exercises.    PT Home Exercise Plan   [email or web address]     Consulted and Agree with Plan of Care  Patient       Patient will benefit from skilled therapeutic intervention in order to improve the following deficits and impairments:  Abnormal gait, Decreased activity tolerance, Decreased balance, Decreased coordination, Decreased endurance, Decreased mobility, Decreased range of motion, Decreased strength, Impaired perceived functional ability, Impaired flexibility, Impaired sensation, Pain, Decreased knowledge of use of DME, Decreased knowledge of precautions, Obesity  Visit Diagnosis: Unsteadiness on feet  Muscle weakness (generalized)  Chronic midline low back pain without sciatica     Problem List Patient Active Problem List   Diagnosis Date Noted  . Lumbosacral radiculopathy at L4 06/09/2018  . Generalized anxiety disorder   . Pain   . Fall   . Labile blood pressure   . Neuropathic pain   . Therapeutic opioid induced constipation   . Urinary retention   . Lumbar radiculopathy 02/21/2018  . PCOS  (polycystic ovarian syndrome)   . Morbid obesity ([facility])   . Depression   . Generalized OA   . Postoperative pain   . Acute blood loss anemia   . Status post surgery 02/17/2018  . Lumbar radiculopathy, chronic 02/17/2018  . Primary localized osteoarthritis of right hip 04/21/2016  . Sinusitis 06/25/2015  . Cough 06/25/2015  . Essential hypertension 04/03/2015  . Dehydration 04/03/2015  .  CAP (community acquired pneumonia) 03/31/2015  . Right lower lobe pneumonia ([facility]) 03/31/2015  . Fever 03/31/2015  . Pregnancy 04/29/2014  . Miscarriage   . Incomplete miscarriage 04/02/2011    Class: Acute  . 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 [place], [place], [address] Phone: [phone]   Fax:  [phone]  Name: Ashley [name] MRN: [id number] Date of Birth: Jul 27, 1976

## 2018-07-31 ENCOUNTER — Other Ambulatory Visit: Payer: Self-pay | Admitting: *Deleted

## 2018-07-31 ENCOUNTER — Ambulatory Visit: Payer: BLUE CROSS/BLUE SHIELD | Admitting: Rehabilitation

## 2018-07-31 MED ORDER — BACLOFEN 10 MG PO TABS: 30.0000 mg | ORAL_TABLET | Freq: Three times a day (TID) | ORAL | 1 refills | Status: DC

## 2018-08-02 ENCOUNTER — Ambulatory Visit: Payer: BLUE CROSS/BLUE SHIELD | Admitting: Physical Therapy

## 2018-08-02 ENCOUNTER — Encounter: Payer: Self-pay | Admitting: Physical Therapy

## 2018-08-02 DIAGNOSIS — M6281 Muscle weakness (generalized): Secondary | ICD-10-CM

## 2018-08-02 DIAGNOSIS — M545 Low back pain, unspecified: Secondary | ICD-10-CM

## 2018-08-02 DIAGNOSIS — R2681 Unsteadiness on feet: Secondary | ICD-10-CM

## 2018-08-02 DIAGNOSIS — G8929 Other chronic pain: Secondary | ICD-10-CM

## 2018-08-02 NOTE — Therapy (Signed)
[place] [address] [place], [place], [address] Phone: [phone]   Fax:  [phone]  Physical Therapy Treatment  Patient Details  Name: Ashley [name] MRN: [id number] Date of Birth: 07/28/1976 Referring Provider (PT): Dr. Kristeen Miss    Encounter Date: 08/02/2018  PT End of Session - 08/02/18 1638    Visit Number  8    Number of Visits  17    Date for PT Re-Evaluation  09/01/18    Authorization Type  [id number] 22 visits remaining    Authorization - Visit Number  8    Authorization - Number of Visits  22    PT Start Time  4742    PT Stop Time  1614    PT Time Calculation (min)  41 min    Activity Tolerance  Patient tolerated treatment well    Behavior During Therapy  [facility] for tasks assessed/performed       Past Medical History:  Diagnosis Date  . Arthritis    knees, back  . 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 Complication of anesthesia   . Depression   . History of pneumonia   . Hypertension   . Lumbar spondylosis   . PCOS (polycystic ovarian syndrome)   . Pneumonia    2016  . PONV (postoperative nausea and vomiting)     Past Surgical History:  Procedure Laterality Date  . ABDOMINAL EXPOSURE N/A 02/17/2018   Procedure: ABDOMINAL EXPOSURE;  Surgeon: Rosetta Posner, MD;  [facility];  Service: Vascular;  Laterality: N/A;  . ANKLE SURGERY     x 2  . ANTERIOR LAT LUMBAR FUSION N/A 02/17/2018   Procedure: Lumbar three-four Lumbar four-five  Anterolateral lumbar interbody fusion;  Surgeon: Kristeen Miss, MD;  Location: [place];  Service: Neurosurgery;  Laterality: N/A;  . ANTERIOR LUMBAR FUSION N/A 02/17/2018   Procedure: Lumbar five-Sacral one Anterior lumbar interbody fusion;  Surgeon: Kristeen Miss, MD;  Location: [place];  Service: Neurosurgery;  Laterality: N/A;  . APPLICATION OF ROBOTIC ASSISTANCE FOR SPINAL PROCEDURE N/A 02/17/2018   Procedure: APPLICATION OF ROBOTIC ASSISTANCE FOR  SPINAL PROCEDURE;  Surgeon: Kristeen Miss, MD;  Location: [place];  Service: Neurosurgery;  Laterality: N/A;  . APPLICATION OF ROBOTIC ASSISTANCE FOR SPINAL PROCEDURE N/A 06/09/2018   Procedure: APPLICATION OF ROBOTIC ASSISTANCE FOR SPINAL PROCEDURE;  Surgeon: Kristeen Miss, MD;  Location: [place];  Service: Neurosurgery;  Laterality: N/A;  . BACK SURGERY     2017  discectomy  . CARPAL TUNNEL RELEASE Left 07/04/2014   Procedure: LEFT CARPAL TUNNEL RELEASE;  Surgeon: Ninetta Lights, MD;  Location: [place];  Service: Orthopedics;  Laterality: Left;  . CARPAL TUNNEL RELEASE Right 08/08/2014   Procedure: RIGHT CARPAL TUNNEL RELEASE;  Surgeon: Ninetta Lights, MD;  Location: [place];  Service: Orthopedics;  Laterality: Right;  . DILATION AND CURETTAGE OF UTERUS    . DILATION AND EVACUATION  04/02/2011   Procedure: DILATATION AND EVACUATION (D&E);  Surgeon: Luz Lex, MD;  Location: [facility];  Service: Gynecology;  Laterality: N/A;  dvt left mid thigh  . DORSAL COMPARTMENT RELEASE Left 07/04/2014   Procedure: LEFT DEQUERVAINS;  Surgeon: Ninetta Lights, MD;  Location: [place];  Service: Orthopedics;  Laterality: Left;  . ENDOSCOPIC PLANTAR FASCIOTOMY Left 02/01/2002  . HARDWARE REMOVAL Right 06/09/2018   Procedure: Repositioning of Right Lumbar three Right lumbar  four pedicle screw with MAZOR;  Surgeon: Kristeen Miss, MD;  Location: [place];  Service: Neurosurgery;  Laterality: Right;  . HYSTEROSCOPY W/D&C  07/17/2010   with exc. endometrial polyps  . KNEE ARTHROSCOPY Right 03/07/2003; 01/14/2005; 08/31/2006  . KNEE ARTHROSCOPY Left 03/21/2003; 11/26/2004  . LUMBAR PERCUTANEOUS PEDICLE SCREW 3 LEVEL N/A 02/17/2018   Procedure: LUMBAR PERCUTANEOUS PEDICLE SCREW PLACEMENT LUMBAR THREE-SACRAL ONE;  Surgeon: Kristeen Miss, MD;  Location: [place];  Service: Neurosurgery;  Laterality: N/A;  . SHOULDER ARTHROSCOPY Right   . TOE SURGERY Left 01/10/2003   claw toe  correction 2nd, 3rd, 4th toes  . TOTAL HIP ARTHROPLASTY Right 04/21/2016  . TOTAL HIP ARTHROPLASTY Right 04/21/2016   Procedure: TOTAL HIP ARTHROPLASTY ANTERIOR APPROACH;  Surgeon: Ninetta Lights, MD;  Location: [place];  Service: Orthopedics;  Laterality: Right;  . TOTAL KNEE ARTHROPLASTY Left 01/21/2010  . TOTAL KNEE ARTHROPLASTY Right 08/26/2008    There were no vitals filed for this visit.  Subjective Assessment - 08/02/18 1637    Subjective  doing well - has been wlaking with son with rollator    Pertinent History  Past Surgical Hx: B TKA (2009, 2011) and R THA 2017. PMH includes; OA, Morbid Obesity, Depression, HTN, Lumbar Spondylosis, Polycystic Ovarian Syndrome, Pneumonia, and PONV    Limitations  Standing;Walking;Lifting;Sitting    How long can you sit comfortably?  Sitting is uncomfortable. No amount of time specified.     How long can you stand comfortably?  about 5-10 mins    How long can you walk comfortably?  Short period of time with no time period specified    Patient Stated Goals  Reports goal of being more mobile with increased flexibility. Wants to "atleast put lotion on her feet."    Currently in Pain?  No/denies    Pain Onset  --                       [facility] Adult PT Treatment/Exercise - 08/02/18 0001      Exercises   Exercises  Knee/Hip    Other Exercises   B isometric hip flexion with feet resting on green pball 5 sec x 10       Knee/Hip Exercises: Standing   Wall Squat  3 sets   1 set 5 reps regular; 2/3 set 10 reps with green pball      Knee/Hip Exercises: Supine   Other Supine Knee/Hip Exercises  HS curl with green pball x 12 reps      Manual Therapy   Manual Therapy  Soft tissue mobilization;Myofascial release    Manual therapy comments  skillful palpation thorughout TDN treatment    Soft tissue mobilization  STM ro R  quad mm group; tenderness over R hip incision from priot THA - likely tightness from scar tissue - educating on self massage     Myofascial Release  manual trigger point release at R rectus femoris       Trigger Point Dry Needling - 08/02/18 1637    Consent Given?  Yes    Education Handout Provided  Yes    Muscles Treated Lower Body  Quadriceps   R rectus and VL   Quadriceps Response  Twitch response elicited;Palpable increased muscle length             PT Short Term Goals - 07/28/18 1306      PT SHORT TERM GOAL #1   Title  Pt will initiate HEP in order to indicate improved functional mobility and  decreased fall risk.  (STG DUE 11/13)    Time  4    Period  Weeks    Status  New      PT SHORT TERM GOAL #2   Title  Pt will improve gait speed to >/= 2.57 ft/sec with LRAD in order to indicate decreased fall risk     Baseline  10/14: 2.27 ft/ sec with no AD    Time  4    Period  Weeks    Status  New      PT SHORT TERM GOAL #3   Title  Pt will perform 5TSS </=17.57 secs with single UE support only in order to indicate decreased fall risk and improved functional strength.      Baseline  10/14: 22.74 from standard height chair with B UE assist     Time  4    Period  Weeks    Status  New      PT SHORT TERM GOAL #4   Title  Pt will improve BERG balance score to >/=34/56 in order to indicate decreased fall risk.     Baseline  10/14: 30/56 indicative of high fall risk potential     Time  4    Period  Weeks    Status  New      PT SHORT TERM GOAL #5   Title  Pt will negotiate up/down 8 steps with bilateral rails with supervision in order to indicate improved LE strength    Time  4    Period  Weeks    Status  New      PT SHORT TERM GOAL #6   Title  Pt will ambulate 300' on uneven surfaces, ramps, curbs, inclines, demonstrating ability to vary gait speed when cued using LRAD and therapist providing supervision to improve safety with community distances     Time  4    Period  Weeks    Status  New      PT SHORT TERM GOAL #7   Title  Pt will participate in 6 min walk test to assess baseline  endurance with functional mobility     Time  4    Period  Weeks    Status  Achieved        PT Long Term Goals - 07/28/18 1307      PT LONG TERM GOAL #1   Title  Pt will be independent with final HEP in order to indicate improved functional mobilty and decreased fall risk.   (LTG DUE 12/13)    Time  8    Period  Weeks    Status  New      PT LONG TERM GOAL #2   Title  Pt will improve gait speed to >/=3.17 ft/sec w/ LRAD in order to indicate decreased fall risk and safe community ambulator.     Period  Weeks    Status  New      PT LONG TERM GOAL #3   Title  Pt will perform 5TSS in </=14 secs without UE support in order to indicate decreased fall risk and improved functional strength.     Time  8    Period  Weeks    Status  New      PT LONG TERM GOAL #4   Title  Pt will improve BERG balance score to >/=38/56 in order to indicate decreased fall risk.      Time  8    Period  Weeks  Status  New      PT LONG TERM GOAL #5   Title  Pt will negotiate 8 steps with single HR and LRAD utilizing reciprocal stepping technique at Mod I level to increase safety with community accessibility.     Time  8    Period  Weeks    Status  New      PT LONG TERM GOAL #6   Title  Pt will ambulate 500 feet outdoors with LRAD navigating uneven terrain, curbs, ramps, and demonstrating ability to vary gait speed at Mod I level to improve functional mobility.     Time  8    Period  Weeks    Status  New      PT LONG TERM GOAL #7   Title  Pt will improve 6 min walk test by 150' indicating improvement with functional endurance    10/25: Patient performs 675 with rollator    Time  8    Period  Weeks    Status  Revised            Plan - 08/02/18 1638    Clinical Impression Statement  Patient doing well - reports some soreness at R thigh. Noted tenderness with deep palpation over prior THA incision - likely due to scar tissue rather than trigger points - educated on self massage to this area  with good understanding. TDN to R rectus with good twitch and improved tissue tension. Patinet reporting improved symptoms upon standing. ABle to progress wall squats with physioball to good depth and with patient able to feel "quiver" with exercise demonstrating good recruitment. Patient tolerable to all strengthening and manual without issue. Will continue to progress towards goals.     Rehab Potential  Good    Clinical Impairments Affecting Rehab Potential  Pt is motivated to improve. Strength gains may be limited 2/2 to nerve regeneration process     PT Frequency  2x / week    PT Duration  8 weeks    PT Treatment/Interventions  ADLs/Self Care Home Management;Aquatic Therapy;DME Instruction;Gait training;Stair training;Functional mobility training;Therapeutic activities;Therapeutic exercise;Balance training;Neuromuscular re-education;Patient/family education;Electrical Stimulation;Passive range of motion;Energy conservation    PT Next Visit Plan  CHECK STG. Set up her NMES unit and give exercises for home. Activation of glute med.  Bioness R quad during gait and exercises.    PT Home Exercise Plan   [id number]     Consulted and Agree with Plan of Care  Patient       Patient will benefit from skilled therapeutic intervention in order to improve the following deficits and impairments:  Abnormal gait, Decreased activity tolerance, Decreased balance, Decreased coordination, Decreased endurance, Decreased mobility, Decreased range of motion, Decreased strength, Impaired perceived functional ability, Impaired flexibility, Impaired sensation, Pain, Decreased knowledge of use of DME, Decreased knowledge of precautions, Obesity  Visit Diagnosis: Unsteadiness on feet  Muscle weakness (generalized)  Chronic midline low back pain without sciatica     Problem List Patient Active Problem List   Diagnosis Date Noted  . Lumbosacral radiculopathy at L4 06/09/2018  . Generalized anxiety disorder   . Pain    . Fall   . Labile blood pressure   . Neuropathic pain   . Therapeutic opioid induced constipation   . Urinary retention   . Lumbar radiculopathy 02/21/2018  . PCOS (polycystic ovarian syndrome)   . Morbid obesity ([place])   . Depression   . Generalized OA   . Postoperative pain   . Acute blood  loss anemia   . Status post surgery 02/17/2018  . Lumbar radiculopathy, chronic 02/17/2018  . Primary localized osteoarthritis of right hip 04/21/2016  . Sinusitis 06/25/2015  . Cough 06/25/2015  . Essential hypertension 04/03/2015  . Dehydration 04/03/2015  . CAP (community acquired pneumonia) 03/31/2015  . Right lower lobe pneumonia ([place]) 03/31/2015  . Fever 03/31/2015  . Pregnancy 04/29/2014  . Miscarriage   . Incomplete miscarriage 04/02/2011    Class: Acute  . Deep vein thrombosis (DVT) ([place]) 04/02/2011    Class: Chronic    Lanney Gins, PT, DPT Supplemental Physical Therapist 08/02/18 4:42 PM Pager: [phone] Office: [place] [address] [place], [place], [address] Phone: [phone]   Fax:  [phone]  Name: Ashley [name] MRN: [id number] Date of Birth: [birth year]

## 2018-08-03 ENCOUNTER — Ambulatory Visit: Payer: BLUE CROSS/BLUE SHIELD | Admitting: Rehabilitation

## 2018-08-07 ENCOUNTER — Ambulatory Visit: Payer: BLUE CROSS/BLUE SHIELD | Admitting: Rehabilitation

## 2018-08-07 ENCOUNTER — Encounter: Payer: Self-pay | Admitting: Rehabilitation

## 2018-08-07 DIAGNOSIS — G8929 Other chronic pain: Secondary | ICD-10-CM

## 2018-08-07 DIAGNOSIS — M6281 Muscle weakness (generalized): Secondary | ICD-10-CM

## 2018-08-07 DIAGNOSIS — M545 Low back pain: Secondary | ICD-10-CM

## 2018-08-07 DIAGNOSIS — R2681 Unsteadiness on feet: Secondary | ICD-10-CM

## 2018-08-07 NOTE — Therapy (Signed)
[facility] [address] [place], [place], [address] Phone: [phone]   Fax:  [phone]  Physical Therapy Treatment  Patient Details  Name: Ashley [name] MRN: [id number] Date of Birth: [birth year] Referring Provider (PT): Dr. Kristeen Miss    Encounter Date: 08/07/2018  PT End of Session - 08/07/18 0940    Visit Number  9    Number of Visits  17    Date for PT Re-Evaluation  09/01/18    Authorization Type  [employer]-Has 22 visits remaining    Authorization - Visit Number  9    Authorization - Number of Visits  22    PT Start Time  0935    PT Stop Time  1020    PT Time Calculation (min)  45 min    Activity Tolerance  Patient tolerated treatment well    Behavior During Therapy  [facility] for tasks assessed/performed       Past Medical History:  Diagnosis Date  . Arthritis    knees, back  . 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 Complication of anesthesia   . Depression   . History of pneumonia   . Hypertension   . Lumbar spondylosis   . PCOS (polycystic ovarian syndrome)   . Pneumonia    2016  . PONV (postoperative nausea and vomiting)     Past Surgical History:  Procedure Laterality Date  . ABDOMINAL EXPOSURE N/A 02/17/2018   Procedure: ABDOMINAL EXPOSURE;  Surgeon: Rosetta Posner, MD;  Location: [facility];  Service: Vascular;  Laterality: N/A;  . ANKLE SURGERY     x 2  . ANTERIOR LAT LUMBAR FUSION N/A 02/17/2018   Procedure: Lumbar three-four Lumbar four-five  Anterolateral lumbar interbody fusion;  Surgeon: Kristeen Miss, MD;  Location: [facility];  Service: Neurosurgery;  Laterality: N/A;  . ANTERIOR LUMBAR FUSION N/A 02/17/2018   Procedure: Lumbar five-Sacral one Anterior lumbar interbody fusion;  Surgeon: Kristeen Miss, MD;  Location: [facility];  Service: Neurosurgery;  Laterality: N/A;  . APPLICATION OF ROBOTIC ASSISTANCE FOR SPINAL PROCEDURE N/A 02/17/2018   Procedure: APPLICATION OF ROBOTIC ASSISTANCE FOR  SPINAL PROCEDURE;  Surgeon: Kristeen Miss, MD;  Location: [place];  Service: Neurosurgery;  Laterality: N/A;  . APPLICATION OF ROBOTIC ASSISTANCE FOR SPINAL PROCEDURE N/A 06/09/2018   Procedure: APPLICATION OF ROBOTIC ASSISTANCE FOR SPINAL PROCEDURE;  Surgeon: Kristeen Miss, MD;  Location: [facility];  Service: Neurosurgery;  Laterality: N/A;  . BACK SURGERY     2017  discectomy  . CARPAL TUNNEL RELEASE Left 07/04/2014   Procedure: LEFT CARPAL TUNNEL RELEASE;  Surgeon: Ninetta Lights, MD;  Location: [place];  Service: Orthopedics;  Laterality: Left;  . CARPAL TUNNEL RELEASE Right 08/08/2014   Procedure: RIGHT CARPAL TUNNEL RELEASE;  Surgeon: Ninetta Lights, MD;  Location: [place];  Service: Orthopedics;  Laterality: Right;  . DILATION AND CURETTAGE OF UTERUS    . DILATION AND EVACUATION  04/02/2011   Procedure: DILATATION AND EVACUATION (D&E);  Surgeon: Luz Lex, MD;  Location: [facility];  Service: Gynecology;  Laterality: N/A;  dvt left mid thigh  . DORSAL COMPARTMENT RELEASE Left 07/04/2014   Procedure: LEFT DEQUERVAINS;  Surgeon: Ninetta Lights, MD;  Location: [facility];  Service: Orthopedics;  Laterality: Left;  . ENDOSCOPIC PLANTAR FASCIOTOMY Left 02/01/2002  . HARDWARE REMOVAL Right 06/09/2018   Procedure: Repositioning of Right Lumbar three Right lumbar  four pedicle screw with MAZOR;  Surgeon: Kristeen Miss, MD;  Location: [place];  Service: Neurosurgery;  Laterality: Right;  . HYSTEROSCOPY W/D&C  07/17/2010   with exc. endometrial polyps  . KNEE ARTHROSCOPY Right 03/07/2003; 01/14/2005; 08/31/2006  . KNEE ARTHROSCOPY Left 03/21/2003; 11/26/2004  . LUMBAR PERCUTANEOUS PEDICLE SCREW 3 LEVEL N/A 02/17/2018   Procedure: LUMBAR PERCUTANEOUS PEDICLE SCREW PLACEMENT LUMBAR THREE-SACRAL ONE;  Surgeon: Kristeen Miss, MD;  Location: [facility];  Service: Neurosurgery;  Laterality: N/A;  . SHOULDER ARTHROSCOPY Right   . TOE SURGERY Left 01/10/2003   claw toe  correction 2nd, 3rd, 4th toes  . TOTAL HIP ARTHROPLASTY Right 04/21/2016  . TOTAL HIP ARTHROPLASTY Right 04/21/2016   Procedure: TOTAL HIP ARTHROPLASTY ANTERIOR APPROACH;  Surgeon: Ninetta Lights, MD;  Location: [place] [facility];  Service: Orthopedics;  Laterality: Right;  . TOTAL KNEE ARTHROPLASTY Left 01/21/2010  . TOTAL KNEE ARTHROPLASTY Right 08/26/2008    There were no vitals filed for this visit.  Subjective Assessment - 08/07/18 0939    Subjective  Pt reports she has been doing scar massage at home.  Was busy with daughter over the weekend.     Pertinent History  Past Surgical Hx: B TKA (2009, 2011) and R THA 2017. PMH includes; OA, Morbid Obesity, Depression, HTN, Lumbar Spondylosis, Polycystic Ovarian Syndrome, Pneumonia, and PONV    Limitations  Standing;Walking;Lifting;Sitting    How long can you sit comfortably?  Sitting is uncomfortable. No amount of time specified.     How long can you stand comfortably?  about 5-10 mins    How long can you walk comfortably?  Short period of time with no time period specified    Patient Stated Goals  Reports goal of being more mobile with increased flexibility. Wants to "atleast put lotion on her feet."    Currently in Pain?  Yes    Pain Score  3     Pain Location  Back    Pain Orientation  Lower    Pain Descriptors / Indicators  Aching;Discomfort    Pain Type  Chronic pain    Pain Onset  More than a month ago    Pain Frequency  Constant    Aggravating Factors   activity    Pain Relieving Factors  heating pad                       [facility] Adult PT Treatment/Exercise - 08/07/18 0959      Transfers   Transfers  Sit to Stand;Stand to Sit    Sit to Stand  6: Modified independent (Device/Increase time)    Five time sit to stand comments   16.38 secs without UE support    Stand to Sit  6: Modified independent (Device/Increase time)      Ambulation/Gait   Ambulation/Gait  Yes    Ambulation/Gait Assistance  5: Supervision;6: Modified  independent (Device/Increase time)    Ambulation/Gait Assistance Details  Pt able to ambulate outdoors without AD at S to mod I level.  Pt ambulates over varying surfaces, up/down curb, inclines/declines and ability to vary speed well within session.      Ambulation Distance (Feet)  400 Feet    Assistive device  None    Gait Pattern  Step-through pattern;Decreased hip/knee flexion - right;Decreased arm swing - right;Decreased arm swing - left;Decreased stride length;Decreased hip/knee flexion - left;Decreased dorsiflexion - right;Decreased dorsiflexion - left;Wide base of support;Decreased trunk rotation;Decreased weight shift to right    Ambulation Surface  Level;Indoor  Gait velocity  3.16 ft/sec without AD    Stairs  Yes    Stairs Assistance  6: Modified independent (Device/Increase time)    Stair Management Technique  One rail Right;Alternating pattern;Forwards    Number of Stairs  8    Height of Stairs  6      Standardized Balance Assessment   Standardized Balance Assessment  Berg Balance Test      Berg Balance Test   Sit to Stand  Able to stand without using hands and stabilize independently    Standing Unsupported  Able to stand safely 2 minutes    Sitting with Back Unsupported but Feet Supported on Floor or Stool  Able to sit safely and securely 2 minutes    Stand to Sit  Sits safely with minimal use of hands    Transfers  Able to transfer safely, minor use of hands    Standing Unsupported with Eyes Closed  Able to stand 10 seconds safely    Standing Ubsupported with Feet Together  Able to place feet together independently and stand 1 minute safely    From Standing, Reach Forward with Outstretched Arm  Can reach confidently >25 cm (10")    From Standing Position, Pick up Object from Floor  Able to pick up shoe safely and easily    From Standing Position, Turn to Look Behind Over each Shoulder  Looks behind from both sides and weight shifts well    Turn 360 Degrees  Able to turn  360 degrees safely but slowly    Standing Unsupported, Alternately Place Feet on Step/Stool  Able to stand independently and safely and complete 8 steps in 20 seconds    Standing Unsupported, One Foot in Front  Able to plae foot ahead of the other independently and hold 30 seconds    Standing on One Leg  Able to lift leg independently and hold equal to or more than 3 seconds    Total Score  51      Exercises   Other Exercises   R knee to chest x 30 secs, R knee to L shoulder for piriformis stretch x 45 secs.  Added to HEP, see pt instruction.             PT Education - 08/07/18 1331    Education Details  Progress towards LTGs    Person(s) Educated  Patient    Methods  Explanation    Comprehension  Verbalized understanding       PT Short Term Goals - 08/07/18 0949      PT SHORT TERM GOAL #1   Title  Pt will initiate HEP in order to indicate improved functional mobility and decreased fall risk.  (STG DUE 11/13)    Baseline  met 08/07/18    Time  4    Period  Weeks    Status  Achieved      PT SHORT TERM GOAL #2   Title  Pt will improve gait speed to >/= 2.57 ft/sec with LRAD in order to indicate decreased fall risk     Baseline  10/14: 2.27 ft/ sec with no AD to 3.16 ft/sec without AD on 08/07/18    Time  4    Period  Weeks    Status  Achieved      PT SHORT TERM GOAL #3   Title  Pt will perform 5TSS </=17.57 secs with single UE support only in order to indicate decreased fall risk and improved functional strength.  Baseline  10/14: 22.74 from standard height chair with B UE assist to 16.38 secs without UE support.     Time  4    Period  Weeks    Status  Achieved      PT SHORT TERM GOAL #4   Title  Pt will improve BERG balance score to >/=34/56 in order to indicate decreased fall risk.     Baseline  10/14: 30/56 indicative of high fall risk potential to 51/56 on 08/07/18    Time  4    Period  Weeks    Status  Achieved      PT SHORT TERM GOAL #5   Title  Pt will  negotiate up/down 8 steps with bilateral rails with supervision in order to indicate improved LE strength    Baseline  met with single rail reciprocal pattern 08/07/18    Time  4    Period  Weeks    Status  Achieved      PT SHORT TERM GOAL #6   Title  Pt will ambulate 300' on uneven surfaces, ramps, curbs, inclines, demonstrating ability to vary gait speed when cued using LRAD and therapist providing supervision to improve safety with community distances    met 08/07/18   Time  4    Period  Weeks    Status  Achieved      PT SHORT TERM GOAL #7   Title  Pt will participate in 6 min walk test to assess baseline endurance with functional mobility     Time  4    Period  Weeks    Status  New        PT Long Term Goals - 08/07/18 1337      PT LONG TERM GOAL #1   Title  Pt will be independent with final HEP in order to indicate improved functional mobilty and decreased fall risk.   (LTG DUE 12/13)    Time  8    Period  Weeks    Status  New      PT LONG TERM GOAL #2   Title  Pt will improve gait speed to >/=3.67 ft/sec w/ LRAD in order to indicate decreased fall risk and safe community ambulator.     Baseline  3.16 ft/sec on 08/07/18, updated due to being only .01 sec off from goal    Period  Weeks    Status  Revised      PT LONG TERM GOAL #3   Title  Pt will perform 5TSS in </=13 secs without UE support in order to indicate decreased fall risk and improved functional strength.     Baseline  16.38 secs without UE support     Time  8    Period  Weeks    Status  Revised      PT LONG TERM GOAL #4   Title  Pt will improve BERG balance score to >/=38/56 in order to indicate decreased fall risk.      Baseline  met 51/56 08/07/18    Time  8    Period  Weeks    Status  Achieved      PT LONG TERM GOAL #5   Title  Pt will negotiate 8 steps with single HR and LRAD utilizing reciprocal stepping technique at Mod I level to increase safety with community accessibility.     Baseline  met  08/07/18    Time  8    Period  Weeks    Status  Achieved      PT LONG TERM GOAL #6   Title  Pt will ambulate 500 feet outdoors with LRAD navigating uneven terrain, curbs, ramps, and demonstrating ability to vary gait speed at Mod I level to improve functional mobility.     Time  8    Period  Weeks    Status  New      PT LONG TERM GOAL #7   Title  Pt will improve 6 min walk test by 150' indicating improvement with functional endurance    10/25: Patient performs 675 with rollator    Time  8    Period  Weeks    Status  Revised            Plan - 08/07/18 1332    Clinical Impression Statement  Skilled session focused on assessing STGs. Pt has met 6/7 STGs and also met several LTGs, therefore they were updated as needed.  Pt making excellent progress with all aspects of mobility.     Rehab Potential  Good    Clinical Impairments Affecting Rehab Potential  Pt is motivated to improve. Strength gains may be limited 2/2 to nerve regeneration process     PT Frequency  2x / week    PT Duration  8 weeks    PT Treatment/Interventions  ADLs/Self Care Home Management;Aquatic Therapy;DME Instruction;Gait training;Stair training;Functional mobility training;Therapeutic activities;Therapeutic exercise;Balance training;Neuromuscular re-education;Patient/family education;Electrical Stimulation;Passive range of motion;Energy conservation    PT Next Visit Plan  Do 6 minute walk test.  Do FGA-add LTG, Set up her NMES unit and give exercises for home. Activation of glute med.  Bioness R quad during gait and exercises.    PT Home Exercise Plan   [email or web address]     Consulted and Agree with Plan of Care  Patient       Patient will benefit from skilled therapeutic intervention in order to improve the following deficits and impairments:  Abnormal gait, Decreased activity tolerance, Decreased balance, Decreased coordination, Decreased endurance, Decreased mobility, Decreased range of motion, Decreased strength,  Impaired perceived functional ability, Impaired flexibility, Impaired sensation, Pain, Decreased knowledge of use of DME, Decreased knowledge of precautions, Obesity  Visit Diagnosis: Unsteadiness on feet  Muscle weakness (generalized)  Chronic midline low back pain without sciatica     Problem List Patient Active Problem List   Diagnosis Date Noted  . Lumbosacral radiculopathy at L4 06/09/2018  . Generalized anxiety disorder   . Pain   . Fall   . Labile blood pressure   . Neuropathic pain   . Therapeutic opioid induced constipation   . Urinary retention   . Lumbar radiculopathy 02/21/2018  . PCOS (polycystic ovarian syndrome)   . Morbid obesity ([place])   . Depression   . Generalized OA   . Postoperative pain   . Acute blood loss anemia   . Status post surgery 02/17/2018  . Lumbar radiculopathy, chronic 02/17/2018  . Primary localized osteoarthritis of right hip 04/21/2016  . Sinusitis 06/25/2015  . Cough 06/25/2015  . Essential hypertension 04/03/2015  . Dehydration 04/03/2015  . CAP (community acquired pneumonia) 03/31/2015  . Right lower lobe pneumonia ([place]) 03/31/2015  . Fever 03/31/2015  . Pregnancy 04/29/2014  . Miscarriage   . Incomplete miscarriage 04/02/2011    Class: Acute  . 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

## 2018-08-07 NOTE — Patient Instructions (Signed)
Access Code: [email or web address]  URL: [email or web address]  Date: 08/07/2018  Prepared by: Cameron Sprang   Exercises  Seated Scapular Retraction - 10 reps - 2 sets - 2-3 hold - 1x daily - 7x weekly  Hip Flexor Stretch at Edge of Bed - 3 sets - 60 hold - 1x daily - 7x weekly  Sidelying ITB Stretch off Table - 3 reps - 1 sets - 30-60 hold - 1x daily - 7x weekly  Sidestepping in Squat with Resistance and Arms Forward - 4 reps - 1 sets - 1x daily - 7x weekly  Upper Trapezius Stretch - 10 reps - 3 sets - 1x daily - 7x weekly  Beginner Bridge - 10 reps - 2 sets - 1x daily - 7x weekly  Single Leg Bridge - 5 reps - 2 sets - 1x daily - 7x weekly  Isometric Gluteus Medius at Wall - 10 reps - 2 sets - 1x daily - 7x weekly  Seated Hamstring Stretch with Strap - 3 reps - 30 seconds hold - 2x daily - 7x weekly  Supine Piriformis Stretch with Leg Straight - 3 reps - 1 sets - 45 hold - 1x daily - 7x weekly

## 2018-08-10 ENCOUNTER — Encounter: Payer: Self-pay | Admitting: Rehabilitation

## 2018-08-10 ENCOUNTER — Ambulatory Visit: Payer: BLUE CROSS/BLUE SHIELD | Admitting: Rehabilitation

## 2018-08-10 DIAGNOSIS — G8929 Other chronic pain: Secondary | ICD-10-CM

## 2018-08-10 DIAGNOSIS — M545 Low back pain, unspecified: Secondary | ICD-10-CM

## 2018-08-10 DIAGNOSIS — R2681 Unsteadiness on feet: Secondary | ICD-10-CM

## 2018-08-10 DIAGNOSIS — M6281 Muscle weakness (generalized): Secondary | ICD-10-CM

## 2018-08-10 NOTE — Therapy (Signed)
[facility] [address] [place], [place], [address] Phone: [phone]   Fax:  [phone]  Physical Therapy Treatment  Patient Details  Name: Ashley [name] MRN: [id number] Date of Birth: [birth year] Referring Provider (PT): Dr. Kristeen Miss    Encounter Date: 08/10/2018  PT End of Session - 08/10/18 1228    Visit Number  10    Number of Visits  17    Date for PT Re-Evaluation  09/01/18    Authorization Type  [id number] 22 visits remaining    Authorization - Visit Number  10    Authorization - Number of Visits  22    PT Start Time  0933    PT Stop Time  1015    PT Time Calculation (min)  42 min    Activity Tolerance  Patient tolerated treatment well    Behavior During Therapy  [facility] for tasks assessed/performed       Past Medical History:  Diagnosis Date  . Arthritis    knees, back  . 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 Complication of anesthesia   . Depression   . History of pneumonia   . Hypertension   . Lumbar spondylosis   . PCOS (polycystic ovarian syndrome)   . Pneumonia    2016  . PONV (postoperative nausea and vomiting)     Past Surgical History:  Procedure Laterality Date  . ABDOMINAL EXPOSURE N/A 02/17/2018   Procedure: ABDOMINAL EXPOSURE;  Surgeon: Rosetta Posner, MD;  Location: [facility] OR;  Service: Vascular;  Laterality: N/A;  . ANKLE SURGERY     x 2  . ANTERIOR LAT LUMBAR FUSION N/A 02/17/2018   Procedure: Lumbar three-four Lumbar four-five  Anterolateral lumbar interbody fusion;  Surgeon: Kristeen Miss, MD;  Location: [facility];  Service: Neurosurgery;  Laterality: N/A;  . ANTERIOR LUMBAR FUSION N/A 02/17/2018   Procedure: Lumbar five-Sacral one Anterior lumbar interbody fusion;  Surgeon: Kristeen Miss, MD;  Location: [facility];  Service: Neurosurgery;  Laterality: N/A;  . APPLICATION OF ROBOTIC ASSISTANCE FOR SPINAL PROCEDURE N/A 02/17/2018   Procedure: APPLICATION OF ROBOTIC ASSISTANCE FOR  SPINAL PROCEDURE;  Surgeon: Kristeen Miss, MD;  Location: [facility];  Service: Neurosurgery;  Laterality: N/A;  . APPLICATION OF ROBOTIC ASSISTANCE FOR SPINAL PROCEDURE N/A 06/09/2018   Procedure: APPLICATION OF ROBOTIC ASSISTANCE FOR SPINAL PROCEDURE;  Surgeon: Kristeen Miss, MD;  Location: [facility];  Service: Neurosurgery;  Laterality: N/A;  . BACK SURGERY     2017  discectomy  . CARPAL TUNNEL RELEASE Left 07/04/2014   Procedure: LEFT CARPAL TUNNEL RELEASE;  Surgeon: Ninetta Lights, MD;  Location: [facility];  Service: Orthopedics;  Laterality: Left;  . CARPAL TUNNEL RELEASE Right 08/08/2014   Procedure: RIGHT CARPAL TUNNEL RELEASE;  Surgeon: Ninetta Lights, MD;  Location: [facility];  Service: Orthopedics;  Laterality: Right;  . DILATION AND CURETTAGE OF UTERUS    . DILATION AND EVACUATION  04/02/2011   Procedure: DILATATION AND EVACUATION (D&E);  Surgeon: Luz Lex, MD;  Location: [facility];  Service: Gynecology;  Laterality: N/A;  dvt left mid thigh  . DORSAL COMPARTMENT RELEASE Left 07/04/2014   Procedure: LEFT DEQUERVAINS;  Surgeon: Ninetta Lights, MD;  Location: [facility];  Service: Orthopedics;  Laterality: Left;  . ENDOSCOPIC PLANTAR FASCIOTOMY Left 02/01/2002  . HARDWARE REMOVAL Right 06/09/2018   Procedure: Repositioning of Right Lumbar three Right lumbar  four pedicle screw with MAZOR;  Surgeon: Kristeen Miss, MD;  Location: [facility];  Service: Neurosurgery;  Laterality: Right;  . HYSTEROSCOPY W/D&C  07/17/2010   with exc. endometrial polyps  . KNEE ARTHROSCOPY Right 03/07/2003; 01/14/2005; 08/31/2006  . KNEE ARTHROSCOPY Left 03/21/2003; 11/26/2004  . LUMBAR PERCUTANEOUS PEDICLE SCREW 3 LEVEL N/A 02/17/2018   Procedure: LUMBAR PERCUTANEOUS PEDICLE SCREW PLACEMENT LUMBAR THREE-SACRAL ONE;  Surgeon: Kristeen Miss, MD;  Location: [facility];  Service: Neurosurgery;  Laterality: N/A;  . SHOULDER ARTHROSCOPY Right   . TOE SURGERY Left 01/10/2003   claw toe  correction 2nd, 3rd, 4th toes  . TOTAL HIP ARTHROPLASTY Right 04/21/2016  . TOTAL HIP ARTHROPLASTY Right 04/21/2016   Procedure: TOTAL HIP ARTHROPLASTY ANTERIOR APPROACH;  Surgeon: Ninetta Lights, MD;  Location: [facility];  Service: Orthopedics;  Laterality: Right;  . TOTAL KNEE ARTHROPLASTY Left 01/21/2010  . TOTAL KNEE ARTHROPLASTY Right 08/26/2008    There were no vitals filed for this visit.  Subjective Assessment - 08/10/18 0935    Subjective  Pt reports increased pain in R hip.     Pertinent History  Past Surgical Hx: B TKA (2009, 2011) and R THA 2017. PMH includes; OA, Morbid Obesity, Depression, HTN, Lumbar Spondylosis, Polycystic Ovarian Syndrome, Pneumonia, and PONV    Limitations  Standing;Walking;Lifting;Sitting    How long can you sit comfortably?  Sitting is uncomfortable. No amount of time specified.     How long can you stand comfortably?  about 5-10 mins    How long can you walk comfortably?  Short period of time with no time period specified    Patient Stated Goals  Reports goal of being more mobile with increased flexibility. Wants to "atleast put lotion on her feet."    Currently in Pain?  Yes    Pain Score  3     Pain Location  Hip    Pain Orientation  Right    Pain Descriptors / Indicators  Aching    Pain Onset  More than a month ago    Pain Frequency  Constant    Aggravating Factors   lifting leg to get in car    Pain Relieving Factors  heating pad          [facility] PT Assessment - 08/10/18 0941      6 Minute Walk- Baseline   6 Minute Walk- Baseline  yes    BP (mmHg)  144/81    HR (bpm)  76    02 Sat (%RA)  98 %    Modified Borg Scale for Dyspnea  0- Nothing at all    Perceived Rate of Exertion (Borg)  6-      6 Minute walk- Post Test   6 Minute Walk Post Test  yes    BP (mmHg)  132/81    HR (bpm)  80    02 Sat (%RA)  98 %    Perceived Rate of Exertion (Borg)  9- very light      6 minute walk test results    Aerobic Endurance Distance Walked  818     Endurance additional comments  with rollator       Functional Gait  Assessment   Gait assessed   Yes    Gait Level Surface  Walks 20 ft in less than 7 sec but greater than 5.5 sec, uses assistive device, slower speed, mild gait deviations, or deviates 6-10 in outside of the 12 in walkway width.   6.69 secs    Change in Gait  Speed  Able to change speed, demonstrates mild gait deviations, deviates 6-10 in outside of the 12 in walkway width, or no gait deviations, unable to achieve a major change in velocity, or uses a change in velocity, or uses an assistive device.    Gait with Horizontal Head Turns  Performs head turns smoothly with no change in gait. Deviates no more than 6 in outside 12 in walkway width    Gait with Vertical Head Turns  Performs head turns with no change in gait. Deviates no more than 6 in outside 12 in walkway width.    Gait and Pivot Turn  Pivot turns safely in greater than 3 sec and stops with no loss of balance, or pivot turns safely within 3 sec and stops with mild imbalance, requires small steps to catch balance.    Step Over Obstacle  Is able to step over one shoe box (4.5 in total height) but must slow down and adjust steps to clear box safely. May require verbal cueing.    Gait with Narrow Base of Support  Ambulates 4-7 steps.    Gait with Eyes Closed  Walks 20 ft, slow speed, abnormal gait pattern, evidence for imbalance, deviates 10-15 in outside 12 in walkway width. Requires more than 9 sec to ambulate 20 ft.    Ambulating Backwards  Walks 20 ft, uses assistive device, slower speed, mild gait deviations, deviates 6-10 in outside 12 in walkway width.    Steps  Alternating feet, must use rail.    Total Score  19    FGA comment:  19-24 = medium risk fall                           PT Education - 08/10/18 1228    Education Details  Encouraged ice prior to scar massage to R hip    Person(s) Educated  Patient    Methods  Explanation    Comprehension   Verbalized understanding       PT Short Term Goals - 08/10/18 0956      PT SHORT TERM GOAL #1   Title  Pt will initiate HEP in order to indicate improved functional mobility and decreased fall risk.  (STG DUE 11/13)    Baseline  met 08/07/18    Time  4    Period  Weeks    Status  Achieved      PT SHORT TERM GOAL #2   Title  Pt will improve gait speed to >/= 2.57 ft/sec with LRAD in order to indicate decreased fall risk     Baseline  10/14: 2.27 ft/ sec with no AD to 3.16 ft/sec without AD on 08/07/18    Time  4    Period  Weeks    Status  Achieved      PT SHORT TERM GOAL #3   Title  Pt will perform 5TSS </=17.57 secs with single UE support only in order to indicate decreased fall risk and improved functional strength.      Baseline  10/14: 22.74 from standard height chair with B UE assist to 16.38 secs without UE support.     Time  4    Period  Weeks    Status  Achieved      PT SHORT TERM GOAL #4   Title  Pt will improve BERG balance score to >/=34/56 in order to indicate decreased fall risk.     Baseline  10/14: 30/56  indicative of high fall risk potential to 51/56 on 08/07/18    Time  4    Period  Weeks    Status  Achieved      PT SHORT TERM GOAL #5   Title  Pt will negotiate up/down 8 steps with bilateral rails with supervision in order to indicate improved LE strength    Baseline  met with single rail reciprocal pattern 08/07/18    Time  4    Period  Weeks    Status  Achieved      PT SHORT TERM GOAL #6   Title  Pt will ambulate 300' on uneven surfaces, ramps, curbs, inclines, demonstrating ability to vary gait speed when cued using LRAD and therapist providing supervision to improve safety with community distances    met 08/07/18   Time  4    Period  Weeks    Status  Achieved      PT SHORT TERM GOAL #7   Title  Pt will participate in 6 min walk test to assess baseline endurance with functional mobility     Time  4    Period  Weeks    Status  Achieved         PT Long Term Goals - 08/10/18 0957      PT LONG TERM GOAL #1   Title  Pt will be independent with final HEP in order to indicate improved functional mobilty and decreased fall risk.   (LTG DUE 12/13)    Time  8    Period  Weeks    Status  New      PT LONG TERM GOAL #2   Title  Pt will improve gait speed to >/=3.67 ft/sec w/ LRAD in order to indicate decreased fall risk and safe community ambulator.     Baseline  3.16 ft/sec on 08/07/18, updated due to being only .01 sec off from goal    Period  Weeks    Status  Revised      PT LONG TERM GOAL #3   Title  Pt will perform 5TSS in </=13 secs without UE support in order to indicate decreased fall risk and improved functional strength.     Baseline  16.38 secs without UE support     Time  8    Period  Weeks    Status  Revised      PT LONG TERM GOAL #4   Title  Pt will improve BERG balance score to >/=38/56 in order to indicate decreased fall risk.      Baseline  met 51/56 08/07/18    Time  8    Period  Weeks    Status  Achieved      PT LONG TERM GOAL #5   Title  Pt will negotiate 8 steps with single HR and LRAD utilizing reciprocal stepping technique at Mod I level to increase safety with community accessibility.     Baseline  met 08/07/18    Time  8    Period  Weeks    Status  Achieved      PT LONG TERM GOAL #6   Title  Pt will ambulate 500 feet outdoors with LRAD navigating uneven terrain, curbs, ramps, and demonstrating ability to vary gait speed at Mod I level to improve functional mobility.     Time  8    Period  Weeks    Status  New      PT LONG TERM GOAL #7  Title  Pt will improve 6 min walk test by 300' indicating improvement with functional endurance    10/25: Patient performs 675 with rollator to 818' 08/10/18   Time  8    Period  Weeks    Status  Revised            Plan - 08/10/18 1229    Clinical Impression Statement  Skilled session focused on assessment of 6MWT as remaining STG.  Note that she  has improved distance 143', just shy of LTG level, therefore will update goal to reflect 300' improvement from baseline.  Also performed FGA today for increased balance challenge.  NOte score of 19/30 indicative of medium fall risk.  Educated pt on results and encouraged her to push level of exertion to at least 13/20 on RPE in order to progress endurance.     Rehab Potential  Good    Clinical Impairments Affecting Rehab Potential  Pt is motivated to improve. Strength gains may be limited 2/2 to nerve regeneration process     PT Frequency  2x / week    PT Duration  8 weeks    PT Treatment/Interventions  ADLs/Self Care Home Management;Aquatic Therapy;DME Instruction;Gait training;Stair training;Functional mobility training;Therapeutic activities;Therapeutic exercise;Balance training;Neuromuscular re-education;Patient/family education;Electrical Stimulation;Passive range of motion;Energy conservation    PT Next Visit Plan  Continue B[place] to R quad during gait and exercises,  Set up her NMES unit if she brings to session and give exercises to do with this on at home.  Endurance, increasing gait speed (treadmill)    PT Home Exercise Plan   [id number]     Consulted and Agree with Plan of Care  Patient       Patient will benefit from skilled therapeutic intervention in order to improve the following deficits and impairments:  Abnormal gait, Decreased activity tolerance, Decreased balance, Decreased coordination, Decreased endurance, Decreased mobility, Decreased range of motion, Decreased strength, Impaired perceived functional ability, Impaired flexibility, Impaired sensation, Pain, Decreased knowledge of use of DME, Decreased knowledge of precautions, Obesity  Visit Diagnosis: Unsteadiness on feet  Muscle weakness (generalized)  Chronic midline low back pain without sciatica     Problem List Patient Active Problem List   Diagnosis Date Noted  . Lumbosacral radiculopathy at L4 06/09/2018  .  Generalized anxiety disorder   . Pain   . Fall   . Labile blood pressure   . Neuropathic pain   . Therapeutic opioid induced constipation   . Urinary retention   . Lumbar radiculopathy 02/21/2018  . PCOS (polycystic ovarian syndrome)   . Morbid obesity ([place])   . Depression   . Generalized OA   . Postoperative pain   . Acute blood loss anemia   . Status post surgery 02/17/2018  . Lumbar radiculopathy, chronic 02/17/2018  . Primary localized osteoarthritis of right hip 04/21/2016  . Sinusitis 06/25/2015  . Cough 06/25/2015  . Essential hypertension 04/03/2015  . Dehydration 04/03/2015  . CAP (community acquired pneumonia) 03/31/2015  . Right lower lobe pneumonia ([place]) 03/31/2015  . Fever 03/31/2015  . Pregnancy 04/29/2014  . Miscarriage   . Incomplete miscarriage 04/02/2011    Class: Acute  . 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 [place], [place], [address] Phone: [phone]   Fax:  [phone] 08/10/18, 12:33 PM  Name: Ashley [name] MRN: [id number] Date of Birth: 1976/08/08

## 2018-08-14 ENCOUNTER — Encounter: Payer: Self-pay | Admitting: Physical Therapy

## 2018-08-14 ENCOUNTER — Ambulatory Visit: Payer: BLUE CROSS/BLUE SHIELD | Admitting: Physical Therapy

## 2018-08-14 DIAGNOSIS — G8929 Other chronic pain: Secondary | ICD-10-CM

## 2018-08-14 DIAGNOSIS — M6281 Muscle weakness (generalized): Secondary | ICD-10-CM

## 2018-08-14 DIAGNOSIS — M545 Low back pain, unspecified: Secondary | ICD-10-CM

## 2018-08-14 DIAGNOSIS — R2681 Unsteadiness on feet: Secondary | ICD-10-CM

## 2018-08-14 NOTE — Therapy (Signed)
[facility] [address] [place], [place], [address] Phone: [phone]   Fax:  [phone]  Physical Therapy Treatment  Patient Details  Name: Ashley [name] MRN: [id number] Date of Birth: Feb 10, 1976 Referring Provider (PT): Dr. Kristeen Miss    Encounter Date: 08/14/2018  PT End of Session - 08/14/18 1619    Visit Number  11    Number of Visits  17    Date for PT Re-Evaluation  09/01/18    Authorization Type  [id number] 22 visits remaining    Authorization - Visit Number  11    Authorization - Number of Visits  22    PT Start Time  1319    PT Stop Time  1400    PT Time Calculation (min)  41 min    Activity Tolerance  Patient tolerated treatment well    Behavior During Therapy  [facility] for tasks assessed/performed       Past Medical History:  Diagnosis Date  . Arthritis    knees, back  . 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 Complication of anesthesia   . Depression   . History of pneumonia   . Hypertension   . Lumbar spondylosis   . PCOS (polycystic ovarian syndrome)   . Pneumonia    2016  . PONV (postoperative nausea and vomiting)     Past Surgical History:  Procedure Laterality Date  . ABDOMINAL EXPOSURE N/A 02/17/2018   Procedure: ABDOMINAL EXPOSURE;  Surgeon: Rosetta Posner, MD;  Location: [facility] OR;  Service: Vascular;  Laterality: N/A;  . ANKLE SURGERY     x 2  . ANTERIOR LAT LUMBAR FUSION N/A 02/17/2018   Procedure: Lumbar three-four Lumbar four-five  Anterolateral lumbar interbody fusion;  Surgeon: Kristeen Miss, MD;  Location: [facility];  Service: Neurosurgery;  Laterality: N/A;  . ANTERIOR LUMBAR FUSION N/A 02/17/2018   Procedure: Lumbar five-Sacral one Anterior lumbar interbody fusion;  Surgeon: Kristeen Miss, MD;  Location: [place];  Service: Neurosurgery;  Laterality: N/A;  . APPLICATION OF ROBOTIC ASSISTANCE FOR SPINAL PROCEDURE N/A 02/17/2018   Procedure: APPLICATION OF ROBOTIC ASSISTANCE FOR  SPINAL PROCEDURE;  Surgeon: Kristeen Miss, MD;  Location: [facility];  Service: Neurosurgery;  Laterality: N/A;  . APPLICATION OF ROBOTIC ASSISTANCE FOR SPINAL PROCEDURE N/A 06/09/2018   Procedure: APPLICATION OF ROBOTIC ASSISTANCE FOR SPINAL PROCEDURE;  Surgeon: Kristeen Miss, MD;  Location: [place];  Service: Neurosurgery;  Laterality: N/A;  . BACK SURGERY     2017  discectomy  . CARPAL TUNNEL RELEASE Left 07/04/2014   Procedure: LEFT CARPAL TUNNEL RELEASE;  Surgeon: Ninetta Lights, MD;  Location: [facility];  Service: Orthopedics;  Laterality: Left;  . CARPAL TUNNEL RELEASE Right 08/08/2014   Procedure: RIGHT CARPAL TUNNEL RELEASE;  Surgeon: Ninetta Lights, MD;  Location: [facility];  Service: Orthopedics;  Laterality: Right;  . DILATION AND CURETTAGE OF UTERUS    . DILATION AND EVACUATION  04/02/2011   Procedure: DILATATION AND EVACUATION (D&E);  Surgeon: Luz Lex, MD;  Location: [facility];  Service: Gynecology;  Laterality: N/A;  dvt left mid thigh  . DORSAL COMPARTMENT RELEASE Left 07/04/2014   Procedure: LEFT DEQUERVAINS;  Surgeon: Ninetta Lights, MD;  Location: [place];  Service: Orthopedics;  Laterality: Left;  . ENDOSCOPIC PLANTAR FASCIOTOMY Left 02/01/2002  . HARDWARE REMOVAL Right 06/09/2018   Procedure: Repositioning of Right Lumbar three Right lumbar  four pedicle screw with MAZOR;  Surgeon: Kristeen Miss, MD;  Location: [facility];  Service: Neurosurgery;  Laterality: Right;  . HYSTEROSCOPY W/D&C  07/17/2010   with exc. endometrial polyps  . KNEE ARTHROSCOPY Right 03/07/2003; 01/14/2005; 08/31/2006  . KNEE ARTHROSCOPY Left 03/21/2003; 11/26/2004  . LUMBAR PERCUTANEOUS PEDICLE SCREW 3 LEVEL N/A 02/17/2018   Procedure: LUMBAR PERCUTANEOUS PEDICLE SCREW PLACEMENT LUMBAR THREE-SACRAL ONE;  Surgeon: Kristeen Miss, MD;  Location: [facility];  Service: Neurosurgery;  Laterality: N/A;  . SHOULDER ARTHROSCOPY Right   . TOE SURGERY Left 01/10/2003   claw toe  correction 2nd, 3rd, 4th toes  . TOTAL HIP ARTHROPLASTY Right 04/21/2016  . TOTAL HIP ARTHROPLASTY Right 04/21/2016   Procedure: TOTAL HIP ARTHROPLASTY ANTERIOR APPROACH;  Surgeon: Ninetta Lights, MD;  Location: [facility];  Service: Orthopedics;  Laterality: Right;  . TOTAL KNEE ARTHROPLASTY Left 01/21/2010  . TOTAL KNEE ARTHROPLASTY Right 08/26/2008    There were no vitals filed for this visit.  Subjective Assessment - 08/14/18 1323    Subjective  Decreased pain today but pt very fatigued today, daughter woke up last night and didn't go back to sleep until 5am.  Her mom told her she thinks she is walking better.  Still having a hard time with stairs.    Pertinent History  Past Surgical Hx: B TKA (2009, 2011) and R THA 2017. PMH includes; OA, Morbid Obesity, Depression, HTN, Lumbar Spondylosis, Polycystic Ovarian Syndrome, Pneumonia, and PONV    Limitations  Standing;Walking;Lifting;Sitting    How long can you sit comfortably?  Sitting is uncomfortable. No amount of time specified.     How long can you stand comfortably?  about 5-10 mins    How long can you walk comfortably?  Short period of time with no time period specified    Patient Stated Goals  Reports goal of being more mobile with increased flexibility. Wants to "atleast put lotion on her feet."    Currently in Pain?  Yes    Pain Score  2     Pain Location  Hip    Pain Orientation  Right    Pain Descriptors / Indicators  Aching    Pain Type  Chronic pain    Pain Onset  More than a month ago                       [facility] Adult PT Treatment/Exercise - 08/14/18 1359      Ambulation/Gait   Ambulation/Gait  Yes    Ambulation/Gait Assistance  6: Modified independent (Device/Increase time)    Ambulation/Gait Assistance Details  Bioness in gait mode, parameters adjusted    Ambulation Distance (Feet)  115 Feet    Assistive device  None    Gait Pattern  Step-through pattern    Stairs  Yes    Stairs Assistance  6: Modified  independent (Device/Increase time)    Stairs Assistance Details (indicate cue type and reason)  with Bioness on in gait mode to assist with hip flexion when ascending and knee stability when pushing through RLE to ascend and to control when descending    Stair Management Technique  One rail Right;Alternating pattern;Forwards    Number of Stairs  8    Height of Stairs  6      Knee/Hip Exercises: Stretches   Hip Flexor Stretch  Right;3 reps;30 seconds   hip flexor and hip ADD mm     Knee/Hip Exercises: Standing   Other Standing Knee Exercises  on Curb performed tap downs  standing on RLE with Bioness on in training mode tapping L foot down to 4" step, 2" step and then all the way down to floor 6" x 10 reps each with min A, no UE support for eccentric and concentric strengthening      Modalities   Modalities  Electrical Stimulation      Electrical Stimulation   Electrical Stimulation Location  R LE quad and tibialis anterior. Only upper cuff providing stimulation to quad.     Electrical Stimulation Action  open and closed chain knee extension    Electrical Stimulation Parameters  Tablet 2    Electrical Stimulation Goals  Strength;Pain;Neuromuscular facilitation               PT Short Term Goals - 08/10/18 0956      PT SHORT TERM GOAL #1   Title  Pt will initiate HEP in order to indicate improved functional mobility and decreased fall risk.  (STG DUE 11/13)    Baseline  met 08/07/18    Time  4    Period  Weeks    Status  Achieved      PT SHORT TERM GOAL #2   Title  Pt will improve gait speed to >/= 2.57 ft/sec with LRAD in order to indicate decreased fall risk     Baseline  10/14: 2.27 ft/ sec with no AD to 3.16 ft/sec without AD on 08/07/18    Time  4    Period  Weeks    Status  Achieved      PT SHORT TERM GOAL #3   Title  Pt will perform 5TSS </=17.57 secs with single UE support only in order to indicate decreased fall risk and improved functional strength.       Baseline  10/14: 22.74 from standard height chair with B UE assist to 16.38 secs without UE support.     Time  4    Period  Weeks    Status  Achieved      PT SHORT TERM GOAL #4   Title  Pt will improve BERG balance score to >/=34/56 in order to indicate decreased fall risk.     Baseline  10/14: 30/56 indicative of high fall risk potential to 51/56 on 08/07/18    Time  4    Period  Weeks    Status  Achieved      PT SHORT TERM GOAL #5   Title  Pt will negotiate up/down 8 steps with bilateral rails with supervision in order to indicate improved LE strength    Baseline  met with single rail reciprocal pattern 08/07/18    Time  4    Period  Weeks    Status  Achieved      PT SHORT TERM GOAL #6   Title  Pt will ambulate 300' on uneven surfaces, ramps, curbs, inclines, demonstrating ability to vary gait speed when cued using LRAD and therapist providing supervision to improve safety with community distances    met 08/07/18   Time  4    Period  Weeks    Status  Achieved      PT SHORT TERM GOAL #7   Title  Pt will participate in 6 min walk test to assess baseline endurance with functional mobility     Time  4    Period  Weeks    Status  Achieved        PT Long Term Goals - 08/10/18 1749  PT LONG TERM GOAL #1   Title  Pt will be independent with final HEP in order to indicate improved functional mobilty and decreased fall risk.   (LTG DUE 12/13)    Time  8    Period  Weeks    Status  New      PT LONG TERM GOAL #2   Title  Pt will improve gait speed to >/=3.67 ft/sec w/ LRAD in order to indicate decreased fall risk and safe community ambulator.     Baseline  3.16 ft/sec on 08/07/18, updated due to being only .01 sec off from goal    Period  Weeks    Status  Revised      PT LONG TERM GOAL #3   Title  Pt will perform 5TSS in </=13 secs without UE support in order to indicate decreased fall risk and improved functional strength.     Baseline  16.38 secs without UE support      Time  8    Period  Weeks    Status  Revised      PT LONG TERM GOAL #4   Title  Pt will improve BERG balance score to >/=38/56 in order to indicate decreased fall risk.      Baseline  met 51/56 08/07/18    Time  8    Period  Weeks    Status  Achieved      PT LONG TERM GOAL #5   Title  Pt will negotiate 8 steps with single HR and LRAD utilizing reciprocal stepping technique at Mod I level to increase safety with community accessibility.     Baseline  met 08/07/18    Time  8    Period  Weeks    Status  Achieved      PT LONG TERM GOAL #6   Title  Pt will ambulate 500 feet outdoors with LRAD navigating uneven terrain, curbs, ramps, and demonstrating ability to vary gait speed at Mod I level to improve functional mobility.     Time  8    Period  Weeks    Status  New      PT LONG TERM GOAL #7   Title  Pt will improve 6 min walk test by 300' indicating improvement with functional endurance    10/25: Patient performs 675 with rollator to 818' 08/10/18   Time  8    Period  Weeks    Status  Revised            Plan - 08/14/18 1621    Clinical Impression Statement  Continued use of functional electrical stimulation to R quad for functional strengthening during graduated step downs and then carry over to functional task of negotiating stairs with alternating sequence.  During stairs pt noted to have muscular fatigue but tolerated well.  Will continue to address to allow pt to perform without use of UE to indicate increased LE strength.      Rehab Potential  Good    Clinical Impairments Affecting Rehab Potential  Pt is motivated to improve. Strength gains may be limited 2/2 to nerve regeneration process     PT Frequency  2x / week    PT Duration  8 weeks    PT Treatment/Interventions  ADLs/Self Care Home Management;Aquatic Therapy;DME Instruction;Gait training;Stair training;Functional mobility training;Therapeutic activities;Therapeutic exercise;Balance training;Neuromuscular  re-education;Patient/family education;Electrical Stimulation;Passive range of motion;Energy conservation    PT Next Visit Plan  Need TDN in quad after Monday's session?  Continue Bioness  to R quad during gait, stairs, ramp/curb and exercises,  Pump up her physioball if she brings it.  Set up her NMES unit if she brings to session and give exercises to do with this on at home.  Endurance, increasing gait speed (treadmill)    PT Home Exercise Plan   [id number]     Consulted and Agree with Plan of Care  Patient       Patient will benefit from skilled therapeutic intervention in order to improve the following deficits and impairments:  Abnormal gait, Decreased activity tolerance, Decreased balance, Decreased coordination, Decreased endurance, Decreased mobility, Decreased range of motion, Decreased strength, Impaired perceived functional ability, Impaired flexibility, Impaired sensation, Pain, Decreased knowledge of use of DME, Decreased knowledge of precautions, Obesity  Visit Diagnosis: Unsteadiness on feet  Muscle weakness (generalized)  Chronic midline low back pain without sciatica     Problem List Patient Active Problem List   Diagnosis Date Noted  . Lumbosacral radiculopathy at L4 06/09/2018  . Generalized anxiety disorder   . Pain   . Fall   . Labile blood pressure   . Neuropathic pain   . Therapeutic opioid induced constipation   . Urinary retention   . Lumbar radiculopathy 02/21/2018  . PCOS (polycystic ovarian syndrome)   . Morbid obesity ([place])   . Depression   . Generalized OA   . Postoperative pain   . Acute blood loss anemia   . Status post surgery 02/17/2018  . Lumbar radiculopathy, chronic 02/17/2018  . Primary localized osteoarthritis of right hip 04/21/2016  . Sinusitis 06/25/2015  . Cough 06/25/2015  . Essential hypertension 04/03/2015  . Dehydration 04/03/2015  . CAP (community acquired pneumonia) 03/31/2015  . Right lower lobe pneumonia ([place]) 03/31/2015  .  Fever 03/31/2015  . Pregnancy 04/29/2014  . Miscarriage   . Incomplete miscarriage 04/02/2011    Class: Acute  . 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 [place], [place], [address] Phone: [phone]   Fax:  [phone]  Name: Ashley [name] MRN: [id number] Date of Birth: 27-Jun-1976

## 2018-08-15 ENCOUNTER — Encounter: Payer: Self-pay | Admitting: Physical Therapy

## 2018-08-15 ENCOUNTER — Ambulatory Visit: Payer: BLUE CROSS/BLUE SHIELD | Admitting: Physical Therapy

## 2018-08-15 DIAGNOSIS — M545 Low back pain, unspecified: Secondary | ICD-10-CM

## 2018-08-15 DIAGNOSIS — R2681 Unsteadiness on feet: Secondary | ICD-10-CM

## 2018-08-15 DIAGNOSIS — M6281 Muscle weakness (generalized): Secondary | ICD-10-CM

## 2018-08-15 DIAGNOSIS — G8929 Other chronic pain: Secondary | ICD-10-CM

## 2018-08-15 NOTE — Therapy (Signed)
[facility] [address] [place], [place], [address] Phone: [phone]   Fax:  [phone]  Physical Therapy Treatment  Patient Details  Name: Ashley [name] MRN: [id number] Date of Birth: 1975-11-01 Referring Provider (PT): Dr. Kristeen Miss    Encounter Date: 08/15/2018  PT End of Session - 08/15/18 0945    Visit Number  12    Number of Visits  17    Date for PT Re-Evaluation  09/01/18    Authorization Type  [id number] 22 visits remaining    Authorization - Visit Number  12    Authorization - Number of Visits  22    PT Start Time  0849    PT Stop Time  0929    PT Time Calculation (min)  40 min    Activity Tolerance  Patient tolerated treatment well    Behavior During Therapy  [facility] for tasks assessed/performed       Past Medical History:  Diagnosis Date  . Arthritis    knees, back  . 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 Complication of anesthesia   . Depression   . History of pneumonia   . Hypertension   . Lumbar spondylosis   . PCOS (polycystic ovarian syndrome)   . Pneumonia    2016  . PONV (postoperative nausea and vomiting)     Past Surgical History:  Procedure Laterality Date  . ABDOMINAL EXPOSURE N/A 02/17/2018   Procedure: ABDOMINAL EXPOSURE;  Surgeon: Rosetta Posner, MD;  Location: [facility] OR;  Service: Vascular;  Laterality: N/A;  . ANKLE SURGERY     x 2  . ANTERIOR LAT LUMBAR FUSION N/A 02/17/2018   Procedure: Lumbar three-four Lumbar four-five  Anterolateral lumbar interbody fusion;  Surgeon: Kristeen Miss, MD;  Location: [facility];  Service: Neurosurgery;  Laterality: N/A;  . ANTERIOR LUMBAR FUSION N/A 02/17/2018   Procedure: Lumbar five-Sacral one Anterior lumbar interbody fusion;  Surgeon: Kristeen Miss, MD;  Location: [facility];  Service: Neurosurgery;  Laterality: N/A;  . APPLICATION OF ROBOTIC ASSISTANCE FOR SPINAL PROCEDURE N/A 02/17/2018   Procedure: APPLICATION OF ROBOTIC ASSISTANCE FOR  SPINAL PROCEDURE;  Surgeon: Kristeen Miss, MD;  Location: [facility];  Service: Neurosurgery;  Laterality: N/A;  . APPLICATION OF ROBOTIC ASSISTANCE FOR SPINAL PROCEDURE N/A 06/09/2018   Procedure: APPLICATION OF ROBOTIC ASSISTANCE FOR SPINAL PROCEDURE;  Surgeon: Kristeen Miss, MD;  Location: [facility];  Service: Neurosurgery;  Laterality: N/A;  . BACK SURGERY     2017  discectomy  . CARPAL TUNNEL RELEASE Left 07/04/2014   Procedure: LEFT CARPAL TUNNEL RELEASE;  Surgeon: Ninetta Lights, MD;  Location: [facility];  Service: Orthopedics;  Laterality: Left;  . CARPAL TUNNEL RELEASE Right 08/08/2014   Procedure: RIGHT CARPAL TUNNEL RELEASE;  Surgeon: Ninetta Lights, MD;  Location: [facility];  Service: Orthopedics;  Laterality: Right;  . DILATION AND CURETTAGE OF UTERUS    . DILATION AND EVACUATION  04/02/2011   Procedure: DILATATION AND EVACUATION (D&E);  Surgeon: Luz Lex, MD;  Location: [facility];  Service: Gynecology;  Laterality: N/A;  dvt left mid thigh  . DORSAL COMPARTMENT RELEASE Left 07/04/2014   Procedure: LEFT DEQUERVAINS;  Surgeon: Ninetta Lights, MD;  Location: [facility];  Service: Orthopedics;  Laterality: Left;  . ENDOSCOPIC PLANTAR FASCIOTOMY Left 02/01/2002  . HARDWARE REMOVAL Right 06/09/2018   Procedure: Repositioning of Right Lumbar three Right lumbar  four pedicle screw with MAZOR;  Surgeon: Kristeen Miss, MD;  Location: [facility];  Service: Neurosurgery;  Laterality: Right;  . HYSTEROSCOPY W/D&C  07/17/2010   with exc. endometrial polyps  . KNEE ARTHROSCOPY Right 03/07/2003; 01/14/2005; 08/31/2006  . KNEE ARTHROSCOPY Left 03/21/2003; 11/26/2004  . LUMBAR PERCUTANEOUS PEDICLE SCREW 3 LEVEL N/A 02/17/2018   Procedure: LUMBAR PERCUTANEOUS PEDICLE SCREW PLACEMENT LUMBAR THREE-SACRAL ONE;  Surgeon: Kristeen Miss, MD;  Location: [facility];  Service: Neurosurgery;  Laterality: N/A;  . SHOULDER ARTHROSCOPY Right   . TOE SURGERY Left 01/10/2003   claw toe  correction 2nd, 3rd, 4th toes  . TOTAL HIP ARTHROPLASTY Right 04/21/2016  . TOTAL HIP ARTHROPLASTY Right 04/21/2016   Procedure: TOTAL HIP ARTHROPLASTY ANTERIOR APPROACH;  Surgeon: Ninetta Lights, MD;  Location: [facility];  Service: Orthopedics;  Laterality: Right;  . TOTAL KNEE ARTHROPLASTY Left 01/21/2010  . TOTAL KNEE ARTHROPLASTY Right 08/26/2008    There were no vitals filed for this visit.  Subjective Assessment - 08/15/18 0945    Subjective  mild muscle soreness from yesterdays session - brought in physioball to be pumped up    Pertinent History  Past Surgical Hx: B TKA (2009, 2011) and R THA 2017. PMH includes; OA, Morbid Obesity, Depression, HTN, Lumbar Spondylosis, Polycystic Ovarian Syndrome, Pneumonia, and PONV    Patient Stated Goals  Reports goal of being more mobile with increased flexibility. Wants to "atleast put lotion on her feet."    Currently in Pain?  Yes    Pain Score  2     Pain Location  Leg    Pain Orientation  Right    Pain Descriptors / Indicators  Aching;Sore                       [facility] Adult PT Treatment/Exercise - 08/15/18 0001      Ambulation/Gait   Stairs  Yes    Stairs Assistance  6: Modified independent (Device/Increase time)    Stairs Assistance Details (indicate cue type and reason)  focusing on step through pattern with eccentric control    Stair Management Technique  One rail Right;Alternating pattern;Forwards    Number of Stairs  12    Height of Stairs  6      Exercises   Exercises  Knee/Hip      Knee/Hip Exercises: Standing   Other Standing Knee Exercises  mini squat on BOSU (up) x 12    Other Standing Knee Exercises  resisted fwd and retro gait in // bars with red tband; resisted side stepping with red tband in // bars - all performed in mini squat position      Knee/Hip Exercises: Supine   Straight Leg Raises  Strengthening;Right;10 reps   very light AAROM   Straight Leg Raise with External Rotation  Strengthening;Right;10  reps   mild AAROM     Manual Therapy   Manual Therapy  Soft tissue mobilization;Myofascial release    Manual therapy comments  skillful palpation thorughout TDN treatment    Soft tissue mobilization  STM over R quad - palpable trigger points identified with pain reproduction    Myofascial Release  manual trigger point release to R VL and rectus femoris       Trigger Point Dry Needling - 08/15/18 1021    Consent Given?  Yes    Muscles Treated Lower Body  Quadriceps   R only   Quadriceps Response  Twitch response elicited;Palpable increased muscle length  PT Short Term Goals - 08/10/18 0956      PT SHORT TERM GOAL #1   Title  Pt will initiate HEP in order to indicate improved functional mobility and decreased fall risk.  (STG DUE 11/13)    Baseline  met 08/07/18    Time  4    Period  Weeks    Status  Achieved      PT SHORT TERM GOAL #2   Title  Pt will improve gait speed to >/= 2.57 ft/sec with LRAD in order to indicate decreased fall risk     Baseline  10/14: 2.27 ft/ sec with no AD to 3.16 ft/sec without AD on 08/07/18    Time  4    Period  Weeks    Status  Achieved      PT SHORT TERM GOAL #3   Title  Pt will perform 5TSS </=17.57 secs with single UE support only in order to indicate decreased fall risk and improved functional strength.      Baseline  10/14: 22.74 from standard height chair with B UE assist to 16.38 secs without UE support.     Time  4    Period  Weeks    Status  Achieved      PT SHORT TERM GOAL #4   Title  Pt will improve BERG balance score to >/=34/56 in order to indicate decreased fall risk.     Baseline  10/14: 30/56 indicative of high fall risk potential to 51/56 on 08/07/18    Time  4    Period  Weeks    Status  Achieved      PT SHORT TERM GOAL #5   Title  Pt will negotiate up/down 8 steps with bilateral rails with supervision in order to indicate improved LE strength    Baseline  met with single rail reciprocal pattern  08/07/18    Time  4    Period  Weeks    Status  Achieved      PT SHORT TERM GOAL #6   Title  Pt will ambulate 300' on uneven surfaces, ramps, curbs, inclines, demonstrating ability to vary gait speed when cued using LRAD and therapist providing supervision to improve safety with community distances    met 08/07/18   Time  4    Period  Weeks    Status  Achieved      PT SHORT TERM GOAL #7   Title  Pt will participate in 6 min walk test to assess baseline endurance with functional mobility     Time  4    Period  Weeks    Status  Achieved        PT Long Term Goals - 08/10/18 0957      PT LONG TERM GOAL #1   Title  Pt will be independent with final HEP in order to indicate improved functional mobilty and decreased fall risk.   (LTG DUE 12/13)    Time  8    Period  Weeks    Status  New      PT LONG TERM GOAL #2   Title  Pt will improve gait speed to >/=3.67 ft/sec w/ LRAD in order to indicate decreased fall risk and safe community ambulator.     Baseline  3.16 ft/sec on 08/07/18, updated due to being only .01 sec off from goal    Period  Weeks    Status  Revised      PT LONG TERM GOAL #  3   Title  Pt will perform 5TSS in </=13 secs without UE support in order to indicate decreased fall risk and improved functional strength.     Baseline  16.38 secs without UE support     Time  8    Period  Weeks    Status  Revised      PT LONG TERM GOAL #4   Title  Pt will improve BERG balance score to >/=38/56 in order to indicate decreased fall risk.      Baseline  met 51/56 08/07/18    Time  8    Period  Weeks    Status  Achieved      PT LONG TERM GOAL #5   Title  Pt will negotiate 8 steps with single HR and LRAD utilizing reciprocal stepping technique at Mod I level to increase safety with community accessibility.     Baseline  met 08/07/18    Time  8    Period  Weeks    Status  Achieved      PT LONG TERM GOAL #6   Title  Pt will ambulate 500 feet outdoors with LRAD navigating  uneven terrain, curbs, ramps, and demonstrating ability to vary gait speed at Mod I level to improve functional mobility.     Time  8    Period  Weeks    Status  New      PT LONG TERM GOAL #7   Title  Pt will improve 6 min walk test by 300' indicating improvement with functional endurance    10/25: Patient performs 675 with rollator to 818' 08/10/18   Time  8    Period  Weeks    Status  Revised            Plan - 08/15/18 0947    Clinical Impression Statement  Patient with soreness and palpable trigger points with deep palpation to R quad - TDN to this area with good twistch repsonse and reduced tissue tension. Remainder of session focsuing on quad strength with good tolerance, however does require rest breaks as well as cueing for breathing patterns as patient tends to hold breath. Making good progress towards goals.     Rehab Potential  Good    Clinical Impairments Affecting Rehab Potential  Pt is motivated to improve. Strength gains may be limited 2/2 to nerve regeneration process     PT Frequency  2x / week    PT Duration  8 weeks    PT Treatment/Interventions  ADLs/Self Care Home Management;Aquatic Therapy;DME Instruction;Gait training;Stair training;Functional mobility training;Therapeutic activities;Therapeutic exercise;Balance training;Neuromuscular re-education;Patient/family education;Electrical Stimulation;Passive range of motion;Energy conservation    PT Next Visit Plan  Continue [place] to R quad during gait, stairs, ramp/curb and exercises,  Pump up her physioball if she brings it.  Set up her NMES unit if she brings to session and give exercises to do with this on at home.  Endurance, increasing gait speed (treadmill)    PT Home Exercise Plan   [id number]     Consulted and Agree with Plan of Care  Patient       Patient will benefit from skilled therapeutic intervention in order to improve the following deficits and impairments:  Abnormal gait, Decreased activity tolerance,  Decreased balance, Decreased coordination, Decreased endurance, Decreased mobility, Decreased range of motion, Decreased strength, Impaired perceived functional ability, Impaired flexibility, Impaired sensation, Pain, Decreased knowledge of use of DME, Decreased knowledge of precautions, Obesity  Visit Diagnosis: Unsteadiness on feet  Muscle weakness (generalized)  Chronic midline low back pain without sciatica     Problem List Patient Active Problem List   Diagnosis Date Noted  . Lumbosacral radiculopathy at L4 06/09/2018  . Generalized anxiety disorder   . Pain   . Fall   . Labile blood pressure   . Neuropathic pain   . Therapeutic opioid induced constipation   . Urinary retention   . Lumbar radiculopathy 02/21/2018  . PCOS (polycystic ovarian syndrome)   . Morbid obesity ([place])   . Depression   . Generalized OA   . Postoperative pain   . Acute blood loss anemia   . Status post surgery 02/17/2018  . Lumbar radiculopathy, chronic 02/17/2018  . Primary localized osteoarthritis of right hip 04/21/2016  . Sinusitis 06/25/2015  . Cough 06/25/2015  . Essential hypertension 04/03/2015  . Dehydration 04/03/2015  . CAP (community acquired pneumonia) 03/31/2015  . Right lower lobe pneumonia ([place]) 03/31/2015  . Fever 03/31/2015  . Pregnancy 04/29/2014  . Miscarriage   . Incomplete miscarriage 04/02/2011    Class: Acute  . Deep vein thrombosis (DVT) ([place]) 04/02/2011    Class: Chronic    Lanney Gins, PT, DPT Supplemental Physical Therapist 08/15/18 10:34 AM Pager: [phone] Office: [facility] [address] [place], [place], [address] Phone: [phone]   Fax:  [phone]  Name: Ashley [name] MRN: [id number] Date of Birth: September 26, 1975

## 2018-08-21 ENCOUNTER — Encounter: Payer: Self-pay | Admitting: Rehabilitation

## 2018-08-21 ENCOUNTER — Ambulatory Visit: Payer: BLUE CROSS/BLUE SHIELD | Attending: Neurological Surgery | Admitting: Rehabilitation

## 2018-08-21 DIAGNOSIS — R2681 Unsteadiness on feet: Secondary | ICD-10-CM | POA: Diagnosis present

## 2018-08-21 DIAGNOSIS — M6281 Muscle weakness (generalized): Secondary | ICD-10-CM | POA: Diagnosis present

## 2018-08-21 DIAGNOSIS — M545 Low back pain: Secondary | ICD-10-CM | POA: Insufficient documentation

## 2018-08-21 DIAGNOSIS — G8929 Other chronic pain: Secondary | ICD-10-CM | POA: Insufficient documentation

## 2018-08-21 NOTE — Patient Instructions (Signed)
Access Code: [email or web address]  URL: [email or web address]  Date: 08/21/2018  Prepared by: Cameron Sprang   Exercises  Swiss Ball March and Kick - 10 reps - 1 sets - 2x daily - 7x weekly  Seated Scapular Retraction - 10 reps - 1 sets - 1x daily - 7x weekly  Seated Shoulder Diagonal Pulls with Resistance - 10 reps - 3 sets - 1x daily - 7x weekly  Bird Dog - 5 reps - 2 sets - 1x daily - 7x weekly  Standard Plank - 5 reps - 2 sets - 4-5 hold - 1x daily - 7x weekly

## 2018-08-21 NOTE — Therapy (Signed)
[facility] [address] [place], [place], [address] Phone: [phone]   Fax:  [phone]  Physical Therapy Treatment  Patient Details  Name: Ashley [name] MRN: [id number] Date of Birth: 02-03-1976 Referring Provider (PT): Dr. Kristeen Miss    Encounter Date: 08/21/2018  PT End of Session - 08/21/18 0853    Visit Number  13    Number of Visits  17    Date for PT Re-Evaluation  09/01/18    Authorization Type  [employer]-Has 22 visits remaining    Authorization - Visit Number  56    Authorization - Number of Visits  22    PT Start Time  0849    PT Stop Time  0930    PT Time Calculation (min)  41 min    Activity Tolerance  Patient tolerated treatment well    Behavior During Therapy  [facility] for tasks assessed/performed       Past Medical History:  Diagnosis Date  . Arthritis    knees, back  . 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 Complication of anesthesia   . Depression   . History of pneumonia   . Hypertension   . Lumbar spondylosis   . PCOS (polycystic ovarian syndrome)   . Pneumonia    2016  . PONV (postoperative nausea and vomiting)     Past Surgical History:  Procedure Laterality Date  . ABDOMINAL EXPOSURE N/A 02/17/2018   Procedure: ABDOMINAL EXPOSURE;  Surgeon: Rosetta Posner, MD;  Location: [facility];  Service: Vascular;  Laterality: N/A;  . ANKLE SURGERY     x 2  . ANTERIOR LAT LUMBAR FUSION N/A 02/17/2018   Procedure: Lumbar three-four Lumbar four-five  Anterolateral lumbar interbody fusion;  Surgeon: Kristeen Miss, MD;  Location: [place];  Service: Neurosurgery;  Laterality: N/A;  . ANTERIOR LUMBAR FUSION N/A 02/17/2018   Procedure: Lumbar five-Sacral one Anterior lumbar interbody fusion;  Surgeon: Kristeen Miss, MD;  Location: [place];  Service: Neurosurgery;  Laterality: N/A;  . APPLICATION OF ROBOTIC ASSISTANCE FOR SPINAL PROCEDURE N/A 02/17/2018   Procedure: APPLICATION OF ROBOTIC ASSISTANCE FOR  SPINAL PROCEDURE;  Surgeon: Kristeen Miss, MD;  Location: [place];  Service: Neurosurgery;  Laterality: N/A;  . APPLICATION OF ROBOTIC ASSISTANCE FOR SPINAL PROCEDURE N/A 06/09/2018   Procedure: APPLICATION OF ROBOTIC ASSISTANCE FOR SPINAL PROCEDURE;  Surgeon: Kristeen Miss, MD;  Location: [place];  Service: Neurosurgery;  Laterality: N/A;  . BACK SURGERY     2017  discectomy  . CARPAL TUNNEL RELEASE Left 07/04/2014   Procedure: LEFT CARPAL TUNNEL RELEASE;  Surgeon: Ninetta Lights, MD;  Location: [place];  Service: Orthopedics;  Laterality: Left;  . CARPAL TUNNEL RELEASE Right 08/08/2014   Procedure: RIGHT CARPAL TUNNEL RELEASE;  Surgeon: Ninetta Lights, MD;  Location: [place];  Service: Orthopedics;  Laterality: Right;  . DILATION AND CURETTAGE OF UTERUS    . DILATION AND EVACUATION  04/02/2011   Procedure: DILATATION AND EVACUATION (D&E);  Surgeon: Luz Lex, MD;  Location: [facility];  Service: Gynecology;  Laterality: N/A;  dvt left mid thigh  . DORSAL COMPARTMENT RELEASE Left 07/04/2014   Procedure: LEFT DEQUERVAINS;  Surgeon: Ninetta Lights, MD;  Location: [place];  Service: Orthopedics;  Laterality: Left;  . ENDOSCOPIC PLANTAR FASCIOTOMY Left 02/01/2002  . HARDWARE REMOVAL Right 06/09/2018   Procedure: Repositioning of Right Lumbar three Right lumbar  four pedicle screw with MAZOR;  Surgeon: Kristeen Miss, MD;  Location: [place];  Service: Neurosurgery;  Laterality: Right;  . HYSTEROSCOPY W/D&C  07/17/2010   with exc. endometrial polyps  . KNEE ARTHROSCOPY Right 03/07/2003; 01/14/2005; 08/31/2006  . KNEE ARTHROSCOPY Left 03/21/2003; 11/26/2004  . LUMBAR PERCUTANEOUS PEDICLE SCREW 3 LEVEL N/A 02/17/2018   Procedure: LUMBAR PERCUTANEOUS PEDICLE SCREW PLACEMENT LUMBAR THREE-SACRAL ONE;  Surgeon: Kristeen Miss, MD;  Location: [facility];  Service: Neurosurgery;  Laterality: N/A;  . SHOULDER ARTHROSCOPY Right   . TOE SURGERY Left 01/10/2003   claw toe  correction 2nd, 3rd, 4th toes  . TOTAL HIP ARTHROPLASTY Right 04/21/2016  . TOTAL HIP ARTHROPLASTY Right 04/21/2016   Procedure: TOTAL HIP ARTHROPLASTY ANTERIOR APPROACH;  Surgeon: Ninetta Lights, MD;  Location: [place];  Service: Orthopedics;  Laterality: Right;  . TOTAL KNEE ARTHROPLASTY Left 01/21/2010  . TOTAL KNEE ARTHROPLASTY Right 08/26/2008    There were no vitals filed for this visit.  Subjective Assessment - 08/21/18 0850    Subjective  Pt reports hairline fracture in R 5th toe moving Christmas tree yesterday. Was given surgical boot, but not given any precautions.     Pertinent History  Past Surgical Hx: B TKA (2009, 2011) and R THA 2017. PMH includes; OA, Morbid Obesity, Depression, HTN, Lumbar Spondylosis, Polycystic Ovarian Syndrome, Pneumonia, and PONV    Limitations  Standing;Walking;Lifting;Sitting    How long can you sit comfortably?  Sitting is uncomfortable. No amount of time specified.     How long can you stand comfortably?  about 5-10 mins    How long can you walk comfortably?  Short period of time with no time period specified    Patient Stated Goals  Reports goal of being more mobile with increased flexibility. Wants to "atleast put lotion on her feet."    Currently in Pain?  Yes    Pain Score  2     Pain Location  Toe (Comment which one)    Pain Orientation  Right    Pain Descriptors / Indicators  Sore    Pain Type  Acute pain    Pain Onset  More than a month ago    Pain Frequency  Constant    Aggravating Factors   walking    Pain Relieving Factors  not walking    Multiple Pain Sites  Yes    Pain Score  4    Pain Location  Hip    Pain Orientation  Right    Pain Descriptors / Indicators  Aching;Sore    Pain Type  Chronic pain    Pain Onset  More than a month ago    Pain Frequency  Constant    Aggravating Factors   turning over in bed, doing exercises    Pain Relieving Factors  stretches                        [facility] Adult PT Treatment/Exercise  - 08/21/18 0900      Ambulation/Gait   Ambulation/Gait  Yes    Ambulation/Gait Assistance  6: Modified independent (Device/Increase time);5: Supervision    Ambulation/Gait Assistance Details  Had pt ambulate as active rest between exercises.  Continue to give min cues for increased stride length and gait speed.  Pt somewhat limited today due to pain in R 5th toe.     Ambulation Distance (Feet)  300 Feet    Assistive device  None    Stairs  Yes    Stairs Assistance  5: Supervision    Stairs Assistance Details (indicate cue type and reason)  Min cues for decreased hip adduction when descending to stabilize.      Stair Management Technique  One rail Right;Alternating pattern;Forwards    Number of Stairs  8    Height of Stairs  6      Self-Care   Self-Care  Other Self-Care Comments    Other Self-Care Comments   Discussed current POC and would like to extend pts POC x 3 more weeks to continue to address deficits and plan to end right before her cruise in January.  Pt verbalized understanding.  Also discussed home Bioness unit per pt request.  Feel that she is making excellent progress and will not need a home unit at this time.  Pt verbalized understanding.       Exercises   Exercises  Knee/Hip    Other Exercises   Core strengthening:  Seated on physioball performing LAQs x 10 reps each side without UE support, scapular retraction with green theraband x 10 reps, shoulder flex/abd (moving in diagonal pattern) with green band x 10 reps on each side.  Added these to HEP as pt now has ball inflated for home use.  See pt instruction.  Also performed quadruped alternating LE extension x 5 reps each side, alternating UE/LE extension x 10 reps total-added to HEP, and finally modified plank on elbows x 5 reps with 5 sec holds.        Knee/Hip Exercises: Aerobic   Elliptical  x 2 mins at no resistance with BUE support      Knee/Hip Exercises: Standing   Forward Lunges  4 sets;Other (comment);Both   3  reps along countertop   Forward Lunges Limitations  Min cues for posture and technique.     Wall Squat  1 set;Other (comment)   8 reps with ball squeeze         Access Code: [email or web address]  URL: [email or web address]  Date: 08/21/2018  Prepared by: Cameron Sprang   Exercises  Swiss Ball March and Kick - 10 reps - 1 sets - 2x daily - 7x weekly  Seated Scapular Retraction - 10 reps - 1 sets - 1x daily - 7x weekly  Seated Shoulder Diagonal Pulls with Resistance - 10 reps - 3 sets - 1x daily - 7x weekly  Bird Dog - 5 reps - 2 sets - 1x daily - 7x weekly  Standard Plank - 5 reps - 2 sets - 4-5 hold - 1x daily - 7x weekly   FYI:  Note that this is different code.  Pt wanted ball exercises on separate sheets.     PT Education - 08/21/18 1038    Education Details  Physioball exercises-see pt instruction     Person(s) Educated  Patient    Methods  Explanation;Demonstration;Handout    Comprehension  Verbalized understanding;Returned demonstration       PT Short Term Goals - 08/10/18 0956      PT SHORT TERM GOAL #1   Title  Pt will initiate HEP in order to indicate improved functional mobility and decreased fall risk.  (STG DUE 11/13)    Baseline  met 08/07/18    Time  4    Period  Weeks    Status  Achieved      PT SHORT TERM GOAL #2   Title  Pt will improve gait speed to >/= 2.57 ft/sec with LRAD in order to indicate decreased fall risk  Baseline  10/14: 2.27 ft/ sec with no AD to 3.16 ft/sec without AD on 08/07/18    Time  4    Period  Weeks    Status  Achieved      PT SHORT TERM GOAL #3   Title  Pt will perform 5TSS </=17.57 secs with single UE support only in order to indicate decreased fall risk and improved functional strength.      Baseline  10/14: 22.74 from standard height chair with B UE assist to 16.38 secs without UE support.     Time  4    Period  Weeks    Status  Achieved      PT SHORT TERM GOAL #4   Title  Pt will improve BERG balance score to  >/=34/56 in order to indicate decreased fall risk.     Baseline  10/14: 30/56 indicative of high fall risk potential to 51/56 on 08/07/18    Time  4    Period  Weeks    Status  Achieved      PT SHORT TERM GOAL #5   Title  Pt will negotiate up/down 8 steps with bilateral rails with supervision in order to indicate improved LE strength    Baseline  met with single rail reciprocal pattern 08/07/18    Time  4    Period  Weeks    Status  Achieved      PT SHORT TERM GOAL #6   Title  Pt will ambulate 300' on uneven surfaces, ramps, curbs, inclines, demonstrating ability to vary gait speed when cued using LRAD and therapist providing supervision to improve safety with community distances    met 08/07/18   Time  4    Period  Weeks    Status  Achieved      PT SHORT TERM GOAL #7   Title  Pt will participate in 6 min walk test to assess baseline endurance with functional mobility     Time  4    Period  Weeks    Status  Achieved        PT Long Term Goals - 08/10/18 0957      PT LONG TERM GOAL #1   Title  Pt will be independent with final HEP in order to indicate improved functional mobilty and decreased fall risk.   (LTG DUE 12/13)    Time  8    Period  Weeks    Status  New      PT LONG TERM GOAL #2   Title  Pt will improve gait speed to >/=3.67 ft/sec w/ LRAD in order to indicate decreased fall risk and safe community ambulator.     Baseline  3.16 ft/sec on 08/07/18, updated due to being only .01 sec off from goal    Period  Weeks    Status  Revised      PT LONG TERM GOAL #3   Title  Pt will perform 5TSS in </=13 secs without UE support in order to indicate decreased fall risk and improved functional strength.     Baseline  16.38 secs without UE support     Time  8    Period  Weeks    Status  Revised      PT LONG TERM GOAL #4   Title  Pt will improve BERG balance score to >/=38/56 in order to indicate decreased fall risk.      Baseline  met 51/56 08/07/18    Time  8  Period  Weeks    Status  Achieved      PT LONG TERM GOAL #5   Title  Pt will negotiate 8 steps with single HR and LRAD utilizing reciprocal stepping technique at Mod I level to increase safety with community accessibility.     Baseline  met 08/07/18    Time  8    Period  Weeks    Status  Achieved      PT LONG TERM GOAL #6   Title  Pt will ambulate 500 feet outdoors with LRAD navigating uneven terrain, curbs, ramps, and demonstrating ability to vary gait speed at Mod I level to improve functional mobility.     Time  8    Period  Weeks    Status  New      PT LONG TERM GOAL #7   Title  Pt will improve 6 min walk test by 300' indicating improvement with functional endurance    10/25: Patient performs 675 with rollator to 818' 08/10/18   Time  8    Period  Weeks    Status  Revised            Plan - 08/21/18 1038    Clinical Impression Statement  Pt continues to have soreness in R hip and also note she has new hairline fracture of R 5th toe that happened yesterday.  Went to urgent care and was given boot but no precautions.  Pt did not want to wear boot, therefore had tennis shoe donned in session.  Focused on core strengthening today as well as continued R quad strengthening.  Pt making excellent progress and discussed extending POC for another 3 weeks following next weeks visits.      Rehab Potential  Good    Clinical Impairments Affecting Rehab Potential  Pt is motivated to improve. Strength gains may be limited 2/2 to nerve regeneration process     PT Frequency  2x / week    PT Duration  8 weeks    PT Treatment/Interventions  ADLs/Self Care Home Management;Aquatic Therapy;DME Instruction;Gait training;Stair training;Functional mobility training;Therapeutic activities;Therapeutic exercise;Balance training;Neuromuscular re-education;Patient/family education;Electrical Stimulation;Passive range of motion;Energy conservation    PT Next Visit Plan  Jumping/plyometric type exercises?  Raquel Sarna to re-cert next week) Continue Bioness to R quad during gait, stairs, ramp/curb and exercises,  Set up her NMES unit if she brings to session and give exercises to do with this on at home.  Endurance, increasing gait speed (treadmill)    PT Home Exercise Plan   [id number]     Consulted and Agree with Plan of Care  Patient       Patient will benefit from skilled therapeutic intervention in order to improve the following deficits and impairments:  Abnormal gait, Decreased activity tolerance, Decreased balance, Decreased coordination, Decreased endurance, Decreased mobility, Decreased range of motion, Decreased strength, Impaired perceived functional ability, Impaired flexibility, Impaired sensation, Pain, Decreased knowledge of use of DME, Decreased knowledge of precautions, Obesity  Visit Diagnosis: Unsteadiness on feet  Muscle weakness (generalized)  Chronic midline low back pain without sciatica     Problem List Patient Active Problem List   Diagnosis Date Noted  . Lumbosacral radiculopathy at L4 06/09/2018  . Generalized anxiety disorder   . Pain   . Fall   . Labile blood pressure   . Neuropathic pain   . Therapeutic opioid induced constipation   . Urinary retention   . Lumbar radiculopathy 02/21/2018  . PCOS (polycystic ovarian syndrome)   .  Morbid obesity ([place])   . Depression   . Generalized OA   . Postoperative pain   . Acute blood loss anemia   . Status post surgery 02/17/2018  . Lumbar radiculopathy, chronic 02/17/2018  . Primary localized osteoarthritis of right hip 04/21/2016  . Sinusitis 06/25/2015  . Cough 06/25/2015  . Essential hypertension 04/03/2015  . Dehydration 04/03/2015  . CAP (community acquired pneumonia) 03/31/2015  . Right lower lobe pneumonia ([place]) 03/31/2015  . Fever 03/31/2015  . Pregnancy 04/29/2014  . Miscarriage   . Incomplete miscarriage 04/02/2011    Class: Acute  . 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

## 2018-08-23 ENCOUNTER — Ambulatory Visit: Payer: BLUE CROSS/BLUE SHIELD | Admitting: Physical Therapy

## 2018-08-25 ENCOUNTER — Ambulatory Visit: Payer: BLUE CROSS/BLUE SHIELD | Admitting: Rehabilitation

## 2018-08-28 ENCOUNTER — Other Ambulatory Visit: Payer: Self-pay | Admitting: Podiatry

## 2018-08-28 ENCOUNTER — Ambulatory Visit (INDEPENDENT_AMBULATORY_CARE_PROVIDER_SITE_OTHER): Payer: BLUE CROSS/BLUE SHIELD

## 2018-08-28 ENCOUNTER — Encounter: Payer: Self-pay | Admitting: Podiatry

## 2018-08-28 ENCOUNTER — Ambulatory Visit (INDEPENDENT_AMBULATORY_CARE_PROVIDER_SITE_OTHER): Payer: BLUE CROSS/BLUE SHIELD | Admitting: Podiatry

## 2018-08-28 ENCOUNTER — Ambulatory Visit: Payer: BLUE CROSS/BLUE SHIELD | Admitting: Rehabilitation

## 2018-08-28 DIAGNOSIS — L600 Ingrowing nail: Secondary | ICD-10-CM

## 2018-08-28 DIAGNOSIS — M2042 Other hammer toe(s) (acquired), left foot: Secondary | ICD-10-CM

## 2018-08-28 DIAGNOSIS — M79675 Pain in left toe(s): Secondary | ICD-10-CM

## 2018-08-28 NOTE — Therapy (Signed)
[facility] [address] [place], [place], [address] Phone: [phone]   Fax:  [phone]  Patient Details  Name: Ashley [name] MRN: [id number] Date of Birth: [birth year] Referring Provider:  Kristeen Miss, MD  Encounter Date: 08/28/2018  Pt arrived for visit this morning.  Note new injury to L 2nd metatarsal from carrying daughter over gym mats at gymnastics practice over the weekend.  She has past history of hammer toe surgery in that toe and has hardware still present.  She has increased swelling and pain and slight deformity in toe, therefore PT not willing to see until medically cleared.  She has podiatry appt today at 115 to assess and she is to call and notify us if any changes to schedule need to be made.     Cameron Sprang, PT, MPT [facility] [address] [place], [place], [address] Phone: [phone]   Fax:  [phone] 08/28/18, 9:49 AM

## 2018-08-29 NOTE — Progress Notes (Signed)
   HPI: 42 year old female presenting today with a chief complaint of pain to the left 2nd toe that began one week ago secondary to an injury. She states she hit the toe on something and was evaluated at an urgent care and at [facility]. She was told the toe was not fractured but she reports continued pain and swelling. She reports surgery on the toe in the past. There are no modifying factors noted.  She also complains of pain to the medial border of the right hallux that began a few months ago. She is concerned for an ingrown toenail. Wearing shoes and applying pressure to the toe increases the pain. She has not had any treatment for her symptoms. Patient is here for further evaluation and treatment.   Past Medical History:  Diagnosis Date  . Arthritis    knees, back  . 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 Complication of anesthesia   . Depression   . History of pneumonia   . Hypertension   . Lumbar spondylosis   . PCOS (polycystic ovarian syndrome)   . Pneumonia    2016  . PONV (postoperative nausea and vomiting)       Objective: Physical Exam General: The patient is alert and oriented x3 in no acute distress.  Dermatology:  Medial border of the right hallux appears to be erythematous with evidence of an ingrowing nail. Pain on palpation noted to the border of the nail fold. Skin is cool, dry and supple bilateral lower extremities. Negative for open lesions or macerations.  Vascular: Palpable pedal pulses bilaterally. No edema or erythema noted. Capillary refill within normal limits.  Neurological: Epicritic and protective threshold grossly intact bilaterally.   Musculoskeletal Exam: All pedal and ankle joints range of motion within normal limits bilateral. Muscle strength 5/5 in all groups bilateral. Hammertoe contracture deformity noted to the 2nd digit of the left foot  Radiographic Exam: Hammertoe contracture deformity noted to the interphalangeal joints and  MPJ of the respective hammertoe digits mentioned on clinical musculoskeletal exam.     Assessment: 1. Paronychia with ingrowing nail medial border right hallux 2. Pain in toe 3. Incurvated nail 4. Hammertoe 2nd digit left    Plan of Care:  1. Patient evaluated. X-Rays reviewed.  2. Patient going on a cruise 09/24/18. Wants to fix ingrown after.  3. Patient also wants to discuss hammertoe surgery to the left 2nd toe when she gets back.  4. Return to clinic in 4 weeks.     Edrick Kins, DPM [facility]  Dr. Edrick Kins, DPM    [address], [place] [address]                Office [phone]  Fax [phone]

## 2018-08-31 ENCOUNTER — Ambulatory Visit: Payer: BLUE CROSS/BLUE SHIELD | Admitting: Rehabilitation

## 2018-09-06 ENCOUNTER — Ambulatory Visit: Payer: BLUE CROSS/BLUE SHIELD | Admitting: Physical Therapy

## 2018-09-06 ENCOUNTER — Encounter: Payer: Self-pay | Admitting: Physical Therapy

## 2018-09-06 DIAGNOSIS — G8929 Other chronic pain: Secondary | ICD-10-CM

## 2018-09-06 DIAGNOSIS — R2681 Unsteadiness on feet: Secondary | ICD-10-CM

## 2018-09-06 DIAGNOSIS — M545 Low back pain: Secondary | ICD-10-CM

## 2018-09-06 DIAGNOSIS — M6281 Muscle weakness (generalized): Secondary | ICD-10-CM

## 2018-09-06 NOTE — Therapy (Addendum)
[facility] [address] [place], [place], [address] Phone: [phone]   Fax:  [phone]  Physical Therapy Treatment  Patient Details  Name: Ashley [name] MRN: [id number] Date of Birth: 22-Jul-1976 Referring Provider (PT): Dr. Kristeen Miss    Encounter Date: 09/06/2018  PT End of Session - 09/06/18 0939    Visit Number  14    Number of Visits  17    Date for PT Re-Evaluation  09/22/18    Authorization Type  [id number] 22 visits remaining    Authorization - Visit Number  6    Authorization - Number of Visits  22    PT Start Time  9532    PT Stop Time  1015    PT Time Calculation (min)  41 min    Activity Tolerance  Patient tolerated treatment well    Behavior During Therapy  [place] for tasks assessed/performed       Past Medical History:  Diagnosis Date  . Arthritis    knees, back  . 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 Complication of anesthesia   . Depression   . History of pneumonia   . Hypertension   . Lumbar spondylosis   . PCOS (polycystic ovarian syndrome)   . Pneumonia    2016  . PONV (postoperative nausea and vomiting)     Past Surgical History:  Procedure Laterality Date  . ABDOMINAL EXPOSURE N/A 02/17/2018   Procedure: ABDOMINAL EXPOSURE;  Surgeon: Rosetta Posner, MD;  Location: [facility] OR;  Service: Vascular;  Laterality: N/A;  . ANKLE SURGERY     x 2  . ANTERIOR LAT LUMBAR FUSION N/A 02/17/2018   Procedure: Lumbar three-four Lumbar four-five  Anterolateral lumbar interbody fusion;  Surgeon: Kristeen Miss, MD;  Location: [facility];  Service: Neurosurgery;  Laterality: N/A;  . ANTERIOR LUMBAR FUSION N/A 02/17/2018   Procedure: Lumbar five-Sacral one Anterior lumbar interbody fusion;  Surgeon: Kristeen Miss, MD;  Location: [facility];  Service: Neurosurgery;  Laterality: N/A;  . APPLICATION OF ROBOTIC ASSISTANCE FOR SPINAL PROCEDURE N/A 02/17/2018   Procedure: APPLICATION OF ROBOTIC ASSISTANCE FOR  SPINAL PROCEDURE;  Surgeon: Kristeen Miss, MD;  Location: [facility];  Service: Neurosurgery;  Laterality: N/A;  . APPLICATION OF ROBOTIC ASSISTANCE FOR SPINAL PROCEDURE N/A 06/09/2018   Procedure: APPLICATION OF ROBOTIC ASSISTANCE FOR SPINAL PROCEDURE;  Surgeon: Kristeen Miss, MD;  Location: [facility];  Service: Neurosurgery;  Laterality: N/A;  . BACK SURGERY     2017  discectomy  . CARPAL TUNNEL RELEASE Left 07/04/2014   Procedure: LEFT CARPAL TUNNEL RELEASE;  Surgeon: Ninetta Lights, MD;  Location: [facility];  Service: Orthopedics;  Laterality: Left;  . CARPAL TUNNEL RELEASE Right 08/08/2014   Procedure: RIGHT CARPAL TUNNEL RELEASE;  Surgeon: Ninetta Lights, MD;  Location: [facility];  Service: Orthopedics;  Laterality: Right;  . DILATION AND CURETTAGE OF UTERUS    . DILATION AND EVACUATION  04/02/2011   Procedure: DILATATION AND EVACUATION (D&E);  Surgeon: Luz Lex, MD;  Location: [facility];  Service: Gynecology;  Laterality: N/A;  dvt left mid thigh  . DORSAL COMPARTMENT RELEASE Left 07/04/2014   Procedure: LEFT DEQUERVAINS;  Surgeon: Ninetta Lights, MD;  Location: [facility];  Service: Orthopedics;  Laterality: Left;  . ENDOSCOPIC PLANTAR FASCIOTOMY Left 02/01/2002  . HARDWARE REMOVAL Right 06/09/2018   Procedure: Repositioning of Right Lumbar three Right lumbar  four pedicle screw with MAZOR;  Surgeon: Kristeen Miss, MD;  Location: [facility];  Service: Neurosurgery;  Laterality: Right;  . HYSTEROSCOPY W/D&C  07/17/2010   with exc. endometrial polyps  . KNEE ARTHROSCOPY Right 03/07/2003; 01/14/2005; 08/31/2006  . KNEE ARTHROSCOPY Left 03/21/2003; 11/26/2004  . LUMBAR PERCUTANEOUS PEDICLE SCREW 3 LEVEL N/A 02/17/2018   Procedure: LUMBAR PERCUTANEOUS PEDICLE SCREW PLACEMENT LUMBAR THREE-SACRAL ONE;  Surgeon: Kristeen Miss, MD;  Location: [facility];  Service: Neurosurgery;  Laterality: N/A;  . SHOULDER ARTHROSCOPY Right   . TOE SURGERY Left 01/10/2003   claw toe  correction 2nd, 3rd, 4th toes  . TOTAL HIP ARTHROPLASTY Right 04/21/2016  . TOTAL HIP ARTHROPLASTY Right 04/21/2016   Procedure: TOTAL HIP ARTHROPLASTY ANTERIOR APPROACH;  Surgeon: Ninetta Lights, MD;  Location: [facility];  Service: Orthopedics;  Laterality: Right;  . TOTAL KNEE ARTHROPLASTY Left 01/21/2010  . TOTAL KNEE ARTHROPLASTY Right 08/26/2008    There were no vitals filed for this visit.  Subjective Assessment - 09/06/18 0938    Subjective  reports no limitations other than pain limiting - has been wearing tennis shoes rather than surgical boot    Pertinent History  Past Surgical Hx: B TKA (2009, 2011) and R THA 2017. PMH includes; OA, Morbid Obesity, Depression, HTN, Lumbar Spondylosis, Polycystic Ovarian Syndrome, Pneumonia, and PONV    Patient Stated Goals  Reports goal of being more mobile with increased flexibility. Wants to "atleast put lotion on her feet."    Currently in Pain?  Yes    Pain Score  4     Pain Location  --   "I'm aching all over"   Pain Descriptors / Indicators  Aching;Tingling    Pain Type  Chronic pain                       [facility] Adult PT Treatment/Exercise - 09/06/18 0001      Exercises   Other Exercises   ladder drills to promote quick feet/light plyometrics: fwd x 2 laps, lateral x 2 laps, 2 in/2 out x 3 laps; icky shuffle x 2 laps      Knee/Hip Exercises: Standing   Step Down  Right;Hand Hold: 1;Step Height: 6";2 sets;10 reps   to AirEx - 6" step too high for appropriate quad control   Wall Squat  2 sets;10 reps   with green pball at wall   SLS with Vectors  R SLS - with L cone taps on 6" step - fwd and cross over     Other Standing Knee Exercises  R SLS with hip drop/pick up to target glute med 2 x 10 reps - heavy VC/TC for form      Knee/Hip Exercises: Prone   Other Prone Exercises  modified quadruped - R LE hip extension with target LE to floor 2 x 10 reps               PT Short Term Goals - 08/10/18 0956      PT SHORT  TERM GOAL #1   Title  Pt will initiate HEP in order to indicate improved functional mobility and decreased fall risk.  (STG DUE 11/13)    Baseline  met 08/07/18    Time  4    Period  Weeks    Status  Achieved      PT SHORT TERM GOAL #2   Title  Pt will improve gait speed to >/= 2.57 ft/sec with LRAD in order to indicate decreased fall risk  Baseline  10/14: 2.27 ft/ sec with no AD to 3.16 ft/sec without AD on 08/07/18    Time  4    Period  Weeks    Status  Achieved      PT SHORT TERM GOAL #3   Title  Pt will perform 5TSS </=17.57 secs with single UE support only in order to indicate decreased fall risk and improved functional strength.      Baseline  10/14: 22.74 from standard height chair with B UE assist to 16.38 secs without UE support.     Time  4    Period  Weeks    Status  Achieved      PT SHORT TERM GOAL #4   Title  Pt will improve BERG balance score to >/=34/56 in order to indicate decreased fall risk.     Baseline  10/14: 30/56 indicative of high fall risk potential to 51/56 on 08/07/18    Time  4    Period  Weeks    Status  Achieved      PT SHORT TERM GOAL #5   Title  Pt will negotiate up/down 8 steps with bilateral rails with supervision in order to indicate improved LE strength    Baseline  met with single rail reciprocal pattern 08/07/18    Time  4    Period  Weeks    Status  Achieved      PT SHORT TERM GOAL #6   Title  Pt will ambulate 300' on uneven surfaces, ramps, curbs, inclines, demonstrating ability to vary gait speed when cued using LRAD and therapist providing supervision to improve safety with community distances    met 08/07/18   Time  4    Period  Weeks    Status  Achieved      PT SHORT TERM GOAL #7   Title  Pt will participate in 6 min walk test to assess baseline endurance with functional mobility     Time  4    Period  Weeks    Status  Achieved        PT Long Term Goals - 09/06/18 1258      PT LONG TERM GOAL #1   Title  Pt will be  independent with final HEP in order to indicate improved functional mobilty and decreased fall risk.   (LTG DUE 09/22/18)    Time  8    Period  Weeks    Status  Revised      PT LONG TERM GOAL #2   Title  Pt will improve gait speed to >/=3.67 ft/sec w/ LRAD in order to indicate decreased fall risk and safe community ambulator.     Baseline  3.16 ft/sec on 08/07/18, updated due to being only .01 sec off from goal; 3.04 on 09/06/18 - does have some continued foot pain as likely reason    Period  Weeks    Status  Revised      PT LONG TERM GOAL #3   Title  Pt will perform 5TSS in </=13 secs without UE support in order to indicate decreased fall risk and improved functional strength.     Baseline  16.38 secs without UE support     Time  8    Period  Weeks    Status  Revised      PT LONG TERM GOAL #4   Title  Pt will improve BERG balance score to >/=38/56 in order to indicate decreased fall risk.  Baseline  met 51/56 08/07/18    Time  8    Period  Weeks    Status  Achieved      PT LONG TERM GOAL #5   Title  Pt will negotiate 8 steps with single HR and LRAD utilizing reciprocal stepping technique at Mod I level to increase safety with community accessibility.     Baseline  met 08/07/18    Time  8    Period  Weeks    Status  Achieved      PT LONG TERM GOAL #6   Title  Pt will ambulate 500 feet outdoors with LRAD navigating uneven terrain, curbs, ramps, and demonstrating ability to vary gait speed at Mod I level to improve functional mobility.     Time  8    Period  Weeks    Status  New      PT LONG TERM GOAL #7   Title  Pt will improve 6 min walk test by 300' indicating improvement with functional endurance    10/25: Patient performs 675 with rollator to 818' 08/10/18   Time  8    Period  Weeks    Status  Revised            Plan - 09/06/18 0940    Clinical Impression Statement  Introduced ladder drills today to begin light plyometric training with patient requiring  increaed time and effort for coordination and quick weight shifting. Remainder of session spent on general R LE strengthening and control with some required rest breaks due to fatigue. Making good progress towards goals. As noted in last treatment note, will extend to 09/22/18 with current goals still appropriate.     Rehab Potential  Good    Clinical Impairments Affecting Rehab Potential  Pt is motivated to improve. Strength gains may be limited 2/2 to nerve regeneration process     PT Frequency  2x / week    PT Duration  8 weeks    PT Treatment/Interventions  ADLs/Self Care Home Management;Aquatic Therapy;DME Instruction;Gait training;Stair training;Functional mobility training;Therapeutic activities;Therapeutic exercise;Balance training;Neuromuscular re-education;Patient/family education;Electrical Stimulation;Passive range of motion;Energy conservation , manual therapy, Dry Needling   PT Next Visit Plan  Jumping/plyometric type exercises? Continue Bioness to R quad during gait, stairs, ramp/curb and exercises,  Set up her NMES unit if she brings to session and give exercises to do with this on at home.  Endurance, increasing gait speed (treadmill)    PT Home Exercise Plan   [id number]     Consulted and Agree with Plan of Care  Patient       Patient will benefit from skilled therapeutic intervention in order to improve the following deficits and impairments:  Abnormal gait, Decreased activity tolerance, Decreased balance, Decreased coordination, Decreased endurance, Decreased mobility, Decreased range of motion, Decreased strength, Impaired perceived functional ability, Impaired flexibility, Impaired sensation, Pain, Decreased knowledge of use of DME, Decreased knowledge of precautions, Obesity  Visit Diagnosis: Unsteadiness on feet  Muscle weakness (generalized)  Chronic midline low back pain without sciatica     Problem List Patient Active Problem List   Diagnosis Date Noted  .  Lumbosacral radiculopathy at L4 06/09/2018  . Generalized anxiety disorder   . Pain   . Fall   . Labile blood pressure   . Neuropathic pain   . Therapeutic opioid induced constipation   . Urinary retention   . Lumbar radiculopathy 02/21/2018  . PCOS (polycystic ovarian syndrome)   . Morbid obesity ([place])   .  Depression   . Generalized OA   . Postoperative pain   . Acute blood loss anemia   . Status post surgery 02/17/2018  . Lumbar radiculopathy, chronic 02/17/2018  . Primary localized osteoarthritis of right hip 04/21/2016  . Sinusitis 06/25/2015  . Cough 06/25/2015  . Essential hypertension 04/03/2015  . Dehydration 04/03/2015  . CAP (community acquired pneumonia) 03/31/2015  . Right lower lobe pneumonia ([place]) 03/31/2015  . Fever 03/31/2015  . Pregnancy 04/29/2014  . Miscarriage   . Incomplete miscarriage 04/02/2011    Class: Acute  . Deep vein thrombosis (DVT) ([place]) 04/02/2011    Class: Chronic    Lanney Gins, PT, DPT Supplemental Physical Therapist 09/06/18 1:04 PM Pager: [phone] Office: [facility] [address] [place], [place], [address] Phone: [phone]   Fax:  [phone]  Name: Ashley [name] MRN: [id number] Date of Birth: 1975-12-21

## 2018-09-08 ENCOUNTER — Encounter: Payer: Self-pay | Admitting: Physical Therapy

## 2018-09-08 ENCOUNTER — Ambulatory Visit: Payer: BLUE CROSS/BLUE SHIELD | Admitting: Physical Therapy

## 2018-09-08 DIAGNOSIS — R2681 Unsteadiness on feet: Secondary | ICD-10-CM

## 2018-09-08 DIAGNOSIS — M545 Low back pain, unspecified: Secondary | ICD-10-CM

## 2018-09-08 DIAGNOSIS — M6281 Muscle weakness (generalized): Secondary | ICD-10-CM

## 2018-09-08 DIAGNOSIS — G8929 Other chronic pain: Secondary | ICD-10-CM

## 2018-09-08 NOTE — Therapy (Signed)
[facility] [address] [place], [place], [address] Phone: [phone]   Fax:  [phone]  Physical Therapy Treatment  Patient Details  Name: Ashley [name] MRN: [id number] Date of Birth: 04-08-1976 Referring Provider (PT): Dr. Kristeen Miss    Encounter Date: 09/08/2018  PT End of Session - 09/08/18 1130    Visit Number  15    Number of Visits  17    Date for PT Re-Evaluation  09/22/18    Authorization Type  [id number] 22 visits remaining    Authorization - Visit Number  15    Authorization - Number of Visits  22    PT Start Time  [phone]    PT Stop Time  0932    PT Time Calculation (min)  40 min    Activity Tolerance  Patient tolerated treatment well    Behavior During Therapy  [facility] for tasks assessed/performed       Past Medical History:  Diagnosis Date  . Arthritis    knees, back  . 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 Complication of anesthesia   . Depression   . History of pneumonia   . Hypertension   . Lumbar spondylosis   . PCOS (polycystic ovarian syndrome)   . Pneumonia    2016  . PONV (postoperative nausea and vomiting)     Past Surgical History:  Procedure Laterality Date  . ABDOMINAL EXPOSURE N/A 02/17/2018   Procedure: ABDOMINAL EXPOSURE;  Surgeon: Rosetta Posner, MD;  Location: [facility] OR;  Service: Vascular;  Laterality: N/A;  . ANKLE SURGERY     x 2  . ANTERIOR LAT LUMBAR FUSION N/A 02/17/2018   Procedure: Lumbar three-four Lumbar four-five  Anterolateral lumbar interbody fusion;  Surgeon: Kristeen Miss, MD;  Location: [facility];  Service: Neurosurgery;  Laterality: N/A;  . ANTERIOR LUMBAR FUSION N/A 02/17/2018   Procedure: Lumbar five-Sacral one Anterior lumbar interbody fusion;  Surgeon: Kristeen Miss, MD;  Location: [place];  Service: Neurosurgery;  Laterality: N/A;  . APPLICATION OF ROBOTIC ASSISTANCE FOR SPINAL PROCEDURE N/A 02/17/2018   Procedure: APPLICATION OF ROBOTIC ASSISTANCE FOR  SPINAL PROCEDURE;  Surgeon: Kristeen Miss, MD;  Location: [place];  Service: Neurosurgery;  Laterality: N/A;  . APPLICATION OF ROBOTIC ASSISTANCE FOR SPINAL PROCEDURE N/A 06/09/2018   Procedure: APPLICATION OF ROBOTIC ASSISTANCE FOR SPINAL PROCEDURE;  Surgeon: Kristeen Miss, MD;  Location: [place] [place];  Service: Neurosurgery;  Laterality: N/A;  . BACK SURGERY     2017  discectomy  . CARPAL TUNNEL RELEASE Left 07/04/2014   Procedure: LEFT CARPAL TUNNEL RELEASE;  Surgeon: Ninetta Lights, MD;  Location: [facility] [place];  Service: Orthopedics;  Laterality: Left;  . CARPAL TUNNEL RELEASE Right 08/08/2014   Procedure: RIGHT CARPAL TUNNEL RELEASE;  Surgeon: Ninetta Lights, MD;  Location: [facility];  Service: Orthopedics;  Laterality: Right;  . DILATION AND CURETTAGE OF UTERUS    . DILATION AND EVACUATION  04/02/2011   Procedure: DILATATION AND EVACUATION (D&E);  Surgeon: Luz Lex, MD;  Location: [facility];  Service: Gynecology;  Laterality: N/A;  dvt left mid thigh  . DORSAL COMPARTMENT RELEASE Left 07/04/2014   Procedure: LEFT DEQUERVAINS;  Surgeon: Ninetta Lights, MD;  Location: [place];  Service: Orthopedics;  Laterality: Left;  . ENDOSCOPIC PLANTAR FASCIOTOMY Left 02/01/2002  . HARDWARE REMOVAL Right 06/09/2018   Procedure: Repositioning of Right Lumbar three Right lumbar  four pedicle screw with MAZOR;  Surgeon: Kristeen Miss, MD;  Location: [facility];  Service: Neurosurgery;  Laterality: Right;  . HYSTEROSCOPY W/D&C  07/17/2010   with exc. endometrial polyps  . KNEE ARTHROSCOPY Right 03/07/2003; 01/14/2005; 08/31/2006  . KNEE ARTHROSCOPY Left 03/21/2003; 11/26/2004  . LUMBAR PERCUTANEOUS PEDICLE SCREW 3 LEVEL N/A 02/17/2018   Procedure: LUMBAR PERCUTANEOUS PEDICLE SCREW PLACEMENT LUMBAR THREE-SACRAL ONE;  Surgeon: Kristeen Miss, MD;  Location: [facility];  Service: Neurosurgery;  Laterality: N/A;  . SHOULDER ARTHROSCOPY Right   . TOE SURGERY Left 01/10/2003   claw toe  correction 2nd, 3rd, 4th toes  . TOTAL HIP ARTHROPLASTY Right 04/21/2016  . TOTAL HIP ARTHROPLASTY Right 04/21/2016   Procedure: TOTAL HIP ARTHROPLASTY ANTERIOR APPROACH;  Surgeon: Ninetta Lights, MD;  Location: [facility];  Service: Orthopedics;  Laterality: Right;  . TOTAL KNEE ARTHROPLASTY Left 01/21/2010  . TOTAL KNEE ARTHROPLASTY Right 08/26/2008    There were no vitals filed for this visit.  Subjective Assessment - 09/08/18 1129    Subjective  patient reporting general pain and tightness at B UT - was able to get socks and shoes on yesterday - son helped take off boots    Pertinent History  Past Surgical Hx: B TKA (2009, 2011) and R THA 2017. PMH includes; OA, Morbid Obesity, Depression, HTN, Lumbar Spondylosis, Polycystic Ovarian Syndrome, Pneumonia, and PONV    Patient Stated Goals  Reports goal of being more mobile with increased flexibility. Wants to "atleast put lotion on her feet."    Currently in Pain?  Yes    Pain Score  5     Pain Location  Shoulder   upper traps   Pain Orientation  Left;Right    Pain Descriptors / Indicators  Aching;Tightness    Pain Type  Chronic pain                       [facility] Adult PT Treatment/Exercise - 09/08/18 0001      Exercises   Other Exercises   ladder drills to promote quick feet/light plyometrics: fwd x 2 laps, lateral x 2 laps, 2 in/2 out x 3 laps; icky shuffle x 2 laps - patient requiring increased time to process for coordination      Manual Therapy   Manual Therapy  Soft tissue mobilization;Myofascial release    Manual therapy comments  skillful palpation thorughout TDN treatment    Soft tissue mobilization  STM to B UT, cervical paraspinals, suboccipital mm, B SCM    Myofascial Release  manual trigger point release to B UT       Trigger Point Dry Needling - 09/08/18 1142    Consent Given?  Yes    Muscles Treated Upper Body  Upper trapezius    Upper Trapezius Response  Twitch reponse elicited;Palpable increased muscle  length             PT Short Term Goals - 08/10/18 0956      PT SHORT TERM GOAL #1   Title  Pt will initiate HEP in order to indicate improved functional mobility and decreased fall risk.  (STG DUE 11/13)    Baseline  met 08/07/18    Time  4    Period  Weeks    Status  Achieved      PT SHORT TERM GOAL #2   Title  Pt will improve gait speed to >/= 2.57 ft/sec with LRAD in order to indicate decreased fall risk     Baseline  10/14: 2.27 ft/  sec with no AD to 3.16 ft/sec without AD on 08/07/18    Time  4    Period  Weeks    Status  Achieved      PT SHORT TERM GOAL #3   Title  Pt will perform 5TSS </=17.57 secs with single UE support only in order to indicate decreased fall risk and improved functional strength.      Baseline  10/14: 22.74 from standard height chair with B UE assist to 16.38 secs without UE support.     Time  4    Period  Weeks    Status  Achieved      PT SHORT TERM GOAL #4   Title  Pt will improve BERG balance score to >/=34/56 in order to indicate decreased fall risk.     Baseline  10/14: 30/56 indicative of high fall risk potential to 51/56 on 08/07/18    Time  4    Period  Weeks    Status  Achieved      PT SHORT TERM GOAL #5   Title  Pt will negotiate up/down 8 steps with bilateral rails with supervision in order to indicate improved LE strength    Baseline  met with single rail reciprocal pattern 08/07/18    Time  4    Period  Weeks    Status  Achieved      PT SHORT TERM GOAL #6   Title  Pt will ambulate 300' on uneven surfaces, ramps, curbs, inclines, demonstrating ability to vary gait speed when cued using LRAD and therapist providing supervision to improve safety with community distances    met 08/07/18   Time  4    Period  Weeks    Status  Achieved      PT SHORT TERM GOAL #7   Title  Pt will participate in 6 min walk test to assess baseline endurance with functional mobility     Time  4    Period  Weeks    Status  Achieved        PT  Long Term Goals - 09/06/18 1258      PT LONG TERM GOAL #1   Title  Pt will be independent with final HEP in order to indicate improved functional mobilty and decreased fall risk.   (LTG DUE 09/22/18)    Time  8    Period  Weeks    Status  Revised      PT LONG TERM GOAL #2   Title  Pt will improve gait speed to >/=3.67 ft/sec w/ LRAD in order to indicate decreased fall risk and safe community ambulator.     Baseline  3.16 ft/sec on 08/07/18, updated due to being only .01 sec off from goal; 3.04 on 09/06/18 - does have some continued foot pain as likely reason    Period  Weeks    Status  Revised      PT LONG TERM GOAL #3   Title  Pt will perform 5TSS in </=13 secs without UE support in order to indicate decreased fall risk and improved functional strength.     Baseline  16.38 secs without UE support     Time  8    Period  Weeks    Status  Revised      PT LONG TERM GOAL #4   Title  Pt will improve BERG balance score to >/=38/56 in order to indicate decreased fall risk.      Baseline  met  51/56 08/07/18    Time  8    Period  Weeks    Status  Achieved      PT LONG TERM GOAL #5   Title  Pt will negotiate 8 steps with single HR and LRAD utilizing reciprocal stepping technique at Mod I level to increase safety with community accessibility.     Baseline  met 08/07/18    Time  8    Period  Weeks    Status  Achieved      PT LONG TERM GOAL #6   Title  Pt will ambulate 500 feet outdoors with LRAD navigating uneven terrain, curbs, ramps, and demonstrating ability to vary gait speed at Mod I level to improve functional mobility.     Time  8    Period  Weeks    Status  New      PT LONG TERM GOAL #7   Title  Pt will improve 6 min walk test by 300' indicating improvement with functional endurance    10/25: Patient performs 675 with rollator to 818' 08/10/18   Time  8    Period  Weeks    Status  Revised            Plan - 09/08/18 1139    Clinical Impression Statement  Patient today  reporting pain and general tightness at B UT mm - noted tissue tightness upon palpation as well as palpable trigger points. Good response to manual therapy and TDN to this region. Remainder of session spent on coordination/plyometric training with ladder drills. Patient easily fatigued with this. WIll continue to beneift from skilled PT servicies.     Rehab Potential  Good    Clinical Impairments Affecting Rehab Potential  Pt is motivated to improve. Strength gains may be limited 2/2 to nerve regeneration process     PT Frequency  2x / week    PT Duration  8 weeks    PT Treatment/Interventions  ADLs/Self Care Home Management;Aquatic Therapy;DME Instruction;Gait training;Stair training;Functional mobility training;Therapeutic activities;Therapeutic exercise;Balance training;Neuromuscular re-education;Patient/family education;Electrical Stimulation;Passive range of motion;Energy conservation    PT Next Visit Plan  Jumping/plyometric type exercises? Continue Bioness to R quad during gait, stairs, ramp/curb and exercises,  Set up her NMES unit if she brings to session and give exercises to do with this on at home.  Endurance, increasing gait speed (treadmill)    PT Home Exercise Plan   [id number]     Consulted and Agree with Plan of Care  Patient       Patient will benefit from skilled therapeutic intervention in order to improve the following deficits and impairments:  Abnormal gait, Decreased activity tolerance, Decreased balance, Decreased coordination, Decreased endurance, Decreased mobility, Decreased range of motion, Decreased strength, Impaired perceived functional ability, Impaired flexibility, Impaired sensation, Pain, Decreased knowledge of use of DME, Decreased knowledge of precautions, Obesity  Visit Diagnosis: Unsteadiness on feet  Muscle weakness (generalized)  Chronic midline low back pain without sciatica     Problem List Patient Active Problem List   Diagnosis Date Noted  .  Lumbosacral radiculopathy at L4 06/09/2018  . Generalized anxiety disorder   . Pain   . Fall   . Labile blood pressure   . Neuropathic pain   . Therapeutic opioid induced constipation   . Urinary retention   . Lumbar radiculopathy 02/21/2018  . PCOS (polycystic ovarian syndrome)   . Morbid obesity ([place])   . Depression   . Generalized OA   . Postoperative pain   .  Acute blood loss anemia   . Status post surgery 02/17/2018  . Lumbar radiculopathy, chronic 02/17/2018  . Primary localized osteoarthritis of right hip 04/21/2016  . Sinusitis 06/25/2015  . Cough 06/25/2015  . Essential hypertension 04/03/2015  . Dehydration 04/03/2015  . CAP (community acquired pneumonia) 03/31/2015  . Right lower lobe pneumonia ([place]) 03/31/2015  . Fever 03/31/2015  . Pregnancy 04/29/2014  . Miscarriage   . Incomplete miscarriage 04/02/2011    Class: Acute  . Deep vein thrombosis (DVT) ([place]) 04/02/2011    Class: Chronic     Lanney Gins, PT, DPT Supplemental Physical Therapist 09/08/18 11:44 AM Pager: [phone] Office: [facility] [address] [place], [place], [address] Phone: [phone]   Fax:  [phone]  Name: Ashley [name] MRN: [id number] Date of Birth: 04/28/1976

## 2018-09-11 ENCOUNTER — Other Ambulatory Visit: Payer: Self-pay | Admitting: Physical Medicine & Rehabilitation

## 2018-09-19 ENCOUNTER — Ambulatory Visit: Payer: BLUE CROSS/BLUE SHIELD | Admitting: Physical Therapy

## 2018-09-21 ENCOUNTER — Encounter: Payer: Medicare Other | Admitting: Physical Medicine & Rehabilitation

## 2018-09-22 ENCOUNTER — Ambulatory Visit: Payer: Medicare Other | Attending: Neurological Surgery | Admitting: Rehabilitation

## 2018-09-22 ENCOUNTER — Encounter: Payer: Self-pay | Admitting: Rehabilitation

## 2018-09-22 DIAGNOSIS — R2681 Unsteadiness on feet: Secondary | ICD-10-CM | POA: Insufficient documentation

## 2018-09-22 DIAGNOSIS — M545 Low back pain: Secondary | ICD-10-CM | POA: Insufficient documentation

## 2018-09-22 DIAGNOSIS — G8929 Other chronic pain: Secondary | ICD-10-CM | POA: Diagnosis present

## 2018-09-22 DIAGNOSIS — M6281 Muscle weakness (generalized): Secondary | ICD-10-CM | POA: Diagnosis present

## 2018-09-22 NOTE — Therapy (Signed)
[facility] [address] [place], [place], [address] Phone: [phone]   Fax:  [phone]  Physical Therapy Treatment  Patient Details  Name: Ashley [name] MRN: [id number] Date of Birth: 1976/01/31 Referring Provider (PT): Dr. Kristeen Miss    Encounter Date: 09/22/2018  PT End of Session - 09/22/18 0933    Visit Number  16    Number of Visits  17    Date for PT Re-Evaluation  09/22/18    Authorization Type  [employer]-Has 22 visits remaining    Authorization - Visit Number  13    Authorization - Number of Visits  22    PT Start Time  0851    PT Stop Time  0931    PT Time Calculation (min)  40 min    Activity Tolerance  Patient tolerated treatment well    Behavior During Therapy  [facility] for tasks assessed/performed       Past Medical History:  Diagnosis Date  . Arthritis    knees, back  . 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 Complication of anesthesia   . Depression   . History of pneumonia   . Hypertension   . Lumbar spondylosis   . PCOS (polycystic ovarian syndrome)   . Pneumonia    2016  . PONV (postoperative nausea and vomiting)     Past Surgical History:  Procedure Laterality Date  . ABDOMINAL EXPOSURE N/A 02/17/2018   Procedure: ABDOMINAL EXPOSURE;  Surgeon: Rosetta Posner, MD;  Location: [facility];  Service: Vascular;  Laterality: N/A;  . ANKLE SURGERY     x 2  . ANTERIOR LAT LUMBAR FUSION N/A 02/17/2018   Procedure: Lumbar three-four Lumbar four-five  Anterolateral lumbar interbody fusion;  Surgeon: Kristeen Miss, MD;  Location: [facility];  Service: Neurosurgery;  Laterality: N/A;  . ANTERIOR LUMBAR FUSION N/A 02/17/2018   Procedure: Lumbar five-Sacral one Anterior lumbar interbody fusion;  Surgeon: Kristeen Miss, MD;  Location: [facility];  Service: Neurosurgery;  Laterality: N/A;  . APPLICATION OF ROBOTIC ASSISTANCE FOR SPINAL PROCEDURE N/A 02/17/2018   Procedure: APPLICATION OF ROBOTIC ASSISTANCE FOR  SPINAL PROCEDURE;  Surgeon: Kristeen Miss, MD;  Location: [facility];  Service: Neurosurgery;  Laterality: N/A;  . APPLICATION OF ROBOTIC ASSISTANCE FOR SPINAL PROCEDURE N/A 06/09/2018   Procedure: APPLICATION OF ROBOTIC ASSISTANCE FOR SPINAL PROCEDURE;  Surgeon: Kristeen Miss, MD;  Location: [facility];  Service: Neurosurgery;  Laterality: N/A;  . BACK SURGERY     2017  discectomy  . CARPAL TUNNEL RELEASE Left 07/04/2014   Procedure: LEFT CARPAL TUNNEL RELEASE;  Surgeon: Ninetta Lights, MD;  Location: [facility];  Service: Orthopedics;  Laterality: Left;  . CARPAL TUNNEL RELEASE Right 08/08/2014   Procedure: RIGHT CARPAL TUNNEL RELEASE;  Surgeon: Ninetta Lights, MD;  Location: [facility];  Service: Orthopedics;  Laterality: Right;  . DILATION AND CURETTAGE OF UTERUS    . DILATION AND EVACUATION  04/02/2011   Procedure: DILATATION AND EVACUATION (D&E);  Surgeon: Luz Lex, MD;  Location: [facility];  Service: Gynecology;  Laterality: N/A;  dvt left mid thigh  . DORSAL COMPARTMENT RELEASE Left 07/04/2014   Procedure: LEFT DEQUERVAINS;  Surgeon: Ninetta Lights, MD;  Location: [facility];  Service: Orthopedics;  Laterality: Left;  . ENDOSCOPIC PLANTAR FASCIOTOMY Left 02/01/2002  . HARDWARE REMOVAL Right 06/09/2018   Procedure: Repositioning of Right Lumbar three Right lumbar  four pedicle screw with MAZOR;  Surgeon: Kristeen Miss, MD;  Location: [facility];  Service: Neurosurgery;  Laterality: Right;  . HYSTEROSCOPY W/D&C  07/17/2010   with exc. endometrial polyps  . KNEE ARTHROSCOPY Right 03/07/2003; 01/14/2005; 08/31/2006  . KNEE ARTHROSCOPY Left 03/21/2003; 11/26/2004  . LUMBAR PERCUTANEOUS PEDICLE SCREW 3 LEVEL N/A 02/17/2018   Procedure: LUMBAR PERCUTANEOUS PEDICLE SCREW PLACEMENT LUMBAR THREE-SACRAL ONE;  Surgeon: Kristeen Miss, MD;  Location: [facility];  Service: Neurosurgery;  Laterality: N/A;  . SHOULDER ARTHROSCOPY Right   . TOE SURGERY Left 01/10/2003   claw toe  correction 2nd, 3rd, 4th toes  . TOTAL HIP ARTHROPLASTY Right 04/21/2016  . TOTAL HIP ARTHROPLASTY Right 04/21/2016   Procedure: TOTAL HIP ARTHROPLASTY ANTERIOR APPROACH;  Surgeon: Ninetta Lights, MD;  Location: [facility];  Service: Orthopedics;  Laterality: Right;  . TOTAL KNEE ARTHROPLASTY Left 01/21/2010  . TOTAL KNEE ARTHROPLASTY Right 08/26/2008    There were no vitals filed for this visit.  Subjective Assessment - 09/22/18 0855    Subjective  My back and my shoulders have been very painful lately.     Pertinent History  Past Surgical Hx: B TKA (2009, 2011) and R THA 2017. PMH includes; OA, Morbid Obesity, Depression, HTN, Lumbar Spondylosis, Polycystic Ovarian Syndrome, Pneumonia, and PONV    Limitations  Standing;Walking;Lifting;Sitting    How long can you sit comfortably?  Sitting is uncomfortable. No amount of time specified.     How long can you stand comfortably?  about 5-10 mins    How long can you walk comfortably?  Short period of time with no time period specified    Patient Stated Goals  Reports goal of being more mobile with increased flexibility. Wants to "atleast put lotion on her feet."    Currently in Pain?  Yes    Pain Score  3     Pain Location  Shoulder    Pain Orientation  Right;Left;Medial   shoulders and upper back (scapular)    Pain Descriptors / Indicators  Aching;Tightness    Pain Type  Chronic pain    Pain Onset  More than a month ago    Pain Frequency  Constant    Aggravating Factors   walking    Pain Relieving Factors  not walking                       Access Code: [email or web address]  URL: [email or web address]  Date: 09/22/2018  Prepared by: Cameron Sprang   Exercises  Seated Scapular Retraction - 10 reps - 2 sets - 2-3 hold - 1x daily - 7x weekly  Hip Flexor Stretch at Edge of Bed - 3 sets - 60 hold - 1x daily - 7x weekly  Sidelying ITB Stretch off Table - 3 reps - 1 sets - 30-60 hold - 1x daily - 7x weekly  Sidestepping in Squat  with Resistance and Arms Forward - 4 reps - 1 sets - 1x daily - 7x weekly  Upper Trapezius Stretch - 10 reps - 3 sets - 1x daily - 7x weekly  Single Leg Bridge - 5 reps - 2 sets - 1x daily - 7x weekly  Isometric Gluteus Medius at Wall - 10 reps - 2 sets - 1x daily - 7x weekly  Seated Hamstring Stretch with Strap - 3 reps - 30 seconds hold - 2x daily - 7x weekly  Supine Piriformis Stretch with Leg Straight - 3 reps - 1 sets - 45 hold - 1x daily - 7x weekly  Wall  Quarter Squat - 5 reps - 2 sets - 5 hold - 1x daily - 7x weekly  [facility] Adult PT Treatment/Exercise - 09/22/18 0900      Transfers   Transfers  Sit to Stand;Stand to Sit    Sit to Stand  6: Modified independent (Device/Increase time)    Five time sit to stand comments   13.34 secs without UE support    Stand to Sit  6: Modified independent (Device/Increase time)      Ambulation/Gait   Ambulation/Gait  Yes    Ambulation/Gait Assistance  6: Modified independent (Device/Increase time);5: Supervision    Ambulation/Gait Assistance Details  Pt able to ambulate outdoors x 500' without AD at mod I level (tranversed over paved, grass, uphill/downhill, and up/down curb step) without any evidence of LOB.      Ambulation Distance (Feet)  500 Feet    Assistive device  None    Gait Pattern  Step-through pattern    Ambulation Surface  Level;Unlevel;Indoor;Outdoor;Paved;Grass    Gait velocity  3.78 ft/sec without AD               PT Short Term Goals - 08/10/18 0956      PT SHORT TERM GOAL #1   Title  Pt will initiate HEP in order to indicate improved functional mobility and decreased fall risk.  (STG DUE 11/13)    Baseline  met 08/07/18    Time  4    Period  Weeks    Status  Achieved      PT SHORT TERM GOAL #2   Title  Pt will improve gait speed to >/= 2.57 ft/sec with LRAD in order to indicate decreased fall risk     Baseline  10/14: 2.27 ft/ sec with no AD to 3.16 ft/sec without AD on 08/07/18    Time  4    Period  Weeks     Status  Achieved      PT SHORT TERM GOAL #3   Title  Pt will perform 5TSS </=17.57 secs with single UE support only in order to indicate decreased fall risk and improved functional strength.      Baseline  10/14: 22.74 from standard height chair with B UE assist to 16.38 secs without UE support.     Time  4    Period  Weeks    Status  Achieved      PT SHORT TERM GOAL #4   Title  Pt will improve BERG balance score to >/=34/56 in order to indicate decreased fall risk.     Baseline  10/14: 30/56 indicative of high fall risk potential to 51/56 on 08/07/18    Time  4    Period  Weeks    Status  Achieved      PT SHORT TERM GOAL #5   Title  Pt will negotiate up/down 8 steps with bilateral rails with supervision in order to indicate improved LE strength    Baseline  met with single rail reciprocal pattern 08/07/18    Time  4    Period  Weeks    Status  Achieved      PT SHORT TERM GOAL #6   Title  Pt will ambulate 300' on uneven surfaces, ramps, curbs, inclines, demonstrating ability to vary gait speed when cued using LRAD and therapist providing supervision to improve safety with community distances    met 08/07/18   Time  4    Period  Weeks    Status  Achieved      PT SHORT TERM GOAL #7   Title  Pt will participate in 6 min walk test to assess baseline endurance with functional mobility     Time  4    Period  Weeks    Status  Achieved        PT Long Term Goals - 09/22/18 0859      PT LONG TERM GOAL #1   Title  Pt will be independent with final HEP in order to indicate improved functional mobilty and decreased fall risk.   (LTG DUE 09/22/18)    Baseline  met per verbal report    Time  8    Period  Weeks    Status  Achieved      PT LONG TERM GOAL #2   Title  Pt will improve gait speed to >/=3.67 ft/sec w/ LRAD in order to indicate decreased fall risk and safe community ambulator.     Baseline  3.78 ft/sec without AD on 09/22/18    Period  Weeks    Status  Achieved      PT  LONG TERM GOAL #3   Title  Pt will perform 5TSS in </=13 secs without UE support in order to indicate decreased fall risk and improved functional strength.     Baseline  16.38 secs without UE support to 13.34 secs without UE support on 09/22/18    Time  8    Period  Weeks    Status  Partially Met      PT LONG TERM GOAL #4   Title  Pt will improve BERG balance score to >/=38/56 in order to indicate decreased fall risk.      Baseline  met 51/56 08/07/18    Time  8    Period  Weeks    Status  Achieved      PT LONG TERM GOAL #5   Title  Pt will negotiate 8 steps with single HR and LRAD utilizing reciprocal stepping technique at Mod I level to increase safety with community accessibility.     Baseline  met 08/07/18    Time  8    Period  Weeks    Status  Achieved      PT LONG TERM GOAL #6   Title  Pt will ambulate 500 feet outdoors with LRAD navigating uneven terrain, curbs, ramps, and demonstrating ability to vary gait speed at Mod I level to improve functional mobility.     Baseline  met 09/22/18    Time  8    Period  Weeks    Status  Achieved      PT LONG TERM GOAL #7   Title  Pt will improve 6 min walk test by 300' indicating improvement with functional endurance    10/25: Patient performs 675 with rollator to 818' 08/10/18   Time  8    Period  Weeks    Status  Revised            Plan - 09/22/18 0933    Clinical Impression Statement  Skilled session began to look at [facility].  Pt has met 5/7 LTGs (did not have time to check 6MWT) and partially met 5TSS goal.  Pt making good progress but still limited by decreased strength in RLE as well high level balance deficits.  Pt to be gone on cruise next week, but will plan to recert the following week for 2x/wk for 4 more weeks.  Pt verbalized understanding.  Rehab Potential  Good    Clinical Impairments Affecting Rehab Potential  Pt is motivated to improve. Strength gains may be limited 2/2 to nerve regeneration process     PT  Frequency  2x / week    PT Duration  8 weeks    PT Treatment/Interventions  ADLs/Self Care Home Management;Aquatic Therapy;DME Instruction;Gait training;Stair training;Functional mobility training;Therapeutic activities;Therapeutic exercise;Balance training;Neuromuscular re-education;Patient/family education;Electrical Stimulation;Passive range of motion;Energy conservation    PT Next Visit Plan  check 6MWT-adjust goals and recert for 4 more weeks, Jumping/plyometric type exercises? Continue Bioness to R quad during gait, stairs, ramp/curb and exercises,  Set up her NMES unit if she brings to session and give exercises to do with this on at home.  Endurance, increasing gait speed (treadmill)    PT Home Exercise Plan   [email or web address]     Consulted and Agree with Plan of Care  Patient       Patient will benefit from skilled therapeutic intervention in order to improve the following deficits and impairments:  Abnormal gait, Decreased activity tolerance, Decreased balance, Decreased coordination, Decreased endurance, Decreased mobility, Decreased range of motion, Decreased strength, Impaired perceived functional ability, Impaired flexibility, Impaired sensation, Pain, Decreased knowledge of use of DME, Decreased knowledge of precautions, Obesity  Visit Diagnosis: Unsteadiness on feet  Muscle weakness (generalized)  Chronic midline low back pain without sciatica     Problem List Patient Active Problem List   Diagnosis Date Noted  . Lumbosacral radiculopathy at L4 06/09/2018  . Generalized anxiety disorder   . Pain   . Fall   . Labile blood pressure   . Neuropathic pain   . Therapeutic opioid induced constipation   . Urinary retention   . Lumbar radiculopathy 02/21/2018  . PCOS (polycystic ovarian syndrome)   . Morbid obesity ([place])   . Depression   . Generalized OA   . Postoperative pain   . Acute blood loss anemia   . Status post surgery 02/17/2018  . Lumbar radiculopathy, chronic  02/17/2018  . Primary localized osteoarthritis of right hip 04/21/2016  . Sinusitis 06/25/2015  . Cough 06/25/2015  . Essential hypertension 04/03/2015  . Dehydration 04/03/2015  . CAP (community acquired pneumonia) 03/31/2015  . Right lower lobe pneumonia ([place]) 03/31/2015  . Fever 03/31/2015  . Pregnancy 04/29/2014  . Miscarriage   . Incomplete miscarriage 04/02/2011    Class: Acute  . 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

## 2018-09-22 NOTE — Patient Instructions (Signed)
Access Code: [email or web address]  URL: [email or web address]  Date: 09/22/2018  Prepared by: Cameron Sprang   Exercises  Seated Scapular Retraction - 10 reps - 2 sets - 2-3 hold - 1x daily - 7x weekly  Hip Flexor Stretch at Edge of Bed - 3 sets - 60 hold - 1x daily - 7x weekly  Sidelying ITB Stretch off Table - 3 reps - 1 sets - 30-60 hold - 1x daily - 7x weekly  Sidestepping in Squat with Resistance and Arms Forward - 4 reps - 1 sets - 1x daily - 7x weekly  Upper Trapezius Stretch - 10 reps - 3 sets - 1x daily - 7x weekly  Single Leg Bridge - 5 reps - 2 sets - 1x daily - 7x weekly  Isometric Gluteus Medius at Wall - 10 reps - 2 sets - 1x daily - 7x weekly  Seated Hamstring Stretch with Strap - 3 reps - 30 seconds hold - 2x daily - 7x weekly  Supine Piriformis Stretch with Leg Straight - 3 reps - 1 sets - 45 hold - 1x daily - 7x weekly  Wall Quarter Squat - 5 reps - 2 sets - 5 hold - 1x daily - 7x weekly

## 2018-10-03 ENCOUNTER — Encounter: Payer: Self-pay | Admitting: Rehabilitation

## 2018-10-03 ENCOUNTER — Ambulatory Visit: Payer: Medicare Other | Admitting: Rehabilitation

## 2018-10-03 DIAGNOSIS — M545 Low back pain: Secondary | ICD-10-CM

## 2018-10-03 DIAGNOSIS — G8929 Other chronic pain: Secondary | ICD-10-CM

## 2018-10-03 DIAGNOSIS — R2681 Unsteadiness on feet: Secondary | ICD-10-CM

## 2018-10-03 DIAGNOSIS — M6281 Muscle weakness (generalized): Secondary | ICD-10-CM

## 2018-10-03 NOTE — Therapy (Signed)
[facility] [address] [place], [place], [address] Phone: [phone]   Fax:  [phone]  Physical Therapy Treatment  Patient Details  Name: Ashley [name] MRN: [id number] Date of Birth: 09-May-1976 Referring Provider (PT): Dr. Kristeen Miss    Encounter Date: 10/03/2018  PT End of Session - 10/03/18 1123    Visit Number  17    Number of Visits  25   Per updated POC   Date for PT Re-Evaluation  11/02/18   per updated POC   Authorization Type  [facility] 10th visit progress note    PT Start Time  0848    PT Stop Time  0930    PT Time Calculation (min)  42 min    Activity Tolerance  Patient tolerated treatment well    Behavior During Therapy  [facility] for tasks assessed/performed       Past Medical History:  Diagnosis Date  . Arthritis    knees, back  . 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 Complication of anesthesia   . Depression   . History of pneumonia   . Hypertension   . Lumbar spondylosis   . PCOS (polycystic ovarian syndrome)   . Pneumonia    2016  . PONV (postoperative nausea and vomiting)     Past Surgical History:  Procedure Laterality Date  . ABDOMINAL EXPOSURE N/A 02/17/2018   Procedure: ABDOMINAL EXPOSURE;  Surgeon: Rosetta Posner, MD;  Location: [facility] OR;  Service: Vascular;  Laterality: N/A;  . ANKLE SURGERY     x 2  . ANTERIOR LAT LUMBAR FUSION N/A 02/17/2018   Procedure: Lumbar three-four Lumbar four-five  Anterolateral lumbar interbody fusion;  Surgeon: Kristeen Miss, MD;  Location: [facility];  Service: Neurosurgery;  Laterality: N/A;  . ANTERIOR LUMBAR FUSION N/A 02/17/2018   Procedure: Lumbar five-Sacral one Anterior lumbar interbody fusion;  Surgeon: Kristeen Miss, MD;  Location: [facility];  Service: Neurosurgery;  Laterality: N/A;  . APPLICATION OF ROBOTIC ASSISTANCE FOR SPINAL PROCEDURE N/A 02/17/2018   Procedure: APPLICATION OF ROBOTIC ASSISTANCE FOR SPINAL PROCEDURE;  Surgeon:  Kristeen Miss, MD;  Location: [facility];  Service: Neurosurgery;  Laterality: N/A;  . APPLICATION OF ROBOTIC ASSISTANCE FOR SPINAL PROCEDURE N/A 06/09/2018   Procedure: APPLICATION OF ROBOTIC ASSISTANCE FOR SPINAL PROCEDURE;  Surgeon: Kristeen Miss, MD;  Location: [facility];  Service: Neurosurgery;  Laterality: N/A;  . BACK SURGERY     2017  discectomy  . CARPAL TUNNEL RELEASE Left 07/04/2014   Procedure: LEFT CARPAL TUNNEL RELEASE;  Surgeon: Ninetta Lights, MD;  Location: [facility];  Service: Orthopedics;  Laterality: Left;  . CARPAL TUNNEL RELEASE Right 08/08/2014   Procedure: RIGHT CARPAL TUNNEL RELEASE;  Surgeon: Ninetta Lights, MD;  Location: [facility];  Service: Orthopedics;  Laterality: Right;  . DILATION AND CURETTAGE OF UTERUS    . DILATION AND EVACUATION  04/02/2011   Procedure: DILATATION AND EVACUATION (D&E);  Surgeon: Luz Lex, MD;  Location: [facility];  Service: Gynecology;  Laterality: N/A;  dvt left mid thigh  . DORSAL COMPARTMENT RELEASE Left 07/04/2014   Procedure: LEFT DEQUERVAINS;  Surgeon: Ninetta Lights, MD;  Location: [facility];  Service: Orthopedics;  Laterality: Left;  . ENDOSCOPIC PLANTAR FASCIOTOMY Left 02/01/2002  . HARDWARE REMOVAL Right 06/09/2018   Procedure: Repositioning of Right Lumbar three Right lumbar  four pedicle screw with MAZOR;  Surgeon: Kristeen Miss, MD;  Location: [facility];  Service: Neurosurgery;  Laterality: Right;  . HYSTEROSCOPY W/D&C  07/17/2010   with exc. endometrial polyps  . KNEE ARTHROSCOPY Right 03/07/2003; 01/14/2005; 08/31/2006  . KNEE ARTHROSCOPY Left 03/21/2003; 11/26/2004  . LUMBAR PERCUTANEOUS PEDICLE SCREW 3 LEVEL N/A 02/17/2018   Procedure: LUMBAR PERCUTANEOUS PEDICLE SCREW PLACEMENT LUMBAR THREE-SACRAL ONE;  Surgeon: Kristeen Miss, MD;  Location: [facility];  Service: Neurosurgery;  Laterality: N/A;  . SHOULDER ARTHROSCOPY Right   . TOE SURGERY Left 01/10/2003   claw toe correction 2nd, 3rd, 4th toes  .  TOTAL HIP ARTHROPLASTY Right 04/21/2016  . TOTAL HIP ARTHROPLASTY Right 04/21/2016   Procedure: TOTAL HIP ARTHROPLASTY ANTERIOR APPROACH;  Surgeon: Ninetta Lights, MD;  Location: [facility];  Service: Orthopedics;  Laterality: Right;  . TOTAL KNEE ARTHROPLASTY Left 01/21/2010  . TOTAL KNEE ARTHROPLASTY Right 08/26/2008    There were no vitals filed for this visit.  Subjective Assessment - 10/03/18 0852    Subjective  Reports had a good time on cruise, did lots of walking.  Is sore, but okay.     Pertinent History  Past Surgical Hx: B TKA (2009, 2011) and R THA 2017. PMH includes; OA, Morbid Obesity, Depression, HTN, Lumbar Spondylosis, Polycystic Ovarian Syndrome, Pneumonia, and PONV    Limitations  Standing;Walking;Lifting;Sitting    How long can you sit comfortably?  Sitting is uncomfortable. No amount of time specified.     How long can you stand comfortably?  about 5-10 mins    How long can you walk comfortably?  Short period of time with no time period specified    Patient Stated Goals  Reports goal of being more mobile with increased flexibility. Wants to "atleast put lotion on her feet."    Currently in Pain?  Yes    Pain Score  3     Pain Location  Back    Pain Orientation  Right    Pain Descriptors / Indicators  Aching    Pain Type  Chronic pain    Pain Radiating Towards  down into R hip/thigh    Pain Onset  More than a month ago    Pain Frequency  Constant    Aggravating Factors   walking    Pain Relieving Factors  not walking         [facility] PT Assessment - 10/03/18 0858      6 Minute Walk- Baseline   6 Minute Walk- Baseline  yes    BP (mmHg)  (!) 142/98    HR (bpm)  88    02 Sat (%RA)  98 %    Modified Borg Scale for Dyspnea  0- Nothing at all    Perceived Rate of Exertion (Borg)  6-      6 Minute walk- Post Test   6 Minute Walk Post Test  yes    BP (mmHg)  (!) 148/98    HR (bpm)  85    02 Sat (%RA)  98 %    Modified Borg Scale for Dyspnea  2- Mild shortness of breath     Perceived Rate of Exertion (Borg)  13- Somewhat hard      6 minute walk test results    Aerobic Endurance Distance Walked  1131    Endurance additional comments  without rollator, no rest breaks.                    [facility] Adult PT Treatment/Exercise - 10/03/18 0900      Self-Care  Self-Care  Other Self-Care Comments    Other Self-Care Comments   Discussed having her R hip dry needled in the future as it is still very tight.  Will try and get her on other PTs schedule to address this as able.  Pt verbalized understanding.       Exercises   Exercises  Other Exercises    Other Exercises   Utilized small pool noodle placed perpendicularly to upper thoracic spine, extending over noodle with B hands behind head to increase pectoral stretch.  Had pt hold x 30 secs.  Educated to get pool noodle to start as much larger would be too intense at this time.  Also demonstrated and performed prone large pool foam roll along B quads (emphasis on upper quads over R rectus and scar region) x 6 reps.  Tolerated well and recommend larger pool noodle for this exericse if she can find one, otherwise, use smaller pool noodle.  Performed seated marching (seated PWR! step) x 6 reps total to address weak R hip flexion.  Pt tearful due to frustration on not being able to complete without self assisting.  Continue to encourage her to perform active assist as needed as this is still strengthening.  Pt verbalized understanding.              PT Education - 10/03/18 1122    Education Details  see self care, progress based on 6MWT, foam roll exercises (did not formally add to HEP with pictures, provided verbally and had her return demo).     Person(s) Educated  Patient    Methods  Explanation;Demonstration;Verbal cues    Comprehension  Verbalized understanding;Returned demonstration       PT Short Term Goals - 08/10/18 0956      PT SHORT TERM GOAL #1   Title  Pt will initiate HEP in order to  indicate improved functional mobility and decreased fall risk.  (STG DUE 11/13)    Baseline  met 08/07/18    Time  4    Period  Weeks    Status  Achieved      PT SHORT TERM GOAL #2   Title  Pt will improve gait speed to >/= 2.57 ft/sec with LRAD in order to indicate decreased fall risk     Baseline  10/14: 2.27 ft/ sec with no AD to 3.16 ft/sec without AD on 08/07/18    Time  4    Period  Weeks    Status  Achieved      PT SHORT TERM GOAL #3   Title  Pt will perform 5TSS </=17.57 secs with single UE support only in order to indicate decreased fall risk and improved functional strength.      Baseline  10/14: 22.74 from standard height chair with B UE assist to 16.38 secs without UE support.     Time  4    Period  Weeks    Status  Achieved      PT SHORT TERM GOAL #4   Title  Pt will improve BERG balance score to >/=34/56 in order to indicate decreased fall risk.     Baseline  10/14: 30/56 indicative of high fall risk potential to 51/56 on 08/07/18    Time  4    Period  Weeks    Status  Achieved      PT SHORT TERM GOAL #5   Title  Pt will negotiate up/down 8 steps with bilateral rails with supervision in order to indicate  improved LE strength    Baseline  met with single rail reciprocal pattern 08/07/18    Time  4    Period  Weeks    Status  Achieved      PT SHORT TERM GOAL #6   Title  Pt will ambulate 300' on uneven surfaces, ramps, curbs, inclines, demonstrating ability to vary gait speed when cued using LRAD and therapist providing supervision to improve safety with community distances    met 08/07/18   Time  4    Period  Weeks    Status  Achieved      PT SHORT TERM GOAL #7   Title  Pt will participate in 6 min walk test to assess baseline endurance with functional mobility     Time  4    Period  Weeks    Status  Achieved        PT Long Term Goals - 10/03/18 0912      PT LONG TERM GOAL #1   Title  Pt will be independent with final HEP in order to indicate improved  functional mobilty and decreased fall risk.   (LTG DUE 09/22/18)    Baseline  met per verbal report    Time  8    Period  Weeks    Status  Achieved      PT LONG TERM GOAL #2   Title  Pt will improve gait speed to >/=3.67 ft/sec w/ LRAD in order to indicate decreased fall risk and safe community ambulator.     Baseline  3.78 ft/sec without AD on 09/22/18    Period  Weeks    Status  Achieved      PT LONG TERM GOAL #3   Title  Pt will perform 5TSS in </=13 secs without UE support in order to indicate decreased fall risk and improved functional strength.     Baseline  16.38 secs without UE support to 13.34 secs without UE support on 09/22/18    Time  8    Period  Weeks    Status  Partially Met      PT LONG TERM GOAL #4   Title  Pt will improve BERG balance score to >/=38/56 in order to indicate decreased fall risk.      Baseline  met 51/56 08/07/18    Time  8    Period  Weeks    Status  Achieved      PT LONG TERM GOAL #5   Title  Pt will negotiate 8 steps with single HR and LRAD utilizing reciprocal stepping technique at Mod I level to increase safety with community accessibility.     Baseline  met 08/07/18    Time  8    Period  Weeks    Status  Achieved      PT LONG TERM GOAL #6   Title  Pt will ambulate 500 feet outdoors with LRAD navigating uneven terrain, curbs, ramps, and demonstrating ability to vary gait speed at Mod I level to improve functional mobility.     Baseline  met 09/22/18    Time  8    Period  Weeks    Status  Achieved      PT LONG TERM GOAL #7   Title  Pt will improve 6 min walk test by 300' indicating improvement with functional endurance    10/25: Patient performs 675 with rollator to 818' 08/10/18   Baseline  818 to 1131' on 10/03/18    Time  8    Period  Weeks    Status  Achieved         PT Long Term Goals - 10/03/18 0912      PT LONG TERM GOAL #1   Title  Pt will be independent with final HEP in order to indicate improved functional mobilty and  decreased fall risk.   (LTG DUE 11/02/18)    Baseline  met per verbal report, will make ongoing to include new/updated exercises    Time  4    Period  Weeks    Status  On-going      PT LONG TERM GOAL #2   Title  Pt will improve gait speed to >/=4.10 ft/sec w/ LRAD in order to indicate decreased fall risk and safe community ambulator.     Baseline  3.78 ft/sec on 09/22/18    Time  4    Period  Weeks    Status  Revised      PT LONG TERM GOAL #3   Title  Pt will perform 5TSS in </=13 secs without UE support in order to indicate decreased fall risk and improved functional strength.     Baseline  16.38 secs without UE support to 13.34 secs without UE support on 09/22/18    Time  8    Period  Weeks    Status  On-going      PT LONG TERM GOAL #4   Title  Will assess FGA and improve score by 3 points in order to indicate decreased fall risk.     Baseline  --    Time  4    Period  Weeks    Status  New      PT LONG TERM GOAL #5   Title  Pt will negotiate 4 steps without HR utilizing reciprocal stepping technique at Mod I level to increase safety with community accessibility.     Baseline  --    Time  4    Period  Weeks    Status  Revised      PT LONG TERM GOAL #6   Title  Pt will ambulate x 500' outdoors/indoors without AD with less antaglic gait pattern in order to indicate improved fluidity of gait and decreased pain.     Baseline  --    Time  4    Period  Weeks    Status  New      PT LONG TERM GOAL #7   Title  Pt will improve 6 min walk test by 150' indicating improvement with functional endurance    10/25: Patient performs 675 with rollator to 818' 08/10/18   Baseline  1131' on 10/03/18    Time  4    Period  Weeks    Status  Revised           Plan - 10/03/18 1124    Clinical Impression Statement  Skilled session addressed LTG for 6MWT with pt demonstrating a 300' gain from last assessment and was able to complete without rollator.  Do note increase in antalgic gait pattern,  esp with increased distance.  Also note continued R hip tightness and weak hip flexors, limiting ability to complete ADLs.  Will plan to continue with pt 2x/wk for 4 more weeks to address remaining deficits.      Rehab Potential  Good    Clinical Impairments Affecting Rehab Potential  Pt is motivated to improve. Strength gains may be limited 2/2 to nerve regeneration process  PT Frequency  2x / week    PT Duration  4 weeks    PT Treatment/Interventions  ADLs/Self Care Home Management;Aquatic Therapy;DME Instruction;Gait training;Stair training;Functional mobility training;Therapeutic activities;Therapeutic exercise;Balance training;Neuromuscular re-education;Patient/family education;Electrical Stimulation;Passive range of motion;Energy conservation    PT Next Visit Plan  Dry needling to R thigh as able, Jumping/plyometric type exercises, R hip flexion Continue Bioness to R quad during gait, stairs, ramp/curb and exercises,  Set up her NMES unit if she brings to session and give exercises to do with this on at home.  Endurance, increasing gait speed (treadmill)    PT Home Exercise Plan   [id number]     Consulted and Agree with Plan of Care  Patient       Patient will benefit from skilled therapeutic intervention in order to improve the following deficits and impairments:  Abnormal gait, Decreased activity tolerance, Decreased balance, Decreased coordination, Decreased endurance, Decreased mobility, Decreased range of motion, Decreased strength, Impaired perceived functional ability, Impaired flexibility, Impaired sensation, Pain, Decreased knowledge of use of DME, Decreased knowledge of precautions, Obesity  Visit Diagnosis: Unsteadiness on feet  Muscle weakness (generalized)  Chronic midline low back pain without sciatica     Problem List Patient Active Problem List   Diagnosis Date Noted  . Lumbosacral radiculopathy at L4 06/09/2018  . Generalized anxiety disorder   . Pain   . Fall    . Labile blood pressure   . Neuropathic pain   . Therapeutic opioid induced constipation   . Urinary retention   . Lumbar radiculopathy 02/21/2018  . PCOS (polycystic ovarian syndrome)   . Morbid obesity ([place])   . Depression   . Generalized OA   . Postoperative pain   . Acute blood loss anemia   . Status post surgery 02/17/2018  . Lumbar radiculopathy, chronic 02/17/2018  . Primary localized osteoarthritis of right hip 04/21/2016  . Sinusitis 06/25/2015  . Cough 06/25/2015  . Essential hypertension 04/03/2015  . Dehydration 04/03/2015  . CAP (community acquired pneumonia) 03/31/2015  . Right lower lobe pneumonia ([place]) 03/31/2015  . Fever 03/31/2015  . Pregnancy 04/29/2014  . Miscarriage   . Incomplete miscarriage 04/02/2011    Class: Acute  . 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 [place], [place], [address] Phone: [phone]   Fax:  [phone] 10/03/18, 11:27 AM  Name: Ashley [name] MRN: [id number] Date of Birth: 1976-05-26

## 2018-10-04 ENCOUNTER — Encounter: Payer: Self-pay | Admitting: Physical Medicine & Rehabilitation

## 2018-10-04 ENCOUNTER — Encounter: Payer: Medicare Other | Attending: Physical Medicine & Rehabilitation | Admitting: Physical Medicine & Rehabilitation

## 2018-10-04 VITALS — BP 127/87 | HR 85 | Ht 71.5 in | Wt 284.0 lb

## 2018-10-04 DIAGNOSIS — R269 Unspecified abnormalities of gait and mobility: Secondary | ICD-10-CM | POA: Diagnosis present

## 2018-10-04 DIAGNOSIS — F411 Generalized anxiety disorder: Secondary | ICD-10-CM | POA: Insufficient documentation

## 2018-10-04 DIAGNOSIS — M792 Neuralgia and neuritis, unspecified: Secondary | ICD-10-CM | POA: Insufficient documentation

## 2018-10-04 DIAGNOSIS — G8918 Other acute postprocedural pain: Secondary | ICD-10-CM | POA: Insufficient documentation

## 2018-10-04 DIAGNOSIS — M5416 Radiculopathy, lumbar region: Secondary | ICD-10-CM | POA: Insufficient documentation

## 2018-10-04 NOTE — Progress Notes (Addendum)
Subjective:    Patient ID: Ashley [name], female    DOB: 1976-01-29, 43 y.o.   MRN: [id number]  HPI 43 year old right-handed female with history of hypertension, morbid obesity, bilateral TKA and chronic radicular back pain presents for follow up for  lumbar spondylosis with radiculopathy s/pdecompression L3-4 and L4-5 with anterior exposure of L5-S1 as well as percutaneous fixation L3 to the sacrum 02/17/2018 and resulting right L3 radiculopathy s/p hardware replacement and revision.  Last clinic visit 07/19/18.  Since that time, pt states is still in therapies.  She is following up with Neurosurg. She continues to take Lyrica, Baclofen, Lidoderm patch. Using TENS. She has not lost weight, just started taking medication.  She is more mobile now, went on a cruise last week. Denies falls.   Pain Inventory Average Pain 4 Pain Right Now 4 My pain is constant, sharp, tingling and aching  In the last 24 hours, has pain interfered with the following? General activity 5 Relation with others 8 Enjoyment of life 8 What TIME of day is your pain at its worst? all Sleep (in general) Poor  Pain is worse with: bending, inactivity and some activites Pain improves with: heat/ice, medication and TENS Relief from Meds: 6  Mobility walk without assistance ability to climb steps?  yes do you drive?  yes  Function disabled: date disabled .  Neuro/Psych weakness numbness tingling spasms  Prior Studies Any changes since last visit?  no  Physicians involved in your care Any changes since last visit?  no   Family History  Problem Relation Age of Onset  . Cancer Maternal Grandmother        UTERINE  . Cancer Paternal Grandmother        breast  . Breast cancer Paternal Grandmother   . Diabetes Father   . Cancer Father        COLON  . Heart disease Father   . Kidney disease Father        dialysis  . Diabetes Sister   . Breast cancer Cousin    Social History   Socioeconomic  History  . Marital status: Legally Separated    Spouse name: Not on file  . Number of children: Not on file  . Years of education: Not on file  . Highest education level: Not on file  Occupational History  . Not on file  Social Needs  . Financial resource strain: Not on file  . Food insecurity:    Worry: Not on file    Inability: Not on file  . Transportation needs:    Medical: Not on file    Non-medical: Not on file  Tobacco Use  . Smoking status: Never Smoker  . Smokeless tobacco: Never Used  Substance and Sexual Activity  . Alcohol use: No    Alcohol/week: 0.0 standard drinks  . Drug use: No  . Sexual activity: Not on file  Lifestyle  . Physical activity:    Days per week: Not on file    Minutes per session: Not on file  . Stress: Not on file  Relationships  . Social connections:    Talks on phone: Not on file    Gets together: Not on file    Attends religious service: Not on file    Active member of club or organization: Not on file    Attends meetings of clubs or organizations: Not on file    Relationship status: Not on file  Other Topics Concern  . Not  on file  Social History Narrative  . Not on file   Past Surgical History:  Procedure Laterality Date  . ABDOMINAL EXPOSURE N/A 02/17/2018   Procedure: ABDOMINAL EXPOSURE;  Surgeon: Rosetta Posner, MD;  Location: [facility];  Service: Vascular;  Laterality: N/A;  . ANKLE SURGERY     x 2  . ANTERIOR LAT LUMBAR FUSION N/A 02/17/2018   Procedure: Lumbar three-four Lumbar four-five  Anterolateral lumbar interbody fusion;  Surgeon: Kristeen Miss, MD;  Location: [place];  Service: Neurosurgery;  Laterality: N/A;  . ANTERIOR LUMBAR FUSION N/A 02/17/2018   Procedure: Lumbar five-Sacral one Anterior lumbar interbody fusion;  Surgeon: Kristeen Miss, MD;  Location: [place];  Service: Neurosurgery;  Laterality: N/A;  . APPLICATION OF ROBOTIC ASSISTANCE FOR SPINAL PROCEDURE N/A 02/17/2018   Procedure: APPLICATION OF ROBOTIC ASSISTANCE  FOR SPINAL PROCEDURE;  Surgeon: Kristeen Miss, MD;  Location: [place];  Service: Neurosurgery;  Laterality: N/A;  . APPLICATION OF ROBOTIC ASSISTANCE FOR SPINAL PROCEDURE N/A 06/09/2018   Procedure: APPLICATION OF ROBOTIC ASSISTANCE FOR SPINAL PROCEDURE;  Surgeon: Kristeen Miss, MD;  Location: [place];  Service: Neurosurgery;  Laterality: N/A;  . BACK SURGERY     2017  discectomy  . CARPAL TUNNEL RELEASE Left 07/04/2014   Procedure: LEFT CARPAL TUNNEL RELEASE;  Surgeon: Ninetta Lights, MD;  Location: [place];  Service: Orthopedics;  Laterality: Left;  . CARPAL TUNNEL RELEASE Right 08/08/2014   Procedure: RIGHT CARPAL TUNNEL RELEASE;  Surgeon: Ninetta Lights, MD;  Location: [place];  Service: Orthopedics;  Laterality: Right;  . DILATION AND CURETTAGE OF UTERUS    . DILATION AND EVACUATION  04/02/2011   Procedure: DILATATION AND EVACUATION (D&E);  Surgeon: [name] Lex, MD;  Location: [facility];  Service: Gynecology;  Laterality: N/A;  dvt left mid thigh  . DORSAL COMPARTMENT RELEASE Left 07/04/2014   Procedure: LEFT DEQUERVAINS;  Surgeon: Ninetta Lights, MD;  Location: [place];  Service: Orthopedics;  Laterality: Left;  . ENDOSCOPIC PLANTAR FASCIOTOMY Left 02/01/2002  . HARDWARE REMOVAL Right 06/09/2018   Procedure: Repositioning of Right Lumbar three Right lumbar  four pedicle screw with MAZOR;  Surgeon: Kristeen Miss, MD;  Location: [place];  Service: Neurosurgery;  Laterality: Right;  . HYSTEROSCOPY W/D&C  07/17/2010   with exc. endometrial polyps  . KNEE ARTHROSCOPY Right 03/07/2003; 01/14/2005; 08/31/2006  . KNEE ARTHROSCOPY Left 03/21/2003; 11/26/2004  . LUMBAR PERCUTANEOUS PEDICLE SCREW 3 LEVEL N/A 02/17/2018   Procedure: LUMBAR PERCUTANEOUS PEDICLE SCREW PLACEMENT LUMBAR THREE-SACRAL ONE;  Surgeon: Kristeen Miss, MD;  Location: [place];  Service: Neurosurgery;  Laterality: N/A;  . SHOULDER ARTHROSCOPY Right   . TOE SURGERY Left 01/10/2003   claw toe  correction 2nd, 3rd, 4th toes  . TOTAL HIP ARTHROPLASTY Right 04/21/2016  . TOTAL HIP ARTHROPLASTY Right 04/21/2016   Procedure: TOTAL HIP ARTHROPLASTY ANTERIOR APPROACH;  Surgeon: Ninetta Lights, MD;  Location: [place];  Service: Orthopedics;  Laterality: Right;  . TOTAL KNEE ARTHROPLASTY Left 01/21/2010  . TOTAL KNEE ARTHROPLASTY Right 08/26/2008   Past Medical History:  Diagnosis Date  . Arthritis    knees, back  . 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 Complication of anesthesia   . Depression   . History of pneumonia   . Hypertension   . Lumbar spondylosis   . PCOS (polycystic ovarian syndrome)   . Pneumonia    2016  . PONV (postoperative nausea and vomiting)  BP 127/87   Pulse 85   Ht 5' 11.5" (1.816 m)   Wt 284 lb (128.8 kg)   SpO2 97%   BMI 39.06 kg/m   Opioid Risk Score:   Fall Risk Score:  `1  Depression screen PHQ 2/9  Depression screen [facility] 2/9 07/19/2018 07/13/2016 11/30/2015 08/17/2015 06/25/2015 06/01/2015 04/10/2015  Decreased Interest 1 2 0 0 0 0 0  Down, Depressed, Hopeless 1 3 0 0 0 0 0  PHQ - 2 Score 2 5 0 0 0 0 0  Some recent data might be hidden    Review of Systems  Constitutional: Negative.   HENT: Negative.   Eyes: Negative.   Respiratory: Negative.   Cardiovascular: Negative.   Gastrointestinal: Negative.   Endocrine: Negative.   Genitourinary: Negative.   Musculoskeletal: Positive for arthralgias and back pain.       Spasms   Skin: Negative.   Allergic/Immunologic: Negative.   Neurological: Positive for weakness and numbness.       Tingling   Hematological: Negative.   Psychiatric/Behavioral: Negative.   All other systems reviewed and are negative.      Objective:   Physical Exam Constitutional: No distress . Obese HENT: Normocephalic, atraumatic Eyes: EOMI, No discharge Cardiovascular: RRR. No JVD  Respiratory: CTA bilaterally. Normal effort  GI: BS +, non-distended Musc: no edema, no tenderness Gait: Right  limp Neurological: She isalert.  LLE: 5/5 proximal to distal RLE: HF 2+/5, KE 4-/5, ADF 4+/5  Sensation diminished to light touch right lateral thigh.  Skin:warm and dry. Psychiatric: Flat    Assessment & Plan:  43 year old right-handed female with history of hypertension, morbid obesity, bilateral TKA and chronic radicular back pain presents for follow up for  lumbar spondylosis with radiculopathy s/p decompression L3-4 and L4-5 with anterior exposure of L5-S1 as well as percutaneous fixation L3 to the sacrum 02/17/2018 and resulting right L3 radiculopathy s/p hardware replacement and revision.     1. Decreased functional mobility secondary to lumbar spondylosis with radiculopathy.S/P decompression L3-4 and L4-5 with anterior exposure of L5-S1 as well as percutaneous fixation L3 to the sacrum 02/17/2018 s/p revision and hardware replacement              Cont therapies             Cont follow up with Neurosurg   2. Pain Management:              Unable to tolerate Gabapentin at high doses             Cont Lyrica 75 TID.             Cont Baclofen 30 mg BID             Cont Lidoderm patch             Con TENS IT             Cont heating pad for limited time (only using occasionally)             Cont follow up Psychology - [facility]   3. Mood             Cont follow up with Psychiatry   4. Morbid obesity.              States she knows what she needs to do and has lost weight in the past             She is going  to start appetite suppressant, picked up prescription yesterday, states she only started recently, encouraged again   5. Gait abnormality             No assistive device at present             Cont therapies

## 2018-10-05 ENCOUNTER — Encounter: Payer: Self-pay | Admitting: Rehabilitation

## 2018-10-05 ENCOUNTER — Ambulatory Visit: Payer: Medicare Other | Admitting: Rehabilitation

## 2018-10-05 DIAGNOSIS — R2681 Unsteadiness on feet: Secondary | ICD-10-CM | POA: Diagnosis not present

## 2018-10-05 DIAGNOSIS — M545 Low back pain, unspecified: Secondary | ICD-10-CM

## 2018-10-05 DIAGNOSIS — G8929 Other chronic pain: Secondary | ICD-10-CM

## 2018-10-05 DIAGNOSIS — M6281 Muscle weakness (generalized): Secondary | ICD-10-CM

## 2018-10-05 NOTE — Therapy (Signed)
[place], [place], [address] Phone: [phone]   Fax:  [phone]  Physical Therapy Treatment  Patient Details  Name: CEOLA PARA MRN: [id number] Date of Birth: 12/18/1975 Referring Provider (PT): Dr. Kristeen Miss    Encounter Date: 10/05/2018  PT End of Session - 10/05/18 0939    Visit Number  18    Number of Visits  25   Per updated POC   Date for PT Re-Evaluation  11/02/18   per updated POC   Authorization Type  [facility] 10th visit progress note    PT Start Time  0933    PT Stop Time  1015    PT Time Calculation (min)  42 min    Activity Tolerance  Patient tolerated treatment well    Behavior During Therapy  [facility] for tasks assessed/performed       Past Medical History:  Diagnosis Date  . Arthritis    knees, back  . 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 Complication of anesthesia   . Depression   . History of pneumonia   . Hypertension   . Lumbar spondylosis   . PCOS (polycystic ovarian syndrome)   . Pneumonia    2016  . PONV (postoperative nausea and vomiting)     Past Surgical History:  Procedure Laterality Date  . ABDOMINAL EXPOSURE N/A 02/17/2018   Procedure: ABDOMINAL EXPOSURE;  Surgeon: Rosetta Posner, MD;  Location: [facility];  Service: Vascular;  Laterality: N/A;  . ANKLE SURGERY     x 2  . ANTERIOR LAT LUMBAR FUSION N/A 02/17/2018   Procedure: Lumbar three-four Lumbar four-five  Anterolateral lumbar interbody fusion;  Surgeon: Kristeen Miss, MD;  Location: [place];  Service: Neurosurgery;  Laterality: N/A;  . ANTERIOR LUMBAR FUSION N/A 02/17/2018   Procedure: Lumbar five-Sacral one Anterior lumbar interbody fusion;  Surgeon: Kristeen Miss, MD;  Location: [place];  Service: Neurosurgery;  Laterality: N/A;  . APPLICATION OF ROBOTIC ASSISTANCE FOR SPINAL PROCEDURE N/A 02/17/2018   Procedure: APPLICATION OF ROBOTIC ASSISTANCE FOR SPINAL PROCEDURE;  Surgeon:  Kristeen Miss, MD;  Location: [place];  Service: Neurosurgery;  Laterality: N/A;  . APPLICATION OF ROBOTIC ASSISTANCE FOR SPINAL PROCEDURE N/A 06/09/2018   Procedure: APPLICATION OF ROBOTIC ASSISTANCE FOR SPINAL PROCEDURE;  Surgeon: Kristeen Miss, MD;  Location: [place];  Service: Neurosurgery;  Laterality: N/A;  . BACK SURGERY     2017  discectomy  . CARPAL TUNNEL RELEASE Left 07/04/2014   Procedure: LEFT CARPAL TUNNEL RELEASE;  Surgeon: Ninetta Lights, MD;  Location: [place];  Service: Orthopedics;  Laterality: Left;  . CARPAL TUNNEL RELEASE Right 08/08/2014   Procedure: RIGHT CARPAL TUNNEL RELEASE;  Surgeon: Ninetta Lights, MD;  Location: [place];  Service: Orthopedics;  Laterality: Right;  . DILATION AND CURETTAGE OF UTERUS    . DILATION AND EVACUATION  04/02/2011   Procedure: DILATATION AND EVACUATION (D&E);  Surgeon: Luz Lex, MD;  Location: [facility];  Service: Gynecology;  Laterality: N/A;  dvt left mid thigh  . DORSAL COMPARTMENT RELEASE Left 07/04/2014   Procedure: LEFT DEQUERVAINS;  Surgeon: Ninetta Lights, MD;  Location: [place];  Service: Orthopedics;  Laterality: Left;  . ENDOSCOPIC PLANTAR FASCIOTOMY Left 02/01/2002  . HARDWARE REMOVAL Right 06/09/2018   Procedure: Repositioning of Right Lumbar three Right lumbar  four pedicle screw with MAZOR;  Surgeon: Kristeen Miss, MD;  Location: [place];  Service: Neurosurgery;  Laterality: Right;  . HYSTEROSCOPY W/D&C  07/17/2010   with exc. endometrial polyps  . KNEE ARTHROSCOPY Right 03/07/2003; 01/14/2005; 08/31/2006  . KNEE ARTHROSCOPY Left 03/21/2003; 11/26/2004  . LUMBAR PERCUTANEOUS PEDICLE SCREW 3 LEVEL N/A 02/17/2018   Procedure: LUMBAR PERCUTANEOUS PEDICLE SCREW PLACEMENT LUMBAR THREE-SACRAL ONE;  Surgeon: Kristeen Miss, MD;  Location: [place];  Service: Neurosurgery;  Laterality: N/A;  . SHOULDER ARTHROSCOPY Right   . TOE SURGERY Left 01/10/2003   claw toe correction 2nd, 3rd, 4th toes  .  TOTAL HIP ARTHROPLASTY Right 04/21/2016  . TOTAL HIP ARTHROPLASTY Right 04/21/2016   Procedure: TOTAL HIP ARTHROPLASTY ANTERIOR APPROACH;  Surgeon: Ninetta Lights, MD;  Location: [place];  Service: Orthopedics;  Laterality: Right;  . TOTAL KNEE ARTHROPLASTY Left 01/21/2010  . TOTAL KNEE ARTHROPLASTY Right 08/26/2008    There were no vitals filed for this visit.  Subjective Assessment - 10/05/18 0937    Subjective  I saw Dr. Posey Pronto yesterday and he wants me to lose weight.     Pertinent History  Past Surgical Hx: B TKA (2009, 2011) and R THA 2017. PMH includes; OA, Morbid Obesity, Depression, HTN, Lumbar Spondylosis, Polycystic Ovarian Syndrome, Pneumonia, and PONV    Limitations  Standing;Walking;Lifting;Sitting    How long can you sit comfortably?  Sitting is uncomfortable. No amount of time specified.     How long can you stand comfortably?  about 5-10 mins    How long can you walk comfortably?  Short period of time with no time period specified    Patient Stated Goals  Reports goal of being more mobile with increased flexibility. Wants to "atleast put lotion on her feet."    Currently in Pain?  Yes    Pain Score  4     Pain Location  Hip    Pain Orientation  Right    Pain Descriptors / Indicators  Aching    Pain Type  Chronic pain    Pain Onset  More than a month ago    Pain Frequency  Constant    Aggravating Factors   walking    Pain Relieving Factors  not walking                        [facility] Adult PT Treatment/Exercise - 10/05/18 0952      Exercises   Exercises  Other Exercises    Other Exercises   Agility ladder plyometric exercises for improved quad control.  Quick lateral stepping into each block x 2 laps, modified forward jump (one foot quickly followed by other foot) x 2 laps, forward jumping with BLEs at same time x 2 laps,  jumping forward BLEs alternating jumping into abd x 2 laps, alternating lunge jumps x 2 sets of 10 reps.  Lateral hop steps over small towel  roll x 2 sets of 10 reps with emphasis on quick and light movements.  Core strengthening: quadruped alternating LE extension x 10 reps, plank on elbows x 5 reps with 5 sec hold.  Prone R quad/hip flex stretch with PT providing assist for knee flexion.  Pt very tight in R hip, therefore added this to HEP but will formally do with pt at next visit with her yoga strap.  Also note that she continues to have cramping in R quad during hamstring stretch, therefore had her perform seated on EOM with one leg on mat.  This seemed to provide adequate stretch and allow her to  relax quad, therefore changed on HEP.               PT Education - 10/05/18 1524    Education Details  changes to HEP stretches, Educated pt on consistency of diet/exercise progam.      Person(s) Educated  Patient    Methods  Explanation;Demonstration;Handout    Comprehension  Verbalized understanding;Returned demonstration       PT Short Term Goals - 08/10/18 0956      PT SHORT TERM GOAL #1   Title  Pt will initiate HEP in order to indicate improved functional mobility and decreased fall risk.  (STG DUE 11/13)    Baseline  met 08/07/18    Time  4    Period  Weeks    Status  Achieved      PT SHORT TERM GOAL #2   Title  Pt will improve gait speed to >/= 2.57 ft/sec with LRAD in order to indicate decreased fall risk     Baseline  10/14: 2.27 ft/ sec with no AD to 3.16 ft/sec without AD on 08/07/18    Time  4    Period  Weeks    Status  Achieved      PT SHORT TERM GOAL #3   Title  Pt will perform 5TSS </=17.57 secs with single UE support only in order to indicate decreased fall risk and improved functional strength.      Baseline  10/14: 22.74 from standard height chair with B UE assist to 16.38 secs without UE support.     Time  4    Period  Weeks    Status  Achieved      PT SHORT TERM GOAL #4   Title  Pt will improve BERG balance score to >/=34/56 in order to indicate decreased fall risk.     Baseline  10/14: 30/56  indicative of high fall risk potential to 51/56 on 08/07/18    Time  4    Period  Weeks    Status  Achieved      PT SHORT TERM GOAL #5   Title  Pt will negotiate up/down 8 steps with bilateral rails with supervision in order to indicate improved LE strength    Baseline  met with single rail reciprocal pattern 08/07/18    Time  4    Period  Weeks    Status  Achieved      PT SHORT TERM GOAL #6   Title  Pt will ambulate 300' on uneven surfaces, ramps, curbs, inclines, demonstrating ability to vary gait speed when cued using LRAD and therapist providing supervision to improve safety with community distances    met 08/07/18   Time  4    Period  Weeks    Status  Achieved      PT SHORT TERM GOAL #7   Title  Pt will participate in 6 min walk test to assess baseline endurance with functional mobility     Time  4    Period  Weeks    Status  Achieved        PT Long Term Goals - 10/03/18 0912      PT LONG TERM GOAL #1   Title  Pt will be independent with final HEP in order to indicate improved functional mobilty and decreased fall risk.   (LTG DUE 11/02/18)    Baseline  met per verbal report, will make ongoing to include new/updated exercises    Time  4  Period  Weeks    Status  On-going      PT LONG TERM GOAL #2   Title  Pt will improve gait speed to >/=4.10 ft/sec w/ LRAD in order to indicate decreased fall risk and safe community ambulator.     Baseline  3.78 ft/sec on 09/22/18    Time  4    Period  Weeks    Status  Revised      PT LONG TERM GOAL #3   Title  Pt will perform 5TSS in </=13 secs without UE support in order to indicate decreased fall risk and improved functional strength.     Baseline  16.38 secs without UE support to 13.34 secs without UE support on 09/22/18    Time  8    Period  Weeks    Status  On-going      PT LONG TERM GOAL #4   Title  Will assess FGA and improve score by 3 points in order to indicate decreased fall risk.     Baseline  --    Time  4     Period  Weeks    Status  New      PT LONG TERM GOAL #5   Title  Pt will negotiate 4 steps without HR utilizing reciprocal stepping technique at Mod I level to increase safety with community accessibility.     Baseline  --    Time  4    Period  Weeks    Status  Revised      PT LONG TERM GOAL #6   Title  Pt will ambulate x 500' outdoors/indoors without AD with less antaglic gait pattern in order to indicate improved fluidity of gait and decreased pain.     Baseline  --    Time  4    Period  Weeks    Status  New      PT LONG TERM GOAL #7   Title  Pt will improve 6 min walk test by 150' indicating improvement with functional endurance    10/25: Patient performs 675 with rollator to 818' 08/10/18   Baseline  1131' on 10/03/18    Time  4    Period  Weeks    Status  Revised            Plan - 10/05/18 1524    Clinical Impression Statement  Skilled session focused on high level strengthening/agility with use of agility ladder with plyometric type movements with emphasis on quad control.  Also continue to address core strength and updated two stretches on HEP.  Pt reports that she is trying to do better with diet, however then states she is going to [facility] following this session.  Provided education on consistency of diet and making efforts at every meal to make smarter choices.  Pt verbalized understanding.     Rehab Potential  Good    Clinical Impairments Affecting Rehab Potential  Pt is motivated to improve. Strength gains may be limited 2/2 to nerve regeneration process     PT Frequency  2x / week    PT Duration  4 weeks    PT Treatment/Interventions  ADLs/Self Care Home Management;Aquatic Therapy;DME Instruction;Gait training;Stair training;Functional mobility training;Therapeutic activities;Therapeutic exercise;Balance training;Neuromuscular re-education;Patient/family education;Electrical Stimulation;Passive range of motion;Energy conservation    PT Next Visit Plan  Dry  needling to R thigh as able, Jumping/plyometric type exercises, R hip flexion Continue Bioness to R quad during gait, stairs, ramp/curb and exercises,  Set  up her NMES unit if she brings to session and give exercises to do with this on at home.  Endurance, increasing gait speed (treadmill)    PT Home Exercise Plan   [id number]     Consulted and Agree with Plan of Care  Patient       Patient will benefit from skilled therapeutic intervention in order to improve the following deficits and impairments:  Abnormal gait, Decreased activity tolerance, Decreased balance, Decreased coordination, Decreased endurance, Decreased mobility, Decreased range of motion, Decreased strength, Impaired perceived functional ability, Impaired flexibility, Impaired sensation, Pain, Decreased knowledge of use of DME, Decreased knowledge of precautions, Obesity  Visit Diagnosis: Unsteadiness on feet  Muscle weakness (generalized)  Chronic midline low back pain without sciatica     Problem List Patient Active Problem List   Diagnosis Date Noted  . Lumbosacral radiculopathy at L4 06/09/2018  . Generalized anxiety disorder   . Pain   . Fall   . Labile blood pressure   . Neuropathic pain   . Therapeutic opioid induced constipation   . Urinary retention   . Lumbar radiculopathy 02/21/2018  . PCOS (polycystic ovarian syndrome)   . Morbid obesity ([place])   . Depression   . Generalized OA   . Postoperative pain   . Acute blood loss anemia   . Status post surgery 02/17/2018  . Lumbar radiculopathy, chronic 02/17/2018  . Primary localized osteoarthritis of right hip 04/21/2016  . Sinusitis 06/25/2015  . Cough 06/25/2015  . Essential hypertension 04/03/2015  . Dehydration 04/03/2015  . CAP (community acquired pneumonia) 03/31/2015  . Right lower lobe pneumonia ([place]) 03/31/2015  . Fever 03/31/2015  . Pregnancy 04/29/2014  . Miscarriage   . Incomplete miscarriage 04/02/2011    Class: Acute  . 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

## 2018-10-05 NOTE — Patient Instructions (Signed)
Access Code: [email or web address]  URL: [email or web address]  Date: 10/05/2018  Prepared by: Cameron Sprang   Exercises  Seated Scapular Retraction - 10 reps - 2 sets - 2-3 hold - 1x daily - 7x weekly  Hip Flexor Stretch at Edge of Bed - 3 sets - 60 hold - 1x daily - 7x weekly  Sidelying ITB Stretch off Table - 3 reps - 1 sets - 30-60 hold - 1x daily - 7x weekly  Sidestepping in Squat with Resistance and Arms Forward - 4 reps - 1 sets - 1x daily - 7x weekly  Upper Trapezius Stretch - 10 reps - 3 sets - 1x daily - 7x weekly  Single Leg Bridge - 5 reps - 2 sets - 1x daily - 7x weekly  Isometric Gluteus Medius at Wall - 10 reps - 2 sets - 1x daily - 7x weekly  Supine Piriformis Stretch with Leg Straight - 3 reps - 1 sets - 45 hold - 1x daily - 7x weekly  Wall Quarter Squat - 5 reps - 2 sets - 5 hold - 1x daily - 7x weekly  Seated Table Hamstring Stretch - 3 reps - 1 sets - 45 hold - 1x daily - 7x weekly  Prone Quadriceps Stretch with Strap - 3 reps - 1 sets - 30 hold - 1x daily - 7x weekly

## 2018-10-09 ENCOUNTER — Ambulatory Visit (INDEPENDENT_AMBULATORY_CARE_PROVIDER_SITE_OTHER): Payer: Medicare Other

## 2018-10-09 ENCOUNTER — Ambulatory Visit (INDEPENDENT_AMBULATORY_CARE_PROVIDER_SITE_OTHER): Payer: Medicare Other | Admitting: Podiatry

## 2018-10-09 DIAGNOSIS — M2041 Other hammer toe(s) (acquired), right foot: Secondary | ICD-10-CM

## 2018-10-09 DIAGNOSIS — L600 Ingrowing nail: Secondary | ICD-10-CM | POA: Diagnosis not present

## 2018-10-09 DIAGNOSIS — B351 Tinea unguium: Secondary | ICD-10-CM | POA: Diagnosis not present

## 2018-10-09 MED ORDER — GENTAMICIN SULFATE 0.1 % EX CREA: 1.0000 "application " | TOPICAL_CREAM | Freq: Two times a day (BID) | CUTANEOUS | 0 refills | Status: DC

## 2018-10-09 MED ORDER — TERBINAFINE HCL 250 MG PO TABS: 250.0000 mg | ORAL_TABLET | Freq: Every day | ORAL | 0 refills | Status: DC

## 2018-10-09 NOTE — Patient Instructions (Signed)
Soak Instructions    THE DAY AFTER THE PROCEDURE  Place 1/4 cup of epsom salts in a quart of warm tap water.  Submerge your foot or feet with outer bandage intact for the initial soak; this will allow the bandage to become moist and wet for easy lift off.  Once you remove your bandage, continue to soak in the solution for 20 minutes.  This soak should be done twice a day.  Next, remove your foot or feet from solution, blot dry the affected area and cover.  You may use a band aid large enough to cover the area or use gauze and tape.  Apply other medications to the area as directed by the doctor such as polysporin neosporin.  IF YOUR SKIN BECOMES IRRITATED WHILE USING THESE INSTRUCTIONS, IT IS OKAY TO SWITCH TO  WHITE VINEGAR AND WATER. Or you may use antibacterial soap and water to keep the toe clean  Monitor for any signs/symptoms of infection. Call the office immediately if any occur or go directly to the emergency room. Call with any questions/concerns.    [facility] Instructions-Post Nail Surgery  You have had your ingrown toenail and root treated with a chemical.  This chemical causes a burn that will drain and ooze like a blister.  This can drain for 6-8 weeks or longer.  It is important to keep this area clean, covered, and follow the soaking instructions dispensed at the time of your surgery.  This area will eventually dry and form a scab.  Once the scab forms you no longer need to soak or apply a dressing.  If at any time you experience an increase in pain, redness, swelling, or drainage, you should contact the office as soon as possible.    Pre-Operative Instructions  Congratulations, you have decided to take an important step towards improving your quality of life.  You can be assured that the doctors and staff at [facility] will be with you every step of the way.  Here are some important things you should know:  1. Plan to be at the surgery center/hospital at  least 1 (one) hour prior to your scheduled time, unless otherwise directed by the surgical center/hospital staff.  You must have a responsible adult accompany you, remain during the surgery and drive you home.  Make sure you have directions to the surgical center/hospital to ensure you arrive on time. 2. If you are having surgery at [facility] or [facility], you will need a copy of your medical history and physical form from your family physician within one month prior to the date of surgery. We will give you a form for your primary physician to complete.  3. We make every effort to accommodate the date you request for surgery.  However, there are times where surgery dates or times have to be moved.  We will contact you as soon as possible if a change in schedule is required.   4. No aspirin/ibuprofen for one week before surgery.  If you are on aspirin, any non-steroidal anti-inflammatory medications (Mobic, Aleve, Ibuprofen) should not be taken seven (7) days prior to your surgery.  You make take Tylenol for pain prior to surgery.  5. Medications - If you are taking daily heart and blood pressure medications, seizure, reflux, allergy, asthma, anxiety, pain or diabetes medications, make sure you notify the surgery center/hospital before the day of surgery so they can tell you which medications you should take or avoid  the day of surgery. 6. No food or drink after midnight the night before surgery unless directed otherwise by surgical center/hospital staff. 7. No alcoholic beverages [id number] prior to surgery.  No smoking 24-hours prior or 24-hours after surgery. 8. Wear loose pants or shorts. They should be loose enough to fit over bandages, boots, and casts. 9. Don't wear slip-on shoes. Sneakers are preferred. 10. Bring your boot with you to the surgery center/hospital.  Also bring crutches or a walker if your physician has prescribed it for you.  If you do not have this equipment, it will be provided for  you after surgery. 11. If you have not been contacted by the surgery center/hospital by the day before your surgery, call to confirm the date and time of your surgery. 12. Leave-time from work may vary depending on the type of surgery you have.  Appropriate arrangements should be made prior to surgery with your employer. 13. Prescriptions will be provided immediately following surgery by your doctor.  Fill these as soon as possible after surgery and take the medication as directed. Pain medications will not be refilled on weekends and must be approved by the doctor. 14. Remove nail polish on the operative foot and avoid getting pedicures prior to surgery. 15. Wash the night before surgery.  The night before surgery wash the foot and leg well with water and the antibacterial soap provided. Be sure to pay special attention to beneath the toenails and in between the toes.  Wash for at least three (3) minutes. Rinse thoroughly with water and dry well with a towel.  Perform this wash unless told not to do so by your physician.  Enclosed: 1 Ice pack (please put in freezer the night before surgery)   1 Hibiclens skin cleaner   Pre-op instructions  If you have any questions regarding the instructions, please do not hesitate to call our office.  [place]: [address] [place], [place] [address] -- [place]: [address] [place], [place] [address] -- [phone]  [place]: [place], [place] [address] -- [phone]  [place]: [address] [place], [place] [employer], [place] [address] -- [phone]  Website: [email or web address]

## 2018-10-10 ENCOUNTER — Ambulatory Visit: Payer: Medicare Other | Admitting: Physical Therapy

## 2018-10-12 ENCOUNTER — Encounter: Payer: Self-pay | Admitting: Podiatry

## 2018-10-12 ENCOUNTER — Ambulatory Visit: Payer: BLUE CROSS/BLUE SHIELD | Admitting: Physical Therapy

## 2018-10-13 ENCOUNTER — Encounter: Payer: Self-pay | Admitting: Physical Therapy

## 2018-10-13 ENCOUNTER — Ambulatory Visit: Payer: Medicare Other | Admitting: Physical Therapy

## 2018-10-13 DIAGNOSIS — M545 Low back pain, unspecified: Secondary | ICD-10-CM

## 2018-10-13 DIAGNOSIS — R2681 Unsteadiness on feet: Secondary | ICD-10-CM | POA: Diagnosis not present

## 2018-10-13 DIAGNOSIS — G8929 Other chronic pain: Secondary | ICD-10-CM

## 2018-10-13 DIAGNOSIS — M6281 Muscle weakness (generalized): Secondary | ICD-10-CM

## 2018-10-13 NOTE — Addendum Note (Signed)
Addended by: Cameron Sprang A on: 10/13/2018 12:06 PM   Modules accepted: Orders

## 2018-10-13 NOTE — Therapy (Signed)
[facility] [address] [place], [place], [address] Phone: [phone]   Fax:  [phone]  Physical Therapy Treatment  Patient Details  Name: Ashley [name] MRN: [id number] Date of Birth: 08/20/1976 Referring Provider (PT): Dr. Kristeen Miss    Encounter Date: 10/13/2018  PT End of Session - 10/13/18 0854    Visit Number  19    Number of Visits  25   Per updated POC   Date for PT Re-Evaluation  11/02/18   per updated POC   Authorization Type  [facility] 10th visit progress note    PT Start Time  0850    PT Stop Time  0935    PT Time Calculation (min)  45 min    Activity Tolerance  Patient limited by pain    Behavior During Therapy  [facility] for tasks assessed/performed       Past Medical History:  Diagnosis Date  . Arthritis    knees, back  . 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 Complication of anesthesia   . Depression   . History of pneumonia   . Hypertension   . Lumbar spondylosis   . PCOS (polycystic ovarian syndrome)   . Pneumonia    2016  . PONV (postoperative nausea and vomiting)     Past Surgical History:  Procedure Laterality Date  . ABDOMINAL EXPOSURE N/A 02/17/2018   Procedure: ABDOMINAL EXPOSURE;  Surgeon: Rosetta Posner, MD;  Location: [facility] OR;  Service: Vascular;  Laterality: N/A;  . ANKLE SURGERY     x 2  . ANTERIOR LAT LUMBAR FUSION N/A 02/17/2018   Procedure: Lumbar three-four Lumbar four-five  Anterolateral lumbar interbody fusion;  Surgeon: Kristeen Miss, MD;  Location: [facility];  Service: Neurosurgery;  Laterality: N/A;  . ANTERIOR LUMBAR FUSION N/A 02/17/2018   Procedure: Lumbar five-Sacral one Anterior lumbar interbody fusion;  Surgeon: Kristeen Miss, MD;  Location: [facility];  Service: Neurosurgery;  Laterality: N/A;  . APPLICATION OF ROBOTIC ASSISTANCE FOR SPINAL PROCEDURE N/A 02/17/2018   Procedure: APPLICATION OF ROBOTIC ASSISTANCE FOR SPINAL PROCEDURE;  Surgeon: Kristeen Miss, MD;  Location: [facility];  Service: Neurosurgery;  Laterality: N/A;  . APPLICATION OF ROBOTIC ASSISTANCE FOR SPINAL PROCEDURE N/A 06/09/2018   Procedure: APPLICATION OF ROBOTIC ASSISTANCE FOR SPINAL PROCEDURE;  Surgeon: Kristeen Miss, MD;  Location: [facility];  Service: Neurosurgery;  Laterality: N/A;  . BACK SURGERY     2017  discectomy  . CARPAL TUNNEL RELEASE Left 07/04/2014   Procedure: LEFT CARPAL TUNNEL RELEASE;  Surgeon: Ninetta Lights, MD;  Location: [facility];  Service: Orthopedics;  Laterality: Left;  . CARPAL TUNNEL RELEASE Right 08/08/2014   Procedure: RIGHT CARPAL TUNNEL RELEASE;  Surgeon: Ninetta Lights, MD;  Location: [facility];  Service: Orthopedics;  Laterality: Right;  . DILATION AND CURETTAGE OF UTERUS    . DILATION AND EVACUATION  04/02/2011   Procedure: DILATATION AND EVACUATION (D&E);  Surgeon: Luz Lex, MD;  Location: [facility];  Service: Gynecology;  Laterality: N/A;  dvt left mid thigh  . DORSAL COMPARTMENT RELEASE Left 07/04/2014   Procedure: LEFT DEQUERVAINS;  Surgeon: Ninetta Lights, MD;  Location: [facility];  Service: Orthopedics;  Laterality: Left;  . ENDOSCOPIC PLANTAR FASCIOTOMY Left 02/01/2002  . HARDWARE REMOVAL Right 06/09/2018   Procedure: Repositioning of Right Lumbar three Right lumbar  four pedicle screw with MAZOR;  Surgeon: Kristeen Miss, MD;  Location: [facility];  Service: Neurosurgery;  Laterality: Right;  . HYSTEROSCOPY W/D&C  07/17/2010   with exc. endometrial polyps  . KNEE ARTHROSCOPY Right 03/07/2003; 01/14/2005; 08/31/2006  . KNEE ARTHROSCOPY Left 03/21/2003; 11/26/2004  . LUMBAR PERCUTANEOUS PEDICLE SCREW 3 LEVEL N/A 02/17/2018   Procedure: LUMBAR PERCUTANEOUS PEDICLE SCREW PLACEMENT LUMBAR THREE-SACRAL ONE;  Surgeon: Kristeen Miss, MD;  Location: [facility];  Service: Neurosurgery;  Laterality: N/A;  . SHOULDER ARTHROSCOPY Right   . TOE SURGERY Left 01/10/2003   claw toe correction 2nd, 3rd, 4th toes  . TOTAL  HIP ARTHROPLASTY Right 04/21/2016  . TOTAL HIP ARTHROPLASTY Right 04/21/2016   Procedure: TOTAL HIP ARTHROPLASTY ANTERIOR APPROACH;  Surgeon: Ninetta Lights, MD;  Location: [facility];  Service: Orthopedics;  Laterality: Right;  . TOTAL KNEE ARTHROPLASTY Left 01/21/2010  . TOTAL KNEE ARTHROPLASTY Right 08/26/2008    There were no vitals filed for this visit.  Subjective Assessment - 10/13/18 0852    Subjective  Had ingrown toe nail removed on Monday, still sore today.     Pertinent History  Past Surgical Hx: B TKA (2009, 2011) and R THA 2017. PMH includes; OA, Morbid Obesity, Depression, HTN, Lumbar Spondylosis, Polycystic Ovarian Syndrome, Pneumonia, and PONV    Limitations  Standing;Walking;Lifting;Sitting    How long can you sit comfortably?  Sitting is uncomfortable. No amount of time specified.     How long can you stand comfortably?  about 5-10 mins    How long can you walk comfortably?  Short period of time with no time period specified    Patient Stated Goals  Reports goal of being more mobile with increased flexibility. Wants to "atleast put lotion on her feet."    Currently in Pain?  Yes    Pain Score  4     Pain Location  Generalized    Pain Descriptors / Indicators  Aching;Sore    Pain Type  Chronic pain    Pain Onset  More than a month ago    Pain Frequency  Constant    Aggravating Factors   walking    Pain Relieving Factors  not wallking                       [facility] Adult PT Treatment/Exercise - 10/13/18 0854      Exercises   Exercises  Other Exercises    Other Exercises   passive hamstring stretch at edge of mat with leg along edge of mat-      Knee/Hip Exercises: Stretches   Hip Flexor Stretch  Right;2 reps;30 seconds    Hip Flexor Stretch Limitations  in sidelying with TFL stretch    ITB Stretch  Right;2 reps;30 seconds    ITB Stretch Limitations  in L sidelying after TDN while performing manual work      Manual Therapy   Manual Therapy  Soft tissue  mobilization    Soft tissue mobilization  STM to R quad, TFL muscles and scar multidirectional and cross-friction massage/release to R hip scar       Trigger Point Dry Needling - 10/13/18 1121    Consent Given?  Yes    Muscles Treated Lower Body  Tensor fascia lata;Quadriceps    Tensor Fascia Lata Response  --   change in soft tissue    Quadriceps Response  Twitch response elicited           PT Education - 10/13/18 1125    Education Details  effect of emotions, lack of sleep  and stress response on pain intensity.  Use of expressive writing in am and pm for pain management.    Person(s) Educated  Patient    Methods  Explanation;Demonstration    Comprehension  Verbalized understanding       PT Short Term Goals - 08/10/18 0956      PT SHORT TERM GOAL #1   Title  Pt will initiate HEP in order to indicate improved functional mobility and decreased fall risk.  (STG DUE 11/13)    Baseline  met 08/07/18    Time  4    Period  Weeks    Status  Achieved      PT SHORT TERM GOAL #2   Title  Pt will improve gait speed to >/= 2.57 ft/sec with LRAD in order to indicate decreased fall risk     Baseline  10/14: 2.27 ft/ sec with no AD to 3.16 ft/sec without AD on 08/07/18    Time  4    Period  Weeks    Status  Achieved      PT SHORT TERM GOAL #3   Title  Pt will perform 5TSS </=17.57 secs with single UE support only in order to indicate decreased fall risk and improved functional strength.      Baseline  10/14: 22.74 from standard height chair with B UE assist to 16.38 secs without UE support.     Time  4    Period  Weeks    Status  Achieved      PT SHORT TERM GOAL #4   Title  Pt will improve BERG balance score to >/=34/56 in order to indicate decreased fall risk.     Baseline  10/14: 30/56 indicative of high fall risk potential to 51/56 on 08/07/18    Time  4    Period  Weeks    Status  Achieved      PT SHORT TERM GOAL #5   Title  Pt will negotiate up/down 8 steps with  bilateral rails with supervision in order to indicate improved LE strength    Baseline  met with single rail reciprocal pattern 08/07/18    Time  4    Period  Weeks    Status  Achieved      PT SHORT TERM GOAL #6   Title  Pt will ambulate 300' on uneven surfaces, ramps, curbs, inclines, demonstrating ability to vary gait speed when cued using LRAD and therapist providing supervision to improve safety with community distances    met 08/07/18   Time  4    Period  Weeks    Status  Achieved      PT SHORT TERM GOAL #7   Title  Pt will participate in 6 min walk test to assess baseline endurance with functional mobility     Time  4    Period  Weeks    Status  Achieved        PT Long Term Goals - 10/03/18 0912      PT LONG TERM GOAL #1   Title  Pt will be independent with final HEP in order to indicate improved functional mobilty and decreased fall risk.   (LTG DUE 11/02/18)    Baseline  met per verbal report, will make ongoing to include new/updated exercises    Time  4    Period  Weeks    Status  On-going      PT LONG TERM GOAL #2   Title  Pt will  improve gait speed to >/=4.10 ft/sec w/ LRAD in order to indicate decreased fall risk and safe community ambulator.     Baseline  3.78 ft/sec on 09/22/18    Time  4    Period  Weeks    Status  Revised      PT LONG TERM GOAL #3   Title  Pt will perform 5TSS in </=13 secs without UE support in order to indicate decreased fall risk and improved functional strength.     Baseline  16.38 secs without UE support to 13.34 secs without UE support on 09/22/18    Time  8    Period  Weeks    Status  On-going      PT LONG TERM GOAL #4   Title  Will assess FGA and improve score by 3 points in order to indicate decreased fall risk.     Baseline  --    Time  4    Period  Weeks    Status  New      PT LONG TERM GOAL #5   Title  Pt will negotiate 4 steps without HR utilizing reciprocal stepping technique at Mod I level to increase safety with  community accessibility.     Baseline  --    Time  4    Period  Weeks    Status  Revised      PT LONG TERM GOAL #6   Title  Pt will ambulate x 500' outdoors/indoors without AD with less antaglic gait pattern in order to indicate improved fluidity of gait and decreased pain.     Baseline  --    Time  4    Period  Weeks    Status  New      PT LONG TERM GOAL #7   Title  Pt will improve 6 min walk test by 150' indicating improvement with functional endurance    10/25: Patient performs 675 with rollator to 818' 08/10/18   Baseline  1131' on 10/03/18    Time  4    Period  Weeks    Status  Revised            Plan - 10/13/18 0854    Clinical Impression Statement  Pt unable to tolerate plyometric training today due to recent ingrown nail removal.  Pt experiencing increased pain in R hip, groin and thigh area with tenderness to palpation over TFL.  Performed TDN to TFL and quads followed by soft tissue work and stretches to increase LE ROM and decrease pain.  Pt reported slight decrease in pain at end of session.  Will continue to address and progress towards LTG.    Rehab Potential  Good    Clinical Impairments Affecting Rehab Potential  Pt is motivated to improve. Strength gains may be limited 2/2 to nerve regeneration process     PT Frequency  2x / week    PT Duration  4 weeks    PT Treatment/Interventions  ADLs/Self Care Home Management;Aquatic Therapy;DME Instruction;Gait training;Stair training;Functional mobility training;Therapeutic activities;Therapeutic exercise;Balance training;Neuromuscular re-education;Patient/family education;Electrical Stimulation;Passive range of motion;Energy conservation    PT Next Visit Plan  Dry needling to R thigh as able, Jumping/plyometric type exercises, R hip flexion Continue Bioness to R quad during gait, stairs, ramp/curb and exercises,  Set up her NMES unit if she brings to session and give exercises to do with this on at home.  Endurance,  increasing gait speed (treadmill)    PT Home Exercise Plan  [id number]     Consulted and Agree with Plan of Care  Patient       Patient will benefit from skilled therapeutic intervention in order to improve the following deficits and impairments:  Abnormal gait, Decreased activity tolerance, Decreased balance, Decreased coordination, Decreased endurance, Decreased mobility, Decreased range of motion, Decreased strength, Impaired perceived functional ability, Impaired flexibility, Impaired sensation, Pain, Decreased knowledge of use of DME, Decreased knowledge of precautions, Obesity  Visit Diagnosis: Muscle weakness (generalized)  Chronic midline low back pain without sciatica     Problem List Patient Active Problem List   Diagnosis Date Noted  . Lumbosacral radiculopathy at L4 06/09/2018  . Generalized anxiety disorder   . Pain   . Fall   . Labile blood pressure   . Neuropathic pain   . Therapeutic opioid induced constipation   . Urinary retention   . Lumbar radiculopathy 02/21/2018  . PCOS (polycystic ovarian syndrome)   . Morbid obesity ([place])   . Depression   . Generalized OA   . Postoperative pain   . Acute blood loss anemia   . Status post surgery 02/17/2018  . Lumbar radiculopathy, chronic 02/17/2018  . Primary localized osteoarthritis of right hip 04/21/2016  . Sinusitis 06/25/2015  . Cough 06/25/2015  . Essential hypertension 04/03/2015  . Dehydration 04/03/2015  . CAP (community acquired pneumonia) 03/31/2015  . Right lower lobe pneumonia ([place]) 03/31/2015  . Fever 03/31/2015  . Pregnancy 04/29/2014  . Miscarriage   . Incomplete miscarriage 04/02/2011    Class: Acute  . 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 [place], [place], [address] Phone: [phone]   Fax:  [phone]  Name: Ashley [name] MRN: [id number] Date of Birth: 09-19-1976

## 2018-10-16 NOTE — Progress Notes (Signed)
Subjective: Patient presents today for evaluation of pain to the medial border of the right great toe that began a few months ago. Patient is concerned for possible ingrown nail. She is also here with a complaint of pain to the 3rd and 4th toe on the right foot. Wearing shoes increases the pain for both areas. She has not done anything for treatment. Patient presents today for further treatment and evaluation.  Past Medical History:  Diagnosis Date  . Arthritis    knees, back  . 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 Complication of anesthesia   . Depression   . History of pneumonia   . Hypertension   . Lumbar spondylosis   . PCOS (polycystic ovarian syndrome)   . Pneumonia    2016  . PONV (postoperative nausea and vomiting)     Objective:  General: Well developed, nourished, in no acute distress, alert and oriented x3   Dermatology: Skin is warm, dry and supple bilateral. Medial border right hallux appears to be erythematous with evidence of an ingrowing nail. Pain on palpation noted to the border of the nail fold. Hyperkeratotic, discolored, thickened, onychodystrophy of the right great toenail. The remaining nails appear unremarkable at this time. There are no open sores, lesions.  Vascular: Dorsalis Pedis artery and Posterior Tibial artery pedal pulses palpable. No lower extremity edema noted.   Neruologic: Grossly intact via light touch bilateral.  Musculoskeletal: Hammertoe contracture deformity noted to digits 3 and 4 of the right foot. Muscular strength within normal limits in all groups bilateral. Normal range of motion noted to all pedal and ankle joints.   Radiographic Exam: Hammertoe contracture deformity noted to the interphalangeal joints and MPJ of the respective hammertoe digits mentioned on clinical musculoskeletal exam.  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 Patient evaluated.  2. Discussed treatment alternatives and plan of care. Explained nail avulsion procedure and post procedure course to patient. 3. Patient opted for permanent partial nail avulsion of the medial border of the right great toe.  4. Prior to procedure, local anesthesia infiltration utilized using 3 ml of a 50:50 mixture of 2% plain lidocaine and 0.5% plain marcaine in a normal hallux block fashion and a betadine prep performed.  5. Partial permanent nail avulsion with chemical matrixectomy performed using [id number] applications of phenol followed by alcohol flush.  6. Light dressing applied. 7. Today we discussed the conservative versus surgical management of the presenting pathology. The patient opts for surgical management. All possible complications and details of the procedure were explained. All patient questions were answered. No guarantees were expressed or implied. 8. Authorization for surgery was initiated today. Surgery will consist of hammertoe arthrodesis with screw fixation digits 3, 4 right.  9. Prescription for Gentamicin cream provided to patient to use daily with a bandage.  10. Prescription for Lamisil 250 mg #90 provided to patient.  11. Return to clinic in 3 weeks.     Edrick Kins, DPM [facility]  Dr. Edrick Kins, [place]                                        [place], [place] [address]  Office [phone]  Fax [phone]

## 2018-10-17 ENCOUNTER — Ambulatory Visit: Payer: BLUE CROSS/BLUE SHIELD | Admitting: Physical Therapy

## 2018-10-18 NOTE — Addendum Note (Signed)
Addended by: Allena Katz R on: 10/18/2018 04:00 PM   Modules accepted: Orders

## 2018-10-19 ENCOUNTER — Ambulatory Visit: Payer: Medicare Other | Admitting: Physical Therapy

## 2018-10-20 ENCOUNTER — Ambulatory Visit: Payer: Medicare Other | Admitting: Rehabilitation

## 2018-10-23 ENCOUNTER — Ambulatory Visit: Payer: Medicare Other | Admitting: Podiatry

## 2018-10-23 DIAGNOSIS — M2042 Other hammer toe(s) (acquired), left foot: Secondary | ICD-10-CM

## 2018-10-23 DIAGNOSIS — L600 Ingrowing nail: Secondary | ICD-10-CM | POA: Diagnosis not present

## 2018-10-23 DIAGNOSIS — M2041 Other hammer toe(s) (acquired), right foot: Secondary | ICD-10-CM | POA: Diagnosis not present

## 2018-10-24 ENCOUNTER — Encounter: Payer: Self-pay | Admitting: Physical Therapy

## 2018-10-24 ENCOUNTER — Ambulatory Visit: Payer: Medicare Other | Attending: Neurological Surgery | Admitting: Physical Therapy

## 2018-10-24 DIAGNOSIS — G8929 Other chronic pain: Secondary | ICD-10-CM | POA: Diagnosis present

## 2018-10-24 DIAGNOSIS — M545 Low back pain, unspecified: Secondary | ICD-10-CM

## 2018-10-24 DIAGNOSIS — M6281 Muscle weakness (generalized): Secondary | ICD-10-CM

## 2018-10-24 DIAGNOSIS — R2681 Unsteadiness on feet: Secondary | ICD-10-CM

## 2018-10-24 NOTE — Therapy (Signed)
[facility] [address] [place], [place], [address] Phone: [phone]   Fax:  [phone]  Physical Therapy Treatment  Patient Details  Name: SHUNDA RABADI MRN: [id number] Date of Birth: 1976-08-31 Referring Provider (PT): Dr. Kristeen Miss    Encounter Date: 10/24/2018  PT End of Session - 10/24/18 1240    Visit Number  20    Number of Visits  25   Per updated POC   Date for PT Re-Evaluation  11/02/18   per updated POC   Authorization Type  [facility] 10th visit progress note    PT Start Time  1240   pt late for appt   PT Stop Time  1310   ended due to incr BP    PT Time Calculation (min)  30 min    Equipment Utilized During Treatment  Gait belt    Activity Tolerance  Patient tolerated treatment well;Treatment limited secondary to medical complications (Comment)    Behavior During Therapy  [facility] for tasks assessed/performed       Past Medical History:  Diagnosis Date  . Arthritis    knees, back  . 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 Complication of anesthesia   . Depression   . History of pneumonia   . Hypertension   . Lumbar spondylosis   . PCOS (polycystic ovarian syndrome)   . Pneumonia    2016  . PONV (postoperative nausea and vomiting)     Past Surgical History:  Procedure Laterality Date  . ABDOMINAL EXPOSURE N/A 02/17/2018   Procedure: ABDOMINAL EXPOSURE;  Surgeon: Rosetta Posner, MD;  Location: [facility] OR;  Service: Vascular;  Laterality: N/A;  . ANKLE SURGERY     x 2  . ANTERIOR LAT LUMBAR FUSION N/A 02/17/2018   Procedure: Lumbar three-four Lumbar four-five  Anterolateral lumbar interbody fusion;  Surgeon: Kristeen Miss, MD;  Location: [facility];  Service: Neurosurgery;  Laterality: N/A;  . ANTERIOR LUMBAR FUSION N/A 02/17/2018   Procedure: Lumbar five-Sacral one Anterior lumbar interbody fusion;  Surgeon: Kristeen Miss, MD;  Location: [place];  Service: Neurosurgery;  Laterality:  N/A;  . APPLICATION OF ROBOTIC ASSISTANCE FOR SPINAL PROCEDURE N/A 02/17/2018   Procedure: APPLICATION OF ROBOTIC ASSISTANCE FOR SPINAL PROCEDURE;  Surgeon: Kristeen Miss, MD;  Location: [place];  Service: Neurosurgery;  Laterality: N/A;  . APPLICATION OF ROBOTIC ASSISTANCE FOR SPINAL PROCEDURE N/A 06/09/2018   Procedure: APPLICATION OF ROBOTIC ASSISTANCE FOR SPINAL PROCEDURE;  Surgeon: Kristeen Miss, MD;  Location: [place];  Service: Neurosurgery;  Laterality: N/A;  . BACK SURGERY     2017  discectomy  . CARPAL TUNNEL RELEASE Left 07/04/2014   Procedure: LEFT CARPAL TUNNEL RELEASE;  Surgeon: Ninetta Lights, MD;  Location: [place];  Service: Orthopedics;  Laterality: Left;  . CARPAL TUNNEL RELEASE Right 08/08/2014   Procedure: RIGHT CARPAL TUNNEL RELEASE;  Surgeon: Ninetta Lights, MD;  Location: [facility];  Service: Orthopedics;  Laterality: Right;  . DILATION AND CURETTAGE OF UTERUS    . DILATION AND EVACUATION  04/02/2011   Procedure: DILATATION AND EVACUATION (D&E);  Surgeon: Luz Lex, MD;  Location: [facility];  Service: Gynecology;  Laterality: N/A;  dvt left mid thigh  . DORSAL COMPARTMENT RELEASE Left 07/04/2014   Procedure: LEFT DEQUERVAINS;  Surgeon: Ninetta Lights, MD;  Location: [place];  Service: Orthopedics;  Laterality: Left;  . ENDOSCOPIC PLANTAR FASCIOTOMY Left 02/01/2002  . HARDWARE  REMOVAL Right 06/09/2018   Procedure: Repositioning of Right Lumbar three Right lumbar  four pedicle screw with MAZOR;  Surgeon: Kristeen Miss, MD;  Location: [facility];  Service: Neurosurgery;  Laterality: Right;  . HYSTEROSCOPY W/D&C  07/17/2010   with exc. endometrial polyps  . KNEE ARTHROSCOPY Right 03/07/2003; 01/14/2005; 08/31/2006  . KNEE ARTHROSCOPY Left 03/21/2003; 11/26/2004  . LUMBAR PERCUTANEOUS PEDICLE SCREW 3 LEVEL N/A 02/17/2018   Procedure: LUMBAR PERCUTANEOUS PEDICLE SCREW PLACEMENT LUMBAR THREE-SACRAL ONE;  Surgeon: Kristeen Miss, MD;  Location:  [facility] [place];  Service: Neurosurgery;  Laterality: N/A;  . SHOULDER ARTHROSCOPY Right   . TOE SURGERY Left 01/10/2003   claw toe correction 2nd, 3rd, 4th toes  . TOTAL HIP ARTHROPLASTY Right 04/21/2016  . TOTAL HIP ARTHROPLASTY Right 04/21/2016   Procedure: TOTAL HIP ARTHROPLASTY ANTERIOR APPROACH;  Surgeon: Ninetta Lights, MD;  Location: [place];  Service: Orthopedics;  Laterality: Right;  . TOTAL KNEE ARTHROPLASTY Left 01/21/2010  . TOTAL KNEE ARTHROPLASTY Right 08/26/2008    There were no vitals filed for this visit.  Subjective Assessment - 10/24/18 1330    Subjective  Had a follow up with podiatrist yesterday, he says the area is healling well from her ingrown toe nail that was removed. Still tender. Having another foot surgery for hammer toe on 11/30/18. No falls.    Pertinent History  Past Surgical Hx: B TKA (2009, 2011) and R THA 2017. PMH includes; OA, Morbid Obesity, Depression, HTN, Lumbar Spondylosis, Polycystic Ovarian Syndrome, Pneumonia, and PONV    Limitations  Standing;Walking;Lifting;Sitting    How long can you sit comfortably?  Sitting is uncomfortable. No amount of time specified.     How long can you stand comfortably?  about 5-10 mins    How long can you walk comfortably?  Short period of time with no time period specified    Patient Stated Goals  Reports goal of being more mobile with increased flexibility. Wants to "atleast put lotion on her feet."    Currently in Pain?  Yes    Pain Score  3     Pain Location  Generalized    Pain Descriptors / Indicators  Aching;Sore    Pain Type  Chronic pain    Pain Onset  More than a month ago    Pain Frequency  Constant    Aggravating Factors   walking    Pain Relieving Factors  resting         [facility] PT Assessment - 10/24/18 1247      Transfers   Transfers  Sit to Stand;Stand to Sit    Sit to Stand  6: Modified independent (Device/Increase time)    Stand to Sit  6: Modified independent (Device/Increase time)      Ambulation/Gait    Ambulation/Gait  Yes    Gait velocity  9.78= 3.35 ft/sec no AD      6 Minute Walk- Baseline   6 Minute Walk- Baseline  yes    BP (mmHg)  (!) 145/99    HR (bpm)  81    02 Sat (%RA)  98 %    Modified Borg Scale for Dyspnea  0- Nothing at all    Perceived Rate of Exertion (Borg)  6-      6 Minute walk- Post Test   6 Minute Walk Post Test  yes    BP (mmHg)  (!) 145/102    HR (bpm)  82    02 Sat (%RA)  97 %  Modified Borg Scale for Dyspnea  2- Mild shortness of breath    Perceived Rate of Exertion (Borg)  10-      6 minute walk test results    Aerobic Endurance Distance Walked  1389    Endurance additional comments  no AD or rest breaks           [facility] Adult PT Treatment/Exercise - 10/24/18 1247      Self-Care   Self-Care  Other Self-Care Comments    Other Self-Care Comments   Discussed current HEP. Pt reports no issues, still challenging to her.                PT Short Term Goals - 08/10/18 0956      PT SHORT TERM GOAL #1   Title  Pt will initiate HEP in order to indicate improved functional mobility and decreased fall risk.  (STG DUE 11/13)    Baseline  met 08/07/18    Time  4    Period  Weeks    Status  Achieved      PT SHORT TERM GOAL #2   Title  Pt will improve gait speed to >/= 2.57 ft/sec with LRAD in order to indicate decreased fall risk     Baseline  10/14: 2.27 ft/ sec with no AD to 3.16 ft/sec without AD on 08/07/18    Time  4    Period  Weeks    Status  Achieved      PT SHORT TERM GOAL #3   Title  Pt will perform 5TSS </=17.57 secs with single UE support only in order to indicate decreased fall risk and improved functional strength.      Baseline  10/14: 22.74 from standard height chair with B UE assist to 16.38 secs without UE support.     Time  4    Period  Weeks    Status  Achieved      PT SHORT TERM GOAL #4   Title  Pt will improve BERG balance score to >/=34/56 in order to indicate decreased fall risk.     Baseline  10/14: 30/56  indicative of high fall risk potential to 51/56 on 08/07/18    Time  4    Period  Weeks    Status  Achieved      PT SHORT TERM GOAL #5   Title  Pt will negotiate up/down 8 steps with bilateral rails with supervision in order to indicate improved LE strength    Baseline  met with single rail reciprocal pattern 08/07/18    Time  4    Period  Weeks    Status  Achieved      PT SHORT TERM GOAL #6   Title  Pt will ambulate 300' on uneven surfaces, ramps, curbs, inclines, demonstrating ability to vary gait speed when cued using LRAD and therapist providing supervision to improve safety with community distances    met 08/07/18   Time  4    Period  Weeks    Status  Achieved      PT SHORT TERM GOAL #7   Title  Pt will participate in 6 min walk test to assess baseline endurance with functional mobility     Time  4    Period  Weeks    Status  Achieved        PT Long Term Goals - 10/24/18 1245      PT LONG TERM GOAL #1   Title  Pt  will be independent with final HEP in order to indicate improved functional mobilty and decreased fall risk.   (LTG DUE 11/02/18)    Baseline  10/24/18: current program is good, still challenging    Status  Achieved      PT LONG TERM GOAL #2   Title  Pt will improve gait speed to >/=4.10 ft/sec w/ LRAD in order to indicate decreased fall risk and safe community ambulator.     Baseline  10/24/18: 3.35 ft/sec no AD , decreased from last assessment (was 3.78 ft/sec)    Time  --    Period  --    Status  Not Met      PT LONG TERM GOAL #3   Title  Pt will perform 5TSS in </=13 secs without UE support in order to indicate decreased fall risk and improved functional strength.     Baseline  16.38 secs without UE support to 13.34 secs without UE support on 09/22/18    Time  8    Period  Weeks    Status  On-going      PT LONG TERM GOAL #4   Title  Will assess FGA and improve score by 3 points in order to indicate decreased fall risk.     Time  4    Period  Weeks     Status  On-going      PT LONG TERM GOAL #5   Title  Pt will negotiate 4 steps without HR utilizing reciprocal stepping technique at Mod I level to increase safety with community accessibility.     Time  4    Period  Weeks    Status  On-going      PT LONG TERM GOAL #6   Title  Pt will ambulate x 500' outdoors/indoors without AD with less antaglic gait pattern in order to indicate improved fluidity of gait and decreased pain.     Time  4    Period  Weeks    Status  On-going      PT LONG TERM GOAL #7   Title  Pt will improve 6 min walk test by 150' indicating improvement with functional endurance    10/25: Patient performs 675 with rollator to 818' 08/10/18   Baseline  10/24/18: 1389 feet today with no AD (258 feet increase)    Time  --    Period  --    Status  Achieved            Plan - 10/24/18 1245    Clinical Impression Statement  Began to address progress toward LTGs for anticipated recert by primary PT Cameron Sprang, PT plans to recert) as pt has been limited with attendance during current certification due to other medical issues with her foot. Pt's BP increased with 6 minute walk test above acceptable parameters. When asked if she took her BP meds, pt stated "oh, i forgot them". Session ended early due to elevated BP (154/107) even after 5 minute seated rest with water. Will plan to check remaining LTGs at next session.     Rehab Potential  Good    Clinical Impairments Affecting Rehab Potential  Pt is motivated to improve. Strength gains may be limited 2/2 to nerve regeneration process     PT Frequency  2x / week    PT Duration  4 weeks    PT Treatment/Interventions  ADLs/Self Care Home Management;Aquatic Therapy;DME Instruction;Gait training;Stair training;Functional mobility training;Therapeutic activities;Therapeutic exercise;Balance training;Neuromuscular re-education;Patient/family education;Electrical Stimulation;Passive range of  motion;Energy conservation;Manual  techniques;Dry needling    PT Next Visit Plan  check remaining LTGs, then send to [facility] for recert. continue with dry neeedling as able, continue with high level balance/agility drills/plyometric drills    PT Home Exercise Plan   [id number]     Consulted and Agree with Plan of Care  Patient       Patient will benefit from skilled therapeutic intervention in order to improve the following deficits and impairments:  Abnormal gait, Decreased activity tolerance, Decreased balance, Decreased coordination, Decreased endurance, Decreased mobility, Decreased range of motion, Decreased strength, Impaired perceived functional ability, Impaired flexibility, Impaired sensation, Pain, Decreased knowledge of use of DME, Decreased knowledge of precautions, Obesity  Visit Diagnosis: Muscle weakness (generalized)  Unsteadiness on feet  Chronic midline low back pain without sciatica     Problem List Patient Active Problem List   Diagnosis Date Noted  . Lumbosacral radiculopathy at L4 06/09/2018  . Generalized anxiety disorder   . Pain   . Fall   . Labile blood pressure   . Neuropathic pain   . Therapeutic opioid induced constipation   . Urinary retention   . Lumbar radiculopathy 02/21/2018  . PCOS (polycystic ovarian syndrome)   . Morbid obesity ([place])   . Depression   . Generalized OA   . Postoperative pain   . Acute blood loss anemia   . Status post surgery 02/17/2018  . Lumbar radiculopathy, chronic 02/17/2018  . Primary localized osteoarthritis of right hip 04/21/2016  . Sinusitis 06/25/2015  . Cough 06/25/2015  . Essential hypertension 04/03/2015  . Dehydration 04/03/2015  . CAP (community acquired pneumonia) 03/31/2015  . Right lower lobe pneumonia ([place]) 03/31/2015  . Fever 03/31/2015  . Pregnancy 04/29/2014  . Miscarriage   . Incomplete miscarriage 04/02/2011    Class: Acute  . 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

## 2018-10-26 ENCOUNTER — Ambulatory Visit: Payer: Medicare Other | Admitting: Physical Therapy

## 2018-10-26 ENCOUNTER — Encounter: Payer: Self-pay | Admitting: Physical Therapy

## 2018-10-26 DIAGNOSIS — G8929 Other chronic pain: Secondary | ICD-10-CM

## 2018-10-26 DIAGNOSIS — M545 Low back pain: Secondary | ICD-10-CM

## 2018-10-26 DIAGNOSIS — M6281 Muscle weakness (generalized): Secondary | ICD-10-CM

## 2018-10-26 DIAGNOSIS — R2681 Unsteadiness on feet: Secondary | ICD-10-CM

## 2018-10-27 NOTE — Therapy (Addendum)
[facility] [address] [place], [place], [address] Phone: [phone]   Fax:  [phone]  Physical Therapy Treatment  Patient Details  Name: Ashley [name] MRN: [id number] Date of Birth: Jan 06, 1976 Referring Provider (PT): Dr. Kristeen Miss    Encounter Date: 10/26/2018  PT End of Session - 10/26/18 0855    Visit Number  21    Number of Visits  25   Per updated POC   Date for PT Re-Evaluation  11/02/18   per updated POC   Authorization Type  [place] 10th visit progress note    PT Start Time  0850    PT Stop Time  0930    PT Time Calculation (min)  40 min    Equipment Utilized During Treatment  Gait belt    Activity Tolerance  Patient tolerated treatment well;Patient limited by pain    Behavior During Therapy  [facility] for tasks assessed/performed       Past Medical History:  Diagnosis Date  . Arthritis    knees, back  . 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 Complication of anesthesia   . Depression   . History of pneumonia   . Hypertension   . Lumbar spondylosis   . PCOS (polycystic ovarian syndrome)   . Pneumonia    2016  . PONV (postoperative nausea and vomiting)     Past Surgical History:  Procedure Laterality Date  . ABDOMINAL EXPOSURE N/A 02/17/2018   Procedure: ABDOMINAL EXPOSURE;  Surgeon: Rosetta Posner, MD;  Location: [facility] OR;  Service: Vascular;  Laterality: N/A;  . ANKLE SURGERY     x 2  . ANTERIOR LAT LUMBAR FUSION N/A 02/17/2018   Procedure: Lumbar three-four Lumbar four-five  Anterolateral lumbar interbody fusion;  Surgeon: Kristeen Miss, MD;  Location: [place];  Service: Neurosurgery;  Laterality: N/A;  . ANTERIOR LUMBAR FUSION N/A 02/17/2018   Procedure: Lumbar five-Sacral one Anterior lumbar interbody fusion;  Surgeon: Kristeen Miss, MD;  Location: [place];  Service: Neurosurgery;  Laterality: N/A;  . APPLICATION OF ROBOTIC ASSISTANCE FOR SPINAL PROCEDURE N/A 02/17/2018    Procedure: APPLICATION OF ROBOTIC ASSISTANCE FOR SPINAL PROCEDURE;  Surgeon: Kristeen Miss, MD;  Location: [place];  Service: Neurosurgery;  Laterality: N/A;  . APPLICATION OF ROBOTIC ASSISTANCE FOR SPINAL PROCEDURE N/A 06/09/2018   Procedure: APPLICATION OF ROBOTIC ASSISTANCE FOR SPINAL PROCEDURE;  Surgeon: Kristeen Miss, MD;  Location: [facility];  Service: Neurosurgery;  Laterality: N/A;  . BACK SURGERY     2017  discectomy  . CARPAL TUNNEL RELEASE Left 07/04/2014   Procedure: LEFT CARPAL TUNNEL RELEASE;  Surgeon: Ninetta Lights, MD;  Location: [facility];  Service: Orthopedics;  Laterality: Left;  . CARPAL TUNNEL RELEASE Right 08/08/2014   Procedure: RIGHT CARPAL TUNNEL RELEASE;  Surgeon: Ninetta Lights, MD;  Location: [place];  Service: Orthopedics;  Laterality: Right;  . DILATION AND CURETTAGE OF UTERUS    . DILATION AND EVACUATION  04/02/2011   Procedure: DILATATION AND EVACUATION (D&E);  Surgeon: Luz Lex, MD;  Location: [facility];  Service: Gynecology;  Laterality: N/A;  dvt left mid thigh  . DORSAL COMPARTMENT RELEASE Left 07/04/2014   Procedure: LEFT DEQUERVAINS;  Surgeon: Ninetta Lights, MD;  Location: [facility];  Service: Orthopedics;  Laterality: Left;  . ENDOSCOPIC PLANTAR FASCIOTOMY Left 02/01/2002  . HARDWARE REMOVAL Right 06/09/2018   Procedure: Repositioning of Right Lumbar three Right lumbar  four  pedicle screw with MAZOR;  Surgeon: Kristeen Miss, MD;  Location: [facility];  Service: Neurosurgery;  Laterality: Right;  . HYSTEROSCOPY W/D&C  07/17/2010   with exc. endometrial polyps  . KNEE ARTHROSCOPY Right 03/07/2003; 01/14/2005; 08/31/2006  . KNEE ARTHROSCOPY Left 03/21/2003; 11/26/2004  . LUMBAR PERCUTANEOUS PEDICLE SCREW 3 LEVEL N/A 02/17/2018   Procedure: LUMBAR PERCUTANEOUS PEDICLE SCREW PLACEMENT LUMBAR THREE-SACRAL ONE;  Surgeon: Kristeen Miss, MD;  Location: [facility];  Service: Neurosurgery;  Laterality: N/A;  . SHOULDER ARTHROSCOPY Right    . TOE SURGERY Left 01/10/2003   claw toe correction 2nd, 3rd, 4th toes  . TOTAL HIP ARTHROPLASTY Right 04/21/2016  . TOTAL HIP ARTHROPLASTY Right 04/21/2016   Procedure: TOTAL HIP ARTHROPLASTY ANTERIOR APPROACH;  Surgeon: Ninetta Lights, MD;  Location: [facility];  Service: Orthopedics;  Laterality: Right;  . TOTAL KNEE ARTHROPLASTY Left 01/21/2010  . TOTAL KNEE ARTHROPLASTY Right 08/26/2008    There were no vitals filed for this visit.  Subjective Assessment - 10/26/18 0853    Subjective  Saw Dr. Ellene Route yesterday. He said the xray showed the hardware is intact. Getting a CT scan before 2 month followup due to pt reporting pain and numbness with sitting in buttocks area. No falls.    Pertinent History  Past Surgical Hx: B TKA (2009, 2011) and R THA 2017. PMH includes; OA, Morbid Obesity, Depression, HTN, Lumbar Spondylosis, Polycystic Ovarian Syndrome, Pneumonia, and PONV    Limitations  Standing;Walking;Lifting;Sitting    How long can you sit comfortably?  Sitting is uncomfortable. No amount of time specified.     How long can you stand comfortably?  about 5-10 mins    How long can you walk comfortably?  Short period of time with no time period specified    Patient Stated Goals  Reports goal of being more mobile with increased flexibility. Wants to "atleast put lotion on her feet."    Currently in Pain?  Yes    Pain Score  5     Pain Location  Generalized    Pain Descriptors / Indicators  Sore;Aching;Sharp   sharp at times between shoulder  blades   Pain Type  Chronic pain    Pain Onset  More than a month ago    Pain Frequency  Constant    Aggravating Factors   walking    Pain Relieving Factors  resting         [facility] PT Assessment - 10/26/18 0857      Transfers   Transfers  Sit to Stand;Stand to Sit    Sit to Stand  6: Modified independent (Device/Increase time)    Five time sit to stand comments   10.68 sec's no UE support from standard height of chair (mat lowered to that level as pt  was seated at edge of mat for testing)    Stand to Sit  6: Modified independent (Device/Increase time)      Ambulation/Gait   Ambulation/Gait  Yes    Ambulation/Gait Assistance  6: Modified independent (Device/Increase time);5: Supervision    Ambulation Distance (Feet)  --   around gym with testing   Assistive device  None    Gait Pattern  Step-through pattern    Ambulation Surface  Level;Indoor    Gait velocity  9.62 sec's= 3.40 ft/sec no AD    Stairs  Yes    Stairs Assistance  5: Supervision;4: Min guard    Stairs Assistance Details (indicate cue type and reason)  intermittent touch to bars  for balance with reciprocal pattern negotiating up/down stairs    Stair Management Technique  No rails;Alternating pattern;Forwards    Number of Stairs  4      Functional Gait  Assessment   Gait assessed   Yes    Gait Level Surface  Walks 20 ft in less than 7 sec but greater than 5.5 sec, uses assistive device, slower speed, mild gait deviations, or deviates 6-10 in outside of the 12 in walkway width.   6.43 sec's   Change in Gait Speed  Able to smoothly change walking speed without loss of balance or gait deviation. Deviate no more than 6 in outside of the 12 in walkway width.    Gait with Horizontal Head Turns  Performs head turns smoothly with no change in gait. Deviates no more than 6 in outside 12 in walkway width    Gait with Vertical Head Turns  Performs head turns with no change in gait. Deviates no more than 6 in outside 12 in walkway width.    Gait and Pivot Turn  Pivot turns safely in greater than 3 sec and stops with no loss of balance, or pivot turns safely within 3 sec and stops with mild imbalance, requires small steps to catch balance.    Step Over Obstacle  Is able to step over one shoe box (4.5 in total height) without changing gait speed. No evidence of imbalance.    Gait with Narrow Base of Support  Ambulates 7-9 steps.    Gait with Eyes Closed  Walks 20 ft, slow speed, abnormal  gait pattern, evidence for imbalance, deviates 10-15 in outside 12 in walkway width. Requires more than 9 sec to ambulate 20 ft.    Ambulating Backwards  Walks 20 ft, uses assistive device, slower speed, mild gait deviations, deviates 6-10 in outside 12 in walkway width.    Steps  Alternating feet, must use rail.   intermittent touch to rails   Total Score  22    FGA comment:  19-24 = medium risk fall          [facility] Adult PT Treatment/Exercise - 10/26/18 0857      Knee/Hip Exercises: Aerobic   Other Aerobic  Scifit LE/UE's  for 8 minutes level 6.5 x 8 minutes for strengthening/activity tolerance.           PT Short Term Goals - 08/10/18 0956      PT SHORT TERM GOAL #1   Title  Pt will initiate HEP in order to indicate improved functional mobility and decreased fall risk.  (STG DUE 11/13)    Baseline  met 08/07/18    Time  4    Period  Weeks    Status  Achieved      PT SHORT TERM GOAL #2   Title  Pt will improve gait speed to >/= 2.57 ft/sec with LRAD in order to indicate decreased fall risk     Baseline  10/14: 2.27 ft/ sec with no AD to 3.16 ft/sec without AD on 08/07/18    Time  4    Period  Weeks    Status  Achieved      PT SHORT TERM GOAL #3   Title  Pt will perform 5TSS </=17.57 secs with single UE support only in order to indicate decreased fall risk and improved functional strength.      Baseline  10/14: 22.74 from standard height chair with B UE assist to 16.38 secs without UE support.  Time  4    Period  Weeks    Status  Achieved      PT SHORT TERM GOAL #4   Title  Pt will improve BERG balance score to >/=34/56 in order to indicate decreased fall risk.     Baseline  10/14: 30/56 indicative of high fall risk potential to 51/56 on 08/07/18    Time  4    Period  Weeks    Status  Achieved      PT SHORT TERM GOAL #5   Title  Pt will negotiate up/down 8 steps with bilateral rails with supervision in order to indicate improved LE strength    Baseline  met  with single rail reciprocal pattern 08/07/18    Time  4    Period  Weeks    Status  Achieved      PT SHORT TERM GOAL #6   Title  Pt will ambulate 300' on uneven surfaces, ramps, curbs, inclines, demonstrating ability to vary gait speed when cued using LRAD and therapist providing supervision to improve safety with community distances    met 08/07/18   Time  4    Period  Weeks    Status  Achieved      PT SHORT TERM GOAL #7   Title  Pt will participate in 6 min walk test to assess baseline endurance with functional mobility     Time  4    Period  Weeks    Status  Achieved        PT Long Term Goals - 10/26/18 4709      PT LONG TERM GOAL #1   Title  Pt will be independent with final HEP in order to indicate improved functional mobilty and decreased fall risk.   (LTG DUE 11/02/18)    Baseline  10/24/18: current program is good, still challenging    Status  Achieved      PT LONG TERM GOAL #2   Title  Pt will improve gait speed to >/=4.10 ft/sec w/ LRAD in order to indicate decreased fall risk and safe community ambulator.     Baseline  10/24/18: 3.35 ft/sec no AD , decreased from last assessment (was 3.78 ft/sec)    Status  Not Met      PT LONG TERM GOAL #3   Title  Pt will perform 5TSS in </=13 secs without UE support in order to indicate decreased fall risk and improved functional strength.     Baseline  10/26/18: 10.68 sec's no UE support from standard height (mat lowered to chair height)    Time  --    Period  --    Status  Achieved      PT LONG TERM GOAL #4   Title  Will assess FGA and improve score by 3 points in order to indicate decreased fall risk.     Baseline  10/26/18: 22/30 scored today, was 19/30 at last check    Time  --    Period  --    Status  Achieved      PT LONG TERM GOAL #5   Title  Pt will negotiate 4 steps without HR utilizing reciprocal stepping technique at Mod I level to increase safety with community accessibility.     Baseline  10/26/18: continues to need  intermittent touch to rails with reciprocal pattern with supervision    Time  --    Period  --    Status  Achieved  PT LONG TERM GOAL #6   Title  Pt will ambulate x 500' outdoors/indoors without AD with less antaglic gait pattern in order to indicate improved fluidity of gait and decreased pain.     Baseline  10/26/18: unable to test this due to poor weather conditions    Time  --    Period  --    Status  Unable to assess      PT LONG TERM GOAL #7   Title  Pt will improve 6 min walk test by 150' indicating improvement with functional endurance    10/25: Patient performs 675 with rollator to 818' 08/10/18   Baseline  10/24/18: 1389 feet today with no AD (258 feet increase)    Status  Achieved         PT Long Term Goals - 10/26/18 0856      PT LONG TERM GOAL #1   Title  Pt will be independent with final HEP in order to indicate improved functional mobilty and decreased fall risk.   (UPDATED LTG DUE 12/06/18)    Baseline  10/24/18: current program is good, still challenging    Time  4    Period  Weeks    Status  On-going      PT LONG TERM GOAL #2   Title  Pt will improve gait speed to >/=4.10 ft/sec w/ LRAD in order to indicate decreased fall risk and safe community ambulator.     Baseline  10/24/18: 3.35 ft/sec no AD     Time  4    Period  Weeks    Status  New      PT LONG TERM GOAL #3   Title  Pt will perform 5TSS in </=13 secs without UE support in order to indicate decreased fall risk and improved functional strength.     Baseline  10/26/18: 10.68 sec's no UE support from standard height (mat lowered to chair height)    Time  --    Period  --    Status  Achieved      PT LONG TERM GOAL #4   Title  Pt will improve FGA to >/=25/30 in order to indicate decreased fall risk.     Baseline  10/26/18: 22/30 scored today    Time  4    Period  Weeks    Status  New      PT LONG TERM GOAL #5   Title  Pt will negotiate up/down 8 steps without rail in reciprocal pattern while carrying  item (ie grocery bags) in order to indicate improved safety with home entry/exit.     Baseline  10/26/18: continues to need intermittent touch to rails with reciprocal pattern with supervision    Time  4    Period  Weeks    Status  Revised      PT LONG TERM GOAL #6   Title  Pt will ambulate x 500' outdoors/indoors without AD with less antaglic gait pattern in order to indicate improved fluidity of gait and decreased pain.     Baseline  10/26/18: unable to test this due to poor weather conditions    Time  --    Period  --    Status  On-going      PT LONG TERM GOAL #7   Title  Pt will improve 6 min walk test to1589' indicating improvement with functional endurance    10/25: Patient performs 675 with rollator to 818' 08/10/18   Baseline  10/24/18: 1389  feet today with no AD (258 feet increase)    Time  4    Period  Weeks    Status  Revised        New LTGs updated by:  Cameron Sprang, PT, MPT [facility] [address] [place], [place], [address] Phone: [phone]   Fax:  [phone] 11/06/18, 5:15 PM    Plan - 10/26/18 0855    Clinical Impression Statement  Today's skilled session focused on progress toward remaining LTGs with some goals met, others showing progress. Pt with increased pain today, states she is unsure why. Increased with walking tasks for goal check. Ended session on Scifti to work on strengthening/activity tolerance without excerbating pain. No increase in pain after Scifit. Primary PT to recert.       Rehab Potential  Good    Clinical Impairments Affecting Rehab Potential  Pt is motivated to improve. Strength gains may be limited 2/2 to nerve regeneration process     PT Frequency  2x / week    PT Duration  4 weeks    PT Treatment/Interventions  ADLs/Self Care Home Management;Aquatic Therapy;DME Instruction;Gait training;Stair training;Functional mobility training;Therapeutic activities;Therapeutic exercise;Balance  training;Neuromuscular re-education;Patient/family education;Electrical Stimulation;Passive range of motion;Energy conservation;Manual techniques;Dry needling    PT Next Visit Plan  continue with dry neeedling as able, continue with high level balance/agility drills/plyometric drills    PT Home Exercise Plan   [id number]     Consulted and Agree with Plan of Care  Patient       Patient will benefit from skilled therapeutic intervention in order to improve the following deficits and impairments:  Abnormal gait, Decreased activity tolerance, Decreased balance, Decreased coordination, Decreased endurance, Decreased mobility, Decreased range of motion, Decreased strength, Impaired perceived functional ability, Impaired flexibility, Impaired sensation, Pain, Decreased knowledge of use of DME, Decreased knowledge of precautions, Obesity  Visit Diagnosis: Muscle weakness (generalized)  Unsteadiness on feet  Chronic midline low back pain without sciatica     Problem List Patient Active Problem List   Diagnosis Date Noted  . Lumbosacral radiculopathy at L4 06/09/2018  . Generalized anxiety disorder   . Pain   . Fall   . Labile blood pressure   . Neuropathic pain   . Therapeutic opioid induced constipation   . Urinary retention   . Lumbar radiculopathy 02/21/2018  . PCOS (polycystic ovarian syndrome)   . Morbid obesity ([place])   . Depression   . Generalized OA   . Postoperative pain   . Acute blood loss anemia   . Status post surgery 02/17/2018  . Lumbar radiculopathy, chronic 02/17/2018  . Primary localized osteoarthritis of right hip 04/21/2016  . Sinusitis 06/25/2015  . Cough 06/25/2015  . Essential hypertension 04/03/2015  . Dehydration 04/03/2015  . CAP (community acquired pneumonia) 03/31/2015  . Right lower lobe pneumonia ([place]) 03/31/2015  . Fever 03/31/2015  . Pregnancy 04/29/2014  . Miscarriage   . Incomplete miscarriage 04/02/2011    Class: Acute  . 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

## 2018-10-29 NOTE — Progress Notes (Signed)
   Subjective: Patient presents today 2 weeks post ingrown nail permanent nail avulsion procedure. Patient states that the toe and nail fold is feeling much better.  Patient does have a new complaint today regarding symptomatic hammertoes to the bilateral feet.  Patient has had previous hammertoe corrective surgery however she has had recurrent hammertoes.  Conservative modalities including shoe gear modifications have been unsuccessful to provide any sort of satisfactory alleviation of symptoms for the patient.  Last visit on 10/09/2018 surgical authorization and consent was obtained for hammertoe arthrodesis with screw fixation digits 3, 4 right foot.  Patient also has a symptomatic hammertoe today to the second digit left foot which we should she would like addressed.  She presents for further treatment evaluation  Past Medical History:  Diagnosis Date  . Arthritis    knees, back  . 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 Complication of anesthesia   . Depression   . History of pneumonia   . Hypertension   . Lumbar spondylosis   . PCOS (polycystic ovarian syndrome)   . Pneumonia    2016  . PONV (postoperative nausea and vomiting)     Objective: Skin is warm, dry and supple. Nail and respective nail fold appears to be healing appropriately. Open wound to the associated nail fold with a granular wound base and moderate amount of fibrotic tissue. Minimal drainage noted. Mild erythema around the periungual region likely due to phenol chemical matricectomy.  Hammertoe contracture digits 3, 4 right foot noted.  DIPJ hammertoe contracture also noted digit #2 left foot.  Assessment: #1 postop permanent partial nail avulsion medial aspect of the right hallux #2  Hammertoes digits 3, 4 right foot. 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  Plan of care: #1 patient was evaluated  #2 debridement of open wound was performed to the periungual border of the respective toe using a  currette. Antibiotic ointment and Band-Aid was applied. #3  In addition to authorization for surgical consent regarding hammertoes 3, 4 right foot with internal screw fixation, today we modify the authorization to include DIPJ arthroplasty with possible screw fixation second digit left foot.  Possible removal of hardware left.  All possible complications details the procedure were explained #4 patient is scheduled for surgery in March 2020. #5 return to clinic 1 week postop   Edrick Kins, DPM [facility]  Dr. Edrick Kins, [address]                                        [place], [place] [address]                Office [phone]  Fax [phone]

## 2018-11-06 NOTE — Addendum Note (Signed)
Addended by: Cameron Sprang A on: 11/06/2018 05:17 PM   Modules accepted: Orders

## 2018-11-08 ENCOUNTER — Ambulatory Visit: Payer: Medicare Other | Admitting: Physical Therapy

## 2018-11-10 ENCOUNTER — Ambulatory Visit: Payer: Medicare Other | Admitting: Physical Therapy

## 2018-11-14 ENCOUNTER — Ambulatory Visit: Payer: Medicare Other | Admitting: Rehabilitation

## 2018-11-14 NOTE — Therapy (Signed)
[facility] [address] [place], [place], [address] Phone: [phone]   Fax:  [phone]  Patient Details  Name: Ashley [name] MRN: [id number] Date of Birth: Jun 23, 1976 Referring Provider:  Filiberto Pinks  Encounter Date: 11/14/2018   Pt arrived for visit today not feeling well.  Pt with no fever but was audibly congested.  Based on current POC, all exercise would be high level and likely not good for pt to participate in, therefore will not do therapy today.  She reports she is finishing z-pack today however doesn't feel any better.  Recommended she go to urgent care to get re-assessed.  She did feel that she could come tomorrow for dry needling and stretching, therefore kept on schedule for tomorrow.  Pt will let us know about next week.  POC good until 12/06/2018  Cameron Sprang, PT, MPT [facility] [address] [place], [place], [address] Phone: [phone]   Fax:  [phone] 11/14/18, 11:14 AM

## 2018-11-15 ENCOUNTER — Ambulatory Visit: Payer: Medicare Other | Admitting: Physical Therapy

## 2018-11-15 ENCOUNTER — Encounter: Payer: Self-pay | Admitting: Physical Therapy

## 2018-11-15 DIAGNOSIS — G8929 Other chronic pain: Secondary | ICD-10-CM

## 2018-11-15 DIAGNOSIS — R2681 Unsteadiness on feet: Secondary | ICD-10-CM

## 2018-11-15 DIAGNOSIS — M6281 Muscle weakness (generalized): Secondary | ICD-10-CM

## 2018-11-15 DIAGNOSIS — M545 Low back pain, unspecified: Secondary | ICD-10-CM

## 2018-11-15 NOTE — Therapy (Signed)
[facility] [address] [place], [place], [address] Phone: [phone]   Fax:  [phone]  Physical Therapy Treatment  Patient Details  Name: Ashley [name] MRN: [id number] Date of Birth: 11-10-1975 Referring Provider (PT): Dr. Kristeen Miss    Encounter Date: 11/15/2018  PT End of Session - 11/15/18 0941    Visit Number  22    Number of Visits  29   Per updated POC   Date for PT Re-Evaluation  12/06/18   per updated POC   Authorization Type  [facility] 10th visit progress note    PT Start Time  0935    PT Stop Time  1015    PT Time Calculation (min)  40 min    Equipment Utilized During Treatment  Gait belt    Activity Tolerance  Patient tolerated treatment well    Behavior During Therapy  [facility] for tasks assessed/performed       Past Medical History:  Diagnosis Date  . Arthritis    knees, back  . 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 Complication of anesthesia   . Depression   . History of pneumonia   . Hypertension   . Lumbar spondylosis   . PCOS (polycystic ovarian syndrome)   . Pneumonia    2016  . PONV (postoperative nausea and vomiting)     Past Surgical History:  Procedure Laterality Date  . ABDOMINAL EXPOSURE N/A 02/17/2018   Procedure: ABDOMINAL EXPOSURE;  Surgeon: Rosetta Posner, MD;  Location: [facility];  Service: Vascular;  Laterality: N/A;  . ANKLE SURGERY     x 2  . ANTERIOR LAT LUMBAR FUSION N/A 02/17/2018   Procedure: Lumbar three-four Lumbar four-five  Anterolateral lumbar interbody fusion;  Surgeon: Kristeen Miss, MD;  Location: [facility];  Service: Neurosurgery;  Laterality: N/A;  . ANTERIOR LUMBAR FUSION N/A 02/17/2018   Procedure: Lumbar five-Sacral one Anterior lumbar interbody fusion;  Surgeon: Kristeen Miss, MD;  Location: [facility];  Service: Neurosurgery;  Laterality: N/A;  . APPLICATION OF ROBOTIC ASSISTANCE FOR SPINAL PROCEDURE N/A 02/17/2018   Procedure: APPLICATION OF  ROBOTIC ASSISTANCE FOR SPINAL PROCEDURE;  Surgeon: Kristeen Miss, MD;  Location: [facility];  Service: Neurosurgery;  Laterality: N/A;  . APPLICATION OF ROBOTIC ASSISTANCE FOR SPINAL PROCEDURE N/A 06/09/2018   Procedure: APPLICATION OF ROBOTIC ASSISTANCE FOR SPINAL PROCEDURE;  Surgeon: Kristeen Miss, MD;  Location: [facility];  Service: Neurosurgery;  Laterality: N/A;  . BACK SURGERY     2017  discectomy  . CARPAL TUNNEL RELEASE Left 07/04/2014   Procedure: LEFT CARPAL TUNNEL RELEASE;  Surgeon: Ninetta Lights, MD;  Location: [facility];  Service: Orthopedics;  Laterality: Left;  . CARPAL TUNNEL RELEASE Right 08/08/2014   Procedure: RIGHT CARPAL TUNNEL RELEASE;  Surgeon: Ninetta Lights, MD;  Location: [facility];  Service: Orthopedics;  Laterality: Right;  . DILATION AND CURETTAGE OF UTERUS    . DILATION AND EVACUATION  04/02/2011   Procedure: DILATATION AND EVACUATION (D&E);  Surgeon: Luz Lex, MD;  Location: [facility];  Service: Gynecology;  Laterality: N/A;  dvt left mid thigh  . DORSAL COMPARTMENT RELEASE Left 07/04/2014   Procedure: LEFT DEQUERVAINS;  Surgeon: Ninetta Lights, MD;  Location: [facility];  Service: Orthopedics;  Laterality: Left;  . ENDOSCOPIC PLANTAR FASCIOTOMY Left 02/01/2002  . HARDWARE REMOVAL Right 06/09/2018   Procedure: Repositioning of Right Lumbar three Right lumbar  four pedicle screw with  MAZOR;  Surgeon: Kristeen Miss, MD;  Location: [facility];  Service: Neurosurgery;  Laterality: Right;  . HYSTEROSCOPY W/D&C  07/17/2010   with exc. endometrial polyps  . KNEE ARTHROSCOPY Right 03/07/2003; 01/14/2005; 08/31/2006  . KNEE ARTHROSCOPY Left 03/21/2003; 11/26/2004  . LUMBAR PERCUTANEOUS PEDICLE SCREW 3 LEVEL N/A 02/17/2018   Procedure: LUMBAR PERCUTANEOUS PEDICLE SCREW PLACEMENT LUMBAR THREE-SACRAL ONE;  Surgeon: Kristeen Miss, MD;  Location: [facility];  Service: Neurosurgery;  Laterality: N/A;  . SHOULDER ARTHROSCOPY Right   . TOE SURGERY Left  01/10/2003   claw toe correction 2nd, 3rd, 4th toes  . TOTAL HIP ARTHROPLASTY Right 04/21/2016  . TOTAL HIP ARTHROPLASTY Right 04/21/2016   Procedure: TOTAL HIP ARTHROPLASTY ANTERIOR APPROACH;  Surgeon: Ninetta Lights, MD;  Location: [facility];  Service: Orthopedics;  Laterality: Right;  . TOTAL KNEE ARTHROPLASTY Left 01/21/2010  . TOTAL KNEE ARTHROPLASTY Right 08/26/2008    There were no vitals filed for this visit.  Subjective Assessment - 11/15/18 0939    Subjective  doing well - feeling better; has a MD appt at 1115 today    Pertinent History  Past Surgical Hx: B TKA (2009, 2011) and R THA 2017. PMH includes; OA, Morbid Obesity, Depression, HTN, Lumbar Spondylosis, Polycystic Ovarian Syndrome, Pneumonia, and PONV    Limitations  Standing;Walking;Lifting;Sitting    How long can you sit comfortably?  Sitting is uncomfortable. No amount of time specified.     How long can you stand comfortably?  about 5-10 mins    How long can you walk comfortably?  Short period of time with no time period specified    Patient Stated Goals  Reports goal of being more mobile with increased flexibility. Wants to "atleast put lotion on her feet."    Currently in Pain?  Yes    Pain Score  3     Pain Location  Hip    Pain Orientation  Right    Pain Descriptors / Indicators  Aching;Discomfort    Pain Type  Chronic pain    Pain Onset  More than a month ago    Pain Frequency  Constant                       [facility] Adult PT Treatment/Exercise - 11/15/18 0001      Knee/Hip Exercises: Stretches   Other Knee/Hip Stretches  seated on blue physioball: green tband rows 2 x 15; alternating LE march with 2# 2 x 20; alternating LAQ with 2# 2 x 10; overhead press 3# 2 x 10; alternating UE/LE lifts 3# at UE and 2# at LE 2 x 10; paloff press with green tband x 15 reps each side      Knee/Hip Exercises: Aerobic   Tread Mill  1.9 mph x 6 min for cardiovascular endurance - much heavier step with L LE as compared to  R LE               PT Short Term Goals - 08/10/18 0956      PT SHORT TERM GOAL #1   Title  Pt will initiate HEP in order to indicate improved functional mobility and decreased fall risk.  (STG DUE 11/13)    Baseline  met 08/07/18    Time  4    Period  Weeks    Status  Achieved      PT SHORT TERM GOAL #2   Title  Pt will improve gait speed to >/= 2.57 ft/sec with LRAD  in order to indicate decreased fall risk     Baseline  10/14: 2.27 ft/ sec with no AD to 3.16 ft/sec without AD on 08/07/18    Time  4    Period  Weeks    Status  Achieved      PT SHORT TERM GOAL #3   Title  Pt will perform 5TSS </=17.57 secs with single UE support only in order to indicate decreased fall risk and improved functional strength.      Baseline  10/14: 22.74 from standard height chair with B UE assist to 16.38 secs without UE support.     Time  4    Period  Weeks    Status  Achieved      PT SHORT TERM GOAL #4   Title  Pt will improve BERG balance score to >/=34/56 in order to indicate decreased fall risk.     Baseline  10/14: 30/56 indicative of high fall risk potential to 51/56 on 08/07/18    Time  4    Period  Weeks    Status  Achieved      PT SHORT TERM GOAL #5   Title  Pt will negotiate up/down 8 steps with bilateral rails with supervision in order to indicate improved LE strength    Baseline  met with single rail reciprocal pattern 08/07/18    Time  4    Period  Weeks    Status  Achieved      PT SHORT TERM GOAL #6   Title  Pt will ambulate 300' on uneven surfaces, ramps, curbs, inclines, demonstrating ability to vary gait speed when cued using LRAD and therapist providing supervision to improve safety with community distances    met 08/07/18   Time  4    Period  Weeks    Status  Achieved      PT SHORT TERM GOAL #7   Title  Pt will participate in 6 min walk test to assess baseline endurance with functional mobility     Time  4    Period  Weeks    Status  Achieved        PT  Long Term Goals - 10/26/18 3254      PT LONG TERM GOAL #1   Title  Pt will be independent with final HEP in order to indicate improved functional mobilty and decreased fall risk.   (UPDATED LTG DUE 12/06/18)    Baseline  10/24/18: current program is good, still challenging    Time  4    Period  Weeks    Status  On-going      PT LONG TERM GOAL #2   Title  Pt will improve gait speed to >/=4.10 ft/sec w/ LRAD in order to indicate decreased fall risk and safe community ambulator.     Baseline  10/24/18: 3.35 ft/sec no AD     Time  4    Period  Weeks    Status  New      PT LONG TERM GOAL #3   Title  Pt will perform 5TSS in </=13 secs without UE support in order to indicate decreased fall risk and improved functional strength.     Baseline  10/26/18: 10.68 sec's no UE support from standard height (mat lowered to chair height)    Time  --    Period  --    Status  Achieved      PT LONG TERM GOAL #4   Title  Pt  will improve FGA to >/=25/30 in order to indicate decreased fall risk.     Baseline  10/26/18: 22/30 scored today    Time  4    Period  Weeks    Status  New      PT LONG TERM GOAL #5   Title  Pt will negotiate up/down 8 steps without rail in reciprocal pattern while carrying item (ie grocery bags) in order to indicate improved safety with home entry/exit.     Baseline  10/26/18: continues to need intermittent touch to rails with reciprocal pattern with supervision    Time  4    Period  Weeks    Status  Revised      PT LONG TERM GOAL #6   Title  Pt will ambulate x 500' outdoors/indoors without AD with less antaglic gait pattern in order to indicate improved fluidity of gait and decreased pain.     Baseline  10/26/18: unable to test this due to poor weather conditions    Time  --    Period  --    Status  On-going      PT LONG TERM GOAL #7   Title  Pt will improve 6 min walk test to1589' indicating improvement with functional endurance    10/25: Patient performs 675 with rollator to 818'  08/10/18   Baseline  10/24/18: 1389 feet today with no AD (258 feet increase)    Time  4    Period  Weeks    Status  Revised            Plan - 11/15/18 0941    Clinical Impression Statement  Patient with subjective reports of feeling better, however overall fatigued due to recent illness. Session today focusing on posturing and core engagement as patient reports Dr. Ellene Route wants her working on these aspects. Majority of session spent on physioball to engage core - requires cueing thorughout to maintain upright. Easily fatigues with all activities requiring rest breaks. Reports surgery scheduled for hammer toes on 3/12. Will continue to progress as patient tolerates.     Rehab Potential  Good    Clinical Impairments Affecting Rehab Potential  Pt is motivated to improve. Strength gains may be limited 2/2 to nerve regeneration process     PT Frequency  2x / week    PT Duration  4 weeks    PT Treatment/Interventions  ADLs/Self Care Home Management;Aquatic Therapy;DME Instruction;Gait training;Stair training;Functional mobility training;Therapeutic activities;Therapeutic exercise;Balance training;Neuromuscular re-education;Patient/family education;Electrical Stimulation;Passive range of motion;Energy conservation;Manual techniques;Dry needling;Vestibular    PT Next Visit Plan  continue with dry neeedling as able, continue with high level balance/agility drills/plyometric drills    PT Home Exercise Plan   [id number]     Consulted and Agree with Plan of Care  Patient       Patient will benefit from skilled therapeutic intervention in order to improve the following deficits and impairments:  Abnormal gait, Decreased activity tolerance, Decreased balance, Decreased coordination, Decreased endurance, Decreased mobility, Decreased range of motion, Decreased strength, Impaired perceived functional ability, Impaired flexibility, Impaired sensation, Pain, Decreased knowledge of use of DME, Decreased  knowledge of precautions, Obesity  Visit Diagnosis: Muscle weakness (generalized)  Unsteadiness on feet  Chronic midline low back pain without sciatica     Problem List Patient Active Problem List   Diagnosis Date Noted  . Lumbosacral radiculopathy at L4 06/09/2018  . Generalized anxiety disorder   . Pain   . Fall   . Labile blood pressure   .  Neuropathic pain   . Therapeutic opioid induced constipation   . Urinary retention   . Lumbar radiculopathy 02/21/2018  . PCOS (polycystic ovarian syndrome)   . Morbid obesity ([place])   . Depression   . Generalized OA   . Postoperative pain   . Acute blood loss anemia   . Status post surgery 02/17/2018  . Lumbar radiculopathy, chronic 02/17/2018  . Primary localized osteoarthritis of right hip 04/21/2016  . Sinusitis 06/25/2015  . Cough 06/25/2015  . Essential hypertension 04/03/2015  . Dehydration 04/03/2015  . CAP (community acquired pneumonia) 03/31/2015  . Right lower lobe pneumonia ([place]) 03/31/2015  . Fever 03/31/2015  . Pregnancy 04/29/2014  . Miscarriage   . Incomplete miscarriage 04/02/2011    Class: Acute  . Deep vein thrombosis (DVT) ([place]) 04/02/2011    Class: Chronic     Lanney Gins, PT, DPT Supplemental Physical Therapist 11/15/18 11:45 AM Pager: [phone] Office: [facility] [address] [place], [place], [address] Phone: [phone]   Fax:  [phone]  Name: MIRRA BASILIO MRN: [id number] Date of Birth: 10-01-1975

## 2018-11-22 ENCOUNTER — Ambulatory Visit: Payer: Medicare Other | Attending: Neurological Surgery | Admitting: Physical Therapy

## 2018-11-22 ENCOUNTER — Encounter: Payer: Self-pay | Admitting: Physical Therapy

## 2018-11-22 DIAGNOSIS — M545 Low back pain, unspecified: Secondary | ICD-10-CM

## 2018-11-22 DIAGNOSIS — R2681 Unsteadiness on feet: Secondary | ICD-10-CM | POA: Insufficient documentation

## 2018-11-22 DIAGNOSIS — M6281 Muscle weakness (generalized): Secondary | ICD-10-CM | POA: Diagnosis not present

## 2018-11-22 DIAGNOSIS — G8929 Other chronic pain: Secondary | ICD-10-CM | POA: Diagnosis present

## 2018-11-22 NOTE — Therapy (Signed)
[facility] [address] [place], [place], [address] Phone: [phone]   Fax:  [phone]  Physical Therapy Treatment  Patient Details  Name: Ashley [name] MRN: [id number] Date of Birth: 30-Mar-1976 Referring Provider (PT): Dr. Kristeen Miss    Encounter Date: 11/22/2018  PT End of Session - 11/22/18 1156    Visit Number  23    Number of Visits  29   Per updated POC   Date for PT Re-Evaluation  12/06/18   per updated POC   Authorization Type  [place] 10th visit progress note    PT Start Time  1148    PT Stop Time  1231    PT Time Calculation (min)  43 min    Equipment Utilized During Treatment  --    Activity Tolerance  Patient tolerated treatment well    Behavior During Therapy  [facility] for tasks assessed/performed       Past Medical History:  Diagnosis Date  . Arthritis    knees, back  . 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 Complication of anesthesia   . Depression   . History of pneumonia   . Hypertension   . Lumbar spondylosis   . PCOS (polycystic ovarian syndrome)   . Pneumonia    2016  . PONV (postoperative nausea and vomiting)     Past Surgical History:  Procedure Laterality Date  . ABDOMINAL EXPOSURE N/A 02/17/2018   Procedure: ABDOMINAL EXPOSURE;  Surgeon: Rosetta Posner, MD;  Location: [facility] OR;  Service: Vascular;  Laterality: N/A;  . ANKLE SURGERY     x 2  . ANTERIOR LAT LUMBAR FUSION N/A 02/17/2018   Procedure: Lumbar three-four Lumbar four-five  Anterolateral lumbar interbody fusion;  Surgeon: Kristeen Miss, MD;  Location: [facility];  Service: Neurosurgery;  Laterality: N/A;  . ANTERIOR LUMBAR FUSION N/A 02/17/2018   Procedure: Lumbar five-Sacral one Anterior lumbar interbody fusion;  Surgeon: Kristeen Miss, MD;  Location: [facility];  Service: Neurosurgery;  Laterality: N/A;  . APPLICATION OF ROBOTIC ASSISTANCE FOR SPINAL PROCEDURE N/A 02/17/2018   Procedure: APPLICATION OF ROBOTIC  ASSISTANCE FOR SPINAL PROCEDURE;  Surgeon: Kristeen Miss, MD;  Location: [facility];  Service: Neurosurgery;  Laterality: N/A;  . APPLICATION OF ROBOTIC ASSISTANCE FOR SPINAL PROCEDURE N/A 06/09/2018   Procedure: APPLICATION OF ROBOTIC ASSISTANCE FOR SPINAL PROCEDURE;  Surgeon: Kristeen Miss, MD;  Location: [facility];  Service: Neurosurgery;  Laterality: N/A;  . BACK SURGERY     2017  discectomy  . CARPAL TUNNEL RELEASE Left 07/04/2014   Procedure: LEFT CARPAL TUNNEL RELEASE;  Surgeon: Ninetta Lights, MD;  Location: [facility];  Service: Orthopedics;  Laterality: Left;  . CARPAL TUNNEL RELEASE Right 08/08/2014   Procedure: RIGHT CARPAL TUNNEL RELEASE;  Surgeon: Ninetta Lights, MD;  Location: [facility];  Service: Orthopedics;  Laterality: Right;  . DILATION AND CURETTAGE OF UTERUS    . DILATION AND EVACUATION  04/02/2011   Procedure: DILATATION AND EVACUATION (D&E);  Surgeon: Luz Lex, MD;  Location: [facility];  Service: Gynecology;  Laterality: N/A;  dvt left mid thigh  . DORSAL COMPARTMENT RELEASE Left 07/04/2014   Procedure: LEFT DEQUERVAINS;  Surgeon: Ninetta Lights, MD;  Location: [facility];  Service: Orthopedics;  Laterality: Left;  . ENDOSCOPIC PLANTAR FASCIOTOMY Left 02/01/2002  . HARDWARE REMOVAL Right 06/09/2018   Procedure: Repositioning of Right Lumbar three Right lumbar  four pedicle screw with MAZOR;  Surgeon: Kristeen Miss, MD;  Location: [facility];  Service: Neurosurgery;  Laterality: Right;  . HYSTEROSCOPY W/D&C  07/17/2010   with exc. endometrial polyps  . KNEE ARTHROSCOPY Right 03/07/2003; 01/14/2005; 08/31/2006  . KNEE ARTHROSCOPY Left 03/21/2003; 11/26/2004  . LUMBAR PERCUTANEOUS PEDICLE SCREW 3 LEVEL N/A 02/17/2018   Procedure: LUMBAR PERCUTANEOUS PEDICLE SCREW PLACEMENT LUMBAR THREE-SACRAL ONE;  Surgeon: Kristeen Miss, MD;  Location: [facility];  Service: Neurosurgery;  Laterality: N/A;  . SHOULDER ARTHROSCOPY Right   . TOE SURGERY Left 01/10/2003    claw toe correction 2nd, 3rd, 4th toes  . TOTAL HIP ARTHROPLASTY Right 04/21/2016  . TOTAL HIP ARTHROPLASTY Right 04/21/2016   Procedure: TOTAL HIP ARTHROPLASTY ANTERIOR APPROACH;  Surgeon: Ninetta Lights, MD;  Location: [facility];  Service: Orthopedics;  Laterality: Right;  . TOTAL KNEE ARTHROPLASTY Left 01/21/2010  . TOTAL KNEE ARTHROPLASTY Right 08/26/2008    There were no vitals filed for this visit.  Subjective Assessment - 11/22/18 1154    Subjective  feeling much better - had Medicare home visit this week - feels like she had gained ~50 lbs since surgery - wants to be more active    Pertinent History  Past Surgical Hx: B TKA (2009, 2011) and R THA 2017. PMH includes; OA, Morbid Obesity, Depression, HTN, Lumbar Spondylosis, Polycystic Ovarian Syndrome, Pneumonia, and PONV    Limitations  Standing;Walking;Lifting;Sitting    How long can you sit comfortably?  Sitting is uncomfortable. No amount of time specified.     How long can you stand comfortably?  about 5-10 mins    How long can you walk comfortably?  Short period of time with no time period specified    Patient Stated Goals  Reports goal of being more mobile with increased flexibility. Wants to "atleast put lotion on her feet."    Currently in Pain?  No/denies    Pain Score  0-No pain    Pain Onset  More than a month ago                       [facility] Adult PT Treatment/Exercise - 11/22/18 0001      Knee/Hip Exercises: Aerobic   Tread Mill  1.6 up to 2.0 mph x 10 min for cardiovascular endurance - completed 0.29 miles      Knee/Hip Exercises: Standing   Wall Squat  1 set;15 reps    Wall Squat Limitations  cueing for depth + core activation    Other Standing Knee Exercises  sidestepping 2 x 20 feet each direction - green tband at ankles + mini squat      Knee/Hip Exercises: Supine   Bridges  Strengthening;Both;15 reps   green tband at [employer] Limitations  progression to bridge with march x 15 reps       Knee/Hip Exercises: Prone   Other Prone Exercises  modified quadruped - B hip extension with target LE from floor to extension x 15 reps B                PT Short Term Goals - 08/10/18 0956      PT SHORT TERM GOAL #1   Title  Pt will initiate HEP in order to indicate improved functional mobility and decreased fall risk.  (STG DUE 11/13)    Baseline  met 08/07/18    Time  4    Period  Weeks    Status  Achieved      PT SHORT  TERM GOAL #2   Title  Pt will improve gait speed to >/= 2.57 ft/sec with LRAD in order to indicate decreased fall risk     Baseline  10/14: 2.27 ft/ sec with no AD to 3.16 ft/sec without AD on 08/07/18    Time  4    Period  Weeks    Status  Achieved      PT SHORT TERM GOAL #3   Title  Pt will perform 5TSS </=17.57 secs with single UE support only in order to indicate decreased fall risk and improved functional strength.      Baseline  10/14: 22.74 from standard height chair with B UE assist to 16.38 secs without UE support.     Time  4    Period  Weeks    Status  Achieved      PT SHORT TERM GOAL #4   Title  Pt will improve BERG balance score to >/=34/56 in order to indicate decreased fall risk.     Baseline  10/14: 30/56 indicative of high fall risk potential to 51/56 on 08/07/18    Time  4    Period  Weeks    Status  Achieved      PT SHORT TERM GOAL #5   Title  Pt will negotiate up/down 8 steps with bilateral rails with supervision in order to indicate improved LE strength    Baseline  met with single rail reciprocal pattern 08/07/18    Time  4    Period  Weeks    Status  Achieved      PT SHORT TERM GOAL #6   Title  Pt will ambulate 300' on uneven surfaces, ramps, curbs, inclines, demonstrating ability to vary gait speed when cued using LRAD and therapist providing supervision to improve safety with community distances    met 08/07/18   Time  4    Period  Weeks    Status  Achieved      PT SHORT TERM GOAL #7   Title  Pt will participate in  6 min walk test to assess baseline endurance with functional mobility     Time  4    Period  Weeks    Status  Achieved        PT Long Term Goals - 10/26/18 7116      PT LONG TERM GOAL #1   Title  Pt will be independent with final HEP in order to indicate improved functional mobilty and decreased fall risk.   (UPDATED LTG DUE 12/06/18)    Baseline  10/24/18: current program is good, still challenging    Time  4    Period  Weeks    Status  On-going      PT LONG TERM GOAL #2   Title  Pt will improve gait speed to >/=4.10 ft/sec w/ LRAD in order to indicate decreased fall risk and safe community ambulator.     Baseline  10/24/18: 3.35 ft/sec no AD     Time  4    Period  Weeks    Status  New      PT LONG TERM GOAL #3   Title  Pt will perform 5TSS in </=13 secs without UE support in order to indicate decreased fall risk and improved functional strength.     Baseline  10/26/18: 10.68 sec's no UE support from standard height (mat lowered to chair height)    Time  --    Period  --  Status  Achieved      PT LONG TERM GOAL #4   Title  Pt will improve FGA to >/=25/30 in order to indicate decreased fall risk.     Baseline  10/26/18: 22/30 scored today    Time  4    Period  Weeks    Status  New      PT LONG TERM GOAL #5   Title  Pt will negotiate up/down 8 steps without rail in reciprocal pattern while carrying item (ie grocery bags) in order to indicate improved safety with home entry/exit.     Baseline  10/26/18: continues to need intermittent touch to rails with reciprocal pattern with supervision    Time  4    Period  Weeks    Status  Revised      PT LONG TERM GOAL #6   Title  Pt will ambulate x 500' outdoors/indoors without AD with less antaglic gait pattern in order to indicate improved fluidity of gait and decreased pain.     Baseline  10/26/18: unable to test this due to poor weather conditions    Time  --    Period  --    Status  On-going      PT LONG TERM GOAL #7   Title  Pt  will improve 6 min walk test to1589' indicating improvement with functional endurance    10/25: Patient performs 675 with rollator to 818' 08/10/18   Baseline  10/24/18: 1389 feet today with no AD (258 feet increase)    Time  4    Period  Weeks    Status  Revised            Plan - 11/22/18 1157    Clinical Impression Statement  Session today focusing on hip strengthening - noted heavy step while on treadmill with tendency to quickly offload R LE - unsure if this pattern is acquired due to pain vs weakness. Tolerable to all activities but does require cueing for care activation to prevent compensations. Requires frequent rest breaks due to fatigue. Need to discuss hold of therapy at next visit due to upcoming foot surgery.     Rehab Potential  Good    Clinical Impairments Affecting Rehab Potential  Pt is motivated to improve. Strength gains may be limited 2/2 to nerve regeneration process     PT Frequency  2x / week    PT Duration  4 weeks    PT Treatment/Interventions  ADLs/Self Care Home Management;Aquatic Therapy;DME Instruction;Gait training;Stair training;Functional mobility training;Therapeutic activities;Therapeutic exercise;Balance training;Neuromuscular re-education;Patient/family education;Electrical Stimulation;Passive range of motion;Energy conservation;Manual techniques;Dry needling;Vestibular    PT Next Visit Plan  continue with high level balance/agility drills/plyometric drills; hip strengthening    PT Home Exercise Plan   [id number]     Consulted and Agree with Plan of Care  Patient       Patient will benefit from skilled therapeutic intervention in order to improve the following deficits and impairments:  Abnormal gait, Decreased activity tolerance, Decreased balance, Decreased coordination, Decreased endurance, Decreased mobility, Decreased range of motion, Decreased strength, Impaired perceived functional ability, Impaired flexibility, Impaired sensation, Pain, Decreased  knowledge of use of DME, Decreased knowledge of precautions, Obesity  Visit Diagnosis: Muscle weakness (generalized)  Unsteadiness on feet  Chronic midline low back pain without sciatica     Problem List Patient Active Problem List   Diagnosis Date Noted  . Lumbosacral radiculopathy at L4 06/09/2018  . Generalized anxiety disorder   . Pain   .  Fall   . Labile blood pressure   . Neuropathic pain   . Therapeutic opioid induced constipation   . Urinary retention   . Lumbar radiculopathy 02/21/2018  . PCOS (polycystic ovarian syndrome)   . Morbid obesity ([place])   . Depression   . Generalized OA   . Postoperative pain   . Acute blood loss anemia   . Status post surgery 02/17/2018  . Lumbar radiculopathy, chronic 02/17/2018  . Primary localized osteoarthritis of right hip 04/21/2016  . Sinusitis 06/25/2015  . Cough 06/25/2015  . Essential hypertension 04/03/2015  . Dehydration 04/03/2015  . CAP (community acquired pneumonia) 03/31/2015  . Right lower lobe pneumonia ([place]) 03/31/2015  . Fever 03/31/2015  . Pregnancy 04/29/2014  . Miscarriage   . Incomplete miscarriage 04/02/2011    Class: Acute  . Deep vein thrombosis (DVT) ([place]) 04/02/2011    Class: Chronic     Lanney Gins, PT, DPT Supplemental Physical Therapist 11/22/18 12:48 PM Pager: [phone] Office: [facility] [address] [place], [place], [address] Phone: [phone]   Fax:  [phone]  Name: Ashley [name] MRN: [id number] Date of Birth: Jun 24, 1976

## 2018-11-24 ENCOUNTER — Emergency Department (HOSPITAL_COMMUNITY)
Admission: EM | Admit: 2018-11-24 | Discharge: 2018-11-24 | Disposition: A | Discharge status: Home / Self Care | Payer: Medicare Other | Attending: Emergency Medicine | Admitting: Emergency Medicine

## 2018-11-24 ENCOUNTER — Emergency Department (HOSPITAL_COMMUNITY): Payer: Medicare Other

## 2018-11-24 ENCOUNTER — Other Ambulatory Visit: Payer: Self-pay

## 2018-11-24 ENCOUNTER — Ambulatory Visit: Payer: Medicare Other | Admitting: Rehabilitation

## 2018-11-24 ENCOUNTER — Encounter (HOSPITAL_COMMUNITY): Payer: Self-pay | Admitting: Emergency Medicine

## 2018-11-24 DIAGNOSIS — Z96641 Presence of right artificial hip joint: Secondary | ICD-10-CM | POA: Diagnosis not present

## 2018-11-24 DIAGNOSIS — I10 Essential (primary) hypertension: Secondary | ICD-10-CM | POA: Diagnosis not present

## 2018-11-24 DIAGNOSIS — M940 Chondrocostal junction syndrome [Tietze]: Secondary | ICD-10-CM | POA: Diagnosis not present

## 2018-11-24 DIAGNOSIS — Z96653 Presence of artificial knee joint, bilateral: Secondary | ICD-10-CM | POA: Diagnosis not present

## 2018-11-24 DIAGNOSIS — Z79899 Other long term (current) drug therapy: Secondary | ICD-10-CM | POA: Insufficient documentation

## 2018-11-24 DIAGNOSIS — R0789 Other chest pain: Secondary | ICD-10-CM | POA: Diagnosis present

## 2018-11-24 LAB — COMPREHENSIVE METABOLIC PANEL
ALT: 15 U/L (ref 0–44)
AST: 24 U/L (ref 15–41)
Albumin: 3.4 g/dL — ABNORMAL LOW (ref 3.5–5.0)
Alkaline Phosphatase: 93 U/L (ref 38–126)
Anion gap: 6 (ref 5–15)
BUN: 12 mg/dL (ref 6–20)
CO2: 25 mmol/L (ref 22–32)
Calcium: 8.9 mg/dL (ref 8.9–10.3)
Chloride: 103 mmol/L (ref 98–111)
Creatinine, Ser: 0.84 mg/dL (ref 0.44–1.00)
GFR calc Af Amer: >60 mL/min (ref >60)
GFR calc non Af Amer: >60 mL/min (ref >60)
Glucose, Bld: 91 mg/dL (ref 70–99)
Potassium: 4.1 mmol/L (ref 3.5–5.1)
Sodium: 134 mmol/L — ABNORMAL LOW (ref 135–145)
Total Bilirubin: 0.4 mg/dL (ref 0.3–1.2)
Total Protein: 8 g/dL (ref 6.5–8.1)

## 2018-11-24 LAB — CBC WITH DIFFERENTIAL/PLATELET
Abs Immature Granulocytes: 0.02 10*3/uL (ref 0.00–0.07)
Basophils Absolute: 0 10*3/uL (ref 0.0–0.1)
Basophils Relative: 1 %
Eosinophils Absolute: 0.2 10*3/uL (ref 0.0–0.5)
Eosinophils Relative: 4 %
HCT: 34.4 % — ABNORMAL LOW (ref 36.0–46.0)
Hemoglobin: 10.2 g/dL — ABNORMAL LOW (ref 12.0–15.0)
Immature Granulocytes: 0 %
Lymphocytes Relative: 23 %
Lymphs Abs: 1.4 10*3/uL (ref 0.7–4.0)
MCH: 22.5 pg — ABNORMAL LOW (ref 26.0–34.0)
MCHC: 29.7 g/dL — ABNORMAL LOW (ref 30.0–36.0)
MCV: 75.9 fL — ABNORMAL LOW (ref 80.0–100.0)
Monocytes Absolute: 0.6 10*3/uL (ref 0.1–1.0)
Monocytes Relative: 9 %
Neutro Abs: 3.9 10*3/uL (ref 1.7–7.7)
Neutrophils Relative %: 63 %
Platelets: 229 10*3/uL (ref 150–400)
RBC: 4.53 MIL/uL (ref 3.87–5.11)
RDW: 18.3 % — ABNORMAL HIGH (ref 11.5–15.5)
WBC: 6.2 10*3/uL (ref 4.0–10.5)
nRBC: 0 % (ref 0.0–0.2)

## 2018-11-24 LAB — I-STAT TROPONIN, ED: Troponin i, poc: 0.01 ng/mL (ref 0.00–0.08)

## 2018-11-24 LAB — D-DIMER, QUANTITATIVE: D-Dimer, Quant: 7.7 ug/mL-FEU — ABNORMAL HIGH (ref 0.00–0.50)

## 2018-11-24 LAB — I-STAT BETA HCG BLOOD, ED (MC, WL, AP ONLY): I-stat hCG, quantitative: <5 m[IU]/mL (ref <5)

## 2018-11-24 MED ORDER — IOHEXOL 350 MG/ML SOLN
100.0000 mL | Freq: Once | INTRAVENOUS | Status: AC | PRN
  Administered 2018-11-24: 100 mL via INTRAVENOUS

## 2018-11-24 MED ORDER — FENTANYL CITRATE (PF) 100 MCG/2ML IJ SOLN: 75.0000 ug | Freq: Once | INTRAMUSCULAR | Status: DC

## 2018-11-24 MED ORDER — TIZANIDINE HCL 4 MG PO TABS: 4.0000 mg | ORAL_TABLET | Freq: Three times a day (TID) | ORAL | 0 refills | Status: DC | PRN

## 2018-11-24 MED ORDER — IOHEXOL 300 MG/ML  SOLN: 100.0000 mL | Freq: Once | INTRAMUSCULAR | Status: DC

## 2018-11-24 MED ORDER — HYDROMORPHONE HCL 1 MG/ML IJ SOLN
1.0000 mg | Freq: Once | INTRAMUSCULAR | Status: AC
  Administered 2018-11-24: 1 mg via INTRAVENOUS
  Filled 2018-11-24: qty 1

## 2018-11-24 MED ORDER — LIDOCAINE 5 % EX PTCH: 1.0000 | MEDICATED_PATCH | CUTANEOUS | 0 refills | Status: AC

## 2018-11-24 MED ORDER — KETOROLAC TROMETHAMINE 15 MG/ML IJ SOLN
15.0000 mg | Freq: Once | INTRAMUSCULAR | Status: AC
  Administered 2018-11-24: 15 mg via INTRAVENOUS
  Filled 2018-11-24: qty 1

## 2018-11-24 MED ORDER — NAPROXEN 375 MG PO TABS: 375.0000 mg | ORAL_TABLET | Freq: Two times a day (BID) | ORAL | 0 refills | Status: AC

## 2018-11-24 MED ORDER — METHOCARBAMOL 1000 MG/10ML IJ SOLN
1000.0000 mg | Freq: Once | INTRAVENOUS | Status: AC
  Administered 2018-11-24: 1000 mg via INTRAVENOUS
  Filled 2018-11-24: qty 10

## 2018-11-24 MED ORDER — METHOCARBAMOL 1000 MG/10ML IJ SOLN: 1000.0000 mg | Freq: Once | INTRAMUSCULAR | Status: DC

## 2018-11-24 NOTE — ED Notes (Signed)
Patient transported to X-ray 

## 2018-11-24 NOTE — ED Notes (Signed)
EKG was done and given to [place], all computers are not exporting to MR.

## 2018-11-24 NOTE — ED Triage Notes (Signed)
Pt in with R anterior chest/shoulder area pain since last night. States she went to PT yesterday and had no problems, no injuries occurred. Pain is sharp, worse with inspiration or coughing.

## 2018-11-24 NOTE — ED Notes (Signed)
Patient transported to CT 

## 2018-11-24 NOTE — ED Provider Notes (Signed)
[facility] EMERGENCY DEPARTMENT Provider Note   CSN: [id number] Arrival date & time: 11/24/18  0607    History   Chief Complaint Chief Complaint  Patient presents with  . R shoulder pain    HPI Ashley [name] is a 43 y.o. female.     HPI   43 yo F here with R chest wall pain. Pt states that her pain began gradually yesterday. She had PT the day before, but denies specific injuries. She started noticing a progressively worsening and now severe sharp, R upper chest wall pain that shoots to the back. No nausea, vomiting. No hemoptysis. She has had a cough x several weeks but feels this is actually improving. Her pain is worse w/ palpation, movement, and deep inspiration. No alleviating factors. Of note, she did have a provoked DVT after surgery "years ago," is no longer on blood thinners. Denies OCP use.  Past Medical History:  Diagnosis Date  . Arthritis    knees, back  . 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 Complication of anesthesia   . Depression   . History of pneumonia   . Hypertension   . Lumbar spondylosis   . PCOS (polycystic ovarian syndrome)   . Pneumonia    2016  . PONV (postoperative nausea and vomiting)     Patient Active Problem List   Diagnosis Date Noted  . Lumbosacral radiculopathy at L4 06/09/2018  . Generalized anxiety disorder   . Pain   . Fall   . Labile blood pressure   . Neuropathic pain   . Therapeutic opioid induced constipation   . Urinary retention   . Lumbar radiculopathy 02/21/2018  . PCOS (polycystic ovarian syndrome)   . Morbid obesity ([place])   . Depression   . Generalized OA   . Postoperative pain   . Acute blood loss anemia   . Status post surgery 02/17/2018  . Lumbar radiculopathy, chronic 02/17/2018  . Primary localized osteoarthritis of right hip 04/21/2016  . Sinusitis 06/25/2015  . Cough 06/25/2015  . Essential hypertension 04/03/2015  . Dehydration 04/03/2015  . CAP (community acquired  pneumonia) 03/31/2015  . Right lower lobe pneumonia ([place]) 03/31/2015  . Fever 03/31/2015  . Pregnancy 04/29/2014  . Miscarriage   . Incomplete miscarriage 04/02/2011    Class: Acute  . Deep vein thrombosis (DVT) (HCC) 04/02/2011    Class: Chronic    Past Surgical History:  Procedure Laterality Date  . ABDOMINAL EXPOSURE N/A 02/17/2018   Procedure: ABDOMINAL EXPOSURE;  Surgeon: Rosetta Posner, MD;  Location: [facility];  Service: Vascular;  Laterality: N/A;  . ANKLE SURGERY     x 2  . ANTERIOR LAT LUMBAR FUSION N/A 02/17/2018   Procedure: Lumbar three-four Lumbar four-five  Anterolateral lumbar interbody fusion;  Surgeon: Kristeen Miss, MD;  Location: [facility];  Service: Neurosurgery;  Laterality: N/A;  . ANTERIOR LUMBAR FUSION N/A 02/17/2018   Procedure: Lumbar five-Sacral one Anterior lumbar interbody fusion;  Surgeon: Kristeen Miss, MD;  Location: [facility];  Service: Neurosurgery;  Laterality: N/A;  . APPLICATION OF ROBOTIC ASSISTANCE FOR SPINAL PROCEDURE N/A 02/17/2018   Procedure: APPLICATION OF ROBOTIC ASSISTANCE FOR SPINAL PROCEDURE;  Surgeon: Kristeen Miss, MD;  Location: [facility];  Service: Neurosurgery;  Laterality: N/A;  . APPLICATION OF ROBOTIC ASSISTANCE FOR SPINAL PROCEDURE N/A 06/09/2018   Procedure: APPLICATION OF ROBOTIC ASSISTANCE FOR SPINAL PROCEDURE;  Surgeon: Kristeen Miss, MD;  Location: [facility];  Service: Neurosurgery;  Laterality: N/A;  .  BACK SURGERY     2017  discectomy  . CARPAL TUNNEL RELEASE Left 07/04/2014   Procedure: LEFT CARPAL TUNNEL RELEASE;  Surgeon: Ninetta Lights, MD;  Location: [facility];  Service: Orthopedics;  Laterality: Left;  . CARPAL TUNNEL RELEASE Right 08/08/2014   Procedure: RIGHT CARPAL TUNNEL RELEASE;  Surgeon: Ninetta Lights, MD;  Location: [facility];  Service: Orthopedics;  Laterality: Right;  . DILATION AND CURETTAGE OF UTERUS    . DILATION AND EVACUATION  04/02/2011   Procedure: DILATATION AND EVACUATION (D&E);  Surgeon:  Luz Lex, MD;  Location: [facility];  Service: Gynecology;  Laterality: N/A;  dvt left mid thigh  . DORSAL COMPARTMENT RELEASE Left 07/04/2014   Procedure: LEFT DEQUERVAINS;  Surgeon: Ninetta Lights, MD;  Location: [facility];  Service: Orthopedics;  Laterality: Left;  . ENDOSCOPIC PLANTAR FASCIOTOMY Left 02/01/2002  . HARDWARE REMOVAL Right 06/09/2018   Procedure: Repositioning of Right Lumbar three Right lumbar  four pedicle screw with MAZOR;  Surgeon: Kristeen Miss, MD;  Location: [facility];  Service: Neurosurgery;  Laterality: Right;  . HYSTEROSCOPY W/D&C  07/17/2010   with exc. endometrial polyps  . KNEE ARTHROSCOPY Right 03/07/2003; 01/14/2005; 08/31/2006  . KNEE ARTHROSCOPY Left 03/21/2003; 11/26/2004  . LUMBAR PERCUTANEOUS PEDICLE SCREW 3 LEVEL N/A 02/17/2018   Procedure: LUMBAR PERCUTANEOUS PEDICLE SCREW PLACEMENT LUMBAR THREE-SACRAL ONE;  Surgeon: Kristeen Miss, MD;  Location: [facility];  Service: Neurosurgery;  Laterality: N/A;  . SHOULDER ARTHROSCOPY Right   . TOE SURGERY Left 01/10/2003   claw toe correction 2nd, 3rd, 4th toes  . TOTAL HIP ARTHROPLASTY Right 04/21/2016  . TOTAL HIP ARTHROPLASTY Right 04/21/2016   Procedure: TOTAL HIP ARTHROPLASTY ANTERIOR APPROACH;  Surgeon: Ninetta Lights, MD;  Location: [facility];  Service: Orthopedics;  Laterality: Right;  . TOTAL KNEE ARTHROPLASTY Left 01/21/2010  . TOTAL KNEE ARTHROPLASTY Right 08/26/2008     OB History    Gravida  4   Para  2   Term  1   Preterm  1   AB  2   Living  2     SAB  1   TAB  1   Ectopic      Multiple      Live Births  2            Home Medications    Prior to Admission medications   Medication Sig Start Date End Date Taking? Authorizing Provider  ALPRAZolam Duanne Moron) 1 MG tablet Take 1 tablet (1 mg total) by mouth daily as needed for anxiety. 03/07/18  Yes Angiulli, Lavon Paganini, PA-C  baclofen (LIORESAL) 10 MG tablet Take 3 tablets (30 mg total) by mouth 3 (three) times daily. 09/11/18  Yes  Jamse Arn, MD  buPROPion (WELLBUTRIN XL) 300 MG 24 hr tablet Take 1 tablet (300 mg total) by mouth daily. 03/07/18  Yes Angiulli, Lavon Paganini, PA-C  cefUROXime (CEFTIN) 500 MG tablet Take 500 mg by mouth 2 (two) times daily. for 10 days. Started on 11-15-18 11/15/18  Yes [provider]  cetirizine (ZYRTEC) 10 MG tablet Take 10 mg by mouth daily as needed for allergies.    Yes [provider]  diazepam (VALIUM) 5 MG tablet Take 1 tablet (5 mg total) by mouth every 6 (six) hours as needed for muscle spasms. 06/12/18  Yes Kristeen Miss, MD  EPINEPHrine 0.3 mg/0.3 mL IJ SOAJ injection Inject 3 mLs into the muscle once. 05/26/18  Yes  [provider]  losartan-hydrochlorothiazide (HYZAAR) 50-12.5 MG tablet Take 1 tablet by mouth daily. 03/07/18  Yes Angiulli, Lavon Paganini, PA-C  Multiple Vitamins-Minerals (MULTIVITAMIN WITH MINERALS) tablet Take 1 tablet daily by mouth.   Yes [provider]  oxyCODONE-acetaminophen (PERCOCET) 10-325 MG tablet Take 1 tablet by mouth every 4 (four) hours as needed. 06/12/18  Yes Kristeen Miss, MD  pregabalin (LYRICA) 75 MG capsule Take 1 capsule (75 mg total) by mouth 3 (three) times daily. 07/19/18  Yes Patel, Domenick Bookbinder, MD  QBREXZA 2.4 % PADS Apply 1 application topically at bedtime. Use as directed 09/21/18  Yes [provider]  sertraline (ZOLOFT) 100 MG tablet Take 1 tablet (100 mg total) by mouth daily. Patient taking differently: Take 200 mg by mouth daily.  03/08/18  Yes Angiulli, Lavon Paganini, PA-C  traZODone (DESYREL) 50 MG tablet Take 50 mg by mouth at bedtime as needed for sleep.  04/20/18  Yes [provider]  dexamethasone (DECADRON) 1 MG tablet 2 tablets twice daily for 2 days, one tablet twice daily for 2 days, one tablet daily for 2 days. Patient not taking: Reported on 11/24/2018 06/12/18   Kristeen Miss, MD  gentamicin cream (GARAMYCIN) 0.1 % Apply 1 application topically 2 (two) times daily. Patient not taking:  Reported on 11/24/2018 10/09/18   Edrick Kins, DPM  lidocaine (LIDODERM) 5 % Place 1 patch onto the skin daily for 7 days. Remove & Discard patch within 12 hours or as directed by MD 11/24/18 12/01/18  Duffy Bruce, MD  Lorcaserin HCl ER (BELVIQ XR) 20 MG TB24 Take 20 mg by mouth daily.    [provider]  naproxen (NAPROSYN) 375 MG tablet Take 1 tablet (375 mg total) by mouth 2 (two) times daily with a meal for 7 days. 11/24/18 12/01/18  Duffy Bruce, MD  terbinafine (LAMISIL) 250 MG tablet Take 1 tablet (250 mg total) by mouth daily. 10/09/18   Edrick Kins, DPM  tiZANidine (ZANAFLEX) 4 MG tablet Take 1 tablet (4 mg total) by mouth every 8 (eight) hours as needed for muscle spasms. 11/24/18   Duffy Bruce, MD    Family History Family History  Problem Relation Age of Onset  . Cancer Maternal Grandmother        UTERINE  . Cancer Paternal Grandmother        breast  . Breast cancer Paternal Grandmother   . Diabetes Father   . Cancer Father        COLON  . Heart disease Father   . Kidney disease Father        dialysis  . Diabetes Sister   . Breast cancer Cousin     Social History Social History   Tobacco Use  . Smoking status: Never Smoker  . Smokeless tobacco: Never Used  Substance Use Topics  . Alcohol use: No    Alcohol/week: 0.0 standard drinks  . Drug use: No     Allergies   Gadolinium derivatives; Codeine; and Cymbalta [duloxetine hcl]   Review of Systems Review of Systems  Constitutional: Positive for fatigue. Negative for chills and fever.  HENT: Negative for congestion and rhinorrhea.   Eyes: Negative for visual disturbance.  Respiratory: Positive for chest tightness. Negative for cough, shortness of breath and wheezing.   Cardiovascular: Positive for chest pain. Negative for leg swelling.  Gastrointestinal: Negative for abdominal pain, diarrhea, nausea and vomiting.  Genitourinary: Negative for dysuria and flank pain.  Musculoskeletal: Negative  for neck pain and  neck stiffness.  Skin: Negative for rash and wound.  Allergic/Immunologic: Negative for immunocompromised state.  Neurological: Negative for syncope, weakness and headaches.  All other systems reviewed and are negative.    Physical Exam Updated Vital Signs BP 98/86   Pulse 81   Resp (!) 25   Ht 6' (1.829 m)   Wt 131.5 kg   LMP 11/20/2018   SpO2 100%   BMI 39.33 kg/m   Physical Exam Vitals signs and nursing note reviewed.  Constitutional:      General: She is not in acute distress.    Appearance: She is well-developed.  HENT:     Head: Normocephalic and atraumatic.  Eyes:     Conjunctiva/sclera: Conjunctivae normal.  Neck:     Musculoskeletal: Neck supple.  Cardiovascular:     Rate and Rhythm: Normal rate and regular rhythm.     Heart sounds: Normal heart sounds. No murmur. No friction rub.  Pulmonary:     Effort: Pulmonary effort is normal. No respiratory distress.     Breath sounds: Normal breath sounds. No wheezing or rales.  Chest:       Comments: TTP right upper chest wall, worse w/ palpation, along 3-4th intercostal spaces. No overlying skin changes or skin lesions. Abdominal:     General: There is no distension.     Palpations: Abdomen is soft.     Tenderness: There is no abdominal tenderness.  Skin:    General: Skin is warm.     Capillary Refill: Capillary refill takes less than 2 seconds.  Neurological:     Mental Status: She is alert and oriented to person, place, and time.     Motor: No abnormal muscle tone.      ED Treatments / Results  Labs (all labs ordered are listed, but only abnormal results are displayed) Labs Reviewed  CBC WITH DIFFERENTIAL/PLATELET - Abnormal; Notable for the following components:      Result Value   Hemoglobin 10.2 (*)    HCT 34.4 (*)    MCV 75.9 (*)    MCH 22.5 (*)    MCHC 29.7 (*)    RDW 18.3 (*)    All other components within normal limits  COMPREHENSIVE METABOLIC PANEL - Abnormal; Notable  for the following components:   Sodium 134 (*)    Albumin 3.4 (*)    All other components within normal limits  D-DIMER, QUANTITATIVE (NOT AT [facility]) - Abnormal; Notable for the following components:   D-Dimer, Quant 7.70 (*)    All other components within normal limits  I-STAT TROPONIN, ED  I-STAT BETA HCG BLOOD, ED ([facility], [facility], [facility] ONLY)    EKG None  Radiology Dg Chest 2 View  Result Date: 11/24/2018 CLINICAL DATA:  Right upper chest wall pain EXAM: CHEST - 2 VIEW COMPARISON:  06/04/2015 FINDINGS: The heart size and mediastinal contours are within normal limits. Both lungs are clear. The visualized skeletal structures are unremarkable. IMPRESSION: No active cardiopulmonary disease. Electronically Signed   By: Monte Fantasia M.D.   On: 11/24/2018 07:46   Ct Angio Chest Pe W And/or Wo Contrast  Result Date: 11/24/2018 CLINICAL DATA:  Central chest pain radiating to the right chest and shoulder EXAM: CT ANGIOGRAPHY CHEST WITH CONTRAST TECHNIQUE: Multidetector CT imaging of the chest was performed using the standard protocol during bolus administration of intravenous contrast. Multiplanar CT image reconstructions and MIPs were obtained to evaluate the vascular anatomy. CONTRAST:  60 mL OMNIPAQUE IOHEXOL 350 MG/ML SOLN COMPARISON:  None. FINDINGS: Cardiovascular: No filling defects in the pulmonary arteries to suggest pulmonary emboli. Heart is normal size. Aorta is normal caliber. Mediastinum/Nodes: No mediastinal, hilar, or axillary adenopathy. Lungs/Pleura: Lungs are clear. No focal airspace opacities or suspicious nodules. No effusions. Upper Abdomen: Imaging into the upper abdomen shows no acute findings. Musculoskeletal: Chest wall soft tissues are unremarkable. No acute bony abnormality. Review of the MIP images confirms the above findings. IMPRESSION: No evidence of pulmonary embolus. No acute cardiopulmonary disease. Electronically Signed   By: Rolm Baptise M.D.   On: 11/24/2018 09:59     Procedures Procedures (including critical care time)  Medications Ordered in ED Medications  iohexol (OMNIPAQUE) 300 MG/ML solution 100 mL (has no administration in time range)  ketorolac (TORADOL) 15 MG/ML injection 15 mg (15 mg Intravenous Given 11/24/18 0815)  methocarbamol (ROBAXIN) 1,000 mg in dextrose 5 % 50 mL IVPB (0 mg Intravenous Stopped 11/24/18 0929)  HYDROmorphone (DILAUDID) injection 1 mg (1 mg Intravenous Given 11/24/18 1007)  iohexol (OMNIPAQUE) 350 MG/ML injection 100 mL (100 mLs Intravenous Contrast Given 11/24/18 0934)     Initial Impression / Assessment and Plan / ED Course  I have reviewed the triage vital signs and the nursing notes.  Pertinent labs & imaging results that were available during my care of the patient were reviewed by me and considered in my medical decision making (see chart for details).        43 yo F here with R upper chest wall pain. Suspect this is MSK, likely costochondritis/intercostal sprain, in setting of increased PT as well as lifting her 43 yo soon. CXR neg. Labs reassuring. EKG non-ischemic with trop neg x 1 despite constant unchanged sx for >48 hours, doubt ACS. D-Dimer positive but angio neg, no other abnormality on CT, doubt PE, AAS. No signs of pericarditis. Will treat, d/c with outpt follow-up.   Final Clinical Impressions(s) / ED Diagnoses   Final diagnoses:  Costochondritis  Chest wall pain    ED Discharge Orders         Ordered    naproxen (NAPROSYN) 375 MG tablet  2 times daily with meals     11/24/18 1120    tiZANidine (ZANAFLEX) 4 MG tablet  Every 8 hours PRN     11/24/18 1120    lidocaine (LIDODERM) 5 %  Every 24 hours     11/24/18 1120           Duffy Bruce, MD 11/24/18 1124

## 2018-11-28 ENCOUNTER — Other Ambulatory Visit: Payer: Self-pay | Admitting: Neurological Surgery

## 2018-11-28 DIAGNOSIS — R29898 Other symptoms and signs involving the musculoskeletal system: Secondary | ICD-10-CM

## 2018-11-29 ENCOUNTER — Ambulatory Visit
Admission: RE | Admit: 2018-11-29 | Discharge: 2018-11-29 | Disposition: A | Discharge status: Home / Self Care | Payer: Medicare Other | Source: Ambulatory Visit | Attending: Neurological Surgery | Admitting: Neurological Surgery

## 2018-11-29 DIAGNOSIS — R29898 Other symptoms and signs involving the musculoskeletal system: Secondary | ICD-10-CM

## 2018-11-30 DIAGNOSIS — M2041 Other hammer toe(s) (acquired), right foot: Secondary | ICD-10-CM | POA: Diagnosis not present

## 2018-11-30 DIAGNOSIS — M2042 Other hammer toe(s) (acquired), left foot: Secondary | ICD-10-CM | POA: Diagnosis not present

## 2018-12-04 ENCOUNTER — Encounter: Payer: Self-pay | Admitting: Podiatry

## 2018-12-04 ENCOUNTER — Telehealth: Payer: Self-pay

## 2018-12-04 NOTE — Telephone Encounter (Signed)
Called pt post-surgery; pt stated, "toes were throbbing, had to take pain medication; better now" pt asked if she could take sock off; informed pt all bandages should stay in tact until first post op visit.

## 2018-12-06 ENCOUNTER — Ambulatory Visit (INDEPENDENT_AMBULATORY_CARE_PROVIDER_SITE_OTHER): Payer: Medicare Other

## 2018-12-06 ENCOUNTER — Ambulatory Visit (INDEPENDENT_AMBULATORY_CARE_PROVIDER_SITE_OTHER): Payer: Self-pay | Admitting: Podiatry

## 2018-12-06 ENCOUNTER — Other Ambulatory Visit: Payer: Self-pay

## 2018-12-06 VITALS — Temp 98.1°F

## 2018-12-06 DIAGNOSIS — M2041 Other hammer toe(s) (acquired), right foot: Secondary | ICD-10-CM | POA: Diagnosis not present

## 2018-12-06 DIAGNOSIS — M2042 Other hammer toe(s) (acquired), left foot: Secondary | ICD-10-CM

## 2018-12-06 DIAGNOSIS — Z4889 Encounter for other specified surgical aftercare: Secondary | ICD-10-CM

## 2018-12-11 NOTE — Progress Notes (Signed)
   Subjective:  Patient presents today status post hammertoe repair of digits 3 and 4, right; 2nd digit left. DOS: 11/30/2018. She reports some intermittent throbbing pain of the feet. She denies any modifying factors and has been wearing [employer]/surgical shoes and gets reimbursed by her insurance company for them. Patient is here for further evaluation and treatment.    Past Medical History:  Diagnosis Date  . Arthritis    knees, back  . 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 Complication of anesthesia   . Depression   . History of pneumonia   . Hypertension   . Lumbar spondylosis   . PCOS (polycystic ovarian syndrome)   . Pneumonia    2016  . PONV (postoperative nausea and vomiting)       Objective/Physical Exam Neurovascular status intact.  Skin incisions appear to be well coapted with sutures and staples intact. No sign of infectious process noted. No dehiscence. No active bleeding noted. Moderate edema noted to the surgical extremity.  Radiographic Exam:  Orthopedic hardware and osteotomies sites appear to be stable with routine healing.  Assessment: 1. s/p hammertoe repair digits 3 and 4 right; 2nd digit left. DOS: 11/30/2018   Plan of Care:  1. Patient was evaluated. X-rays reviewed 2. Dressing changed.  3. Continue weightbearing in post op shoes.  4. Return to clinic in one week for suture removal.    Edrick Kins, DPM [facility]  Dr. Edrick Kins, [address]                                        [place], [place] [address]                Office [phone]  Fax [phone]

## 2018-12-18 ENCOUNTER — Other Ambulatory Visit: Payer: Self-pay

## 2018-12-18 ENCOUNTER — Encounter: Payer: Self-pay | Admitting: Podiatry

## 2018-12-18 ENCOUNTER — Ambulatory Visit (INDEPENDENT_AMBULATORY_CARE_PROVIDER_SITE_OTHER): Payer: Medicare Other | Admitting: Podiatry

## 2018-12-18 VITALS — BP 114/69 | HR 96 | Temp 99.6°F | Resp 16

## 2018-12-18 DIAGNOSIS — Z9889 Other specified postprocedural states: Secondary | ICD-10-CM

## 2018-12-18 DIAGNOSIS — M2041 Other hammer toe(s) (acquired), right foot: Secondary | ICD-10-CM

## 2018-12-18 NOTE — Progress Notes (Signed)
   Subjective:  Patient presents today status post hammertoe repair of digits 3 and 4, right; 2nd digit left. DOS: 11/30/2018.  Patient reports minimal pain.  She has been wearing her bilateral postoperative shoes.  No new complaints at this time.    Past Medical History:  Diagnosis Date  . Arthritis    knees, back  . 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 Complication of anesthesia   . Depression   . History of pneumonia   . Hypertension   . Lumbar spondylosis   . PCOS (polycystic ovarian syndrome)   . Pneumonia    2016  . PONV (postoperative nausea and vomiting)       Objective/Physical Exam Neurovascular status intact.  Skin incisions appear to be well coapted with sutures intact. No sign of infectious process noted. No dehiscence. No active bleeding noted. Moderate edema noted to the surgical toes  Assessment: 1. s/p hammertoe repair digits 3 and 4 right; 2nd digit left. DOS: 11/30/2018   Plan of Care:  1. Patient was evaluated.  2.  Sutures were removed today. 3.  Continue wearing postoperative shoes bilateral 4.  Return to clinic in 3 weeks for percutaneous fixation pin removal to the left foot and follow-up x-rays   Edrick Kins, DPM [facility]  Dr. Edrick Kins, [place]                                        [place], [place] [address]                Office [phone]  Fax [phone]

## 2019-01-10 ENCOUNTER — Ambulatory Visit (INDEPENDENT_AMBULATORY_CARE_PROVIDER_SITE_OTHER): Payer: Medicare Other | Admitting: Podiatry

## 2019-01-10 ENCOUNTER — Ambulatory Visit (INDEPENDENT_AMBULATORY_CARE_PROVIDER_SITE_OTHER): Payer: Medicare Other

## 2019-01-10 ENCOUNTER — Encounter: Payer: Self-pay | Admitting: Podiatry

## 2019-01-10 ENCOUNTER — Other Ambulatory Visit: Payer: Self-pay

## 2019-01-10 VITALS — Temp 97.7°F

## 2019-01-10 DIAGNOSIS — M79676 Pain in unspecified toe(s): Secondary | ICD-10-CM | POA: Diagnosis not present

## 2019-01-10 DIAGNOSIS — B351 Tinea unguium: Secondary | ICD-10-CM

## 2019-01-10 DIAGNOSIS — M2041 Other hammer toe(s) (acquired), right foot: Secondary | ICD-10-CM

## 2019-01-10 DIAGNOSIS — Z9889 Other specified postprocedural states: Secondary | ICD-10-CM

## 2019-01-10 DIAGNOSIS — M2042 Other hammer toe(s) (acquired), left foot: Secondary | ICD-10-CM

## 2019-01-10 NOTE — Progress Notes (Signed)
   Subjective:  Patient presents today status post hammertoe repair of digits 3 and 4, right; 2nd digit left. DOS: 11/30/2018.  Patient reports minimal pain.   Patient has been wearing certain shoes and open toed flip-flops.  She complains that the tips of her toes are little bit painful.  Patient also complains of elongated thickened painful toenails 1-5 bilateral.  She is unable to trim her own nails.  Past Medical History:  Diagnosis Date  . Arthritis    knees, back  . 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 Complication of anesthesia   . Depression   . History of pneumonia   . Hypertension   . Lumbar spondylosis   . PCOS (polycystic ovarian syndrome)   . Pneumonia    2016  . PONV (postoperative nausea and vomiting)       Objective/Physical Exam Neurovascular status intact.  Skin incisions appear to be well coapted. No sign of infectious process noted.  Minimal edema noted to the surgical toes.  Hyperkeratotic dystrophic nails noted 1-5 bilateral  Assessment: 1. s/p hammertoe repair digits 3 and 4 right; 2nd digit left. DOS: 11/30/2018 2.  Pain due to onychomycosis of toenails 1-5 bilateral   Plan of Care:  1. Patient was evaluated.  2.  Percutaneous fixation pin removed today from the left second toe 3.  Mechanical debridement of nails 1-5 bilateral was performed using a nail nipper without incident or bleeding 4.  Patient may resume good supportive tennis shoes.  May also resume full activity no restrictions 5.  Return to clinic as needed  Edrick Kins, DPM [facility]  Dr. Edrick Kins, [address]                                        [place], [place] [address]                Office [phone]  Fax [phone]

## 2019-01-18 DIAGNOSIS — B009 Herpesviral infection, unspecified: Secondary | ICD-10-CM | POA: Insufficient documentation

## 2019-01-26 ENCOUNTER — Other Ambulatory Visit: Payer: Self-pay

## 2019-01-26 ENCOUNTER — Encounter: Payer: Medicare Other | Attending: Physical Medicine & Rehabilitation | Admitting: Physical Medicine & Rehabilitation

## 2019-01-26 ENCOUNTER — Encounter: Payer: Self-pay | Admitting: Physical Medicine & Rehabilitation

## 2019-01-26 VITALS — Ht 71.5 in | Wt 290.0 lb

## 2019-01-26 DIAGNOSIS — F411 Generalized anxiety disorder: Secondary | ICD-10-CM

## 2019-01-26 DIAGNOSIS — G8918 Other acute postprocedural pain: Secondary | ICD-10-CM

## 2019-01-26 DIAGNOSIS — R269 Unspecified abnormalities of gait and mobility: Secondary | ICD-10-CM

## 2019-01-26 DIAGNOSIS — M5416 Radiculopathy, lumbar region: Secondary | ICD-10-CM | POA: Diagnosis not present

## 2019-01-26 DIAGNOSIS — M792 Neuralgia and neuritis, unspecified: Secondary | ICD-10-CM

## 2019-01-26 MED ORDER — BACLOFEN 10 MG PO TABS: 30.0000 mg | ORAL_TABLET | Freq: Three times a day (TID) | ORAL | 1 refills | Status: DC

## 2019-01-26 MED ORDER — PREGABALIN 75 MG PO CAPS: 75.0000 mg | ORAL_CAPSULE | Freq: Three times a day (TID) | ORAL | 1 refills | Status: DC

## 2019-01-26 NOTE — Progress Notes (Addendum)
Subjective:    Patient ID: Ashley [name], female    DOB: 11-15-1975, 43 y.o.   MRN: [id number]  TELEHEALTH NOTE  Due to national recommendations of social distancing due to COVID 19, an audio/video telehealth visit is felt to be most appropriate for this patient at this time.  See Chart message from today for the patient's consent to telehealth from [place].     I verified that I am speaking with the correct person using two identifiers.  Location of patient: Home Location of provider: Office Method of communication: Telephone Names of participants : Zorita Pang scheduling, Marland Mcalpine obtaining consent and vitals if available Established patient Time spent on call: 20 minutes.  HPI Right-handed female with history of hypertension, morbid obesity, bilateral TKA and chronic radicular back pain presents for follow up for  lumbar spondylosis with radiculopathy s/pdecompression L3-4 and L4-5 with anterior exposure of L5-S1 as well as percutaneous fixation L3 to the sacrum 02/17/2018 and resulting right L3 radiculopathy s/p hardware replacement and revision.  Last clinic visit  10/04/2018.  Since that time, patient went to the ED for right shoulder pain, work-up unremarkable.  Since that time, patient states right shoulder pain is improving.  She had hammer toe surgery on b/l feet. She is recovering. Therapies were put on hold due to surgery, awaiting resolution of Covid. She saw Neurosurg via video call.  She is having pain in b/l buttock after sitting on for some time.  She has gained weight. She continues to take Lyrica.  She was off Baclofen, but started taking it again. She also started having pain in her right calf, intermittent, lasting for a few minutes, muscle cramping. Lidoderm patch with good benefit. She has not been using TENS IT. She is using heating pad.  Sleep is poor.  Denies  Pain Inventory Average Pain 6 Pain Right Now 4 My pain  is constant, sharp, tingling and aching  In the last 24 hours, has pain interfered with the following? General activity 5 Relation with others 8 Enjoyment of life 8 What TIME of day is your pain at its worst? all Sleep (in general) Fair  Pain is worse with: bending, inactivity and some activites Pain improves with: heat/ice, medication and TENS Relief from Meds: 6  Mobility walk without assistance ability to climb steps?  yes do you drive?  yes  Function disabled: date disabled .  Neuro/Psych weakness numbness tingling spasms  Prior Studies Any changes since last visit?  no  Physicians involved in your care Any changes since last visit?  no   Family History  Problem Relation Age of Onset  . Cancer Maternal Grandmother        UTERINE  . Cancer Paternal Grandmother        breast  . Breast cancer Paternal Grandmother   . Diabetes Father   . Cancer Father        COLON  . Heart disease Father   . Kidney disease Father        dialysis  . Diabetes Sister   . Breast cancer Cousin    Social History   Socioeconomic History  . Marital status: Legally Separated    Spouse name: Not on file  . Number of children: Not on file  . Years of education: Not on file  . Highest education level: Not on file  Occupational History  . Not on file  Social Needs  . Financial resource strain: Not on  file  . Food insecurity:    Worry: Not on file    Inability: Not on file  . Transportation needs:    Medical: Not on file    Non-medical: Not on file  Tobacco Use  . Smoking status: Never Smoker  . Smokeless tobacco: Never Used  Substance and Sexual Activity  . Alcohol use: No    Alcohol/week: 0.0 standard drinks  . Drug use: No  . Sexual activity: Not on file  Lifestyle  . Physical activity:    Days per week: Not on file    Minutes per session: Not on file  . Stress: Not on file  Relationships  . Social connections:    Talks on phone: Not on file    Gets together:  Not on file    Attends religious service: Not on file    Active member of club or organization: Not on file    Attends meetings of clubs or organizations: Not on file    Relationship status: Not on file  Other Topics Concern  . Not on file  Social History Narrative  . Not on file   Past Surgical History:  Procedure Laterality Date  . ABDOMINAL EXPOSURE N/A 02/17/2018   Procedure: ABDOMINAL EXPOSURE;  Surgeon: Rosetta Posner, MD;  Location[facility] OR;  Service: Vascular;  Laterality: N/A;  . ANKLE SURGERY     x 2  . ANTERIOR LAT LUMBAR FUSION N/A 02/17/2018   Procedure: Lumbar three-four Lumbar four-five  Anterolateral lumbar interbody fusion;  Surgeon: Kristeen Miss, MD;  Location: [place];  Service: Neurosurgery;  Laterality: N/A;  . ANTERIOR LUMBAR FUSION N/A 02/17/2018   Procedure: Lumbar five-Sacral one Anterior lumbar interbody fusion;  Surgeon: Kristeen Miss, MD;  Location: [place];  Service: Neurosurgery;  Laterality: N/A;  . APPLICATION OF ROBOTIC ASSISTANCE FOR SPINAL PROCEDURE N/A 02/17/2018   Procedure: APPLICATION OF ROBOTIC ASSISTANCE FOR SPINAL PROCEDURE;  Surgeon: Kristeen Miss, MD;  Location: [place];  Service: Neurosurgery;  Laterality: N/A;  . APPLICATION OF ROBOTIC ASSISTANCE FOR SPINAL PROCEDURE N/A 06/09/2018   Procedure: APPLICATION OF ROBOTIC ASSISTANCE FOR SPINAL PROCEDURE;  Surgeon: Kristeen Miss, MD;  Location: [place];  Service: Neurosurgery;  Laterality: N/A;  . BACK SURGERY     2017  discectomy  . CARPAL TUNNEL RELEASE Left 07/04/2014   Procedure: LEFT CARPAL TUNNEL RELEASE;  Surgeon: Ninetta Lights, MD;  Location: [place];  Service: Orthopedics;  Laterality: Left;  . CARPAL TUNNEL RELEASE Right 08/08/2014   Procedure: RIGHT CARPAL TUNNEL RELEASE;  Surgeon: Ninetta Lights, MD;  Location: [place];  Service: Orthopedics;  Laterality: Right;  . DILATION AND CURETTAGE OF UTERUS    . DILATION AND EVACUATION  04/02/2011   Procedure: DILATATION  AND EVACUATION (D&E);  Surgeon: [name] Lex, MD;  Location: [facility];  Service: Gynecology;  Laterality: N/A;  dvt left mid thigh  . DORSAL COMPARTMENT RELEASE Left 07/04/2014   Procedure: LEFT DEQUERVAINS;  Surgeon: Ninetta Lights, MD;  Location: [place];  Service: Orthopedics;  Laterality: Left;  . ENDOSCOPIC PLANTAR FASCIOTOMY Left 02/01/2002  . HARDWARE REMOVAL Right 06/09/2018   Procedure: Repositioning of Right Lumbar three Right lumbar  four pedicle screw with MAZOR;  Surgeon: Kristeen Miss, MD;  Location: [place];  Service: Neurosurgery;  Laterality: Right;  . HYSTEROSCOPY W/D&C  07/17/2010   with exc. endometrial polyps  . KNEE ARTHROSCOPY Right 03/07/2003; 01/14/2005; 08/31/2006  . KNEE ARTHROSCOPY Left 03/21/2003; 11/26/2004  .  LUMBAR PERCUTANEOUS PEDICLE SCREW 3 LEVEL N/A 02/17/2018   Procedure: LUMBAR PERCUTANEOUS PEDICLE SCREW PLACEMENT LUMBAR THREE-SACRAL ONE;  Surgeon: Kristeen Miss, MD;  Location: [place];  Service: Neurosurgery;  Laterality: N/A;  . SHOULDER ARTHROSCOPY Right   . TOE SURGERY Left 01/10/2003   claw toe correction 2nd, 3rd, 4th toes  . TOTAL HIP ARTHROPLASTY Right 04/21/2016  . TOTAL HIP ARTHROPLASTY Right 04/21/2016   Procedure: TOTAL HIP ARTHROPLASTY ANTERIOR APPROACH;  Surgeon: Ninetta Lights, MD;  Location: [place];  Service: Orthopedics;  Laterality: Right;  . TOTAL KNEE ARTHROPLASTY Left 01/21/2010  . TOTAL KNEE ARTHROPLASTY Right 08/26/2008   Past Medical History:  Diagnosis Date  . Arthritis    knees, back  . 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 Complication of anesthesia   . Depression   . History of pneumonia   . Hypertension   . Lumbar spondylosis   . PCOS (polycystic ovarian syndrome)   . Pneumonia    2016  . PONV (postoperative nausea and vomiting)    Ht 5' 11.5" (1.816 m)   Wt 290 lb (131.5 kg)   LMP 01/15/2019   BMI 39.88 kg/m   Opioid Risk Score:   Fall Risk Score:  `1  Depression screen PHQ 2/9  Depression  screen [facility] 2/9 07/19/2018 07/13/2016 11/30/2015 08/17/2015 06/25/2015 06/01/2015 04/10/2015  Decreased Interest 1 2 0 0 0 0 0  Down, Depressed, Hopeless 1 3 0 0 0 0 0  PHQ - 2 Score 2 5 0 0 0 0 0  Some recent data might be hidden    Review of Systems  Constitutional: Negative.   HENT: Negative.   Eyes: Negative.   Respiratory: Negative.   Cardiovascular: Negative.   Gastrointestinal: Negative.   Endocrine: Negative.   Genitourinary: Negative.   Musculoskeletal: Positive for arthralgias and back pain.       Spasms   Skin: Negative.   Allergic/Immunologic: Negative.   Neurological: Positive for weakness and numbness.       Tingling   Hematological: Negative.   Psychiatric/Behavioral: Negative.   All other systems reviewed and are negative.     Objective:   Physical Exam Gen: NAD. Pulm: Effort normal Neuro: Alert and oriented    Assessment & Plan:  Right-handed female with history of hypertension, morbid obesity, bilateral TKA and chronic radicular back pain presents for follow up for  lumbar spondylosis with radiculopathy s/p decompression L3-4 and L4-5 with anterior exposure of L5-S1 as well as percutaneous fixation L3 to the sacrum 02/17/2018 and resulting right L3 radiculopathy s/p hardware replacement and revision.     1. Decreased functional mobility secondary to lumbar spondylosis with radiculopathy.S/P decompression L3-4 and L4-5 with anterior exposure of L5-S1 as well as percutaneous fixation L3 to the sacrum 02/17/2018 s/p revision and hardware replacement              Cont therapies once CoVid situation improved             Cont follow up with Neurosurg  Encourage follow-up with neurosurgery if buttock pain continues to progress despite conservative measures and/or radiates to lower extremities   2. Pain Management:              Unable to tolerate Gabapentin at high doses             Cont Lyrica 75 TID.             Cont Baclofen 30 mg TID, schedule for time being  due  to calf cramps             Cont Lidoderm patch             Cont TENS IT, reminded             Cont heating pad for limited time (only using occasionally)             Cont follow up [facility]  Taking Percocet 2-3/day per pain management - [facility]   3. Mood             Cont follow up with Psychiatry   4. Morbid obesity.              States she knows what she needs to do and has lost weight in the past             Follow up with PCP  Encourage excercise   5. Gait abnormality             No assistive device at present             Cont therapies once able

## 2019-02-02 ENCOUNTER — Ambulatory Visit: Payer: Medicare Other | Admitting: Physical Medicine & Rehabilitation

## 2019-02-22 ENCOUNTER — Encounter: Payer: Medicare Other | Attending: Physical Medicine & Rehabilitation | Admitting: Physical Medicine & Rehabilitation

## 2019-02-22 ENCOUNTER — Other Ambulatory Visit: Payer: Self-pay

## 2019-02-22 ENCOUNTER — Encounter: Payer: Self-pay | Admitting: Physical Medicine & Rehabilitation

## 2019-02-22 VITALS — BP 132/87 | HR 79 | Temp 98.9°F | Ht 71.0 in | Wt 293.0 lb

## 2019-02-22 DIAGNOSIS — M2142 Flat foot [pes planus] (acquired), left foot: Secondary | ICD-10-CM | POA: Diagnosis present

## 2019-02-22 DIAGNOSIS — R269 Unspecified abnormalities of gait and mobility: Secondary | ICD-10-CM | POA: Insufficient documentation

## 2019-02-22 DIAGNOSIS — M5416 Radiculopathy, lumbar region: Secondary | ICD-10-CM | POA: Diagnosis not present

## 2019-02-22 DIAGNOSIS — F411 Generalized anxiety disorder: Secondary | ICD-10-CM

## 2019-02-22 DIAGNOSIS — M2141 Flat foot [pes planus] (acquired), right foot: Secondary | ICD-10-CM | POA: Diagnosis not present

## 2019-02-22 DIAGNOSIS — M792 Neuralgia and neuritis, unspecified: Secondary | ICD-10-CM | POA: Diagnosis not present

## 2019-02-22 DIAGNOSIS — G8918 Other acute postprocedural pain: Secondary | ICD-10-CM

## 2019-02-22 NOTE — Progress Notes (Signed)
Subjective:    Patient ID: Ashley [name], female    DOB: 1976/08/10, 43 y.o.   MRN: [id number]  HPI Right-handed female with history of hypertension, morbid obesity, bilateral TKA and chronic radicular back pain presents for follow up for  lumbar spondylosis with radiculopathy s/pdecompression L3-4 and L4-5 with anterior exposure of L5-S1 as well as percutaneous fixation L3 to the sacrum 02/17/2018 and resulting right L3 radiculopathy s/p hardware replacement and revision.  Last clinic visit  01/26/2019.  Since that time, she continues to await beginning of therapies. She saw Neurosurg and had a scan, and states she was told everything was okay. She is taking Baclofen BID now. She is using TENS now. Denies falls. She is trying to ambulate.     Pain Inventory Average Pain 6 Pain Right Now 2 My pain is constant, sharp, tingling and aching  In the last 24 hours, has pain interfered with the following? General activity 5 Relation with others 7 Enjoyment of life 8 What TIME of day is your pain at its worst? all Sleep (in general) Fair  Pain is worse with: bending, inactivity and some activites Pain improves with: heat/ice, medication and TENS Relief from Meds: 6  Mobility walk without assistance ability to climb steps?  yes do you drive?  yes  Function disabled: date disabled .  Neuro/Psych weakness numbness tingling spasms  Prior Studies Any changes since last visit?  no  Physicians involved in your care Any changes since last visit?  no   Family History  Problem Relation Age of Onset  . Cancer Maternal Grandmother        UTERINE  . Cancer Paternal Grandmother        breast  . Breast cancer Paternal Grandmother   . Diabetes Father   . Cancer Father        COLON  . Heart disease Father   . Kidney disease Father        dialysis  . Diabetes Sister   . Breast cancer Cousin    Social History   Socioeconomic History  . Marital status: Legally Separated   Spouse name: Not on file  . Number of children: Not on file  . Years of education: Not on file  . Highest education level: Not on file  Occupational History  . Not on file  Social Needs  . Financial resource strain: Not on file  . Food insecurity:    Worry: Not on file    Inability: Not on file  . Transportation needs:    Medical: Not on file    Non-medical: Not on file  Tobacco Use  . Smoking status: Never Smoker  . Smokeless tobacco: Never Used  Substance and Sexual Activity  . Alcohol use: No    Alcohol/week: 0.0 standard drinks  . Drug use: No  . Sexual activity: Not on file  Lifestyle  . Physical activity:    Days per week: Not on file    Minutes per session: Not on file  . Stress: Not on file  Relationships  . Social connections:    Talks on phone: Not on file    Gets together: Not on file    Attends religious service: Not on file    Active member of club or organization: Not on file    Attends meetings of clubs or organizations: Not on file    Relationship status: Not on file  Other Topics Concern  . Not on file  Social History Narrative  .  Not on file   Past Surgical History:  Procedure Laterality Date  . ABDOMINAL EXPOSURE N/A 02/17/2018   Procedure: ABDOMINAL EXPOSURE;  Surgeon: Rosetta Posner, MD;  Location: [facility] OR;  Service: Vascular;  Laterality: N/A;  . ANKLE SURGERY     x 2  . ANTERIOR LAT LUMBAR FUSION N/A 02/17/2018   Procedure: Lumbar three-four Lumbar four-five  Anterolateral lumbar interbody fusion;  Surgeon: Kristeen Miss, MD;  Location: [place];  Service: Neurosurgery;  Laterality: N/A;  . ANTERIOR LUMBAR FUSION N/A 02/17/2018   Procedure: Lumbar five-Sacral one Anterior lumbar interbody fusion;  Surgeon: Kristeen Miss, MD;  Location: [place];  Service: Neurosurgery;  Laterality: N/A;  . APPLICATION OF ROBOTIC ASSISTANCE FOR SPINAL PROCEDURE N/A 02/17/2018   Procedure: APPLICATION OF ROBOTIC ASSISTANCE FOR SPINAL PROCEDURE;  Surgeon: Kristeen Miss, MD;   Location: [place];  Service: Neurosurgery;  Laterality: N/A;  . APPLICATION OF ROBOTIC ASSISTANCE FOR SPINAL PROCEDURE N/A 06/09/2018   Procedure: APPLICATION OF ROBOTIC ASSISTANCE FOR SPINAL PROCEDURE;  Surgeon: Kristeen Miss, MD;  Location: [place];  Service: Neurosurgery;  Laterality: N/A;  . BACK SURGERY     2017  discectomy  . CARPAL TUNNEL RELEASE Left 07/04/2014   Procedure: LEFT CARPAL TUNNEL RELEASE;  Surgeon: Ninetta Lights, MD;  Location: [place];  Service: Orthopedics;  Laterality: Left;  . CARPAL TUNNEL RELEASE Right 08/08/2014   Procedure: RIGHT CARPAL TUNNEL RELEASE;  Surgeon: Ninetta Lights, MD;  Location: [place];  Service: Orthopedics;  Laterality: Right;  . DILATION AND CURETTAGE OF UTERUS    . DILATION AND EVACUATION  04/02/2011   Procedure: DILATATION AND EVACUATION (D&E);  Surgeon: [name] Lex, MD;  Location: [place] [facility];  Service: Gynecology;  Laterality: N/A;  dvt left mid thigh  . DORSAL COMPARTMENT RELEASE Left 07/04/2014   Procedure: LEFT DEQUERVAINS;  Surgeon: Ninetta Lights, MD;  Location: [place];  Service: Orthopedics;  Laterality: Left;  . ENDOSCOPIC PLANTAR FASCIOTOMY Left 02/01/2002  . HARDWARE REMOVAL Right 06/09/2018   Procedure: Repositioning of Right Lumbar three Right lumbar  four pedicle screw with MAZOR;  Surgeon: Kristeen Miss, MD;  Location: [place];  Service: Neurosurgery;  Laterality: Right;  . HYSTEROSCOPY W/D&C  07/17/2010   with exc. endometrial polyps  . KNEE ARTHROSCOPY Right 03/07/2003; 01/14/2005; 08/31/2006  . KNEE ARTHROSCOPY Left 03/21/2003; 11/26/2004  . LUMBAR PERCUTANEOUS PEDICLE SCREW 3 LEVEL N/A 02/17/2018   Procedure: LUMBAR PERCUTANEOUS PEDICLE SCREW PLACEMENT LUMBAR THREE-SACRAL ONE;  Surgeon: Kristeen Miss, MD;  Location: [place];  Service: Neurosurgery;  Laterality: N/A;  . SHOULDER ARTHROSCOPY Right   . TOE SURGERY Left 01/10/2003   claw toe correction 2nd, 3rd, 4th toes  . TOTAL HIP  ARTHROPLASTY Right 04/21/2016  . TOTAL HIP ARTHROPLASTY Right 04/21/2016   Procedure: TOTAL HIP ARTHROPLASTY ANTERIOR APPROACH;  Surgeon: Ninetta Lights, MD;  Location: [place];  Service: Orthopedics;  Laterality: Right;  . TOTAL KNEE ARTHROPLASTY Left 01/21/2010  . TOTAL KNEE ARTHROPLASTY Right 08/26/2008   Past Medical History:  Diagnosis Date  . Arthritis    knees, back  . 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 Complication of anesthesia   . Depression   . History of pneumonia   . Hypertension   . Lumbar spondylosis   . PCOS (polycystic ovarian syndrome)   . Pneumonia    2016  . PONV (postoperative nausea and vomiting)    BP 132/87   Pulse 79  Temp 98.9 F (37.2 C)   Ht 5\' 11"  (1.803 m)   Wt 293 lb (132.9 kg)   SpO2 97%   BMI 40.87 kg/m   Opioid Risk Score:   Fall Risk Score:  `1  Depression screen PHQ 2/9  Depression screen [facility] 2/9 07/19/2018 07/13/2016 11/30/2015 08/17/2015 06/25/2015 06/01/2015 04/10/2015  Decreased Interest 1 2 0 0 0 0 0  Down, Depressed, Hopeless 1 3 0 0 0 0 0  PHQ - 2 Score 2 5 0 0 0 0 0  Some recent data might be hidden    Review of Systems  Constitutional: Negative.   HENT: Negative.   Eyes: Negative.   Respiratory: Negative.   Cardiovascular: Negative.   Gastrointestinal: Negative.   Endocrine: Negative.   Genitourinary: Negative.   Musculoskeletal: Positive for arthralgias and back pain.       Spasms   Skin: Negative.   Allergic/Immunologic: Negative.   Neurological: Positive for weakness and numbness.       Tingling   Hematological: Negative.   Psychiatric/Behavioral: Negative.   All other systems reviewed and are negative.     Objective:   Physical Exam Constitutional: No distress . Obese HENT: Normocephalic, atraumatic Eyes: EOMI, No discharge Cardiovascular: No JVD. Respiratory: Normal effort  GI: Non-distended Musc: no edema, no tenderness Pes planus b/l Gait: Right limp Neurological: She isalert.   LLE: 5/5 proximal to distal RLE: HF 4-/5, KE 4-/5, ADF 4+/5  Sensation diminished to light touch rightlateralthigh.  Skin:warm and dry. Psychiatric:Flat    Assessment & Plan:  Right-handed female with history of hypertension, morbid obesity, bilateral TKA and chronic radicular back pain presents for follow up for lumbar spondylosis with radiculopathy s/p decompression L3-4 and L4-5 with anterior exposure of L5-S1 as well as percutaneous fixation L3 to the sacrum 02/17/2018 and resulting right L3 radiculopathy s/p hardware replacement and revision.     1. Decreased functional mobility secondary to lumbar spondylosis with radiculopathy.S/P decompression L3-4 and L4-5 with anterior exposure of L5-S1 as well as percutaneous fixation L3 to the sacrum 02/17/2018 s/p revision and hardware replacement              Cont therapies once CoVid situation improved, continues to await             Cont follow up with Neurosurg   2. Pain Management:              Unable to tolerate Gabapentin at high doses             Cont Lyrica 75 TID.             Cont Baclofen 30 mg BID             Cont Lidoderm patch             Cont TENS IT             Cont heating pad for limited time (only using occasionally)             Cont follow up [facility]  Taking Percocet 2-3/day per pain management - [facility]   3. Mood             Cont follow up with Psychiatry   4. Morbid obesity.              States she knows what she needs to do and has lost weight in the past, reminded again  Follow up with PCP  Encourage excercise   5. Gait abnormality             No assistive device at present             Cont therapies once able  6. Pes Planus b/l  Orthotics consult

## 2019-02-26 ENCOUNTER — Ambulatory Visit: Payer: Medicare Other | Admitting: Physical Medicine & Rehabilitation

## 2019-04-23 ENCOUNTER — Ambulatory Visit: Payer: Medicare Other | Admitting: Physical Therapy

## 2019-04-26 ENCOUNTER — Encounter: Payer: Medicare Other | Admitting: Physical Medicine & Rehabilitation

## 2019-04-30 ENCOUNTER — Other Ambulatory Visit: Payer: Self-pay

## 2019-04-30 ENCOUNTER — Ambulatory Visit: Payer: Medicare Other | Attending: Neurological Surgery | Admitting: Physical Therapy

## 2019-04-30 DIAGNOSIS — M6281 Muscle weakness (generalized): Secondary | ICD-10-CM | POA: Diagnosis not present

## 2019-04-30 DIAGNOSIS — R52 Pain, unspecified: Secondary | ICD-10-CM | POA: Insufficient documentation

## 2019-04-30 DIAGNOSIS — G8929 Other chronic pain: Secondary | ICD-10-CM | POA: Insufficient documentation

## 2019-04-30 DIAGNOSIS — M545 Low back pain: Secondary | ICD-10-CM | POA: Diagnosis present

## 2019-04-30 DIAGNOSIS — R2681 Unsteadiness on feet: Secondary | ICD-10-CM | POA: Diagnosis present

## 2019-05-01 NOTE — Therapy (Signed)
[place] [address] [place], [place], [address] Phone: [phone]   Fax:  [phone]  Physical Therapy Re-Evaluation and Treatment  Patient Details  Name: Ashley [name] MRN: [id number] Date of Birth: 1975-10-31 Referring Provider (PT): Dr. Kristeen Miss    Encounter Date: 04/30/2019  PT End of Session - 05/01/19 0812    Visit Number  24    Number of Visits  12   RE updated POC   Date for PT Re-Evaluation  06/11/19  per updated POC   Authorization Type  [place] 10th visit progress note    PT Start Time  0930    PT Stop Time  1015    PT Time Calculation (min)  45 min    Activity Tolerance  Patient tolerated treatment well    Behavior During Therapy  [facility] for tasks assessed/performed       Past Medical History:  Diagnosis Date  . Arthritis    knees, back  . 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 Complication of anesthesia   . Depression   . History of pneumonia   . Hypertension   . Lumbar spondylosis   . PCOS (polycystic ovarian syndrome)   . Pneumonia    2016  . PONV (postoperative nausea and vomiting)     Past Surgical History:  Procedure Laterality Date  . ABDOMINAL EXPOSURE N/A 02/17/2018   Procedure: ABDOMINAL EXPOSURE;  Surgeon: Rosetta Posner, MD;  Location: [facility] OR;  Service: Vascular;  Laterality: N/A;  . ANKLE SURGERY     x 2  . ANTERIOR LAT LUMBAR FUSION N/A 02/17/2018   Procedure: Lumbar three-four Lumbar four-five  Anterolateral lumbar interbody fusion;  Surgeon: Kristeen Miss, MD;  Location: [place];  Service: Neurosurgery;  Laterality: N/A;  . ANTERIOR LUMBAR FUSION N/A 02/17/2018   Procedure: Lumbar five-Sacral one Anterior lumbar interbody fusion;  Surgeon: Kristeen Miss, MD;  Location: [place];  Service: Neurosurgery;  Laterality: N/A;  . APPLICATION OF ROBOTIC ASSISTANCE FOR SPINAL PROCEDURE N/A 02/17/2018   Procedure: APPLICATION OF ROBOTIC ASSISTANCE FOR SPINAL  PROCEDURE;  Surgeon: Kristeen Miss, MD;  Location: [facility];  Service: Neurosurgery;  Laterality: N/A;  . APPLICATION OF ROBOTIC ASSISTANCE FOR SPINAL PROCEDURE N/A 06/09/2018   Procedure: APPLICATION OF ROBOTIC ASSISTANCE FOR SPINAL PROCEDURE;  Surgeon: Kristeen Miss, MD;  Location: [place];  Service: Neurosurgery;  Laterality: N/A;  . BACK SURGERY     2017  discectomy  . CARPAL TUNNEL RELEASE Left 07/04/2014   Procedure: LEFT CARPAL TUNNEL RELEASE;  Surgeon: Ninetta Lights, MD;  Location: [place];  Service: Orthopedics;  Laterality: Left;  . CARPAL TUNNEL RELEASE Right 08/08/2014   Procedure: RIGHT CARPAL TUNNEL RELEASE;  Surgeon: Ninetta Lights, MD;  Location: [facility];  Service: Orthopedics;  Laterality: Right;  . DILATION AND CURETTAGE OF UTERUS    . DILATION AND EVACUATION  04/02/2011   Procedure: DILATATION AND EVACUATION (D&E);  Surgeon: Luz Lex, MD;  Location: [facility];  Service: Gynecology;  Laterality: N/A;  dvt left mid thigh  . DORSAL COMPARTMENT RELEASE Left 07/04/2014   Procedure: LEFT DEQUERVAINS;  Surgeon: Ninetta Lights, MD;  Location: [facility];  Service: Orthopedics;  Laterality: Left;  . ENDOSCOPIC PLANTAR FASCIOTOMY Left 02/01/2002  . HARDWARE REMOVAL Right 06/09/2018   Procedure: Repositioning of Right Lumbar three Right lumbar  four pedicle screw with MAZOR;  Surgeon: Kristeen Miss, MD;  Location: [facility]  OR;  Service: Neurosurgery;  Laterality: Right;  . HYSTEROSCOPY W/D&C  07/17/2010   with exc. endometrial polyps  . KNEE ARTHROSCOPY Right 03/07/2003; 01/14/2005; 08/31/2006  . KNEE ARTHROSCOPY Left 03/21/2003; 11/26/2004  . LUMBAR PERCUTANEOUS PEDICLE SCREW 3 LEVEL N/A 02/17/2018   Procedure: LUMBAR PERCUTANEOUS PEDICLE SCREW PLACEMENT LUMBAR THREE-SACRAL ONE;  Surgeon: Kristeen Miss, MD;  Location: [facility];  Service: Neurosurgery;  Laterality: N/A;  . SHOULDER ARTHROSCOPY Right   . TOE SURGERY Left 01/10/2003   claw toe correction  2nd, 3rd, 4th toes  . TOTAL HIP ARTHROPLASTY Right 04/21/2016  . TOTAL HIP ARTHROPLASTY Right 04/21/2016   Procedure: TOTAL HIP ARTHROPLASTY ANTERIOR APPROACH;  Surgeon: Ninetta Lights, MD;  Location: [facility];  Service: Orthopedics;  Laterality: Right;  . TOTAL KNEE ARTHROPLASTY Left 01/21/2010  . TOTAL KNEE ARTHROPLASTY Right 08/26/2008    There were no vitals filed for this visit.  Subjective Assessment - 04/30/19 1011    Subjective  Re-evaluaiton as she had to stop PT in march due to Covid 19, she feels she has regressed a lot and is having more Rt hip weakness and tighntess along with lumbar tightness making it difficulty for her to reach her feet, cross her legs, pick up her Right leg for funcitonal mobiliy and gait, and difficulty with laundry or going to grocery store.    Pertinent History  Past Surgical Hx: B TKA (2009, 2011) and R THA 2017. PMH includes; OA, Morbid Obesity, Depression, HTN, Lumbar Spondylosis, Polycystic Ovarian Syndrome, Pneumonia, and PONV    Limitations  Standing;Walking;Lifting;Sitting    How long can you sit comfortably?  Sitting is uncomfortable. No amount of time specified.     How long can you stand comfortably?  about 5-10 mins    How long can you walk comfortably?  Short period of time with no time period specified    Patient Stated Goals  Reports goal of being more mobile with increased flexibility. Wants to "atleast put lotion on her feet."    Currently in Pain?  Yes    Pain Score  --   pain avg is 4 but ranges from 3-9   Pain Location  Hip    Pain Orientation  Right    Pain Descriptors / Indicators  Aching;Tightness    Pain Type  Chronic pain    Pain Radiating Towards  N/T down Rt leg    Pain Onset  More than a month ago    Pain Frequency  Constant    Aggravating Factors   walking too far    Pain Relieving Factors  rest    Pain Onset  More than a month ago         [facility] PT Assessment - 05/01/19 0001      Assessment   Medical Diagnosis  Dx of R LE  weakness 2/2 L4 pedicle screw affecting the path of the L4 nerve root causing L4 radiculopathy. Pt had surgical replacement of hardware on the right side at L3 and L4 with robotically placed pedicle screws at those levels     Referring Provider (PT)  Dr. Kristeen Miss     Onset Date/Surgical Date  06/29/18    Next MD Visit  3 months out (October 2020)    Prior Therapy  neuro rehab PT, stopped back in march due to [place]   Has the patient fallen in the past 6 months  No      Prior Function  Level of [place]  Independent    Vocation  Unemployed      AROM   Overall AROM Comments  pain and tightness in hip IR/ER ROM    Lumbar Flexion  75%    Lumbar Extension  <25%    Lumbar - Right Side Bend  50%    Lumbar - Left Side Bend  50%    Lumbar - Right Rotation  75%    Lumbar - Left Rotation  75%      Strength   Overall Strength Comments  4-/5 Rt hip strength grossly vs 5/5 Lt hip strenght. Rt knee 4+/5 vs 5/5 Lt, Rt ankle DF 4+ vs 5 on Lt      Transfers   Five time sit to stand comments   16 seconds no UE support from standard chair      Ambulation/Gait   Gait Comments  slight antalgic gait with difficulty advancing Rt leg at times       Functional Gait  Assessment   Gait assessed   Yes    Gait Level Surface  Walks 20 ft in less than 5.5 sec, no assistive devices, good speed, no evidence for imbalance, normal gait pattern, deviates no more than 6 in outside of the 12 in walkway width.    Change in Gait Speed  Able to smoothly change walking speed without loss of balance or gait deviation. Deviate no more than 6 in outside of the 12 in walkway width.    Gait with Horizontal Head Turns  Performs head turns smoothly with no change in gait. Deviates no more than 6 in outside 12 in walkway width    Gait with Vertical Head Turns  Performs head turns with no change in gait. Deviates no more than 6 in outside 12 in walkway width.    Gait and Pivot Turn  Pivot turns safely  within 3 sec and stops quickly with no loss of balance.    Step Over Obstacle  Is able to step over one shoe box (4.5 in total height) but must slow down and adjust steps to clear box safely. May require verbal cueing.    Gait with Narrow Base of Support  Ambulates 4-7 steps.    Gait with Eyes Closed  Walks 20 ft, uses assistive device, slower speed, mild gait deviations, deviates 6-10 in outside 12 in walkway width. Ambulates 20 ft in less than 9 sec but greater than 7 sec.    Ambulating Backwards  Walks 20 ft, uses assistive device, slower speed, mild gait deviations, deviates 6-10 in outside 12 in walkway width.    Steps  Alternating feet, no rail.    Total Score  24                             PT Short Term Goals - 05/01/19 0816      PT SHORT TERM GOAL #1   Title  Pt will initiate HEP in order to indicate improved functional mobility and decreased fall risk.  (STG DUE 11/13)    Baseline  met 08/07/18    Time  4    Period  Weeks    Status  Achieved      PT SHORT TERM GOAL #2   Title  Pt will improve gait speed to >/= 2.57 ft/sec with LRAD in order to indicate decreased fall risk     Baseline  10/14: 2.27 ft/ sec with no AD  to 3.16 ft/sec without AD on 08/07/18    Time  4    Period  Weeks    Status  Achieved      PT SHORT TERM GOAL #3   Title  Pt will perform 5TSS </=17.57 secs with single UE support only in order to indicate decreased fall risk and improved functional strength.      Baseline  10/14: 22.74 from standard height chair with B UE assist to 16.38 secs without UE support.     Time  4    Period  Weeks    Status  Achieved      PT SHORT TERM GOAL #4   Title  Pt will improve BERG balance score to >/=34/56 in order to indicate decreased fall risk.     Baseline  10/14: 30/56 indicative of high fall risk potential to 51/56 on 08/07/18    Time  4    Period  Weeks    Status  Achieved      PT SHORT TERM GOAL #5   Title  Pt will negotiate up/down 8  steps with bilateral rails with supervision in order to indicate improved LE strength    Baseline  met with single rail reciprocal pattern 08/07/18    Time  4    Period  Weeks    Status  Achieved      PT SHORT TERM GOAL #6   Title  Pt will ambulate 300' on uneven surfaces, ramps, curbs, inclines, demonstrating ability to vary gait speed when cued using LRAD and therapist providing supervision to improve safety with community distances    met 08/07/18   Time  4    Period  Weeks    Status  Achieved      PT SHORT TERM GOAL #7   Title  Pt will participate in 6 min walk test to assess baseline endurance with functional mobility     Time  4    Period  Weeks    Status  Achieved        PT Long Term Goals - 05/01/19 0816      PT LONG TERM GOAL #1   Title  Pt will be independent with final HEP in order to indicate improved functional mobilty and decreased fall risk.   (UPDATED LTG DUE 12/06/18)    Baseline  updated program 8/10    Time  4    Period  Weeks    Status  On-going      PT LONG TERM GOAL #2   Title  Pt will improve gait speed to >/=4.10 ft/sec w/ LRAD in order to indicate decreased fall risk and safe community ambulator.     Baseline  10/24/18: 3.35 ft/sec no AD     Time  4    Period  Weeks    Status  On-going      PT LONG TERM GOAL #3   Title  Pt will perform 5TSS in </=13 secs without UE support in order to indicate decreased fall risk and improved functional strength.     Baseline  16 sec 8/10    Status  On-going      PT LONG TERM GOAL #4   Title  Pt will improve FGA to >/=25/30 in order to indicate decreased fall risk.     Baseline  04/30/19: 24/30 scored today    Time  4    Period  Weeks    Status  On-going      PT LONG  TERM GOAL #5   Title  Pt will negotiate up/down 8 steps without rail in reciprocal pattern while carrying item (ie grocery bags) in order to indicate improved safety with home entry/exit.     Baseline  10/26/18: continues to need intermittent touch  to rails with reciprocal pattern with supervision    Time  4    Period  Weeks    Status  On-going      PT LONG TERM GOAL #6   Title  Pt will ambulate x 500' outdoors/indoors without AD with less antaglic gait pattern in order to indicate improved fluidity of gait and decreased pain.     Baseline  still with slight antalgic gait and difficulty advancing Rt leg at times    Status  On-going      PT LONG TERM GOAL #7   Title  Pt will improve 6 min walk test to1589' indicating improvement with functional endurance    10/25: Patient performs 675 with rollator to 818' 08/10/18   Baseline  did not test today due to time contraints    Time  4    Period  Weeks    Status  On-going            Plan - 05/01/19 0865    Clinical Impression Statement  RE performed today as she had to stop PT in March due to Covid 19. She has new referral from MD to continue PT for same diagnosis so RE performed today. She has regressed some in strength, balance, and overall gait. She has met all of her STG but all LTG are still ongoing. She will benefit from skilled PT to address her deficits in gait, balance, Rt leg weakness and lumbar pain.    Rehab Potential  Good    Clinical Impairments Affecting Rehab Potential  Pt is motivated to improve. Strength gains may be limited 2/2 to nerve regeneration process     PT Frequency  2x / week    PT Duration  6 weeks    PT Treatment/Interventions  ADLs/Self Care Home Management;Aquatic Therapy;DME Instruction;Gait training;Stair training;Functional mobility training;Therapeutic activities;Therapeutic exercise;Balance training;Neuromuscular re-education;Patient/family education;Electrical Stimulation;Passive range of motion;Energy conservation;Manual techniques;Dry needling;Vestibular    PT Next Visit Plan  continue with high level balance/agility drills/plyometric drills; hip strengthening    PT Home Exercise Plan   [id number]     Consulted and Agree with Plan of Care  Patient        Patient will benefit from skilled therapeutic intervention in order to improve the following deficits and impairments:  Abnormal gait, Decreased activity tolerance, Decreased balance, Decreased coordination, Decreased endurance, Decreased mobility, Decreased range of motion, Decreased strength, Impaired perceived functional ability, Impaired flexibility, Impaired sensation, Pain, Decreased knowledge of use of DME, Decreased knowledge of precautions, Obesity  Visit Diagnosis: 1. Muscle weakness (generalized)   2. Unsteadiness on feet   3. Chronic midline low back pain without sciatica        Problem List Patient Active Problem List   Diagnosis Date Noted  . Abnormality of gait 02/22/2019  . Lumbosacral radiculopathy at L4 06/09/2018  . Generalized anxiety disorder   . Pain   . Fall   . Labile blood pressure   . Neuropathic pain   . Therapeutic opioid induced constipation   . Urinary retention   . Lumbar radiculopathy 02/21/2018  . PCOS (polycystic ovarian syndrome)   . Morbid obesity ([place])   . Depression   . Generalized OA   . Postoperative pain   .  Acute blood loss anemia   . Status post surgery 02/17/2018  . Lumbar radiculopathy, chronic 02/17/2018  . Primary localized osteoarthritis of right hip 04/21/2016  . Sinusitis 06/25/2015  . Cough 06/25/2015  . Essential hypertension 04/03/2015  . Dehydration 04/03/2015  . CAP (community acquired pneumonia) 03/31/2015  . Right lower lobe pneumonia ([place]) 03/31/2015  . Fever 03/31/2015  . Pregnancy 04/29/2014  . Miscarriage   . Incomplete miscarriage 04/02/2011    Class: Acute  . 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 [place], [place], [address] Phone: [phone]   Fax:  [phone]  Name: HENNA DERDERIAN MRN: [id number] Date of Birth: 09-28-1975

## 2019-05-02 ENCOUNTER — Other Ambulatory Visit: Payer: Self-pay

## 2019-05-02 ENCOUNTER — Ambulatory Visit: Payer: Medicare Other | Admitting: Physical Therapy

## 2019-05-02 DIAGNOSIS — G8929 Other chronic pain: Secondary | ICD-10-CM

## 2019-05-02 DIAGNOSIS — M545 Low back pain, unspecified: Secondary | ICD-10-CM

## 2019-05-02 DIAGNOSIS — M6281 Muscle weakness (generalized): Secondary | ICD-10-CM

## 2019-05-02 DIAGNOSIS — R2681 Unsteadiness on feet: Secondary | ICD-10-CM

## 2019-05-02 NOTE — Therapy (Signed)
[facility] [address] [place], [place], [address] Phone: [phone]   Fax:  [phone]  Physical Therapy Treatment  Patient Details  Name: Ashley [name] MRN: [id number] Date of Birth: March 16, 1976 Referring Provider (PT): Dr. Kristeen Miss    Encounter Date: 05/02/2019  PT End of Session - 05/02/19 1800    Visit Number  25    Number of Visits  72   RE updated POC   Date for PT Re-Evaluation  06/11/19   per updated POC   Authorization Type  [place] 10th visit progress note    PT Start Time  1615    PT Stop Time  1700    PT Time Calculation (min)  45 min    Activity Tolerance  Patient tolerated treatment well    Behavior During Therapy  [facility] for tasks assessed/performed       Past Medical History:  Diagnosis Date  . Arthritis    knees, back  . 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 Complication of anesthesia   . Depression   . History of pneumonia   . Hypertension   . Lumbar spondylosis   . PCOS (polycystic ovarian syndrome)   . Pneumonia    2016  . PONV (postoperative nausea and vomiting)     Past Surgical History:  Procedure Laterality Date  . ABDOMINAL EXPOSURE N/A 02/17/2018   Procedure: ABDOMINAL EXPOSURE;  Surgeon: Rosetta Posner, MD;  Location: [facility] OR;  Service: Vascular;  Laterality: N/A;  . ANKLE SURGERY     x 2  . ANTERIOR LAT LUMBAR FUSION N/A 02/17/2018   Procedure: Lumbar three-four Lumbar four-five  Anterolateral lumbar interbody fusion;  Surgeon: Kristeen Miss, MD;  Location: [place];  Service: Neurosurgery;  Laterality: N/A;  . ANTERIOR LUMBAR FUSION N/A 02/17/2018   Procedure: Lumbar five-Sacral one Anterior lumbar interbody fusion;  Surgeon: Kristeen Miss, MD;  Location: [place];  Service: Neurosurgery;  Laterality: N/A;  . APPLICATION OF ROBOTIC ASSISTANCE FOR SPINAL PROCEDURE N/A 02/17/2018   Procedure: APPLICATION OF ROBOTIC ASSISTANCE FOR SPINAL PROCEDURE;  Surgeon:  Kristeen Miss, MD;  Location: [place];  Service: Neurosurgery;  Laterality: N/A;  . APPLICATION OF ROBOTIC ASSISTANCE FOR SPINAL PROCEDURE N/A 06/09/2018   Procedure: APPLICATION OF ROBOTIC ASSISTANCE FOR SPINAL PROCEDURE;  Surgeon: Kristeen Miss, MD;  Location: [place];  Service: Neurosurgery;  Laterality: N/A;  . BACK SURGERY     2017  discectomy  . CARPAL TUNNEL RELEASE Left 07/04/2014   Procedure: LEFT CARPAL TUNNEL RELEASE;  Surgeon: Ninetta Lights, MD;  Location: [place];  Service: Orthopedics;  Laterality: Left;  . CARPAL TUNNEL RELEASE Right 08/08/2014   Procedure: RIGHT CARPAL TUNNEL RELEASE;  Surgeon: Ninetta Lights, MD;  Location: [place];  Service: Orthopedics;  Laterality: Right;  . DILATION AND CURETTAGE OF UTERUS    . DILATION AND EVACUATION  04/02/2011   Procedure: DILATATION AND EVACUATION (D&E);  Surgeon: Luz Lex, MD;  Location: [facility];  Service: Gynecology;  Laterality: N/A;  dvt left mid thigh  . DORSAL COMPARTMENT RELEASE Left 07/04/2014   Procedure: LEFT DEQUERVAINS;  Surgeon: Ninetta Lights, MD;  Location: [place];  Service: Orthopedics;  Laterality: Left;  . ENDOSCOPIC PLANTAR FASCIOTOMY Left 02/01/2002  . HARDWARE REMOVAL Right 06/09/2018   Procedure: Repositioning of Right Lumbar three Right lumbar  four pedicle screw with MAZOR;  Surgeon: Kristeen Miss, MD;  Location: [facility];  Service: Neurosurgery;  Laterality: Right;  . HYSTEROSCOPY W/D&C  07/17/2010   with exc. endometrial polyps  . KNEE ARTHROSCOPY Right 03/07/2003; 01/14/2005; 08/31/2006  . KNEE ARTHROSCOPY Left 03/21/2003; 11/26/2004  . LUMBAR PERCUTANEOUS PEDICLE SCREW 3 LEVEL N/A 02/17/2018   Procedure: LUMBAR PERCUTANEOUS PEDICLE SCREW PLACEMENT LUMBAR THREE-SACRAL ONE;  Surgeon: Kristeen Miss, MD;  Location: [place];  Service: Neurosurgery;  Laterality: N/A;  . SHOULDER ARTHROSCOPY Right   . TOE SURGERY Left 01/10/2003   claw toe correction 2nd, 3rd, 4th toes  .  TOTAL HIP ARTHROPLASTY Right 04/21/2016  . TOTAL HIP ARTHROPLASTY Right 04/21/2016   Procedure: TOTAL HIP ARTHROPLASTY ANTERIOR APPROACH;  Surgeon: Ninetta Lights, MD;  Location: [facility];  Service: Orthopedics;  Laterality: Right;  . TOTAL KNEE ARTHROPLASTY Left 01/21/2010  . TOTAL KNEE ARTHROPLASTY Right 08/26/2008    There were no vitals filed for this visit.  Subjective Assessment - 05/02/19 1632    Subjective  Back and legs feel a litte better today, but still having pain in Rt hip.(does not rate intensity)    Pertinent History  Past Surgical Hx: B TKA (2009, 2011) and R THA 2017. PMH includes; OA, Morbid Obesity, Depression, HTN, Lumbar Spondylosis, Polycystic Ovarian Syndrome, Pneumonia, and PONV    Limitations  Standing;Walking;Lifting;Sitting    How long can you sit comfortably?  Sitting is uncomfortable. No amount of time specified.     How long can you stand comfortably?  about 5-10 mins    How long can you walk comfortably?  Short period of time with no time period specified    Patient Stated Goals  Reports goal of being more mobile with increased flexibility. Wants to "atleast put lotion on her feet."    Pain Onset  More than a month ago    Pain Onset  More than a month ago                       [facility] Adult PT Treatment/Exercise - 05/02/19 0001      Exercises   Exercises  Lumbar      Lumbar Exercises: Stretches   Active Hamstring Stretch  Right;Left;2 reps;30 seconds    Active Hamstring Stretch Limitations  seated    Single Knee to Chest Stretch  Right;Left;2 reps;30 seconds    Piriformis Stretch  Right;Left;2 reps;30 seconds    Other Lumbar Stretch Exercise  hip flexor stretch standing at step      Lumbar Exercises: Aerobic   Tread Mill  6:43 for .25 miles 2-2.4 mph      Lumbar Exercises: Machines for Strengthening   Cybex Leg Press  60 lbs bilat X 15      Lumbar Exercises: Supine   Bridge  10 reps    Bridge Limitations  5 sec      Knee/Hip Exercises:  Standing   Heel Raises Limitations  heel toe raises X 15 ea    Lateral Step Up  Both;10 reps;Hand Hold: 2;Step Height: 6"    Forward Step Up  Right;Left;10 reps;Hand Hold: 2;Step Height: 6"    SLS  3 X 10 sec bilat               PT Short Term Goals - 05/01/19 0816      PT SHORT TERM GOAL #1   Title  Pt will initiate HEP in order to indicate improved functional mobility and decreased fall risk.  (STG DUE 11/13)    Baseline  met 08/07/18  Time  4    Period  Weeks    Status  Achieved      PT SHORT TERM GOAL #2   Title  Pt will improve gait speed to >/= 2.57 ft/sec with LRAD in order to indicate decreased fall risk     Baseline  10/14: 2.27 ft/ sec with no AD to 3.16 ft/sec without AD on 08/07/18    Time  4    Period  Weeks    Status  Achieved      PT SHORT TERM GOAL #3   Title  Pt will perform 5TSS </=17.57 secs with single UE support only in order to indicate decreased fall risk and improved functional strength.      Baseline  10/14: 22.74 from standard height chair with B UE assist to 16.38 secs without UE support.     Time  4    Period  Weeks    Status  Achieved      PT SHORT TERM GOAL #4   Title  Pt will improve BERG balance score to >/=34/56 in order to indicate decreased fall risk.     Baseline  10/14: 30/56 indicative of high fall risk potential to 51/56 on 08/07/18    Time  4    Period  Weeks    Status  Achieved      PT SHORT TERM GOAL #5   Title  Pt will negotiate up/down 8 steps with bilateral rails with supervision in order to indicate improved LE strength    Baseline  met with single rail reciprocal pattern 08/07/18    Time  4    Period  Weeks    Status  Achieved      PT SHORT TERM GOAL #6   Title  Pt will ambulate 300' on uneven surfaces, ramps, curbs, inclines, demonstrating ability to vary gait speed when cued using LRAD and therapist providing supervision to improve safety with community distances    met 08/07/18   Time  4    Period  Weeks     Status  Achieved      PT SHORT TERM GOAL #7   Title  Pt will participate in 6 min walk test to assess baseline endurance with functional mobility     Time  4    Period  Weeks    Status  Achieved        PT Long Term Goals - 05/01/19 0816      PT LONG TERM GOAL #1   Title  Pt will be independent with final HEP in order to indicate improved functional mobilty and decreased fall risk.   (UPDATED LTG DUE 12/06/18)    Baseline  updated program 8/10    Time  4    Period  Weeks    Status  On-going      PT LONG TERM GOAL #2   Title  Pt will improve gait speed to >/=4.10 ft/sec w/ LRAD in order to indicate decreased fall risk and safe community ambulator.     Baseline  10/24/18: 3.35 ft/sec no AD     Time  4    Period  Weeks    Status  On-going      PT LONG TERM GOAL #3   Title  Pt will perform 5TSS in </=13 secs without UE support in order to indicate decreased fall risk and improved functional strength.     Baseline  16 sec 8/10    Status  On-going  PT LONG TERM GOAL #4   Title  Pt will improve FGA to >/=25/30 in order to indicate decreased fall risk.     Baseline  04/30/19: 24/30 scored today    Time  4    Period  Weeks    Status  On-going      PT LONG TERM GOAL #5   Title  Pt will negotiate up/down 8 steps without rail in reciprocal pattern while carrying item (ie grocery bags) in order to indicate improved safety with home entry/exit.     Baseline  10/26/18: continues to need intermittent touch to rails with reciprocal pattern with supervision    Time  4    Period  Weeks    Status  On-going      PT LONG TERM GOAL #6   Title  Pt will ambulate x 500' outdoors/indoors without AD with less antaglic gait pattern in order to indicate improved fluidity of gait and decreased pain.     Baseline  still with slight antalgic gait and difficulty advancing Rt leg at times    Status  On-going      PT LONG TERM GOAL #7   Title  Pt will improve 6 min walk test to1589' indicating  improvement with functional endurance    10/25: Patient performs 675 with rollator to 818' 08/10/18   Baseline  did not test today due to time contraints    Time  4    Period  Weeks    Status  On-going            Plan - 05/02/19 1801    Clinical Impression Statement  She was able to progress with strength and stretching today with good tolerance with focus on Rt leg strength. PT will continue to progress as able.    Rehab Potential  Good    Clinical Impairments Affecting Rehab Potential  Pt is motivated to improve. Strength gains may be limited 2/2 to nerve regeneration process     PT Frequency  2x / week    PT Duration  6 weeks    PT Treatment/Interventions  ADLs/Self Care Home Management;Aquatic Therapy;DME Instruction;Gait training;Stair training;Functional mobility training;Therapeutic activities;Therapeutic exercise;Balance training;Neuromuscular re-education;Patient/family education;Electrical Stimulation;Passive range of motion;Energy conservation;Manual techniques;Dry needling;Vestibular    PT Next Visit Plan  continue with high level balance/agility drills/plyometric drills; hip strengthening    PT Home Exercise Plan   [id number]     Consulted and Agree with Plan of Care  Patient       Patient will benefit from skilled therapeutic intervention in order to improve the following deficits and impairments:  Abnormal gait, Decreased activity tolerance, Decreased balance, Decreased coordination, Decreased endurance, Decreased mobility, Decreased range of motion, Decreased strength, Impaired perceived functional ability, Impaired flexibility, Impaired sensation, Pain, Decreased knowledge of use of DME, Decreased knowledge of precautions, Obesity  Visit Diagnosis: 1. Muscle weakness (generalized)   2. Unsteadiness on feet   3. Chronic midline low back pain without sciatica        Problem List Patient Active Problem List   Diagnosis Date Noted  . Abnormality of gait 02/22/2019   . Lumbosacral radiculopathy at L4 06/09/2018  . Generalized anxiety disorder   . Pain   . Fall   . Labile blood pressure   . Neuropathic pain   . Therapeutic opioid induced constipation   . Urinary retention   . Lumbar radiculopathy 02/21/2018  . PCOS (polycystic ovarian syndrome)   . Morbid obesity ([place])   . Depression   .  Generalized OA   . Postoperative pain   . Acute blood loss anemia   . Status post surgery 02/17/2018  . Lumbar radiculopathy, chronic 02/17/2018  . Primary localized osteoarthritis of right hip 04/21/2016  . Sinusitis 06/25/2015  . Cough 06/25/2015  . Essential hypertension 04/03/2015  . Dehydration 04/03/2015  . CAP (community acquired pneumonia) 03/31/2015  . Right lower lobe pneumonia ([place]) 03/31/2015  . Fever 03/31/2015  . Pregnancy 04/29/2014  . Miscarriage   . Incomplete miscarriage 04/02/2011    Class: Acute  . 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

## 2019-05-07 ENCOUNTER — Encounter: Payer: Medicare Other | Attending: Physical Medicine & Rehabilitation | Admitting: Physical Medicine & Rehabilitation

## 2019-05-07 ENCOUNTER — Encounter: Payer: Self-pay | Admitting: Physical Medicine & Rehabilitation

## 2019-05-07 ENCOUNTER — Ambulatory Visit: Payer: Medicare Other | Admitting: Physical Therapy

## 2019-05-07 ENCOUNTER — Other Ambulatory Visit: Payer: Self-pay

## 2019-05-07 VITALS — BP 132/87 | HR 84 | Temp 98.7°F | Ht 71.0 in | Wt 300.0 lb

## 2019-05-07 DIAGNOSIS — R269 Unspecified abnormalities of gait and mobility: Secondary | ICD-10-CM | POA: Diagnosis not present

## 2019-05-07 DIAGNOSIS — M792 Neuralgia and neuritis, unspecified: Secondary | ICD-10-CM | POA: Diagnosis not present

## 2019-05-07 DIAGNOSIS — M2142 Flat foot [pes planus] (acquired), left foot: Secondary | ICD-10-CM | POA: Insufficient documentation

## 2019-05-07 DIAGNOSIS — M5416 Radiculopathy, lumbar region: Secondary | ICD-10-CM

## 2019-05-07 DIAGNOSIS — G5711 Meralgia paresthetica, right lower limb: Secondary | ICD-10-CM

## 2019-05-07 DIAGNOSIS — G8929 Other chronic pain: Secondary | ICD-10-CM

## 2019-05-07 DIAGNOSIS — M2140 Flat foot [pes planus] (acquired), unspecified foot: Secondary | ICD-10-CM | POA: Insufficient documentation

## 2019-05-07 DIAGNOSIS — R52 Pain, unspecified: Secondary | ICD-10-CM

## 2019-05-07 DIAGNOSIS — M6281 Muscle weakness (generalized): Secondary | ICD-10-CM

## 2019-05-07 DIAGNOSIS — M545 Low back pain, unspecified: Secondary | ICD-10-CM

## 2019-05-07 DIAGNOSIS — G8918 Other acute postprocedural pain: Secondary | ICD-10-CM

## 2019-05-07 DIAGNOSIS — M2141 Flat foot [pes planus] (acquired), right foot: Secondary | ICD-10-CM | POA: Insufficient documentation

## 2019-05-07 DIAGNOSIS — R2681 Unsteadiness on feet: Secondary | ICD-10-CM

## 2019-05-07 NOTE — Therapy (Signed)
[place] [facility] [address] [place], [place], [address] Phone: [phone]   Fax:  [phone]  Physical Therapy Treatment  Patient Details  Name: Ashley [name] MRN: [id number] Date of Birth: 03-Jun-1976 Referring Provider (PT): Dr. Kristeen Miss    Encounter Date: 05/07/2019  PT End of Session - 05/07/19 1604    Visit Number  26    Number of Visits  15   RE updated POC   Date for PT Re-Evaluation  06/11/19   per updated POC   Authorization Type  [employer] 10th visit progress note    PT Start Time  1530    PT Stop Time  1618    PT Time Calculation (min)  48 min    Activity Tolerance  Patient tolerated treatment well    Behavior During Therapy  [facility] for tasks assessed/performed       Past Medical History:  Diagnosis Date  . Arthritis    knees, back  . 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 Complication of anesthesia   . Depression   . History of pneumonia   . Hypertension   . Lumbar spondylosis   . PCOS (polycystic ovarian syndrome)   . Pneumonia    2016  . PONV (postoperative nausea and vomiting)     Past Surgical History:  Procedure Laterality Date  . ABDOMINAL EXPOSURE N/A 02/17/2018   Procedure: ABDOMINAL EXPOSURE;  Surgeon: Rosetta Posner, MD;  Location: [facility] OR;  Service: Vascular;  Laterality: N/A;  . ANKLE SURGERY     x 2  . ANTERIOR LAT LUMBAR FUSION N/A 02/17/2018   Procedure: Lumbar three-four Lumbar four-five  Anterolateral lumbar interbody fusion;  Surgeon: Kristeen Miss, MD;  Location: [place];  Service: Neurosurgery;  Laterality: N/A;  . ANTERIOR LUMBAR FUSION N/A 02/17/2018   Procedure: Lumbar five-Sacral one Anterior lumbar interbody fusion;  Surgeon: Kristeen Miss, MD;  Location: [place];  Service: Neurosurgery;  Laterality: N/A;  . APPLICATION OF ROBOTIC ASSISTANCE FOR SPINAL PROCEDURE N/A 02/17/2018   Procedure: APPLICATION OF ROBOTIC ASSISTANCE FOR SPINAL PROCEDURE;  Surgeon:  Kristeen Miss, MD;  Location: [place];  Service: Neurosurgery;  Laterality: N/A;  . APPLICATION OF ROBOTIC ASSISTANCE FOR SPINAL PROCEDURE N/A 06/09/2018   Procedure: APPLICATION OF ROBOTIC ASSISTANCE FOR SPINAL PROCEDURE;  Surgeon: Kristeen Miss, MD;  Location: [place];  Service: Neurosurgery;  Laterality: N/A;  . BACK SURGERY     2017  discectomy  . CARPAL TUNNEL RELEASE Left 07/04/2014   Procedure: LEFT CARPAL TUNNEL RELEASE;  Surgeon: Ninetta Lights, MD;  Location: [place];  Service: Orthopedics;  Laterality: Left;  . CARPAL TUNNEL RELEASE Right 08/08/2014   Procedure: RIGHT CARPAL TUNNEL RELEASE;  Surgeon: Ninetta Lights, MD;  Location: [place];  Service: Orthopedics;  Laterality: Right;  . DILATION AND CURETTAGE OF UTERUS    . DILATION AND EVACUATION  04/02/2011   Procedure: DILATATION AND EVACUATION (D&E);  Surgeon: Luz Lex, MD;  Location: [facility];  Service: Gynecology;  Laterality: N/A;  dvt left mid thigh  . DORSAL COMPARTMENT RELEASE Left 07/04/2014   Procedure: LEFT DEQUERVAINS;  Surgeon: Ninetta Lights, MD;  Location: [facility];  Service: Orthopedics;  Laterality: Left;  . ENDOSCOPIC PLANTAR FASCIOTOMY Left 02/01/2002  . HARDWARE REMOVAL Right 06/09/2018   Procedure: Repositioning of Right Lumbar three Right lumbar  four pedicle screw with MAZOR;  Surgeon: Kristeen Miss, MD;  Location: [place];  Service: Neurosurgery;  Laterality: Right;  . HYSTEROSCOPY W/D&C  07/17/2010   with exc. endometrial polyps  . KNEE ARTHROSCOPY Right 03/07/2003; 01/14/2005; 08/31/2006  . KNEE ARTHROSCOPY Left 03/21/2003; 11/26/2004  . LUMBAR PERCUTANEOUS PEDICLE SCREW 3 LEVEL N/A 02/17/2018   Procedure: LUMBAR PERCUTANEOUS PEDICLE SCREW PLACEMENT LUMBAR THREE-SACRAL ONE;  Surgeon: Kristeen Miss, MD;  Location: [facility];  Service: Neurosurgery;  Laterality: N/A;  . SHOULDER ARTHROSCOPY Right   . TOE SURGERY Left 01/10/2003   claw toe correction 2nd, 3rd, 4th toes  .  TOTAL HIP ARTHROPLASTY Right 04/21/2016  . TOTAL HIP ARTHROPLASTY Right 04/21/2016   Procedure: TOTAL HIP ARTHROPLASTY ANTERIOR APPROACH;  Surgeon: Ninetta Lights, MD;  Location: [facility];  Service: Orthopedics;  Laterality: Right;  . TOTAL KNEE ARTHROPLASTY Left 01/21/2010  . TOTAL KNEE ARTHROPLASTY Right 08/26/2008    There were no vitals filed for this visit.  Subjective Assessment - 05/07/19 1554    Subjective  I am discouraged about my weight, I was 300 lbs at MD office. My Rt hip is hurting me more 5/10 pain    Pertinent History  Past Surgical Hx: B TKA (2009, 2011) and R THA 2017. PMH includes; OA, Morbid Obesity, Depression, HTN, Lumbar Spondylosis, Polycystic Ovarian Syndrome, Pneumonia, and PONV    Limitations  Standing;Walking;Lifting;Sitting    How long can you sit comfortably?  Sitting is uncomfortable. No amount of time specified.     How long can you stand comfortably?  about 5-10 mins    How long can you walk comfortably?  Short period of time with no time period specified    Patient Stated Goals  Reports goal of being more mobile with increased flexibility. Wants to "atleast put lotion on her feet."    Pain Onset  More than a month ago    Pain Onset  More than a month ago                       [facility] Adult PT Treatment/Exercise - 05/07/19 0001      Exercises   Exercises  Lumbar      Lumbar Exercises: Stretches   Active Hamstring Stretch  Right;Left;2 reps;30 seconds    Active Hamstring Stretch Limitations  seated    Single Knee to Chest Stretch  Right;Left;2 reps;30 seconds    Piriformis Stretch  Right;Left;2 reps;30 seconds    Other Lumbar Stretch Exercise  hip flexor stretch standing at step      Lumbar Exercises: Aerobic   Tread Mill  --    Nustep  sci fit L3 X 6 min UE/LE      Lumbar Exercises: Machines for Strengthening   Cybex Leg Press  80 lbs bilat 2X10 then rest break and dropped to 50 lbs for Rt leg only X 15      Lumbar Exercises: Supine    Bridge  10 reps    Bridge Limitations  5 sec      Knee/Hip Exercises: Standing   Heel Raises Limitations  heel toe raises X 15 ea    Lateral Step Up  --    Forward Step Up  --    SLS  Rt leg 3 X 10 sec    Other Standing Knee Exercises  hip abd X 20 bilat      Manual Therapy   Manual therapy comments  STM, PROM, LAD to Rt hip               PT Short  Term Goals - 05/01/19 0816      PT SHORT TERM GOAL #1   Title  Pt will initiate HEP in order to indicate improved functional mobility and decreased fall risk.  (STG DUE 11/13)    Baseline  met 08/07/18    Time  4    Period  Weeks    Status  Achieved      PT SHORT TERM GOAL #2   Title  Pt will improve gait speed to >/= 2.57 ft/sec with LRAD in order to indicate decreased fall risk     Baseline  10/14: 2.27 ft/ sec with no AD to 3.16 ft/sec without AD on 08/07/18    Time  4    Period  Weeks    Status  Achieved      PT SHORT TERM GOAL #3   Title  Pt will perform 5TSS </=17.57 secs with single UE support only in order to indicate decreased fall risk and improved functional strength.      Baseline  10/14: 22.74 from standard height chair with B UE assist to 16.38 secs without UE support.     Time  4    Period  Weeks    Status  Achieved      PT SHORT TERM GOAL #4   Title  Pt will improve BERG balance score to >/=34/56 in order to indicate decreased fall risk.     Baseline  10/14: 30/56 indicative of high fall risk potential to 51/56 on 08/07/18    Time  4    Period  Weeks    Status  Achieved      PT SHORT TERM GOAL #5   Title  Pt will negotiate up/down 8 steps with bilateral rails with supervision in order to indicate improved LE strength    Baseline  met with single rail reciprocal pattern 08/07/18    Time  4    Period  Weeks    Status  Achieved      PT SHORT TERM GOAL #6   Title  Pt will ambulate 300' on uneven surfaces, ramps, curbs, inclines, demonstrating ability to vary gait speed when cued using LRAD and  therapist providing supervision to improve safety with community distances    met 08/07/18   Time  4    Period  Weeks    Status  Achieved      PT SHORT TERM GOAL #7   Title  Pt will participate in 6 min walk test to assess baseline endurance with functional mobility     Time  4    Period  Weeks    Status  Achieved        PT Long Term Goals - 05/01/19 0816      PT LONG TERM GOAL #1   Title  Pt will be independent with final HEP in order to indicate improved functional mobilty and decreased fall risk.   (UPDATED LTG DUE 12/06/18)    Baseline  updated program 8/10    Time  4    Period  Weeks    Status  On-going      PT LONG TERM GOAL #2   Title  Pt will improve gait speed to >/=4.10 ft/sec w/ LRAD in order to indicate decreased fall risk and safe community ambulator.     Baseline  10/24/18: 3.35 ft/sec no AD     Time  4    Period  Weeks    Status  On-going  PT LONG TERM GOAL #3   Title  Pt will perform 5TSS in </=13 secs without UE support in order to indicate decreased fall risk and improved functional strength.     Baseline  16 sec 8/10    Status  On-going      PT LONG TERM GOAL #4   Title  Pt will improve FGA to >/=25/30 in order to indicate decreased fall risk.     Baseline  04/30/19: 24/30 scored today    Time  4    Period  Weeks    Status  On-going      PT LONG TERM GOAL #5   Title  Pt will negotiate up/down 8 steps without rail in reciprocal pattern while carrying item (ie grocery bags) in order to indicate improved safety with home entry/exit.     Baseline  10/26/18: continues to need intermittent touch to rails with reciprocal pattern with supervision    Time  4    Period  Weeks    Status  On-going      PT LONG TERM GOAL #6   Title  Pt will ambulate x 500' outdoors/indoors without AD with less antaglic gait pattern in order to indicate improved fluidity of gait and decreased pain.     Baseline  still with slight antalgic gait and difficulty advancing Rt leg at  times    Status  On-going      PT LONG TERM GOAL #7   Title  Pt will improve 6 min walk test to1589' indicating improvement with functional endurance    10/25: Patient performs 675 with rollator to 818' 08/10/18   Baseline  did not test today due to time contraints    Time  4    Period  Weeks    Status  On-going            Plan - 05/07/19 1624    Clinical Impression Statement  Had more Rt hip pain today which limited session some with exercise (did not perform step ups or walk on treadmill, instead did sci fit bike and leg press). She was trialed with manual therapy for STM, PROM, and long axis distraction to reduce hip pain. a    Rehab Potential  Good    Clinical Impairments Affecting Rehab Potential  Pt is motivated to improve. Strength gains may be limited 2/2 to nerve regeneration process     PT Frequency  2x / week    PT Duration  6 weeks    PT Treatment/Interventions  ADLs/Self Care Home Management;Aquatic Therapy;DME Instruction;Gait training;Stair training;Functional mobility training;Therapeutic activities;Therapeutic exercise;Balance training;Neuromuscular re-education;Patient/family education;Electrical Stimulation;Passive range of motion;Energy conservation;Manual techniques;Dry needling;Vestibular    PT Next Visit Plan  continue with high level balance/agility drills/plyometric drills; hip strengthening    PT Home Exercise Plan   [id number]     Consulted and Agree with Plan of Care  Patient       Patient will benefit from skilled therapeutic intervention in order to improve the following deficits and impairments:  Abnormal gait, Decreased activity tolerance, Decreased balance, Decreased coordination, Decreased endurance, Decreased mobility, Decreased range of motion, Decreased strength, Impaired perceived functional ability, Impaired flexibility, Impaired sensation, Pain, Decreased knowledge of use of DME, Decreased knowledge of precautions, Obesity  Visit Diagnosis: 1.  Muscle weakness (generalized)   2. Unsteadiness on feet   3. Chronic midline low back pain without sciatica   4. Pain        Problem List Patient Active Problem List  Diagnosis Date Noted  . Flat foot 05/07/2019  . Abnormality of gait 02/22/2019  . Lumbosacral radiculopathy at L4 06/09/2018  . Generalized anxiety disorder   . Pain   . Fall   . Labile blood pressure   . Neuropathic pain   . Therapeutic opioid induced constipation   . Urinary retention   . Lumbar radiculopathy 02/21/2018  . PCOS (polycystic ovarian syndrome)   . Morbid obesity ([place])   . Depression   . Generalized OA   . Postoperative pain   . Acute blood loss anemia   . Status post surgery 02/17/2018  . Lumbar radiculopathy, chronic 02/17/2018  . Primary localized osteoarthritis of right hip 04/21/2016  . Sinusitis 06/25/2015  . Cough 06/25/2015  . Essential hypertension 04/03/2015  . Dehydration 04/03/2015  . CAP (community acquired pneumonia) 03/31/2015  . Right lower lobe pneumonia ([place]) 03/31/2015  . Fever 03/31/2015  . Pregnancy 04/29/2014  . Miscarriage   . Incomplete miscarriage 04/02/2011    Class: Acute  . Deep vein thrombosis (DVT) ([place]) 04/02/2011    Class: Chronic    Silvestre Mesi 05/07/2019, 4:28 PM  [place] [place] [address] [place], [place], [address] Phone: [phone]   Fax:  [phone]  Name: Ashley [name] MRN: [id number] Date of Birth: May 30, 1976

## 2019-05-07 NOTE — Progress Notes (Signed)
Subjective:    Patient ID: Ashley [name], female    DOB: Nov 30, 1975, 43 y.o.   MRN: [id number]  HPI Right-handed female with history of hypertension, morbid obesity, bilateral TKA and chronic radicular back pain presents for follow up for  lumbar spondylosis with radiculopathy s/pdecompression L3-4 and L4-5 with anterior exposure of L5-S1 as well as percutaneous fixation L3 to the sacrum 02/17/2018 and resulting right L3 radiculopathy s/p hardware replacement and revision.  Last clinic visit  02/22/2019.  Since that time, she started therapies last week. She is following up with Neurosurg next month. She is tolerating Lyrica and Baclofen, Lidoderm, and TENS. She continues to follow up with Psychology and [facility]. She states she is not losing weight.  She is not exercising. Denies falls. She states she was told orthosis would not be covered by insurance because patient was not diabetic. She has not obtained OTC insoles.   Pain Inventory Average Pain 4 Pain Right Now 6 My pain is tingling and aching  In the last 24 hours, has pain interfered with the following? General activity 4 Relation with others 3 Enjoyment of life 3 What TIME of day is your pain at its worst? all Sleep (in general) Fair  Pain is worse with: bending, sitting and inactivity Pain improves with: heat/ice and TENS Relief from Meds: 6  Mobility walk without assistance ability to climb steps?  yes do you drive?  yes  Function disabled: date disabled .  Neuro/Psych weakness numbness tingling  Prior Studies Any changes since last visit?  no  Physicians involved in your care Any changes since last visit?  no   Family History  Problem Relation Age of Onset  . Cancer Maternal Grandmother        UTERINE  . Cancer Paternal Grandmother        breast  . Breast cancer Paternal Grandmother   . Diabetes Father   . Cancer Father        COLON  . Heart disease Father   . Kidney disease Father    dialysis  . Diabetes Sister   . Breast cancer Cousin    Social History   Socioeconomic History  . Marital status: Legally Separated    Spouse name: Not on file  . Number of children: Not on file  . Years of education: Not on file  . Highest education level: Not on file  Occupational History  . Not on file  Social Needs  . Financial resource strain: Not on file  . Food insecurity    Worry: Not on file    Inability: Not on file  . Transportation needs    Medical: Not on file    Non-medical: Not on file  Tobacco Use  . Smoking status: Never Smoker  . Smokeless tobacco: Never Used  Substance and Sexual Activity  . Alcohol use: No    Alcohol/week: 0.0 standard drinks  . Drug use: No  . Sexual activity: Not on file  Lifestyle  . Physical activity    Days per week: Not on file    Minutes per session: Not on file  . Stress: Not on file  Relationships  . Social Herbalist on phone: Not on file    Gets together: Not on file    Attends religious service: Not on file    Active member of club or organization: Not on file    Attends meetings of clubs or organizations: Not on file  Relationship status: Not on file  Other Topics Concern  . Not on file  Social History Narrative  . Not on file   Past Surgical History:  Procedure Laterality Date  . ABDOMINAL EXPOSURE N/A 02/17/2018   Procedure: ABDOMINAL EXPOSURE;  Surgeon: Rosetta Posner, MD;  Location: [facility] OR;  Service: Vascular;  Laterality: N/A;  . ANKLE SURGERY     x 2  . ANTERIOR LAT LUMBAR FUSION N/A 02/17/2018   Procedure: Lumbar three-four Lumbar four-five  Anterolateral lumbar interbody fusion;  Surgeon: Kristeen Miss, MD;  Location: [place];  Service: Neurosurgery;  Laterality: N/A;  . ANTERIOR LUMBAR FUSION N/A 02/17/2018   Procedure: Lumbar five-Sacral one Anterior lumbar interbody fusion;  Surgeon: Kristeen Miss, MD;  Location: [facility];  Service: Neurosurgery;  Laterality: N/A;  . APPLICATION OF ROBOTIC  ASSISTANCE FOR SPINAL PROCEDURE N/A 02/17/2018   Procedure: APPLICATION OF ROBOTIC ASSISTANCE FOR SPINAL PROCEDURE;  Surgeon: Kristeen Miss, MD;  Location: [facility] [place];  Service: Neurosurgery;  Laterality: N/A;  . APPLICATION OF ROBOTIC ASSISTANCE FOR SPINAL PROCEDURE N/A 06/09/2018   Procedure: APPLICATION OF ROBOTIC ASSISTANCE FOR SPINAL PROCEDURE;  Surgeon: Kristeen Miss, MD;  Location: [place];  Service: Neurosurgery;  Laterality: N/A;  . BACK SURGERY     2017  discectomy  . CARPAL TUNNEL RELEASE Left 07/04/2014   Procedure: LEFT CARPAL TUNNEL RELEASE;  Surgeon: Ninetta Lights, MD;  Location: [place] [place];  Service: Orthopedics;  Laterality: Left;  . CARPAL TUNNEL RELEASE Right 08/08/2014   Procedure: RIGHT CARPAL TUNNEL RELEASE;  Surgeon: Ninetta Lights, MD;  Location: [place];  Service: Orthopedics;  Laterality: Right;  . DILATION AND CURETTAGE OF UTERUS    . DILATION AND EVACUATION  04/02/2011   Procedure: DILATATION AND EVACUATION (D&E);  Surgeon: [name] Lex, MD;  Location: [facility];  Service: Gynecology;  Laterality: N/A;  dvt left mid thigh  . DORSAL COMPARTMENT RELEASE Left 07/04/2014   Procedure: LEFT DEQUERVAINS;  Surgeon: Ninetta Lights, MD;  Location: [place];  Service: Orthopedics;  Laterality: Left;  . ENDOSCOPIC PLANTAR FASCIOTOMY Left 02/01/2002  . HARDWARE REMOVAL Right 06/09/2018   Procedure: Repositioning of Right Lumbar three Right lumbar  four pedicle screw with MAZOR;  Surgeon: Kristeen Miss, MD;  Location: [place];  Service: Neurosurgery;  Laterality: Right;  . HYSTEROSCOPY W/D&C  07/17/2010   with exc. endometrial polyps  . KNEE ARTHROSCOPY Right 03/07/2003; 01/14/2005; 08/31/2006  . KNEE ARTHROSCOPY Left 03/21/2003; 11/26/2004  . LUMBAR PERCUTANEOUS PEDICLE SCREW 3 LEVEL N/A 02/17/2018   Procedure: LUMBAR PERCUTANEOUS PEDICLE SCREW PLACEMENT LUMBAR THREE-SACRAL ONE;  Surgeon: Kristeen Miss, MD;  Location: [place];  Service: Neurosurgery;   Laterality: N/A;  . SHOULDER ARTHROSCOPY Right   . TOE SURGERY Left 01/10/2003   claw toe correction 2nd, 3rd, 4th toes  . TOTAL HIP ARTHROPLASTY Right 04/21/2016  . TOTAL HIP ARTHROPLASTY Right 04/21/2016   Procedure: TOTAL HIP ARTHROPLASTY ANTERIOR APPROACH;  Surgeon: Ninetta Lights, MD;  Location: [facility];  Service: Orthopedics;  Laterality: Right;  . TOTAL KNEE ARTHROPLASTY Left 01/21/2010  . TOTAL KNEE ARTHROPLASTY Right 08/26/2008   Past Medical History:  Diagnosis Date  . Arthritis    knees, back  . 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 Complication of anesthesia   . Depression   . History of pneumonia   . Hypertension   . Lumbar spondylosis   . PCOS (polycystic ovarian syndrome)   . Pneumonia  2016  . PONV (postoperative nausea and vomiting)    BP 132/87   Pulse 84   Temp 98.7 F (37.1 C)   Ht 5\' 11"  (1.803 m)   Wt 300 lb (136.1 kg)   SpO2 100%   BMI 41.84 kg/m   Opioid Risk Score:   Fall Risk Score:  `1  Depression screen PHQ 2/9  Depression screen [facility] 2/9 07/19/2018 07/13/2016 11/30/2015 08/17/2015 06/25/2015 06/01/2015 04/10/2015  Decreased Interest 1 2 0 0 0 0 0  Down, Depressed, Hopeless 1 3 0 0 0 0 0  PHQ - 2 Score 2 5 0 0 0 0 0  Some recent data might be hidden    Review of Systems  Constitutional: Negative.   HENT: Negative.   Eyes: Negative.   Respiratory: Negative.   Cardiovascular: Negative.   Gastrointestinal: Negative.   Endocrine: Negative.   Genitourinary: Negative.   Musculoskeletal: Positive for arthralgias and back pain.       Spasms   Skin: Negative.   Allergic/Immunologic: Negative.   Neurological: Positive for weakness and numbness.       Tingling   Hematological: Negative.   Psychiatric/Behavioral: Negative.       Objective:   Physical Exam Constitutional: No distress . Obese HENT: Normocephalic. Atraumatic.  Eyes: EOMI. No discharge Cardiovascular: No JVD. Respiratory: Normal effort  GI: Non-distended  Musc: No edema, no tenderness Pes planus b/l Gait: Right limp +Tinnels at right lateral femoral cutaneous nerve Neurological: She alert.  LLE: 5/5 proximal to distal RLE: HF 4-/5, KE 4/5, ADF 4+/5  Sensation diminished to light touch right lateral thigh.  Skin:warm and dry. Psychiatric:Flat    Assessment & Plan:  Right-handed female with history of hypertension, morbid obesity, bilateral TKA and chronic radicular back pain presents for follow up for lumbar spondylosis with radiculopathy s/p decompression L3-4 and L4-5 with anterior exposure of L5-S1 as well as percutaneous fixation L3 to the sacrum 02/17/2018 and resulting right L3 radiculopathy s/p hardware replacement and revision.     1. Decreased functional mobility secondary to lumbar spondylosis with radiculopathy.S/P decompression L3-4 and L4-5 with anterior exposure of L5-S1 as well as percutaneous fixation L3 to the sacrum 02/17/2018 s/p revision and hardware replacement              Cont therapies             Cont follow up with Neurosurg   2. Pain Management:              Unable to tolerate Gabapentin at high doses             Cont Lyrica 75 TID.             Cont Baclofen 30 mg BID             Cont Lidoderm patch             Cont TENS IT             Cont heating pad for limited time (only using occasionally)             Cont follow up [facility] - [facility]  Taking Percocet 2-3/day per pain management - [facility]   3. Mood             Cont follow up with Psychiatry   4. Morbid obesity.              States she knows what she needs to do  and has lost weight in the past, reminded again             Follow up with PCP  Encouraged excercise   5. Gait abnormality             No assistive device at present             Cont therapies  6. Pes Planus b/l  Orthotics not covered  Trial OTC insoles  7. Meralgia Paresthetica   See #2, #4  Complicated by #1  Patient would like to hold off on intervention at  this time

## 2019-05-09 ENCOUNTER — Ambulatory Visit: Payer: Medicare Other | Admitting: Physical Therapy

## 2019-05-14 ENCOUNTER — Ambulatory Visit: Payer: Medicare Other | Admitting: Physical Therapy

## 2019-05-16 ENCOUNTER — Other Ambulatory Visit: Payer: Self-pay

## 2019-05-16 ENCOUNTER — Ambulatory Visit: Payer: Medicare Other | Admitting: Physical Therapy

## 2019-05-16 DIAGNOSIS — G8929 Other chronic pain: Secondary | ICD-10-CM

## 2019-05-16 DIAGNOSIS — M6281 Muscle weakness (generalized): Secondary | ICD-10-CM | POA: Diagnosis not present

## 2019-05-16 DIAGNOSIS — M545 Low back pain, unspecified: Secondary | ICD-10-CM

## 2019-05-16 DIAGNOSIS — R2681 Unsteadiness on feet: Secondary | ICD-10-CM

## 2019-05-16 NOTE — Therapy (Signed)
[facility] [address] [place], [place], [address] Phone: [phone]   Fax:  [phone]  Physical Therapy Treatment  Patient Details  Name: Ashley [name] MRN: [id number] Date of Birth: 01-Oct-1975 Referring Provider (PT): Dr. Kristeen Miss    Encounter Date: 05/16/2019  PT End of Session - 05/16/19 1542    Visit Number  27    Number of Visits  36    Date for PT Re-Evaluation  06/11/19    Authorization Type  [employer] Medicare-needs 10th visit progress note    Authorization - Visit Number  17    Authorization - Number of Visits  22    PT Start Time  1402    PT Stop Time  1445    PT Time Calculation (min)  43 min    Activity Tolerance  Patient tolerated treatment well    Behavior During Therapy  [facility] for tasks assessed/performed       Past Medical History:  Diagnosis Date  . Arthritis    knees, back  . 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 Complication of anesthesia   . Depression   . History of pneumonia   . Hypertension   . Lumbar spondylosis   . PCOS (polycystic ovarian syndrome)   . Pneumonia    2016  . PONV (postoperative nausea and vomiting)     Past Surgical History:  Procedure Laterality Date  . ABDOMINAL EXPOSURE N/A 02/17/2018   Procedure: ABDOMINAL EXPOSURE;  Surgeon: Rosetta Posner, MD;  Location: [facility] OR;  Service: Vascular;  Laterality: N/A;  . ANKLE SURGERY     x 2  . ANTERIOR LAT LUMBAR FUSION N/A 02/17/2018   Procedure: Lumbar three-four Lumbar four-five  Anterolateral lumbar interbody fusion;  Surgeon: Kristeen Miss, MD;  Location: [place];  Service: Neurosurgery;  Laterality: N/A;  . ANTERIOR LUMBAR FUSION N/A 02/17/2018   Procedure: Lumbar five-Sacral one Anterior lumbar interbody fusion;  Surgeon: Kristeen Miss, MD;  Location: [place];  Service: Neurosurgery;  Laterality: N/A;  . APPLICATION OF ROBOTIC ASSISTANCE FOR SPINAL PROCEDURE N/A 02/17/2018   Procedure: APPLICATION OF ROBOTIC  ASSISTANCE FOR SPINAL PROCEDURE;  Surgeon: Kristeen Miss, MD;  Location: [place];  Service: Neurosurgery;  Laterality: N/A;  . APPLICATION OF ROBOTIC ASSISTANCE FOR SPINAL PROCEDURE N/A 06/09/2018   Procedure: APPLICATION OF ROBOTIC ASSISTANCE FOR SPINAL PROCEDURE;  Surgeon: Kristeen Miss, MD;  Location: [facility];  Service: Neurosurgery;  Laterality: N/A;  . BACK SURGERY     2017  discectomy  . CARPAL TUNNEL RELEASE Left 07/04/2014   Procedure: LEFT CARPAL TUNNEL RELEASE;  Surgeon: Ninetta Lights, MD;  Location: [place];  Service: Orthopedics;  Laterality: Left;  . CARPAL TUNNEL RELEASE Right 08/08/2014   Procedure: RIGHT CARPAL TUNNEL RELEASE;  Surgeon: Ninetta Lights, MD;  Location: [place];  Service: Orthopedics;  Laterality: Right;  . DILATION AND CURETTAGE OF UTERUS    . DILATION AND EVACUATION  04/02/2011   Procedure: DILATATION AND EVACUATION (D&E);  Surgeon: Luz Lex, MD;  Location: [facility];  Service: Gynecology;  Laterality: N/A;  dvt left mid thigh  . DORSAL COMPARTMENT RELEASE Left 07/04/2014   Procedure: LEFT DEQUERVAINS;  Surgeon: Ninetta Lights, MD;  Location: [place];  Service: Orthopedics;  Laterality: Left;  . ENDOSCOPIC PLANTAR FASCIOTOMY Left 02/01/2002  . HARDWARE REMOVAL Right 06/09/2018   Procedure: Repositioning of Right Lumbar three Right lumbar  four pedicle screw  with YEBXI;  Surgeon: Kristeen Miss, MD;  Location: [place];  Service: Neurosurgery;  Laterality: Right;  . HYSTEROSCOPY W/D&C  07/17/2010   with exc. endometrial polyps  . KNEE ARTHROSCOPY Right 03/07/2003; 01/14/2005; 08/31/2006  . KNEE ARTHROSCOPY Left 03/21/2003; 11/26/2004  . LUMBAR PERCUTANEOUS PEDICLE SCREW 3 LEVEL N/A 02/17/2018   Procedure: LUMBAR PERCUTANEOUS PEDICLE SCREW PLACEMENT LUMBAR THREE-SACRAL ONE;  Surgeon: Kristeen Miss, MD;  Location: [facility];  Service: Neurosurgery;  Laterality: N/A;  . SHOULDER ARTHROSCOPY Right   . TOE SURGERY Left 01/10/2003    claw toe correction 2nd, 3rd, 4th toes  . TOTAL HIP ARTHROPLASTY Right 04/21/2016  . TOTAL HIP ARTHROPLASTY Right 04/21/2016   Procedure: TOTAL HIP ARTHROPLASTY ANTERIOR APPROACH;  Surgeon: Ninetta Lights, MD;  Location: [place];  Service: Orthopedics;  Laterality: Right;  . TOTAL KNEE ARTHROPLASTY Left 01/21/2010  . TOTAL KNEE ARTHROPLASTY Right 08/26/2008    There were no vitals filed for this visit.  Subjective Assessment - 05/16/19 1541    Subjective  My Rt hip is really bothering me today 7/10 pain.    Pertinent History  Past Surgical Hx: B TKA (2009, 2011) and R THA 2017. PMH includes; OA, Morbid Obesity, Depression, HTN, Lumbar Spondylosis, Polycystic Ovarian Syndrome, Pneumonia, and PONV    Limitations  Standing;Walking;Lifting;Sitting    How long can you sit comfortably?  Sitting is uncomfortable. No amount of time specified.     How long can you stand comfortably?  about 5-10 mins    How long can you walk comfortably?  Short period of time with no time period specified    Patient Stated Goals  Reports goal of being more mobile with increased flexibility. Wants to "atleast put lotion on her feet."    Pain Onset  More than a month ago    Pain Onset  More than a month ago         [facility] Adult PT Treatment/Exercise - 05/16/19 0001      Lumbar Exercises: Stretches   Active Hamstring Stretch  Right;Left;2 reps;30 seconds    Active Hamstring Stretch Limitations  standing foot on 2nd step    Other Lumbar Stretch Exercise  hip flexor stretch standing at step 2X30 sec bilat    Other Lumbar Stretch Exercise  gastroc stretch heels off step 30 sec X 2 bilat      Lumbar Exercises: Aerobic   Tread Mill  6 min for .25 miles 2-2.5 mph      Lumbar Exercises: Machines for Strengthening   Leg Press  60 lbs 2X10      Knee/Hip Exercises: Standing   Heel Raises Limitations  heel toe raises X 15 ea    Lateral Step Up  Both;Hand Hold: 2;Step Height: 6";15 reps    Forward Step Up  Right;Left;Hand  Hold: 2;Step Height: 6";15 reps    SLS  Rt leg 3 X 15 sec, Lt leg 2 reps      Knee/Hip Exercises: Supine   Straight Leg Raises Limitations  unable to perform due to pain and weakness so modified to bent leg marches X 10 then put feet on [place] for DKTC/hip flexion activation X 10        PT Short Term Goals - 05/01/19 0816      PT SHORT TERM GOAL #1   Title  Pt will initiate HEP in order to indicate improved functional mobility and decreased fall risk.  (STG DUE 11/13)    Baseline  met 08/07/18  Time  4    Period  Weeks    Status  Achieved      PT SHORT TERM GOAL #2   Title  Pt will improve gait speed to >/= 2.57 ft/sec with LRAD in order to indicate decreased fall risk     Baseline  10/14: 2.27 ft/ sec with no AD to 3.16 ft/sec without AD on 08/07/18    Time  4    Period  Weeks    Status  Achieved      PT SHORT TERM GOAL #3   Title  Pt will perform 5TSS </=17.57 secs with single UE support only in order to indicate decreased fall risk and improved functional strength.      Baseline  10/14: 22.74 from standard height chair with B UE assist to 16.38 secs without UE support.     Time  4    Period  Weeks    Status  Achieved      PT SHORT TERM GOAL #4   Title  Pt will improve BERG balance score to >/=34/56 in order to indicate decreased fall risk.     Baseline  10/14: 30/56 indicative of high fall risk potential to 51/56 on 08/07/18    Time  4    Period  Weeks    Status  Achieved      PT SHORT TERM GOAL #5   Title  Pt will negotiate up/down 8 steps with bilateral rails with supervision in order to indicate improved LE strength    Baseline  met with single rail reciprocal pattern 08/07/18    Time  4    Period  Weeks    Status  Achieved      PT SHORT TERM GOAL #6   Title  Pt will ambulate 300' on uneven surfaces, ramps, curbs, inclines, demonstrating ability to vary gait speed when cued using LRAD and therapist providing supervision to improve safety with community distances     met 08/07/18   Time  4    Period  Weeks    Status  Achieved      PT SHORT TERM GOAL #7   Title  Pt will participate in 6 min walk test to assess baseline endurance with functional mobility     Time  4    Period  Weeks    Status  Achieved        PT Long Term Goals - 05/01/19 0816      PT LONG TERM GOAL #1   Title  Pt will be independent with final HEP in order to indicate improved functional mobilty and decreased fall risk.   (UPDATED LTG DUE 12/06/18)    Baseline  updated program 8/10    Time  4    Period  Weeks    Status  On-going      PT LONG TERM GOAL #2   Title  Pt will improve gait speed to >/=4.10 ft/sec w/ LRAD in order to indicate decreased fall risk and safe community ambulator.     Baseline  10/24/18: 3.35 ft/sec no AD     Time  4    Period  Weeks    Status  On-going      PT LONG TERM GOAL #3   Title  Pt will perform 5TSS in </=13 secs without UE support in order to indicate decreased fall risk and improved functional strength.     Baseline  16 sec 8/10    Status  On-going  PT LONG TERM GOAL #4   Title  Pt will improve FGA to >/=25/30 in order to indicate decreased fall risk.     Baseline  04/30/19: 24/30 scored today    Time  4    Period  Weeks    Status  On-going      PT LONG TERM GOAL #5   Title  Pt will negotiate up/down 8 steps without rail in reciprocal pattern while carrying item (ie grocery bags) in order to indicate improved safety with home entry/exit.     Baseline  10/26/18: continues to need intermittent touch to rails with reciprocal pattern with supervision    Time  4    Period  Weeks    Status  On-going      PT LONG TERM GOAL #6   Title  Pt will ambulate x 500' outdoors/indoors without AD with less antaglic gait pattern in order to indicate improved fluidity of gait and decreased pain.     Baseline  still with slight antalgic gait and difficulty advancing Rt leg at times    Status  On-going      PT LONG TERM GOAL #7   Title  Pt will  improve 6 min walk test to1589' indicating improvement with functional endurance    10/25: Patient performs 675 with rollator to 818' 08/10/18   Baseline  did not test today due to time contraints    Time  4    Period  Weeks    Status  On-going            Plan - 05/16/19 1543    Clinical Impression Statement  Even though she had pain she had good overall tolerance to exercise today, see objecitve section for details. She continues to be limited with Rt hip flexion weakness and pain. She is requesting aquatic PT and PT agrees this would be a good intervention for her thus will contact PT who does aquatics about setting her up.    Rehab Potential  Good    Clinical Impairments Affecting Rehab Potential  Pt is motivated to improve. Strength gains may be limited 2/2 to nerve regeneration process     PT Frequency  2x / week    PT Duration  6 weeks    PT Treatment/Interventions  ADLs/Self Care Home Management;Aquatic Therapy;DME Instruction;Gait training;Stair training;Functional mobility training;Therapeutic activities;Therapeutic exercise;Balance training;Neuromuscular re-education;Patient/family education;Electrical Stimulation;Passive range of motion;Energy conservation;Manual techniques;Dry needling;Vestibular    PT Next Visit Plan  continue with high level balance/agility drills/plyometric drills; hip strengthening    PT Home Exercise Plan   [id number]     Consulted and Agree with Plan of Care  Patient       Patient will benefit from skilled therapeutic intervention in order to improve the following deficits and impairments:  Abnormal gait, Decreased activity tolerance, Decreased balance, Decreased coordination, Decreased endurance, Decreased mobility, Decreased range of motion, Decreased strength, Impaired perceived functional ability, Impaired flexibility, Impaired sensation, Pain, Decreased knowledge of use of DME, Decreased knowledge of precautions, Obesity  Visit Diagnosis: Muscle  weakness (generalized)  Unsteadiness on feet  Chronic midline low back pain without sciatica     Problem List Patient Active Problem List   Diagnosis Date Noted  . Flat foot 05/07/2019  . Abnormality of gait 02/22/2019  . Lumbosacral radiculopathy at L4 06/09/2018  . Generalized anxiety disorder   . Pain   . Fall   . Labile blood pressure   . Neuropathic pain   . Therapeutic opioid induced constipation   .  Urinary retention   . Lumbar radiculopathy 02/21/2018  . PCOS (polycystic ovarian syndrome)   . Morbid obesity ([place])   . Depression   . Generalized OA   . Postoperative pain   . Acute blood loss anemia   . Status post surgery 02/17/2018  . Lumbar radiculopathy, chronic 02/17/2018  . Primary localized osteoarthritis of right hip 04/21/2016  . Sinusitis 06/25/2015  . Cough 06/25/2015  . Essential hypertension 04/03/2015  . Dehydration 04/03/2015  . CAP (community acquired pneumonia) 03/31/2015  . Right lower lobe pneumonia ([place]) 03/31/2015  . Fever 03/31/2015  . Pregnancy 04/29/2014  . Miscarriage   . Incomplete miscarriage 04/02/2011    Class: Acute  . 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 [place], [place], [address] Phone: [phone]   Fax:  [phone]  Name: Ashley [name] MRN: [id number] Date of Birth: 06/06/1976

## 2019-05-21 ENCOUNTER — Ambulatory Visit: Payer: Medicare Other | Admitting: Physical Therapy

## 2019-05-23 ENCOUNTER — Other Ambulatory Visit: Payer: Self-pay

## 2019-05-23 ENCOUNTER — Ambulatory Visit: Payer: Medicare Other | Attending: Neurological Surgery | Admitting: Physical Therapy

## 2019-05-23 ENCOUNTER — Encounter: Payer: Self-pay | Admitting: Physical Therapy

## 2019-05-23 DIAGNOSIS — R2681 Unsteadiness on feet: Secondary | ICD-10-CM | POA: Diagnosis present

## 2019-05-23 DIAGNOSIS — M6281 Muscle weakness (generalized): Secondary | ICD-10-CM

## 2019-05-23 DIAGNOSIS — G8929 Other chronic pain: Secondary | ICD-10-CM | POA: Diagnosis present

## 2019-05-23 DIAGNOSIS — M545 Low back pain: Secondary | ICD-10-CM | POA: Diagnosis present

## 2019-05-24 NOTE — Therapy (Signed)
[facility] [address] [place], [place], [address] Phone: [phone]   Fax:  [phone]  Physical Therapy Treatment  Patient Details  Name: Ashley [name] MRN: [id number] Date of Birth: [birth year] Referring Provider (PT): Dr. Kristeen Miss    Encounter Date: 05/23/2019  PT End of Session - 05/23/19 0938    Visit Number  28    Number of Visits  36    Date for PT Re-Evaluation  06/11/19    Authorization Type  [employer] Medicare-needs 10th visit progress note    Authorization - Visit Number  18    Authorization - Number of Visits  22    PT Start Time  [phone]    PT Stop Time  1015    PT Time Calculation (min)  41 min    Activity Tolerance  Patient tolerated treatment well;Patient limited by pain    Behavior During Therapy  [facility] for tasks assessed/performed       Past Medical History:  Diagnosis Date  . Arthritis    knees, back  . 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 Complication of anesthesia   . Depression   . History of pneumonia   . Hypertension   . Lumbar spondylosis   . PCOS (polycystic ovarian syndrome)   . Pneumonia    2016  . PONV (postoperative nausea and vomiting)     Past Surgical History:  Procedure Laterality Date  . ABDOMINAL EXPOSURE N/A 02/17/2018   Procedure: ABDOMINAL EXPOSURE;  Surgeon: Rosetta Posner, MD;  Location: [facility] OR;  Service: Vascular;  Laterality: N/A;  . ANKLE SURGERY     x 2  . ANTERIOR LAT LUMBAR FUSION N/A 02/17/2018   Procedure: Lumbar three-four Lumbar four-five  Anterolateral lumbar interbody fusion;  Surgeon: Kristeen Miss, MD;  Location: [facility];  Service: Neurosurgery;  Laterality: N/A;  . ANTERIOR LUMBAR FUSION N/A 02/17/2018   Procedure: Lumbar five-Sacral one Anterior lumbar interbody fusion;  Surgeon: Kristeen Miss, MD;  Location: [facility];  Service: Neurosurgery;  Laterality: N/A;  . APPLICATION OF ROBOTIC ASSISTANCE FOR SPINAL PROCEDURE N/A 02/17/2018   Procedure:  APPLICATION OF ROBOTIC ASSISTANCE FOR SPINAL PROCEDURE;  Surgeon: Kristeen Miss, MD;  Location: [facility];  Service: Neurosurgery;  Laterality: N/A;  . APPLICATION OF ROBOTIC ASSISTANCE FOR SPINAL PROCEDURE N/A 06/09/2018   Procedure: APPLICATION OF ROBOTIC ASSISTANCE FOR SPINAL PROCEDURE;  Surgeon: Kristeen Miss, MD;  Location: [facility];  Service: Neurosurgery;  Laterality: N/A;  . BACK SURGERY     2017  discectomy  . CARPAL TUNNEL RELEASE Left 07/04/2014   Procedure: LEFT CARPAL TUNNEL RELEASE;  Surgeon: Ninetta Lights, MD;  Location: [facility];  Service: Orthopedics;  Laterality: Left;  . CARPAL TUNNEL RELEASE Right 08/08/2014   Procedure: RIGHT CARPAL TUNNEL RELEASE;  Surgeon: Ninetta Lights, MD;  Location: [facility];  Service: Orthopedics;  Laterality: Right;  . DILATION AND CURETTAGE OF UTERUS    . DILATION AND EVACUATION  04/02/2011   Procedure: DILATATION AND EVACUATION (D&E);  Surgeon: Luz Lex, MD;  Location: [facility];  Service: Gynecology;  Laterality: N/A;  dvt left mid thigh  . DORSAL COMPARTMENT RELEASE Left 07/04/2014   Procedure: LEFT DEQUERVAINS;  Surgeon: Ninetta Lights, MD;  Location: [facility];  Service: Orthopedics;  Laterality: Left;  . ENDOSCOPIC PLANTAR FASCIOTOMY Left 02/01/2002  . HARDWARE REMOVAL Right 06/09/2018   Procedure: Repositioning of Right Lumbar three Right lumbar  four pedicle screw with MAZOR;  Surgeon: Kristeen Miss, MD;  Location: [facility];  Service: Neurosurgery;  Laterality: Right;  . HYSTEROSCOPY W/D&C  07/17/2010   with exc. endometrial polyps  . KNEE ARTHROSCOPY Right 03/07/2003; 01/14/2005; 08/31/2006  . KNEE ARTHROSCOPY Left 03/21/2003; 11/26/2004  . LUMBAR PERCUTANEOUS PEDICLE SCREW 3 LEVEL N/A 02/17/2018   Procedure: LUMBAR PERCUTANEOUS PEDICLE SCREW PLACEMENT LUMBAR THREE-SACRAL ONE;  Surgeon: Kristeen Miss, MD;  Location: [facility];  Service: Neurosurgery;  Laterality: N/A;  . SHOULDER ARTHROSCOPY Right   . TOE  SURGERY Left 01/10/2003   claw toe correction 2nd, 3rd, 4th toes  . TOTAL HIP ARTHROPLASTY Right 04/21/2016  . TOTAL HIP ARTHROPLASTY Right 04/21/2016   Procedure: TOTAL HIP ARTHROPLASTY ANTERIOR APPROACH;  Surgeon: Ninetta Lights, MD;  Location: [facility];  Service: Orthopedics;  Laterality: Right;  . TOTAL KNEE ARTHROPLASTY Left 01/21/2010  . TOTAL KNEE ARTHROPLASTY Right 08/26/2008    There were no vitals filed for this visit.  Subjective Assessment - 05/23/19 0935    Subjective  Reporting right hip pain, worse the other day with the rainy weather. rating 4-5/10 at this time.    Pertinent History  Past Surgical Hx: B TKA (2009, 2011) and R THA 2017. PMH includes; OA, Morbid Obesity, Depression, HTN, Lumbar Spondylosis, Polycystic Ovarian Syndrome, Pneumonia, and PONV    Limitations  Standing;Walking;Lifting;Sitting    How long can you sit comfortably?  Sitting is uncomfortable. No amount of time specified.     How long can you stand comfortably?  about 5-10 mins    How long can you walk comfortably?  Short period of time with no time period specified    Patient Stated Goals  Reports goal of being more mobile with increased flexibility. Wants to "atleast put lotion on her feet."    Currently in Pain?  Yes    Pain Score  5     Pain Location  Hip    Pain Orientation  Right    Pain Descriptors / Indicators  Aching;Tightness    Pain Type  Chronic pain    Pain Radiating Towards  numbess and tingling down right leg    Pain Onset  More than a month ago    Pain Frequency  Constant    Aggravating Factors   weather, increased activity    Pain Relieving Factors  rest, stretching    Pain Score  5    Pain Location  Back    Pain Orientation  Lower    Pain Descriptors / Indicators  Aching;Sore    Pain Type  Chronic pain    Pain Onset  More than a month ago    Pain Frequency  Constant    Aggravating Factors   increased activity, some exercises    Pain Relieving Factors  stretches             [facility] Adult PT Treatment/Exercise - 05/23/19 0939      Lumbar Exercises: Stretches   Active Hamstring Stretch  Right;3 reps;30 seconds;Limitations    Active Hamstring Stretch Limitations  standing foot on 2nd step    Passive Hamstring Stretch  Right;2 reps;60 seconds;Limitations    Passive Hamstring Stretch Limitations  in hooklying on mat table    Other Lumbar Stretch Exercise  gastroc stretch heels off step 30 sec X 2 bilat      Lumbar Exercises: Supine   Bridge  10 reps;5 seconds;Limitations    Bridge Limitations  cue for lifting hips as high as possible  Other Supine Lumbar Exercises  with red pball under feet- hamstring curls for 10 reps with cues on full movements; with ball under calves-bridging x 10 reps with arms at sides on mat table.       Knee/Hip Exercises: Stretches   Active Hamstring Stretch  Both;3 reps;30 seconds;Limitations    Active Hamstring Stretch Limitations  end of session had pt perform these while seated at edge of mat table.       Knee/Hip Exercises: Aerobic   Other Aerobic  Scifit LE/UE's  for 8 minutes level 6.5 x 8 minutes for strengthening/activity tolerance.       Manual Therapy   Manual Therapy  Other (comment)    Other Manual Therapy  in hooklying: right hip distraction via right LE for 45 sec holds for 5 reps with decreased pain reported. in left sidelying- worked on PNF with hip elevation/depression for 10 reps, then hip distraction for 45 sec holds for 3 reps. pt reported decreased pain with hooklying distraction > sidelying distraction.            PT Short Term Goals - 05/01/19 0816      PT SHORT TERM GOAL #1   Title  Pt will initiate HEP in order to indicate improved functional mobility and decreased fall risk.  (STG DUE 11/13)    Baseline  met 08/07/18    Time  4    Period  Weeks    Status  Achieved      PT SHORT TERM GOAL #2   Title  Pt will improve gait speed to >/= 2.57 ft/sec with LRAD in order to indicate decreased  fall risk     Baseline  10/14: 2.27 ft/ sec with no AD to 3.16 ft/sec without AD on 08/07/18    Time  4    Period  Weeks    Status  Achieved      PT SHORT TERM GOAL #3   Title  Pt will perform 5TSS </=17.57 secs with single UE support only in order to indicate decreased fall risk and improved functional strength.      Baseline  10/14: 22.74 from standard height chair with B UE assist to 16.38 secs without UE support.     Time  4    Period  Weeks    Status  Achieved      PT SHORT TERM GOAL #4   Title  Pt will improve BERG balance score to >/=34/56 in order to indicate decreased fall risk.     Baseline  10/14: 30/56 indicative of high fall risk potential to 51/56 on 08/07/18    Time  4    Period  Weeks    Status  Achieved      PT SHORT TERM GOAL #5   Title  Pt will negotiate up/down 8 steps with bilateral rails with supervision in order to indicate improved LE strength    Baseline  met with single rail reciprocal pattern 08/07/18    Time  4    Period  Weeks    Status  Achieved      PT SHORT TERM GOAL #6   Title  Pt will ambulate 300' on uneven surfaces, ramps, curbs, inclines, demonstrating ability to vary gait speed when cued using LRAD and therapist providing supervision to improve safety with community distances    met 08/07/18   Time  4    Period  Weeks    Status  Achieved      PT SHORT  TERM GOAL #7   Title  Pt will participate in 6 min walk test to assess baseline endurance with functional mobility     Time  4    Period  Weeks    Status  Achieved        PT Long Term Goals - 05/24/19 1540      PT LONG TERM GOAL #1   Title  Pt will be independent with final HEP in order to indicate improved functional mobilty and decreased fall risk.   (UPDATED LTG DUE 10/22/52 per recent recert)    Baseline  updated program 8/10    Time  4    Period  Weeks    Status  On-going      PT LONG TERM GOAL #2   Title  Pt will improve gait speed to >/=4.10 ft/sec w/ LRAD in order to  indicate decreased fall risk and safe community ambulator.     Baseline  10/24/18: 3.35 ft/sec no AD     Time  4    Period  Weeks    Status  On-going      PT LONG TERM GOAL #3   Title  Pt will perform 5TSS in </=13 secs without UE support in order to indicate decreased fall risk and improved functional strength.     Baseline  16 sec 8/10    Status  On-going      PT LONG TERM GOAL #4   Title  Pt will improve FGA to >/=25/30 in order to indicate decreased fall risk.     Baseline  04/30/19: 24/30 scored today    Time  4    Period  Weeks    Status  On-going      PT LONG TERM GOAL #5   Title  Pt will negotiate up/down 8 steps without rail in reciprocal pattern while carrying item (ie grocery bags) in order to indicate improved safety with home entry/exit.     Baseline  10/26/18: continues to need intermittent touch to rails with reciprocal pattern with supervision    Time  4    Period  Weeks    Status  On-going      PT LONG TERM GOAL #6   Title  Pt will ambulate x 500' outdoors/indoors without AD with less antaglic gait pattern in order to indicate improved fluidity of gait and decreased pain.     Baseline  still with slight antalgic gait and difficulty advancing Rt leg at times    Status  On-going      PT LONG TERM GOAL #7   Title  Pt will improve 6 min walk test to1589' indicating improvement with functional endurance    10/25: Patient performs 675 with rollator to 818' 08/10/18   Baseline  did not test today due to time contraints    Time  4    Period  Weeks    Status  On-going            Plan - 05/23/19 0939    Clinical Impression Statement  Today's skilled session continued to address LE strengthening and pain reduction with stretching. No increased pain reported. The pt shoud benefit from continued PT to progress toward unmet goals.    Rehab Potential  Good    Clinical Impairments Affecting Rehab Potential  Pt is motivated to improve. Strength gains may be limited 2/2 to  nerve regeneration process     PT Frequency  2x / week    PT Duration  6 weeks    PT Treatment/Interventions  ADLs/Self Care Home Management;Aquatic Therapy;DME Instruction;Gait training;Stair training;Functional mobility training;Therapeutic activities;Therapeutic exercise;Balance training;Neuromuscular re-education;Patient/family education;Electrical Stimulation;Passive range of motion;Energy conservation;Manual techniques;Dry needling;Vestibular    PT Next Visit Plan  continue with high level balance/agility drills/plyometric drills; hip strengthening    PT Home Exercise Plan   [id number]     Consulted and Agree with Plan of Care  Patient       Patient will benefit from skilled therapeutic intervention in order to improve the following deficits and impairments:  Abnormal gait, Decreased activity tolerance, Decreased balance, Decreased coordination, Decreased endurance, Decreased mobility, Decreased range of motion, Decreased strength, Impaired perceived functional ability, Impaired flexibility, Impaired sensation, Pain, Decreased knowledge of use of DME, Decreased knowledge of precautions, Obesity  Visit Diagnosis: Muscle weakness (generalized)  Unsteadiness on feet  Chronic midline low back pain without sciatica     Problem List Patient Active Problem List   Diagnosis Date Noted  . Flat foot 05/07/2019  . Abnormality of gait 02/22/2019  . Lumbosacral radiculopathy at L4 06/09/2018  . Generalized anxiety disorder   . Pain   . Fall   . Labile blood pressure   . Neuropathic pain   . Therapeutic opioid induced constipation   . Urinary retention   . Lumbar radiculopathy 02/21/2018  . PCOS (polycystic ovarian syndrome)   . Morbid obesity ([facility])   . Depression   . Generalized OA   . Postoperative pain   . Acute blood loss anemia   . Status post surgery 02/17/2018  . Lumbar radiculopathy, chronic 02/17/2018  . Primary localized osteoarthritis of right hip 04/21/2016  . Sinusitis  06/25/2015  . Cough 06/25/2015  . Essential hypertension 04/03/2015  . Dehydration 04/03/2015  . CAP (community acquired pneumonia) 03/31/2015  . Right lower lobe pneumonia ([facility]) 03/31/2015  . Fever 03/31/2015  . Pregnancy 04/29/2014  . Miscarriage   . Incomplete miscarriage 04/02/2011    Class: Acute  . Deep vein thrombosis (DVT) ([facility]) 04/02/2011    Class: Chronic    Willow Ora, PTA, [facility] [address] [place], [place] [address] [phone] 05/24/19, 3:41 PM   Name: JONNAE FONSECA MRN: [id number] Date of Birth: 1976-07-14

## 2019-05-25 ENCOUNTER — Other Ambulatory Visit: Payer: Self-pay

## 2019-05-25 ENCOUNTER — Encounter: Payer: Self-pay | Admitting: Physical Therapy

## 2019-05-25 ENCOUNTER — Ambulatory Visit: Payer: Medicare Other | Admitting: Physical Therapy

## 2019-05-25 DIAGNOSIS — M6281 Muscle weakness (generalized): Secondary | ICD-10-CM | POA: Diagnosis not present

## 2019-05-25 DIAGNOSIS — R2681 Unsteadiness on feet: Secondary | ICD-10-CM

## 2019-05-25 DIAGNOSIS — G8929 Other chronic pain: Secondary | ICD-10-CM

## 2019-05-25 DIAGNOSIS — M545 Low back pain, unspecified: Secondary | ICD-10-CM

## 2019-05-26 NOTE — Therapy (Signed)
[facility] [address] [place], [place], [address] Phone: [phone]   Fax:  [phone]  Physical Therapy Treatment  Patient Details  Name: Ashley [name] MRN: [id number] Date of Birth: 1976/03/04 Referring Provider (PT): Dr. Kristeen Miss    Encounter Date: 05/25/2019  PT End of Session - 05/25/19 1238    Visit Number  29    Number of Visits  36    Date for PT Re-Evaluation  06/11/19    Authorization Type  [employer] Medicare-needs 10th visit progress note    Authorization - Visit Number  19    Authorization - Number of Visits  22    PT Start Time  1740    PT Stop Time  1313    PT Time Calculation (min)  38 min    Activity Tolerance  Patient tolerated treatment well;Patient limited by pain    Behavior During Therapy  [facility] for tasks assessed/performed       Past Medical History:  Diagnosis Date  . Arthritis    knees, back  . 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 Complication of anesthesia   . Depression   . History of pneumonia   . Hypertension   . Lumbar spondylosis   . PCOS (polycystic ovarian syndrome)   . Pneumonia    2016  . PONV (postoperative nausea and vomiting)     Past Surgical History:  Procedure Laterality Date  . ABDOMINAL EXPOSURE N/A 02/17/2018   Procedure: ABDOMINAL EXPOSURE;  Surgeon: Rosetta Posner, MD;  Location: [facility] OR;  Service: Vascular;  Laterality: N/A;  . ANKLE SURGERY     x 2  . ANTERIOR LAT LUMBAR FUSION N/A 02/17/2018   Procedure: Lumbar three-four Lumbar four-five  Anterolateral lumbar interbody fusion;  Surgeon: Kristeen Miss, MD;  Location: [facility];  Service: Neurosurgery;  Laterality: N/A;  . ANTERIOR LUMBAR FUSION N/A 02/17/2018   Procedure: Lumbar five-Sacral one Anterior lumbar interbody fusion;  Surgeon: Kristeen Miss, MD;  Location: [place];  Service: Neurosurgery;  Laterality: N/A;  . APPLICATION OF ROBOTIC ASSISTANCE FOR SPINAL PROCEDURE N/A 02/17/2018   Procedure:  APPLICATION OF ROBOTIC ASSISTANCE FOR SPINAL PROCEDURE;  Surgeon: Kristeen Miss, MD;  Location: [place];  Service: Neurosurgery;  Laterality: N/A;  . APPLICATION OF ROBOTIC ASSISTANCE FOR SPINAL PROCEDURE N/A 06/09/2018   Procedure: APPLICATION OF ROBOTIC ASSISTANCE FOR SPINAL PROCEDURE;  Surgeon: Kristeen Miss, MD;  Location: [place];  Service: Neurosurgery;  Laterality: N/A;  . BACK SURGERY     2017  discectomy  . CARPAL TUNNEL RELEASE Left 07/04/2014   Procedure: LEFT CARPAL TUNNEL RELEASE;  Surgeon: Ninetta Lights, MD;  Location: [facility];  Service: Orthopedics;  Laterality: Left;  . CARPAL TUNNEL RELEASE Right 08/08/2014   Procedure: RIGHT CARPAL TUNNEL RELEASE;  Surgeon: Ninetta Lights, MD;  Location: [facility];  Service: Orthopedics;  Laterality: Right;  . DILATION AND CURETTAGE OF UTERUS    . DILATION AND EVACUATION  04/02/2011   Procedure: DILATATION AND EVACUATION (D&E);  Surgeon: Luz Lex, MD;  Location: [facility];  Service: Gynecology;  Laterality: N/A;  dvt left mid thigh  . DORSAL COMPARTMENT RELEASE Left 07/04/2014   Procedure: LEFT DEQUERVAINS;  Surgeon: Ninetta Lights, MD;  Location: [facility];  Service: Orthopedics;  Laterality: Left;  . ENDOSCOPIC PLANTAR FASCIOTOMY Left 02/01/2002  . HARDWARE REMOVAL Right 06/09/2018   Procedure: Repositioning of Right Lumbar three Right lumbar  four pedicle screw with MAZOR;  Surgeon: Kristeen Miss, MD;  Location: [facility];  Service: Neurosurgery;  Laterality: Right;  . HYSTEROSCOPY W/D&C  07/17/2010   with exc. endometrial polyps  . KNEE ARTHROSCOPY Right 03/07/2003; 01/14/2005; 08/31/2006  . KNEE ARTHROSCOPY Left 03/21/2003; 11/26/2004  . LUMBAR PERCUTANEOUS PEDICLE SCREW 3 LEVEL N/A 02/17/2018   Procedure: LUMBAR PERCUTANEOUS PEDICLE SCREW PLACEMENT LUMBAR THREE-SACRAL ONE;  Surgeon: Kristeen Miss, MD;  Location: [facility];  Service: Neurosurgery;  Laterality: N/A;  . SHOULDER ARTHROSCOPY Right   . TOE  SURGERY Left 01/10/2003   claw toe correction 2nd, 3rd, 4th toes  . TOTAL HIP ARTHROPLASTY Right 04/21/2016  . TOTAL HIP ARTHROPLASTY Right 04/21/2016   Procedure: TOTAL HIP ARTHROPLASTY ANTERIOR APPROACH;  Surgeon: Ninetta Lights, MD;  Location: [place];  Service: Orthopedics;  Laterality: Right;  . TOTAL KNEE ARTHROPLASTY Left 01/21/2010  . TOTAL KNEE ARTHROPLASTY Right 08/26/2008    There were no vitals filed for this visit.  Subjective Assessment - 05/25/19 1236    Subjective  No new complaints. No falls. Pain is "okay" today.    Pertinent History  Past Surgical Hx: B TKA (2009, 2011) and R THA 2017. PMH includes; OA, Morbid Obesity, Depression, HTN, Lumbar Spondylosis, Polycystic Ovarian Syndrome, Pneumonia, and PONV    Limitations  Standing;Walking;Lifting;Sitting    How long can you sit comfortably?  Sitting is uncomfortable. No amount of time specified.     How long can you stand comfortably?  about 5-10 mins    How long can you walk comfortably?  Short period of time with no time period specified    Patient Stated Goals  Reports goal of being more mobile with increased flexibility. Wants to "atleast put lotion on her feet."    Currently in Pain?  Yes    Pain Score  3     Pain Location  Hip    Pain Orientation  Right    Pain Descriptors / Indicators  Aching;Tightness    Pain Type  Chronic pain    Pain Onset  More than a month ago    Pain Frequency  Constant    Aggravating Factors   weather, increased activity    Pain Relieving Factors  rest, stretching    Pain Score  3    Pain Location  Back    Pain Orientation  Lower    Pain Descriptors / Indicators  Aching;Sore    Pain Type  Chronic pain    Pain Onset  More than a month ago    Pain Frequency  Constant    Aggravating Factors   increased activity, some exercises            [facility] Adult PT Treatment/Exercise - 05/25/19 1246      Lumbar Exercises: Aerobic   Tread Mill  2.5 mph for 6 minutes with bil UE support. cues on  posture with foot scuffing noted twice.       Knee/Hip Exercises: Stretches   Active Hamstring Stretch  Right;3 reps;30 seconds;Limitations    Active Hamstring Stretch Limitations  end of session had pt perform while seated at edge of mat table.       Knee/Hip Exercises: Machines for Strengthening   Cybex Leg Press  80 lbs bil 2X10 then rest break and dropped to 50 lbs for Rt leg only X 20 reps. cues for slow, controlled movements.       Knee/Hip Exercises: Standing   Lateral Step Up  Right;1 set;10 reps;Hand Hold:  2;Step Height: 4";Limitations    Lateral Step Up Limitations  cues on form and technique    Forward Step Up  Right;1 set;10 reps;Hand Hold: 1;Step Height: 4";Limitations    Forward Step Up Limitations  cues on form and technique    Step Down  Right;1 set;10 reps;Hand Hold: 1;Step Height: 4"    Step Down Limitations  fwd and lateral with heel taps on left      Knee/Hip Exercises: Supine   Bridges  AROM;Strengthening;Both;2 sets;10 reps;Limitations    Bridges Limitations  with pt squeezing yoga block between knees with each rep      Manual Therapy   Other Manual Therapy  in hooklying: right hip gentle manual distraction for pain relief. held for 30 sec intervals for 5 reps.  Pt did report decreased pain                PT Short Term Goals - 05/01/19 0816      PT SHORT TERM GOAL #1   Title  Pt will initiate HEP in order to indicate improved functional mobility and decreased fall risk.  (STG DUE 11/13)    Baseline  met 08/07/18    Time  4    Period  Weeks    Status  Achieved      PT SHORT TERM GOAL #2   Title  Pt will improve gait speed to >/= 2.57 ft/sec with LRAD in order to indicate decreased fall risk     Baseline  10/14: 2.27 ft/ sec with no AD to 3.16 ft/sec without AD on 08/07/18    Time  4    Period  Weeks    Status  Achieved      PT SHORT TERM GOAL #3   Title  Pt will perform 5TSS </=17.57 secs with single UE support only in order to indicate  decreased fall risk and improved functional strength.      Baseline  10/14: 22.74 from standard height chair with B UE assist to 16.38 secs without UE support.     Time  4    Period  Weeks    Status  Achieved      PT SHORT TERM GOAL #4   Title  Pt will improve BERG balance score to >/=34/56 in order to indicate decreased fall risk.     Baseline  10/14: 30/56 indicative of high fall risk potential to 51/56 on 08/07/18    Time  4    Period  Weeks    Status  Achieved      PT SHORT TERM GOAL #5   Title  Pt will negotiate up/down 8 steps with bilateral rails with supervision in order to indicate improved LE strength    Baseline  met with single rail reciprocal pattern 08/07/18    Time  4    Period  Weeks    Status  Achieved      PT SHORT TERM GOAL #6   Title  Pt will ambulate 300' on uneven surfaces, ramps, curbs, inclines, demonstrating ability to vary gait speed when cued using LRAD and therapist providing supervision to improve safety with community distances    met 08/07/18   Time  4    Period  Weeks    Status  Achieved      PT SHORT TERM GOAL #7   Title  Pt will participate in 6 min walk test to assess baseline endurance with functional mobility     Time  4  Period  Weeks    Status  Achieved        PT Long Term Goals - 05/24/19 1540      PT LONG TERM GOAL #1   Title  Pt will be independent with final HEP in order to indicate improved functional mobilty and decreased fall risk.   (UPDATED LTG DUE 7/67/20 per recent recert)    Baseline  updated program 8/10    Time  4    Period  Weeks    Status  On-going      PT LONG TERM GOAL #2   Title  Pt will improve gait speed to >/=4.10 ft/sec w/ LRAD in order to indicate decreased fall risk and safe community ambulator.     Baseline  10/24/18: 3.35 ft/sec no AD     Time  4    Period  Weeks    Status  On-going      PT LONG TERM GOAL #3   Title  Pt will perform 5TSS in </=13 secs without UE support in order to indicate  decreased fall risk and improved functional strength.     Baseline  16 sec 8/10    Status  On-going      PT LONG TERM GOAL #4   Title  Pt will improve FGA to >/=25/30 in order to indicate decreased fall risk.     Baseline  04/30/19: 24/30 scored today    Time  4    Period  Weeks    Status  On-going      PT LONG TERM GOAL #5   Title  Pt will negotiate up/down 8 steps without rail in reciprocal pattern while carrying item (ie grocery bags) in order to indicate improved safety with home entry/exit.     Baseline  10/26/18: continues to need intermittent touch to rails with reciprocal pattern with supervision    Time  4    Period  Weeks    Status  On-going      PT LONG TERM GOAL #6   Title  Pt will ambulate x 500' outdoors/indoors without AD with less antaglic gait pattern in order to indicate improved fluidity of gait and decreased pain.     Baseline  still with slight antalgic gait and difficulty advancing Rt leg at times    Status  On-going      PT LONG TERM GOAL #7   Title  Pt will improve 6 min walk test to1589' indicating improvement with functional endurance    10/25: Patient performs 675 with rollator to 818' 08/10/18   Baseline  did not test today due to time contraints    Time  4    Period  Weeks    Status  On-going            Plan - 05/25/19 1314    Clinical Impression Statement  Today's skilled session focused on strengthening and activity tolerance with pain increasing by .5 increment per pt report at end of session. The pt is progressing toward goals and should benefit from continued PT to progress toward unmet goals.    Rehab Potential  Good    Clinical Impairments Affecting Rehab Potential  Pt is motivated to improve. Strength gains may be limited 2/2 to nerve regeneration process     PT Frequency  2x / week    PT Duration  6 weeks    PT Treatment/Interventions  ADLs/Self Care Home Management;Aquatic Therapy;DME Instruction;Gait training;Stair training;Functional  mobility training;Therapeutic activities;Therapeutic exercise;Balance training;Neuromuscular re-education;Patient/family  education;Electrical Stimulation;Passive range of motion;Energy conservation;Manual techniques;Dry needling;Vestibular    PT Next Visit Plan  continue with high level balance/agility drills/plyometric drills; hip strengthening    PT Home Exercise Plan   [id number]     Consulted and Agree with Plan of Care  Patient       Patient will benefit from skilled therapeutic intervention in order to improve the following deficits and impairments:  Abnormal gait, Decreased activity tolerance, Decreased balance, Decreased coordination, Decreased endurance, Decreased mobility, Decreased range of motion, Decreased strength, Impaired perceived functional ability, Impaired flexibility, Impaired sensation, Pain, Decreased knowledge of use of DME, Decreased knowledge of precautions, Obesity  Visit Diagnosis: Muscle weakness (generalized)  Unsteadiness on feet  Chronic midline low back pain without sciatica     Problem List Patient Active Problem List   Diagnosis Date Noted  . Flat foot 05/07/2019  . Abnormality of gait 02/22/2019  . Lumbosacral radiculopathy at L4 06/09/2018  . Generalized anxiety disorder   . Pain   . Fall   . Labile blood pressure   . Neuropathic pain   . Therapeutic opioid induced constipation   . Urinary retention   . Lumbar radiculopathy 02/21/2018  . PCOS (polycystic ovarian syndrome)   . Morbid obesity ([place])   . Depression   . Generalized OA   . Postoperative pain   . Acute blood loss anemia   . Status post surgery 02/17/2018  . Lumbar radiculopathy, chronic 02/17/2018  . Primary localized osteoarthritis of right hip 04/21/2016  . Sinusitis 06/25/2015  . Cough 06/25/2015  . Essential hypertension 04/03/2015  . Dehydration 04/03/2015  . CAP (community acquired pneumonia) 03/31/2015  . Right lower lobe pneumonia ([place]) 03/31/2015  . Fever 03/31/2015   . Pregnancy 04/29/2014  . Miscarriage   . Incomplete miscarriage 04/02/2011    Class: Acute  . Deep vein thrombosis (DVT) (HCC) 04/02/2011    Class: Chronic    Willow Ora, PTA, [facility] [place] [address] [place], [place] [address] [phone] 05/26/19, 2:55 PM   Name: Ashley [name] MRN: [id number] Date of Birth: [birth year]

## 2019-05-30 ENCOUNTER — Ambulatory Visit: Payer: Medicare Other | Admitting: Physical Therapy

## 2019-05-31 ENCOUNTER — Ambulatory Visit: Payer: Medicare Other | Admitting: Physical Therapy

## 2019-05-31 ENCOUNTER — Other Ambulatory Visit: Payer: Self-pay

## 2019-05-31 ENCOUNTER — Encounter: Payer: Self-pay | Admitting: Physical Therapy

## 2019-05-31 DIAGNOSIS — G8929 Other chronic pain: Secondary | ICD-10-CM

## 2019-05-31 DIAGNOSIS — M6281 Muscle weakness (generalized): Secondary | ICD-10-CM

## 2019-05-31 DIAGNOSIS — R2681 Unsteadiness on feet: Secondary | ICD-10-CM

## 2019-05-31 DIAGNOSIS — M545 Low back pain, unspecified: Secondary | ICD-10-CM

## 2019-06-01 NOTE — Therapy (Addendum)
[facility] [address] [place], [place], [address] Phone: [phone]   Fax:  [phone]  Physical Therapy Treatment/Progress note  Progress Note reporting period 04/30/19 to 05/31/19  See below for objective and subjective measurements relating to patients progress with PT. [name], PT, DPT 06/02/19 9:49 AM    Patient Details  Name: Ashley [name] MRN: [id number] Date of Birth: 1976-02-05 Referring Provider (PT): Dr. Kristeen Miss    Encounter Date: 05/31/2019  PT End of Session - 05/31/19 0941    Visit Number  30    Number of Visits  36    Date for PT Re-Evaluation  06/11/19    Authorization Type  [employer] Medicare-needs 10th visit progress note    Authorization - Visit Number  20    Authorization - Number of Visits  22    PT Start Time  [phone]   pt running late today   PT Stop Time  1015    PT Time Calculation (min)  36 min    Activity Tolerance  Patient tolerated treatment well;Patient limited by pain    Behavior During Therapy  [facility] for tasks assessed/performed       Past Medical History:  Diagnosis Date  . Arthritis    knees, back  . 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 Complication of anesthesia   . Depression   . History of pneumonia   . Hypertension   . Lumbar spondylosis   . PCOS (polycystic ovarian syndrome)   . Pneumonia    2016  . PONV (postoperative nausea and vomiting)     Past Surgical History:  Procedure Laterality Date  . ABDOMINAL EXPOSURE N/A 02/17/2018   Procedure: ABDOMINAL EXPOSURE;  Surgeon: Rosetta Posner, MD;  Location: [facility] OR;  Service: Vascular;  Laterality: N/A;  . ANKLE SURGERY     x 2  . ANTERIOR LAT LUMBAR FUSION N/A 02/17/2018   Procedure: Lumbar three-four Lumbar four-five  Anterolateral lumbar interbody fusion;  Surgeon: Kristeen Miss, MD;  Location: [facility];  Service: Neurosurgery;  Laterality: N/A;  . ANTERIOR LUMBAR FUSION N/A 02/17/2018   Procedure:  Lumbar five-Sacral one Anterior lumbar interbody fusion;  Surgeon: Kristeen Miss, MD;  Location: [facility];  Service: Neurosurgery;  Laterality: N/A;  . APPLICATION OF ROBOTIC ASSISTANCE FOR SPINAL PROCEDURE N/A 02/17/2018   Procedure: APPLICATION OF ROBOTIC ASSISTANCE FOR SPINAL PROCEDURE;  Surgeon: Kristeen Miss, MD;  Location: [facility];  Service: Neurosurgery;  Laterality: N/A;  . APPLICATION OF ROBOTIC ASSISTANCE FOR SPINAL PROCEDURE N/A 06/09/2018   Procedure: APPLICATION OF ROBOTIC ASSISTANCE FOR SPINAL PROCEDURE;  Surgeon: Kristeen Miss, MD;  Location: [facility];  Service: Neurosurgery;  Laterality: N/A;  . BACK SURGERY     2017  discectomy  . CARPAL TUNNEL RELEASE Left 07/04/2014   Procedure: LEFT CARPAL TUNNEL RELEASE;  Surgeon: Ninetta Lights, MD;  Location: [facility];  Service: Orthopedics;  Laterality: Left;  . CARPAL TUNNEL RELEASE Right 08/08/2014   Procedure: RIGHT CARPAL TUNNEL RELEASE;  Surgeon: Ninetta Lights, MD;  Location: [facility];  Service: Orthopedics;  Laterality: Right;  . DILATION AND CURETTAGE OF UTERUS    . DILATION AND EVACUATION  04/02/2011   Procedure: DILATATION AND EVACUATION (D&E);  Surgeon: Luz Lex, MD;  Location: [facility];  Service: Gynecology;  Laterality: N/A;  dvt left mid thigh  . DORSAL COMPARTMENT RELEASE Left 07/04/2014   Procedure: LEFT DEQUERVAINS;  Surgeon: Ninetta Lights,  MD;  Location: [facility];  Service: Orthopedics;  Laterality: Left;  . ENDOSCOPIC PLANTAR FASCIOTOMY Left 02/01/2002  . HARDWARE REMOVAL Right 06/09/2018   Procedure: Repositioning of Right Lumbar three Right lumbar  four pedicle screw with MAZOR;  Surgeon: Kristeen Miss, MD;  Location: [facility];  Service: Neurosurgery;  Laterality: Right;  . HYSTEROSCOPY W/D&C  07/17/2010   with exc. endometrial polyps  . KNEE ARTHROSCOPY Right 03/07/2003; 01/14/2005; 08/31/2006  . KNEE ARTHROSCOPY Left 03/21/2003; 11/26/2004  . LUMBAR PERCUTANEOUS PEDICLE SCREW 3  LEVEL N/A 02/17/2018   Procedure: LUMBAR PERCUTANEOUS PEDICLE SCREW PLACEMENT LUMBAR THREE-SACRAL ONE;  Surgeon: Kristeen Miss, MD;  Location: [facility];  Service: Neurosurgery;  Laterality: N/A;  . SHOULDER ARTHROSCOPY Right   . TOE SURGERY Left 01/10/2003   claw toe correction 2nd, 3rd, 4th toes  . TOTAL HIP ARTHROPLASTY Right 04/21/2016  . TOTAL HIP ARTHROPLASTY Right 04/21/2016   Procedure: TOTAL HIP ARTHROPLASTY ANTERIOR APPROACH;  Surgeon: Ninetta Lights, MD;  Location: [facility];  Service: Orthopedics;  Laterality: Right;  . TOTAL KNEE ARTHROPLASTY Left 01/21/2010  . TOTAL KNEE ARTHROPLASTY Right 08/26/2008    There were no vitals filed for this visit.  Subjective Assessment - 05/31/19 0940    Subjective  Reports the right hip is "acting up" today, "it's taking over the back pain". No falls. Has not looked into returning to gym as yet.    Pertinent History  Past Surgical Hx: B TKA (2009, 2011) and R THA 2017. PMH includes; OA, Morbid Obesity, Depression, HTN, Lumbar Spondylosis, Polycystic Ovarian Syndrome, Pneumonia, and PONV    Limitations  Standing;Walking;Lifting;Sitting    How long can you sit comfortably?  Sitting is uncomfortable. No amount of time specified.     How long can you stand comfortably?  about 5-10 mins    How long can you walk comfortably?  Short period of time with no time period specified    Patient Stated Goals  Reports goal of being more mobile with increased flexibility. Wants to "atleast put lotion on her feet."    Currently in Pain?  Yes    Pain Score  5    8-9/10 last 24 hours.   Pain Location  Hip    Pain Orientation  Right    Pain Descriptors / Indicators  Aching;Tightness    Pain Type  Chronic pain    Pain Onset  More than a month ago    Pain Frequency  Constant    Aggravating Factors   weather, increased activity    Pain Relieving Factors  rest, stretching             [facility] Adult PT Treatment/Exercise - 05/31/19 0943      Lumbar Exercises: Supine    Bridge  10 reps;5 seconds;Limitations    Bridge Limitations  cue for lifting hips as high as possible      Knee/Hip Exercises: Diplomatic Services operational officer  Both;3 reps;30 seconds;Limitations    Active Hamstring Stretch Limitations  using second step of stairs with UE support.     Gastroc Stretch  Right;3 reps;30 seconds;Limitations    Press photographer Limitations  runner stretch with right foot in back to target hip flexor and heel cords    Other Knee/Hip Stretches  in hooklying working on right hip adductor stretching- having pt perform hip fallout on right with gentle overpressure for the stretch, 30 sec holds for 3 reps.       Knee/Hip Exercises: Aerobic  Other Aerobic  Scifit LE/UE's  for 8 minutes level 7.0 x 5 minutes for strengthening/activity tolerance.       Knee/Hip Exercises: Standing   Forward Step Up  Both;1 set;10 reps;Step Height: 6";Limitations;Hand Hold: 0    Forward Step Up Limitations  at bottom of steps with light touch to rails for balance.                PT Short Term Goals - 05/01/19 0816      PT SHORT TERM GOAL #1   Title  Pt will initiate HEP in order to indicate improved functional mobility and decreased fall risk.  (STG DUE 11/13)    Baseline  met 08/07/18    Time  4    Period  Weeks    Status  Achieved      PT SHORT TERM GOAL #2   Title  Pt will improve gait speed to >/= 2.57 ft/sec with LRAD in order to indicate decreased fall risk     Baseline  10/14: 2.27 ft/ sec with no AD to 3.16 ft/sec without AD on 08/07/18    Time  4    Period  Weeks    Status  Achieved      PT SHORT TERM GOAL #3   Title  Pt will perform 5TSS </=17.57 secs with single UE support only in order to indicate decreased fall risk and improved functional strength.      Baseline  10/14: 22.74 from standard height chair with B UE assist to 16.38 secs without UE support.     Time  4    Period  Weeks    Status  Achieved      PT SHORT TERM GOAL #4   Title  Pt will  improve BERG balance score to >/=34/56 in order to indicate decreased fall risk.     Baseline  10/14: 30/56 indicative of high fall risk potential to 51/56 on 08/07/18    Time  4    Period  Weeks    Status  Achieved      PT SHORT TERM GOAL #5   Title  Pt will negotiate up/down 8 steps with bilateral rails with supervision in order to indicate improved LE strength    Baseline  met with single rail reciprocal pattern 08/07/18    Time  4    Period  Weeks    Status  Achieved      PT SHORT TERM GOAL #6   Title  Pt will ambulate 300' on uneven surfaces, ramps, curbs, inclines, demonstrating ability to vary gait speed when cued using LRAD and therapist providing supervision to improve safety with community distances    met 08/07/18   Time  4    Period  Weeks    Status  Achieved      PT SHORT TERM GOAL #7   Title  Pt will participate in 6 min walk test to assess baseline endurance with functional mobility     Time  4    Period  Weeks    Status  Achieved        PT Long Term Goals - 05/24/19 1540      PT LONG TERM GOAL #1   Title  Pt will be independent with final HEP in order to indicate improved functional mobilty and decreased fall risk.   (UPDATED LTG DUE 8/32/54 per recent recert)    Baseline  updated program 8/10    Time  4  Period  Weeks    Status  On-going      PT LONG TERM GOAL #2   Title  Pt will improve gait speed to >/=4.10 ft/sec w/ LRAD in order to indicate decreased fall risk and safe community ambulator.     Baseline  10/24/18: 3.35 ft/sec no AD     Time  4    Period  Weeks    Status  On-going      PT LONG TERM GOAL #3   Title  Pt will perform 5TSS in </=13 secs without UE support in order to indicate decreased fall risk and improved functional strength.     Baseline  16 sec 8/10    Status  On-going      PT LONG TERM GOAL #4   Title  Pt will improve FGA to >/=25/30 in order to indicate decreased fall risk.     Baseline  04/30/19: 24/30 scored today    Time  4     Period  Weeks    Status  On-going      PT LONG TERM GOAL #5   Title  Pt will negotiate up/down 8 steps without rail in reciprocal pattern while carrying item (ie grocery bags) in order to indicate improved safety with home entry/exit.     Baseline  10/26/18: continues to need intermittent touch to rails with reciprocal pattern with supervision    Time  4    Period  Weeks    Status  On-going      PT LONG TERM GOAL #6   Title  Pt will ambulate x 500' outdoors/indoors without AD with less antaglic gait pattern in order to indicate improved fluidity of gait and decreased pain.     Baseline  still with slight antalgic gait and difficulty advancing Rt leg at times    Status  On-going      PT LONG TERM GOAL #7   Title  Pt will improve 6 min walk test to1589' indicating improvement with functional endurance    10/25: Patient performs 675 with rollator to 818' 08/10/18   Baseline  did not test today due to time contraints    Time  4    Period  Weeks    Status  On-going            Plan - 05/31/19 0946    Clinical Impression Statement  Today's skilled session continued to focus on LE stretching and strenghening with mild increase in pain (1-2 increments) reported that decreased with rest breaks. The pt is on the wait list for aquatics and plan of care ends next week. Will plan to assess goals next week for anticpated recert to continue to address LE weakness and high level balance.    Rehab Potential  Good    Clinical Impairments Affecting Rehab Potential  Pt is motivated to improve. Strength gains may be limited 2/2 to nerve regeneration process     PT Frequency  2x / week    PT Duration  6 weeks    PT Treatment/Interventions  ADLs/Self Care Home Management;Aquatic Therapy;DME Instruction;Gait training;Stair training;Functional mobility training;Therapeutic activities;Therapeutic exercise;Balance training;Neuromuscular re-education;Patient/family education;Electrical Stimulation;Passive  range of motion;Energy conservation;Manual techniques;Dry needling;Vestibular    PT Next Visit Plan  goals due next week, begin checking them at next visit.    PT Home Exercise Plan   [id number]     Consulted and Agree with Plan of Care  Patient       Patient will benefit from skilled  therapeutic intervention in order to improve the following deficits and impairments:  Abnormal gait, Decreased activity tolerance, Decreased balance, Decreased coordination, Decreased endurance, Decreased mobility, Decreased range of motion, Decreased strength, Impaired perceived functional ability, Impaired flexibility, Impaired sensation, Pain, Decreased knowledge of use of DME, Decreased knowledge of precautions, Obesity  Visit Diagnosis: Muscle weakness (generalized)  Unsteadiness on feet  Chronic midline low back pain without sciatica     Problem List Patient Active Problem List   Diagnosis Date Noted  . Flat foot 05/07/2019  . Abnormality of gait 02/22/2019  . Lumbosacral radiculopathy at L4 06/09/2018  . Generalized anxiety disorder   . Pain   . Fall   . Labile blood pressure   . Neuropathic pain   . Therapeutic opioid induced constipation   . Urinary retention   . Lumbar radiculopathy 02/21/2018  . PCOS (polycystic ovarian syndrome)   . Morbid obesity ([place])   . Depression   . Generalized OA   . Postoperative pain   . Acute blood loss anemia   . Status post surgery 02/17/2018  . Lumbar radiculopathy, chronic 02/17/2018  . Primary localized osteoarthritis of right hip 04/21/2016  . Sinusitis 06/25/2015  . Cough 06/25/2015  . Essential hypertension 04/03/2015  . Dehydration 04/03/2015  . CAP (community acquired pneumonia) 03/31/2015  . Right lower lobe pneumonia ([place]) 03/31/2015  . Fever 03/31/2015  . Pregnancy 04/29/2014  . Miscarriage   . Incomplete miscarriage 04/02/2011    Class: Acute  . Deep vein thrombosis (DVT) (HCC) 04/02/2011    Class: Chronic    Willow Ora, PTA,  [facility] [address], [place], [place] [address] [phone] 06/01/19, 10:47 AM   Name: AZELIE NOGUERA MRN: [id number] Date of Birth: Mar 06, 1976

## 2019-06-04 ENCOUNTER — Other Ambulatory Visit: Payer: Self-pay

## 2019-06-04 ENCOUNTER — Ambulatory Visit: Payer: Medicare Other | Admitting: Physical Therapy

## 2019-06-04 DIAGNOSIS — M6281 Muscle weakness (generalized): Secondary | ICD-10-CM

## 2019-06-04 DIAGNOSIS — R2681 Unsteadiness on feet: Secondary | ICD-10-CM

## 2019-06-04 DIAGNOSIS — M545 Low back pain, unspecified: Secondary | ICD-10-CM

## 2019-06-04 DIAGNOSIS — G8929 Other chronic pain: Secondary | ICD-10-CM

## 2019-06-05 ENCOUNTER — Ambulatory Visit: Payer: Medicare Other | Admitting: Physical Therapy

## 2019-06-05 NOTE — Therapy (Signed)
[facility] [address] [place], [place], [address] Phone: [phone]   Fax:  [phone]  Physical Therapy Treatment  Patient Details  Name: Ashley [name] MRN: [id number] Date of Birth: 30-Nov-1975 Referring Provider (PT): Dr. Kristeen Miss    Encounter Date: 06/04/2019  PT End of Session - 06/05/19 0751    Visit Number  31    Number of Visits  36    Date for PT Re-Evaluation  06/11/19    Authorization Type  [employer] Medicare-needs 10th visit progress note    Authorization - Visit Number  21    Authorization - Number of Visits  22    PT Start Time  6063    PT Stop Time  1630    PT Time Calculation (min)  45 min    Equipment Utilized During Treatment  --   floatation belt and bar bells used   Activity Tolerance  Patient tolerated treatment well;Patient limited by pain    Behavior During Therapy  WFL for tasks assessed/performed       Past Medical History:  Diagnosis Date  . Arthritis    knees, back  . 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 Complication of anesthesia   . Depression   . History of pneumonia   . Hypertension   . Lumbar spondylosis   . PCOS (polycystic ovarian syndrome)   . Pneumonia    2016  . PONV (postoperative nausea and vomiting)     Past Surgical History:  Procedure Laterality Date  . ABDOMINAL EXPOSURE N/A 02/17/2018   Procedure: ABDOMINAL EXPOSURE;  Surgeon: Rosetta Posner, MD;  Location: [facility];  Service: Vascular;  Laterality: N/A;  . ANKLE SURGERY     x 2  . ANTERIOR LAT LUMBAR FUSION N/A 02/17/2018   Procedure: Lumbar three-four Lumbar four-five  Anterolateral lumbar interbody fusion;  Surgeon: Kristeen Miss, MD;  Location: [facility];  Service: Neurosurgery;  Laterality: N/A;  . ANTERIOR LUMBAR FUSION N/A 02/17/2018   Procedure: Lumbar five-Sacral one Anterior lumbar interbody fusion;  Surgeon: Kristeen Miss, MD;  Location: [place] [facility];  Service: Neurosurgery;  Laterality: N/A;  .  APPLICATION OF ROBOTIC ASSISTANCE FOR SPINAL PROCEDURE N/A 02/17/2018   Procedure: APPLICATION OF ROBOTIC ASSISTANCE FOR SPINAL PROCEDURE;  Surgeon: Kristeen Miss, MD;  Location: [facility];  Service: Neurosurgery;  Laterality: N/A;  . APPLICATION OF ROBOTIC ASSISTANCE FOR SPINAL PROCEDURE N/A 06/09/2018   Procedure: APPLICATION OF ROBOTIC ASSISTANCE FOR SPINAL PROCEDURE;  Surgeon: Kristeen Miss, MD;  Location: [facility];  Service: Neurosurgery;  Laterality: N/A;  . BACK SURGERY     2017  discectomy  . CARPAL TUNNEL RELEASE Left 07/04/2014   Procedure: LEFT CARPAL TUNNEL RELEASE;  Surgeon: Ninetta Lights, MD;  Location: [place] [facility];  Service: Orthopedics;  Laterality: Left;  . CARPAL TUNNEL RELEASE Right 08/08/2014   Procedure: RIGHT CARPAL TUNNEL RELEASE;  Surgeon: Ninetta Lights, MD;  Location: [facility];  Service: Orthopedics;  Laterality: Right;  . DILATION AND CURETTAGE OF UTERUS    . DILATION AND EVACUATION  04/02/2011   Procedure: DILATATION AND EVACUATION (D&E);  Surgeon: Luz Lex, MD;  Location: [facility];  Service: Gynecology;  Laterality: N/A;  dvt left mid thigh  . DORSAL COMPARTMENT RELEASE Left 07/04/2014   Procedure: LEFT DEQUERVAINS;  Surgeon: Ninetta Lights, MD;  Location: [place] [facility];  Service: Orthopedics;  Laterality: Left;  . ENDOSCOPIC PLANTAR FASCIOTOMY Left 02/01/2002  .  HARDWARE REMOVAL Right 06/09/2018   Procedure: Repositioning of Right Lumbar three Right lumbar  four pedicle screw with MAZOR;  Surgeon: Kristeen Miss, MD;  Location: [facility];  Service: Neurosurgery;  Laterality: Right;  . HYSTEROSCOPY W/D&C  07/17/2010   with exc. endometrial polyps  . KNEE ARTHROSCOPY Right 03/07/2003; 01/14/2005; 08/31/2006  . KNEE ARTHROSCOPY Left 03/21/2003; 11/26/2004  . LUMBAR PERCUTANEOUS PEDICLE SCREW 3 LEVEL N/A 02/17/2018   Procedure: LUMBAR PERCUTANEOUS PEDICLE SCREW PLACEMENT LUMBAR THREE-SACRAL ONE;  Surgeon: Kristeen Miss, MD;  Location: [facility];   Service: Neurosurgery;  Laterality: N/A;  . SHOULDER ARTHROSCOPY Right   . TOE SURGERY Left 01/10/2003   claw toe correction 2nd, 3rd, 4th toes  . TOTAL HIP ARTHROPLASTY Right 04/21/2016  . TOTAL HIP ARTHROPLASTY Right 04/21/2016   Procedure: TOTAL HIP ARTHROPLASTY ANTERIOR APPROACH;  Surgeon: Ninetta Lights, MD;  Location: [facility];  Service: Orthopedics;  Laterality: Right;  . TOTAL KNEE ARTHROPLASTY Left 01/21/2010  . TOTAL KNEE ARTHROPLASTY Right 08/26/2008    There were no vitals filed for this visit.  Subjective Assessment - 06/05/19 0750    Subjective  Pt presents for aquatic therapy at [facility] - states her Rt hip feels tight    Currently in Pain?  Yes    Pain Score  4     Pain Location  Hip    Pain Orientation  Right    Pain Descriptors / Indicators  Aching;Tightness    Pain Type  Chronic pain       Aquatic therapy at [facility] - pool temp 87.2 degrees  Patient seen for aquatic therapy today.  Treatment took place in water 3.5-4 feet deep depending upon activity.  Pt entered  and exited the pool via step negotiation - step by step sequence with use of rails with supervision for safety.  Pt performed Runner'ss stretch RLE 30 sec hold x 1 rep;  Rt heel cord stretch in standing with forefoot on wall 30 sec hold  Pt perfomed water walking 58mx 2 reps forward with cues for increased initial heel strike at stance and incr. reciprocal arm swing; Bar bells used on 2nd rep for slight increased resistance and for assist with balance Pt performed sideways amb. 221m 1 rep without UE support; 2544m1 rep sideways with small squat   Pt performed Rt hip flexion, extension and abduction (no additional resistance other than viscosity of water) 15 reps each direction;  10 reps LLE for Same exercises - hip flexion, extension and abduction for RLE isometric contractions and stabilization as well as SLS on RLE  Marching in place 10 reps each leg; progressed to marching forwards/backwards across pool 31m59m  rep each direction  Pelvic tilts in partial squated/ position against wall - 5 reps slow ROM due to c/o discomfort; 5 reps with 3-4 sec hold   Pt performed ambulation forward 31m 42mrep at end of session for cool down - cues for heel strike and arm swing - gait pattern much improved  Pt requires aquatic therapy for the buoyancy of the water to allow her to off load her body for spinal decompression and also to reduce the weight of her RLE In the water for decreased pain with AROM; buoyancy of water allows more freedom of movement than can be achieved with land exercise  Pt also requires viscosity of water for strengthening; water allows reduced gait deviaition due to reduced joint loading through buoyancy which facilitates improved  Posture without  excess stress & pain                           PT Short Term Goals - 06/05/19 0758      PT SHORT TERM GOAL #1   Title  Pt will initiate HEP in order to indicate improved functional mobility and decreased fall risk.  (STG DUE 11/13)    Baseline  met 08/07/18    Time  4    Period  Weeks    Status  Achieved      PT SHORT TERM GOAL #2   Title  Pt will improve gait speed to >/= 2.57 ft/sec with LRAD in order to indicate decreased fall risk     Baseline  10/14: 2.27 ft/ sec with no AD to 3.16 ft/sec without AD on 08/07/18    Time  4    Period  Weeks    Status  Achieved      PT SHORT TERM GOAL #3   Title  Pt will perform 5TSS </=17.57 secs with single UE support only in order to indicate decreased fall risk and improved functional strength.      Baseline  10/14: 22.74 from standard height chair with B UE assist to 16.38 secs without UE support.     Time  4    Period  Weeks    Status  Achieved      PT SHORT TERM GOAL #4   Title  Pt will improve BERG balance score to >/=34/56 in order to indicate decreased fall risk.     Baseline  10/14: 30/56 indicative of high fall risk potential to 51/56 on 08/07/18    Time  4     Period  Weeks    Status  Achieved      PT SHORT TERM GOAL #5   Title  Pt will negotiate up/down 8 steps with bilateral rails with supervision in order to indicate improved LE strength    Baseline  met with single rail reciprocal pattern 08/07/18    Time  4    Period  Weeks    Status  Achieved      PT SHORT TERM GOAL #6   Title  Pt will ambulate 300' on uneven surfaces, ramps, curbs, inclines, demonstrating ability to vary gait speed when cued using LRAD and therapist providing supervision to improve safety with community distances    met 08/07/18   Time  4    Period  Weeks    Status  Achieved      PT SHORT TERM GOAL #7   Title  Pt will participate in 6 min walk test to assess baseline endurance with functional mobility     Time  4    Period  Weeks    Status  Achieved        PT Long Term Goals - 06/05/19 0758      PT LONG TERM GOAL #1   Title  Pt will be independent with final HEP in order to indicate improved functional mobilty and decreased fall risk.   (UPDATED LTG DUE 5/95/63 per recent recert)    Baseline  updated program 8/10    Time  4    Period  Weeks    Status  On-going      PT LONG TERM GOAL #2   Title  Pt will improve gait speed to >/=4.10 ft/sec w/ LRAD in order to indicate decreased fall risk and safe community  ambulator.     Baseline  10/24/18: 3.35 ft/sec no AD     Time  4    Period  Weeks    Status  On-going      PT LONG TERM GOAL #3   Title  Pt will perform 5TSS in </=13 secs without UE support in order to indicate decreased fall risk and improved functional strength.     Baseline  16 sec 8/10    Status  On-going      PT LONG TERM GOAL #4   Title  Pt will improve FGA to >/=25/30 in order to indicate decreased fall risk.     Baseline  04/30/19: 24/30 scored today    Time  4    Period  Weeks    Status  On-going      PT LONG TERM GOAL #5   Title  Pt will negotiate up/down 8 steps without rail in reciprocal pattern while carrying item (ie grocery bags)  in order to indicate improved safety with home entry/exit.     Baseline  10/26/18: continues to need intermittent touch to rails with reciprocal pattern with supervision    Time  4    Period  Weeks    Status  On-going      PT LONG TERM GOAL #6   Title  Pt will ambulate x 500' outdoors/indoors without AD with less antaglic gait pattern in order to indicate improved fluidity of gait and decreased pain.     Baseline  still with slight antalgic gait and difficulty advancing Rt leg at times    Status  On-going      PT LONG TERM GOAL #7   Title  Pt will improve 6 min walk test to1589' indicating improvement with functional endurance    10/25: Patient performs 675 with rollator to 818' 08/10/18   Baseline  did not test today due to time contraints    Time  4    Period  Weeks    Status  On-going            Plan - 06/05/19 0753    Clinical Impression Statement  Pt participated in first aquatic therapy session with focus on ambulation and ROM exercises for RLE and lumbar stabilization exercises.  Pt tolerated exercises well, but needed to decrease ROM with Rt hip flexion and abduction due to discomfort experienced with the initial large ROM with the buoyancy assisted exercise.  Pt's gait pattern much improved at end of session with pt achieving increased initial heel contact at stance and was able to increase reciprocal arm swing with cues.  Pt reported pain level 3.5 intensity at end of session.    Rehab Potential  Good    Clinical Impairments Affecting Rehab Potential  Pt is motivated to improve. Strength gains may be limited 2/2 to nerve regeneration process     PT Frequency  2x / week    PT Duration  6 weeks    PT Treatment/Interventions  ADLs/Self Care Home Management;Aquatic Therapy;DME Instruction;Gait training;Stair training;Functional mobility training;Therapeutic activities;Therapeutic exercise;Balance training;Neuromuscular re-education;Patient/family education;Electrical  Stimulation;Passive range of motion;Energy conservation;Manual techniques;Dry needling;Vestibular    PT Next Visit Plan  goals due next week, begin checking them at next visit.    PT Home Exercise Plan   [id number]     Consulted and Agree with Plan of Care  Patient       Patient will benefit from skilled therapeutic intervention in order to improve the following deficits and impairments:  Abnormal  gait, Decreased activity tolerance, Decreased balance, Decreased coordination, Decreased endurance, Decreased mobility, Decreased range of motion, Decreased strength, Impaired perceived functional ability, Impaired flexibility, Impaired sensation, Pain, Decreased knowledge of use of DME, Decreased knowledge of precautions, Obesity  Visit Diagnosis: Muscle weakness (generalized)  Chronic midline low back pain without sciatica  Unsteadiness on feet     Problem List Patient Active Problem List   Diagnosis Date Noted  . Flat foot 05/07/2019  . Abnormality of gait 02/22/2019  . Lumbosacral radiculopathy at L4 06/09/2018  . Generalized anxiety disorder   . Pain   . Fall   . Labile blood pressure   . Neuropathic pain   . Therapeutic opioid induced constipation   . Urinary retention   . Lumbar radiculopathy 02/21/2018  . PCOS (polycystic ovarian syndrome)   . Morbid obesity ([place])   . Depression   . Generalized OA   . Postoperative pain   . Acute blood loss anemia   . Status post surgery 02/17/2018  . Lumbar radiculopathy, chronic 02/17/2018  . Primary localized osteoarthritis of right hip 04/21/2016  . Sinusitis 06/25/2015  . Cough 06/25/2015  . Essential hypertension 04/03/2015  . Dehydration 04/03/2015  . CAP (community acquired pneumonia) 03/31/2015  . Right lower lobe pneumonia ([place]) 03/31/2015  . Fever 03/31/2015  . Pregnancy 04/29/2014  . Miscarriage   . Incomplete miscarriage 04/02/2011    Class: Acute  . Deep vein thrombosis (DVT) ([place]) 04/02/2011    Class: Chronic     Charli Liberatore, [place], PT, ATRIC 06/05/2019, 8:00 AM  [facility] [address] [place], [place], [address] Phone: [phone]   Fax:  [phone]  Name: CHINELO BENN MRN: [id number] Date of Birth: 12/08/1975

## 2019-06-06 ENCOUNTER — Ambulatory Visit: Payer: Medicare Other | Admitting: Physical Therapy

## 2019-06-06 ENCOUNTER — Other Ambulatory Visit: Payer: Self-pay

## 2019-06-06 ENCOUNTER — Encounter: Payer: Self-pay | Admitting: Physical Therapy

## 2019-06-06 DIAGNOSIS — M6281 Muscle weakness (generalized): Secondary | ICD-10-CM | POA: Diagnosis not present

## 2019-06-06 DIAGNOSIS — M545 Low back pain, unspecified: Secondary | ICD-10-CM

## 2019-06-06 DIAGNOSIS — R2681 Unsteadiness on feet: Secondary | ICD-10-CM

## 2019-06-06 DIAGNOSIS — G8929 Other chronic pain: Secondary | ICD-10-CM

## 2019-06-07 ENCOUNTER — Ambulatory Visit: Payer: Medicare Other | Admitting: Physical Therapy

## 2019-06-07 NOTE — Therapy (Addendum)
[facility] [address] [place], [place], [address] Phone: [phone]   Fax:  [phone]  Physical Therapy Treatment  Patient Details  Name: Ashley [name] MRN: [id number] Date of Birth: 19-Apr-1976 Referring Provider (PT): Dr. Kristeen Miss    Encounter Date: 06/06/2019  PT End of Session - 06/06/19 1418    Visit Number  32   Number of Visits  40    Date for PT Re-Evaluation  29/79/89 (recert sent 10/31/92)   Authorization Type  [employer] [place] 10th visit progress note    Authorization - Visit Number  22    Authorization - Number of Visits  22    PT Start Time  1740   pt late for appt   PT Stop Time  1445    PT Time Calculation (min)  32 min    Equipment Utilized During Treatment  --    Activity Tolerance  Patient tolerated treatment well;Patient limited by pain    Behavior During Therapy  [facility] for tasks assessed/performed       Past Medical History:  Diagnosis Date  . Arthritis    knees, back  . 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 Complication of anesthesia   . Depression   . History of pneumonia   . Hypertension   . Lumbar spondylosis   . PCOS (polycystic ovarian syndrome)   . Pneumonia    2016  . PONV (postoperative nausea and vomiting)     Past Surgical History:  Procedure Laterality Date  . ABDOMINAL EXPOSURE N/A 02/17/2018   Procedure: ABDOMINAL EXPOSURE;  Surgeon: Rosetta Posner, MD;  Location: [facility] OR;  Service: Vascular;  Laterality: N/A;  . ANKLE SURGERY     x 2  . ANTERIOR LAT LUMBAR FUSION N/A 02/17/2018   Procedure: Lumbar three-four Lumbar four-five  Anterolateral lumbar interbody fusion;  Surgeon: Kristeen Miss, MD;  Location: [place];  Service: Neurosurgery;  Laterality: N/A;  . ANTERIOR LUMBAR FUSION N/A 02/17/2018   Procedure: Lumbar five-Sacral one Anterior lumbar interbody fusion;  Surgeon: Kristeen Miss, MD;  Location: [place];  Service: Neurosurgery;  Laterality: N/A;  .  APPLICATION OF ROBOTIC ASSISTANCE FOR SPINAL PROCEDURE N/A 02/17/2018   Procedure: APPLICATION OF ROBOTIC ASSISTANCE FOR SPINAL PROCEDURE;  Surgeon: Kristeen Miss, MD;  Location: [place];  Service: Neurosurgery;  Laterality: N/A;  . APPLICATION OF ROBOTIC ASSISTANCE FOR SPINAL PROCEDURE N/A 06/09/2018   Procedure: APPLICATION OF ROBOTIC ASSISTANCE FOR SPINAL PROCEDURE;  Surgeon: Kristeen Miss, MD;  Location: [place];  Service: Neurosurgery;  Laterality: N/A;  . BACK SURGERY     2017  discectomy  . CARPAL TUNNEL RELEASE Left 07/04/2014   Procedure: LEFT CARPAL TUNNEL RELEASE;  Surgeon: Ninetta Lights, MD;  Location: [place];  Service: Orthopedics;  Laterality: Left;  . CARPAL TUNNEL RELEASE Right 08/08/2014   Procedure: RIGHT CARPAL TUNNEL RELEASE;  Surgeon: Ninetta Lights, MD;  Location: [place];  Service: Orthopedics;  Laterality: Right;  . DILATION AND CURETTAGE OF UTERUS    . DILATION AND EVACUATION  04/02/2011   Procedure: DILATATION AND EVACUATION (D&E);  Surgeon: Luz Lex, MD;  Location: [facility];  Service: Gynecology;  Laterality: N/A;  dvt left mid thigh  . DORSAL COMPARTMENT RELEASE Left 07/04/2014   Procedure: LEFT DEQUERVAINS;  Surgeon: Ninetta Lights, MD;  Location: [facility];  Service: Orthopedics;  Laterality: Left;  . ENDOSCOPIC PLANTAR FASCIOTOMY Left 02/01/2002  .  HARDWARE REMOVAL Right 06/09/2018   Procedure: Repositioning of Right Lumbar three Right lumbar  four pedicle screw with MAZOR;  Surgeon: Kristeen Miss, MD;  Location: [facility] [place];  Service: Neurosurgery;  Laterality: Right;  . HYSTEROSCOPY W/D&C  07/17/2010   with exc. endometrial polyps  . KNEE ARTHROSCOPY Right 03/07/2003; 01/14/2005; 08/31/2006  . KNEE ARTHROSCOPY Left 03/21/2003; 11/26/2004  . LUMBAR PERCUTANEOUS PEDICLE SCREW 3 LEVEL N/A 02/17/2018   Procedure: LUMBAR PERCUTANEOUS PEDICLE SCREW PLACEMENT LUMBAR THREE-SACRAL ONE;  Surgeon: Kristeen Miss, MD;  Location: [facility];   Service: Neurosurgery;  Laterality: N/A;  . SHOULDER ARTHROSCOPY Right   . TOE SURGERY Left 01/10/2003   claw toe correction 2nd, 3rd, 4th toes  . TOTAL HIP ARTHROPLASTY Right 04/21/2016  . TOTAL HIP ARTHROPLASTY Right 04/21/2016   Procedure: TOTAL HIP ARTHROPLASTY ANTERIOR APPROACH;  Surgeon: Ninetta Lights, MD;  Location: [facility];  Service: Orthopedics;  Laterality: Right;  . TOTAL KNEE ARTHROPLASTY Left 01/21/2010  . TOTAL KNEE ARTHROPLASTY Right 08/26/2008    There were no vitals filed for this visit.  Subjective Assessment - 06/06/19 1415    Subjective  Spoke with PCP who stated she could go up to 3x a day for Baclofen for tightness. Has not started this as yet. Felt good after aquatic session. Muscles felt like "jello" after her shower, no increase in pain. Interested in continuing with this.    Pertinent History  Past Surgical Hx: B TKA (2009, 2011) and R THA 2017. PMH includes; OA, Morbid Obesity, Depression, HTN, Lumbar Spondylosis, Polycystic Ovarian Syndrome, Pneumonia, and PONV    How long can you sit comfortably?  Sitting is uncomfortable. No amount of time specified.     How long can you stand comfortably?  about 5-10 mins    How long can you walk comfortably?  Short period of time with no time period specified    Patient Stated Goals  Reports goal of being more mobile with increased flexibility. Wants to "atleast put lotion on her feet."    Currently in Pain?  Yes    Pain Score  3     Pain Location  Hip    Pain Orientation  Right    Pain Descriptors / Indicators  Aching;Tightness    Pain Type  Chronic pain    Pain Onset  More than a month ago    Pain Frequency  Constant    Aggravating Factors   weather, increased activity    Pain Relieving Factors  rest, stretching         [facility] PT Assessment - 06/06/19 1420      Functional Gait  Assessment   Gait assessed   Yes    Gait Level Surface  Walks 20 ft, slow speed, abnormal gait pattern, evidence for imbalance or deviates  10-15 in outside of the 12 in walkway width. Requires more than 7 sec to ambulate 20 ft.   8.03   Change in Gait Speed  Able to smoothly change walking speed without loss of balance or gait deviation. Deviate no more than 6 in outside of the 12 in walkway width.    Gait with Horizontal Head Turns  Performs head turns smoothly with no change in gait. Deviates no more than 6 in outside 12 in walkway width    Gait with Vertical Head Turns  Performs head turns with no change in gait. Deviates no more than 6 in outside 12 in walkway width.    Gait and Pivot Turn  Pivot  turns safely within 3 sec and stops quickly with no loss of balance.    Step Over Obstacle  Is able to step over one shoe box (4.5 in total height) without changing gait speed. No evidence of imbalance.    Gait with Narrow Base of Support  Ambulates 4-7 steps.    Gait with Eyes Closed  Walks 20 ft, uses assistive device, slower speed, mild gait deviations, deviates 6-10 in outside 12 in walkway width. Ambulates 20 ft in less than 9 sec but greater than 7 sec.    Ambulating Backwards  Walks 20 ft, uses assistive device, slower speed, mild gait deviations, deviates 6-10 in outside 12 in walkway width.    Steps  Alternating feet, no rail.    Total Score  23    FGA comment:  19-24 = medium risk fall           [facility] Adult PT Treatment/Exercise - 06/06/19 1420      Transfers   Transfers  Sit to Stand;Stand to Sit    Five time sit to stand comments   11.94 sec's no UE support using standard height chair      Ambulation/Gait   Ambulation/Gait  Yes    Ambulation/Gait Assistance  6: Modified independent (Device/Increase time);5: Supervision    Ambulation/Gait Assistance Details  pt continues to present with antalgic gait with decreased stance on right LE with gait.    Ambulation Distance (Feet)  500 Feet   x1, plus around gym with activity   Assistive device  None    Gait Pattern  Step-through pattern;Antalgic    Ambulation Surface   Level;Indoor;Unlevel;Outdoor;Paved    Gait velocity  10.93 sec's=     Stairs  Yes    Stairs Assistance  4: Min guard;5: Supervision    Stairs Assistance Details (indicate cue type and reason)  supervision to ascend reciprocally carrying 2 grocery bags with groceries, min guard with need of rail for balance to decend reciprocally the 1st time, step to pattern the second time with improved balance/control     Stair Management Technique  No rails;Alternating pattern;Forwards    Number of Stairs  4   x2 reps   Height of Stairs  6               PT Short Term Goals - 06/05/19 0758      PT SHORT TERM GOAL #1   Title  Pt will initiate HEP in order to indicate improved functional mobility and decreased fall risk.  (STG DUE 11/13)    Baseline  met 08/07/18    Time  4    Period  Weeks    Status  Achieved      PT SHORT TERM GOAL #2   Title  Pt will improve gait speed to >/= 2.57 ft/sec with LRAD in order to indicate decreased fall risk     Baseline  10/14: 2.27 ft/ sec with no AD to 3.16 ft/sec without AD on 08/07/18    Time  4    Period  Weeks    Status  Achieved      PT SHORT TERM GOAL #3   Title  Pt will perform 5TSS </=17.57 secs with single UE support only in order to indicate decreased fall risk and improved functional strength.      Baseline  10/14: 22.74 from standard height chair with B UE assist to 16.38 secs without UE support.     Time  4    Period  Weeks    Status  Achieved      PT SHORT TERM GOAL #4   Title  Pt will improve BERG balance score to >/=34/56 in order to indicate decreased fall risk.     Baseline  10/14: 30/56 indicative of high fall risk potential to 51/56 on 08/07/18    Time  4    Period  Weeks    Status  Achieved      PT SHORT TERM GOAL #5   Title  Pt will negotiate up/down 8 steps with bilateral rails with supervision in order to indicate improved LE strength    Baseline  met with single rail reciprocal pattern 08/07/18    Time  4    Period   Weeks    Status  Achieved      PT SHORT TERM GOAL #6   Title  Pt will ambulate 300' on uneven surfaces, ramps, curbs, inclines, demonstrating ability to vary gait speed when cued using LRAD and therapist providing supervision to improve safety with community distances    met 08/07/18   Time  4    Period  Weeks    Status  Achieved      PT SHORT TERM GOAL #7   Title  Pt will participate in 6 min walk test to assess baseline endurance with functional mobility     Time  4    Period  Weeks    Status  Achieved        PT Long Term Goals - 06/06/19 1419      PT LONG TERM GOAL #1   Title  Pt will be independent with final HEP in order to indicate improved functional mobilty and decreased fall risk.   (UPDATED LTG DUE 9/92/42 per recent recert)    Baseline  6/83/41: met with current program    Status  Achieved      PT LONG TERM GOAL #2   Title  Pt will improve gait speed to >/=4.10 ft/sec w/ LRAD in order to indicate decreased fall risk and safe community ambulator.     Baseline  06/06/19: 10.93 sec's= 3.0 ft/sec, decreased from last distance of 3.35 ft/sec on 10/24/18    Time  --    Period  --    Status  Not Met      PT LONG TERM GOAL #3   Title  Pt will perform 5TSS in </=13 secs without UE support in order to indicate decreased fall risk and improved functional strength.     Baseline  06/06/19: 11.94 sec's no UE support from standard height surface    Status  Achieved      PT LONG TERM GOAL #4   Title  Pt will improve FGA to >/=25/30 in order to indicate decreased fall risk.     Baseline  06/06/19: scored 23/30 today, decreased from last score of 24/30 on 04/30/19    Time  --    Period  --    Status  Not Met      PT LONG TERM GOAL #5   Title  Pt will negotiate up/down 8 steps without rail in reciprocal pattern while carrying item (ie grocery bags) in order to indicate improved safety with home entry/exit.     Baseline  06/06/19: continues to need UE support for controlled descent  with reciprocal pattern    Time  --    Period  --    Status  Not Met      PT LONG  TERM GOAL #6   Title  Pt will ambulate x 500' outdoors/indoors without AD with less antaglic gait pattern in order to indicate improved fluidity of gait and decreased pain.     Baseline  06/06/19: continues to present with antalgic gait pattern    Status  Not Met      PT LONG TERM GOAL #7   Title  Pt will improve 6 min walk test to1589' indicating improvement with functional endurance    10/25: Patient performs 675 with rollator to 818' 08/10/18   Baseline  06/06/19: did not test today due to time contraints, can reassess at next visit to re-establish baseline.    Time  --    Period  --    Status  Unable to assess            Plan - 06/06/19 1418    Clinical Impression Statement  Today's skilled session focused on progress toward LTGs with 2 goals met, 4 goals not met and unable to assess remaining goals (6 minute walk distance) due to time contraints as pt was late. Pt had decreased scores with her Functional Gait Assessment and gait speed, therfore those goals no met. Pt also continues to demonstrate an antalgic gait pattern and need UE support on rails to descent reciprocally, therefore those goals were not met as well. She is independent with her HEP and improved her 5 time sit to stand to 11.94 sec's with no UE support from standard height surface.    Rehab Potential  Good    Clinical Impairments Affecting Rehab Potential  Pt is motivated to improve. Strength gains may be limited 2/2 to nerve regeneration process     PT Frequency  2x / week    PT Duration  6 weeks    PT Treatment/Interventions  ADLs/Self Care Home Management;Aquatic Therapy;DME Instruction;Gait training;Stair training;Functional mobility training;Therapeutic activities;Therapeutic exercise;Balance training;Neuromuscular re-education;Patient/family education;Electrical Stimulation;Passive range of motion;Energy conservation;Manual  techniques;Dry needling;Vestibular    PT Next Visit Plan  primary PT to recert. Pt to continue aquatic therapy as she responded well (aquatic PT is working on schedule). continue to work on LE strengthening, high level balance and descentization for pain management    PT Home Exercise Plan   [id number]     Consulted and Agree with Plan of Care  Patient       Patient will benefit from skilled therapeutic intervention in order to improve the following deficits and impairments:  Abnormal gait, Decreased activity tolerance, Decreased balance, Decreased coordination, Decreased endurance, Decreased mobility, Decreased range of motion, Decreased strength, Impaired perceived functional ability, Impaired flexibility, Impaired sensation, Pain, Decreased knowledge of use of DME, Decreased knowledge of precautions, Obesity  Visit Diagnosis: Muscle weakness (generalized)  Chronic midline low back pain without sciatica  Unsteadiness on feet     Problem List Patient Active Problem List   Diagnosis Date Noted  . Flat foot 05/07/2019  . Abnormality of gait 02/22/2019  . Lumbosacral radiculopathy at L4 06/09/2018  . Generalized anxiety disorder   . Pain   . Fall   . Labile blood pressure   . Neuropathic pain   . Therapeutic opioid induced constipation   . Urinary retention   . Lumbar radiculopathy 02/21/2018  . PCOS (polycystic ovarian syndrome)   . Morbid obesity ([place])   . Depression   . Generalized OA   . Postoperative pain   . Acute blood loss anemia   . Status post surgery 02/17/2018  . Lumbar radiculopathy, chronic 02/17/2018  .  Primary localized osteoarthritis of right hip 04/21/2016  . Sinusitis 06/25/2015  . Cough 06/25/2015  . Essential hypertension 04/03/2015  . Dehydration 04/03/2015  . CAP (community acquired pneumonia) 03/31/2015  . Right lower lobe pneumonia ([place]) 03/31/2015  . Fever 03/31/2015  . Pregnancy 04/29/2014  . Miscarriage   . Incomplete miscarriage 04/02/2011     Class: Acute  . Deep vein thrombosis (DVT) ([place]) 04/02/2011    Class: Chronic   Willow Ora, PTA, [facility] [address] [place], [place] [address] [phone] 06/07/19, 4:32 PM   [name], PT, DPT (addendum created to add recert and extend POC as PT treatment remains medically necessary to improve her functional abilities.  06/08/19 9:41 AM   Name: Ashley [name] MRN: [id number] Date of Birth: Jun 06, 1976

## 2019-06-08 NOTE — Addendum Note (Signed)
Addended by: Debbe Odea on: 06/08/2019 09:42 AM   Modules accepted: Orders

## 2019-06-11 ENCOUNTER — Ambulatory Visit: Payer: Medicare Other | Admitting: Physical Therapy

## 2019-06-11 ENCOUNTER — Other Ambulatory Visit: Payer: Self-pay

## 2019-06-11 DIAGNOSIS — G8929 Other chronic pain: Secondary | ICD-10-CM

## 2019-06-11 DIAGNOSIS — M6281 Muscle weakness (generalized): Secondary | ICD-10-CM | POA: Diagnosis not present

## 2019-06-11 DIAGNOSIS — R2681 Unsteadiness on feet: Secondary | ICD-10-CM

## 2019-06-11 DIAGNOSIS — M545 Low back pain, unspecified: Secondary | ICD-10-CM

## 2019-06-11 NOTE — Therapy (Signed)
[facility] [address] [place], [place], [address] Phone: [phone]   Fax:  [phone]  Physical Therapy Treatment  Patient Details  Name: Ashley [name] MRN: [id number] Date of Birth: December 14, 1975 Referring Provider (PT): Dr. Kristeen Miss    Encounter Date: 06/11/2019  PT End of Session - 06/11/19 2038    Visit Number  33    Number of Visits  36    Date for PT Re-Evaluation  06/11/19    Authorization Type  [employer] Medicare-needs 10th visit progress note    Authorization - Visit Number  23    Authorization - Number of Visits  22    PT Start Time  1510    PT Stop Time  1555    PT Time Calculation (min)  45 min    Equipment Utilized During Treatment  Other (comment)   bar bells, ankle cuff   Activity Tolerance  Patient tolerated treatment well    Behavior During Therapy  [facility] for tasks assessed/performed       Past Medical History:  Diagnosis Date  . Arthritis    knees, back  . 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 Complication of anesthesia   . Depression   . History of pneumonia   . Hypertension   . Lumbar spondylosis   . PCOS (polycystic ovarian syndrome)   . Pneumonia    2016  . PONV (postoperative nausea and vomiting)     Past Surgical History:  Procedure Laterality Date  . ABDOMINAL EXPOSURE N/A 02/17/2018   Procedure: ABDOMINAL EXPOSURE;  Surgeon: Rosetta Posner, MD;  Location: [facility] OR;  Service: Vascular;  Laterality: N/A;  . ANKLE SURGERY     x 2  . ANTERIOR LAT LUMBAR FUSION N/A 02/17/2018   Procedure: Lumbar three-four Lumbar four-five  Anterolateral lumbar interbody fusion;  Surgeon: Kristeen Miss, MD;  Location: [facility];  Service: Neurosurgery;  Laterality: N/A;  . ANTERIOR LUMBAR FUSION N/A 02/17/2018   Procedure: Lumbar five-Sacral one Anterior lumbar interbody fusion;  Surgeon: Kristeen Miss, MD;  Location: [facility];  Service: Neurosurgery;  Laterality: N/A;  . APPLICATION OF ROBOTIC  ASSISTANCE FOR SPINAL PROCEDURE N/A 02/17/2018   Procedure: APPLICATION OF ROBOTIC ASSISTANCE FOR SPINAL PROCEDURE;  Surgeon: Kristeen Miss, MD;  Location: [facility];  Service: Neurosurgery;  Laterality: N/A;  . APPLICATION OF ROBOTIC ASSISTANCE FOR SPINAL PROCEDURE N/A 06/09/2018   Procedure: APPLICATION OF ROBOTIC ASSISTANCE FOR SPINAL PROCEDURE;  Surgeon: Kristeen Miss, MD;  Location: [facility];  Service: Neurosurgery;  Laterality: N/A;  . BACK SURGERY     2017  discectomy  . CARPAL TUNNEL RELEASE Left 07/04/2014   Procedure: LEFT CARPAL TUNNEL RELEASE;  Surgeon: Ninetta Lights, MD;  Location: [facility];  Service: Orthopedics;  Laterality: Left;  . CARPAL TUNNEL RELEASE Right 08/08/2014   Procedure: RIGHT CARPAL TUNNEL RELEASE;  Surgeon: Ninetta Lights, MD;  Location: [facility];  Service: Orthopedics;  Laterality: Right;  . DILATION AND CURETTAGE OF UTERUS    . DILATION AND EVACUATION  04/02/2011   Procedure: DILATATION AND EVACUATION (D&E);  Surgeon: Luz Lex, MD;  Location: [facility];  Service: Gynecology;  Laterality: N/A;  dvt left mid thigh  . DORSAL COMPARTMENT RELEASE Left 07/04/2014   Procedure: LEFT DEQUERVAINS;  Surgeon: Ninetta Lights, MD;  Location: [facility];  Service: Orthopedics;  Laterality: Left;  . ENDOSCOPIC PLANTAR FASCIOTOMY Left 02/01/2002  . HARDWARE REMOVAL Right  06/09/2018   Procedure: Repositioning of Right Lumbar three Right lumbar  four pedicle screw with MAZOR;  Surgeon: Kristeen Miss, MD;  Location: [facility];  Service: Neurosurgery;  Laterality: Right;  . HYSTEROSCOPY W/D&C  07/17/2010   with exc. endometrial polyps  . KNEE ARTHROSCOPY Right 03/07/2003; 01/14/2005; 08/31/2006  . KNEE ARTHROSCOPY Left 03/21/2003; 11/26/2004  . LUMBAR PERCUTANEOUS PEDICLE SCREW 3 LEVEL N/A 02/17/2018   Procedure: LUMBAR PERCUTANEOUS PEDICLE SCREW PLACEMENT LUMBAR THREE-SACRAL ONE;  Surgeon: Kristeen Miss, MD;  Location: [facility];  Service: Neurosurgery;   Laterality: N/A;  . SHOULDER ARTHROSCOPY Right   . TOE SURGERY Left 01/10/2003   claw toe correction 2nd, 3rd, 4th toes  . TOTAL HIP ARTHROPLASTY Right 04/21/2016  . TOTAL HIP ARTHROPLASTY Right 04/21/2016   Procedure: TOTAL HIP ARTHROPLASTY ANTERIOR APPROACH;  Surgeon: Ninetta Lights, MD;  Location: [facility];  Service: Orthopedics;  Laterality: Right;  . TOTAL KNEE ARTHROPLASTY Left 01/21/2010  . TOTAL KNEE ARTHROPLASTY Right 08/26/2008    There were no vitals filed for this visit.  Subjective Assessment - 06/11/19 2035    Subjective  Pt states she was sore after previous aquatic PT session but states it was fine and she was able to work it out - states she feels the pool exs. are helping    Pertinent History  Past Surgical Hx: B TKA (2009, 2011) and R THA 2017. PMH includes; OA, Morbid Obesity, Depression, HTN, Lumbar Spondylosis, Polycystic Ovarian Syndrome, Pneumonia, and PONV    How long can you sit comfortably?  Sitting is uncomfortable. No amount of time specified.     How long can you stand comfortably?  about 5-10 mins    How long can you walk comfortably?  Short period of time with no time period specified    Patient Stated Goals  Reports goal of being more mobile with increased flexibility. Wants to "atleast put lotion on her feet."    Currently in Pain?  Yes    Pain Score  3     Pain Location  Hip    Pain Orientation  Right    Pain Descriptors / Indicators  Aching;Tightness    Pain Type  Chronic pain    Pain Onset  More than a month ago    Pain Frequency  Constant             Aquatic therapy at [facility] - pool temp 87.2 degrees  Patient seen for aquatic therapy today.  Treatment took place in water 3.5-4 feet deep depending upon activity.  Pt entered  and exited the pool via step negotiation - step by step sequence with use of rails with supervision for safety.  Pt performed Runner'ss stretch RLE 30 sec hold x 1 rep;  Rt heel cord stretch in standing with forefoot on wall 30  sec hold  Pt perfomed water walking 50mx 4 reps forward Bar bells used on  Final 2 reps for slight increased resistance  Pt performed sideways amb. 274m 1 rep without UE support; 2590m1 rep sideways with small squat   Pt performed Rt hip flexion, extension and abduction with use of buoyant ankle cuff 15 reps each direction;  15 eps LLE for Same exercises - hip flexion, extension and abduction for RLE isometric contractions and stabilization as well as SLS on RLE - minimal UE support used with LLE exercises to fascilitate improved RLE SLS; pt performed hip flexion/extension with knee flexed at 90 degrees 15 reps each leg  Marching in place 10 reps each leg; progressed to marching forwards/backwards across pool 103mx 1 rep each direction  Pelvic tilts in partial squated/ position against wall  - 10 reps with 3-4 sec hold   Pt performed jogging (slow) 233m 2 reps across pool - tolerated this exercise well with pt instructed to adjust her speed prn comfort  Ai Chi postures - 3 postures - soothing, gathering and enclosing - 10 reps each - rest break between each posture - for LE strengthening and core stabilization  Pt requires aquatic therapy for the buoyancy of the water to allow her to off load her body for spinal decompression and also to reduce the weight of her RLE In the water for decreased pain with AROM; buoyancy of water allows more freedom of movement than can be achieved with land exercise  Pt also requires viscosity of water for strengthening; water allows reduced gait deviaition due to reduced joint loading through buoyancy which facilitates improved  Posture without excess stress & pain                                     PT Short Term Goals - 06/11/19 2046      PT SHORT TERM GOAL #1   Title  Pt will initiate HEP in order to indicate improved functional mobility and decreased fall risk.  (STG DUE 11/13)    Baseline  met 08/07/18    Time  4     Period  Weeks    Status  Achieved      PT SHORT TERM GOAL #2   Title  Pt will improve gait speed to >/= 2.57 ft/sec with LRAD in order to indicate decreased fall risk     Baseline  10/14: 2.27 ft/ sec with no AD to 3.16 ft/sec without AD on 08/07/18    Time  4    Period  Weeks    Status  Achieved      PT SHORT TERM GOAL #3   Title  Pt will perform 5TSS </=17.57 secs with single UE support only in order to indicate decreased fall risk and improved functional strength.      Baseline  10/14: 22.74 from standard height chair with B UE assist to 16.38 secs without UE support.     Time  4    Period  Weeks    Status  Achieved      PT SHORT TERM GOAL #4   Title  Pt will improve BERG balance score to >/=34/56 in order to indicate decreased fall risk.     Baseline  10/14: 30/56 indicative of high fall risk potential to 51/56 on 08/07/18    Time  4    Period  Weeks    Status  Achieved      PT SHORT TERM GOAL #5   Title  Pt will negotiate up/down 8 steps with bilateral rails with supervision in order to indicate improved LE strength    Baseline  met with single rail reciprocal pattern 08/07/18    Time  4    Period  Weeks    Status  Achieved      PT SHORT TERM GOAL #6   Title  Pt will ambulate 300' on uneven surfaces, ramps, curbs, inclines, demonstrating ability to vary gait speed when cued using LRAD and therapist providing supervision to improve safety with community distances  met 08/07/18   Time  4    Period  Weeks    Status  Achieved      PT SHORT TERM GOAL #7   Title  Pt will participate in 6 min walk test to assess baseline endurance with functional mobility     Time  4    Period  Weeks    Status  Achieved        PT Long Term Goals - 06/11/19 2046      PT LONG TERM GOAL #1   Title  Pt will be independent with final HEP in order to indicate improved functional mobilty and decreased fall risk.   (UPDATED LTG DUE 0/35/59 per recent recert)    Baseline  7/41/63: met  with current program    Status  Achieved      PT LONG TERM GOAL #2   Title  Pt will improve gait speed to >/=4.10 ft/sec w/ LRAD in order to indicate decreased fall risk and safe community ambulator.     Baseline  06/06/19: 10.93 sec's= 3.0 ft/sec, decreased from last distance of 3.35 ft/sec on 10/24/18    Status  Not Met      PT LONG TERM GOAL #3   Title  Pt will perform 5TSS in </=13 secs without UE support in order to indicate decreased fall risk and improved functional strength.     Baseline  06/06/19: 11.94 sec's no UE support from standard height surface    Status  Achieved      PT LONG TERM GOAL #4   Title  Pt will improve FGA to >/=25/30 in order to indicate decreased fall risk.     Baseline  06/06/19: scored 23/30 today, decreased from last score of 24/30 on 04/30/19    Status  Not Met      PT LONG TERM GOAL #5   Title  Pt will negotiate up/down 8 steps without rail in reciprocal pattern while carrying item (ie grocery bags) in order to indicate improved safety with home entry/exit.     Baseline  06/06/19: continues to need UE support for controlled descent with reciprocal pattern    Status  Not Met      PT LONG TERM GOAL #6   Title  Pt will ambulate x 500' outdoors/indoors without AD with less antaglic gait pattern in order to indicate improved fluidity of gait and decreased pain.     Baseline  06/06/19: continues to present with antalgic gait pattern    Status  Not Met      PT LONG TERM GOAL #7   Title  Pt will improve 6 min walk test to1589' indicating improvement with functional endurance    10/25: Patient performs 675 with rollator to 818' 08/10/18   Baseline  06/06/19: did not test today due to time contraints, can reassess at next visit to re-establish baseline.    Status  Unable to assess            Plan - 06/11/19 2042    Clinical Impression Statement  Pt tolerated aquatic exercises well with short frequent rest breaks needed due to fatigue of RLE with some c/o mild  discomfort with the hip stretches with use of buoyant ankle cuff which increased ROM due to increased buoyancy and support in the water.  Pt able to perform some activities in the water (fast walking/slow jogging) which she is unable to perform on land.  Bouyancy of the water helped to achieve Rt hip flexion  to approx. 80 degrees in backwards marching.  Pt progressing well with aquatic therapy.    Rehab Potential  Good    Clinical Impairments Affecting Rehab Potential  Pt is motivated to improve. Strength gains may be limited 2/2 to nerve regeneration process     PT Frequency  2x / week    PT Duration  6 weeks    PT Treatment/Interventions  ADLs/Self Care Home Management;Aquatic Therapy;DME Instruction;Gait training;Stair training;Functional mobility training;Therapeutic activities;Therapeutic exercise;Balance training;Neuromuscular re-education;Patient/family education;Electrical Stimulation;Passive range of motion;Energy conservation;Manual techniques;Dry needling;Vestibular    PT Next Visit Plan  primary PT to recert. Pt to continue aquatic therapy as she responded well (aquatic PT is working on schedule). continue to work on LE strengthening, high level balance and descentization for pain management    PT Home Exercise Plan   [id number]     Consulted and Agree with Plan of Care  Patient       Patient will benefit from skilled therapeutic intervention in order to improve the following deficits and impairments:  Abnormal gait, Decreased activity tolerance, Decreased balance, Decreased coordination, Decreased endurance, Decreased mobility, Decreased range of motion, Decreased strength, Impaired perceived functional ability, Impaired flexibility, Impaired sensation, Pain, Decreased knowledge of use of DME, Decreased knowledge of precautions, Obesity  Visit Diagnosis: Muscle weakness (generalized)  Unsteadiness on feet  Chronic midline low back pain without sciatica     Problem List Patient  Active Problem List   Diagnosis Date Noted  . Flat foot 05/07/2019  . Abnormality of gait 02/22/2019  . Lumbosacral radiculopathy at L4 06/09/2018  . Generalized anxiety disorder   . Pain   . Fall   . Labile blood pressure   . Neuropathic pain   . Therapeutic opioid induced constipation   . Urinary retention   . Lumbar radiculopathy 02/21/2018  . PCOS (polycystic ovarian syndrome)   . Morbid obesity ([place])   . Depression   . Generalized OA   . Postoperative pain   . Acute blood loss anemia   . Status post surgery 02/17/2018  . Lumbar radiculopathy, chronic 02/17/2018  . Primary localized osteoarthritis of right hip 04/21/2016  . Sinusitis 06/25/2015  . Cough 06/25/2015  . Essential hypertension 04/03/2015  . Dehydration 04/03/2015  . CAP (community acquired pneumonia) 03/31/2015  . Right lower lobe pneumonia ([place]) 03/31/2015  . Fever 03/31/2015  . Pregnancy 04/29/2014  . Miscarriage   . Incomplete miscarriage 04/02/2011    Class: Acute  . 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

## 2019-06-12 ENCOUNTER — Ambulatory Visit: Payer: Medicare Other | Admitting: Physical Therapy

## 2019-06-15 ENCOUNTER — Ambulatory Visit: Payer: Medicare Other | Admitting: Physical Therapy

## 2019-06-18 ENCOUNTER — Other Ambulatory Visit: Payer: Self-pay

## 2019-06-18 ENCOUNTER — Ambulatory Visit: Payer: Medicare Other | Admitting: Physical Therapy

## 2019-06-18 DIAGNOSIS — R2681 Unsteadiness on feet: Secondary | ICD-10-CM

## 2019-06-18 DIAGNOSIS — M6281 Muscle weakness (generalized): Secondary | ICD-10-CM | POA: Diagnosis not present

## 2019-06-18 DIAGNOSIS — G8929 Other chronic pain: Secondary | ICD-10-CM

## 2019-06-18 DIAGNOSIS — M545 Other chronic pain: Secondary | ICD-10-CM

## 2019-06-18 NOTE — Therapy (Signed)
[facility] [address] [place], [place], [address] Phone: [phone]   Fax:  [phone]  Physical Therapy Treatment  Patient Details  Name: Ashley [name] MRN: [id number] Date of Birth: 10/21/1975 Referring Provider (PT): Dr. Kristeen Miss    Encounter Date: 06/18/2019  PT End of Session - 06/18/19 2108    Visit Number  34    Number of Visits  36    Date for PT Re-Evaluation  06/11/19    Authorization Type  [employer] Medicare-needs 10th visit progress note    Authorization - Visit Number  24    Authorization - Number of Visits  22    PT Start Time  1505    PT Stop Time  1545    PT Time Calculation (min)  40 min    Equipment Utilized During Treatment  Other (comment)   bar bells, ankle cuff   Activity Tolerance  Patient tolerated treatment well    Behavior During Therapy  [facility] for tasks assessed/performed       Past Medical History:  Diagnosis Date  . Arthritis    knees, back  . 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 Complication of anesthesia   . Depression   . History of pneumonia   . Hypertension   . Lumbar spondylosis   . PCOS (polycystic ovarian syndrome)   . Pneumonia    2016  . PONV (postoperative nausea and vomiting)     Past Surgical History:  Procedure Laterality Date  . ABDOMINAL EXPOSURE N/A 02/17/2018   Procedure: ABDOMINAL EXPOSURE;  Surgeon: Rosetta Posner, MD;  Location: [facility] OR;  Service: Vascular;  Laterality: N/A;  . ANKLE SURGERY     x 2  . ANTERIOR LAT LUMBAR FUSION N/A 02/17/2018   Procedure: Lumbar three-four Lumbar four-five  Anterolateral lumbar interbody fusion;  Surgeon: Kristeen Miss, MD;  Location: [place];  Service: Neurosurgery;  Laterality: N/A;  . ANTERIOR LUMBAR FUSION N/A 02/17/2018   Procedure: Lumbar five-Sacral one Anterior lumbar interbody fusion;  Surgeon: Kristeen Miss, MD;  Location: [place];  Service: Neurosurgery;  Laterality: N/A;  . APPLICATION OF ROBOTIC  ASSISTANCE FOR SPINAL PROCEDURE N/A 02/17/2018   Procedure: APPLICATION OF ROBOTIC ASSISTANCE FOR SPINAL PROCEDURE;  Surgeon: Kristeen Miss, MD;  Location: [place];  Service: Neurosurgery;  Laterality: N/A;  . APPLICATION OF ROBOTIC ASSISTANCE FOR SPINAL PROCEDURE N/A 06/09/2018   Procedure: APPLICATION OF ROBOTIC ASSISTANCE FOR SPINAL PROCEDURE;  Surgeon: Kristeen Miss, MD;  Location: [place];  Service: Neurosurgery;  Laterality: N/A;  . BACK SURGERY     2017  discectomy  . CARPAL TUNNEL RELEASE Left 07/04/2014   Procedure: LEFT CARPAL TUNNEL RELEASE;  Surgeon: Ninetta Lights, MD;  Location: [place];  Service: Orthopedics;  Laterality: Left;  . CARPAL TUNNEL RELEASE Right 08/08/2014   Procedure: RIGHT CARPAL TUNNEL RELEASE;  Surgeon: Ninetta Lights, MD;  Location: [place];  Service: Orthopedics;  Laterality: Right;  . DILATION AND CURETTAGE OF UTERUS    . DILATION AND EVACUATION  04/02/2011   Procedure: DILATATION AND EVACUATION (D&E);  Surgeon: Luz Lex, MD;  Location: [facility];  Service: Gynecology;  Laterality: N/A;  dvt left mid thigh  . DORSAL COMPARTMENT RELEASE Left 07/04/2014   Procedure: LEFT DEQUERVAINS;  Surgeon: Ninetta Lights, MD;  Location: [place];  Service: Orthopedics;  Laterality: Left;  . ENDOSCOPIC PLANTAR FASCIOTOMY Left 02/01/2002  . HARDWARE REMOVAL Right  06/09/2018   Procedure: Repositioning of Right Lumbar three Right lumbar  four pedicle screw with MAZOR;  Surgeon: Kristeen Miss, MD;  Location: [place];  Service: Neurosurgery;  Laterality: Right;  . HYSTEROSCOPY W/D&C  07/17/2010   with exc. endometrial polyps  . KNEE ARTHROSCOPY Right 03/07/2003; 01/14/2005; 08/31/2006  . KNEE ARTHROSCOPY Left 03/21/2003; 11/26/2004  . LUMBAR PERCUTANEOUS PEDICLE SCREW 3 LEVEL N/A 02/17/2018   Procedure: LUMBAR PERCUTANEOUS PEDICLE SCREW PLACEMENT LUMBAR THREE-SACRAL ONE;  Surgeon: Kristeen Miss, MD;  Location: [facility];  Service: Neurosurgery;   Laterality: N/A;  . SHOULDER ARTHROSCOPY Right   . TOE SURGERY Left 01/10/2003   claw toe correction 2nd, 3rd, 4th toes  . TOTAL HIP ARTHROPLASTY Right 04/21/2016  . TOTAL HIP ARTHROPLASTY Right 04/21/2016   Procedure: TOTAL HIP ARTHROPLASTY ANTERIOR APPROACH;  Surgeon: Ninetta Lights, MD;  Location: [place];  Service: Orthopedics;  Laterality: Right;  . TOTAL KNEE ARTHROPLASTY Left 01/21/2010  . TOTAL KNEE ARTHROPLASTY Right 08/26/2008    There were no vitals filed for this visit.  Subjective Assessment - 06/18/19 2105    Subjective  Pt states her back is sore today (rates it 3.5/10 intensity); states she overdid it with housecleaning this weekend    Pertinent History  Past Surgical Hx: B TKA (2009, 2011) and R THA 2017. PMH includes; OA, Morbid Obesity, Depression, HTN, Lumbar Spondylosis, Polycystic Ovarian Syndrome, Pneumonia, and PONV    How long can you sit comfortably?  Sitting is uncomfortable. No amount of time specified.     How long can you stand comfortably?  about 5-10 mins    How long can you walk comfortably?  Short period of time with no time period specified    Patient Stated Goals  Reports goal of being more mobile with increased flexibility. Wants to "atleast put lotion on her feet."    Currently in Pain?  Yes    Pain Score  --   3.5 intensity   Pain Location  Back    Pain Orientation  Right    Pain Descriptors / Indicators  Aching;Tightness    Pain Type  Chronic pain    Pain Onset  More than a month ago           Aquatic therapy at [facility] - pool temp 87.2 degrees  Patient seen for aquatic therapy today.  Treatment took place in water 3.5-4 feet deep depending upon activity.  Pt entered  and exited the pool via step negotiation - step by step sequence with use of rails with supervision.  Pt performed Runner's stretch RLE 30 sec hold x 1 rep;  Rt heel cord stretch in standing with forefoot on wall 30 sec hold x 1 rep at beginning and at end of session  Pt perfomed  water walking 52mx 2 reps forward for warm up; added bar bells in each UE on 3 rep for incr. Resistance; backwards amb. 213m 1 rep Pt performed sideways amb. 2546m1 rep without UE support; 34m92m rep sideways with small squat   Pt performed Rt hip flexion, extension and abduction with use of buoyant ankle cuff 15 reps each direction;  15 reps LLE for Same exercises also with use of buoyant ankle cuff - hip flexion, extension and abduction for RLE isometric contractions and stabilization as well as SLS on RLE - minimal UE support used with LLE exercises to fascilitate improved RLE SLS; pt performed hip flexion/extension with knee flexed at 90 degrees 15  reps each leg with use of ankle cuff on each leg with minimal UE support on pool edge  Marching in place 10 reps each leg; progressed to marching forwards/backwards across pool 68mx 1 rep each direction Pt performed forward amb. Approx. 30' across pool with abrupt stop/turn for challenge with dynamic standing balance and to fascilitate balance with turning  Pelvic tilts in partial squated/ position against wall  - 10 reps with 3sec hold   Pt performed jogging (slow) 218m 2 reps across pool - small LE ROM with slower speed for less impact  Ai Chi postures - 3 postures - soothing and enclosing- 10 reps each -  for LE strengthening and core stabilization  Pt requires aquatic therapy for the buoyancy of the water to allow her to off load her body for spinal decompression and also to reduce the weight of her RLE In the water for decreased pain with AROM; buoyancy of water allows more freedom of movement than can be achieved with land exercise  Pt also requires viscosity of water for strengthening; water allows reduced gait deviaition due to reduced joint loading through buoyancy which facilitates improved  posture without excess stress & pain                                   PT Short Term Goals - 06/18/19 2116       PT SHORT TERM GOAL #1   Title  Pt will initiate HEP in order to indicate improved functional mobility and decreased fall risk.  (STG DUE 11/13)    Baseline  met 08/07/18    Time  4    Period  Weeks    Status  Achieved      PT SHORT TERM GOAL #2   Title  Pt will improve gait speed to >/= 2.57 ft/sec with LRAD in order to indicate decreased fall risk     Baseline  10/14: 2.27 ft/ sec with no AD to 3.16 ft/sec without AD on 08/07/18    Time  4    Period  Weeks    Status  Achieved      PT SHORT TERM GOAL #3   Title  Pt will perform 5TSS </=17.57 secs with single UE support only in order to indicate decreased fall risk and improved functional strength.      Baseline  10/14: 22.74 from standard height chair with B UE assist to 16.38 secs without UE support.     Time  4    Period  Weeks    Status  Achieved      PT SHORT TERM GOAL #4   Title  Pt will improve BERG balance score to >/=34/56 in order to indicate decreased fall risk.     Baseline  10/14: 30/56 indicative of high fall risk potential to 51/56 on 08/07/18    Time  4    Period  Weeks    Status  Achieved      PT SHORT TERM GOAL #5   Title  Pt will negotiate up/down 8 steps with bilateral rails with supervision in order to indicate improved LE strength    Baseline  met with single rail reciprocal pattern 08/07/18    Time  4    Period  Weeks    Status  Achieved      PT SHORT TERM GOAL #6   Title  Pt will ambulate 300' on  uneven surfaces, ramps, curbs, inclines, demonstrating ability to vary gait speed when cued using LRAD and therapist providing supervision to improve safety with community distances    met 08/07/18   Time  4    Period  Weeks    Status  Achieved      PT SHORT TERM GOAL #7   Title  Pt will participate in 6 min walk test to assess baseline endurance with functional mobility     Time  4    Period  Weeks    Status  Achieved        PT Long Term Goals - 06/18/19 2116      PT LONG TERM GOAL #1   Title   Pt will be independent with final HEP in order to indicate improved functional mobilty and decreased fall risk.   (UPDATED LTG DUE 02/04/99 per recent recert)    Baseline  1/74/94: met with current program    Status  Achieved      PT LONG TERM GOAL #2   Title  Pt will improve gait speed to >/=4.10 ft/sec w/ LRAD in order to indicate decreased fall risk and safe community ambulator.     Baseline  06/06/19: 10.93 sec's= 3.0 ft/sec, decreased from last distance of 3.35 ft/sec on 10/24/18    Status  Not Met      PT LONG TERM GOAL #3   Title  Pt will perform 5TSS in </=13 secs without UE support in order to indicate decreased fall risk and improved functional strength.     Baseline  06/06/19: 11.94 sec's no UE support from standard height surface    Status  Achieved      PT LONG TERM GOAL #4   Title  Pt will improve FGA to >/=25/30 in order to indicate decreased fall risk.     Baseline  06/06/19: scored 23/30 today, decreased from last score of 24/30 on 04/30/19    Status  Not Met      PT LONG TERM GOAL #5   Title  Pt will negotiate up/down 8 steps without rail in reciprocal pattern while carrying item (ie grocery bags) in order to indicate improved safety with home entry/exit.     Baseline  06/06/19: continues to need UE support for controlled descent with reciprocal pattern    Status  Not Met      PT LONG TERM GOAL #6   Title  Pt will ambulate x 500' outdoors/indoors without AD with less antaglic gait pattern in order to indicate improved fluidity of gait and decreased pain.     Baseline  06/06/19: continues to present with antalgic gait pattern    Status  Not Met      PT LONG TERM GOAL #7   Title  Pt will improve 6 min walk test to1589' indicating improvement with functional endurance    10/25: Patient performs 675 with rollator to 818' 08/10/18   Baseline  06/06/19: did not test today due to time contraints, can reassess at next visit to re-establish baseline.    Status  Unable to assess             Plan - 06/18/19 2109    Clinical Impression Statement  Pt tolerates aquatic exercises well and pushes herself to complete the set of repetitions, even with c/o mild discomfort in Rt hip reported with some of the exercises.  Pt performed pelivc tilts in standing position against pool wall with less c/o pain than she reported with  this exercise last week.  Pt requires the buoyancy of the water for spinal decompression and offloading of Rt hip joint with unweighting for increased activity tolerance with less pain experienced in the water than on land.    Rehab Potential  Good    Clinical Impairments Affecting Rehab Potential  Pt is motivated to improve. Strength gains may be limited 2/2 to nerve regeneration process     PT Frequency  2x / week    PT Duration  6 weeks    PT Treatment/Interventions  ADLs/Self Care Home Management;Aquatic Therapy;DME Instruction;Gait training;Stair training;Functional mobility training;Therapeutic activities;Therapeutic exercise;Balance training;Neuromuscular re-education;Patient/family education;Electrical Stimulation;Passive range of motion;Energy conservation;Manual techniques;Dry needling;Vestibular    PT Next Visit Plan  primary PT to recert. Pt to continue aquatic therapy as she responded well (aquatic PT is working on schedule). continue to work on LE strengthening, high level balance and descentization for pain management    PT Home Exercise Plan   [email or web address]     Consulted and Agree with Plan of Care  Patient       Patient will benefit from skilled therapeutic intervention in order to improve the following deficits and impairments:  Abnormal gait, Decreased activity tolerance, Decreased balance, Decreased coordination, Decreased endurance, Decreased mobility, Decreased range of motion, Decreased strength, Impaired perceived functional ability, Impaired flexibility, Impaired sensation, Pain, Decreased knowledge of use of DME, Decreased knowledge of  precautions, Obesity  Visit Diagnosis: Muscle weakness (generalized)  Chronic midline low back pain without sciatica  Unsteadiness on feet     Problem List Patient Active Problem List   Diagnosis Date Noted  . Flat foot 05/07/2019  . Abnormality of gait 02/22/2019  . Lumbosacral radiculopathy at L4 06/09/2018  . Generalized anxiety disorder   . Pain   . Fall   . Labile blood pressure   . Neuropathic pain   . Therapeutic opioid induced constipation   . Urinary retention   . Lumbar radiculopathy 02/21/2018  . PCOS (polycystic ovarian syndrome)   . Morbid obesity ([place])   . Depression   . Generalized OA   . Postoperative pain   . Acute blood loss anemia   . Status post surgery 02/17/2018  . Lumbar radiculopathy, chronic 02/17/2018  . Primary localized osteoarthritis of right hip 04/21/2016  . Sinusitis 06/25/2015  . Cough 06/25/2015  . Essential hypertension 04/03/2015  . Dehydration 04/03/2015  . CAP (community acquired pneumonia) 03/31/2015  . Right lower lobe pneumonia ([place]) 03/31/2015  . Fever 03/31/2015  . Pregnancy 04/29/2014  . Miscarriage   . Incomplete miscarriage 04/02/2011    Class: Acute  . 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

## 2019-06-19 ENCOUNTER — Ambulatory Visit: Payer: Medicare Other | Admitting: Physical Therapy

## 2019-06-21 ENCOUNTER — Other Ambulatory Visit: Payer: Self-pay

## 2019-06-21 ENCOUNTER — Ambulatory Visit: Payer: Medicare Other | Attending: Neurological Surgery | Admitting: Physical Therapy

## 2019-06-21 ENCOUNTER — Encounter: Payer: Self-pay | Admitting: Physical Therapy

## 2019-06-21 DIAGNOSIS — M6281 Muscle weakness (generalized): Secondary | ICD-10-CM | POA: Insufficient documentation

## 2019-06-21 DIAGNOSIS — R2681 Unsteadiness on feet: Secondary | ICD-10-CM | POA: Diagnosis present

## 2019-06-21 DIAGNOSIS — M545 Low back pain: Secondary | ICD-10-CM | POA: Insufficient documentation

## 2019-06-21 DIAGNOSIS — G8929 Other chronic pain: Secondary | ICD-10-CM | POA: Insufficient documentation

## 2019-06-21 DIAGNOSIS — M5416 Radiculopathy, lumbar region: Secondary | ICD-10-CM | POA: Insufficient documentation

## 2019-06-21 NOTE — Patient Instructions (Addendum)
Access Code: [email or web address]  URL: [email or web address]  Date: 06/21/2019  Prepared by: Misty Stanley   Exercises Supine Hamstring Stretch - 10 reps - 1 sets - 15-20 second hold - 2-3x daily - 7x weekly Prone Quadriceps Stretch with Strap - 3 reps - 20-30 second hold - 2-3x daily - 7x weekly Prone Knee Flexion AROM - 10 reps - 2-3 second hold - 2-3x daily - 7x weekly

## 2019-06-21 NOTE — Therapy (Signed)
[facility] [address] [place], [place], [address] Phone: [phone]   Fax:  [phone]  Physical Therapy Treatment  Patient Details  Name: Ashley [name] MRN: [id number] Date of Birth: 07/19/1976 Referring Provider (PT): Dr. Kristeen Miss    Encounter Date: 06/21/2019  PT End of Session - 06/21/19 1125    Visit Number  35    Number of Visits  41    Date for PT Re-Evaluation  07/06/19    Authorization Type  [employer] Medicare-needs 10th visit progress note    Authorization - Visit Number  --    Authorization - Number of Visits  --    PT Start Time  [phone]   arrived late   PT Stop Time  1017    PT Time Calculation (min)  35 min    Equipment Utilized During Treatment  --    Activity Tolerance  Patient tolerated treatment well    Behavior During Therapy  [facility] for tasks assessed/performed       Past Medical History:  Diagnosis Date  . Arthritis    knees, back  . 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 Complication of anesthesia   . Depression   . History of pneumonia   . Hypertension   . Lumbar spondylosis   . PCOS (polycystic ovarian syndrome)   . Pneumonia    2016  . PONV (postoperative nausea and vomiting)     Past Surgical History:  Procedure Laterality Date  . ABDOMINAL EXPOSURE N/A 02/17/2018   Procedure: ABDOMINAL EXPOSURE;  Surgeon: Rosetta Posner, MD;  Location: [facility] OR;  Service: Vascular;  Laterality: N/A;  . ANKLE SURGERY     x 2  . ANTERIOR LAT LUMBAR FUSION N/A 02/17/2018   Procedure: Lumbar three-four Lumbar four-five  Anterolateral lumbar interbody fusion;  Surgeon: Kristeen Miss, MD;  Location: [facility];  Service: Neurosurgery;  Laterality: N/A;  . ANTERIOR LUMBAR FUSION N/A 02/17/2018   Procedure: Lumbar five-Sacral one Anterior lumbar interbody fusion;  Surgeon: Kristeen Miss, MD;  Location: [facility];  Service: Neurosurgery;  Laterality: N/A;  . APPLICATION OF ROBOTIC ASSISTANCE FOR SPINAL  PROCEDURE N/A 02/17/2018   Procedure: APPLICATION OF ROBOTIC ASSISTANCE FOR SPINAL PROCEDURE;  Surgeon: Kristeen Miss, MD;  Location: [facility];  Service: Neurosurgery;  Laterality: N/A;  . APPLICATION OF ROBOTIC ASSISTANCE FOR SPINAL PROCEDURE N/A 06/09/2018   Procedure: APPLICATION OF ROBOTIC ASSISTANCE FOR SPINAL PROCEDURE;  Surgeon: Kristeen Miss, MD;  Location: [facility];  Service: Neurosurgery;  Laterality: N/A;  . BACK SURGERY     2017  discectomy  . CARPAL TUNNEL RELEASE Left 07/04/2014   Procedure: LEFT CARPAL TUNNEL RELEASE;  Surgeon: Ninetta Lights, MD;  Location: [facility];  Service: Orthopedics;  Laterality: Left;  . CARPAL TUNNEL RELEASE Right 08/08/2014   Procedure: RIGHT CARPAL TUNNEL RELEASE;  Surgeon: Ninetta Lights, MD;  Location: [facility];  Service: Orthopedics;  Laterality: Right;  . DILATION AND CURETTAGE OF UTERUS    . DILATION AND EVACUATION  04/02/2011   Procedure: DILATATION AND EVACUATION (D&E);  Surgeon: Luz Lex, MD;  Location: [facility];  Service: Gynecology;  Laterality: N/A;  dvt left mid thigh  . DORSAL COMPARTMENT RELEASE Left 07/04/2014   Procedure: LEFT DEQUERVAINS;  Surgeon: Ninetta Lights, MD;  Location: [facility];  Service: Orthopedics;  Laterality: Left;  . ENDOSCOPIC PLANTAR FASCIOTOMY Left 02/01/2002  . HARDWARE REMOVAL Right 06/09/2018  Procedure: Repositioning of Right Lumbar three Right lumbar  four pedicle screw with MAZOR;  Surgeon: Kristeen Miss, MD;  Location: [facility];  Service: Neurosurgery;  Laterality: Right;  . HYSTEROSCOPY W/D&C  07/17/2010   with exc. endometrial polyps  . KNEE ARTHROSCOPY Right 03/07/2003; 01/14/2005; 08/31/2006  . KNEE ARTHROSCOPY Left 03/21/2003; 11/26/2004  . LUMBAR PERCUTANEOUS PEDICLE SCREW 3 LEVEL N/A 02/17/2018   Procedure: LUMBAR PERCUTANEOUS PEDICLE SCREW PLACEMENT LUMBAR THREE-SACRAL ONE;  Surgeon: Kristeen Miss, MD;  Location: [facility];  Service: Neurosurgery;  Laterality: N/A;  .  SHOULDER ARTHROSCOPY Right   . TOE SURGERY Left 01/10/2003   claw toe correction 2nd, 3rd, 4th toes  . TOTAL HIP ARTHROPLASTY Right 04/21/2016  . TOTAL HIP ARTHROPLASTY Right 04/21/2016   Procedure: TOTAL HIP ARTHROPLASTY ANTERIOR APPROACH;  Surgeon: Ninetta Lights, MD;  Location: [facility];  Service: Orthopedics;  Laterality: Right;  . TOTAL KNEE ARTHROPLASTY Left 01/21/2010  . TOTAL KNEE ARTHROPLASTY Right 08/26/2008    There were no vitals filed for this visit.  Subjective Assessment - 06/21/19 0944    Subjective  Feels like the biggest things she still needs to work on is balance and picking the R foot up - continues to catch on the floor.  Caught in the grocery store this morning.  Back soreness has been getting worse - is back to using pain patches on her back.    Pertinent History  Past Surgical Hx: B TKA (2009, 2011) and R THA 2017. PMH includes; OA, Morbid Obesity, Depression, HTN, Lumbar Spondylosis, Polycystic Ovarian Syndrome, Pneumonia, and PONV    How long can you sit comfortably?  Sitting is uncomfortable. No amount of time specified.     How long can you stand comfortably?  about 5-10 mins    How long can you walk comfortably?  Short period of time with no time period specified    Patient Stated Goals  Reports goal of being more mobile with increased flexibility. Wants to "atleast put lotion on her feet."    Currently in Pain?  Yes    Pain Score  4     Pain Location  Back    Pain Orientation  Lower    Pain Descriptors / Indicators  Discomfort    Pain Type  Chronic pain    Pain Onset  More than a month ago                       [facility] Adult PT Treatment/Exercise - 06/21/19 0947      Knee/Hip Exercises: Stretches   Active Hamstring Stretch  Right;2 reps;30 seconds    Active Hamstring Stretch Limitations  Belt used to help flex hip and then had patient extend knee and DF/PF ankle x 10    Hip Flexor Stretch  Right;30 seconds    Hip Flexor Stretch Limitations   Cramping in hamstring . Repeated after active hamstring stretch x 30 sec. Active hip flexor stretch prone on elbows with right hamstring curl x 10 for neural glides with tactile cues at right pelvis to keep down.       Manual Therapy   Other Manual Therapy  Negativie left slump test, Pain with right slump test right groin but no change with letting off DF and cervical flexion.. Tender to palpation at right lateral femoral cutaneous nerve.Sidelying slump test to bias lateral cutaneous and femoral nerve. Pain on right but not same pain patient usually reports seeming more muscle tightness related.  Access Code: [email or web address]  URL: [email or web address]  Date: 06/21/2019  Prepared by: Misty Stanley   Exercises Supine Hamstring Stretch - 10 reps - 1 sets - 15-20 second hold - 2-3x daily - 7x weekly Prone Quadriceps Stretch with Strap - 3 reps - 20-30 second hold - 2-3x daily - 7x weekly Prone Knee Flexion AROM with chin tuck - 10 reps - 2-3 second hold - 2-3x daily - 7x weekly   PT Education - 06/21/19 1124    Education Details  discussed how nerves require space, movement and blood and how following surgery scar tissue can limit space creating hypersensitivity and purpose of nerve sliders and tensioners; re-initiated HEP focusing on ROM and nerve tensioners    Person(s) Educated  Patient    Methods  Explanation;Demonstration;Handout    Comprehension  Verbalized understanding;Returned demonstration       PT Short Term Goals - 06/21/19 1128      PT SHORT TERM GOAL #1   Title  = LTG        PT Long Term Goals - 06/21/19 1128      PT LONG TERM GOAL #1   Title  Pt will be independent with final HEP in order to indicate improved functional mobilty and decreased fall risk.    Time  --    Period  --    Status  Revised    Target Date  07/06/19      PT LONG TERM GOAL #2   Title  Pt will improve gait speed to >/=4.10 ft/sec in order to indicate decreased fall risk and safe  community ambulator.    Baseline  06/06/19: 10.93 sec's= 3.0 ft/sec, decreased from last distance of 3.35 ft/sec on 10/24/18    Time  --    Period  --    Status  Revised    Target Date  07/06/19      PT LONG TERM GOAL #4   Title  Pt will improve FGA to >/=25/30 in order to indicate decreased fall risk.     Baseline  06/06/19: scored 23/30 today, decreased from last score of 24/30 on 04/30/19    Time  --    Period  --    Status  Revised    Target Date  07/06/19      PT LONG TERM GOAL #5   Title  Pt will negotiate up/down 8 steps without rail in reciprocal pattern while carrying item (ie grocery bags) in order to indicate improved safety with home entry/exit.     Baseline  06/06/19: continues to need UE support for controlled descent with reciprocal pattern    Time  --    Period  --    Status  Revised    Target Date  07/06/19      PT LONG TERM GOAL #6   Title  Pt will ambulate x 500' outdoors/indoors without AD with less antaglic gait pattern in order to indicate improved fluidity of gait and decreased pain.     Baseline  06/06/19: continues to present with antalgic gait pattern    Status  Revised      PT LONG TERM GOAL #7   Title  Pt will improve 6 min walk test to1589' indicating improvement with functional endurance    10/25: Patient performs 675 with rollator to 818' 08/10/18   Baseline  06/06/19: did not test today due to time contraints, can reassess at next visit to re-establish baseline.    Status  Revised  Target Date  07/06/19            Plan - 06/21/19 1130    Clinical Impression Statement  Pt continues to report weakness and difficulty lifting RLE to clear foot and intermittent catching her foot on floor or on obstacles.  Pt also continues to report discomfort and numbness on proximal and lateral thigh.  Performed L and then R palpation of lateral femoral cutaneous nerve, slump test to assess mobility of full nervous system and sidelying slump test to assess the  mobility of the lateral femoral cutaneous nerve.  Localized restriction in knee extension during slump test without change in symptoms with neck movement and during sidelying slump test pt felt tightness in anterior thigh but not in normal location of discomfort - did not change with neck movement.  Pt very sensitive to nerve palpation.  Initiated stretching of hamstring and hip flexor muscles and initiate nerve sliders in prone - may also benefit from neural massage and nerve sliders for cutaneous nerve and in slump position.  Will continue to address to improve neural mobility in order to improve ROM, strength and safety during gait.    Rehab Potential  Good    Clinical Impairments Affecting Rehab Potential  Pt is motivated to improve. Strength gains may be limited 2/2 to nerve regeneration process     PT Frequency  2x / week    PT Duration  6 weeks    PT Treatment/Interventions  ADLs/Self Care Home Management;Aquatic Therapy;DME Instruction;Gait training;Stair training;Functional mobility training;Therapeutic activities;Therapeutic exercise;Balance training;Neuromuscular re-education;Patient/family education;Electrical Stimulation;Passive range of motion;Energy conservation;Manual techniques;Dry needling;Vestibular    PT Next Visit Plan  Slump nerve sliders, sidelying nerve slider/tensioner.  Focus on hip flexion strength and clearing obstacles during gait.  Stair negotiation.  Endurance/aerobic conditioning activities.  Pt to continue aquatic therapy as she responded well (aquatic PT is working on schedule).    PT Home Exercise Plan   [id number]     Consulted and Agree with Plan of Care  Patient       Patient will benefit from skilled therapeutic intervention in order to improve the following deficits and impairments:  Abnormal gait, Decreased activity tolerance, Decreased balance, Decreased coordination, Decreased endurance, Decreased mobility, Decreased range of motion, Decreased strength, Impaired  perceived functional ability, Impaired flexibility, Impaired sensation, Pain, Decreased knowledge of use of DME, Decreased knowledge of precautions, Obesity  Visit Diagnosis: Muscle weakness (generalized)  Chronic midline low back pain without sciatica  Unsteadiness on feet  Radiculopathy, lumbar region     Problem List Patient Active Problem List   Diagnosis Date Noted  . Flat foot 05/07/2019  . Abnormality of gait 02/22/2019  . Lumbosacral radiculopathy at L4 06/09/2018  . Generalized anxiety disorder   . Pain   . Fall   . Labile blood pressure   . Neuropathic pain   . Therapeutic opioid induced constipation   . Urinary retention   . Lumbar radiculopathy 02/21/2018  . PCOS (polycystic ovarian syndrome)   . Morbid obesity ([place])   . Depression   . Generalized OA   . Postoperative pain   . Acute blood loss anemia   . Status post surgery 02/17/2018  . Lumbar radiculopathy, chronic 02/17/2018  . Primary localized osteoarthritis of right hip 04/21/2016  . Sinusitis 06/25/2015  . Cough 06/25/2015  . Essential hypertension 04/03/2015  . Dehydration 04/03/2015  . CAP (community acquired pneumonia) 03/31/2015  . Right lower lobe pneumonia 03/31/2015  . Fever 03/31/2015  . Pregnancy 04/29/2014  .  Miscarriage   . Incomplete miscarriage 04/02/2011    Class: Acute  . 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 [place], [place], [address] Phone: [phone]   Fax:  [phone]  Name: Ashley [name] MRN: [id number] Date of Birth: 11-09-1975

## 2019-06-25 ENCOUNTER — Ambulatory Visit: Payer: Medicare Other | Admitting: Physical Therapy

## 2019-06-27 ENCOUNTER — Other Ambulatory Visit: Payer: Self-pay

## 2019-06-27 ENCOUNTER — Ambulatory Visit: Payer: Medicare Other | Admitting: Physical Therapy

## 2019-06-27 ENCOUNTER — Encounter: Payer: Self-pay | Admitting: Physical Therapy

## 2019-06-27 DIAGNOSIS — G8929 Other chronic pain: Secondary | ICD-10-CM

## 2019-06-27 DIAGNOSIS — M6281 Muscle weakness (generalized): Secondary | ICD-10-CM | POA: Diagnosis not present

## 2019-06-27 DIAGNOSIS — R2681 Unsteadiness on feet: Secondary | ICD-10-CM

## 2019-06-27 DIAGNOSIS — M545 Low back pain, unspecified: Secondary | ICD-10-CM

## 2019-06-28 NOTE — Therapy (Signed)
[facility] [address] [place], [place], [address] Phone: [phone]   Fax:  [phone]  Physical Therapy Treatment  Patient Details  Name: Ashley [name] MRN: [id number] Date of Birth: Dec 19, 1975 Referring Provider (PT): Dr. Kristeen Miss    Encounter Date: 06/27/2019  PT End of Session - 06/27/19 0940    Visit Number  36    Number of Visits  41    Date for PT Re-Evaluation  07/06/19    Authorization Type  [employer] Medicare-needs 10th visit progress note    PT Start Time  0932    PT Stop Time  1015    PT Time Calculation (min)  43 min    Activity Tolerance  Patient tolerated treatment well;No increased pain    Behavior During Therapy  WFL for tasks assessed/performed       Past Medical History:  Diagnosis Date  . Arthritis    knees, back  . 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 Complication of anesthesia   . Depression   . History of pneumonia   . Hypertension   . Lumbar spondylosis   . PCOS (polycystic ovarian syndrome)   . Pneumonia    2016  . PONV (postoperative nausea and vomiting)     Past Surgical History:  Procedure Laterality Date  . ABDOMINAL EXPOSURE N/A 02/17/2018   Procedure: ABDOMINAL EXPOSURE;  Surgeon: Rosetta Posner, MD;  Location: [facility];  Service: Vascular;  Laterality: N/A;  . ANKLE SURGERY     x 2  . ANTERIOR LAT LUMBAR FUSION N/A 02/17/2018   Procedure: Lumbar three-four Lumbar four-five  Anterolateral lumbar interbody fusion;  Surgeon: Kristeen Miss, MD;  Location: [facility];  Service: Neurosurgery;  Laterality: N/A;  . ANTERIOR LUMBAR FUSION N/A 02/17/2018   Procedure: Lumbar five-Sacral one Anterior lumbar interbody fusion;  Surgeon: Kristeen Miss, MD;  Location: [facility];  Service: Neurosurgery;  Laterality: N/A;  . APPLICATION OF ROBOTIC ASSISTANCE FOR SPINAL PROCEDURE N/A 02/17/2018   Procedure: APPLICATION OF ROBOTIC ASSISTANCE FOR SPINAL PROCEDURE;  Surgeon: Kristeen Miss, MD;   Location: [facility];  Service: Neurosurgery;  Laterality: N/A;  . APPLICATION OF ROBOTIC ASSISTANCE FOR SPINAL PROCEDURE N/A 06/09/2018   Procedure: APPLICATION OF ROBOTIC ASSISTANCE FOR SPINAL PROCEDURE;  Surgeon: Kristeen Miss, MD;  Location: [facility];  Service: Neurosurgery;  Laterality: N/A;  . BACK SURGERY     2017  discectomy  . CARPAL TUNNEL RELEASE Left 07/04/2014   Procedure: LEFT CARPAL TUNNEL RELEASE;  Surgeon: Ninetta Lights, MD;  Location: [facility];  Service: Orthopedics;  Laterality: Left;  . CARPAL TUNNEL RELEASE Right 08/08/2014   Procedure: RIGHT CARPAL TUNNEL RELEASE;  Surgeon: Ninetta Lights, MD;  Location: [facility];  Service: Orthopedics;  Laterality: Right;  . DILATION AND CURETTAGE OF UTERUS    . DILATION AND EVACUATION  04/02/2011   Procedure: DILATATION AND EVACUATION (D&E);  Surgeon: Luz Lex, MD;  Location: [facility];  Service: Gynecology;  Laterality: N/A;  dvt left mid thigh  . DORSAL COMPARTMENT RELEASE Left 07/04/2014   Procedure: LEFT DEQUERVAINS;  Surgeon: Ninetta Lights, MD;  Location: [facility];  Service: Orthopedics;  Laterality: Left;  . ENDOSCOPIC PLANTAR FASCIOTOMY Left 02/01/2002  . HARDWARE REMOVAL Right 06/09/2018   Procedure: Repositioning of Right Lumbar three Right lumbar  four pedicle screw with MAZOR;  Surgeon: Kristeen Miss, MD;  Location: [facility];  Service: Neurosurgery;  Laterality: Right;  .  HYSTEROSCOPY W/D&C  07/17/2010   with exc. endometrial polyps  . KNEE ARTHROSCOPY Right 03/07/2003; 01/14/2005; 08/31/2006  . KNEE ARTHROSCOPY Left 03/21/2003; 11/26/2004  . LUMBAR PERCUTANEOUS PEDICLE SCREW 3 LEVEL N/A 02/17/2018   Procedure: LUMBAR PERCUTANEOUS PEDICLE SCREW PLACEMENT LUMBAR THREE-SACRAL ONE;  Surgeon: Kristeen Miss, MD;  Location: [facility];  Service: Neurosurgery;  Laterality: N/A;  . SHOULDER ARTHROSCOPY Right   . TOE SURGERY Left 01/10/2003   claw toe correction 2nd, 3rd, 4th toes  . TOTAL HIP  ARTHROPLASTY Right 04/21/2016  . TOTAL HIP ARTHROPLASTY Right 04/21/2016   Procedure: TOTAL HIP ARTHROPLASTY ANTERIOR APPROACH;  Surgeon: Ninetta Lights, MD;  Location: [facility];  Service: Orthopedics;  Laterality: Right;  . TOTAL KNEE ARTHROPLASTY Left 01/21/2010  . TOTAL KNEE ARTHROPLASTY Right 08/26/2008    There were no vitals filed for this visit.  Subjective Assessment - 06/27/19 0936    Subjective  No falls. Does report her right thigh> left thigh is tight still. Doing the stretches at home. Does report cramping at night.    Pertinent History  Past Surgical Hx: B TKA (2009, 2011) and R THA 2017. PMH includes; OA, Morbid Obesity, Depression, HTN, Lumbar Spondylosis, Polycystic Ovarian Syndrome, Pneumonia, and PONV    Limitations  Standing;Walking;Lifting;Sitting    How long can you sit comfortably?  Sitting is uncomfortable. No amount of time specified.     How long can you stand comfortably?  about 5-10 mins    How long can you walk comfortably?  Short period of time with no time period specified    Patient Stated Goals  Reports goal of being more mobile with increased flexibility. Wants to "atleast put lotion on her feet."    Currently in Pain?  Yes    Pain Score  3     Pain Location  Back    Pain Descriptors / Indicators  Aching;Discomfort    Pain Type  Chronic pain    Pain Onset  More than a month ago    Aggravating Factors   weather, increased activity    Pain Score  3    Pain Location  Groin    Pain Orientation  Right;Anterior    Pain Descriptors / Indicators  Tightness;Aching;Constant    Pain Type  Chronic pain    Pain Onset  More than a month ago    Pain Frequency  Constant    Aggravating Factors   increased activity, some exercises    Pain Relieving Factors  stretches (although sometimes this increases the pain "like a tearing feeling"            [facility] Adult PT Treatment/Exercise - 06/27/19 [id number]      Neuro Re-ed    Neuro Re-ed Details   for balance/strengthening:  right stance on 1 inch foam with heel taps to color tiles in semi circle pattern, 4 laps with min assist for balance.       Knee/Hip Exercises: Stretches   Active Hamstring Stretch  Right;3 reps;30 seconds;Limitations    Active Hamstring Stretch Limitations  seated at edge of mat: cues on form and hold times    Other Knee/Hip Stretches  reviewed with pt performing stretches issued at last session. added nerve glide to spine HS stretch this session as well. cues on form and techinque wtih all 3 stretches with pt performing 7-8 reps of each.       Knee/Hip Exercises: Aerobic   Other Aerobic  Scifit LE/UE's  for 6 minutes level 6.5 with  goal >/=55 rpm for strengthening/activity tolerance.       Knee/Hip Exercises: Machines for Strengthening   Cybex Leg Press  bil LE's 80#'s with 5 sec hold in extension for 10 reps, then right leg only 50#'s with 5 sec hold for 10 reps.       Knee/Hip Exercises: Standing   Other Standing Knee Exercises  standing at bottom of steps with left UE support only; right foot taps up/down bottom 3 stairs for 10 reps with light to no UE support. Cues for hip/knee flexion and to not slide foot off step edge.           PT Short Term Goals - 06/21/19 1128      PT SHORT TERM GOAL #1   Title  = LTG        PT Long Term Goals - 06/21/19 1128      PT LONG TERM GOAL #1   Title  Pt will be independent with final HEP in order to indicate improved functional mobilty and decreased fall risk.    Time  --    Period  --    Status  Revised    Target Date  07/06/19      PT LONG TERM GOAL #2   Title  Pt will improve gait speed to >/=4.10 ft/sec in order to indicate decreased fall risk and safe community ambulator.    Baseline  06/06/19: 10.93 sec's= 3.0 ft/sec, decreased from last distance of 3.35 ft/sec on 10/24/18    Time  --    Period  --    Status  Revised    Target Date  07/06/19      PT LONG TERM GOAL #4   Title  Pt will improve FGA to >/=25/30 in order to  indicate decreased fall risk.     Baseline  06/06/19: scored 23/30 today, decreased from last score of 24/30 on 04/30/19    Time  --    Period  --    Status  Revised    Target Date  07/06/19      PT LONG TERM GOAL #5   Title  Pt will negotiate up/down 8 steps without rail in reciprocal pattern while carrying item (ie grocery bags) in order to indicate improved safety with home entry/exit.     Baseline  06/06/19: continues to need UE support for controlled descent with reciprocal pattern    Time  --    Period  --    Status  Revised    Target Date  07/06/19      PT LONG TERM GOAL #6   Title  Pt will ambulate x 500' outdoors/indoors without AD with less antaglic gait pattern in order to indicate improved fluidity of gait and decreased pain.     Baseline  06/06/19: continues to present with antalgic gait pattern    Status  Revised      PT LONG TERM GOAL #7   Title  Pt will improve 6 min walk test to1589' indicating improvement with functional endurance    10/25: Patient performs 675 with rollator to 818' 08/10/18   Baseline  06/06/19: did not test today due to time contraints, can reassess at next visit to re-establish baseline.    Status  Revised    Target Date  07/06/19            Plan - 06/27/19 0940    Clinical Impression Statement  Today's skilled session continued to focus on LE strengthening, balance and  stretching of LE's for pain management. No change in pain reported with session/activities. Will continue to address these issues in sessions, also to be addressed in aquatic sessions.    Rehab Potential  Good    Clinical Impairments Affecting Rehab Potential  Pt is motivated to improve. Strength gains may be limited 2/2 to nerve regeneration process     PT Frequency  2x / week    PT Duration  6 weeks    PT Treatment/Interventions  ADLs/Self Care Home Management;Aquatic Therapy;DME Instruction;Gait training;Stair training;Functional mobility training;Therapeutic  activities;Therapeutic exercise;Balance training;Neuromuscular re-education;Patient/family education;Electrical Stimulation;Passive range of motion;Energy conservation;Manual techniques;Dry needling;Vestibular    PT Next Visit Plan  LTGs due 07/06/19, pt stated she may want to decrease to 1x a week due to copays starting back up.    PT Home Exercise Plan   [id number]     Consulted and Agree with Plan of Care  Patient       Patient will benefit from skilled therapeutic intervention in order to improve the following deficits and impairments:  Abnormal gait, Decreased activity tolerance, Decreased balance, Decreased coordination, Decreased endurance, Decreased mobility, Decreased range of motion, Decreased strength, Impaired perceived functional ability, Impaired flexibility, Impaired sensation, Pain, Decreased knowledge of use of DME, Decreased knowledge of precautions, Obesity  Visit Diagnosis: Muscle weakness (generalized)  Chronic midline low back pain without sciatica  Unsteadiness on feet     Problem List Patient Active Problem List   Diagnosis Date Noted  . Flat foot 05/07/2019  . Abnormality of gait 02/22/2019  . Lumbosacral radiculopathy at L4 06/09/2018  . Generalized anxiety disorder   . Pain   . Fall   . Labile blood pressure   . Neuropathic pain   . Therapeutic opioid induced constipation   . Urinary retention   . Lumbar radiculopathy 02/21/2018  . PCOS (polycystic ovarian syndrome)   . Morbid obesity ([place])   . Depression   . Generalized OA   . Postoperative pain   . Acute blood loss anemia   . Status post surgery 02/17/2018  . Lumbar radiculopathy, chronic 02/17/2018  . Primary localized osteoarthritis of right hip 04/21/2016  . Sinusitis 06/25/2015  . Cough 06/25/2015  . Essential hypertension 04/03/2015  . Dehydration 04/03/2015  . CAP (community acquired pneumonia) 03/31/2015  . Right lower lobe pneumonia 03/31/2015  . Fever 03/31/2015  . Pregnancy  04/29/2014  . Miscarriage   . Incomplete miscarriage 04/02/2011    Class: Acute  . Deep vein thrombosis (DVT) (HCC) 04/02/2011    Class: Chronic    Willow Ora, PTA, [facility] [address], [place], [place] [address] [phone] 06/28/19, 3:57 PM   Name: Ashley [name] MRN: [id number] Date of Birth: Jun 16, 1976

## 2019-07-02 ENCOUNTER — Ambulatory Visit: Payer: Medicare Other | Admitting: Physical Therapy

## 2019-07-02 ENCOUNTER — Other Ambulatory Visit: Payer: Self-pay

## 2019-07-02 DIAGNOSIS — M6281 Muscle weakness (generalized): Secondary | ICD-10-CM | POA: Diagnosis not present

## 2019-07-02 DIAGNOSIS — G8929 Other chronic pain: Secondary | ICD-10-CM

## 2019-07-02 DIAGNOSIS — M545 Low back pain, unspecified: Secondary | ICD-10-CM

## 2019-07-02 DIAGNOSIS — R2681 Unsteadiness on feet: Secondary | ICD-10-CM

## 2019-07-02 DIAGNOSIS — M5416 Radiculopathy, lumbar region: Secondary | ICD-10-CM

## 2019-07-02 NOTE — Therapy (Signed)
[facility] [address] [place], [place], [address] Phone: [phone]   Fax:  [phone]  Physical Therapy Treatment  Patient Details  Name: Ashley [name] MRN: [id number] Date of Birth: June 21, 1976 Referring Provider (PT): Dr. Kristeen Miss    Encounter Date: 07/02/2019  PT End of Session - 07/02/19 1119    Visit Number  37    Number of Visits  42    Date for PT Re-Evaluation  08/06/19    Authorization Type  [employer] Medicare-needs 10th visit progress note    PT Start Time  1024   pt arrived late; had to end early due to elevated BP   PT Stop Time  1100    PT Time Calculation (min)  36 min    Activity Tolerance  Patient tolerated treatment well;Treatment limited secondary to medical complications (Comment)   elevated BP; unable to perform 6 minute walk   Behavior During Therapy  [facility] for tasks assessed/performed       Past Medical History:  Diagnosis Date  . Arthritis    knees, back  . 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 Complication of anesthesia   . Depression   . History of pneumonia   . Hypertension   . Lumbar spondylosis   . PCOS (polycystic ovarian syndrome)   . Pneumonia    2016  . PONV (postoperative nausea and vomiting)     Past Surgical History:  Procedure Laterality Date  . ABDOMINAL EXPOSURE N/A 02/17/2018   Procedure: ABDOMINAL EXPOSURE;  Surgeon: Rosetta Posner, MD;  Location: [facility] OR;  Service: Vascular;  Laterality: N/A;  . ANKLE SURGERY     x 2  . ANTERIOR LAT LUMBAR FUSION N/A 02/17/2018   Procedure: Lumbar three-four Lumbar four-five  Anterolateral lumbar interbody fusion;  Surgeon: Kristeen Miss, MD;  Location: [facility];  Service: Neurosurgery;  Laterality: N/A;  . ANTERIOR LUMBAR FUSION N/A 02/17/2018   Procedure: Lumbar five-Sacral one Anterior lumbar interbody fusion;  Surgeon: Kristeen Miss, MD;  Location: [facility];  Service: Neurosurgery;  Laterality: N/A;  . APPLICATION OF  ROBOTIC ASSISTANCE FOR SPINAL PROCEDURE N/A 02/17/2018   Procedure: APPLICATION OF ROBOTIC ASSISTANCE FOR SPINAL PROCEDURE;  Surgeon: Kristeen Miss, MD;  Location: [facility];  Service: Neurosurgery;  Laterality: N/A;  . APPLICATION OF ROBOTIC ASSISTANCE FOR SPINAL PROCEDURE N/A 06/09/2018   Procedure: APPLICATION OF ROBOTIC ASSISTANCE FOR SPINAL PROCEDURE;  Surgeon: Kristeen Miss, MD;  Location: [facility];  Service: Neurosurgery;  Laterality: N/A;  . BACK SURGERY     2017  discectomy  . CARPAL TUNNEL RELEASE Left 07/04/2014   Procedure: LEFT CARPAL TUNNEL RELEASE;  Surgeon: Ninetta Lights, MD;  Location: [facility];  Service: Orthopedics;  Laterality: Left;  . CARPAL TUNNEL RELEASE Right 08/08/2014   Procedure: RIGHT CARPAL TUNNEL RELEASE;  Surgeon: Ninetta Lights, MD;  Location: [facility];  Service: Orthopedics;  Laterality: Right;  . DILATION AND CURETTAGE OF UTERUS    . DILATION AND EVACUATION  04/02/2011   Procedure: DILATATION AND EVACUATION (D&E);  Surgeon: Luz Lex, MD;  Location: [facility];  Service: Gynecology;  Laterality: N/A;  dvt left mid thigh  . DORSAL COMPARTMENT RELEASE Left 07/04/2014   Procedure: LEFT DEQUERVAINS;  Surgeon: Ninetta Lights, MD;  Location: [facility];  Service: Orthopedics;  Laterality: Left;  . ENDOSCOPIC PLANTAR FASCIOTOMY Left 02/01/2002  . HARDWARE REMOVAL Right 06/09/2018   Procedure: Repositioning of Right  Lumbar three Right lumbar  four pedicle screw with MAZOR;  Surgeon: Kristeen Miss, MD;  Location: [facility];  Service: Neurosurgery;  Laterality: Right;  . HYSTEROSCOPY W/D&C  07/17/2010   with exc. endometrial polyps  . KNEE ARTHROSCOPY Right 03/07/2003; 01/14/2005; 08/31/2006  . KNEE ARTHROSCOPY Left 03/21/2003; 11/26/2004  . LUMBAR PERCUTANEOUS PEDICLE SCREW 3 LEVEL N/A 02/17/2018   Procedure: LUMBAR PERCUTANEOUS PEDICLE SCREW PLACEMENT LUMBAR THREE-SACRAL ONE;  Surgeon: Kristeen Miss, MD;  Location: [facility];  Service:  Neurosurgery;  Laterality: N/A;  . SHOULDER ARTHROSCOPY Right   . TOE SURGERY Left 01/10/2003   claw toe correction 2nd, 3rd, 4th toes  . TOTAL HIP ARTHROPLASTY Right 04/21/2016  . TOTAL HIP ARTHROPLASTY Right 04/21/2016   Procedure: TOTAL HIP ARTHROPLASTY ANTERIOR APPROACH;  Surgeon: Ninetta Lights, MD;  Location: [facility];  Service: Orthopedics;  Laterality: Right;  . TOTAL KNEE ARTHROPLASTY Left 01/21/2010  . TOTAL KNEE ARTHROPLASTY Right 08/26/2008    There were no vitals filed for this visit.  Subjective Assessment - 07/02/19 1037    Subjective  A little more sore today due to the weather.    Pertinent History  Past Surgical Hx: B TKA (2009, 2011) and R THA 2017. PMH includes; OA, Morbid Obesity, Depression, HTN, Lumbar Spondylosis, Polycystic Ovarian Syndrome, Pneumonia, and PONV    Limitations  Standing;Walking;Lifting;Sitting    How long can you sit comfortably?  Sitting is uncomfortable. No amount of time specified.     How long can you stand comfortably?  about 5-10 mins    How long can you walk comfortably?  Short period of time with no time period specified    Patient Stated Goals  Reports goal of being more mobile with increased flexibility. Wants to "atleast put lotion on her feet."    Pain Onset  More than a month ago    Pain Onset  More than a month ago         [facility] PT Assessment - 07/02/19 1038      Assessment   Medical Diagnosis  Dx of R LE weakness 2/2 L4 pedicle screw affecting the path of the L4 nerve root causing L4 radiculopathy. Pt had surgical replacement of hardware on the right side at L3 and L4 with robotically placed pedicle screws at those levels     Referring Provider (PT)  Dr. Kristeen Miss     Onset Date/Surgical Date  06/29/18    Next MD Visit  3 months out (October 2020)    Prior Therapy  neuro rehab PT, stopped back in march due to COVID      Precautions   Precautions  Fall;Back      Prior Function   Level of Independence  Independent    Vocation   Unemployed      Ambulation/Gait   Stairs  Yes    Stairs Assistance  7: Independent    Stair Management Technique  No rails;Alternating pattern;Forwards    Number of Stairs  8    Height of Stairs  6    Curb  6: Modified independent (Device/increase time)    Curb Details (indicate cue type and reason)  carrying 10lb in R then LUE stepping up and down x 2 to simulate entering and exiting her house carrying grocery sack      6 Minute Walk- Baseline   6 Minute Walk- Baseline  yes    BP (mmHg)  (!) 143/108    HR (bpm)  80    02 Sat (%  RA)  99 %    Modified Borg Scale for Dyspnea  0- Nothing at all    Perceived Rate of Exertion (Borg)  7- Very, very light      6 minute walk test results    Endurance additional comments  unable to perform today due to elevated BP; pt reports that she did not take her BP medication this morning and pain drives it up.      Standardized Balance Assessment   Standardized Balance Assessment  10 meter walk test    10 Meter Walk  7.3 seconds or 4.49 ft/sec      Functional Gait  Assessment   Gait assessed   Yes    Gait Level Surface  Walks 20 ft in less than 5.5 sec, no assistive devices, good speed, no evidence for imbalance, normal gait pattern, deviates no more than 6 in outside of the 12 in walkway width.    Change in Gait Speed  Able to smoothly change walking speed without loss of balance or gait deviation. Deviate no more than 6 in outside of the 12 in walkway width.    Gait with Horizontal Head Turns  Performs head turns smoothly with no change in gait. Deviates no more than 6 in outside 12 in walkway width    Gait with Vertical Head Turns  Performs head turns with no change in gait. Deviates no more than 6 in outside 12 in walkway width.    Gait and Pivot Turn  Pivot turns safely within 3 sec and stops quickly with no loss of balance.    Step Over Obstacle  Is able to step over one shoe box (4.5 in total height) without changing gait speed. No evidence of  imbalance.    Gait with Narrow Base of Support  Ambulates 4-7 steps.    Gait with Eyes Closed  Walks 20 ft, uses assistive device, slower speed, mild gait deviations, deviates 6-10 in outside 12 in walkway width. Ambulates 20 ft in less than 9 sec but greater than 7 sec.    Ambulating Backwards  Walks 20 ft, uses assistive device, slower speed, mild gait deviations, deviates 6-10 in outside 12 in walkway width.    Steps  Alternating feet, no rail.    Total Score  25    FGA comment:  25/30 = low risk for falls                   [facility] Adult PT Treatment/Exercise - 07/02/19 1114      Neuro Re-ed    Neuro Re-ed Details   Continued pain neuroscience education discussing nerve sensitivity and ion channel receptors and how they change every few days to alert [place] to changes in our environment: temperature, mechanical pressure, immune, stress.  Pt verbalized understanding saying, "so it really is real?".  Validated patient's experience and expectations about pain.  Pt reports she understands that the pain may never go away but she is pleased with how much function she has gained.  Discussed the importance of ongoing exercise with stretches, walking and aquatic, wellness, and coping strategies for stress to continue to manage flares in sypmtoms.  Pt verbalized understanding and states that her final personal goal is to be able to bring her RLE up and put lotion on her foot without assistance.  Will add goal             PT Education - 07/02/19 1118    Education Details  goals met, progress  made and areas to continue to focus on with recertification.  PNE    Person(s) Educated  Patient    Methods  Explanation    Comprehension  Verbalized understanding       PT Short Term Goals - 06/21/19 1128      PT SHORT TERM GOAL #1   Title  = LTG        PT Long Term Goals - 07/02/19 1037      PT LONG TERM GOAL #1   Title  Pt will be independent with final HEP in order to indicate improved  functional mobilty and decreased fall risk.    Status  Achieved      PT LONG TERM GOAL #2   Title  Pt will improve gait speed to >/=4.10 ft/sec in order to indicate decreased fall risk and safe community ambulator.    Baseline  4.5 ft/sec    Status  Achieved      PT LONG TERM GOAL #4   Title  Pt will improve FGA to >/=25/30 in order to indicate decreased fall risk.     Baseline  25/30    Status  Achieved      PT LONG TERM GOAL #5   Title  Pt will negotiate up/down 8 steps without rail in reciprocal pattern while carrying item (ie grocery bags) in order to indicate improved safety with home entry/exit.     Baseline  --    Status  Achieved      PT LONG TERM GOAL #6   Title  Pt will ambulate x 500' outdoors/indoors without AD with less antaglic gait pattern in order to indicate improved fluidity of gait and decreased pain.     Baseline  06/06/19: continues to present with antalgic gait pattern    Status  Unable to assess      PT LONG TERM GOAL #7   Title  Pt will improve 6 min walk test to1589' indicating improvement with functional endurance    10/25: Patient performs 675 with rollator to 818' 08/10/18   Baseline  06/06/19: did not test today due to time contraints, can reassess at next visit to re-establish baseline.    Status  Unable to assess       New LTG for recertification: PT Long Term Goals - 07/02/19 1037      PT LONG TERM GOAL #1   Title  Pt will be independent with final land and aquatic HEP in order to indicate improved functional mobilty and decreased fall risk.    Time  5    Period  Weeks    Status  Revised    Target Date  08/06/19      PT LONG TERM GOAL #2   Title  Pt will demonstrate improved RLE strength and AROM to be able to bring her RLE up to independently put lotion on R lower leg and foot    Baseline  --    Time  5    Period  Weeks    Status  New    Target Date  08/06/19      PT LONG TERM GOAL #3   Title  Pt will improve FGA to >/=28/30 in order to  indicate decreased fall risk.    Baseline  25/30    Time  5    Period  Weeks    Status  New    Target Date  08/06/19      PT LONG TERM GOAL #4  Title  Pt will improve 6 min walk test to 1589' indicating improvement with functional endurance    Baseline  unable to assess on 10/12 due to elevated BP    Time  5    Period  Weeks    Status  Revised    Target Date  08/06/19      PT LONG TERM GOAL #5   Title  Pt will negotiate up/down 12 steps without rail in reciprocal pattern to safely enter/exit mother's home    Baseline  --    Time  5    Period  Weeks    Status  Revised    Target Date  08/06/19      PT LONG TERM GOAL #6   Title  Pt will ambulate x 500' outdoors/indoors without AD with less antaglic gait pattern in order to indicate improved fluidity of gait and decreased pain.     Baseline  10/12 - unable to assess due to BP    Time  5    Period  Weeks    Status  Revised      PT LONG TERM GOAL #7   Title  Pt will improve 6 min walk test to 1589' indicating improvement with functional endurance   10/25: Patient performs 675 with rollator to 818' 08/10/18   Baseline  --    Status  --           Plan - 07/02/19 1122    Clinical Impression Statement  Pt is making good progress towards goals and has met 4/6 LTG: final 2 goals unable to be assessed due to elevated BP today.  Pt demonstrates improvement in gait velocity, improvement in balance during dynamic gait challenges, decreased falls risk and improved ability to safely negotiate stairs without UE support.  Pt continues to demonstrate mild RLE weakness, impaired ROM, impaired gait and mild balance impairments.  Pt will benefit from continued skilled PT services to address these impairments to maximize functional mobility independence and decrease falls risk.    Rehab Potential  Good    Clinical Impairments Affecting Rehab Potential  Pt is motivated to improve. Strength gains may be limited 2/2 to nerve regeneration process      PT Frequency  1x / week    PT Duration  Other (comment)   5 weeks   PT Treatment/Interventions  ADLs/Self Care Home Management;Aquatic Therapy;DME Instruction;Gait training;Stair training;Functional mobility training;Therapeutic activities;Therapeutic exercise;Balance training;Neuromuscular re-education;Patient/family education;Electrical Stimulation;Passive range of motion;Energy conservation;Manual techniques;Dry needling    PT Next Visit Plan  dropped down to 1x/week; 4 pool and one final land for discharge.  Patient's personal goal is to be able to bring her RLE up and put lotion on her R foot without assistance.  Needs to work on ROM in low back/pelvis and hip - pelvic tilts, hip flexion, ABD and ER.    PT Home Exercise Plan   [id number]     Consulted and Agree with Plan of Care  Patient       Patient will benefit from skilled therapeutic intervention in order to improve the following deficits and impairments:  Abnormal gait, Decreased activity tolerance, Decreased balance, Decreased coordination, Decreased endurance, Decreased mobility, Decreased range of motion, Decreased strength, Impaired perceived functional ability, Impaired flexibility, Impaired sensation, Pain, Decreased knowledge of use of DME, Decreased knowledge of precautions, Obesity  Visit Diagnosis: Chronic midline low back pain without sciatica  Muscle weakness (generalized)  Unsteadiness on feet  Radiculopathy, lumbar region     Problem  List Patient Active Problem List   Diagnosis Date Noted  . Flat foot 05/07/2019  . Abnormality of gait 02/22/2019  . Lumbosacral radiculopathy at L4 06/09/2018  . Generalized anxiety disorder   . Pain   . Fall   . Labile blood pressure   . Neuropathic pain   . Therapeutic opioid induced constipation   . Urinary retention   . Lumbar radiculopathy 02/21/2018  . PCOS (polycystic ovarian syndrome)   . Morbid obesity ([place])   . Depression   . Generalized OA   . Postoperative  pain   . Acute blood loss anemia   . Status post surgery 02/17/2018  . Lumbar radiculopathy, chronic 02/17/2018  . Primary localized osteoarthritis of right hip 04/21/2016  . Sinusitis 06/25/2015  . Cough 06/25/2015  . Essential hypertension 04/03/2015  . Dehydration 04/03/2015  . CAP (community acquired pneumonia) 03/31/2015  . Right lower lobe pneumonia 03/31/2015  . Fever 03/31/2015  . Pregnancy 04/29/2014  . Miscarriage   . Incomplete miscarriage 04/02/2011    Class: Acute  . 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

## 2019-07-04 ENCOUNTER — Ambulatory Visit: Payer: Medicare Other | Admitting: Physical Therapy

## 2019-07-11 ENCOUNTER — Encounter: Payer: Self-pay | Admitting: Physical Therapy

## 2019-07-11 ENCOUNTER — Ambulatory Visit: Payer: Medicare Other | Admitting: Physical Therapy

## 2019-07-11 ENCOUNTER — Other Ambulatory Visit: Payer: Self-pay

## 2019-07-11 DIAGNOSIS — M545 Low back pain, unspecified: Secondary | ICD-10-CM

## 2019-07-11 DIAGNOSIS — R2681 Unsteadiness on feet: Secondary | ICD-10-CM

## 2019-07-11 DIAGNOSIS — G8929 Other chronic pain: Secondary | ICD-10-CM

## 2019-07-11 DIAGNOSIS — M6281 Muscle weakness (generalized): Secondary | ICD-10-CM | POA: Diagnosis not present

## 2019-07-11 NOTE — Therapy (Signed)
[facility] [address] [place], [place], [address] Phone: [phone]   Fax:  [phone]  Physical Therapy Treatment  Patient Details  Name: Ashley [name] MRN: [id number] Date of Birth: 1976/08/13 Referring Provider (PT): Dr. Kristeen Miss    Encounter Date: 07/11/2019  PT End of Session - 07/11/19 1821    Visit Number  38    Number of Visits  42    Date for PT Re-Evaluation  08/06/19    Authorization Type  [employer] Medicare-needs 10th visit progress note    PT Start Time  1535    PT Stop Time  1615    PT Time Calculation (min)  40 min    Activity Tolerance  Patient tolerated treatment well   elevated BP; unable to perform 6 minute walk   Behavior During Therapy  [facility] for tasks assessed/performed       Past Medical History:  Diagnosis Date  . Arthritis    knees, back  . 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 Complication of anesthesia   . Depression   . History of pneumonia   . Hypertension   . Lumbar spondylosis   . PCOS (polycystic ovarian syndrome)   . Pneumonia    2016  . PONV (postoperative nausea and vomiting)     Past Surgical History:  Procedure Laterality Date  . ABDOMINAL EXPOSURE N/A 02/17/2018   Procedure: ABDOMINAL EXPOSURE;  Surgeon: Rosetta Posner, MD;  Location: [facility] OR;  Service: Vascular;  Laterality: N/A;  . ANKLE SURGERY     x 2  . ANTERIOR LAT LUMBAR FUSION N/A 02/17/2018   Procedure: Lumbar three-four Lumbar four-five  Anterolateral lumbar interbody fusion;  Surgeon: Kristeen Miss, MD;  Location: [facility];  Service: Neurosurgery;  Laterality: N/A;  . ANTERIOR LUMBAR FUSION N/A 02/17/2018   Procedure: Lumbar five-Sacral one Anterior lumbar interbody fusion;  Surgeon: Kristeen Miss, MD;  Location: [place];  Service: Neurosurgery;  Laterality: N/A;  . APPLICATION OF ROBOTIC ASSISTANCE FOR SPINAL PROCEDURE N/A 02/17/2018   Procedure: APPLICATION OF ROBOTIC ASSISTANCE FOR SPINAL  PROCEDURE;  Surgeon: Kristeen Miss, MD;  Location: [place];  Service: Neurosurgery;  Laterality: N/A;  . APPLICATION OF ROBOTIC ASSISTANCE FOR SPINAL PROCEDURE N/A 06/09/2018   Procedure: APPLICATION OF ROBOTIC ASSISTANCE FOR SPINAL PROCEDURE;  Surgeon: Kristeen Miss, MD;  Location: [place];  Service: Neurosurgery;  Laterality: N/A;  . BACK SURGERY     2017  discectomy  . CARPAL TUNNEL RELEASE Left 07/04/2014   Procedure: LEFT CARPAL TUNNEL RELEASE;  Surgeon: Ninetta Lights, MD;  Location: [place];  Service: Orthopedics;  Laterality: Left;  . CARPAL TUNNEL RELEASE Right 08/08/2014   Procedure: RIGHT CARPAL TUNNEL RELEASE;  Surgeon: Ninetta Lights, MD;  Location: [place];  Service: Orthopedics;  Laterality: Right;  . DILATION AND CURETTAGE OF UTERUS    . DILATION AND EVACUATION  04/02/2011   Procedure: DILATATION AND EVACUATION (D&E);  Surgeon: Luz Lex, MD;  Location: [facility];  Service: Gynecology;  Laterality: N/A;  dvt left mid thigh  . DORSAL COMPARTMENT RELEASE Left 07/04/2014   Procedure: LEFT DEQUERVAINS;  Surgeon: Ninetta Lights, MD;  Location: [place];  Service: Orthopedics;  Laterality: Left;  . ENDOSCOPIC PLANTAR FASCIOTOMY Left 02/01/2002  . HARDWARE REMOVAL Right 06/09/2018   Procedure: Repositioning of Right Lumbar three Right lumbar  four pedicle screw with MAZOR;  Surgeon: Kristeen Miss, MD;  Location: [facility]  OR;  Service: Neurosurgery;  Laterality: Right;  . HYSTEROSCOPY W/D&C  07/17/2010   with exc. endometrial polyps  . KNEE ARTHROSCOPY Right 03/07/2003; 01/14/2005; 08/31/2006  . KNEE ARTHROSCOPY Left 03/21/2003; 11/26/2004  . LUMBAR PERCUTANEOUS PEDICLE SCREW 3 LEVEL N/A 02/17/2018   Procedure: LUMBAR PERCUTANEOUS PEDICLE SCREW PLACEMENT LUMBAR THREE-SACRAL ONE;  Surgeon: Kristeen Miss, MD;  Location: [place];  Service: Neurosurgery;  Laterality: N/A;  . SHOULDER ARTHROSCOPY Right   . TOE SURGERY Left 01/10/2003   claw toe correction  2nd, 3rd, 4th toes  . TOTAL HIP ARTHROPLASTY Right 04/21/2016  . TOTAL HIP ARTHROPLASTY Right 04/21/2016   Procedure: TOTAL HIP ARTHROPLASTY ANTERIOR APPROACH;  Surgeon: Ninetta Lights, MD;  Location: [place];  Service: Orthopedics;  Laterality: Right;  . TOTAL KNEE ARTHROPLASTY Left 01/21/2010  . TOTAL KNEE ARTHROPLASTY Right 08/26/2008    There were no vitals filed for this visit.  Subjective Assessment - 07/11/19 1818    Subjective  "i'm always in pain.  It's nothing new."    Pertinent History  Past Surgical Hx: B TKA (2009, 2011) and R THA 2017. PMH includes; OA, Morbid Obesity, Depression, HTN, Lumbar Spondylosis, Polycystic Ovarian Syndrome, Pneumonia, and PONV    Limitations  Standing;Walking;Lifting;Sitting    How long can you sit comfortably?  Sitting is uncomfortable. No amount of time specified.     How long can you stand comfortably?  about 5-10 mins    How long can you walk comfortably?  Short period of time with no time period specified    Patient Stated Goals  Reports goal of being more mobile with increased flexibility. Wants to "atleast put lotion on her feet."    Currently in Pain?  Yes    Pain Score  3     Pain Location  Back    Pain Orientation  Right    Pain Descriptors / Indicators  Aching    Pain Type  Chronic pain    Pain Onset  More than a month ago    Pain Frequency  Constant    Multiple Pain Sites  No    Pain Onset  More than a month ago       Aquatic therapy at [facility] - pool temp 87.4 degrees   Patient seen for aquatic therapy today.  Treatment took place in water 3.5-4 feet deep depending upon activity.  Pt entered  and exited the pool via step negotiation - step by step sequence with use of rails with supervision.   Pt performed Runner's stretch RLE 30 sec hold x 1 rep;  R heel cord stretch in standing with forefoot on wall 30 sec hold x 1 rep at beginning and at end of session   Pt perfomed water walking 23m x 1 rep then backwards walking 22m x 1 reps. Pt  performed sideways amb. 33m x 1 rep without UE support; 21m x 1 rep sideways with small squat without UE support   Pt performed R hip flexion, extension and abduction with use of buoyant ankle cuff 20 reps each direction;  Performed R hip internal/external rotation with knee flexed x 20 reps.  Standing at side of wall performing straight leg R hip adduction x 15 reps. Pt performed hip flexion/extension with knee flexed at 90 degrees 20 reps each leg with use of ankle cuff with minimal UE support on pool edge   Marching in place 20 reps each leg  Standing in center of pool performing forward<>backward step with RLE x 10  reps then with LLE x 10 reps   Pelvic tilts in partial squated/ position against wall  - 10 reps with 3 sec hold then performed 10 additional reps with sequence of pelvic tilt, hip flexion, knee extension, knee flexion, hip extension back to neutral then releasing pelvic tilt.    Standing at steps of pool with 1 UE support for RLE taps to 6" step x 10 then 12" step x 10 then 18" step x 10. Seated on pool bench for 10 reps of attempting to cross RLE across LLE (as if resting R ankle on L knee).  Seated on edge of step and performed 10 reps of sit<>stand with only RLE (LLE lifted off floor) with min assist for balance. Ai Chi postures - 3 postures - soothing and enclosing- 10 reps each for LE strengthening and core stabilization   Pt requires aquatic therapy for the buoyancy of the water to allow her to off load her body for spinal decompression and also to reduce the weight of her RLE In the water for decreased pain with AROM; buoyancy of water allows more freedom of movement than can be achieved with land exercise   Pt also requires viscosity of water for strengthening; water allows reduced gait deviation due to reduced joint loading through buoyancy which facilitates improved  posture without excess stress & pain    PT Short Term Goals - 06/21/19 1128      PT SHORT TERM GOAL  #1   Title  = LTG        PT Long Term Goals - 07/02/19 1037      PT LONG TERM GOAL #1   Title  Pt will be independent with final land and aquatic HEP in order to indicate improved functional mobilty and decreased fall risk.    Time  5    Period  Weeks    Status  Revised    Target Date  08/06/19      PT LONG TERM GOAL #2   Title  Pt will demonstrate improved RLE strength and AROM to be able to bring her RLE up to independently put lotion on R lower leg and foot    Baseline  --    Time  5    Period  Weeks    Status  New    Target Date  08/06/19      PT LONG TERM GOAL #3   Title  Pt will improve FGA to >/=28/30 in order to indicate decreased fall risk.    Baseline  25/30    Time  5    Period  Weeks    Status  New    Target Date  08/06/19      PT LONG TERM GOAL #4   Title  Pt will improve 6 min walk test to 1589' indicating improvement with functional endurance    Baseline  unable to assess on 10/12 due to elevated BP    Time  5    Period  Weeks    Status  Revised    Target Date  08/06/19      PT LONG TERM GOAL #5   Title  Pt will negotiate up/down 12 steps without rail in reciprocal pattern to safely enter/exit mother's home    Baseline  --    Time  5    Period  Weeks    Status  Revised    Target Date  08/06/19      PT LONG TERM GOAL #6  Title  Pt will ambulate x 500' outdoors/indoors without AD with less antaglic gait pattern in order to indicate improved fluidity of gait and decreased pain.     Baseline  10/12 - unable to assess due to BP    Time  5    Period  Weeks    Status  Revised      PT LONG TERM GOAL #7   Title  Pt will improve 6 min walk test to 1589' indicating improvement with functional endurance   10/25: Patient performs 675 with rollator to 818' 08/10/18   Baseline  --    Status  --            Plan - 07/11/19 1823    Clinical Impression Statement  Pt continues to tolerate aquatic exercises well and pushes herself to complete the set of  repetitions and was able to increase number of reps today, even with c/o mild discomfort in Rt hip reported with some of the exercises. Pt requires the buoyancy of the water for spinal decompression and offloading of Rt hip joint with unweighting for increased activity tolerance with less pain experienced in the water than on land.    Rehab Potential  Good    Clinical Impairments Affecting Rehab Potential  Pt is motivated to improve. Strength gains may be limited 2/2 to nerve regeneration process     PT Frequency  1x / week    PT Duration  Other (comment)   5 weeks   PT Treatment/Interventions  ADLs/Self Care Home Management;Aquatic Therapy;DME Instruction;Gait training;Stair training;Functional mobility training;Therapeutic activities;Therapeutic exercise;Balance training;Neuromuscular re-education;Patient/family education;Electrical Stimulation;Passive range of motion;Energy conservation;Manual techniques;Dry needling    PT Next Visit Plan  dropped down to 1x/week; 4 pool and one final land for discharge.  Patient's personal goal is to be able to bring her RLE up and put lotion on her R foot without assistance.  Needs to work on ROM in low back/pelvis and hip - pelvic tilts, hip flexion, ABD and ER.    PT Home Exercise Plan   [id number]     Consulted and Agree with Plan of Care  Patient       Patient will benefit from skilled therapeutic intervention in order to improve the following deficits and impairments:  Abnormal gait, Decreased activity tolerance, Decreased balance, Decreased coordination, Decreased endurance, Decreased mobility, Decreased range of motion, Decreased strength, Impaired perceived functional ability, Impaired flexibility, Impaired sensation, Pain, Decreased knowledge of use of DME, Decreased knowledge of precautions, Obesity  Visit Diagnosis: Muscle weakness (generalized)  Chronic midline low back pain without sciatica  Unsteadiness on feet     Problem List Patient  Active Problem List   Diagnosis Date Noted  . Flat foot 05/07/2019  . Abnormality of gait 02/22/2019  . Lumbosacral radiculopathy at L4 06/09/2018  . Generalized anxiety disorder   . Pain   . Fall   . Labile blood pressure   . Neuropathic pain   . Therapeutic opioid induced constipation   . Urinary retention   . Lumbar radiculopathy 02/21/2018  . PCOS (polycystic ovarian syndrome)   . Morbid obesity ([place])   . Depression   . Generalized OA   . Postoperative pain   . Acute blood loss anemia   . Status post surgery 02/17/2018  . Lumbar radiculopathy, chronic 02/17/2018  . Primary localized osteoarthritis of right hip 04/21/2016  . Sinusitis 06/25/2015  . Cough 06/25/2015  . Essential hypertension 04/03/2015  . Dehydration 04/03/2015  . CAP (community acquired pneumonia)  03/31/2015  . Right lower lobe pneumonia 03/31/2015  . Fever 03/31/2015  . Pregnancy 04/29/2014  . Miscarriage   . Incomplete miscarriage 04/02/2011    Class: Acute  . Deep vein thrombosis (DVT) ([place]) 04/02/2011    Class: Chronic    Narda Bonds, [place] [employer] 07/11/19 6:37 PM Phone: [phone] Fax: [facility] [place] [address] [place], [place], [address] Phone: [phone]   Fax:  [phone]  Name: NOLENE STAHL MRN: [id number] Date of Birth: June 20, 1976

## 2019-07-18 ENCOUNTER — Other Ambulatory Visit: Payer: Self-pay | Admitting: Obstetrics and Gynecology

## 2019-07-18 ENCOUNTER — Ambulatory Visit: Payer: Medicare Other | Admitting: Physical Therapy

## 2019-07-25 ENCOUNTER — Ambulatory Visit: Payer: Medicare Other | Attending: Neurological Surgery | Admitting: Physical Therapy

## 2019-08-01 ENCOUNTER — Ambulatory Visit: Payer: Self-pay | Admitting: Physical Therapy

## 2019-08-06 ENCOUNTER — Ambulatory Visit: Payer: Medicare Other | Admitting: Physical Therapy

## 2019-08-13 ENCOUNTER — Ambulatory Visit: Payer: Medicare Other | Admitting: Physical Medicine & Rehabilitation

## 2020-01-14 ENCOUNTER — Ambulatory Visit: Payer: Medicare Other | Admitting: Occupational Therapy

## 2020-02-04 ENCOUNTER — Other Ambulatory Visit: Payer: Self-pay

## 2020-02-04 ENCOUNTER — Ambulatory Visit: Payer: Medicare Other | Attending: Neurological Surgery | Admitting: Physical Therapy

## 2020-02-04 ENCOUNTER — Encounter: Payer: Self-pay | Admitting: Physical Therapy

## 2020-02-04 DIAGNOSIS — R2681 Unsteadiness on feet: Secondary | ICD-10-CM | POA: Insufficient documentation

## 2020-02-04 DIAGNOSIS — M545 Low back pain, unspecified: Secondary | ICD-10-CM

## 2020-02-04 DIAGNOSIS — M6281 Muscle weakness (generalized): Secondary | ICD-10-CM | POA: Diagnosis present

## 2020-02-04 DIAGNOSIS — R2689 Other abnormalities of gait and mobility: Secondary | ICD-10-CM | POA: Diagnosis present

## 2020-02-04 DIAGNOSIS — G8929 Other chronic pain: Secondary | ICD-10-CM | POA: Insufficient documentation

## 2020-02-05 NOTE — Therapy (Signed)
[facility] [address] [place], [place], [address] Phone: [phone]   Fax:  [phone]  Physical Therapy Evaluation  Patient Details  Name: Ashley [name] MRN: [id number] Date of Birth: [birth year] Referring Provider (PT): Dr. Kristeen Miss    Encounter Date: 02/04/2020  PT End of Session - 02/05/20 1301    Visit Number  1    Number of Visits  7    Date for PT Re-Evaluation  03/14/20    Authorization Type  UHC Medicare    Authorization Time Period  02-04-20 - 05-04-20    PT Start Time  0933    PT Stop Time  1016    PT Time Calculation (min)  43 min    Activity Tolerance  Patient tolerated treatment well    Behavior During Therapy  [facility] for tasks assessed/performed       Past Medical History:  Diagnosis Date  . Arthritis    knees, back  . 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 Complication of anesthesia   . Depression   . History of pneumonia   . Hypertension   . Lumbar spondylosis   . PCOS (polycystic ovarian syndrome)   . Pneumonia    2016  . PONV (postoperative nausea and vomiting)     Past Surgical History:  Procedure Laterality Date  . ABDOMINAL EXPOSURE N/A 02/17/2018   Procedure: ABDOMINAL EXPOSURE;  Surgeon: Rosetta Posner, MD;  Location: [facility] OR;  Service: Vascular;  Laterality: N/A;  . ANKLE SURGERY     x 2  . ANTERIOR LAT LUMBAR FUSION N/A 02/17/2018   Procedure: Lumbar three-four Lumbar four-five  Anterolateral lumbar interbody fusion;  Surgeon: Kristeen Miss, MD;  Location: [place];  Service: Neurosurgery;  Laterality: N/A;  . ANTERIOR LUMBAR FUSION N/A 02/17/2018   Procedure: Lumbar five-Sacral one Anterior lumbar interbody fusion;  Surgeon: Kristeen Miss, MD;  Location: [place];  Service: Neurosurgery;  Laterality: N/A;  . APPLICATION OF ROBOTIC ASSISTANCE FOR SPINAL PROCEDURE N/A 02/17/2018   Procedure: APPLICATION OF ROBOTIC ASSISTANCE FOR SPINAL PROCEDURE;  Surgeon: Kristeen Miss, MD;   Location: [place];  Service: Neurosurgery;  Laterality: N/A;  . APPLICATION OF ROBOTIC ASSISTANCE FOR SPINAL PROCEDURE N/A 06/09/2018   Procedure: APPLICATION OF ROBOTIC ASSISTANCE FOR SPINAL PROCEDURE;  Surgeon: Kristeen Miss, MD;  Location: [place];  Service: Neurosurgery;  Laterality: N/A;  . BACK SURGERY     2017  discectomy  . CARPAL TUNNEL RELEASE Left 07/04/2014   Procedure: LEFT CARPAL TUNNEL RELEASE;  Surgeon: Ninetta Lights, MD;  Location: [place];  Service: Orthopedics;  Laterality: Left;  . CARPAL TUNNEL RELEASE Right 08/08/2014   Procedure: RIGHT CARPAL TUNNEL RELEASE;  Surgeon: Ninetta Lights, MD;  Location: [place];  Service: Orthopedics;  Laterality: Right;  . DILATION AND CURETTAGE OF UTERUS    . DILATION AND EVACUATION  04/02/2011   Procedure: DILATATION AND EVACUATION (D&E);  Surgeon: Luz Lex, MD;  Location: [facility];  Service: Gynecology;  Laterality: N/A;  dvt left mid thigh  . DORSAL COMPARTMENT RELEASE Left 07/04/2014   Procedure: LEFT DEQUERVAINS;  Surgeon: Ninetta Lights, MD;  Location: [place];  Service: Orthopedics;  Laterality: Left;  . ENDOSCOPIC PLANTAR FASCIOTOMY Left 02/01/2002  . HARDWARE REMOVAL Right 06/09/2018   Procedure: Repositioning of Right Lumbar three Right lumbar  four pedicle screw with MAZOR;  Surgeon: Kristeen Miss, MD;  Location: [place];  Service:  Neurosurgery;  Laterality: Right;  . HYSTEROSCOPY WITH D & C  07/17/2010   with exc. endometrial polyps  . KNEE ARTHROSCOPY Right 03/07/2003; 01/14/2005; 08/31/2006  . KNEE ARTHROSCOPY Left 03/21/2003; 11/26/2004  . LUMBAR PERCUTANEOUS PEDICLE SCREW 3 LEVEL N/A 02/17/2018   Procedure: LUMBAR PERCUTANEOUS PEDICLE SCREW PLACEMENT LUMBAR THREE-SACRAL ONE;  Surgeon: Kristeen Miss, MD;  Location: [place];  Service: Neurosurgery;  Laterality: N/A;  . SHOULDER ARTHROSCOPY Right   . TOE SURGERY Left 01/10/2003   claw toe correction 2nd, 3rd, 4th toes  . TOTAL HIP  ARTHROPLASTY Right 04/21/2016  . TOTAL HIP ARTHROPLASTY Right 04/21/2016   Procedure: TOTAL HIP ARTHROPLASTY ANTERIOR APPROACH;  Surgeon: Ninetta Lights, MD;  Location: [place];  Service: Orthopedics;  Laterality: Right;  . TOTAL KNEE ARTHROPLASTY Left 01/21/2010  . TOTAL KNEE ARTHROPLASTY Right 08/26/2008    There were no vitals filed for this visit.       [facility] PT Assessment - 02/05/20 0001      Assessment   Medical Diagnosis  Lumbar Radiculopathy    Referring Provider (PT)  Dr. Kristeen Miss     Onset Date/Surgical Date  06/29/18   initial surgery was May 2019:  Hardware removal 06-09-18   Prior Therapy  04-14-18 -  11-22-18;  04-30-19 - 07-11-19      Precautions   Precautions  None      Restrictions   Weight Bearing Restrictions  No      Balance Screen   Has the patient fallen in the past 6 months  No    Has the patient had a decrease in activity level because of a fear of falling?   No    Is the patient reluctant to leave their home because of a fear of falling?   No      Prior Function   Level of Independence  Independent    Vocation  Unemployed      Strength   Overall Strength  Deficits    Strength Assessment Site  Hip;Knee;Ankle    Right/Left Hip  Right    Right Hip Flexion  3-/5    Right Hip Extension  --   pain with resistance   Right Hip ABduction  3+/5    Right Hip ADduction  --   c/o pain with resistance   Right/Left Knee  Right    Right Knee Flexion  4+/5    Right Knee Extension  3+/5      Transfers   Sit to Stand  5: Supervision    Five time sit to stand comments   14.15       Ambulation/Gait   Ambulation/Gait  Yes    Ambulation/Gait Assistance  6: Modified independent (Device/Increase time)    Ambulation Distance (Feet)  75 Feet    Assistive device  None    Gait Pattern  Within Functional Limits    Ambulation Surface  Level;Indoor    Gait velocity  8.25 secs= 3.98 ft/sec       Standardized Balance Assessment   Standardized Balance Assessment  Timed  Up and Go Test      Timed Up and Go Test   TUG  Normal TUG    Normal TUG (seconds)  8                  Objective measurements completed on examination: See above findings.              PT Education - 02/05/20 1259  Education Details  POC - including aquatic therapy and dry needling    Person(s) Educated  Patient    Methods  Explanation    Comprehension  Verbalized understanding       PT Short Term Goals - 06/21/19 1128      PT SHORT TERM GOAL #1   Title  = LTG        PT Long Term Goals - 02/05/20 2023      PT LONG TERM GOAL #1   Title  Pt will be independent with  aquatic HEP in order to indicate improved functional mobilty and general conditioning.    Time  6    Period  Weeks    Status  New    Target Date  03/14/20      PT LONG TERM GOAL #2   Title  Pt will report improved RLE strength and AROM to be able to bring her RLE up to independently put lotion on R lower leg and foot.    Time  6    Period  Weeks    Status  New    Target Date  03/14/20      PT LONG TERM GOAL #3   Title  Pt will subjectively report at least 50% improvement in pain in bil upper trap regions to increase independence and ease with self care and ADL's.    Time  6    Period  Weeks    Status  New    Target Date  03/14/20      PT LONG TERM GOAL #4   Title  Pt will report decreased back pain to </= 3/10 intensity prior to start of aquatic therapy session.    Baseline  5/10 on 02-04-20    Time  6    Period  Weeks    Status  New    Target Date  03/14/20      PT LONG TERM GOAL #7   Title  --   10/25: Patient performs [id number] with rollator to 818' 08/10/18            Plan - 02/05/20 1303    Clinical Impression Statement  Pt is a 44 yr old lady with L4 lumbosacral radiculopathy with Rt hip and knee musc. weakness with pain with resistance.  Pt also c/o pain in bil. shoulders and upper trap areas. Pt presents with mobility limitations with inability to squat down to  floor and decreased ease with reaching toward feet for applying lotion and donning shoes & socks.  Pt is currently ambulating without use of assistive device but demonstrates decreased high level balance skills, i.e. decreased Rt SLS.  Pt also reports she has had significant weight gain within past year and wishes to establish appropriate aquatic HEP for weight loss as well as for LE strengthening and condiitoning to be continued upon D/C from PT.    Personal Factors and Comorbidities  Fitness;Past/Current Experience;Comorbidity 2;Time since onset of injury/illness/exacerbation    Comorbidities  lumbar radiculopathy, back sx 2017 (discectomy), Lt TKA 01-21-10, Rt TKA 04-21-16, anterior lumbar fusion 02-07-18, lumbar spondylosis, HTN, PCOS, Depression    Examination-Activity Limitations  Locomotion Level;Transfers;Carry;Squat;Stand    Examination-Participation Restrictions  Cleaning;Community Activity;Laundry;Interpersonal Relationship;Shop;Meal Prep    Stability/Clinical Decision Making  Evolving/Moderate complexity    Clinical Decision Making  Moderate    Clinical Presentation due to:  lumbar radiculopathy    PT Frequency  1x / week    PT Duration  6 weeks   eval +  6 visits   PT Treatment/Interventions  ADLs/Self Care Home Management;Aquatic Therapy;Patient/family education;Dry needling;Manual techniques;Therapeutic exercise    PT Next Visit Plan  aquatic therapy, dry needling    PT Home Exercise Plan   [id number]     Consulted and Agree with Plan of Care  Patient       Patient will benefit from skilled therapeutic intervention in order to improve the following deficits and impairments:  Decreased balance, Decreased endurance, Decreased mobility, Decreased range of motion, Decreased strength, Impaired flexibility, Impaired sensation, Pain, Obesity, Difficulty walking, Decreased activity tolerance  Visit Diagnosis: Muscle weakness (generalized) - Plan: PT plan of care cert/re-cert  Chronic midline  low back pain without sciatica - Plan: PT plan of care cert/re-cert  Unsteadiness on feet - Plan: PT plan of care cert/re-cert  Other abnormalities of gait and mobility - Plan: PT plan of care cert/re-cert     Problem List Patient Active Problem List   Diagnosis Date Noted  . Flat foot 05/07/2019  . Abnormality of gait 02/22/2019  . Lumbosacral radiculopathy at L4 06/09/2018  . Generalized anxiety disorder   . Pain   . Fall   . Labile blood pressure   . Neuropathic pain   . Therapeutic opioid induced constipation   . Urinary retention   . Lumbar radiculopathy 02/21/2018  . PCOS (polycystic ovarian syndrome)   . Morbid obesity ([place])   . Depression   . Generalized OA   . Postoperative pain   . Acute blood loss anemia   . Status post surgery 02/17/2018  . Lumbar radiculopathy, chronic 02/17/2018  . Primary localized osteoarthritis of right hip 04/21/2016  . Sinusitis 06/25/2015  . Cough 06/25/2015  . Essential hypertension 04/03/2015  . Dehydration 04/03/2015  . CAP (community acquired pneumonia) 03/31/2015  . Right lower lobe pneumonia 03/31/2015  . Fever 03/31/2015  . Pregnancy 04/29/2014  . Miscarriage   . Incomplete miscarriage 04/02/2011    Class: Acute  . Deep vein thrombosis (DVT) ([place]) 04/02/2011    Class: Chronic    Sorin Frimpong, [place], PT 02/05/2020, 8:46 PM  [facility] [address] [place], [place], [address] Phone: [phone]   Fax:  [phone]  Name: Ashley [name] MRN: [id number] Date of Birth: Aug 17, 1976

## 2020-02-15 ENCOUNTER — Ambulatory Visit: Payer: Medicare Other | Admitting: Physical Therapy

## 2020-02-27 ENCOUNTER — Ambulatory Visit: Payer: Medicare Other | Attending: Neurological Surgery | Admitting: Physical Therapy

## 2020-02-27 ENCOUNTER — Encounter: Payer: Self-pay | Admitting: Physical Therapy

## 2020-02-27 ENCOUNTER — Other Ambulatory Visit: Payer: Self-pay

## 2020-02-27 DIAGNOSIS — M545 Low back pain, unspecified: Secondary | ICD-10-CM

## 2020-02-27 DIAGNOSIS — R2681 Unsteadiness on feet: Secondary | ICD-10-CM | POA: Diagnosis present

## 2020-02-27 DIAGNOSIS — R2689 Other abnormalities of gait and mobility: Secondary | ICD-10-CM | POA: Diagnosis present

## 2020-02-27 DIAGNOSIS — M6281 Muscle weakness (generalized): Secondary | ICD-10-CM | POA: Diagnosis present

## 2020-02-27 DIAGNOSIS — G8929 Other chronic pain: Secondary | ICD-10-CM | POA: Insufficient documentation

## 2020-02-27 NOTE — Therapy (Signed)
[facility] [address] [place], [place], [address] Phone: [phone]   Fax:  [phone]  Physical Therapy Treatment  Patient Details  Name: Ashley [name] MRN: [id number] Date of Birth: 1975-09-27 Referring Provider (PT): Dr. Kristeen Miss    Encounter Date: 02/27/2020  PT End of Session - 02/27/20 1432    Visit Number  2    Number of Visits  7    Date for PT Re-Evaluation  03/14/20    Authorization Type  [employer] Medicare    Authorization Time Period  02-04-20 - 05-04-20    PT Start Time  1330    PT Stop Time  1415    PT Time Calculation (min)  45 min    Equipment Utilized During Treatment  Other (comment)   arm bar bells, ankle buoyancy cuffs   Activity Tolerance  Patient tolerated treatment well    Behavior During Therapy  [facility] for tasks assessed/performed       Past Medical History:  Diagnosis Date  . Arthritis    knees, back  . 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 Complication of anesthesia   . Depression   . History of pneumonia   . Hypertension   . Lumbar spondylosis   . PCOS (polycystic ovarian syndrome)   . Pneumonia    2016  . PONV (postoperative nausea and vomiting)     Past Surgical History:  Procedure Laterality Date  . ABDOMINAL EXPOSURE N/A 02/17/2018   Procedure: ABDOMINAL EXPOSURE;  Surgeon: Rosetta Posner, MD;  Location: [facility] OR;  Service: Vascular;  Laterality: N/A;  . ANKLE SURGERY     x 2  . ANTERIOR LAT LUMBAR FUSION N/A 02/17/2018   Procedure: Lumbar three-four Lumbar four-five  Anterolateral lumbar interbody fusion;  Surgeon: Kristeen Miss, MD;  Location: [facility];  Service: Neurosurgery;  Laterality: N/A;  . ANTERIOR LUMBAR FUSION N/A 02/17/2018   Procedure: Lumbar five-Sacral one Anterior lumbar interbody fusion;  Surgeon: Kristeen Miss, MD;  Location: [facility];  Service: Neurosurgery;  Laterality: N/A;  . APPLICATION OF ROBOTIC ASSISTANCE FOR SPINAL PROCEDURE N/A 02/17/2018    Procedure: APPLICATION OF ROBOTIC ASSISTANCE FOR SPINAL PROCEDURE;  Surgeon: Kristeen Miss, MD;  Location: [facility];  Service: Neurosurgery;  Laterality: N/A;  . APPLICATION OF ROBOTIC ASSISTANCE FOR SPINAL PROCEDURE N/A 06/09/2018   Procedure: APPLICATION OF ROBOTIC ASSISTANCE FOR SPINAL PROCEDURE;  Surgeon: Kristeen Miss, MD;  Location: [facility];  Service: Neurosurgery;  Laterality: N/A;  . BACK SURGERY     2017  discectomy  . CARPAL TUNNEL RELEASE Left 07/04/2014   Procedure: LEFT CARPAL TUNNEL RELEASE;  Surgeon: Ninetta Lights, MD;  Location: [facility];  Service: Orthopedics;  Laterality: Left;  . CARPAL TUNNEL RELEASE Right 08/08/2014   Procedure: RIGHT CARPAL TUNNEL RELEASE;  Surgeon: Ninetta Lights, MD;  Location: [facility];  Service: Orthopedics;  Laterality: Right;  . DILATION AND CURETTAGE OF UTERUS    . DILATION AND EVACUATION  04/02/2011   Procedure: DILATATION AND EVACUATION (D&E);  Surgeon: Luz Lex, MD;  Location: [facility];  Service: Gynecology;  Laterality: N/A;  dvt left mid thigh  . DORSAL COMPARTMENT RELEASE Left 07/04/2014   Procedure: LEFT DEQUERVAINS;  Surgeon: Ninetta Lights, MD;  Location: [facility];  Service: Orthopedics;  Laterality: Left;  . ENDOSCOPIC PLANTAR FASCIOTOMY Left 02/01/2002  . HARDWARE REMOVAL Right 06/09/2018   Procedure: Repositioning of Right Lumbar three Right lumbar  four pedicle screw with MAZOR;  Surgeon: Kristeen Miss, MD;  Location: [facility];  Service: Neurosurgery;  Laterality: Right;  . HYSTEROSCOPY WITH D & C  07/17/2010   with exc. endometrial polyps  . KNEE ARTHROSCOPY Right 03/07/2003; 01/14/2005; 08/31/2006  . KNEE ARTHROSCOPY Left 03/21/2003; 11/26/2004  . LUMBAR PERCUTANEOUS PEDICLE SCREW 3 LEVEL N/A 02/17/2018   Procedure: LUMBAR PERCUTANEOUS PEDICLE SCREW PLACEMENT LUMBAR THREE-SACRAL ONE;  Surgeon: Kristeen Miss, MD;  Location: [facility];  Service: Neurosurgery;  Laterality: N/A;  . SHOULDER ARTHROSCOPY  Right   . TOE SURGERY Left 01/10/2003   claw toe correction 2nd, 3rd, 4th toes  . TOTAL HIP ARTHROPLASTY Right 04/21/2016  . TOTAL HIP ARTHROPLASTY Right 04/21/2016   Procedure: TOTAL HIP ARTHROPLASTY ANTERIOR APPROACH;  Surgeon: Ninetta Lights, MD;  Location: [facility];  Service: Orthopedics;  Laterality: Right;  . TOTAL KNEE ARTHROPLASTY Left 01/21/2010  . TOTAL KNEE ARTHROPLASTY Right 08/26/2008    There were no vitals filed for this visit.  Subjective Assessment - 02/27/20 1431    Subjective  Pt denies any changes since last visit.  Reports having a good day as far as pain goes but changes constantly.    Pertinent History  Past Surgical Hx: B TKA (2009, 2011) and R THA 2017. PMH includes; OA, Morbid Obesity, Depression, HTN, Lumbar Spondylosis, Polycystic Ovarian Syndrome, Pneumonia, and PONV    Limitations  Standing;Walking;Lifting;Sitting    How long can you sit comfortably?  Sitting is uncomfortable. No amount of time specified.     How long can you stand comfortably?  about 5-10 mins    How long can you walk comfortably?  Short period of time with no time period specified    Patient Stated Goals  Reports goal of being more mobile with increased flexibility. Wants to "atleast put lotion on her feet."    Currently in Pain?  Yes    Pain Score  3     Pain Location  Hip    Pain Orientation  Right;Lateral    Pain Descriptors / Indicators  Aching    Pain Type  Chronic pain    Pain Onset  More than a month ago    Pain Onset  More than a month ago        Aquatic therapy at [facility] - pool temp 86.7 degrees  Patient seen for aquatic therapy today. Treatment took place in water 3.5-4 feet deep depending upon activity. Pt entered and exited the pool via ramp and bil rails with supervision.  Pt performed Runner's stretch RLE 30 sec hold x 1 rep; R heel cord stretch in standing with forefoot on wall 30 sec hold x 1 rep.  Pt perfomed water walking 61m x2 rep then backwards walking 72m x 2  reps. Pt performed sideways amb. 51m x 2 rep without UE support; 21m x 2 rep sideways with small squat with UE abd/add   Pt performed bil hip flexion, extension, hamstring curl, hip flexion moving into extension and abduction with use of buoyant ankle cuff 15 reps each direction   Pelvic tilts in partial squated/ position against wall - 10 reps with 3 sec hold  Ai Chi postures - soothingand enclosing- 10 reps each for LE strengthening and core stabilization Marching forwards 81m x 3 without UE support.  Bil LE squat at edge of pool with UE support x 10 reps  Pt requires aquatic therapy for the buoyancy of the water to allow her to off load her body for  spinal decompression and also to reduce the weight of her RLE In the water for decreased pain with AROM; buoyancy of water allows more freedom of movement than can be achieved with land exercise  Pt also requires viscosity of water for strengthening; water allows reduced gait deviation due to reduced joint loading through buoyancy which facilitates improved posture without excess stress & pain   PT Long Term Goals - 02/05/20 2023      PT LONG TERM GOAL #1   Title  Pt will be independent with  aquatic HEP in order to indicate improved functional mobilty and general conditioning.    Time  6    Period  Weeks    Status  New    Target Date  03/14/20      PT LONG TERM GOAL #2   Title  Pt will report improved RLE strength and AROM to be able to bring her RLE up to independently put lotion on R lower leg and foot.    Time  6    Period  Weeks    Status  New    Target Date  03/14/20      PT LONG TERM GOAL #3   Title  Pt will subjectively report at least 50% improvement in pain in bil upper trap regions to increase independence and ease with self care and ADL's.    Time  6    Period  Weeks    Status  New    Target Date  03/14/20      PT LONG TERM GOAL #4   Title  Pt will report decreased back pain to </= 3/10 intensity prior to  start of aquatic therapy session.    Baseline  5/10 on 02-04-20    Time  6    Period  Weeks    Status  New    Target Date  03/14/20      PT LONG TERM GOAL #7   Title  --    Baseline  06/06/19: did not test today due to time contraints, can reassess at next visit to re-establish baseline.            Plan - 02/27/20 1432    Clinical Impression Statement  Pt tolerated aquatic session well today with no rest breaks needed or increase in pain.  Reports feels like she's "been to the gym" at end of session.  Challenged by balance activities and reports that she would not be able to do the same exercises on land due to benefit of buoyancy and resistance.  Cont PT per POC.    Personal Factors and Comorbidities  Fitness;Past/Current Experience;Comorbidity 2;Time since onset of injury/illness/exacerbation    Comorbidities  lumbar radiculopathy, back sx 2017 (discectomy), Lt TKA 01-21-10, Rt TKA 04-21-16, anterior lumbar fusion 02-07-18, lumbar spondylosis, HTN, PCOS, Depression    Examination-Activity Limitations  Locomotion Level;Transfers;Carry;Squat;Stand    Examination-Participation Restrictions  Cleaning;Community Activity;Laundry;Interpersonal Relationship;Shop;Meal Prep    Stability/Clinical Decision Making  Evolving/Moderate complexity    PT Frequency  1x / week    PT Duration  6 weeks   eval + 6 visits   PT Treatment/Interventions  ADLs/Self Care Home Management;Aquatic Therapy;Patient/family education;Dry needling;Manual techniques;Therapeutic exercise    PT Next Visit Plan  aquatic therapy, dry needling    PT Home Exercise Plan   [id number]     Consulted and Agree with Plan of Care  Patient       Patient will benefit from skilled therapeutic intervention in order to improve  the following deficits and impairments:  Decreased balance, Decreased endurance, Decreased mobility, Decreased range of motion, Decreased strength, Impaired flexibility, Impaired sensation, Pain, Obesity, Difficulty  walking, Decreased activity tolerance  Visit Diagnosis: Muscle weakness (generalized)  Chronic midline low back pain without sciatica  Unsteadiness on feet  Other abnormalities of gait and mobility     Problem List Patient Active Problem List   Diagnosis Date Noted  . Flat foot 05/07/2019  . Abnormality of gait 02/22/2019  . Lumbosacral radiculopathy at L4 06/09/2018  . Generalized anxiety disorder   . Pain   . Fall   . Labile blood pressure   . Neuropathic pain   . Therapeutic opioid induced constipation   . Urinary retention   . Lumbar radiculopathy 02/21/2018  . PCOS (polycystic ovarian syndrome)   . Morbid obesity ([place])   . Depression   . Generalized OA   . Postoperative pain   . Acute blood loss anemia   . Status post surgery 02/17/2018  . Lumbar radiculopathy, chronic 02/17/2018  . Primary localized osteoarthritis of right hip 04/21/2016  . Sinusitis 06/25/2015  . Cough 06/25/2015  . Essential hypertension 04/03/2015  . Dehydration 04/03/2015  . CAP (community acquired pneumonia) 03/31/2015  . Right lower lobe pneumonia 03/31/2015  . Fever 03/31/2015  . Pregnancy 04/29/2014  . Miscarriage   . Incomplete miscarriage 04/02/2011    Class: Acute  . Deep vein thrombosis (DVT) (HCC) 04/02/2011    Class: Chronic    Narda Bonds, [place] 02/27/20 2:40 PM Phone: [phone] Fax: [facility] [place] [address] [place], [place], [address] Phone: [phone]   Fax:  [phone]  Name: Ashley [name] MRN: [id number] Date of Birth: [birth year]

## 2020-02-28 NOTE — Addendum Note (Signed)
Addended by: Lamar Benes on: 02/28/2020 03:02 PM   Modules accepted: Orders

## 2020-02-28 NOTE — Progress Notes (Signed)
°   02/05/20 1303  Plan  Clinical Impression Statement Pt is a 44 yr old lady with L4 lumbosacral radiculopathy with Rt hip and knee musc. weakness with pain with resistance.  Pt also c/o pain in bil. shoulders and upper trap areas. Pt presents with mobility limitations with inability to squat down to floor and decreased ease with reaching toward feet for applying lotion and donning shoes & socks.  Pt is currently ambulating without use of assistive device but demonstrates decreased high level balance skills, i.e. decreased Rt SLS.  Pt also reports she has had significant weight gain within past year and wishes to establish appropriate aquatic HEP for weight loss as well as for LE strengthening and condiitoning to be continued upon D/C from PT.  Personal Factors and Comorbidities Fitness;Past/Current Experience;Comorbidity 2;Time since onset of injury/illness/exacerbation  Comorbidities lumbar radiculopathy, back sx 2017 (discectomy), Lt TKA 01-21-10, Rt TKA 04-21-16, anterior lumbar fusion 02-07-18, lumbar spondylosis, HTN, PCOS, Depression  Examination-Activity Limitations Locomotion Level;Transfers;Carry;Squat;Stand  Examination-Participation Restrictions Cleaning;Community Activity;Laundry;Interpersonal Relationship;Shop;Meal Prep  Pt will benefit from skilled therapeutic intervention in order to improve on the following deficits Decreased balance;Decreased endurance;Decreased mobility;Decreased range of motion;Decreased strength;Impaired flexibility;Impaired sensation;Pain;Obesity;Difficulty walking;Decreased activity tolerance  Stability/Clinical Decision Making Evolving/Moderate complexity  Clinical Decision Making Moderate  Clinical Presentation due to: lumbar radiculopathy  PT Frequency 2x / week  PT Duration 6 weeks (eval + 12 visits)  PT Treatment/Interventions ADLs/Self Care Home Management;Aquatic Therapy;Patient/family education;Dry needling;Manual techniques;Therapeutic exercise  PT Next  Visit Plan aquatic therapy, dry needling  PT Home Exercise Plan  [id number]   Consulted and Agree with Plan of Care Patient

## 2020-02-28 NOTE — Progress Notes (Signed)
   02/05/20 1303  Plan  Clinical Impression Statement Pt is a 44 yr old lady with L4 lumbosacral radiculopathy with Rt hip and knee musc. weakness with pain with resistance.  Pt also c/o pain in bil. shoulders and upper trap areas. Pt presents with mobility limitations with inability to squat down to floor and decreased ease with reaching toward feet for applying lotion and donning shoes & socks.  Pt is currently ambulating without use of assistive device but demonstrates decreased high level balance skills, i.e. decreased Rt SLS.  Pt also reports she has had significant weight gain within past year and wishes to establish appropriate aquatic HEP for weight loss as well as for LE strengthening and condiitoning to be continued upon D/C from PT.  Personal Factors and Comorbidities Fitness;Past/Current Experience;Comorbidity 2;Time since onset of injury/illness/exacerbation  Comorbidities lumbar radiculopathy, back sx 2017 (discectomy), Lt TKA 01-21-10, Rt TKA 04-21-16, anterior lumbar fusion 02-07-18, lumbar spondylosis, HTN, PCOS, Depression  Examination-Activity Limitations Locomotion Level;Transfers;Carry;Squat;Stand  Examination-Participation Restrictions Cleaning;Community Activity;Laundry;Interpersonal Relationship;Shop;Meal Prep  Pt will benefit from skilled therapeutic intervention in order to improve on the following deficits Decreased balance;Decreased endurance;Decreased mobility;Decreased range of motion;Decreased strength;Impaired flexibility;Impaired sensation;Pain;Obesity;Difficulty walking;Decreased activity tolerance  Stability/Clinical Decision Making Evolving/Moderate complexity  Clinical Decision Making Moderate  Clinical Presentation due to: lumbar radiculopathy  PT Frequency 2x / week  PT Duration 6 weeks (eval + 12 visits)  PT Treatment/Interventions ADLs/Self Care Home Management;Aquatic Therapy;Patient/family education;Dry needling;Manual techniques;Therapeutic exercise  PT Next  Visit Plan aquatic therapy, dry needling  PT Home Exercise Plan  [id number]   Consulted and Agree with Plan of Care Patient

## 2020-02-29 ENCOUNTER — Encounter: Payer: Self-pay | Admitting: Physical Therapy

## 2020-02-29 ENCOUNTER — Ambulatory Visit: Payer: Medicare Other | Admitting: Physical Therapy

## 2020-02-29 ENCOUNTER — Other Ambulatory Visit: Payer: Self-pay

## 2020-02-29 DIAGNOSIS — M6281 Muscle weakness (generalized): Secondary | ICD-10-CM

## 2020-02-29 NOTE — Therapy (Addendum)
[facility] [address] [place], [place], [address] Phone: [phone]   Fax:  [phone]  Physical Therapy Treatment  Patient Details  Name: Ashley [name] MRN: [id number] Date of Birth: 1976/03/03 Referring Provider (PT): Dr. Kristeen Miss    Encounter Date: 02/29/2020   PT End of Session - 02/29/20 1753    Visit Number 3    Number of Visits 7    Date for PT Re-Evaluation 03/14/20    Authorization Type UHC Medicare    Authorization Time Period 02-04-20 - 05-04-20    PT Start Time 1020    PT Stop Time 1110    PT Time Calculation (min) 50 min    Equipment Utilized During Treatment Other (comment)   dry needles   Activity Tolerance Patient tolerated treatment well    Behavior During Therapy [facility] for tasks assessed/performed           Past Medical History:  Diagnosis Date  . Arthritis    knees, back  . 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 Complication of anesthesia   . Depression   . History of pneumonia   . Hypertension   . Lumbar spondylosis   . PCOS (polycystic ovarian syndrome)   . Pneumonia    2016  . PONV (postoperative nausea and vomiting)     Past Surgical History:  Procedure Laterality Date  . ABDOMINAL EXPOSURE N/A 02/17/2018   Procedure: ABDOMINAL EXPOSURE;  Surgeon: Rosetta Posner, MD;  Location: [facility] OR;  Service: Vascular;  Laterality: N/A;  . ANKLE SURGERY     x 2  . ANTERIOR LAT LUMBAR FUSION N/A 02/17/2018   Procedure: Lumbar three-four Lumbar four-five  Anterolateral lumbar interbody fusion;  Surgeon: Kristeen Miss, MD;  Location: [facility];  Service: Neurosurgery;  Laterality: N/A;  . ANTERIOR LUMBAR FUSION N/A 02/17/2018   Procedure: Lumbar five-Sacral one Anterior lumbar interbody fusion;  Surgeon: Kristeen Miss, MD;  Location: [facility];  Service: Neurosurgery;  Laterality: N/A;  . APPLICATION OF ROBOTIC ASSISTANCE FOR SPINAL PROCEDURE N/A 02/17/2018   Procedure: APPLICATION OF  ROBOTIC ASSISTANCE FOR SPINAL PROCEDURE;  Surgeon: Kristeen Miss, MD;  Location: [place];  Service: Neurosurgery;  Laterality: N/A;  . APPLICATION OF ROBOTIC ASSISTANCE FOR SPINAL PROCEDURE N/A 06/09/2018   Procedure: APPLICATION OF ROBOTIC ASSISTANCE FOR SPINAL PROCEDURE;  Surgeon: Kristeen Miss, MD;  Location: [facility];  Service: Neurosurgery;  Laterality: N/A;  . BACK SURGERY     2017  discectomy  . CARPAL TUNNEL RELEASE Left 07/04/2014   Procedure: LEFT CARPAL TUNNEL RELEASE;  Surgeon: Ninetta Lights, MD;  Location: [facility];  Service: Orthopedics;  Laterality: Left;  . CARPAL TUNNEL RELEASE Right 08/08/2014   Procedure: RIGHT CARPAL TUNNEL RELEASE;  Surgeon: Ninetta Lights, MD;  Location: [facility] [facility];  Service: Orthopedics;  Laterality: Right;  . DILATION AND CURETTAGE OF UTERUS    . DILATION AND EVACUATION  04/02/2011   Procedure: DILATATION AND EVACUATION (D&E);  Surgeon: Luz Lex, MD;  Location: [facility];  Service: Gynecology;  Laterality: N/A;  dvt left mid thigh  . DORSAL COMPARTMENT RELEASE Left 07/04/2014   Procedure: LEFT DEQUERVAINS;  Surgeon: Ninetta Lights, MD;  Location: [facility];  Service: Orthopedics;  Laterality: Left;  . ENDOSCOPIC PLANTAR FASCIOTOMY Left 02/01/2002  . HARDWARE REMOVAL Right 06/09/2018   Procedure: Repositioning of Right Lumbar three Right lumbar  four pedicle screw with MAZOR;  Surgeon: Kristeen Miss,  MD;  Location: [facility];  Service: Neurosurgery;  Laterality: Right;  . HYSTEROSCOPY WITH D & C  07/17/2010   with exc. endometrial polyps  . KNEE ARTHROSCOPY Right 03/07/2003; 01/14/2005; 08/31/2006  . KNEE ARTHROSCOPY Left 03/21/2003; 11/26/2004  . LUMBAR PERCUTANEOUS PEDICLE SCREW 3 LEVEL N/A 02/17/2018   Procedure: LUMBAR PERCUTANEOUS PEDICLE SCREW PLACEMENT LUMBAR THREE-SACRAL ONE;  Surgeon: Kristeen Miss, MD;  Location: [facility];  Service: Neurosurgery;  Laterality: N/A;  . SHOULDER ARTHROSCOPY Right   . TOE SURGERY Left  01/10/2003   claw toe correction 2nd, 3rd, 4th toes  . TOTAL HIP ARTHROPLASTY Right 04/21/2016  . TOTAL HIP ARTHROPLASTY Right 04/21/2016   Procedure: TOTAL HIP ARTHROPLASTY ANTERIOR APPROACH;  Surgeon: Ninetta Lights, MD;  Location: [facility];  Service: Orthopedics;  Laterality: Right;  . TOTAL KNEE ARTHROPLASTY Left 01/21/2010  . TOTAL KNEE ARTHROPLASTY Right 08/26/2008    There were no vitals filed for this visit.   Subjective Assessment - 02/29/20 1024    Subjective Pt reports she is feels like she is walking around with her shoulders hunched up and feels a lot of extra tension in her shoulders.  Also feels it when she is trying to pick up her daughter.  Also feels the tension when on the bike or elliptical at the gym.    Pertinent History Past Surgical Hx: B TKA (2009, 2011) and R THA 2017. PMH includes; OA, Morbid Obesity, Depression, HTN, Lumbar Spondylosis, Polycystic Ovarian Syndrome, Pneumonia, and PONV    Limitations Standing;Walking;Lifting;Sitting    How long can you sit comfortably? Sitting is uncomfortable. No amount of time specified.     How long can you stand comfortably? about 5-10 mins    How long can you walk comfortably? Short period of time with no time period specified    Patient Stated Goals Reports goal of being more mobile with increased flexibility. Wants to "atleast put lotion on her feet."    Currently in Pain? Yes    Pain Score 5     Pain Location Neck    Pain Orientation Right;Left    Pain Descriptors / Indicators Tightness;Other (Comment)   tension   Pain Type Chronic pain    Pain Onset More than a month ago    Pain Onset More than a month ago                             [facility] Adult PT Treatment/Exercise - 02/29/20 1220      Therapeutic Activites    Therapeutic Activities Other Therapeutic Activities    Other Therapeutic Activities Peformed assessment of muscle tension in neck and shoulders, assessed resting position of scapula and  scapulohumeral rhythm with UE flexion.  Good rhythm noted on RUE; in LUE pt noted to initiate flexion with upper trap and shoulder elevation.  Pt also reports increased difficulty with lifting heavy objects - does not feel she can squat to lift due to knee replacements and back surgeries.  Performed re-education of trigger point dry needling - purpose of TDN, possible side effects and ways to mitigate side effects (heat/ice, hydration, stretches)      Exercises   Exercises Neck      Manual Therapy   Manual Therapy Soft tissue mobilization    Soft tissue mobilization with patient positioned in prone performed STM to bilat upper trapezius muscle along upper and middle fibers for lengthening after trigger point dry needling  Neck Exercises: Stretches   Upper Trapezius Stretch Right;Left;1 rep;30 seconds    Upper Trapezius Stretch Limitations after TDN            Trigger Point Dry Needling - 02/29/20 1218    Consent Given? Yes    Education Handout Provided Previously provided    Muscles Treated Head and Neck Upper trapezius;Levator scapulae    Dry Needling Comments performed on L and R side, pt in prone    Upper Trapezius Response Twitch reponse elicited;Palpable increased muscle length    Levator Scapulae Response Twitch response elicited                PT Education - 02/29/20 1753    Education Details re-educated on dry needling, purpose, side effects and management of side effects with stretching, hydration and heat/ice    Person(s) Educated Patient    Methods Explanation    Comprehension Verbalized understanding               PT Long Term Goals - 02/05/20 2023      PT LONG TERM GOAL #1   Title Pt will be independent with  aquatic HEP in order to indicate improved functional mobilty and general conditioning.    Time 6    Period Weeks    Status New    Target Date 03/14/20      PT LONG TERM GOAL #2   Title Pt will report improved RLE strength and AROM to be  able to bring her RLE up to independently put lotion on R lower leg and foot.    Time 6    Period Weeks    Status New    Target Date 03/14/20      PT LONG TERM GOAL #3   Title Pt will subjectively report at least 50% improvement in pain in bil upper trap regions to increase independence and ease with self care and ADL's.    Time 6    Period Weeks    Status New    Target Date 03/14/20      PT LONG TERM GOAL #4   Title Pt will report decreased back pain to </= 3/10 intensity prior to start of aquatic therapy session.    Baseline 5/10 on 02-04-20    Time 6    Period Weeks    Status New    Target Date 03/14/20      PT LONG TERM GOAL #7   Title --    Baseline 06/06/19: did not test today due to time contraints, can reassess at next visit to re-establish baseline.                 Plan - 02/29/20 1755    Clinical Impression Statement To address myofascial pain and impaired scapulohumeral rhythm performed trigger point dry needling, manual therapy and stretches.  Pt reporting relief of tension after dry needling and feeling like her shoulders had "dropped".  Will continue to address and provide pt with HEP to maintain gains at next session.    Personal Factors and Comorbidities Fitness;Past/Current Experience;Comorbidity 2;Time since onset of injury/illness/exacerbation    Comorbidities lumbar radiculopathy, back sx 2017 (discectomy), Lt TKA 01-21-10, Rt TKA 04-21-16, anterior lumbar fusion 02-07-18, lumbar spondylosis, HTN, PCOS, Depression    Examination-Activity Limitations Locomotion Level;Transfers;Carry;Squat;Stand    Examination-Participation Restrictions Cleaning;Community Activity;Laundry;Interpersonal Relationship;Shop;Meal Prep    Stability/Clinical Decision Making Evolving/Moderate complexity    PT Frequency 1x / week    PT Duration 6 weeks   eval +  6 visits   PT Treatment/Interventions ADLs/Self Care Home Management;Aquatic Therapy;Patient/family education;Dry needling;Manual  techniques;Therapeutic exercise    PT Next Visit Plan aquatic therapy, Land-one more dry needling session - focus on L upper trap in prone and provide pt with appropriate neck stretches and back strengthening exercises, work on better scapulohumeral rhythm on L side, work on Scientist, forensic with legs    PT Home Exercise Plan  [id number]     Consulted and Agree with Plan of Care Patient           Patient will benefit from skilled therapeutic intervention in order to improve the following deficits and impairments:  Decreased balance, Decreased endurance, Decreased mobility, Decreased range of motion, Decreased strength, Impaired flexibility, Impaired sensation, Pain, Obesity, Difficulty walking, Decreased activity tolerance  Visit Diagnosis: Muscle weakness (generalized)     Problem List Patient Active Problem List   Diagnosis Date Noted  . Flat foot 05/07/2019  . Abnormality of gait 02/22/2019  . Lumbosacral radiculopathy at L4 06/09/2018  . Generalized anxiety disorder   . Pain   . Fall   . Labile blood pressure   . Neuropathic pain   . Therapeutic opioid induced constipation   . Urinary retention   . Lumbar radiculopathy 02/21/2018  . PCOS (polycystic ovarian syndrome)   . Morbid obesity ([place])   . Depression   . Generalized OA   . Postoperative pain   . Acute blood loss anemia   . Status post surgery 02/17/2018  . Lumbar radiculopathy, chronic 02/17/2018  . Primary localized osteoarthritis of right hip 04/21/2016  . Sinusitis 06/25/2015  . Cough 06/25/2015  . Essential hypertension 04/03/2015  . Dehydration 04/03/2015  . CAP (community acquired pneumonia) 03/31/2015  . Right lower lobe pneumonia 03/31/2015  . Fever 03/31/2015  . Pregnancy 04/29/2014  . Miscarriage   . Incomplete miscarriage 04/02/2011    Class: Acute  . Deep vein thrombosis (DVT) ([place]) 04/02/2011    Class: Chronic    Rico Junker, PT, DPT 03/01/20    2:13 PM    [facility] [address] [place], [place], [address] Phone: [phone]   Fax:  [phone]  Name: Ashley [name] MRN: [id number] Date of Birth: [birth year]

## 2020-03-03 ENCOUNTER — Other Ambulatory Visit: Payer: Self-pay

## 2020-03-03 ENCOUNTER — Ambulatory Visit: Payer: Medicare Other | Admitting: Physical Therapy

## 2020-03-03 ENCOUNTER — Encounter: Payer: Self-pay | Admitting: Physical Therapy

## 2020-03-03 DIAGNOSIS — G8929 Other chronic pain: Secondary | ICD-10-CM

## 2020-03-03 DIAGNOSIS — R2689 Other abnormalities of gait and mobility: Secondary | ICD-10-CM

## 2020-03-03 DIAGNOSIS — R2681 Unsteadiness on feet: Secondary | ICD-10-CM

## 2020-03-03 DIAGNOSIS — M545 Low back pain, unspecified: Secondary | ICD-10-CM

## 2020-03-03 DIAGNOSIS — M6281 Muscle weakness (generalized): Secondary | ICD-10-CM

## 2020-03-03 NOTE — Therapy (Signed)
[facility] [address] [place], [place], [address] Phone: [phone]   Fax:  [phone]  Physical Therapy Treatment  Patient Details  Name: Ashley [name] MRN: [id number] Date of Birth: 09/22/1975 Referring Provider (PT): Dr. Kristeen Miss    Encounter Date: 03/03/2020   PT End of Session - 03/03/20 1713    Visit Number 4    Number of Visits 7    Date for PT Re-Evaluation 03/14/20    Authorization Type UHC Medicare    Authorization Time Period 02-04-20 - 05-04-20    PT Start Time 1500    PT Stop Time 1545    PT Time Calculation (min) 45 min    Equipment Utilized During Treatment Other (comment)   ankle buoyancy cuffs, buoyancy hand bar bells   Activity Tolerance Patient tolerated treatment well    Behavior During Therapy [facility] for tasks assessed/performed           Past Medical History:  Diagnosis Date  . Arthritis    knees, back  . 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 Complication of anesthesia   . Depression   . History of pneumonia   . Hypertension   . Lumbar spondylosis   . PCOS (polycystic ovarian syndrome)   . Pneumonia    2016  . PONV (postoperative nausea and vomiting)     Past Surgical History:  Procedure Laterality Date  . ABDOMINAL EXPOSURE N/A 02/17/2018   Procedure: ABDOMINAL EXPOSURE;  Surgeon: Rosetta Posner, MD;  Location: [facility] OR;  Service: Vascular;  Laterality: N/A;  . ANKLE SURGERY     x 2  . ANTERIOR LAT LUMBAR FUSION N/A 02/17/2018   Procedure: Lumbar three-four Lumbar four-five  Anterolateral lumbar interbody fusion;  Surgeon: Kristeen Miss, MD;  Location: [facility];  Service: Neurosurgery;  Laterality: N/A;  . ANTERIOR LUMBAR FUSION N/A 02/17/2018   Procedure: Lumbar five-Sacral one Anterior lumbar interbody fusion;  Surgeon: Kristeen Miss, MD;  Location: [facility];  Service: Neurosurgery;  Laterality: N/A;  . APPLICATION OF ROBOTIC ASSISTANCE FOR SPINAL PROCEDURE N/A 02/17/2018     Procedure: APPLICATION OF ROBOTIC ASSISTANCE FOR SPINAL PROCEDURE;  Surgeon: Kristeen Miss, MD;  Location: [facility];  Service: Neurosurgery;  Laterality: N/A;  . APPLICATION OF ROBOTIC ASSISTANCE FOR SPINAL PROCEDURE N/A 06/09/2018   Procedure: APPLICATION OF ROBOTIC ASSISTANCE FOR SPINAL PROCEDURE;  Surgeon: Kristeen Miss, MD;  Location: [facility];  Service: Neurosurgery;  Laterality: N/A;  . BACK SURGERY     2017  discectomy  . CARPAL TUNNEL RELEASE Left 07/04/2014   Procedure: LEFT CARPAL TUNNEL RELEASE;  Surgeon: Ninetta Lights, MD;  Location: [place];  Service: Orthopedics;  Laterality: Left;  . CARPAL TUNNEL RELEASE Right 08/08/2014   Procedure: RIGHT CARPAL TUNNEL RELEASE;  Surgeon: Ninetta Lights, MD;  Location: [facility];  Service: Orthopedics;  Laterality: Right;  . DILATION AND CURETTAGE OF UTERUS    . DILATION AND EVACUATION  04/02/2011   Procedure: DILATATION AND EVACUATION (D&E);  Surgeon: Luz Lex, MD;  Location: [facility];  Service: Gynecology;  Laterality: N/A;  dvt left mid thigh  . DORSAL COMPARTMENT RELEASE Left 07/04/2014   Procedure: LEFT DEQUERVAINS;  Surgeon: Ninetta Lights, MD;  Location: [facility];  Service: Orthopedics;  Laterality: Left;  . ENDOSCOPIC PLANTAR FASCIOTOMY Left 02/01/2002  . HARDWARE REMOVAL Right 06/09/2018   Procedure: Repositioning of Right Lumbar three Right lumbar  four pedicle screw  with TFTDD;  Surgeon: Kristeen Miss, MD;  Location: [facility];  Service: Neurosurgery;  Laterality: Right;  . HYSTEROSCOPY WITH D & C  07/17/2010   with exc. endometrial polyps  . KNEE ARTHROSCOPY Right 03/07/2003; 01/14/2005; 08/31/2006  . KNEE ARTHROSCOPY Left 03/21/2003; 11/26/2004  . LUMBAR PERCUTANEOUS PEDICLE SCREW 3 LEVEL N/A 02/17/2018   Procedure: LUMBAR PERCUTANEOUS PEDICLE SCREW PLACEMENT LUMBAR THREE-SACRAL ONE;  Surgeon: Kristeen Miss, MD;  Location: [facility];  Service: Neurosurgery;  Laterality: N/A;  . SHOULDER  ARTHROSCOPY Right   . TOE SURGERY Left 01/10/2003   claw toe correction 2nd, 3rd, 4th toes  . TOTAL HIP ARTHROPLASTY Right 04/21/2016  . TOTAL HIP ARTHROPLASTY Right 04/21/2016   Procedure: TOTAL HIP ARTHROPLASTY ANTERIOR APPROACH;  Surgeon: Ninetta Lights, MD;  Location: [facility];  Service: Orthopedics;  Laterality: Right;  . TOTAL KNEE ARTHROPLASTY Left 01/21/2010  . TOTAL KNEE ARTHROPLASTY Right 08/26/2008    There were no vitals filed for this visit.   Subjective Assessment - 03/03/20 1711    Subjective Reports still being sore from dry needling on Friday.  "I want to be able to squat again and put lotion on my feet."    Pertinent History Past Surgical Hx: B TKA (2009, 2011) and R THA 2017. PMH includes; OA, Morbid Obesity, Depression, HTN, Lumbar Spondylosis, Polycystic Ovarian Syndrome, Pneumonia, and PONV    Limitations Standing;Walking;Lifting;Sitting    How long can you sit comfortably? Sitting is uncomfortable. No amount of time specified.     How long can you stand comfortably? about 5-10 mins    How long can you walk comfortably? Short period of time with no time period specified    Patient Stated Goals Reports goal of being more mobile with increased flexibility. Wants to "atleast put lotion on her feet."    Currently in Pain? Other (Comment)   soreness   Pain Onset More than a month ago    Pain Onset More than a month ago            Aquatic therapy at [facility] - pool temp 86.7 degrees  Patient seen for aquatic therapy today. Treatment took place in water 3.5-4 feet deep depending upon activity. Pt entered and exited the pool via ramp and bil rails with supervision.  Pt performed Runner's stretch RLE 30 sec hold x 1 rep; R heel cord stretch in standing with forefoot on wall 30 sec hold x 1 rep  Pt perfomed water walking 19m x2 rep then backwards walking 68m x 2 reps. Pt performed sideways amb. 44m x 2 rep sideways with squat with UE abd/add with bil hand bar bells.   Performed side step to R x 28m for R hip stretching. High march 29m forwards, 89m backwards.  Walking 35m x 2 with hand bar bells slightly  Under water for resistance.  Pt performed bil hip flexion, extension, hamstring curl, hip flexion moving into extension and abduction with use of buoyant ankle cuff 15 reps each direction.  Needs UE support with most exercises for support.  Performed hip add standing beside pool wall using wall as target x 15 reps both directions with cuffs.  Bil LE leg circles both directions x 15 reps each direction/side.  Bil LE squats x 15 then single leg x 10 reps each.  Needed UE support and had increased difficulty with single leg, esp on R side.  Pt requires aquatic therapy for the buoyancy of the water to allow her to off load her body  for spinal decompression and also to reduce the weight of her RLE In the water for decreased pain with AROM; buoyancy of water allows more freedom of movement than can be achieved with land exercise  Pt also requires viscosity of water for strengthening; water allows reduced gait deviation due to reduced joint loading through buoyancy which facilitates improved posture without excess stress & pain     PT Long Term Goals - 02/05/20 2023      PT LONG TERM GOAL #1   Title Pt will be independent with  aquatic HEP in order to indicate improved functional mobilty and general conditioning.    Time 6    Period Weeks    Status New    Target Date 03/14/20      PT LONG TERM GOAL #2   Title Pt will report improved RLE strength and AROM to be able to bring her RLE up to independently put lotion on R lower leg and foot.    Time 6    Period Weeks    Status New    Target Date 03/14/20      PT LONG TERM GOAL #3   Title Pt will subjectively report at least 50% improvement in pain in bil upper trap regions to increase independence and ease with self care and ADL's.    Time 6    Period Weeks    Status New    Target Date 03/14/20       PT LONG TERM GOAL #4   Title Pt will report decreased back pain to </= 3/10 intensity prior to start of aquatic therapy session.    Baseline 5/10 on 02-04-20    Time 6    Period Weeks    Status New    Target Date 03/14/20      PT LONG TERM GOAL #7   Title --    Baseline 06/06/19: did not test today due to time contraints, can reassess at next visit to re-establish baseline.                 Plan - 03/03/20 1714    Clinical Impression Statement Continues to report benefit from aquatic sessions.  Feels like it loosens her hips and feels like she has worked hard after session.  Continues with decreased LE ROM.  Cont per POC.    Personal Factors and Comorbidities Fitness;Past/Current Experience;Comorbidity 2;Time since onset of injury/illness/exacerbation    Comorbidities lumbar radiculopathy, back sx 2017 (discectomy), Lt TKA 01-21-10, Rt TKA 04-21-16, anterior lumbar fusion 02-07-18, lumbar spondylosis, HTN, PCOS, Depression    Examination-Activity Limitations Locomotion Level;Transfers;Carry;Squat;Stand    Examination-Participation Restrictions Cleaning;Community Activity;Laundry;Interpersonal Relationship;Shop;Meal Prep    Stability/Clinical Decision Making Evolving/Moderate complexity    PT Frequency 1x / week    PT Duration 6 weeks   eval + 6 visits   PT Treatment/Interventions ADLs/Self Care Home Management;Aquatic Therapy;Patient/family education;Dry needling;Manual techniques;Therapeutic exercise    PT Next Visit Plan aquatic therapy, Land-one more dry needling session - focus on L upper trap in prone and provide pt with appropriate neck stretches and back strengthening exercises, work on better scapulohumeral rhythm on L side, work on Scientist, forensic with legs    PT Home Exercise Plan  [id number]     Consulted and Agree with Plan of Care Patient           Patient will benefit from skilled therapeutic intervention in order to improve the following deficits and impairments:   Decreased balance, Decreased endurance, Decreased mobility, Decreased  range of motion, Decreased strength, Impaired flexibility, Impaired sensation, Pain, Obesity, Difficulty walking, Decreased activity tolerance  Visit Diagnosis: Muscle weakness (generalized)  Chronic midline low back pain without sciatica  Unsteadiness on feet  Other abnormalities of gait and mobility     Problem List Patient Active Problem List   Diagnosis Date Noted  . Flat foot 05/07/2019  . Abnormality of gait 02/22/2019  . Lumbosacral radiculopathy at L4 06/09/2018  . Generalized anxiety disorder   . Pain   . Fall   . Labile blood pressure   . Neuropathic pain   . Therapeutic opioid induced constipation   . Urinary retention   . Lumbar radiculopathy 02/21/2018  . PCOS (polycystic ovarian syndrome)   . Morbid obesity ([place])   . Depression   . Generalized OA   . Postoperative pain   . Acute blood loss anemia   . Status post surgery 02/17/2018  . Lumbar radiculopathy, chronic 02/17/2018  . Primary localized osteoarthritis of right hip 04/21/2016  . Sinusitis 06/25/2015  . Cough 06/25/2015  . Essential hypertension 04/03/2015  . Dehydration 04/03/2015  . CAP (community acquired pneumonia) 03/31/2015  . Right lower lobe pneumonia 03/31/2015  . Fever 03/31/2015  . Pregnancy 04/29/2014  . Miscarriage   . Incomplete miscarriage 04/02/2011    Class: Acute  . Deep vein thrombosis (DVT) ([facility]) 04/02/2011    Class: Chronic    Narda Bonds, [place] 03/03/20 5:30 PM Phone: [phone] Fax: [facility] [address] [place], [place], [address] Phone: [phone]   Fax:  [phone]  Name: Ashley [name] MRN: [id number] Date of Birth: 30-Apr-1976

## 2020-03-07 ENCOUNTER — Ambulatory Visit: Payer: Medicare Other | Admitting: Physical Therapy

## 2020-03-07 ENCOUNTER — Encounter: Payer: Self-pay | Admitting: Physical Therapy

## 2020-03-07 ENCOUNTER — Other Ambulatory Visit: Payer: Self-pay

## 2020-03-07 DIAGNOSIS — M6281 Muscle weakness (generalized): Secondary | ICD-10-CM | POA: Diagnosis not present

## 2020-03-07 NOTE — Therapy (Addendum)
[facility] [address] [place], [place], [address] Phone: [phone]   Fax:  [phone]  Physical Therapy Treatment  Patient Details  Name: Ashley [name] MRN: [id number] Date of Birth: March 30, 1976 Referring Provider (PT): Dr. Kristeen Miss    Encounter Date: 03/07/2020   PT End of Session - 03/07/20 1700    Visit Number 5    Number of Visits 7    Date for PT Re-Evaluation 03/14/20    Authorization Type UHC Medicare    Authorization Time Period 02-04-20 - 05-04-20    PT Start Time 1325    PT Stop Time 1414    PT Time Calculation (min) 49 min    Equipment Utilized During Treatment Other (comment)   dry needles   Activity Tolerance Patient tolerated treatment well    Behavior During Therapy [facility] for tasks assessed/performed           Past Medical History:  Diagnosis Date  . Arthritis    knees, back  . 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 Complication of anesthesia   . Depression   . History of pneumonia   . Hypertension   . Lumbar spondylosis   . PCOS (polycystic ovarian syndrome)   . Pneumonia    2016  . PONV (postoperative nausea and vomiting)     Past Surgical History:  Procedure Laterality Date  . ABDOMINAL EXPOSURE N/A 02/17/2018   Procedure: ABDOMINAL EXPOSURE;  Surgeon: Rosetta Posner, MD;  [facility];  Service: Vascular;  Laterality: N/A;  . ANKLE SURGERY     x 2  . ANTERIOR LAT LUMBAR FUSION N/A 02/17/2018   Procedure: Lumbar three-four Lumbar four-five  Anterolateral lumbar interbody fusion;  Surgeon: Kristeen Miss, MD;  Location: [facility];  Service: Neurosurgery;  Laterality: N/A;  . ANTERIOR LUMBAR FUSION N/A 02/17/2018   Procedure: Lumbar five-Sacral one Anterior lumbar interbody fusion;  Surgeon: Kristeen Miss, MD;  Location: [facility];  Service: Neurosurgery;  Laterality: N/A;  . APPLICATION OF ROBOTIC ASSISTANCE FOR SPINAL PROCEDURE N/A 02/17/2018   Procedure: APPLICATION OF  ROBOTIC ASSISTANCE FOR SPINAL PROCEDURE;  Surgeon: Kristeen Miss, MD;  Location: [place];  Service: Neurosurgery;  Laterality: N/A;  . APPLICATION OF ROBOTIC ASSISTANCE FOR SPINAL PROCEDURE N/A 06/09/2018   Procedure: APPLICATION OF ROBOTIC ASSISTANCE FOR SPINAL PROCEDURE;  Surgeon: Kristeen Miss, MD;  Location: [facility];  Service: Neurosurgery;  Laterality: N/A;  . BACK SURGERY     2017  discectomy  . CARPAL TUNNEL RELEASE Left 07/04/2014   Procedure: LEFT CARPAL TUNNEL RELEASE;  Surgeon: Ninetta Lights, MD;  Location: [place];  Service: Orthopedics;  Laterality: Left;  . CARPAL TUNNEL RELEASE Right 08/08/2014   Procedure: RIGHT CARPAL TUNNEL RELEASE;  Surgeon: Ninetta Lights, MD;  Location: [facility];  Service: Orthopedics;  Laterality: Right;  . DILATION AND CURETTAGE OF UTERUS    . DILATION AND EVACUATION  04/02/2011   Procedure: DILATATION AND EVACUATION (D&E);  Surgeon: Luz Lex, MD;  Location: [facility];  Service: Gynecology;  Laterality: N/A;  dvt left mid thigh  . DORSAL COMPARTMENT RELEASE Left 07/04/2014   Procedure: LEFT DEQUERVAINS;  Surgeon: Ninetta Lights, MD;  Location: [facility];  Service: Orthopedics;  Laterality: Left;  . ENDOSCOPIC PLANTAR FASCIOTOMY Left 02/01/2002  . HARDWARE REMOVAL Right 06/09/2018   Procedure: Repositioning of Right Lumbar three Right lumbar  four pedicle screw with MAZOR;  Surgeon: Kristeen Miss,  MD;  Location: [facility];  Service: Neurosurgery;  Laterality: Right;  . HYSTEROSCOPY WITH D & C  07/17/2010   with exc. endometrial polyps  . KNEE ARTHROSCOPY Right 03/07/2003; 01/14/2005; 08/31/2006  . KNEE ARTHROSCOPY Left 03/21/2003; 11/26/2004  . LUMBAR PERCUTANEOUS PEDICLE SCREW 3 LEVEL N/A 02/17/2018   Procedure: LUMBAR PERCUTANEOUS PEDICLE SCREW PLACEMENT LUMBAR THREE-SACRAL ONE;  Surgeon: Kristeen Miss, MD;  Location: [facility];  Service: Neurosurgery;  Laterality: N/A;  . SHOULDER ARTHROSCOPY Right   . TOE SURGERY Left  01/10/2003   claw toe correction 2nd, 3rd, 4th toes  . TOTAL HIP ARTHROPLASTY Right 04/21/2016  . TOTAL HIP ARTHROPLASTY Right 04/21/2016   Procedure: TOTAL HIP ARTHROPLASTY ANTERIOR APPROACH;  Surgeon: Ninetta Lights, MD;  Location: [facility] [place] [facility];  Service: Orthopedics;  Laterality: Right;  . TOTAL KNEE ARTHROPLASTY Left 01/21/2010  . TOTAL KNEE ARTHROPLASTY Right 08/26/2008    There were no vitals filed for this visit.   Subjective Assessment - 03/07/20 1330    Subjective Was sore from needling for 2-3 days.  Did not interfere with aquatic.  Does feel a little looser today, less tense.    Pertinent History Past Surgical Hx: B TKA (2009, 2011) and R THA 2017. PMH includes; OA, Morbid Obesity, Depression, HTN, Lumbar Spondylosis, Polycystic Ovarian Syndrome, Pneumonia, and PONV    Limitations Standing;Walking;Lifting;Sitting    How long can you sit comfortably? Sitting is uncomfortable. No amount of time specified.     How long can you stand comfortably? about 5-10 mins    How long can you walk comfortably? Short period of time with no time period specified    Patient Stated Goals Reports goal of being more mobile with increased flexibility. Wants to "atleast put lotion on her feet."                             [facility] Adult PT Treatment/Exercise - 03/09/20 1618      Exercises   Exercises Neck;Other Exercises    Other Exercises  Performed the following exercises below for shoulder extension, scapular retraction and scapular stability training.  Therapist provided verbal and tactile cues for feedback to decrease excessive activation/use of L upper trap and manual facilitation for improved L scapulohumeral rhythm      Manual Therapy   Manual Therapy Soft tissue mobilization    Soft tissue mobilization with patient positioned in prone performed STM to left upper trapezius muscle along upper and middle fibers for lengthening after trigger point dry needling      Neck Exercises:  Stretches   Upper Trapezius Stretch Right;Left;30 seconds;2 reps    Upper Trapezius Stretch Limitations after TDN          Access Code: [email or web address] URL: [email or web address] Date: 03/07/2020 Prepared by: Misty Stanley  Exercises Seated Cervical Sidebending Stretch - 1 x daily - 7 x weekly - 2 sets - 30 second hold Prone Shoulder Extension - Single Arm - 1 x daily - 7 x weekly - 2 sets - 10 reps Prone W Scapular Retraction - 1 x daily - 7 x weekly - 2 sets - 10 reps Prone Single Arm Shoulder Y - 1 x daily - 7 x weekly - 2 sets - 10 reps      03/07/20 1331  Trigger Point Dry Needling  Consent Given? Yes  Education Handout Provided Previously provided  Muscles Treated Head and Neck Upper trapezius  Dry Needling Comments performed in prone on L  side only today  Upper Trapezius Response Twitch reponse elicited;Palpable increased muscle length        03/07/20 1659  PT Education  Education Details shoulder/neck stretches and back strengthening exercises  Person(s) Educated Patient  Methods Explanation;Demonstration;Handout  Comprehension Verbalized understanding;Returned demonstration     PT Long Term Goals - 03/09/20 1631      PT LONG TERM GOAL #1   Title Pt will be independent with  aquatic HEP in order to indicate improved functional mobilty and general conditioning.  (all LTG due by 03/14/20)    Time 6    Period Weeks    Status New      PT LONG TERM GOAL #2   Title Pt will report improved RLE strength and AROM to be able to bring her RLE up to independently put lotion on R lower leg and foot.    Time 6    Period Weeks    Status New      PT LONG TERM GOAL #3   Title Pt will subjectively report at least 50% improvement in pain in bil upper trap regions to increase independence and ease with self care and ADL's.    Time 6    Period Weeks    Status New      PT LONG TERM GOAL #4   Title Pt will report decreased back pain to </= 3/10 intensity prior to  start of aquatic therapy session.    Baseline 5/10 on 02-04-20    Time 6    Period Weeks    Status New                 Plan - 03/09/20 1623    Clinical Impression Statement Continued to address increased muscle tension in L upper trapezius with trigger point dry needling and manual therapy followed by prone strengthening exercises to re-train L scapulohumeral rhythm and decrease use of upper trap to initiate L shoulder movement.  Pt demonstrated decreased mm resistance in L upper trap today as compared to last session when performing needling.  Pt to continue with aquatic therapy and HEP to maintain gains.    Personal Factors and Comorbidities Fitness;Past/Current Experience;Comorbidity 2;Time since onset of injury/illness/exacerbation    Comorbidities lumbar radiculopathy, back sx 2017 (discectomy), Lt TKA 01-21-10, Rt TKA 04-21-16, anterior lumbar fusion 02-07-18, lumbar spondylosis, HTN, PCOS, Depression    Examination-Activity Limitations Locomotion Level;Transfers;Carry;Squat;Stand    Examination-Participation Restrictions Cleaning;Community Activity;Laundry;Interpersonal Relationship;Shop;Meal Prep    Stability/Clinical Decision Making Evolving/Moderate complexity    PT Frequency 1x / week    PT Duration 6 weeks   eval + 6 visits   PT Treatment/Interventions ADLs/Self Care Home Management;Aquatic Therapy;Patient/family education;Dry needling;Manual techniques;Therapeutic exercise    PT Next Visit Plan LTG and recert due on 4/25; aquatic therapy - working also on posterior shoulder/scapular strengthening, work on Scientist, forensic with legs    PT Home Exercise Plan New exercise code - previous code had expired - [email or web address]    Consulted and Agree with Plan of Care Patient           Patient will benefit from skilled therapeutic intervention in order to improve the following deficits and impairments:  Decreased balance, Decreased endurance, Decreased mobility, Decreased range of motion,  Decreased strength, Impaired flexibility, Impaired sensation, Pain, Obesity, Difficulty walking, Decreased activity tolerance  Visit Diagnosis: Muscle weakness (generalized)     Problem List Patient Active Problem List   Diagnosis Date Noted  . Flat foot 05/07/2019  . Abnormality of gait  02/22/2019  . Lumbosacral radiculopathy at L4 06/09/2018  . Generalized anxiety disorder   . Pain   . Fall   . Labile blood pressure   . Neuropathic pain   . Therapeutic opioid induced constipation   . Urinary retention   . Lumbar radiculopathy 02/21/2018  . PCOS (polycystic ovarian syndrome)   . Morbid obesity ([place])   . Depression   . Generalized OA   . Postoperative pain   . Acute blood loss anemia   . Status post surgery 02/17/2018  . Lumbar radiculopathy, chronic 02/17/2018  . Primary localized osteoarthritis of right hip 04/21/2016  . Sinusitis 06/25/2015  . Cough 06/25/2015  . Essential hypertension 04/03/2015  . Dehydration 04/03/2015  . CAP (community acquired pneumonia) 03/31/2015  . Right lower lobe pneumonia 03/31/2015  . Fever 03/31/2015  . Pregnancy 04/29/2014  . Miscarriage   . Incomplete miscarriage 04/02/2011    Class: Acute  . 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 [place], [place], [address] Phone: [phone]   Fax:  [phone]  Name: BENELLI WINTHER MRN: [id number] Date of Birth: September 17, 1976

## 2020-03-07 NOTE — Patient Instructions (Addendum)
Access Code: [email or web address]  (new exercise code - previous one expired) URL: [email or web address] Date: 03/09/2020 Prepared by: Misty Stanley  Exercises Seated Cervical Sidebending Stretch - 1 x daily - 7 x weekly - 2 sets - 30 second hold Prone Shoulder Extension - Single Arm - 1 x daily - 7 x weekly - 2 sets - 10 reps Prone W Scapular Retraction - 1 x daily - 7 x weekly - 2 sets - 10 reps Prone Single Arm Shoulder Y - 1 x daily - 7 x weekly - 2 sets - 10 reps Hip Flexor Stretch at Edge of Bed - 2 x daily - 7 x weekly - 2 sets - 60 second hold Sidelying ITB Stretch off Table - 2 x daily - 7 x weekly - 2 sets - 60 second hold Prone Quadriceps Stretch with Strap - 2 x daily - 7 x weekly - 2 sets - 60 second hold Supine Hamstring Stretch with Strap - 2 x daily - 7 x weekly - 2 sets - 30 second hold Supine Piriformis Stretch with Leg Straight - 2 x daily - 7 x weekly - 2 sets - 30 second hold Sidestepping in Squat with Resistance and Arms Forward - 1 x daily - 7 x weekly - 2 sets - 10 reps Single Leg Bridge - 1 x daily - 7 x weekly - 2 sets - 10 reps Wall Quarter Squat - 1 x daily - 7 x weekly - 2 sets - 5 reps - 10 second hold

## 2020-03-10 ENCOUNTER — Ambulatory Visit: Payer: Medicare Other | Admitting: Physical Therapy

## 2020-03-12 ENCOUNTER — Ambulatory Visit: Payer: Medicare Other | Admitting: Physical Therapy

## 2020-03-12 ENCOUNTER — Other Ambulatory Visit: Payer: Self-pay

## 2020-03-12 DIAGNOSIS — M6281 Muscle weakness (generalized): Secondary | ICD-10-CM | POA: Diagnosis not present

## 2020-03-12 DIAGNOSIS — R2689 Other abnormalities of gait and mobility: Secondary | ICD-10-CM

## 2020-03-12 DIAGNOSIS — R2681 Unsteadiness on feet: Secondary | ICD-10-CM

## 2020-03-12 DIAGNOSIS — G8929 Other chronic pain: Secondary | ICD-10-CM

## 2020-03-12 DIAGNOSIS — M545 Low back pain, unspecified: Secondary | ICD-10-CM

## 2020-03-13 ENCOUNTER — Encounter: Payer: Self-pay | Admitting: Physical Therapy

## 2020-03-13 NOTE — Therapy (Signed)
[facility] [address] [place], [place], [address] Phone: [phone]   Fax:  [phone]  Physical Therapy Treatment  Patient Details  Name: Ashley [name] MRN: [id number] Date of Birth: 1976-05-24 Referring Provider (PT): Dr. Kristeen Miss    Encounter Date: 03/12/2020   PT End of Session - 03/13/20 1236    Visit Number 6    Number of Visits 7    Date for PT Re-Evaluation 03/14/20    Authorization Type UHC Medicare    Authorization Time Period 02-04-20 - 05-04-20    PT Start Time 1500    PT Stop Time 1545    PT Time Calculation (min) 45 min    Equipment Utilized During Treatment Other (comment)   ankle buoyancy cuffs   Activity Tolerance Patient tolerated treatment well;Other (comment)   moving more slowly overall than previous sessions but worked well   Behavior During Therapy WFL for tasks assessed/performed           Past Medical History:  Diagnosis Date  . Arthritis    knees, back  . 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 Complication of anesthesia   . Depression   . History of pneumonia   . Hypertension   . Lumbar spondylosis   . PCOS (polycystic ovarian syndrome)   . Pneumonia    2016  . PONV (postoperative nausea and vomiting)     Past Surgical History:  Procedure Laterality Date  . ABDOMINAL EXPOSURE N/A 02/17/2018   Procedure: ABDOMINAL EXPOSURE;  Surgeon: Rosetta Posner, MD;  Location: [facility] OR;  Service: Vascular;  Laterality: N/A;  . ANKLE SURGERY     x 2  . ANTERIOR LAT LUMBAR FUSION N/A 02/17/2018   Procedure: Lumbar three-four Lumbar four-five  Anterolateral lumbar interbody fusion;  Surgeon: Kristeen Miss, MD;  Location: [place];  Service: Neurosurgery;  Laterality: N/A;  . ANTERIOR LUMBAR FUSION N/A 02/17/2018   Procedure: Lumbar five-Sacral one Anterior lumbar interbody fusion;  Surgeon: Kristeen Miss, MD;  Location: [place];  Service: Neurosurgery;  Laterality: N/A;  . APPLICATION  OF ROBOTIC ASSISTANCE FOR SPINAL PROCEDURE N/A 02/17/2018   Procedure: APPLICATION OF ROBOTIC ASSISTANCE FOR SPINAL PROCEDURE;  Surgeon: Kristeen Miss, MD;  Location: [place] [facility];  Service: Neurosurgery;  Laterality: N/A;  . APPLICATION OF ROBOTIC ASSISTANCE FOR SPINAL PROCEDURE N/A 06/09/2018   Procedure: APPLICATION OF ROBOTIC ASSISTANCE FOR SPINAL PROCEDURE;  Surgeon: Kristeen Miss, MD;  Location: [facility];  Service: Neurosurgery;  Laterality: N/A;  . BACK SURGERY     2017  discectomy  . CARPAL TUNNEL RELEASE Left 07/04/2014   Procedure: LEFT CARPAL TUNNEL RELEASE;  Surgeon: Ninetta Lights, MD;  Location: [facility];  Service: Orthopedics;  Laterality: Left;  . CARPAL TUNNEL RELEASE Right 08/08/2014   Procedure: RIGHT CARPAL TUNNEL RELEASE;  Surgeon: Ninetta Lights, MD;  Location: [facility];  Service: Orthopedics;  Laterality: Right;  . DILATION AND CURETTAGE OF UTERUS    . DILATION AND EVACUATION  04/02/2011   Procedure: DILATATION AND EVACUATION (D&E);  Surgeon: Luz Lex, MD;  Location: [facility];  Service: Gynecology;  Laterality: N/A;  dvt left mid thigh  . DORSAL COMPARTMENT RELEASE Left 07/04/2014   Procedure: LEFT DEQUERVAINS;  Surgeon: Ninetta Lights, MD;  Location: [facility];  Service: Orthopedics;  Laterality: Left;  . ENDOSCOPIC PLANTAR FASCIOTOMY Left 02/01/2002  . HARDWARE REMOVAL Right 06/09/2018   Procedure: Repositioning of Right Lumbar  three Right lumbar  four pedicle screw with MAZOR;  Surgeon: Kristeen Miss, MD;  Location: [facility];  Service: Neurosurgery;  Laterality: Right;  . HYSTEROSCOPY WITH D & C  07/17/2010   with exc. endometrial polyps  . KNEE ARTHROSCOPY Right 03/07/2003; 01/14/2005; 08/31/2006  . KNEE ARTHROSCOPY Left 03/21/2003; 11/26/2004  . LUMBAR PERCUTANEOUS PEDICLE SCREW 3 LEVEL N/A 02/17/2018   Procedure: LUMBAR PERCUTANEOUS PEDICLE SCREW PLACEMENT LUMBAR THREE-SACRAL ONE;  Surgeon: Kristeen Miss, MD;  Location: [facility];   Service: Neurosurgery;  Laterality: N/A;  . SHOULDER ARTHROSCOPY Right   . TOE SURGERY Left 01/10/2003   claw toe correction 2nd, 3rd, 4th toes  . TOTAL HIP ARTHROPLASTY Right 04/21/2016  . TOTAL HIP ARTHROPLASTY Right 04/21/2016   Procedure: TOTAL HIP ARTHROPLASTY ANTERIOR APPROACH;  Surgeon: Ninetta Lights, MD;  Location: [facility];  Service: Orthopedics;  Laterality: Right;  . TOTAL KNEE ARTHROPLASTY Left 01/21/2010  . TOTAL KNEE ARTHROPLASTY Right 08/26/2008    There were no vitals filed for this visit.   Subjective Assessment - 03/13/20 1235    Subjective Pt had significant back pain over the weekend and was in bed most of the weekend.  Was not able to attend Monday session but is feeling better now.  Moving slowly.    Pertinent History Past Surgical Hx: B TKA (2009, 2011) and R THA 2017. PMH includes; OA, Morbid Obesity, Depression, HTN, Lumbar Spondylosis, Polycystic Ovarian Syndrome, Pneumonia, and PONV    Limitations Standing;Walking;Lifting;Sitting    How long can you sit comfortably? Sitting is uncomfortable. No amount of time specified.     How long can you stand comfortably? about 5-10 mins    How long can you walk comfortably? Short period of time with no time period specified    Patient Stated Goals Reports goal of being more mobile with increased flexibility. Wants to "atleast put lotion on her feet."    Currently in Pain? No/denies   just stiffness at present          Aquatic therapy at [facility] - pool temp 86.7degrees  Patient seen for aquatic therapy today. Treatment took place in water 3.5-4 feet deep depending upon activity. Pt entered and exited the pool viasteps and bil rails with supervision.  Pt performed Runner's stretch RLE 30 sec hold x 1 rep; R heel cord stretch in standing with forefoot on wall 30 sec hold x 1 rep  Pt perfomed water walking 51m x2rep then backwards walking 67m x 2reps. Pt performed sideways amb. 66m x 2rep sideways with squat with UE  abd/add.  High march 71m forwards, 65m backwards.  Walking 79m x 2 with hands  bells slightly under water for resistance.  Pt performedbilhip flexion, extension, hamstring curl, hip flexion moving into extensionand abduction with use of buoyant ankle cuff10reps each direction by alternating LE's.  Needs UE support with most exercises for support.  Performed hip add standing beside pool wall using wall as target x 10 reps both directions with cuffs.  Bil LE leg circles both directions x 15 reps each direction/side.  Bil LE hip external/internal rotation with knee flexed at 90 degrees x 15 reps each side.  Bil LE squats x 15 then single leg x 10 reps each.  Needed UE support and had increased difficulty with single leg, esp on R side.  Pt requires aquatic therapy for the buoyancy of the water to allow her to off load her body for spinal decompression and also to reduce the weight of her  RLE In the water for decreased pain with AROM; buoyancy of water allows more freedom of movement than can be achieved with land exercise  Pt also requires viscosity of water for strengthening; water allows reduced gait deviation due to reduced joint loading through buoyancy which facilitates improved posture without excess stress & pain    PT Long Term Goals - 03/09/20 1631      PT LONG TERM GOAL #1   Title Pt will be independent with  aquatic HEP in order to indicate improved functional mobilty and general conditioning.  (all LTG due by 03/14/20)    Time 6    Period Weeks    Status New      PT LONG TERM GOAL #2   Title Pt will report improved RLE strength and AROM to be able to bring her RLE up to independently put lotion on R lower leg and foot.    Time 6    Period Weeks    Status New      PT LONG TERM GOAL #3   Title Pt will subjectively report at least 50% improvement in pain in bil upper trap regions to increase independence and ease with self care and ADL's.    Time 6    Period Weeks     Status New      PT LONG TERM GOAL #4   Title Pt will report decreased back pain to </= 3/10 intensity prior to start of aquatic therapy session.    Baseline 5/10 on 02-04-20    Time 6    Period Weeks    Status New                 Plan - 03/13/20 1237    Clinical Impression Statement Pt reports having significant episode of back pain over the weekend starting friday and says she did not do anything to initiate it.  Able to work entire session today with decreased speed of movement.  Pt with improved flexibility after session. Balance more challenged today with activities.  Cont per POC.    Personal Factors and Comorbidities Fitness;Past/Current Experience;Comorbidity 2;Time since onset of injury/illness/exacerbation    Comorbidities lumbar radiculopathy, back sx 2017 (discectomy), Lt TKA 01-21-10, Rt TKA 04-21-16, anterior lumbar fusion 02-07-18, lumbar spondylosis, HTN, PCOS, Depression    Examination-Activity Limitations Locomotion Level;Transfers;Carry;Squat;Stand    Examination-Participation Restrictions Cleaning;Community Activity;Laundry;Interpersonal Relationship;Shop;Meal Prep    Stability/Clinical Decision Making Evolving/Moderate complexity    PT Frequency 1x / week    PT Duration 6 weeks   eval + 6 visits   PT Treatment/Interventions ADLs/Self Care Home Management;Aquatic Therapy;Patient/family education;Dry needling;Manual techniques;Therapeutic exercise    PT Next Visit Plan LTG and recert due on 8/67; aquatic therapy - working also on posterior shoulder/scapular strengthening, work on Scientist, forensic with legs    PT Home Exercise Plan New exercise code - previous code had expired - [email or web address]    Consulted and Agree with Plan of Care Patient           Patient will benefit from skilled therapeutic intervention in order to improve the following deficits and impairments:  Decreased balance, Decreased endurance, Decreased mobility, Decreased range of motion, Decreased strength,  Impaired flexibility, Impaired sensation, Pain, Obesity, Difficulty walking, Decreased activity tolerance  Visit Diagnosis: Muscle weakness (generalized)  Chronic midline low back pain without sciatica  Unsteadiness on feet  Other abnormalities of gait and mobility     Problem List Patient Active Problem List   Diagnosis Date Noted  .  Flat foot 05/07/2019  . Abnormality of gait 02/22/2019  . Lumbosacral radiculopathy at L4 06/09/2018  . Generalized anxiety disorder   . Pain   . Fall   . Labile blood pressure   . Neuropathic pain   . Therapeutic opioid induced constipation   . Urinary retention   . Lumbar radiculopathy 02/21/2018  . PCOS (polycystic ovarian syndrome)   . Morbid obesity ([place])   . Depression   . Generalized OA   . Postoperative pain   . Acute blood loss anemia   . Status post surgery 02/17/2018  . Lumbar radiculopathy, chronic 02/17/2018  . Primary localized osteoarthritis of right hip 04/21/2016  . Sinusitis 06/25/2015  . Cough 06/25/2015  . Essential hypertension 04/03/2015  . Dehydration 04/03/2015  . CAP (community acquired pneumonia) 03/31/2015  . Right lower lobe pneumonia 03/31/2015  . Fever 03/31/2015  . Pregnancy 04/29/2014  . Miscarriage   . Incomplete miscarriage 04/02/2011    Class: Acute  . 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

## 2020-03-17 ENCOUNTER — Ambulatory Visit: Payer: Medicare Other | Admitting: Physical Therapy

## 2020-03-17 ENCOUNTER — Other Ambulatory Visit: Payer: Self-pay

## 2020-03-17 ENCOUNTER — Encounter: Payer: Self-pay | Admitting: Physical Therapy

## 2020-03-17 DIAGNOSIS — R2689 Other abnormalities of gait and mobility: Secondary | ICD-10-CM

## 2020-03-17 DIAGNOSIS — M6281 Muscle weakness (generalized): Secondary | ICD-10-CM | POA: Diagnosis not present

## 2020-03-17 DIAGNOSIS — G8929 Other chronic pain: Secondary | ICD-10-CM

## 2020-03-17 DIAGNOSIS — R2681 Unsteadiness on feet: Secondary | ICD-10-CM

## 2020-03-17 NOTE — Therapy (Addendum)
[facility] [address] [place], [place], [address] Phone: [phone]   Fax:  [phone]  Physical Therapy Treatment  Patient Details  Name: Ashley [name] MRN: [id number] Date of Birth: 08/26/1976 Referring Provider (PT): Dr. Kristeen Miss    Encounter Date: 03/17/2020    Past Medical History:  Diagnosis Date  . Arthritis    knees, back  . 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 Complication of anesthesia   . Depression   . History of pneumonia   . Hypertension   . Lumbar spondylosis   . PCOS (polycystic ovarian syndrome)   . Pneumonia    2016  . PONV (postoperative nausea and vomiting)     Past Surgical History:  Procedure Laterality Date  . ABDOMINAL EXPOSURE N/A 02/17/2018   Procedure: ABDOMINAL EXPOSURE;  Surgeon: Rosetta Posner, MD;  Location: [facility];  Service: Vascular;  Laterality: N/A;  . ANKLE SURGERY     x 2  . ANTERIOR LAT LUMBAR FUSION N/A 02/17/2018   Procedure: Lumbar three-four Lumbar four-five  Anterolateral lumbar interbody fusion;  Surgeon: Kristeen Miss, MD;  Location: [facility];  Service: Neurosurgery;  Laterality: N/A;  . ANTERIOR LUMBAR FUSION N/A 02/17/2018   Procedure: Lumbar five-Sacral one Anterior lumbar interbody fusion;  Surgeon: Kristeen Miss, MD;  Location: [place];  Service: Neurosurgery;  Laterality: N/A;  . APPLICATION OF ROBOTIC ASSISTANCE FOR SPINAL PROCEDURE N/A 02/17/2018   Procedure: APPLICATION OF ROBOTIC ASSISTANCE FOR SPINAL PROCEDURE;  Surgeon: Kristeen Miss, MD;  Location: [facility];  Service: Neurosurgery;  Laterality: N/A;  . APPLICATION OF ROBOTIC ASSISTANCE FOR SPINAL PROCEDURE N/A 06/09/2018   Procedure: APPLICATION OF ROBOTIC ASSISTANCE FOR SPINAL PROCEDURE;  Surgeon: Kristeen Miss, MD;  Location: [facility];  Service: Neurosurgery;  Laterality: N/A;  . BACK SURGERY     2017  discectomy  . CARPAL TUNNEL RELEASE Left 07/04/2014   Procedure: LEFT CARPAL TUNNEL RELEASE;   Surgeon: Ninetta Lights, MD;  Location: [facility];  Service: Orthopedics;  Laterality: Left;  . CARPAL TUNNEL RELEASE Right 08/08/2014   Procedure: RIGHT CARPAL TUNNEL RELEASE;  Surgeon: Ninetta Lights, MD;  Location: [facility];  Service: Orthopedics;  Laterality: Right;  . DILATION AND CURETTAGE OF UTERUS    . DILATION AND EVACUATION  04/02/2011   Procedure: DILATATION AND EVACUATION (D&E);  Surgeon: Luz Lex, MD;  Location: [facility];  Service: Gynecology;  Laterality: N/A;  dvt left mid thigh  . DORSAL COMPARTMENT RELEASE Left 07/04/2014   Procedure: LEFT DEQUERVAINS;  Surgeon: Ninetta Lights, MD;  Location: [facility];  Service: Orthopedics;  Laterality: Left;  . ENDOSCOPIC PLANTAR FASCIOTOMY Left 02/01/2002  . HARDWARE REMOVAL Right 06/09/2018   Procedure: Repositioning of Right Lumbar three Right lumbar  four pedicle screw with MAZOR;  Surgeon: Kristeen Miss, MD;  Location: [facility];  Service: Neurosurgery;  Laterality: Right;  . HYSTEROSCOPY WITH D & C  07/17/2010   with exc. endometrial polyps  . KNEE ARTHROSCOPY Right 03/07/2003; 01/14/2005; 08/31/2006  . KNEE ARTHROSCOPY Left 03/21/2003; 11/26/2004  . LUMBAR PERCUTANEOUS PEDICLE SCREW 3 LEVEL N/A 02/17/2018   Procedure: LUMBAR PERCUTANEOUS PEDICLE SCREW PLACEMENT LUMBAR THREE-SACRAL ONE;  Surgeon: Kristeen Miss, MD;  Location: [facility];  Service: Neurosurgery;  Laterality: N/A;  . SHOULDER ARTHROSCOPY Right   . TOE SURGERY Left 01/10/2003   claw toe correction 2nd, 3rd, 4th toes  . TOTAL HIP ARTHROPLASTY Right 04/21/2016  .  TOTAL HIP ARTHROPLASTY Right 04/21/2016   Procedure: TOTAL HIP ARTHROPLASTY ANTERIOR APPROACH;  Surgeon: Ninetta Lights, MD;  Location: [facility];  Service: Orthopedics;  Laterality: Right;  . TOTAL KNEE ARTHROPLASTY Left 01/21/2010  . TOTAL KNEE ARTHROPLASTY Right 08/26/2008    There were no vitals filed for this visit.    Aquatic therapy at [facility] - pool temp  86.7degrees  Patient seen for aquatic therapy today. Treatment took place in water 3.5-4 feet deep depending upon activity. Pt entered and exited the pool viasteps and bil rails with supervision.  Pt performed Runner's stretch RLE 30 sec hold x 1 rep; R heel cord stretch in standing with forefoot on wall 30 sec hold x 1 rep  Pt perfomed water walking 77m x2rep then backwards walking 28m x 2reps. Pt performed sideways amb. 45mx2rep sideways with squat with hand bar bells for UE abd/add. High march 86m forwards, 39m backwards. Walking 23m x 2 with hands  bells slightly under water for resistance.  Side stepping with straight legs 7m x 2 for ROM.  Pt performedbilhip flexion, extension, hamstring curl, hip flexion moving into extension, abduction, ankle circles both directions and  hip external/internal rotation with knee flexed at 90 degrees-all  with use of buoyant ankle cuff15reps each direction by alternating LE's. Needs 1 UE support with most exercises for support.  Worked on decreasing UE support with all exercises and able to perform some without UE asssit.  Performed hip add standing beside pool wall using wall as target x 15 reps both directions with cuffs.   Bil LE squats x 15 then single leg x 10 reps each. Needed UE support and had increased difficulty with single leg, esp on R side.  Standing performing step moving 90 degrees with RLE to encourage R hip external rotation.  Performed bil side lunges x 10 x 2 sets working on increased ROM.  Discussed pt using tennis ball against wall on R hip for trigger points/tightness.  Pt reports she currently massages with tennis ball using her bands.  Pt requires aquatic therapy for the buoyancy of the water to allow her to off load her body for spinal decompression and also to reduce the weight of her RLE In the water for decreased pain with AROM; buoyancy of water allows more freedom of movement than can be achieved with  land exercise  Pt also requires viscosity of water for strengthening; water allows reduced gait deviation due to reduced joint loading through buoyancy which facilitates improved posture without excess stress & pain    PT Long Term Goals - 03/17/20 1817      PT LONG TERM GOAL #1   Title Pt will be independent with  aquatic HEP in order to indicate improved functional mobilty and general conditioning.  (all LTG due by 03/14/20)    Time 8    Period Weeks    Status On-going    Target Date 05/16/20      PT LONG TERM GOAL #2   Title Pt will report improved RLE strength and AROM to be able to bring her RLE up to independently put lotion on R lower leg and foot.    Time 8    Period Weeks    Status On-going    Target Date 05/16/20      PT LONG TERM GOAL #3   Title Pt will subjectively report at least 75% improvement in pain in bil upper trap regions to increase independence and ease with self care and  ADL's.    Baseline goal upgraded as 50% improvement has been achieved    Time 8    Period Weeks    Status Revised    Target Date 05/16/20      PT LONG TERM GOAL #4   Title Pt will report decreased back pain to </= 4/10 intensity prior to start of aquatic therapy session.    Baseline 5/10 on 02-04-20    Time 8    Period Weeks    Status Revised    Target Date 05/16/20                  Patient will benefit from skilled therapeutic intervention in order to improve the following deficits and impairments:  Decreased balance, Decreased endurance, Decreased mobility, Decreased range of motion, Decreased strength, Impaired flexibility, Impaired sensation, Pain, Obesity, Difficulty walking, Decreased activity tolerance  Visit Diagnosis: Muscle weakness (generalized) - Plan: PT plan of care cert/re-cert  Chronic midline low back pain without sciatica - Plan: PT plan of care cert/re-cert  Unsteadiness on feet - Plan: PT plan of care cert/re-cert  Other abnormalities of gait and  mobility - Plan: PT plan of care cert/re-cert     Problem List Patient Active Problem List   Diagnosis Date Noted  . Flat foot 05/07/2019  . Abnormality of gait 02/22/2019  . Lumbosacral radiculopathy at L4 06/09/2018  . Generalized anxiety disorder   . Pain   . Fall   . Labile blood pressure   . Neuropathic pain   . Therapeutic opioid induced constipation   . Urinary retention   . Lumbar radiculopathy 02/21/2018  . PCOS (polycystic ovarian syndrome)   . Morbid obesity ([place])   . Depression   . Generalized OA   . Postoperative pain   . Acute blood loss anemia   . Status post surgery 02/17/2018  . Lumbar radiculopathy, chronic 02/17/2018  . Primary localized osteoarthritis of right hip 04/21/2016  . Sinusitis 06/25/2015  . Cough 06/25/2015  . Essential hypertension 04/03/2015  . Dehydration 04/03/2015  . CAP (community acquired pneumonia) 03/31/2015  . Right lower lobe pneumonia 03/31/2015  . Fever 03/31/2015  . Pregnancy 04/29/2014  . Miscarriage   . Incomplete miscarriage 04/02/2011    Class: Acute  . Deep vein thrombosis (DVT) (HCC) 04/02/2011    Class: Chronic    Narda Bonds, [place] 04/21/20 3:48 PM Phone: [phone] Fax: [phone]   STG's established and LTG's upgraded by Jenness Corner Dilday, PT; recertification completed by  Jenness Corner Dilday, PT 04/21/20 3:50 PM    [facility] [address] [place], [place], [address] Phone: [phone]   Fax:  [phone]  Name: Ashley [name] MRN: [id number] Date of Birth: 05/13/1976

## 2020-03-26 ENCOUNTER — Ambulatory Visit: Payer: Medicare Other | Admitting: Physical Therapy

## 2020-04-02 ENCOUNTER — Other Ambulatory Visit: Payer: Self-pay

## 2020-04-02 ENCOUNTER — Ambulatory Visit: Payer: Medicare Other | Attending: Neurological Surgery | Admitting: Physical Therapy

## 2020-04-02 DIAGNOSIS — M6281 Muscle weakness (generalized): Secondary | ICD-10-CM | POA: Diagnosis present

## 2020-04-02 DIAGNOSIS — R2681 Unsteadiness on feet: Secondary | ICD-10-CM | POA: Diagnosis present

## 2020-04-02 DIAGNOSIS — M545 Low back pain: Secondary | ICD-10-CM | POA: Insufficient documentation

## 2020-04-02 DIAGNOSIS — G8929 Other chronic pain: Secondary | ICD-10-CM | POA: Diagnosis present

## 2020-04-02 DIAGNOSIS — R2689 Other abnormalities of gait and mobility: Secondary | ICD-10-CM | POA: Diagnosis present

## 2020-04-03 ENCOUNTER — Encounter: Payer: Self-pay | Admitting: Physical Therapy

## 2020-04-03 NOTE — Therapy (Signed)
[facility] [address] [place], [place], [address] Phone: [phone]   Fax:  [phone]  Physical Therapy Treatment  Patient Details  Name: Ashley [name] MRN: [id number] Date of Birth: Jan 31, 1976 Referring Provider (PT): Dr. Kristeen Miss    Encounter Date: 04/02/2020   PT End of Session - 04/03/20 1530    Visit Number 8    Number of Visits 15    Date for PT Re-Evaluation 05/04/20    Authorization Type [employer] Medicare    Authorization Time Period 02-04-20 - 05-04-20    PT Start Time 1500    PT Stop Time 1545    PT Time Calculation (min) 45 min    Equipment Utilized During Treatment Other (comment)   ankle buoyancy cuffs, buoyancy barbells   Activity Tolerance Patient tolerated treatment well;Other (comment)   moving more slowly overall than previous sessions but worked well   Behavior During Therapy WFL for tasks assessed/performed           Past Medical History:  Diagnosis Date  . Arthritis    knees, back  . 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 Complication of anesthesia   . Depression   . History of pneumonia   . Hypertension   . Lumbar spondylosis   . PCOS (polycystic ovarian syndrome)   . Pneumonia    2016  . PONV (postoperative nausea and vomiting)     Past Surgical History:  Procedure Laterality Date  . ABDOMINAL EXPOSURE N/A 02/17/2018   Procedure: ABDOMINAL EXPOSURE;  Surgeon: Rosetta Posner, MD;  Location: [facility] OR;  Service: Vascular;  Laterality: N/A;  . ANKLE SURGERY     x 2  . ANTERIOR LAT LUMBAR FUSION N/A 02/17/2018   Procedure: Lumbar three-four Lumbar four-five  Anterolateral lumbar interbody fusion;  Surgeon: Kristeen Miss, MD;  Location: [facility] [place];  Service: Neurosurgery;  Laterality: N/A;  . ANTERIOR LUMBAR FUSION N/A 02/17/2018   Procedure: Lumbar five-Sacral one Anterior lumbar interbody fusion;  Surgeon: Kristeen Miss, MD;  Location: [facility] [place];  Service: Neurosurgery;  Laterality:  N/A;  . APPLICATION OF ROBOTIC ASSISTANCE FOR SPINAL PROCEDURE N/A 02/17/2018   Procedure: APPLICATION OF ROBOTIC ASSISTANCE FOR SPINAL PROCEDURE;  Surgeon: Kristeen Miss, MD;  Location: [facility];  Service: Neurosurgery;  Laterality: N/A;  . APPLICATION OF ROBOTIC ASSISTANCE FOR SPINAL PROCEDURE N/A 06/09/2018   Procedure: APPLICATION OF ROBOTIC ASSISTANCE FOR SPINAL PROCEDURE;  Surgeon: Kristeen Miss, MD;  Location: [facility];  Service: Neurosurgery;  Laterality: N/A;  . BACK SURGERY     2017  discectomy  . CARPAL TUNNEL RELEASE Left 07/04/2014   Procedure: LEFT CARPAL TUNNEL RELEASE;  Surgeon: Ninetta Lights, MD;  Location: [facility];  Service: Orthopedics;  Laterality: Left;  . CARPAL TUNNEL RELEASE Right 08/08/2014   Procedure: RIGHT CARPAL TUNNEL RELEASE;  Surgeon: Ninetta Lights, MD;  Location: [facility];  Service: Orthopedics;  Laterality: Right;  . DILATION AND CURETTAGE OF UTERUS    . DILATION AND EVACUATION  04/02/2011   Procedure: DILATATION AND EVACUATION (D&E);  Surgeon: Luz Lex, MD;  Location: [facility];  Service: Gynecology;  Laterality: N/A;  dvt left mid thigh  . DORSAL COMPARTMENT RELEASE Left 07/04/2014   Procedure: LEFT DEQUERVAINS;  Surgeon: Ninetta Lights, MD;  Location: [facility];  Service: Orthopedics;  Laterality: Left;  . ENDOSCOPIC PLANTAR FASCIOTOMY Left 02/01/2002  . HARDWARE REMOVAL Right 06/09/2018   Procedure: Repositioning of  Right Lumbar three Right lumbar  four pedicle screw with MAZOR;  Surgeon: Kristeen Miss, MD;  Location: [facility];  Service: Neurosurgery;  Laterality: Right;  . HYSTEROSCOPY WITH D & C  07/17/2010   with exc. endometrial polyps  . KNEE ARTHROSCOPY Right 03/07/2003; 01/14/2005; 08/31/2006  . KNEE ARTHROSCOPY Left 03/21/2003; 11/26/2004  . LUMBAR PERCUTANEOUS PEDICLE SCREW 3 LEVEL N/A 02/17/2018   Procedure: LUMBAR PERCUTANEOUS PEDICLE SCREW PLACEMENT LUMBAR THREE-SACRAL ONE;  Surgeon: Kristeen Miss, MD;   Location: [facility];  Service: Neurosurgery;  Laterality: N/A;  . SHOULDER ARTHROSCOPY Right   . TOE SURGERY Left 01/10/2003   claw toe correction 2nd, 3rd, 4th toes  . TOTAL HIP ARTHROPLASTY Right 04/21/2016  . TOTAL HIP ARTHROPLASTY Right 04/21/2016   Procedure: TOTAL HIP ARTHROPLASTY ANTERIOR APPROACH;  Surgeon: Ninetta Lights, MD;  Location: [facility];  Service: Orthopedics;  Laterality: Right;  . TOTAL KNEE ARTHROPLASTY Left 01/21/2010  . TOTAL KNEE ARTHROPLASTY Right 08/26/2008    There were no vitals filed for this visit.   Subjective Assessment - 04/03/20 1527    Subjective Reports pain about the same.  No other changes to report.  States she tried some of the exercises on land that we do in pool and "they are so much harder".    Pertinent History Past Surgical Hx: B TKA (2009, 2011) and R THA 2017. PMH includes; OA, Morbid Obesity, Depression, HTN, Lumbar Spondylosis, Polycystic Ovarian Syndrome, Pneumonia, and PONV    Limitations Standing;Walking;Lifting;Sitting    How long can you sit comfortably? Sitting is uncomfortable. No amount of time specified.     How long can you stand comfortably? about 5-10 mins    How long can you walk comfortably? Short period of time with no time period specified    Patient Stated Goals Reports goal of being more mobile with increased flexibility. Wants to "atleast put lotion on her feet."    Currently in Pain? Other (Comment)   "normal pain level"          Aquatic therapy at [facility] - pool temp 86.7degrees  Patient seen for aquatic therapy today. Treatment took place in water 3.5-4 feet deep depending upon activity. Pt entered and exited the pool viasteps andbil rails with distant supervision.  Pt performed Runner's stretch RLE 30 sec hold x 1 rep; R heel cord stretch in standing with forefoot on wall 30 sec hold x 1 rep  Pt perfomed water walking 39mx2rep then backwards walking 262m 2reps. Pt performed sideways amb. 2565mep sideways with  squat with hand bar bells for UE abd/add. High march with hand bar bells to opposite knee 61m66mwards, 61m 76mwards. Walking 61m x59mith handsbells slightly under water for resistance.  Side stepping with straight legs 58m x 22mr ROM.  Pt performedbilhip flexion, extension, hamstring curl, hip flexion moving into extension, abduction, ankle circles both directions and  hip external/internal rotation with knee flexed at 90 degrees-all  with use of buoyant ankle cuff15reps each directionby alternating LE's. Needs 1 UE support with most exercises for support.  Worked on decreasing UE support with all exercises and able to perform some without UE asssit.  Performed hip add standing beside pool wall using wall as target x15reps both directions with cuffs.   Bil LE squats x 15 then single leg x 10 reps each. Needed UE support and had increased difficulty with single leg, esp on R side.   Pt requires aquatic therapy for the buoyancy of the  water to allow her to off load her body for spinal decompression and also to reduce the weight of her RLE In the water for decreased pain with AROM; buoyancy of water allows more freedom of movement than can be achieved with land exercise  Pt also requires viscosity of water for strengthening; water allows reduced gait deviation due to reduced joint loading through buoyancy which facilitates improved posture without excess stress & pain      PT Long Term Goals - 03/17/20 1817      PT LONG TERM GOAL #1   Title Pt will be independent with  aquatic HEP in order to indicate improved functional mobilty and general conditioning.  (all LTG due by 03/14/20)    Time 6    Period Weeks    Status On-going      PT LONG TERM GOAL #2   Title Pt will report improved RLE strength and AROM to be able to bring her RLE up to independently put lotion on R lower leg and foot.    Time 6    Period Weeks    Status Not Met      PT LONG TERM GOAL #3   Title Pt  will subjectively report at least 50% improvement in pain in bil upper trap regions to increase independence and ease with self care and ADL's.    Time 6    Period Weeks    Status Achieved      PT LONG TERM GOAL #4   Title Pt will report decreased back pain to </= 3/10 intensity prior to start of aquatic therapy session.    Baseline 5/10 on 02-04-20    Time 6    Period Weeks    Status On-going                 Plan - 04/03/20 1530    Clinical Impression Statement Session focused on strength, ROM, balance and endurance.  Pt reports trying some exercises at home with increased difficulty compared to pool sessions and tightness especially in R hip that improves after sessions.  Cont per poc.    Personal Factors and Comorbidities Fitness;Past/Current Experience;Comorbidity 2;Time since onset of injury/illness/exacerbation    Comorbidities lumbar radiculopathy, back sx 2017 (discectomy), Lt TKA 01-21-10, Rt TKA 04-21-16, anterior lumbar fusion 02-07-18, lumbar spondylosis, HTN, PCOS, Depression    Examination-Activity Limitations Locomotion Level;Transfers;Carry;Squat;Stand    Examination-Participation Restrictions Cleaning;Community Activity;Laundry;Interpersonal Relationship;Shop;Meal Prep    Stability/Clinical Decision Making Evolving/Moderate complexity    PT Frequency 1x / week    PT Duration 6 weeks   eval + 6 visits   PT Treatment/Interventions ADLs/Self Care Home Management;Aquatic Therapy;Patient/family education;Dry needling;Manual techniques;Therapeutic exercise    PT Next Visit Plan aquatic therapy - working also on posterior shoulder/scapular strengthening, work on Scientist, forensic with legs    PT Home Exercise Plan New exercise code - previous code had expired - [id number]    Consulted and Agree with Plan of Care Patient           Patient will benefit from skilled therapeutic intervention in order to improve the following deficits and impairments:  Decreased balance, Decreased  endurance, Decreased mobility, Decreased range of motion, Decreased strength, Impaired flexibility, Impaired sensation, Pain, Obesity, Difficulty walking, Decreased activity tolerance  Visit Diagnosis: Muscle weakness (generalized)  Chronic midline low back pain without sciatica  Unsteadiness on feet  Other abnormalities of gait and mobility     Problem List Patient Active Problem List   Diagnosis Date Noted  .  Flat foot 05/07/2019  . Abnormality of gait 02/22/2019  . Lumbosacral radiculopathy at L4 06/09/2018  . Generalized anxiety disorder   . Pain   . Fall   . Labile blood pressure   . Neuropathic pain   . Therapeutic opioid induced constipation   . Urinary retention   . Lumbar radiculopathy 02/21/2018  . PCOS (polycystic ovarian syndrome)   . Morbid obesity ([place])   . Depression   . Generalized OA   . Postoperative pain   . Acute blood loss anemia   . Status post surgery 02/17/2018  . Lumbar radiculopathy, chronic 02/17/2018  . Primary localized osteoarthritis of right hip 04/21/2016  . Sinusitis 06/25/2015  . Cough 06/25/2015  . Essential hypertension 04/03/2015  . Dehydration 04/03/2015  . CAP (community acquired pneumonia) 03/31/2015  . Right lower lobe pneumonia 03/31/2015  . Fever 03/31/2015  . Pregnancy 04/29/2014  . Miscarriage   . Incomplete miscarriage 04/02/2011    Class: Acute  . Deep vein thrombosis (DVT) ([place]) 04/02/2011    Class: Chronic    Narda Bonds, [place] 04/03/20 3:34 PM Phone: [phone] Fax: [facility] [address] [place], [place], [address] Phone: [phone]   Fax:  [phone]  Name: Ashley [name] MRN: [id number] Date of Birth: 1976/01/27

## 2020-04-09 ENCOUNTER — Ambulatory Visit: Payer: Medicare Other | Admitting: Physical Therapy

## 2020-04-09 ENCOUNTER — Encounter: Payer: Self-pay | Admitting: Physical Therapy

## 2020-04-09 ENCOUNTER — Other Ambulatory Visit: Payer: Self-pay

## 2020-04-09 DIAGNOSIS — M6281 Muscle weakness (generalized): Secondary | ICD-10-CM

## 2020-04-09 DIAGNOSIS — R2681 Unsteadiness on feet: Secondary | ICD-10-CM

## 2020-04-09 DIAGNOSIS — G8929 Other chronic pain: Secondary | ICD-10-CM

## 2020-04-09 DIAGNOSIS — M545 Low back pain, unspecified: Secondary | ICD-10-CM

## 2020-04-09 NOTE — Therapy (Signed)
[facility] [address] [place], [place], [address] Phone: [phone]   Fax:  [phone]  Physical Therapy Treatment  Patient Details  Name: Ashley [name] MRN: [id number] Date of Birth: July 21, 1976 Referring Provider (PT): Dr. Kristeen Miss    Encounter Date: 04/09/2020   PT End of Session - 04/09/20 1903    Visit Number 0    Number of Visits 15    Date for PT Re-Evaluation 05/04/20    Authorization Type [employer] Medicare    Authorization Time Period 02-04-20 - 05-04-20    PT Start Time 1415    PT Stop Time 1500    PT Time Calculation (min) 45 min    Equipment Utilized During Treatment Other (comment)   ankle buoyancy cuffs, buoyancy barbells   Activity Tolerance Patient tolerated treatment well;Other (comment)   moving more slowly overall than previous sessions but worked well   Behavior During Therapy WFL for tasks assessed/performed           Past Medical History:  Diagnosis Date  . Arthritis    knees, back  . 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 Complication of anesthesia   . Depression   . History of pneumonia   . Hypertension   . Lumbar spondylosis   . PCOS (polycystic ovarian syndrome)   . Pneumonia    2016  . PONV (postoperative nausea and vomiting)     Past Surgical History:  Procedure Laterality Date  . ABDOMINAL EXPOSURE N/A 02/17/2018   Procedure: ABDOMINAL EXPOSURE;  Surgeon: Rosetta Posner, MD;  Location: [facility] OR;  Service: Vascular;  Laterality: N/A;  . ANKLE SURGERY     x 2  . ANTERIOR LAT LUMBAR FUSION N/A 02/17/2018   Procedure: Lumbar three-four Lumbar four-five  Anterolateral lumbar interbody fusion;  Surgeon: Kristeen Miss, MD;  Location: [facility];  Service: Neurosurgery;  Laterality: N/A;  . ANTERIOR LUMBAR FUSION N/A 02/17/2018   Procedure: Lumbar five-Sacral one Anterior lumbar interbody fusion;  Surgeon: Kristeen Miss, MD;  Location: [facility];  Service: Neurosurgery;  Laterality:  N/A;  . APPLICATION OF ROBOTIC ASSISTANCE FOR SPINAL PROCEDURE N/A 02/17/2018   Procedure: APPLICATION OF ROBOTIC ASSISTANCE FOR SPINAL PROCEDURE;  Surgeon: Kristeen Miss, MD;  Location: [facility];  Service: Neurosurgery;  Laterality: N/A;  . APPLICATION OF ROBOTIC ASSISTANCE FOR SPINAL PROCEDURE N/A 06/09/2018   Procedure: APPLICATION OF ROBOTIC ASSISTANCE FOR SPINAL PROCEDURE;  Surgeon: Kristeen Miss, MD;  Location: [facility];  Service: Neurosurgery;  Laterality: N/A;  . BACK SURGERY     2017  discectomy  . CARPAL TUNNEL RELEASE Left 07/04/2014   Procedure: LEFT CARPAL TUNNEL RELEASE;  Surgeon: Ninetta Lights, MD;  Location: [facility];  Service: Orthopedics;  Laterality: Left;  . CARPAL TUNNEL RELEASE Right 08/08/2014   Procedure: RIGHT CARPAL TUNNEL RELEASE;  Surgeon: Ninetta Lights, MD;  Location: [facility];  Service: Orthopedics;  Laterality: Right;  . DILATION AND CURETTAGE OF UTERUS    . DILATION AND EVACUATION  04/02/2011   Procedure: DILATATION AND EVACUATION (D&E);  Surgeon: Luz Lex, MD;  Location: [facility];  Service: Gynecology;  Laterality: N/A;  dvt left mid thigh  . DORSAL COMPARTMENT RELEASE Left 07/04/2014   Procedure: LEFT DEQUERVAINS;  Surgeon: Ninetta Lights, MD;  Location: [facility];  Service: Orthopedics;  Laterality: Left;  . ENDOSCOPIC PLANTAR FASCIOTOMY Left 02/01/2002  . HARDWARE REMOVAL Right 06/09/2018   Procedure: Repositioning of  Right Lumbar three Right lumbar  four pedicle screw with MAZOR;  Surgeon: Kristeen Miss, MD;  Location: [facility];  Service: Neurosurgery;  Laterality: Right;  . HYSTEROSCOPY WITH D & C  07/17/2010   with exc. endometrial polyps  . KNEE ARTHROSCOPY Right 03/07/2003; 01/14/2005; 08/31/2006  . KNEE ARTHROSCOPY Left 03/21/2003; 11/26/2004  . LUMBAR PERCUTANEOUS PEDICLE SCREW 3 LEVEL N/A 02/17/2018   Procedure: LUMBAR PERCUTANEOUS PEDICLE SCREW PLACEMENT LUMBAR THREE-SACRAL ONE;  Surgeon: Kristeen Miss, MD;   Location: [facility];  Service: Neurosurgery;  Laterality: N/A;  . SHOULDER ARTHROSCOPY Right   . TOE SURGERY Left 01/10/2003   claw toe correction 2nd, 3rd, 4th toes  . TOTAL HIP ARTHROPLASTY Right 04/21/2016  . TOTAL HIP ARTHROPLASTY Right 04/21/2016   Procedure: TOTAL HIP ARTHROPLASTY ANTERIOR APPROACH;  Surgeon: Ninetta Lights, MD;  Location: [facility];  Service: Orthopedics;  Laterality: Right;  . TOTAL KNEE ARTHROPLASTY Left 01/21/2010  . TOTAL KNEE ARTHROPLASTY Right 08/26/2008    There were no vitals filed for this visit.   Subjective Assessment - 04/09/20 1900    Subjective Has not investigated pool options as part of pt's HEP after d/c.    Pertinent History Past Surgical Hx: B TKA (2009, 2011) and R THA 2017. PMH includes; OA, Morbid Obesity, Depression, HTN, Lumbar Spondylosis, Polycystic Ovarian Syndrome, Pneumonia, and PONV    Limitations Standing;Walking;Lifting;Sitting    How long can you sit comfortably? Sitting is uncomfortable. No amount of time specified.     How long can you stand comfortably? about 5-10 mins    How long can you walk comfortably? Short period of time with no time period specified    Patient Stated Goals Reports goal of being more mobile with increased flexibility. Wants to "atleast put lotion on her feet."    Currently in Pain? No/denies           Aquatic therapy at [facility] - pool temp 86.7degrees  Patient seen for aquatic therapy today. Treatment took place in water 3.5-4 feet deep depending upon activity. Pt entered and exited the pool viasteps andbil rails with distant supervision.  Pt performed Runner's stretch RLE 30 sec hold x 1 rep; R heel cord stretch in standing with forefoot on wall 30 sec hold x 1 rep  Pt perfomed water walking 73mx2rep then backwards walking 272m 2reps. Pt performed sideways amb. 25110mep sideways with squat withhand bar bells forUE abd/add. High march with hand bar bells to opposite knee 30m19m forwards, 30m 50m backwards. Walking 30m x36mith handsbells slightly under water for resistance.Side stepping with straight legs 30m x 75mr ROM.  Pt performedbilheel raises, hip flexion, extension, hamstring curl, abduction, ankle circles both directions andhip external/internal rotation with knee flexed at 90 degrees-allwith use of buoyant ankle cuff15reps each directionby alternating LE's. Needs1UE support with most exercises for support.Worked on decreasing UE support with all exercises and able to perform some without UE asssit.Performed hip add standing beside pool wall using wall as target x15reps both directions with cuffs.   Bil LE squats x 15 then single leg x 10 reps each. Able to perform bil without UE assist.  Needed UE support with single leg squats.  Pt requires aquatic therapy for the buoyancy of the water to allow her to off load her body for spinal decompression and also to reduce the weight of her RLE In the water for decreased pain with AROM; buoyancy of water allows more freedom of movement than  can be achieved with land exercise  Pt also requires viscosity of water for strengthening; water allows reduced gait deviation due to reduced joint loading through buoyancy which facilitates improved posture without excess stress & pain    PT Long Term Goals - 03/17/20 1817      PT LONG TERM GOAL #1   Title Pt will be independent with  aquatic HEP in order to indicate improved functional mobilty and general conditioning.  (all LTG due by 03/14/20)    Time 6    Period Weeks    Status On-going      PT LONG TERM GOAL #2   Title Pt will report improved RLE strength and AROM to be able to bring her RLE up to independently put lotion on R lower leg and foot.    Time 6    Period Weeks    Status Not Met      PT LONG TERM GOAL #3   Title Pt will subjectively report at least 50% improvement in pain in bil upper trap regions to increase independence and ease with self care  and ADL's.    Time 6    Period Weeks    Status Achieved      PT LONG TERM GOAL #4   Title Pt will report decreased back pain to </= 3/10 intensity prior to start of aquatic therapy session.    Baseline 5/10 on 02-04-20    Time 6    Period Weeks    Status On-going                 Plan - 04/09/20 1901    Clinical Impression Statement Sessions continue to focus on flexibility and strengthening along with endurance to develop HEP.  Pt slowly progressing toward goals.  Continues with decreased R LE strength and ROM.  Cont per poc.    Personal Factors and Comorbidities Fitness;Past/Current Experience;Comorbidity 2;Time since onset of injury/illness/exacerbation    Comorbidities lumbar radiculopathy, back sx 2017 (discectomy), Lt TKA 01-21-10, Rt TKA 04-21-16, anterior lumbar fusion 02-07-18, lumbar spondylosis, HTN, PCOS, Depression    Examination-Activity Limitations Locomotion Level;Transfers;Carry;Squat;Stand    Examination-Participation Restrictions Cleaning;Community Activity;Laundry;Interpersonal Relationship;Shop;Meal Prep    Stability/Clinical Decision Making Evolving/Moderate complexity    PT Frequency 1x / week    PT Duration 6 weeks   eval + 6 visits   PT Treatment/Interventions ADLs/Self Care Home Management;Aquatic Therapy;Patient/family education;Dry needling;Manual techniques;Therapeutic exercise    PT Next Visit Plan aquatic therapy - working also on posterior shoulder/scapular strengthening, work on Scientist, forensic with legs    PT Home Exercise Plan New exercise code - previous code had expired - [id number]    Consulted and Agree with Plan of Care Patient           Patient will benefit from skilled therapeutic intervention in order to improve the following deficits and impairments:  Decreased balance, Decreased endurance, Decreased mobility, Decreased range of motion, Decreased strength, Impaired flexibility, Impaired sensation, Pain, Obesity, Difficulty walking, Decreased  activity tolerance  Visit Diagnosis: Muscle weakness (generalized)  Chronic midline low back pain without sciatica  Unsteadiness on feet     Problem List Patient Active Problem List   Diagnosis Date Noted  . Flat foot 05/07/2019  . Abnormality of gait 02/22/2019  . Lumbosacral radiculopathy at L4 06/09/2018  . Generalized anxiety disorder   . Pain   . Fall   . Labile blood pressure   . Neuropathic pain   . Therapeutic opioid induced constipation   .  Urinary retention   . Lumbar radiculopathy 02/21/2018  . PCOS (polycystic ovarian syndrome)   . Morbid obesity ([place])   . Depression   . Generalized OA   . Postoperative pain   . Acute blood loss anemia   . Status post surgery 02/17/2018  . Lumbar radiculopathy, chronic 02/17/2018  . Primary localized osteoarthritis of right hip 04/21/2016  . Sinusitis 06/25/2015  . Cough 06/25/2015  . Essential hypertension 04/03/2015  . Dehydration 04/03/2015  . CAP (community acquired pneumonia) 03/31/2015  . Right lower lobe pneumonia 03/31/2015  . Fever 03/31/2015  . Pregnancy 04/29/2014  . Miscarriage   . Incomplete miscarriage 04/02/2011    Class: Acute  . Deep vein thrombosis (DVT) ([facility]) 04/02/2011    Class: Chronic    Narda Bonds, [place] 04/09/20 7:07 PM Phone: [phone] Fax: [facility] [address] [place], [place], [address] Phone: [phone]   Fax:  [phone]  Name: Ashley [name] MRN: [id number] Date of Birth: 05/14/1976

## 2020-04-14 ENCOUNTER — Other Ambulatory Visit: Payer: Self-pay

## 2020-04-14 ENCOUNTER — Encounter: Payer: Self-pay | Admitting: Physical Therapy

## 2020-04-14 ENCOUNTER — Ambulatory Visit: Payer: Medicare Other | Admitting: Physical Therapy

## 2020-04-14 DIAGNOSIS — M545 Low back pain, unspecified: Secondary | ICD-10-CM

## 2020-04-14 DIAGNOSIS — M6281 Muscle weakness (generalized): Secondary | ICD-10-CM

## 2020-04-14 DIAGNOSIS — G8929 Other chronic pain: Secondary | ICD-10-CM

## 2020-04-14 DIAGNOSIS — R2689 Other abnormalities of gait and mobility: Secondary | ICD-10-CM

## 2020-04-14 DIAGNOSIS — R2681 Unsteadiness on feet: Secondary | ICD-10-CM

## 2020-04-14 NOTE — Therapy (Addendum)
[facility] [address] [place], [place], [address] Phone: [phone]   Fax:  [phone]  Physical Therapy Treatment & 10th Visit [facility] Note   Reporting Period:  02-04-20 - 04-14-20 See below for progress towards goals; Plan is for 3 additional aquatic therapy treatment sessions, then D/C   Patient Details  Name: Ashley [name] MRN: [id number] Date of Birth: [birth year] Referring Provider (PT): Dr. Kristeen Miss    Encounter Date: 04/14/2020    Past Medical History:  Diagnosis Date  . Arthritis    knees, back  . 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 Complication of anesthesia   . Depression   . History of pneumonia   . Hypertension   . Lumbar spondylosis   . PCOS (polycystic ovarian syndrome)   . Pneumonia    2016  . PONV (postoperative nausea and vomiting)     Past Surgical History:  Procedure Laterality Date  . ABDOMINAL EXPOSURE N/A 02/17/2018   Procedure: ABDOMINAL EXPOSURE;  Surgeon: Rosetta Posner, MD;  Location: [facility] OR;  Service: Vascular;  Laterality: N/A;  . ANKLE SURGERY     x 2  . ANTERIOR LAT LUMBAR FUSION N/A 02/17/2018   Procedure: Lumbar three-four Lumbar four-five  Anterolateral lumbar interbody fusion;  Surgeon: Kristeen Miss, MD;  Location: [facility];  Service: Neurosurgery;  Laterality: N/A;  . ANTERIOR LUMBAR FUSION N/A 02/17/2018   Procedure: Lumbar five-Sacral one Anterior lumbar interbody fusion;  Surgeon: Kristeen Miss, MD;  Location: [facility];  Service: Neurosurgery;  Laterality: N/A;  . APPLICATION OF ROBOTIC ASSISTANCE FOR SPINAL PROCEDURE N/A 02/17/2018   Procedure: APPLICATION OF ROBOTIC ASSISTANCE FOR SPINAL PROCEDURE;  Surgeon: Kristeen Miss, MD;  Location: [facility];  Service: Neurosurgery;  Laterality: N/A;  . APPLICATION OF ROBOTIC ASSISTANCE FOR SPINAL PROCEDURE N/A 06/09/2018   Procedure: APPLICATION OF ROBOTIC ASSISTANCE FOR SPINAL PROCEDURE;  Surgeon: Kristeen Miss, MD;   Location: [facility];  Service: Neurosurgery;  Laterality: N/A;  . BACK SURGERY     2017  discectomy  . CARPAL TUNNEL RELEASE Left 07/04/2014   Procedure: LEFT CARPAL TUNNEL RELEASE;  Surgeon: Ninetta Lights, MD;  Location: [facility];  Service: Orthopedics;  Laterality: Left;  . CARPAL TUNNEL RELEASE Right 08/08/2014   Procedure: RIGHT CARPAL TUNNEL RELEASE;  Surgeon: Ninetta Lights, MD;  Location: [facility];  Service: Orthopedics;  Laterality: Right;  . DILATION AND CURETTAGE OF UTERUS    . DILATION AND EVACUATION  04/02/2011   Procedure: DILATATION AND EVACUATION (D&E);  Surgeon: Luz Lex, MD;  Location: [facility];  Service: Gynecology;  Laterality: N/A;  dvt left mid thigh  . DORSAL COMPARTMENT RELEASE Left 07/04/2014   Procedure: LEFT DEQUERVAINS;  Surgeon: Ninetta Lights, MD;  Location: [facility];  Service: Orthopedics;  Laterality: Left;  . ENDOSCOPIC PLANTAR FASCIOTOMY Left 02/01/2002  . HARDWARE REMOVAL Right 06/09/2018   Procedure: Repositioning of Right Lumbar three Right lumbar  four pedicle screw with MAZOR;  Surgeon: Kristeen Miss, MD;  Location: [facility];  Service: Neurosurgery;  Laterality: Right;  . HYSTEROSCOPY WITH D & C  07/17/2010   with exc. endometrial polyps  . KNEE ARTHROSCOPY Right 03/07/2003; 01/14/2005; 08/31/2006  . KNEE ARTHROSCOPY Left 03/21/2003; 11/26/2004  . LUMBAR PERCUTANEOUS PEDICLE SCREW 3 LEVEL N/A 02/17/2018   Procedure: LUMBAR PERCUTANEOUS PEDICLE SCREW PLACEMENT LUMBAR THREE-SACRAL ONE;  Surgeon: Kristeen Miss, MD;  Location: [facility];  Service: Neurosurgery;  Laterality:  N/A;  . SHOULDER ARTHROSCOPY Right   . TOE SURGERY Left 01/10/2003   claw toe correction 2nd, 3rd, 4th toes  . TOTAL HIP ARTHROPLASTY Right 04/21/2016  . TOTAL HIP ARTHROPLASTY Right 04/21/2016   Procedure: TOTAL HIP ARTHROPLASTY ANTERIOR APPROACH;  Surgeon: Ninetta Lights, MD;  Location: [facility];  Service: Orthopedics;  Laterality: Right;  . TOTAL KNEE  ARTHROPLASTY Left 01/21/2010  . TOTAL KNEE ARTHROPLASTY Right 08/26/2008    There were no vitals filed for this visit.    Aquatic therapy at [facility] - pool temp 86.7degrees  Patient seen for aquatic therapy today. Treatment took place in water 3.5-4 feet deep depending upon activity. Pt entered and exited the pool viasteps andbil rails withdistantsupervision.  Pt performed Runner's stretch RLE 30 sec hold x 1 rep; R heel cord stretch in standing with forefoot on wall 30 sec hold x 1 rep  Pt perfomed water walking 29m x2rep then backwards walking 52m x 2reps. Pt performed sideways amb. 20mx2rep sideways with squat withhand bar bells forUE abd/add. High marchwith hand bar bells to opposite knee75m x 2 forwards, 14m x 2 backwards. Side stepping with straight legs 40m x 2 for ROM.  Pt performedbilhip flexion, extension, hamstring curl, abduction, hip flexion moving into extension andhip external/internal rotation with knee flexed at 90 degrees-allwith use of buoyant ankle cuff15reps each directionby alternating LE's. Pt able to perform with intermittent UE support.Performed hip add standing beside pool wall using wall as target x15reps both directions with cuffs.   Bil LE squats x 15 then single leg x 15 reps each. Able to perform bil without UE assist.  Needed UE support with single leg squats.  Pt requires aquatic therapy for the buoyancy of the water to allow her to off load her body for spinal decompression and also to reduce the weight of her RLE In the water for decreased pain with AROM; buoyancy of water allows more freedom of movement than can be achieved with land exercise  Pt also requires viscosity of water for strengthening; water allows reduced gait deviation due to reduced joint loading through buoyancy which facilitates improvedposture without excess stress & pain    PT Long Term Goals - 03/17/20 1817      PT LONG TERM GOAL #1   Title Pt  will be independent with  aquatic HEP in order to indicate improved functional mobilty and general conditioning.  (all LTG due by 03/14/20)    Time 8    Period Weeks    Status On-going    Target Date 05/16/20      PT LONG TERM GOAL #2   Title Pt will report improved RLE strength and AROM to be able to bring her RLE up to independently put lotion on R lower leg and foot.    Time 8    Period Weeks    Status On-going    Target Date 05/16/20      PT LONG TERM GOAL #3   Title Pt will subjectively report at least 75% improvement in pain in bil upper trap regions to increase independence and ease with self care and ADL's.    Baseline goal upgraded as 50% improvement has been achieved    Time 8    Period Weeks    Status Revised    Target Date 05/16/20      PT LONG TERM GOAL #4   Title Pt will report decreased back pain to </= 4/10 intensity prior to start of aquatic therapy session.  Baseline 5/10 on 02-04-20    Time 8    Period Weeks    Status Revised    Target Date 05/16/20                  Patient will benefit from skilled therapeutic intervention in order to improve the following deficits and impairments:  Decreased balance, Decreased endurance, Decreased mobility, Decreased range of motion, Decreased strength, Impaired flexibility, Impaired sensation, Pain, Obesity, Difficulty walking, Decreased activity tolerance  Visit Diagnosis: Muscle weakness (generalized)  Chronic midline low back pain without sciatica  Unsteadiness on feet  Other abnormalities of gait and mobility     Problem List Patient Active Problem List   Diagnosis Date Noted  . Flat foot 05/07/2019  . Abnormality of gait 02/22/2019  . Lumbosacral radiculopathy at L4 06/09/2018  . Generalized anxiety disorder   . Pain   . Fall   . Labile blood pressure   . Neuropathic pain   . Therapeutic opioid induced constipation   . Urinary retention   . Lumbar radiculopathy 02/21/2018  . PCOS  (polycystic ovarian syndrome)   . Morbid obesity ([place])   . Depression   . Generalized OA   . Postoperative pain   . Acute blood loss anemia   . Status post surgery 02/17/2018  . Lumbar radiculopathy, chronic 02/17/2018  . Primary localized osteoarthritis of right hip 04/21/2016  . Sinusitis 06/25/2015  . Cough 06/25/2015  . Essential hypertension 04/03/2015  . Dehydration 04/03/2015  . CAP (community acquired pneumonia) 03/31/2015  . Right lower lobe pneumonia 03/31/2015  . Fever 03/31/2015  . Pregnancy 04/29/2014  . Miscarriage   . Incomplete miscarriage 04/02/2011    Class: Acute  . Deep vein thrombosis (DVT) ([facility]) 04/02/2011    Class: Chronic    Narda Bonds, [place] 04/21/20 3:57 PM Phone: [phone] Fax: [phone]   10th visit progress note completed by [place] Dild[place], PT 04/21/20 3:58 PM  [facility] [address] [place], [place], [address] Phone: [phone]   Fax:  [phone]  Name: STAR RESLER MRN: [id number] Date of Birth: 22-May-1976

## 2020-04-21 NOTE — Addendum Note (Signed)
Addended by: Lamar Benes on: 04/21/2020 03:51 PM   Modules accepted: Orders

## 2020-04-21 NOTE — Progress Notes (Signed)
   03/17/20 1810  Plan  Clinical Impression Statement Pt met LTG#3, LTG 1 and 4 ongoing and LTG 2 not met due to recent episode of increased back pain where pt was unable to get out of bed or attend therapy.  Pt reports improvement in mobility since last visit and episode of increased back pain several weekends ago.  Continues to work hard during sessions and is motivated to improve pain, balance and mobility.  continues to be challenged by balance activities in pool along with decreased LE ROM.  Does report improvement in shouldertrapezius pain after dry needling session.  Cont per poc.  Personal Factors and Comorbidities Fitness;Past/Current Experience;Comorbidity 2;Time since onset of injury/illness/exacerbation  Comorbidities lumbar radiculopathy, back sx 2017 (discectomy), Lt TKA 01-21-10, Rt TKA 04-21-16, anterior lumbar fusion 02-07-18, lumbar spondylosis, HTN, PCOS, Depression  Examination-Activity Limitations Locomotion Level;Transfers;Carry;Squat;Stand  Examination-Participation Restrictions Cleaning;Community Activity;Laundry;Interpersonal Relationship;Shop;Meal Prep  Pt will benefit from skilled therapeutic intervention in order to improve on the following deficits Decreased balance;Decreased endurance;Decreased mobility;Decreased range of motion;Decreased strength;Impaired flexibility;Impaired sensation;Pain;Obesity;Difficulty walking;Decreased activity tolerance  Stability/Clinical Decision Making Evolving/Moderate complexity  PT Frequency 1x / week  PT Duration 8 weeks (eval + 6 visits)  PT Treatment/Interventions ADLs/Self Care Home Management;Aquatic Therapy;Patient/family education;Dry needling;Manual techniques;Therapeutic exercise  PT Next Visit Plan LTG and recert due on 6/25; aquatic therapy - working also on posterior shoulder/scapular strengthening, work on lifting technique with legs  PT Home Exercise Plan New exercise code - previous code had expired - [id number]  Consulted and  Agree with Plan of Care Patient   

## 2020-04-21 NOTE — Progress Notes (Signed)
   03/17/20 1810  Plan  Clinical Impression Statement Pt met LTG#3, LTG 1 and 4 ongoing and LTG 2 not met due to recent episode of increased back pain where pt was unable to get out of bed or attend therapy.  Pt reports improvement in mobility since last visit and episode of increased back pain several weekends ago.  Continues to work hard during sessions and is motivated to improve pain, balance and mobility.  continues to be challenged by balance activities in pool along with decreased LE ROM.  Does report improvement in shouldertrapezius pain after dry needling session.  Cont per poc.  Personal Factors and Comorbidities Fitness;Past/Current Experience;Comorbidity 2;Time since onset of injury/illness/exacerbation  Comorbidities lumbar radiculopathy, back sx 2017 (discectomy), Lt TKA 01-21-10, Rt TKA 04-21-16, anterior lumbar fusion 02-07-18, lumbar spondylosis, HTN, PCOS, Depression  Examination-Activity Limitations Locomotion Level;Transfers;Carry;Squat;Stand  Examination-Participation Restrictions Cleaning;Community Activity;Laundry;Interpersonal Relationship;Shop;Meal Prep  Pt will benefit from skilled therapeutic intervention in order to improve on the following deficits Decreased balance;Decreased endurance;Decreased mobility;Decreased range of motion;Decreased strength;Impaired flexibility;Impaired sensation;Pain;Obesity;Difficulty walking;Decreased activity tolerance  Stability/Clinical Decision Making Evolving/Moderate complexity  PT Frequency 1x / week  PT Duration 8 weeks (eval + 6 visits)  PT Treatment/Interventions ADLs/Self Care Home Management;Aquatic Therapy;Patient/family education;Dry needling;Manual techniques;Therapeutic exercise  PT Next Visit Plan LTG and recert due on 7/27; aquatic therapy - working also on posterior shoulder/scapular strengthening, work on Scientist, forensic with legs  PT Home Exercise Plan New exercise code - previous code had expired - [email or web address]  Consulted and  Agree with Plan of Care Patient

## 2020-05-14 ENCOUNTER — Ambulatory Visit: Payer: Medicare Other | Admitting: Physical Therapy

## 2020-06-02 ENCOUNTER — Ambulatory Visit: Payer: Medicare Other | Attending: Neurological Surgery | Admitting: Physical Therapy

## 2020-06-10 ENCOUNTER — Other Ambulatory Visit: Payer: Self-pay | Admitting: Surgery

## 2020-06-11 ENCOUNTER — Ambulatory Visit: Payer: Medicare Other | Admitting: Physical Therapy

## 2020-06-20 ENCOUNTER — Ambulatory Visit (HOSPITAL_COMMUNITY)
Admission: RE | Admit: 2020-06-20 | Discharge: 2020-06-20 | Disposition: A | Discharge status: Home / Self Care | Payer: Medicare Other | Source: Ambulatory Visit | Attending: Surgery | Admitting: Surgery

## 2020-06-20 ENCOUNTER — Other Ambulatory Visit: Payer: Self-pay

## 2020-07-04 ENCOUNTER — Other Ambulatory Visit: Payer: Self-pay | Admitting: Orthopedic Surgery

## 2020-07-04 DIAGNOSIS — M25551 Pain in right hip: Secondary | ICD-10-CM

## 2020-07-07 ENCOUNTER — Ambulatory Visit (INDEPENDENT_AMBULATORY_CARE_PROVIDER_SITE_OTHER): Payer: Medicare Other

## 2020-07-07 ENCOUNTER — Other Ambulatory Visit: Payer: Self-pay

## 2020-07-07 ENCOUNTER — Ambulatory Visit: Payer: Medicare Other | Admitting: Podiatry

## 2020-07-07 DIAGNOSIS — M2041 Other hammer toe(s) (acquired), right foot: Secondary | ICD-10-CM | POA: Diagnosis not present

## 2020-07-07 DIAGNOSIS — M2042 Other hammer toe(s) (acquired), left foot: Secondary | ICD-10-CM | POA: Diagnosis not present

## 2020-07-07 DIAGNOSIS — Z9889 Other specified postprocedural states: Secondary | ICD-10-CM | POA: Diagnosis not present

## 2020-07-07 NOTE — Progress Notes (Signed)
      HPI: 44 y.o. female presenting today for evaluation of pain and tenderness to the third and fourth digit of the right foot.  Patient states that although the toes are nice and straight she is getting pain to the tips of the toes.  She believes that it is coming from the orthopedic screws.  She has history of hammertoe repair with internal screw fixation 11/30/2018.  She presents for further treatment evaluation  Past Medical History:  Diagnosis Date  . Arthritis    knees, back  . 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 Complication of anesthesia   . Depression   . History of pneumonia   . Hypertension   . Lumbar spondylosis   . PCOS (polycystic ovarian syndrome)   . Pneumonia    2016  . PONV (postoperative nausea and vomiting)      Physical Exam: General: The patient is alert and oriented x3 in no acute distress.  Dermatology: Skin is warm, dry and supple bilateral lower extremities. Negative for open lesions or macerations.  Vascular: Palpable pedal pulses bilaterally. No edema or erythema noted. Capillary refill within normal limits.  Neurological: Epicritic and protective threshold grossly intact bilaterally.   Musculoskeletal Exam: Range of motion within normal limits to all pedal and ankle joints bilateral. Muscle strength 5/5 in all groups bilateral.  The toes are in good rectus alignment however there is some sensitivity to the tips of the toes which I suspect is coming from the heads of the internal fixation screws  Radiographic Exam:  Normal osseous mineralization. Joint spaces preserved. No fracture/dislocation/boney destruction.  Orthopedic screws to the third and fourth toes are intact.  There is an internal partial K wire within the left second toe.  Asymptomatic.  Assessment: 1.  Symptomatic hardware digits 3, 4 right   Plan of Care:  1. Patient evaluated. X-Rays reviewed.  2. Today we discussed the conservative versus surgical management of the  presenting pathology. The patient opts for surgical management. All possible complications and details of the procedure were explained. All patient questions were answered. No guarantees were expressed or implied. 3. Authorization for surgery was initiated today. Surgery will consist of removal of orthopedic screws 3, 4 right 4.  Return to clinic 1 week postop  *Going to [facility] next month.  Surgery will be after her trip.  Her sister is a patient of mine as well        Edrick Kins, DPM [facility]  Dr. Edrick Kins, DPM    [address], [place] [address]                Office [phone]  Fax [phone]

## 2020-07-08 ENCOUNTER — Other Ambulatory Visit: Payer: Self-pay

## 2020-07-08 ENCOUNTER — Encounter: Payer: Medicare Other | Attending: Surgery | Admitting: Skilled Nursing Facility1

## 2020-07-08 ENCOUNTER — Encounter: Payer: Self-pay | Admitting: Skilled Nursing Facility1

## 2020-07-08 DIAGNOSIS — Z713 Dietary counseling and surveillance: Secondary | ICD-10-CM | POA: Insufficient documentation

## 2020-07-08 DIAGNOSIS — Z79899 Other long term (current) drug therapy: Secondary | ICD-10-CM | POA: Insufficient documentation

## 2020-07-08 DIAGNOSIS — Z6841 Body Mass Index (BMI) 40.0 and over, adult: Secondary | ICD-10-CM | POA: Insufficient documentation

## 2020-07-08 DIAGNOSIS — E669 Obesity, unspecified: Secondary | ICD-10-CM

## 2020-07-08 DIAGNOSIS — I10 Essential (primary) hypertension: Secondary | ICD-10-CM | POA: Diagnosis not present

## 2020-07-08 NOTE — Progress Notes (Signed)
Nutrition Assessment for Bariatric Surgery Medical Nutrition Therapy Appt Start Time: 8:40 End Time: 9:00  Patient was seen on 07/08/2020 for Pre-Operative Nutrition Assessment. Letter of approval faxed to [facility] bariatric surgery program coordinator on at next visit  Referral stated Supervised Weight Loss (SWL) visits needed: 6  Planned surgery: sleeve gastrectomy  Pt expectation of surgery: to lose weight Pt expectation of dietitian: none identified     NUTRITION ASSESSMENT   Anthropometrics  Start weight at [facility]: 298.8 lbs (date: 07/08/2020)  Height: 71.5 in BMI: 41.07 kg/m2     Clinical  Medical hx:  Medications: see list Labs:  Notable signs/symptoms: chronic pain Any previous deficiencies? No  Micronutrient Nutrition Focused Physical Exam: Hair: No issues observed Eyes: No issues observed Mouth: No issues observed Neck: No issues observed Nails: No issues observed Skin: No issues observed  Lifestyle & Dietary Hx  Due to pts abbreviated appt; goals list will be completed at next visit as well as complete letter to send off  Pt states she does not miss basketball but she misses being active.  Pt states she is recently having shoulder pain and her other hip is bothering. Pt states she is especially in pain when the weather is chilly.   24-Hr Dietary Recall First Meal: oatmeal Snack:  Second Meal: sanndwich Snack:  Third Meal: chicken Snack:  Beverages: water   Estimated Energy Needs Calories: 1800  NUTRITION DIAGNOSIS  Overweight/obesity ([place]-3.3) related to past poor dietary habits and physical inactivity as evidenced by patient w/ planned sleeve gastrectomy surgery following dietary guidelines for continued weight loss.    NUTRITION INTERVENTION  Nutrition counseling (C-1) and education (E-2) to facilitate bariatric surgery goals.   Pre-Op Goals Reviewed with the Patient . Track food and beverage intake (pen and paper, MyFitness Pal,  Baritastic app, etc.) . Make healthy food choices while monitoring portion sizes . Consume 3 meals per day or try to eat every 3-5 hours . Avoid concentrated sugars and fried foods . Keep sugar & fat in the single digits per serving on food labels . Practice CHEWING your food (aim for applesauce consistency) . Practice not drinking 15 minutes before, during, and 30 minutes after each meal and snack . Avoid all carbonated beverages (ex: soda, sparkling beverages)  . Limit caffeinated beverages (ex: coffee, tea, energy drinks) . Avoid all sugar-sweetened beverages (ex: regular soda, sports drinks)  . Avoid alcohol  . Aim for 64-100 ounces of FLUID daily (with at least half of fluid intake being plain water)  . Aim for at least 60-80 grams of PROTEIN daily . Look for a liquid protein source that contains ?15 g protein and ?5 g carbohydrate (ex: shakes, drinks, shots) . Make a list of non-food related activities . Physical activity is an important part of a healthy lifestyle so keep it moving! The goal is to reach 150 minutes of exercise per week, including cardiovascular and weight baring activity.  *Goals that are bolded indicate the pt would like to start working towards these  Handouts Provided Include  . Bariatric Surgery handouts (Nutrition Visits, Pre-Op Goals, Protein Shakes, Vitamins & Minerals)  Learning Style & Readiness for Change Teaching method utilized: Visual & Auditory  Demonstrated degree of understanding via: Teach Back  Barriers to learning/adherence to lifestyle change: none identified/chronic pain    RD's Notes for Next Visit . Assess pts adherence to chosen goals     MONITORING & EVALUATION Dietary intake, weekly physical activity, body weight, and pre-op  goals reached at next nutrition visit.    Next Steps  Patient is to follow up at [facility] for Pre-Op Class >2 weeks before surgery for further nutrition education.

## 2020-07-16 ENCOUNTER — Other Ambulatory Visit: Payer: Self-pay

## 2020-07-16 ENCOUNTER — Ambulatory Visit (HOSPITAL_COMMUNITY)
Admission: RE | Admit: 2020-07-16 | Discharge: 2020-07-16 | Disposition: A | Discharge status: Home / Self Care | Payer: Medicare Other | Source: Ambulatory Visit | Attending: Orthopedic Surgery | Admitting: Orthopedic Surgery

## 2020-07-16 ENCOUNTER — Encounter (HOSPITAL_COMMUNITY)
Admission: RE | Admit: 2020-07-16 | Discharge: 2020-07-16 | Disposition: A | Discharge status: Home / Self Care | Payer: Medicare Other | Source: Ambulatory Visit | Attending: Orthopedic Surgery | Admitting: Orthopedic Surgery

## 2020-07-16 DIAGNOSIS — M25551 Pain in right hip: Secondary | ICD-10-CM | POA: Insufficient documentation

## 2020-07-16 MED ORDER — TECHNETIUM TC 99M MEDRONATE IV KIT
21.5000 | PACK | Freq: Once | INTRAVENOUS | Status: AC
  Administered 2020-07-16: 21.5 via INTRAVENOUS

## 2020-07-22 ENCOUNTER — Encounter: Payer: Medicare Other | Attending: Surgery | Admitting: Skilled Nursing Facility1

## 2020-07-22 ENCOUNTER — Other Ambulatory Visit: Payer: Self-pay

## 2020-07-22 DIAGNOSIS — Z6841 Body Mass Index (BMI) 40.0 and over, adult: Secondary | ICD-10-CM | POA: Diagnosis not present

## 2020-07-22 DIAGNOSIS — E282 Polycystic ovarian syndrome: Secondary | ICD-10-CM | POA: Insufficient documentation

## 2020-07-22 DIAGNOSIS — Z713 Dietary counseling and surveillance: Secondary | ICD-10-CM | POA: Diagnosis not present

## 2020-07-22 DIAGNOSIS — I10 Essential (primary) hypertension: Secondary | ICD-10-CM | POA: Insufficient documentation

## 2020-07-22 DIAGNOSIS — E669 Obesity, unspecified: Secondary | ICD-10-CM

## 2020-07-22 NOTE — Progress Notes (Signed)
Supervised Weight Loss Visit Bariatric Nutrition Education  Planned Surgery: sleeve Pt Expectation of Surgery/ Goals: to lose weight  1 out of 6 SWL Appointments   Star given previously: no  NUTRITION ASSESSMENT  Anthropometrics  Start weight at [facility]: 298 lbs (date: 07/08/2020) Today's weight: 301.1 lbs Weight change: +3 lbs (since previous visit on 07/08/2020) BMI: 41.41 kg/m2    Clinical  Medical Hx: HTN, PCOS  Medications: see list Labs:  Notable Signs/Symptoms: chronic pain  Lifestyle & Dietary Hx  Pt states she has been working on chewing well and drinking more water aiming for 1 gallon. Pt states she will eat breakfast if she makes it for daughter stating her daughter has not been eating breakfast.  Estimated daily fluid intake: 20 oz Supplements:  Current average weekly physical activity: ADLs  24-Hr Dietary Recall First Meal: BLT or skipped Snack: Second Meal: frozen meal or sandwich Snack: granola bar or cashews Third Meal: skipped or fast food Snack:   Beverages: water  Estimated Energy Needs Calories: 1600  NUTRITION DIAGNOSIS  Overweight/obesity ([place]-3.3) related to past poor dietary habits and physical inactivity as evidenced by patient w/ planned sleeve gastrectomy surgery following dietary guidelines for continued weight loss.   NUTRITION INTERVENTION  Nutrition counseling (C-1) and education (E-2) to facilitate bariatric surgery goals.  Pre-Op Goals Progress & New Goals . NEW Eat breakfast so try yogurt  . NEW: Log everything you put into your mouth . Continue to aim for 64 fluid ounces per day . Continue: to aim for chewing until applesauce consistency   Handouts Provided Include   Protein supplement list  Pre-op goals  Learning Style & Readiness for Change Teaching method utilized: Visual & Auditory  Demonstrated degree of understanding via: Teach Back  Barriers to learning/adherence to lifestyle change: unknown at this time  RD's  Notes for next Visit  . Assess pts adherence to chosen goals   MONITORING & EVALUATION Dietary intake, weekly physical activity, body weight, and pre-op goals in 1 month.   Next Steps  Patient is to return to [facility] in one month.

## 2020-08-06 ENCOUNTER — Telehealth: Payer: Self-pay

## 2020-08-06 NOTE — Telephone Encounter (Signed)
DOS 08/21/2020  REMOVAL FIXATION DEEP 3,4 RT - 20680  [employer] MEDICARE EFFECTIVE DATE - 05/21/2020  PLAN DEDUCTIBLE - $0.00 OUT OF POCKET - $3900.00 W/ $2243.60 REMAINING  CO-INSURANCE 0% / Day OUTPATIENT SURGERY 0% / [place] $295 / Day OUTPATIENT SURGERY $295 / [place]  Notification or Prior Authorization is not required for the requested services  This [employer] Medicare Advantage members plan does not currently require a prior authorization for these services. If you have general questions about the prior authorization requirements, please call us at [phone] or visit [email or web address] > Clinician Resources > Advance and Admission Notification Requirements. The number above acknowledges your notification. Please write this number down for future reference. Notification is not a guarantee of coverage or payment.  Decision ID #:[id number]

## 2020-08-18 ENCOUNTER — Other Ambulatory Visit: Payer: Self-pay

## 2020-08-18 ENCOUNTER — Ambulatory Visit: Payer: Medicare Other | Admitting: Podiatry

## 2020-08-18 DIAGNOSIS — B351 Tinea unguium: Secondary | ICD-10-CM

## 2020-08-18 DIAGNOSIS — L600 Ingrowing nail: Secondary | ICD-10-CM | POA: Diagnosis not present

## 2020-08-18 DIAGNOSIS — M79674 Pain in right toe(s): Secondary | ICD-10-CM

## 2020-08-18 DIAGNOSIS — M79675 Pain in left toe(s): Secondary | ICD-10-CM | POA: Diagnosis not present

## 2020-08-18 MED ORDER — GENTAMICIN SULFATE 0.1 % EX CREA: 1.0000 "application " | TOPICAL_CREAM | Freq: Two times a day (BID) | CUTANEOUS | 1 refills | Status: DC

## 2020-08-18 MED ORDER — TERBINAFINE HCL 250 MG PO TABS: 250.0000 mg | ORAL_TABLET | Freq: Every day | ORAL | 0 refills | Status: DC

## 2020-08-18 NOTE — Patient Instructions (Signed)
Place 1/4 cup of epsom salts in a quart of warm tap water.  Submerge your foot or feet in the solution and soak for 20 minutes.  This soak should be done twice a day.  Next, remove your foot or feet from solution, blot dry the affected area. Apply ointment and cover if instructed by your doctor.   IF YOUR SKIN BECOMES IRRITATED WHILE USING THESE INSTRUCTIONS, IT IS OKAY TO SWITCH TO  WHITE VINEGAR AND WATER.  As another alternative soak, you may use antibacterial soap and water.  Monitor for any signs/symptoms of infection. Call the office immediately if any occur or go directly to the emergency room. Call with any questions/concerns.  Ingrown Toenail An ingrown toenail occurs when the corner or sides of a toenail grow into the surrounding skin. This causes discomfort and pain. The big toe is most commonly affected, but any of the toes can be affected. If an ingrown toenail is not treated, it can become infected. What are the causes? This condition may be caused by:  Wearing shoes that are too small or tight.  An injury, such as stubbing your toe or having your toe stepped on.  Improper cutting or care of your toenails.  Having nail or foot abnormalities that were present from birth (congenital abnormalities), such as having a nail that is too big for your toe. What increases the risk? The following factors may make you more likely to develop ingrown toenails:  Age. Nails tend to get thicker with age, so ingrown nails are more common among older people.  Cutting your toenails incorrectly, such as cutting them very short or cutting them unevenly. An ingrown toenail is more likely to get infected if you have:  Diabetes.  Blood flow (circulation) problems. What are the signs or symptoms? Symptoms of an ingrown toenail may include:  Pain, soreness, or tenderness.  Redness.  Swelling.  Hardening of the skin that surrounds the toenail. Signs that an ingrown toenail may be infected  include:  Fluid or pus.  Symptoms that get worse instead of better. How is this diagnosed? An ingrown toenail may be diagnosed based on your medical history, your symptoms, and a physical exam. If you have fluid or blood coming from your toenail, a sample may be collected to test for the specific type of bacteria that is causing the infection. How is this treated? Treatment depends on how severe your ingrown toenail is. You may be able to care for your toenail at home.  If you have an infection, you may be prescribed antibiotic medicines.  If you have fluid or pus draining from your toenail, your health care provider may drain it.  If you have trouble walking, you may be given crutches to use.  If you have a severe or infected ingrown toenail, you may need a procedure to remove part or all of the nail. Follow these instructions at home: Foot care   Do not pick at your toenail or try to remove it yourself.  Soak your foot in warm, soapy water. Do this for 20 minutes, 3 times a day, or as often as told by your health care provider. This helps to keep your toe clean and keep your skin soft.  Wear shoes that fit well and are not too tight. Your health care provider may recommend that you wear open-toed shoes while you heal.  Trim your toenails regularly and carefully. Cut your toenails straight across to prevent injury to the skin at the   corners of the toenail. Do not cut your nails in a curved shape.  Keep your feet clean and dry to help prevent infection. Medicines  Take over-the-counter and prescription medicines only as told by your health care provider.  If you were prescribed an antibiotic, take it as told by your health care provider. Do not stop taking the antibiotic even if you start to feel better. Activity  Return to your normal activities as told by your health care provider. Ask your health care provider what activities are safe for you.  Avoid activities that cause  pain. General instructions  If your health care provider told you to use crutches to help you move around, use them as instructed.  Keep all follow-up visits as told by your health care provider. This is important. Contact a health care provider if:  You have more redness, swelling, pain, or other symptoms that do not improve with treatment.  You have fluid, blood, or pus coming from your toenail. Get help right away if:  You have a red streak on your skin that starts at your foot and spreads up your leg.  You have a fever. Summary  An ingrown toenail occurs when the corner or sides of a toenail grow into the surrounding skin. This causes discomfort and pain. The big toe is most commonly affected, but any of the toes can be affected.  If an ingrown toenail is not treated, it can become infected.  Fluid or pus draining from your toenail is a sign of infection. Your health care provider may need to drain it. You may be given antibiotics to treat the infection.  Trimming your toenails regularly and properly can help you prevent an ingrown toenail. This information is not intended to replace advice given to you by your health care provider. Make sure you discuss any questions you have with your health care provider. Document Revised: 12/29/2018 Document Reviewed: 05/25/2017 Elsevier Patient Education  2020 Elsevier Inc.  

## 2020-08-18 NOTE — Progress Notes (Signed)
   Subjective: Patient presents today for evaluation of pain to the lateral border of the left great toe. Patient is concerned for possible ingrown nail. Patient is currently scheduled for removal of orthopedic screws to the right foot. She states that she would like to have this ingrown toenail performed prior to surgical removal of the screws. Patient presents today for further treatment and evaluation.  Past Medical History:  Diagnosis Date  . Arthritis    knees, back  . 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 Complication of anesthesia   . Depression   . History of pneumonia   . Hypertension   . Lumbar spondylosis   . PCOS (polycystic ovarian syndrome)   . Pneumonia    2016  . PONV (postoperative nausea and vomiting)     Objective:  General: Well developed, nourished, in no acute distress, alert and oriented x3   Dermatology: Skin is warm, dry and supple bilateral. Lateral border left great toe appears to be erythematous with evidence of an ingrowing nail. Pain on palpation noted to the border of the nail fold. No clinical evidence of infection or purulent drainage noted. There are no open sores, lesions.  Hyperkeratotic dystrophic discolored nails also noted 1-5 bilateral consistent with onychomycosis  Vascular: Dorsalis Pedis artery and Posterior Tibial artery pedal pulses palpable. No lower extremity edema noted.   Neruologic: Grossly intact via light touch bilateral.  Musculoskeletal: Muscular strength within normal limits in all groups bilateral. Normal range of motion noted to all pedal and ankle joints.   Assesement: #1 Paronychia with ingrowing nail lateral border left great toe without infection #2 Pain in toe #3 Incurvated nail  Plan of Care:  1. Patient evaluated.  2. Discussed treatment alternatives and plan of care. Explained nail avulsion procedure and post procedure course to patient. 3. Patient opted for permanent partial nail avulsion of the  lateral border left great toe.  4. Prior to procedure, local anesthesia infiltration utilized using 3 ml of a 50:50 mixture of 2% plain lidocaine and 0.5% plain marcaine in a normal hallux block fashion and a betadine prep performed.  5. Partial permanent nail avulsion with chemical matrixectomy performed using [id number] applications of phenol followed by alcohol flush.  6. Light dressing applied. 7. Prescription for gentamicin cream  Eight. Prescription for Lamisil 250 mg #90 daily. Patient denies any liver pathology or symptoms  Nine. Return to clinic 1 week postop.  Edrick Kins, DPM [facility]  Dr. Edrick Kins, DPM    [address], [place] [address]                Office [phone]  Fax [phone]

## 2020-08-20 ENCOUNTER — Ambulatory Visit: Payer: Medicare Other | Admitting: Skilled Nursing Facility1

## 2020-08-21 DIAGNOSIS — Z4889 Encounter for other specified surgical aftercare: Secondary | ICD-10-CM | POA: Diagnosis not present

## 2020-08-26 ENCOUNTER — Encounter: Payer: Medicare Other | Attending: Surgery | Admitting: Dietician

## 2020-08-26 ENCOUNTER — Encounter: Payer: Self-pay | Admitting: Dietician

## 2020-08-26 ENCOUNTER — Other Ambulatory Visit: Payer: Self-pay

## 2020-08-26 DIAGNOSIS — Z6841 Body Mass Index (BMI) 40.0 and over, adult: Secondary | ICD-10-CM | POA: Diagnosis not present

## 2020-08-26 DIAGNOSIS — Z713 Dietary counseling and surveillance: Secondary | ICD-10-CM | POA: Diagnosis not present

## 2020-08-26 DIAGNOSIS — I10 Essential (primary) hypertension: Secondary | ICD-10-CM | POA: Diagnosis not present

## 2020-08-26 DIAGNOSIS — E669 Obesity, unspecified: Secondary | ICD-10-CM

## 2020-08-26 NOTE — Progress Notes (Signed)
Supervised Weight Loss Visit Bariatric Nutrition Education  Planned Surgery: Sleeve Pt Expectation of Surgery/ Goals: to lose weight  2 out of 6 SWL Appointments   NUTRITION ASSESSMENT  Anthropometrics  Start weight at [facility]: 298 lbs (date: 07/08/2020) Today's weight: 302.7 lbs Weight change: +1 lbs (since previous visit on 07/22/2020) BMI: 41.6 kg/m2    Clinical  Medical Hx: HTN, PCOS Medications: see list Notable Signs/Symptoms: chronic pain  Lifestyle & Dietary Hx Patient states her physical activity is still very limited because of her hx of surgeries. States she doesn't snack as much as before, however she finds herself eating the food that her 45 year old does not eat at meals. May have steak with broccoli, pizza, or McDonalds. States she went on a cruise a few weeks ago and got out of practicing chewing her food thoroughly. States she is considering seeking medication and diet plans from a medically supervised bariatric clinic.   Estimated daily fluid intake: unknown Supplements: MVI, [place] ACV Current average weekly physical activity: ADLs  24-Hr Dietary Recall First Meal: 2 slices bacon Snack: - Second Meal: - Snack: - Third Meal: cheese pizza Snack:   Beverages: water, hot tea, arnold palmer   Estimated Energy Needs Calories: 1600  NUTRITION DIAGNOSIS  Overweight/obesity ([place]-3.3) related to past poor dietary habits and physical inactivity as evidenced by patient w/ planned sleeve gastrectomy surgery following dietary guidelines for continued weight loss.   NUTRITION INTERVENTION  Nutrition counseling (C-1) and education (E-2) to facilitate bariatric surgery goals.  Pre-Op Goals Progress & New Goals . Continue to practice logging everything you eat . Continue to aim for 64 fluid ounces per day . Continue chewing until applesauce consistency  . NEW: Eat at least 3 times per day (structured meals/snacks)   Handouts Provided Include   Balanced Snacks    Learning Style & Readiness for Change Teaching method utilized: Visual & Auditory  Demonstrated degree of understanding via: Teach Back  Barriers to learning/adherence to lifestyle change: unknown at this time  RD's Notes for next Visit  . Assess pts adherence to chosen goals   MONITORING & EVALUATION Dietary intake, weekly physical activity, body weight, and pre-op goals in 1 month.   Next Steps  Patient is to return to [facility] in 1 month.

## 2020-08-27 ENCOUNTER — Ambulatory Visit (INDEPENDENT_AMBULATORY_CARE_PROVIDER_SITE_OTHER): Payer: Medicare Other

## 2020-08-27 ENCOUNTER — Other Ambulatory Visit: Payer: Self-pay

## 2020-08-27 ENCOUNTER — Ambulatory Visit (INDEPENDENT_AMBULATORY_CARE_PROVIDER_SITE_OTHER): Payer: Medicare Other | Admitting: Podiatry

## 2020-08-27 ENCOUNTER — Encounter: Payer: Self-pay | Admitting: Podiatry

## 2020-08-27 DIAGNOSIS — Z9889 Other specified postprocedural states: Secondary | ICD-10-CM

## 2020-08-27 DIAGNOSIS — T8484XA Pain due to internal orthopedic prosthetic devices, implants and grafts, initial encounter: Secondary | ICD-10-CM

## 2020-08-27 NOTE — Addendum Note (Signed)
Addended by: Lind Guest on: 08/27/2020 10:32 AM   Modules accepted: Orders

## 2020-08-27 NOTE — Progress Notes (Signed)
   Subjective:  Patient presents today status post attempted removal of orthopedic screws third and fourth digit of the right foot.  He third digit would not loosen in order to be removed patient states that she is doing well.  No new complaints at this time.  Minimal pain. DOS: 08/21/2020  Past Medical History:  Diagnosis Date  . Arthritis    knees, back  . 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 Complication of anesthesia   . Depression   . History of pneumonia   . Hypertension   . Lumbar spondylosis   . PCOS (polycystic ovarian syndrome)   . Pneumonia    2016  . PONV (postoperative nausea and vomiting)       Objective/Physical Exam Neurovascular status intact.  Skin incisions appear to be well coapted with sutures intact. No sign of infectious process noted. No dehiscence. No active bleeding noted. Moderate edema noted to the surgical extremity.  Radiographic Exam:  Absence of the screw head noted to the third digit of the surgical foot.  Toes are in rectus alignment.  Assessment: 1. s/p removal of orthopedic screws third and fourth digit right foot.  Failure to remove the orthopedic screw to the third digit right foot. DOS: 08/21/2020   Plan of Care:  1. Patient was evaluated. X-rays reviewed 2.  Explained to the patient that I was able to remove the head of the distal tip of the screw which should alleviate pressure and pain from the tip of the toe.  Unfortunately the screw would not loosen in order to be removed intraoperatively.  Please see operative report 3.  Dressings changed today.  Recommend antibiotic cream and a Band-Aid daily. 4.  Continue postsurgical shoe 5.  Return to clinic in 1 week for suture removal    Edrick Kins, DPM [facility]  Dr. Edrick Kins, DPM    [address], [place] [address]                Office [phone]  Fax [phone]

## 2020-09-03 ENCOUNTER — Ambulatory Visit (INDEPENDENT_AMBULATORY_CARE_PROVIDER_SITE_OTHER): Payer: Medicare Other | Admitting: Podiatry

## 2020-09-03 ENCOUNTER — Other Ambulatory Visit: Payer: Self-pay

## 2020-09-03 DIAGNOSIS — Z9889 Other specified postprocedural states: Secondary | ICD-10-CM

## 2020-09-15 NOTE — Progress Notes (Signed)
   Subjective:  Patient presents today status post attempted removal of orthopedic screws third and fourth digit of the right foot.  DOS: 08/21/2020.  Patient states that she is doing very well.  No new complaints at this time.  Past Medical History:  Diagnosis Date  . Arthritis    knees, back  . 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 Complication of anesthesia   . Depression   . History of pneumonia   . Hypertension   . Lumbar spondylosis   . PCOS (polycystic ovarian syndrome)   . Pneumonia    2016  . PONV (postoperative nausea and vomiting)       Objective/Physical Exam Neurovascular status intact.  Skin incisions appear to be well coapted with sutures intact. No sign of infectious process noted. No dehiscence. No active bleeding noted. Moderate edema noted to the surgical extremity.  Radiographic Exam:  Absence of the screw head noted to the third digit of the surgical foot.  Toes are in rectus alignment.  Assessment: 1. s/p removal of orthopedic screws third and fourth digit right foot.  Failure to remove the orthopedic screw to the third digit right foot. DOS: 08/21/2020   Plan of Care:  1. Patient was evaluated.  2.  Sutures removed today 3.  Patient may transition out of the postsurgical shoes into good supportive sneakers 4.  Return to clinic in 4 weeks to address ankle pain  *Going on a cruise 10/07/2020   Parry Po M. Kiarrah Rausch, DPM [facility]  Dr. Vernella Niznik M. Diavian Furgason, DPM    [address]                                    [place], [place] [address]                Office [phone]  Fax [phone]      

## 2020-09-17 ENCOUNTER — Ambulatory Visit (INDEPENDENT_AMBULATORY_CARE_PROVIDER_SITE_OTHER): Payer: Medicare Other | Admitting: Podiatry

## 2020-09-17 ENCOUNTER — Other Ambulatory Visit: Payer: Self-pay

## 2020-09-17 ENCOUNTER — Ambulatory Visit (INDEPENDENT_AMBULATORY_CARE_PROVIDER_SITE_OTHER): Payer: Medicare Other

## 2020-09-17 DIAGNOSIS — M79673 Pain in unspecified foot: Secondary | ICD-10-CM

## 2020-09-17 DIAGNOSIS — Z9889 Other specified postprocedural states: Secondary | ICD-10-CM

## 2020-09-21 NOTE — Progress Notes (Signed)
   Subjective:  Patient presents today status post attempted removal of orthopedic screws third and fourth digit of the right foot.  DOS: 08/21/2020.  Patient states that she is doing very well.  No new complaints at this time.  Past Medical History:  Diagnosis Date  . Arthritis    knees, back  . 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 Complication of anesthesia   . Depression   . History of pneumonia   . Hypertension   . Lumbar spondylosis   . PCOS (polycystic ovarian syndrome)   . Pneumonia    2016  . PONV (postoperative nausea and vomiting)       Objective/Physical Exam Neurovascular status intact.  Skin incisions appear to be well coapted with sutures intact. No sign of infectious process noted. No dehiscence. No active bleeding noted. Moderate edema noted to the surgical extremity.  Radiographic Exam:  Absence of the screw head noted to the third digit of the surgical foot.  Toes are in rectus alignment.  Assessment: 1. s/p removal of orthopedic screws third and fourth digit right foot.  Failure to remove the orthopedic screw to the third digit right foot. DOS: 08/21/2020   Plan of Care:  1. Patient was evaluated.  2.  Sutures removed today 3.  Patient may transition out of the postsurgical shoes into good supportive sneakers 4.  Return to clinic in 4 weeks to address ankle pain  *Going on a cruise 10/07/2020   Felecia Shelling, DPM [facility]  Dr. Felecia Shelling, DPM    [address] [place], [place] [address]                Office [phone]  Fax [phone]

## 2020-09-22 DIAGNOSIS — G4700 Insomnia, unspecified: Secondary | ICD-10-CM | POA: Diagnosis not present

## 2020-09-22 DIAGNOSIS — M47817 Spondylosis without myelopathy or radiculopathy, lumbosacral region: Secondary | ICD-10-CM | POA: Diagnosis not present

## 2020-09-22 DIAGNOSIS — G894 Chronic pain syndrome: Secondary | ICD-10-CM | POA: Diagnosis not present

## 2020-09-23 ENCOUNTER — Other Ambulatory Visit: Payer: Self-pay

## 2020-09-23 ENCOUNTER — Encounter: Payer: Medicare Other | Attending: Surgery | Admitting: Skilled Nursing Facility1

## 2020-09-23 DIAGNOSIS — Z6841 Body Mass Index (BMI) 40.0 and over, adult: Secondary | ICD-10-CM | POA: Insufficient documentation

## 2020-09-23 DIAGNOSIS — E669 Obesity, unspecified: Secondary | ICD-10-CM

## 2020-09-23 DIAGNOSIS — J3081 Allergic rhinitis due to animal (cat) (dog) hair and dander: Secondary | ICD-10-CM | POA: Diagnosis not present

## 2020-09-23 DIAGNOSIS — Z713 Dietary counseling and surveillance: Secondary | ICD-10-CM | POA: Diagnosis not present

## 2020-09-23 DIAGNOSIS — J301 Allergic rhinitis due to pollen: Secondary | ICD-10-CM | POA: Diagnosis not present

## 2020-09-23 DIAGNOSIS — J3089 Other allergic rhinitis: Secondary | ICD-10-CM | POA: Diagnosis not present

## 2020-09-23 NOTE — Progress Notes (Signed)
Supervised Weight Loss Visit Bariatric Nutrition Education  Planned Surgery: Sleeve Pt Expectation of Surgery/ Goals: to lose weight  3 out of 6 [facility] Appointments   NUTRITION ASSESSMENT  Anthropometrics  Start weight at [facility]: 298 lbs (date: 07/08/2020) Today's weight: 311.6 lbs Weight change: +9 lbs (since previous visit on 07/22/2020) BMI: 42.83 kg/m2    Clinical  Medical Hx: HTN, PCOS Medications: see list Notable Signs/Symptoms: chronic pain  Lifestyle & Dietary Hx Patient states her physical activity is still very limited because of her hx of surgeries. States she doesn't snack as much as before, however she finds herself eating the food that her 46 year old does not eat at meals: Doing much better with this in limiting this action.States she is considering seeking medication and diet plans from a medically supervised bariatric clinic and asked dietitians opinion: Dietitian advised pt if she felt it would help her she could but it may harm her connection with her relationship with food and will shift the focus to only weight which is what she has been doing in the past which may be more short sited than what she wants.   Pt states she has not been paying attention to her meals due to the holiday seasons. Pt states heathy meals are expensive and her 46 year old has been eating everything. Pt states she has been drinking more water and trying to get her 46 year old to drink more water too. Pt states her daughter cries for fast food often which makes it hard for her to make healthy choices.  Barriers pt has identified: financial constraints believing healthy eating is more expensive than unhealthy eating: Dietitian offered grocery shopping strategies and shopping at [employer] handouts and examples of how grocery shopping is more fiscally responsible than eating out; her Marjo Bicker eating habits: Dietitian offered [employer] and division of responsibilities as a resource to help her  make changes for her child.   Estimated daily fluid intake: unknown Supplements: MVI, [place] ACV Current average weekly physical activity: ADLs  24-Hr Dietary Recall First Meal: 2 slices bacon Snack: - Second Meal: - Snack: - Third Meal: cheese pizza Snack:   Beverages: water, hot tea, arnold palmer   Estimated Energy Needs Calories: 1600  NUTRITION DIAGNOSIS  Overweight/obesity ([place]-3.3) related to past poor dietary habits and physical inactivity as evidenced by patient w/ planned sleeve gastrectomy surgery following dietary guidelines for continued weight loss.   NUTRITION INTERVENTION  Nutrition counseling (C-1) and education (E-2) to facilitate bariatric surgery goals.  Pre-Op Goals Progress & New Goals . Continue to practice logging everything you eat . Continue to aim for 64 fluid ounces per day . Continue chewing until applesauce consistency  . NEW: Eat at least 3 times per day (structured meals/snacks)  . NEW: visit [employer] website   Handouts Previously Provided Include   Balanced Snacks   Learning Style & Readiness for Change Teaching method utilized: Visual & Auditory  Demonstrated degree of understanding via: Teach Back  Barriers to learning/adherence to lifestyle change: unknown at this time  RD's Notes for next Visit  . Assess pts adherence to chosen goals   MONITORING & EVALUATION Dietary intake, weekly physical activity, body weight, and pre-op goals in 1 month.   Next Steps  Patient is to return to [facility] in 1 month.

## 2020-09-29 ENCOUNTER — Ambulatory Visit (INDEPENDENT_AMBULATORY_CARE_PROVIDER_SITE_OTHER): Payer: Medicare Other

## 2020-09-29 ENCOUNTER — Other Ambulatory Visit: Payer: Self-pay

## 2020-09-29 ENCOUNTER — Ambulatory Visit (INDEPENDENT_AMBULATORY_CARE_PROVIDER_SITE_OTHER): Payer: Medicare Other | Admitting: Podiatry

## 2020-09-29 DIAGNOSIS — T8484XA Pain due to internal orthopedic prosthetic devices, implants and grafts, initial encounter: Secondary | ICD-10-CM

## 2020-09-29 DIAGNOSIS — Z9889 Other specified postprocedural states: Secondary | ICD-10-CM | POA: Diagnosis not present

## 2020-09-29 DIAGNOSIS — M659 Synovitis and tenosynovitis, unspecified: Secondary | ICD-10-CM

## 2020-09-29 MED ORDER — METHYLPREDNISOLONE 4 MG PO TBPK: ORAL_TABLET | ORAL | 0 refills | Status: DC

## 2020-09-29 NOTE — Progress Notes (Signed)
   Subjective:  Patient presents today status post attempted removal of orthopedic screws third and fourth digit of the right foot.  DOS: 08/21/2020.  Patient states that she is doing very well.  She continues to have ankle pain to the bilateral ankles.  She also has been taking the Lamisil as instructed.  Overall doing well  Past Medical History:  Diagnosis Date  . Arthritis    knees, back  . 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 Complication of anesthesia   . Depression   . History of pneumonia   . Hypertension   . Lumbar spondylosis   . PCOS (polycystic ovarian syndrome)   . Pneumonia    2016  . PONV (postoperative nausea and vomiting)       Objective/Physical Exam Neurovascular status intact.  Skin incisions appear to be well coapted with sutures intact. No sign of infectious process noted. No dehiscence. No active bleeding noted. Moderate edema noted to the surgical extremity.  Radiographic Exam:  Absence of the screw head noted to the third digit of the surgical foot.  Toes are in rectus alignment.  Assessment: 1. s/p removal of orthopedic screws third and fourth digit right foot.  Failure to remove the orthopedic screw to the third digit right foot. DOS: 08/21/2020 2.  Synovitis/DJD bilateral ankles 3.  Onychomycosis of toenails bilateral   Plan of Care:  1. Patient was evaluated.  2. patient states that injections have helped in the past.  Injection of 0.5 cc Celestone Soluspan injected into the bilateral ankle joints 3.  Prescription for Medrol Dosepak 4.  Continue oral Lamisil as prescribed 5.  Return to clinic as needed  *Going on a cruise 10/07/2020   Edrick Kins, DPM [facility]  Dr. Edrick Kins, DPM    [address], [place] [address]                Office [phone]  Fax [phone]

## 2020-09-30 DIAGNOSIS — Z1152 Encounter for screening for COVID-19: Secondary | ICD-10-CM | POA: Diagnosis not present

## 2020-10-02 ENCOUNTER — Ambulatory Visit: Payer: Medicare Other | Admitting: Physical Therapy

## 2020-10-02 DIAGNOSIS — J3081 Allergic rhinitis due to animal (cat) (dog) hair and dander: Secondary | ICD-10-CM | POA: Diagnosis not present

## 2020-10-02 DIAGNOSIS — J3089 Other allergic rhinitis: Secondary | ICD-10-CM | POA: Diagnosis not present

## 2020-10-02 DIAGNOSIS — J301 Allergic rhinitis due to pollen: Secondary | ICD-10-CM | POA: Diagnosis not present

## 2020-10-03 DIAGNOSIS — D509 Iron deficiency anemia, unspecified: Secondary | ICD-10-CM | POA: Diagnosis not present

## 2020-10-03 DIAGNOSIS — I10 Essential (primary) hypertension: Secondary | ICD-10-CM | POA: Diagnosis not present

## 2020-10-04 DIAGNOSIS — Z1152 Encounter for screening for COVID-19: Secondary | ICD-10-CM | POA: Diagnosis not present

## 2020-10-07 DIAGNOSIS — Z03818 Encounter for observation for suspected exposure to other biological agents ruled out: Secondary | ICD-10-CM | POA: Diagnosis not present

## 2020-10-07 DIAGNOSIS — Z20822 Contact with and (suspected) exposure to covid-19: Secondary | ICD-10-CM | POA: Diagnosis not present

## 2020-10-15 ENCOUNTER — Ambulatory Visit (INDEPENDENT_AMBULATORY_CARE_PROVIDER_SITE_OTHER): Payer: Medicare Other | Admitting: Psychology

## 2020-10-15 DIAGNOSIS — F509 Eating disorder, unspecified: Secondary | ICD-10-CM | POA: Diagnosis not present

## 2020-10-15 DIAGNOSIS — Z20822 Contact with and (suspected) exposure to covid-19: Secondary | ICD-10-CM | POA: Diagnosis not present

## 2020-10-16 ENCOUNTER — Ambulatory Visit: Payer: Medicare Other | Admitting: Physical Therapy

## 2020-10-21 ENCOUNTER — Other Ambulatory Visit: Payer: Self-pay

## 2020-10-21 ENCOUNTER — Encounter: Payer: Medicare Other | Attending: Surgery | Admitting: Skilled Nursing Facility1

## 2020-10-21 DIAGNOSIS — E669 Obesity, unspecified: Secondary | ICD-10-CM

## 2020-10-21 DIAGNOSIS — I10 Essential (primary) hypertension: Secondary | ICD-10-CM | POA: Diagnosis not present

## 2020-10-21 DIAGNOSIS — Z713 Dietary counseling and surveillance: Secondary | ICD-10-CM | POA: Diagnosis not present

## 2020-10-21 NOTE — Progress Notes (Addendum)
Supervised Weight Loss Visit Bariatric Nutrition Education  Planned Surgery: Sleeve Pt Expectation of Surgery/ Goals: to lose weight  4 out of 6 SWL Appointments   Appt conducted via telephone: pt verbally understands the limitations of this visit type: pt identified via name and DOB  NUTRITION ASSESSMENT  Anthropometrics  Start weight at [facility]: 298 lbs (date: 07/08/2020) Today's weight: 311.6 lbs Weight change:  BMI:  kg/m2    Clinical  Medical Hx: HTN, PCOS Medications: see list Notable Signs/Symptoms: chronic pain  Lifestyle & Dietary Hx (previous visit) Patient states her physical activity is still very limited because of her hx of surgeries. States she doesn't snack as much as before, however she finds herself eating the food that her 46 year old does not eat at meals: Doing much better with this in limiting this action.States she is considering seeking medication and diet plans from a medically supervised bariatric clinic and asked dietitians opinion: Dietitian advised pt if she felt it would help her she could but it may harm her connection with her relationship with food and will shift the focus to only weight which is what she has been doing in the past which may be more short sited than what she wants.   (previous visit) Pt states she has not been paying attention to her meals due to the holiday seasons. Pt states heathy meals are expensive and her 46 year old has been eating everything. Pt states she has been drinking more water and trying to get her 46 year old to drink more water too. Pt states her daughter cries for fast food often which makes it hard for her to make healthy choices.   (previous visit) Barriers pt has identified: financial constraints believing healthy eating is more expensive than unhealthy eating: Dietitian offered grocery shopping strategies and shopping at [employer] handouts and examples of how grocery shopping is more fiscally responsible than eating  out; her [name] eating habits: Dietitian offered [employer] and division of responsibilities as a resource to help her make changes for her child.   Pt states she was diagnosed with Covid-19 feeling very tired and having a cough. Pt states she cannot see her children so staying with her friend so is stressed out. Pt states to be up front she did not go overboard with eating during her week long trip and tried to make healthier choices.   Estimated daily fluid intake: unknown Supplements: MVI, Goli ACV Current average weekly physical activity: ADLs  24-Hr Dietary Recall First Meal: 2 slices bacon or leftover chinese food or cereal or skipped Snack: - Second Meal: banana nut muffin or fast food Snack: - Third Meal: cheese pizza or half steak and creamed spinach Snack:   Beverages: water, hot tea, arnold palmer   Estimated Energy Needs Calories: 1600  NUTRITION DIAGNOSIS  Overweight/obesity ([place]-3.3) related to past poor dietary habits and physical inactivity as evidenced by patient w/ planned sleeve gastrectomy surgery following dietary guidelines for continued weight loss.   NUTRITION INTERVENTION  Nutrition counseling (C-1) and education (E-2) to facilitate bariatric surgery goals.  Pre-Op Goals Progress & New Goals . Continue to practice logging everything you eat . Continue to aim for 64 fluid ounces per day . Continue chewing until applesauce consistency  . NEW/continued: Eat at least 3 times per day (structured meals/snacks)  . NEW/continued: visit [employer] Previously Provided Include   Balanced Snacks   Learning Style & Readiness for Change Teaching method  utilized: Optician, dispensing  Demonstrated degree of understanding via: Teach Back  Barriers to learning/adherence to lifestyle change: unknown at this time  RD's Notes for next Visit  . Assess pts adherence to chosen goals   MONITORING & EVALUATION Dietary intake, weekly  physical activity, body weight, and pre-op goals in 1 month.   Next Steps  Patient is to return to [facility] in 1 month.

## 2020-10-24 DIAGNOSIS — J301 Allergic rhinitis due to pollen: Secondary | ICD-10-CM | POA: Diagnosis not present

## 2020-10-24 DIAGNOSIS — J3081 Allergic rhinitis due to animal (cat) (dog) hair and dander: Secondary | ICD-10-CM | POA: Diagnosis not present

## 2020-10-24 DIAGNOSIS — J3089 Other allergic rhinitis: Secondary | ICD-10-CM | POA: Diagnosis not present

## 2020-10-27 DIAGNOSIS — J3089 Other allergic rhinitis: Secondary | ICD-10-CM | POA: Diagnosis not present

## 2020-10-27 DIAGNOSIS — J301 Allergic rhinitis due to pollen: Secondary | ICD-10-CM | POA: Diagnosis not present

## 2020-10-27 DIAGNOSIS — J3081 Allergic rhinitis due to animal (cat) (dog) hair and dander: Secondary | ICD-10-CM | POA: Diagnosis not present

## 2020-10-28 DIAGNOSIS — F509 Eating disorder, unspecified: Secondary | ICD-10-CM | POA: Diagnosis not present

## 2020-10-29 ENCOUNTER — Ambulatory Visit (INDEPENDENT_AMBULATORY_CARE_PROVIDER_SITE_OTHER): Payer: Medicare Other | Admitting: Psychology

## 2020-10-29 DIAGNOSIS — F509 Eating disorder, unspecified: Secondary | ICD-10-CM

## 2020-10-30 DIAGNOSIS — I10 Essential (primary) hypertension: Secondary | ICD-10-CM | POA: Diagnosis not present

## 2020-10-30 DIAGNOSIS — D509 Iron deficiency anemia, unspecified: Secondary | ICD-10-CM | POA: Diagnosis not present

## 2020-10-30 DIAGNOSIS — G8929 Other chronic pain: Secondary | ICD-10-CM | POA: Diagnosis not present

## 2020-10-30 DIAGNOSIS — G4700 Insomnia, unspecified: Secondary | ICD-10-CM | POA: Diagnosis not present

## 2020-10-30 DIAGNOSIS — E7800 Pure hypercholesterolemia, unspecified: Secondary | ICD-10-CM | POA: Diagnosis not present

## 2020-11-03 ENCOUNTER — Ambulatory Visit: Payer: Medicare Other | Admitting: Physical Therapy

## 2020-11-06 DIAGNOSIS — J3089 Other allergic rhinitis: Secondary | ICD-10-CM | POA: Diagnosis not present

## 2020-11-06 DIAGNOSIS — J301 Allergic rhinitis due to pollen: Secondary | ICD-10-CM | POA: Diagnosis not present

## 2020-11-06 DIAGNOSIS — J3081 Allergic rhinitis due to animal (cat) (dog) hair and dander: Secondary | ICD-10-CM | POA: Diagnosis not present

## 2020-11-13 DIAGNOSIS — J3081 Allergic rhinitis due to animal (cat) (dog) hair and dander: Secondary | ICD-10-CM | POA: Diagnosis not present

## 2020-11-13 DIAGNOSIS — J301 Allergic rhinitis due to pollen: Secondary | ICD-10-CM | POA: Diagnosis not present

## 2020-11-13 DIAGNOSIS — J3089 Other allergic rhinitis: Secondary | ICD-10-CM | POA: Diagnosis not present

## 2020-11-18 DIAGNOSIS — M47817 Spondylosis without myelopathy or radiculopathy, lumbosacral region: Secondary | ICD-10-CM | POA: Diagnosis not present

## 2020-11-18 DIAGNOSIS — G894 Chronic pain syndrome: Secondary | ICD-10-CM | POA: Diagnosis not present

## 2020-11-18 DIAGNOSIS — G4700 Insomnia, unspecified: Secondary | ICD-10-CM | POA: Diagnosis not present

## 2020-11-20 ENCOUNTER — Ambulatory Visit: Payer: Medicare Other | Admitting: Skilled Nursing Facility1

## 2020-11-20 ENCOUNTER — Encounter: Payer: Medicare Other | Attending: Surgery | Admitting: Skilled Nursing Facility1

## 2020-11-20 DIAGNOSIS — I10 Essential (primary) hypertension: Secondary | ICD-10-CM | POA: Insufficient documentation

## 2020-11-20 DIAGNOSIS — E669 Obesity, unspecified: Secondary | ICD-10-CM

## 2020-11-20 NOTE — Progress Notes (Signed)
Supervised Weight Loss Visit Bariatric Nutrition Education  Planned Surgery: Sleeve Pt Expectation of Surgery/ Goals: to lose weight  5 out of 6 SWL Appointments   Appt conducted via virtuale: pt verbally understands the limitations of this visit type: pt identified via name and DOB  NUTRITION ASSESSMENT  Anthropometrics  Start weight at [facility]: 298 lbs (date: 07/08/2020) Today's weight: 295 lbs (pt reported: virtual visit) BMI: 40.57 kg/m2    Clinical  Medical Hx: HTN, PCOS Medications: see list Notable Signs/Symptoms: chronic pain  Lifestyle & Dietary Hx   Pt states Her daughter is at home with very high energy which has been stressful. Pt states her son has covid now but she is recovered. Pt states she has been doing a high protein diet through [facility]. Pt states she is really proud she stops finishing her daughters food and drinking more water; working on a can attitude and not a cannot attitude.   Today's visit was conversation about varity of foods and how to create balanced meals with specific examples.   Estimated daily fluid intake: unknown Supplements: MVI, Goli ACV Current average weekly physical activity: ADLs  24-Hr Dietary Recall First Meal: egg white + canadian bacon Snack: cheese + sugar free jello Second Meal: banana nut muffin or fast food Snack: cheese + sugar free jello Third Meal: spinach + salmon Snack:   Beverages: 1 gallon water, hot tea, arnold palmer   Estimated Energy Needs Calories: 1600  NUTRITION DIAGNOSIS  Overweight/obesity ([place]-3.3) related to past poor dietary habits and physical inactivity as evidenced by patient w/ planned sleeve gastrectomy surgery following dietary guidelines for continued weight loss.   NUTRITION INTERVENTION  Nutrition counseling (C-1) and education (E-2) to facilitate bariatric surgery goals.  Pre-Op Goals Progress & New Goals . Continue to practice logging everything you eat . Continue to aim for 64  fluid ounces per day . Continue chewing until applesauce consistency  . continued: Eat at least 3 times per day (structured meals/snacks)  . continued: visit [employer]  . NEW: try brown and white rice put together to incorporate a whole grain . NEW:Use the bariatric MyPlate to create meals  Handouts Previously Provided Include   Balanced Snacks   bariatric MyPlate  Learning Style & Readiness for Change Teaching method utilized: Visual & Auditory  Demonstrated degree of understanding via: Teach Back  Barriers to learning/adherence to lifestyle change: unknown at this time  RD's Notes for next Visit  . Assess pts adherence to chosen goals   MONITORING & EVALUATION Dietary intake, weekly physical activity, body weight, and pre-op goals in 1 month.   Next Steps  Patient is to return to [facility] in 1 month.

## 2020-11-21 DIAGNOSIS — J301 Allergic rhinitis due to pollen: Secondary | ICD-10-CM | POA: Diagnosis not present

## 2020-11-21 DIAGNOSIS — J3081 Allergic rhinitis due to animal (cat) (dog) hair and dander: Secondary | ICD-10-CM | POA: Diagnosis not present

## 2020-11-21 DIAGNOSIS — J3089 Other allergic rhinitis: Secondary | ICD-10-CM | POA: Diagnosis not present

## 2020-11-24 DIAGNOSIS — J3081 Allergic rhinitis due to animal (cat) (dog) hair and dander: Secondary | ICD-10-CM | POA: Diagnosis not present

## 2020-11-24 DIAGNOSIS — J301 Allergic rhinitis due to pollen: Secondary | ICD-10-CM | POA: Diagnosis not present

## 2020-11-24 DIAGNOSIS — J3089 Other allergic rhinitis: Secondary | ICD-10-CM | POA: Diagnosis not present

## 2020-12-01 DIAGNOSIS — J301 Allergic rhinitis due to pollen: Secondary | ICD-10-CM | POA: Diagnosis not present

## 2020-12-01 DIAGNOSIS — J3089 Other allergic rhinitis: Secondary | ICD-10-CM | POA: Diagnosis not present

## 2020-12-01 DIAGNOSIS — J3081 Allergic rhinitis due to animal (cat) (dog) hair and dander: Secondary | ICD-10-CM | POA: Diagnosis not present

## 2020-12-04 ENCOUNTER — Encounter: Payer: Self-pay | Admitting: Physical Therapy

## 2020-12-04 ENCOUNTER — Ambulatory Visit: Payer: Medicare Other | Attending: Physical Medicine and Rehabilitation | Admitting: Physical Therapy

## 2020-12-04 ENCOUNTER — Other Ambulatory Visit: Payer: Self-pay

## 2020-12-04 DIAGNOSIS — M6281 Muscle weakness (generalized): Secondary | ICD-10-CM | POA: Diagnosis present

## 2020-12-04 DIAGNOSIS — M545 Low back pain, unspecified: Secondary | ICD-10-CM | POA: Diagnosis not present

## 2020-12-04 DIAGNOSIS — G8929 Other chronic pain: Secondary | ICD-10-CM | POA: Insufficient documentation

## 2020-12-04 DIAGNOSIS — R2689 Other abnormalities of gait and mobility: Secondary | ICD-10-CM | POA: Insufficient documentation

## 2020-12-04 NOTE — Patient Instructions (Signed)
  Aquatic Therapy: What to Expect!  Where:  [facility] at [facility] [address] [place], [place]  [address] [phone]  NOTE:  You will receive an automated phone message reminding you of your appointment and it will say the appointment is at the [facility] on [address]  We are working to fix this- just know that you will meet [place] at the pool!  How to Prepare: . Please make sure you drink 8 ounces of water about one hour prior to your pool session . A caregiver MUST attend the entire session with the patient.  The caregiver will be responsible for assisting with dressing as well as any toileting needs.  If the patient will be doing a home program this should likely be the person who will assist as well.  . Patients must wear either their street shoes or pool shoes until they are ready to enter the pool with the therapist.  Patients must also wear either street shoes or pool shoes once exiting the pool to walk to the locker room.  This will helps [place] prevent slips and falls.  . Please arrive 15 minutes early to prepare for your pool therapy session . Sign in at the front desk on the clipboard marked for [place] . You may use the locker rooms on your right and then enter directly into the recreation pool (NOT the competition pool) . Please make sure to attend to any toileting needs prior to entering the pool . Please be dressed in your swim suit and on the pool deck at least 5 minutes before your appointment . Once on the pool deck your therapist will ask you to sign the Patient  Consent and Assignment of Benefits form . Your therapist may take your blood pressure prior to, during and after your session if indicated  About the pool  and parking: 1. Entering the pool Your therapist will assist you; there are 2 ways to enter:  stairs with railings or with a chair lift.   Your therapist will determine the most appropriate way for you. 2. Water temperature is usually  between 86-87 degrees 3. There may be other swimmers in the pool at the same time 4. Parking is free.   Contact Info:     Appointments: [facility]  All sessions are 45 minutes   [address]     Please call the [facility] if   [place], [place]   [address]    you need to cancel or reschedule an appointment.  [phone]

## 2020-12-05 NOTE — Therapy (Signed)
[facility] [address] [place], [place], [address] Phone: [phone]   Fax:  [phone]  Physical Therapy Evaluation  Patient Details  Name: Ashley [name] MRN: [id number] Date of Birth: [birth year] Referring Provider (PT): Maude Leriche, PA-C   Encounter Date: 12/04/2020   PT End of Session - 12/05/20 1556    Visit Number 1    Number of Visits 7   eval + 6 aquatic visits   Authorization Type [employer] medicare    Authorization Time Period 12-04-20 - 02-17-21    PT Start Time 1150    PT Stop Time 1225    PT Time Calculation (min) 35 min    Activity Tolerance Patient tolerated treatment well    Behavior During Therapy [facility] for tasks assessed/performed           Past Medical History:  Diagnosis Date   Arthritis    knees, back   Carpal tunnel syndrome of right wrist 40/9735   Complication of anesthesia    Depression    History of pneumonia    Hypertension    Lumbar spondylosis    PCOS (polycystic ovarian syndrome)    Pneumonia    2016   PONV (postoperative nausea and vomiting)     Past Surgical History:  Procedure Laterality Date   ABDOMINAL EXPOSURE N/A 02/17/2018   Procedure: ABDOMINAL EXPOSURE;  Surgeon: Rosetta Posner, MD;  [facility];  Service: Vascular;  Laterality: N/A;   ANKLE SURGERY     x 2   ANTERIOR LAT LUMBAR FUSION N/A 02/17/2018   Procedure: Lumbar three-four Lumbar four-five  Anterolateral lumbar interbody fusion;  Surgeon: Kristeen Miss, MD;  Location: [place];  Service: Neurosurgery;  Laterality: N/A;   ANTERIOR LUMBAR FUSION N/A 02/17/2018   Procedure: Lumbar five-Sacral one Anterior lumbar interbody fusion;  Surgeon: Kristeen Miss, MD;  Location: [place];  Service: Neurosurgery;  Laterality: N/A;   APPLICATION OF ROBOTIC ASSISTANCE FOR SPINAL PROCEDURE N/A 02/17/2018   Procedure: APPLICATION OF ROBOTIC ASSISTANCE FOR SPINAL PROCEDURE;  Surgeon: Kristeen Miss, MD;  Location: [place];  Service: Neurosurgery;  Laterality: N/A;   APPLICATION OF ROBOTIC ASSISTANCE FOR SPINAL PROCEDURE N/A 06/09/2018   Procedure: APPLICATION OF ROBOTIC ASSISTANCE FOR SPINAL PROCEDURE;  Surgeon: Kristeen Miss, MD;  Location: [place];  Service: Neurosurgery;  Laterality: N/A;   BACK SURGERY     2017  discectomy   CARPAL TUNNEL RELEASE Left 07/04/2014   Procedure: LEFT CARPAL TUNNEL RELEASE;  Surgeon: Ninetta Lights, MD;  Location: [place];  Service: Orthopedics;  Laterality: Left;   CARPAL TUNNEL RELEASE Right 08/08/2014   Procedure: RIGHT CARPAL TUNNEL RELEASE;  Surgeon: Ninetta Lights, MD;  Location: [facility];  Service: Orthopedics;  Laterality: Right;   DILATION AND CURETTAGE OF UTERUS     DILATION AND EVACUATION  04/02/2011   Procedure: DILATATION AND EVACUATION (D&E);  Surgeon: Luz Lex, MD;  Location: [facility];  Service: Gynecology;  Laterality: N/A;  dvt left mid thigh   DORSAL COMPARTMENT RELEASE Left 07/04/2014   Procedure: LEFT DEQUERVAINS;  Surgeon: Ninetta Lights, MD;  Location: [place];  Service: Orthopedics;  Laterality: Left;   ENDOSCOPIC PLANTAR FASCIOTOMY Left 02/01/2002   HARDWARE REMOVAL Right 06/09/2018   Procedure: Repositioning of Right Lumbar three Right lumbar  four pedicle screw with MAZOR;  Surgeon: Kristeen Miss, MD;  Location: [facility];  Service: Neurosurgery;  Laterality: Right;   HYSTEROSCOPY WITH  D & C  07/17/2010   with exc. endometrial polyps   KNEE ARTHROSCOPY Right 03/07/2003; 01/14/2005; 08/31/2006   KNEE ARTHROSCOPY Left 03/21/2003; 11/26/2004   LUMBAR PERCUTANEOUS PEDICLE SCREW 3 LEVEL N/A 02/17/2018   Procedure: LUMBAR PERCUTANEOUS PEDICLE SCREW PLACEMENT LUMBAR THREE-SACRAL ONE;  Surgeon: Kristeen Miss, MD;  Location: [facility];  Service: Neurosurgery;  Laterality: N/A;   SHOULDER ARTHROSCOPY Right    TOE SURGERY Left 01/10/2003   claw toe correction 2nd, 3rd, 4th toes   TOTAL HIP ARTHROPLASTY  Right 04/21/2016   TOTAL HIP ARTHROPLASTY Right 04/21/2016   Procedure: TOTAL HIP ARTHROPLASTY ANTERIOR APPROACH;  Surgeon: Ninetta Lights, MD;  Location: [place];  Service: Orthopedics;  Laterality: Right;   TOTAL KNEE ARTHROPLASTY Left 01/21/2010   TOTAL KNEE ARTHROPLASTY Right 08/26/2008    There were no vitals filed for this visit.        [facility] PT Assessment - 12/05/20 0001      Assessment   Medical Diagnosis Lumbar Spondylosis/Radiculopathy    Referring Provider (PT) Maude Leriche, PA-C    Onset Date/Surgical Date --   2017 hip surgery; 2019 lumbar fusion   Prior Therapy pt had aquatic therapy with this facility in May-July 2021      Precautions   Precautions None      Balance Screen   Has the patient fallen in the past 6 months No    Has the patient had a decrease in activity level because of a fear of falling?  No    Is the patient reluctant to leave their home because of a fear of falling?  No      Prior Function   Level of Independence Independent      ROM / Strength   AROM / PROM / Strength Strength      Strength   Overall Strength Deficits    Overall Strength Comments LLE is WNL's for strength    Strength Assessment Site Hip;Knee;Ankle    Right/Left Hip Right    Right Hip Flexion 3+/5    Right/Left Knee Right    Right Knee Flexion 4-/5    Right Knee Extension 4/5    Right/Left Ankle Right    Right Ankle Dorsiflexion 5/5      Transfers   Transfers Sit to Stand    Sit to Stand 6: Modified independent (Device/Increase time)    Comments pt able to perform without UE support but reports some discomfort in her back when standing without UE support      Ambulation/Gait   Ambulation/Gait Yes    Assistive device None    Ambulation Surface Level;Indoor    Gait velocity 9.46= 3.47 ft/sec    Stairs Yes    Stairs Assistance 6: Modified independent (Device/Increase time)    Stair Management Technique One rail Left;Alternating pattern;Forwards    Number of  Stairs 4    Height of Stairs 6      Standardized Balance Assessment   Standardized Balance Assessment Timed Up and Go Test      Timed Up and Go Test   Normal TUG (seconds) 10.12                      Objective measurements completed on examination: See above findings.               PT Education - 12/05/20 1555    Education Details pt given sheet on aquatic therapy at [facility]) Educated Patient  Methods Explanation;Handout    Comprehension Verbalized understanding;Returned demonstration               PT Long Term Goals - 12/05/20 2011      PT LONG TERM GOAL #1   Title Pt will be independent with  aquatic HEP in order to indicate improved functional mobilty and general conditioning.    Time 6    Period Weeks    Status New    Target Date 01/23/21      PT LONG TERM GOAL #2   Title Pt will report improved RLE strength and AROM to be able to bring her RLE up to independently put lotion on R lower leg and foot.    Time 6    Period Weeks    Status New    Target Date 01/23/21      PT LONG TERM GOAL #3   Title Pt will demonstrate improved endurance by participating in 45" aquatic therapy session with minimal c/o fatigue.    Time 6    Period Weeks    Status New    Target Date 01/23/21      PT LONG TERM GOAL #4   Title Pt will report decreased back pain to </= 2/10 intensity prior to start of aquatic therapy session.    Time 6    Period Weeks    Status New    Target Date 01/23/21                  Plan - 12/05/20 1557    Clinical Impression Statement Pt is a 45 yr old lady with lumbar spondylosis and radiculopathy with c/o moderate back pain.  Pt reports greater difficulty in lifting RLE and with ability to bend over and reach her lower legs, in performing self care activities such as rubbing lotion on her legs and feet and also with donning shoes and socks.  Pt reports constant low back pain and rates pain intensity 3/10 at  time of eval.  Pt has decreased strength in RLE, primarily hip musculature and demonstrates decreased activity tolerance and endurance due to c/o LBP with weight bearing activities.  Pt is unable to tolerate exercise on land and is requesting aquatic PT only in order to exercise with less stress on joints and without effects of gravity with weight bearing.  Plan is for patient to participate in 6 aquatic PT visits.    Personal Factors and Comorbidities Fitness;Comorbidity 1;Time since onset of injury/illness/exacerbation;Past/Current Experience    Comorbidities lumbar spondylosis, lumbosacral radiculopathy L4, generalized OA    Examination-Activity Limitations Locomotion Level;Bathing;Bend;Dressing;Stand;Stairs;Squat    Examination-Participation Restrictions Cleaning;Community Activity;Laundry;Shop;Interpersonal Relationship    Stability/Clinical Decision Making Stable/Uncomplicated    Clinical Decision Making Low    Rehab Potential Good    PT Frequency 1x / week    PT Duration 6 weeks   + eval for 7 visits total   PT Treatment/Interventions Aquatic Therapy    PT Next Visit Plan begin aquatic therapy    Consulted and Agree with Plan of Care Patient           Patient will benefit from skilled therapeutic intervention in order to improve the following deficits and impairments:  Difficulty walking,Pain,Decreased strength,Decreased endurance,Decreased activity tolerance,Impaired flexibility  Visit Diagnosis: Chronic right-sided low back pain without sciatica - Plan: PT plan of care cert/re-cert  Muscle weakness (generalized) - Plan: PT plan of care cert/re-cert  Other abnormalities of gait and mobility - Plan: PT plan of care cert/re-cert  Problem List Patient Active Problem List   Diagnosis Date Noted   Flat foot 05/07/2019   Abnormality of gait 02/22/2019   Lumbosacral radiculopathy at L4 06/09/2018   Generalized anxiety disorder    Pain    Fall    Labile blood pressure     Neuropathic pain    Therapeutic opioid induced constipation    Urinary retention    Lumbar radiculopathy 02/21/2018   PCOS (polycystic ovarian syndrome)    Obesity    Depression    Generalized OA    Postoperative pain    Acute blood loss anemia    Status post surgery 02/17/2018   Lumbar radiculopathy, chronic 02/17/2018   Primary localized osteoarthritis of right hip 04/21/2016   Sinusitis 06/25/2015   Cough 06/25/2015   Essential hypertension 04/03/2015   Dehydration 04/03/2015   CAP (community acquired pneumonia) 03/31/2015   Right lower lobe pneumonia 03/31/2015   Fever 03/31/2015   Pregnancy 04/29/2014   Miscarriage    Incomplete miscarriage 04/02/2011    Class: Acute   Deep vein thrombosis (DVT) ([place]) 04/02/2011    Class: Chronic    Danitra Payano, [place], PT 12/05/2020, 8:19 PM  [facility] [address] [place], [place], [address] Phone: [phone]   Fax:  [phone]  Name: Ashley [name] MRN: [id number] Date of Birth: [birth year]

## 2020-12-08 DIAGNOSIS — I8311 Varicose veins of right lower extremity with inflammation: Secondary | ICD-10-CM | POA: Diagnosis not present

## 2020-12-08 DIAGNOSIS — J3089 Other allergic rhinitis: Secondary | ICD-10-CM | POA: Diagnosis not present

## 2020-12-08 DIAGNOSIS — J301 Allergic rhinitis due to pollen: Secondary | ICD-10-CM | POA: Diagnosis not present

## 2020-12-08 DIAGNOSIS — J3081 Allergic rhinitis due to animal (cat) (dog) hair and dander: Secondary | ICD-10-CM | POA: Diagnosis not present

## 2020-12-08 DIAGNOSIS — I8312 Varicose veins of left lower extremity with inflammation: Secondary | ICD-10-CM | POA: Diagnosis not present

## 2020-12-10 ENCOUNTER — Encounter: Payer: Self-pay | Admitting: Physical Therapy

## 2020-12-10 ENCOUNTER — Other Ambulatory Visit: Payer: Self-pay

## 2020-12-10 ENCOUNTER — Ambulatory Visit: Payer: Medicare Other | Admitting: Physical Therapy

## 2020-12-10 DIAGNOSIS — R2689 Other abnormalities of gait and mobility: Secondary | ICD-10-CM

## 2020-12-10 DIAGNOSIS — G8929 Other chronic pain: Secondary | ICD-10-CM | POA: Diagnosis not present

## 2020-12-10 DIAGNOSIS — M545 Low back pain, unspecified: Secondary | ICD-10-CM

## 2020-12-10 DIAGNOSIS — M6281 Muscle weakness (generalized): Secondary | ICD-10-CM

## 2020-12-10 NOTE — Therapy (Signed)
[facility] [address] [place], [place], [address] Phone: [phone]   Fax:  [phone]  Physical Therapy Treatment  Patient Details  Name: Ashley [name] MRN: [id number] Date of Birth: March 20, 1976 Referring Provider (PT): Maude Leriche, [place]   Encounter Date: 12/10/2020   PT End of Session - 12/10/20 2256    Visit Number 2    Number of Visits 7   eval + 6 aquatic visits   Authorization Type U[employer] medicare    Authorization Time Period 12-04-20 - 02-17-21    PT Start Time 1100    PT Stop Time 1145    PT Time Calculation (min) 45 min    Equipment Utilized During Treatment Other (comment)   arm bar bells   Activity Tolerance Patient tolerated treatment well    Behavior During Therapy [facility] for tasks assessed/performed           Past Medical History:  Diagnosis Date  . Arthritis    knees, back  . 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 Complication of anesthesia   . Depression   . History of pneumonia   . Hypertension   . Lumbar spondylosis   . PCOS (polycystic ovarian syndrome)   . Pneumonia    2016  . PONV (postoperative nausea and vomiting)     Past Surgical History:  Procedure Laterality Date  . ABDOMINAL EXPOSURE N/A 02/17/2018   Procedure: ABDOMINAL EXPOSURE;  Surgeon: Rosetta Posner, MD;  Location: [facility];  Service: Vascular;  Laterality: N/A;  . ANKLE SURGERY     x 2  . ANTERIOR LAT LUMBAR FUSION N/A 02/17/2018   Procedure: Lumbar three-four Lumbar four-five  Anterolateral lumbar interbody fusion;  Surgeon: Kristeen Miss, MD;  Location: [facility];  Service: Neurosurgery;  Laterality: N/A;  . ANTERIOR LUMBAR FUSION N/A 02/17/2018   Procedure: Lumbar five-Sacral one Anterior lumbar interbody fusion;  Surgeon: Kristeen Miss, MD;  Location: [place];  Service: Neurosurgery;  Laterality: N/A;  . APPLICATION OF ROBOTIC ASSISTANCE FOR SPINAL PROCEDURE N/A 02/17/2018   Procedure: APPLICATION OF ROBOTIC  ASSISTANCE FOR SPINAL PROCEDURE;  Surgeon: Kristeen Miss, MD;  Location: [place];  Service: Neurosurgery;  Laterality: N/A;  . APPLICATION OF ROBOTIC ASSISTANCE FOR SPINAL PROCEDURE N/A 06/09/2018   Procedure: APPLICATION OF ROBOTIC ASSISTANCE FOR SPINAL PROCEDURE;  Surgeon: Kristeen Miss, MD;  Location: [facility];  Service: Neurosurgery;  Laterality: N/A;  . BACK SURGERY     2017  discectomy  . CARPAL TUNNEL RELEASE Left 07/04/2014   Procedure: LEFT CARPAL TUNNEL RELEASE;  Surgeon: Ninetta Lights, MD;  Location: [facility];  Service: Orthopedics;  Laterality: Left;  . CARPAL TUNNEL RELEASE Right 08/08/2014   Procedure: RIGHT CARPAL TUNNEL RELEASE;  Surgeon: Ninetta Lights, MD;  Location: [facility];  Service: Orthopedics;  Laterality: Right;  . DILATION AND CURETTAGE OF UTERUS    . DILATION AND EVACUATION  04/02/2011   Procedure: DILATATION AND EVACUATION (D&E);  Surgeon: Luz Lex, MD;  Location: [facility];  Service: Gynecology;  Laterality: N/A;  dvt left mid thigh  . DORSAL COMPARTMENT RELEASE Left 07/04/2014   Procedure: LEFT DEQUERVAINS;  Surgeon: Ninetta Lights, MD;  Location: [place];  Service: Orthopedics;  Laterality: Left;  . ENDOSCOPIC PLANTAR FASCIOTOMY Left 02/01/2002  . HARDWARE REMOVAL Right 06/09/2018   Procedure: Repositioning of Right Lumbar three Right lumbar  four pedicle screw with MAZOR;  Surgeon: Kristeen Miss, MD;  Location: [facility];  Service: Neurosurgery;  Laterality: Right;  . HYSTEROSCOPY WITH D & C  07/17/2010   with exc. endometrial polyps  . KNEE ARTHROSCOPY Right 03/07/2003; 01/14/2005; 08/31/2006  . KNEE ARTHROSCOPY Left 03/21/2003; 11/26/2004  . LUMBAR PERCUTANEOUS PEDICLE SCREW 3 LEVEL N/A 02/17/2018   Procedure: LUMBAR PERCUTANEOUS PEDICLE SCREW PLACEMENT LUMBAR THREE-SACRAL ONE;  Surgeon: Kristeen Miss, MD;  Location: [facility];  Service: Neurosurgery;  Laterality: N/A;  . SHOULDER ARTHROSCOPY Right   . TOE SURGERY Left  01/10/2003   claw toe correction 2nd, 3rd, 4th toes  . TOTAL HIP ARTHROPLASTY Right 04/21/2016  . TOTAL HIP ARTHROPLASTY Right 04/21/2016   Procedure: TOTAL HIP ARTHROPLASTY ANTERIOR APPROACH;  Surgeon: Ninetta Lights, MD;  Location: [facility] [place];  Service: Orthopedics;  Laterality: Right;  . TOTAL KNEE ARTHROPLASTY Left 01/21/2010  . TOTAL KNEE ARTHROPLASTY Right 08/26/2008    There were no vitals filed for this visit.   Subjective Assessment - 12/10/20 2254    Subjective Denies any falls or changes.  Feeling very stiff.    Currently in Pain? Yes    Pain Score 3     Pain Location Back    Pain Orientation Right    Pain Descriptors / Indicators Aching;Dull    Pain Type Chronic pain    Pain Onset More than a month ago    Pain Frequency Constant    Aggravating Factors  increased activity    Pain Relieving Factors medication and heating pad           Aquatic therapy at [facility]  Patient seen for aquatic therapy today. Treatment took place in water 3.5-4.8 feet deep depending upon activity. Pt entered and exited the pool viasteps andbil rails withdistantsupervision.  Pt performed Runner's stretch RLE 30 sec hold x 1 rep; R heel cord stretch in standing with forefoot on wall 30 sec hold x 1 rep  Pt perfomed water walking forwards and backwards in various water depths.   Pt performed side step with squat withhand bar bells forUE abd/add. High marchwith hand bar bells to opposite kneeforwards and backwards.  Pt performedbilhip flexion, extension, abduction, hip flexion moving into extension andLE abd/add for 15reps alternating LE's. Pt able to perform with intermittent UE support.  Pt requires aquatic therapy for the buoyancy of the water to allow her to off load her body for spinal decompression and also to reduce the weight of her RLE in the water for decreased pain with AROM; buoyancy of water allows more freedom of movement than can be achieved with land  exercise  Pt also requires viscosity of water for strengthening; water allows reduced gait deviation due to reduced joint loading through buoyancy which facilitates improvedposture without excess stress & pain     PT Long Term Goals - 12/05/20 2011      PT LONG TERM GOAL #1   Title Pt will be independent with  aquatic HEP in order to indicate improved functional mobilty and general conditioning.    Time 6    Period Weeks    Status New    Target Date 01/23/21      PT LONG TERM GOAL #2   Title Pt will report improved RLE strength and AROM to be able to bring her RLE up to independently put lotion on R lower leg and foot.    Time 6    Period Weeks    Status New    Target Date 01/23/21      PT LONG  TERM GOAL #3   Title Pt will demonstrate improved endurance by participating in 45" aquatic therapy session with minimal c/o fatigue.    Time 6    Period Weeks    Status New    Target Date 01/23/21      PT LONG TERM GOAL #4   Title Pt will report decreased back pain to </= 2/10 intensity prior to start of aquatic therapy session.    Time 6    Period Weeks    Status New    Target Date 01/23/21                 Plan - 12/10/20 2256    Clinical Impression Statement Skilled aquatic session focused on flexibility, strength, endurance and and balance.  Pt reports feeling very stiff.  Focused on gentle movements today and relaxation.  Cont per poc.    Personal Factors and Comorbidities Fitness;Comorbidity 1;Time since onset of injury/illness/exacerbation;Past/Current Experience    Comorbidities lumbar spondylosis, lumbosacral radiculopathy L4, generalized OA    Examination-Activity Limitations Locomotion Level;Bathing;Bend;Dressing;Stand;Stairs;Squat    Examination-Participation Restrictions Cleaning;Community Activity;Laundry;Shop;Interpersonal Relationship    Stability/Clinical Decision Making Stable/Uncomplicated    Rehab Potential Good    PT Frequency 1x / week    PT  Duration 6 weeks   + eval for 7 visits total   PT Treatment/Interventions Aquatic Therapy    PT Next Visit Plan aquatic therapy    Consulted and Agree with Plan of Care Patient           Patient will benefit from skilled therapeutic intervention in order to improve the following deficits and impairments:  Difficulty walking,Pain,Decreased strength,Decreased endurance,Decreased activity tolerance,Impaired flexibility  Visit Diagnosis: Chronic right-sided low back pain without sciatica  Muscle weakness (generalized)  Other abnormalities of gait and mobility     Problem List Patient Active Problem List   Diagnosis Date Noted  . Flat foot 05/07/2019  . Abnormality of gait 02/22/2019  . Lumbosacral radiculopathy at L4 06/09/2018  . Generalized anxiety disorder   . Pain   . Fall   . Labile blood pressure   . Neuropathic pain   . Therapeutic opioid induced constipation   . Urinary retention   . Lumbar radiculopathy 02/21/2018  . PCOS (polycystic ovarian syndrome)   . Obesity   . Depression   . Generalized OA   . Postoperative pain   . Acute blood loss anemia   . Status post surgery 02/17/2018  . Lumbar radiculopathy, chronic 02/17/2018  . Primary localized osteoarthritis of right hip 04/21/2016  . Sinusitis 06/25/2015  . Cough 06/25/2015  . Essential hypertension 04/03/2015  . Dehydration 04/03/2015  . CAP (community acquired pneumonia) 03/31/2015  . Right lower lobe pneumonia 03/31/2015  . Fever 03/31/2015  . Pregnancy 04/29/2014  . Miscarriage   . Incomplete miscarriage 04/02/2011    Class: Acute  . Deep vein thrombosis (DVT) ([facility]) 04/02/2011    Class: Chronic    Narda Bonds, [place] 12/10/20 11:02 PM Phone: [phone] Fax: [facility] [address] [place], [place], [address] Phone: [phone]   Fax:  [phone]  Name:  Ashley [name] MRN: [id number] Date of Birth: March 06, 1976

## 2020-12-18 DIAGNOSIS — J3081 Allergic rhinitis due to animal (cat) (dog) hair and dander: Secondary | ICD-10-CM | POA: Diagnosis not present

## 2020-12-18 DIAGNOSIS — J3089 Other allergic rhinitis: Secondary | ICD-10-CM | POA: Diagnosis not present

## 2020-12-18 DIAGNOSIS — I10 Essential (primary) hypertension: Secondary | ICD-10-CM | POA: Diagnosis not present

## 2020-12-18 DIAGNOSIS — J301 Allergic rhinitis due to pollen: Secondary | ICD-10-CM | POA: Diagnosis not present

## 2020-12-22 ENCOUNTER — Other Ambulatory Visit: Payer: Self-pay

## 2020-12-22 ENCOUNTER — Ambulatory Visit (INDEPENDENT_AMBULATORY_CARE_PROVIDER_SITE_OTHER): Payer: Medicare Other

## 2020-12-22 ENCOUNTER — Ambulatory Visit (INDEPENDENT_AMBULATORY_CARE_PROVIDER_SITE_OTHER): Payer: Medicare Other | Admitting: Podiatry

## 2020-12-22 DIAGNOSIS — M79675 Pain in left toe(s): Secondary | ICD-10-CM | POA: Diagnosis not present

## 2020-12-22 DIAGNOSIS — B351 Tinea unguium: Secondary | ICD-10-CM

## 2020-12-22 DIAGNOSIS — M79674 Pain in right toe(s): Secondary | ICD-10-CM | POA: Diagnosis not present

## 2020-12-22 DIAGNOSIS — M659 Synovitis and tenosynovitis, unspecified: Secondary | ICD-10-CM

## 2020-12-22 DIAGNOSIS — L600 Ingrowing nail: Secondary | ICD-10-CM | POA: Diagnosis not present

## 2020-12-22 NOTE — Progress Notes (Signed)
Subjective:  Patient presents today status post attempted removal of orthopedic screws third and fourth digit of the right foot.  DOS: 08/21/2020.  Patient states that she is having some pain to the right third toe.  She would like to have it evaluated today.  She most recently had x-rays approximately 3 months ago.  Patient also states that she is having some pain and tenderness to the lateral aspect of the left great toenail where a prior nail avulsion was performed to address an ingrown toenail.  She is having some pain and sensitivity in the area.  Past Medical History:  Diagnosis Date  . Arthritis    knees, back  . 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 Complication of anesthesia   . Depression   . History of pneumonia   . Hypertension   . Lumbar spondylosis   . PCOS (polycystic ovarian syndrome)   . Pneumonia    2016  . PONV (postoperative nausea and vomiting)      Objective: Physical Exam General: The patient is alert and oriented x3 in no acute distress.  Dermatology: Skin is cool, dry and supple bilateral lower extremities. Negative for open lesions or macerations.  There is some dystrophic nail to the lateral border of the left great toe where the nail avulsion site was performed with some subungual debris.  Associated tenderness to palpation  Vascular: Palpable pedal pulses bilaterally. No edema or erythema noted. Capillary refill within normal limits.  Neurological: Epicritic and protective threshold grossly intact bilaterally.   Musculoskeletal Exam: All pedal and ankle joints range of motion within normal limits bilateral. Muscle strength 5/5 in all groups bilateral.  Tenderness to palpation also noted to the lateral aspect of the right third toe.  There is a palpable bump there that is either of osseous nature or from the orthopedic screw remaining within the toe   Radiographic Exam: Skin marker placed to the lateral aspect of the right third toe directly  overlies the end of the orthopedic screw that is somewhat laterally deviated.  Clinically this associates with the palpable prominence to the distal tuft lateral aspect of the toe.  Patient's associated symptoms are likely due to the distal portion of the screw   Assessment: 1. s/p removal of orthopedic screws third and fourth digit right foot.  Failure to remove the orthopedic screw to the third digit right foot. DOS: 08/21/2020 2.  Synovitis/DJD bilateral ankles 3.  Onychomycosis of toenails bilateral 4.  Symptomatic screw third digit right foot   Plan of Care:  1. Patient was evaluated.  2.  Light debridement was performed using a nail nipper without incident or bleeding to the lateral border of the left great toe 3.  Continue oral Lamisil until completed  4.  In regards to the orthopedic screw, do believe the screw needs to either be removed in toto or at least the distal portion of the screw.  Prior attempt to remove the screw was unsuccessful since the screw was tightly embedded within the bone and would not loosen.  May require screw extraction system; Arthrex 5.  Will discuss best options to remove the screw and contact the patient via telephone to discuss moving forward with possible screw removal to alleviate pressure from the callus into the very symptomatic   Edrick Kins, DPM [facility]  Dr. Edrick Kins, DPM    [address]  [place], [place] [address]                Office [phone]  Fax [phone]

## 2020-12-23 ENCOUNTER — Encounter: Payer: Medicare Other | Attending: Surgery | Admitting: Skilled Nursing Facility1

## 2020-12-23 ENCOUNTER — Other Ambulatory Visit: Payer: Self-pay

## 2020-12-23 DIAGNOSIS — E669 Obesity, unspecified: Secondary | ICD-10-CM | POA: Insufficient documentation

## 2020-12-23 NOTE — Progress Notes (Signed)
Supervised Weight Loss Visit Bariatric Nutrition Education  Planned Surgery: Sleeve Pt Expectation of Surgery/ Goals: to lose weight  6 out of 6 SWL Appointments   Pt completed visits.  Pt has cleared nutrition requirements.  NUTRITION ASSESSMENT  Anthropometrics  Start weight at [facility]: 298 lbs (date: 07/08/2020) Today's weight: 297 pounds BMI: 40.94 kg/m2    Clinical  Medical Hx: HTN, PCOS Medications: see list Notable Signs/Symptoms: chronic pain  Lifestyle & Dietary Hx .   Pt state she is very stressed due to her baby girl being sick and having to go to the ED stating her oldest has ulcerative colitis. Pt states she recognizes her daughter likes fast food and does not drink enough water or eat enough non starchy vegetables or fruit but is working with her to increase these foods but feel defeated a lot.    Estimated daily fluid intake: unknown Supplements: MVI, [place] ACV Current average weekly physical activity: ADLs  24-Hr Dietary Recall First Meal: egg white + canadian bacon Snack: cheese + sugar free jello Second Meal: banana nut muffin or fast food Snack: cheese + sugar free jello Third Meal: spinach + salmon Snack:   Beverages: 1 gallon water, hot tea, arnold palmer   Estimated Energy Needs Calories: 1600  NUTRITION DIAGNOSIS  Overweight/obesity ([place]-3.3) related to past poor dietary habits and physical inactivity as evidenced by patient w/ planned sleeve gastrectomy surgery following dietary guidelines for continued weight loss.   NUTRITION INTERVENTION  Nutrition counseling (C-1) and education (E-2) to facilitate bariatric surgery goals.  Pre-Op Goals Progress & New Goals . Continue to practice logging everything you eat . Continue to aim for 64 fluid ounces per day . Continue chewing until applesauce consistency  . continued: Eat at least 3 times per day (structured meals/snacks)  . continued: visit [employer]  . continue: try brown and  white rice put together to incorporate a whole grain . continue:Use the bariatric MyPlate to create meals . NEW: use one of the several sources emailed to you to help with feeding your child nutrient dense foods which will help you to be consistant  Handouts Previously Provided Include   Balanced Snacks   bariatric MyPlate  Several different fun recipe sites for children and the [employer] website   Learning Style & Readiness for Change Teaching method utilized: Visual & Auditory  Demonstrated degree of understanding via: Teach Back  Barriers to learning/adherence to lifestyle change: picky eating child  RD's Notes for next Visit  . Assess pts adherence to chosen goals   MONITORING & EVALUATION Dietary intake, weekly physical activity, body weight, and pre-op goals  Next Steps  Patient is to return to [facility] for pre-op class Pt has completed visits. No further supervised visits required/recomended

## 2020-12-24 ENCOUNTER — Ambulatory Visit: Payer: Medicare Other | Attending: Physical Medicine and Rehabilitation | Admitting: Physical Therapy

## 2020-12-24 ENCOUNTER — Encounter: Payer: Self-pay | Admitting: Physical Therapy

## 2020-12-24 DIAGNOSIS — G8929 Other chronic pain: Secondary | ICD-10-CM | POA: Diagnosis not present

## 2020-12-24 DIAGNOSIS — J3089 Other allergic rhinitis: Secondary | ICD-10-CM | POA: Diagnosis not present

## 2020-12-24 DIAGNOSIS — J3081 Allergic rhinitis due to animal (cat) (dog) hair and dander: Secondary | ICD-10-CM | POA: Diagnosis not present

## 2020-12-24 DIAGNOSIS — J301 Allergic rhinitis due to pollen: Secondary | ICD-10-CM | POA: Diagnosis not present

## 2020-12-24 DIAGNOSIS — M6281 Muscle weakness (generalized): Secondary | ICD-10-CM | POA: Insufficient documentation

## 2020-12-24 DIAGNOSIS — M545 Low back pain, unspecified: Secondary | ICD-10-CM | POA: Insufficient documentation

## 2020-12-24 DIAGNOSIS — R2689 Other abnormalities of gait and mobility: Secondary | ICD-10-CM | POA: Diagnosis not present

## 2020-12-24 DIAGNOSIS — R600 Localized edema: Secondary | ICD-10-CM | POA: Diagnosis not present

## 2020-12-24 DIAGNOSIS — I8312 Varicose veins of left lower extremity with inflammation: Secondary | ICD-10-CM | POA: Diagnosis not present

## 2020-12-24 DIAGNOSIS — I8311 Varicose veins of right lower extremity with inflammation: Secondary | ICD-10-CM | POA: Diagnosis not present

## 2020-12-24 NOTE — Therapy (Signed)
[facility] [address] [place], [place], [address] Phone: [phone]   Fax:  [phone]  Physical Therapy Treatment  Patient Details  Name: Ashley [name] MRN: [id number] Date of Birth: June 16, 1976 Referring Provider (PT): Maude Leriche, PA-C   Encounter Date: 12/24/2020   PT End of Session - 12/24/20 1552    Visit Number 3    Number of Visits 7   eval + 6 aquatic visits   Authorization Type UHC medicare    Authorization Time Period 12-04-20 - 02-17-21    PT Start Time 1330    PT Stop Time 1415    PT Time Calculation (min) 45 min    Equipment Utilized During Treatment Other (comment)   arm bar bells, ankle buoyancy cuffs   Activity Tolerance Patient tolerated treatment well    Behavior During Therapy [facility] for tasks assessed/performed           Past Medical History:  Diagnosis Date  . Arthritis    knees, back  . 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 Complication of anesthesia   . Depression   . History of pneumonia   . Hypertension   . Lumbar spondylosis   . PCOS (polycystic ovarian syndrome)   . Pneumonia    2016  . PONV (postoperative nausea and vomiting)     Past Surgical History:  Procedure Laterality Date  . ABDOMINAL EXPOSURE N/A 02/17/2018   Procedure: ABDOMINAL EXPOSURE;  Surgeon: Rosetta Posner, MD;  Location: [facility] OR;  Service: Vascular;  Laterality: N/A;  . ANKLE SURGERY     x 2  . ANTERIOR LAT LUMBAR FUSION N/A 02/17/2018   Procedure: Lumbar three-four Lumbar four-five  Anterolateral lumbar interbody fusion;  Surgeon: Kristeen Miss, MD;  Location: [facility];  Service: Neurosurgery;  Laterality: N/A;  . ANTERIOR LUMBAR FUSION N/A 02/17/2018   Procedure: Lumbar five-Sacral one Anterior lumbar interbody fusion;  Surgeon: Kristeen Miss, MD;  Location: [facility];  Service: Neurosurgery;  Laterality: N/A;  . APPLICATION OF ROBOTIC ASSISTANCE FOR SPINAL PROCEDURE N/A 02/17/2018   Procedure:  APPLICATION OF ROBOTIC ASSISTANCE FOR SPINAL PROCEDURE;  Surgeon: Kristeen Miss, MD;  Location: [facility];  Service: Neurosurgery;  Laterality: N/A;  . APPLICATION OF ROBOTIC ASSISTANCE FOR SPINAL PROCEDURE N/A 06/09/2018   Procedure: APPLICATION OF ROBOTIC ASSISTANCE FOR SPINAL PROCEDURE;  Surgeon: Kristeen Miss, MD;  Location: [facility];  Service: Neurosurgery;  Laterality: N/A;  . BACK SURGERY     2017  discectomy  . CARPAL TUNNEL RELEASE Left 07/04/2014   Procedure: LEFT CARPAL TUNNEL RELEASE;  Surgeon: Ninetta Lights, MD;  Location: [facility] [facility];  Service: Orthopedics;  Laterality: Left;  . CARPAL TUNNEL RELEASE Right 08/08/2014   Procedure: RIGHT CARPAL TUNNEL RELEASE;  Surgeon: Ninetta Lights, MD;  Location: [facility];  Service: Orthopedics;  Laterality: Right;  . DILATION AND CURETTAGE OF UTERUS    . DILATION AND EVACUATION  04/02/2011   Procedure: DILATATION AND EVACUATION (D&E);  Surgeon: Luz Lex, MD;  Location: [facility];  Service: Gynecology;  Laterality: N/A;  dvt left mid thigh  . DORSAL COMPARTMENT RELEASE Left 07/04/2014   Procedure: LEFT DEQUERVAINS;  Surgeon: Ninetta Lights, MD;  Location: [facility];  Service: Orthopedics;  Laterality: Left;  . ENDOSCOPIC PLANTAR FASCIOTOMY Left 02/01/2002  . HARDWARE REMOVAL Right 06/09/2018   Procedure: Repositioning of Right Lumbar three Right lumbar  four pedicle screw with MAZOR;  Surgeon: Ellene Route,  Mallie Mussel, MD;  Location: [facility];  Service: Neurosurgery;  Laterality: Right;  . HYSTEROSCOPY WITH D & C  07/17/2010   with exc. endometrial polyps  . KNEE ARTHROSCOPY Right 03/07/2003; 01/14/2005; 08/31/2006  . KNEE ARTHROSCOPY Left 03/21/2003; 11/26/2004  . LUMBAR PERCUTANEOUS PEDICLE SCREW 3 LEVEL N/A 02/17/2018   Procedure: LUMBAR PERCUTANEOUS PEDICLE SCREW PLACEMENT LUMBAR THREE-SACRAL ONE;  Surgeon: Kristeen Miss, MD;  Location: [facility];  Service: Neurosurgery;  Laterality: N/A;  . SHOULDER ARTHROSCOPY Right   .  TOE SURGERY Left 01/10/2003   claw toe correction 2nd, 3rd, 4th toes  . TOTAL HIP ARTHROPLASTY Right 04/21/2016  . TOTAL HIP ARTHROPLASTY Right 04/21/2016   Procedure: TOTAL HIP ARTHROPLASTY ANTERIOR APPROACH;  Surgeon: Ninetta Lights, MD;  Location: [facility];  Service: Orthopedics;  Laterality: Right;  . TOTAL KNEE ARTHROPLASTY Left 01/21/2010  . TOTAL KNEE ARTHROPLASTY Right 08/26/2008    There were no vitals filed for this visit.   Subjective Assessment - 12/24/20 1547    Subjective Awaiting sleeve surgery.  Might have to have screw removed from toe. Was tired after last session.    Pain Score 3     Pain Location Back    Pain Orientation Right    Pain Descriptors / Indicators Aching;Dull    Pain Type Chronic pain    Pain Onset More than a month ago    Pain Frequency Constant    Aggravating Factors  increased activity    Pain Relieving Factors heat           Aquatic [facility] at [facility]  Patient seen for aquatic therapy today. Treatment took place in water 3.5-4.8 feet deep depending upon activity. Pt entered and exited the pool viasteps andbil rails withdistantsupervision.  Pt performed Runner's stretch RLE 30 sec hold x 1 rep; R heel cord stretch in standing with forefoot on wall 30 sec hold x 1 rep  Pt perfomed water walking forwards and backwards in 4.8 ft of water.  Forward and backward marching and forward and backward marching with arm barbells to opposite knee.  Side stepping with straight leg then side stepping squat with arm barbells.  Pt performedLE exercises with buoyancy cuffs as follows: bilhip flexion, extension, abduction,hip flexion moving into extension, LE abd/add and hip external rotation with hip and knee at 90 degrees for 15reps alternating LE's. Pt able to perform with intermittent UE support.Heel raises x 15.  Pt requires aquatic therapy for the buoyancy of the water to allow her to off load her body for spinal decompression and also  to reduce the weight of her RLE in the water for decreased pain with AROM; buoyancy of water allows more freedom of movement than can be achieved with land exercise  Pt also requires viscosity of water for strengthening; water allows reduced gait deviation due to reduced joint loading through buoyancy which facilitates improvedposture without excess stress & pain      PT Long Term Goals - 12/05/20 2011      PT LONG TERM GOAL #1   Title Pt will be independent with  aquatic HEP in order to indicate improved functional mobilty and general conditioning.    Time 6    Period Weeks    Status New    Target Date 01/23/21      PT LONG TERM GOAL #2   Title Pt will report improved RLE strength and AROM to be able to bring her RLE up to independently put lotion on R lower leg and  foot.    Time 6    Period Weeks    Status New    Target Date 01/23/21      PT LONG TERM GOAL #3   Title Pt will demonstrate improved endurance by participating in 45" aquatic therapy session with minimal c/o fatigue.    Time 6    Period Weeks    Status New    Target Date 01/23/21      PT LONG TERM GOAL #4   Title Pt will report decreased back pain to </= 2/10 intensity prior to start of aquatic therapy session.    Time 6    Period Weeks    Status New    Target Date 01/23/21                 Plan - 12/24/20 1554    Clinical Impression Statement Skilled aquatic session focused on strength, flexibility, endurance, gait/balance.  Pt with tightness in R quad today but responded well to stretch.  Able to tolerate buoyancy cuffs and increased water depth today for added challenge.  Cont per poc.    Personal Factors and Comorbidities Fitness;Comorbidity 1;Time since onset of injury/illness/exacerbation;Past/Current Experience    Comorbidities lumbar spondylosis, lumbosacral radiculopathy L4, generalized OA    Examination-Activity Limitations Locomotion Level;Bathing;Bend;Dressing;Stand;Stairs;Squat     Examination-Participation Restrictions Cleaning;Community Activity;Laundry;Shop;Interpersonal Relationship    Stability/Clinical Decision Making Stable/Uncomplicated    Rehab Potential Good    PT Frequency 1x / week    PT Duration 6 weeks   + eval for 7 visits total   PT Treatment/Interventions Aquatic Therapy    PT Next Visit Plan aquatic therapy    Consulted and Agree with Plan of Care Patient           Patient will benefit from skilled therapeutic intervention in order to improve the following deficits and impairments:  Difficulty walking,Pain,Decreased strength,Decreased endurance,Decreased activity tolerance,Impaired flexibility  Visit Diagnosis: Chronic right-sided low back pain without sciatica  Muscle weakness (generalized)  Other abnormalities of gait and mobility     Problem List Patient Active Problem List   Diagnosis Date Noted  . Flat foot 05/07/2019  . Abnormality of gait 02/22/2019  . Lumbosacral radiculopathy at L4 06/09/2018  . Generalized anxiety disorder   . Pain   . Fall   . Labile blood pressure   . Neuropathic pain   . Therapeutic opioid induced constipation   . Urinary retention   . Lumbar radiculopathy 02/21/2018  . PCOS (polycystic ovarian syndrome)   . Obesity   . Depression   . Generalized OA   . Postoperative pain   . Acute blood loss anemia   . Status post surgery 02/17/2018  . Lumbar radiculopathy, chronic 02/17/2018  . Primary localized osteoarthritis of right hip 04/21/2016  . Sinusitis 06/25/2015  . Cough 06/25/2015  . Essential hypertension 04/03/2015  . Dehydration 04/03/2015  . CAP (community acquired pneumonia) 03/31/2015  . Right lower lobe pneumonia 03/31/2015  . Fever 03/31/2015  . Pregnancy 04/29/2014  . Miscarriage   . Incomplete miscarriage 04/02/2011    Class: Acute  . 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 [place], [place], [address] Phone: [phone]   Fax:  [phone]  Name: Ashley [name] MRN: [id number] Date of Birth: May 11, 1976

## 2020-12-29 DIAGNOSIS — Z23 Encounter for immunization: Secondary | ICD-10-CM | POA: Diagnosis not present

## 2020-12-30 ENCOUNTER — Other Ambulatory Visit: Payer: Self-pay

## 2020-12-30 ENCOUNTER — Ambulatory Visit: Payer: Medicare Other | Admitting: Physical Therapy

## 2020-12-30 ENCOUNTER — Encounter: Payer: Self-pay | Admitting: Physical Therapy

## 2020-12-30 DIAGNOSIS — M6281 Muscle weakness (generalized): Secondary | ICD-10-CM

## 2020-12-30 DIAGNOSIS — M545 Low back pain, unspecified: Secondary | ICD-10-CM | POA: Diagnosis not present

## 2020-12-30 DIAGNOSIS — G8929 Other chronic pain: Secondary | ICD-10-CM | POA: Diagnosis not present

## 2020-12-30 DIAGNOSIS — R2689 Other abnormalities of gait and mobility: Secondary | ICD-10-CM | POA: Diagnosis not present

## 2020-12-30 NOTE — Therapy (Signed)
[facility] [address] [place], [place], [address] Phone: [phone]   Fax:  [phone]  Physical Therapy Treatment  Patient Details  Name: Ashley [name] MRN: [id number] Date of Birth: November 18, 1975 Referring Provider (PT): Maude Leriche, [place]   Encounter Date: 12/30/2020   PT End of Session - 12/30/20 1632    Visit Number 4    Number of Visits 7   eval + 6 aquatic visits   Authorization Type UHC medicare    Authorization Time Period 12-04-20 - 02-17-21    PT Start Time 1100    PT Stop Time 1145    PT Time Calculation (min) 45 min    Equipment Utilized During Treatment Other (comment)   arm bar bells, ankle buoyancy cuffs   Activity Tolerance Patient tolerated treatment well    Behavior During Therapy [facility] for tasks assessed/performed           Past Medical History:  Diagnosis Date  . Arthritis    knees, back  . 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 Complication of anesthesia   . Depression   . History of pneumonia   . Hypertension   . Lumbar spondylosis   . PCOS (polycystic ovarian syndrome)   . Pneumonia    2016  . PONV (postoperative nausea and vomiting)     Past Surgical History:  Procedure Laterality Date  . ABDOMINAL EXPOSURE N/A 02/17/2018   Procedure: ABDOMINAL EXPOSURE;  Surgeon: Rosetta Posner, MD;  Location: [facility] OR;  Service: Vascular;  Laterality: N/A;  . ANKLE SURGERY     x 2  . ANTERIOR LAT LUMBAR FUSION N/A 02/17/2018   Procedure: Lumbar three-four Lumbar four-five  Anterolateral lumbar interbody fusion;  Surgeon: Kristeen Miss, MD;  Location: [place];  Service: Neurosurgery;  Laterality: N/A;  . ANTERIOR LUMBAR FUSION N/A 02/17/2018   Procedure: Lumbar five-Sacral one Anterior lumbar interbody fusion;  Surgeon: Kristeen Miss, MD;  Location: [place];  Service: Neurosurgery;  Laterality: N/A;  . APPLICATION OF ROBOTIC ASSISTANCE FOR SPINAL PROCEDURE N/A 02/17/2018   Procedure:  APPLICATION OF ROBOTIC ASSISTANCE FOR SPINAL PROCEDURE;  Surgeon: Kristeen Miss, MD;  Location: [facility] [place];  Service: Neurosurgery;  Laterality: N/A;  . APPLICATION OF ROBOTIC ASSISTANCE FOR SPINAL PROCEDURE N/A 06/09/2018   Procedure: APPLICATION OF ROBOTIC ASSISTANCE FOR SPINAL PROCEDURE;  Surgeon: Kristeen Miss, MD;  Location: [facility];  Service: Neurosurgery;  Laterality: N/A;  . BACK SURGERY     2017  discectomy  . CARPAL TUNNEL RELEASE Left 07/04/2014   Procedure: LEFT CARPAL TUNNEL RELEASE;  Surgeon: Ninetta Lights, MD;  Location: [facility];  Service: Orthopedics;  Laterality: Left;  . CARPAL TUNNEL RELEASE Right 08/08/2014   Procedure: RIGHT CARPAL TUNNEL RELEASE;  Surgeon: Ninetta Lights, MD;  Location: [facility];  Service: Orthopedics;  Laterality: Right;  . DILATION AND CURETTAGE OF UTERUS    . DILATION AND EVACUATION  04/02/2011   Procedure: DILATATION AND EVACUATION (D&E);  Surgeon: Luz Lex, MD;  Location: [facility];  Service: Gynecology;  Laterality: N/A;  dvt left mid thigh  . DORSAL COMPARTMENT RELEASE Left 07/04/2014   Procedure: LEFT DEQUERVAINS;  Surgeon: Ninetta Lights, MD;  Location: [facility];  Service: Orthopedics;  Laterality: Left;  . ENDOSCOPIC PLANTAR FASCIOTOMY Left 02/01/2002  . HARDWARE REMOVAL Right 06/09/2018   Procedure: Repositioning of Right Lumbar three Right lumbar  four pedicle screw with MAZOR;  Surgeon: Ellene Route,  Mallie Mussel, MD;  Location: [place];  Service: Neurosurgery;  Laterality: Right;  . HYSTEROSCOPY WITH D & C  07/17/2010   with exc. endometrial polyps  . KNEE ARTHROSCOPY Right 03/07/2003; 01/14/2005; 08/31/2006  . KNEE ARTHROSCOPY Left 03/21/2003; 11/26/2004  . LUMBAR PERCUTANEOUS PEDICLE SCREW 3 LEVEL N/A 02/17/2018   Procedure: LUMBAR PERCUTANEOUS PEDICLE SCREW PLACEMENT LUMBAR THREE-SACRAL ONE;  Surgeon: Kristeen Miss, MD;  Location: [facility];  Service: Neurosurgery;  Laterality: N/A;  . SHOULDER ARTHROSCOPY Right   .  TOE SURGERY Left 01/10/2003   claw toe correction 2nd, 3rd, 4th toes  . TOTAL HIP ARTHROPLASTY Right 04/21/2016  . TOTAL HIP ARTHROPLASTY Right 04/21/2016   Procedure: TOTAL HIP ARTHROPLASTY ANTERIOR APPROACH;  Surgeon: Ninetta Lights, MD;  Location: [facility];  Service: Orthopedics;  Laterality: Right;  . TOTAL KNEE ARTHROPLASTY Left 01/21/2010  . TOTAL KNEE ARTHROPLASTY Right 08/26/2008    There were no vitals filed for this visit.   Subjective Assessment - 12/30/20 1631    Subjective Denies any changes.    Currently in Pain? Yes    Pain Score 3     Pain Location Back    Pain Orientation Right    Pain Descriptors / Indicators Aching    Pain Type Chronic pain    Pain Onset More than a month ago    Pain Frequency Constant           [facility]  Patient seen for aquatic therapy today. Treatment took place in water 3.5-4.33feet deep depending upon activity. Pt entered and exited the pool viasteps andbil rails withdistantsupervision.  Pt performed Runner's stretch RLE 30 sec hold x 1 rep; R heel cord stretch in standing with forefoot on wall 30 sec hold x 1 rep  Performed again at end of session.  Pt perfomed water walkingforwards and backwards in 4.8 ft of water.  Forward and backward marching and forward and backward marching with arm barbells to opposite knee.  Side stepping with straight leg then side stepping squat with arm barbells.  Pt performedLE exercises with buoyancy cuffs as follows: bilhip flexion, extension, abduction,hip flexion moving into extension, LE abd/add and hip external rotation with hip and knee at 90 degrees for15reps alternating LE's. Pt able to perform with intermittent UE support.Heel raises x 15.  Bil LE squats x 15 then single leg x 15 reps each.  Pt requires aquatic therapy for the buoyancy of the water to allow her to off load her body for spinal decompression and also to reduce the weight of her RLEin the water  for decreased pain with AROM; buoyancy of water allows more freedom of movement than can be achieved with land exercise  Pt also requires viscosity of water for strengthening; water allows reduced gait deviation due to reduced joint loading through buoyancy which facilitates improvedposture without excess stress & pain     PT Long Term Goals - 12/05/20 2011      PT LONG TERM GOAL #1   Title Pt will be independent with  aquatic HEP in order to indicate improved functional mobilty and general conditioning.    Time 6    Period Weeks    Status New    Target Date 01/23/21      PT LONG TERM GOAL #2   Title Pt will report improved RLE strength and AROM to be able to bring her RLE up to independently put lotion on R lower leg and foot.    Time 6  Period Weeks    Status New    Target Date 01/23/21      PT LONG TERM GOAL #3   Title Pt will demonstrate improved endurance by participating in 45" aquatic therapy session with minimal c/o fatigue.    Time 6    Period Weeks    Status New    Target Date 01/23/21      PT LONG TERM GOAL #4   Title Pt will report decreased back pain to </= 2/10 intensity prior to start of aquatic therapy session.    Time 6    Period Weeks    Status New    Target Date 01/23/21                 Plan - 12/30/20 1633    Clinical Impression Statement Pt reports relief in back pain with aquatic sessions.  Has follow up appt with Dr Ellene Route this week.  Cont per poc.    Personal Factors and Comorbidities Fitness;Comorbidity 1;Time since onset of injury/illness/exacerbation;Past/Current Experience    Comorbidities lumbar spondylosis, lumbosacral radiculopathy L4, generalized OA    Examination-Activity Limitations Locomotion Level;Bathing;Bend;Dressing;Stand;Stairs;Squat    Examination-Participation Restrictions Cleaning;Community Activity;Laundry;Shop;Interpersonal Relationship    Stability/Clinical Decision Making Stable/Uncomplicated    Rehab Potential  Good    PT Frequency 1x / week    PT Duration 6 weeks   + eval for 7 visits total   PT Treatment/Interventions Aquatic Therapy    PT Next Visit Plan aquatic therapy    Consulted and Agree with Plan of Care Patient           Patient will benefit from skilled therapeutic intervention in order to improve the following deficits and impairments:  Difficulty walking,Pain,Decreased strength,Decreased endurance,Decreased activity tolerance,Impaired flexibility  Visit Diagnosis: Chronic right-sided low back pain without sciatica  Muscle weakness (generalized)  Other abnormalities of gait and mobility     Problem List Patient Active Problem List   Diagnosis Date Noted  . Flat foot 05/07/2019  . Abnormality of gait 02/22/2019  . Lumbosacral radiculopathy at L4 06/09/2018  . Generalized anxiety disorder   . Pain   . Fall   . Labile blood pressure   . Neuropathic pain   . Therapeutic opioid induced constipation   . Urinary retention   . Lumbar radiculopathy 02/21/2018  . PCOS (polycystic ovarian syndrome)   . Obesity   . Depression   . Generalized OA   . Postoperative pain   . Acute blood loss anemia   . Status post surgery 02/17/2018  . Lumbar radiculopathy, chronic 02/17/2018  . Primary localized osteoarthritis of right hip 04/21/2016  . Sinusitis 06/25/2015  . Cough 06/25/2015  . Essential hypertension 04/03/2015  . Dehydration 04/03/2015  . CAP (community acquired pneumonia) 03/31/2015  . Right lower lobe pneumonia 03/31/2015  . Fever 03/31/2015  . Pregnancy 04/29/2014  . Miscarriage   . Incomplete miscarriage 04/02/2011    Class: Acute  . 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 [place], [place], [address] Phone:  [phone]   Fax:  [phone]  Name: Ashley [name] MRN: [id number] Date of Birth: 05/27/1976

## 2020-12-31 DIAGNOSIS — Z9884 Bariatric surgery status: Secondary | ICD-10-CM | POA: Diagnosis not present

## 2020-12-31 DIAGNOSIS — M5416 Radiculopathy, lumbar region: Secondary | ICD-10-CM | POA: Diagnosis not present

## 2020-12-31 DIAGNOSIS — Z6841 Body Mass Index (BMI) 40.0 and over, adult: Secondary | ICD-10-CM

## 2020-12-31 DIAGNOSIS — I10 Essential (primary) hypertension: Secondary | ICD-10-CM | POA: Diagnosis not present

## 2020-12-31 DIAGNOSIS — Z1321 Encounter for screening for nutritional disorder: Secondary | ICD-10-CM | POA: Diagnosis not present

## 2021-01-05 ENCOUNTER — Ambulatory Visit: Payer: Medicare Other | Admitting: Physical Therapy

## 2021-01-07 ENCOUNTER — Telehealth: Payer: Self-pay | Admitting: *Deleted

## 2021-01-07 NOTE — Telephone Encounter (Signed)
Patient is calling to f/u on the status of a screw in the toe, was mentioned during visit 2 weeks ago. The doctor said that he would discuss with his colleagues and would contact her. Please advise.

## 2021-01-08 DIAGNOSIS — J301 Allergic rhinitis due to pollen: Secondary | ICD-10-CM | POA: Diagnosis not present

## 2021-01-08 DIAGNOSIS — J3089 Other allergic rhinitis: Secondary | ICD-10-CM | POA: Diagnosis not present

## 2021-01-08 DIAGNOSIS — J3081 Allergic rhinitis due to animal (cat) (dog) hair and dander: Secondary | ICD-10-CM | POA: Diagnosis not present

## 2021-01-12 ENCOUNTER — Encounter: Payer: Self-pay | Admitting: Physical Therapy

## 2021-01-12 ENCOUNTER — Ambulatory Visit: Payer: Medicare Other | Admitting: Physical Therapy

## 2021-01-12 ENCOUNTER — Other Ambulatory Visit: Payer: Self-pay

## 2021-01-12 DIAGNOSIS — J3081 Allergic rhinitis due to animal (cat) (dog) hair and dander: Secondary | ICD-10-CM | POA: Diagnosis not present

## 2021-01-12 DIAGNOSIS — M6281 Muscle weakness (generalized): Secondary | ICD-10-CM | POA: Diagnosis not present

## 2021-01-12 DIAGNOSIS — J3089 Other allergic rhinitis: Secondary | ICD-10-CM | POA: Diagnosis not present

## 2021-01-12 DIAGNOSIS — J301 Allergic rhinitis due to pollen: Secondary | ICD-10-CM | POA: Diagnosis not present

## 2021-01-12 DIAGNOSIS — G8929 Other chronic pain: Secondary | ICD-10-CM | POA: Diagnosis not present

## 2021-01-12 DIAGNOSIS — R2689 Other abnormalities of gait and mobility: Secondary | ICD-10-CM

## 2021-01-12 DIAGNOSIS — M545 Low back pain, unspecified: Secondary | ICD-10-CM | POA: Diagnosis not present

## 2021-01-12 NOTE — Therapy (Signed)
[facility] [address] [place], [place], [address] Phone: [phone]   Fax:  [phone]  Physical Therapy Treatment  Patient Details  Name: Ashley [name] MRN: [id number] Date of Birth: Nov 27, 1975 Referring Provider (PT): Maude Leriche, PA-C   Encounter Date: 01/12/2021   PT End of Session - 01/12/21 1453    Visit Number 5    Number of Visits 7   eval + 6 aquatic visits   Authorization Type [employer] medicare    Authorization Time Period 12-04-20 - 02-17-21    PT Start Time 6734    PT Stop Time 1230    PT Time Calculation (min) 45 min    Equipment Utilized During Treatment Other (comment)   arm bar bells, ankle buoyancy cuffs   Activity Tolerance Patient tolerated treatment well    Behavior During Therapy [facility] for tasks assessed/performed           Past Medical History:  Diagnosis Date  . Arthritis    knees, back  . 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 Complication of anesthesia   . Depression   . History of pneumonia   . Hypertension   . Lumbar spondylosis   . PCOS (polycystic ovarian syndrome)   . Pneumonia    2016  . PONV (postoperative nausea and vomiting)     Past Surgical History:  Procedure Laterality Date  . ABDOMINAL EXPOSURE N/A 02/17/2018   Procedure: ABDOMINAL EXPOSURE;  Surgeon: Rosetta Posner, MD;  Location: [facility] OR;  Service: Vascular;  Laterality: N/A;  . ANKLE SURGERY     x 2  . ANTERIOR LAT LUMBAR FUSION N/A 02/17/2018   Procedure: Lumbar three-four Lumbar four-five  Anterolateral lumbar interbody fusion;  Surgeon: Kristeen Miss, MD;  Location: [place];  Service: Neurosurgery;  Laterality: N/A;  . ANTERIOR LUMBAR FUSION N/A 02/17/2018   Procedure: Lumbar five-Sacral one Anterior lumbar interbody fusion;  Surgeon: Kristeen Miss, MD;  Location: [place];  Service: Neurosurgery;  Laterality: N/A;  . APPLICATION OF ROBOTIC ASSISTANCE FOR SPINAL PROCEDURE N/A 02/17/2018   Procedure:  APPLICATION OF ROBOTIC ASSISTANCE FOR SPINAL PROCEDURE;  Surgeon: Kristeen Miss, MD;  Location: [place];  Service: Neurosurgery;  Laterality: N/A;  . APPLICATION OF ROBOTIC ASSISTANCE FOR SPINAL PROCEDURE N/A 06/09/2018   Procedure: APPLICATION OF ROBOTIC ASSISTANCE FOR SPINAL PROCEDURE;  Surgeon: Kristeen Miss, MD;  Location: [place];  Service: Neurosurgery;  Laterality: N/A;  . BACK SURGERY     2017  discectomy  . CARPAL TUNNEL RELEASE Left 07/04/2014   Procedure: LEFT CARPAL TUNNEL RELEASE;  Surgeon: Ninetta Lights, MD;  Location: [place];  Service: Orthopedics;  Laterality: Left;  . CARPAL TUNNEL RELEASE Right 08/08/2014   Procedure: RIGHT CARPAL TUNNEL RELEASE;  Surgeon: Ninetta Lights, MD;  Location: [place];  Service: Orthopedics;  Laterality: Right;  . DILATION AND CURETTAGE OF UTERUS    . DILATION AND EVACUATION  04/02/2011   Procedure: DILATATION AND EVACUATION (D&E);  Surgeon: Luz Lex, MD;  Location: [facility];  Service: Gynecology;  Laterality: N/A;  dvt left mid thigh  . DORSAL COMPARTMENT RELEASE Left 07/04/2014   Procedure: LEFT DEQUERVAINS;  Surgeon: Ninetta Lights, MD;  Location: [place];  Service: Orthopedics;  Laterality: Left;  . ENDOSCOPIC PLANTAR FASCIOTOMY Left 02/01/2002  . HARDWARE REMOVAL Right 06/09/2018   Procedure: Repositioning of Right Lumbar three Right lumbar  four pedicle screw with MAZOR;  Surgeon: Ellene Route,  Mallie Mussel, MD;  Location: [place];  Service: Neurosurgery;  Laterality: Right;  . HYSTEROSCOPY WITH D & C  07/17/2010   with exc. endometrial polyps  . KNEE ARTHROSCOPY Right 03/07/2003; 01/14/2005; 08/31/2006  . KNEE ARTHROSCOPY Left 03/21/2003; 11/26/2004  . LUMBAR PERCUTANEOUS PEDICLE SCREW 3 LEVEL N/A 02/17/2018   Procedure: LUMBAR PERCUTANEOUS PEDICLE SCREW PLACEMENT LUMBAR THREE-SACRAL ONE;  Surgeon: Kristeen Miss, MD;  Location: [place];  Service: Neurosurgery;  Laterality: N/A;  . SHOULDER ARTHROSCOPY Right   .  TOE SURGERY Left 01/10/2003   claw toe correction 2nd, 3rd, 4th toes  . TOTAL HIP ARTHROPLASTY Right 04/21/2016  . TOTAL HIP ARTHROPLASTY Right 04/21/2016   Procedure: TOTAL HIP ARTHROPLASTY ANTERIOR APPROACH;  Surgeon: Ninetta Lights, MD;  Location: [place];  Service: Orthopedics;  Laterality: Right;  . TOTAL KNEE ARTHROPLASTY Left 01/21/2010  . TOTAL KNEE ARTHROPLASTY Right 08/26/2008    There were no vitals filed for this visit.   Subjective Assessment - 01/12/21 1452    Subjective Feeling stiff all over today.  Still no response on plan for removing screw from toe or on gastric surgery.    Currently in Pain? Yes    Pain Score 3     Pain Location Other (Comment)   all over   Pain Descriptors / Indicators Aching    Pain Type Chronic pain    Pain Onset More than a month ago    Pain Frequency Constant           [facility]  Patient seen for aquatic therapy today. Treatment took place in water 3.5-4.53feet deep depending upon activity. Pt entered and exited the pool viasteps andbil rails withdistantsupervision.  Pt performed Runner's stretch RLE 30 sec hold x 1 rep; R heel cord stretch in standing with forefoot on wall 30 sec hold x 1 rep    Pt perfomed water walkingforwards and backwards in4.8 ft of water. Forward and backward marching and forward and backward marching with arm barbells to opposite knee. Side stepping with straight leg then side stepping squat with arm barbells.  Pt performedLE exercises with buoyancy cuffs as follows:bilhip flexion, extension, abduction,hip flexion moving into extension,LE abd/addand hip external rotation with hip and knee at 90 degreesfor15reps alternating LE's. Pt able to perform with intermittent UE support.Heel raises x 15.  Pt requires aquatic therapy for the buoyancy of the water to allow her to off load her body for spinal decompression and also to reduce the weight of her RLEin the water for  decreased pain with AROM; buoyancy of water allows more freedom of movement than can be achieved with land exercise  Pt also requires viscosity of water for strengthening; water allows reduced gait deviation due to reduced joint loading through buoyancy which facilitates improvedposture without excess stress & pain       PT Long Term Goals - 12/05/20 2011      PT LONG TERM GOAL #1   Title Pt will be independent with  aquatic HEP in order to indicate improved functional mobilty and general conditioning.    Time 6    Period Weeks    Status New    Target Date 01/23/21      PT LONG TERM GOAL #2   Title Pt will report improved RLE strength and AROM to be able to bring her RLE up to independently put lotion on R lower leg and foot.    Time 6    Period Weeks    Status New  Target Date 01/23/21      PT LONG TERM GOAL #3   Title Pt will demonstrate improved endurance by participating in 45" aquatic therapy session with minimal c/o fatigue.    Time 6    Period Weeks    Status New    Target Date 01/23/21      PT LONG TERM GOAL #4   Title Pt will report decreased back pain to </= 2/10 intensity prior to start of aquatic therapy session.    Time 6    Period Weeks    Status New    Target Date 01/23/21                 Plan - 01/12/21 1454    Clinical Impression Statement Pt continues to have pain and stiffness.  Continue to reinforce need for pt to perform aquatic HEP upon d/c.  Cont per poc.    Personal Factors and Comorbidities Fitness;Comorbidity 1;Time since onset of injury/illness/exacerbation;Past/Current Experience    Comorbidities lumbar spondylosis, lumbosacral radiculopathy L4, generalized OA    Examination-Activity Limitations Locomotion Level;Bathing;Bend;Dressing;Stand;Stairs;Squat    Examination-Participation Restrictions Cleaning;Community Activity;Laundry;Shop;Interpersonal Relationship    Stability/Clinical Decision Making Stable/Uncomplicated    Rehab  Potential Good    PT Frequency 1x / week    PT Duration 6 weeks   + eval for 7 visits total   PT Treatment/Interventions Aquatic Therapy    PT Next Visit Plan aquatic therapy    Consulted and Agree with Plan of Care Patient           Patient will benefit from skilled therapeutic intervention in order to improve the following deficits and impairments:  Difficulty walking,Pain,Decreased strength,Decreased endurance,Decreased activity tolerance,Impaired flexibility  Visit Diagnosis: Chronic right-sided low back pain without sciatica  Muscle weakness (generalized)  Other abnormalities of gait and mobility     Problem List Patient Active Problem List   Diagnosis Date Noted  . Flat foot 05/07/2019  . Abnormality of gait 02/22/2019  . Lumbosacral radiculopathy at L4 06/09/2018  . Generalized anxiety disorder   . Pain   . Fall   . Labile blood pressure   . Neuropathic pain   . Therapeutic opioid induced constipation   . Urinary retention   . Lumbar radiculopathy 02/21/2018  . PCOS (polycystic ovarian syndrome)   . Obesity   . Depression   . Generalized OA   . Postoperative pain   . Acute blood loss anemia   . Status post surgery 02/17/2018  . Lumbar radiculopathy, chronic 02/17/2018  . Primary localized osteoarthritis of right hip 04/21/2016  . Sinusitis 06/25/2015  . Cough 06/25/2015  . Essential hypertension 04/03/2015  . Dehydration 04/03/2015  . CAP (community acquired pneumonia) 03/31/2015  . Right lower lobe pneumonia 03/31/2015  . Fever 03/31/2015  . Pregnancy 04/29/2014  . Miscarriage   . Incomplete miscarriage 04/02/2011    Class: Acute  . Deep vein thrombosis (DVT) ([place]) 04/02/2011    Class: Chronic    Ashley [name], [place] 01/12/21 2:56 PM Phone: [phone] Fax: [facility] [address] [place], [place],  [address] Phone: [phone]   Fax:  [phone]  Name: Ashley [name] MRN: [id number] Date of Birth: 07-Oct-1975

## 2021-01-12 NOTE — Telephone Encounter (Signed)
Patient is f/u on a discussion consultation w/ colleagues for surgery on right foot. Please call

## 2021-01-16 ENCOUNTER — Telehealth: Payer: Self-pay | Admitting: Podiatry

## 2021-01-16 DIAGNOSIS — I10 Essential (primary) hypertension: Secondary | ICD-10-CM | POA: Diagnosis not present

## 2021-01-16 NOTE — Telephone Encounter (Signed)
Patient came in today due to Dr.Evans not following up with her from her 4/4 appointment in reference to removing a screw. Reached out to Dr. Amalia Hailey who advised that he will discuss patient at physician meeting and to schedule her after that meeting. Patient has been scheduled for 5/9

## 2021-01-19 DIAGNOSIS — G4700 Insomnia, unspecified: Secondary | ICD-10-CM | POA: Diagnosis not present

## 2021-01-19 DIAGNOSIS — M47817 Spondylosis without myelopathy or radiculopathy, lumbosacral region: Secondary | ICD-10-CM | POA: Diagnosis not present

## 2021-01-19 DIAGNOSIS — G894 Chronic pain syndrome: Secondary | ICD-10-CM | POA: Diagnosis not present

## 2021-01-20 ENCOUNTER — Encounter: Payer: Self-pay | Admitting: Physical Therapy

## 2021-01-20 ENCOUNTER — Other Ambulatory Visit: Payer: Self-pay

## 2021-01-20 ENCOUNTER — Ambulatory Visit: Payer: Medicare Other | Attending: Physical Medicine and Rehabilitation | Admitting: Physical Therapy

## 2021-01-20 DIAGNOSIS — J3089 Other allergic rhinitis: Secondary | ICD-10-CM | POA: Diagnosis not present

## 2021-01-20 DIAGNOSIS — M545 Low back pain, unspecified: Secondary | ICD-10-CM | POA: Insufficient documentation

## 2021-01-20 DIAGNOSIS — R2689 Other abnormalities of gait and mobility: Secondary | ICD-10-CM | POA: Diagnosis not present

## 2021-01-20 DIAGNOSIS — M6281 Muscle weakness (generalized): Secondary | ICD-10-CM | POA: Insufficient documentation

## 2021-01-20 DIAGNOSIS — J3081 Allergic rhinitis due to animal (cat) (dog) hair and dander: Secondary | ICD-10-CM | POA: Diagnosis not present

## 2021-01-20 DIAGNOSIS — G8929 Other chronic pain: Secondary | ICD-10-CM | POA: Insufficient documentation

## 2021-01-20 DIAGNOSIS — J301 Allergic rhinitis due to pollen: Secondary | ICD-10-CM | POA: Diagnosis not present

## 2021-01-20 NOTE — Therapy (Signed)
[facility] [address] [place], [place], [address] Phone: [phone]   Fax:  [phone]  Physical Therapy Treatment  Patient Details  Name: Ashley [name] MRN: [id number] Date of Birth: 1976-05-27 Referring Provider (PT): Maude Leriche, PA-C   Encounter Date: 01/20/2021   PT End of Session - 01/20/21 1547    Visit Number 6    Number of Visits 7   eval + 6 aquatic visits   Authorization Type [employer] medicare    Authorization Time Period 12-04-20 - 02-17-21    PT Start Time 0930    PT Stop Time 1015    PT Time Calculation (min) 45 min    Equipment Utilized During Treatment Other (comment)   arm bar bells, ankle buoyancy cuffs   Activity Tolerance Patient tolerated treatment well    Behavior During Therapy [facility] for tasks assessed/performed           Past Medical History:  Diagnosis Date  . Arthritis    knees, back  . 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 Complication of anesthesia   . Depression   . History of pneumonia   . Hypertension   . Lumbar spondylosis   . PCOS (polycystic ovarian syndrome)   . Pneumonia    2016  . PONV (postoperative nausea and vomiting)     Past Surgical History:  Procedure Laterality Date  . ABDOMINAL EXPOSURE N/A 02/17/2018   Procedure: ABDOMINAL EXPOSURE;  Surgeon: Rosetta Posner, MD;  Location: [facility] OR;  Service: Vascular;  Laterality: N/A;  . ANKLE SURGERY     x 2  . ANTERIOR LAT LUMBAR FUSION N/A 02/17/2018   Procedure: Lumbar three-four Lumbar four-five  Anterolateral lumbar interbody fusion;  Surgeon: Kristeen Miss, MD;  Location: [place];  Service: Neurosurgery;  Laterality: N/A;  . ANTERIOR LUMBAR FUSION N/A 02/17/2018   Procedure: Lumbar five-Sacral one Anterior lumbar interbody fusion;  Surgeon: Kristeen Miss, MD;  Location: [place];  Service: Neurosurgery;  Laterality: N/A;  . APPLICATION OF ROBOTIC ASSISTANCE FOR SPINAL PROCEDURE N/A 02/17/2018   Procedure:  APPLICATION OF ROBOTIC ASSISTANCE FOR SPINAL PROCEDURE;  Surgeon: Kristeen Miss, MD;  Location: [place];  Service: Neurosurgery;  Laterality: N/A;  . APPLICATION OF ROBOTIC ASSISTANCE FOR SPINAL PROCEDURE N/A 06/09/2018   Procedure: APPLICATION OF ROBOTIC ASSISTANCE FOR SPINAL PROCEDURE;  Surgeon: Kristeen Miss, MD;  Location: [place];  Service: Neurosurgery;  Laterality: N/A;  . BACK SURGERY     2017  discectomy  . CARPAL TUNNEL RELEASE Left 07/04/2014   Procedure: LEFT CARPAL TUNNEL RELEASE;  Surgeon: Ninetta Lights, MD;  Location: [place];  Service: Orthopedics;  Laterality: Left;  . CARPAL TUNNEL RELEASE Right 08/08/2014   Procedure: RIGHT CARPAL TUNNEL RELEASE;  Surgeon: Ninetta Lights, MD;  Location: [place];  Service: Orthopedics;  Laterality: Right;  . DILATION AND CURETTAGE OF UTERUS    . DILATION AND EVACUATION  04/02/2011   Procedure: DILATATION AND EVACUATION (D&E);  Surgeon: Luz Lex, MD;  Location: [facility];  Service: Gynecology;  Laterality: N/A;  dvt left mid thigh  . DORSAL COMPARTMENT RELEASE Left 07/04/2014   Procedure: LEFT DEQUERVAINS;  Surgeon: Ninetta Lights, MD;  Location: [place];  Service: Orthopedics;  Laterality: Left;  . ENDOSCOPIC PLANTAR FASCIOTOMY Left 02/01/2002  . HARDWARE REMOVAL Right 06/09/2018   Procedure: Repositioning of Right Lumbar three Right lumbar  four pedicle screw with MAZOR;  Surgeon: Ellene Route,  Sherilyn Cooter, MD;  Location[facility];  Service: Neurosurgery;  Laterality: Right;  . HYSTEROSCOPY WITH D & C  07/17/2010   with exc. endometrial polyps  . KNEE ARTHROSCOPY Right 03/07/2003; 01/14/2005; 08/31/2006  . KNEE ARTHROSCOPY Left 03/21/2003; 11/26/2004  . LUMBAR PERCUTANEOUS PEDICLE SCREW 3 LEVEL N/A 02/17/2018   Procedure: LUMBAR PERCUTANEOUS PEDICLE SCREW PLACEMENT LUMBAR THREE-SACRAL ONE;  Surgeon: Barnett Abu, MD;  Location: [facility];  Service: Neurosurgery;  Laterality: N/A;  . SHOULDER ARTHROSCOPY Right   .  TOE SURGERY Left 01/10/2003   claw toe correction 2nd, 3rd, 4th toes  . TOTAL HIP ARTHROPLASTY Right 04/21/2016  . TOTAL HIP ARTHROPLASTY Right 04/21/2016   Procedure: TOTAL HIP ARTHROPLASTY ANTERIOR APPROACH;  Surgeon: Loreta Ave, MD;  Location: [facility] OR;  Service: Orthopedics;  Laterality: Right;  . TOTAL KNEE ARTHROPLASTY Left 01/21/2010  . TOTAL KNEE ARTHROPLASTY Right 08/26/2008    There were no vitals filed for this visit.   Subjective Assessment - 01/20/21 1545    Subjective Back has been hurting more past couple days.  Feels stiff and wants to know some ideas for stretching.    Currently in Pain? Yes    Pain Score 3     Pain Location Back    Pain Orientation Lower    Pain Descriptors / Indicators Tightness;Aching    Pain Type Chronic pain    Pain Onset More than a month ago    Pain Frequency Constant    Pain Relieving Factors heat            [facility]  Patient seen for aquatic therapy today. Treatment took place in water 3.5-4.60feet deep depending upon activity. Pt entered and exited the pool viasteps andbil rails withdistantsupervision.  Pt performed Runner's stretch RLE 30 sec hold x 1 rep; R heel cord stretch in standing with forefoot on wall 30 sec hold x 1 rep.  Performed at beginning and end of session.  Pt perfomed water walkingforwards and backwards in4.8 ft of water. Forward and backward marching and forward and backward marching with arm barbells to opposite knee. Side stepping with straight leg then side stepping squat with arm barbells.  Pt performedLE exercises with buoyancy cuffs as follows:bilhip flexion, extension, abduction,LE abd/addPt able to perform with intermittent UE support.  Stepping onto/off of aquatic step x 10 alternating leading leg then stepping down/back up in same pattern.  Ai Chi posture of enclosing, soothing and freeing x 10 reps each.  Leaning with back against pool wall for knee to chest  stretch bil sides x 30 sec.  Quad stretch x 30 sec x 2 each side standing facing pool wall with ankle buoyancy cuffs to assist with knee flexion.  Pt requires aquatic therapy for the buoyancy of the water to allow her to off load her body for spinal decompression and also to reduce the weight of her RLEin the water for decreased pain with AROM; buoyancy of water allows more freedom of movement than can be achieved with land exercise  Pt also requires viscosity of water for strengthening; water allows reduced gait deviation due to reduced joint loading through buoyancy which facilitates improvedposture without excess stress & pain    PT Long Term Goals - 12/05/20 2011      PT LONG TERM GOAL #1   Title Pt will be independent with  aquatic HEP in order to indicate improved functional mobilty and general conditioning.    Time 6    Period Weeks  Status New    Target Date 01/23/21      PT LONG TERM GOAL #2   Title Pt will report improved RLE strength and AROM to be able to bring her RLE up to independently put lotion on R lower leg and foot.    Time 6    Period Weeks    Status New    Target Date 01/23/21      PT LONG TERM GOAL #3   Title Pt will demonstrate improved endurance by participating in 45" aquatic therapy session with minimal c/o fatigue.    Time 6    Period Weeks    Status New    Target Date 01/23/21      PT LONG TERM GOAL #4   Title Pt will report decreased back pain to </= 2/10 intensity prior to start of aquatic therapy session.    Time 6    Period Weeks    Status New    Target Date 01/23/21                 Plan - 01/20/21 1547    Clinical Impression Statement Pt continues with back pain and stiffness.  Discussed pt checking into options for community pool as part of HEP.  Cont per poc.    Personal Factors and Comorbidities Fitness;Comorbidity 1;Time since onset of injury/illness/exacerbation;Past/Current Experience    Comorbidities lumbar  spondylosis, lumbosacral radiculopathy L4, generalized OA    Examination-Activity Limitations Locomotion Level;Bathing;Bend;Dressing;Stand;Stairs;Squat    Examination-Participation Restrictions Cleaning;Community Activity;Laundry;Shop;Interpersonal Relationship    Stability/Clinical Decision Making Stable/Uncomplicated    Rehab Potential Good    PT Frequency 1x / week    PT Duration 6 weeks   + eval for 7 visits total   PT Treatment/Interventions Aquatic Therapy    PT Next Visit Plan aquatic therapy    Consulted and Agree with Plan of Care Patient           Patient will benefit from skilled therapeutic intervention in order to improve the following deficits and impairments:  Difficulty walking,Pain,Decreased strength,Decreased endurance,Decreased activity tolerance,Impaired flexibility  Visit Diagnosis: Chronic right-sided low back pain without sciatica  Muscle weakness (generalized)  Other abnormalities of gait and mobility     Problem List Patient Active Problem List   Diagnosis Date Noted  . Flat foot 05/07/2019  . Abnormality of gait 02/22/2019  . Lumbosacral radiculopathy at L4 06/09/2018  . Generalized anxiety disorder   . Pain   . Fall   . Labile blood pressure   . Neuropathic pain   . Therapeutic opioid induced constipation   . Urinary retention   . Lumbar radiculopathy 02/21/2018  . PCOS (polycystic ovarian syndrome)   . Obesity   . Depression   . Generalized OA   . Postoperative pain   . Acute blood loss anemia   . Status post surgery 02/17/2018  . Lumbar radiculopathy, chronic 02/17/2018  . Primary localized osteoarthritis of right hip 04/21/2016  . Sinusitis 06/25/2015  . Cough 06/25/2015  . Essential hypertension 04/03/2015  . Dehydration 04/03/2015  . CAP (community acquired pneumonia) 03/31/2015  . Right lower lobe pneumonia 03/31/2015  . Fever 03/31/2015  . Pregnancy 04/29/2014  . Miscarriage   . Incomplete miscarriage 04/02/2011    Class:  Acute  . 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

## 2021-01-21 ENCOUNTER — Ambulatory Visit (INDEPENDENT_AMBULATORY_CARE_PROVIDER_SITE_OTHER): Payer: Medicare Other | Admitting: Psychology

## 2021-01-21 DIAGNOSIS — F509 Eating disorder, unspecified: Secondary | ICD-10-CM | POA: Diagnosis not present

## 2021-01-21 DIAGNOSIS — Z79899 Other long term (current) drug therapy: Secondary | ICD-10-CM | POA: Diagnosis not present

## 2021-01-22 ENCOUNTER — Ambulatory Visit: Payer: Medicare Other | Admitting: Psychology

## 2021-01-26 ENCOUNTER — Ambulatory Visit (INDEPENDENT_AMBULATORY_CARE_PROVIDER_SITE_OTHER): Payer: Medicare Other | Admitting: Podiatry

## 2021-01-26 ENCOUNTER — Other Ambulatory Visit: Payer: Self-pay

## 2021-01-26 DIAGNOSIS — T8484XA Pain due to internal orthopedic prosthetic devices, implants and grafts, initial encounter: Secondary | ICD-10-CM

## 2021-01-26 NOTE — Progress Notes (Signed)
Subjective:  Patient presents today status post attempted removal of orthopedic screws third and fourth digit of the right foot.  DOS: 08/21/2020.  She continues to have pain and sensitivity to the third digit of the right foot secondary to the prominent orthopedic screw.    Past Medical History:  Diagnosis Date  . Arthritis    knees, back  . 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 Complication of anesthesia   . Depression   . History of pneumonia   . Hypertension   . Lumbar spondylosis   . PCOS (polycystic ovarian syndrome)   . Pneumonia    2016  . PONV (postoperative nausea and vomiting)      Objective: Physical Exam General: The patient is alert and oriented x3 in no acute distress.  Dermatology: Skin is cool, dry and supple bilateral lower extremities. Negative for open lesions or macerations.  There is some dystrophic nail to the lateral border of the left great toe where the nail avulsion site was performed with some subungual debris.  Associated tenderness to palpation  Vascular: Palpable pedal pulses bilaterally. No edema or erythema noted. Capillary refill within normal limits.  Neurological: Epicritic and protective threshold grossly intact bilaterally.   Musculoskeletal Exam: All pedal and ankle joints range of motion within normal limits bilateral. Muscle strength 5/5 in all groups bilateral.  Tenderness to palpation also noted to the lateral aspect of the right third toe.  There is a palpable bump there that is either of osseous nature or from the orthopedic screw remaining within the toe   Radiographic Exam taken last visit on: Skin marker placed to the lateral aspect of the right third toe directly overlies the end of the orthopedic screw that is somewhat laterally deviated.  Clinically this associates with the palpable prominence to the distal tuft lateral aspect of the toe.  Patient's associated symptoms are likely due to the distal portion of the  screw   Assessment: 1. s/p removal of orthopedic screws third and fourth digit right foot.  Failure to remove the orthopedic screw to the third digit right foot. DOS: 08/21/2020 2.  Synovitis/DJD bilateral ankles 3.  Onychomycosis of toenails bilateral 4.  Symptomatic screw third digit right foot   Plan of Care:  1. Patient was evaluated.  After discussing with other physicians group, I think it would be best to have a minimally invasive approach.  Trying to remove the entire screw may be unfeasible and cause more damage than good.  The only symptomatic area is the distal tip of the screw.  Recommend performing an incision over the distal tip of the toe and burring the distal tip of the screw down with a bur or Dremel.  I explained all this to the patient 2. Today we discussed the conservative versus surgical management of the presenting pathology. The patient opts for surgical management. All possible complications and details of the procedure were explained. All patient questions were answered. No guarantees were expressed or implied. 3. Authorization for surgery was initiated today. Surgery will consist of partial excision of orthopedic groove distal to the right third toe 4.  Return to clinic 1 week postop     Edrick Kins, DPM [facility]  Dr. Edrick Kins, DPM    [address]  [place], [place] [address]                Office [phone]  Fax [phone]

## 2021-01-27 ENCOUNTER — Ambulatory Visit: Payer: Medicare Other | Admitting: Physical Therapy

## 2021-01-27 ENCOUNTER — Encounter: Payer: Self-pay | Admitting: Physical Therapy

## 2021-01-27 ENCOUNTER — Telehealth: Payer: Self-pay | Admitting: Urology

## 2021-01-27 DIAGNOSIS — J3089 Other allergic rhinitis: Secondary | ICD-10-CM | POA: Diagnosis not present

## 2021-01-27 DIAGNOSIS — R2689 Other abnormalities of gait and mobility: Secondary | ICD-10-CM | POA: Diagnosis not present

## 2021-01-27 DIAGNOSIS — M545 Low back pain, unspecified: Secondary | ICD-10-CM | POA: Diagnosis not present

## 2021-01-27 DIAGNOSIS — G8929 Other chronic pain: Secondary | ICD-10-CM | POA: Diagnosis not present

## 2021-01-27 DIAGNOSIS — J3081 Allergic rhinitis due to animal (cat) (dog) hair and dander: Secondary | ICD-10-CM | POA: Diagnosis not present

## 2021-01-27 DIAGNOSIS — M6281 Muscle weakness (generalized): Secondary | ICD-10-CM

## 2021-01-27 DIAGNOSIS — J301 Allergic rhinitis due to pollen: Secondary | ICD-10-CM | POA: Diagnosis not present

## 2021-01-27 NOTE — Patient Instructions (Signed)
Access Code: [email or web address] URL: [email or web address] Date: 01/27/2021 Prepared by: Nita Sells  Exercises Gastroc Stretch on Wall - 1 x daily - 2 x weekly - 1 reps - 30 seconds hold Gastroc Stretch with Foot at [place] - 1 x daily - 2 x weekly - 1 reps - 30 seconds hold Forward Walking - 1 x daily - 2 x weekly - 4 sets Backward Walking - 1 x daily - 2 x weekly - 4 sets Forward March - 1 x daily - 2 x weekly - 4 sets Backward March - 1 x daily - 2 x weekly - 4 sets Forward March with Opposite Arm Knee Taps and Hand Floats - 1 x daily - 2 x weekly - 4 sets Backward March with Opposite Arm Knee Taps and Hand Floats - 1 x daily - 2 x weekly - 4 sets Bilateral Shoulder Horizontal Abduction Adduction AROM - 1 x daily - 2 x weekly - 4 sets Standing March at [facility] - 1 x daily - 2 x weekly - 15 reps Standing Hip Flexion Extension at [employer] - 1 x daily - 2 x weekly - 15 reps Standing Hip Abduction Adduction at [employer] - 1 x daily - 2 x weekly - 15 reps Squat - 1 x daily - 2 x weekly - 15 reps Single Leg Squat - 1 x daily - 2 x weekly - 15 reps Enclosing - 1 x daily - 2 x weekly - 10 reps Soothing - 1 x daily - 2 x weekly - 10 reps Freeing - 1 x daily - 2 x weekly - 10 reps Supine Single Knee to Chest Stretch - 1 x daily - 5 x weekly - 3 reps - 30 second hold Supine Double Knee to Chest - 1 x daily - 5 x weekly - 3 reps - 30 seconds hold Modified Thomas Stretch - 1 x daily - 5 x weekly - 10 reps Supine Lower Trunk Rotation - 1 x daily - 5 x weekly - 10 reps

## 2021-01-27 NOTE — Telephone Encounter (Signed)
DOS - 02/05/21  REMOVAL FIXATION DEEP RIGHT --- 20680  [facility] EFFECTIVE DATE - 09/20/20   PLAN DEDUCTIBLE - $0.00 W/ $0.00 REMAINING OUT OF POCKET - $3,900.00 W/ $3,483.39 REMAINING COINSURANCE - 0% COPAY - $295    PER [employer] WEB SITE FOR CPT CODE 2068 Notification or Prior Authorization is not required for the requested services    Decision ID #:[id number]

## 2021-01-27 NOTE — Therapy (Signed)
[facility] [address] [place], [place], [address] Phone: [phone]   Fax:  [phone]  Physical Therapy Treatment  Patient Details  Name: Ashley [name] MRN: [id number] Date of Birth: [birth year] Referring Provider (PT): Maude Leriche, PA-C   Encounter Date: 01/27/2021   PT End of Session - 01/27/21 2142    Visit Number 7    Number of Visits 7   eval + 6 aquatic visits   Authorization Type [employer] medicare    Authorization Time Period 12-04-20 - 02-17-21    PT Start Time 0930    PT Stop Time 1015    PT Time Calculation (min) 45 min    Equipment Utilized During Treatment Other (comment)   arm bar bells   Activity Tolerance Patient tolerated treatment well    Behavior During Therapy [facility] for tasks assessed/performed           Past Medical History:  Diagnosis Date  . Arthritis    knees, back  . 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 Complication of anesthesia   . Depression   . History of pneumonia   . Hypertension   . Lumbar spondylosis   . PCOS (polycystic ovarian syndrome)   . Pneumonia    2016  . PONV (postoperative nausea and vomiting)     Past Surgical History:  Procedure Laterality Date  . ABDOMINAL EXPOSURE N/A 02/17/2018   Procedure: ABDOMINAL EXPOSURE;  Surgeon: Rosetta Posner, MD;  Location: [facility] OR;  Service: Vascular;  Laterality: N/A;  . ANKLE SURGERY     x 2  . ANTERIOR LAT LUMBAR FUSION N/A 02/17/2018   Procedure: Lumbar three-four Lumbar four-five  Anterolateral lumbar interbody fusion;  Surgeon: Kristeen Miss, MD;  Location: [place];  Service: Neurosurgery;  Laterality: N/A;  . ANTERIOR LUMBAR FUSION N/A 02/17/2018   Procedure: Lumbar five-Sacral one Anterior lumbar interbody fusion;  Surgeon: Kristeen Miss, MD;  Location: [place];  Service: Neurosurgery;  Laterality: N/A;  . APPLICATION OF ROBOTIC ASSISTANCE FOR SPINAL PROCEDURE N/A 02/17/2018   Procedure: APPLICATION OF ROBOTIC  ASSISTANCE FOR SPINAL PROCEDURE;  Surgeon: Kristeen Miss, MD;  Location: [place];  Service: Neurosurgery;  Laterality: N/A;  . APPLICATION OF ROBOTIC ASSISTANCE FOR SPINAL PROCEDURE N/A 06/09/2018   Procedure: APPLICATION OF ROBOTIC ASSISTANCE FOR SPINAL PROCEDURE;  Surgeon: Kristeen Miss, MD;  Location: [place];  Service: Neurosurgery;  Laterality: N/A;  . BACK SURGERY     2017  discectomy  . CARPAL TUNNEL RELEASE Left 07/04/2014   Procedure: LEFT CARPAL TUNNEL RELEASE;  Surgeon: Ninetta Lights, MD;  Location: [place];  Service: Orthopedics;  Laterality: Left;  . CARPAL TUNNEL RELEASE Right 08/08/2014   Procedure: RIGHT CARPAL TUNNEL RELEASE;  Surgeon: Ninetta Lights, MD;  Location: [place];  Service: Orthopedics;  Laterality: Right;  . DILATION AND CURETTAGE OF UTERUS    . DILATION AND EVACUATION  04/02/2011   Procedure: DILATATION AND EVACUATION (D&E);  Surgeon: Luz Lex, MD;  Location: [place] [facility];  Service: Gynecology;  Laterality: N/A;  dvt left mid thigh  . DORSAL COMPARTMENT RELEASE Left 07/04/2014   Procedure: LEFT DEQUERVAINS;  Surgeon: Ninetta Lights, MD;  Location: [place];  Service: Orthopedics;  Laterality: Left;  . ENDOSCOPIC PLANTAR FASCIOTOMY Left 02/01/2002  . HARDWARE REMOVAL Right 06/09/2018   Procedure: Repositioning of Right Lumbar three Right lumbar  four pedicle screw with MAZOR;  Surgeon: Kristeen Miss, MD;  Location: [place] [facility];  Service: Neurosurgery;  Laterality: Right;  . HYSTEROSCOPY WITH D & C  07/17/2010   with exc. endometrial polyps  . KNEE ARTHROSCOPY Right 03/07/2003; 01/14/2005; 08/31/2006  . KNEE ARTHROSCOPY Left 03/21/2003; 11/26/2004  . LUMBAR PERCUTANEOUS PEDICLE SCREW 3 LEVEL N/A 02/17/2018   Procedure: LUMBAR PERCUTANEOUS PEDICLE SCREW PLACEMENT LUMBAR THREE-SACRAL ONE;  Surgeon: Kristeen Miss, MD;  Location: [place];  Service: Neurosurgery;  Laterality: N/A;  . SHOULDER ARTHROSCOPY Right   . TOE SURGERY Left  01/10/2003   claw toe correction 2nd, 3rd, 4th toes  . TOTAL HIP ARTHROPLASTY Right 04/21/2016  . TOTAL HIP ARTHROPLASTY Right 04/21/2016   Procedure: TOTAL HIP ARTHROPLASTY ANTERIOR APPROACH;  Surgeon: Ninetta Lights, MD;  Location: [place];  Service: Orthopedics;  Laterality: Right;  . TOTAL KNEE ARTHROPLASTY Left 01/21/2010  . TOTAL KNEE ARTHROPLASTY Right 08/26/2008    There were no vitals filed for this visit.   Subjective Assessment - 01/27/21 2139    Subjective Pt reports that she is scheduled for surgery next week to address screw in toe.    Currently in Pain? Yes    Pain Score 2     Pain Location Back    Pain Orientation Lower    Pain Descriptors / Indicators Aching;Tightness    Pain Type Chronic pain    Pain Onset More than a month ago    Pain Frequency Constant           [facility]  Patient seen for aquatic therapy today. Treatment took place in water 3.5-4.16fet deep depending upon activity. Pt entered and exited the pool viasteps andbil rails withdistantsupervision.  See HEP for details on exercises performed and instructed during todays session to prepare for discharge with aquatic HEP.  Access Code: [email or web address] URL: [email or web address] Date: 01/27/2021 Prepared by: DNita Sells Exercises Gastroc Stretch on Wall - 1 x daily - 2 x weekly - 1 reps - 30 seconds hold Gastroc Stretch with Foot at [address]- 1 x daily - 2 x weekly - 1 reps - 30 seconds hold Forward Walking - 1 x daily - 2 x weekly - 4 sets Backward Walking - 1 x daily - 2 x weekly - 4 sets Forward March - 1 x daily - 2 x weekly - 4 sets Backward March - 1 x daily - 2 x weekly - 4 sets Forward March with Opposite Arm Knee Taps and Hand Floats - 1 x daily - 2 x weekly - 4 sets Backward March with Opposite Arm Knee Taps and Hand Floats - 1 x daily - 2 x weekly - 4 sets Bilateral Shoulder Horizontal Abduction Adduction AROM - 1 x daily - 2 x weekly - 4  sets Standing [facility]- 1 x daily - 2 x weekly - 15 reps Standing Hip Flexion Extension at [facility]- 1 x daily - 2 x weekly - 15 reps Standing Hip Abduction Adduction at [facility]- 1 x daily - 2 x weekly - 15 reps Squat - 1 x daily - 2 x weekly - 15 reps Single Leg Squat - 1 x daily - 2 x weekly - 15 reps Enclosing - 1 x daily - 2 x weekly - 10 reps Soothing - 1 x daily - 2 x weekly - 10 reps Freeing - 1 x daily - 2 x weekly - 10 reps Supine Single Knee to Chest Stretch - 1 x daily - 5 x weekly - 3  reps - 30 second hold Supine Double Knee to Chest - 1 x daily - 5 x weekly - 3 reps - 30 seconds hold Modified Thomas Stretch - 1 x daily - 5 x weekly - 10 reps Supine Lower Trunk Rotation - 1 x daily - 5 x weekly - 10 reps       PT Education - 01/27/21 2140    Education Details [facility] HEP for aquatics and stretches, getting clearance after surgery before beginning aquatics HEP, d/c from PT    Person(s) Educated Patient    Methods Explanation;Handout    Comprehension Verbalized understanding;Returned demonstration               PT Long Term Goals - 01/27/21 2141      PT LONG TERM GOAL #1   Title Pt will be independent with  aquatic HEP in order to indicate improved functional mobilty and general conditioning.    Time 6    Period Weeks    Status Achieved      PT LONG TERM GOAL #2   Title Pt will report improved RLE strength and AROM to be able to bring her RLE up to independently put lotion on R lower leg and foot.    Time 6    Period Weeks    Status Not Met      PT LONG TERM GOAL #3   Title Pt will demonstrate improved endurance by participating in 45" aquatic therapy session with minimal c/o fatigue.    Time 6    Period Weeks    Status Achieved      PT LONG TERM GOAL #4   Title Pt will report decreased back pain to </= 2/10 intensity prior to start of aquatic therapy session.    Time 6    Period Weeks    Status Achieved                  Plan - 01/27/21 2142    Clinical Impression Statement Pt met LTG 1, 3 and 4.  Goal 2 unmet as pt still has back and LE stiffness/decreased ROM limiting reaching her feet.  Pt is scheduled to have surgery next week.  Provided pt with aquatic HEP along with stretches on land.  Pt encouraged to complete aquatic HEP once cleared after surgery.  Pt d/c'd from PT at this time per poc.    Personal Factors and Comorbidities Fitness;Comorbidity 1;Time since onset of injury/illness/exacerbation;Past/Current Experience    Comorbidities lumbar spondylosis, lumbosacral radiculopathy L4, generalized OA    Examination-Activity Limitations Locomotion Level;Bathing;Bend;Dressing;Stand;Stairs;Squat    Examination-Participation Restrictions Cleaning;Community Activity;Laundry;Shop;Interpersonal Relationship    Stability/Clinical Decision Making Stable/Uncomplicated    Rehab Potential Good    PT Frequency 1x / week    PT Duration 6 weeks   + eval for 7 visits total   PT Treatment/Interventions Aquatic Therapy    PT Next Visit Plan aquatic therapy    PT Home Exercise Plan [facility] [email or web address]    Consulted and Agree with Plan of Care Patient           Patient will benefit from skilled therapeutic intervention in order to improve the following deficits and impairments:  Difficulty walking,Pain,Decreased strength,Decreased endurance,Decreased activity tolerance,Impaired flexibility  Visit Diagnosis: Chronic right-sided low back pain without sciatica  Muscle weakness (generalized)  Other abnormalities of gait and mobility     Problem List Patient Active Problem List   Diagnosis Date Noted  . Flat foot 05/07/2019  . Abnormality of gait 02/22/2019  .  Lumbosacral radiculopathy at L4 06/09/2018  . Generalized anxiety disorder   . Pain   . Fall   . Labile blood pressure   . Neuropathic pain   . Therapeutic opioid induced constipation   . Urinary retention   . Lumbar radiculopathy 02/21/2018  . PCOS  (polycystic ovarian syndrome)   . Obesity   . Depression   . Generalized OA   . Postoperative pain   . Acute blood loss anemia   . Status post surgery 02/17/2018  . Lumbar radiculopathy, chronic 02/17/2018  . Primary localized osteoarthritis of right hip 04/21/2016  . Sinusitis 06/25/2015  . Cough 06/25/2015  . Essential hypertension 04/03/2015  . Dehydration 04/03/2015  . CAP (community acquired pneumonia) 03/31/2015  . Right lower lobe pneumonia 03/31/2015  . Fever 03/31/2015  . Pregnancy 04/29/2014  . Miscarriage   . Incomplete miscarriage 04/02/2011    Class: Acute  . 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 [place], [place], [address] Phone: [phone]   Fax:  [phone]  Name: Ashley [name] MRN: [id number] Date of Birth: Feb 13, 1976

## 2021-01-29 DIAGNOSIS — Z23 Encounter for immunization: Secondary | ICD-10-CM | POA: Diagnosis not present

## 2021-01-29 DIAGNOSIS — E7800 Pure hypercholesterolemia, unspecified: Secondary | ICD-10-CM | POA: Diagnosis not present

## 2021-01-29 DIAGNOSIS — R61 Generalized hyperhidrosis: Secondary | ICD-10-CM | POA: Diagnosis not present

## 2021-01-29 DIAGNOSIS — G8929 Other chronic pain: Secondary | ICD-10-CM | POA: Diagnosis not present

## 2021-01-29 DIAGNOSIS — Z0000 Encounter for general adult medical examination without abnormal findings: Secondary | ICD-10-CM | POA: Diagnosis not present

## 2021-01-29 DIAGNOSIS — D509 Iron deficiency anemia, unspecified: Secondary | ICD-10-CM | POA: Diagnosis not present

## 2021-01-29 DIAGNOSIS — I10 Essential (primary) hypertension: Secondary | ICD-10-CM | POA: Diagnosis not present

## 2021-02-04 DIAGNOSIS — J3089 Other allergic rhinitis: Secondary | ICD-10-CM | POA: Diagnosis not present

## 2021-02-04 DIAGNOSIS — J301 Allergic rhinitis due to pollen: Secondary | ICD-10-CM | POA: Diagnosis not present

## 2021-02-04 DIAGNOSIS — J3081 Allergic rhinitis due to animal (cat) (dog) hair and dander: Secondary | ICD-10-CM | POA: Diagnosis not present

## 2021-02-05 ENCOUNTER — Encounter: Payer: Self-pay | Admitting: Podiatry

## 2021-02-05 DIAGNOSIS — M25571 Pain in right ankle and joints of right foot: Secondary | ICD-10-CM | POA: Diagnosis not present

## 2021-02-05 DIAGNOSIS — T8489XD Other specified complication of internal orthopedic prosthetic devices, implants and grafts, subsequent encounter: Secondary | ICD-10-CM | POA: Diagnosis not present

## 2021-02-05 DIAGNOSIS — Z4889 Encounter for other specified surgical aftercare: Secondary | ICD-10-CM | POA: Diagnosis not present

## 2021-02-09 ENCOUNTER — Other Ambulatory Visit: Payer: Self-pay

## 2021-02-09 ENCOUNTER — Encounter: Payer: Medicare Other | Attending: Surgery | Admitting: Skilled Nursing Facility1

## 2021-02-09 DIAGNOSIS — E669 Obesity, unspecified: Secondary | ICD-10-CM | POA: Diagnosis not present

## 2021-02-09 DIAGNOSIS — J301 Allergic rhinitis due to pollen: Secondary | ICD-10-CM | POA: Diagnosis not present

## 2021-02-09 DIAGNOSIS — J3081 Allergic rhinitis due to animal (cat) (dog) hair and dander: Secondary | ICD-10-CM | POA: Diagnosis not present

## 2021-02-09 DIAGNOSIS — J3089 Other allergic rhinitis: Secondary | ICD-10-CM | POA: Diagnosis not present

## 2021-02-09 NOTE — Progress Notes (Signed)
Pre-Operative Nutrition Class:    Patient was seen on 02/09/2021 for Pre-Operative Bariatric Surgery Education at the [facility].    Surgery date:  Surgery type: sleeve Start weight at [facility]: 298 Weight today: 304.2  Samples given per MNT protocol. Patient educated on appropriate usage: Bariatric advantage Vitamins Multivitamin Lot # [id number] Exp: 09/2021  procare Vitamins Calcium  Lot # [id number] Exp: 01/23   Protein  Shake Lot #  [id number] Exp:  12/2021  The following the learning objectives were met by the patient during this course:  Identify Pre-Op Dietary Goals and will begin 2 weeks pre-operatively  Identify appropriate sources of fluids and proteins   State protein recommendations and appropriate sources pre and post-operatively  Identify Post-Operative Dietary Goals and will follow for 2 weeks post-operatively  Identify appropriate multivitamin and calcium sources  Describe the need for physical activity post-operatively and will follow MD recommendations  State when to call healthcare provider regarding medication questions or post-operative complications  When having a diagnosis of diabetes understanding hypoglycemia symptoms and the inclusion of 1 complex carbohydrate per meal  Handouts given during class include:  Pre-Op Bariatric Surgery Diet Handout  Protein Shake Handout  Post-Op Bariatric Surgery Nutrition Handout  BELT Program Information Flyer  Support Group Information Flyer  [facility] Bariatric Supplements Price List  Follow-Up Plan: Patient will follow-up at [facility] 2 weeks post operatively for diet advancement per MD.   

## 2021-02-11 ENCOUNTER — Other Ambulatory Visit: Payer: Self-pay

## 2021-02-11 ENCOUNTER — Ambulatory Visit (INDEPENDENT_AMBULATORY_CARE_PROVIDER_SITE_OTHER): Payer: Medicare Other | Admitting: Podiatry

## 2021-02-11 ENCOUNTER — Ambulatory Visit (INDEPENDENT_AMBULATORY_CARE_PROVIDER_SITE_OTHER): Payer: Medicare Other

## 2021-02-11 DIAGNOSIS — Z9889 Other specified postprocedural states: Secondary | ICD-10-CM

## 2021-02-11 NOTE — Progress Notes (Signed)
   Subjective:  Patient presents today status post excision of the distal portion of an orthopedic screw to the third digit right foot. DOS: 02/05/2021.  Overall the patient states that she is doing well.  She is kept the dressings clean dry and intact.  No new complaints at this time  Past Medical History:  Diagnosis Date  . Arthritis    knees, back  . 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 Complication of anesthesia   . Depression   . History of pneumonia   . Hypertension   . Lumbar spondylosis   . PCOS (polycystic ovarian syndrome)   . Pneumonia    2016  . PONV (postoperative nausea and vomiting)       Objective/Physical Exam Neurovascular status intact.  Skin incisions appear to be well coapted with sutures intact. No sign of infectious process noted. No dehiscence. No active bleeding noted.  Negative for any significant edema noted to the surgical extremity.  Radiographic Exam:  Orthopedic screw to the right third digit is shorter and the distal portion of the screw was gone compared to prior x-rays.  Assessment: 1. s/p partial removal distal portion of the orthopedic screw right third digit. DOS: 02/05/2021   Plan of Care:  1. Patient was evaluated. X-rays reviewed 2.  Continue postsurgical shoe. 3.  Recommend antibiotic ointment and a Band-Aid over the incision site 4.  Return to clinic in 1 week for suture removal   Edrick Kins, DPM [facility]  Dr. Edrick Kins, DPM    [address], [place] [address]                Office [phone]  Fax [phone]

## 2021-02-17 DIAGNOSIS — I10 Essential (primary) hypertension: Secondary | ICD-10-CM | POA: Diagnosis not present

## 2021-02-18 ENCOUNTER — Ambulatory Visit (INDEPENDENT_AMBULATORY_CARE_PROVIDER_SITE_OTHER): Payer: Medicare Other | Admitting: Podiatry

## 2021-02-18 ENCOUNTER — Other Ambulatory Visit: Payer: Self-pay

## 2021-02-18 DIAGNOSIS — J3081 Allergic rhinitis due to animal (cat) (dog) hair and dander: Secondary | ICD-10-CM | POA: Diagnosis not present

## 2021-02-18 DIAGNOSIS — Z9889 Other specified postprocedural states: Secondary | ICD-10-CM

## 2021-02-18 DIAGNOSIS — J3089 Other allergic rhinitis: Secondary | ICD-10-CM | POA: Diagnosis not present

## 2021-02-18 DIAGNOSIS — J301 Allergic rhinitis due to pollen: Secondary | ICD-10-CM | POA: Diagnosis not present

## 2021-02-24 DIAGNOSIS — J301 Allergic rhinitis due to pollen: Secondary | ICD-10-CM | POA: Diagnosis not present

## 2021-02-24 DIAGNOSIS — J3089 Other allergic rhinitis: Secondary | ICD-10-CM | POA: Diagnosis not present

## 2021-02-24 DIAGNOSIS — J3081 Allergic rhinitis due to animal (cat) (dog) hair and dander: Secondary | ICD-10-CM | POA: Diagnosis not present

## 2021-03-01 NOTE — Progress Notes (Signed)
   Subjective:  Patient presents today status post excision of the distal portion of an orthopedic screw to the third digit right foot. DOS: 02/05/2021.  Overall the patient states that she is doing well.  She states there is significant improvement around the toe and there is only some slight tenderness at the incision site.  Past Medical History:  Diagnosis Date   Arthritis    knees, back   Carpal tunnel syndrome of right wrist 05/8118   Complication of anesthesia    Depression    History of pneumonia    Hypertension    Lumbar spondylosis    PCOS (polycystic ovarian syndrome)    Pneumonia    2016   PONV (postoperative nausea and vomiting)       Objective/Physical Exam Neurovascular status intact.  Skin incisions appear to be well coapted with sutures intact. No sign of infectious process noted. No dehiscence. No active bleeding noted.  Negative for any significant edema noted to the surgical extremity.  Radiographic Exam taken last visit:  Orthopedic screw to the right third digit is shorter and the distal portion of the screw was gone compared to prior x-rays.  Assessment: 1. s/p partial removal distal portion of the orthopedic screw right third digit. DOS: 02/05/2021   Plan of Care:  1. Patient was evaluated.  2.  Sutures removed today 3.  Patient may slowly transition out of the postsurgical shoe into good supportive soft sneakers that allow plenty of room in the toebox area 4.  Return to clinic 6 weeks  Edrick Kins, DPM [facility]  Dr. Edrick Kins, DPM    [address], [place] [address]                Office [phone]  Fax [phone]

## 2021-03-03 DIAGNOSIS — J3089 Other allergic rhinitis: Secondary | ICD-10-CM | POA: Diagnosis not present

## 2021-03-03 DIAGNOSIS — J301 Allergic rhinitis due to pollen: Secondary | ICD-10-CM | POA: Diagnosis not present

## 2021-03-03 DIAGNOSIS — J3081 Allergic rhinitis due to animal (cat) (dog) hair and dander: Secondary | ICD-10-CM | POA: Diagnosis not present

## 2021-03-04 DIAGNOSIS — G8929 Other chronic pain: Secondary | ICD-10-CM | POA: Diagnosis not present

## 2021-03-04 DIAGNOSIS — I10 Essential (primary) hypertension: Secondary | ICD-10-CM | POA: Diagnosis not present

## 2021-03-04 DIAGNOSIS — G4700 Insomnia, unspecified: Secondary | ICD-10-CM | POA: Diagnosis not present

## 2021-03-04 DIAGNOSIS — D509 Iron deficiency anemia, unspecified: Secondary | ICD-10-CM | POA: Diagnosis not present

## 2021-03-04 DIAGNOSIS — E7800 Pure hypercholesterolemia, unspecified: Secondary | ICD-10-CM | POA: Diagnosis not present

## 2021-03-06 DIAGNOSIS — S76012A Strain of muscle, fascia and tendon of left hip, initial encounter: Secondary | ICD-10-CM | POA: Diagnosis not present

## 2021-03-09 ENCOUNTER — Encounter: Payer: Medicare Other | Admitting: Podiatry

## 2021-03-09 DIAGNOSIS — J3089 Other allergic rhinitis: Secondary | ICD-10-CM | POA: Diagnosis not present

## 2021-03-09 DIAGNOSIS — J3081 Allergic rhinitis due to animal (cat) (dog) hair and dander: Secondary | ICD-10-CM | POA: Diagnosis not present

## 2021-03-09 DIAGNOSIS — J301 Allergic rhinitis due to pollen: Secondary | ICD-10-CM | POA: Diagnosis not present

## 2021-03-11 DIAGNOSIS — M1612 Unilateral primary osteoarthritis, left hip: Secondary | ICD-10-CM | POA: Diagnosis not present

## 2021-03-16 ENCOUNTER — Ambulatory Visit (INDEPENDENT_AMBULATORY_CARE_PROVIDER_SITE_OTHER): Payer: Medicare Other | Admitting: Podiatry

## 2021-03-16 ENCOUNTER — Other Ambulatory Visit: Payer: Self-pay

## 2021-03-16 DIAGNOSIS — G894 Chronic pain syndrome: Secondary | ICD-10-CM | POA: Diagnosis not present

## 2021-03-16 DIAGNOSIS — Z9889 Other specified postprocedural states: Secondary | ICD-10-CM

## 2021-03-16 DIAGNOSIS — G4700 Insomnia, unspecified: Secondary | ICD-10-CM | POA: Diagnosis not present

## 2021-03-16 DIAGNOSIS — M47817 Spondylosis without myelopathy or radiculopathy, lumbosacral region: Secondary | ICD-10-CM | POA: Diagnosis not present

## 2021-03-16 NOTE — Progress Notes (Signed)
   Subjective:  Patient presents today status post excision of the distal portion of an orthopedic screw to the third digit right foot. DOS: 02/05/2021.  Overall the patient states that she is doing very well.  She does have some slight sensitivity to the distal tip of the toe.  No new complaints at this time  Past Medical History:  Diagnosis Date   Arthritis    knees, back   Carpal tunnel syndrome of right wrist 56/8616   Complication of anesthesia    Depression    History of pneumonia    Hypertension    Lumbar spondylosis    PCOS (polycystic ovarian syndrome)    Pneumonia    2016   PONV (postoperative nausea and vomiting)       Objective/Physical Exam Neurovascular status intact.  Skin incisions appear to be well coapted and healed. No sign of infectious process noted. No dehiscence. No active bleeding noted.  Negative for any significant edema noted to the surgical extremity.  There is some slight sensitivity with palpation over the distal tip of the toe   Assessment: 1. s/p partial removal distal portion of the orthopedic screw right third digit. DOS: 02/05/2021   Plan of Care:  1. Patient was evaluated.  2.  Recommend daily massage of the toe using OTC Voltaren topical gel to help desensitize the toe 3.  Recommend wide fitting shoes that do not constrict the toebox area 4.  Patient may resume full activity no restrictions 5.  Return to clinic as needed  Edrick Kins, DPM [facility]  Dr. Edrick Kins, DPM    [address], [place] [address]                Office [phone]  Fax [phone]

## 2021-03-19 DIAGNOSIS — R140 Abdominal distension (gaseous): Secondary | ICD-10-CM | POA: Diagnosis not present

## 2021-03-19 DIAGNOSIS — T753XXA Motion sickness, initial encounter: Secondary | ICD-10-CM | POA: Diagnosis not present

## 2021-03-19 DIAGNOSIS — I10 Essential (primary) hypertension: Secondary | ICD-10-CM | POA: Diagnosis not present

## 2021-03-20 DIAGNOSIS — Z20822 Contact with and (suspected) exposure to covid-19: Secondary | ICD-10-CM | POA: Diagnosis not present

## 2021-03-20 DIAGNOSIS — J301 Allergic rhinitis due to pollen: Secondary | ICD-10-CM | POA: Diagnosis not present

## 2021-03-20 DIAGNOSIS — J3089 Other allergic rhinitis: Secondary | ICD-10-CM | POA: Diagnosis not present

## 2021-03-20 DIAGNOSIS — J3081 Allergic rhinitis due to animal (cat) (dog) hair and dander: Secondary | ICD-10-CM | POA: Diagnosis not present

## 2021-03-21 DIAGNOSIS — Z20822 Contact with and (suspected) exposure to covid-19: Secondary | ICD-10-CM | POA: Diagnosis not present

## 2021-03-21 DIAGNOSIS — Z03818 Encounter for observation for suspected exposure to other biological agents ruled out: Secondary | ICD-10-CM | POA: Diagnosis not present

## 2021-03-30 DIAGNOSIS — J3081 Allergic rhinitis due to animal (cat) (dog) hair and dander: Secondary | ICD-10-CM | POA: Diagnosis not present

## 2021-03-30 DIAGNOSIS — J301 Allergic rhinitis due to pollen: Secondary | ICD-10-CM | POA: Diagnosis not present

## 2021-03-30 DIAGNOSIS — J3089 Other allergic rhinitis: Secondary | ICD-10-CM | POA: Diagnosis not present

## 2021-04-01 ENCOUNTER — Other Ambulatory Visit (HOSPITAL_COMMUNITY): Payer: Self-pay

## 2021-04-02 DIAGNOSIS — E282 Polycystic ovarian syndrome: Secondary | ICD-10-CM

## 2021-04-02 DIAGNOSIS — M792 Neuralgia and neuritis, unspecified: Secondary | ICD-10-CM

## 2021-04-02 DIAGNOSIS — F32A Depression, unspecified: Secondary | ICD-10-CM

## 2021-04-02 DIAGNOSIS — R52 Pain, unspecified: Secondary | ICD-10-CM

## 2021-04-02 DIAGNOSIS — G8918 Other acute postprocedural pain: Secondary | ICD-10-CM

## 2021-04-02 DIAGNOSIS — R339 Retention of urine, unspecified: Secondary | ICD-10-CM

## 2021-04-02 DIAGNOSIS — M159 Polyosteoarthritis, unspecified: Secondary | ICD-10-CM

## 2021-04-02 DIAGNOSIS — D62 Acute posthemorrhagic anemia: Secondary | ICD-10-CM

## 2021-04-02 DIAGNOSIS — E669 Obesity, unspecified: Secondary | ICD-10-CM | POA: Insufficient documentation

## 2021-04-02 DIAGNOSIS — K5903 Drug induced constipation: Secondary | ICD-10-CM

## 2021-04-02 DIAGNOSIS — R0989 Other specified symptoms and signs involving the circulatory and respiratory systems: Secondary | ICD-10-CM

## 2021-04-02 DIAGNOSIS — T402X5A Adverse effect of other opioids, initial encounter: Secondary | ICD-10-CM

## 2021-04-02 DIAGNOSIS — F411 Generalized anxiety disorder: Secondary | ICD-10-CM

## 2021-04-02 DIAGNOSIS — W19XXXA Unspecified fall, initial encounter: Secondary | ICD-10-CM

## 2021-04-02 NOTE — Progress Notes (Signed)
Pt. Needs orders for upcoming surgery. ?

## 2021-04-02 NOTE — Patient Instructions (Addendum)
DUE TO COVID-19 ONLY ONE VISITOR IS ALLOWED TO COME WITH YOU AND STAY IN THE WAITING ROOM ONLY DURING PRE OP AND PROCEDURE DAY OF SURGERY. THE 1 VISITOR  MAY VISIT WITH YOU AFTER SURGERY IN YOUR PRIVATE ROOM DURING VISITING HOURS ONLY!  YOU NEED TO HAVE A COVID 19 TEST ON: 04/10/21 @  9:00 AM, THIS TEST MUST BE DONE BEFORE SURGERY,  COVID TESTING SITE [place] [address], IT IS ON THE RIGHT GOING OUT [address] APPROXIMATELY  2 MINUTES PAST [employer] ON THE RIGHT. ONCE YOUR COVID TEST IS COMPLETED,  PLEASE BEGIN THE QUARANTINE INSTRUCTIONS AS OUTLINED IN YOUR HANDOUT.               Ashley [name]   Your procedure is scheduled on: 04/14/21   Report to [facility] Main  Entrance   Report to a short stay at : 5:15 AM     Call this number if you have problems the morning of surgery [phone]    Remember: Do not eat food or drink liquids :After Midnight.   BRUSH YOUR TEETH MORNING OF SURGERY AND RINSE YOUR MOUTH OUT, NO CHEWING GUM CANDY OR MINTS.    Take these medicines the morning of surgery with A SIP OF WATER: pregabalin,bupropion,cetirizine,amlodipine,vyvanse.Use inhalers and flonase as usual.                               You may not have any metal on your body including hair pins and              piercings  Do not wear jewelry, make-up, lotions, powders or perfumes, deodorant             Do not wear nail polish on your fingernails.  Do not shave  48 hours prior to surgery.    Do not bring valuables to the hospital. [place].  Contacts, dentures or bridgework may not be worn into surgery.  Leave suitcase in the car. After surgery it may be brought to your room.     Patients discharged the day of surgery will not be allowed to drive home. IF YOU ARE HAVING SURGERY AND GOING HOME THE SAME DAY, YOU MUST HAVE AN ADULT TO DRIVE YOU HOME AND BE WITH YOU FOR 24 HOURS. YOU MAY GO HOME BY TAXI  OR UBER OR ORTHERWISE, BUT AN ADULT MUST ACCOMPANY YOU HOME AND STAY WITH YOU FOR 24 HOURS.  Name and phone number of your driver:  Special Instructions: N/A              Please read over the following fact sheets you were given: _____________________________________________________________________           [facility] - Preparing for [facility] Before surgery, you can play an important role.  Because skin is not sterile, your skin needs to be as free of germs as possible.  You can reduce the number of germs on your skin by washing with CHG (chlorahexidine gluconate) soap before surgery.  CHG is an antiseptic cleaner which kills germs and bonds with the skin to continue killing germs even after washing. Please DO NOT use if you have an allergy to CHG or antibacterial soaps.  If your skin becomes reddened/irritated stop using the CHG and inform your nurse  when you arrive at Short Stay. Do not shave (including legs and underarms) for at least 48 hours prior to the first CHG shower.  You may shave your face/neck. Please follow these instructions carefully:  1.  Shower with CHG Soap the night before surgery and the  morning of Surgery.  2.  If you choose to wash your hair, wash your hair first as usual with your  normal  shampoo.  3.  After you shampoo, rinse your hair and body thoroughly to remove the  shampoo.                           4.  Use CHG as you would any other liquid soap.  You can apply chg directly  to the skin and wash                       Gently with a scrungie or clean washcloth.  5.  Apply the CHG Soap to your body ONLY FROM THE NECK DOWN.   Do not use on face/ open                           Wound or open sores. Avoid contact with eyes, ears mouth and genitals (private parts).                       Wash face,  Genitals (private parts) with your normal soap.             6.  Wash thoroughly, paying special attention to the area where your surgery  will be performed.  7.  Thoroughly  rinse your body with warm water from the neck down.  8.  DO NOT shower/wash with your normal soap after using and rinsing off  the CHG Soap.                9.  Pat yourself dry with a clean towel.            10.  Wear clean pajamas.            11.  Place clean sheets on your bed the night of your first shower and do not  sleep with pets. Day of Surgery : Do not apply any lotions/deodorants the morning of surgery.  Please wear clean clothes to the hospital/surgery center.  FAILURE TO FOLLOW THESE INSTRUCTIONS MAY RESULT IN THE CANCELLATION OF YOUR SURGERY PATIENT SIGNATURE_________________________________  NURSE SIGNATURE__________________________________  ________________________________________________________________________

## 2021-04-03 ENCOUNTER — Encounter (HOSPITAL_COMMUNITY): Payer: Self-pay

## 2021-04-03 ENCOUNTER — Encounter (HOSPITAL_COMMUNITY)
Admission: RE | Admit: 2021-04-03 | Discharge: 2021-04-03 | Disposition: A | Discharge status: Home / Self Care | Payer: Medicare Other | Source: Ambulatory Visit | Attending: Surgery | Admitting: Surgery

## 2021-04-03 ENCOUNTER — Other Ambulatory Visit: Payer: Self-pay

## 2021-04-03 DIAGNOSIS — Z01812 Encounter for preprocedural laboratory examination: Secondary | ICD-10-CM | POA: Insufficient documentation

## 2021-04-03 LAB — BASIC METABOLIC PANEL WITH GFR
Anion gap: 7 (ref 5–15)
BUN: 10 mg/dL (ref 6–20)
CO2: 27 mmol/L (ref 22–32)
Calcium: 8.7 mg/dL — ABNORMAL LOW (ref 8.9–10.3)
Chloride: 103 mmol/L (ref 98–111)
Creatinine, Ser: 0.81 mg/dL (ref 0.44–1.00)
GFR, Estimated: >60 mL/min
Glucose, Bld: 86 mg/dL (ref 70–99)
Potassium: 3.6 mmol/L (ref 3.5–5.1)
Sodium: 137 mmol/L (ref 135–145)

## 2021-04-03 LAB — CBC
HCT: 37.5 % (ref 36.0–46.0)
Hemoglobin: 11.6 g/dL — ABNORMAL LOW (ref 12.0–15.0)
MCH: 24.8 pg — ABNORMAL LOW (ref 26.0–34.0)
MCHC: 30.9 g/dL (ref 30.0–36.0)
MCV: 80.1 fL (ref 80.0–100.0)
Platelets: 226 10*3/uL (ref 150–400)
RBC: 4.68 MIL/uL (ref 3.87–5.11)
RDW: 16.9 % — ABNORMAL HIGH (ref 11.5–15.5)
WBC: 6.4 10*3/uL (ref 4.0–10.5)
nRBC: 0 % (ref 0.0–0.2)

## 2021-04-03 NOTE — Progress Notes (Signed)
COVID Vaccine Completed: Yes Date COVID Vaccine completed: 01/2021 [place] COVID vaccine manufacturer: [place]      PCP - Earlie Server Scifres: [facility] Cardiologist -   Chest x-ray -  EKG - 06/20/20: EPIC Stress Test -  ECHO -  Cardiac Cath -  Pacemaker/ICD device last checked:  Sleep Study -  CPAP -   Fasting Blood Sugar -  Checks Blood Sugar _____ times a day  Blood Thinner Instructions: Aspirin Instructions: Last Dose:  Anesthesia review:   Patient denies shortness of breath, fever, cough and chest pain at PAT appointment   Patient verbalized understanding of instructions that were given to them at the PAT appointment. Patient was also instructed that they will need to review over the PAT instructions again at home before surgery.

## 2021-04-07 DIAGNOSIS — K649 Unspecified hemorrhoids: Secondary | ICD-10-CM | POA: Diagnosis not present

## 2021-04-08 DIAGNOSIS — J301 Allergic rhinitis due to pollen: Secondary | ICD-10-CM | POA: Diagnosis not present

## 2021-04-08 DIAGNOSIS — J3081 Allergic rhinitis due to animal (cat) (dog) hair and dander: Secondary | ICD-10-CM | POA: Diagnosis not present

## 2021-04-08 DIAGNOSIS — J3089 Other allergic rhinitis: Secondary | ICD-10-CM | POA: Diagnosis not present

## 2021-04-10 ENCOUNTER — Other Ambulatory Visit (HOSPITAL_COMMUNITY)
Admission: RE | Admit: 2021-04-10 | Discharge: 2021-04-10 | Disposition: A | Discharge status: Home / Self Care | Payer: Medicare Other | Source: Ambulatory Visit | Attending: Surgery | Admitting: Surgery

## 2021-04-10 ENCOUNTER — Ambulatory Visit: Payer: Self-pay | Admitting: General Surgery

## 2021-04-10 DIAGNOSIS — Z20822 Contact with and (suspected) exposure to covid-19: Secondary | ICD-10-CM | POA: Diagnosis not present

## 2021-04-10 DIAGNOSIS — Z01812 Encounter for preprocedural laboratory examination: Secondary | ICD-10-CM | POA: Diagnosis not present

## 2021-04-10 LAB — SARS CORONAVIRUS 2 (TAT 6-24 HRS): SARS Coronavirus 2: NEGATIVE

## 2021-04-13 ENCOUNTER — Telehealth (HOSPITAL_COMMUNITY): Payer: Self-pay | Admitting: *Deleted

## 2021-04-13 DIAGNOSIS — J301 Allergic rhinitis due to pollen: Secondary | ICD-10-CM | POA: Diagnosis not present

## 2021-04-13 DIAGNOSIS — J3089 Other allergic rhinitis: Secondary | ICD-10-CM | POA: Diagnosis not present

## 2021-04-13 DIAGNOSIS — J3081 Allergic rhinitis due to animal (cat) (dog) hair and dander: Secondary | ICD-10-CM | POA: Diagnosis not present

## 2021-04-13 MED ORDER — SODIUM CHLORIDE 0.9 % IV SOLN
2.0000 g | INTRAVENOUS | Status: AC
  Administered 2021-04-14: 2 g via INTRAVENOUS
  Filled 2021-04-13 (×2): qty 2

## 2021-04-13 MED ORDER — BUPIVACAINE LIPOSOME 1.3 % IJ SUSP
20.0000 mL | INTRAMUSCULAR | Status: DC
  Administered 2021-04-14: 266 mg
  Filled 2021-04-13: qty 20

## 2021-04-13 NOTE — H&P (Signed)
CC Morbid obesity for sleeve gastrectomy  HPI Ashley [name] is a 45 year old female who was seen in the office initially and was noted to not be diabetic but have hypertension with some fluid retention. There is no obstructive sleep apnea. 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 She has had back surgery by Ashley [name] in 2019. Today's weight is 308 ---BMI of 43. He is 5 feet 11-1/2. We discussed her upcoming sleeve gastrectomy. I entered orders in epic. Answered her questions. She has been having some problems with feet swelling and just got back from a vacation with her 71-year-old and walked a lot. I advised her to try one of her Lasix tomorrow to see if she can get some of the fluid off.  Review of Systems: See HPI as well for other ROS.  Review of Systems  Cardiovascular: Positive for leg swelling.  Gastrointestinal: Positive for abdominal pain.  All other systems reviewed and are negative.   Medical History: Past Medical History:  Diagnosis Date   Hypertension   Patient Active Problem List  Diagnosis   Monoparesis of leg (CMS-HCC)   Abnormality of gait   Acute blood loss anemia   CAP (community acquired pneumonia)   Cough   Deep vein thrombosis (DVT) (CMS-HCC)   Dehydration   Depression   Essential hypertension   Hypertensive disorder   Fall   Fever   Flat foot   Generalized anxiety disorder   Generalized OA   Herniated nucleus pulposus, lumbar   Herpes simplex   Hip pain   Incomplete miscarriage   Miscarriage   Labile blood pressure   Neuropathic pain   Body mass index (BMI) 40.0-44.9, adult (CMS-HCC)   Obesity   Pain   PCOS (polycystic ovarian syndrome)   Postoperative pain   Pregnancy   Primary localized osteoarthritis of right hip   Radiculopathy, lumbar region   Lumbar spondylosis   Lumbosacral radiculopathy at L4   Right lower lobe pneumonia   Sinusitis, unspecified   Status  post surgery   Therapeutic opioid induced constipation   Urinary retention   Past Surgical History:  Procedure Laterality Date   3 level spinal infusion N/A   back surgery N/A   REPLACEMENT TOTAL KNEE BILATERAL Bilateral   right hip replacement Right    Allergies  Allergen Reactions   Gadolinium-Containing Contrast Media Nausea And Vomiting and Other (See Comments)  Pt describes this happens every time she gets gado even with slow injection   Codeine Itching, Nausea and Other (See Comments)  Hydrocodone does not affect pt   Duloxetine Other (See Comments)  Other reaction(s): insomia Altered mental status   Current Outpatient Medications on File Prior to Visit  Medication Sig Dispense Refill   buPROPion (WELLBUTRIN XL) 150 MG XL tablet Take by mouth   fluticasone propionate (FLONASE) 50 mcg/actuation nasal spray fluticasone propionate 50 mcg/actuation nasal spray,suspension SPRAY 1 SPRAY INTO EACH NOSTRIL EVERY DAY   lidocaine (LIDODERM) 5 % patch lidocaine 5 % topical patch   losartan-hydrochlorothiazide (HYZAAR) 100-12.5 mg tablet Take 1 tablet by mouth once daily   valACYclovir (VALTREX) 500 MG tablet valacyclovir 500 mg tablet TAKE 1 TABLET BY MOUTH EVERY DAY   amLODIPine (NORVASC) 2.5 MG tablet amlodipine 2.5 mg tablet TAKE 1 TABLET BY MOUTH EVERY DAY   baclofen (LIORESAL) 10 MG tablet Take by mouth every 6 (six) hours  oxyCODONE-acetaminophen (PERCOCET) 10-325 mg tablet Take by mouth every 6 (six) hours   pregabalin (LYRICA) 75 MG capsule pregabalin 75 mg capsule TAKE 1 CAPSULE BY MOUTH THREE TIMES A DAY   No current facility-administered medications on file prior to visit.   Family History  Problem Relation Age of Onset   High blood pressure (Hypertension) Father   Diabetes Father   Colon cancer Father   Myocardial Infarction (Heart attack) Father   Obesity Sister   High blood pressure (Hypertension) Sister   Myocardial Infarction (Heart attack) Sister    Diabetes Sister    Social History   Tobacco Use  Smoking Status Never Smoker  Smokeless Tobacco Never Used    Social History   Socioeconomic History   Marital status: Unknown  Tobacco Use   Smoking status: Never Smoker   Smokeless tobacco: Never Used  Substance and Sexual Activity   Alcohol use: Never   Drug use: Never   Objective:   Vitals:  04/03/21 1609  Pulse: 83  Temp: 36.8 C (98.3 F)  SpO2: 100%  Weight: (!) 139.7 kg (308 lb)  Height: 180.3 cm ('5\' 11"'$ )   Body mass index is 42.96 kg/m.  Physical Exam General: Tall African-American female who is obese. She is having some swelling in her feet HEENT: Unremarkable Chest: Clear to auscultation Heart: Sinus rhythm without murmurs Breast: Not examined Abdomen: Not tender GU not examined Rectal not performed Extremities some pitting edema in her legs and feet Neuro alert and oriented x3. Motor and sensory function grossly intact  Labs, Imaging and Diagnostic Testing: Normal single contrast upper GI  Assessment and Plan:  Diagnoses and all orders for this visit:  Morbid (severe) obesity due to excess calories (CMS-HCC)  Plan is robotic sleeve gastrectomy.      We answered her questions and plan to proceed with sleeve gastrectomy   Rodman Key B. Hassell Done, MD, FACS

## 2021-04-14 ENCOUNTER — Inpatient Hospital Stay (HOSPITAL_COMMUNITY): Payer: Medicare Other | Admitting: Certified Registered Nurse Anesthetist

## 2021-04-14 ENCOUNTER — Other Ambulatory Visit: Payer: Self-pay

## 2021-04-14 ENCOUNTER — Encounter (HOSPITAL_COMMUNITY): Payer: Self-pay | Admitting: Surgery

## 2021-04-14 ENCOUNTER — Encounter (HOSPITAL_COMMUNITY)
Admission: RE | Disposition: A | Discharge status: Home / Self Care | Payer: Self-pay | Source: Home / Self Care | Attending: Surgery

## 2021-04-14 ENCOUNTER — Inpatient Hospital Stay (HOSPITAL_COMMUNITY)
Admission: RE | Admit: 2021-04-14 | Discharge: 2021-04-16 | DRG: 621 | Disposition: A | Discharge status: Home / Self Care | Payer: Medicare Other | Attending: Surgery | Admitting: Surgery

## 2021-04-14 DIAGNOSIS — I10 Essential (primary) hypertension: Secondary | ICD-10-CM | POA: Diagnosis present

## 2021-04-14 DIAGNOSIS — Z8249 Family history of ischemic heart disease and other diseases of the circulatory system: Secondary | ICD-10-CM | POA: Diagnosis not present

## 2021-04-14 DIAGNOSIS — F411 Generalized anxiety disorder: Secondary | ICD-10-CM | POA: Diagnosis not present

## 2021-04-14 DIAGNOSIS — F32A Depression, unspecified: Secondary | ICD-10-CM | POA: Diagnosis not present

## 2021-04-14 DIAGNOSIS — Z833 Family history of diabetes mellitus: Secondary | ICD-10-CM

## 2021-04-14 DIAGNOSIS — D62 Acute posthemorrhagic anemia: Secondary | ICD-10-CM | POA: Diagnosis not present

## 2021-04-14 DIAGNOSIS — Z800 Family history of malignant neoplasm of digestive organs: Secondary | ICD-10-CM

## 2021-04-14 DIAGNOSIS — Z6841 Body Mass Index (BMI) 40.0 and over, adult: Secondary | ICD-10-CM

## 2021-04-14 DIAGNOSIS — Z86718 Personal history of other venous thrombosis and embolism: Secondary | ICD-10-CM | POA: Diagnosis not present

## 2021-04-14 DIAGNOSIS — Z96653 Presence of artificial knee joint, bilateral: Secondary | ICD-10-CM | POA: Diagnosis not present

## 2021-04-14 DIAGNOSIS — Z903 Acquired absence of stomach [part of]: Secondary | ICD-10-CM

## 2021-04-14 DIAGNOSIS — Z96641 Presence of right artificial hip joint: Secondary | ICD-10-CM | POA: Diagnosis not present

## 2021-04-14 HISTORY — PX: UPPER GI ENDOSCOPY: SHX6162

## 2021-04-14 LAB — CBC
HCT: 37 % (ref 36.0–46.0)
Hemoglobin: 11.3 g/dL — ABNORMAL LOW (ref 12.0–15.0)
MCH: 24.4 pg — ABNORMAL LOW (ref 26.0–34.0)
MCHC: 30.5 g/dL (ref 30.0–36.0)
MCV: 79.9 fL — ABNORMAL LOW (ref 80.0–100.0)
Platelets: 208 10*3/uL (ref 150–400)
RBC: 4.63 MIL/uL (ref 3.87–5.11)
RDW: 16.6 % — ABNORMAL HIGH (ref 11.5–15.5)
WBC: 8.4 10*3/uL (ref 4.0–10.5)
nRBC: 0 % (ref 0.0–0.2)

## 2021-04-14 LAB — PREGNANCY, URINE: Preg Test, Ur: NEGATIVE

## 2021-04-14 LAB — TYPE AND SCREEN
ABO/RH(D): B POS
Antibody Screen: NEGATIVE

## 2021-04-14 LAB — CREATININE, SERUM
Creatinine, Ser: 1 mg/dL (ref 0.44–1.00)
GFR, Estimated: >60 mL/min (ref >60)

## 2021-04-14 LAB — HEMOGLOBIN AND HEMATOCRIT, BLOOD
HCT: 41.1 % (ref 36.0–46.0)
Hemoglobin: 12.6 g/dL (ref 12.0–15.0)

## 2021-04-14 SURGERY — XI ROBOTIC GASTRIC SLEEVE RESECTION
Anesthesia: General | Site: Esophagus

## 2021-04-14 MED ORDER — EPHEDRINE 5 MG/ML INJ
INTRAVENOUS | Status: AC
  Filled 2021-04-14: qty 5

## 2021-04-14 MED ORDER — CHLORHEXIDINE GLUCONATE CLOTH 2 % EX PADS: 6.0000 | MEDICATED_PAD | Freq: Once | CUTANEOUS | Status: DC

## 2021-04-14 MED ORDER — FENTANYL CITRATE (PF) 250 MCG/5ML IJ SOLN
INTRAMUSCULAR | Status: AC
  Filled 2021-04-14: qty 5

## 2021-04-14 MED ORDER — ACETAMINOPHEN 160 MG/5ML PO SOLN: 1000.0000 mg | Freq: Three times a day (TID) | ORAL | Status: DC

## 2021-04-14 MED ORDER — KCL IN DEXTROSE-NACL 20-5-0.45 MEQ/L-%-% IV SOLN
INTRAVENOUS | Status: DC
  Filled 2021-04-14 (×4): qty 1000

## 2021-04-14 MED ORDER — OXYCODONE HCL 5 MG/5ML PO SOLN
5.0000 mg | Freq: Four times a day (QID) | ORAL | Status: DC | PRN
  Administered 2021-04-14 – 2021-04-16 (×2): 5 mg via ORAL
  Filled 2021-04-14 (×3): qty 5

## 2021-04-14 MED ORDER — FLUTICASONE PROPIONATE 50 MCG/ACT NA SUSP: 1.0000 | Freq: Every day | NASAL | Status: DC | PRN

## 2021-04-14 MED ORDER — PANTOPRAZOLE SODIUM 40 MG IV SOLR
40.0000 mg | Freq: Every day | INTRAVENOUS | Status: DC
  Administered 2021-04-14 – 2021-04-15 (×2): 40 mg via INTRAVENOUS
  Filled 2021-04-14 (×2): qty 40

## 2021-04-14 MED ORDER — ACETAMINOPHEN 500 MG PO TABS
1000.0000 mg | ORAL_TABLET | ORAL | Status: AC
  Administered 2021-04-14: 1000 mg via ORAL
  Filled 2021-04-14: qty 2

## 2021-04-14 MED ORDER — GABAPENTIN 300 MG PO CAPS
300.0000 mg | ORAL_CAPSULE | ORAL | Status: AC
  Administered 2021-04-14: 300 mg via ORAL
  Filled 2021-04-14: qty 1

## 2021-04-14 MED ORDER — HYDRALAZINE HCL 20 MG/ML IJ SOLN
INTRAMUSCULAR | Status: AC
  Filled 2021-04-14: qty 1

## 2021-04-14 MED ORDER — HYDROMORPHONE HCL 1 MG/ML IJ SOLN
INTRAMUSCULAR | Status: AC
  Filled 2021-04-14: qty 1

## 2021-04-14 MED ORDER — MIDAZOLAM HCL 5 MG/5ML IJ SOLN
INTRAMUSCULAR | Status: DC | PRN
  Administered 2021-04-14: 2 mg via INTRAVENOUS

## 2021-04-14 MED ORDER — ENOXAPARIN (LOVENOX) PATIENT EDUCATION KIT
PACK | Freq: Once | Status: AC
  Filled 2021-04-14: qty 1

## 2021-04-14 MED ORDER — LACTATED RINGERS IR SOLN
Status: DC | PRN
  Administered 2021-04-14: 1000 mL

## 2021-04-14 MED ORDER — HYDRALAZINE HCL 20 MG/ML IJ SOLN
10.0000 mg | Freq: Once | INTRAMUSCULAR | Status: AC
  Administered 2021-04-14: 10 mg via INTRAVENOUS

## 2021-04-14 MED ORDER — ONDANSETRON HCL 4 MG/2ML IJ SOLN
INTRAMUSCULAR | Status: DC | PRN
  Administered 2021-04-14: 4 mg via INTRAVENOUS

## 2021-04-14 MED ORDER — CHLORHEXIDINE GLUCONATE 0.12 % MT SOLN
15.0000 mL | Freq: Once | OROMUCOSAL | Status: AC
  Administered 2021-04-14: 15 mL via OROMUCOSAL

## 2021-04-14 MED ORDER — HEPARIN SODIUM (PORCINE) 5000 UNIT/ML IJ SOLN
5000.0000 [IU] | Freq: Three times a day (TID) | INTRAMUSCULAR | Status: DC
  Administered 2021-04-14 – 2021-04-16 (×6): 5000 [IU] via SUBCUTANEOUS
  Filled 2021-04-14 (×6): qty 1

## 2021-04-14 MED ORDER — PHENYLEPHRINE 40 MCG/ML (10ML) SYRINGE FOR IV PUSH (FOR BLOOD PRESSURE SUPPORT)
PREFILLED_SYRINGE | INTRAVENOUS | Status: DC | PRN
Start: 2021-04-14 — End: 2021-04-14
  Administered 2021-04-14 (×3): 80 ug via INTRAVENOUS

## 2021-04-14 MED ORDER — AMLODIPINE BESYLATE 5 MG PO TABS
2.5000 mg | ORAL_TABLET | Freq: Every day | ORAL | Status: DC
  Administered 2021-04-15 – 2021-04-16 (×2): 2.5 mg via ORAL
  Filled 2021-04-14 (×2): qty 1

## 2021-04-14 MED ORDER — MIDAZOLAM HCL 2 MG/2ML IJ SOLN
INTRAMUSCULAR | Status: AC
  Filled 2021-04-14: qty 2

## 2021-04-14 MED ORDER — HYDROMORPHONE HCL 1 MG/ML IJ SOLN: 0.2500 mg | INTRAMUSCULAR | Status: DC | PRN

## 2021-04-14 MED ORDER — ENSURE MAX PROTEIN PO LIQD
2.0000 [oz_av] | ORAL | Status: DC
  Administered 2021-04-15 – 2021-04-16 (×6): 2 [oz_av] via ORAL

## 2021-04-14 MED ORDER — CHLORHEXIDINE GLUCONATE CLOTH 2 % EX PADS
6.0000 | MEDICATED_PAD | Freq: Once | CUTANEOUS | Status: DC
  Administered 2021-04-14: 6 via TOPICAL

## 2021-04-14 MED ORDER — KETOROLAC TROMETHAMINE 30 MG/ML IJ SOLN
30.0000 mg | Freq: Once | INTRAMUSCULAR | Status: AC | PRN
  Administered 2021-04-14: 30 mg via INTRAVENOUS

## 2021-04-14 MED ORDER — ACETAMINOPHEN 500 MG PO TABS
1000.0000 mg | ORAL_TABLET | Freq: Three times a day (TID) | ORAL | Status: DC
  Administered 2021-04-14 – 2021-04-16 (×5): 1000 mg via ORAL
  Filled 2021-04-14 (×6): qty 2

## 2021-04-14 MED ORDER — LIDOCAINE HCL 2 % IJ SOLN
INTRAMUSCULAR | Status: AC
  Filled 2021-04-14: qty 20

## 2021-04-14 MED ORDER — MORPHINE SULFATE (PF) 2 MG/ML IV SOLN
1.0000 mg | INTRAVENOUS | Status: DC | PRN
  Administered 2021-04-14 (×3): 2 mg via INTRAVENOUS
  Filled 2021-04-14 (×3): qty 1

## 2021-04-14 MED ORDER — KETAMINE HCL 10 MG/ML IJ SOLN
INTRAMUSCULAR | Status: DC | PRN
  Administered 2021-04-14: 30 mg via INTRAVENOUS

## 2021-04-14 MED ORDER — HEPARIN SODIUM (PORCINE) 5000 UNIT/ML IJ SOLN
5000.0000 [IU] | INTRAMUSCULAR | Status: AC
  Administered 2021-04-14: 5000 [IU] via SUBCUTANEOUS
  Filled 2021-04-14: qty 1

## 2021-04-14 MED ORDER — 0.9 % SODIUM CHLORIDE (POUR BTL) OPTIME
TOPICAL | Status: DC | PRN
  Administered 2021-04-14: 1000 mL

## 2021-04-14 MED ORDER — PROPOFOL 10 MG/ML IV BOLUS
INTRAVENOUS | Status: AC
  Filled 2021-04-14: qty 20

## 2021-04-14 MED ORDER — ROCURONIUM BROMIDE 10 MG/ML (PF) SYRINGE
PREFILLED_SYRINGE | INTRAVENOUS | Status: DC | PRN
Start: 2021-04-14 — End: 2021-04-14
  Administered 2021-04-14: 70 mg via INTRAVENOUS
  Administered 2021-04-14: 30 mg via INTRAVENOUS

## 2021-04-14 MED ORDER — ORAL CARE MOUTH RINSE: 15.0000 mL | Freq: Once | OROMUCOSAL | Status: AC

## 2021-04-14 MED ORDER — KETOROLAC TROMETHAMINE 30 MG/ML IJ SOLN
INTRAMUSCULAR | Status: AC
  Filled 2021-04-14: qty 1

## 2021-04-14 MED ORDER — PROPOFOL 10 MG/ML IV BOLUS
INTRAVENOUS | Status: DC | PRN
  Administered 2021-04-14: 200 mg via INTRAVENOUS

## 2021-04-14 MED ORDER — PROMETHAZINE HCL 25 MG/ML IJ SOLN: 6.2500 mg | INTRAMUSCULAR | Status: DC | PRN

## 2021-04-14 MED ORDER — METOPROLOL TARTRATE 5 MG/5ML IV SOLN: 5.0000 mg | Freq: Four times a day (QID) | INTRAVENOUS | Status: DC | PRN

## 2021-04-14 MED ORDER — ONDANSETRON HCL 4 MG/2ML IJ SOLN
4.0000 mg | INTRAMUSCULAR | Status: DC | PRN
  Administered 2021-04-15 – 2021-04-16 (×2): 4 mg via INTRAVENOUS
  Filled 2021-04-14 (×2): qty 2

## 2021-04-14 MED ORDER — DEXAMETHASONE SODIUM PHOSPHATE 10 MG/ML IJ SOLN
INTRAMUSCULAR | Status: DC | PRN
  Administered 2021-04-14: 5 mg via INTRAVENOUS

## 2021-04-14 MED ORDER — KETAMINE HCL 10 MG/ML IJ SOLN
INTRAMUSCULAR | Status: AC
  Filled 2021-04-14: qty 1

## 2021-04-14 MED ORDER — SODIUM CHLORIDE (PF) 0.9 % IJ SOLN
INTRAMUSCULAR | Status: AC
  Filled 2021-04-14: qty 10

## 2021-04-14 MED ORDER — EPHEDRINE SULFATE-NACL 50-0.9 MG/10ML-% IV SOSY
PREFILLED_SYRINGE | INTRAVENOUS | Status: DC | PRN
Start: 2021-04-14 — End: 2021-04-14
  Administered 2021-04-14: 5 mg via INTRAVENOUS
  Administered 2021-04-14: 10 mg via INTRAVENOUS

## 2021-04-14 MED ORDER — LIDOCAINE 2% (20 MG/ML) 5 ML SYRINGE
INTRAMUSCULAR | Status: DC | PRN
  Administered 2021-04-14: 100 mg via INTRAVENOUS

## 2021-04-14 MED ORDER — ONDANSETRON HCL 4 MG/2ML IJ SOLN
INTRAMUSCULAR | Status: AC
  Filled 2021-04-14: qty 2

## 2021-04-14 MED ORDER — LACTATED RINGERS IV SOLN: INTRAVENOUS | Status: DC

## 2021-04-14 MED ORDER — SUGAMMADEX SODIUM 200 MG/2ML IV SOLN
INTRAVENOUS | Status: DC | PRN
  Administered 2021-04-14: 300 mg via INTRAVENOUS

## 2021-04-14 MED ORDER — APREPITANT 40 MG PO CAPS
40.0000 mg | ORAL_CAPSULE | ORAL | Status: AC
  Administered 2021-04-14: 40 mg via ORAL
  Filled 2021-04-14: qty 1

## 2021-04-14 MED ORDER — LIDOCAINE 2% (20 MG/ML) 5 ML SYRINGE
INTRAMUSCULAR | Status: DC | PRN
  Administered 2021-04-14: 1.5 mg/kg/h via INTRAVENOUS

## 2021-04-14 MED ORDER — DEXMEDETOMIDINE (PRECEDEX) IN NS 20 MCG/5ML (4 MCG/ML) IV SYRINGE
PREFILLED_SYRINGE | INTRAVENOUS | Status: AC
  Filled 2021-04-14: qty 5

## 2021-04-14 MED ORDER — SCOPOLAMINE 1 MG/3DAYS TD PT72
1.0000 | MEDICATED_PATCH | TRANSDERMAL | Status: DC
  Administered 2021-04-14: 1.5 mg via TRANSDERMAL
  Filled 2021-04-14: qty 1

## 2021-04-14 MED ORDER — ALBUTEROL SULFATE (2.5 MG/3ML) 0.083% IN NEBU: 2.5000 mg | INHALATION_SOLUTION | RESPIRATORY_TRACT | Status: DC | PRN

## 2021-04-14 MED ORDER — DEXMEDETOMIDINE HCL IN NACL 200 MCG/50ML IV SOLN
INTRAVENOUS | Status: DC | PRN
  Administered 2021-04-14: 8 ug via INTRAVENOUS

## 2021-04-14 MED ORDER — FENTANYL CITRATE (PF) 100 MCG/2ML IJ SOLN
INTRAMUSCULAR | Status: DC | PRN
  Administered 2021-04-14: 50 ug via INTRAVENOUS
  Administered 2021-04-14: 100 ug via INTRAVENOUS
  Administered 2021-04-14: 50 ug via INTRAVENOUS

## 2021-04-14 SURGICAL SUPPLY — 74 items
APPLIER CLIP 5 13 M/L LIGAMAX5 (MISCELLANEOUS)
APPLIER CLIP ROT 10 11.4 M/L (STAPLE)
BAG COUNTER SPONGE SURGICOUNT (BAG) ×3 IMPLANT
BLADE SURG SZ11 CARB STEEL (BLADE) ×3 IMPLANT
CANNULA REDUC XI 12-8 STAPL (CANNULA) ×3
CANNULA REDUCER 12-8 DVNC XI (CANNULA) ×2 IMPLANT
CHLORAPREP W/TINT 26 (MISCELLANEOUS) ×6 IMPLANT
CLIP APPLIE 5 13 M/L LIGAMAX5 (MISCELLANEOUS) IMPLANT
CLIP APPLIE ROT 10 11.4 M/L (STAPLE) IMPLANT
COVER MAYO STAND STRL (DRAPES) ×3 IMPLANT
COVER SURGICAL LIGHT HANDLE (MISCELLANEOUS) ×3 IMPLANT
COVER TIP SHEARS 8 DVNC (MISCELLANEOUS) IMPLANT
COVER TIP SHEARS 8MM DA VINCI (MISCELLANEOUS)
DECANTER SPIKE VIAL GLASS SM (MISCELLANEOUS) ×3 IMPLANT
DERMABOND ADVANCED (GAUZE/BANDAGES/DRESSINGS) ×1
DERMABOND ADVANCED .7 DNX12 (GAUZE/BANDAGES/DRESSINGS) ×2 IMPLANT
DRAPE 3/4 80X56 (DRAPES) ×3 IMPLANT
DRAPE ARM DVNC X/XI (DISPOSABLE) ×6 IMPLANT
DRAPE COLUMN DVNC XI (DISPOSABLE) IMPLANT
DRAPE DA VINCI XI ARM (DISPOSABLE) ×9
DRAPE DA VINCI XI COLUMN (DISPOSABLE)
DRSG TEGADERM 2-3/8X2-3/4 SM (GAUZE/BANDAGES/DRESSINGS) ×15 IMPLANT
ELECT REM PT RETURN 15FT ADLT (MISCELLANEOUS) ×3 IMPLANT
ENDOLOOP SUT PDS II  0 18 (SUTURE)
ENDOLOOP SUT PDS II 0 18 (SUTURE) IMPLANT
GAUZE 4X4 16PLY ~~LOC~~+RFID DBL (SPONGE) ×3 IMPLANT
GAUZE SPONGE 2X2 8PLY STRL LF (GAUZE/BANDAGES/DRESSINGS) ×2 IMPLANT
GLOVE SRG 8 PF TXTR STRL LF DI (GLOVE) ×2 IMPLANT
GLOVE SURG POLY ORTHO LF SZ7.5 (GLOVE) ×6 IMPLANT
GLOVE SURG UNDER POLY LF SZ8 (GLOVE) ×3
GOWN STRL REUS W/TWL LRG LVL3 (GOWN DISPOSABLE) ×9 IMPLANT
GOWN STRL REUS W/TWL XL LVL3 (GOWN DISPOSABLE) ×6 IMPLANT
GRASPER SUT TROCAR 14GX15 (MISCELLANEOUS) ×3 IMPLANT
KIT BASIN OR (CUSTOM PROCEDURE TRAY) ×3 IMPLANT
MARKER SKIN DUAL TIP RULER LAB (MISCELLANEOUS) ×3 IMPLANT
MAT PREVALON FULL STRYKER (MISCELLANEOUS) ×3 IMPLANT
NEEDLE INSUFFLATION 14GA 120MM (NEEDLE) IMPLANT
NEEDLE SPNL 22GX3.5 QUINCKE BK (NEEDLE) ×3 IMPLANT
PACK CARDIOVASCULAR III (CUSTOM PROCEDURE TRAY) ×3 IMPLANT
RELOAD STAPLER 2.5X60 WHT DVNC (STAPLE) ×8 IMPLANT
RELOAD STAPLER 3.5X60 BLU DVNC (STAPLE) ×2 IMPLANT
RELOAD STAPLER 4.3X60 GRN DVNC (STAPLE) ×2 IMPLANT
SCISSORS LAP 5X35 DISP (ENDOMECHANICALS) IMPLANT
SEAL CANN UNIV 5-8 DVNC XI (MISCELLANEOUS) ×4 IMPLANT
SEAL XI 5MM-8MM UNIVERSAL (MISCELLANEOUS) ×6
SEALER VESSEL DA VINCI XI (MISCELLANEOUS) ×3
SEALER VESSEL EXT DVNC XI (MISCELLANEOUS) ×2 IMPLANT
SET IRRIG TUBING LAPAROSCOPIC (IRRIGATION / IRRIGATOR) ×3 IMPLANT
SLEEVE GASTRECTOMY 40FR VISIGI (MISCELLANEOUS) ×3 IMPLANT
SOL ANTI FOG 6CC (MISCELLANEOUS) ×2 IMPLANT
SOLUTION ANTI FOG 6CC (MISCELLANEOUS) ×1
SOLUTION ELECTROLUBE (MISCELLANEOUS) ×3 IMPLANT
SPONGE GAUZE 2X2 STER 10/PKG (GAUZE/BANDAGES/DRESSINGS) ×1
STAPLER 60 DA VINCI SURE FORM (STAPLE) ×3
STAPLER 60 SUREFORM DVNC (STAPLE) ×2 IMPLANT
STAPLER CANNULA SEAL DVNC XI (STAPLE) ×2 IMPLANT
STAPLER CANNULA SEAL XI (STAPLE) ×3
STAPLER RELOAD 2.5X60 WHITE (STAPLE) ×12
STAPLER RELOAD 2.5X60 WHT DVNC (STAPLE) ×8
STAPLER RELOAD 3.5X60 BLU DVNC (STAPLE) ×2
STAPLER RELOAD 3.5X60 BLUE (STAPLE) ×3
STAPLER RELOAD 4.3X60 GREEN (STAPLE) ×3
STAPLER RELOAD 4.3X60 GRN DVNC (STAPLE) ×2
SUT MNCRL AB 4-0 PS2 18 (SUTURE) ×3 IMPLANT
SUT VICRYL 0 TIES 12 18 (SUTURE) ×3 IMPLANT
SUT VLOC 180 2-0 9IN GS21 (SUTURE) IMPLANT
SYR 20ML LL LF (SYRINGE) ×3 IMPLANT
TAPE STRIPS DRAPE STRL (GAUZE/BANDAGES/DRESSINGS) ×3 IMPLANT
TOWEL OR 17X26 10 PK STRL BLUE (TOWEL DISPOSABLE) ×3 IMPLANT
TOWEL OR NON WOVEN STRL DISP B (DISPOSABLE) ×3 IMPLANT
TRAY FOLEY MTR SLVR 16FR STAT (SET/KITS/TRAYS/PACK) IMPLANT
TROCAR ADV FIXATION 5X100MM (TROCAR) IMPLANT
TROCAR BLADELESS OPT 5 100 (ENDOMECHANICALS) ×3 IMPLANT
TUBING INSUFFLATION 10FT LAP (TUBING) ×3 IMPLANT

## 2021-04-14 NOTE — Anesthesia Preprocedure Evaluation (Signed)
Anesthesia Evaluation  Patient identified by MRN, date of birth, ID band Patient awake    Reviewed: Allergy & Precautions, NPO status , Patient's Chart, lab work & pertinent test results  History of Anesthesia Complications (+) PONV  Airway Mallampati: II  TM Distance: >3 FB Neck ROM: Full    Dental no notable dental hx.    Pulmonary neg pulmonary ROS,    Pulmonary exam normal breath sounds clear to auscultation       Cardiovascular hypertension, Normal cardiovascular exam Rhythm:Regular Rate:Normal     Neuro/Psych negative neurological ROS  negative psych ROS   GI/Hepatic negative GI ROS, Neg liver ROS,   Endo/Other  negative endocrine ROS  Renal/GU negative Renal ROS  negative genitourinary   Musculoskeletal negative musculoskeletal ROS (+)   Abdominal   Peds negative pediatric ROS (+)  Hematology negative hematology ROS (+)   Anesthesia Other Findings   Reproductive/Obstetrics negative OB ROS                             Anesthesia Physical Anesthesia Plan  ASA: 2  Anesthesia Plan: General   Post-op Pain Management:    Induction: Intravenous  PONV Risk Score and Plan: 4 or greater and Ondansetron, Dexamethasone, Midazolam, Treatment may vary due to age or medical condition and Scopolamine patch - Pre-op  Airway Management Planned: Oral ETT  Additional Equipment:   Intra-op Plan:   Post-operative Plan: Extubation in OR  Informed Consent: I have reviewed the patients History and Physical, chart, labs and discussed the procedure including the risks, benefits and alternatives for the proposed anesthesia with the patient or authorized representative who has indicated his/her understanding and acceptance.     Dental advisory given  Plan Discussed with: CRNA and Surgeon  Anesthesia Plan Comments:         Anesthesia Quick Evaluation

## 2021-04-14 NOTE — Progress Notes (Signed)
PHARMACY CONSULT FOR:  Risk Assessment for Post-Discharge VTE Following Bariatric Surgery  Post-Discharge VTE Risk Assessment: This patient's probability of 30-day post-discharge VTE is increased due to the factors marked:   Female    Age >/=60 years    BMI >/=50 kg/m2    CHF    Dyspnea at Rest    Paraplegia  X  Non-gastric-band surgery    Operation Time >/=3 hr    Return to OR     Length of Stay >/= 3 d   Hx of VTE  X Hypercoagulable condition   Significant venous stasis       Predicted probability of 30-day post-discharge VTE: pt is at high risk due to hx DVT   Recommendation for Discharge: Enoxaparin 40 mg [place] q12h x 4 weeks post-discharge    Ashley [name] is a 45 y.o. female who underwent  gastric sleeve resection on 04/14/21.   Case start: 0809 Case end: 0947   Allergies  Allergen Reactions   Gadolinium Derivatives Nausea And Vomiting and Other (See Comments)    Pt describes this happens every time she gets gado even with slow injection   Duloxetine     Other reaction(s): insomia   Codeine Itching, Nausea Only and Other (See Comments)    Hydrocodone does not affect pt   Cymbalta [Duloxetine Hcl] Other (See Comments)    Altered mental status    Patient Measurements: Height: 6' (182.9 cm) Weight: 135.6 kg (299 lb) IBW/kg (Calculated) : 73.1 Body mass index is 40.55 kg/m.  No results for input(s): WBC, HGB, HCT, PLT, APTT, CREATININE, LABCREA, CREATININE, CREAT24HRUR, MG, PHOS, ALBUMIN, PROT, ALBUMIN, AST, ALT, ALKPHOS, BILITOT, BILIDIR, IBILI in the last 72 hours. Estimated Creatinine Clearance: 134.4 mL/min (by C-G formula based on SCr of 0.81 mg/dL).    Past Medical History:  Diagnosis Date   Arthritis    knees, back   Carpal tunnel syndrome of right wrist [id number]   Complication of anesthesia    Depression    History of pneumonia    Hypertension    Lumbar spondylosis    PCOS (polycystic ovarian syndrome)    Pneumonia    2016   PONV  (postoperative nausea and vomiting)      Medications Prior to Admission  Medication Sig Dispense Refill Last Dose   amLODipine (NORVASC) 2.5 MG tablet Take 2.5 mg by mouth daily.   04/14/2021 at 0430   baclofen (LIORESAL) 10 MG tablet Take 3 tablets (30 mg total) by mouth 3 (three) times daily. (Patient taking differently: Take 10 mg by mouth 3 (three) times daily.) 270 each 1    buPROPion (WELLBUTRIN XL) 150 MG 24 hr tablet Take 450 mg by mouth daily.   04/14/2021 at 0430   cetirizine (ZYRTEC) 10 MG tablet Take 10 mg by mouth daily as needed for allergies.    04/14/2021 at 0430   fluticasone (FLONASE) 50 MCG/ACT nasal spray Place 1 spray into both nostrils daily as needed for allergies.   Past Week   furosemide (LASIX) 20 MG tablet Take 20 mg by mouth daily as needed for edema.   04/13/2021   glycopyrrolate (ROBINUL) 1 MG tablet Take 1 mg by mouth daily.   04/13/2021   levocetirizine (XYZAL) 5 MG tablet Take 5 mg by mouth every evening.   04/13/2021   lidocaine (LIDODERM) 5 % Place 1 patch onto the skin 2 (two) times daily.   04/13/2021   losartan-hydrochlorothiazide (HYZAAR) 100-12.5 MG tablet Take 1 tablet by mouth  daily.   04/13/2021   Multiple Vitamins-Minerals (MULTIVITAMIN WITH MINERALS) tablet Take 1 tablet daily by mouth.      oxyCODONE-acetaminophen (PERCOCET) 10-325 MG tablet Take 1 tablet by mouth every 4 (four) hours as needed. (Patient taking differently: Take 1 tablet by mouth every 6 (six) hours as needed for pain.) 60 tablet 0    pregabalin (LYRICA) 75 MG capsule Take 1 capsule (75 mg total) by mouth 3 (three) times daily. 90 capsule 1 04/14/2021 at 0415   valACYclovir (VALTREX) 500 MG tablet Take 500 mg by mouth daily as needed.   Past Week   VYVANSE 50 MG capsule Take 50 mg by mouth every morning.   04/13/2021   Albuterol Sulfate (PROAIR RESPICLICK) 123XX123 (90 Base) MCG/ACT AEPB Inhale 1 puff into the lungs every 4 (four) hours as needed for cough or wheezing.   More than a month    EPINEPHrine 0.3 mg/0.3 mL IJ SOAJ injection Inject 3 mLs into the muscle once.  0 More than a month   gentamicin cream (GARAMYCIN) 0.1 % Apply 1 application topically 2 (two) times daily. (Patient not taking: Reported on 03/31/2021) 30 g 1 Not Taking       Lynelle Doctor 04/14/2021,10:34 AM

## 2021-04-14 NOTE — Discharge Instructions (Signed)

## 2021-04-14 NOTE — Progress Notes (Signed)

## 2021-04-14 NOTE — Interval H&P Note (Signed)
History and Physical Interval Note:  04/14/2021 7:15 AM  Ashley [name]  has presented today for surgery, with the diagnosis of morbid obesity.  The various methods of treatment have been discussed with the patient and family. After consideration of risks, benefits and other options for treatment, the patient has consented to  Procedure(s): XI ROBOTIC GASTRIC SLEEVE RESECTION (N/A) UPPER GI ENDOSCOPY (N/A) as a surgical intervention.  The patient's history has been reviewed, patient examined, no change in status, stable for surgery.  I have reviewed the patient's chart and labs.  Questions were answered to the patient's satisfaction.     Pedro Earls

## 2021-04-14 NOTE — Anesthesia Postprocedure Evaluation (Signed)
Anesthesia Post Note  Patient: Ashley [name]  Procedure(s) Performed: XI ROBOTIC GASTRIC SLEEVE RESECTION UPPER GI ENDOSCOPY (Esophagus)     Patient location during evaluation: PACU Anesthesia Type: General Level of consciousness: awake and alert Pain management: pain level controlled Vital Signs Assessment: post-procedure vital signs reviewed and stable Respiratory status: spontaneous breathing, nonlabored ventilation, respiratory function stable and patient connected to nasal cannula oxygen Cardiovascular status: blood pressure returned to baseline and stable Postop Assessment: no apparent nausea or vomiting Anesthetic complications: no   No notable events documented.  Last Vitals:  Vitals:   04/14/21 1337 04/14/21 1427  BP: (!) 162/93 (!) 178/100  Pulse: 97 98  Resp: 18 18  Temp: 36.7 C (!) 36.4 C  SpO2: 100% 100%    Last Pain:  Vitals:   04/14/21 1440  TempSrc:   PainSc: 9                  Duquan Gillooly S

## 2021-04-14 NOTE — Anesthesia Procedure Notes (Signed)
Procedure Name: Intubation Date/Time: 04/14/2021 8:03 AM Performed by: Gean Maidens, CRNA Pre-anesthesia Checklist: Patient identified, Emergency Drugs available, Suction available, Patient being monitored and Timeout performed Patient Re-evaluated:Patient Re-evaluated prior to induction Oxygen Delivery Method: Circle system utilized Preoxygenation: Pre-oxygenation with 100% oxygen Induction Type: IV induction Ventilation: Mask ventilation without difficulty Laryngoscope Size: Mac and 4 Grade View: Grade I Tube type: Oral Tube size: 7.0 mm Number of attempts: 1 Airway Equipment and Method: Stylet Placement Confirmation: ETT inserted through vocal cords under direct vision, positive ETCO2 and breath sounds checked- equal and bilateral Secured at: 21 cm Tube secured with: Tape Dental Injury: Teeth and Oropharynx as per pre-operative assessment

## 2021-04-14 NOTE — Progress Notes (Signed)
Patient has started water. She c/o 8/10 abdominal pain. IV prn given. She has belched numerous times with relief from that. Voiding and walking in room. Patient asks to ambulate in hallway at a later time. IS done.

## 2021-04-14 NOTE — Op Note (Signed)
14 April 2021  Surgeon: Kaylyn Lim, MD, FACS  Asst:  Gurney Maxin, MD, FACS  Anes:  General endotracheal  Procedure: XI robotic sleeve gastrectomy and upper endoscopy  Diagnosis: Morbid obesity  Complications:  None noted  EBL:   minmal cc  Description of Procedure:  The patient was take to OR 2 and given general anesthesia.  The abdomen was prepped with Technicare and draped sterilely.  A timeout was performed.  Access to the abdomen was achieved with a 5 mm Optiview through the right upper quadrant.  Four robotic trocars were then inserted with a 12 to the right of the midline, and three 8 mm trocars.  The Sherrie Sport was inserted and mounted on the left.  The robot was docked from the left.  .  Following insufflation, the state of the abdomen was found to be free of adhesions.  The ViSiGi 36Fr tube was inserted to deflate the stomach and was pulled back into the esophagus.    The pylorus was identified and we measured 6 cm back and marked the antrum.  At that point we began dissection to take down the greater curvature of the stomach using the vessel sealer  This dissection was taken all the way up to the left crus.  Posterior attachments of the stomach were also taken down.    The  36 ViSiGi tube was then passed into the antrum and suction applied so that it was snug along the lessor curvature.  The "crow's foot" or incisura was identified.  The sleeve gastrectomy was begun using the Surefire blue platform stapler beginning with a blue stapler followed by white staples until completion.   When the sleeve was complete the tube was taken off suction and insufflated briefly.  The tube was withdrawn.  Upper endoscopy was then performed by Dr. Hassell Done.  Tubular sleeve was seen.  No bubbles or bleeding noted.   The specimen was extracted through the 15 trocar site.  Local was provided by infiltrating with Exparel as a TAP block and closed 4-0 Monocryl and Dermabond.    Matt B. Hassell Done, [place],  [facility], [place]

## 2021-04-14 NOTE — Transfer of Care (Signed)
Immediate Anesthesia Transfer of Care Note  Patient: Ashley [name]  Procedure(s) Performed: XI ROBOTIC GASTRIC SLEEVE RESECTION UPPER GI ENDOSCOPY (Esophagus)  Patient Location: PACU  Anesthesia Type:General  Level of Consciousness: sedated, patient cooperative and responds to stimulation  Airway & Oxygen Therapy: Patient Spontanous Breathing and Patient connected to face mask oxygen   Post-op Assessment: Report given to RN and Post -op Vital signs reviewed and stable  Post vital signs: Reviewed and stable  Last Vitals:  Vitals Value Taken Time  BP    Temp    Pulse 80 04/14/21 0959  Resp 16 04/14/21 1000  SpO2 100 % 04/14/21 0959  Vitals shown include unvalidated device data.  Last Pain:  Vitals:   04/14/21 0540  TempSrc: Oral         Complications: No notable events documented.

## 2021-04-15 ENCOUNTER — Encounter (HOSPITAL_COMMUNITY): Payer: Self-pay | Admitting: Surgery

## 2021-04-15 LAB — CBC WITH DIFFERENTIAL/PLATELET
Abs Immature Granulocytes: 0.02 10*3/uL (ref 0.00–0.07)
Basophils Absolute: 0 10*3/uL (ref 0.0–0.1)
Basophils Relative: 0 %
Eosinophils Absolute: 0 10*3/uL (ref 0.0–0.5)
Eosinophils Relative: 0 %
HCT: 37.6 % (ref 36.0–46.0)
Hemoglobin: 11.7 g/dL — ABNORMAL LOW (ref 12.0–15.0)
Immature Granulocytes: 0 %
Lymphocytes Relative: 20 %
Lymphs Abs: 1.4 10*3/uL (ref 0.7–4.0)
MCH: 24.8 pg — ABNORMAL LOW (ref 26.0–34.0)
MCHC: 31.1 g/dL (ref 30.0–36.0)
MCV: 79.7 fL — ABNORMAL LOW (ref 80.0–100.0)
Monocytes Absolute: 0.7 10*3/uL (ref 0.1–1.0)
Monocytes Relative: 9 %
Neutro Abs: 4.9 10*3/uL (ref 1.7–7.7)
Neutrophils Relative %: 71 %
Platelets: 211 10*3/uL (ref 150–400)
RBC: 4.72 MIL/uL (ref 3.87–5.11)
RDW: 16.4 % — ABNORMAL HIGH (ref 11.5–15.5)
WBC: 7 10*3/uL (ref 4.0–10.5)
nRBC: 0 % (ref 0.0–0.2)

## 2021-04-15 MED ORDER — SIMETHICONE 80 MG PO CHEW
80.0000 mg | CHEWABLE_TABLET | Freq: Four times a day (QID) | ORAL | Status: DC | PRN
  Filled 2021-04-15: qty 1

## 2021-04-15 NOTE — Progress Notes (Signed)
Patient alert and oriented, pain is controlled. Patient is tolerating fluids, advanced to protein shake today, patient is tolerating well.  Reviewed Gastric sleeve discharge instructions with patient and patient is able to articulate understanding.  Provided information on BELT program, Support Group and [facility] outpatient pharmacy.  Discussed Lovenox injections with patient after discharge due to history of DVT.  Pt agreed to injections after discharge.  Shared Lovenox education kit with patient.  Pt watched Lovenox self-injection video. All questions answered, will continue to monitor.

## 2021-04-15 NOTE — Progress Notes (Signed)
Nutrition Education Note ° °Received consult for diet education for patient s/p bariatric surgery. ° °Discussed 2 week post op diet with pt. Emphasized that liquids must be non carbonated, non caffeinated, and sugar free. Fluid goals discussed. Pt to follow up with outpatient bariatric RD for further diet progression after 2 weeks. Multivitamins and minerals also reviewed. Teach back method used, pt expressed understanding, expect good compliance. ° °If nutrition issues arise, please consult RD. ° °Lenetta Piche, MS, RD, LDN °Inpatient Clinical Dietitian °Contact information available via Amion ° ° °

## 2021-04-15 NOTE — Progress Notes (Signed)
Patient ID: Ashley [name], female   DOB: July 03, 1976, 45 y.o.   MRN: [id number] [facility] Surgery Progress Note:   1 Day Post-Op  Subjective: Mental status is clear.  Complaints sore abdomen. Objective: Vital signs in last 24 hours: Temp:  [97.5 F (36.4 C)-98.9 F (37.2 C)] 98.5 F (36.9 C) (07/27 1308) Pulse Rate:  [71-98] 73 (07/27 1308) Resp:  [16-20] 17 (07/27 1308) BP: (137-178)/(83-100) 150/89 (07/27 1308) SpO2:  [93 %-100 %] 99 % (07/27 1308)  Intake/Output from previous day: 07/26 0701 - 07/27 0700 In: 2371.3 [P.O.:120; I.V.:2151.3; IV Piggyback:100] Out: 3125 [Urine:3100; Blood:25] Intake/Output this shift: Total I/O In: 120 [P.O.:120] Out: 400 [Urine:400]  Physical Exam: Work of breathing is normal.  Seen earlier today.  Up walking to bathroom without assistance  Lab Results:  Results for orders placed or performed during the hospital encounter of 04/14/21 (from the past 48 hour(s))  Pregnancy, urine STAT morning of surgery     Status: None   Collection Time: 04/14/21  5:18 AM  Result Value Ref Range   Preg Test, Ur NEGATIVE NEGATIVE    Comment:        THE SENSITIVITY OF THIS METHODOLOGY IS >20 mIU/mL. Performed at [facility], [place] [address] [place], [place] [address]   Type and screen Elias-Fela Solis     Status: None   Collection Time: 04/14/21  6:20 AM  Result Value Ref Range   ABO/RH(D) B POS    Antibody Screen NEG    Sample Expiration      04/17/2021,2359 Performed at [place] [address] [place], [place] [address]   CBC     Status: Abnormal   Collection Time: 04/14/21 10:51 AM  Result Value Ref Range   WBC 8.4 4.0 - 10.5 K/uL   RBC 4.63 3.87 - 5.11 MIL/uL   Hemoglobin 11.3 (L) 12.0 - 15.0 g/dL   HCT 37.0 36.0 - 46.0 %   MCV 79.9 (L) 80.0 - 100.0 fL   MCH 24.4 (L) 26.0 - 34.0 pg   MCHC 30.5 30.0 - 36.0 g/dL   RDW 16.6 (H) 11.5 - 15.5 %   Platelets 208 150 - 400 K/uL   nRBC  0.0 0.0 - 0.2 %    Comment: Performed at [facility], [place] [address] [place], [place] [address]  Creatinine, serum     Status: None   Collection Time: 04/14/21 10:51 AM  Result Value Ref Range   Creatinine, Ser 1.00 0.44 - 1.00 mg/dL   GFR, Estimated >60 >60 mL/min    Comment: (NOTE) Calculated using the CKD-EPI Creatinine Equation (2021) Performed at [facility], [place] [address] [place], [place] [address]   Hemoglobin and hematocrit, blood     Status: None   Collection Time: 04/14/21  2:12 PM  Result Value Ref Range   Hemoglobin 12.6 12.0 - 15.0 g/dL   HCT 41.1 36.0 - 46.0 %    Comment: Performed at [facility], [place] [address], [place], [place] [address]  CBC WITH DIFFERENTIAL     Status: Abnormal   Collection Time: 04/15/21  4:36 AM  Result Value Ref Range   WBC 7.0 4.0 - 10.5 K/uL   RBC 4.72 3.87 - 5.11 MIL/uL   Hemoglobin 11.7 (L) 12.0 - 15.0 g/dL   HCT 37.6 36.0 - 46.0 %   MCV 79.7 (L) 80.0 - 100.0 fL   MCH 24.8 (L) 26.0 - 34.0  pg   MCHC 31.1 30.0 - 36.0 g/dL   RDW 16.4 (H) 11.5 - 15.5 %   Platelets 211 150 - 400 K/uL   nRBC 0.0 0.0 - 0.2 %   Neutrophils Relative % 71 %   Neutro Abs 4.9 1.7 - 7.7 K/uL   Lymphocytes Relative 20 %   Lymphs Abs 1.4 0.7 - 4.0 K/uL   Monocytes Relative 9 %   Monocytes Absolute 0.7 0.1 - 1.0 K/uL   Eosinophils Relative 0 %   Eosinophils Absolute 0.0 0.0 - 0.5 K/uL   Basophils Relative 0 %   Basophils Absolute 0.0 0.0 - 0.1 K/uL   Immature Granulocytes 0 %   Abs Immature Granulocytes 0.02 0.00 - 0.07 K/uL    Comment: Performed at [facility], [place] [address] [place], [place] [address]    Radiology/Results: No results found.  Anti-infectives: Anti-infectives (From admission, onward)    Start     Dose/Rate Route Frequency Ordered Stop   04/14/21 0600  cefoTEtan (CEFOTAN) 2 g in sodium chloride 0.9 % 100 mL IVPB        2 g 200 mL/hr over 30 Minutes  Intravenous On call to O.R. 04/13/21 0755 04/14/21 1731       Assessment/Plan: Problem List: Patient Active Problem List   Diagnosis Date Noted   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   Flat foot 05/07/2019   Abnormality of gait 02/22/2019   Lumbosacral radiculopathy at L4 06/09/2018   Generalized anxiety disorder    Pain    Fall    Labile blood pressure    Neuropathic pain    Therapeutic opioid induced constipation    Urinary retention    Lumbar radiculopathy 02/21/2018   PCOS (polycystic ovarian syndrome)    Obesity    Depression    Generalized OA    Postoperative pain    Acute blood loss anemia    Status post surgery 02/17/2018   Lumbar radiculopathy, chronic 02/17/2018   Primary localized osteoarthritis of right hip 04/21/2016   Sinusitis 06/25/2015   Cough 06/25/2015   Essential hypertension 04/03/2015   Dehydration 04/03/2015   CAP (community acquired pneumonia) 03/31/2015   Right lower lobe pneumonia 03/31/2015   Fever 03/31/2015   Pregnancy 04/29/2014   Miscarriage    Incomplete miscarriage 04/02/2011    Class: Acute   Deep vein thrombosis (DVT) ([place]) 04/02/2011    Class: Chronic    Taking liquids slowly.  Not ready for discharge today.  Patient indicated that she was reluctant to take the Lovenox shots.   1 Day Post-Op    LOS: 1 day   Matt B. Hassell Done, MD, [facility] [phone] to reach the surgeon on call.    04/15/2021 2:00 PM

## 2021-04-16 LAB — CBC WITH DIFFERENTIAL/PLATELET
Abs Immature Granulocytes: 0.01 10*3/uL (ref 0.00–0.07)
Basophils Absolute: 0 10*3/uL (ref 0.0–0.1)
Basophils Relative: 1 %
Eosinophils Absolute: 0.1 10*3/uL (ref 0.0–0.5)
Eosinophils Relative: 2 %
HCT: 36.3 % (ref 36.0–46.0)
Hemoglobin: 11.1 g/dL — ABNORMAL LOW (ref 12.0–15.0)
Immature Granulocytes: 0 %
Lymphocytes Relative: 42 %
Lymphs Abs: 2.4 10*3/uL (ref 0.7–4.0)
MCH: 24.6 pg — ABNORMAL LOW (ref 26.0–34.0)
MCHC: 30.6 g/dL (ref 30.0–36.0)
MCV: 80.5 fL (ref 80.0–100.0)
Monocytes Absolute: 0.5 10*3/uL (ref 0.1–1.0)
Monocytes Relative: 9 %
Neutro Abs: 2.6 10*3/uL (ref 1.7–7.7)
Neutrophils Relative %: 46 %
Platelets: 195 10*3/uL (ref 150–400)
RBC: 4.51 MIL/uL (ref 3.87–5.11)
RDW: 16.7 % — ABNORMAL HIGH (ref 11.5–15.5)
WBC: 5.7 10*3/uL (ref 4.0–10.5)
nRBC: 0 % (ref 0.0–0.2)

## 2021-04-16 MED ORDER — PANTOPRAZOLE SODIUM 40 MG PO TBEC: 40.0000 mg | DELAYED_RELEASE_TABLET | Freq: Every day | ORAL | 0 refills | Status: DC

## 2021-04-16 MED ORDER — OXYCODONE HCL 5 MG PO TABS: 5.0000 mg | ORAL_TABLET | Freq: Four times a day (QID) | ORAL | 0 refills | Status: DC | PRN

## 2021-04-16 MED ORDER — ENOXAPARIN SODIUM 40 MG/0.4ML IJ SOSY: 40.0000 mg | PREFILLED_SYRINGE | Freq: Two times a day (BID) | INTRAMUSCULAR | 0 refills | Status: DC

## 2021-04-16 MED ORDER — ONDANSETRON 4 MG PO TBDP: 4.0000 mg | ORAL_TABLET | Freq: Four times a day (QID) | ORAL | 0 refills | Status: DC | PRN

## 2021-04-16 NOTE — Progress Notes (Signed)
Discharge instructions discussed with patient, including administration of Lovenox, verbalized agreement and understanding

## 2021-04-16 NOTE — Progress Notes (Signed)
Patient alert and oriented, Post op day 2.  Provided support and encouragement.  Encouraged pulmonary toilet, ambulation and small sips of liquids.  Pt is experiencing some nausea after drinking protein, but is not vomiting.  Bedside RN gave Zofran, encouraged pt to walk.  Will attempt unjury protein soup. Will continue to monitor. Plan to discharge later this afternoon if nausea controlled or tolerable.

## 2021-04-16 NOTE — Discharge Summary (Signed)
Physician Discharge Summary  Patient ID: Ashley [name] MRN: [id number] DOB/AGE: June 19, 1976 45 y.o.  PCP: Scifres, Dorothy, PA-C  Admit date: 04/14/2021 Discharge date: 04/16/2021  Admission Diagnoses:  obesity  Discharge Diagnoses:  same  Active Problems:   Status post sleeve gastrectomy   Surgery:  Xi robotic sleeve gastrectomy  Discharged Condition: improved  Hospital Course:   had sleeve on Tuesday.  Took liquids more slowly and was ready for discharge on Thursday.    Consults: pharmacy regarding Lovenox  Significant Diagnostic Studies: none    Discharge Exam: Blood pressure (!) 148/89, pulse 66, temperature 98.6 F (37 C), temperature source Oral, resp. rate 16, height 6' (1.829 m), weight 135.6 kg, SpO2 99 %. Incisions OK  Disposition:    Allergies as of 04/16/2021       Reactions   Gadolinium Derivatives Nausea And Vomiting, Other (See Comments)   Pt describes this happens every time she gets gado even with slow injection   Duloxetine    Other reaction(s): insomia   Codeine Itching, Nausea Only, Other (See Comments)   Hydrocodone does not affect pt   Cymbalta [duloxetine Hcl] Other (See Comments)   Altered mental status        Medication List     STOP taking these medications    gentamicin cream 0.1 % Commonly known as: GARAMYCIN       TAKE these medications    amLODipine 2.5 MG tablet Commonly known as: NORVASC Take 2.5 mg by mouth daily.   buPROPion 150 MG 24 hr tablet Commonly known as: WELLBUTRIN XL Take 450 mg by mouth daily.   cetirizine 10 MG tablet Commonly known as: ZYRTEC Take 10 mg by mouth daily as needed for allergies.   enoxaparin 40 MG/0.4ML injection Commonly known as: LOVENOX Inject 0.4 mLs (40 mg total) into the skin every 12 (twelve) hours for 28 days.   EPINEPHrine 0.3 mg/0.3 mL Soaj injection Commonly known as: EPI-PEN Inject 3 mLs into the muscle once.   fluticasone 50 MCG/ACT nasal spray Commonly known  as: FLONASE Place 1 spray into both nostrils daily as needed for allergies.   furosemide 20 MG tablet Commonly known as: LASIX Take 20 mg by mouth daily as needed for edema.   glycopyrrolate 1 MG tablet Commonly known as: ROBINUL Take 1 mg by mouth daily.   levocetirizine 5 MG tablet Commonly known as: XYZAL Take 5 mg by mouth every evening.   lidocaine 5 % Commonly known as: LIDODERM Place 1 patch onto the skin 2 (two) times daily.   losartan-hydrochlorothiazide 100-12.5 MG tablet Commonly known as: HYZAAR Take 1 tablet by mouth daily.   multivitamin with minerals tablet Take 1 tablet daily by mouth.   ondansetron 4 MG disintegrating tablet Commonly known as: ZOFRAN-ODT Take 1 tablet (4 mg total) by mouth every 6 (six) hours as needed for nausea or vomiting.   oxyCODONE 5 MG immediate release tablet Commonly known as: Oxy IR/ROXICODONE Take 1 tablet (5 mg total) by mouth every 6 (six) hours as needed for severe pain.   pantoprazole 40 MG tablet Commonly known as: PROTONIX Take 1 tablet (40 mg total) by mouth daily.   pregabalin 75 MG capsule Commonly known as: Lyrica Take 1 capsule (75 mg total) by mouth 3 (three) times daily.   ProAir RespiClick [id number] (90 Base) MCG/ACT Aepb Generic drug: Albuterol Sulfate Inhale 1 puff into the lungs every 4 (four) hours as needed for cough or wheezing.   valACYclovir 500 MG tablet  Commonly known as: VALTREX Take 500 mg by mouth daily as needed.   Vyvanse 50 MG capsule Generic drug: lisdexamfetamine Take 50 mg by mouth every morning.       ASK your doctor about these medications    baclofen 10 MG tablet Commonly known as: LIORESAL Take 3 tablets (30 mg total) by mouth 3 (three) times daily.   oxyCODONE-acetaminophen 10-325 MG tablet Commonly known as: PERCOCET Take 1 tablet by mouth every 4 (four) hours as needed.        Follow-up Information     Surgery, [place]. Go on 05/06/2021.   Specialty: General  Surgery Why: at 1:30pm with Dr. Hassell Done.  Please arrive 15 minutes prior to your appointment times. Contact information: [place] [address] [phone]         Surgery, [place]. Go on 06/10/2021.   Specialty: General Surgery Why: at 10:45am with Dr. Hassell Done.  Please arrive 15 minutes prior to your appointment time.  Thank you. Contact information: [address] [place] [address] [phone]                 Signed: Pedro Earls 04/16/2021, 7:26 AM

## 2021-04-17 ENCOUNTER — Telehealth (HOSPITAL_COMMUNITY): Payer: Self-pay | Admitting: *Deleted

## 2021-04-17 ENCOUNTER — Other Ambulatory Visit (HOSPITAL_COMMUNITY): Payer: Self-pay

## 2021-04-17 NOTE — Telephone Encounter (Signed)
1.  Tell me about your pain and pain management? Pt suffers from chronic pain, but states that she is having some incisional pain, but it is tolerable at the moment.  Most discomfort is from the right abdominal incision beside the umbilicus.  Pt denies any pain when sipping/drinking liquids.  2.  Let's talk about fluid intake.  How much total fluid are you taking in? Pt states that she is working to meet goal of 64 oz of fluid today.  Pt has consumed 1/2 protein shake.  Pt plans to drink rest of protein and water for the remainder of the day to meet goal.  Pt encouraged to continue to work towards meeting goal.  Pt instructed to assess status and suggestions daily utilizing Hydration Action Plan on discharge folder and to call [facility] if in the "red zone".   3.  How much protein have you taken in the last 2 days? Pt states that she is working to meet goal of goal of 60g of protein today.  Pt has already consumed 1/2 Premiere protein shake.  Pt plans to drink remainder of protein throughout the rest of the day to meet goal.  Pt is also planning to purchase some Unjury soup from [employer].  4.  Have you had nausea?  Tell me about when have experienced nausea and what you did to help? Pt currently denies nausea.  Pt states that sipping helps to reduce nausea and is tolerating Premiere protein shakes at home better than the Ensure protein shakes while in the hospital.    5.  Has the frequency or color changed with your urine? Pt states that she is urinating "fine" with no changes in frequency or urgency.     6.  Tell me what your incisions look like? "Incisions look fine". Pt denies a fever, chills.  Pt states incisions are not swollen, open, or draining.  Pt encouraged to call [facility] if incisions change.   7.  Have you been passing gas? BM? Pt states that she has not had a BM.  Pt instructed to take either Miralax or MoM as instructed per "Gastric Bypass/Sleeve Discharge Home Care Instructions".  Pt  to call surgeon's office if not able to have BM with medication.   8.  If a problem or question were to arise who would you call?  Do you know contact numbers for [facility], [facility], and [facility]? Pt denies dehydration symptoms.  Pt can describe s/sx of dehydration.  Pt knows to call [facility] for surgical, [facility] for nutrition, and [facility] for non-urgent questions or concerns.   9.  How has the walking going? Pt states she is walking around and able to be active without difficulty.   10. Are you still using your incentive spirometer?  If so, how often? Pt encouraged to use incentive spirometer, at least 10x every hour while awake until she sees the surgeon.  11.  How are your vitamins and calcium going?  How are you taking them? Pt states that she will start taking her vitamins today.  Pt has received Lovenox injections from pharmacy and will begin to give self injections today.  Reminded patient that the first 30 days post-operatively are important for successful recovery.  Practice good hand hygiene, wearing a mask when appropriate (since optional in most places), and minimizing exposure to people who live outside of the home, especially if they are exhibiting any respiratory, GI, or illness-like symptoms.

## 2021-04-17 NOTE — Progress Notes (Signed)
24hr fluid recall prior to discharge: 435m.  Per dehydration protocol, will call pt to f/u within one week post op.

## 2021-04-17 NOTE — Telephone Encounter (Signed)
Followed up with pt to discuss protein and fluid intake.   Pt states that she is working to meet fluid and protein goal.  Pt has consumed a protein shake, some Unjury protein chicken soup, bottled water and 1/4 bottle of protein water. Pt plans to continue to sip on water for the remainder of the day to meet fluid goal.  Pt encouraged to continue to work towards meeting goals.  Pt instructed to assess status and suggestions daily utilizing Hydration Action Plan on discharge folder and to call [facility] if in the "red zone".  Pt stated that she did well with Lovenox self injection this morning. Will continue to monitor as needed.

## 2021-04-20 LAB — SURGICAL PATHOLOGY

## 2021-04-22 DIAGNOSIS — J3081 Allergic rhinitis due to animal (cat) (dog) hair and dander: Secondary | ICD-10-CM | POA: Diagnosis not present

## 2021-04-22 DIAGNOSIS — J301 Allergic rhinitis due to pollen: Secondary | ICD-10-CM | POA: Diagnosis not present

## 2021-04-22 DIAGNOSIS — Z79899 Other long term (current) drug therapy: Secondary | ICD-10-CM | POA: Diagnosis not present

## 2021-04-22 DIAGNOSIS — J3089 Other allergic rhinitis: Secondary | ICD-10-CM | POA: Diagnosis not present

## 2021-04-27 DIAGNOSIS — J3089 Other allergic rhinitis: Secondary | ICD-10-CM | POA: Diagnosis not present

## 2021-04-27 DIAGNOSIS — J301 Allergic rhinitis due to pollen: Secondary | ICD-10-CM | POA: Diagnosis not present

## 2021-04-27 DIAGNOSIS — J3081 Allergic rhinitis due to animal (cat) (dog) hair and dander: Secondary | ICD-10-CM | POA: Diagnosis not present

## 2021-04-28 ENCOUNTER — Other Ambulatory Visit: Payer: Self-pay

## 2021-04-28 ENCOUNTER — Encounter: Payer: Medicare Other | Attending: Surgery | Admitting: Skilled Nursing Facility1

## 2021-04-28 DIAGNOSIS — E669 Obesity, unspecified: Secondary | ICD-10-CM | POA: Diagnosis not present

## 2021-04-28 DIAGNOSIS — Z6839 Body mass index (BMI) 39.0-39.9, adult: Secondary | ICD-10-CM | POA: Insufficient documentation

## 2021-04-29 NOTE — Progress Notes (Signed)
2 Week Post-Operative Nutrition Class   Patient was seen on 04/28/2021 for Post-Operative Nutrition education at the [facility].   Surgery date: 04/14/2021 Surgery type: sleeve Start weight at [facility]: 298 Weight today: 281.3 pounds Bowel Habits: Every day to every other day no complaints   Body Composition Scale 04/29/2021  Current Body Weight 281.3  Total Body Fat % 44.2  Visceral Fat 14  Fat-Free Mass % 55.7   Total Body Water % 42.3  Muscle-Mass lbs 37.3  BMI 39.1  Body Fat Displacement          Torso  lbs 77.2         Left Leg  lbs 15.4         Right Leg  lbs 15.4         Left Arm  lbs 7.7         Right Arm   lbs 7.7      The following the learning objectives were met by the patient during this course: Identifies Phase 3 (Soft, High Proteins) Dietary Goals and will begin from 2 weeks post-operatively to 2 months post-operatively Identifies appropriate sources of fluids and proteins  Identifies appropriate fat sources and healthy verses unhealthy fat types   States protein recommendations and appropriate sources post-operatively Identifies the need for appropriate texture modifications, mastication, and bite sizes when consuming solids Identifies appropriate fat consumption and sources Identifies appropriate multivitamin and calcium sources post-operatively Describes the need for physical activity post-operatively and will follow MD recommendations States when to call healthcare provider regarding medication questions or post-operative complications   Handouts given during class include: Phase 3A: Soft, High Protein Diet Handout Phase 3 High Protein Meals Healthy Fats   Follow-Up Plan: Patient will follow-up at [facility] in 6 weeks for 2 month post-op nutrition visit for diet advancement per MD.

## 2021-05-04 ENCOUNTER — Telehealth: Payer: Self-pay | Admitting: Skilled Nursing Facility1

## 2021-05-04 NOTE — Telephone Encounter (Signed)
RD called pt to verify fluid intake once starting soft, solid proteins 2 week post-bariatric surgery.   Daily Fluid intake:  Daily Protein intake: Bowel Habits:   Concerns/issues:    LVM 

## 2021-05-05 DIAGNOSIS — J3081 Allergic rhinitis due to animal (cat) (dog) hair and dander: Secondary | ICD-10-CM | POA: Diagnosis not present

## 2021-05-05 DIAGNOSIS — I8312 Varicose veins of left lower extremity with inflammation: Secondary | ICD-10-CM | POA: Diagnosis not present

## 2021-05-05 DIAGNOSIS — J301 Allergic rhinitis due to pollen: Secondary | ICD-10-CM | POA: Diagnosis not present

## 2021-05-05 DIAGNOSIS — J3089 Other allergic rhinitis: Secondary | ICD-10-CM | POA: Diagnosis not present

## 2021-05-07 DIAGNOSIS — I8312 Varicose veins of left lower extremity with inflammation: Secondary | ICD-10-CM | POA: Diagnosis not present

## 2021-05-08 DIAGNOSIS — I8311 Varicose veins of right lower extremity with inflammation: Secondary | ICD-10-CM | POA: Diagnosis not present

## 2021-05-12 DIAGNOSIS — I8311 Varicose veins of right lower extremity with inflammation: Secondary | ICD-10-CM | POA: Diagnosis not present

## 2021-05-12 DIAGNOSIS — J3089 Other allergic rhinitis: Secondary | ICD-10-CM | POA: Diagnosis not present

## 2021-05-12 DIAGNOSIS — J3081 Allergic rhinitis due to animal (cat) (dog) hair and dander: Secondary | ICD-10-CM | POA: Diagnosis not present

## 2021-05-12 DIAGNOSIS — J301 Allergic rhinitis due to pollen: Secondary | ICD-10-CM | POA: Diagnosis not present

## 2021-05-13 ENCOUNTER — Telehealth: Payer: Self-pay | Admitting: Podiatry

## 2021-05-13 NOTE — Telephone Encounter (Signed)
Pt called requesting to speak with you directly. She has questions in regards to her surgery from May. Please advise.

## 2021-05-14 ENCOUNTER — Encounter: Payer: Self-pay | Admitting: Podiatry

## 2021-05-14 DIAGNOSIS — G4700 Insomnia, unspecified: Secondary | ICD-10-CM | POA: Diagnosis not present

## 2021-05-14 DIAGNOSIS — M47817 Spondylosis without myelopathy or radiculopathy, lumbosacral region: Secondary | ICD-10-CM | POA: Diagnosis not present

## 2021-05-14 DIAGNOSIS — G894 Chronic pain syndrome: Secondary | ICD-10-CM | POA: Diagnosis not present

## 2021-05-18 DIAGNOSIS — G8929 Other chronic pain: Secondary | ICD-10-CM | POA: Diagnosis not present

## 2021-05-18 DIAGNOSIS — E7800 Pure hypercholesterolemia, unspecified: Secondary | ICD-10-CM | POA: Diagnosis not present

## 2021-05-18 DIAGNOSIS — G4700 Insomnia, unspecified: Secondary | ICD-10-CM | POA: Diagnosis not present

## 2021-05-18 DIAGNOSIS — D509 Iron deficiency anemia, unspecified: Secondary | ICD-10-CM | POA: Diagnosis not present

## 2021-05-18 DIAGNOSIS — I10 Essential (primary) hypertension: Secondary | ICD-10-CM | POA: Diagnosis not present

## 2021-05-19 ENCOUNTER — Encounter: Payer: Self-pay | Admitting: *Deleted

## 2021-05-19 ENCOUNTER — Telehealth: Payer: Self-pay | Admitting: Podiatry

## 2021-05-19 DIAGNOSIS — J3081 Allergic rhinitis due to animal (cat) (dog) hair and dander: Secondary | ICD-10-CM | POA: Diagnosis not present

## 2021-05-19 DIAGNOSIS — J3089 Other allergic rhinitis: Secondary | ICD-10-CM | POA: Diagnosis not present

## 2021-05-19 DIAGNOSIS — J301 Allergic rhinitis due to pollen: Secondary | ICD-10-CM | POA: Diagnosis not present

## 2021-05-19 NOTE — Telephone Encounter (Signed)
Please call patient about about concerns about her sx foot. She is needing a letter. But she would like Dr. Amalia Hailey to call so she can explain.  Please call asap

## 2021-05-19 NOTE — Telephone Encounter (Signed)
Spoke with patient.  Thanks, Dr. Shakirah Kirkey

## 2021-05-19 NOTE — Telephone Encounter (Signed)
Please provide a note for the patient printed on letterhead and contact her when it is ready for pickup at the front desk.  " To whom it may concern, Patient's surgery on 02/05/2021 included resection of a distal portion of an orthopedic screw within the third toe of the right foot.  The screw was symptomatic and developed a painful nodule to the distal tip of the toe.  There was a prior attempt at removal of the orthopedic screw on 08/21/2020 which was unsuccessful due to the screw being embedded within the bone.  Torque of the screw caused it to bend which developed a painful nodule over the course of the following 6 months.  Please feel free to contact our office for any questions.  Regards, Dr. Edrick Kins"

## 2021-05-19 NOTE — Progress Notes (Deleted)
Letter written per Dr Amalia Hailey. The patient has been notified that she can be picked up at front desk.

## 2021-05-20 ENCOUNTER — Encounter: Payer: Self-pay | Admitting: Podiatry

## 2021-05-20 DIAGNOSIS — H527 Unspecified disorder of refraction: Secondary | ICD-10-CM | POA: Diagnosis not present

## 2021-05-26 DIAGNOSIS — Z03818 Encounter for observation for suspected exposure to other biological agents ruled out: Secondary | ICD-10-CM | POA: Diagnosis not present

## 2021-05-26 DIAGNOSIS — Z20822 Contact with and (suspected) exposure to covid-19: Secondary | ICD-10-CM | POA: Diagnosis not present

## 2021-05-28 DIAGNOSIS — J3089 Other allergic rhinitis: Secondary | ICD-10-CM | POA: Diagnosis not present

## 2021-05-28 DIAGNOSIS — J3081 Allergic rhinitis due to animal (cat) (dog) hair and dander: Secondary | ICD-10-CM | POA: Diagnosis not present

## 2021-05-28 DIAGNOSIS — J301 Allergic rhinitis due to pollen: Secondary | ICD-10-CM | POA: Diagnosis not present

## 2021-06-02 DIAGNOSIS — J3081 Allergic rhinitis due to animal (cat) (dog) hair and dander: Secondary | ICD-10-CM | POA: Diagnosis not present

## 2021-06-02 DIAGNOSIS — J3089 Other allergic rhinitis: Secondary | ICD-10-CM | POA: Diagnosis not present

## 2021-06-02 DIAGNOSIS — Z03818 Encounter for observation for suspected exposure to other biological agents ruled out: Secondary | ICD-10-CM | POA: Diagnosis not present

## 2021-06-02 DIAGNOSIS — J301 Allergic rhinitis due to pollen: Secondary | ICD-10-CM | POA: Diagnosis not present

## 2021-06-02 DIAGNOSIS — Z20822 Contact with and (suspected) exposure to covid-19: Secondary | ICD-10-CM | POA: Diagnosis not present

## 2021-06-04 ENCOUNTER — Encounter: Payer: Medicare Other | Attending: Surgery | Admitting: Skilled Nursing Facility1

## 2021-06-04 ENCOUNTER — Other Ambulatory Visit: Payer: Self-pay

## 2021-06-04 DIAGNOSIS — I10 Essential (primary) hypertension: Secondary | ICD-10-CM | POA: Insufficient documentation

## 2021-06-04 DIAGNOSIS — E669 Obesity, unspecified: Secondary | ICD-10-CM

## 2021-06-04 DIAGNOSIS — Z9884 Bariatric surgery status: Secondary | ICD-10-CM | POA: Diagnosis not present

## 2021-06-04 DIAGNOSIS — Z713 Dietary counseling and surveillance: Secondary | ICD-10-CM | POA: Diagnosis not present

## 2021-06-04 NOTE — Progress Notes (Signed)
Bariatric Nutrition Follow-Up Visit Medical Nutrition Therapy   NUTRITION ASSESSMENT    Anthropometrics  Start weight at [facility]: 298 lbs (date: 07/08/2020) Today's weight: 274.6 pounds  Clinical  Medical Hx: HTN, PCOS Medications: see list Labs:  Notable Signs/Symptoms: chronic pain  Body Composition Scale 04/29/2021 06/04/2021  Current Body Weight 281.3 274.6  Total Body Fat % 44.2 43.6  Visceral Fat 14 13  Fat-Free Mass % 55.7 56.3   Total Body Water % 42.3 42.6  Muscle-Mass lbs 37.3 37.3  BMI 39.1 38.1  Body Fat Displacement           Torso  lbs 77.2 74.2         Left Leg  lbs 15.4 14.8         Right Leg  lbs 15.4 14.8         Left Arm  lbs 7.7 7.4         Right Arm   lbs 7.7 7.4     Lifestyle & Dietary Hx  Pt state she is having a lot of either hunger or appetite feelings throughout the day unsure which. Pt states she doe snot eat the leftovers from her kid.   Pt state she will start being physically active.   Estimated daily fluid intake: 64+ oz Estimated daily protein intake: 70+ g Supplements: multi and calcium  Current average weekly physical activity: ADL's   24-Hr Dietary Recall First Meal: 2 boiled eggs+ [place] sausage or protein shake Snack:  cheese Second Meal: [place] sausage and cheese omelet or chicken salad Snack:   Third Meal: salmon Snack: sugar free jello or popcicle Beverages: water  Post-Op Goals/ Signs/ Symptoms Using straws: no Drinking while eating: no Chewing/swallowing difficulties: no Changes in vision: no Changes to mood/headaches: no Hair loss/changes to skin/nails: no Difficulty focusing/concentrating: no Sweating: no Dizziness/lightheadedness: no Palpitations: no  Carbonated/caffeinated beverages: no N/V/D/C/Gas: no Abdominal pain: no Dumping syndrome: no    NUTRITION DIAGNOSIS  Overweight/obesity ([place]-3.3) related to past poor dietary habits and physical inactivity as evidenced by completed bariatric surgery and  following dietary guidelines for continued weight loss and healthy nutrition status.     NUTRITION INTERVENTION Nutrition counseling (C-1) and education (E-2) to facilitate bariatric surgery goals, including: Diet advancement to the next phase (phase 4) now including none startchy vegetables  The importance of consuming adequate calories as well as certain nutrients daily due to the body's need for essential vitamins, minerals, and fats The importance of daily physical activity and to reach a goal of at least 150 minutes of moderate to vigorous physical activity weekly (or as directed by their physician) due to benefits such as increased musculature and improved lab values The importance of intuitive eating specifically learning hunger-satiety cues and understanding the importance of learning a new body: The importance of mindful eating to avoid grazing behaviors  Goals: -Continue to aim for a minimum of 64 fluid ounces 7 days a week with at least 30 ounces being plain water  -Eat non-starchy vegetables 2 times a day 7 days a week  -Start out with soft cooked vegetables today and tomorrow; if tolerated begin to eat raw vegetables or cooked including salads  -Eat your 3 ounces of protein first then start in on your non-starchy vegetables; once you understand how much of your meal leads to satisfaction and not full while still eating 3 ounces of protein and non-starchy vegetables you can eat them in any order   -Continue to aim for 30 minutes  of activity at least 5 times a week  -Do NOT cook with/add to your food: alfredo sauce, cheese sauce, barbeque sauce, ketchup, fat back, butter, bacon grease, grease, Crisco, OR SUGAR   Handouts Provided Include  Phase 4  Learning Style & Readiness for Change Teaching method utilized: Visual & Auditory  Demonstrated degree of understanding via: Teach Back  Readiness Level: action Barriers to learning/adherence to lifestyle change: non identified    RD's Notes for Next Visit Assess adherence to pt chosen goals    MONITORING & EVALUATION Dietary intake, weekly physical activity, body weight Next Steps Patient is to follow-up in 3-4 months

## 2021-06-05 DIAGNOSIS — G8929 Other chronic pain: Secondary | ICD-10-CM | POA: Diagnosis not present

## 2021-06-05 DIAGNOSIS — I10 Essential (primary) hypertension: Secondary | ICD-10-CM | POA: Diagnosis not present

## 2021-06-09 ENCOUNTER — Ambulatory Visit: Payer: Medicare Other | Admitting: Skilled Nursing Facility1

## 2021-06-10 DIAGNOSIS — J301 Allergic rhinitis due to pollen: Secondary | ICD-10-CM | POA: Diagnosis not present

## 2021-06-10 DIAGNOSIS — J3089 Other allergic rhinitis: Secondary | ICD-10-CM | POA: Diagnosis not present

## 2021-06-10 DIAGNOSIS — J3081 Allergic rhinitis due to animal (cat) (dog) hair and dander: Secondary | ICD-10-CM | POA: Diagnosis not present

## 2021-06-11 DIAGNOSIS — J3081 Allergic rhinitis due to animal (cat) (dog) hair and dander: Secondary | ICD-10-CM | POA: Diagnosis not present

## 2021-06-11 DIAGNOSIS — J301 Allergic rhinitis due to pollen: Secondary | ICD-10-CM | POA: Diagnosis not present

## 2021-06-11 DIAGNOSIS — J3089 Other allergic rhinitis: Secondary | ICD-10-CM | POA: Diagnosis not present

## 2021-06-16 DIAGNOSIS — J3081 Allergic rhinitis due to animal (cat) (dog) hair and dander: Secondary | ICD-10-CM | POA: Diagnosis not present

## 2021-06-16 DIAGNOSIS — J301 Allergic rhinitis due to pollen: Secondary | ICD-10-CM | POA: Diagnosis not present

## 2021-06-16 DIAGNOSIS — J3089 Other allergic rhinitis: Secondary | ICD-10-CM | POA: Diagnosis not present

## 2021-06-16 DIAGNOSIS — H1045 Other chronic allergic conjunctivitis: Secondary | ICD-10-CM | POA: Diagnosis not present

## 2021-06-23 DIAGNOSIS — J3081 Allergic rhinitis due to animal (cat) (dog) hair and dander: Secondary | ICD-10-CM | POA: Diagnosis not present

## 2021-06-23 DIAGNOSIS — I8311 Varicose veins of right lower extremity with inflammation: Secondary | ICD-10-CM | POA: Diagnosis not present

## 2021-06-23 DIAGNOSIS — J301 Allergic rhinitis due to pollen: Secondary | ICD-10-CM | POA: Diagnosis not present

## 2021-06-23 DIAGNOSIS — J3089 Other allergic rhinitis: Secondary | ICD-10-CM | POA: Diagnosis not present

## 2021-06-25 DIAGNOSIS — I8311 Varicose veins of right lower extremity with inflammation: Secondary | ICD-10-CM | POA: Diagnosis not present

## 2021-06-30 DIAGNOSIS — J3081 Allergic rhinitis due to animal (cat) (dog) hair and dander: Secondary | ICD-10-CM | POA: Diagnosis not present

## 2021-06-30 DIAGNOSIS — J301 Allergic rhinitis due to pollen: Secondary | ICD-10-CM | POA: Diagnosis not present

## 2021-06-30 DIAGNOSIS — J3089 Other allergic rhinitis: Secondary | ICD-10-CM | POA: Diagnosis not present

## 2021-07-01 ENCOUNTER — Ambulatory Visit (INDEPENDENT_AMBULATORY_CARE_PROVIDER_SITE_OTHER): Payer: Medicare Other

## 2021-07-01 ENCOUNTER — Ambulatory Visit
Admission: EM | Admit: 2021-07-01 | Discharge: 2021-07-01 | Disposition: A | Discharge status: Home / Self Care | Payer: Medicare Other | Attending: Internal Medicine | Admitting: Internal Medicine

## 2021-07-01 ENCOUNTER — Other Ambulatory Visit: Payer: Self-pay

## 2021-07-01 ENCOUNTER — Encounter: Payer: Self-pay | Admitting: Emergency Medicine

## 2021-07-01 DIAGNOSIS — M545 Low back pain, unspecified: Secondary | ICD-10-CM | POA: Diagnosis not present

## 2021-07-01 DIAGNOSIS — I10 Essential (primary) hypertension: Secondary | ICD-10-CM | POA: Diagnosis not present

## 2021-07-01 DIAGNOSIS — W19XXXA Unspecified fall, initial encounter: Secondary | ICD-10-CM | POA: Diagnosis not present

## 2021-07-01 DIAGNOSIS — M47816 Spondylosis without myelopathy or radiculopathy, lumbar region: Secondary | ICD-10-CM | POA: Diagnosis not present

## 2021-07-01 DIAGNOSIS — M5416 Radiculopathy, lumbar region: Secondary | ICD-10-CM | POA: Diagnosis not present

## 2021-07-01 MED ORDER — KETOROLAC TROMETHAMINE 30 MG/ML IJ SOLN
30.0000 mg | Freq: Once | INTRAMUSCULAR | Status: AC
  Administered 2021-07-01: 30 mg via INTRAMUSCULAR

## 2021-07-01 NOTE — ED Triage Notes (Signed)
Patient c/o low back pain after a fall while walking the dog yesterday.  Patient did have a history of back surgery.  Patient has taken Oxycodone for pain.

## 2021-07-01 NOTE — ED Provider Notes (Addendum)
[facility]    CSN: [id number] Arrival date & time: 07/01/21  1816      History   Chief Complaint Chief Complaint  Patient presents with   Back Pain    HPI Ashley [name] is a 45 y.o. female.   Patient presents with low back pain that started yesterday after a fall that occurred.  Patient reports that she was walking dog yesterday when she fell onto her back on the grass.  Denies hitting head or losing consciousness.  Has taken oxycodone for pain.  Patient does have history of spinal fusion and spondylosis as well, so she is concerned.  Denies numbness or tingling, saddle anesthesia, urinary or bowel incontinence.  Able to ambulate well.   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   Flat foot 05/07/2019   Abnormality of gait 02/22/2019   Lumbosacral radiculopathy at L4 06/09/2018   Generalized anxiety disorder    Pain    Fall    Labile blood pressure    Neuropathic pain    Therapeutic opioid induced constipation    Urinary retention    Lumbar radiculopathy 02/21/2018   PCOS (polycystic ovarian syndrome)    Obesity    Depression    Generalized OA    Postoperative pain    Acute blood loss anemia    Status post surgery 02/17/2018   Lumbar radiculopathy, chronic 02/17/2018   Primary localized osteoarthritis of right hip 04/21/2016   Sinusitis 06/25/2015   Cough 06/25/2015   Essential hypertension 04/03/2015   Dehydration 04/03/2015   CAP (community acquired pneumonia) 03/31/2015   Right lower lobe pneumonia 03/31/2015   Fever 03/31/2015   Pregnancy  04/29/2014   Miscarriage    Incomplete miscarriage 04/02/2011    Class: Acute   Deep vein thrombosis (DVT) ([place]) 04/02/2011    Class: Chronic    Past Surgical History:  Procedure Laterality Date   ABDOMINAL EXPOSURE N/A 02/17/2018   Procedure: ABDOMINAL EXPOSURE;  Surgeon: Rosetta Posner, MD;  Location: [facility];  Service: Vascular;  Laterality: N/A;   ANKLE SURGERY     x 2   ANTERIOR LAT LUMBAR FUSION N/A 02/17/2018   Procedure: Lumbar three-four Lumbar four-five  Anterolateral lumbar interbody fusion;  Surgeon: Kristeen Miss, MD;  Location: [place];  Service: Neurosurgery;  Laterality: N/A;   ANTERIOR LUMBAR FUSION N/A 02/17/2018   Procedure: Lumbar five-Sacral one Anterior lumbar interbody fusion;  Surgeon: Kristeen Miss, MD;  Location: [place];  Service: Neurosurgery;  Laterality: N/A;   APPLICATION OF ROBOTIC ASSISTANCE FOR SPINAL PROCEDURE N/A 02/17/2018   Procedure: APPLICATION OF ROBOTIC ASSISTANCE FOR SPINAL PROCEDURE;  Surgeon: Kristeen Miss, MD;  Location: [place];  Service: Neurosurgery;  Laterality: N/A;   APPLICATION OF ROBOTIC ASSISTANCE FOR SPINAL PROCEDURE N/A 06/09/2018   Procedure: APPLICATION OF ROBOTIC ASSISTANCE FOR SPINAL PROCEDURE;  Surgeon: Kristeen Miss, MD;  Location: [place];  Service: Neurosurgery;  Laterality: N/A;   BACK SURGERY     2017  discectomy   CARPAL  TUNNEL RELEASE Left 07/04/2014   Procedure: LEFT CARPAL TUNNEL RELEASE;  Surgeon: Ninetta Lights, MD;  Location: [place];  Service: Orthopedics;  Laterality: Left;   CARPAL TUNNEL RELEASE Right 08/08/2014   Procedure: RIGHT CARPAL TUNNEL RELEASE;  Surgeon: Ninetta Lights, MD;  Location: [place];  Service: Orthopedics;  Laterality: Right;   DILATION AND CURETTAGE OF UTERUS     DILATION AND EVACUATION  04/02/2011   Procedure: DILATATION AND EVACUATION (D&E);  Surgeon: Luz Lex, MD;  Location: [facility];  Service: Gynecology;  Laterality: N/A;  dvt left mid thigh   DORSAL COMPARTMENT  RELEASE Left 07/04/2014   Procedure: LEFT DEQUERVAINS;  Surgeon: Ninetta Lights, MD;  Location: [place];  Service: Orthopedics;  Laterality: Left;   ENDOSCOPIC PLANTAR FASCIOTOMY Left 02/01/2002   HARDWARE REMOVAL Right 06/09/2018   Procedure: Repositioning of Right Lumbar three Right lumbar  four pedicle screw with MAZOR;  Surgeon: Kristeen Miss, MD;  Location: [place];  Service: Neurosurgery;  Laterality: Right;   HYSTEROSCOPY WITH D & C  07/17/2010   with exc. endometrial polyps   KNEE ARTHROSCOPY Right 03/07/2003; 01/14/2005; 08/31/2006   KNEE ARTHROSCOPY Left 03/21/2003; 11/26/2004   LUMBAR PERCUTANEOUS PEDICLE SCREW 3 LEVEL N/A 02/17/2018   Procedure: LUMBAR PERCUTANEOUS PEDICLE SCREW PLACEMENT LUMBAR THREE-SACRAL ONE;  Surgeon: Kristeen Miss, MD;  Location: [place];  Service: Neurosurgery;  Laterality: N/A;   SHOULDER ARTHROSCOPY Right    TOE SURGERY Left 01/10/2003   claw toe correction 2nd, 3rd, 4th toes   TOTAL HIP ARTHROPLASTY Right 04/21/2016   TOTAL HIP ARTHROPLASTY Right 04/21/2016   Procedure: TOTAL HIP ARTHROPLASTY ANTERIOR APPROACH;  Surgeon: Ninetta Lights, MD;  Location: [place];  Service: Orthopedics;  Laterality: Right;   TOTAL KNEE ARTHROPLASTY Left 01/21/2010   TOTAL KNEE ARTHROPLASTY Right 08/26/2008   UPPER GI ENDOSCOPY N/A 04/14/2021   Procedure: UPPER GI ENDOSCOPY;  Surgeon: Johnathan Hausen, MD;  Location: [facility];  Service: General;  Laterality: N/A;    OB History     Gravida  4   Para  2   Term  1   Preterm  1   AB  2   Living  2      SAB  1   IAB  1   Ectopic      Multiple      Live Births  2            Home Medications    Prior to Admission medications   Medication Sig Start Date End Date Taking? Authorizing Provider  Albuterol Sulfate (PROAIR RESPICLICK) 223 (90 Base) MCG/ACT AEPB Inhale 1 puff into the lungs every 4 (four) hours as needed for cough or wheezing.   Yes [provider]  amLODipine (NORVASC) 2.5 MG tablet  Take 2.5 mg by mouth daily.   Yes [provider]  baclofen (LIORESAL) 10 MG tablet Take 3 tablets (30 mg total) by mouth 3 (three) times daily. Patient taking differently: Take 10 mg by mouth 3 (three) times daily. 01/26/19  Yes Jamse Arn, MD  buPROPion (WELLBUTRIN XL) 150 MG 24 hr tablet Take 450 mg by mouth daily. 01/21/21  Yes [provider]  cetirizine (ZYRTEC) 10 MG tablet Take 10 mg by mouth daily as needed for allergies.    Yes [provider]  EPINEPHrine 0.3 mg/0.3 mL IJ SOAJ injection Inject 3 mLs into the muscle once. 05/26/18  Yes [provider]  fluticasone (FLONASE) 50 MCG/ACT nasal spray Place 1 spray into both nostrils daily as needed for allergies. 03/03/21  Yes [provider]  furosemide (LASIX) 20 MG tablet Take 20 mg by mouth daily as needed for edema. 02/18/21  Yes [provider]  glycopyrrolate (ROBINUL) 1 MG tablet Take 1 mg by mouth daily. 02/08/21  Yes [provider]  levocetirizine (XYZAL) 5 MG tablet Take 5 mg by mouth every evening. 03/03/21  Yes [provider]  lidocaine (LIDODERM) 5 % Place 1 patch onto the skin 2 (two) times daily. 02/25/21  Yes [provider]  losartan-hydrochlorothiazide (HYZAAR) 100-12.5 MG tablet Take 1 tablet by mouth daily. 01/29/21  Yes [provider]  Multiple Vitamins-Minerals (MULTIVITAMIN WITH MINERALS) tablet Take 1 tablet daily by mouth.   Yes [provider]  ondansetron (ZOFRAN-ODT) 4 MG disintegrating tablet Take 1 tablet (4 mg total) by mouth every 6 (six) hours as needed for nausea or vomiting. 04/16/21  Yes Johnathan Hausen, MD  oxyCODONE (OXY IR/ROXICODONE) 5 MG immediate release tablet Take 1 tablet (5 mg total) by mouth every 6 (six) hours as needed for severe pain. 04/16/21  Yes Johnathan Hausen, MD  oxyCODONE-acetaminophen (PERCOCET) 10-325 MG tablet Take 1 tablet by mouth every 4 (four) hours as needed. Patient taking differently:  Take 1 tablet by mouth every 6 (six) hours as needed for pain. 06/12/18  Yes Kristeen Miss, MD  pantoprazole (PROTONIX) 40 MG tablet Take 1 tablet (40 mg total) by mouth daily. 04/16/21  Yes Johnathan Hausen, MD  pregabalin (LYRICA) 75 MG capsule Take 1 capsule (75 mg total) by mouth 3 (three) times daily. 01/26/19  Yes Jamse Arn, MD  valACYclovir (VALTREX) 500 MG tablet Take 500 mg by mouth daily as needed. 12/14/20  Yes [provider]  VYVANSE 50 MG capsule Take 50 mg by mouth every morning. 03/30/21  Yes [provider]  enoxaparin (LOVENOX) 40 MG/0.4ML injection Inject 0.4 mLs (40 mg total) into the skin every 12 (twelve) hours for 28 days. 04/16/21 05/14/21  Johnathan Hausen, MD    Family History Family History  Problem Relation Age of Onset   Cancer Maternal Grandmother        UTERINE   Cancer Paternal Grandmother        breast   Breast cancer Paternal Grandmother    Diabetes Father    Cancer Father        COLON   Heart disease Father    Kidney disease Father        dialysis   Diabetes Sister    Breast cancer Cousin     Social History Social History   Tobacco Use   Smoking status: Never   Smokeless tobacco: Never  Vaping Use   Vaping Use: Never used  Substance Use Topics   Alcohol use: No    Alcohol/week: 0.0 standard drinks   Drug use: No     Allergies   Gadolinium derivatives, Duloxetine, Codeine, and Cymbalta [duloxetine hcl]   Review of Systems Review of Systems  Per HPI Physical Exam Triage Vital Signs ED Triage Vitals  Enc Vitals Group     BP 07/01/21 1835 128/89     Pulse Rate 07/01/21 1835 78     Resp 07/01/21 1835 18     Temp 07/01/21 1835 98.5 F (36.9 C)     Temp Source 07/01/21 1835 Oral     SpO2 07/01/21 1835 98 %     Weight 07/01/21 1836 266 lb (  120.7 kg)     Height 07/01/21 1836 5\' 11"  (1.803 m)     Head Circumference --      Peak Flow --      Pain Score 07/01/21 1836 5     Pain Loc --      Pain Edu? --       Excl. in [place]? --    No data found.  Updated Vital Signs BP 128/89 (BP Location: Left Arm)   Pulse 78   Temp 98.5 F (36.9 C) (Oral)   Resp 18   Ht 5\' 11"  (1.803 m)   Wt 266 lb (120.7 kg)   SpO2 98%   BMI 37.10 kg/m   Visual Acuity Right Eye Distance:   Left Eye Distance:   Bilateral Distance:    Right Eye Near:   Left Eye Near:    Bilateral Near:     Physical Exam Constitutional:      General: She is not in acute distress.    Appearance: Normal appearance. She is not toxic-appearing or diaphoretic.  HENT:     Head: Normocephalic and atraumatic.  Eyes:     Extraocular Movements: Extraocular movements intact.     Conjunctiva/sclera: Conjunctivae normal.  Pulmonary:     Effort: Pulmonary effort is normal.  Musculoskeletal:     Cervical back: Normal.     Thoracic back: Normal.     Lumbar back: Tenderness present. No swelling, edema, deformity or lacerations. Normal range of motion. Negative right straight leg raise test and negative left straight leg raise test.       Back:     Comments: Tenderness to palpation directly over lumbar spine as well as to paraspinal muscles to right spine.  Neurological:     General: No focal deficit present.     Mental Status: She is alert and oriented to person, place, and time. Mental status is at baseline.     Cranial Nerves: Cranial nerves are intact.     Sensory: Sensation is intact.     Motor: Motor function is intact.     Coordination: Coordination is intact.     Gait: Gait is intact.  Psychiatric:        Mood and Affect: Mood normal.        Behavior: Behavior normal.        Thought Content: Thought content normal.        Judgment: Judgment normal.     UC Treatments / Results  Labs (all labs ordered are listed, but only abnormal results are displayed) Labs Reviewed - No data to display  EKG   Radiology DG Lumbar Spine Complete  Result Date: 07/01/2021 CLINICAL DATA:  Fall, low back pain EXAM: LUMBAR SPINE -  COMPLETE 4+ VIEW COMPARISON:  07/01/2021, 11:09 a.m.; correlation is also made with 11/29/2018 CT lumbar spine FINDINGS: Status post posterior fusion L3-S1, with interbody disc spacers. No perihardware lucency or evidence of hardware fracture. There is no evidence of lumbar spine fracture. Alignment is normal. Intervertebral disc spaces at non operative levels are maintained. IMPRESSION: No acute fracture or traumatic listhesis. Electronically Signed   By: Merilyn Baba M.D.   On: 07/01/2021 19:23    Procedures Procedures (including critical care time)  Medications Ordered in UC Medications  ketorolac (TORADOL) 30 MG/ML injection 30 mg (30 mg Intramuscular Given 07/01/21 1936)    Initial Impression / Assessment and Plan / UC Course  I have reviewed the triage vital signs and the nursing notes.  Pertinent  labs & imaging results that were available during my care of the patient were reviewed by me and considered in my medical decision making (see chart for details).     X-ray was negative for any acute bony abnormality.  Suspect contusion to back that is causing pain.  Hardware from previous surgery seems to be in place and consistent with previous lumbar spinal x-rays.  Patient may continue oxycodone as needed.  Toradol shot administered in urgent care today for pain and inflammation.  Patient advised to use ice application.  No red flags seen on exam.Discussed strict return precautions. Patient verbalized understanding and is agreeable with plan.  Final Clinical Impressions(s) / UC Diagnoses   Final diagnoses:  Fall, initial encounter  Acute midline low back pain without sciatica     Discharge Instructions      Your x-ray was negative for any acute bony abnormality or fracture.  Suspect that you have a contusion to your back.  Toradol shot was given in urgent care today.  Please avoid taking any NSAIDs for at least 24 hours following injection.  Also use ice application to affected  area.     ED Prescriptions   None    PDMP not reviewed this encounter.   Odis Luster, FNP 07/01/21 [place], [place], [place] 07/01/21 1955

## 2021-07-01 NOTE — Discharge Instructions (Addendum)
Your x-ray was negative for any acute bony abnormality or fracture.  Suspect that you have a contusion to your back.  Toradol shot was given in urgent care today.  Please avoid taking any NSAIDs for at least 24 hours following injection.  Also use ice application to affected area.

## 2021-07-02 ENCOUNTER — Other Ambulatory Visit: Payer: Self-pay | Admitting: Neurological Surgery

## 2021-07-02 DIAGNOSIS — M5416 Radiculopathy, lumbar region: Secondary | ICD-10-CM

## 2021-07-09 DIAGNOSIS — M47817 Spondylosis without myelopathy or radiculopathy, lumbosacral region: Secondary | ICD-10-CM | POA: Diagnosis not present

## 2021-07-09 DIAGNOSIS — J3081 Allergic rhinitis due to animal (cat) (dog) hair and dander: Secondary | ICD-10-CM | POA: Diagnosis not present

## 2021-07-09 DIAGNOSIS — J301 Allergic rhinitis due to pollen: Secondary | ICD-10-CM | POA: Diagnosis not present

## 2021-07-09 DIAGNOSIS — J3089 Other allergic rhinitis: Secondary | ICD-10-CM | POA: Diagnosis not present

## 2021-07-09 DIAGNOSIS — G894 Chronic pain syndrome: Secondary | ICD-10-CM | POA: Diagnosis not present

## 2021-07-09 DIAGNOSIS — G4700 Insomnia, unspecified: Secondary | ICD-10-CM | POA: Diagnosis not present

## 2021-07-14 DIAGNOSIS — J3081 Allergic rhinitis due to animal (cat) (dog) hair and dander: Secondary | ICD-10-CM | POA: Diagnosis not present

## 2021-07-14 DIAGNOSIS — J3089 Other allergic rhinitis: Secondary | ICD-10-CM | POA: Diagnosis not present

## 2021-07-14 DIAGNOSIS — J301 Allergic rhinitis due to pollen: Secondary | ICD-10-CM | POA: Diagnosis not present

## 2021-07-15 DIAGNOSIS — I8312 Varicose veins of left lower extremity with inflammation: Secondary | ICD-10-CM | POA: Diagnosis not present

## 2021-07-21 ENCOUNTER — Ambulatory Visit
Admission: RE | Admit: 2021-07-21 | Discharge: 2021-07-21 | Disposition: A | Discharge status: Home / Self Care | Payer: Medicare Other | Source: Ambulatory Visit | Attending: Neurological Surgery | Admitting: Neurological Surgery

## 2021-07-21 ENCOUNTER — Other Ambulatory Visit: Payer: Self-pay

## 2021-07-21 DIAGNOSIS — M5116 Intervertebral disc disorders with radiculopathy, lumbar region: Secondary | ICD-10-CM | POA: Diagnosis not present

## 2021-07-21 DIAGNOSIS — J301 Allergic rhinitis due to pollen: Secondary | ICD-10-CM | POA: Diagnosis not present

## 2021-07-21 DIAGNOSIS — J3089 Other allergic rhinitis: Secondary | ICD-10-CM | POA: Diagnosis not present

## 2021-07-21 DIAGNOSIS — M5416 Radiculopathy, lumbar region: Secondary | ICD-10-CM

## 2021-07-21 DIAGNOSIS — M4326 Fusion of spine, lumbar region: Secondary | ICD-10-CM | POA: Diagnosis not present

## 2021-07-21 DIAGNOSIS — R600 Localized edema: Secondary | ICD-10-CM | POA: Diagnosis not present

## 2021-07-21 DIAGNOSIS — Z981 Arthrodesis status: Secondary | ICD-10-CM | POA: Diagnosis not present

## 2021-07-21 DIAGNOSIS — J3081 Allergic rhinitis due to animal (cat) (dog) hair and dander: Secondary | ICD-10-CM | POA: Diagnosis not present

## 2021-07-21 DIAGNOSIS — I8311 Varicose veins of right lower extremity with inflammation: Secondary | ICD-10-CM | POA: Diagnosis not present

## 2021-07-21 MED ORDER — ONDANSETRON HCL 4 MG/2ML IJ SOLN
4.0000 mg | Freq: Once | INTRAMUSCULAR | Status: AC
  Administered 2021-07-21: 4 mg via INTRAVENOUS

## 2021-07-21 MED ORDER — GADOBUTROL 1 MMOL/ML IV SOLN
10.0000 mL | Freq: Once | INTRAVENOUS | Status: AC | PRN
  Administered 2021-07-21: 10 mL via INTRAVENOUS

## 2021-07-24 DIAGNOSIS — Z9889 Other specified postprocedural states: Secondary | ICD-10-CM | POA: Insufficient documentation

## 2021-07-24 DIAGNOSIS — I10 Essential (primary) hypertension: Secondary | ICD-10-CM | POA: Diagnosis not present

## 2021-07-24 DIAGNOSIS — Z1231 Encounter for screening mammogram for malignant neoplasm of breast: Secondary | ICD-10-CM | POA: Diagnosis not present

## 2021-07-28 DIAGNOSIS — J3081 Allergic rhinitis due to animal (cat) (dog) hair and dander: Secondary | ICD-10-CM | POA: Diagnosis not present

## 2021-07-28 DIAGNOSIS — J3089 Other allergic rhinitis: Secondary | ICD-10-CM | POA: Diagnosis not present

## 2021-07-28 DIAGNOSIS — J301 Allergic rhinitis due to pollen: Secondary | ICD-10-CM | POA: Diagnosis not present

## 2021-07-29 DIAGNOSIS — I872 Venous insufficiency (chronic) (peripheral): Secondary | ICD-10-CM | POA: Diagnosis not present

## 2021-07-29 DIAGNOSIS — I83812 Varicose veins of left lower extremities with pain: Secondary | ICD-10-CM | POA: Diagnosis not present

## 2021-07-29 DIAGNOSIS — I83892 Varicose veins of left lower extremities with other complications: Secondary | ICD-10-CM | POA: Diagnosis not present

## 2021-07-29 DIAGNOSIS — M7989 Other specified soft tissue disorders: Secondary | ICD-10-CM | POA: Diagnosis not present

## 2021-08-03 DIAGNOSIS — M1612 Unilateral primary osteoarthritis, left hip: Secondary | ICD-10-CM | POA: Diagnosis not present

## 2021-08-04 DIAGNOSIS — I83811 Varicose veins of right lower extremities with pain: Secondary | ICD-10-CM | POA: Diagnosis not present

## 2021-08-04 DIAGNOSIS — J301 Allergic rhinitis due to pollen: Secondary | ICD-10-CM | POA: Diagnosis not present

## 2021-08-04 DIAGNOSIS — I83891 Varicose veins of right lower extremities with other complications: Secondary | ICD-10-CM | POA: Diagnosis not present

## 2021-08-04 DIAGNOSIS — Z09 Encounter for follow-up examination after completed treatment for conditions other than malignant neoplasm: Secondary | ICD-10-CM | POA: Diagnosis not present

## 2021-08-04 DIAGNOSIS — I872 Venous insufficiency (chronic) (peripheral): Secondary | ICD-10-CM | POA: Diagnosis not present

## 2021-08-04 DIAGNOSIS — J3081 Allergic rhinitis due to animal (cat) (dog) hair and dander: Secondary | ICD-10-CM | POA: Diagnosis not present

## 2021-08-04 DIAGNOSIS — J3089 Other allergic rhinitis: Secondary | ICD-10-CM | POA: Diagnosis not present

## 2021-08-06 DIAGNOSIS — M5116 Intervertebral disc disorders with radiculopathy, lumbar region: Secondary | ICD-10-CM | POA: Diagnosis not present

## 2021-08-10 DIAGNOSIS — M5416 Radiculopathy, lumbar region: Secondary | ICD-10-CM | POA: Diagnosis not present

## 2021-08-10 DIAGNOSIS — Z96641 Presence of right artificial hip joint: Secondary | ICD-10-CM | POA: Diagnosis not present

## 2021-08-10 DIAGNOSIS — M25551 Pain in right hip: Secondary | ICD-10-CM | POA: Diagnosis not present

## 2021-08-10 DIAGNOSIS — M6281 Muscle weakness (generalized): Secondary | ICD-10-CM | POA: Diagnosis not present

## 2021-08-11 DIAGNOSIS — J3081 Allergic rhinitis due to animal (cat) (dog) hair and dander: Secondary | ICD-10-CM | POA: Diagnosis not present

## 2021-08-11 DIAGNOSIS — J301 Allergic rhinitis due to pollen: Secondary | ICD-10-CM | POA: Diagnosis not present

## 2021-08-11 DIAGNOSIS — J3089 Other allergic rhinitis: Secondary | ICD-10-CM | POA: Diagnosis not present

## 2021-08-12 ENCOUNTER — Other Ambulatory Visit: Payer: Self-pay

## 2021-08-12 ENCOUNTER — Encounter: Payer: Medicare Other | Attending: Surgery | Admitting: Skilled Nursing Facility1

## 2021-08-12 DIAGNOSIS — E669 Obesity, unspecified: Secondary | ICD-10-CM | POA: Insufficient documentation

## 2021-08-12 NOTE — Progress Notes (Signed)
Bariatric Nutrition Follow-Up Visit Medical Nutrition Therapy   NUTRITION ASSESSMENT    Anthropometrics  Start weight at [facility]: 298 lbs (date: 07/08/2020) Today's weight: 258.2 pounds  Clinical  Medical Hx: HTN, PCOS Medications: see list Labs:  Notable Signs/Symptoms: chronic pain  Body Composition Scale 04/29/2021 06/04/2021 08/12/2021  Current Body Weight 281.3 274.6 258.2  Total Body Fat % 44.2 43.6 42  Visceral Fat 14 13 12   Fat-Free Mass % 55.7 56.3 57.9   Total Body Water % 42.3 42.6 43.4  Muscle-Mass lbs 37.3 37.3 37.1  BMI 39.1 38.1 35.8  Body Fat Displacement            Torso  lbs 77.2 74.2 67.2         Left Leg  lbs 15.4 14.8 13.4         Right Leg  lbs 15.4 14.8 13.4         Left Arm  lbs 7.7 7.4 6.7         Right Arm   lbs 7.7 7.4 6.7     Lifestyle & Dietary Hx  Pt states she is hopeful to get off her blood pressure medicine.  Pt states she Starving/hungry 30 minutes later after eating to satisfaction. Pt states she has a thing with gas and belching.  Pt states she was eating out frequently but has tried to cut back more recently.    Estimated daily fluid intake: 64+ oz Estimated daily protein intake: 70+ g Supplements: multi and calcium  Current average weekly physical activity[facility]   24-Hr Dietary Recall First Meal: 2 boiled eggs + bacon and sometimes spinach + activia  Snack:  cheese Second Meal: healthy choice meal or chicken salad or tuna in a pouch or chic fila fried chicken and pickles  Snack: nuts Third Meal: (2) collard greens + cream cheese + pork + egg role  Snack: peach cobbler egg role Beverages: water, lemonade   Post-Op Goals/ Signs/ Symptoms Using straws: no Drinking while eating: no Chewing/swallowing difficulties: no Changes in vision: no Changes to mood/headaches: no Hair loss/changes to skin/nails: no Difficulty focusing/concentrating: no Sweating: no Dizziness/lightheadedness:  no Palpitations: no  Carbonated/caffeinated beverages: no N/V/D/C/Gas: no Abdominal pain: no Dumping syndrome: no    NUTRITION DIAGNOSIS  Overweight/obesity ([place]-3.3) related to past poor dietary habits and physical inactivity as evidenced by completed bariatric surgery and following dietary guidelines for continued weight loss and healthy nutrition status.     NUTRITION INTERVENTION Nutrition counseling (C-1) and education (E-2) to facilitate bariatric surgery goals, including: Diet advancement to the next phase (phase 5) now including starchy vegetables  The importance of consuming adequate calories as well as certain nutrients daily due to the body's need for essential vitamins, minerals, and fats The importance of daily physical activity and to reach a goal of at least 150 minutes of moderate to vigorous physical activity weekly (or as directed by their physician) due to benefits such as increased musculature and improved lab values The importance of intuitive eating specifically learning hunger-satiety cues and understanding the importance of learning a new body: The importance of mindful eating to avoid grazing behaviors  Educated pt on emotional eating, how to identify, and strategies to reduce the behavior    Handouts Provided Include  Phase 5 handout folder  Learning Style & Readiness for Change Teaching method utilized: Visual & Auditory  Demonstrated degree of understanding via: Teach Back  Readiness Level: action Barriers to learning/adherence  to lifestyle change: non identified   RD's Notes for Next Visit Assess adherence to pt chosen goals    MONITORING & EVALUATION Dietary intake, weekly physical activity, body weight Next Steps Patient is to follow-up in February

## 2021-08-17 ENCOUNTER — Ambulatory Visit: Payer: Medicare Other

## 2021-08-17 ENCOUNTER — Ambulatory Visit (INDEPENDENT_AMBULATORY_CARE_PROVIDER_SITE_OTHER): Payer: Medicare Other | Admitting: Podiatry

## 2021-08-17 ENCOUNTER — Other Ambulatory Visit: Payer: Self-pay

## 2021-08-17 DIAGNOSIS — L600 Ingrowing nail: Secondary | ICD-10-CM | POA: Diagnosis not present

## 2021-08-17 DIAGNOSIS — T8484XA Pain due to internal orthopedic prosthetic devices, implants and grafts, initial encounter: Secondary | ICD-10-CM

## 2021-08-17 DIAGNOSIS — M722 Plantar fascial fibromatosis: Secondary | ICD-10-CM

## 2021-08-17 DIAGNOSIS — M2141 Flat foot [pes planus] (acquired), right foot: Secondary | ICD-10-CM

## 2021-08-17 DIAGNOSIS — M2140 Flat foot [pes planus] (acquired), unspecified foot: Secondary | ICD-10-CM

## 2021-08-17 DIAGNOSIS — M2142 Flat foot [pes planus] (acquired), left foot: Secondary | ICD-10-CM

## 2021-08-17 DIAGNOSIS — M6281 Muscle weakness (generalized): Secondary | ICD-10-CM | POA: Diagnosis not present

## 2021-08-17 DIAGNOSIS — Z96641 Presence of right artificial hip joint: Secondary | ICD-10-CM | POA: Diagnosis not present

## 2021-08-17 DIAGNOSIS — M25551 Pain in right hip: Secondary | ICD-10-CM | POA: Diagnosis not present

## 2021-08-17 DIAGNOSIS — Z9889 Other specified postprocedural states: Secondary | ICD-10-CM

## 2021-08-17 NOTE — Progress Notes (Signed)
   HPI: 45 y.o. female presenting today for possibility of ingrown toenails to the bilateral feet.  She says that she has been to the pedicure but she feels some pinching in her feet.  She would like to have it evaluated  Patient also has a history of subtalar joint fusion to the bilateral feet.  This was several years prior.  She has been experiencing stiffness with pain on her heels.  She feels that she does not have a cushion anymore on her feet.  She presents for further treatment and evaluation  Past Medical History:  Diagnosis Date   Arthritis    knees, back   Carpal tunnel syndrome of right wrist 26/3335   Complication of anesthesia    Depression    History of pneumonia    Hypertension    Lumbar spondylosis    PCOS (polycystic ovarian syndrome)    Pneumonia    2016   PONV (postoperative nausea and vomiting)      Physical Exam: General: The patient is alert and oriented x3 in no acute distress.  Dermatology: Skin is warm, dry and supple bilateral lower extremities. Negative for open lesions or macerations.  There is some heavy callus with subungual debris to the medial and lateral nail folds of the bilateral great toes as well as the right third digit.  Vascular: Palpable pedal pulses bilaterally. No edema or erythema noted. Capillary refill within normal limits.  Neurological: Epicritic and protective threshold grossly intact bilaterally.   Musculoskeletal Exam: Range of motion within normal limits to all pedal and ankle joints bilateral. Muscle strength 5/5 in all groups bilateral.  There is some pain on palpation throughout the plantar heel with some medial longitudinal arch collapse with weightbearing.  Assessment: 1.  Ingrowing toenail/subungual debris bilateral great toes and right third digit 2.  Pes planus deformity 3.  Plantar fasciitis bilateral   Plan of Care:  1. Patient evaluated.  2.  Light debridement of the subungual debris and callus tissue was performed  and the patient felt relief.  We will simply observe for the next few weeks to see if there is improvement.  If there is no improvement we will proceed with partial nail matricectomy's 3.  Given the patient's history of flatfoot and history of subtalar joint arthrodesis with plantar fasciitis the patient will be a good candidate for custom molded orthotics.  Medically necessary.  Today the patient was molded for custom molded orthotics 4.  Return to clinic in 2 weeks      Edrick Kins, DPM [facility]  Dr. Edrick Kins, DPM    [address], [place] [address]                Office [phone]  Fax [phone]

## 2021-08-17 NOTE — Progress Notes (Signed)
SITUATION Reason for Consult: Evaluation for Bilateral Custom Foot Orthoses Patient / Caregiver Report: Patient has pes planus, plantar fasciitis, and bilateral subtalar fusions and needs support in ambulation  OBJECTIVE DATA: Patient History / Diagnosis: Plantar fasciitis, pes planus, subtalar fusion bilateral Current or Previous Devices: None and no history  Foot Examination: Skin presentation:   Intact Ulcers & Callousing:   None and no history Toe / Foot Deformities:  Pes planus Weight Bearing Presentation:  Planus Sensation:    Intact  ORTHOTIC RECOMMENDATION Recommended Device: 1x pair of custom functional foot orthoses  GOALS OF ORTHOSES - Reduce Pain - Prevent Foot Deformity - Prevent Progression of Further Foot Deformity - Relieve Pressure - Improve the Overall Biomechanical Function of the Foot and Lower Extremity.  ACTIONS PERFORMED Patient was casted for Foot Orthoses via crush box. Procedure was explained and patient tolerated procedure well. All questions were answered and concerns addressed.  PLAN Potential out of pocket cost was communicated to patient. Casts are to be sent to [facility] for fabrication. Patient is to be called for fitting when devices are ready.

## 2021-08-18 DIAGNOSIS — J3089 Other allergic rhinitis: Secondary | ICD-10-CM | POA: Diagnosis not present

## 2021-08-18 DIAGNOSIS — J301 Allergic rhinitis due to pollen: Secondary | ICD-10-CM | POA: Diagnosis not present

## 2021-08-18 DIAGNOSIS — J3081 Allergic rhinitis due to animal (cat) (dog) hair and dander: Secondary | ICD-10-CM | POA: Diagnosis not present

## 2021-08-19 DIAGNOSIS — M25551 Pain in right hip: Secondary | ICD-10-CM | POA: Diagnosis not present

## 2021-08-19 DIAGNOSIS — Z23 Encounter for immunization: Secondary | ICD-10-CM | POA: Diagnosis not present

## 2021-08-19 DIAGNOSIS — D509 Iron deficiency anemia, unspecified: Secondary | ICD-10-CM | POA: Diagnosis not present

## 2021-08-19 DIAGNOSIS — M6281 Muscle weakness (generalized): Secondary | ICD-10-CM | POA: Diagnosis not present

## 2021-08-19 DIAGNOSIS — E7800 Pure hypercholesterolemia, unspecified: Secondary | ICD-10-CM | POA: Diagnosis not present

## 2021-08-19 DIAGNOSIS — I10 Essential (primary) hypertension: Secondary | ICD-10-CM | POA: Diagnosis not present

## 2021-08-19 DIAGNOSIS — R600 Localized edema: Secondary | ICD-10-CM | POA: Diagnosis not present

## 2021-08-19 DIAGNOSIS — Z96641 Presence of right artificial hip joint: Secondary | ICD-10-CM | POA: Diagnosis not present

## 2021-08-24 DIAGNOSIS — Z96641 Presence of right artificial hip joint: Secondary | ICD-10-CM | POA: Diagnosis not present

## 2021-08-24 DIAGNOSIS — M25551 Pain in right hip: Secondary | ICD-10-CM | POA: Diagnosis not present

## 2021-08-24 DIAGNOSIS — M6281 Muscle weakness (generalized): Secondary | ICD-10-CM | POA: Diagnosis not present

## 2021-08-24 DIAGNOSIS — M25562 Pain in left knee: Secondary | ICD-10-CM | POA: Diagnosis not present

## 2021-08-24 DIAGNOSIS — M25561 Pain in right knee: Secondary | ICD-10-CM | POA: Diagnosis not present

## 2021-08-28 DIAGNOSIS — J3089 Other allergic rhinitis: Secondary | ICD-10-CM | POA: Diagnosis not present

## 2021-08-28 DIAGNOSIS — J301 Allergic rhinitis due to pollen: Secondary | ICD-10-CM | POA: Diagnosis not present

## 2021-08-28 DIAGNOSIS — J3081 Allergic rhinitis due to animal (cat) (dog) hair and dander: Secondary | ICD-10-CM | POA: Diagnosis not present

## 2021-08-31 ENCOUNTER — Ambulatory Visit: Payer: Medicare Other | Admitting: Podiatry

## 2021-08-31 DIAGNOSIS — M25551 Pain in right hip: Secondary | ICD-10-CM | POA: Diagnosis not present

## 2021-08-31 DIAGNOSIS — M6281 Muscle weakness (generalized): Secondary | ICD-10-CM | POA: Diagnosis not present

## 2021-08-31 DIAGNOSIS — Z96641 Presence of right artificial hip joint: Secondary | ICD-10-CM | POA: Diagnosis not present

## 2021-09-01 DIAGNOSIS — J301 Allergic rhinitis due to pollen: Secondary | ICD-10-CM | POA: Diagnosis not present

## 2021-09-01 DIAGNOSIS — J3081 Allergic rhinitis due to animal (cat) (dog) hair and dander: Secondary | ICD-10-CM | POA: Diagnosis not present

## 2021-09-01 DIAGNOSIS — J3089 Other allergic rhinitis: Secondary | ICD-10-CM | POA: Diagnosis not present

## 2021-09-02 DIAGNOSIS — M6281 Muscle weakness (generalized): Secondary | ICD-10-CM | POA: Diagnosis not present

## 2021-09-02 DIAGNOSIS — M79605 Pain in left leg: Secondary | ICD-10-CM | POA: Diagnosis not present

## 2021-09-02 DIAGNOSIS — Z96641 Presence of right artificial hip joint: Secondary | ICD-10-CM | POA: Diagnosis not present

## 2021-09-02 DIAGNOSIS — M25551 Pain in right hip: Secondary | ICD-10-CM | POA: Diagnosis not present

## 2021-09-02 DIAGNOSIS — I83892 Varicose veins of left lower extremities with other complications: Secondary | ICD-10-CM | POA: Diagnosis not present

## 2021-09-02 DIAGNOSIS — I87392 Chronic venous hypertension (idiopathic) with other complications of left lower extremity: Secondary | ICD-10-CM | POA: Diagnosis not present

## 2021-09-03 DIAGNOSIS — G4700 Insomnia, unspecified: Secondary | ICD-10-CM | POA: Diagnosis not present

## 2021-09-03 DIAGNOSIS — M47817 Spondylosis without myelopathy or radiculopathy, lumbosacral region: Secondary | ICD-10-CM | POA: Diagnosis not present

## 2021-09-03 DIAGNOSIS — G894 Chronic pain syndrome: Secondary | ICD-10-CM | POA: Diagnosis not present

## 2021-09-04 DIAGNOSIS — M6281 Muscle weakness (generalized): Secondary | ICD-10-CM | POA: Diagnosis not present

## 2021-09-04 DIAGNOSIS — M25551 Pain in right hip: Secondary | ICD-10-CM | POA: Diagnosis not present

## 2021-09-07 ENCOUNTER — Other Ambulatory Visit: Payer: Self-pay

## 2021-09-07 ENCOUNTER — Ambulatory Visit (INDEPENDENT_AMBULATORY_CARE_PROVIDER_SITE_OTHER): Payer: Medicare Other | Admitting: Podiatry

## 2021-09-07 DIAGNOSIS — M25551 Pain in right hip: Secondary | ICD-10-CM | POA: Diagnosis not present

## 2021-09-07 DIAGNOSIS — L600 Ingrowing nail: Secondary | ICD-10-CM | POA: Diagnosis not present

## 2021-09-07 DIAGNOSIS — Z96641 Presence of right artificial hip joint: Secondary | ICD-10-CM | POA: Diagnosis not present

## 2021-09-07 DIAGNOSIS — M6281 Muscle weakness (generalized): Secondary | ICD-10-CM | POA: Diagnosis not present

## 2021-09-08 DIAGNOSIS — J3089 Other allergic rhinitis: Secondary | ICD-10-CM | POA: Diagnosis not present

## 2021-09-08 DIAGNOSIS — J301 Allergic rhinitis due to pollen: Secondary | ICD-10-CM | POA: Diagnosis not present

## 2021-09-08 DIAGNOSIS — I87322 Chronic venous hypertension (idiopathic) with inflammation of left lower extremity: Secondary | ICD-10-CM | POA: Diagnosis not present

## 2021-09-08 DIAGNOSIS — Z86718 Personal history of other venous thrombosis and embolism: Secondary | ICD-10-CM | POA: Diagnosis not present

## 2021-09-08 DIAGNOSIS — J3081 Allergic rhinitis due to animal (cat) (dog) hair and dander: Secondary | ICD-10-CM | POA: Diagnosis not present

## 2021-09-15 ENCOUNTER — Other Ambulatory Visit: Payer: Self-pay

## 2021-09-15 ENCOUNTER — Ambulatory Visit: Payer: Medicare Other

## 2021-09-15 DIAGNOSIS — M722 Plantar fascial fibromatosis: Secondary | ICD-10-CM

## 2021-09-15 DIAGNOSIS — M2140 Flat foot [pes planus] (acquired), unspecified foot: Secondary | ICD-10-CM

## 2021-09-15 NOTE — Progress Notes (Signed)
SITUATION: Reason for Visit: Fitting and Delivery of Custom Fabricated Foot Orthoses Patient Report: Patient reports comfort and is satisfied with device.  OBJECTIVE DATA: Patient History / Diagnosis:     ICD-10-CM   1. Plantar fasciitis, bilateral  M72.2     2. Pes planus, unspecified laterality  M21.40       Provided Device:  Custom functional foot orthotics  GOAL OF ORTHOSIS - Improve gait - Decrease energy expenditure - Improve Balance - Provide Triplanar stability of foot complex - Facilitate motion  ACTIONS PERFORMED Patient was fit with foot orthotics trimmed to shoe last. Patient tolerated fittign procedure. Device was modified as follows to better fit patient: - Toe plate was trimmed to shoe last  Patient was provided with verbal and written instruction and demonstration regarding donning, doffing, wear, care, proper fit, function, purpose, cleaning, and use of the orthosis and in all related precautions and risks and benefits regarding the orthosis.  Patient was also provided with verbal instruction regarding how to report any failures or malfunctions of the orthosis and necessary follow up care. Patient was also instructed to contact our office regarding any change in status that may affect the function of the orthosis.  Patient demonstrated independence with proper donning, doffing, and fit and verbalized understanding of all instructions.  PLAN: Patient is to follow up in one week or as necessary (PRN). All questions were answered and concerns addressed. Plan of care was discussed with and agreed upon by the patient.

## 2021-09-16 DIAGNOSIS — J3089 Other allergic rhinitis: Secondary | ICD-10-CM | POA: Diagnosis not present

## 2021-09-16 DIAGNOSIS — R601 Generalized edema: Secondary | ICD-10-CM | POA: Diagnosis not present

## 2021-09-16 DIAGNOSIS — J301 Allergic rhinitis due to pollen: Secondary | ICD-10-CM | POA: Diagnosis not present

## 2021-09-16 DIAGNOSIS — M25551 Pain in right hip: Secondary | ICD-10-CM | POA: Diagnosis not present

## 2021-09-16 DIAGNOSIS — I87393 Chronic venous hypertension (idiopathic) with other complications of bilateral lower extremity: Secondary | ICD-10-CM | POA: Diagnosis not present

## 2021-09-16 DIAGNOSIS — I83891 Varicose veins of right lower extremities with other complications: Secondary | ICD-10-CM | POA: Diagnosis not present

## 2021-09-16 DIAGNOSIS — J3081 Allergic rhinitis due to animal (cat) (dog) hair and dander: Secondary | ICD-10-CM | POA: Diagnosis not present

## 2021-09-18 DIAGNOSIS — I10 Essential (primary) hypertension: Secondary | ICD-10-CM | POA: Diagnosis not present

## 2021-09-22 NOTE — Progress Notes (Signed)
HPI: 46 y.o. female presenting today for follow-up evaluation of ingrown toenails to the bilateral feet.  Patient states that she has been suffering from ingrown toenails from the bilateral great toes and the right third digit.  Last visit on 08/17/2021 light debridement of the subungual and nail plate was performed to see if she got any relief with conservative treatment.  If there was no improvement we are going to proceed with a partial nail matricectomy.  Last visit the patient was also molded for custom molded orthotics.  She presents for follow-up treatment evaluation  Past Medical History:  Diagnosis Date   Arthritis    knees, back   Carpal tunnel syndrome of right wrist 02/3015   Complication of anesthesia    Depression    History of pneumonia    Hypertension    Lumbar spondylosis    PCOS (polycystic ovarian syndrome)    Pneumonia    2016   PONV (postoperative nausea and vomiting)     Past Surgical History:  Procedure Laterality Date   ABDOMINAL EXPOSURE N/A 02/17/2018   Procedure: ABDOMINAL EXPOSURE;  Surgeon: Rosetta Posner, MD;  [facility];  Service: Vascular;  Laterality: N/A;   ANKLE SURGERY     x 2   ANTERIOR LAT LUMBAR FUSION N/A 02/17/2018   Procedure: Lumbar three-four Lumbar four-five  Anterolateral lumbar interbody fusion;  Surgeon: Kristeen Miss, MD;  Location: [facility];  Service: Neurosurgery;  Laterality: N/A;   ANTERIOR LUMBAR FUSION N/A 02/17/2018   Procedure: Lumbar five-Sacral one Anterior lumbar interbody fusion;  Surgeon: Kristeen Miss, MD;  Location: [place];  Service: Neurosurgery;  Laterality: N/A;   APPLICATION OF ROBOTIC ASSISTANCE FOR SPINAL PROCEDURE N/A 02/17/2018   Procedure: APPLICATION OF ROBOTIC ASSISTANCE FOR SPINAL PROCEDURE;  Surgeon: Kristeen Miss, MD;  Location: [place];  Service: Neurosurgery;  Laterality: N/A;   APPLICATION OF ROBOTIC ASSISTANCE FOR SPINAL PROCEDURE N/A 06/09/2018   Procedure: APPLICATION OF ROBOTIC ASSISTANCE FOR SPINAL  PROCEDURE;  Surgeon: Kristeen Miss, MD;  Location: [place];  Service: Neurosurgery;  Laterality: N/A;   BACK SURGERY     2017  discectomy   CARPAL TUNNEL RELEASE Left 07/04/2014   Procedure: LEFT CARPAL TUNNEL RELEASE;  Surgeon: Ninetta Lights, MD;  Location: [place];  Service: Orthopedics;  Laterality: Left;   CARPAL TUNNEL RELEASE Right 08/08/2014   Procedure: RIGHT CARPAL TUNNEL RELEASE;  Surgeon: Ninetta Lights, MD;  Location: [place];  Service: Orthopedics;  Laterality: Right;   DILATION AND CURETTAGE OF UTERUS     DILATION AND EVACUATION  04/02/2011   Procedure: DILATATION AND EVACUATION (D&E);  Surgeon: Luz Lex, MD;  Location: [facility];  Service: Gynecology;  Laterality: N/A;  dvt left mid thigh   DORSAL COMPARTMENT RELEASE Left 07/04/2014   Procedure: LEFT DEQUERVAINS;  Surgeon: Ninetta Lights, MD;  Location: [place];  Service: Orthopedics;  Laterality: Left;   ENDOSCOPIC PLANTAR FASCIOTOMY Left 02/01/2002   HARDWARE REMOVAL Right 06/09/2018   Procedure: Repositioning of Right Lumbar three Right lumbar  four pedicle screw with MAZOR;  Surgeon: Kristeen Miss, MD;  Location: [place];  Service: Neurosurgery;  Laterality: Right;   HYSTEROSCOPY WITH D & C  07/17/2010   with exc. endometrial polyps   KNEE ARTHROSCOPY Right 03/07/2003; 01/14/2005; 08/31/2006   KNEE ARTHROSCOPY Left 03/21/2003; 11/26/2004   LUMBAR PERCUTANEOUS PEDICLE SCREW 3 LEVEL N/A 02/17/2018   Procedure: LUMBAR PERCUTANEOUS PEDICLE SCREW PLACEMENT LUMBAR THREE-SACRAL ONE;  Surgeon: Kristeen Miss, MD;  Location: [place];  Service: Neurosurgery;  Laterality: N/A;   SHOULDER ARTHROSCOPY Right    TOE SURGERY Left 01/10/2003   claw toe correction 2nd, 3rd, 4th toes   TOTAL HIP ARTHROPLASTY Right 04/21/2016   TOTAL HIP ARTHROPLASTY Right 04/21/2016   Procedure: TOTAL HIP ARTHROPLASTY ANTERIOR APPROACH;  Surgeon: Ninetta Lights, MD;  Location: [place];  Service: Orthopedics;   Laterality: Right;   TOTAL KNEE ARTHROPLASTY Left 01/21/2010   TOTAL KNEE ARTHROPLASTY Right 08/26/2008   UPPER GI ENDOSCOPY N/A 04/14/2021   Procedure: UPPER GI ENDOSCOPY;  Surgeon: Johnathan Hausen, MD;  Location: [facility];  Service: General;  Laterality: N/A;    Allergies  Allergen Reactions   Gadolinium Derivatives Nausea And Vomiting and Other (See Comments)    Pt describes this happens every time she gets gado even with slow injection   Duloxetine     Other reaction(s): insomia   Codeine Itching, Nausea Only and Other (See Comments)    Hydrocodone does not affect pt   Cymbalta [Duloxetine Hcl] Other (See Comments)    Altered mental status     Physical Exam: General: The patient is alert and oriented x3 in no acute distress.  Dermatology: Skin is warm, dry and supple bilateral lower extremities. Negative for open lesions or macerations.  Patient continues to experience some symptomatic ingrown toenails to the bilateral great toes and right third digit.  She only got minimal to moderate relief with the conservative debridement of the nail  Vascular: Palpable pedal pulses bilaterally. Capillary refill within normal limits.  Negative for any significant edema or erythema  Neurological: Light touch and protective threshold grossly intact  Musculoskeletal Exam: History of flatfoot and subtalar joint arthrodesis with plantar fasciitis bilateral  Assessment: 1.  Ingrown toenail bilateral great toes and right third digit   Plan of Care:  1. Patient evaluated. 2.  The patient does not want to have the ingrown toenail procedure performed since the holidays are coming up.  Continue conservative treatment in the meantime including wide fitting shoes that do not constrict the toebox area 3.  Custom molded orthotics are still pending 4.  Return to clinic as needed if she would like to proceed with the ingrown toenail procedures      Edrick Kins, DPM [facility]  Dr.  Edrick Kins, DPM    [address], [place] [address]                Office [phone]  Fax [phone]

## 2021-09-23 DIAGNOSIS — J3081 Allergic rhinitis due to animal (cat) (dog) hair and dander: Secondary | ICD-10-CM | POA: Diagnosis not present

## 2021-09-23 DIAGNOSIS — J3089 Other allergic rhinitis: Secondary | ICD-10-CM | POA: Diagnosis not present

## 2021-09-23 DIAGNOSIS — J301 Allergic rhinitis due to pollen: Secondary | ICD-10-CM | POA: Diagnosis not present

## 2021-09-28 DIAGNOSIS — J3081 Allergic rhinitis due to animal (cat) (dog) hair and dander: Secondary | ICD-10-CM | POA: Diagnosis not present

## 2021-09-28 DIAGNOSIS — J3089 Other allergic rhinitis: Secondary | ICD-10-CM | POA: Diagnosis not present

## 2021-09-28 DIAGNOSIS — J301 Allergic rhinitis due to pollen: Secondary | ICD-10-CM | POA: Diagnosis not present

## 2021-09-30 ENCOUNTER — Other Ambulatory Visit: Payer: Self-pay

## 2021-09-30 ENCOUNTER — Ambulatory Visit (INDEPENDENT_AMBULATORY_CARE_PROVIDER_SITE_OTHER): Payer: Medicare Other | Admitting: Podiatry

## 2021-09-30 DIAGNOSIS — Z79899 Other long term (current) drug therapy: Secondary | ICD-10-CM | POA: Diagnosis not present

## 2021-09-30 DIAGNOSIS — L600 Ingrowing nail: Secondary | ICD-10-CM

## 2021-09-30 NOTE — Progress Notes (Signed)
HPI: 46 y.o. female presenting today for follow-up evaluation of an ingrown toenail to the medial border of the left great toe as well as the lateral border of the right third toe.  Last visit we were about to do the partial nail matricectomy however the patient stated that she did not want to have the procedure done during the holidays.  She says that it was tender after it was debrided for a few weeks but currently is asymptomatic.  She is doing very well.  No new complaints at this time  Past Medical History:  Diagnosis Date   Arthritis    knees, back   Carpal tunnel syndrome of right wrist 37/6283   Complication of anesthesia    Depression    History of pneumonia    Hypertension    Lumbar spondylosis    PCOS (polycystic ovarian syndrome)    Pneumonia    2016   PONV (postoperative nausea and vomiting)     Past Surgical History:  Procedure Laterality Date   ABDOMINAL EXPOSURE N/A 02/17/2018   Procedure: ABDOMINAL EXPOSURE;  Surgeon: Rosetta Posner, MD;  [facility];  Service: Vascular;  Laterality: N/A;   ANKLE SURGERY     x 2   ANTERIOR LAT LUMBAR FUSION N/A 02/17/2018   Procedure: Lumbar three-four Lumbar four-five  Anterolateral lumbar interbody fusion;  Surgeon: Kristeen Miss, MD;  Location: [place];  Service: Neurosurgery;  Laterality: N/A;   ANTERIOR LUMBAR FUSION N/A 02/17/2018   Procedure: Lumbar five-Sacral one Anterior lumbar interbody fusion;  Surgeon: Kristeen Miss, MD;  Location: [place];  Service: Neurosurgery;  Laterality: N/A;   APPLICATION OF ROBOTIC ASSISTANCE FOR SPINAL PROCEDURE N/A 02/17/2018   Procedure: APPLICATION OF ROBOTIC ASSISTANCE FOR SPINAL PROCEDURE;  Surgeon: Kristeen Miss, MD;  Location: [place];  Service: Neurosurgery;  Laterality: N/A;   APPLICATION OF ROBOTIC ASSISTANCE FOR SPINAL PROCEDURE N/A 06/09/2018   Procedure: APPLICATION OF ROBOTIC ASSISTANCE FOR SPINAL PROCEDURE;  Surgeon: Kristeen Miss, MD;  Location: [place];  Service: Neurosurgery;   Laterality: N/A;   BACK SURGERY     2017  discectomy   CARPAL TUNNEL RELEASE Left 07/04/2014   Procedure: LEFT CARPAL TUNNEL RELEASE;  Surgeon: Ninetta Lights, MD;  Location: [place];  Service: Orthopedics;  Laterality: Left;   CARPAL TUNNEL RELEASE Right 08/08/2014   Procedure: RIGHT CARPAL TUNNEL RELEASE;  Surgeon: Ninetta Lights, MD;  Location: [place];  Service: Orthopedics;  Laterality: Right;   DILATION AND CURETTAGE OF UTERUS     DILATION AND EVACUATION  04/02/2011   Procedure: DILATATION AND EVACUATION (D&E);  Surgeon: Luz Lex, MD;  Location: [facility];  Service: Gynecology;  Laterality: N/A;  dvt left mid thigh   DORSAL COMPARTMENT RELEASE Left 07/04/2014   Procedure: LEFT DEQUERVAINS;  Surgeon: Ninetta Lights, MD;  Location: [place];  Service: Orthopedics;  Laterality: Left;   ENDOSCOPIC PLANTAR FASCIOTOMY Left 02/01/2002   HARDWARE REMOVAL Right 06/09/2018   Procedure: Repositioning of Right Lumbar three Right lumbar  four pedicle screw with MAZOR;  Surgeon: Kristeen Miss, MD;  Location: [place];  Service: Neurosurgery;  Laterality: Right;   HYSTEROSCOPY WITH D & C  07/17/2010   with exc. endometrial polyps   KNEE ARTHROSCOPY Right 03/07/2003; 01/14/2005; 08/31/2006   KNEE ARTHROSCOPY Left 03/21/2003; 11/26/2004   LUMBAR PERCUTANEOUS PEDICLE SCREW 3 LEVEL N/A 02/17/2018   Procedure: LUMBAR PERCUTANEOUS PEDICLE SCREW PLACEMENT LUMBAR THREE-SACRAL ONE;  Surgeon: Ellene Route,  Mallie Mussel, MD;  Location: [place];  Service: Neurosurgery;  Laterality: N/A;   SHOULDER ARTHROSCOPY Right    TOE SURGERY Left 01/10/2003   claw toe correction 2nd, 3rd, 4th toes   TOTAL HIP ARTHROPLASTY Right 04/21/2016   TOTAL HIP ARTHROPLASTY Right 04/21/2016   Procedure: TOTAL HIP ARTHROPLASTY ANTERIOR APPROACH;  Surgeon: Ninetta Lights, MD;  Location: [place];  Service: Orthopedics;  Laterality: Right;   TOTAL KNEE ARTHROPLASTY Left 01/21/2010   TOTAL KNEE ARTHROPLASTY Right  08/26/2008   UPPER GI ENDOSCOPY N/A 04/14/2021   Procedure: UPPER GI ENDOSCOPY;  Surgeon: Johnathan Hausen, MD;  [facility];  Service: General;  Laterality: N/A;    Allergies  Allergen Reactions   Gadolinium Derivatives Nausea And Vomiting and Other (See Comments)    Pt describes this happens every time she gets gado even with slow injection   Duloxetine     Other reaction(s): insomia   Codeine Itching, Nausea Only and Other (See Comments)    Hydrocodone does not affect pt   Cymbalta [Duloxetine Hcl] Other (See Comments)    Altered mental status     Physical Exam: General: The patient is alert and oriented x3 in no acute distress.  Dermatology: Skin is warm, dry and supple bilateral lower extremities. Negative for open lesions or macerations.  There is some hyperkeratotic callus tissue and subungual debris to the medial border of the left great toe as well as the lateral border of the right third toe  Vascular: Palpable pedal pulses bilaterally. Capillary refill within normal limits.  Negative for any significant edema or erythema  Neurological: Light touch and protective threshold grossly intact  Musculoskeletal Exam: No pedal deformities noted   Assessment: 1.  Mild ingrowing nail medial border left great toe.  Lateral border right third toe   Plan of Care:  1. Patient evaluated. 2.  Since the patient is currently asymptomatic we are not going to pursue partial nail matricectomy.  Light debridement of these areas was performed using a nail nipper without incident or bleeding.  The patient did feel some relief 3.  Recommend foot lotion daily. 4.  Return to clinic as needed      Edrick Kins, DPM [facility]  Dr. Edrick Kins, DPM    [address], [place] [address]                Office [phone]  Fax [phone]

## 2021-10-05 DIAGNOSIS — M25551 Pain in right hip: Secondary | ICD-10-CM | POA: Diagnosis not present

## 2021-10-05 DIAGNOSIS — I83812 Varicose veins of left lower extremities with pain: Secondary | ICD-10-CM | POA: Diagnosis not present

## 2021-10-05 DIAGNOSIS — M6281 Muscle weakness (generalized): Secondary | ICD-10-CM | POA: Diagnosis not present

## 2021-10-05 DIAGNOSIS — I83892 Varicose veins of left lower extremities with other complications: Secondary | ICD-10-CM | POA: Diagnosis not present

## 2021-10-07 DIAGNOSIS — J301 Allergic rhinitis due to pollen: Secondary | ICD-10-CM | POA: Diagnosis not present

## 2021-10-07 DIAGNOSIS — J3081 Allergic rhinitis due to animal (cat) (dog) hair and dander: Secondary | ICD-10-CM | POA: Diagnosis not present

## 2021-10-07 DIAGNOSIS — J3089 Other allergic rhinitis: Secondary | ICD-10-CM | POA: Diagnosis not present

## 2021-10-08 DIAGNOSIS — I83891 Varicose veins of right lower extremities with other complications: Secondary | ICD-10-CM | POA: Diagnosis not present

## 2021-10-08 DIAGNOSIS — I83811 Varicose veins of right lower extremities with pain: Secondary | ICD-10-CM | POA: Diagnosis not present

## 2021-10-12 DIAGNOSIS — J301 Allergic rhinitis due to pollen: Secondary | ICD-10-CM | POA: Diagnosis not present

## 2021-10-12 DIAGNOSIS — J3081 Allergic rhinitis due to animal (cat) (dog) hair and dander: Secondary | ICD-10-CM | POA: Diagnosis not present

## 2021-10-12 DIAGNOSIS — J3089 Other allergic rhinitis: Secondary | ICD-10-CM | POA: Diagnosis not present

## 2021-10-15 DIAGNOSIS — R591 Generalized enlarged lymph nodes: Secondary | ICD-10-CM | POA: Diagnosis not present

## 2021-10-21 ENCOUNTER — Other Ambulatory Visit: Payer: Self-pay | Admitting: Family Medicine

## 2021-10-21 DIAGNOSIS — K648 Other hemorrhoids: Secondary | ICD-10-CM | POA: Diagnosis not present

## 2021-10-21 DIAGNOSIS — R591 Generalized enlarged lymph nodes: Secondary | ICD-10-CM

## 2021-10-21 DIAGNOSIS — D120 Benign neoplasm of cecum: Secondary | ICD-10-CM | POA: Diagnosis not present

## 2021-10-21 DIAGNOSIS — Z800 Family history of malignant neoplasm of digestive organs: Secondary | ICD-10-CM | POA: Diagnosis not present

## 2021-10-21 DIAGNOSIS — Z1211 Encounter for screening for malignant neoplasm of colon: Secondary | ICD-10-CM | POA: Diagnosis not present

## 2021-10-22 DIAGNOSIS — J3089 Other allergic rhinitis: Secondary | ICD-10-CM | POA: Diagnosis not present

## 2021-10-22 DIAGNOSIS — J3081 Allergic rhinitis due to animal (cat) (dog) hair and dander: Secondary | ICD-10-CM | POA: Diagnosis not present

## 2021-10-22 DIAGNOSIS — J301 Allergic rhinitis due to pollen: Secondary | ICD-10-CM | POA: Diagnosis not present

## 2021-10-27 ENCOUNTER — Other Ambulatory Visit: Payer: Medicare Other

## 2021-10-28 DIAGNOSIS — G4700 Insomnia, unspecified: Secondary | ICD-10-CM | POA: Diagnosis not present

## 2021-10-28 DIAGNOSIS — J301 Allergic rhinitis due to pollen: Secondary | ICD-10-CM | POA: Diagnosis not present

## 2021-10-28 DIAGNOSIS — J3081 Allergic rhinitis due to animal (cat) (dog) hair and dander: Secondary | ICD-10-CM | POA: Diagnosis not present

## 2021-10-28 DIAGNOSIS — J3089 Other allergic rhinitis: Secondary | ICD-10-CM | POA: Diagnosis not present

## 2021-10-28 DIAGNOSIS — G894 Chronic pain syndrome: Secondary | ICD-10-CM | POA: Diagnosis not present

## 2021-10-28 DIAGNOSIS — M47817 Spondylosis without myelopathy or radiculopathy, lumbosacral region: Secondary | ICD-10-CM | POA: Diagnosis not present

## 2021-11-02 DIAGNOSIS — Z79899 Other long term (current) drug therapy: Secondary | ICD-10-CM | POA: Diagnosis not present

## 2021-11-05 DIAGNOSIS — J3089 Other allergic rhinitis: Secondary | ICD-10-CM | POA: Diagnosis not present

## 2021-11-05 DIAGNOSIS — M25551 Pain in right hip: Secondary | ICD-10-CM | POA: Diagnosis not present

## 2021-11-05 DIAGNOSIS — J301 Allergic rhinitis due to pollen: Secondary | ICD-10-CM | POA: Diagnosis not present

## 2021-11-05 DIAGNOSIS — J3081 Allergic rhinitis due to animal (cat) (dog) hair and dander: Secondary | ICD-10-CM | POA: Diagnosis not present

## 2021-11-05 DIAGNOSIS — M6281 Muscle weakness (generalized): Secondary | ICD-10-CM | POA: Diagnosis not present

## 2021-11-06 ENCOUNTER — Ambulatory Visit
Admission: RE | Admit: 2021-11-06 | Discharge: 2021-11-06 | Disposition: A | Discharge status: Home / Self Care | Payer: Medicare Other | Source: Ambulatory Visit | Attending: Family Medicine | Admitting: Family Medicine

## 2021-11-06 DIAGNOSIS — R1909 Other intra-abdominal and pelvic swelling, mass and lump: Secondary | ICD-10-CM | POA: Diagnosis not present

## 2021-11-06 DIAGNOSIS — R591 Generalized enlarged lymph nodes: Secondary | ICD-10-CM

## 2021-11-11 DIAGNOSIS — J3089 Other allergic rhinitis: Secondary | ICD-10-CM | POA: Diagnosis not present

## 2021-11-11 DIAGNOSIS — J3081 Allergic rhinitis due to animal (cat) (dog) hair and dander: Secondary | ICD-10-CM | POA: Diagnosis not present

## 2021-11-11 DIAGNOSIS — J301 Allergic rhinitis due to pollen: Secondary | ICD-10-CM | POA: Diagnosis not present

## 2021-11-17 DIAGNOSIS — J3089 Other allergic rhinitis: Secondary | ICD-10-CM | POA: Diagnosis not present

## 2021-11-17 DIAGNOSIS — J301 Allergic rhinitis due to pollen: Secondary | ICD-10-CM | POA: Diagnosis not present

## 2021-11-17 DIAGNOSIS — Z1329 Encounter for screening for other suspected endocrine disorder: Secondary | ICD-10-CM | POA: Diagnosis not present

## 2021-11-17 DIAGNOSIS — J3081 Allergic rhinitis due to animal (cat) (dog) hair and dander: Secondary | ICD-10-CM | POA: Diagnosis not present

## 2021-11-24 DIAGNOSIS — J3089 Other allergic rhinitis: Secondary | ICD-10-CM | POA: Diagnosis not present

## 2021-11-24 DIAGNOSIS — J301 Allergic rhinitis due to pollen: Secondary | ICD-10-CM | POA: Diagnosis not present

## 2021-11-24 DIAGNOSIS — J3081 Allergic rhinitis due to animal (cat) (dog) hair and dander: Secondary | ICD-10-CM | POA: Diagnosis not present

## 2021-11-27 DIAGNOSIS — J301 Allergic rhinitis due to pollen: Secondary | ICD-10-CM | POA: Diagnosis not present

## 2021-11-27 DIAGNOSIS — J3081 Allergic rhinitis due to animal (cat) (dog) hair and dander: Secondary | ICD-10-CM | POA: Diagnosis not present

## 2021-11-27 DIAGNOSIS — J3089 Other allergic rhinitis: Secondary | ICD-10-CM | POA: Diagnosis not present

## 2021-12-02 DIAGNOSIS — J3081 Allergic rhinitis due to animal (cat) (dog) hair and dander: Secondary | ICD-10-CM | POA: Diagnosis not present

## 2021-12-02 DIAGNOSIS — J3089 Other allergic rhinitis: Secondary | ICD-10-CM | POA: Diagnosis not present

## 2021-12-02 DIAGNOSIS — J301 Allergic rhinitis due to pollen: Secondary | ICD-10-CM | POA: Diagnosis not present

## 2021-12-03 DIAGNOSIS — M6281 Muscle weakness (generalized): Secondary | ICD-10-CM | POA: Diagnosis not present

## 2021-12-03 DIAGNOSIS — M25551 Pain in right hip: Secondary | ICD-10-CM | POA: Diagnosis not present

## 2021-12-08 DIAGNOSIS — J301 Allergic rhinitis due to pollen: Secondary | ICD-10-CM | POA: Diagnosis not present

## 2021-12-08 DIAGNOSIS — J3081 Allergic rhinitis due to animal (cat) (dog) hair and dander: Secondary | ICD-10-CM | POA: Diagnosis not present

## 2021-12-08 DIAGNOSIS — J3089 Other allergic rhinitis: Secondary | ICD-10-CM | POA: Diagnosis not present

## 2021-12-14 DIAGNOSIS — J301 Allergic rhinitis due to pollen: Secondary | ICD-10-CM | POA: Diagnosis not present

## 2021-12-14 DIAGNOSIS — J3089 Other allergic rhinitis: Secondary | ICD-10-CM | POA: Diagnosis not present

## 2021-12-14 DIAGNOSIS — J3081 Allergic rhinitis due to animal (cat) (dog) hair and dander: Secondary | ICD-10-CM | POA: Diagnosis not present

## 2021-12-18 DIAGNOSIS — J301 Allergic rhinitis due to pollen: Secondary | ICD-10-CM | POA: Diagnosis not present

## 2021-12-18 DIAGNOSIS — J3081 Allergic rhinitis due to animal (cat) (dog) hair and dander: Secondary | ICD-10-CM | POA: Diagnosis not present

## 2021-12-18 DIAGNOSIS — J3089 Other allergic rhinitis: Secondary | ICD-10-CM | POA: Diagnosis not present

## 2021-12-21 DIAGNOSIS — J301 Allergic rhinitis due to pollen: Secondary | ICD-10-CM | POA: Diagnosis not present

## 2021-12-21 DIAGNOSIS — J3081 Allergic rhinitis due to animal (cat) (dog) hair and dander: Secondary | ICD-10-CM | POA: Diagnosis not present

## 2021-12-21 DIAGNOSIS — J3089 Other allergic rhinitis: Secondary | ICD-10-CM | POA: Diagnosis not present

## 2021-12-24 DIAGNOSIS — G894 Chronic pain syndrome: Secondary | ICD-10-CM | POA: Diagnosis not present

## 2021-12-24 DIAGNOSIS — Z79891 Long term (current) use of opiate analgesic: Secondary | ICD-10-CM | POA: Diagnosis not present

## 2021-12-24 DIAGNOSIS — G4700 Insomnia, unspecified: Secondary | ICD-10-CM | POA: Diagnosis not present

## 2021-12-24 DIAGNOSIS — M47817 Spondylosis without myelopathy or radiculopathy, lumbosacral region: Secondary | ICD-10-CM | POA: Diagnosis not present

## 2022-01-03 DIAGNOSIS — M6281 Muscle weakness (generalized): Secondary | ICD-10-CM | POA: Diagnosis not present

## 2022-01-03 DIAGNOSIS — M25551 Pain in right hip: Secondary | ICD-10-CM | POA: Diagnosis not present

## 2022-01-06 DIAGNOSIS — I87393 Chronic venous hypertension (idiopathic) with other complications of bilateral lower extremity: Secondary | ICD-10-CM | POA: Diagnosis not present

## 2022-01-06 DIAGNOSIS — I872 Venous insufficiency (chronic) (peripheral): Secondary | ICD-10-CM | POA: Diagnosis not present

## 2022-01-06 DIAGNOSIS — M4306 Spondylolysis, lumbar region: Secondary | ICD-10-CM | POA: Diagnosis not present

## 2022-01-06 DIAGNOSIS — R600 Localized edema: Secondary | ICD-10-CM | POA: Diagnosis not present

## 2022-01-06 DIAGNOSIS — R61 Generalized hyperhidrosis: Secondary | ICD-10-CM | POA: Diagnosis not present

## 2022-01-06 DIAGNOSIS — E7800 Pure hypercholesterolemia, unspecified: Secondary | ICD-10-CM | POA: Diagnosis not present

## 2022-01-06 DIAGNOSIS — I10 Essential (primary) hypertension: Secondary | ICD-10-CM | POA: Diagnosis not present

## 2022-01-06 DIAGNOSIS — J302 Other seasonal allergic rhinitis: Secondary | ICD-10-CM | POA: Diagnosis not present

## 2022-01-06 DIAGNOSIS — M7989 Other specified soft tissue disorders: Secondary | ICD-10-CM | POA: Diagnosis not present

## 2022-01-07 DIAGNOSIS — J3089 Other allergic rhinitis: Secondary | ICD-10-CM | POA: Diagnosis not present

## 2022-01-07 DIAGNOSIS — J3081 Allergic rhinitis due to animal (cat) (dog) hair and dander: Secondary | ICD-10-CM | POA: Diagnosis not present

## 2022-01-07 DIAGNOSIS — J301 Allergic rhinitis due to pollen: Secondary | ICD-10-CM | POA: Diagnosis not present

## 2022-01-10 ENCOUNTER — Other Ambulatory Visit: Payer: Self-pay

## 2022-01-10 ENCOUNTER — Ambulatory Visit
Admission: EM | Admit: 2022-01-10 | Discharge: 2022-01-10 | Disposition: A | Discharge status: Home / Self Care | Payer: Medicare Other | Attending: Urgent Care | Admitting: Urgent Care

## 2022-01-10 DIAGNOSIS — J0100 Acute maxillary sinusitis, unspecified: Secondary | ICD-10-CM

## 2022-01-10 DIAGNOSIS — J209 Acute bronchitis, unspecified: Secondary | ICD-10-CM | POA: Diagnosis not present

## 2022-01-10 MED ORDER — GUAIFENESIN ER 600 MG PO TB12: 600.0000 mg | ORAL_TABLET | Freq: Two times a day (BID) | ORAL | 0 refills | Status: DC | PRN

## 2022-01-10 MED ORDER — PREDNISONE 50 MG PO TABS: 50.0000 mg | ORAL_TABLET | Freq: Every day | ORAL | 0 refills | Status: AC

## 2022-01-10 MED ORDER — AMOXICILLIN-POT CLAVULANATE 875-125 MG PO TABS: 1.0000 | ORAL_TABLET | Freq: Two times a day (BID) | ORAL | 0 refills | Status: AC

## 2022-01-10 MED ORDER — PROAIR RESPICLICK 108 (90 BASE) MCG/ACT IN AEPB: 1.0000 | INHALATION_SPRAY | RESPIRATORY_TRACT | 0 refills | Status: AC | PRN

## 2022-01-10 NOTE — Discharge Instructions (Addendum)
You are suffering from acute bronchitis and sinusitis. ?The postnasal drainage is likely the cause of your lower respiratory tract symptoms and your sore throat. ?Please use humidification and steam from a shower to help open up your nasal passages. ?Over-the-counter Flonase would also be beneficial. ?I have called in Augmentin, an antibiotic, for you to take twice daily with food for 10 days. ?I have also called in oral prednisone.  Take 1 tablet every day until gone.  Best taken in the morning to prevent insomnia.  This should help with your respiratory symptoms. ?I refilled a prescription called ProAir Respiclick.  This will help with your wheezing and cough. ?Please take the Mucinex called in to help loosen the phlegm.  Please cough it out throughout the day.  Only take over-the-counter cough medication at nighttime to help you sleep. ?Follow-up with your PCP should symptoms persist or continue. ?

## 2022-01-10 NOTE — ED Triage Notes (Signed)
Pt c/o cough with phlegm, nasal congestion,  ? ?Denies sore throat, ear ache, headache, nausea, vomiting, constipation, diarrhea, body aches or chills  ? ?States gets allergy injections and concerned for sinus infection  ? ?Onset ~ 1 week ago  ?

## 2022-01-10 NOTE — ED Provider Notes (Signed)
?[place] ? ? ? ?CSN: [id number] ?Arrival date & time: 01/10/22  0900 ? ? ?  ? ?History   ?Chief Complaint ?Chief Complaint  ?Patient presents with  ? Cough  ? ? ?HPI ?Ashley [name] is a 46 y.o. female.  ? ?Pleasant 46 year old female presents today with a 6-day history of sinus pain, cough, nasal congestion, sore throat.  She was recently on a cruise ship and symptoms started shortly after arrival back home.  She saw her PCP on Wednesday who stated that her lungs were clear, and recommended continuing over-the-counter medications.  Patient states that yesterday and today she has developed severe maxillary sinus pain, worse with leaning forward.  It is causing a headache.  She denies a known fever but has felt hot.  She reports copious nasal drainage that is also causing a sore throat.  Her cough is productive of a copious amount of green-yellow phlegm.  She denies blood.  She reports a history of pneumonia in the past (2016), but denies chronic lung disease or asthma. No CP, SOB, DOE.  ? ? ?Cough ?Associated symptoms: rhinorrhea   ? ?Past Medical History:  ?Diagnosis Date  ? Arthritis   ? knees, back  ? 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  ? Complication of anesthesia   ? Depression   ? History of pneumonia   ? Hypertension   ? Lumbar spondylosis   ? PCOS (polycystic ovarian syndrome)   ? Pneumonia   ? 2016  ? PONV (postoperative nausea and vomiting)   ? ? ?Patient Active Problem List  ? Diagnosis Date Noted  ? 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 Flat foot 05/07/2019  ? Abnormality of gait 02/22/2019  ? Lumbosacral radiculopathy at L4 06/09/2018  ? Generalized anxiety disorder   ? Pain   ? Fall   ? Labile blood pressure   ? Neuropathic pain   ? Therapeutic opioid induced constipation   ? Urinary retention   ? Lumbar radiculopathy 02/21/2018  ? PCOS (polycystic ovarian syndrome)   ? Obesity   ? Depression   ? Generalized OA   ? Postoperative pain   ? Acute blood loss anemia   ?  Status post surgery 02/17/2018  ? Lumbar radiculopathy, chronic 02/17/2018  ? Primary localized osteoarthritis of right hip 04/21/2016  ? Sinusitis 06/25/2015  ? Cough 06/25/2015  ? Essential hypertension 04/03/2015  ? Dehydration 04/03/2015  ? CAP (community acquired pneumonia) 03/31/2015  ? Right lower lobe pneumonia 03/31/2015  ? Fever 03/31/2015  ? Pregnancy 04/29/2014  ? Miscarriage   ? Incomplete miscarriage 04/02/2011  ?  Class: Acute  ? 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  Class: Chronic  ? ? ?Past Surgical History:  ?Procedure Laterality Date  ? ABDOMINAL EXPOSURE N/A 02/17/2018  ? Procedure: ABDOMINAL EXPOSURE;  Surgeon: Rosetta Posner, MD;  Location: [facility];  Service: Vascular;  Laterality: N/A;  ? ANKLE SURGERY    ? x 2  ? ANTERIOR LAT LUMBAR FUSION N/A 02/17/2018  ? Procedure: Lumbar three-four Lumbar four-five  Anterolateral lumbar interbody fusion;  Surgeon: Kristeen Miss, MD;  Location: [place];  Service: Neurosurgery;  Laterality: N/A;  ? ANTERIOR LUMBAR FUSION N/A 02/17/2018  ? Procedure: Lumbar five-Sacral one Anterior lumbar interbody fusion;  Surgeon: Kristeen Miss, MD;  Location: [place];  Service: Neurosurgery;  Laterality: N/A;  ? APPLICATION OF ROBOTIC ASSISTANCE FOR SPINAL PROCEDURE N/A 02/17/2018  ? Procedure: APPLICATION OF ROBOTIC ASSISTANCE FOR SPINAL PROCEDURE;  Surgeon: Kristeen Miss, MD;  Location: [place];  Service: Neurosurgery;  Laterality: N/A;  ? APPLICATION OF ROBOTIC ASSISTANCE FOR SPINAL PROCEDURE N/A 06/09/2018  ? Procedure: APPLICATION OF ROBOTIC ASSISTANCE FOR SPINAL PROCEDURE;  Surgeon: Kristeen Miss, MD;  Location: [place];  Service: Neurosurgery;  Laterality: N/A;  ? BACK SURGERY    ? 2017  discectomy  ? CARPAL TUNNEL RELEASE Left 07/04/2014  ? Procedure: LEFT CARPAL TUNNEL RELEASE;  Surgeon: Ninetta Lights, MD;  Location: [place];  Service: Orthopedics;  Laterality: Left;  ? CARPAL TUNNEL RELEASE Right 08/08/2014  ? Procedure: RIGHT CARPAL TUNNEL  RELEASE;  Surgeon: Ninetta Lights, MD;  Location: [place];  Service: Orthopedics;  Laterality: Right;  ? DILATION AND CURETTAGE OF UTERUS    ? DILATION AND EVACUATION  04/02/2011  ? Procedure: DILATATION AND EVACUATION (D&E);  Surgeon: Luz Lex, MD;  Location: [facility];  Service: Gynecology;  Laterality: N/A;  dvt left mid thigh  ? DORSAL COMPARTMENT RELEASE Left 07/04/2014  ? Procedure: LEFT DEQUERVAINS;  Surgeon: Ninetta Lights, MD;  Location: [place];  Service: Orthopedics;  Laterality: Left;  ? ENDOSCOPIC PLANTAR FASCIOTOMY Left 02/01/2002  ? HARDWARE REMOVAL Right 06/09/2018  ? Procedure: Repositioning of Right Lumbar three Right lumbar  four pedicle screw with MAZOR;  Surgeon: Kristeen Miss, MD;  Location: [place];  Service: Neurosurgery;  Laterality: Right;  ? HYSTEROSCOPY WITH D & C  07/17/2010  ? with exc. endometrial polyps  ? KNEE ARTHROSCOPY Right 03/07/2003; 01/14/2005; 08/31/2006  ? KNEE ARTHROSCOPY Left 03/21/2003; 11/26/2004  ? LUMBAR PERCUTANEOUS PEDICLE SCREW 3 LEVEL N/A 02/17/2018  ? Procedure: LUMBAR PERCUTANEOUS PEDICLE SCREW PLACEMENT LUMBAR THREE-SACRAL ONE;  Surgeon: Kristeen Miss, MD;  Location: [place];  Service: Neurosurgery;  Laterality: N/A;  ? SHOULDER ARTHROSCOPY Right   ? TOE SURGERY Left 01/10/2003  ? claw toe correction 2nd, 3rd, 4th toes  ? TOTAL HIP ARTHROPLASTY Right 04/21/2016  ? TOTAL HIP ARTHROPLASTY Right 04/21/2016  ? Procedure: TOTAL HIP ARTHROPLASTY ANTERIOR APPROACH;  Surgeon: Ninetta Lights, MD;  Location: [place];  Service: Orthopedics;  Laterality: Right;  ? TOTAL KNEE ARTHROPLASTY Left 01/21/2010  ? TOTAL KNEE ARTHROPLASTY Right 08/26/2008  ? UPPER GI ENDOSCOPY N/A 04/14/2021  ? Procedure: UPPER GI ENDOSCOPY;  Surgeon: Johnathan Hausen, MD;  [facility];  Service: General;  Laterality: N/A;  ? ? ?OB History   ? ? Gravida  ?4  ? Para  ?2  ? Term  ?1  ? Preterm  ?1  ? AB  ?2  ? Living  ?2  ?  ? ? SAB  ?1  ? IAB  ?1  ? Ectopic  ?   ? Multiple  ?   ?  Live Births  ?2  ?   ?  ?  ? ? ? ?Home Medications   ? ?Prior to Admission medications   ?Medication Sig Start Date End Date Taking? Authorizing Provider  ?amoxicillin-clavulanate (AUGMENTIN) 875-125 MG tablet Take 1 tablet by mouth 2 (two) times daily for 10 days. 01/10/22 01/20/22 Yes Natavia Sublette L, PA  ?guaiFENesin (MUCINEX) 600 MG 12 hr tablet Take 1 tablet (600 mg total) by mouth 2 (two) times daily as needed for cough or to loosen phlegm. 01/10/22  Yes Kazden Largo L, PA  ?predniSONE (DELTASONE) 50 MG tablet Take 1 tablet (50 mg total) by mouth daily with breakfast for 4 days. 01/10/22 01/14/22 Yes Mohd. Derflinger L, PA  ?Albuterol  Sulfate (PROAIR RESPICLICK) 976 (90 Base) MCG/ACT AEPB Inhale 1 puff into the lungs every 4 (four) hours as needed. 01/10/22   Kiesha Ensey L, PA  ?amLODipine (NORVASC) 2.5 MG tablet Take 2.5 mg by mouth daily.    [provider]  ?amLODipine (NORVASC) 5 MG tablet  05/22/20   [provider]  ?baclofen (LIORESAL) 10 MG tablet Take 3 tablets (30 mg total) by mouth 3 (three) times daily. ?Patient taking differently: Take 10 mg by mouth 3 (three) times daily. 01/26/19   Jamse Arn, MD  ?baclofen (LIORESAL) 10 MG tablet Take by mouth.    [provider]  ?buPROPion (WELLBUTRIN XL) 150 MG 24 hr tablet Take 450 mg by mouth daily. 01/21/21   [provider]  ?cetirizine (ZYRTEC) 10 MG tablet Take 10 mg by mouth daily as needed for allergies.     [provider]  ?docusate sodium (COLACE) 100 MG capsule 1 capsule as needed for mild constipation    [provider]  ?enoxaparin (LOVENOX) 40 MG/0.4ML injection Inject 0.4 mLs (40 mg total) into the skin every 12 (twelve) hours for 28 days. 04/16/21 05/14/21  Johnathan Hausen, MD  ?EPINEPHrine 0.3 mg/0.3 mL IJ SOAJ injection Inject 3 mLs into the muscle once. 05/26/18   [provider]  ?fluticasone (FLONASE) 50 MCG/ACT nasal spray Place 1 spray into both nostrils daily as needed for  allergies. 03/03/21   [provider]  ?furosemide (LASIX) 20 MG tablet Take 20 mg by mouth daily as needed for edema. 02/18/21   [provider]  ?glycopyrrolate (ROBINUL) 1 MG tablet Take 1 mg by

## 2022-01-11 ENCOUNTER — Ambulatory Visit: Payer: Self-pay

## 2022-01-12 DIAGNOSIS — J301 Allergic rhinitis due to pollen: Secondary | ICD-10-CM | POA: Diagnosis not present

## 2022-01-12 DIAGNOSIS — J3089 Other allergic rhinitis: Secondary | ICD-10-CM | POA: Diagnosis not present

## 2022-01-12 DIAGNOSIS — J3081 Allergic rhinitis due to animal (cat) (dog) hair and dander: Secondary | ICD-10-CM | POA: Diagnosis not present

## 2022-01-18 DIAGNOSIS — J3089 Other allergic rhinitis: Secondary | ICD-10-CM | POA: Diagnosis not present

## 2022-01-18 DIAGNOSIS — J3081 Allergic rhinitis due to animal (cat) (dog) hair and dander: Secondary | ICD-10-CM | POA: Diagnosis not present

## 2022-01-18 DIAGNOSIS — J301 Allergic rhinitis due to pollen: Secondary | ICD-10-CM | POA: Diagnosis not present

## 2022-01-20 ENCOUNTER — Ambulatory Visit: Payer: Medicare Other

## 2022-01-26 DIAGNOSIS — J301 Allergic rhinitis due to pollen: Secondary | ICD-10-CM | POA: Diagnosis not present

## 2022-01-26 DIAGNOSIS — J3089 Other allergic rhinitis: Secondary | ICD-10-CM | POA: Diagnosis not present

## 2022-01-26 DIAGNOSIS — Z79899 Other long term (current) drug therapy: Secondary | ICD-10-CM | POA: Diagnosis not present

## 2022-01-26 DIAGNOSIS — J3081 Allergic rhinitis due to animal (cat) (dog) hair and dander: Secondary | ICD-10-CM | POA: Diagnosis not present

## 2022-01-29 DIAGNOSIS — Z91013 Allergy to seafood: Secondary | ICD-10-CM | POA: Insufficient documentation

## 2022-01-29 DIAGNOSIS — H1045 Other chronic allergic conjunctivitis: Secondary | ICD-10-CM | POA: Insufficient documentation

## 2022-01-29 DIAGNOSIS — J3081 Allergic rhinitis due to animal (cat) (dog) hair and dander: Secondary | ICD-10-CM | POA: Insufficient documentation

## 2022-01-29 DIAGNOSIS — J301 Allergic rhinitis due to pollen: Secondary | ICD-10-CM | POA: Insufficient documentation

## 2022-01-29 DIAGNOSIS — J309 Allergic rhinitis, unspecified: Secondary | ICD-10-CM | POA: Insufficient documentation

## 2022-02-02 DIAGNOSIS — M6281 Muscle weakness (generalized): Secondary | ICD-10-CM | POA: Diagnosis not present

## 2022-02-02 DIAGNOSIS — M5116 Intervertebral disc disorders with radiculopathy, lumbar region: Secondary | ICD-10-CM | POA: Diagnosis not present

## 2022-02-02 DIAGNOSIS — M5416 Radiculopathy, lumbar region: Secondary | ICD-10-CM | POA: Diagnosis not present

## 2022-02-02 DIAGNOSIS — M25551 Pain in right hip: Secondary | ICD-10-CM | POA: Diagnosis not present

## 2022-02-03 DIAGNOSIS — R61 Generalized hyperhidrosis: Secondary | ICD-10-CM | POA: Diagnosis not present

## 2022-02-03 DIAGNOSIS — J3089 Other allergic rhinitis: Secondary | ICD-10-CM | POA: Diagnosis not present

## 2022-02-03 DIAGNOSIS — I10 Essential (primary) hypertension: Secondary | ICD-10-CM | POA: Diagnosis not present

## 2022-02-03 DIAGNOSIS — J301 Allergic rhinitis due to pollen: Secondary | ICD-10-CM | POA: Diagnosis not present

## 2022-02-03 DIAGNOSIS — J3081 Allergic rhinitis due to animal (cat) (dog) hair and dander: Secondary | ICD-10-CM | POA: Diagnosis not present

## 2022-02-03 DIAGNOSIS — E7800 Pure hypercholesterolemia, unspecified: Secondary | ICD-10-CM | POA: Diagnosis not present

## 2022-02-03 DIAGNOSIS — G8929 Other chronic pain: Secondary | ICD-10-CM | POA: Diagnosis not present

## 2022-02-03 DIAGNOSIS — Z0000 Encounter for general adult medical examination without abnormal findings: Secondary | ICD-10-CM | POA: Diagnosis not present

## 2022-02-04 ENCOUNTER — Ambulatory Visit: Payer: Medicare Other | Admitting: Physical Therapy

## 2022-02-09 DIAGNOSIS — J301 Allergic rhinitis due to pollen: Secondary | ICD-10-CM | POA: Diagnosis not present

## 2022-02-09 DIAGNOSIS — J3089 Other allergic rhinitis: Secondary | ICD-10-CM | POA: Diagnosis not present

## 2022-02-09 DIAGNOSIS — J3081 Allergic rhinitis due to animal (cat) (dog) hair and dander: Secondary | ICD-10-CM | POA: Diagnosis not present

## 2022-02-17 DIAGNOSIS — J3081 Allergic rhinitis due to animal (cat) (dog) hair and dander: Secondary | ICD-10-CM | POA: Diagnosis not present

## 2022-02-17 DIAGNOSIS — J301 Allergic rhinitis due to pollen: Secondary | ICD-10-CM | POA: Diagnosis not present

## 2022-02-17 DIAGNOSIS — J3089 Other allergic rhinitis: Secondary | ICD-10-CM | POA: Diagnosis not present

## 2022-02-22 DIAGNOSIS — G894 Chronic pain syndrome: Secondary | ICD-10-CM | POA: Diagnosis not present

## 2022-02-22 DIAGNOSIS — J3081 Allergic rhinitis due to animal (cat) (dog) hair and dander: Secondary | ICD-10-CM | POA: Diagnosis not present

## 2022-02-22 DIAGNOSIS — G4700 Insomnia, unspecified: Secondary | ICD-10-CM | POA: Diagnosis not present

## 2022-02-22 DIAGNOSIS — J3089 Other allergic rhinitis: Secondary | ICD-10-CM | POA: Diagnosis not present

## 2022-02-22 DIAGNOSIS — M47817 Spondylosis without myelopathy or radiculopathy, lumbosacral region: Secondary | ICD-10-CM | POA: Diagnosis not present

## 2022-02-22 DIAGNOSIS — J301 Allergic rhinitis due to pollen: Secondary | ICD-10-CM | POA: Diagnosis not present

## 2022-03-03 DIAGNOSIS — J3081 Allergic rhinitis due to animal (cat) (dog) hair and dander: Secondary | ICD-10-CM | POA: Diagnosis not present

## 2022-03-03 DIAGNOSIS — J3089 Other allergic rhinitis: Secondary | ICD-10-CM | POA: Diagnosis not present

## 2022-03-03 DIAGNOSIS — J301 Allergic rhinitis due to pollen: Secondary | ICD-10-CM | POA: Diagnosis not present

## 2022-03-05 DIAGNOSIS — M25551 Pain in right hip: Secondary | ICD-10-CM | POA: Diagnosis not present

## 2022-03-05 DIAGNOSIS — M6281 Muscle weakness (generalized): Secondary | ICD-10-CM | POA: Diagnosis not present

## 2022-03-09 DIAGNOSIS — J3089 Other allergic rhinitis: Secondary | ICD-10-CM | POA: Diagnosis not present

## 2022-03-09 DIAGNOSIS — J301 Allergic rhinitis due to pollen: Secondary | ICD-10-CM | POA: Diagnosis not present

## 2022-03-09 DIAGNOSIS — J3081 Allergic rhinitis due to animal (cat) (dog) hair and dander: Secondary | ICD-10-CM | POA: Diagnosis not present

## 2022-03-16 DIAGNOSIS — M47816 Spondylosis without myelopathy or radiculopathy, lumbar region: Secondary | ICD-10-CM | POA: Diagnosis not present

## 2022-03-17 DIAGNOSIS — J3089 Other allergic rhinitis: Secondary | ICD-10-CM | POA: Diagnosis not present

## 2022-03-17 DIAGNOSIS — J3081 Allergic rhinitis due to animal (cat) (dog) hair and dander: Secondary | ICD-10-CM | POA: Diagnosis not present

## 2022-03-17 DIAGNOSIS — J301 Allergic rhinitis due to pollen: Secondary | ICD-10-CM | POA: Diagnosis not present

## 2022-03-24 DIAGNOSIS — J301 Allergic rhinitis due to pollen: Secondary | ICD-10-CM | POA: Diagnosis not present

## 2022-03-24 DIAGNOSIS — J3081 Allergic rhinitis due to animal (cat) (dog) hair and dander: Secondary | ICD-10-CM | POA: Diagnosis not present

## 2022-03-24 DIAGNOSIS — J3089 Other allergic rhinitis: Secondary | ICD-10-CM | POA: Diagnosis not present

## 2022-03-30 DIAGNOSIS — J3081 Allergic rhinitis due to animal (cat) (dog) hair and dander: Secondary | ICD-10-CM | POA: Diagnosis not present

## 2022-03-30 DIAGNOSIS — J3089 Other allergic rhinitis: Secondary | ICD-10-CM | POA: Diagnosis not present

## 2022-03-30 DIAGNOSIS — J301 Allergic rhinitis due to pollen: Secondary | ICD-10-CM | POA: Diagnosis not present

## 2022-04-04 DIAGNOSIS — M25551 Pain in right hip: Secondary | ICD-10-CM | POA: Diagnosis not present

## 2022-04-04 DIAGNOSIS — M6281 Muscle weakness (generalized): Secondary | ICD-10-CM | POA: Diagnosis not present

## 2022-04-05 DIAGNOSIS — J3081 Allergic rhinitis due to animal (cat) (dog) hair and dander: Secondary | ICD-10-CM | POA: Diagnosis not present

## 2022-04-05 DIAGNOSIS — J301 Allergic rhinitis due to pollen: Secondary | ICD-10-CM | POA: Diagnosis not present

## 2022-04-05 DIAGNOSIS — J3089 Other allergic rhinitis: Secondary | ICD-10-CM | POA: Diagnosis not present

## 2022-04-14 DIAGNOSIS — J3089 Other allergic rhinitis: Secondary | ICD-10-CM | POA: Diagnosis not present

## 2022-04-14 DIAGNOSIS — J3081 Allergic rhinitis due to animal (cat) (dog) hair and dander: Secondary | ICD-10-CM | POA: Diagnosis not present

## 2022-04-14 DIAGNOSIS — J301 Allergic rhinitis due to pollen: Secondary | ICD-10-CM | POA: Diagnosis not present

## 2022-04-16 DIAGNOSIS — L723 Sebaceous cyst: Secondary | ICD-10-CM | POA: Diagnosis not present

## 2022-04-19 ENCOUNTER — Encounter: Payer: Medicare Other | Attending: Surgery | Admitting: Skilled Nursing Facility1

## 2022-04-19 DIAGNOSIS — Z713 Dietary counseling and surveillance: Secondary | ICD-10-CM | POA: Insufficient documentation

## 2022-04-19 DIAGNOSIS — E669 Obesity, unspecified: Secondary | ICD-10-CM

## 2022-04-19 NOTE — Progress Notes (Signed)
Bariatric Nutrition Follow-Up Visit Medical Nutrition Therapy   NUTRITION ASSESSMENT    Anthropometrics  Start weight at [facility]: 298 lbs (date: 07/08/2020) Today's weight: 242 pounds  Clinical  Medical Hx: HTN, PCOS Medications: see list Labs:  Notable Signs/Symptoms: chronic pain  Body Composition Scale 04/29/2021 06/04/2021 08/12/2021 04/19/2022  Current Body Weight 281.3 274.6 258.2 242  Total Body Fat % 44.2 43.6 42 39.9  Visceral Fat '14 13 12 11  '$ Fat-Free Mass % 55.7 56.3 57.9 60   Total Body Water % 42.3 42.6 43.4 44.5  Muscle-Mass lbs 37.3 37.3 37.1 37.1  BMI 39.1 38.1 35.8 33.2  Body Fat Displacement             Torso  lbs 77.2 74.2 67.2 59.9         Left Leg  lbs 15.4 14.8 13.4 11.9         Right Leg  lbs 15.4 14.8 13.4 11.9         Left Arm  lbs 7.7 7.4 6.7 5.9         Right Arm   lbs 7.7 7.4 6.7 5.9     Lifestyle & Dietary Hx   Pt states she is very happy with her weight loss.  Pt states she does not take the vyvanse daily.  Pt states she does wake in the middle of the night to eat.  Pt states she is trying to make more meals from home but it is difficult with her picky daughter.  Pt state she is going through a lot of stress in her life currently.   Asking her doctor for PT was discussed and pt state she would be interested in that.   Estimated daily fluid intake: 64+ oz Estimated daily protein intake: 70+ g Supplements: multi and calcium  Current average weekly physical activity: ADL's due to back pain  24-Hr Dietary Recall First Meal: cream of wheat + 1T sugar + butter + bacon Snack:  cheese Second Meal: kids meal from subway or [employer] + cookie Snack: nuts or fruit Third Meal: skipped or gravy over chicken + white rice Snack: eating nuts in the middle of the night Beverages: curkle water, lemonade, juice  Post-Op Goals/ Signs/ Symptoms Using straws: no Drinking while eating: no Chewing/swallowing difficulties: no Changes in vision:  no Changes to mood/headaches: no Hair loss/changes to skin/nails: no Difficulty focusing/concentrating: no Sweating: no Dizziness/lightheadedness: no Palpitations: no  Carbonated/caffeinated beverages: no N/V/D/C/Gas: no Abdominal pain: no Dumping syndrome: no    NUTRITION DIAGNOSIS  Overweight/obesity ([place]-3.3) related to past poor dietary habits and physical inactivity as evidenced by completed bariatric surgery and following dietary guidelines for continued weight loss and healthy nutrition status.     NUTRITION INTERVENTION Nutrition counseling (C-1) and education (E-2) to facilitate bariatric surgery goals, including:  The importance of consuming adequate calories as well as certain nutrients daily due to the body's need for essential vitamins, minerals, and fats The importance of daily physical activity and to reach a goal of at least 150 minutes of moderate to vigorous physical activity weekly (or as directed by their physician) due to benefits such as increased musculature and improved lab values The importance of intuitive eating specifically learning hunger-satiety cues and understanding the importance of learning a new body: The importance of mindful eating to avoid grazing behaviors  Educated pt on emotional eating, how to identify, and strategies to reduce the behavior  Importance of vegetables To have an overall healthy diet, adult men  and women are recommended to consume anywhere from 2-3 cups of vegetables daily. Vegetables provide a wide range of vitamins and minerals such as vitamin A, vitamin C, potassium, and folic acid. According to the [employer], including fruit and vegetables daily may reduce the risk of cardiovascular disease, certain cancers, and other non-communicable diseases.   Handouts Provided Include  Phase 7   Learning Style & Readiness for Change Teaching method utilized: Visual & Auditory  Demonstrated degree of understanding via:  Teach Back  Readiness Level: action Barriers to learning/adherence to lifestyle change: stress    MONITORING & EVALUATION Dietary intake, weekly physical activity, body weight Next Steps Patient is to follow-up in 6 months

## 2022-04-20 DIAGNOSIS — G894 Chronic pain syndrome: Secondary | ICD-10-CM | POA: Diagnosis not present

## 2022-04-20 DIAGNOSIS — M47817 Spondylosis without myelopathy or radiculopathy, lumbosacral region: Secondary | ICD-10-CM | POA: Diagnosis not present

## 2022-04-20 DIAGNOSIS — G4700 Insomnia, unspecified: Secondary | ICD-10-CM | POA: Diagnosis not present

## 2022-04-21 DIAGNOSIS — J3081 Allergic rhinitis due to animal (cat) (dog) hair and dander: Secondary | ICD-10-CM | POA: Diagnosis not present

## 2022-04-21 DIAGNOSIS — J301 Allergic rhinitis due to pollen: Secondary | ICD-10-CM | POA: Diagnosis not present

## 2022-04-21 DIAGNOSIS — J3089 Other allergic rhinitis: Secondary | ICD-10-CM | POA: Diagnosis not present

## 2022-04-26 DIAGNOSIS — J3081 Allergic rhinitis due to animal (cat) (dog) hair and dander: Secondary | ICD-10-CM | POA: Diagnosis not present

## 2022-04-26 DIAGNOSIS — J3089 Other allergic rhinitis: Secondary | ICD-10-CM | POA: Diagnosis not present

## 2022-04-26 DIAGNOSIS — J301 Allergic rhinitis due to pollen: Secondary | ICD-10-CM | POA: Diagnosis not present

## 2022-04-27 ENCOUNTER — Ambulatory Visit: Payer: Medicare Other | Admitting: Physical Therapy

## 2022-04-29 ENCOUNTER — Ambulatory Visit: Payer: Medicare Other | Attending: Physical Medicine and Rehabilitation | Admitting: Physical Therapy

## 2022-04-29 DIAGNOSIS — M6281 Muscle weakness (generalized): Secondary | ICD-10-CM

## 2022-04-29 DIAGNOSIS — R2689 Other abnormalities of gait and mobility: Secondary | ICD-10-CM | POA: Diagnosis not present

## 2022-04-29 DIAGNOSIS — G8929 Other chronic pain: Secondary | ICD-10-CM | POA: Diagnosis not present

## 2022-04-29 DIAGNOSIS — M545 Low back pain, unspecified: Secondary | ICD-10-CM | POA: Diagnosis not present

## 2022-04-29 DIAGNOSIS — M25552 Pain in left hip: Secondary | ICD-10-CM | POA: Diagnosis not present

## 2022-04-29 NOTE — Therapy (Signed)
OUTPATIENT PHYSICAL THERAPY LOWER EXTREMITY EVALUATION   Patient Name: Ashley [name] MRN: [id number] DOB:May 15, 1976, 46 y.o., female Today's Date: 04/29/2022   PT End of Session - 04/29/22 0865     Visit Number 1    Number of Visits 7   Plus eval   Date for PT Re-Evaluation 06/17/22    Authorization Type [employer] Medicare    Progress Note Due on Visit 10    PT Start Time 0935   Pt arrived late   PT Stop Time 1015    PT Time Calculation (min) 40 min    Activity Tolerance Patient tolerated treatment well    Behavior During Therapy [facility] for tasks assessed/performed             Past Medical History:  Diagnosis Date   Arthritis    knees, back   Carpal tunnel syndrome of right wrist [id number]   Complication of anesthesia    Depression    History of pneumonia    Hypertension    Lumbar spondylosis    PCOS (polycystic ovarian syndrome)    Pneumonia    2016   PONV (postoperative nausea and vomiting)    Past Surgical History:  Procedure Laterality Date   ABDOMINAL EXPOSURE N/A 02/17/2018   Procedure: ABDOMINAL EXPOSURE;  Surgeon: Rosetta Posner, MD;  Location: [facility];  Service: Vascular;  Laterality: N/A;   ANKLE SURGERY     x 2   ANTERIOR LAT LUMBAR FUSION N/A 02/17/2018   Procedure: Lumbar three-four Lumbar four-five  Anterolateral lumbar interbody fusion;  Surgeon: Kristeen Miss, MD;  Location: [place];  Service: Neurosurgery;  Laterality: N/A;   ANTERIOR LUMBAR FUSION N/A 02/17/2018   Procedure: Lumbar five-Sacral one Anterior lumbar interbody fusion;  Surgeon: Kristeen Miss, MD;  Location: [place];  Service: Neurosurgery;  Laterality: N/A;   APPLICATION OF ROBOTIC ASSISTANCE FOR SPINAL PROCEDURE N/A 02/17/2018   Procedure: APPLICATION OF ROBOTIC ASSISTANCE FOR SPINAL PROCEDURE;  Surgeon: Kristeen Miss, MD;  Location: [place];  Service: Neurosurgery;  Laterality: N/A;   APPLICATION OF ROBOTIC ASSISTANCE FOR SPINAL PROCEDURE N/A 06/09/2018   Procedure: APPLICATION OF ROBOTIC ASSISTANCE FOR  SPINAL PROCEDURE;  Surgeon: Kristeen Miss, MD;  Location: [place];  Service: Neurosurgery;  Laterality: N/A;   BACK SURGERY     2017  discectomy   CARPAL TUNNEL RELEASE Left 07/04/2014   Procedure: LEFT CARPAL TUNNEL RELEASE;  Surgeon: Ninetta Lights, MD;  Location: [place];  Service: Orthopedics;  Laterality: Left;   CARPAL TUNNEL RELEASE Right 08/08/2014   Procedure: RIGHT CARPAL TUNNEL RELEASE;  Surgeon: Ninetta Lights, MD;  Location: [place];  Service: Orthopedics;  Laterality: Right;   DILATION AND CURETTAGE OF UTERUS     DILATION AND EVACUATION  04/02/2011   Procedure: DILATATION AND EVACUATION (D&E);  Surgeon: Luz Lex, MD;  Location: [facility];  Service: Gynecology;  Laterality: N/A;  dvt left mid thigh   DORSAL COMPARTMENT RELEASE Left 07/04/2014   Procedure: LEFT DEQUERVAINS;  Surgeon: Ninetta Lights, MD;  Location: [place];  Service: Orthopedics;  Laterality: Left;   ENDOSCOPIC PLANTAR FASCIOTOMY Left 02/01/2002   HARDWARE REMOVAL Right 06/09/2018   Procedure: Repositioning of Right Lumbar three Right lumbar  four pedicle screw with MAZOR;  Surgeon: Kristeen Miss, MD;  Location: [place];  Service: Neurosurgery;  Laterality: Right;   HYSTEROSCOPY WITH D & C  07/17/2010   with exc. endometrial polyps   KNEE ARTHROSCOPY Right 03/07/2003;  01/14/2005; 08/31/2006   KNEE ARTHROSCOPY Left 03/21/2003; 11/26/2004   LUMBAR PERCUTANEOUS PEDICLE SCREW 3 LEVEL N/A 02/17/2018   Procedure: LUMBAR PERCUTANEOUS PEDICLE SCREW PLACEMENT LUMBAR THREE-SACRAL ONE;  Surgeon: Kristeen Miss, MD;  Location: [place];  Service: Neurosurgery;  Laterality: N/A;   SHOULDER ARTHROSCOPY Right    TOE SURGERY Left 01/10/2003   claw toe correction 2nd, 3rd, 4th toes   TOTAL HIP ARTHROPLASTY Right 04/21/2016   TOTAL HIP ARTHROPLASTY Right 04/21/2016   Procedure: TOTAL HIP ARTHROPLASTY ANTERIOR APPROACH;  Surgeon: Ninetta Lights, MD;  Location: [place];  Service: Orthopedics;   Laterality: Right;   TOTAL KNEE ARTHROPLASTY Left 01/21/2010   TOTAL KNEE ARTHROPLASTY Right 08/26/2008   UPPER GI ENDOSCOPY N/A 04/14/2021   Procedure: UPPER GI ENDOSCOPY;  Surgeon: Johnathan Hausen, MD;  Location: [facility];  Service: General;  Laterality: N/A;   Patient Active Problem List   Diagnosis Date Noted   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   Flat foot 05/07/2019   Abnormality of gait 02/22/2019   Lumbosacral radiculopathy at L4 06/09/2018   Generalized anxiety disorder    Pain    Fall    Labile blood pressure    Neuropathic pain    Therapeutic opioid induced constipation    Urinary retention    Lumbar radiculopathy 02/21/2018   PCOS (polycystic ovarian syndrome)    Obesity    Depression    Generalized OA    Postoperative pain    Acute blood loss anemia    Status post surgery 02/17/2018   Lumbar radiculopathy, chronic 02/17/2018   Primary localized osteoarthritis of right hip 04/21/2016   Sinusitis 06/25/2015   Cough 06/25/2015   Essential hypertension 04/03/2015   Dehydration 04/03/2015   CAP (community acquired pneumonia) 03/31/2015   Right lower lobe pneumonia 03/31/2015   Fever 03/31/2015   Pregnancy 04/29/2014   Miscarriage    Incomplete miscarriage 04/02/2011    Class: Acute   Deep vein thrombosis (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 States she was doing her exercises for a while but now the pain is too high for her to tolerate exercise. States she has had  two injections in her back, one in June and one in July, but the pain is still there. "I cannot really bend over", "My L hip is clicking now and it worries me because I do not want any more surgeries"   PERTINENT HISTORY: Screws in both ankles (>20+ years ago), bilateral total knees (2009 and 2011), a right hip replacement (2017) and lower lumbar fusion (2019).   PAIN:  Are you having pain? Yes: NPRS scale: 8/10 Pain location: Mostly in B hips & low back Pain description: Achy, sharp in L hip  Aggravating factors: Excessive movement, exercising  Relieving factors: Heat, lidocaine patches, pain medications "My 8/10 pain is someone else's 15/10"   PRECAUTIONS: Fall  WEIGHT BEARING RESTRICTIONS No  FALLS:  Has patient fallen in last 6 months? No  LIVING ENVIRONMENT: Lives with: lives with their son and lives with  their daughter Lives in: House/apartment Stairs: No Has following equipment at home: Gilford Rile - 2 wheeled  OCCUPATION: On disability, was an Administrator for [employer]   PLOF: Independent with basic ADLs, Needs assistance with homemaking, Needs assistance with transfers, and son assists pt w/dressing and homemaking when needed   PATIENT GOALS "I wanna improve my flexibility and just aid with my mobility"    OBJECTIVE:   DIAGNOSTIC FINDINGS: MRI of Lumbar Spine on 11/22 IMPRESSION: 1. Progressive disc degeneration at L1-2 without stenosis. 2. L3-S1 fusion with widely patent spinal canal. Limited foraminal assessment due to artifact.  PATIENT SURVEYS:  LEFS 37/80  COGNITION:  Overall cognitive status: Within functional limits for tasks assessed     SENSATION: Pt reports numbness/tingling in RLE that has been present ever since her lumbar fusion   EDEMA:  Pt reports her edema has reduced since having veinous ablation   POSTURE: rounded shoulders, forward head, and posterior pelvic tilt   LOWER EXTREMITY MMT: Tested in seated position   MMT Right eval Left eval   Hip flexion 3 4-  Hip extension    Hip abduction 4- 5  Hip adduction 5 5  Hip internal rotation    Hip external rotation    Knee flexion 3+ 5  Knee extension 5 5  Ankle dorsiflexion 5 5  Ankle plantarflexion    Ankle inversion    Ankle eversion     (Blank rows = not tested)  FUNCTIONAL TESTS:   [facility] PT Assessment - 04/29/22 1006       Observation/Other Assessments   Other Surveys  Lower Extremity Functional Scale    Lower Extremity Functional Scale  37/80   46% = 54% disability     Ambulation/Gait   Gait velocity 32.8' over 9.06s =3.101f/s without AD      Standardized Balance Assessment   Standardized Balance Assessment Five Times Sit to Stand    Five times sit to stand comments  12.4 sec   no UE support            GAIT: Distance walked: Various clinic distances  Assistive device utilized: None Level of assistance: Modified independence Comments: Noted decreased hip extension on R side and increased lateral trunk lean to compensate. Wide BOS throughout     TODAY'S TREATMENT: Next Session   PATIENT EDUCATION:  Education details: Eval findings, importance of land therapy, scheduling ortho appointment for L hip to rule out potential labral tear or necrosis of head of femur (pt made appointment on 8/16 at 10am during eval)  Person educated: Patient Education method: Explanation and Demonstration Education comprehension: verbalized understanding   HOME EXERCISE PROGRAM: To be established   ASSESSMENT:  CLINICAL IMPRESSION: Patient is a 46year old female referred to Neuro [facility] for hip pain and lumbar spondylosis. Pt's PMH is significant for: screws in both ankles, bilateral total knees, a right hip replacement and lower lumbar fusion. The following deficits were present during the exam: bilateral hip pain, decreased strength, decreased activity tolerance and impaired sensation. Pt to see orthopedic on 8/16 to have L hip assessed prior to starting PT. Pt would  benefit from skilled PT to address these impairments and functional limitations to maximize functional mobility independence.     OBJECTIVE IMPAIRMENTS Abnormal gait, decreased activity tolerance, decreased endurance, decreased mobility, decreased strength, impaired sensation, and pain.   ACTIVITY LIMITATIONS carrying, lifting, bending, sitting, standing, squatting, sleeping, stairs, transfers, bed mobility, dressing, reach over head, and caring for others  PARTICIPATION LIMITATIONS:  meal prep, cleaning, laundry, interpersonal relationship, driving, shopping, community activity, occupation, and yard work  PERSONAL FACTORS Fitness, Past/current experiences, Transportation, and 1 comorbidity: new clicking/popping in L hip  are also affecting patient's functional outcome.   REHAB POTENTIAL: Good  CLINICAL DECISION MAKING: Stable/uncomplicated  EVALUATION COMPLEXITY: Low   GOALS: Goals reviewed with patient? Yes  SHORT TERM GOALS: Target date: 05/20/2022  Pt will be independent with independent HEP for improved strength and functional mobility.  Baseline: Goal status: INITIAL  2.  HiMat to be performed and LTG written Baseline:  Goal status: INITIAL   LONG TERM GOALS: Target date: 06/10/2022   Pt will be independent with final HEP for improved strength, balance, transfers and gait.  Baseline:  Goal status: INITIAL  2.  HiMat goal Baseline:  Goal status: INITIAL  3.  Pt will improve LEFS by 12 points for improved perceived functional ability  Baseline: 37/80 Goal status: INITIAL  4.  6MWT goal regarding no increase in pain  Baseline:  Goal status: INITIAL   PLAN: PT FREQUENCY: 1x/week  PT DURATION: 6 weeks  PLANNED INTERVENTIONS: Therapeutic exercises, Therapeutic activity, Neuromuscular re-education, Balance training, Gait training, Patient/Family education, Self Care, Joint mobilization, Stair training, Orthotic/Fit training, DME instructions, Aquatic Therapy,  Moist heat, Taping, Manual therapy, and Re-evaluation  PLAN FOR NEXT SESSION: HiMat, 6MWT, How was ortho appt? Establish HEP for RLE strength- hip flexion, knee extension, lateral weight shifting, stretches    Doral Digangi E Karter Hellmer, PT, DPT 04/29/2022, 11:34 AM

## 2022-05-05 DIAGNOSIS — M6281 Muscle weakness (generalized): Secondary | ICD-10-CM | POA: Diagnosis not present

## 2022-05-05 DIAGNOSIS — M25551 Pain in right hip: Secondary | ICD-10-CM | POA: Diagnosis not present

## 2022-05-05 DIAGNOSIS — J3081 Allergic rhinitis due to animal (cat) (dog) hair and dander: Secondary | ICD-10-CM | POA: Diagnosis not present

## 2022-05-05 DIAGNOSIS — M25552 Pain in left hip: Secondary | ICD-10-CM | POA: Diagnosis not present

## 2022-05-05 DIAGNOSIS — J301 Allergic rhinitis due to pollen: Secondary | ICD-10-CM | POA: Diagnosis not present

## 2022-05-05 DIAGNOSIS — J3089 Other allergic rhinitis: Secondary | ICD-10-CM | POA: Diagnosis not present

## 2022-05-07 DIAGNOSIS — M25572 Pain in left ankle and joints of left foot: Secondary | ICD-10-CM | POA: Diagnosis not present

## 2022-05-10 DIAGNOSIS — M25552 Pain in left hip: Secondary | ICD-10-CM | POA: Diagnosis not present

## 2022-05-11 ENCOUNTER — Encounter: Payer: Self-pay | Admitting: Physical Therapy

## 2022-05-11 ENCOUNTER — Ambulatory Visit: Payer: Medicare Other | Admitting: Physical Therapy

## 2022-05-11 DIAGNOSIS — G8929 Other chronic pain: Secondary | ICD-10-CM

## 2022-05-11 DIAGNOSIS — M6281 Muscle weakness (generalized): Secondary | ICD-10-CM | POA: Diagnosis not present

## 2022-05-11 DIAGNOSIS — R2689 Other abnormalities of gait and mobility: Secondary | ICD-10-CM

## 2022-05-11 DIAGNOSIS — M25552 Pain in left hip: Secondary | ICD-10-CM

## 2022-05-11 DIAGNOSIS — M545 Low back pain, unspecified: Secondary | ICD-10-CM | POA: Diagnosis not present

## 2022-05-11 NOTE — Therapy (Signed)
OUTPATIENT PHYSICAL THERAPY LOWER EXTREMITY TREATMENT   Patient Name: Ashley [name] MRN: [id number] DOB:1976-07-01, 46 y.o., female Today's Date: 05/11/2022   PT End of Session - 05/11/22 0936     Visit Number 2    Number of Visits 7   Plus eval   Date for PT Re-Evaluation 06/17/22    Authorization Type [employer] Medicare    Progress Note Due on Visit 10    PT Start Time 0935    PT Stop Time 1015    PT Time Calculation (min) 40 min    Activity Tolerance Patient tolerated treatment well    Behavior During Therapy [facility] for tasks assessed/performed              Past Medical History:  Diagnosis Date   Arthritis    knees, back   Carpal tunnel syndrome of right wrist 29/4765   Complication of anesthesia    Depression    History of pneumonia    Hypertension    Lumbar spondylosis    PCOS (polycystic ovarian syndrome)    Pneumonia    2016   PONV (postoperative nausea and vomiting)    Past Surgical History:  Procedure Laterality Date   ABDOMINAL EXPOSURE N/A 02/17/2018   Procedure: ABDOMINAL EXPOSURE;  Surgeon: Rosetta Posner, MD;  [facility];  Service: Vascular;  Laterality: N/A;   ANKLE SURGERY     x 2   ANTERIOR LAT LUMBAR FUSION N/A 02/17/2018   Procedure: Lumbar three-four Lumbar four-five  Anterolateral lumbar interbody fusion;  Surgeon: Kristeen Miss, MD;  Location: [place];  Service: Neurosurgery;  Laterality: N/A;   ANTERIOR LUMBAR FUSION N/A 02/17/2018   Procedure: Lumbar five-Sacral one Anterior lumbar interbody fusion;  Surgeon: Kristeen Miss, MD;  Location: [place];  Service: Neurosurgery;  Laterality: N/A;   APPLICATION OF ROBOTIC ASSISTANCE FOR SPINAL PROCEDURE N/A 02/17/2018   Procedure: APPLICATION OF ROBOTIC ASSISTANCE FOR SPINAL PROCEDURE;  Surgeon: Kristeen Miss, MD;  Location: [place];  Service: Neurosurgery;  Laterality: N/A;   APPLICATION OF ROBOTIC ASSISTANCE FOR SPINAL PROCEDURE N/A 06/09/2018   Procedure: APPLICATION OF ROBOTIC ASSISTANCE FOR SPINAL  PROCEDURE;  Surgeon: Kristeen Miss, MD;  Location: [place];  Service: Neurosurgery;  Laterality: N/A;   BACK SURGERY     2017  discectomy   CARPAL TUNNEL RELEASE Left 07/04/2014   Procedure: LEFT CARPAL TUNNEL RELEASE;  Surgeon: Ninetta Lights, MD;  Location: [place];  Service: Orthopedics;  Laterality: Left;   CARPAL TUNNEL RELEASE Right 08/08/2014   Procedure: RIGHT CARPAL TUNNEL RELEASE;  Surgeon: Ninetta Lights, MD;  Location: [place];  Service: Orthopedics;  Laterality: Right;   DILATION AND CURETTAGE OF UTERUS     DILATION AND EVACUATION  04/02/2011   Procedure: DILATATION AND EVACUATION (D&E);  Surgeon: Luz Lex, MD;  Location: [place] [facility];  Service: Gynecology;  Laterality: N/A;  dvt left mid thigh   DORSAL COMPARTMENT RELEASE Left 07/04/2014   Procedure: LEFT DEQUERVAINS;  Surgeon: Ninetta Lights, MD;  Location: [place];  Service: Orthopedics;  Laterality: Left;   ENDOSCOPIC PLANTAR FASCIOTOMY Left 02/01/2002   HARDWARE REMOVAL Right 06/09/2018   Procedure: Repositioning of Right Lumbar three Right lumbar  four pedicle screw with MAZOR;  Surgeon: Kristeen Miss, MD;  Location: [place];  Service: Neurosurgery;  Laterality: Right;   HYSTEROSCOPY WITH D & C  07/17/2010   with exc. endometrial polyps   KNEE ARTHROSCOPY Right 03/07/2003; 01/14/2005; 08/31/2006  KNEE ARTHROSCOPY Left 03/21/2003; 11/26/2004   LUMBAR PERCUTANEOUS PEDICLE SCREW 3 LEVEL N/A 02/17/2018   Procedure: LUMBAR PERCUTANEOUS PEDICLE SCREW PLACEMENT LUMBAR THREE-SACRAL ONE;  Surgeon: Kristeen Miss, MD;  Location: [place];  Service: Neurosurgery;  Laterality: N/A;   SHOULDER ARTHROSCOPY Right    TOE SURGERY Left 01/10/2003   claw toe correction 2nd, 3rd, 4th toes   TOTAL HIP ARTHROPLASTY Right 04/21/2016   TOTAL HIP ARTHROPLASTY Right 04/21/2016   Procedure: TOTAL HIP ARTHROPLASTY ANTERIOR APPROACH;  Surgeon: Ninetta Lights, MD;  Location: [place];  Service: Orthopedics;   Laterality: Right;   TOTAL KNEE ARTHROPLASTY Left 01/21/2010   TOTAL KNEE ARTHROPLASTY Right 08/26/2008   UPPER GI ENDOSCOPY N/A 04/14/2021   Procedure: UPPER GI ENDOSCOPY;  Surgeon: Johnathan Hausen, MD;  Location: [facility];  Service: General;  Laterality: N/A;   Patient Active Problem List   Diagnosis Date Noted   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   Flat foot 05/07/2019   Abnormality of gait 02/22/2019   Lumbosacral radiculopathy at L4 06/09/2018   Generalized anxiety disorder    Pain    Fall    Labile blood pressure    Neuropathic pain    Therapeutic opioid induced constipation    Urinary retention    Lumbar radiculopathy 02/21/2018   PCOS (polycystic ovarian syndrome)    Obesity    Depression    Generalized OA    Postoperative pain    Acute blood loss anemia    Status post surgery 02/17/2018   Lumbar radiculopathy, chronic 02/17/2018   Primary localized osteoarthritis of right hip 04/21/2016   Sinusitis 06/25/2015   Cough 06/25/2015   Essential hypertension 04/03/2015   Dehydration 04/03/2015   CAP (community acquired pneumonia) 03/31/2015   Right lower lobe pneumonia 03/31/2015   Fever 03/31/2015   Pregnancy 04/29/2014   Miscarriage    Incomplete miscarriage 04/02/2011    Class: Acute   Deep vein thrombosis (DVT) ([place]) 04/02/2011    Class: Chronic    PCP: Scifres, Dorothy, PA-C  REFERRING PROVIDER: Margaretha Sheffield, MD   REFERRING DIAG: M25.559 (ICD-10-CM) - Hip pain M47.816 (ICD-10-CM) - Spondylosis without myelopathy or radiculopathy, lumbar region   THERAPY DIAG:  Muscle weakness (generalized)  Pain in left hip  Other abnormalities of gait and mobility  Chronic right-sided low back pain without sciatica  Rationale for Evaluation and Treatment Rehabilitation  ONSET DATE: 04/21/2022   SUBJECTIVE:   SUBJECTIVE STATEMENT: Pt reports she was able to see ortho doctor and had MRI of L hip, has a labral tear. Ortho is recommending hip replacement not  repair of labrum, pt not agreeable to surgery at this time. Pt had injection into hip yesterday at 4 PM, not feeling effects just yet.  PERTINENT HISTORY: Screws in both ankles (>20+ years ago), bilateral total knees (2009 and 2011), a right hip replacement (2017) and lower lumbar fusion (2019).   PAIN:  Are you having pain? Yes: NPRS scale: 3/10 Pain location: Mostly in B hips & low back Pain description: Achy, sharp in L hip  Aggravating factors: Excessive movement, exercising  Relieving factors: Heat, lidocaine patches, pain medications   PRECAUTIONS: Fall  WEIGHT BEARING RESTRICTIONS No  FALLS:  Has patient fallen in last 6 months? No  PATIENT GOALS "I wanna improve my flexibility and just aid with my mobility"    OBJECTIVE:    TODAY'S TREATMENT:  THER ACT/FUNCTIONAL ASSESSMENTS: HiMat: 22/54  6MWT: 1412 ft with no rest breaks, RPE 2.5/10 following test, hip tightness  but no increase in pain    THER EX:   Initiated HEP, see bolded below  Attempted to have pt perform supine hip flexion, pt has onset of pain in R hip that resolves at rest. Pt able to tolerate passive stretch of R knee to chest but has onset of pain at end-range of stretch and/or with activation of hip flexors.   PATIENT EDUCATION:  Education details: initiated HEP Person educated: Patient Education method: Consulting civil engineer, Media planner, and Handouts Education comprehension: verbalized understanding   HOME EXERCISE PROGRAM: Access Code: [email or web address] URL: [email or web address] Date: 05/11/2022 Prepared by: Excell Seltzer  Exercises - Supine Lower Trunk Rotation  - 1 x daily - 7 x weekly - 3 sets - 10 reps - 5 sec hold - Supine Bridge  - 1 x daily - 7 x weekly - 3 sets - 10 reps - 5 sec hold - Clamshell  - 1 x daily - 7 x weekly - 3 sets - 10 reps - Modified Thomas Stretch  - 1 x daily - 7 x weekly - 2 sets - 5 reps - 30-60 sec hold  ASSESSMENT:  CLINICAL IMPRESSION: Emphasis of  skilled PT session on performing further functional assessments for higher level balance and endurance. Pt scores 22/54 on HiMat, indicating decreased higher level balance capabilities and is able to walk 1412 ft in 6 minutes with no rest breaks, indicating minimal endurance deficits. Initiated HEP with patient, see bolded above. Pt has onset of R hip pain with supine hip flexion, no pain with PROM except at end-range but has pain with activation of hip flexors. Pt will continue to benefit from ongoing PT services to address decreased flexibility and decreased strength leading to increased pain with functional mobility. Continue POC.   OBJECTIVE IMPAIRMENTS Abnormal gait, decreased activity tolerance, decreased endurance, decreased mobility, decreased strength, impaired sensation, and pain.   ACTIVITY LIMITATIONS carrying, lifting, bending, sitting, standing, squatting, sleeping, stairs, transfers, bed mobility, dressing, reach over head, and caring for others  PARTICIPATION LIMITATIONS: meal prep, cleaning, laundry, interpersonal relationship, driving, shopping, community activity, occupation, and yard work  PERSONAL Education officer, community, Past/current experiences, Transportation, and 1 comorbidity: new clicking/popping in L hip  are also affecting patient's functional outcome.   REHAB POTENTIAL: Good  CLINICAL DECISION MAKING: Stable/uncomplicated  EVALUATION COMPLEXITY: Low   GOALS: Goals reviewed with patient? Yes  SHORT TERM GOALS: Target date: 05/20/2022  Pt will be independent with independent HEP for improved strength and functional mobility.  Baseline: Goal status: INITIAL  2.  Pt will improve score on HiMat to 27/54 to demonstrate improved balance and decreased functional disability Baseline: 22/54 (8/22) Goal status: INITIAL   LONG TERM GOALS: Target date: 06/10/2022   Pt will be independent with final HEP for improved strength, balance, transfers and gait.  Baseline:  Goal  status: INITIAL  2.  Pt will improve score on HiMat to 32/54 to demonstrate improved balance and decreased functional disability Baseline: 22/54 (8/22) Goal status: INITIAL  3.  Pt will improve LEFS by 12 points for improved perceived functional ability  Baseline: 37/80 Goal status: INITIAL  4.  6MWT goal regarding no increase in pain  Baseline: 1412 ft with no increase in pain (8/22) Goal status: MET   PLAN: PT FREQUENCY: 1x/week  PT DURATION: 6 weeks  PLANNED INTERVENTIONS: Therapeutic exercises, Therapeutic activity, Neuromuscular re-education, Balance training, Gait training, Patient/Family education, Self Care, Joint mobilization, Stair training, Orthotic/Fit training, DME instructions, Aquatic Therapy, Moist heat, Taping, Manual therapy, and Re-evaluation  PLAN  FOR NEXT SESSION: assess initial HEP, add to HEP for RLE strength- hip flexion, knee extension, lateral weight shifting, stretches    Excell Seltzer, PT, DPT, CSRS 05/11/2022, 10:17 AM

## 2022-05-13 DIAGNOSIS — J301 Allergic rhinitis due to pollen: Secondary | ICD-10-CM | POA: Diagnosis not present

## 2022-05-13 DIAGNOSIS — J3089 Other allergic rhinitis: Secondary | ICD-10-CM | POA: Diagnosis not present

## 2022-05-13 DIAGNOSIS — J3081 Allergic rhinitis due to animal (cat) (dog) hair and dander: Secondary | ICD-10-CM | POA: Diagnosis not present

## 2022-05-14 DIAGNOSIS — M25552 Pain in left hip: Secondary | ICD-10-CM | POA: Diagnosis not present

## 2022-05-17 DIAGNOSIS — J3089 Other allergic rhinitis: Secondary | ICD-10-CM | POA: Diagnosis not present

## 2022-05-17 DIAGNOSIS — J301 Allergic rhinitis due to pollen: Secondary | ICD-10-CM | POA: Diagnosis not present

## 2022-05-17 DIAGNOSIS — J3081 Allergic rhinitis due to animal (cat) (dog) hair and dander: Secondary | ICD-10-CM | POA: Diagnosis not present

## 2022-05-19 ENCOUNTER — Ambulatory Visit: Payer: Medicare Other

## 2022-05-19 DIAGNOSIS — G8929 Other chronic pain: Secondary | ICD-10-CM | POA: Diagnosis not present

## 2022-05-19 DIAGNOSIS — M25552 Pain in left hip: Secondary | ICD-10-CM | POA: Diagnosis not present

## 2022-05-19 DIAGNOSIS — R2689 Other abnormalities of gait and mobility: Secondary | ICD-10-CM | POA: Diagnosis not present

## 2022-05-19 DIAGNOSIS — M545 Low back pain, unspecified: Secondary | ICD-10-CM

## 2022-05-19 DIAGNOSIS — M6281 Muscle weakness (generalized): Secondary | ICD-10-CM | POA: Diagnosis not present

## 2022-05-19 NOTE — Therapy (Signed)
OUTPATIENT PHYSICAL THERAPY LOWER EXTREMITY TREATMENT   Patient Name: RODERICA CATHELL MRN: [id number] DOB:1975-11-15, 46 y.o., female Today's Date: 05/19/2022   PT End of Session - 05/19/22 0931     Visit Number 3    Number of Visits 7    Date for PT Re-Evaluation 06/17/22    Authorization Type UHC Medicare    Progress Note Due on Visit 10    PT Start Time 0930    PT Stop Time 1010    PT Time Calculation (min) 40 min    Activity Tolerance Patient tolerated treatment well    Behavior During Therapy [facility] for tasks assessed/performed              Past Medical History:  Diagnosis Date   Arthritis    knees, back   Carpal tunnel syndrome of right wrist 18/8416   Complication of anesthesia    Depression    History of pneumonia    Hypertension    Lumbar spondylosis    PCOS (polycystic ovarian syndrome)    Pneumonia    2016   PONV (postoperative nausea and vomiting)    Past Surgical History:  Procedure Laterality Date   ABDOMINAL EXPOSURE N/A 02/17/2018   Procedure: ABDOMINAL EXPOSURE;  Surgeon: Rosetta Posner, MD;  [facility];  Service: Vascular;  Laterality: N/A;   ANKLE SURGERY     x 2   ANTERIOR LAT LUMBAR FUSION N/A 02/17/2018   Procedure: Lumbar three-four Lumbar four-five  Anterolateral lumbar interbody fusion;  Surgeon: Kristeen Miss, MD;  Location: [place];  Service: Neurosurgery;  Laterality: N/A;   ANTERIOR LUMBAR FUSION N/A 02/17/2018   Procedure: Lumbar five-Sacral one Anterior lumbar interbody fusion;  Surgeon: Kristeen Miss, MD;  Location: [place];  Service: Neurosurgery;  Laterality: N/A;   APPLICATION OF ROBOTIC ASSISTANCE FOR SPINAL PROCEDURE N/A 02/17/2018   Procedure: APPLICATION OF ROBOTIC ASSISTANCE FOR SPINAL PROCEDURE;  Surgeon: Kristeen Miss, MD;  Location: [place];  Service: Neurosurgery;  Laterality: N/A;   APPLICATION OF ROBOTIC ASSISTANCE FOR SPINAL PROCEDURE N/A 06/09/2018   Procedure: APPLICATION OF ROBOTIC ASSISTANCE FOR SPINAL PROCEDURE;   Surgeon: Kristeen Miss, MD;  Location: [place];  Service: Neurosurgery;  Laterality: N/A;   BACK SURGERY     2017  discectomy   CARPAL TUNNEL RELEASE Left 07/04/2014   Procedure: LEFT CARPAL TUNNEL RELEASE;  Surgeon: Ninetta Lights, MD;  Location: [place];  Service: Orthopedics;  Laterality: Left;   CARPAL TUNNEL RELEASE Right 08/08/2014   Procedure: RIGHT CARPAL TUNNEL RELEASE;  Surgeon: Ninetta Lights, MD;  Location: [place];  Service: Orthopedics;  Laterality: Right;   DILATION AND CURETTAGE OF UTERUS     DILATION AND EVACUATION  04/02/2011   Procedure: DILATATION AND EVACUATION (D&E);  Surgeon: Luz Lex, MD;  Location: [place] [facility];  Service: Gynecology;  Laterality: N/A;  dvt left mid thigh   DORSAL COMPARTMENT RELEASE Left 07/04/2014   Procedure: LEFT DEQUERVAINS;  Surgeon: Ninetta Lights, MD;  Location: [place];  Service: Orthopedics;  Laterality: Left;   ENDOSCOPIC PLANTAR FASCIOTOMY Left 02/01/2002   HARDWARE REMOVAL Right 06/09/2018   Procedure: Repositioning of Right Lumbar three Right lumbar  four pedicle screw with MAZOR;  Surgeon: Kristeen Miss, MD;  Location: [place];  Service: Neurosurgery;  Laterality: Right;   HYSTEROSCOPY WITH D & C  07/17/2010   with exc. endometrial polyps   KNEE ARTHROSCOPY Right 03/07/2003; 01/14/2005; 08/31/2006   KNEE ARTHROSCOPY  Left 03/21/2003; 11/26/2004   LUMBAR PERCUTANEOUS PEDICLE SCREW 3 LEVEL N/A 02/17/2018   Procedure: LUMBAR PERCUTANEOUS PEDICLE SCREW PLACEMENT LUMBAR THREE-SACRAL ONE;  Surgeon: Kristeen Miss, MD;  Location: [place];  Service: Neurosurgery;  Laterality: N/A;   SHOULDER ARTHROSCOPY Right    TOE SURGERY Left 01/10/2003   claw toe correction 2nd, 3rd, 4th toes   TOTAL HIP ARTHROPLASTY Right 04/21/2016   TOTAL HIP ARTHROPLASTY Right 04/21/2016   Procedure: TOTAL HIP ARTHROPLASTY ANTERIOR APPROACH;  Surgeon: Ninetta Lights, MD;  Location: [place];  Service: Orthopedics;  Laterality: Right;    TOTAL KNEE ARTHROPLASTY Left 01/21/2010   TOTAL KNEE ARTHROPLASTY Right 08/26/2008   UPPER GI ENDOSCOPY N/A 04/14/2021   Procedure: UPPER GI ENDOSCOPY;  Surgeon: Johnathan Hausen, MD;  [facility];  Service: General;  Laterality: N/A;   Patient Active Problem List   Diagnosis Date Noted   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   Flat foot 05/07/2019   Abnormality of gait 02/22/2019   Lumbosacral radiculopathy at L4 06/09/2018   Generalized anxiety disorder    Pain    Fall    Labile blood pressure    Neuropathic pain    Therapeutic opioid induced constipation    Urinary retention    Lumbar radiculopathy 02/21/2018   PCOS (polycystic ovarian syndrome)    Obesity    Depression    Generalized OA    Postoperative pain    Acute blood loss anemia    Status post surgery 02/17/2018   Lumbar radiculopathy, chronic 02/17/2018   Primary localized osteoarthritis of right hip 04/21/2016   Sinusitis 06/25/2015   Cough 06/25/2015   Essential hypertension 04/03/2015   Dehydration 04/03/2015   CAP (community acquired pneumonia) 03/31/2015   Right lower lobe pneumonia 03/31/2015   Fever 03/31/2015   Pregnancy 04/29/2014   Miscarriage    Incomplete miscarriage 04/02/2011    Class: Acute   Deep vein thrombosis (DVT) ([place]) 04/02/2011    Class: Chronic    PCP: Scifres, Dorothy, PA-C  REFERRING PROVIDER: Margaretha Sheffield, MD   REFERRING DIAG: M25.559 (ICD-10-CM) - Hip pain M47.816 (ICD-10-CM) - Spondylosis without myelopathy or radiculopathy, lumbar region   THERAPY DIAG:  Muscle weakness (generalized)  Pain in left hip  Other abnormalities of gait and mobility  Chronic right-sided low back pain without sciatica  Rationale for Evaluation and Treatment Rehabilitation  ONSET DATE: 04/21/2022   SUBJECTIVE:   SUBJECTIVE STATEMENT: Patient reports doing "same old same old." Exercises going well. Clamshells slightly uncomfortable, but no increase in pain.   PERTINENT  HISTORY: Screws in both ankles (>20+ years ago), bilateral total knees (2009 and 2011), a right hip replacement (2017) and lower lumbar fusion (2019).   PAIN:  Are you having pain? Yes: NPRS scale: 3/10 Pain location: Mostly in B hips & low back Pain description: Achy, sharp in L hip  Aggravating factors: Excessive movement, exercising  Relieving factors: Heat, lidocaine patches, pain medications   PRECAUTIONS: Fall  WEIGHT BEARING RESTRICTIONS No  FALLS:  Has patient fallen in last 6 months? No  PATIENT GOALS "I wanna improve my flexibility and just aid with my mobility"    OBJECTIVE:    TODAY'S TREATMENT:   THER EX:   -resisted hip abd bridges 2x12   -resisted hooklying marches 2x12   -posterior pelvic tilt 2x12   -TA engagement + LTR 2x12   -B sciatic nerve flossing 1x10   -ant ball roll out 2x30s   -seated hamstring stretch 2x30s  MANUAL:    -prone TPR ( L QL, R piriformis)    -ed: re: use of tennis ball or foam roller to alleviate trigger points  PATIENT EDUCATION:  Education details: use of tennis ball/foam roller, continue HEP Person educated: Patient Education method: Explanation, Demonstration, and Handouts Education comprehension: verbalized understanding   HOME EXERCISE PROGRAM: Access Code: [email or web address] URL: [email or web address] Date: 05/11/2022 Prepared by: Excell Seltzer  Exercises - Supine Lower Trunk Rotation  - 1 x daily - 7 x weekly - 3 sets - 10 reps - 5 sec hold - Supine Bridge  - 1 x daily - 7 x weekly - 3 sets - 10 reps - 5 sec hold - Clamshell  - 1 x daily - 7 x weekly - 3 sets - 10 reps - Modified Thomas Stretch  - 1 x daily - 7 x weekly - 2 sets - 5 reps - 30-60 sec hold  ASSESSMENT:  CLINICAL IMPRESSION: Patient seen for skilled PT session with emphasis on core strengthening and general stretching. Patient tolerating therex and stretching well. PT identifying increased tone in L QL and R piriformis- able to release  with deep pressure maintained. Continue POC.    OBJECTIVE IMPAIRMENTS Abnormal gait, decreased activity tolerance, decreased endurance, decreased mobility, decreased strength, impaired sensation, and pain.   ACTIVITY LIMITATIONS carrying, lifting, bending, sitting, standing, squatting, sleeping, stairs, transfers, bed mobility, dressing, reach over head, and caring for others  PARTICIPATION LIMITATIONS: meal prep, cleaning, laundry, interpersonal relationship, driving, shopping, community activity, occupation, and yard work  PERSONAL Education officer, community, Past/current experiences, Transportation, and 1 comorbidity: new clicking/popping in L hip  are also affecting patient's functional outcome.   REHAB POTENTIAL: Good  CLINICAL DECISION MAKING: Stable/uncomplicated  EVALUATION COMPLEXITY: Low   GOALS: Goals reviewed with patient? Yes  SHORT TERM GOALS: Target date: 05/20/2022  Pt will be independent with independent HEP for improved strength and functional mobility.  Baseline: Goal status: INITIAL  2.  Pt will improve score on HiMat to 27/54 to demonstrate improved balance and decreased functional disability Baseline: 22/54 (8/22) Goal status: INITIAL   LONG TERM GOALS: Target date: 06/10/2022   Pt will be independent with final HEP for improved strength, balance, transfers and gait.  Baseline:  Goal status: INITIAL  2.  Pt will improve score on HiMat to 32/54 to demonstrate improved balance and decreased functional disability Baseline: 22/54 (8/22) Goal status: INITIAL  3.  Pt will improve LEFS by 12 points for improved perceived functional ability  Baseline: 37/80 Goal status: INITIAL  4.  6MWT goal regarding no increase in pain  Baseline: 1412 ft with no increase in pain (8/22) Goal status: MET   PLAN: PT FREQUENCY: 1x/week  PT DURATION: 6 weeks  PLANNED INTERVENTIONS: Therapeutic exercises, Therapeutic activity, Neuromuscular re-education, Balance training, Gait  training, Patient/Family education, Self Care, Joint mobilization, Stair training, Orthotic/Fit training, DME instructions, Aquatic Therapy, Moist heat, Taping, Manual therapy, and Re-evaluation  PLAN FOR NEXT SESSION: assess initial HEP, add to HEP for RLE strength- hip flexion, knee extension, lateral weight shifting, stretches, ASSESS STG   Debbora Dus, PT, DPT Debbora Dus, PT, DPT, CBIS  05/19/2022, 10:19 AM

## 2022-05-25 ENCOUNTER — Ambulatory Visit: Payer: Medicare Other | Attending: Physical Medicine and Rehabilitation | Admitting: Physical Therapy

## 2022-05-25 ENCOUNTER — Ambulatory Visit: Payer: Medicare Other | Admitting: Physical Therapy

## 2022-05-25 DIAGNOSIS — M25552 Pain in left hip: Secondary | ICD-10-CM | POA: Insufficient documentation

## 2022-05-25 DIAGNOSIS — G8929 Other chronic pain: Secondary | ICD-10-CM | POA: Insufficient documentation

## 2022-05-25 DIAGNOSIS — I10 Essential (primary) hypertension: Secondary | ICD-10-CM | POA: Diagnosis not present

## 2022-05-25 DIAGNOSIS — R2689 Other abnormalities of gait and mobility: Secondary | ICD-10-CM | POA: Diagnosis not present

## 2022-05-25 DIAGNOSIS — M6281 Muscle weakness (generalized): Secondary | ICD-10-CM | POA: Insufficient documentation

## 2022-05-25 DIAGNOSIS — M545 Low back pain, unspecified: Secondary | ICD-10-CM | POA: Insufficient documentation

## 2022-05-25 DIAGNOSIS — G4700 Insomnia, unspecified: Secondary | ICD-10-CM | POA: Diagnosis not present

## 2022-05-25 NOTE — Therapy (Signed)
OUTPATIENT PHYSICAL THERAPY LOWER EXTREMITY TREATMENT   Patient Name: Ashley [name] MRN: [id number] DOB:[birth year], 46 y.o., female Today's Date: 05/25/2022   PT End of Session - 05/25/22 1320     Visit Number 4    Number of Visits 7    Date for PT Re-Evaluation 06/17/22    Authorization Type [employer] Medicare    Progress Note Due on Visit 10    PT Start Time 1318    PT Stop Time 1400    PT Time Calculation (min) 42 min    Activity Tolerance Patient tolerated treatment well;No increased pain    Behavior During Therapy WFL for tasks assessed/performed               Past Medical History:  Diagnosis Date   Arthritis    knees, back   Carpal tunnel syndrome of right wrist 44/8185   Complication of anesthesia    Depression    History of pneumonia    Hypertension    Lumbar spondylosis    PCOS (polycystic ovarian syndrome)    Pneumonia    2016   PONV (postoperative nausea and vomiting)    Past Surgical History:  Procedure Laterality Date   ABDOMINAL EXPOSURE N/A 02/17/2018   Procedure: ABDOMINAL EXPOSURE;  Surgeon: Rosetta Posner, MD;  [facility];  Service: Vascular;  Laterality: N/A;   ANKLE SURGERY     x 2   ANTERIOR LAT LUMBAR FUSION N/A 02/17/2018   Procedure: Lumbar three-four Lumbar four-five  Anterolateral lumbar interbody fusion;  Surgeon: Kristeen Miss, MD;  Location: [place];  Service: Neurosurgery;  Laterality: N/A;   ANTERIOR LUMBAR FUSION N/A 02/17/2018   Procedure: Lumbar five-Sacral one Anterior lumbar interbody fusion;  Surgeon: Kristeen Miss, MD;  Location: [place];  Service: Neurosurgery;  Laterality: N/A;   APPLICATION OF ROBOTIC ASSISTANCE FOR SPINAL PROCEDURE N/A 02/17/2018   Procedure: APPLICATION OF ROBOTIC ASSISTANCE FOR SPINAL PROCEDURE;  Surgeon: Kristeen Miss, MD;  Location: [place];  Service: Neurosurgery;  Laterality: N/A;   APPLICATION OF ROBOTIC ASSISTANCE FOR SPINAL PROCEDURE N/A 06/09/2018   Procedure: APPLICATION OF ROBOTIC ASSISTANCE FOR SPINAL  PROCEDURE;  Surgeon: Kristeen Miss, MD;  Location: [place];  Service: Neurosurgery;  Laterality: N/A;   BACK SURGERY     2017  discectomy   CARPAL TUNNEL RELEASE Left 07/04/2014   Procedure: LEFT CARPAL TUNNEL RELEASE;  Surgeon: Ninetta Lights, MD;  Location: [place];  Service: Orthopedics;  Laterality: Left;   CARPAL TUNNEL RELEASE Right 08/08/2014   Procedure: RIGHT CARPAL TUNNEL RELEASE;  Surgeon: Ninetta Lights, MD;  Location: [place];  Service: Orthopedics;  Laterality: Right;   DILATION AND CURETTAGE OF UTERUS     DILATION AND EVACUATION  04/02/2011   Procedure: DILATATION AND EVACUATION (D&E);  Surgeon: Luz Lex, MD;  Location: [facility];  Service: Gynecology;  Laterality: N/A;  dvt left mid thigh   DORSAL COMPARTMENT RELEASE Left 07/04/2014   Procedure: LEFT DEQUERVAINS;  Surgeon: Ninetta Lights, MD;  Location: [place];  Service: Orthopedics;  Laterality: Left;   ENDOSCOPIC PLANTAR FASCIOTOMY Left 02/01/2002   HARDWARE REMOVAL Right 06/09/2018   Procedure: Repositioning of Right Lumbar three Right lumbar  four pedicle screw with MAZOR;  Surgeon: Kristeen Miss, MD;  Location: [place];  Service: Neurosurgery;  Laterality: Right;   HYSTEROSCOPY WITH D & C  07/17/2010   with exc. endometrial polyps   KNEE ARTHROSCOPY Right 03/07/2003; 01/14/2005; 08/31/2006  KNEE ARTHROSCOPY Left 03/21/2003; 11/26/2004   LUMBAR PERCUTANEOUS PEDICLE SCREW 3 LEVEL N/A 02/17/2018   Procedure: LUMBAR PERCUTANEOUS PEDICLE SCREW PLACEMENT LUMBAR THREE-SACRAL ONE;  Surgeon: Kristeen Miss, MD;  Location: [place];  Service: Neurosurgery;  Laterality: N/A;   SHOULDER ARTHROSCOPY Right    TOE SURGERY Left 01/10/2003   claw toe correction 2nd, 3rd, 4th toes   TOTAL HIP ARTHROPLASTY Right 04/21/2016   TOTAL HIP ARTHROPLASTY Right 04/21/2016   Procedure: TOTAL HIP ARTHROPLASTY ANTERIOR APPROACH;  Surgeon: Ninetta Lights, MD;  Location: [place];  Service: Orthopedics;   Laterality: Right;   TOTAL KNEE ARTHROPLASTY Left 01/21/2010   TOTAL KNEE ARTHROPLASTY Right 08/26/2008   UPPER GI ENDOSCOPY N/A 04/14/2021   Procedure: UPPER GI ENDOSCOPY;  Surgeon: Johnathan Hausen, MD;  Location: [facility];  Service: General;  Laterality: N/A;   Patient Active Problem List   Diagnosis Date Noted   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   Flat foot 05/07/2019   Abnormality of gait 02/22/2019   Lumbosacral radiculopathy at L4 06/09/2018   Generalized anxiety disorder    Pain    Fall    Labile blood pressure    Neuropathic pain    Therapeutic opioid induced constipation    Urinary retention    Lumbar radiculopathy 02/21/2018   PCOS (polycystic ovarian syndrome)    Obesity    Depression    Generalized OA    Postoperative pain    Acute blood loss anemia    Status post surgery 02/17/2018   Lumbar radiculopathy, chronic 02/17/2018   Primary localized osteoarthritis of right hip 04/21/2016   Sinusitis 06/25/2015   Cough 06/25/2015   Essential hypertension 04/03/2015   Dehydration 04/03/2015   CAP (community acquired pneumonia) 03/31/2015   Right lower lobe pneumonia 03/31/2015   Fever 03/31/2015   Pregnancy 04/29/2014   Miscarriage    Incomplete miscarriage 04/02/2011    Class: Acute   Deep vein thrombosis (DVT) ([place]) 04/02/2011    Class: Chronic    PCP: Scifres, Dorothy, PA-C  REFERRING PROVIDER: Margaretha Sheffield, MD   REFERRING DIAG: M25.559 (ICD-10-CM) - Hip pain M47.816 (ICD-10-CM) - Spondylosis without myelopathy or radiculopathy, lumbar region   THERAPY DIAG:  Muscle weakness (generalized)  Pain in left hip  Other abnormalities of gait and mobility  Rationale for Evaluation and Treatment Rehabilitation  ONSET DATE: 04/21/2022   SUBJECTIVE:   SUBJECTIVE STATEMENT: Patient reports increase in low back pain today, hip is doing okay. HEP is going "so-so". No new changes   PERTINENT HISTORY: Screws in both ankles (>20+ years ago), bilateral  total knees (2009 and 2011), a right hip replacement (2017) and lower lumbar fusion (2019).   PAIN:  Are you having pain? Yes: NPRS scale: 5/10 Pain location: Mostly in B hips & low back Pain description: Achy, sharp in L hip  Aggravating factors: Excessive movement, exercising  Relieving factors: Heat, lidocaine patches, pain medications   PRECAUTIONS: Fall  WEIGHT BEARING RESTRICTIONS No  FALLS:  Has patient fallen in last 6 months? No  PATIENT GOALS "I wanna improve my flexibility and just aid with my mobility"    OBJECTIVE:    TODAY'S TREATMENT: Ther Ex: -seated ball roll outs 10x15s for improved thoracic and lumbar spine mobility and low back pain modulation  -Quadruped cat cows for improved spinal mobility and pain modulation, x15  -Quadruped child's pose to cobra, x10 w/20s hold in each pose for improved anterior hip stretch, lumbar mobility and flexibility  -Supine figure-4 stretch, 2x60s per  side. Noted significant difficulty adducting RLE > LLE to get into position.  -Supine dead bug holds, 4x10s for improved core stability. Pt with significant difficulty holding position, indicative or poor core strength.  -SciFit multi-peaks level 4 for 8 minutes using BUE/BLEs for neural priming for dynamic cardiovascular conditioning, BLE strength and thoracic mobility. Min cues for abduction of BLEs throughout. RPE of 3-4/10 and pain of 4/10 following activity.    PATIENT EDUCATION:  Education details: continue HEP, modifications to reduce CPP of L hip joint Person educated: Patient Education method: Explanation, Demonstration, and Handouts Education comprehension: verbalized understanding   HOME EXERCISE PROGRAM: Access Code: [email or web address] URL: [email or web address] Date: 05/11/2022 Prepared by: Excell Seltzer  Exercises - Supine Lower Trunk Rotation  - 1 x daily - 7 x weekly - 3 sets - 10 reps - 5 sec hold - Supine Bridge  - 1 x daily - 7 x weekly - 3 sets -  10 reps - 5 sec hold - Clamshell  - 1 x daily - 7 x weekly - 3 sets - 10 reps - Modified Thomas Stretch  - 1 x daily - 7 x weekly - 2 sets - 5 reps - 30-60 sec hold  ASSESSMENT:  CLINICAL IMPRESSION: Emphasis of skilled PT session on STG assessment, spinal mobility and global stretching. Pt has met 1/1 STGs, w/therapist discontinuing 2nd STG due to obtaining baseline score <2 weeks ago, deferred to LTG assessment. Pt demonstrated significant difficulty w/dead bugs, indicating poor core stability. Pt tolerated session well w/frequent rest breaks and modifications to reduce CPP of L hip joint. Continue POC.    OBJECTIVE IMPAIRMENTS Abnormal gait, decreased activity tolerance, decreased endurance, decreased mobility, decreased strength, impaired sensation, and pain.   ACTIVITY LIMITATIONS carrying, lifting, bending, sitting, standing, squatting, sleeping, stairs, transfers, bed mobility, dressing, reach over head, and caring for others  PARTICIPATION LIMITATIONS: meal prep, cleaning, laundry, interpersonal relationship, driving, shopping, community activity, occupation, and yard work  PERSONAL Education officer, community, Past/current experiences, Transportation, and 1 comorbidity: new clicking/popping in L hip  are also affecting patient's functional outcome.   REHAB POTENTIAL: Good  CLINICAL DECISION MAKING: Stable/uncomplicated  EVALUATION COMPLEXITY: Low   GOALS: Goals reviewed with patient? Yes  SHORT TERM GOALS: Target date: 05/20/2022  Pt will be independent with independent HEP for improved strength and functional mobility.  Baseline: Goal status: MET  2.  Pt will improve score on HiMat to 27/54 to demonstrate improved balance and decreased functional disability Baseline: 22/54 (8/22) Goal status: DISCONTINUED DUE TO OBTAINING BASELINE SCORE <2 WEEKS AGO   LONG TERM GOALS: Target date: 06/10/2022   Pt will be independent with final HEP for improved strength, balance, transfers and  gait.  Baseline:  Goal status: INITIAL  2.  Pt will improve score on HiMat to 32/54 to demonstrate improved balance and decreased functional disability Baseline: 22/54 (8/22) Goal status: INITIAL  3.  Pt will improve LEFS by 12 points for improved perceived functional ability  Baseline: 37/80 Goal status: INITIAL  4.  6MWT goal regarding no increase in pain  Baseline: 1412 ft with no increase in pain (8/22) Goal status: MET   PLAN: PT FREQUENCY: 1x/week  PT DURATION: 6 weeks  PLANNED INTERVENTIONS: Therapeutic exercises, Therapeutic activity, Neuromuscular re-education, Balance training, Gait training, Patient/Family education, Self Care, Joint mobilization, Stair training, Orthotic/Fit training, DME instructions, Aquatic Therapy, Moist heat, Taping, Manual therapy, and Re-evaluation  PLAN FOR NEXT SESSION: ADD CORE STABILITY TO HEP, thread the needles, monster walks, iron crosses, add  to HEP for RLE strength- hip flexion, knee extension, lateral weight shifting, stretches   Sion Thane E Kingston Guiles, PT, DPT 05/25/2022, 2:01 PM

## 2022-05-26 DIAGNOSIS — J301 Allergic rhinitis due to pollen: Secondary | ICD-10-CM | POA: Diagnosis not present

## 2022-05-26 DIAGNOSIS — J3081 Allergic rhinitis due to animal (cat) (dog) hair and dander: Secondary | ICD-10-CM | POA: Diagnosis not present

## 2022-05-26 DIAGNOSIS — J3089 Other allergic rhinitis: Secondary | ICD-10-CM | POA: Diagnosis not present

## 2022-06-01 ENCOUNTER — Ambulatory Visit: Payer: Medicare Other | Admitting: Physical Therapy

## 2022-06-02 DIAGNOSIS — J3081 Allergic rhinitis due to animal (cat) (dog) hair and dander: Secondary | ICD-10-CM | POA: Diagnosis not present

## 2022-06-02 DIAGNOSIS — J301 Allergic rhinitis due to pollen: Secondary | ICD-10-CM | POA: Diagnosis not present

## 2022-06-02 DIAGNOSIS — J3089 Other allergic rhinitis: Secondary | ICD-10-CM | POA: Diagnosis not present

## 2022-06-05 DIAGNOSIS — M6281 Muscle weakness (generalized): Secondary | ICD-10-CM | POA: Diagnosis not present

## 2022-06-05 DIAGNOSIS — M25551 Pain in right hip: Secondary | ICD-10-CM | POA: Diagnosis not present

## 2022-06-08 ENCOUNTER — Ambulatory Visit: Payer: Medicare Other

## 2022-06-08 DIAGNOSIS — M25552 Pain in left hip: Secondary | ICD-10-CM | POA: Diagnosis not present

## 2022-06-08 DIAGNOSIS — M6281 Muscle weakness (generalized): Secondary | ICD-10-CM

## 2022-06-08 DIAGNOSIS — R2689 Other abnormalities of gait and mobility: Secondary | ICD-10-CM

## 2022-06-08 DIAGNOSIS — G8929 Other chronic pain: Secondary | ICD-10-CM

## 2022-06-08 DIAGNOSIS — M545 Low back pain, unspecified: Secondary | ICD-10-CM | POA: Diagnosis not present

## 2022-06-08 NOTE — Therapy (Signed)
OUTPATIENT PHYSICAL THERAPY LOWER EXTREMITY TREATMENT   Patient Name: CHARNELL PEPLINSKI MRN: [id number] DOB:01/19/1976, 46 y.o., female Today's Date: 06/08/2022   PT End of Session - 06/08/22 0939     Visit Number 5    Number of Visits 7    Date for PT Re-Evaluation 06/17/22    Authorization Type [employer] Medicare    Progress Note Due on Visit 10    PT Start Time [phone]   patient late   PT Stop Time 1015    PT Time Calculation (min) 36 min    Activity Tolerance Patient tolerated treatment well;No increased pain    Behavior During Therapy [facility] for tasks assessed/performed;Flat affect               Past Medical History:  Diagnosis Date   Arthritis    knees, back   Carpal tunnel syndrome of right wrist 60/7371   Complication of anesthesia    Depression    History of pneumonia    Hypertension    Lumbar spondylosis    PCOS (polycystic ovarian syndrome)    Pneumonia    2016   PONV (postoperative nausea and vomiting)    Past Surgical History:  Procedure Laterality Date   ABDOMINAL EXPOSURE N/A 02/17/2018   Procedure: ABDOMINAL EXPOSURE;  Surgeon: Rosetta Posner, MD;  Location: [facility];  Service: Vascular;  Laterality: N/A;   ANKLE SURGERY     x 2   ANTERIOR LAT LUMBAR FUSION N/A 02/17/2018   Procedure: Lumbar three-four Lumbar four-five  Anterolateral lumbar interbody fusion;  Surgeon: Kristeen Miss, MD;  Location: [place];  Service: Neurosurgery;  Laterality: N/A;   ANTERIOR LUMBAR FUSION N/A 02/17/2018   Procedure: Lumbar five-Sacral one Anterior lumbar interbody fusion;  Surgeon: Kristeen Miss, MD;  Location: [place];  Service: Neurosurgery;  Laterality: N/A;   APPLICATION OF ROBOTIC ASSISTANCE FOR SPINAL PROCEDURE N/A 02/17/2018   Procedure: APPLICATION OF ROBOTIC ASSISTANCE FOR SPINAL PROCEDURE;  Surgeon: Kristeen Miss, MD;  Location: [place];  Service: Neurosurgery;  Laterality: N/A;   APPLICATION OF ROBOTIC ASSISTANCE FOR SPINAL PROCEDURE N/A 06/09/2018   Procedure: APPLICATION OF  ROBOTIC ASSISTANCE FOR SPINAL PROCEDURE;  Surgeon: Kristeen Miss, MD;  Location: [place];  Service: Neurosurgery;  Laterality: N/A;   BACK SURGERY     2017  discectomy   CARPAL TUNNEL RELEASE Left 07/04/2014   Procedure: LEFT CARPAL TUNNEL RELEASE;  Surgeon: Ninetta Lights, MD;  Location: [place];  Service: Orthopedics;  Laterality: Left;   CARPAL TUNNEL RELEASE Right 08/08/2014   Procedure: RIGHT CARPAL TUNNEL RELEASE;  Surgeon: Ninetta Lights, MD;  Location: [place];  Service: Orthopedics;  Laterality: Right;   DILATION AND CURETTAGE OF UTERUS     DILATION AND EVACUATION  04/02/2011   Procedure: DILATATION AND EVACUATION (D&E);  Surgeon: Luz Lex, MD;  Location: [facility];  Service: Gynecology;  Laterality: N/A;  dvt left mid thigh   DORSAL COMPARTMENT RELEASE Left 07/04/2014   Procedure: LEFT DEQUERVAINS;  Surgeon: Ninetta Lights, MD;  Location: [place];  Service: Orthopedics;  Laterality: Left;   ENDOSCOPIC PLANTAR FASCIOTOMY Left 02/01/2002   HARDWARE REMOVAL Right 06/09/2018   Procedure: Repositioning of Right Lumbar three Right lumbar  four pedicle screw with MAZOR;  Surgeon: Kristeen Miss, MD;  Location: [place];  Service: Neurosurgery;  Laterality: Right;   HYSTEROSCOPY WITH D & C  07/17/2010   with exc. endometrial polyps   KNEE ARTHROSCOPY Right  03/07/2003; 01/14/2005; 08/31/2006   KNEE ARTHROSCOPY Left 03/21/2003; 11/26/2004   LUMBAR PERCUTANEOUS PEDICLE SCREW 3 LEVEL N/A 02/17/2018   Procedure: LUMBAR PERCUTANEOUS PEDICLE SCREW PLACEMENT LUMBAR THREE-SACRAL ONE;  Surgeon: Kristeen Miss, MD;  Location: [place];  Service: Neurosurgery;  Laterality: N/A;   SHOULDER ARTHROSCOPY Right    TOE SURGERY Left 01/10/2003   claw toe correction 2nd, 3rd, 4th toes   TOTAL HIP ARTHROPLASTY Right 04/21/2016   TOTAL HIP ARTHROPLASTY Right 04/21/2016   Procedure: TOTAL HIP ARTHROPLASTY ANTERIOR APPROACH;  Surgeon: Ninetta Lights, MD;  Location: [place];   Service: Orthopedics;  Laterality: Right;   TOTAL KNEE ARTHROPLASTY Left 01/21/2010   TOTAL KNEE ARTHROPLASTY Right 08/26/2008   UPPER GI ENDOSCOPY N/A 04/14/2021   Procedure: UPPER GI ENDOSCOPY;  Surgeon: Johnathan Hausen, MD;  Location[facility];  Service: General;  Laterality: N/A;   Patient Active Problem List   Diagnosis Date Noted   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   Flat foot 05/07/2019   Abnormality of gait 02/22/2019   Lumbosacral radiculopathy at L4 06/09/2018   Generalized anxiety disorder    Pain    Fall    Labile blood pressure    Neuropathic pain    Therapeutic opioid induced constipation    Urinary retention    Lumbar radiculopathy 02/21/2018   PCOS (polycystic ovarian syndrome)    Obesity    Depression    Generalized OA    Postoperative pain    Acute blood loss anemia    Status post surgery 02/17/2018   Lumbar radiculopathy, chronic 02/17/2018   Primary localized osteoarthritis of right hip 04/21/2016   Sinusitis 06/25/2015   Cough 06/25/2015   Essential hypertension 04/03/2015   Dehydration 04/03/2015   CAP (community acquired pneumonia) 03/31/2015   Right lower lobe pneumonia 03/31/2015   Fever 03/31/2015   Pregnancy 04/29/2014   Miscarriage    Incomplete miscarriage 04/02/2011    Class: Acute   Deep vein thrombosis (DVT) ([place]) 04/02/2011    Class: Chronic    PCP: Scifres, Dorothy, PA-C  REFERRING PROVIDER: Margaretha Sheffield, MD   REFERRING DIAG: M25.559 (ICD-10-CM) - Hip pain M47.816 (ICD-10-CM) - Spondylosis without myelopathy or radiculopathy, lumbar region   THERAPY DIAG:  Muscle weakness (generalized)  Pain in left hip  Other abnormalities of gait and mobility  Chronic right-sided low back pain without sciatica  Rationale for Evaluation and Treatment Rehabilitation  ONSET DATE: 04/21/2022   SUBJECTIVE:   SUBJECTIVE STATEMENT: Patient reports doing fair. Has had a good increase in anxiety lately and rx is making her feel "off."  Patient reporting increased stiffness in low back.   PERTINENT HISTORY: Screws in both ankles (>20+ years ago), bilateral total knees (2009 and 2011), a right hip replacement (2017) and lower lumbar fusion (2019).   PAIN:  Are you having pain? Yes: NPRS scale: 5/10 Pain location: Mostly in B hips & low back Pain description: Achy, sharp in L hip  Aggravating factors: Excessive movement, exercising  Relieving factors: Heat, lidocaine patches, pain medications   PRECAUTIONS: Fall  WEIGHT BEARING RESTRICTIONS No  FALLS:  Has patient fallen in last 6 months? No  PATIENT GOALS "I wanna improve my flexibility and just aid with my mobility"    OBJECTIVE:    TODAY'S TREATMENT: Ther Ex: -seated anterior ball roll out 4x30s -> on an angle  -supine knee-chest low back stretch  -hooklying iso core 2x10 -quadruped thread the needle -seated L hip piriformis stretch 3x30s -supine R hip piriformis stretch  3x30s -prone press up on elbows (noted minimal thoracic extension  -prone hip ext B LE 2x10     PATIENT EDUCATION:  Education details: continue HEP Person educated: Patient Education method: Explanation, Demonstration, and Handouts Education comprehension: verbalized understanding   HOME EXERCISE PROGRAM: Access Code: [email or web address] URL: [email or web address] Date: 05/11/2022 Prepared by: Excell Seltzer  Exercises - Supine Lower Trunk Rotation  - 1 x daily - 7 x weekly - 3 sets - 10 reps - 5 sec hold - Supine Bridge  - 1 x daily - 7 x weekly - 3 sets - 10 reps - 5 sec hold - Clamshell  - 1 x daily - 7 x weekly - 3 sets - 10 reps - Modified Thomas Stretch  - 1 x daily - 7 x weekly - 2 sets - 5 reps - 30-60 sec hold - Supine Double Knee to Chest  - 1 x daily - 7 x weekly - 3 sets - 30s hold - Hooklying Isometric Hip Flexion  - 1 x daily - 7 x weekly - 3 sets - 10 reps  ASSESSMENT:  CLINICAL IMPRESSION: Patient seen for skilled PT session with emphasis on low back  stretching and core strengthening. Patient tolerating exercises well, but continues to demonstrate core weakness contributing to experienced back pain. Added to HEP for increased low back stretch to tolerance and iso core strengthening. Continue POC.    OBJECTIVE IMPAIRMENTS Abnormal gait, decreased activity tolerance, decreased endurance, decreased mobility, decreased strength, impaired sensation, and pain.   ACTIVITY LIMITATIONS carrying, lifting, bending, sitting, standing, squatting, sleeping, stairs, transfers, bed mobility, dressing, reach over head, and caring for others  PARTICIPATION LIMITATIONS: meal prep, cleaning, laundry, interpersonal relationship, driving, shopping, community activity, occupation, and yard work  PERSONAL Education officer, community, Past/current experiences, Transportation, and 1 comorbidity: new clicking/popping in L hip  are also affecting patient's functional outcome.   REHAB POTENTIAL: Good  CLINICAL DECISION MAKING: Stable/uncomplicated  EVALUATION COMPLEXITY: Low   GOALS: Goals reviewed with patient? Yes  SHORT TERM GOALS: Target date: 05/20/2022  Pt will be independent with independent HEP for improved strength and functional mobility.  Baseline: Goal status: MET  2.  Pt will improve score on HiMat to 27/54 to demonstrate improved balance and decreased functional disability Baseline: 22/54 (8/22) Goal status: DISCONTINUED DUE TO OBTAINING BASELINE SCORE <2 WEEKS AGO   LONG TERM GOALS: Target date: 06/10/2022   Pt will be independent with final HEP for improved strength, balance, transfers and gait.  Baseline:  Goal status: INITIAL  2.  Pt will improve score on HiMat to 32/54 to demonstrate improved balance and decreased functional disability Baseline: 22/54 (8/22) Goal status: INITIAL  3.  Pt will improve LEFS by 12 points for improved perceived functional ability  Baseline: 37/80 Goal status: INITIAL  4.  6MWT goal regarding no increase in pain   Baseline: 1412 ft with no increase in pain (8/22) Goal status: MET   PLAN: PT FREQUENCY: 1x/week  PT DURATION: 6 weeks  PLANNED INTERVENTIONS: Therapeutic exercises, Therapeutic activity, Neuromuscular re-education, Balance training, Gait training, Patient/Family education, Self Care, Joint mobilization, Stair training, Orthotic/Fit training, DME instructions, Aquatic Therapy, Moist heat, Taping, Manual therapy, and Re-evaluation  PLAN FOR NEXT SESSION: ADD CORE STABILITY TO HEP, thread the needles, monster walks, iron crosses, add to HEP for RLE strength- hip flexion, knee extension, lateral weight shifting, stretches   Debbora Dus, PT, DPT Debbora Dus, PT, DPT, CBIS  06/08/2022, 10:16 AM

## 2022-06-09 DIAGNOSIS — J301 Allergic rhinitis due to pollen: Secondary | ICD-10-CM | POA: Diagnosis not present

## 2022-06-09 DIAGNOSIS — J3081 Allergic rhinitis due to animal (cat) (dog) hair and dander: Secondary | ICD-10-CM | POA: Diagnosis not present

## 2022-06-09 DIAGNOSIS — J3089 Other allergic rhinitis: Secondary | ICD-10-CM | POA: Diagnosis not present

## 2022-06-15 ENCOUNTER — Ambulatory Visit: Payer: Medicare Other | Admitting: Physical Therapy

## 2022-06-21 DIAGNOSIS — G4700 Insomnia, unspecified: Secondary | ICD-10-CM | POA: Diagnosis not present

## 2022-06-21 DIAGNOSIS — G894 Chronic pain syndrome: Secondary | ICD-10-CM | POA: Diagnosis not present

## 2022-06-21 DIAGNOSIS — J3089 Other allergic rhinitis: Secondary | ICD-10-CM | POA: Diagnosis not present

## 2022-06-21 DIAGNOSIS — J3081 Allergic rhinitis due to animal (cat) (dog) hair and dander: Secondary | ICD-10-CM | POA: Diagnosis not present

## 2022-06-21 DIAGNOSIS — J301 Allergic rhinitis due to pollen: Secondary | ICD-10-CM | POA: Diagnosis not present

## 2022-06-21 DIAGNOSIS — M47817 Spondylosis without myelopathy or radiculopathy, lumbosacral region: Secondary | ICD-10-CM | POA: Diagnosis not present

## 2022-06-25 ENCOUNTER — Ambulatory Visit: Payer: Medicare Other | Attending: Physical Medicine and Rehabilitation

## 2022-06-25 DIAGNOSIS — R2689 Other abnormalities of gait and mobility: Secondary | ICD-10-CM | POA: Diagnosis not present

## 2022-06-25 DIAGNOSIS — M545 Low back pain, unspecified: Secondary | ICD-10-CM | POA: Insufficient documentation

## 2022-06-25 DIAGNOSIS — M6281 Muscle weakness (generalized): Secondary | ICD-10-CM

## 2022-06-25 DIAGNOSIS — G8929 Other chronic pain: Secondary | ICD-10-CM

## 2022-06-25 DIAGNOSIS — M25552 Pain in left hip: Secondary | ICD-10-CM

## 2022-06-25 NOTE — Therapy (Signed)
OUTPATIENT PHYSICAL THERAPY DISCHARGE NOTE   Patient Name: Ashley [name] MRN: [id number] DOB:Sep 11, 1976, 46 y.o., female Today's Date: 06/25/2022   PT End of Session - 06/25/22 0935     Visit Number 6    Number of Visits 7    Date for PT Re-Evaluation 06/17/22    Authorization Type [employer]    Progress Note Due on Visit 10    PT Start Time 0930    PT Stop Time 1015    PT Time Calculation (min) 45 min    Activity Tolerance Patient tolerated treatment well;No increased pain    Behavior During Therapy [facility] for tasks assessed/performed;Flat affect               Past Medical History:  Diagnosis Date   Arthritis    knees, back   Carpal tunnel syndrome of right wrist 64/3838   Complication of anesthesia    Depression    History of pneumonia    Hypertension    Lumbar spondylosis    PCOS (polycystic ovarian syndrome)    Pneumonia    2016   PONV (postoperative nausea and vomiting)    Past Surgical History:  Procedure Laterality Date   ABDOMINAL EXPOSURE N/A 02/17/2018   Procedure: ABDOMINAL EXPOSURE;  Surgeon: Rosetta Posner, MD;  [facility];  Service: Vascular;  Laterality: N/A;   ANKLE SURGERY     x 2   ANTERIOR LAT LUMBAR FUSION N/A 02/17/2018   Procedure: Lumbar three-four Lumbar four-five  Anterolateral lumbar interbody fusion;  Surgeon: Kristeen Miss, MD;  Location: [place];  Service: Neurosurgery;  Laterality: N/A;   ANTERIOR LUMBAR FUSION N/A 02/17/2018   Procedure: Lumbar five-Sacral one Anterior lumbar interbody fusion;  Surgeon: Kristeen Miss, MD;  Location: [place];  Service: Neurosurgery;  Laterality: N/A;   APPLICATION OF ROBOTIC ASSISTANCE FOR SPINAL PROCEDURE N/A 02/17/2018   Procedure: APPLICATION OF ROBOTIC ASSISTANCE FOR SPINAL PROCEDURE;  Surgeon: Kristeen Miss, MD;  Location: [place];  Service: Neurosurgery;  Laterality: N/A;   APPLICATION OF ROBOTIC ASSISTANCE FOR SPINAL PROCEDURE N/A 06/09/2018   Procedure: APPLICATION OF ROBOTIC ASSISTANCE FOR  SPINAL PROCEDURE;  Surgeon: Kristeen Miss, MD;  Location: [place];  Service: Neurosurgery;  Laterality: N/A;   BACK SURGERY     2017  discectomy   CARPAL TUNNEL RELEASE Left 07/04/2014   Procedure: LEFT CARPAL TUNNEL RELEASE;  Surgeon: Ninetta Lights, MD;  Location: [place];  Service: Orthopedics;  Laterality: Left;   CARPAL TUNNEL RELEASE Right 08/08/2014   Procedure: RIGHT CARPAL TUNNEL RELEASE;  Surgeon: Ninetta Lights, MD;  Location: [place];  Service: Orthopedics;  Laterality: Right;   DILATION AND CURETTAGE OF UTERUS     DILATION AND EVACUATION  04/02/2011   Procedure: DILATATION AND EVACUATION (D&E);  Surgeon: Luz Lex, MD;  Location: [place] [facility];  Service: Gynecology;  Laterality: N/A;  dvt left mid thigh   DORSAL COMPARTMENT RELEASE Left 07/04/2014   Procedure: LEFT DEQUERVAINS;  Surgeon: Ninetta Lights, MD;  Location: [place];  Service: Orthopedics;  Laterality: Left;   ENDOSCOPIC PLANTAR FASCIOTOMY Left 02/01/2002   HARDWARE REMOVAL Right 06/09/2018   Procedure: Repositioning of Right Lumbar three Right lumbar  four pedicle screw with MAZOR;  Surgeon: Kristeen Miss, MD;  Location: [place];  Service: Neurosurgery;  Laterality: Right;   HYSTEROSCOPY WITH D & C  07/17/2010   with exc. endometrial polyps   KNEE ARTHROSCOPY Right 03/07/2003; 01/14/2005; 08/31/2006  KNEE ARTHROSCOPY Left 03/21/2003; 11/26/2004   LUMBAR PERCUTANEOUS PEDICLE SCREW 3 LEVEL N/A 02/17/2018   Procedure: LUMBAR PERCUTANEOUS PEDICLE SCREW PLACEMENT LUMBAR THREE-SACRAL ONE;  Surgeon: Kristeen Miss, MD;  Location: [place];  Service: Neurosurgery;  Laterality: N/A;   SHOULDER ARTHROSCOPY Right    TOE SURGERY Left 01/10/2003   claw toe correction 2nd, 3rd, 4th toes   TOTAL HIP ARTHROPLASTY Right 04/21/2016   TOTAL HIP ARTHROPLASTY Right 04/21/2016   Procedure: TOTAL HIP ARTHROPLASTY ANTERIOR APPROACH;  Surgeon: Ninetta Lights, MD;  Location: [place];  Service: Orthopedics;   Laterality: Right;   TOTAL KNEE ARTHROPLASTY Left 01/21/2010   TOTAL KNEE ARTHROPLASTY Right 08/26/2008   UPPER GI ENDOSCOPY N/A 04/14/2021   Procedure: UPPER GI ENDOSCOPY;  Surgeon: Johnathan Hausen, MD;  [facility];  Service: General;  Laterality: N/A;   Patient Active Problem List   Diagnosis Date Noted   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   Flat foot 05/07/2019   Abnormality of gait 02/22/2019   Lumbosacral radiculopathy at L4 06/09/2018   Generalized anxiety disorder    Pain    Fall    Labile blood pressure    Neuropathic pain    Therapeutic opioid induced constipation    Urinary retention    Lumbar radiculopathy 02/21/2018   PCOS (polycystic ovarian syndrome)    Obesity    Depression    Generalized OA    Postoperative pain    Acute blood loss anemia    Status post surgery 02/17/2018   Lumbar radiculopathy, chronic 02/17/2018   Primary localized osteoarthritis of right hip 04/21/2016   Sinusitis 06/25/2015   Cough 06/25/2015   Essential hypertension 04/03/2015   Dehydration 04/03/2015   CAP (community acquired pneumonia) 03/31/2015   Right lower lobe pneumonia 03/31/2015   Fever 03/31/2015   Pregnancy 04/29/2014   Miscarriage    Incomplete miscarriage 04/02/2011    Class: Acute   Deep vein thrombosis (DVT) ([place]) 04/02/2011    Class: Chronic    PCP: Scifres, Dorothy, PA-C  REFERRING PROVIDER: Margaretha Sheffield, MD   REFERRING DIAG: M25.559 (ICD-10-CM) - Hip pain M47.816 (ICD-10-CM) - Spondylosis without myelopathy or radiculopathy, lumbar region   THERAPY DIAG:  Muscle weakness (generalized)  Pain in left hip  Other abnormalities of gait and mobility  Chronic right-sided low back pain without sciatica  Rationale for Evaluation and Treatment Rehabilitation  ONSET DATE: 04/21/2022   SUBJECTIVE:   SUBJECTIVE STATEMENT: I have noticed little improvement. My goal is to be able to put on lotion on my feet but I can't pick up right leg. Pt reports  she has 53 yr old daughter and it is hard to keep up with her active life style with simple activities such as bending to tie her shoes, squat down etc.  PERTINENT HISTORY: Screws in both ankles (>20+ years ago), bilateral total knees (2009 and 2011), a right hip replacement (2017) and lower lumbar fusion (2019).   PAIN:  Are you having pain? Yes: NPRS scale: 5/10 Pain location: Mostly in B hips & low back Pain description: Achy, sharp in L hip  Aggravating factors: Excessive movement, exercising  Relieving factors: Heat, lidocaine patches, pain medications   PRECAUTIONS: Fall  WEIGHT BEARING RESTRICTIONS No  FALLS:  Has patient fallen in last 6 months? No  PATIENT GOALS "I wanna improve my flexibility and just aid with my mobility"    OBJECTIVE:    TODAY'S TREATMENT: TherEx: Patient education: - pt educated on importance of exercise compliance -  we revised her HEP and gave her 2 exercises that will work on building core strength and hip strength. - developed 3 month gradual program and gave patient calendar, exercises, and also showed her how to use "Reminder" app on her phone to keep track of exercise routine every day. - pt educated to start with 3 reps of each exercise every day and add 1 rep everyday until 3 months. As she increases reps, she can do that exercise multiple times a day until she can get to her goal number.  We discussed that once she is able to achieve above exercise goal after 3 months, she can add new exercises to her routine (ie, chair squat, mini deadlifts) and work on that. Reviwed supine single knee to chest and SLR with patient: 3 reps, pt required moderate effort to do just 3 reps with R LE    PATIENT EDUCATION:  Education details: continue HEP Person educated: Patient Education method: Explanation, Demonstration, and Handouts Education comprehension: verbalized understanding   HOME EXERCISE PROGRAM: Access Code: [email or web address] URL:  [email or web address] Date: 05/11/2022 Prepared by: Excell Seltzer  Exercises - Supine Lower Trunk Rotation  - 1 x daily - 7 x weekly - 3 sets - 10 reps - 5 sec hold - Supine Bridge  - 1 x daily - 7 x weekly - 3 sets - 10 reps - 5 sec hold - Clamshell  - 1 x daily - 7 x weekly - 3 sets - 10 reps - Modified Thomas Stretch  - 1 x daily - 7 x weekly - 2 sets - 5 reps - 30-60 sec hold - Supine Double Knee to Chest  - 1 x daily - 7 x weekly - 3 sets - 30s hold - Hooklying Isometric Hip Flexion  - 1 x daily - 7 x weekly - 3 sets - 10 reps  ASSESSMENT:  CLINICAL IMPRESSION:  Patient has been seen for total of 6 sessions from 04/29/22 tp 06/25/22. Patient has made limited progress in physical therapy due to lack of compliance with HEP. Patient was given education to improve management with chronic pain by improving consistency with exercises and moderating intensity of exercises to make sure they are not painful. Pt will be discharged from skilled PT at this time.  OBJECTIVE IMPAIRMENTS Abnormal gait, decreased activity tolerance, decreased endurance, decreased mobility, decreased strength, impaired sensation, and pain.   ACTIVITY LIMITATIONS carrying, lifting, bending, sitting, standing, squatting, sleeping, stairs, transfers, bed mobility, dressing, reach over head, and caring for others  PARTICIPATION LIMITATIONS: meal prep, cleaning, laundry, interpersonal relationship, driving, shopping, community activity, occupation, and yard work  PERSONAL Education officer, community, Past/current experiences, Transportation, and 1 comorbidity: new clicking/popping in L hip  are also affecting patient's functional outcome.   REHAB POTENTIAL: Good  CLINICAL DECISION MAKING: Stable/uncomplicated  EVALUATION COMPLEXITY: Low   GOALS: Goals reviewed with patient? Yes  SHORT TERM GOALS: Target date: 05/20/2022  Pt will be independent with independent HEP for improved strength and functional  mobility.  Baseline: Goal status: MET  2.  Pt will improve score on HiMat to 27/54 to demonstrate improved balance and decreased functional disability Baseline: 22/54 (8/22) Goal status: DISCONTINUED DUE TO OBTAINING BASELINE SCORE <2 WEEKS AGO   LONG TERM GOALS: Target date: 06/10/2022   Pt will be independent with final HEP for improved strength, balance, transfers and gait.  Baseline: poor compliance Goal status: NOT MET  2.  Pt will improve score on HiMat to 32/54 to demonstrate improved balance and decreased  functional disability Baseline: 22/54 (8/22) Goal status: not assessed  3.  Pt will improve LEFS by 12 points for improved perceived functional ability  Baseline: 37/80 Goal status: not assessed  4.  6MWT goal regarding no increase in pain  Baseline: 1412 ft with no increase in pain (8/22) Goal status: MET   PLAN: [place], PT 06/25/2022, 10:28 AM

## 2022-06-30 DIAGNOSIS — J301 Allergic rhinitis due to pollen: Secondary | ICD-10-CM | POA: Diagnosis not present

## 2022-06-30 DIAGNOSIS — J3089 Other allergic rhinitis: Secondary | ICD-10-CM | POA: Diagnosis not present

## 2022-06-30 DIAGNOSIS — J3081 Allergic rhinitis due to animal (cat) (dog) hair and dander: Secondary | ICD-10-CM | POA: Diagnosis not present

## 2022-07-05 DIAGNOSIS — M6281 Muscle weakness (generalized): Secondary | ICD-10-CM | POA: Diagnosis not present

## 2022-07-05 DIAGNOSIS — M25551 Pain in right hip: Secondary | ICD-10-CM | POA: Diagnosis not present

## 2022-07-08 DIAGNOSIS — J3089 Other allergic rhinitis: Secondary | ICD-10-CM | POA: Diagnosis not present

## 2022-07-08 DIAGNOSIS — J301 Allergic rhinitis due to pollen: Secondary | ICD-10-CM | POA: Diagnosis not present

## 2022-07-08 DIAGNOSIS — J3081 Allergic rhinitis due to animal (cat) (dog) hair and dander: Secondary | ICD-10-CM | POA: Diagnosis not present

## 2022-07-13 DIAGNOSIS — J301 Allergic rhinitis due to pollen: Secondary | ICD-10-CM | POA: Diagnosis not present

## 2022-07-13 DIAGNOSIS — J3089 Other allergic rhinitis: Secondary | ICD-10-CM | POA: Diagnosis not present

## 2022-07-13 DIAGNOSIS — J3081 Allergic rhinitis due to animal (cat) (dog) hair and dander: Secondary | ICD-10-CM | POA: Diagnosis not present

## 2022-07-19 DIAGNOSIS — J3089 Other allergic rhinitis: Secondary | ICD-10-CM | POA: Diagnosis not present

## 2022-07-19 DIAGNOSIS — J3081 Allergic rhinitis due to animal (cat) (dog) hair and dander: Secondary | ICD-10-CM | POA: Diagnosis not present

## 2022-07-19 DIAGNOSIS — J301 Allergic rhinitis due to pollen: Secondary | ICD-10-CM | POA: Diagnosis not present

## 2022-07-28 DIAGNOSIS — J3081 Allergic rhinitis due to animal (cat) (dog) hair and dander: Secondary | ICD-10-CM | POA: Diagnosis not present

## 2022-07-28 DIAGNOSIS — R058 Other specified cough: Secondary | ICD-10-CM | POA: Diagnosis not present

## 2022-07-28 DIAGNOSIS — H1045 Other chronic allergic conjunctivitis: Secondary | ICD-10-CM | POA: Diagnosis not present

## 2022-07-28 DIAGNOSIS — J3089 Other allergic rhinitis: Secondary | ICD-10-CM | POA: Diagnosis not present

## 2022-07-28 DIAGNOSIS — J301 Allergic rhinitis due to pollen: Secondary | ICD-10-CM | POA: Diagnosis not present

## 2022-08-03 ENCOUNTER — Ambulatory Visit (INDEPENDENT_AMBULATORY_CARE_PROVIDER_SITE_OTHER): Payer: Medicare Other | Admitting: Podiatry

## 2022-08-03 DIAGNOSIS — L989 Disorder of the skin and subcutaneous tissue, unspecified: Secondary | ICD-10-CM | POA: Diagnosis not present

## 2022-08-03 DIAGNOSIS — J301 Allergic rhinitis due to pollen: Secondary | ICD-10-CM | POA: Diagnosis not present

## 2022-08-03 DIAGNOSIS — J3081 Allergic rhinitis due to animal (cat) (dog) hair and dander: Secondary | ICD-10-CM | POA: Diagnosis not present

## 2022-08-03 DIAGNOSIS — J3089 Other allergic rhinitis: Secondary | ICD-10-CM | POA: Diagnosis not present

## 2022-08-03 NOTE — Progress Notes (Signed)
Chief Complaint  Patient presents with   Callouses    Patient is here for callouses on right foot she states that the right foot feel like she is stepping on glass.    HPI: 46 y.o. female presenting today for new complaint of pain and tenderness associated to the lateral aspect of the right forefoot.  Pain has been ongoing for about 3 weeks now.  Denies a history of injury.  She cannot recall an incident of stepping on anything.    Past Medical History:  Diagnosis Date   Arthritis    knees, back   Carpal tunnel syndrome of right wrist 11/4740   Complication of anesthesia    Depression    History of pneumonia    Hypertension    Lumbar spondylosis    PCOS (polycystic ovarian syndrome)    Pneumonia    2016   PONV (postoperative nausea and vomiting)     Past Surgical History:  Procedure Laterality Date   ABDOMINAL EXPOSURE N/A 02/17/2018   Procedure: ABDOMINAL EXPOSURE;  Surgeon: Rosetta Posner, MD;  [facility];  Service: Vascular;  Laterality: N/A;   ANKLE SURGERY     x 2   ANTERIOR LAT LUMBAR FUSION N/A 02/17/2018   Procedure: Lumbar three-four Lumbar four-five  Anterolateral lumbar interbody fusion;  Surgeon: Kristeen Miss, MD;  Location: [place];  Service: Neurosurgery;  Laterality: N/A;   ANTERIOR LUMBAR FUSION N/A 02/17/2018   Procedure: Lumbar five-Sacral one Anterior lumbar interbody fusion;  Surgeon: Kristeen Miss, MD;  Location: [place];  Service: Neurosurgery;  Laterality: N/A;   APPLICATION OF ROBOTIC ASSISTANCE FOR SPINAL PROCEDURE N/A 02/17/2018   Procedure: APPLICATION OF ROBOTIC ASSISTANCE FOR SPINAL PROCEDURE;  Surgeon: Kristeen Miss, MD;  Location: [place];  Service: Neurosurgery;  Laterality: N/A;   APPLICATION OF ROBOTIC ASSISTANCE FOR SPINAL PROCEDURE N/A 06/09/2018   Procedure: APPLICATION OF ROBOTIC ASSISTANCE FOR SPINAL PROCEDURE;  Surgeon: Kristeen Miss, MD;  Location: [place];  Service: Neurosurgery;  Laterality: N/A;   BACK SURGERY     2017  discectomy    CARPAL TUNNEL RELEASE Left 07/04/2014   Procedure: LEFT CARPAL TUNNEL RELEASE;  Surgeon: Ninetta Lights, MD;  Location: [place];  Service: Orthopedics;  Laterality: Left;   CARPAL TUNNEL RELEASE Right 08/08/2014   Procedure: RIGHT CARPAL TUNNEL RELEASE;  Surgeon: Ninetta Lights, MD;  Location: [place];  Service: Orthopedics;  Laterality: Right;   DILATION AND CURETTAGE OF UTERUS     DILATION AND EVACUATION  04/02/2011   Procedure: DILATATION AND EVACUATION (D&E);  Surgeon: Luz Lex, MD;  Location: [place] [facility];  Service: Gynecology;  Laterality: N/A;  dvt left mid thigh   DORSAL COMPARTMENT RELEASE Left 07/04/2014   Procedure: LEFT DEQUERVAINS;  Surgeon: Ninetta Lights, MD;  Location: [place];  Service: Orthopedics;  Laterality: Left;   ENDOSCOPIC PLANTAR FASCIOTOMY Left 02/01/2002   HARDWARE REMOVAL Right 06/09/2018   Procedure: Repositioning of Right Lumbar three Right lumbar  four pedicle screw with MAZOR;  Surgeon: Kristeen Miss, MD;  Location: [place];  Service: Neurosurgery;  Laterality: Right;   HYSTEROSCOPY WITH D & C  07/17/2010   with exc. endometrial polyps   KNEE ARTHROSCOPY Right 03/07/2003; 01/14/2005; 08/31/2006   KNEE ARTHROSCOPY Left 03/21/2003; 11/26/2004   LUMBAR PERCUTANEOUS PEDICLE SCREW 3 LEVEL N/A 02/17/2018   Procedure: LUMBAR PERCUTANEOUS PEDICLE SCREW PLACEMENT LUMBAR THREE-SACRAL ONE;  Surgeon: Kristeen Miss, MD;  Location: [place];  Service:  Neurosurgery;  Laterality: N/A;   SHOULDER ARTHROSCOPY Right    TOE SURGERY Left 01/10/2003   claw toe correction 2nd, 3rd, 4th toes   TOTAL HIP ARTHROPLASTY Right 04/21/2016   TOTAL HIP ARTHROPLASTY Right 04/21/2016   Procedure: TOTAL HIP ARTHROPLASTY ANTERIOR APPROACH;  Surgeon: Ninetta Lights, MD;  Location: [place];  Service: Orthopedics;  Laterality: Right;   TOTAL KNEE ARTHROPLASTY Left 01/21/2010   TOTAL KNEE ARTHROPLASTY Right 08/26/2008   UPPER GI ENDOSCOPY N/A 04/14/2021    Procedure: UPPER GI ENDOSCOPY;  Surgeon: Johnathan Hausen, MD;  [facility];  Service: General;  Laterality: N/A;    Allergies  Allergen Reactions   Gadolinium Derivatives Nausea And Vomiting and Other (See Comments)    Pt describes this happens every time she gets gado even with slow injection   Duloxetine     Other reaction(s): insomia   Codeine Itching, Nausea Only and Other (See Comments)    Hydrocodone does not affect pt   Cymbalta [Duloxetine Hcl] Other (See Comments)    Altered mental status     Physical Exam: General: The patient is alert and oriented x3 in no acute distress.  Dermatology: Hyperkeratotic skin lesion noted to the plantar lateral aspect of the fifth MTP right foot with associated tenderness to palpation.  Vascular: Palpable pedal pulses bilaterally. Capillary refill within normal limits.  Negative for any significant edema or erythema  Neurological: Light touch and protective threshold grossly intact  Musculoskeletal Exam: No pedal deformities noted  Assessment: 1.  Porokeratosis fifth MTP right foot   Plan of Care:  1. Patient evaluated. 2.  Excisional debridement of the hyperkeratotic skin lesion was performed today using a combination of a 312 scalpel and tissue nipper.  The patient did feel some relief. 3.  Silvadene cream provided to apply daily with a Band-Aid 4.  Recommend good supportive shoes and sneakers.  Advised against going barefoot 5.  Return to clinic as needed     Edrick Kins, DPM [facility]  Dr. Edrick Kins, DPM    [address], [place] [address]                Office [phone]  Fax [phone]

## 2022-08-05 DIAGNOSIS — M25551 Pain in right hip: Secondary | ICD-10-CM | POA: Diagnosis not present

## 2022-08-05 DIAGNOSIS — M6281 Muscle weakness (generalized): Secondary | ICD-10-CM | POA: Diagnosis not present

## 2022-08-09 DIAGNOSIS — J3089 Other allergic rhinitis: Secondary | ICD-10-CM | POA: Diagnosis not present

## 2022-08-09 DIAGNOSIS — J3081 Allergic rhinitis due to animal (cat) (dog) hair and dander: Secondary | ICD-10-CM | POA: Diagnosis not present

## 2022-08-09 DIAGNOSIS — J301 Allergic rhinitis due to pollen: Secondary | ICD-10-CM | POA: Diagnosis not present

## 2022-08-23 DIAGNOSIS — J3089 Other allergic rhinitis: Secondary | ICD-10-CM | POA: Diagnosis not present

## 2022-08-23 DIAGNOSIS — J301 Allergic rhinitis due to pollen: Secondary | ICD-10-CM | POA: Diagnosis not present

## 2022-08-23 DIAGNOSIS — M47817 Spondylosis without myelopathy or radiculopathy, lumbosacral region: Secondary | ICD-10-CM | POA: Diagnosis not present

## 2022-08-23 DIAGNOSIS — J3081 Allergic rhinitis due to animal (cat) (dog) hair and dander: Secondary | ICD-10-CM | POA: Diagnosis not present

## 2022-08-23 DIAGNOSIS — G894 Chronic pain syndrome: Secondary | ICD-10-CM | POA: Diagnosis not present

## 2022-08-23 DIAGNOSIS — G4700 Insomnia, unspecified: Secondary | ICD-10-CM | POA: Diagnosis not present

## 2022-08-24 DIAGNOSIS — G4700 Insomnia, unspecified: Secondary | ICD-10-CM | POA: Diagnosis not present

## 2022-08-24 DIAGNOSIS — E7800 Pure hypercholesterolemia, unspecified: Secondary | ICD-10-CM | POA: Diagnosis not present

## 2022-08-24 DIAGNOSIS — G8929 Other chronic pain: Secondary | ICD-10-CM | POA: Diagnosis not present

## 2022-08-24 DIAGNOSIS — Z903 Acquired absence of stomach [part of]: Secondary | ICD-10-CM | POA: Diagnosis not present

## 2022-08-24 DIAGNOSIS — R61 Generalized hyperhidrosis: Secondary | ICD-10-CM | POA: Diagnosis not present

## 2022-08-24 DIAGNOSIS — I10 Essential (primary) hypertension: Secondary | ICD-10-CM | POA: Diagnosis not present

## 2022-08-24 DIAGNOSIS — Z23 Encounter for immunization: Secondary | ICD-10-CM | POA: Diagnosis not present

## 2022-08-31 DIAGNOSIS — J3089 Other allergic rhinitis: Secondary | ICD-10-CM | POA: Diagnosis not present

## 2022-08-31 DIAGNOSIS — J301 Allergic rhinitis due to pollen: Secondary | ICD-10-CM | POA: Diagnosis not present

## 2022-08-31 DIAGNOSIS — J3081 Allergic rhinitis due to animal (cat) (dog) hair and dander: Secondary | ICD-10-CM | POA: Diagnosis not present

## 2022-09-04 DIAGNOSIS — M6281 Muscle weakness (generalized): Secondary | ICD-10-CM | POA: Diagnosis not present

## 2022-09-04 DIAGNOSIS — M25551 Pain in right hip: Secondary | ICD-10-CM | POA: Diagnosis not present

## 2022-09-06 DIAGNOSIS — J301 Allergic rhinitis due to pollen: Secondary | ICD-10-CM | POA: Diagnosis not present

## 2022-09-06 DIAGNOSIS — J3089 Other allergic rhinitis: Secondary | ICD-10-CM | POA: Diagnosis not present

## 2022-09-06 DIAGNOSIS — J3081 Allergic rhinitis due to animal (cat) (dog) hair and dander: Secondary | ICD-10-CM | POA: Diagnosis not present

## 2022-09-15 ENCOUNTER — Ambulatory Visit: Payer: Medicare Other | Admitting: Podiatry

## 2022-09-15 ENCOUNTER — Ambulatory Visit (INDEPENDENT_AMBULATORY_CARE_PROVIDER_SITE_OTHER): Payer: Medicare Other | Admitting: Podiatry

## 2022-09-15 DIAGNOSIS — L989 Disorder of the skin and subcutaneous tissue, unspecified: Secondary | ICD-10-CM

## 2022-09-15 NOTE — Progress Notes (Signed)
Chief Complaint  Patient presents with   Callouses    Patient is here for right foot callous.    HPI: 46 y.o. female presenting today for new complaint of pain and tenderness associated to the lateral aspect of the right plantar forefoot.  Patient is symptomatic callus has slowly returned.  Last seen in the office 08/03/2022.  Presenting for follow-up treatment and evaluation  Past Medical History:  Diagnosis Date   Arthritis    knees, back   Carpal tunnel syndrome of right wrist 51/8841   Complication of anesthesia    Depression    History of pneumonia    Hypertension    Lumbar spondylosis    PCOS (polycystic ovarian syndrome)    Pneumonia    2016   PONV (postoperative nausea and vomiting)     Past Surgical History:  Procedure Laterality Date   ABDOMINAL EXPOSURE N/A 02/17/2018   Procedure: ABDOMINAL EXPOSURE;  Surgeon: Rosetta Posner, MD;  [facility];  Service: Vascular;  Laterality: N/A;   ANKLE SURGERY     x 2   ANTERIOR LAT LUMBAR FUSION N/A 02/17/2018   Procedure: Lumbar three-four Lumbar four-five  Anterolateral lumbar interbody fusion;  Surgeon: Kristeen Miss, MD;  Location: [place];  Service: Neurosurgery;  Laterality: N/A;   ANTERIOR LUMBAR FUSION N/A 02/17/2018   Procedure: Lumbar five-Sacral one Anterior lumbar interbody fusion;  Surgeon: Kristeen Miss, MD;  Location: [place];  Service: Neurosurgery;  Laterality: N/A;   APPLICATION OF ROBOTIC ASSISTANCE FOR SPINAL PROCEDURE N/A 02/17/2018   Procedure: APPLICATION OF ROBOTIC ASSISTANCE FOR SPINAL PROCEDURE;  Surgeon: Kristeen Miss, MD;  Location: [place];  Service: Neurosurgery;  Laterality: N/A;   APPLICATION OF ROBOTIC ASSISTANCE FOR SPINAL PROCEDURE N/A 06/09/2018   Procedure: APPLICATION OF ROBOTIC ASSISTANCE FOR SPINAL PROCEDURE;  Surgeon: Kristeen Miss, MD;  Location: [place];  Service: Neurosurgery;  Laterality: N/A;   BACK SURGERY     2017  discectomy   CARPAL TUNNEL RELEASE Left 07/04/2014   Procedure: LEFT  CARPAL TUNNEL RELEASE;  Surgeon: Ninetta Lights, MD;  Location: [place];  Service: Orthopedics;  Laterality: Left;   CARPAL TUNNEL RELEASE Right 08/08/2014   Procedure: RIGHT CARPAL TUNNEL RELEASE;  Surgeon: Ninetta Lights, MD;  Location: [place];  Service: Orthopedics;  Laterality: Right;   DILATION AND CURETTAGE OF UTERUS     DILATION AND EVACUATION  04/02/2011   Procedure: DILATATION AND EVACUATION (D&E);  Surgeon: Luz Lex, MD;  Location: [place] [facility];  Service: Gynecology;  Laterality: N/A;  dvt left mid thigh   DORSAL COMPARTMENT RELEASE Left 07/04/2014   Procedure: LEFT DEQUERVAINS;  Surgeon: Ninetta Lights, MD;  Location: [place];  Service: Orthopedics;  Laterality: Left;   ENDOSCOPIC PLANTAR FASCIOTOMY Left 02/01/2002   HARDWARE REMOVAL Right 06/09/2018   Procedure: Repositioning of Right Lumbar three Right lumbar  four pedicle screw with MAZOR;  Surgeon: Kristeen Miss, MD;  Location: [place];  Service: Neurosurgery;  Laterality: Right;   HYSTEROSCOPY WITH D & C  07/17/2010   with exc. endometrial polyps   KNEE ARTHROSCOPY Right 03/07/2003; 01/14/2005; 08/31/2006   KNEE ARTHROSCOPY Left 03/21/2003; 11/26/2004   LUMBAR PERCUTANEOUS PEDICLE SCREW 3 LEVEL N/A 02/17/2018   Procedure: LUMBAR PERCUTANEOUS PEDICLE SCREW PLACEMENT LUMBAR THREE-SACRAL ONE;  Surgeon: Kristeen Miss, MD;  Location: [place];  Service: Neurosurgery;  Laterality: N/A;   SHOULDER ARTHROSCOPY Right    TOE SURGERY Left 01/10/2003   claw  toe correction 2nd, 3rd, 4th toes   TOTAL HIP ARTHROPLASTY Right 04/21/2016   TOTAL HIP ARTHROPLASTY Right 04/21/2016   Procedure: TOTAL HIP ARTHROPLASTY ANTERIOR APPROACH;  Surgeon: Ninetta Lights, MD;  Location: [place];  Service: Orthopedics;  Laterality: Right;   TOTAL KNEE ARTHROPLASTY Left 01/21/2010   TOTAL KNEE ARTHROPLASTY Right 08/26/2008   UPPER GI ENDOSCOPY N/A 04/14/2021   Procedure: UPPER GI ENDOSCOPY;  Surgeon: Johnathan Hausen, MD;   [facility];  Service: General;  Laterality: N/A;    Allergies  Allergen Reactions   Gadolinium Derivatives Nausea And Vomiting and Other (See Comments)    Pt describes this happens every time she gets gado even with slow injection   Duloxetine     Other reaction(s): insomia   Codeine Itching, Nausea Only and Other (See Comments)    Hydrocodone does not affect pt   Cymbalta [Duloxetine Hcl] Other (See Comments)    Altered mental status     Physical Exam: General: The patient is alert and oriented x3 in no acute distress.  Dermatology: Hyperkeratotic skin lesion noted to the plantar lateral aspect of the fifth MTP right foot with associated tenderness to palpation.  Vascular: Palpable pedal pulses bilaterally. Capillary refill within normal limits.  Negative for any significant edema or erythema  Neurological: Light touch and protective threshold grossly intact  Musculoskeletal Exam: No pedal deformities noted  Assessment: 1.  Porokeratosis fifth MTP right foot -Patient evaluated. -Excisional debridement of the hyperkeratotic skin lesion was performed today using a combination of a 312 scalpel and tissue nipper.  The patient did feel some relief. -Advised against going barefoot.  Recommend good supportive shoes and sneakers  -Return to clinic as needed   Edrick Kins, DPM [facility]  Dr. Edrick Kins, DPM    [address], [place] [address]                Office [phone]  Fax [phone]

## 2022-09-22 ENCOUNTER — Ambulatory Visit: Payer: Medicare Other | Admitting: Podiatry

## 2022-09-24 DIAGNOSIS — J3081 Allergic rhinitis due to animal (cat) (dog) hair and dander: Secondary | ICD-10-CM | POA: Diagnosis not present

## 2022-09-24 DIAGNOSIS — J3089 Other allergic rhinitis: Secondary | ICD-10-CM | POA: Diagnosis not present

## 2022-09-24 DIAGNOSIS — J301 Allergic rhinitis due to pollen: Secondary | ICD-10-CM | POA: Diagnosis not present

## 2022-09-30 DIAGNOSIS — J3089 Other allergic rhinitis: Secondary | ICD-10-CM | POA: Diagnosis not present

## 2022-09-30 DIAGNOSIS — J3081 Allergic rhinitis due to animal (cat) (dog) hair and dander: Secondary | ICD-10-CM | POA: Diagnosis not present

## 2022-09-30 DIAGNOSIS — J301 Allergic rhinitis due to pollen: Secondary | ICD-10-CM | POA: Diagnosis not present

## 2022-10-05 DIAGNOSIS — J301 Allergic rhinitis due to pollen: Secondary | ICD-10-CM | POA: Diagnosis not present

## 2022-10-05 DIAGNOSIS — J3081 Allergic rhinitis due to animal (cat) (dog) hair and dander: Secondary | ICD-10-CM | POA: Diagnosis not present

## 2022-10-05 DIAGNOSIS — M25551 Pain in right hip: Secondary | ICD-10-CM | POA: Diagnosis not present

## 2022-10-05 DIAGNOSIS — J3089 Other allergic rhinitis: Secondary | ICD-10-CM | POA: Diagnosis not present

## 2022-10-05 DIAGNOSIS — M6281 Muscle weakness (generalized): Secondary | ICD-10-CM | POA: Diagnosis not present

## 2022-10-12 DIAGNOSIS — J301 Allergic rhinitis due to pollen: Secondary | ICD-10-CM | POA: Diagnosis not present

## 2022-10-12 DIAGNOSIS — J3081 Allergic rhinitis due to animal (cat) (dog) hair and dander: Secondary | ICD-10-CM | POA: Diagnosis not present

## 2022-10-12 DIAGNOSIS — J3089 Other allergic rhinitis: Secondary | ICD-10-CM | POA: Diagnosis not present

## 2022-10-19 DIAGNOSIS — J301 Allergic rhinitis due to pollen: Secondary | ICD-10-CM | POA: Diagnosis not present

## 2022-10-19 DIAGNOSIS — J3081 Allergic rhinitis due to animal (cat) (dog) hair and dander: Secondary | ICD-10-CM | POA: Diagnosis not present

## 2022-10-19 DIAGNOSIS — J3089 Other allergic rhinitis: Secondary | ICD-10-CM | POA: Diagnosis not present

## 2022-10-21 ENCOUNTER — Ambulatory Visit: Payer: Medicare Other | Admitting: Skilled Nursing Facility1

## 2022-10-21 DIAGNOSIS — M47817 Spondylosis without myelopathy or radiculopathy, lumbosacral region: Secondary | ICD-10-CM | POA: Diagnosis not present

## 2022-10-21 DIAGNOSIS — G4700 Insomnia, unspecified: Secondary | ICD-10-CM | POA: Diagnosis not present

## 2022-10-21 DIAGNOSIS — G894 Chronic pain syndrome: Secondary | ICD-10-CM | POA: Diagnosis not present

## 2022-10-28 ENCOUNTER — Encounter: Payer: Self-pay | Admitting: Skilled Nursing Facility1

## 2022-10-28 ENCOUNTER — Encounter: Payer: 59 | Attending: Surgery | Admitting: Skilled Nursing Facility1

## 2022-10-28 VITALS — Ht 71.0 in | Wt 241.0 lb

## 2022-10-28 DIAGNOSIS — E282 Polycystic ovarian syndrome: Secondary | ICD-10-CM | POA: Diagnosis not present

## 2022-10-28 DIAGNOSIS — I10 Essential (primary) hypertension: Secondary | ICD-10-CM | POA: Diagnosis not present

## 2022-10-28 DIAGNOSIS — Z713 Dietary counseling and surveillance: Secondary | ICD-10-CM | POA: Insufficient documentation

## 2022-10-28 DIAGNOSIS — E669 Obesity, unspecified: Secondary | ICD-10-CM | POA: Insufficient documentation

## 2022-10-28 NOTE — Progress Notes (Signed)
Bariatric Nutrition Follow-Up Visit Medical Nutrition Therapy   NUTRITION ASSESSMENT    Anthropometrics  Start weight at [facility]: 298 lbs (date: 07/08/2020) Today's weight: 241 pounds  Clinical  Medical Hx: HTN, PCOS Medications: see list; no longer taking amlodipine  Labs:  Notable Signs/Symptoms: chronic pain  Body Composition Scale 06/04/2021 08/12/2021 04/19/2022 10/28/2022  Current Body Weight 274.6 258.2 242 241  Total Body Fat % 43.6 42 39.9 40.1  Visceral Fat 13 12 11   $ Fat-Free Mass % 56.3 57.9 60    Total Body Water % 42.6 43.4 44.5 44.4  Muscle-Mass lbs 37.3 37.1 37.1   BMI 38.1 35.8 33.2   Body Fat Displacement             Torso  lbs 74.2 67.2 59.9          Left Leg  lbs 14.8 13.4 11.9          Right Leg  lbs 14.8 13.4 11.9          Left Arm  lbs 7.4 6.7 5.9          Right Arm   lbs 7.4 6.7 5.9      Lifestyle & Dietary Hx   Pt states she has hip and back pain stating the only thing to do about it is surgery.  Pt states she wants to work on [place].  Pt states she recognizes her diet could be better not meal skipping, more water, and increasing fruits and vegetables and increasing physical activity    Pt states she has been in [employer].  Pt states grocery shopping is too expensive stating she is on a fixed income.  Pt states she might be visiting a food pantry but does not know of one: Dietitian offered [employer] as an option.   Estimated daily fluid intake: 64+ oz Estimated daily protein intake: 70+ g Supplements: multi and (inconsistent) calcium  Current average weekly physical activity: ADL's due to back and hip pain  24-Hr Dietary Recall First Meal: skipped Snack:  packet of nuts Second Meal: half big mac and fries Snack: fruit snack (not whole fruit) Third Meal: pre-made chicken pot pie or sausage dip (cream cheese)  Snack: eating nuts in the middle of the night Beverages: cirkle water, lemonade, juice, soda  Post-Op Goals/ Signs/  Symptoms Using straws: no Drinking while eating: no Chewing/swallowing difficulties: no Changes in vision: no Changes to mood/headaches: no Hair loss/changes to skin/nails: no Difficulty focusing/concentrating: no Sweating: no Dizziness/lightheadedness: no Palpitations: no  Carbonated/caffeinated beverages: no N/V/D/C/Gas: no Abdominal pain: no Dumping syndrome: no    NUTRITION DIAGNOSIS  Overweight/obesity ([place]-3.3) related to past poor dietary habits and physical inactivity as evidenced by completed bariatric surgery and following dietary guidelines for continued weight loss and healthy nutrition status.     NUTRITION INTERVENTION Nutrition counseling (C-1) and education (E-2) to facilitate bariatric surgery goals, including:  The importance of consuming adequate calories as well as certain nutrients daily due to the body's need for essential vitamins, minerals, and fats The importance of daily physical activity and to reach a goal of at least 150 minutes of moderate to vigorous physical activity weekly (or as directed by their physician) due to benefits such as increased musculature and improved lab values The importance of intuitive eating specifically learning hunger-satiety cues and understanding the importance of learning a new body: The importance of mindful eating to avoid grazing behaviors  Educated pt on emotional eating, how to identify, and strategies  to reduce the behavior  Importance of vegetables To have an overall healthy diet, adult men and women are recommended to consume anywhere from 2-3 cups of vegetables daily. Vegetables provide a wide range of vitamins and minerals such as vitamin A, vitamin C, potassium, and folic acid. According to the [facility], including fruit and vegetables daily may reduce the risk of cardiovascular disease, certain cancers, and other non-communicable diseases. Pt is encouraged to consume less processed foods, as they have  the potential to negatively affect overall health. Processed foods often destroy or remove nutrients from the product and/or have added salt, sugar, and saturated fats. Although not all processed foods are a concern, it is advised to have the majority of our foods be unprocessed or minimally processed as opposed to processed and ultra-processed. An example of this would be a whole apple (unprocessed), prepackaged apple slices with no additives (minimally processed), unsweetened applesauce (processed), and sweetened applesauce or apple juice with high fructose corn syrup (ultra-processed). For further information please visit https://[email or web address].[employer].dk.  Goals: Try yoga Bring a healthy snack with you when you pick up your daughter  Get back to eating breakfast  Avoid fast food  Handouts Provided Include  1 year handout   Learning Style & Readiness for Change Teaching method utilized: Visual & Auditory  Demonstrated degree of understanding via: Teach Back  Readiness Level: action Barriers to learning/adherence to lifestyle change: stress    MONITORING & EVALUATION Dietary intake, weekly physical activity, body weight Next Steps Patient is to follow-up

## 2022-10-29 DIAGNOSIS — J301 Allergic rhinitis due to pollen: Secondary | ICD-10-CM | POA: Diagnosis not present

## 2022-10-29 DIAGNOSIS — J3089 Other allergic rhinitis: Secondary | ICD-10-CM | POA: Diagnosis not present

## 2022-10-29 DIAGNOSIS — J3081 Allergic rhinitis due to animal (cat) (dog) hair and dander: Secondary | ICD-10-CM | POA: Diagnosis not present

## 2022-11-02 DIAGNOSIS — J301 Allergic rhinitis due to pollen: Secondary | ICD-10-CM | POA: Diagnosis not present

## 2022-11-02 DIAGNOSIS — J3089 Other allergic rhinitis: Secondary | ICD-10-CM | POA: Diagnosis not present

## 2022-11-02 DIAGNOSIS — J3081 Allergic rhinitis due to animal (cat) (dog) hair and dander: Secondary | ICD-10-CM | POA: Diagnosis not present

## 2022-11-05 DIAGNOSIS — M25551 Pain in right hip: Secondary | ICD-10-CM | POA: Diagnosis not present

## 2022-11-05 DIAGNOSIS — M6281 Muscle weakness (generalized): Secondary | ICD-10-CM | POA: Diagnosis not present

## 2022-11-11 DIAGNOSIS — J3089 Other allergic rhinitis: Secondary | ICD-10-CM | POA: Diagnosis not present

## 2022-11-11 DIAGNOSIS — J301 Allergic rhinitis due to pollen: Secondary | ICD-10-CM | POA: Diagnosis not present

## 2022-11-11 DIAGNOSIS — J3081 Allergic rhinitis due to animal (cat) (dog) hair and dander: Secondary | ICD-10-CM | POA: Diagnosis not present

## 2022-11-12 ENCOUNTER — Encounter (HOSPITAL_COMMUNITY): Payer: Self-pay | Admitting: *Deleted

## 2022-11-17 DIAGNOSIS — J3089 Other allergic rhinitis: Secondary | ICD-10-CM | POA: Diagnosis not present

## 2022-11-17 DIAGNOSIS — J3081 Allergic rhinitis due to animal (cat) (dog) hair and dander: Secondary | ICD-10-CM | POA: Diagnosis not present

## 2022-11-17 DIAGNOSIS — J301 Allergic rhinitis due to pollen: Secondary | ICD-10-CM | POA: Diagnosis not present

## 2022-11-19 DIAGNOSIS — M6281 Muscle weakness (generalized): Secondary | ICD-10-CM | POA: Diagnosis not present

## 2022-11-19 DIAGNOSIS — M25551 Pain in right hip: Secondary | ICD-10-CM | POA: Diagnosis not present

## 2022-11-23 DIAGNOSIS — J301 Allergic rhinitis due to pollen: Secondary | ICD-10-CM | POA: Diagnosis not present

## 2022-11-23 DIAGNOSIS — J3089 Other allergic rhinitis: Secondary | ICD-10-CM | POA: Diagnosis not present

## 2022-11-23 DIAGNOSIS — J3081 Allergic rhinitis due to animal (cat) (dog) hair and dander: Secondary | ICD-10-CM | POA: Diagnosis not present

## 2022-11-26 DIAGNOSIS — J301 Allergic rhinitis due to pollen: Secondary | ICD-10-CM | POA: Diagnosis not present

## 2022-11-26 DIAGNOSIS — J3089 Other allergic rhinitis: Secondary | ICD-10-CM | POA: Diagnosis not present

## 2022-11-26 DIAGNOSIS — J3081 Allergic rhinitis due to animal (cat) (dog) hair and dander: Secondary | ICD-10-CM | POA: Diagnosis not present

## 2022-12-03 DIAGNOSIS — J301 Allergic rhinitis due to pollen: Secondary | ICD-10-CM | POA: Diagnosis not present

## 2022-12-03 DIAGNOSIS — J3089 Other allergic rhinitis: Secondary | ICD-10-CM | POA: Diagnosis not present

## 2022-12-03 DIAGNOSIS — J3081 Allergic rhinitis due to animal (cat) (dog) hair and dander: Secondary | ICD-10-CM | POA: Diagnosis not present

## 2022-12-07 DIAGNOSIS — J3089 Other allergic rhinitis: Secondary | ICD-10-CM | POA: Diagnosis not present

## 2022-12-07 DIAGNOSIS — J3081 Allergic rhinitis due to animal (cat) (dog) hair and dander: Secondary | ICD-10-CM | POA: Diagnosis not present

## 2022-12-07 DIAGNOSIS — J301 Allergic rhinitis due to pollen: Secondary | ICD-10-CM | POA: Diagnosis not present

## 2022-12-09 DIAGNOSIS — Z1231 Encounter for screening mammogram for malignant neoplasm of breast: Secondary | ICD-10-CM | POA: Diagnosis not present

## 2022-12-14 DIAGNOSIS — J301 Allergic rhinitis due to pollen: Secondary | ICD-10-CM | POA: Diagnosis not present

## 2022-12-14 DIAGNOSIS — J3089 Other allergic rhinitis: Secondary | ICD-10-CM | POA: Diagnosis not present

## 2022-12-14 DIAGNOSIS — J3081 Allergic rhinitis due to animal (cat) (dog) hair and dander: Secondary | ICD-10-CM | POA: Diagnosis not present

## 2022-12-15 DIAGNOSIS — M47816 Spondylosis without myelopathy or radiculopathy, lumbar region: Secondary | ICD-10-CM | POA: Diagnosis not present

## 2022-12-16 DIAGNOSIS — G4700 Insomnia, unspecified: Secondary | ICD-10-CM | POA: Diagnosis not present

## 2022-12-16 DIAGNOSIS — M47817 Spondylosis without myelopathy or radiculopathy, lumbosacral region: Secondary | ICD-10-CM | POA: Diagnosis not present

## 2022-12-16 DIAGNOSIS — G894 Chronic pain syndrome: Secondary | ICD-10-CM | POA: Diagnosis not present

## 2022-12-22 DIAGNOSIS — J301 Allergic rhinitis due to pollen: Secondary | ICD-10-CM | POA: Diagnosis not present

## 2022-12-22 DIAGNOSIS — Z903 Acquired absence of stomach [part of]: Secondary | ICD-10-CM | POA: Diagnosis not present

## 2022-12-22 DIAGNOSIS — I10 Essential (primary) hypertension: Secondary | ICD-10-CM | POA: Diagnosis not present

## 2022-12-22 DIAGNOSIS — J3081 Allergic rhinitis due to animal (cat) (dog) hair and dander: Secondary | ICD-10-CM | POA: Diagnosis not present

## 2022-12-22 DIAGNOSIS — J3089 Other allergic rhinitis: Secondary | ICD-10-CM | POA: Diagnosis not present

## 2022-12-28 DIAGNOSIS — J3081 Allergic rhinitis due to animal (cat) (dog) hair and dander: Secondary | ICD-10-CM | POA: Diagnosis not present

## 2022-12-28 DIAGNOSIS — J301 Allergic rhinitis due to pollen: Secondary | ICD-10-CM | POA: Diagnosis not present

## 2022-12-28 DIAGNOSIS — J3089 Other allergic rhinitis: Secondary | ICD-10-CM | POA: Diagnosis not present

## 2022-12-30 DIAGNOSIS — Z903 Acquired absence of stomach [part of]: Secondary | ICD-10-CM | POA: Diagnosis not present

## 2022-12-30 DIAGNOSIS — I10 Essential (primary) hypertension: Secondary | ICD-10-CM | POA: Diagnosis not present

## 2023-01-04 DIAGNOSIS — J3081 Allergic rhinitis due to animal (cat) (dog) hair and dander: Secondary | ICD-10-CM | POA: Diagnosis not present

## 2023-01-04 DIAGNOSIS — J301 Allergic rhinitis due to pollen: Secondary | ICD-10-CM | POA: Diagnosis not present

## 2023-01-04 DIAGNOSIS — J3089 Other allergic rhinitis: Secondary | ICD-10-CM | POA: Diagnosis not present

## 2023-01-06 DIAGNOSIS — M5116 Intervertebral disc disorders with radiculopathy, lumbar region: Secondary | ICD-10-CM | POA: Diagnosis not present

## 2023-01-06 DIAGNOSIS — M5416 Radiculopathy, lumbar region: Secondary | ICD-10-CM | POA: Diagnosis not present

## 2023-01-10 DIAGNOSIS — J3081 Allergic rhinitis due to animal (cat) (dog) hair and dander: Secondary | ICD-10-CM | POA: Diagnosis not present

## 2023-01-10 DIAGNOSIS — J3089 Other allergic rhinitis: Secondary | ICD-10-CM | POA: Diagnosis not present

## 2023-01-10 DIAGNOSIS — J301 Allergic rhinitis due to pollen: Secondary | ICD-10-CM | POA: Diagnosis not present

## 2023-01-18 DIAGNOSIS — J3089 Other allergic rhinitis: Secondary | ICD-10-CM | POA: Diagnosis not present

## 2023-01-18 DIAGNOSIS — J3081 Allergic rhinitis due to animal (cat) (dog) hair and dander: Secondary | ICD-10-CM | POA: Diagnosis not present

## 2023-01-18 DIAGNOSIS — J301 Allergic rhinitis due to pollen: Secondary | ICD-10-CM | POA: Diagnosis not present

## 2023-01-25 DIAGNOSIS — J3081 Allergic rhinitis due to animal (cat) (dog) hair and dander: Secondary | ICD-10-CM | POA: Diagnosis not present

## 2023-01-25 DIAGNOSIS — J301 Allergic rhinitis due to pollen: Secondary | ICD-10-CM | POA: Diagnosis not present

## 2023-01-25 DIAGNOSIS — J3089 Other allergic rhinitis: Secondary | ICD-10-CM | POA: Diagnosis not present

## 2023-01-31 DIAGNOSIS — J3089 Other allergic rhinitis: Secondary | ICD-10-CM | POA: Diagnosis not present

## 2023-01-31 DIAGNOSIS — J3081 Allergic rhinitis due to animal (cat) (dog) hair and dander: Secondary | ICD-10-CM | POA: Diagnosis not present

## 2023-01-31 DIAGNOSIS — J301 Allergic rhinitis due to pollen: Secondary | ICD-10-CM | POA: Diagnosis not present

## 2023-02-07 DIAGNOSIS — J3081 Allergic rhinitis due to animal (cat) (dog) hair and dander: Secondary | ICD-10-CM | POA: Diagnosis not present

## 2023-02-07 DIAGNOSIS — J3089 Other allergic rhinitis: Secondary | ICD-10-CM | POA: Diagnosis not present

## 2023-02-07 DIAGNOSIS — J301 Allergic rhinitis due to pollen: Secondary | ICD-10-CM | POA: Diagnosis not present

## 2023-02-10 DIAGNOSIS — G4700 Insomnia, unspecified: Secondary | ICD-10-CM | POA: Diagnosis not present

## 2023-02-10 DIAGNOSIS — M47817 Spondylosis without myelopathy or radiculopathy, lumbosacral region: Secondary | ICD-10-CM | POA: Diagnosis not present

## 2023-02-10 DIAGNOSIS — G894 Chronic pain syndrome: Secondary | ICD-10-CM | POA: Diagnosis not present

## 2023-02-15 DIAGNOSIS — J3089 Other allergic rhinitis: Secondary | ICD-10-CM | POA: Diagnosis not present

## 2023-02-15 DIAGNOSIS — J3081 Allergic rhinitis due to animal (cat) (dog) hair and dander: Secondary | ICD-10-CM | POA: Diagnosis not present

## 2023-02-15 DIAGNOSIS — J301 Allergic rhinitis due to pollen: Secondary | ICD-10-CM | POA: Diagnosis not present

## 2023-02-16 DIAGNOSIS — E7800 Pure hypercholesterolemia, unspecified: Secondary | ICD-10-CM | POA: Diagnosis not present

## 2023-02-16 DIAGNOSIS — I10 Essential (primary) hypertension: Secondary | ICD-10-CM | POA: Diagnosis not present

## 2023-02-16 DIAGNOSIS — Z903 Acquired absence of stomach [part of]: Secondary | ICD-10-CM | POA: Diagnosis not present

## 2023-02-16 DIAGNOSIS — G4700 Insomnia, unspecified: Secondary | ICD-10-CM | POA: Diagnosis not present

## 2023-02-16 DIAGNOSIS — R61 Generalized hyperhidrosis: Secondary | ICD-10-CM | POA: Diagnosis not present

## 2023-02-16 DIAGNOSIS — J302 Other seasonal allergic rhinitis: Secondary | ICD-10-CM | POA: Diagnosis not present

## 2023-02-16 DIAGNOSIS — Z0000 Encounter for general adult medical examination without abnormal findings: Secondary | ICD-10-CM | POA: Diagnosis not present

## 2023-02-16 DIAGNOSIS — G8929 Other chronic pain: Secondary | ICD-10-CM | POA: Diagnosis not present

## 2023-02-16 DIAGNOSIS — D649 Anemia, unspecified: Secondary | ICD-10-CM | POA: Diagnosis not present

## 2023-02-16 DIAGNOSIS — Z136 Encounter for screening for cardiovascular disorders: Secondary | ICD-10-CM | POA: Diagnosis not present

## 2023-02-16 DIAGNOSIS — M4306 Spondylolysis, lumbar region: Secondary | ICD-10-CM | POA: Diagnosis not present

## 2023-02-22 DIAGNOSIS — I10 Essential (primary) hypertension: Secondary | ICD-10-CM | POA: Diagnosis not present

## 2023-02-22 DIAGNOSIS — J3089 Other allergic rhinitis: Secondary | ICD-10-CM | POA: Diagnosis not present

## 2023-02-22 DIAGNOSIS — R42 Dizziness and giddiness: Secondary | ICD-10-CM | POA: Diagnosis not present

## 2023-02-22 DIAGNOSIS — J3081 Allergic rhinitis due to animal (cat) (dog) hair and dander: Secondary | ICD-10-CM | POA: Diagnosis not present

## 2023-02-22 DIAGNOSIS — J301 Allergic rhinitis due to pollen: Secondary | ICD-10-CM | POA: Diagnosis not present

## 2023-02-22 DIAGNOSIS — R609 Edema, unspecified: Secondary | ICD-10-CM | POA: Diagnosis not present

## 2023-02-22 DIAGNOSIS — G4489 Other headache syndrome: Secondary | ICD-10-CM | POA: Diagnosis not present

## 2023-02-22 DIAGNOSIS — R002 Palpitations: Secondary | ICD-10-CM | POA: Diagnosis not present

## 2023-02-24 DIAGNOSIS — I10 Essential (primary) hypertension: Secondary | ICD-10-CM | POA: Diagnosis not present

## 2023-02-24 DIAGNOSIS — R000 Tachycardia, unspecified: Secondary | ICD-10-CM | POA: Diagnosis not present

## 2023-02-24 DIAGNOSIS — Z8249 Family history of ischemic heart disease and other diseases of the circulatory system: Secondary | ICD-10-CM | POA: Diagnosis not present

## 2023-03-07 DIAGNOSIS — J3089 Other allergic rhinitis: Secondary | ICD-10-CM | POA: Diagnosis not present

## 2023-03-07 DIAGNOSIS — J301 Allergic rhinitis due to pollen: Secondary | ICD-10-CM | POA: Diagnosis not present

## 2023-03-07 DIAGNOSIS — J3081 Allergic rhinitis due to animal (cat) (dog) hair and dander: Secondary | ICD-10-CM | POA: Diagnosis not present

## 2023-03-14 DIAGNOSIS — J3089 Other allergic rhinitis: Secondary | ICD-10-CM | POA: Diagnosis not present

## 2023-03-14 DIAGNOSIS — J301 Allergic rhinitis due to pollen: Secondary | ICD-10-CM | POA: Diagnosis not present

## 2023-03-14 DIAGNOSIS — J3081 Allergic rhinitis due to animal (cat) (dog) hair and dander: Secondary | ICD-10-CM | POA: Diagnosis not present

## 2023-03-17 ENCOUNTER — Ambulatory Visit: Payer: 59 | Admitting: Cardiology

## 2023-03-17 ENCOUNTER — Encounter: Payer: Self-pay | Admitting: Cardiology

## 2023-03-17 VITALS — BP 115/77 | HR 80 | Ht 71.0 in | Wt 243.0 lb

## 2023-03-17 DIAGNOSIS — Z86718 Personal history of other venous thrombosis and embolism: Secondary | ICD-10-CM | POA: Diagnosis not present

## 2023-03-17 DIAGNOSIS — R000 Tachycardia, unspecified: Secondary | ICD-10-CM | POA: Diagnosis not present

## 2023-03-17 DIAGNOSIS — I10 Essential (primary) hypertension: Secondary | ICD-10-CM

## 2023-03-17 DIAGNOSIS — Z8249 Family history of ischemic heart disease and other diseases of the circulatory system: Secondary | ICD-10-CM

## 2023-03-17 DIAGNOSIS — E782 Mixed hyperlipidemia: Secondary | ICD-10-CM

## 2023-03-17 DIAGNOSIS — R0602 Shortness of breath: Secondary | ICD-10-CM

## 2023-03-17 NOTE — Progress Notes (Signed)
ID:  NYLAN NAKATANI, DOB Aug 27, 1976, MRN [id number]  PCP:  Clementeen Graham, PA-C (Inactive)  Cardiologist:  Tessa Lerner, DO, [facility] (established care 03/17/23)  REASON FOR CONSULT: Hypertensive urgency/tachycardia/family history of heart disease  REQUESTING PHYSICIAN:  Ceasar Lund, PA [address] [place],  [place] [address]  Chief Complaint  Patient presents with   Tachycardia   New Patient (Initial Visit)    HPI  Ashley [name] is a 47 y.o. African-American female who presents to the clinic for evaluation of hypertensive urgency/tachycardia/family history of heart disease at the request of Turmel, Caleb P, PA. Her past medical history and cardiovascular risk factors include: Hypertension, PCOS, general anxiety disorder, family history of heart disease, hypercholesterolemia, obesity due to excess calories, status post gastric sleeve, history of DVT, aortic atherosclerosis.   Patient presents to the office for evaluation of hypertensive urgency/tachycardia/family history of heart disease.  She has been having intermittent episodes of palpitations associated with shortness of breath.  Most recent one being on February 22, 2023.  She is experiencing elevated heart rates she uses a Fitbit and her pulse was 134bpm, associated with shortness of breath, she called EMS.   EMS providers checked her vitals and initial blood pressure was 210/100, blood glucose of 111, she did not go to the ER at that time.  She followed up with PCP the following day she was started on amlodipine 5 mg p.o. daily.  She was already on losartan/HCTZ.  She is now referred to cardiology for further evaluation and management.  No active chest pain or heart failure symptoms.  Dad - died at age of 65 had HF, ESRD on dialysis, smoker.  Sister - heart transplant, died in 04/05/15 when she was in her 30s. Patient does not know much detail.  Sister - diagnosis of heart failure in Apr 04, 2022, alive.  Strong family  history of heart disease on her father's side.   FUNCTIONAL STATUS: No structured exercise program or daily routine. Limited from prior orthopedic surgeries.    CARDIAC DATABASE: EKG: March 17, 2023: Sinus rhythm, 80 bpm, without underlying ischemia or injury pattern.  Echocardiogram: No results found for this or any previous visit from the past 1095 days.   Stress Testing: No results found for this or any previous visit from the past 1095 days.  Heart Catheterization: None   RADIOLOGY: CT LUMBAR SPINE WO CONTRAST 16-Jul-2019Aortic atherosclerosis See report for additional details.   ALLERGIES: Allergies  Allergen Reactions   Gadolinium Derivatives Nausea And Vomiting and Other (See Comments)    Pt describes this happens every time she gets gado even with slow injection  Other Reaction(s): Other (See Comments)   Codeine Itching, Nausea Only and Other (See Comments)    Hydrocodone does not affect pt   Duloxetine     Other reaction(s): insomia  Other Reaction(s): Other (See Comments)  Other reaction(s): insomia  Altered mental status   Duloxetine Hcl Other (See Comments)    Altered mental status  Other Reaction(s): depression, other    MEDICATION LIST PRIOR TO VISIT: Current Meds  Medication Sig   Albuterol Sulfate (PROAIR RESPICLICK) 108 (90 Base) MCG/ACT AEPB Inhale 1 puff into the lungs every 4 (four) hours as needed.   amLODipine (NORVASC) 5 MG tablet    baclofen (LIORESAL) 10 MG tablet Take 3 tablets (30 mg total) by mouth 3 (three) times daily. (Patient taking differently: Take 10 mg by mouth 3 (three) times daily.)   baclofen (  LIORESAL) 10 MG tablet Take by mouth.   buPROPion (WELLBUTRIN XL) 300 MG 24 hr tablet Take 300 mg by mouth daily.   cetirizine (ZYRTEC) 10 MG tablet Take 10 mg by mouth daily as needed for allergies.    docusate sodium (COLACE) 100 MG capsule 1 capsule as needed for mild constipation   EPINEPHrine 0.3 mg/0.3 mL IJ SOAJ injection Inject  3 mLs into the muscle once.   fluticasone (FLONASE) 50 MCG/ACT nasal spray Place 1 spray into both nostrils daily as needed for allergies.   furosemide (LASIX) 20 MG tablet Take 20 mg by mouth daily as needed for edema.   guaiFENesin (MUCINEX) 600 MG 12 hr tablet Take 1 tablet (600 mg total) by mouth 2 (two) times daily as needed for cough or to loosen phlegm.   hydrocortisone-pramoxine (ANALPRAM-HC) 2.5-1 % rectal cream SMARTSIG:1 Application Rectally 3 Times Daily PRN   levocetirizine (XYZAL ALLERGY 24HR) 5 MG tablet 1 tablet in the evening   lidocaine (LIDODERM) 5 % Place 1 patch onto the skin 2 (two) times daily.   losartan-hydrochlorothiazide (HYZAAR) 100-12.5 MG tablet Take 1 tablet by mouth daily.   Multiple Vitamins-Minerals (MULTIVITAMIN WITH MINERALS) tablet Take 1 tablet daily by mouth.   oxyCODONE (OXY IR/ROXICODONE) 5 MG immediate release tablet Take 1 tablet (5 mg total) by mouth every 6 (six) hours as needed for severe pain.   oxyCODONE-acetaminophen (PERCOCET) 10-325 MG tablet Take 1 tablet by mouth every 4 (four) hours as needed. (Patient taking differently: Take 1 tablet by mouth every 6 (six) hours as needed for pain.)   pregabalin (LYRICA) 75 MG capsule Take 75 mg by mouth 3 (three) times daily.   valACYclovir (VALTREX) 500 MG tablet Take 500 mg by mouth daily as needed.   VYVANSE 50 MG capsule Take 50 mg by mouth every morning.   [DISCONTINUED] amLODipine (NORVASC) 5 MG tablet Take 5 mg by mouth daily.   [DISCONTINUED] glycopyrrolate (ROBINUL) 1 MG tablet Take 1 mg by mouth.     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   Complication of anesthesia    Depression    History of pneumonia    Hypertension    Lumbar spondylosis    PCOS (polycystic ovarian syndrome)    Pneumonia    2016   PONV (postoperative nausea and vomiting)     PAST SURGICAL HISTORY: Past Surgical  History:  Procedure Laterality Date   ABDOMINAL EXPOSURE N/A 02/17/2018   Procedure: ABDOMINAL EXPOSURE;  Surgeon: Larina Earthly, MD;  Location: [facility] OR;  Service: Vascular;  Laterality: N/A;   ANKLE SURGERY     x 2   ANTERIOR LAT LUMBAR FUSION N/A 02/17/2018   Procedure: Lumbar three-four Lumbar four-five  Anterolateral lumbar interbody fusion;  Surgeon: Barnett Abu, MD;  Location: [facility] OR;  Service: Neurosurgery;  Laterality: N/A;   ANTERIOR LUMBAR FUSION N/A 02/17/2018   Procedure: Lumbar five-Sacral one Anterior lumbar interbody fusion;  Surgeon: Barnett Abu, MD;  Location: [facility] OR;  Service: Neurosurgery;  Laterality: N/A;   APPLICATION OF ROBOTIC ASSISTANCE FOR SPINAL PROCEDURE N/A 02/17/2018   Procedure: APPLICATION OF ROBOTIC ASSISTANCE FOR SPINAL PROCEDURE;  Surgeon: Barnett Abu, MD;  Location: [facility] OR;  Service: Neurosurgery;  Laterality: N/A;   APPLICATION OF ROBOTIC ASSISTANCE FOR SPINAL PROCEDURE N/A 06/09/2018   Procedure: APPLICATION OF ROBOTIC ASSISTANCE FOR SPINAL PROCEDURE;  Surgeon: Barnett Abu, MD;  Location: [facility] OR;  Service: Neurosurgery;  Laterality: N/A;   BACK SURGERY     2017  discectomy   CARPAL TUNNEL RELEASE Left 07/04/2014   Procedure: LEFT CARPAL TUNNEL RELEASE;  Surgeon: Loreta Ave, MD;  Location: [facility];  Service: Orthopedics;  Laterality: Left;   CARPAL TUNNEL RELEASE Right 08/08/2014   Procedure: RIGHT CARPAL TUNNEL RELEASE;  Surgeon: Loreta Ave, MD;  Location: [facility];  Service: Orthopedics;  Laterality: Right;   DILATION AND CURETTAGE OF UTERUS     DILATION AND EVACUATION  04/02/2011   Procedure: DILATATION AND EVACUATION (D&E);  Surgeon: Turner Daniels, MD;  Location: [facility];  Service: Gynecology;  Laterality: N/A;  dvt left mid thigh   DORSAL COMPARTMENT RELEASE Left 07/04/2014   Procedure: LEFT DEQUERVAINS;  Surgeon: Loreta Ave, MD;  Location: [facility];  Service: Orthopedics;  Laterality: Left;    ENDOSCOPIC PLANTAR FASCIOTOMY Left 02/01/2002   HARDWARE REMOVAL Right 06/09/2018   Procedure: Repositioning of Right Lumbar three Right lumbar  four pedicle screw with MAZOR;  Surgeon: Barnett Abu, MD;  Location[facility];  Service: Neurosurgery;  Laterality: Right;   HYSTEROSCOPY WITH D & C  07/17/2010   with exc. endometrial polyps   KNEE ARTHROSCOPY Right 03/07/2003; 01/14/2005; 08/31/2006   KNEE ARTHROSCOPY Left 03/21/2003; 11/26/2004   LUMBAR PERCUTANEOUS PEDICLE SCREW 3 LEVEL N/A 02/17/2018   Procedure: LUMBAR PERCUTANEOUS PEDICLE SCREW PLACEMENT LUMBAR THREE-SACRAL ONE;  Surgeon: Barnett Abu, MD;  Location: [facility] OR;  Service: Neurosurgery;  Laterality: N/A;   SHOULDER ARTHROSCOPY Right    TOE SURGERY Left 01/10/2003   claw toe correction 2nd, 3rd, 4th toes   TOTAL HIP ARTHROPLASTY Right 04/21/2016   TOTAL HIP ARTHROPLASTY Right 04/21/2016   Procedure: TOTAL HIP ARTHROPLASTY ANTERIOR APPROACH;  Surgeon: Loreta Ave, MD;  Location: [facility] OR;  Service: Orthopedics;  Laterality: Right;   TOTAL KNEE ARTHROPLASTY Left 01/21/2010   TOTAL KNEE ARTHROPLASTY Right 08/26/2008   UPPER GI ENDOSCOPY N/A 04/14/2021   Procedure: UPPER GI ENDOSCOPY;  Surgeon: Luretha Murphy, MD;  Location: [facility] ORS;  Service: General;  Laterality: N/A;    FAMILY HISTORY: The patient family history includes Breast cancer in her cousin and paternal grandmother; Cancer in her father, maternal grandmother, and paternal grandmother; Diabetes in her father and sister; Heart disease in her father, sister, and sister; Heart failure in her sister and sister; Kidney disease in her father.  SOCIAL HISTORY:  The patient  reports that she has never smoked. She has never used smokeless tobacco. She reports that she does not drink alcohol and does not use drugs.  REVIEW OF SYSTEMS: Review of Systems  Cardiovascular:  Positive for palpitations (intermittent, see above.). Negative for chest pain, claudication, dyspnea on exertion, irregular  heartbeat, leg swelling, near-syncope, orthopnea, paroxysmal nocturnal dyspnea and syncope.  Respiratory:  Positive for shortness of breath.   Hematologic/Lymphatic: Negative for bleeding problem.  Musculoskeletal:  Negative for muscle cramps and myalgias.  Neurological:  Positive for light-headedness (at time - not always). Negative for dizziness.    PHYSICAL EXAM:    03/17/2023    8:57 AM 10/28/2022    5:09 PM 04/19/2022   10:31 AM  Vitals with BMI  Height 5\' 11"  5\' 11"  5' 11.5"  Weight 243 lbs 241 lbs 242 lbs  BMI 33.91 33.63 33.29  Systolic 115    Diastolic 77    Pulse 80      Physical Exam  Constitutional: No distress.  Age appropriate, hemodynamically stable.   Neck: No JVD present.  Cardiovascular: Normal rate, regular rhythm, S1 normal, S2 normal, intact distal pulses and normal pulses. Exam reveals no gallop, no S3 and no S4.  No murmur heard. Pulmonary/Chest: Effort normal and breath sounds normal. No stridor. She has no wheezes. She has no rales.  Abdominal: Soft. Bowel sounds are normal. She exhibits no distension. There is no abdominal tenderness.  Musculoskeletal:        General: No edema.     Cervical back: Neck supple.  Neurological: She is alert and oriented to person, place, and time. She has intact cranial nerves (2-12).  Skin: Skin is warm and moist.    LABORATORY DATA: External Labs: Collected: 02/16/2023 Serum iron 39 (low) Transferring to 38. Iron sats 12 (low) TIBC 333 Total cholesterol 213, triglycerides 41, HDL 84, LDL calculated 122, non-HDL 129 BUN 13, creatinine 0.85. Sodium 142, potassium 4.4, chloride 106, bicarb 32. AST 14, ALT 12, alkaline phosphatase 83. Hemoglobin 11.9, hematocrit 37.3%. TSH 0.57  IMPRESSION:    ICD-10-CM   1. Shortness of breath  R06.02 PCV ECHOCARDIOGRAM COMPLETE    2. Benign hypertension  I10     3. Family history of premature CAD  Z82.49 CT CARDIAC SCORING (SELF PAY ONLY)    4. Tachycardia, unspecified   R00.0 EKG 12-Lead    5. Mixed hyperlipidemia  E78.2 CT CARDIAC SCORING (SELF PAY ONLY)    6. Hx of deep venous thrombosis  Z86.718        RECOMMENDATIONS: Ashley [name] is a 47 y.o. African-American female whose past medical history and cardiac risk factors include: Hypertension, PCOS, general anxiety disorder, family history of heart disease, hypercholesterolemia, obesity due to excess calories, status post gastric sleeve, history of DVT, aortic atherosclerosis.  Patient is referred to the practice for evaluation of blood pressure management given her recent episode of hypertensive urgency and family history of heart disease.  EMS rhythm strips reviewed.  Medications reconciled.  EKG illustrates sinus rhythm without underlying ischemia or injury pattern.  After that incident she followed up with PCP and was started on amlodipine and her blood pressures since then have improved nicely.  No additional medication changes warranted at this time.  She has a strong family history of heart disease involving both of her sisters.  1 sister has passed away the other 1 is still alive both of them have congestive heart failure.  However she does not know additional details with regards to the unifying diagnoses.  I have asked her to get some more information that may help evaluate her better.  Given her shortness of breath and hypertension the concern arises for hypertensive heart disease and therefore echocardiogram is warranted.  Given her family history of premature heart disease and hyperlipidemia we will proceed with coronary calcium score for further risk stratification.  Further recommendations to follow.  Data Reviewed: I have independently reviewed external notes provided by the referring provider as part of this office visit.   I have independently reviewed results of EKG, labs prior CT reports as part of medical decision making. I have ordered the following tests:  Orders Placed This  Encounter  Procedures   CT CARDIAC SCORING (SELF PAY ONLY)    Standing Status:   Future    Standing Expiration Date:   03/16/2024    Order Specific Question:   Preferred imaging location?    Answer:   External    Order Specific Question:  Is patient pregnant?    Answer:   No    Comments:   tubal ligation   EKG 12-Lead   PCV ECHOCARDIOGRAM COMPLETE    Standing Status:   Future    Standing Expiration Date:   03/16/2024   I have not made medications changes at today's encounter as noted above.   FINAL MEDICATION LIST END OF ENCOUNTER: No orders of the defined types were placed in this encounter.   Medications Discontinued During This Encounter  Medication Reason   buPROPion (WELLBUTRIN XL) 150 MG 24 hr tablet    pregabalin (LYRICA) 75 MG capsule    amLODipine (NORVASC) 2.5 MG tablet    glycopyrrolate (ROBINUL) 1 MG tablet    amLODipine (NORVASC) 5 MG tablet    enoxaparin (LOVENOX) 40 MG/0.4ML injection    glycopyrrolate (ROBINUL) 1 MG tablet    glycopyrrolate (ROBINUL) 1 MG tablet    levocetirizine (XYZAL) 5 MG tablet    lidocaine (LIDODERM) 5 %    ondansetron (ZOFRAN-ODT) 4 MG disintegrating tablet    pantoprazole (PROTONIX) 40 MG tablet    Pramoxine-HC (HYDROCORTISONE ACE-PRAMOXINE) 2.5-1 % CREA      Current Outpatient Medications:    Albuterol Sulfate (PROAIR RESPICLICK) 108 (90 Base) MCG/ACT AEPB, Inhale 1 puff into the lungs every 4 (four) hours as needed., Disp: 1 each, Rfl: 0   amLODipine (NORVASC) 5 MG tablet, , Disp: , Rfl:    baclofen (LIORESAL) 10 MG tablet, Take 3 tablets (30 mg total) by mouth 3 (three) times daily. (Patient taking differently: Take 10 mg by mouth 3 (three) times daily.), Disp: 270 each, Rfl: 1   baclofen (LIORESAL) 10 MG tablet, Take by mouth., Disp: , Rfl:    buPROPion (WELLBUTRIN XL) 300 MG 24 hr tablet, Take 300 mg by mouth daily., Disp: , Rfl:    cetirizine (ZYRTEC) 10 MG tablet, Take 10 mg by mouth daily as needed for allergies. , Disp: ,  Rfl:    docusate sodium (COLACE) 100 MG capsule, 1 capsule as needed for mild constipation, Disp: , Rfl:    EPINEPHrine 0.3 mg/0.3 mL IJ SOAJ injection, Inject 3 mLs into the muscle once., Disp: , Rfl: 0   fluticasone (FLONASE) 50 MCG/ACT nasal spray, Place 1 spray into both nostrils daily as needed for allergies., Disp: , Rfl:    furosemide (LASIX) 20 MG tablet, Take 20 mg by mouth daily as needed for edema., Disp: , Rfl:    guaiFENesin (MUCINEX) 600 MG 12 hr tablet, Take 1 tablet (600 mg total) by mouth 2 (two) times daily as needed for cough or to loosen phlegm., Disp: 20 tablet, Rfl: 0   hydrocortisone-pramoxine (ANALPRAM-HC) 2.5-1 % rectal cream, SMARTSIG:1 Application Rectally 3 Times Daily PRN, Disp: , Rfl:    levocetirizine (XYZAL ALLERGY 24HR) 5 MG tablet, 1 tablet in the evening, Disp: , Rfl:    lidocaine (LIDODERM) 5 %, Place 1 patch onto the skin 2 (two) times daily., Disp: , Rfl:    losartan-hydrochlorothiazide (HYZAAR) 100-12.5 MG tablet, Take 1 tablet by mouth daily., Disp: , Rfl:    Multiple Vitamins-Minerals (MULTIVITAMIN WITH MINERALS) tablet, Take 1 tablet daily by mouth., Disp: , Rfl:    oxyCODONE (OXY IR/ROXICODONE) 5 MG immediate release tablet, Take 1 tablet (5 mg total) by mouth every 6 (six) hours as needed for severe pain., Disp: 10 tablet, Rfl: 0   oxyCODONE-acetaminophen (PERCOCET) 10-325 MG tablet, Take 1 tablet by mouth every 4 (four) hours as needed. (Patient taking differently: Take 1  tablet by mouth every 6 (six) hours as needed for pain.), Disp: 60 tablet, Rfl: 0   pregabalin (LYRICA) 75 MG capsule, Take 75 mg by mouth 3 (three) times daily., Disp: , Rfl:    valACYclovir (VALTREX) 500 MG tablet, Take 500 mg by mouth daily as needed., Disp: , Rfl:    VYVANSE 50 MG capsule, Take 50 mg by mouth every morning., Disp: , Rfl:    methocarbamol (ROBAXIN) 500 MG tablet, methocarbamol 500 mg tablet (Patient not taking: Reported on 03/17/2023), Disp: , Rfl:   Orders Placed  This Encounter  Procedures   CT CARDIAC SCORING (SELF PAY ONLY)   EKG 12-Lead   PCV ECHOCARDIOGRAM COMPLETE    There are no Patient Instructions on file for this visit.   --Continue cardiac medications as reconciled in final medication list. --Return in about 8 weeks (around 05/12/2023) for Follow up, Review test results. or sooner if needed. --Continue follow-up with your primary care physician regarding the management of your other chronic comorbid conditions.  Patient's questions and concerns were addressed to her satisfaction. She voices understanding of the instructions provided during this encounter.   This note was created using a voice recognition software as a result there may be grammatical errors inadvertently enclosed that do not reflect the nature of this encounter. Every attempt is made to correct such errors.  Tessa Lerner, [place], [facility]  Pager:  [phone] Office: [phone]

## 2023-03-22 DIAGNOSIS — J301 Allergic rhinitis due to pollen: Secondary | ICD-10-CM | POA: Diagnosis not present

## 2023-03-22 DIAGNOSIS — J3089 Other allergic rhinitis: Secondary | ICD-10-CM | POA: Diagnosis not present

## 2023-03-22 DIAGNOSIS — J3081 Allergic rhinitis due to animal (cat) (dog) hair and dander: Secondary | ICD-10-CM | POA: Diagnosis not present

## 2023-03-23 DIAGNOSIS — G894 Chronic pain syndrome: Secondary | ICD-10-CM | POA: Diagnosis not present

## 2023-03-23 DIAGNOSIS — G4700 Insomnia, unspecified: Secondary | ICD-10-CM | POA: Diagnosis not present

## 2023-03-23 DIAGNOSIS — M47817 Spondylosis without myelopathy or radiculopathy, lumbosacral region: Secondary | ICD-10-CM | POA: Diagnosis not present

## 2023-03-29 DIAGNOSIS — J301 Allergic rhinitis due to pollen: Secondary | ICD-10-CM | POA: Diagnosis not present

## 2023-03-29 DIAGNOSIS — J3089 Other allergic rhinitis: Secondary | ICD-10-CM | POA: Diagnosis not present

## 2023-03-29 DIAGNOSIS — J3081 Allergic rhinitis due to animal (cat) (dog) hair and dander: Secondary | ICD-10-CM | POA: Diagnosis not present

## 2023-03-30 ENCOUNTER — Ambulatory Visit (HOSPITAL_COMMUNITY)
Admission: RE | Admit: 2023-03-30 | Discharge: 2023-03-30 | Disposition: A | Discharge status: Home / Self Care | Payer: 59 | Source: Ambulatory Visit | Attending: Cardiology | Admitting: Cardiology

## 2023-03-30 DIAGNOSIS — Z8249 Family history of ischemic heart disease and other diseases of the circulatory system: Secondary | ICD-10-CM

## 2023-03-30 DIAGNOSIS — E782 Mixed hyperlipidemia: Secondary | ICD-10-CM

## 2023-04-05 DIAGNOSIS — J3089 Other allergic rhinitis: Secondary | ICD-10-CM | POA: Diagnosis not present

## 2023-04-05 DIAGNOSIS — J3081 Allergic rhinitis due to animal (cat) (dog) hair and dander: Secondary | ICD-10-CM | POA: Diagnosis not present

## 2023-04-05 DIAGNOSIS — J301 Allergic rhinitis due to pollen: Secondary | ICD-10-CM | POA: Diagnosis not present

## 2023-04-08 DIAGNOSIS — Z903 Acquired absence of stomach [part of]: Secondary | ICD-10-CM | POA: Diagnosis not present

## 2023-04-08 DIAGNOSIS — E559 Vitamin D deficiency, unspecified: Secondary | ICD-10-CM | POA: Diagnosis not present

## 2023-04-12 ENCOUNTER — Ambulatory Visit: Payer: 59

## 2023-04-12 DIAGNOSIS — R0602 Shortness of breath: Secondary | ICD-10-CM

## 2023-04-12 DIAGNOSIS — J3081 Allergic rhinitis due to animal (cat) (dog) hair and dander: Secondary | ICD-10-CM | POA: Diagnosis not present

## 2023-04-12 DIAGNOSIS — J3089 Other allergic rhinitis: Secondary | ICD-10-CM | POA: Diagnosis not present

## 2023-04-12 DIAGNOSIS — J301 Allergic rhinitis due to pollen: Secondary | ICD-10-CM | POA: Diagnosis not present

## 2023-04-19 DIAGNOSIS — R0683 Snoring: Secondary | ICD-10-CM | POA: Diagnosis not present

## 2023-04-20 DIAGNOSIS — J3081 Allergic rhinitis due to animal (cat) (dog) hair and dander: Secondary | ICD-10-CM | POA: Diagnosis not present

## 2023-04-20 DIAGNOSIS — J3089 Other allergic rhinitis: Secondary | ICD-10-CM | POA: Diagnosis not present

## 2023-04-20 DIAGNOSIS — J301 Allergic rhinitis due to pollen: Secondary | ICD-10-CM | POA: Diagnosis not present

## 2023-05-05 DIAGNOSIS — J301 Allergic rhinitis due to pollen: Secondary | ICD-10-CM | POA: Diagnosis not present

## 2023-05-05 DIAGNOSIS — J3089 Other allergic rhinitis: Secondary | ICD-10-CM | POA: Diagnosis not present

## 2023-05-05 DIAGNOSIS — J3081 Allergic rhinitis due to animal (cat) (dog) hair and dander: Secondary | ICD-10-CM | POA: Diagnosis not present

## 2023-05-11 DIAGNOSIS — J301 Allergic rhinitis due to pollen: Secondary | ICD-10-CM | POA: Diagnosis not present

## 2023-05-11 DIAGNOSIS — J3081 Allergic rhinitis due to animal (cat) (dog) hair and dander: Secondary | ICD-10-CM | POA: Diagnosis not present

## 2023-05-11 DIAGNOSIS — J3089 Other allergic rhinitis: Secondary | ICD-10-CM | POA: Diagnosis not present

## 2023-05-16 ENCOUNTER — Ambulatory Visit: Payer: 59 | Admitting: Cardiology

## 2023-05-16 ENCOUNTER — Encounter: Payer: Self-pay | Admitting: Cardiology

## 2023-05-16 VITALS — BP 142/77 | HR 80 | Resp 17 | Ht 71.0 in | Wt 247.0 lb

## 2023-05-16 DIAGNOSIS — Z8249 Family history of ischemic heart disease and other diseases of the circulatory system: Secondary | ICD-10-CM | POA: Diagnosis not present

## 2023-05-16 DIAGNOSIS — I10 Essential (primary) hypertension: Secondary | ICD-10-CM | POA: Diagnosis not present

## 2023-05-16 DIAGNOSIS — R931 Abnormal findings on diagnostic imaging of heart and coronary circulation: Secondary | ICD-10-CM | POA: Diagnosis not present

## 2023-05-16 DIAGNOSIS — E782 Mixed hyperlipidemia: Secondary | ICD-10-CM

## 2023-05-16 DIAGNOSIS — G8929 Other chronic pain: Secondary | ICD-10-CM | POA: Diagnosis not present

## 2023-05-16 MED ORDER — ROSUVASTATIN CALCIUM 10 MG PO TABS: 10.0000 mg | ORAL_TABLET | Freq: Every day | ORAL | 3 refills | Status: DC

## 2023-05-16 MED ORDER — ASPIRIN 81 MG PO TBEC: 81.0000 mg | DELAYED_RELEASE_TABLET | Freq: Every day | ORAL | 12 refills | Status: DC

## 2023-05-16 NOTE — Progress Notes (Signed)
ID:  Ashley [name], DOB 1975-10-30, MRN [id number]  PCP:  Ceasar Lund, PA  Cardiologist:  Tessa Lerner, DO, [facility] (established care 05/16/23)  Date: 05/16/23 Last Office Visit: 03/17/2023  Chief Complaint  Patient presents with   Shortness of Breath   Follow-up    8 week    HPI  Ashley [name] is a 47 y.o. African-American female whose past medical history and cardiovascular risk factors include: Mild CAC, hypertension, PCOS, general anxiety disorder, family history of heart disease, hypercholesterolemia, obesity due to excess calories, status post gastric sleeve, history of DVT, aortic atherosclerosis.   Patient was referred to the practice for evaluation of hypertensive urgency, tachycardia, family history of heart disease.  In June 2024 she was experiencing palpitations and noted elevated heart rate around 134 bpm associated with shortness of breath.  EMS was called and she was noted to have a blood pressure of 210/100 she was advised to go to the ED at that time but chose not to.  She was started on antihypertensive medications by PCP and was referred to cardiology for further evaluation given her strong family history of heart disease.  At the last office visit the shared decision was to proceed with an echocardiogram, and coronary calcium score for further risk stratification.  Echocardiogram notes preserved LVEF without any significant valvular heart disease.  However, coronary calcium score notes mild CAC but given her young age placing her at 98th percentile.  Since last office visit patient states that the heart rate is better controlled.  Shortness of breath essentially resolved.  Home blood pressures according to her is between 120-130 mmHg.  FUNCTIONAL STATUS: No structured exercise program or daily routine. Limited from prior orthopedic surgeries.    CARDIAC DATABASE: EKG: March 17, 2023: Sinus rhythm, 80 bpm, without underlying ischemia or injury  pattern.  Echocardiogram: 04/12/2023: Normal LV systolic function with visual EF 60-65%. Left ventricle cavity is normal in size. Normal global wall motion. Normal diastolic filling pattern, normal LAP. Mild left ventricular hypertrophy.  Pericardium is normal. Trace pericardial effusion. There is no hemodynamic significance. No significant valvular heart disease.  No prior study for comparison.  Stress Testing: No results found for this or any previous visit from the past 1095 days.  Heart Catheterization: None  CT Cardiac Scoring: March 30, 2023 Left main: 0 Left anterior descending artery: 78.2 Left circumflex artery: 0 Right coronary artery: 1.98  Total: 80.2 Percentile: 98th  Pericardium: Normal.  Ascending Aorta: Normal caliber.  Noncardiac findings:No acute or significant extracardiac abnormality.  Total CAC 80.2 AU, 98th percentile for patient's age, sex, and race.    RADIOLOGY: CT LUMBAR SPINE WO CONTRAST July 2019 Aortic atherosclerosis See report for additional details.   ALLERGIES: Allergies  Allergen Reactions   Gadolinium Derivatives Nausea And Vomiting and Other (See Comments)    Pt describes this happens every time she gets gado even with slow injection  Other Reaction(s): Other (See Comments)   Codeine Itching, Nausea Only and Other (See Comments)    Hydrocodone does not affect pt   Duloxetine     Other reaction(s): insomia  Other Reaction(s): Other (See Comments)  Other reaction(s): insomia  Altered mental status   Duloxetine Hcl Other (See Comments)    Altered mental status  Other Reaction(s): depression, other    MEDICATION LIST PRIOR TO VISIT: Current Meds  Medication Sig   Albuterol Sulfate (PROAIR RESPICLICK) 108 (90 Base) MCG/ACT AEPB Inhale 1 puff into the lungs every 4 (  four) hours as needed.   amLODipine (NORVASC) 5 MG tablet    aspirin EC 81 MG tablet Take 1 tablet (81 mg total) by mouth daily. Swallow whole.   baclofen  (LIORESAL) 10 MG tablet Take 3 tablets (30 mg total) by mouth 3 (three) times daily. (Patient taking differently: Take 10 mg by mouth 3 (three) times daily.)   baclofen (LIORESAL) 10 MG tablet Take by mouth.   buPROPion (WELLBUTRIN XL) 300 MG 24 hr tablet Take 300 mg by mouth daily.   cetirizine (ZYRTEC) 10 MG tablet Take 10 mg by mouth daily as needed for allergies.    docusate sodium (COLACE) 100 MG capsule 1 capsule as needed for mild constipation   EPINEPHrine 0.3 mg/0.3 mL IJ SOAJ injection Inject 3 mLs into the muscle once.   fluticasone (FLONASE) 50 MCG/ACT nasal spray Place 1 spray into both nostrils daily as needed for allergies.   furosemide (LASIX) 20 MG tablet Take 20 mg by mouth daily as needed for edema.   hydrocortisone-pramoxine (ANALPRAM-HC) 2.5-1 % rectal cream SMARTSIG:1 Application Rectally 3 Times Daily PRN   levocetirizine (XYZAL ALLERGY 24HR) 5 MG tablet 1 tablet in the evening   lidocaine (LIDODERM) 5 % Place 1 patch onto the skin 2 (two) times daily.   losartan-hydrochlorothiazide (HYZAAR) 100-12.5 MG tablet Take 1 tablet by mouth daily.   Multiple Vitamins-Minerals (MULTIVITAMIN WITH MINERALS) tablet Take 1 tablet daily by mouth.   oxyCODONE-acetaminophen (PERCOCET) 10-325 MG tablet Take 1 tablet by mouth every 4 (four) hours as needed. (Patient taking differently: Take 1 tablet by mouth every 6 (six) hours as needed for pain.)   pregabalin (LYRICA) 75 MG capsule Take 75 mg by mouth 3 (three) times daily.   rosuvastatin (CRESTOR) 10 MG tablet Take 1 tablet (10 mg total) by mouth daily.   valACYclovir (VALTREX) 500 MG tablet Take 500 mg by mouth daily as needed.   VYVANSE 50 MG capsule Take 50 mg by mouth every morning.     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   Complication of anesthesia    Depression    History of pneumonia    Hypertension    Lumbar spondylosis     PCOS (polycystic ovarian syndrome)    Pneumonia    2016   PONV (postoperative nausea and vomiting)     PAST SURGICAL HISTORY: Past Surgical History:  Procedure Laterality Date   ABDOMINAL EXPOSURE N/A 02/17/2018   Procedure: ABDOMINAL EXPOSURE;  Surgeon: Larina Earthly, MD;  Location: [facility] OR;  Service: Vascular;  Laterality: N/A;   ANKLE SURGERY     x 2   ANTERIOR LAT LUMBAR FUSION N/A 02/17/2018   Procedure: Lumbar three-four Lumbar four-five  Anterolateral lumbar interbody fusion;  Surgeon: Barnett Abu, MD;  Location: [facility] OR;  Service: Neurosurgery;  Laterality: N/A;   ANTERIOR LUMBAR FUSION N/A 02/17/2018   Procedure: Lumbar five-Sacral one Anterior lumbar interbody fusion;  Surgeon: Barnett Abu, MD;  Location: [facility] OR;  Service: Neurosurgery;  Laterality: N/A;   APPLICATION OF ROBOTIC ASSISTANCE FOR SPINAL PROCEDURE N/A 02/17/2018   Procedure: APPLICATION OF ROBOTIC ASSISTANCE FOR SPINAL PROCEDURE;  Surgeon: Barnett Abu, MD;  Location: [facility] OR;  Service: Neurosurgery;  Laterality: N/A;   APPLICATION OF ROBOTIC ASSISTANCE FOR SPINAL PROCEDURE N/A 06/09/2018   Procedure: APPLICATION OF ROBOTIC ASSISTANCE FOR SPINAL PROCEDURE;  Surgeon: Barnett Abu, MD;  Location: [facility] OR;  Service: Neurosurgery;  Laterality: N/A;   BACK SURGERY     2017  discectomy   CARPAL TUNNEL RELEASE Left 07/04/2014   Procedure: LEFT CARPAL TUNNEL RELEASE;  Surgeon: Loreta Ave, MD;  Location: [facility];  Service: Orthopedics;  Laterality: Left;   CARPAL TUNNEL RELEASE Right 08/08/2014   Procedure: RIGHT CARPAL TUNNEL RELEASE;  Surgeon: Loreta Ave, MD;  Location: [facility];  Service: Orthopedics;  Laterality: Right;   DILATION AND CURETTAGE OF UTERUS     DILATION AND EVACUATION  04/02/2011   Procedure: DILATATION AND EVACUATION (D&E);  Surgeon: Turner Daniels, MD;  Location: [facility]S;  Service: Gynecology;  Laterality: N/A;  dvt left mid thigh   DORSAL COMPARTMENT RELEASE Left  07/04/2014   Procedure: LEFT DEQUERVAINS;  Surgeon: Loreta Ave, MD;  Location:  SURGERY CENTER;  Service: Orthopedics;  Laterality: Left;   ENDOSCOPIC PLANTAR FASCIOTOMY Left 02/01/2002   HARDWARE REMOVAL Right 06/09/2018   Procedure: Repositioning of Right Lumbar three Right lumbar  four pedicle screw with MAZOR;  Surgeon: Barnett Abu, MD;  Location: [facility] OR;  Service: Neurosurgery;  Laterality: Right;   HYSTEROSCOPY WITH D & C  07/17/2010   with exc. endometrial polyps   KNEE ARTHROSCOPY Right 03/07/2003; 01/14/2005; 08/31/2006   KNEE ARTHROSCOPY Left 03/21/2003; 11/26/2004   LUMBAR PERCUTANEOUS PEDICLE SCREW 3 LEVEL N/A 02/17/2018   Procedure: LUMBAR PERCUTANEOUS PEDICLE SCREW PLACEMENT LUMBAR THREE-SACRAL ONE;  Surgeon: Barnett Abu, MD;  Location: [facility] OR;  Service: Neurosurgery;  Laterality: N/A;   SHOULDER ARTHROSCOPY Right    TOE SURGERY Left 01/10/2003   claw toe correction 2nd, 3rd, 4th toes   TOTAL HIP ARTHROPLASTY Right 04/21/2016   TOTAL HIP ARTHROPLASTY Right 04/21/2016   Procedure: TOTAL HIP ARTHROPLASTY ANTERIOR APPROACH;  Surgeon: Loreta Ave, MD;  Location: [facility] OR;  Service: Orthopedics;  Laterality: Right;   TOTAL KNEE ARTHROPLASTY Left 01/21/2010   TOTAL KNEE ARTHROPLASTY Right 08/26/2008   UPPER GI ENDOSCOPY N/A 04/14/2021   Procedure: UPPER GI ENDOSCOPY;  Surgeon: Luretha Murphy, MD;  Location: [facility] ORS;  Service: General;  Laterality: N/A;    FAMILY HISTORY: The patient family history includes Breast cancer in her cousin and paternal grandmother; Cancer in her father, maternal grandmother, and paternal grandmother; Diabetes in her father and sister; Heart disease in her father, sister, and sister; Heart failure in her sister and sister; Kidney disease in her father.  SOCIAL HISTORY:  The patient  reports that she has never smoked. She has never used smokeless tobacco. She reports that she does not drink alcohol and does not use drugs.  REVIEW OF SYSTEMS: Review of  Systems  Cardiovascular:  Negative for chest pain, claudication, dyspnea on exertion, irregular heartbeat, leg swelling, near-syncope, orthopnea, palpitations, paroxysmal nocturnal dyspnea and syncope.  Respiratory:  Negative for shortness of breath.   Hematologic/Lymphatic: Negative for bleeding problem.  Musculoskeletal:  Negative for muscle cramps and myalgias.  Neurological:  Negative for dizziness and light-headedness.    PHYSICAL EXAM:    05/16/2023    3:05 PM 03/17/2023    8:57 AM 10/28/2022    5:09 PM  Vitals with BMI  Height 5\' 11"  5\' 11"  5\' 11"   Weight 247 lbs 243 lbs 241 lbs  BMI 34.46 33.91 33.63  Systolic 142 115   Diastolic 77 77   Pulse 80 80     Physical Exam  Constitutional: No distress.  Age appropriate, hemodynamically stable.   Neck: No JVD present.  Cardiovascular: Normal rate, regular rhythm, S1 normal, S2 normal, intact distal pulses and normal pulses. Exam reveals no gallop, no S3 and no S4.  No murmur heard. Pulmonary/Chest: Effort normal and breath sounds normal. No stridor. She has no wheezes. She has no rales.  Abdominal: Soft. Bowel sounds are normal. She exhibits no distension. There is no abdominal tenderness.  Musculoskeletal:        General: No edema.     Cervical back: Neck supple.  Neurological: She is alert and oriented to person, place, and time. She has intact cranial nerves (2-12).  Skin: Skin is warm and moist.    LABORATORY DATA: External Labs: Collected: 02/16/2023 Serum iron 39 (low) Transferring to 38. Iron sats 12 (low) TIBC 333 Total cholesterol 213, triglycerides 41, HDL 84, LDL calculated 122, non-HDL 129 BUN 13, creatinine 0.85. Sodium 142, potassium 4.4, chloride 106, bicarb 32. AST 14, ALT 12, alkaline phosphatase 83. Hemoglobin 11.9, hematocrit 37.3%. TSH 0.57  IMPRESSION:    ICD-10-CM   1. Agatston coronary artery calcium score less than 100  R93.1 aspirin EC 81 MG tablet    rosuvastatin (CRESTOR) 10 MG tablet     PCV CARDIAC STRESS TEST    Lipid Panel With LDL/HDL Ratio    LDL cholesterol, direct    CMP14+EGFR    2. Family history of premature CAD  Z82.49     3. Mixed hyperlipidemia  E78.2 rosuvastatin (CRESTOR) 10 MG tablet    Lipid Panel With LDL/HDL Ratio    LDL cholesterol, direct    CMP14+EGFR    4. Benign hypertension  I10         RECOMMENDATIONS: Ashley [name] is a 47 y.o. African-American female whose past medical history and cardiac risk factors include: Mild CAC, hypertension, PCOS, general anxiety disorder, family history of heart disease, hypercholesterolemia, obesity due to excess calories, status post gastric sleeve, history of DVT, aortic atherosclerosis.  Agatston coronary artery calcium score less than 100 Family history of premature CAD Total CAC 80.2, 90th percentile. Strong family history of CAD. Shared decision was to start aspirin 81 mg p.o. daily as well as statin therapy. Start rosuvastatin 10 mg p.o. nightly. Repeat labs in 6 weeks to reevaluate therapy. Echo results reviewed with the patient. GXT to evaluate functional capacity, and exercise-induced arrhythmia/ischemia.  Mixed hyperlipidemia For reasons mentioned above we will start statin therapy. She denies myalgia or other side effects. Most recent lipids dated May 2024, independently reviewed as noted above.  Benign hypertension Office blood pressures are acceptable. Home blood pressures are better controlled, per patient SBP ranges between 120-130 mmHg. Medications reconciled We emphasized importance of a low-salt diet/DASH diet  FINAL MEDICATION LIST END OF ENCOUNTER: Meds ordered this encounter  Medications   aspirin EC 81 MG tablet    Sig: Take 1 tablet (81 mg total) by mouth daily. Swallow whole.    Dispense:  30 tablet    Refill:  12   rosuvastatin (CRESTOR) 10 MG tablet    Sig: Take 1 tablet (10 mg total) by mouth daily.    Dispense:  90 tablet    Refill:  3    Medications  Discontinued During This Encounter  Medication Reason   guaiFENesin (MUCINEX) 600 MG 12 hr tablet    methocarbamol (ROBAXIN) 500 MG tablet    oxyCODONE (OXY IR/ROXICODONE) 5 MG immediate release tablet      Current Outpatient Medications:    Albuterol Sulfate (PROAIR RESPICLICK) 108 (90 Base) MCG/ACT AEPB, Inhale 1 puff  into the lungs every 4 (four) hours as needed., Disp: 1 each, Rfl: 0   amLODipine (NORVASC) 5 MG tablet, , Disp: , Rfl:    aspirin EC 81 MG tablet, Take 1 tablet (81 mg total) by mouth daily. Swallow whole., Disp: 30 tablet, Rfl: 12   baclofen (LIORESAL) 10 MG tablet, Take 3 tablets (30 mg total) by mouth 3 (three) times daily. (Patient taking differently: Take 10 mg by mouth 3 (three) times daily.), Disp: 270 each, Rfl: 1   baclofen (LIORESAL) 10 MG tablet, Take by mouth., Disp: , Rfl:    buPROPion (WELLBUTRIN XL) 300 MG 24 hr tablet, Take 300 mg by mouth daily., Disp: , Rfl:    cetirizine (ZYRTEC) 10 MG tablet, Take 10 mg by mouth daily as needed for allergies. , Disp: , Rfl:    docusate sodium (COLACE) 100 MG capsule, 1 capsule as needed for mild constipation, Disp: , Rfl:    EPINEPHrine 0.3 mg/0.3 mL IJ SOAJ injection, Inject 3 mLs into the muscle once., Disp: , Rfl: 0   fluticasone (FLONASE) 50 MCG/ACT nasal spray, Place 1 spray into both nostrils daily as needed for allergies., Disp: , Rfl:    furosemide (LASIX) 20 MG tablet, Take 20 mg by mouth daily as needed for edema., Disp: , Rfl:    hydrocortisone-pramoxine (ANALPRAM-HC) 2.5-1 % rectal cream, SMARTSIG:1 Application Rectally 3 Times Daily PRN, Disp: , Rfl:    levocetirizine (XYZAL ALLERGY 24HR) 5 MG tablet, 1 tablet in the evening, Disp: , Rfl:    lidocaine (LIDODERM) 5 %, Place 1 patch onto the skin 2 (two) times daily., Disp: , Rfl:    losartan-hydrochlorothiazide (HYZAAR) 100-12.5 MG tablet, Take 1 tablet by mouth daily., Disp: , Rfl:    Multiple Vitamins-Minerals (MULTIVITAMIN WITH MINERALS) tablet, Take 1 tablet  daily by mouth., Disp: , Rfl:    oxyCODONE-acetaminophen (PERCOCET) 10-325 MG tablet, Take 1 tablet by mouth every 4 (four) hours as needed. (Patient taking differently: Take 1 tablet by mouth every 6 (six) hours as needed for pain.), Disp: 60 tablet, Rfl: 0   pregabalin (LYRICA) 75 MG capsule, Take 75 mg by mouth 3 (three) times daily., Disp: , Rfl:    rosuvastatin (CRESTOR) 10 MG tablet, Take 1 tablet (10 mg total) by mouth daily., Disp: 90 tablet, Rfl: 3   valACYclovir (VALTREX) 500 MG tablet, Take 500 mg by mouth daily as needed., Disp: , Rfl:    VYVANSE 50 MG capsule, Take 50 mg by mouth every morning., Disp: , Rfl:   Orders Placed This Encounter  Procedures   Lipid Panel With LDL/HDL Ratio   LDL cholesterol, direct   [id number]   PCV CARDIAC STRESS TEST    There are no Patient Instructions on file for this visit.   --Continue cardiac medications as reconciled in final medication list. --Return in about 8 weeks (around 07/11/2023) for Follow up, Coronary artery calcification, Review test results. or sooner if needed. --Continue follow-up with your primary care physician regarding the management of your other chronic comorbid conditions.  Patient's questions and concerns were addressed to her satisfaction. She voices understanding of the instructions provided during this encounter.   This note was created using a voice recognition software as a result there may be grammatical errors inadvertently enclosed that do not reflect the nature of this encounter. Every attempt is made to correct such errors.  Tessa Lerner, [place], [facility]  Pager:  [phone] Office: [phone]

## 2023-05-17 DIAGNOSIS — G4700 Insomnia, unspecified: Secondary | ICD-10-CM | POA: Diagnosis not present

## 2023-05-17 DIAGNOSIS — M47817 Spondylosis without myelopathy or radiculopathy, lumbosacral region: Secondary | ICD-10-CM | POA: Diagnosis not present

## 2023-05-17 DIAGNOSIS — Z79891 Long term (current) use of opiate analgesic: Secondary | ICD-10-CM | POA: Diagnosis not present

## 2023-05-17 DIAGNOSIS — G894 Chronic pain syndrome: Secondary | ICD-10-CM | POA: Diagnosis not present

## 2023-05-18 DIAGNOSIS — J3081 Allergic rhinitis due to animal (cat) (dog) hair and dander: Secondary | ICD-10-CM | POA: Diagnosis not present

## 2023-05-18 DIAGNOSIS — J3089 Other allergic rhinitis: Secondary | ICD-10-CM | POA: Diagnosis not present

## 2023-05-18 DIAGNOSIS — J301 Allergic rhinitis due to pollen: Secondary | ICD-10-CM | POA: Diagnosis not present

## 2023-05-20 ENCOUNTER — Ambulatory Visit: Payer: 59

## 2023-05-20 DIAGNOSIS — R931 Abnormal findings on diagnostic imaging of heart and coronary circulation: Secondary | ICD-10-CM | POA: Diagnosis not present

## 2023-05-25 DIAGNOSIS — J301 Allergic rhinitis due to pollen: Secondary | ICD-10-CM | POA: Diagnosis not present

## 2023-05-25 DIAGNOSIS — J3081 Allergic rhinitis due to animal (cat) (dog) hair and dander: Secondary | ICD-10-CM | POA: Diagnosis not present

## 2023-05-25 DIAGNOSIS — J3089 Other allergic rhinitis: Secondary | ICD-10-CM | POA: Diagnosis not present

## 2023-06-01 DIAGNOSIS — J301 Allergic rhinitis due to pollen: Secondary | ICD-10-CM | POA: Diagnosis not present

## 2023-06-01 DIAGNOSIS — J3089 Other allergic rhinitis: Secondary | ICD-10-CM | POA: Diagnosis not present

## 2023-06-01 DIAGNOSIS — J3081 Allergic rhinitis due to animal (cat) (dog) hair and dander: Secondary | ICD-10-CM | POA: Diagnosis not present

## 2023-06-01 NOTE — Progress Notes (Signed)
Patient understands.

## 2023-06-08 DIAGNOSIS — J301 Allergic rhinitis due to pollen: Secondary | ICD-10-CM | POA: Diagnosis not present

## 2023-06-08 DIAGNOSIS — J3089 Other allergic rhinitis: Secondary | ICD-10-CM | POA: Diagnosis not present

## 2023-06-08 DIAGNOSIS — J3081 Allergic rhinitis due to animal (cat) (dog) hair and dander: Secondary | ICD-10-CM | POA: Diagnosis not present

## 2023-06-16 DIAGNOSIS — J301 Allergic rhinitis due to pollen: Secondary | ICD-10-CM | POA: Diagnosis not present

## 2023-06-16 DIAGNOSIS — J3089 Other allergic rhinitis: Secondary | ICD-10-CM | POA: Diagnosis not present

## 2023-06-16 DIAGNOSIS — J3081 Allergic rhinitis due to animal (cat) (dog) hair and dander: Secondary | ICD-10-CM | POA: Diagnosis not present

## 2023-06-21 DIAGNOSIS — J3081 Allergic rhinitis due to animal (cat) (dog) hair and dander: Secondary | ICD-10-CM | POA: Diagnosis not present

## 2023-06-21 DIAGNOSIS — J3089 Other allergic rhinitis: Secondary | ICD-10-CM | POA: Diagnosis not present

## 2023-06-21 DIAGNOSIS — J301 Allergic rhinitis due to pollen: Secondary | ICD-10-CM | POA: Diagnosis not present

## 2023-06-28 DIAGNOSIS — J3081 Allergic rhinitis due to animal (cat) (dog) hair and dander: Secondary | ICD-10-CM | POA: Diagnosis not present

## 2023-06-28 DIAGNOSIS — J301 Allergic rhinitis due to pollen: Secondary | ICD-10-CM | POA: Diagnosis not present

## 2023-06-28 DIAGNOSIS — J3089 Other allergic rhinitis: Secondary | ICD-10-CM | POA: Diagnosis not present

## 2023-07-05 DIAGNOSIS — J3089 Other allergic rhinitis: Secondary | ICD-10-CM | POA: Diagnosis not present

## 2023-07-05 DIAGNOSIS — J301 Allergic rhinitis due to pollen: Secondary | ICD-10-CM | POA: Diagnosis not present

## 2023-07-05 DIAGNOSIS — J3081 Allergic rhinitis due to animal (cat) (dog) hair and dander: Secondary | ICD-10-CM | POA: Diagnosis not present

## 2023-07-12 ENCOUNTER — Ambulatory Visit: Payer: Self-pay | Admitting: Cardiology

## 2023-07-12 DIAGNOSIS — J3089 Other allergic rhinitis: Secondary | ICD-10-CM | POA: Diagnosis not present

## 2023-07-12 DIAGNOSIS — J301 Allergic rhinitis due to pollen: Secondary | ICD-10-CM | POA: Diagnosis not present

## 2023-07-12 DIAGNOSIS — J3081 Allergic rhinitis due to animal (cat) (dog) hair and dander: Secondary | ICD-10-CM | POA: Diagnosis not present

## 2023-07-14 DIAGNOSIS — G4700 Insomnia, unspecified: Secondary | ICD-10-CM | POA: Diagnosis not present

## 2023-07-14 DIAGNOSIS — M47817 Spondylosis without myelopathy or radiculopathy, lumbosacral region: Secondary | ICD-10-CM | POA: Diagnosis not present

## 2023-07-14 DIAGNOSIS — G894 Chronic pain syndrome: Secondary | ICD-10-CM | POA: Diagnosis not present

## 2023-07-18 DIAGNOSIS — J3089 Other allergic rhinitis: Secondary | ICD-10-CM | POA: Diagnosis not present

## 2023-07-18 DIAGNOSIS — J301 Allergic rhinitis due to pollen: Secondary | ICD-10-CM | POA: Diagnosis not present

## 2023-07-18 DIAGNOSIS — J3081 Allergic rhinitis due to animal (cat) (dog) hair and dander: Secondary | ICD-10-CM | POA: Diagnosis not present

## 2023-07-21 ENCOUNTER — Other Ambulatory Visit (HOSPITAL_COMMUNITY): Payer: Self-pay

## 2023-07-21 MED ORDER — OXYCODONE-ACETAMINOPHEN 10-325 MG PO TABS
1.0000 | ORAL_TABLET | Freq: Four times a day (QID) | ORAL | 0 refills | Status: DC
  Filled 2023-07-21 – 2023-07-22 (×2): qty 120, 30d supply, fill #0

## 2023-07-22 ENCOUNTER — Other Ambulatory Visit (HOSPITAL_COMMUNITY): Payer: Self-pay

## 2023-07-27 ENCOUNTER — Ambulatory Visit: Payer: 59 | Admitting: Cardiology

## 2023-08-01 DIAGNOSIS — J301 Allergic rhinitis due to pollen: Secondary | ICD-10-CM | POA: Diagnosis not present

## 2023-08-01 DIAGNOSIS — J3089 Other allergic rhinitis: Secondary | ICD-10-CM | POA: Diagnosis not present

## 2023-08-01 DIAGNOSIS — J3081 Allergic rhinitis due to animal (cat) (dog) hair and dander: Secondary | ICD-10-CM | POA: Diagnosis not present

## 2023-08-08 DIAGNOSIS — J3089 Other allergic rhinitis: Secondary | ICD-10-CM | POA: Diagnosis not present

## 2023-08-08 DIAGNOSIS — J301 Allergic rhinitis due to pollen: Secondary | ICD-10-CM | POA: Diagnosis not present

## 2023-08-08 DIAGNOSIS — J3081 Allergic rhinitis due to animal (cat) (dog) hair and dander: Secondary | ICD-10-CM | POA: Diagnosis not present

## 2023-08-17 ENCOUNTER — Other Ambulatory Visit (HOSPITAL_COMMUNITY): Payer: Self-pay

## 2023-08-17 MED ORDER — OXYCODONE-ACETAMINOPHEN 10-325 MG PO TABS
1.0000 | ORAL_TABLET | Freq: Four times a day (QID) | ORAL | 0 refills | Status: DC
  Filled 2023-08-17 – 2023-08-19 (×2): qty 120, 30d supply, fill #0

## 2023-08-19 ENCOUNTER — Other Ambulatory Visit (HOSPITAL_COMMUNITY): Payer: Self-pay

## 2023-08-22 DIAGNOSIS — J3089 Other allergic rhinitis: Secondary | ICD-10-CM | POA: Diagnosis not present

## 2023-08-22 DIAGNOSIS — J3081 Allergic rhinitis due to animal (cat) (dog) hair and dander: Secondary | ICD-10-CM | POA: Diagnosis not present

## 2023-08-22 DIAGNOSIS — J301 Allergic rhinitis due to pollen: Secondary | ICD-10-CM | POA: Diagnosis not present

## 2023-08-23 DIAGNOSIS — G8929 Other chronic pain: Secondary | ICD-10-CM | POA: Diagnosis not present

## 2023-08-23 DIAGNOSIS — R61 Generalized hyperhidrosis: Secondary | ICD-10-CM | POA: Diagnosis not present

## 2023-08-23 DIAGNOSIS — D509 Iron deficiency anemia, unspecified: Secondary | ICD-10-CM | POA: Diagnosis not present

## 2023-08-23 DIAGNOSIS — R600 Localized edema: Secondary | ICD-10-CM | POA: Diagnosis not present

## 2023-08-23 DIAGNOSIS — Z23 Encounter for immunization: Secondary | ICD-10-CM | POA: Diagnosis not present

## 2023-08-23 DIAGNOSIS — G479 Sleep disorder, unspecified: Secondary | ICD-10-CM | POA: Diagnosis not present

## 2023-08-23 DIAGNOSIS — I10 Essential (primary) hypertension: Secondary | ICD-10-CM | POA: Diagnosis not present

## 2023-08-23 DIAGNOSIS — E7800 Pure hypercholesterolemia, unspecified: Secondary | ICD-10-CM | POA: Diagnosis not present

## 2023-08-23 DIAGNOSIS — R8290 Unspecified abnormal findings in urine: Secondary | ICD-10-CM | POA: Diagnosis not present

## 2023-08-30 DIAGNOSIS — J3089 Other allergic rhinitis: Secondary | ICD-10-CM | POA: Diagnosis not present

## 2023-08-30 DIAGNOSIS — J301 Allergic rhinitis due to pollen: Secondary | ICD-10-CM | POA: Diagnosis not present

## 2023-08-30 DIAGNOSIS — J3081 Allergic rhinitis due to animal (cat) (dog) hair and dander: Secondary | ICD-10-CM | POA: Diagnosis not present

## 2023-09-05 DIAGNOSIS — J301 Allergic rhinitis due to pollen: Secondary | ICD-10-CM | POA: Diagnosis not present

## 2023-09-05 DIAGNOSIS — J3081 Allergic rhinitis due to animal (cat) (dog) hair and dander: Secondary | ICD-10-CM | POA: Diagnosis not present

## 2023-09-05 DIAGNOSIS — J3089 Other allergic rhinitis: Secondary | ICD-10-CM | POA: Diagnosis not present

## 2023-09-08 ENCOUNTER — Other Ambulatory Visit (HOSPITAL_COMMUNITY): Payer: Self-pay

## 2023-09-08 ENCOUNTER — Other Ambulatory Visit: Payer: Self-pay

## 2023-09-08 DIAGNOSIS — G894 Chronic pain syndrome: Secondary | ICD-10-CM | POA: Diagnosis not present

## 2023-09-08 DIAGNOSIS — M47817 Spondylosis without myelopathy or radiculopathy, lumbosacral region: Secondary | ICD-10-CM | POA: Diagnosis not present

## 2023-09-08 DIAGNOSIS — G4700 Insomnia, unspecified: Secondary | ICD-10-CM | POA: Diagnosis not present

## 2023-09-08 MED ORDER — OXYCODONE-ACETAMINOPHEN 10-325 MG PO TABS
1.0000 | ORAL_TABLET | Freq: Four times a day (QID) | ORAL | 0 refills | Status: DC
  Filled 2023-10-17: qty 120, 30d supply, fill #0

## 2023-09-08 MED ORDER — OXYCODONE-ACETAMINOPHEN 10-325 MG PO TABS
1.0000 | ORAL_TABLET | Freq: Four times a day (QID) | ORAL | 0 refills | Status: DC
  Filled 2023-09-12 – 2023-09-15 (×2): qty 120, 30d supply, fill #0

## 2023-09-08 MED ORDER — BACLOFEN 10 MG PO TABS
10.0000 mg | ORAL_TABLET | Freq: Two times a day (BID) | ORAL | 0 refills | Status: DC
  Filled 2023-09-08: qty 180, 90d supply, fill #0

## 2023-09-08 MED ORDER — PREGABALIN 75 MG PO CAPS
75.0000 mg | ORAL_CAPSULE | Freq: Three times a day (TID) | ORAL | 0 refills | Status: DC
  Filled 2023-09-08: qty 270, 90d supply, fill #0

## 2023-09-12 ENCOUNTER — Other Ambulatory Visit (HOSPITAL_COMMUNITY): Payer: Self-pay

## 2023-09-12 DIAGNOSIS — J3089 Other allergic rhinitis: Secondary | ICD-10-CM | POA: Diagnosis not present

## 2023-09-12 DIAGNOSIS — J3081 Allergic rhinitis due to animal (cat) (dog) hair and dander: Secondary | ICD-10-CM | POA: Diagnosis not present

## 2023-09-12 DIAGNOSIS — J301 Allergic rhinitis due to pollen: Secondary | ICD-10-CM | POA: Diagnosis not present

## 2023-09-15 ENCOUNTER — Other Ambulatory Visit (HOSPITAL_COMMUNITY): Payer: Self-pay

## 2023-09-22 DIAGNOSIS — J3081 Allergic rhinitis due to animal (cat) (dog) hair and dander: Secondary | ICD-10-CM | POA: Diagnosis not present

## 2023-09-22 DIAGNOSIS — J3089 Other allergic rhinitis: Secondary | ICD-10-CM | POA: Diagnosis not present

## 2023-09-22 DIAGNOSIS — J301 Allergic rhinitis due to pollen: Secondary | ICD-10-CM | POA: Diagnosis not present

## 2023-09-28 DIAGNOSIS — J3081 Allergic rhinitis due to animal (cat) (dog) hair and dander: Secondary | ICD-10-CM | POA: Diagnosis not present

## 2023-09-28 DIAGNOSIS — J3089 Other allergic rhinitis: Secondary | ICD-10-CM | POA: Diagnosis not present

## 2023-09-28 DIAGNOSIS — J301 Allergic rhinitis due to pollen: Secondary | ICD-10-CM | POA: Diagnosis not present

## 2023-10-04 DIAGNOSIS — J3081 Allergic rhinitis due to animal (cat) (dog) hair and dander: Secondary | ICD-10-CM | POA: Diagnosis not present

## 2023-10-04 DIAGNOSIS — J3089 Other allergic rhinitis: Secondary | ICD-10-CM | POA: Diagnosis not present

## 2023-10-04 DIAGNOSIS — J301 Allergic rhinitis due to pollen: Secondary | ICD-10-CM | POA: Diagnosis not present

## 2023-10-12 ENCOUNTER — Ambulatory Visit: Payer: 59 | Attending: Cardiology | Admitting: Cardiology

## 2023-10-12 ENCOUNTER — Encounter: Payer: Self-pay | Admitting: Cardiology

## 2023-10-12 VITALS — BP 130/82 | HR 82 | Resp 16 | Ht 71.0 in | Wt 257.0 lb

## 2023-10-12 DIAGNOSIS — Z8249 Family history of ischemic heart disease and other diseases of the circulatory system: Secondary | ICD-10-CM

## 2023-10-12 DIAGNOSIS — I10 Essential (primary) hypertension: Secondary | ICD-10-CM | POA: Diagnosis not present

## 2023-10-12 DIAGNOSIS — E782 Mixed hyperlipidemia: Secondary | ICD-10-CM | POA: Diagnosis not present

## 2023-10-12 DIAGNOSIS — R931 Abnormal findings on diagnostic imaging of heart and coronary circulation: Secondary | ICD-10-CM | POA: Diagnosis not present

## 2023-10-12 NOTE — Progress Notes (Signed)
Cardiology Office Note:  .   Date:  10/12/2023  ID:  Ashley [name], DOB 1976/07/31, MRN [id number] PCP:  Ceasar Lund, PA  Former Cardiology Providers: N/A [facility] Providers Cardiologist:  Tessa Lerner, DO , [facility] (established care 05/16/2023) Electrophysiologist:  None  Click to update primary MD,subspecialty MD or APP then REFRESH:1}    Chief Complaint  Patient presents with   Agatston coronary artery calcium score less than 100   Results   Follow-up    History of Present Illness: .   Ashley [name] is a 48 y.o. African-American female whose past medical history and cardiovascular risk factors includes: Mild CAC, hypertension, PCOS, general anxiety disorder, family history of heart disease, hypercholesterolemia, obesity due to excess calories, status post gastric sleeve, history of DVT, aortic atherosclerosis.   Patient was referred to the practice for hypertensive urgency, tachycardia, and family history of heart disease.  In the past her blood pressures have been as high as 210/100 and with medication titration her blood pressures have now improved.  Given her family history of heart disease in her having multiple cardiovascular risk factors she did undergo coronary calcium score which noted mild CAC still placing her at the 98th percentile, she has aortic atherosclerosis, we discussed the risks, benefits, and alternatives to medical therapy at the last office visit.  Shared decision was to start aspirin 81 mg p.o. daily as well as statin therapy.  Given her mild CAC, hyperlipidemia, family history of CAD, and aortic atherosclerosis she did undergo exercise treadmill stress test.  Patient did not achieve 85% of age-predicted maximum heart rate and overall capacity was low.  The GXT was submaximal; however, at the workload performed ECG was negative for ischemia.  She presents today for follow-up.  Since last office visit she has not had any chest pain or heart failure  symptoms.  She is not checking her blood pressures at home.  Denies any palpitations or tachycardia.  She did well with rosuvastatin and has not complained of any myalgias.  Fasting lipids are still pending.  Review of Systems: .   Review of Systems  Cardiovascular:  Negative for chest pain, claudication, irregular heartbeat, leg swelling, near-syncope, orthopnea, palpitations, paroxysmal nocturnal dyspnea and syncope.  Respiratory:  Negative for shortness of breath.   Hematologic/Lymphatic: Negative for bleeding problem.    Studies Reviewed:   EKG: EKG Interpretation Date/Time:  Wednesday October 12 2023 11:03:09 EST Ventricular Rate:  82 PR Interval:  158 QRS Duration:  78 QT Interval:  388 QTC Calculation: 453 R Axis:   -12  Text Interpretation: Normal sinus rhythm Normal ECG When compared with ECG of 20-Jun-2020 11:26, No significant change was found Confirmed by Tessa Lerner [phone]) on 10/12/2023 11:08:46 AM  Echocardiogram: 04/12/2023: Normal LV systolic function with visual EF 60-65%. Left ventricle cavity is normal in size. Normal global wall motion. Normal diastolic filling pattern, normal LAP. Mild left ventricular hypertrophy.  Pericardium is normal. Trace pericardial effusion. There is no hemodynamic significance. No significant valvular heart disease.  No prior study for comparison.  CT Cardiac Scoring: March 30, 2023 Left main: 0 Left anterior descending artery: 78.2 Left circumflex artery: 0 Right coronary artery: 1.98   Total: 80.2 Percentile: 98th   Pericardium: Normal.   Ascending Aorta: Normal caliber.   Noncardiac findings:No acute or significant extracardiac abnormality.   Total CAC 80.2 AU, 98th percentile for patient's age, sex, and race.   Exercise treadmill stress test 05/20/2023: Exercise treadmill stress test  performed using Bruce protocol.  Patient exercised for a total of 4 minutes and 10 seconds, achieving 6.0 METS, and 83% of age predicted  maximum heart rate.  Exercise capacity was low.  No chest pain reported.  Near optimal heart rate and hemodynamic response. Stress EKG revealed no ischemic changes. Low risk study.  RADIOLOGY: CT LUMBAR SPINE WO CONTRAST July 2019 Aortic atherosclerosis See report for additional details.   Risk Assessment/Calculations:   NA   Labs:    External Labs: Collected: 02/16/2023 Serum iron 39 (low) Transferring to 38. Iron sats 12 (low) TIBC 333 Total cholesterol 213, triglycerides 41, HDL 84, LDL calculated 122, non-HDL 129 BUN 13, creatinine 0.85. Sodium 142, potassium 4.4, chloride 106, bicarb 32. AST 14, ALT 12, alkaline phosphatase 83. Hemoglobin 11.9, hematocrit 37.3%. TSH 0.57  Physical Exam:    Today's Vitals   10/12/23 1059  BP: 130/82  Pulse: 82  Resp: 16  SpO2: 97%  Weight: 257 lb (116.6 kg)  Height: 5\' 11"  (1.803 m)   Body mass index is 35.84 kg/m. Wt Readings from Last 3 Encounters:  10/12/23 257 lb (116.6 kg)  05/16/23 247 lb (112 kg)  03/17/23 243 lb (110.2 kg)    Physical Exam  Constitutional: No distress.  Age appropriate, hemodynamically stable.   Neck: No JVD present.  Cardiovascular: Normal rate, regular rhythm, S1 normal, S2 normal, intact distal pulses and normal pulses. Exam reveals no gallop, no S3 and no S4.  No murmur heard. Pulmonary/Chest: Effort normal and breath sounds normal. No stridor. She has no wheezes. She has no rales.  Abdominal: Soft. Bowel sounds are normal. She exhibits no distension. There is no abdominal tenderness.  Musculoskeletal:        General: No edema.     Cervical back: Neck supple.  Neurological: She is alert and oriented to person, place, and time. She has intact cranial nerves (2-12).  Skin: Skin is warm and moist.     Impression & Recommendation(s):  Impression:   ICD-10-CM   1. Agatston coronary artery calcium score less than 100  R93.1 EKG 12-Lead    Lipid Panel With LDL/HDL Ratio    LDL cholesterol,  direct    CMP14+EGFR    2. Family history of premature CAD  Z82.49     3. Mixed hyperlipidemia  E78.2 Lipid Panel With LDL/HDL Ratio    LDL cholesterol, direct    CMP14+EGFR    4. Benign hypertension  I10        Recommendation(s):  Agatston coronary artery calcium score less than 100 Family history of premature CAD Total CAC 80.2, 98th percentile. Strong family history of CAD. Though her coronary calcium score is mild she is currently at the 98th percentile when compared to her peers, strong family history of CAD, aortic atherosclerosis shared decision was to be on statin therapy.  She was started on rosuvastatin 10 mg p.o. daily she is tolerating the medication well without any side effects or intolerances. Patient also had a GXT since last office visit which was suboptimal as she did not achieve 85% of age-predicted maximum heart rate.  However, at the workload performed stress ECG is negative for ischemia. Reemphasized the importance of secondary prevention with focus on improving her modifiable cardiovascular risk factors such as glycemic control, lipid management, blood pressure control, weight loss.  Mixed hyperlipidemia Currently on rosuvastatin 10 mg p.o. daily She denies myalgia or other side effects. Recheck fasting lipids after starting statin therapy.     Benign  hypertension Blood pressures have improved significantly with medication titration. Patient is advised to check her blood pressures at home to make sure the numbers are well-controlled. Continue amlodipine 5 mg p.o. daily. Continue Lasix 20 mg p.o. as needed for leg swelling Continue Hyzaar 100/12.5 mg p.o. daily Reemphasized importance of low-salt diet.  Since her GXT was submaximal I would still like to see her in follow-up.  In the interim I have asked her to start increasing her physical activity as tolerated with a goal of 30 minutes a day 5 days a week.  If she has exertional chest pain or worsening  shortness of breath additional workup could be considered.  However as long as she remains asymptomatic we will hold off on additional cardiovascular testing at this time.  I will see her back in 1 year to see how she is doing and if stable at that time she could be seen on as needed basis.  Patient is agreeable with the plan.  Orders Placed:  Orders Placed This Encounter  Procedures   EKG 12-Lead   Discussed management of at least 2 chronic comorbid conditions, reviewed results of the coronary calcium score as well as a GXT, ordered 2 unique test, coordination of care, discussing disease management and longitudinal follow-up.   Final Medication List:   No orders of the defined types were placed in this encounter.   Medications Discontinued During This Encounter  Medication Reason   valACYclovir (VALTREX) 500 MG tablet Patient Preference   pregabalin (LYRICA) 75 MG capsule    fluticasone (FLONASE) 50 MCG/ACT nasal spray Patient Preference   baclofen (LIORESAL) 10 MG tablet    baclofen (LIORESAL) 10 MG tablet    hydrocortisone-pramoxine (ANALPRAM-HC) 2.5-1 % rectal cream Patient Preference     Current Outpatient Medications:    Albuterol Sulfate (PROAIR RESPICLICK) 108 (90 Base) MCG/ACT AEPB, Inhale 1 puff into the lungs every 4 (four) hours as needed., Disp: 1 each, Rfl: 0   amLODipine (NORVASC) 5 MG tablet, , Disp: , Rfl:    aspirin EC 81 MG tablet, Take 1 tablet (81 mg total) by mouth daily. Swallow whole., Disp: 30 tablet, Rfl: 12   baclofen (LIORESAL) 10 MG tablet, Take 3 tablets (30 mg total) by mouth 3 (three) times daily. (Patient taking differently: Take 10 mg by mouth 2 (two) times daily.), Disp: 270 each, Rfl: 1   buPROPion (WELLBUTRIN XL) 300 MG 24 hr tablet, Take 300 mg by mouth daily., Disp: , Rfl:    cetirizine (ZYRTEC) 10 MG tablet, Take 10 mg by mouth daily as needed for allergies. , Disp: , Rfl:    docusate sodium (COLACE) 100 MG capsule, 1 capsule as needed for mild  constipation, Disp: , Rfl:    EPINEPHrine 0.3 mg/0.3 mL IJ SOAJ injection, Inject 3 mLs into the muscle once., Disp: , Rfl: 0   furosemide (LASIX) 20 MG tablet, Take 20 mg by mouth daily as needed for edema., Disp: , Rfl:    levocetirizine (XYZAL ALLERGY 24HR) 5 MG tablet, 1 tablet in the evening, Disp: , Rfl:    lidocaine (LIDODERM) 5 %, Place 1 patch onto the skin 2 (two) times daily., Disp: , Rfl:    losartan-hydrochlorothiazide (HYZAAR) 100-12.5 MG tablet, Take 1 tablet by mouth daily., Disp: , Rfl:    Multiple Vitamins-Minerals (MULTIVITAMIN WITH MINERALS) tablet, Take 1 tablet daily by mouth., Disp: , Rfl:    oxyCODONE-acetaminophen (PERCOCET) 10-325 MG tablet, Take 1 tablet by mouth every 4 (four) hours as  needed. (Patient taking differently: Take 1 tablet by mouth every 6 (six) hours as needed for pain.), Disp: 60 tablet, Rfl: 0   oxyCODONE-acetaminophen (PERCOCET) 10-325 MG tablet, Take 1 tablet by mouth every 6 (six) hours., Disp: 120 tablet, Rfl: 0   oxyCODONE-acetaminophen (PERCOCET) 10-325 MG tablet, Take 1 tablet by mouth every six hours as directed 10/07/23, Disp: 120 tablet, Rfl: 0   pregabalin (LYRICA) 75 MG capsule, Take 75 mg by mouth 3 (three) times daily., Disp: , Rfl:    rosuvastatin (CRESTOR) 10 MG tablet, Take 1 tablet (10 mg total) by mouth daily., Disp: 90 tablet, Rfl: 3   VYVANSE 50 MG capsule, Take 50 mg by mouth every morning., Disp: , Rfl:   Consent:   NA  Disposition:   1 year sooner if needed  Her questions and concerns were addressed to her satisfaction. She voices understanding of the recommendations provided during this encounter.    Signed, Tessa Lerner, DO, [facility]  [facility]  [address] [place], [place] [address] 10/12/2023 11:32 AM

## 2023-10-12 NOTE — Patient Instructions (Signed)
Medication Instructions:  NONE *If you need a refill on your cardiac medications before your next appointment, please call your pharmacy*   Lab Work: Lipid Panel LDL CMP14+EGFR  If you have labs (blood work) drawn today and your tests are completely normal, you will receive your results only by: MyChart Message (if you have MyChart) OR A paper copy in the mail If you have any lab test that is abnormal or we need to change your treatment, we will call you to review the results.   Testing/Procedures: NONE   Follow-Up: At [facility], you and your health needs are our priority.  As part of our continuing mission to provide you with exceptional heart care, we have created designated Provider Care Teams.  These Care Teams include your primary Cardiologist (physician) and Advanced Practice Providers (APPs -  Physician Assistants and Nurse Practitioners) who all work together to provide you with the care you need, when you need it.  We recommend signing up for the patient portal called "MyChart".  Sign up information is provided on this After Visit Summary.  MyChart is used to connect with patients for Virtual Visits (Telemedicine).  Patients are able to view lab/test results, encounter notes, upcoming appointments, etc.  Non-urgent messages can be sent to your provider as well.   To learn more about what you can do with MyChart, go to [email or web address].    Your next appointment:   1 year(s)  Provider:   Tessa Lerner, DO

## 2023-10-14 DIAGNOSIS — J3081 Allergic rhinitis due to animal (cat) (dog) hair and dander: Secondary | ICD-10-CM | POA: Diagnosis not present

## 2023-10-14 DIAGNOSIS — J3089 Other allergic rhinitis: Secondary | ICD-10-CM | POA: Diagnosis not present

## 2023-10-14 DIAGNOSIS — J301 Allergic rhinitis due to pollen: Secondary | ICD-10-CM | POA: Diagnosis not present

## 2023-10-17 ENCOUNTER — Other Ambulatory Visit (HOSPITAL_COMMUNITY): Payer: Self-pay

## 2023-10-20 DIAGNOSIS — J3089 Other allergic rhinitis: Secondary | ICD-10-CM | POA: Diagnosis not present

## 2023-10-20 DIAGNOSIS — J301 Allergic rhinitis due to pollen: Secondary | ICD-10-CM | POA: Diagnosis not present

## 2023-10-20 DIAGNOSIS — J3081 Allergic rhinitis due to animal (cat) (dog) hair and dander: Secondary | ICD-10-CM | POA: Diagnosis not present

## 2023-10-21 DIAGNOSIS — E782 Mixed hyperlipidemia: Secondary | ICD-10-CM | POA: Diagnosis not present

## 2023-10-21 DIAGNOSIS — R931 Abnormal findings on diagnostic imaging of heart and coronary circulation: Secondary | ICD-10-CM | POA: Diagnosis not present

## 2023-10-22 LAB — LDL CHOLESTEROL, DIRECT: LDL Direct: 99 mg/dL (ref 0–99)

## 2023-10-22 LAB — CMP14+EGFR
ALT: 11 IU/L (ref 0–32)
AST: 15 IU/L (ref 0–40)
Albumin: 4 g/dL (ref 3.9–4.9)
Alkaline Phosphatase: 108 IU/L (ref 44–121)
BUN/Creatinine Ratio: 10 (ref 9–23)
BUN: 8 mg/dL (ref 6–24)
Bilirubin Total: 0.4 mg/dL (ref 0.0–1.2)
CO2: 25 mmol/L (ref 20–29)
Calcium: 8.9 mg/dL (ref 8.7–10.2)
Chloride: 102 mmol/L (ref 96–106)
Creatinine, Ser: 0.8 mg/dL (ref 0.57–1.00)
Globulin, Total: 3.6 g/dL (ref 1.5–4.5)
Glucose: 75 mg/dL (ref 70–99)
Potassium: 4.4 mmol/L (ref 3.5–5.2)
Sodium: 139 mmol/L (ref 134–144)
Total Protein: 7.6 g/dL (ref 6.0–8.5)
eGFR: 91 mL/min/{1.73_m2} (ref >59)

## 2023-10-22 LAB — LIPID PANEL WITH LDL/HDL RATIO
Cholesterol, Total: 175 mg/dL (ref 100–199)
HDL: 58 mg/dL (ref >39)
LDL Chol Calc (NIH): 106 mg/dL — ABNORMAL HIGH (ref 0–99)
LDL/HDL Ratio: 1.8 {ratio} (ref 0.0–3.2)
Triglycerides: 57 mg/dL (ref 0–149)
VLDL Cholesterol Cal: 11 mg/dL (ref 5–40)

## 2023-10-25 ENCOUNTER — Ambulatory Visit: Payer: 59 | Admitting: Cardiology

## 2023-10-27 DIAGNOSIS — Z903 Acquired absence of stomach [part of]: Secondary | ICD-10-CM | POA: Diagnosis not present

## 2023-10-27 DIAGNOSIS — J3089 Other allergic rhinitis: Secondary | ICD-10-CM | POA: Diagnosis not present

## 2023-10-27 DIAGNOSIS — J3081 Allergic rhinitis due to animal (cat) (dog) hair and dander: Secondary | ICD-10-CM | POA: Diagnosis not present

## 2023-10-27 DIAGNOSIS — Z1321 Encounter for screening for nutritional disorder: Secondary | ICD-10-CM | POA: Diagnosis not present

## 2023-10-27 DIAGNOSIS — R5383 Other fatigue: Secondary | ICD-10-CM | POA: Diagnosis not present

## 2023-10-27 DIAGNOSIS — E569 Vitamin deficiency, unspecified: Secondary | ICD-10-CM | POA: Diagnosis not present

## 2023-10-27 DIAGNOSIS — J301 Allergic rhinitis due to pollen: Secondary | ICD-10-CM | POA: Diagnosis not present

## 2023-11-01 DIAGNOSIS — Z0289 Encounter for other administrative examinations: Secondary | ICD-10-CM

## 2023-11-02 ENCOUNTER — Encounter: Payer: Self-pay | Admitting: Family Medicine

## 2023-11-02 ENCOUNTER — Ambulatory Visit (INDEPENDENT_AMBULATORY_CARE_PROVIDER_SITE_OTHER): Payer: 59 | Admitting: Family Medicine

## 2023-11-02 VITALS — BP 143/83 | HR 86 | Temp 98.0°F | Ht 71.5 in | Wt 254.0 lb

## 2023-11-02 DIAGNOSIS — Z6834 Body mass index (BMI) 34.0-34.9, adult: Secondary | ICD-10-CM | POA: Diagnosis not present

## 2023-11-02 DIAGNOSIS — J301 Allergic rhinitis due to pollen: Secondary | ICD-10-CM | POA: Diagnosis not present

## 2023-11-02 DIAGNOSIS — J3081 Allergic rhinitis due to animal (cat) (dog) hair and dander: Secondary | ICD-10-CM | POA: Diagnosis not present

## 2023-11-02 DIAGNOSIS — M5416 Radiculopathy, lumbar region: Secondary | ICD-10-CM | POA: Diagnosis not present

## 2023-11-02 DIAGNOSIS — J3089 Other allergic rhinitis: Secondary | ICD-10-CM | POA: Diagnosis not present

## 2023-11-02 DIAGNOSIS — R058 Other specified cough: Secondary | ICD-10-CM | POA: Diagnosis not present

## 2023-11-02 DIAGNOSIS — Z903 Acquired absence of stomach [part of]: Secondary | ICD-10-CM | POA: Diagnosis not present

## 2023-11-02 DIAGNOSIS — F3341 Major depressive disorder, recurrent, in partial remission: Secondary | ICD-10-CM | POA: Diagnosis not present

## 2023-11-02 DIAGNOSIS — H1045 Other chronic allergic conjunctivitis: Secondary | ICD-10-CM | POA: Diagnosis not present

## 2023-11-02 DIAGNOSIS — E66811 Obesity, class 1: Secondary | ICD-10-CM

## 2023-11-02 NOTE — Progress Notes (Signed)
Office: [phone]  /  Fax: [phone]   Initial Visit  Ashley [name] was seen in clinic today to evaluate for obesity. She is interested in losing weight to improve overall health and reduce the risk of weight related complications. She presents today to review program treatment options, initial physical assessment, and evaluation.     She was referred by: Specialist  When asked what else they would like to accomplish? She states: Adopt healthier eating patterns, Improve existing medical conditions, Improve quality of life, and Improve appearance  Weight history:  she has had a long time hx of obesity s/p robotic sleeve gastrectomy with Dr Sallyanne Havers July 2022.  Pre op weight 308 lb and nadir weight 238 lb.  Has regained weight in the past 6-12 mos due to inactivity from chronic pain, over- snacking, consuming junk food snacks (grazing at night), over-filling gastric pouch at mealtime and depressed mood. She felt better at 238 lb, able to move around better and would like to get back down to that range.  When asked how has your weight affected you? She states: Contributed to medical problems, Contributed to orthopedic problems or mobility issues, Having fatigue, Having poor endurance, and Has affected mood   Some associated conditions: Hypertension and Arthritis:back and hips PCOS, depression  Contributing factors: Family history of obesity, Consumption of processed foods, Moderate to high levels of stress, Reduced physical activity, and Eating patterns  Weight promoting medications identified: None  Current nutrition plan: None  Current level of physical activity: Limited due to chronic pain or orthopedic problems  Current or previous pharmacotherapy: Buproprion  Response to medication: Never tried medications Admits to poor compliance taking Buproprion which does help with mood  Past medical history includes:   Past Medical History:  Diagnosis Date   Arthritis    knees, back    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  Peak [id number] , Body Fat%:44.8, Visceral Fat Rating:11, Weight trend over the last 12 months: Increasing  General:  Alert, oriented and cooperative. Patient is in no acute distress.  Respiratory: Normal respiratory effort, no problems with respiration noted   Gait: able to ambulate independently  Mental Status: Normal mood and affect. Normal behavior. Normal judgment and thought content.   DIAGNOSTIC DATA REVIEWED:  BMET    Component Value Date/Time   NA 139 10/21/2023 1054   K 4.4 10/21/2023 1054   CL 102 10/21/2023 1054   CO2 25 10/21/2023 1054   GLUCOSE 75 10/21/2023 1054   GLUCOSE 86 04/03/2021 0935   BUN 8 10/21/2023 1054   CREATININE 0.80 10/21/2023 1054   CALCIUM 8.9 10/21/2023 1054   CALCIUM 8.3 (L) 08/28/2010 0525   GFRNONAA >60 04/14/2021 1051   GFRAA >60 11/24/2018 0731   Lab Results  Component Value Date   HGBA1C 6.1 (H) 04/01/2015   No results found for: "INSULIN" CBC  Component Value Date/Time   WBC 5.7 04/16/2021 0453   RBC 4.51 04/16/2021 0453   HGB 11.1 (L) 04/16/2021 0453   HGB 11.6 12/30/2010 1342   HCT 36.3 04/16/2021 0453   HCT 35.3 12/30/2010 1342   PLT 195 04/16/2021 0453   PLT 241 12/30/2010 1342   MCV 80.5 04/16/2021 0453   MCV 77.9 (A) 03/31/2015 2014   MCV 78.4 (L) 12/30/2010 1342   MCH 24.6 (L) 04/16/2021 0453   MCHC 30.6 04/16/2021 0453   RDW 16.7 (H) 04/16/2021 0453   RDW 19.5 (H) 12/30/2010 1342   Iron/TIBC/Ferritin/ %Sat    Component Value  Date/Time   IRON 21 (L) 08/29/2010 0500   TIBC 365 08/29/2010 0500   FERRITIN 26 08/29/2010 0500   IRONPCTSAT 6 (L) 08/29/2010 0500   Lipid Panel     Component Value Date/Time   CHOL 175 10/21/2023 1058   TRIG 57 10/21/2023 1058   HDL 58 10/21/2023 1058   LDLCALC 106 (H) 10/21/2023 1058   LDLDIRECT 99 10/21/2023 1058   Hepatic Function Panel     Component Value Date/Time   PROT 7.6 10/21/2023 1054   ALBUMIN 4.0 10/21/2023 1054   AST 15 10/21/2023 1054   ALT 11 10/21/2023 1054   ALKPHOS 108 10/21/2023 1054   BILITOT 0.4 10/21/2023 1054   BILIDIR 0.2 06/04/2010 1529   IBILI 0.4 06/04/2010 1529      Component Value Date/Time   TSH 1.718 04/01/2015 1440     Assessment and Plan:   Recurrent major depressive disorder, in partial remission (HCC) Currently on Buproprion XL 300 mg daily with poor compliance Has a good support system at home Has seen an increase in emotional eating and lack of sleep which have contributed to weight gain  Plan: continue psychotherapy Improve compliance taking medication as directed  Status post sleeve gastrectomy Reviewed notes from [facility]. Will review labs to include all of her post bariatric surgery labs  Plan: will be working on getting back into bariatric surgery rules of eating   Class 1 obesity due to excess calories with body mass index (BMI) of 34.0 to 34.9 in adult, unspecified whether serious comorbidity present  Lumbar radiculopathy Lumbar radiculopathy have been a barrier to her physical activity which has only worsened her exercise and contributed to further weight re- gain.    Plan: look for improving back and weight - bearing joint pain with weight loss.  Consider water exercise, yoga, strength training   Obesity Treatment / Action Plan:  Patient will work on garnering support from family and friends to begin weight loss journey. Will work on eliminating or reducing the presence of highly palatable, calorie dense foods in  the home. Will complete provided nutritional and psychosocial assessment questionnaire before the next appointment. Will be scheduled for indirect calorimetry to determine resting energy expenditure in a fasting state.  This will allow [place] to create a reduced calorie, high-protein meal plan to promote loss of fat mass while preserving muscle mass. Will think about ideas on how to incorporate physical activity into their daily routine. Counseled on the health benefits of losing 5%-15% of total body weight. Was counseled on nutritional approaches to weight loss and benefits of reducing processed foods and consuming plant-based foods and high quality protein as part of nutritional weight management. Was counseled on pharmacotherapy and role as an adjunct in weight management.   Obesity Education Performed Today:  She was weighed on the bioimpedance scale and results were discussed and documented  in the synopsis.  We discussed obesity as a disease and the importance of a more detailed evaluation of all the factors contributing to the disease.  We discussed the importance of long term lifestyle changes which include nutrition, exercise and behavioral modifications as well as the importance of customizing this to her specific health and social needs.  We discussed the benefits of reaching a healthier weight to alleviate the symptoms of existing conditions and reduce the risks of the biomechanical, metabolic and psychological effects of obesity.  [name] appears to be in the action stage of change and states they are ready to start intensive lifestyle modifications and behavioral modifications.  30 minutes was spent today on this visit including the above counseling, pre-visit chart review, and post-visit documentation.  Reviewed by clinician on day of visit: allergies, medications, problem list, medical history, surgical history, family history, social history, and previous encounter notes  pertinent to obesity diagnosis.    Seymour Bars, D.O. DABFM, [facility] [address] [place], [place] [address] [phone]

## 2023-11-03 ENCOUNTER — Encounter: Payer: Self-pay | Admitting: Family Medicine

## 2023-11-03 ENCOUNTER — Ambulatory Visit (INDEPENDENT_AMBULATORY_CARE_PROVIDER_SITE_OTHER): Payer: 59 | Admitting: Family Medicine

## 2023-11-03 VITALS — BP 122/79 | HR 76 | Temp 98.0°F | Ht 71.5 in | Wt 254.0 lb

## 2023-11-03 DIAGNOSIS — F3341 Major depressive disorder, recurrent, in partial remission: Secondary | ICD-10-CM

## 2023-11-03 DIAGNOSIS — Z903 Acquired absence of stomach [part of]: Secondary | ICD-10-CM | POA: Diagnosis not present

## 2023-11-03 DIAGNOSIS — M5416 Radiculopathy, lumbar region: Secondary | ICD-10-CM | POA: Diagnosis not present

## 2023-11-03 DIAGNOSIS — R0602 Shortness of breath: Secondary | ICD-10-CM

## 2023-11-03 DIAGNOSIS — Z6834 Body mass index (BMI) 34.0-34.9, adult: Secondary | ICD-10-CM

## 2023-11-03 DIAGNOSIS — K912 Postsurgical malabsorption, not elsewhere classified: Secondary | ICD-10-CM

## 2023-11-03 DIAGNOSIS — R5383 Other fatigue: Secondary | ICD-10-CM

## 2023-11-03 DIAGNOSIS — R7303 Prediabetes: Secondary | ICD-10-CM | POA: Insufficient documentation

## 2023-11-03 DIAGNOSIS — E6609 Other obesity due to excess calories: Secondary | ICD-10-CM

## 2023-11-03 DIAGNOSIS — F32A Depression, unspecified: Secondary | ICD-10-CM

## 2023-11-03 DIAGNOSIS — E66811 Obesity, class 1: Secondary | ICD-10-CM

## 2023-11-03 MED ORDER — PHENTERMINE HCL 37.5 MG PO TABS
ORAL_TABLET | ORAL | 0 refills | Status: DC
Start: 2023-11-03 — End: 2023-11-03

## 2023-11-03 MED ORDER — PHENTERMINE HCL 37.5 MG PO TABS
ORAL_TABLET | ORAL | 0 refills | Status: DC
Start: 2023-11-03 — End: 2023-11-21

## 2023-11-03 NOTE — Progress Notes (Signed)
+  At a Glance:  Vitals Temp: 98 F (36.7 C) BP: 122/79 Pulse Rate: 76 SpO2: 100 %   Anthropometric Measurements Height: 5' 11.5" (1.816 m) Weight: 254 lb (115.2 kg) BMI (Calculated): 34.94 Weight at Last Visit: 254lb Weight Lost Since Last Visit: 0lb Weight Gained Since Last Visit: 0lb Starting Weight: 254lb Total Weight Loss (lbs): 0 lb (0 kg) Peak Weight: 254lb Waist Measurement : 44 inches   Body Composition  Body Fat %: 44.4 % Fat Mass (lbs): 112.8 lbs Muscle Mass (lbs): 134 lbs Total Body Water (lbs): 95 lbs Visceral Fat Rating : 11   Other Clinical Data RMR: 2045 Fasting: Yes Labs: Yes Today's Visit #: 1    EKG: reviewed from 10/12/23.  Indirect Calorimeter completed today shows a VO2 of 296 and a [name] of 2045.  Her calculated basal metabolic rate is 4098 thus her basal metabolic rate is better than expected.  Chief Complaint:  Obesity   Subjective:  Ashley [name] (MR# [id number]) is a 48 y.o. female who presents for evaluation and treatment of obesity and related comorbidities.   Ashley [name] is currently in the action stage of change and ready to dedicate time achieving and maintaining a healthier weight. Ashley [name] is interested in becoming our patient and working on intensive lifestyle modifications including (but not limited to) diet and exercise for weight loss.  Ashley [name] has been struggling with her weight. She has been unsuccessful in either losing weight, maintaining weight loss, or reaching her healthy weight goal.  Weight history:  she has had a long time hx of obesity s/p robotic sleeve gastrectomy with Dr Sallyanne Havers July 2022.  Pre op weight 308 lb and nadir weight 238 lb.  Has regained weight in the past 6-12 mos due to inactivity from chronic pain, over- snacking, consuming junk food snacks (grazing at night), over-filling gastric pouch at mealtime and depressed mood. She felt better at 238 lb, able to move around better and would like to get back down to  that range.   Karesa's habits were reviewed today and are as follows: she struggles with family and or coworkers weight loss sabotage, her desired weight is 225 lb. her heaviest weight ever was 312 pounds, she has significant food cravings issues, she snacks frequently in the evenings, she is frequently drinking liquids with calories, she has problems with excessive hunger, and she struggles with emotional eating.   Other Fatigue Ashley [name] admits to daytime somnolence and admits to waking up still tired. Patient has a history of symptoms of daytime fatigue. Ashley [name] generally gets 4 or 5 hours of sleep per night, and states that she has nightime awakenings. Snoring is not present. Apneic episodes are not present. Epworth Sleepiness Score is 5.  Reports previous polysomnogram was normal.  History of chronic insomnia.  Shortness of Breath Jobina notes increasing shortness of breath with exercising and seems to be worsening over time with weight gain. She notes getting out of breath sooner with activity than she used to. This has gotten worse recently. Ashley [name] denies shortness of breath at rest or orthopnea.   Depression Screen Betrice's Food and Mood (modified PHQ-9) score was 8.     07/08/2020    8:54 AM  Depression screen PHQ 2/9  Decreased Interest 0  Down, Depressed, Hopeless 0  PHQ - 2 Score 0     Assessment and Plan:   Other Fatigue Ashley [name] does feel that her weight is causing her energy to be lower than it should be. Fatigue  may be related to obesity, depression or many other causes. Labs will be ordered, and in the meanwhile, Ashley [name] will focus on self care including making healthy food choices, increasing physical activity and focusing on stress reduction.  Shortness of Breath Samaiyah does feel that she gets out of breath more easily that she used to when she exercises. Tishia's shortness of breath appears to be obesity related and exercise induced. She has agreed to work on weight loss and  gradually increase exercise to treat her exercise induced shortness of breath. Will continue to monitor closely.  Jahnaya had a positive depression screening. Depression is commonly associated with obesity and often results in emotional eating behaviors. We will monitor this closely and work on CBT to help improve the non-hunger eating patterns. Referral to Psychology may be required if no improvement is seen as she continues in our clinic.    Problem List Items Addressed This Visit     Obesity Discussed plan of care with patient.  She has regained weight status post sleeve gastrectomy, consuming large portion sizes at mealtime and consuming excess amounts of starches and sweet. Chronic pain and depression have been barriers to her progress She is no longer taking Vyvanse on a daily basis but had previously enjoyed appetite control from it.  She has good candidate to change over to phentermine 37.5 mg one half tab 30 minutes before breakfast daily.  PDMP reviewed.  Informed consent signed.   Relevant Medications   phentermine (ADIPEX-P) 37.5 MG tablet   Depression PHQ-9 elevated at 8.  She is in counseling and has a good support system.  Her sleep is chronically poor.  Look for improvements in mood with improved nutrition, regular physical activity and weight reduction.  Follow-up with PCP/psychiatry for further management   Relevant Medications   busPIRone (BUSPAR) 10 MG tablet   Lumbar radiculopathy Chronic back pain has been a hindrance to her ability to exercise.  She has had an extensive workup and is currently on baclofen 30 mg 3 times daily, oxycodone/acetaminophen 4 times a day as needed and Lyrica 75 mg, 3 times daily.  Notably, Lyrica may be contributing to some weight gain.  Consider exercise options like water exercise or yoga.  Consider referral to PT as needed   Relevant Medications   busPIRone (BUSPAR) 10 MG tablet   phentermine (ADIPEX-P) 37.5 MG tablet   Status post sleeve  gastrectomy she has a little volume control at mealtime from her previous sleeve gastrectomy She is grazing on snack foods between meals and after dinner She is consuming high sugar liquid calories and excess amounts of sugar in her diet with a lack of lean protein and fiber She has not been consistent with exercise due to chronic pain She is not taking supplements on a regular basis  Will be working on eating on a schedule, smaller portion sizes at mealtime and a focus on lean protein and fiber with meals or reducing added sugar.  Will in the future look for forms of exercise conducive with her chronic pain.  Obtain labs looking at her vitamin levels today.   Prediabetes Lab Results  Component Value Date   HGBA1C 6.1 (H) 04/01/2015  She has tested in the prediabetic range and is concerned about developing type 2 diabetes.  She is at risk for type 2 diabetes with a high consumption of sugar and carbs and lack of physical activity along with a BMI of 34.  Will obtain a fasting insulin level today.  begin reducing intake of added sugar and refined carbohydrates.  Consider referral to a DPP.    Other Visit Diagnoses       SOB (shortness of breath)    -  Primary     Postoperative malabsorption       Relevant Orders   VITAMIN D 25 Hydroxy (Vit-D Deficiency, Fractures)   Vitamin B12   Folate   Prealbumin   Ferritin   Iron and TIBC     Other fatigue       Relevant Orders   CBC   Hemoglobin A1c   Insulin, random   TSH Rfx on Abnormal to Free T4       Ashley [name] is currently in the action stage of change and her goal is to continue with weight loss efforts. I recommend Ashley [name] begin the structured treatment plan as follows:  She has agreed to keeping a food journal and adhering to recommended goals of 1800 calories and 100 to 130 g of protein and following a lower carbohydrate, vegetable and lean protein rich diet plan  Exercise goals: All adults should avoid inactivity. Some activity is  better than none, and adults who participate in any amount of physical activity, gain some health benefits. Chronic pain is a rate limiting factor to her current level physical activity  Behavioral modification strategies:increasing lean protein intake, increasing vegetables, increase H2O intake, decrease liquid calories, decrease ETOH, decreasing eating out, no skipping meals, meal planning and cooking strategies, keeping healthy foods in the home, better snacking choices, avoiding temptations, and planning for success Dining out guide given 100-calorie snack list given Avoid high sugar foods and drinks Track daily caloric intake using the my fitness pal app  She was informed of the importance of frequent follow-up visits to maximize her success with intensive lifestyle modifications for her multiple health conditions. She was informed we would discuss her lab results at her next visit unless there is a critical issue that needs to be addressed sooner. Ashley [name] agreed to keep her next visit at the agreed upon time to discuss these results.  Objective:  General: Cooperative, alert, well developed, in no acute distress. HEENT: Conjunctivae and lids unremarkable. Cardiovascular: Regular rhythm.  Lungs: Normal work of breathing. Neurologic: No focal deficits.   Lab Results  Component Value Date   CREATININE 0.80 10/21/2023   BUN 8 10/21/2023   NA 139 10/21/2023   K 4.4 10/21/2023   CL 102 10/21/2023   CO2 25 10/21/2023   Lab Results  Component Value Date   ALT 11 10/21/2023   AST 15 10/21/2023   ALKPHOS 108 10/21/2023   BILITOT 0.4 10/21/2023   Lab Results  Component Value Date   HGBA1C 6.1 (H) 04/01/2015   No results found for: "INSULIN" Lab Results  Component Value Date   TSH 1.718 04/01/2015   Lab Results  Component Value Date   CHOL 175 10/21/2023   HDL 58 10/21/2023   LDLCALC 106 (H) 10/21/2023   LDLDIRECT 99 10/21/2023   TRIG 57 10/21/2023   Lab Results  Component  Value Date   WBC 5.7 04/16/2021   HGB 11.1 (L) 04/16/2021   HCT 36.3 04/16/2021   MCV 80.5 04/16/2021   PLT 195 04/16/2021   Lab Results  Component Value Date   IRON 21 (L) 08/29/2010   TIBC 365 08/29/2010   FERRITIN 26 08/29/2010    Attestation Statements:  Reviewed by clinician on day of visit: allergies, medications, problem list, medical history, surgical history, family  history, social history, and previous encounter notes.  Time spent on visit including pre-visit chart review and post-visit charting and care was 50 minutes.   Glennis Brink, DO

## 2023-11-03 NOTE — Patient Instructions (Addendum)
Begin tracking daily intake on the MyFitnessPal Ap Aim for 1700- 1800 cal/ day  This should include 100-130 g of protein each day  Try to eat on a schedule: Breakfast Lunch  Dinner 2 snacks per day  Avoid high sugar foods and drinks Read labels on food and drink for sugar Avoid products with over 8 g of sugar per serving  You can make your own protein smoothies for a meal replacement: Whey protein powder (20-30 g of protein) OR Oikos PRO greek yogurt 1 handful of frozen fruit Unsweetened almond milk OR FairLife milk 2%  Can add spinach, chia seeds for flaxseed for more fiber  Begin Phentermine 1/2 tab 30 min before breakfast daily Stop Vyvanse

## 2023-11-05 LAB — IRON AND TIBC
Iron Saturation: 15 % (ref 15–55)
Iron: 43 ug/dL (ref 27–159)
Total Iron Binding Capacity: 290 ug/dL (ref 250–450)
UIBC: 247 ug/dL (ref 131–425)

## 2023-11-05 LAB — HEMOGLOBIN A1C
Est. average glucose Bld gHb Est-mCnc: 111 mg/dL
Hgb A1c MFr Bld: 5.5 % (ref 4.8–5.6)

## 2023-11-05 LAB — CBC
Hematocrit: 39.1 % (ref 34.0–46.6)
Hemoglobin: 12.3 g/dL (ref 11.1–15.9)
MCH: 25.2 pg — ABNORMAL LOW (ref 26.6–33.0)
MCHC: 31.5 g/dL (ref 31.5–35.7)
MCV: 80 fL (ref 79–97)
Platelets: 248 10*3/uL (ref 150–450)
RBC: 4.89 x10E6/uL (ref 3.77–5.28)
RDW: 15.3 % (ref 11.7–15.4)
WBC: 7.8 10*3/uL (ref 3.4–10.8)

## 2023-11-05 LAB — PREALBUMIN: PREALBUMIN: 17 mg/dL (ref 12–34)

## 2023-11-05 LAB — VITAMIN B12: Vitamin B-12: 452 pg/mL (ref 232–1245)

## 2023-11-05 LAB — FOLATE: Folate: 13.3 ng/mL (ref >3.0)

## 2023-11-05 LAB — INSULIN, RANDOM: INSULIN: 5 u[IU]/mL (ref 2.6–24.9)

## 2023-11-05 LAB — VITAMIN D 25 HYDROXY (VIT D DEFICIENCY, FRACTURES): Vit D, 25-Hydroxy: 26.3 ng/mL — ABNORMAL LOW (ref 30.0–100.0)

## 2023-11-05 LAB — FERRITIN: Ferritin: 193 ng/mL — ABNORMAL HIGH (ref 15–150)

## 2023-11-05 LAB — TSH RFX ON ABNORMAL TO FREE T4: TSH: 0.901 u[IU]/mL (ref 0.450–4.500)

## 2023-11-07 ENCOUNTER — Other Ambulatory Visit (HOSPITAL_COMMUNITY): Payer: Self-pay

## 2023-11-07 DIAGNOSIS — G4700 Insomnia, unspecified: Secondary | ICD-10-CM | POA: Diagnosis not present

## 2023-11-07 DIAGNOSIS — G894 Chronic pain syndrome: Secondary | ICD-10-CM | POA: Diagnosis not present

## 2023-11-07 DIAGNOSIS — M47817 Spondylosis without myelopathy or radiculopathy, lumbosacral region: Secondary | ICD-10-CM | POA: Diagnosis not present

## 2023-11-07 MED ORDER — OXYCODONE-ACETAMINOPHEN 10-325 MG PO TABS
ORAL_TABLET | ORAL | 0 refills | Status: DC
  Filled 2023-12-14 – 2023-12-15 (×2): qty 120, 30d supply, fill #0

## 2023-11-07 MED ORDER — PREGABALIN 75 MG PO CAPS
75.0000 mg | ORAL_CAPSULE | Freq: Three times a day (TID) | ORAL | 0 refills | Status: DC
  Filled 2023-12-14 (×2): qty 270, 90d supply, fill #0

## 2023-11-07 MED ORDER — OXYCODONE-ACETAMINOPHEN 10-325 MG PO TABS
1.0000 | ORAL_TABLET | Freq: Four times a day (QID) | ORAL | 0 refills | Status: DC
  Filled 2023-11-16: qty 120, 30d supply, fill #0

## 2023-11-07 MED ORDER — BACLOFEN 10 MG PO TABS
10.0000 mg | ORAL_TABLET | Freq: Two times a day (BID) | ORAL | 0 refills | Status: DC
  Filled 2023-11-07 – 2023-12-14 (×2): qty 180, 90d supply, fill #0

## 2023-11-08 DIAGNOSIS — J3089 Other allergic rhinitis: Secondary | ICD-10-CM | POA: Diagnosis not present

## 2023-11-08 DIAGNOSIS — J301 Allergic rhinitis due to pollen: Secondary | ICD-10-CM | POA: Diagnosis not present

## 2023-11-08 DIAGNOSIS — J3081 Allergic rhinitis due to animal (cat) (dog) hair and dander: Secondary | ICD-10-CM | POA: Diagnosis not present

## 2023-11-15 DIAGNOSIS — J3089 Other allergic rhinitis: Secondary | ICD-10-CM | POA: Diagnosis not present

## 2023-11-15 DIAGNOSIS — J3081 Allergic rhinitis due to animal (cat) (dog) hair and dander: Secondary | ICD-10-CM | POA: Diagnosis not present

## 2023-11-15 DIAGNOSIS — J301 Allergic rhinitis due to pollen: Secondary | ICD-10-CM | POA: Diagnosis not present

## 2023-11-16 ENCOUNTER — Other Ambulatory Visit (HOSPITAL_COMMUNITY): Payer: Self-pay

## 2023-11-21 ENCOUNTER — Encounter: Payer: Self-pay | Admitting: Family Medicine

## 2023-11-21 ENCOUNTER — Ambulatory Visit (INDEPENDENT_AMBULATORY_CARE_PROVIDER_SITE_OTHER): Payer: 59 | Admitting: Family Medicine

## 2023-11-21 VITALS — BP 145/82 | HR 78 | Temp 97.8°F | Ht 71.5 in | Wt 250.0 lb

## 2023-11-21 DIAGNOSIS — K5903 Drug induced constipation: Secondary | ICD-10-CM

## 2023-11-21 DIAGNOSIS — T505X5A Adverse effect of appetite depressants, initial encounter: Secondary | ICD-10-CM | POA: Diagnosis not present

## 2023-11-21 DIAGNOSIS — Z6834 Body mass index (BMI) 34.0-34.9, adult: Secondary | ICD-10-CM

## 2023-11-21 DIAGNOSIS — Z903 Acquired absence of stomach [part of]: Secondary | ICD-10-CM | POA: Diagnosis not present

## 2023-11-21 DIAGNOSIS — E559 Vitamin D deficiency, unspecified: Secondary | ICD-10-CM | POA: Diagnosis not present

## 2023-11-21 DIAGNOSIS — E66811 Obesity, class 1: Secondary | ICD-10-CM

## 2023-11-21 DIAGNOSIS — R7303 Prediabetes: Secondary | ICD-10-CM

## 2023-11-21 NOTE — Addendum Note (Signed)
 Addended by: Glennis Brink on: 11/21/2023 04:03 PM   Modules accepted: Level of Service

## 2023-11-21 NOTE — Patient Instructions (Addendum)
 Take Miralax 2 doses per day + Stool softener 2 x a day + 100 oz of water daily You can eat 2 prunes per day  If no BM in the next 2-3 days, you can use a Fleets Enema  Stop Phentermine  Use the Lose It ap to log daily intake Aim for 1800 cal/ day This should include 110 g of protein intake daily  Aim for gym workouts (walking, swimming) 3 days/ wk   Continue Bariatric MVI daily + vitamin D3 + k2 2,000 international units  daily  Try swapping out Monkfruit for sugar

## 2023-11-21 NOTE — Progress Notes (Signed)
 Office: [phone]  /  Fax: [phone]  WEIGHT SUMMARY AND BIOMETRICS  Starting Date: 11/03/23  Starting Weight: 254lb   Weight Lost Since Last Visit: 4lb   Vitals Temp: 97.8 F (36.6 C) BP: (!) 145/82 Pulse Rate: 78 SpO2: 96 %   Body Composition  Body Fat %: 44.6 % Fat Mass (lbs): 111.6 lbs Muscle Mass (lbs): 131.8 lbs Total Body Water (lbs): 96.2 lbs Visceral Fat Rating : 11    HPI  Chief Complaint: OBESITY  Ashley [name] is here to discuss her progress with her obesity treatment plan. She is on the keeping a food journal and adhering to recommended goals of 1800 calories and 100-130 protein and states she is following her eating plan approximately 75 % of the time. She states she is exercising 30 minutes 2 times per week.  Interval History:  Since last office visit she is down 4 lb She c/o constipation She tried Miralax and stool softeners with no help Denies straining Denies previous issues with constipation She started Phentermine 1/2 tab last visit which has started to improve appetite She paused Vyvanse She is tracking daily calories most days using the lose it app with her calories that close to 2000/day  She is tracking her calories (Lose It APP)  Pharmacotherapy: Phentermine 37.5 mg one half tab every morning  PHYSICAL EXAM:  Blood pressure (!) 145/82, pulse 78, temperature 97.8 F (36.6 C), height 5' 11.5" (1.816 m), weight 250 lb (113.4 kg), SpO2 96%. Body mass index is 34.38 kg/m.  General: She is overweight, cooperative, alert, well developed, and in no acute distress. PSYCH: Has normal mood, affect and thought process.   Lungs: Normal breathing effort, no conversational dyspnea.   ASSESSMENT AND PLAN  TREATMENT PLAN FOR OBESITY:  Recommended Dietary Goals  Ashley [name] is currently in the action stage of change. As such, her goal is to continue weight management plan. She has agreed to keeping a food journal and adhering to recommended goals  of 1800 calories and 110 g of protein.  Behavioral Intervention  We discussed the following Behavioral Modification Strategies today: increasing lean protein intake to established goals, increasing fiber rich foods, increasing water intake , work on meal planning and preparation, work on tracking and journaling calories using tracking application, keeping healthy foods at home, identifying sources and decreasing liquid calories, decreasing eating out or consumption of processed foods, and making healthy choices when eating convenient foods, practice mindfulness eating and understand the difference between hunger signals and cravings, avoiding temptations and identifying enticing environmental cues, continue to work on implementation of reduced calorie nutritional plan, and continue to work on maintaining a reduced calorie state, getting the recommended amount of protein, incorporating whole foods, making healthy choices, staying well hydrated and practicing mindfulness when eating..  Additional resources provided today: NA  Recommended Physical Activity Goals  Ashley [name] has been advised to work up to 150 minutes of moderate intensity aerobic activity a week and strengthening exercises 2-3 times per week for cardiovascular health, weight loss maintenance and preservation of muscle mass.   She has agreed to Think about enjoyable ways to increase daily physical activity and overcoming barriers to exercise and Increase physical activity in their day and reduce sedentary time (increase NEAT).  Pharmacotherapy changes for the treatment of obesity: Discontinue phentermine  ASSOCIATED CONDITIONS ADDRESSED TODAY  Prediabetes Lab Results  Component Value Date   HGBA1C 5.5 11/03/2023   Reviewed lab results with patient.  She has a history of prediabetes but is  no longer in the prediabetic range.  She has a previous history of metformin intolerance.  Her prediabetes has improved since her vertical sleeve  gastrectomy done in 2022 down from a max weight of 308 pounds.  Continue to work on a low sugar/low starch diet, regular exercise and weight reduction.  Will not restart metformin due to previous history of GI intolerance.  Class 1 obesity due to excess calories with body mass index (BMI) of 34.0 to 34.9 in adult, unspecified whether serious comorbidity present  Status post sleeve gastrectomy She is feeling some volume restriction at mealtime from her vertical sleeve gastrectomy.  She is working on increasing her intake of lean protein.  Reviewed post bariatric surgery labs, obtained at her last visit.  Her protein intake remains low with a prealbumin of 17, optimally 20-40.  We discussed some high-protein food and drink options.  She is compliant with taking a bariatric multivitamin once daily.  She has been working on eating on a schedule with small portion sizes.  Continue current plan of care.  He has room for improvement adding in resistance training and cardio exercise with a goal of 4 days a week  Vitamin D deficiency Last vitamin D Lab Results  Component Value Date   VD25OH 26.3 (L) 11/03/2023   Reviewed lab results with patient.  Her vitamin D level is low.  She is currently not taking a vitamin D supplement but she is taking Tums for calcium post-bariatric surgery.  We discussed that vitamin D is important for energy levels, bone health and immune function.  Begin vitamin D3 at 2000 IU once daily.  Recheck level in the next 6 months  Drug induced constipation New onset following start of phentermine for obesity management.  This is a common side effect of phentermine.  She has struggled to get an adequate water and fiber in her diet.  She is quite picky about vegetables.  Given her constipation and rise of blood pressure, will discontinue phentermine.  She may resume Vyvanse once her bowel movements are regular per her PCP for ADHD. For the treatment of constipation, we discussed  increasing MiraLAX to 17 g 2 times a day, increasing Colace to 2 times a day, increasing water intake to 100 ounces per day, adding 2 prunes daily.  If no bowel movement in the next 2 to 3 days, she may do 1 fleets enema.  Call if any problems or questions   She was informed of the importance of frequent follow up visits to maximize her success with intensive lifestyle modifications for her multiple health conditions.   ATTESTASTION STATEMENTS:  Reviewed by clinician on day of visit: allergies, medications, problem list, medical history, surgical history, family history, social history, and previous encounter notes pertinent to obesity diagnosis.   I have personally spent 30 minutes total time today in preparation, patient care, nutritional counseling and documentation for this visit, including the following: review of clinical lab tests; review of medical tests/procedures/services.      Glennis Brink, DO DABFM, DABOM [facility] [address] [place], [place] [address] [phone]

## 2023-11-22 DIAGNOSIS — J3081 Allergic rhinitis due to animal (cat) (dog) hair and dander: Secondary | ICD-10-CM | POA: Diagnosis not present

## 2023-11-22 DIAGNOSIS — J3089 Other allergic rhinitis: Secondary | ICD-10-CM | POA: Diagnosis not present

## 2023-11-22 DIAGNOSIS — J301 Allergic rhinitis due to pollen: Secondary | ICD-10-CM | POA: Diagnosis not present

## 2023-11-29 DIAGNOSIS — J301 Allergic rhinitis due to pollen: Secondary | ICD-10-CM | POA: Diagnosis not present

## 2023-11-29 DIAGNOSIS — J3089 Other allergic rhinitis: Secondary | ICD-10-CM | POA: Diagnosis not present

## 2023-11-29 DIAGNOSIS — J3081 Allergic rhinitis due to animal (cat) (dog) hair and dander: Secondary | ICD-10-CM | POA: Diagnosis not present

## 2023-12-08 DIAGNOSIS — J3081 Allergic rhinitis due to animal (cat) (dog) hair and dander: Secondary | ICD-10-CM | POA: Diagnosis not present

## 2023-12-08 DIAGNOSIS — J301 Allergic rhinitis due to pollen: Secondary | ICD-10-CM | POA: Diagnosis not present

## 2023-12-08 DIAGNOSIS — J3089 Other allergic rhinitis: Secondary | ICD-10-CM | POA: Diagnosis not present

## 2023-12-09 ENCOUNTER — Other Ambulatory Visit (HOSPITAL_COMMUNITY): Payer: Self-pay

## 2023-12-09 DIAGNOSIS — M25551 Pain in right hip: Secondary | ICD-10-CM | POA: Diagnosis not present

## 2023-12-14 ENCOUNTER — Other Ambulatory Visit (HOSPITAL_COMMUNITY): Payer: Self-pay

## 2023-12-14 DIAGNOSIS — J3089 Other allergic rhinitis: Secondary | ICD-10-CM | POA: Diagnosis not present

## 2023-12-14 DIAGNOSIS — J301 Allergic rhinitis due to pollen: Secondary | ICD-10-CM | POA: Diagnosis not present

## 2023-12-14 DIAGNOSIS — J3081 Allergic rhinitis due to animal (cat) (dog) hair and dander: Secondary | ICD-10-CM | POA: Diagnosis not present

## 2023-12-15 ENCOUNTER — Other Ambulatory Visit (HOSPITAL_COMMUNITY): Payer: Self-pay

## 2023-12-15 ENCOUNTER — Ambulatory Visit: Admitting: Family Medicine

## 2023-12-15 DIAGNOSIS — J3081 Allergic rhinitis due to animal (cat) (dog) hair and dander: Secondary | ICD-10-CM | POA: Diagnosis not present

## 2023-12-15 DIAGNOSIS — J3089 Other allergic rhinitis: Secondary | ICD-10-CM | POA: Diagnosis not present

## 2023-12-15 DIAGNOSIS — J301 Allergic rhinitis due to pollen: Secondary | ICD-10-CM | POA: Diagnosis not present

## 2023-12-20 DIAGNOSIS — M25551 Pain in right hip: Secondary | ICD-10-CM | POA: Diagnosis not present

## 2023-12-20 DIAGNOSIS — J301 Allergic rhinitis due to pollen: Secondary | ICD-10-CM | POA: Diagnosis not present

## 2023-12-20 DIAGNOSIS — J3089 Other allergic rhinitis: Secondary | ICD-10-CM | POA: Diagnosis not present

## 2023-12-20 DIAGNOSIS — J3081 Allergic rhinitis due to animal (cat) (dog) hair and dander: Secondary | ICD-10-CM | POA: Diagnosis not present

## 2023-12-22 DIAGNOSIS — Z1231 Encounter for screening mammogram for malignant neoplasm of breast: Secondary | ICD-10-CM | POA: Diagnosis not present

## 2023-12-22 DIAGNOSIS — J302 Other seasonal allergic rhinitis: Secondary | ICD-10-CM | POA: Insufficient documentation

## 2023-12-22 DIAGNOSIS — E785 Hyperlipidemia, unspecified: Secondary | ICD-10-CM | POA: Insufficient documentation

## 2023-12-22 DIAGNOSIS — M25551 Pain in right hip: Secondary | ICD-10-CM | POA: Diagnosis not present

## 2023-12-26 ENCOUNTER — Other Ambulatory Visit (HOSPITAL_COMMUNITY): Payer: Self-pay

## 2023-12-27 ENCOUNTER — Other Ambulatory Visit: Payer: Self-pay | Admitting: Obstetrics and Gynecology

## 2023-12-27 DIAGNOSIS — J3089 Other allergic rhinitis: Secondary | ICD-10-CM | POA: Diagnosis not present

## 2023-12-27 DIAGNOSIS — R928 Other abnormal and inconclusive findings on diagnostic imaging of breast: Secondary | ICD-10-CM

## 2023-12-27 DIAGNOSIS — J301 Allergic rhinitis due to pollen: Secondary | ICD-10-CM | POA: Diagnosis not present

## 2023-12-27 DIAGNOSIS — J3081 Allergic rhinitis due to animal (cat) (dog) hair and dander: Secondary | ICD-10-CM | POA: Diagnosis not present

## 2023-12-28 ENCOUNTER — Encounter

## 2023-12-29 DIAGNOSIS — M25551 Pain in right hip: Secondary | ICD-10-CM | POA: Diagnosis not present

## 2023-12-31 ENCOUNTER — Ambulatory Visit: Admission: EM | Admit: 2023-12-31 | Discharge: 2023-12-31 | Disposition: A | Discharge status: Home / Self Care

## 2023-12-31 DIAGNOSIS — S7010XA Contusion of unspecified thigh, initial encounter: Secondary | ICD-10-CM

## 2023-12-31 DIAGNOSIS — S7011XA Contusion of right thigh, initial encounter: Secondary | ICD-10-CM

## 2023-12-31 MED ORDER — METHOCARBAMOL 500 MG PO TABS: 500.0000 mg | ORAL_TABLET | Freq: Every morning | ORAL | 0 refills | Status: DC

## 2023-12-31 MED ORDER — NAPROXEN 500 MG PO TABS: 500.0000 mg | ORAL_TABLET | Freq: Two times a day (BID) | ORAL | 0 refills | Status: DC

## 2023-12-31 MED ORDER — DEXAMETHASONE SODIUM PHOSPHATE 10 MG/ML IJ SOLN
10.0000 mg | Freq: Once | INTRAMUSCULAR | Status: AC
  Administered 2023-12-31: 10 mg via INTRAMUSCULAR

## 2023-12-31 MED ORDER — CYCLOBENZAPRINE HCL 10 MG PO TABS: 10.0000 mg | ORAL_TABLET | Freq: Every day | ORAL | 0 refills | Status: DC

## 2023-12-31 NOTE — Discharge Instructions (Addendum)
 You have a quadriceps contusion, which is a deep bruise to the large muscle in the front of your thigh. This happens when a direct hit or injury causes bleeding under the skin. Recovery usually takes a few days to a few weeks, depending on how severe the injury is. Take the medications I prescribed as directed. Stop taking Advil Dual Action and Baclofen for now--you can start them again after finishing the current medications. Avoid using any over-the-counter aspirin, Motrin, ibuprofen, or Aleve. Apply ice to the area several times a day, using a towel between the ice and your skin. Do not apply ice directly to your skin. Avoid using heat during the first 72 hours after your injury. Refrain from heavy lifting or pulling for the next week. If your symptoms last longer than three weeks, or if the bruising or swelling gets worse, contact your doctor.

## 2023-12-31 NOTE — ED Triage Notes (Signed)
"  I was moving some salon furniture and we didn't calculate right and the station got caught in my hip (right), I have noticed some bruising but the pain is terrible". DOI "12-30-2023".

## 2023-12-31 NOTE — ED Provider Notes (Signed)
 [facility]    CSN: [id number] Arrival date & time: 12/31/23  4742      History   Chief Complaint Chief Complaint  Patient presents with   Hip Pain    HPI Ashley [name] is a 48 y.o. female.     Ashley [name] is a 48 year old female presenting with pain in the anterior aspect of the right thigh following an injury that occurred one day ago. The patient reports that while moving a heavy salon booth-described as similar to a dresser-she was accidentally pinned against the wall by the furniture, causing direct trauma to the front of her right thigh. She developed bruising and localized muscle spasms in the area. Pain is minimal at rest but significantly worsens with walking or movement of the leg. She also notes difficulty sleeping due to discomfort. The pain radiates upward into the thigh but does not extend beyond. She denies dysuria or hematuria. The patient has a history of chronic back pain with nerve involvement and is status post lumbar fusion. She reports no change in her baseline paresthesias. Last night, she used Advil Dual Action and applied ice, with minimal relief. She has Percocet available but avoided using it due to its sedative effects. She also uses lidocaine patches and is on chronic Lyrica therapy.  The following portions of the patient's history were reviewed and updated as appropriate: allergies, current medications, past family history, past medical history, past social history, past surgical history, and problem list.       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   Complication of anesthesia    Depression    Depression    History of pneumonia    Hypertension    Infertility, female    Joint pain    Lumbar spondylosis    Osteoarthritis    PCOS (polycystic ovarian syndrome)    Pneumonia    2016   PONV (postoperative nausea and  vomiting)    Swelling of lower extremity     Patient Active Problem List   Diagnosis Date Noted   Hyperlipidemia 12/22/2023   Seasonal allergies 12/22/2023   Vitamin D deficiency 11/21/2023   Prediabetes 11/03/2023   Allergic rhinitis 01/29/2022   Allergic rhinitis due to animal (cat) (dog) hair and dander 01/29/2022   Allergic rhinitis due to pollen 01/29/2022   Chronic allergic conjunctivitis 01/29/2022   Seafood allergy 01/29/2022   History of endometrial ablation 07/24/2021   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   PCOS (polycystic ovarian syndrome) 04/02/2021   Depressive disorder 04/02/2021   Generalized OA 04/02/2021   Postoperative pain 04/02/2021   Acute blood loss anemia 04/02/2021   Neuropathic pain 04/02/2021   Therapeutic opioid induced constipation 04/02/2021   Urinary retention 04/02/2021   Fall 04/02/2021   Labile blood pressure 04/02/2021   Pain 04/02/2021   Generalized anxiety disorder 04/02/2021   Obesity 04/02/2021   Body mass index (BMI) 40.0-44.9, adult (HCC) 12/31/2020   Flat foot 05/07/2019   Abnormality of gait 02/22/2019   Herpes simplex 01/18/2019   Lumbosacral radiculopathy at L4 06/09/2018   Monoparesis of leg (HCC) 04/26/2018   Lumbar radiculopathy 02/21/2018   Status post surgery 02/17/2018   Hip pain 01/05/2018   Radiculopathy, lumbar region 08/10/2017   Lumbar spondylosis 08/10/2017   Herniated nucleus pulposus, lumbar 05/19/2016   Primary  localized osteoarthritis of right hip 04/21/2016   Sinusitis 06/25/2015   Cough 06/25/2015   Hypertensive disorder 04/03/2015   Dehydration 04/03/2015   CAP (community acquired pneumonia) 03/31/2015   Right lower lobe pneumonia 03/31/2015   Fever 03/31/2015   Pregnancy 04/29/2014   Miscarriage    Incomplete miscarriage 04/02/2011    Class: Acute   Deep venous thrombosis (HCC) 09/20/2010    Class: Chronic    Past Surgical History:  Procedure Laterality Date   ABDOMINAL EXPOSURE N/A  02/17/2018   Procedure: ABDOMINAL EXPOSURE;  Surgeon: Mayo Speck, MD;  Location: [facility] OR;  Service: Vascular;  Laterality: N/A;   ANKLE SURGERY     x 2   ANTERIOR LAT LUMBAR FUSION N/A 02/17/2018   Procedure: Lumbar three-four Lumbar four-five  Anterolateral lumbar interbody fusion;  Surgeon: Elna Haggis, MD;  Location: [facility] OR;  Service: Neurosurgery;  Laterality: N/A;   ANTERIOR LUMBAR FUSION N/A 02/17/2018   Procedure: Lumbar five-Sacral one Anterior lumbar interbody fusion;  Surgeon: Elna Haggis, MD;  Location: [facility];  Service: Neurosurgery;  Laterality: N/A;   APPLICATION OF ROBOTIC ASSISTANCE FOR SPINAL PROCEDURE N/A 02/17/2018   Procedure: APPLICATION OF ROBOTIC ASSISTANCE FOR SPINAL PROCEDURE;  Surgeon: Elna Haggis, MD;  Location: [facility] OR;  Service: Neurosurgery;  Laterality: N/A;   APPLICATION OF ROBOTIC ASSISTANCE FOR SPINAL PROCEDURE N/A 06/09/2018   Procedure: APPLICATION OF ROBOTIC ASSISTANCE FOR SPINAL PROCEDURE;  Surgeon: Elna Haggis, MD;  Location: [facility] OR;  Service: Neurosurgery;  Laterality: N/A;   BACK SURGERY     2017  discectomy   CARPAL TUNNEL RELEASE Left 07/04/2014   Procedure: LEFT CARPAL TUNNEL RELEASE;  Surgeon: Ferd Householder, MD;  Location: [facility];  Service: Orthopedics;  Laterality: Left;   CARPAL TUNNEL RELEASE Right 08/08/2014   Procedure: RIGHT CARPAL TUNNEL RELEASE;  Surgeon: Ferd Householder, MD;  Location: [facility];  Service: Orthopedics;  Laterality: Right;   DILATION AND CURETTAGE OF UTERUS     DILATION AND EVACUATION  04/02/2011   Procedure: DILATATION AND EVACUATION (D&E);  Surgeon: Denette Finner, MD;  Location: [facility];  Service: Gynecology;  Laterality: N/A;  dvt left mid thigh   DORSAL COMPARTMENT RELEASE Left 07/04/2014   Procedure: LEFT DEQUERVAINS;  Surgeon: Ferd Householder, MD;  Location: [facility];  Service: Orthopedics;  Laterality: Left;   ENDOSCOPIC PLANTAR FASCIOTOMY Left 02/01/2002   HARDWARE  REMOVAL Right 06/09/2018   Procedure: Repositioning of Right Lumbar three Right lumbar  four pedicle screw with MAZOR;  Surgeon: Elna Haggis, MD;  Location: [facility] OR;  Service: Neurosurgery;  Laterality: Right;   HYSTEROSCOPY WITH D & C  07/17/2010   with exc. endometrial polyps   KNEE ARTHROSCOPY Right 03/07/2003; 01/14/2005; 08/31/2006   KNEE ARTHROSCOPY Left 03/21/2003; 11/26/2004   LUMBAR PERCUTANEOUS PEDICLE SCREW 3 LEVEL N/A 02/17/2018   Procedure: LUMBAR PERCUTANEOUS PEDICLE SCREW PLACEMENT LUMBAR THREE-SACRAL ONE;  Surgeon: Elna Haggis, MD;  Location: [facility] OR;  Service: Neurosurgery;  Laterality: N/A;   SHOULDER ARTHROSCOPY Right    TOE SURGERY Left 01/10/2003   claw toe correction 2nd, 3rd, 4th toes   TOTAL HIP ARTHROPLASTY Right 04/21/2016   TOTAL HIP ARTHROPLASTY Right 04/21/2016   Procedure: TOTAL HIP ARTHROPLASTY ANTERIOR APPROACH;  Surgeon: Ferd Householder, MD;  Location: [facility] OR;  Service: Orthopedics;  Laterality: Right;   TOTAL KNEE ARTHROPLASTY Left 01/21/2010   TOTAL KNEE ARTHROPLASTY Right 08/26/2008   UPPER GI ENDOSCOPY N/A 04/14/2021  Procedure: UPPER GI ENDOSCOPY;  Surgeon: Jacolyn Matar, MD;  Location: [facility];  Service: General;  Laterality: N/A;    OB History     Gravida  4   Para  2   Term  1   Preterm  1   AB  2   Living  2      SAB  1   IAB  1   Ectopic      Multiple      Live Births  2            Home Medications    Prior to Admission medications   Medication Sig Start Date End Date Taking? Authorizing Provider  Boric Acid 600 MG SUPP 1 capsule per vagina at bedtime x 2 weeks 08/15/23  Yes [provider]  BORIC ACID VA INSERT 1 capsule per vagina at bedtime x 2 weeks 08/15/23  Yes [provider]  cyclobenzaprine (FLEXERIL) 10 MG tablet Take 1 tablet (10 mg total) by mouth at bedtime. 12/31/23  Yes Maryruth Sol, FNP  methocarbamol (ROBAXIN) 500 MG tablet Take 1 tablet (500 mg total) by mouth every morning. 12/31/23  Yes  Schylar Allard, FNP  naproxen (NAPROSYN) 500 MG tablet Take 1 tablet (500 mg total) by mouth 2 (two) times daily with a meal. 12/31/23  Yes Maryruth Sol, FNP  Albuterol Sulfate (PROAIR RESPICLICK) 108 (90 Base) MCG/ACT AEPB Inhale 1 puff into the lungs every 4 (four) hours as needed. 01/10/22   Crain, Whitney L, PA  amLODipine (NORVASC) 5 MG tablet  05/22/20   [provider]  Boric Acid 600 MG SUPP INSERT 1 capsule per vagina at bedtime x 2 weeks    [provider]  buPROPion (WELLBUTRIN XL) 300 MG 24 hr tablet Take 300 mg by mouth daily.    [provider]  cetirizine (ZYRTEC) 10 MG tablet Take 10 mg by mouth daily as needed for allergies.    [provider]  docusate sodium (COLACE) 100 MG capsule     [provider]  EPINEPHrine 0.3 mg/0.3 mL IJ SOAJ injection Inject 3 mLs into the muscle once. 05/26/18   [provider]  fluticasone (FLONASE) 50 MCG/ACT nasal spray Place 1 spray into both nostrils daily.    [provider]  furosemide (LASIX) 20 MG tablet Take 20 mg by mouth daily as needed for edema. 02/18/21   [provider]  glycopyrrolate (ROBINUL) 1 MG tablet Take 1 mg by mouth 3 (three) times daily.    [provider]  levocetirizine (XYZAL ALLERGY 24HR) 5 MG tablet     [provider]  lidocaine (LIDODERM) 5 % Place 1 patch onto the skin 2 (two) times daily. 02/25/21   [provider]  lisdexamfetamine (VYVANSE) 50 MG capsule Take 50 mg by mouth daily.    [provider]  losartan-hydrochlorothiazide (HYZAAR) 100-12.5 MG tablet Take 1 tablet by mouth daily. 01/29/21   [provider]  Multiple Vitamin (MULTIVITAMIN) tablet Take 1 tablet by mouth daily. Bariatric vitamins    [provider]  oxyCODONE-acetaminophen (PERCOCET) 10-325 MG tablet Take 1 tablet by mouth every six hours as directed 12/05/2023 12/05/23     oxyCODONE-acetaminophen (PERCOCET) 10-325 MG tablet  Take 1 tablet by mouth every 6 (six) hours as directed. 11/07/23     pregabalin (LYRICA) 75 MG capsule Take 75 mg by mouth 3 (three) times daily.    [provider]  pregabalin (LYRICA) 75 MG capsule Take 1 capsule (  75 mg total) by mouth 3 (three) times daily. 11/07/23     rosuvastatin (CRESTOR) 10 MG tablet Take 1 tablet (10 mg total) by mouth daily. 05/16/23 10/12/23  Olinda Bertrand, DO    Family History Family History  Problem Relation Age of Onset   Diabetes Father    Cancer Father        COLON   Heart disease Father    Kidney disease Father        dialysis   High blood pressure Father    Heart disease Sister    Diabetes Sister    Heart failure Sister    Heart failure Sister    Heart disease Sister    Cancer Maternal Grandmother        UTERINE   Cancer Paternal Grandmother        breast   Breast cancer Paternal Grandmother    Breast cancer Cousin     Social History Social History   Tobacco Use   Smoking status: Never   Smokeless tobacco: Never  Vaping Use   Vaping status: Never Used  Substance Use Topics   Alcohol use: No    Alcohol/week: 0.0 standard drinks of alcohol   Drug use: No     Allergies   Gadolinium derivatives, Codeine, Duloxetine, and Duloxetine hcl   Review of Systems Review of Systems  Genitourinary:  Negative for dysuria and hematuria.  Musculoskeletal:  Positive for back pain, gait problem and myalgias. Negative for arthralgias and joint swelling.  Neurological:  Negative for weakness and numbness.  All other systems reviewed and are negative.    Physical Exam Triage Vital Signs ED Triage Vitals  Encounter Vitals Group     BP 12/31/23 0848 123/81     Systolic BP Percentile --      Diastolic BP Percentile --      Pulse Rate 12/31/23 0848 90     Resp 12/31/23 0848 18     Temp 12/31/23 0848 98.1 F (36.7 C)     Temp Source 12/31/23 0848 Oral     SpO2 12/31/23 0848 98 %     Weight 12/31/23 0845 252 lb (114.3 kg)     Height  12/31/23 0845 5\' 11"  (1.803 m)     Head Circumference --      Peak Flow --      Pain Score 12/31/23 0843 10     Pain Loc --      Pain Education --      Exclude from Growth Chart --    No data found.  Updated Vital Signs BP 123/81 (BP Location: Right Arm)   Pulse 90   Temp 98.1 F (36.7 C) (Oral)   Resp 18   Ht 5\' 11"  (1.803 m)   Wt 252 lb (114.3 kg)   SpO2 98%   BMI 35.15 kg/m   Visual Acuity Right Eye Distance:   Left Eye Distance:   Bilateral Distance:    Right Eye Near:   Left Eye Near:    Bilateral Near:     Physical Exam Vitals reviewed.  Constitutional:      Appearance: Normal appearance. She is normal weight.  HENT:     Head: Normocephalic.  Cardiovascular:     Rate and Rhythm: Normal rate.  Pulmonary:     Effort: Pulmonary effort is normal.  Musculoskeletal:        General: Normal range of motion.     Cervical back: Normal range of motion and neck supple.  Right upper leg: Swelling and tenderness present. No deformity, lacerations or bony tenderness.     Comments: Mild puffiness with bruising noted to the anterior right thigh at the site of impact. Area is tender to palpation. No abrasions, lacerations, or open skin lesions observed.  Skin:    General: Skin is warm and dry.     Findings: Bruising and signs of injury present.     Comments: Anterior right thigh   Neurological:     General: No focal deficit present.     Mental Status: She is alert and oriented to person, place, and time.     Cranial Nerves: No cranial nerve deficit.     Sensory: Sensation is intact. No sensory deficit.     Motor: Motor function is intact. No weakness.     Coordination: Coordination is intact.     Gait: Gait is intact.     Comments: Gait is slow and appears uncomfortable due to right thigh pain, but no specific abnormalities or instability noted      UC Treatments / Results  Labs (all labs ordered are listed, but only abnormal results are displayed) Labs  Reviewed - No data to display  EKG   Radiology No results found.  Procedures Procedures (including critical care time)  Medications Ordered in UC Medications  dexamethasone (DECADRON) injection 10 mg (10 mg Intramuscular Given 12/31/23 0928)    Initial Impression / Assessment and Plan / UC Course  I have reviewed the triage vital signs and the nursing notes.  Pertinent labs & imaging results that were available during my care of the patient were reviewed by me and considered in my medical decision making (see chart for details).    48 yo female with history of chronic back pain with nerve involvement status post lumbar fusion presents with right anterior thigh pain and muscle spasms following blunt trauma from a heavy piece of furniture. Exam notable for bruising at the site of impact, intact sensation, and full range of motion with pain. No focal neurological deficits identified. Decadron administered in clinic. Prescriptions provided for Robaxin in the morning, Flexeril at bedtime, and Naproxen twice daily. Advised to discontinue baclofen, Advil Dual Action and avoid all over-the-counter NSAIDs or aspirin. Patient may continue use of Percocet, Lyrica, and lidocaine patches as previously prescribed. Recommended to alternate ice and heat for symptom relief. Follow-up with PCP as needed for persistent or worsening symptoms.  Today's evaluation has revealed no signs of a dangerous process. Discussed diagnosis with patient and/or guardian. Patient and/or guardian aware of their diagnosis, possible red flag symptoms to watch out for and need for close follow up. Patient and/or guardian understands verbal and written discharge instructions. Patient and/or guardian comfortable with plan and disposition.  Patient and/or guardian has a clear mental status at this time, good insight into illness (after discussion and teaching) and has clear judgment to make decisions regarding their  care  Documentation was completed with the aid of voice recognition software. Transcription may contain typographical errors. Final Clinical Impressions(s) / UC Diagnoses   Final diagnoses:  Contusion of anterior thigh, right, initial encounter  Contusion of quadriceps     Discharge Instructions      You have a quadriceps contusion, which is a deep bruise to the large muscle in the front of your thigh. This happens when a direct hit or injury causes bleeding under the skin. Recovery usually takes a few days to a few weeks, depending on how severe the injury  is. Take the medications I prescribed as directed. Stop taking Advil Dual Action and Baclofen for now--you can start them again after finishing the current medications. Avoid using any over-the-counter aspirin, Motrin, ibuprofen, or Aleve. Apply ice to the area several times a day, using a towel between the ice and your skin. Do not apply ice directly to your skin. Avoid using heat during the first 72 hours after your injury. Refrain from heavy lifting or pulling for the next week. If your symptoms last longer than three weeks, or if the bruising or swelling gets worse, contact your doctor.       ED Prescriptions     Medication Sig Dispense Auth. Provider   naproxen (NAPROSYN) 500 MG tablet Take 1 tablet (500 mg total) by mouth 2 (two) times daily with a meal. 20 tablet Maryruth Sol, FNP   methocarbamol (ROBAXIN) 500 MG tablet Take 1 tablet (500 mg total) by mouth every morning. 10 tablet Maryruth Sol, FNP   cyclobenzaprine (FLEXERIL) 10 MG tablet Take 1 tablet (10 mg total) by mouth at bedtime. 10 tablet Maryruth Sol, FNP      PDMP not reviewed this encounter.   Maryruth Sol, [place] 12/31/23 [phone]

## 2024-01-02 ENCOUNTER — Other Ambulatory Visit (HOSPITAL_COMMUNITY): Payer: Self-pay

## 2024-01-02 DIAGNOSIS — G894 Chronic pain syndrome: Secondary | ICD-10-CM | POA: Diagnosis not present

## 2024-01-02 DIAGNOSIS — G4700 Insomnia, unspecified: Secondary | ICD-10-CM | POA: Diagnosis not present

## 2024-01-02 DIAGNOSIS — M47817 Spondylosis without myelopathy or radiculopathy, lumbosacral region: Secondary | ICD-10-CM | POA: Diagnosis not present

## 2024-01-02 MED ORDER — PREGABALIN 75 MG PO CAPS
75.0000 mg | ORAL_CAPSULE | Freq: Three times a day (TID) | ORAL | 0 refills | Status: DC
  Filled 2024-01-02: qty 270, 90d supply, fill #0

## 2024-01-02 MED ORDER — BACLOFEN 10 MG PO TABS
10.0000 mg | ORAL_TABLET | Freq: Two times a day (BID) | ORAL | 0 refills | Status: DC
  Filled 2024-01-02: qty 180, 90d supply, fill #0

## 2024-01-02 MED ORDER — OXYCODONE-ACETAMINOPHEN 10-325 MG PO TABS
1.0000 | ORAL_TABLET | Freq: Four times a day (QID) | ORAL | 0 refills | Status: DC
Start: 2024-01-30 — End: 2024-04-18
  Filled 2024-02-12: qty 120, 30d supply, fill #0

## 2024-01-02 MED ORDER — LIDOCAINE 5 % EX PTCH
3.0000 | MEDICATED_PATCH | CUTANEOUS | 0 refills | Status: DC
  Filled 2024-01-02 – 2024-01-13 (×2): qty 90, 30d supply, fill #0

## 2024-01-02 MED ORDER — OXYCODONE-ACETAMINOPHEN 10-325 MG PO TABS
1.0000 | ORAL_TABLET | Freq: Four times a day (QID) | ORAL | 0 refills | Status: DC
  Filled 2024-01-02 – 2024-01-13 (×2): qty 120, 30d supply, fill #0

## 2024-01-04 DIAGNOSIS — J3081 Allergic rhinitis due to animal (cat) (dog) hair and dander: Secondary | ICD-10-CM | POA: Diagnosis not present

## 2024-01-04 DIAGNOSIS — J301 Allergic rhinitis due to pollen: Secondary | ICD-10-CM | POA: Diagnosis not present

## 2024-01-04 DIAGNOSIS — J3089 Other allergic rhinitis: Secondary | ICD-10-CM | POA: Diagnosis not present

## 2024-01-05 ENCOUNTER — Encounter

## 2024-01-09 ENCOUNTER — Encounter

## 2024-01-09 ENCOUNTER — Encounter: Payer: Self-pay | Admitting: Family Medicine

## 2024-01-09 ENCOUNTER — Ambulatory Visit (INDEPENDENT_AMBULATORY_CARE_PROVIDER_SITE_OTHER): Admitting: Family Medicine

## 2024-01-09 VITALS — BP 123/74 | HR 80 | Temp 99.0°F | Ht 71.5 in | Wt 257.0 lb

## 2024-01-09 DIAGNOSIS — R600 Localized edema: Secondary | ICD-10-CM | POA: Insufficient documentation

## 2024-01-09 DIAGNOSIS — Z6835 Body mass index (BMI) 35.0-35.9, adult: Secondary | ICD-10-CM | POA: Diagnosis not present

## 2024-01-09 DIAGNOSIS — G894 Chronic pain syndrome: Secondary | ICD-10-CM | POA: Insufficient documentation

## 2024-01-09 DIAGNOSIS — Z903 Acquired absence of stomach [part of]: Secondary | ICD-10-CM

## 2024-01-09 DIAGNOSIS — E66812 Obesity, class 2: Secondary | ICD-10-CM | POA: Diagnosis not present

## 2024-01-09 DIAGNOSIS — R7303 Prediabetes: Secondary | ICD-10-CM | POA: Diagnosis not present

## 2024-01-09 NOTE — Progress Notes (Signed)
 Office: [phone]  /  Fax: [phone]  WEIGHT SUMMARY AND BIOMETRICS  Starting Date: 11/03/23  Starting Weight: 254 lb   Weight Lost Since Last Visit: 0   Vitals Temp: 99 F (37.2 C) BP: 123/74 Pulse Rate: 80 SpO2: 99 %   Body Composition  Body Fat %: 46.5 % Fat Mass (lbs): 119.6 lbs Muscle Mass (lbs): 130.8 lbs Total Body Water (lbs): 107.8 lbs Visceral Fat Rating : 12     HPI  Chief Complaint: OBESITY  Ashley [name] is here to discuss her progress with her obesity treatment plan. She is on the keeping a food journal and adhering to recommended goals of 1800 calories and 110 grams of protein and states she is following her eating plan approximately 25% of the time. She states she is trying to exercise when feels able to it and has walked at least 5-7,000 steps.   Interval History:  Since last office visit she is up 7 lb This gives her a net weight gain of 3 lb in 2 mos of medically supervised weight management She feels less restriction from her vertical sleeve gastrectomy, often eating more between meals and snacking more Chronic pain and depressed mood barriers to progress She is from her previous nadir weight of 238 pounds having more joint pain now She complains of bilateral leg edema, intermittent Has been doing more emotional eating and eating out Sleep is still lacking She had a visit to the ED on 4/12 for right hip pain She has follow-up with orthopedics  Pharmacotherapy: None Insurance does not cover antiobesity medication  PHYSICAL EXAM:  Blood pressure 123/74, pulse 80, temperature 99 F (37.2 C), height 5' 11.5" (1.816 m), weight 257 lb (116.6 kg), SpO2 99%. Body mass index is 35.34 kg/m.  General: She is overweight, cooperative, alert, well developed, and in no acute distress. PSYCH: Has normal mood, affect and thought process.   Lungs: Normal breathing effort, no conversational dyspnea. Bilateral leg edema notable at both  malleolus  ASSESSMENT AND PLAN  TREATMENT PLAN FOR OBESITY:  Recommended Dietary Goals  Ashley [name] is currently in the action stage of change. As such, her goal is to continue weight management plan. She has agreed to keeping a food journal and adhering to recommended goals of 1600 calories and 110 to 130 g of protein and following a lower carbohydrate, vegetable and lean protein rich diet plan. Work on Media planner Intervention  We discussed the following Behavioral Modification Strategies today: increasing lean protein intake to established goals, increasing fiber rich foods, increasing water intake , work on meal planning and preparation, work on Counselling psychologist calories using tracking application, keeping healthy foods at home, work on managing stress, creating time for self-care and relaxation, avoiding temptations and identifying enticing environmental cues, continue to work on implementation of reduced calorie nutritional plan, planning for success, and continue to work on maintaining a reduced calorie state, getting the recommended amount of protein, incorporating whole foods, making healthy choices, staying well hydrated and practicing mindfulness when eating..  Additional resources provided today: NA  Recommended Physical Activity Goals  Ashley [name] has been advised to work up to 150 minutes of moderate intensity aerobic activity a week and strengthening exercises 2-3 times per week for cardiovascular health, weight loss maintenance and preservation of muscle mass.   She has agreed to Think about enjoyable ways to increase daily physical activity and overcoming barriers to exercise and Increase physical activity in their day and reduce sedentary time (  increase NEAT).  Pharmacotherapy changes for the treatment of obesity: None  ASSOCIATED CONDITIONS ADDRESSED TODAY  Chronic pain syndrome Unchanged Chronic pain has been a barrier to her ability to ramp up  exercise.  She has a history of lumbar radiculopathy more recently having issues with right hip pain after trauma.  She is scheduled to see her orthopedic surgeon back.  She has a prescription for Flexeril  10 mg to use at bedtime as needed, oxycodone /acetaminophen , as needed and Lyrica  75 mg 3 times daily.  Continue all medication as prescribed.  Schedule follow-up with orthopedics.  Plan to add on water exercise over the next month.  She is agreeable.  Class 2 obesity due to excess calories with body mass index (BMI) of 35.0 to 35.9 in adult, unspecified whether serious comorbidity present Reviewed patient's overall progress.  She is patient disappointed with her net weight gain of 3 pounds in the past 2 months medically supervised weight management will ask insurance coverage for GLP-1 receptor agonist.  She is not a good candidate for use of phentermine  currently on Vyvanse and has hypertension.  She has room for improvement with consistent dietary tracking and increasing walking time.  Status post sleeve gastrectomy Continue a bariatric multivitamin once daily.  Annual bariatric labs are reviewed and up-to-date.  Recommend prioritizing lean protein at mealtime, eating on a schedule, hydrating well with water between meals.  Keep high sugar foods and drinks out of the house.  Track dietary protein intake.  Prediabetes Lab Results  Component Value Date   HGBA1C 5.5 11/03/2023  Improved.  Continue to limit intake of high sugar foods and drinks with a history of prediabetes  Bilateral leg edema Worsening.  Patient notes sporadic episodes of bilateral leg edema currently worse.  She has a prescription for Lasix  but has not been using due to 1 episode of muscle cramps.  She has eaten out a few more times than normal.  We discussed high sodium intake of restaurant food and recommend reducing frequency of restaurant foods as well as other high sodium food items.  Recommend hydrating well with water,  short walks throughout the day and she may use a bottle of propel and one half of banana if she needs to take her Lasix  as needed.     She was informed of the importance of frequent follow up visits to maximize her success with intensive lifestyle modifications for her multiple health conditions.   ATTESTASTION STATEMENTS:  Reviewed by clinician on day of visit: allergies, medications, problem list, medical history, surgical history, family history, social history, and previous encounter notes pertinent to obesity diagnosis.   I have personally spent 30 minutes total time today in preparation, patient care, nutritional counseling and education,  and documentation for this visit, including the following: review of most recent clinical lab tests, prescribing medications/ refilling medications, reviewing medical assistant documentation, review and interpretation of bioimpedence results.     Micky Albee, D.O. DABFM, DABOM [facility] [address] [place], [place] [address] [phone]

## 2024-01-09 NOTE — Patient Instructions (Signed)
 Work on tracking daily intake on the [employer]itness[employer] ap Aim for 1600 cal/ day This should include 110-130 g of protein daily  Avoid high sugar foods and drinks F/u with your PCP and ortho for joint pain/ leg swelling  Limit meals out and other high sodium foods Hydrate well with water between meals  Short walks as tolerated  Propel + 1/2 a banana if you need to take Lasix  for swelling

## 2024-01-11 DIAGNOSIS — J3081 Allergic rhinitis due to animal (cat) (dog) hair and dander: Secondary | ICD-10-CM | POA: Diagnosis not present

## 2024-01-11 DIAGNOSIS — J3089 Other allergic rhinitis: Secondary | ICD-10-CM | POA: Diagnosis not present

## 2024-01-11 DIAGNOSIS — J301 Allergic rhinitis due to pollen: Secondary | ICD-10-CM | POA: Diagnosis not present

## 2024-01-12 ENCOUNTER — Other Ambulatory Visit (HOSPITAL_COMMUNITY): Payer: Self-pay

## 2024-01-13 ENCOUNTER — Other Ambulatory Visit (HOSPITAL_COMMUNITY): Payer: Self-pay

## 2024-01-18 DIAGNOSIS — J3081 Allergic rhinitis due to animal (cat) (dog) hair and dander: Secondary | ICD-10-CM | POA: Diagnosis not present

## 2024-01-18 DIAGNOSIS — J301 Allergic rhinitis due to pollen: Secondary | ICD-10-CM | POA: Diagnosis not present

## 2024-01-18 DIAGNOSIS — J3089 Other allergic rhinitis: Secondary | ICD-10-CM | POA: Diagnosis not present

## 2024-01-19 ENCOUNTER — Encounter

## 2024-01-19 DIAGNOSIS — M7611 Psoas tendinitis, right hip: Secondary | ICD-10-CM | POA: Diagnosis not present

## 2024-01-19 DIAGNOSIS — M25551 Pain in right hip: Secondary | ICD-10-CM | POA: Diagnosis not present

## 2024-01-20 DIAGNOSIS — M62838 Other muscle spasm: Secondary | ICD-10-CM | POA: Diagnosis not present

## 2024-01-20 DIAGNOSIS — L74510 Primary focal hyperhidrosis, axilla: Secondary | ICD-10-CM | POA: Diagnosis not present

## 2024-01-20 DIAGNOSIS — G4700 Insomnia, unspecified: Secondary | ICD-10-CM | POA: Diagnosis not present

## 2024-01-20 DIAGNOSIS — R519 Headache, unspecified: Secondary | ICD-10-CM | POA: Diagnosis not present

## 2024-01-20 DIAGNOSIS — Z96649 Presence of unspecified artificial hip joint: Secondary | ICD-10-CM | POA: Diagnosis not present

## 2024-01-20 DIAGNOSIS — I10 Essential (primary) hypertension: Secondary | ICD-10-CM | POA: Diagnosis not present

## 2024-01-24 DIAGNOSIS — J3081 Allergic rhinitis due to animal (cat) (dog) hair and dander: Secondary | ICD-10-CM | POA: Diagnosis not present

## 2024-01-24 DIAGNOSIS — J3089 Other allergic rhinitis: Secondary | ICD-10-CM | POA: Diagnosis not present

## 2024-01-24 DIAGNOSIS — J301 Allergic rhinitis due to pollen: Secondary | ICD-10-CM | POA: Diagnosis not present

## 2024-01-25 ENCOUNTER — Ambulatory Visit
Admission: RE | Admit: 2024-01-25 | Discharge: 2024-01-25 | Disposition: A | Discharge status: Home / Self Care | Source: Ambulatory Visit | Attending: Obstetrics and Gynecology | Admitting: Obstetrics and Gynecology

## 2024-01-25 DIAGNOSIS — R928 Other abnormal and inconclusive findings on diagnostic imaging of breast: Secondary | ICD-10-CM

## 2024-01-25 DIAGNOSIS — R921 Mammographic calcification found on diagnostic imaging of breast: Secondary | ICD-10-CM | POA: Diagnosis not present

## 2024-01-26 ENCOUNTER — Other Ambulatory Visit: Payer: Self-pay | Admitting: Obstetrics and Gynecology

## 2024-01-26 DIAGNOSIS — R921 Mammographic calcification found on diagnostic imaging of breast: Secondary | ICD-10-CM

## 2024-02-03 DIAGNOSIS — M47816 Spondylosis without myelopathy or radiculopathy, lumbar region: Secondary | ICD-10-CM | POA: Diagnosis not present

## 2024-02-07 DIAGNOSIS — J3089 Other allergic rhinitis: Secondary | ICD-10-CM | POA: Diagnosis not present

## 2024-02-07 DIAGNOSIS — J3081 Allergic rhinitis due to animal (cat) (dog) hair and dander: Secondary | ICD-10-CM | POA: Diagnosis not present

## 2024-02-07 DIAGNOSIS — J301 Allergic rhinitis due to pollen: Secondary | ICD-10-CM | POA: Diagnosis not present

## 2024-02-09 ENCOUNTER — Telehealth: Payer: Self-pay | Admitting: *Deleted

## 2024-02-09 DIAGNOSIS — M7611 Psoas tendinitis, right hip: Secondary | ICD-10-CM | POA: Diagnosis not present

## 2024-02-09 NOTE — Telephone Encounter (Signed)
 Pt has been scheduled a tele visit, 02/27/24 9:20.  Consent on file / medications reconciled.     Patient Consent for Virtual Visit        Ashley [name] has provided verbal consent on 02/09/2024 for a virtual visit (video or telephone).   CONSENT FOR VIRTUAL VISIT FOR:  Ashley [name]  By participating in this virtual visit I agree to the following:  I hereby voluntarily request, consent and authorize [facility] and its employed or contracted physicians, physician assistants, nurse practitioners or other licensed health care professionals (the Practitioner), to provide me with telemedicine health care services (the "Services") as deemed necessary by the treating Practitioner. I acknowledge and consent to receive the Services by the Practitioner via telemedicine. I understand that the telemedicine visit will involve communicating with the Practitioner through live audiovisual communication technology and the disclosure of certain medical information by electronic transmission. I acknowledge that I have been given the opportunity to request an in-person assessment or other available alternative prior to the telemedicine visit and am voluntarily participating in the telemedicine visit.  I understand that I have the right to withhold or withdraw my consent to the use of telemedicine in the course of my care at any time, without affecting my right to future care or treatment, and that the Practitioner or I may terminate the telemedicine visit at any time. I understand that I have the right to inspect all information obtained and/or recorded in the course of the telemedicine visit and may receive copies of available information for a reasonable fee.  I understand that some of the potential risks of receiving the Services via telemedicine include:  Delay or interruption in medical evaluation due to technological equipment failure or disruption; Information transmitted may not be sufficient  (e.g. poor resolution of images) to allow for appropriate medical decision making by the Practitioner; and/or  In rare instances, security protocols could fail, causing a breach of personal health information.  Furthermore, I acknowledge that it is my responsibility to provide information about my medical history, conditions and care that is complete and accurate to the best of my ability. I acknowledge that Practitioner's advice, recommendations, and/or decision may be based on factors not within their control, such as incomplete or inaccurate data provided by me or distortions of diagnostic images or specimens that may result from electronic transmissions. I understand that the practice of medicine is not an exact science and that Practitioner makes no warranties or guarantees regarding treatment outcomes. I acknowledge that a copy of this consent can be made available to me via my patient portal [facility] MyChart), or I can request a printed copy by calling the office of [facility].    I understand that my insurance will be billed for this visit.   I have read or had this consent read to me. I understand the contents of this consent, which adequately explains the benefits and risks of the Services being provided via telemedicine.  I have been provided ample opportunity to ask questions regarding this consent and the Services and have had my questions answered to my satisfaction. I give my informed consent for the services to be provided through the use of telemedicine in my medical care

## 2024-02-09 NOTE — Telephone Encounter (Signed)
   Pre-operative Risk Assessment    Patient Name: Ashley [name]  DOB: November 20, 1975 MRN: [id number]   Date of last office visit: 10/12/23 Date of next office visit: N/A   Request for Surgical Clearance    Procedure:  REVISION RIGHT TOTAL HIP ARTHROPLASTY  Date of Surgery:  Clearance 04/02/24                                Surgeon:  Priscille Brought, MD Surgeon's Group or Practice Name:  [facility] Phone number:  [phone] Fax number:  [phone]   Type of Clearance Requested:   - Medical    Type of Anesthesia:  Spinal   Additional requests/questions:    SignedMaeola Schmidt   02/09/2024, 9:25 AM

## 2024-02-09 NOTE — Telephone Encounter (Signed)
 Pt has been scheduled a tele visit, 02/27/24 9:20.  Consent on file / medications reconciled.

## 2024-02-09 NOTE — Telephone Encounter (Signed)
   Name: Ashley [name]  DOB: [birth year]  MRN: [id number]  Primary Cardiologist: Olinda Bertrand, DO   Preoperative team, please contact this patient and set up a phone call appointment for further preoperative risk assessment. Please obtain consent and complete medication review. Thank you for your help.  - Please schedule clearance visit closer to date of procedure which is 04/02/2024.  I confirm that guidance regarding antiplatelet and oral anticoagulation therapy has been completed and, if necessary, noted below.    I also confirmed the patient resides in the state of [place] . As per [facility] Medical Board telemedicine laws, the patient must reside in the state in which the provider is licensed.   Francene Ing, Retha Cast, NP 02/09/2024, 9:32 AM [facility]

## 2024-02-10 ENCOUNTER — Ambulatory Visit
Admission: EM | Admit: 2024-02-10 | Discharge: 2024-02-10 | Disposition: A | Discharge status: Home / Self Care | Attending: Family Medicine | Admitting: Family Medicine

## 2024-02-10 DIAGNOSIS — S61211A Laceration without foreign body of left index finger without damage to nail, initial encounter: Secondary | ICD-10-CM | POA: Diagnosis not present

## 2024-02-10 MED ORDER — TETANUS-DIPHTH-ACELL PERTUSSIS 5-2.5-18.5 LF-MCG/0.5 IM SUSY
0.5000 mL | PREFILLED_SYRINGE | Freq: Once | INTRAMUSCULAR | Status: AC
  Administered 2024-02-10: 0.5 mL via INTRAMUSCULAR

## 2024-02-10 NOTE — Discharge Instructions (Signed)
 You have been given a Tdap vaccination to boost your tetanus immunity  Once daily wash the cut with soapy water and then put new triple antibiotic ointment on it.  You can wear the finger splint to keep you from bending your finger very much, while the cut heals.

## 2024-02-10 NOTE — ED Provider Notes (Signed)
 [facility]    CSN: [id number] Arrival date & time: 02/10/24  0905      History   Chief Complaint Chief Complaint  Patient presents with   Laceration    HPI Ashley [name] is a 48 y.o. female.    Laceration Here for a cut on her left index finger.  She sustained this laceration this morning when she was opening a package of knives with some hard plastic packaging.  She thinks the plastic packaging actually cut her finger.  She has trouble taking duloxetine, and codeine and gadolinium.  She has had an ablation so does not have periods anymore.  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   Complication of anesthesia    Depression    Depression    History of pneumonia    Hypertension    Infertility, female    Joint pain    Lumbar spondylosis    Osteoarthritis    PCOS (polycystic ovarian syndrome)    Pneumonia    2016   PONV (postoperative nausea and vomiting)    Swelling of lower extremity     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   PCOS (polycystic ovarian syndrome) 04/02/2021   Depressive disorder 04/02/2021   Generalized OA 04/02/2021   Postoperative pain 04/02/2021   Acute blood loss anemia 04/02/2021   Neuropathic pain 04/02/2021   Therapeutic opioid induced constipation 04/02/2021   Urinary retention  04/02/2021   Fall 04/02/2021   Labile blood pressure 04/02/2021   Pain 04/02/2021   Generalized anxiety disorder 04/02/2021   Obesity 04/02/2021   Body mass index (BMI) 40.0-44.9, adult (HCC) 12/31/2020   Flat foot 05/07/2019   Abnormality of gait 02/22/2019   Herpes simplex 01/18/2019   Lumbosacral radiculopathy at L4 06/09/2018   Paresis of single lower extremity (HCC) 04/26/2018   Lumbar radiculopathy 02/21/2018   Status post surgery 02/17/2018   Hip pain 01/05/2018   Radiculopathy, lumbar region 08/10/2017   Lumbar spondylosis 08/10/2017   Herniated nucleus pulposus, lumbar 05/19/2016   Primary localized osteoarthritis of right hip 04/21/2016   Sinusitis 06/25/2015   Other specified cough 06/25/2015   Hypertensive disorder 04/03/2015   Dehydration 04/03/2015   CAP (community acquired pneumonia) 03/31/2015   Right lower lobe pneumonia 03/31/2015   Fever 03/31/2015   Pregnancy 04/29/2014   Miscarriage    Incomplete miscarriage 04/02/2011    Class: Acute   Deep venous thrombosis (HCC) 09/20/2010    Class: Chronic    Past Surgical History:  Procedure Laterality Date   ABDOMINAL EXPOSURE N/A 02/17/2018   Procedure: ABDOMINAL EXPOSURE;  Surgeon: Mayo Speck, MD;  Location: [facility] OR;  Service: Vascular;  Laterality: N/A;   ANKLE SURGERY     x 2   ANTERIOR LAT LUMBAR FUSION N/A 02/17/2018   Procedure: Lumbar three-four  Lumbar four-five  Anterolateral lumbar interbody fusion;  Surgeon: Elna Haggis, MD;  Location: [facility] OR;  Service: Neurosurgery;  Laterality: N/A;   ANTERIOR LUMBAR FUSION N/A 02/17/2018   Procedure: Lumbar five-Sacral one Anterior lumbar interbody fusion;  Surgeon: Elna Haggis, MD;  Location: [facility] OR;  Service: Neurosurgery;  Laterality: N/A;   APPLICATION OF ROBOTIC ASSISTANCE FOR SPINAL PROCEDURE N/A 02/17/2018   Procedure: APPLICATION OF ROBOTIC ASSISTANCE FOR SPINAL PROCEDURE;  Surgeon: Elna Haggis, MD;  Location: [facility] OR;  Service: Neurosurgery;  Laterality:  N/A;   APPLICATION OF ROBOTIC ASSISTANCE FOR SPINAL PROCEDURE N/A 06/09/2018   Procedure: APPLICATION OF ROBOTIC ASSISTANCE FOR SPINAL PROCEDURE;  Surgeon: Elna Haggis, MD;  Location: [facility] OR;  Service: Neurosurgery;  Laterality: N/A;   BACK SURGERY     2017  discectomy   CARPAL TUNNEL RELEASE Left 07/04/2014   Procedure: LEFT CARPAL TUNNEL RELEASE;  Surgeon: Ferd Householder, MD;  Location: [facility];  Service: Orthopedics;  Laterality: Left;   CARPAL TUNNEL RELEASE Right 08/08/2014   Procedure: RIGHT CARPAL TUNNEL RELEASE;  Surgeon: Ferd Householder, MD;  Location: [facility];  Service: Orthopedics;  Laterality: Right;   DILATION AND CURETTAGE OF UTERUS     DILATION AND EVACUATION  04/02/2011   Procedure: DILATATION AND EVACUATION (D&E);  Surgeon: Denette Finner, MD;  Location: [facility]S;  Service: Gynecology;  Laterality: N/A;  dvt left mid thigh   DORSAL COMPARTMENT RELEASE Left 07/04/2014   Procedure: LEFT DEQUERVAINS;  Surgeon: Ferd Householder, MD;  Location: [facility];  Service: Orthopedics;  Laterality: Left;   ENDOSCOPIC PLANTAR FASCIOTOMY Left 02/01/2002   HARDWARE REMOVAL Right 06/09/2018   Procedure: Repositioning of Right Lumbar three Right lumbar  four pedicle screw with MAZOR;  Surgeon: Elna Haggis, MD;  Location: [facility] OR;  Service: Neurosurgery;  Laterality: Right;   HYSTEROSCOPY WITH D & C  07/17/2010   with exc. endometrial polyps   KNEE ARTHROSCOPY Right 03/07/2003; 01/14/2005; 08/31/2006   KNEE ARTHROSCOPY Left 03/21/2003; 11/26/2004   LUMBAR PERCUTANEOUS PEDICLE SCREW 3 LEVEL N/A 02/17/2018   Procedure: LUMBAR PERCUTANEOUS PEDICLE SCREW PLACEMENT LUMBAR THREE-SACRAL ONE;  Surgeon: Elna Haggis, MD;  Location: [facility] OR;  Service: Neurosurgery;  Laterality: N/A;   SHOULDER ARTHROSCOPY Right    TOE SURGERY Left 01/10/2003   claw toe correction 2nd, 3rd, 4th toes   TOTAL HIP ARTHROPLASTY Right 04/21/2016   TOTAL HIP ARTHROPLASTY Right 04/21/2016    Procedure: TOTAL HIP ARTHROPLASTY ANTERIOR APPROACH;  Surgeon: Ferd Householder, MD;  Location[facility] OR;  Service: Orthopedics;  Laterality: Right;   TOTAL KNEE ARTHROPLASTY Left 01/21/2010   TOTAL KNEE ARTHROPLASTY Right 08/26/2008   UPPER GI ENDOSCOPY N/A 04/14/2021   Procedure: UPPER GI ENDOSCOPY;  Surgeon: Jacolyn Matar, MD;  Location: [facility] ORS;  Service: General;  Laterality: N/A;    OB History     Gravida  4   Para  2   Term  1   Preterm  1   AB  2   Living  2      SAB  1   IAB  1   Ectopic      Multiple      Live Births  2            Home Medications    Prior to Admission medications   Medication Sig Start Date End Date Taking? Authorizing Provider  Albuterol  Sulfate (PROAIR  RESPICLICK) 108 (90 Base) MCG/ACT AEPB Inhale 1 puff  into the lungs every 4 (four) hours as needed. 01/10/22   Crain, Whitney L, PA  amLODipine  (NORVASC ) 5 MG tablet  05/22/20   [provider]  baclofen  (LIORESAL ) 10 MG tablet Take 1 tablet by mouth twice a day 01/02/24     Boric Acid 600 MG SUPP AS NEEDED 08/15/23   [provider]  buPROPion  (WELLBUTRIN  XL) 300 MG 24 hr tablet Take 300 mg by mouth daily.    [provider]  docusate sodium  (COLACE) 100 MG capsule     [provider]  EPINEPHrine  0.3 mg/0.3 mL IJ SOAJ injection Inject 3 mLs into the muscle once. 05/26/18   [provider]  fluticasone  (FLONASE ) 50 MCG/ACT nasal spray Place 1 spray into both nostrils daily.    [provider]  furosemide  (LASIX ) 20 MG tablet Take 20 mg by mouth daily as needed for edema. 02/18/21   [provider]  levocetirizine (XYZAL ALLERGY 24HR) 5 MG tablet     [provider]  lidocaine  (LIDODERM ) 5 % Place 3 patches onto the skin as directed. 01/02/24     lisdexamfetamine (VYVANSE) 50 MG capsule Take 50 mg by mouth daily.    [provider]  losartan -hydrochlorothiazide  (HYZAAR) 100-12.5 MG tablet Take 1 tablet by mouth daily.  01/29/21   [provider]  Multiple Vitamin (MULTIVITAMIN) tablet Take 1 tablet by mouth daily. Bariatric vitamins    [provider]  naproxen  (NAPROSYN ) 500 MG tablet Take 1 tablet (500 mg total) by mouth 2 (two) times daily with a meal. 12/31/23   Maryruth Sol, FNP  oxyCODONE -acetaminophen  (PERCOCET) 10-325 MG tablet Take 1 tablet by mouth every 6 (six) hours as directed 01/30/24     pregabalin  (LYRICA ) 75 MG capsule Take 1 capsule (75 mg total) by mouth 3 (three) times daily. 01/02/24     rosuvastatin  (CRESTOR ) 10 MG tablet Take 1 tablet (10 mg total) by mouth daily. 05/16/23 02/09/24  Olinda Bertrand, DO    Family History Family History  Problem Relation Age of Onset   Diabetes Father    Cancer Father        COLON   Heart disease Father    Kidney disease Father        dialysis   High blood pressure Father    Heart disease Sister    Diabetes Sister    Heart failure Sister    Heart failure Sister    Heart disease Sister    Cancer Maternal Grandmother        UTERINE   Cancer Paternal Grandmother        breast   Breast cancer Paternal Grandmother    Breast cancer Cousin     Social History Social History   Tobacco Use   Smoking status: Never   Smokeless tobacco: Never  Vaping Use   Vaping status: Never Used  Substance Use Topics   Alcohol use: No    Alcohol/week: 0.0 standard drinks of alcohol   Drug use: No     Allergies   Gadolinium derivatives, Codeine, Duloxetine, and Duloxetine hcl   Review of Systems Review of Systems   Physical Exam Triage Vital Signs ED Triage Vitals  Encounter Vitals Group     BP 02/10/24 0948 (!) 156/89     Systolic BP Percentile --      Diastolic BP Percentile --      Pulse Rate 02/10/24 0948 75     Resp 02/10/24 0948 18     Temp  02/10/24 0948 98.8 F (37.1 C)     Temp Source 02/10/24 0948 Oral     SpO2 02/10/24 0948 98 %     Weight 02/10/24 0946 250 lb (113.4 kg)     Height 02/10/24 0946 5\' 11"  (1.803 m)      Head Circumference --      Peak Flow --      Pain Score 02/10/24 0942 4     Pain Loc --      Pain Education --      Exclude from Growth Chart --    No data found.  Updated Vital Signs BP (!) 156/89 (BP Location: Left Arm)   Pulse 75   Temp 98.8 F (37.1 C) (Oral)   Resp 18   Ht 5\' 11"  (1.803 m)   Wt 113.4 kg   SpO2 98%   BMI 34.87 kg/m   Visual Acuity Right Eye Distance:   Left Eye Distance:   Bilateral Distance:    Right Eye Near:   Left Eye Near:    Bilateral Near:     Physical Exam Vitals reviewed.  Constitutional:      General: She is not in acute distress.    Appearance: She is not ill-appearing, toxic-appearing or diaphoretic.  Skin:    Coloration: Skin is not pale.     Comments: Staff had cleaned the wound.  It was no longer bleeding when I examined her.  It is a shallow laceration/abrasion about 1.5 cm long.  Initially when she would bend it, it would gape open just a little bit.  Neurological:     Mental Status: She is alert and oriented to person, place, and time.  Psychiatric:        Behavior: Behavior normal.      UC Treatments / Results  Labs (all labs ordered are listed, but only abnormal results are displayed) Labs Reviewed - No data to display  EKG   Radiology No results found.  Procedures Procedures (including critical care time)  Medications Ordered in UC Medications  Tdap (BOOSTRIX) injection 0.5 mL (has no administration in time range)    Initial Impression / Assessment and Plan / UC Course  I have reviewed the triage vital signs and the nursing notes.  Pertinent labs & imaging results that were available during my care of the patient were reviewed by me and considered in my medical decision making (see chart for details).     Initially we had decided to do sutures to keep it from gaping open, since its on her PIP joint of that finger.  When I went back in with the anesthesia to start the digital block, we reexamined and  it really was not gaping open.  Therefore, I do not think she needs suture closure of the laceration.  Bandages applied with bacitracin  and a finger splint is applied to keep her finger straight while it is healing.  Wound care is explained.  Tdap is given to update her tetanus immunity Final Clinical Impressions(s) / UC Diagnoses   Final diagnoses:  Laceration of left index finger without foreign body without damage to nail, initial encounter     Discharge Instructions      You have been given a Tdap vaccination to boost your tetanus immunity  Once daily wash the cut with soapy water and then put new triple antibiotic ointment on it.  You can wear the finger splint to keep you from bending your finger very much, while the cut heals.  ED Prescriptions   None    PDMP not reviewed this encounter.   Ann Keto, MD 02/10/24 1024

## 2024-02-10 NOTE — ED Triage Notes (Signed)
"  I cut my finger this morning (left index)". "I was opening a package (that had knives in it) but the casing (plastic) they were in cut me". Last Tdap "2015".

## 2024-02-13 ENCOUNTER — Other Ambulatory Visit (HOSPITAL_COMMUNITY): Payer: Self-pay

## 2024-02-14 ENCOUNTER — Other Ambulatory Visit (HOSPITAL_COMMUNITY): Payer: Self-pay

## 2024-02-14 DIAGNOSIS — I10 Essential (primary) hypertension: Secondary | ICD-10-CM | POA: Diagnosis not present

## 2024-02-15 ENCOUNTER — Ambulatory Visit: Admitting: Family Medicine

## 2024-02-18 DIAGNOSIS — I10 Essential (primary) hypertension: Secondary | ICD-10-CM | POA: Diagnosis not present

## 2024-02-18 DIAGNOSIS — E7800 Pure hypercholesterolemia, unspecified: Secondary | ICD-10-CM | POA: Diagnosis not present

## 2024-02-20 DIAGNOSIS — J301 Allergic rhinitis due to pollen: Secondary | ICD-10-CM | POA: Diagnosis not present

## 2024-02-20 DIAGNOSIS — J3089 Other allergic rhinitis: Secondary | ICD-10-CM | POA: Diagnosis not present

## 2024-02-20 DIAGNOSIS — J3081 Allergic rhinitis due to animal (cat) (dog) hair and dander: Secondary | ICD-10-CM | POA: Diagnosis not present

## 2024-02-21 ENCOUNTER — Ambulatory Visit (INDEPENDENT_AMBULATORY_CARE_PROVIDER_SITE_OTHER)

## 2024-02-21 ENCOUNTER — Encounter: Payer: Self-pay | Admitting: Podiatry

## 2024-02-21 ENCOUNTER — Ambulatory Visit (INDEPENDENT_AMBULATORY_CARE_PROVIDER_SITE_OTHER): Admitting: Podiatry

## 2024-02-21 DIAGNOSIS — M778 Other enthesopathies, not elsewhere classified: Secondary | ICD-10-CM | POA: Diagnosis not present

## 2024-02-21 DIAGNOSIS — M7751 Other enthesopathy of right foot: Secondary | ICD-10-CM

## 2024-02-21 NOTE — Progress Notes (Signed)
 Ashley [name] thank  Chief Complaint  Patient presents with   Toe Pain    Pt is here due toenail sensitivity on right 3rd toenail and left 2nd toenail, states she notice the sensitivity a few weeks ago especially when she touches it. Also complains of spot on left great toenail states she thinks it could be a fungus and wants to get that checked out as well.    HPI: 48 y.o. female presenting today for chronic bilateral foot pain.  History of multiple surgeries bilateral feet and toes.  Patient has been experiencing chronic bilateral foot pain.   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   Complication of anesthesia    Depression    Depression    History of pneumonia    Hypertension    Infertility, female    Joint pain    Lumbar spondylosis    Osteoarthritis    PCOS (polycystic ovarian syndrome)    Pneumonia    2016   PONV (postoperative nausea and vomiting)    Swelling of lower extremity     Past Surgical History:  Procedure Laterality Date   ABDOMINAL EXPOSURE N/A 02/17/2018   Procedure: ABDOMINAL EXPOSURE;  Surgeon: Mayo Speck, MD;  [facility]: [facility];  Service: Vascular;  Laterality: N/A;   ANKLE SURGERY     x 2   ANTERIOR LAT LUMBAR FUSION N/A 02/17/2018   Procedure: Lumbar three-four Lumbar four-five  Anterolateral lumbar interbody fusion;  Surgeon: Elna Haggis, MD;  Location: [facility];  Service: Neurosurgery;  Laterality: N/A;   ANTERIOR LUMBAR FUSION N/A 02/17/2018   Procedure: Lumbar five-Sacral one Anterior lumbar interbody fusion;  Surgeon: Elna Haggis, MD;  Location[facility] OR;  Service: Neurosurgery;  Laterality: N/A;   APPLICATION OF ROBOTIC ASSISTANCE FOR SPINAL PROCEDURE N/A 02/17/2018   Procedure: APPLICATION OF ROBOTIC ASSISTANCE FOR SPINAL PROCEDURE;  Surgeon: Elna Haggis, MD;  [facility]: [facility];  Service: Neurosurgery;  Laterality: N/A;   APPLICATION OF ROBOTIC  ASSISTANCE FOR SPINAL PROCEDURE N/A 06/09/2018   Procedure: APPLICATION OF ROBOTIC ASSISTANCE FOR SPINAL PROCEDURE;  Surgeon: Elna Haggis, MD;  [facility];  Service: Neurosurgery;  Laterality: N/A;   BACK SURGERY     2017  discectomy   CARPAL TUNNEL RELEASE Left 07/04/2014   Procedure: LEFT CARPAL TUNNEL RELEASE;  Surgeon: Ferd Householder, MD;  Location: [facility];  Service: Orthopedics;  Laterality: Left;   CARPAL TUNNEL RELEASE Right 08/08/2014   Procedure: RIGHT CARPAL TUNNEL RELEASE;  Surgeon: Ferd Householder, MD;  Location: [facility];  Service: Orthopedics;  Laterality: Right;   DILATION AND CURETTAGE OF UTERUS     DILATION AND EVACUATION  04/02/2011   Procedure: DILATATION AND EVACUATION (D&E);  Surgeon: Denette Finner, MD;  Location: [facility];  Service: Gynecology;  Laterality: N/A;  dvt left mid thigh   DORSAL COMPARTMENT RELEASE Left 07/04/2014   Procedure: LEFT DEQUERVAINS;  Surgeon: Ferd Householder, MD;  Location: [facility];  Service: Orthopedics;  Laterality: Left;   ENDOSCOPIC PLANTAR FASCIOTOMY Left 02/01/2002   HARDWARE REMOVAL Right 06/09/2018   Procedure: Repositioning of Right Lumbar three Right lumbar  four pedicle screw with MAZOR;  Surgeon: Elna Haggis, MD;  Location: [facility] OR;  Service: Neurosurgery;  Laterality: Right;   HYSTEROSCOPY WITH D & C  07/17/2010  with exc. endometrial polyps   KNEE ARTHROSCOPY Right 03/07/2003; 01/14/2005; 08/31/2006   KNEE ARTHROSCOPY Left 03/21/2003; 11/26/2004   LUMBAR PERCUTANEOUS PEDICLE SCREW 3 LEVEL N/A 02/17/2018   Procedure: LUMBAR PERCUTANEOUS PEDICLE SCREW PLACEMENT LUMBAR THREE-SACRAL ONE;  Surgeon: Elna Haggis, MD;  Location: [facility];  Service: Neurosurgery;  Laterality: N/A;   SHOULDER ARTHROSCOPY Right    TOE SURGERY Left 01/10/2003   claw toe correction 2nd, 3rd, 4th toes   TOTAL HIP ARTHROPLASTY Right 04/21/2016   TOTAL HIP ARTHROPLASTY Right 04/21/2016   Procedure: TOTAL HIP  ARTHROPLASTY ANTERIOR APPROACH;  Surgeon: Ferd Householder, MD;  Location: [facility] OR;  Service: Orthopedics;  Laterality: Right;   TOTAL KNEE ARTHROPLASTY Left 01/21/2010   TOTAL KNEE ARTHROPLASTY Right 08/26/2008   UPPER GI ENDOSCOPY N/A 04/14/2021   Procedure: UPPER GI ENDOSCOPY;  Surgeon: Jacolyn Matar, MD;  Location: [facility];  Service: General;  Laterality: N/A;    Allergies  Allergen Reactions   Gadolinium Derivatives Nausea And Vomiting and Other (See Comments)    Pt describes this happens every time she gets gado even with slow injection  Other Reaction(s): Other (See Comments)   Codeine Itching, Nausea Only and Other (See Comments)    Hydrocodone  does not affect pt   Duloxetine     Other reaction(s): insomia  Other Reaction(s): Other (See Comments)  Other reaction(s): insomia  Altered mental status   Duloxetine Hcl Other (See Comments)    Altered mental status  Other Reaction(s): depression, other  Other Reaction(s): insomia, other  Cymbalta     Physical Exam: General: The patient is alert and oriented x3 in no acute distress.  Dermatology: Hyperkeratotic skin lesion noted to the plantar lateral aspect of the fifth MTP right foot with associated tenderness to palpation.  Vascular: Palpable pedal pulses bilaterally. Capillary refill within normal limits.  Negative for any significant edema or erythema  Neurological: Light touch and protective threshold grossly intact  Musculoskeletal Exam: No pedal deformities noted.  Diffuse tenderness throughout palpation to the toes and bilateral forefoot  Radiographic exam/L feet 02/21/2024: Unchanged from prior x-rays.  Hardware fixation is intact.  The bent screw to the right third toe is mostly unchanged from prior x-rays.  Stable.  Assessment: 1.  Diffuse generalized chronic foot pain bilateral  -Patient evaluated.  X-rays reviewed -Patient has revision of a right hip arthroplasty with implant coming up in 2 months.  From a  podiatry standpoint the feet are stable.  No intervention today -Continue good supportive tennis shoes and sneakers -Return to clinic PRN   Dot Gazella, DPM [facility]  Dr. Dot Gazella, DPM    [address] [place], [place] [address]                Office [phone]  Fax [phone]

## 2024-02-23 DIAGNOSIS — R61 Generalized hyperhidrosis: Secondary | ICD-10-CM | POA: Diagnosis not present

## 2024-02-23 DIAGNOSIS — I10 Essential (primary) hypertension: Secondary | ICD-10-CM | POA: Diagnosis not present

## 2024-02-23 DIAGNOSIS — R8290 Unspecified abnormal findings in urine: Secondary | ICD-10-CM | POA: Diagnosis not present

## 2024-02-23 DIAGNOSIS — M62838 Other muscle spasm: Secondary | ICD-10-CM | POA: Diagnosis not present

## 2024-02-23 DIAGNOSIS — G8929 Other chronic pain: Secondary | ICD-10-CM | POA: Diagnosis not present

## 2024-02-23 DIAGNOSIS — Z0000 Encounter for general adult medical examination without abnormal findings: Secondary | ICD-10-CM | POA: Diagnosis not present

## 2024-02-23 DIAGNOSIS — R600 Localized edema: Secondary | ICD-10-CM | POA: Diagnosis not present

## 2024-02-23 DIAGNOSIS — D509 Iron deficiency anemia, unspecified: Secondary | ICD-10-CM | POA: Diagnosis not present

## 2024-02-23 DIAGNOSIS — E7800 Pure hypercholesterolemia, unspecified: Secondary | ICD-10-CM | POA: Diagnosis not present

## 2024-02-27 ENCOUNTER — Other Ambulatory Visit: Payer: Self-pay

## 2024-02-27 ENCOUNTER — Other Ambulatory Visit (HOSPITAL_COMMUNITY): Payer: Self-pay

## 2024-02-27 ENCOUNTER — Ambulatory Visit: Attending: Cardiovascular Disease | Admitting: Emergency Medicine

## 2024-02-27 ENCOUNTER — Encounter: Payer: Self-pay | Admitting: Cardiology

## 2024-02-27 DIAGNOSIS — G4700 Insomnia, unspecified: Secondary | ICD-10-CM | POA: Diagnosis not present

## 2024-02-27 DIAGNOSIS — Z01810 Encounter for preprocedural cardiovascular examination: Secondary | ICD-10-CM

## 2024-02-27 DIAGNOSIS — G894 Chronic pain syndrome: Secondary | ICD-10-CM | POA: Diagnosis not present

## 2024-02-27 DIAGNOSIS — M47817 Spondylosis without myelopathy or radiculopathy, lumbosacral region: Secondary | ICD-10-CM | POA: Diagnosis not present

## 2024-02-27 MED ORDER — OXYCODONE-ACETAMINOPHEN 10-325 MG PO TABS
1.0000 | ORAL_TABLET | Freq: Four times a day (QID) | ORAL | 0 refills | Status: DC
  Filled 2024-02-27 – 2024-03-16 (×2): qty 120, 30d supply, fill #0

## 2024-02-27 MED ORDER — PREGABALIN 75 MG PO CAPS
ORAL_CAPSULE | ORAL | 0 refills | Status: DC
  Filled 2024-02-27: qty 270, 90d supply, fill #0

## 2024-02-27 MED ORDER — LIDOCAINE 5 % EX PTCH
MEDICATED_PATCH | CUTANEOUS | 0 refills | Status: AC
  Filled 2024-02-27 – 2024-03-16 (×2): qty 90, 30d supply, fill #0

## 2024-02-27 MED ORDER — OXYCODONE-ACETAMINOPHEN 10-325 MG PO TABS: ORAL_TABLET | ORAL | 0 refills | Status: DC

## 2024-02-27 MED ORDER — BACLOFEN 10 MG PO TABS
10.0000 mg | ORAL_TABLET | Freq: Two times a day (BID) | ORAL | 0 refills | Status: DC
  Filled 2024-02-27: qty 180, 90d supply, fill #0

## 2024-02-27 NOTE — Progress Notes (Signed)
 Virtual Visit via Telephone Note   Because of Ashley [name] co-morbid illnesses, she is at least at moderate risk for complications without adequate follow up.  This format is felt to be most appropriate for this patient at this time.  Due to technical limitations with video connection Web designer), today's appointment will be conducted as an audio only telehealth visit, and Ashley [name] verbally agreed to proceed in this manner.   All issues noted in this document were discussed and addressed.  No physical exam could be performed with this format.  Evaluation Performed:  Preoperative cardiovascular risk assessment _____________   Date:  02/27/2024   Patient ID:  Ashley [name], DOB 1976/01/07, MRN [id number] Patient Location:  Home Provider location:   Office  Primary Care Provider:  Ilsa Maltese, PA Primary Cardiologist:  Olinda Bertrand, DO  Chief Complaint / Patient Profile   48 y.o. y/o female with a h/o coronary artery disease, hypertension, PCOS, general anxiety disorder, hypercholesteremia, obesity, s/p gastric sleeve, history of DVT, aortic atherosclerosis, hypertension who is pending revision right total hip arthroplasty with [facility] on 04/04/2024 and presents today for telephonic preoperative cardiovascular risk assessment.  History of Present Illness    Ashley [name] is a 48 y.o. female who presents via audio/video conferencing for a telehealth visit today.  Pt was last seen in cardiology clinic on 10/12/2023 by Dr. Albert Huff.  At that time Ashley [name] was doing well.  The patient is now pending procedure as outlined above. Since her last visit, she denies chest pain, shortness of breath, lower extremity edema, fatigue, palpitations, melena, hematuria, hemoptysis, diaphoresis, weakness, presyncope, syncope, orthopnea, and PND.  Today patient is doing well overall.  She has been without any acute cardiovascular concerns or complaints.  She denies any chest  pains or shortness of breath today and since her last office visit.  She stays relatively active and walks daily either in her neighborhood or at the [facility].  She stays busy traveling with her daughter.  She also notes she does all types of housework without limitation.  She is without any angina, no indication for further ischemic evaluation at this time.  Patient's able to complete greater than 4 METS.  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   Complication of anesthesia    Depression    Depression    History of pneumonia    Hypertension    Infertility, female    Joint pain    Lumbar spondylosis    Osteoarthritis    PCOS (polycystic ovarian syndrome)    Pneumonia    2016   PONV (postoperative nausea and vomiting)    Swelling of lower extremity    Past Surgical History:  Procedure Laterality Date   ABDOMINAL EXPOSURE N/A 02/17/2018   Procedure: ABDOMINAL EXPOSURE;  Surgeon: Mayo Speck, MD;  [facility];  Service: Vascular;  Laterality: N/A;   ANKLE SURGERY     x 2   ANTERIOR LAT LUMBAR FUSION N/A 02/17/2018   Procedure: Lumbar three-four Lumbar four-five  Anterolateral lumbar interbody fusion;  Surgeon: Elna Haggis, MD;  Location: [facility];  Service: Neurosurgery;  Laterality: N/A;   ANTERIOR LUMBAR FUSION N/A 02/17/2018   Procedure: Lumbar five-Sacral one Anterior lumbar interbody fusion;  Surgeon: Ellery Guthrie,  Ace Abu, MD;  Location: [facility] OR;  Service: Neurosurgery;  Laterality: N/A;   APPLICATION OF ROBOTIC ASSISTANCE FOR SPINAL PROCEDURE N/A 02/17/2018   Procedure: APPLICATION OF ROBOTIC ASSISTANCE FOR SPINAL PROCEDURE;  Surgeon: Elna Haggis, MD;  [facility];  Service: Neurosurgery;  Laterality: N/A;   APPLICATION OF ROBOTIC ASSISTANCE FOR SPINAL PROCEDURE N/A 06/09/2018   Procedure: APPLICATION OF ROBOTIC ASSISTANCE FOR SPINAL PROCEDURE;   Surgeon: Elna Haggis, MD;  [facility]: [facility];  Service: Neurosurgery;  Laterality: N/A;   BACK SURGERY     2017  discectomy   CARPAL TUNNEL RELEASE Left 07/04/2014   Procedure: LEFT CARPAL TUNNEL RELEASE;  Surgeon: Ferd Householder, MD;  Location: [facility];  Service: Orthopedics;  Laterality: Left;   CARPAL TUNNEL RELEASE Right 08/08/2014   Procedure: RIGHT CARPAL TUNNEL RELEASE;  Surgeon: Ferd Householder, MD;  Location: [facility];  Service: Orthopedics;  Laterality: Right;   DILATION AND CURETTAGE OF UTERUS     DILATION AND EVACUATION  04/02/2011   Procedure: DILATATION AND EVACUATION (D&E);  Surgeon: Denette Finner, MD;  Location: [facility];  Service: Gynecology;  Laterality: N/A;  dvt left mid thigh   DORSAL COMPARTMENT RELEASE Left 07/04/2014   Procedure: LEFT DEQUERVAINS;  Surgeon: Ferd Householder, MD;  Location: [facility];  Service: Orthopedics;  Laterality: Left;   ENDOSCOPIC PLANTAR FASCIOTOMY Left 02/01/2002   HARDWARE REMOVAL Right 06/09/2018   Procedure: Repositioning of Right Lumbar three Right lumbar  four pedicle screw with MAZOR;  Surgeon: Elna Haggis, MD;  Location: [facility] OR;  Service: Neurosurgery;  Laterality: Right;   HYSTEROSCOPY WITH D & C  07/17/2010   with exc. endometrial polyps   KNEE ARTHROSCOPY Right 03/07/2003; 01/14/2005; 08/31/2006   KNEE ARTHROSCOPY Left 03/21/2003; 11/26/2004   LUMBAR PERCUTANEOUS PEDICLE SCREW 3 LEVEL N/A 02/17/2018   Procedure: LUMBAR PERCUTANEOUS PEDICLE SCREW PLACEMENT LUMBAR THREE-SACRAL ONE;  Surgeon: Elna Haggis, MD;  Location: [facility];  Service: Neurosurgery;  Laterality: N/A;   SHOULDER ARTHROSCOPY Right    TOE SURGERY Left 01/10/2003   claw toe correction 2nd, 3rd, 4th toes   TOTAL HIP ARTHROPLASTY Right 04/21/2016   TOTAL HIP ARTHROPLASTY Right 04/21/2016   Procedure: TOTAL HIP ARTHROPLASTY ANTERIOR APPROACH;  Surgeon: Ferd Householder, MD;  Location: [facility] OR;  Service: Orthopedics;  Laterality: Right;    TOTAL KNEE ARTHROPLASTY Left 01/21/2010   TOTAL KNEE ARTHROPLASTY Right 08/26/2008   UPPER GI ENDOSCOPY N/A 04/14/2021   Procedure: UPPER GI ENDOSCOPY;  Surgeon: Jacolyn Matar, MD;  Location: [facility];  Service: General;  Laterality: N/A;    Allergies  Allergies  Allergen Reactions   Gadolinium Derivatives Nausea And Vomiting and Other (See Comments)    Pt describes this happens every time she gets gado even with slow injection  Other Reaction(s): Other (See Comments)   Codeine Itching, Nausea Only and Other (See Comments)    Hydrocodone  does not affect pt   Duloxetine     Other reaction(s): insomia  Other Reaction(s): Other (See Comments)  Other reaction(s): insomia  Altered mental status   Duloxetine Hcl Other (See Comments)    Altered mental status  Other Reaction(s): depression, other  Other Reaction(s): insomia, other  Cymbalta    Home Medications    Prior to Admission medications   Medication Sig Start Date End Date Taking? Authorizing Provider  Albuterol  Sulfate (PROAIR  RESPICLICK) 108 (90 Base) MCG/ACT AEPB Inhale 1 puff into the lungs every 4 (four) hours as needed.  01/10/22   Crain, Whitney L, PA  amLODipine  (NORVASC ) 5 MG tablet  05/22/20   [provider]  baclofen  (LIORESAL ) 10 MG tablet Take 1 tablet by mouth twice a day 01/02/24     Boric Acid 600 MG SUPP AS NEEDED 08/15/23   [provider]  buPROPion  (WELLBUTRIN  XL) 300 MG 24 hr tablet Take 300 mg by mouth daily.    [provider]  docusate sodium  (COLACE) 100 MG capsule     [provider]  EPINEPHrine  0.3 mg/0.3 mL IJ SOAJ injection Inject 3 mLs into the muscle once. 05/26/18   [provider]  fluticasone  (FLONASE ) 50 MCG/ACT nasal spray Place 1 spray into both nostrils daily.    [provider]  furosemide  (LASIX ) 20 MG tablet Take 20 mg by mouth daily as needed for edema. 02/18/21   [provider]  levocetirizine (XYZAL ALLERGY 24HR) 5 MG tablet      [provider]  lidocaine  (LIDODERM ) 5 % Place 3 patches onto the skin as directed. 01/02/24     lisdexamfetamine (VYVANSE) 50 MG capsule Take 50 mg by mouth daily.    [provider]  losartan -hydrochlorothiazide  (HYZAAR) 100-12.5 MG tablet Take 1 tablet by mouth daily. 01/29/21   [provider]  Multiple Vitamin (MULTIVITAMIN) tablet Take 1 tablet by mouth daily. Bariatric vitamins    [provider]  naproxen  (NAPROSYN ) 500 MG tablet Take 1 tablet (500 mg total) by mouth 2 (two) times daily with a meal. 12/31/23   Maryruth Sol, FNP  oxyCODONE -acetaminophen  (PERCOCET) 10-325 MG tablet Take 1 tablet by mouth every 6 (six) hours as directed 01/30/24     pregabalin  (LYRICA ) 75 MG capsule Take 1 capsule (75 mg total) by mouth 3 (three) times daily. 01/02/24     rosuvastatin  (CRESTOR ) 10 MG tablet Take 1 tablet (10 mg total) by mouth daily. 05/16/23 02/09/24  Olinda Bertrand, DO    Physical Exam    Vital Signs:  Ashley [name] does not have vital signs available for review today.  Given telephonic nature of communication, physical exam is limited. AAOx3. NAD. Normal affect.  Speech and respirations are unlabored.  Accessory Clinical Findings    None  Assessment & Plan    1.  Preoperative Cardiovascular Risk Assessment: According to the Revised Cardiac Risk Index (RCRI), her Perioperative Risk of Major Cardiac Event is (%): 0.4. Her Functional Capacity in METs is: 6.27 according to the Duke Activity Status Index (DASI). Therefore, based on ACC/AHA guidelines, patient would be at acceptable risk for the planned procedure without further cardiovascular testing.  The patient was advised that if she develops new symptoms prior to surgery to contact our office to arrange for a follow-up visit, and she verbalized understanding.   A copy of this note will be routed to requesting surgeon.  Time:   Today, I have spent 6 minutes with the patient with telehealth  technology discussing medical history, symptoms, and management plan.     Ava Boatman, NP  02/27/2024, 9:27 AM

## 2024-02-28 ENCOUNTER — Other Ambulatory Visit: Payer: Self-pay

## 2024-02-28 ENCOUNTER — Other Ambulatory Visit (HOSPITAL_COMMUNITY): Payer: Self-pay

## 2024-02-29 DIAGNOSIS — J3081 Allergic rhinitis due to animal (cat) (dog) hair and dander: Secondary | ICD-10-CM | POA: Diagnosis not present

## 2024-02-29 DIAGNOSIS — J3089 Other allergic rhinitis: Secondary | ICD-10-CM | POA: Diagnosis not present

## 2024-02-29 DIAGNOSIS — J301 Allergic rhinitis due to pollen: Secondary | ICD-10-CM | POA: Diagnosis not present

## 2024-03-01 DIAGNOSIS — M5116 Intervertebral disc disorders with radiculopathy, lumbar region: Secondary | ICD-10-CM | POA: Diagnosis not present

## 2024-03-01 DIAGNOSIS — M5416 Radiculopathy, lumbar region: Secondary | ICD-10-CM | POA: Diagnosis not present

## 2024-03-05 ENCOUNTER — Ambulatory Visit (INDEPENDENT_AMBULATORY_CARE_PROVIDER_SITE_OTHER): Admitting: Family Medicine

## 2024-03-05 ENCOUNTER — Encounter: Payer: Self-pay | Admitting: Family Medicine

## 2024-03-05 VITALS — BP 156/87 | HR 75 | Temp 98.5°F | Ht 71.0 in | Wt 256.0 lb

## 2024-03-05 DIAGNOSIS — Z903 Acquired absence of stomach [part of]: Secondary | ICD-10-CM

## 2024-03-05 DIAGNOSIS — Z6835 Body mass index (BMI) 35.0-35.9, adult: Secondary | ICD-10-CM | POA: Diagnosis not present

## 2024-03-05 DIAGNOSIS — G894 Chronic pain syndrome: Secondary | ICD-10-CM

## 2024-03-05 DIAGNOSIS — R7303 Prediabetes: Secondary | ICD-10-CM

## 2024-03-05 DIAGNOSIS — E66812 Obesity, class 2: Secondary | ICD-10-CM

## 2024-03-05 NOTE — Progress Notes (Signed)
 Office: [phone]  /  Fax: [phone]  WEIGHT SUMMARY AND BIOMETRICS  Starting Date: 11/03/23  Starting Weight: 254lb   Weight Lost Since Last Visit: 1lb   Vitals Temp: 98.5 F (36.9 C) BP: (!) 156/87 Pulse Rate: 75 SpO2: 99 %   Body Composition  Body Fat %: 45.8 % Fat Mass (lbs): 117.4 lbs Muscle Mass (lbs): 131.8 lbs Total Body Water (lbs): 101.2 lbs Visceral Fat Rating : 12    HPI  Chief Complaint: OBESITY  Stefanie is here to discuss her progress with her obesity treatment plan. She is on the keeping a food journal and adhering to recommended goals of 1600 calories and 110-130 protein and states she is following her eating plan approximately 40 % of the time. She states she is exercising 0 minutes 0 times per week.  Interval History:  Since last office visit she is down 1 lb She did try Phentermine  1/2 tab after her last visit but had issues with constipation She struggles to get in enough water She will be getting R hip revision surgery 7/16 With the added stress, she is eating out more She has been snacking more but has been trying to make better choices She is eating bigger food volumes Denies many sugar cravings (with some occasional indulgences) She has a weight gain of 2 pounds in 4 months  Pharmacotherapy: none  PHYSICAL EXAM:  Blood pressure (!) 156/87, pulse 75, temperature 98.5 F (36.9 C), height 5' 11 (1.803 m), weight 256 lb (116.1 kg), SpO2 99%. Body mass index is 35.7 kg/m.  General: She is overweight, cooperative, alert, well developed, and in no acute distress. PSYCH: Has normal mood, affect and thought process.   Lungs: Normal breathing effort, no conversational dyspnea.  ASSESSMENT AND PLAN  TREATMENT PLAN FOR OBESITY:  Recommended Dietary Goals  Aspen is currently in the action stage of change. As such, her goal is to continue weight management plan. She has agreed to keeping a food journal and adhering to recommended  goals of 1800-2000 calories and 120 g of  protein and practicing portion control and making smarter food choices, such as increasing vegetables and decreasing simple carbohydrates.  Behavioral Intervention  We discussed the following Behavioral Modification Strategies today: increasing lean protein intake to established goals, increasing fiber rich foods, increasing water intake , work on meal planning and preparation, work on Counselling psychologist calories using tracking application, keeping healthy foods at home, work on managing stress, creating time for self-care and relaxation, avoiding temptations and identifying enticing environmental cues, planning for success, better snacking choices, and continue to work on maintaining a reduced calorie state, getting the recommended amount of protein, incorporating whole foods, making healthy choices, staying well hydrated and practicing mindfulness when eating..  Additional resources provided today: NA  Recommended Physical Activity Goals  Derenda has been advised to work up to 150 minutes of moderate intensity aerobic activity a week and strengthening exercises 2-3 times per week for cardiovascular health, weight loss maintenance and preservation of muscle mass.   She has agreed to Think about enjoyable ways to increase daily physical activity and overcoming barriers to exercise and Increase physical activity in their day and reduce sedentary time (increase NEAT).  Pharmacotherapy changes for the treatment of obesity: Discontinue prescription phentermine  due to elevated blood pressure, constipation and lack of weight loss  ASSOCIATED CONDITIONS ADDRESSED TODAY  Chronic pain syndrome Worsening.  Patient has right hip pain, awaiting revision right hip surgery scheduled for 7/16.  She will go home with a PICC line and have limited his ability to exercise following the surgery.  Encouraged her to focus on rest, good nutrition and stress reduction Keep  upcoming visits with Ortho as scheduled  Class 2 obesity due to excess calories with body mass index (BMI) of 35.0 to 35.9 in adult, unspecified whether serious comorbidity present  Status post sleeve gastrectomy She has limited restriction from her vertical sleeve gastrectomy done in July 2022 with Dr. Gaylyn Keas.  She is down from her highest weight of 308 pounds but up from her nadir weight of 238 pounds.  She has failed to see much improvement in appetite control with phentermine  and this has been discontinued.  She does lack insurance coverage for use of a GLP-1 receptor agonist and is awaiting an upcoming surgery.  Recommend adding in a fiber supplement, increasing water intake as well as fruit and vegetable intake.  Recommend tracking protein intake with a goal of 120 g/day for improved satiety.  Prediabetes Lab Results  Component Value Date   HGBA1C 5.5 11/03/2023   She has seen improvements in her A1c with dietary change, weight loss and exercise.  She is no longer in the prediabetic range based on her last A1c.     She was informed of the importance of frequent follow up visits to maximize her success with intensive lifestyle modifications for her multiple health conditions.   ATTESTASTION STATEMENTS:  Reviewed by clinician on day of visit: allergies, medications, problem list, medical history, surgical history, family history, social history, and previous encounter notes pertinent to obesity diagnosis.   I have personally spent 30 minutes total time today in preparation, patient care, nutritional counseling and education,  and documentation for this visit, including the following: review of most recent clinical lab tests, prescribing medications/ refilling medications, reviewing medical assistant documentation, review and interpretation of bioimpedence results.     Micky Albee, D.O. DABFM, DABOM [facility] [address] [place], [place] [address] [phone]

## 2024-03-05 NOTE — Patient Instructions (Addendum)
 Track calorie intake using the MyNetDiary App: Aim for 1700- 1800 cal/ day This should include 120 g/ day  Add 2 tbsp of chia seeds (add to milk for chia pudding or add to oatmeal or greek yogurt) Drink more water ! You have room for more fruits and veggies  You may resume Vyvanse RX Focus on upper body/ core exercises

## 2024-03-08 ENCOUNTER — Other Ambulatory Visit (HOSPITAL_COMMUNITY): Payer: Self-pay

## 2024-03-08 DIAGNOSIS — J3081 Allergic rhinitis due to animal (cat) (dog) hair and dander: Secondary | ICD-10-CM | POA: Diagnosis not present

## 2024-03-08 DIAGNOSIS — J301 Allergic rhinitis due to pollen: Secondary | ICD-10-CM | POA: Diagnosis not present

## 2024-03-08 DIAGNOSIS — J3089 Other allergic rhinitis: Secondary | ICD-10-CM | POA: Diagnosis not present

## 2024-03-13 ENCOUNTER — Ambulatory Visit: Payer: Self-pay | Admitting: Emergency Medicine

## 2024-03-13 DIAGNOSIS — M7611 Psoas tendinitis, right hip: Secondary | ICD-10-CM | POA: Diagnosis not present

## 2024-03-13 DIAGNOSIS — G8929 Other chronic pain: Secondary | ICD-10-CM

## 2024-03-13 NOTE — H&P (Signed)
 TOTAL HIP REVISION ADMISSION H&P  Patient is admitted for right revision total hip arthroplasty.  Subjective:  Chief Complaint: right hip pain  HPI: Ashley [name], 48 y.o. female, has a history of pain and functional disability in the right hip due to chronic prosthetic joint infection and patient has failed non-surgical conservative treatments for greater than 12 weeks to include NSAID's and/or analgesics, supervised PT with diminished ADL's post treatment, use of assistive devices, and activity modification. The indications for the revision total hip arthroplasty are history of total hip infection.  Onset of symptoms was gradual starting 3 years ago with gradually worsening course since that time.  Prior procedures on the right hip include arthroplasty.  Patient currently rates pain in the right hip at 10 out of 10 with activity.  There is night pain, worsening of pain with activity and weight bearing, trendelenberg gait, pain that interfers with activities of daily living, and pain with passive range of motion. Patient has evidence of peri-prosthetic inflammation and fluid collection, with likely loosening by imaging studies.  This condition presents safety issues increasing the risk of falls.   There is no current active infection.  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   PCOS (polycystic ovarian syndrome) 04/02/2021   Depressive disorder 04/02/2021   Generalized OA 04/02/2021   Postoperative  pain 04/02/2021   Acute blood loss anemia 04/02/2021   Neuropathic pain 04/02/2021   Therapeutic opioid induced constipation 04/02/2021   Urinary retention 04/02/2021   Fall 04/02/2021   Labile blood pressure 04/02/2021   Pain 04/02/2021   Generalized anxiety disorder 04/02/2021   Obesity 04/02/2021   Body mass index (BMI) 40.0-44.9, adult (HCC) 12/31/2020   Flat foot 05/07/2019   Abnormality of gait 02/22/2019   Herpes simplex 01/18/2019   Lumbosacral radiculopathy at L4 06/09/2018   Paresis of single lower extremity (HCC) 04/26/2018   Lumbar radiculopathy 02/21/2018   Status post surgery 02/17/2018   Hip pain 01/05/2018   Radiculopathy, lumbar region 08/10/2017   Lumbar spondylosis 08/10/2017   Herniated nucleus pulposus, lumbar 05/19/2016   Primary localized osteoarthritis of right hip 04/21/2016   Sinusitis 06/25/2015   Other specified cough 06/25/2015   Hypertensive disorder 04/03/2015   Dehydration 04/03/2015   CAP (community acquired pneumonia) 03/31/2015   Right lower lobe pneumonia 03/31/2015   Fever 03/31/2015   Pregnancy 04/29/2014   Miscarriage    Incomplete miscarriage 04/02/2011   Deep venous thrombosis (HCC) 09/20/2010   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   Complication of anesthesia    Depression    Depression    History of pneumonia    Hypertension    Infertility, female    Joint pain    Lumbar spondylosis    Osteoarthritis    PCOS (polycystic ovarian syndrome)    Pneumonia    2016  PONV (postoperative nausea and vomiting)    Swelling of lower extremity     Past Surgical History:  Procedure Laterality Date   ABDOMINAL EXPOSURE N/A 02/17/2018   Procedure: ABDOMINAL EXPOSURE;  Surgeon: Oris Krystal FALCON, MD;  Location: [facility] OR;  Service: Vascular;  Laterality: N/A;   ANKLE SURGERY     x 2   ANTERIOR LAT LUMBAR FUSION N/A 02/17/2018    Procedure: Lumbar three-four Lumbar four-five  Anterolateral lumbar interbody fusion;  Surgeon: Colon Shove, MD;  Location: [facility] OR;  Service: Neurosurgery;  Laterality: N/A;   ANTERIOR LUMBAR FUSION N/A 02/17/2018   Procedure: Lumbar five-Sacral one Anterior lumbar interbody fusion;  Surgeon: Colon Shove, MD;  Location: [facility] OR;  Service: Neurosurgery;  Laterality: N/A;   APPLICATION OF ROBOTIC ASSISTANCE FOR SPINAL PROCEDURE N/A 02/17/2018   Procedure: APPLICATION OF ROBOTIC ASSISTANCE FOR SPINAL PROCEDURE;  Surgeon: Colon Shove, MD;  Location: [facility] OR;  Service: Neurosurgery;  Laterality: N/A;   APPLICATION OF ROBOTIC ASSISTANCE FOR SPINAL PROCEDURE N/A 06/09/2018   Procedure: APPLICATION OF ROBOTIC ASSISTANCE FOR SPINAL PROCEDURE;  Surgeon: Colon Shove, MD;  Location: [facility] OR;  Service: Neurosurgery;  Laterality: N/A;   BACK SURGERY     2017  discectomy   CARPAL TUNNEL RELEASE Left 07/04/2014   Procedure: LEFT CARPAL TUNNEL RELEASE;  Surgeon: Toribio FALCON Chancy, MD;  Location: [facility];  Service: Orthopedics;  Laterality: Left;   CARPAL TUNNEL RELEASE Right 08/08/2014   Procedure: RIGHT CARPAL TUNNEL RELEASE;  Surgeon: Toribio FALCON Chancy, MD;  Location: [facility];  Service: Orthopedics;  Laterality: Right;   DILATION AND CURETTAGE OF UTERUS     DILATION AND EVACUATION  04/02/2011   Procedure: DILATATION AND EVACUATION (D&E);  Surgeon: Alm JAYSON Cook, MD;  Location: [facility];  Service: Gynecology;  Laterality: N/A;  dvt left mid thigh   DORSAL COMPARTMENT RELEASE Left 07/04/2014   Procedure: LEFT DEQUERVAINS;  Surgeon: Toribio FALCON Chancy, MD;  Location: [facility];  Service: Orthopedics;  Laterality: Left;   ENDOSCOPIC PLANTAR FASCIOTOMY Left 02/01/2002   HARDWARE REMOVAL Right 06/09/2018   Procedure: Repositioning of Right Lumbar three Right lumbar  four pedicle screw with MAZOR;  Surgeon: Colon Shove, MD;  Location[facility] OR;  Service: Neurosurgery;  Laterality:  Right;   HYSTEROSCOPY WITH D & C  07/17/2010   with exc. endometrial polyps   KNEE ARTHROSCOPY Right 03/07/2003; 01/14/2005; 08/31/2006   KNEE ARTHROSCOPY Left 03/21/2003; 11/26/2004   LUMBAR PERCUTANEOUS PEDICLE SCREW 3 LEVEL N/A 02/17/2018   Procedure: LUMBAR PERCUTANEOUS PEDICLE SCREW PLACEMENT LUMBAR THREE-SACRAL ONE;  Surgeon: Colon Shove, MD;  Location: [facility] OR;  Service: Neurosurgery;  Laterality: N/A;   SHOULDER ARTHROSCOPY Right    TOE SURGERY Left 01/10/2003   claw toe correction 2nd, 3rd, 4th toes   TOTAL HIP ARTHROPLASTY Right 04/21/2016   TOTAL HIP ARTHROPLASTY Right 04/21/2016   Procedure: TOTAL HIP ARTHROPLASTY ANTERIOR APPROACH;  Surgeon: Toribio FALCON Chancy, MD;  Location: [facility] OR;  Service: Orthopedics;  Laterality: Right;   TOTAL KNEE ARTHROPLASTY Left 01/21/2010   TOTAL KNEE ARTHROPLASTY Right 08/26/2008   UPPER GI ENDOSCOPY N/A 04/14/2021   Procedure: UPPER GI ENDOSCOPY;  Surgeon: Gladis Cough, MD;  Location: [facility];  Service: General;  Laterality: N/A;    Current Outpatient Medications  Medication Sig Dispense Refill Last Dose/Taking   Albuterol  Sulfate (PROAIR  RESPICLICK) 108 (90 Base) MCG/ACT AEPB Inhale 1 puff into the lungs every 4 (four) hours as needed. 1 each 0  amLODipine  (NORVASC ) 5 MG tablet       baclofen  (LIORESAL ) 10 MG tablet Take 1 tablet by mouth twice a day 180 tablet 0    baclofen  (LIORESAL ) 10 MG tablet Take 1 tablet by mouth twice a day 180 tablet 0    Boric Acid 600 MG SUPP AS NEEDED      buPROPion  (WELLBUTRIN  XL) 300 MG 24 hr tablet Take 300 mg by mouth daily.      docusate sodium  (COLACE) 100 MG capsule       EPINEPHrine  0.3 mg/0.3 mL IJ SOAJ injection Inject 3 mLs into the muscle once.  0    fluticasone  (FLONASE ) 50 MCG/ACT nasal spray Place 1 spray into both nostrils daily.      furosemide  (LASIX ) 20 MG tablet Take 20 mg by mouth daily as needed for edema.      levocetirizine (XYZAL ALLERGY 24HR) 5 MG tablet       lidocaine  (LIDODERM ) 5 % Place 3 patches  onto the skin as directed. 270 patch 0    lidocaine  (LIDODERM ) 5 % Apply 3 patch to skin as directed as directed 270 patch 0    lisdexamfetamine (VYVANSE) 50 MG capsule Take 50 mg by mouth daily.      losartan -hydrochlorothiazide  (HYZAAR) 100-12.5 MG tablet Take 1 tablet by mouth daily.      Multiple Vitamin (MULTIVITAMIN) tablet Take 1 tablet by mouth daily. Bariatric vitamins      naproxen  (NAPROSYN ) 500 MG tablet Take 1 tablet (500 mg total) by mouth 2 (two) times daily with a meal. 20 tablet 0    oxyCODONE -acetaminophen  (PERCOCET) 10-325 MG tablet Take 1 tablet by mouth every 6 (six) hours as directed 120 tablet 0    [START ON 03/26/2024] oxyCODONE -acetaminophen  (PERCOCET) 10-325 MG tablet Take 1 tablet by mouth every six hours as directed 120 tablet 0    oxyCODONE -acetaminophen  (PERCOCET) 10-325 MG tablet Take 1 tablet by mouth every six hours as directed 120 tablet 0    pregabalin  (LYRICA ) 75 MG capsule Take 1 capsule (75 mg total) by mouth 3 (three) times daily. 270 capsule 0    pregabalin  (LYRICA ) 75 MG capsule Take 1 capsule by mouth three times a day 270 capsule 0    rosuvastatin  (CRESTOR ) 10 MG tablet Take 1 tablet (10 mg total) by mouth daily. 90 tablet 3    No current facility-administered medications for this visit.   Allergies  Allergen Reactions   Gadolinium Derivatives Nausea And Vomiting and Other (See Comments)    Pt describes this happens every time she gets gado even with slow injection  Other Reaction(s): Other (See Comments)   Codeine Itching, Nausea Only and Other (See Comments)    Hydrocodone  does not affect pt   Duloxetine     Other reaction(s): insomia  Other Reaction(s): Other (See Comments)  Other reaction(s): insomia  Altered mental status   Duloxetine Hcl Other (See Comments)    Altered mental status  Other Reaction(s): depression, other  Other Reaction(s): insomia, other  Cymbalta    Social History   Tobacco Use   Smoking status: Never    Smokeless tobacco: Never  Substance Use Topics   Alcohol use: No    Alcohol/week: 0.0 standard drinks of alcohol    Family History  Problem Relation Age of Onset   Diabetes Father    Cancer Father        COLON   Heart disease Father    Kidney disease Father  dialysis   High blood pressure Father    Heart disease Sister    Diabetes Sister    Heart failure Sister    Heart failure Sister    Heart disease Sister    Cancer Maternal Grandmother        UTERINE   Cancer Paternal Grandmother        breast   Breast cancer Paternal Grandmother    Breast cancer Cousin       Review of Systems  Musculoskeletal:  Positive for arthralgias.  All other systems reviewed and are negative.   Objective:  Physical Exam Constitutional:      General: She is not in acute distress.    Appearance: Normal appearance. She is not ill-appearing.  HENT:     Head: Normocephalic and atraumatic.     Right Ear: External ear normal.     Left Ear: External ear normal.     Nose: Nose normal.     Mouth/Throat:     Mouth: Mucous membranes are moist.     Pharynx: Oropharynx is clear.   Eyes:     Extraocular Movements: Extraocular movements intact.     Conjunctiva/sclera: Conjunctivae normal.    Cardiovascular:     Rate and Rhythm: Normal rate and regular rhythm.     Pulses: Normal pulses.     Heart sounds: Normal heart sounds.  Pulmonary:     Effort: Pulmonary effort is normal.     Breath sounds: Normal breath sounds.  Abdominal:     General: Bowel sounds are normal.     Palpations: Abdomen is soft.     Tenderness: There is no abdominal tenderness.   Musculoskeletal:        General: Tenderness present.     Cervical back: Normal range of motion and neck supple.     Comments: TTP over groin, lateral aspect, greater trochanter.  Mild IT band tenderness.  No significant swelling.  Well-healed incision of anterior hip, otherwise no overlying lesions of area of chief complaint.  Decreased  strength and ROM due to elicited pain.  Dorsiflexion and plantarflexion intact.  BLE appear grossly neurovascularly intact.  Gait mildly antalgic.    Skin:    General: Skin is warm and dry.   Neurological:     Mental Status: She is alert and oriented to person, place, and time. Mental status is at baseline.   Psychiatric:        Mood and Affect: Mood normal.        Behavior: Behavior normal.     Vital signs in last 24 hours: @VSRANGES @   Labs:   Estimated body mass index is 35.7 kg/m as calculated from the following:   Height as of 03/05/24: 5' 11 (1.803 m).   Weight as of 03/05/24: 116.1 kg.  Imaging Review:  MRI imaging demonstrates iliopsoas fluid collection and reactive lymphedema of the right hip(s). The bone quality appears to be fair to poor for age and reported activity level. There is no overt evidence of loosening on x-ray imaging.    Assessment/Plan:  Chronic prosthetic joint infection, right hip(s) with failed previous arthroplasty.  The patient history, physical examination, clinical judgement of the provider and imaging studies are consistent with chronic prosthetic joint infection of the right hip(s), previous total hip arthroplasty. Revision total hip arthroplasty is deemed medically necessary. The treatment options including medical management, injection therapy, arthroscopy and arthroplasty were discussed at length. The risks and benefits of total hip arthroplasty were presented and reviewed.  The risks due to aseptic loosening, infection, stiffness, dislocation/subluxation,  thromboembolic complications and other imponderables were discussed.  The patient acknowledged the explanation, agreed to proceed with the plan and consent was signed. Patient is being admitted for inpatient treatment for surgery, pain control, PT, OT, prophylactic antibiotics, VTE prophylaxis, progressive ambulation and ADL's and discharge planning. The patient is planning to be discharged  home with home health services ([facility]).

## 2024-03-13 NOTE — H&P (View-Only) (Signed)
 TOTAL HIP REVISION ADMISSION H&P  Patient is admitted for right revision total hip arthroplasty.  Subjective:  Chief Complaint: right hip pain  HPI: Ashley [name], 48 y.o. female, has a history of pain and functional disability in the right hip due to chronic prosthetic joint infection and patient has failed non-surgical conservative treatments for greater than 12 weeks to include NSAID's and/or analgesics, supervised PT with diminished ADL's post treatment, use of assistive devices, and activity modification. The indications for the revision total hip arthroplasty are history of total hip infection.  Onset of symptoms was gradual starting 3 years ago with gradually worsening course since that time.  Prior procedures on the right hip include arthroplasty.  Patient currently rates pain in the right hip at 10 out of 10 with activity.  There is night pain, worsening of pain with activity and weight bearing, trendelenberg gait, pain that interfers with activities of daily living, and pain with passive range of motion. Patient has evidence of peri-prosthetic inflammation and fluid collection, with likely loosening by imaging studies.  This condition presents safety issues increasing the risk of falls.   There is no current active infection.  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   PCOS (polycystic ovarian syndrome) 04/02/2021   Depressive disorder 04/02/2021   Generalized OA 04/02/2021   Postoperative  pain 04/02/2021   Acute blood loss anemia 04/02/2021   Neuropathic pain 04/02/2021   Therapeutic opioid induced constipation 04/02/2021   Urinary retention 04/02/2021   Fall 04/02/2021   Labile blood pressure 04/02/2021   Pain 04/02/2021   Generalized anxiety disorder 04/02/2021   Obesity 04/02/2021   Body mass index (BMI) 40.0-44.9, adult (HCC) 12/31/2020   Flat foot 05/07/2019   Abnormality of gait 02/22/2019   Herpes simplex 01/18/2019   Lumbosacral radiculopathy at L4 06/09/2018   Paresis of single lower extremity (HCC) 04/26/2018   Lumbar radiculopathy 02/21/2018   Status post surgery 02/17/2018   Hip pain 01/05/2018   Radiculopathy, lumbar region 08/10/2017   Lumbar spondylosis 08/10/2017   Herniated nucleus pulposus, lumbar 05/19/2016   Primary localized osteoarthritis of right hip 04/21/2016   Sinusitis 06/25/2015   Other specified cough 06/25/2015   Hypertensive disorder 04/03/2015   Dehydration 04/03/2015   CAP (community acquired pneumonia) 03/31/2015   Right lower lobe pneumonia 03/31/2015   Fever 03/31/2015   Pregnancy 04/29/2014   Miscarriage    Incomplete miscarriage 04/02/2011   Deep venous thrombosis (HCC) 09/20/2010   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   Complication of anesthesia    Depression    Depression    History of pneumonia    Hypertension    Infertility, female    Joint pain    Lumbar spondylosis    Osteoarthritis    PCOS (polycystic ovarian syndrome)    Pneumonia    2016  PONV (postoperative nausea and vomiting)    Swelling of lower extremity     Past Surgical History:  Procedure Laterality Date   ABDOMINAL EXPOSURE N/A 02/17/2018   Procedure: ABDOMINAL EXPOSURE;  Surgeon: Oris Krystal FALCON, MD;  Location: [facility] OR;  Service: Vascular;  Laterality: N/A;   ANKLE SURGERY     x 2   ANTERIOR LAT LUMBAR FUSION N/A 02/17/2018    Procedure: Lumbar three-four Lumbar four-five  Anterolateral lumbar interbody fusion;  Surgeon: Colon Shove, MD;  Location: [facility] OR;  Service: Neurosurgery;  Laterality: N/A;   ANTERIOR LUMBAR FUSION N/A 02/17/2018   Procedure: Lumbar five-Sacral one Anterior lumbar interbody fusion;  Surgeon: Colon Shove, MD;  Location: [facility] OR;  Service: Neurosurgery;  Laterality: N/A;   APPLICATION OF ROBOTIC ASSISTANCE FOR SPINAL PROCEDURE N/A 02/17/2018   Procedure: APPLICATION OF ROBOTIC ASSISTANCE FOR SPINAL PROCEDURE;  Surgeon: Colon Shove, MD;  Location: [facility] OR;  Service: Neurosurgery;  Laterality: N/A;   APPLICATION OF ROBOTIC ASSISTANCE FOR SPINAL PROCEDURE N/A 06/09/2018   Procedure: APPLICATION OF ROBOTIC ASSISTANCE FOR SPINAL PROCEDURE;  Surgeon: Colon Shove, MD;  Location: [facility] OR;  Service: Neurosurgery;  Laterality: N/A;   BACK SURGERY     2017  discectomy   CARPAL TUNNEL RELEASE Left 07/04/2014   Procedure: LEFT CARPAL TUNNEL RELEASE;  Surgeon: Toribio FALCON Chancy, MD;  Location: [facility];  Service: Orthopedics;  Laterality: Left;   CARPAL TUNNEL RELEASE Right 08/08/2014   Procedure: RIGHT CARPAL TUNNEL RELEASE;  Surgeon: Toribio FALCON Chancy, MD;  Location: [facility];  Service: Orthopedics;  Laterality: Right;   DILATION AND CURETTAGE OF UTERUS     DILATION AND EVACUATION  04/02/2011   Procedure: DILATATION AND EVACUATION (D&E);  Surgeon: Alm JAYSON Cook, MD;  Location: [facility];  Service: Gynecology;  Laterality: N/A;  dvt left mid thigh   DORSAL COMPARTMENT RELEASE Left 07/04/2014   Procedure: LEFT DEQUERVAINS;  Surgeon: Toribio FALCON Chancy, MD;  Location: [facility];  Service: Orthopedics;  Laterality: Left;   ENDOSCOPIC PLANTAR FASCIOTOMY Left 02/01/2002   HARDWARE REMOVAL Right 06/09/2018   Procedure: Repositioning of Right Lumbar three Right lumbar  four pedicle screw with MAZOR;  Surgeon: Colon Shove, MD;  Location[facility] OR;  Service: Neurosurgery;  Laterality:  Right;   HYSTEROSCOPY WITH D & C  07/17/2010   with exc. endometrial polyps   KNEE ARTHROSCOPY Right 03/07/2003; 01/14/2005; 08/31/2006   KNEE ARTHROSCOPY Left 03/21/2003; 11/26/2004   LUMBAR PERCUTANEOUS PEDICLE SCREW 3 LEVEL N/A 02/17/2018   Procedure: LUMBAR PERCUTANEOUS PEDICLE SCREW PLACEMENT LUMBAR THREE-SACRAL ONE;  Surgeon: Colon Shove, MD;  Location: [facility] OR;  Service: Neurosurgery;  Laterality: N/A;   SHOULDER ARTHROSCOPY Right    TOE SURGERY Left 01/10/2003   claw toe correction 2nd, 3rd, 4th toes   TOTAL HIP ARTHROPLASTY Right 04/21/2016   TOTAL HIP ARTHROPLASTY Right 04/21/2016   Procedure: TOTAL HIP ARTHROPLASTY ANTERIOR APPROACH;  Surgeon: Toribio FALCON Chancy, MD;  Location: [facility] OR;  Service: Orthopedics;  Laterality: Right;   TOTAL KNEE ARTHROPLASTY Left 01/21/2010   TOTAL KNEE ARTHROPLASTY Right 08/26/2008   UPPER GI ENDOSCOPY N/A 04/14/2021   Procedure: UPPER GI ENDOSCOPY;  Surgeon: Gladis Cough, MD;  Location: [facility];  Service: General;  Laterality: N/A;    Current Outpatient Medications  Medication Sig Dispense Refill Last Dose/Taking   Albuterol  Sulfate (PROAIR  RESPICLICK) 108 (90 Base) MCG/ACT AEPB Inhale 1 puff into the lungs every 4 (four) hours as needed. 1 each 0  amLODipine  (NORVASC ) 5 MG tablet       baclofen  (LIORESAL ) 10 MG tablet Take 1 tablet by mouth twice a day 180 tablet 0    baclofen  (LIORESAL ) 10 MG tablet Take 1 tablet by mouth twice a day 180 tablet 0    Boric Acid 600 MG SUPP AS NEEDED      buPROPion  (WELLBUTRIN  XL) 300 MG 24 hr tablet Take 300 mg by mouth daily.      docusate sodium  (COLACE) 100 MG capsule       EPINEPHrine  0.3 mg/0.3 mL IJ SOAJ injection Inject 3 mLs into the muscle once.  0    fluticasone  (FLONASE ) 50 MCG/ACT nasal spray Place 1 spray into both nostrils daily.      furosemide  (LASIX ) 20 MG tablet Take 20 mg by mouth daily as needed for edema.      levocetirizine (XYZAL ALLERGY 24HR) 5 MG tablet       lidocaine  (LIDODERM ) 5 % Place 3 patches  onto the skin as directed. 270 patch 0    lidocaine  (LIDODERM ) 5 % Apply 3 patch to skin as directed as directed 270 patch 0    lisdexamfetamine (VYVANSE) 50 MG capsule Take 50 mg by mouth daily.      losartan -hydrochlorothiazide  (HYZAAR) 100-12.5 MG tablet Take 1 tablet by mouth daily.      Multiple Vitamin (MULTIVITAMIN) tablet Take 1 tablet by mouth daily. Bariatric vitamins      naproxen  (NAPROSYN ) 500 MG tablet Take 1 tablet (500 mg total) by mouth 2 (two) times daily with a meal. 20 tablet 0    oxyCODONE -acetaminophen  (PERCOCET) 10-325 MG tablet Take 1 tablet by mouth every 6 (six) hours as directed 120 tablet 0    [START ON 03/26/2024] oxyCODONE -acetaminophen  (PERCOCET) 10-325 MG tablet Take 1 tablet by mouth every six hours as directed 120 tablet 0    oxyCODONE -acetaminophen  (PERCOCET) 10-325 MG tablet Take 1 tablet by mouth every six hours as directed 120 tablet 0    pregabalin  (LYRICA ) 75 MG capsule Take 1 capsule (75 mg total) by mouth 3 (three) times daily. 270 capsule 0    pregabalin  (LYRICA ) 75 MG capsule Take 1 capsule by mouth three times a day 270 capsule 0    rosuvastatin  (CRESTOR ) 10 MG tablet Take 1 tablet (10 mg total) by mouth daily. 90 tablet 3    No current facility-administered medications for this visit.   Allergies  Allergen Reactions   Gadolinium Derivatives Nausea And Vomiting and Other (See Comments)    Pt describes this happens every time she gets gado even with slow injection  Other Reaction(s): Other (See Comments)   Codeine Itching, Nausea Only and Other (See Comments)    Hydrocodone  does not affect pt   Duloxetine     Other reaction(s): insomia  Other Reaction(s): Other (See Comments)  Other reaction(s): insomia  Altered mental status   Duloxetine Hcl Other (See Comments)    Altered mental status  Other Reaction(s): depression, other  Other Reaction(s): insomia, other  Cymbalta    Social History   Tobacco Use   Smoking status: Never    Smokeless tobacco: Never  Substance Use Topics   Alcohol use: No    Alcohol/week: 0.0 standard drinks of alcohol    Family History  Problem Relation Age of Onset   Diabetes Father    Cancer Father        COLON   Heart disease Father    Kidney disease Father  dialysis   High blood pressure Father    Heart disease Sister    Diabetes Sister    Heart failure Sister    Heart failure Sister    Heart disease Sister    Cancer Maternal Grandmother        UTERINE   Cancer Paternal Grandmother        breast   Breast cancer Paternal Grandmother    Breast cancer Cousin       Review of Systems  Musculoskeletal:  Positive for arthralgias.  All other systems reviewed and are negative.   Objective:  Physical Exam Constitutional:      General: She is not in acute distress.    Appearance: Normal appearance. She is not ill-appearing.  HENT:     Head: Normocephalic and atraumatic.     Right Ear: External ear normal.     Left Ear: External ear normal.     Nose: Nose normal.     Mouth/Throat:     Mouth: Mucous membranes are moist.     Pharynx: Oropharynx is clear.   Eyes:     Extraocular Movements: Extraocular movements intact.     Conjunctiva/sclera: Conjunctivae normal.    Cardiovascular:     Rate and Rhythm: Normal rate and regular rhythm.     Pulses: Normal pulses.     Heart sounds: Normal heart sounds.  Pulmonary:     Effort: Pulmonary effort is normal.     Breath sounds: Normal breath sounds.  Abdominal:     General: Bowel sounds are normal.     Palpations: Abdomen is soft.     Tenderness: There is no abdominal tenderness.   Musculoskeletal:        General: Tenderness present.     Cervical back: Normal range of motion and neck supple.     Comments: TTP over groin, lateral aspect, greater trochanter.  Mild IT band tenderness.  No significant swelling.  Well-healed incision of anterior hip, otherwise no overlying lesions of area of chief complaint.  Decreased  strength and ROM due to elicited pain.  Dorsiflexion and plantarflexion intact.  BLE appear grossly neurovascularly intact.  Gait mildly antalgic.    Skin:    General: Skin is warm and dry.   Neurological:     Mental Status: She is alert and oriented to person, place, and time. Mental status is at baseline.   Psychiatric:        Mood and Affect: Mood normal.        Behavior: Behavior normal.     Vital signs in last 24 hours: @VSRANGES @   Labs:   Estimated body mass index is 35.7 kg/m as calculated from the following:   Height as of 03/05/24: 5' 11 (1.803 m).   Weight as of 03/05/24: 116.1 kg.  Imaging Review:  MRI imaging demonstrates iliopsoas fluid collection and reactive lymphedema of the right hip(s). The bone quality appears to be fair to poor for age and reported activity level. There is no overt evidence of loosening on x-ray imaging.    Assessment/Plan:  Chronic prosthetic joint infection, right hip(s) with failed previous arthroplasty.  The patient history, physical examination, clinical judgement of the provider and imaging studies are consistent with chronic prosthetic joint infection of the right hip(s), previous total hip arthroplasty. Revision total hip arthroplasty is deemed medically necessary. The treatment options including medical management, injection therapy, arthroscopy and arthroplasty were discussed at length. The risks and benefits of total hip arthroplasty were presented and reviewed.  The risks due to aseptic loosening, infection, stiffness, dislocation/subluxation,  thromboembolic complications and other imponderables were discussed.  The patient acknowledged the explanation, agreed to proceed with the plan and consent was signed. Patient is being admitted for inpatient treatment for surgery, pain control, PT, OT, prophylactic antibiotics, VTE prophylaxis, progressive ambulation and ADL's and discharge planning. The patient is planning to be discharged  home with home health services ([facility]).

## 2024-03-14 DIAGNOSIS — J3089 Other allergic rhinitis: Secondary | ICD-10-CM | POA: Diagnosis not present

## 2024-03-14 DIAGNOSIS — J301 Allergic rhinitis due to pollen: Secondary | ICD-10-CM | POA: Diagnosis not present

## 2024-03-14 DIAGNOSIS — J3081 Allergic rhinitis due to animal (cat) (dog) hair and dander: Secondary | ICD-10-CM | POA: Diagnosis not present

## 2024-03-15 NOTE — Progress Notes (Signed)
 Sent message, via epic in basket, requesting orders in epic from Careers adviser.

## 2024-03-16 ENCOUNTER — Other Ambulatory Visit (HOSPITAL_COMMUNITY): Payer: Self-pay

## 2024-03-18 DIAGNOSIS — I10 Essential (primary) hypertension: Secondary | ICD-10-CM | POA: Diagnosis not present

## 2024-03-19 DIAGNOSIS — E7800 Pure hypercholesterolemia, unspecified: Secondary | ICD-10-CM | POA: Diagnosis not present

## 2024-03-19 DIAGNOSIS — I10 Essential (primary) hypertension: Secondary | ICD-10-CM | POA: Diagnosis not present

## 2024-03-20 DIAGNOSIS — J3089 Other allergic rhinitis: Secondary | ICD-10-CM | POA: Diagnosis not present

## 2024-03-20 DIAGNOSIS — J3081 Allergic rhinitis due to animal (cat) (dog) hair and dander: Secondary | ICD-10-CM | POA: Diagnosis not present

## 2024-03-20 DIAGNOSIS — J301 Allergic rhinitis due to pollen: Secondary | ICD-10-CM | POA: Diagnosis not present

## 2024-03-21 ENCOUNTER — Ambulatory Visit: Payer: Self-pay | Admitting: Emergency Medicine

## 2024-03-21 NOTE — Progress Notes (Signed)
 COVID Vaccine received:  []  No [x]  Yes Date of any COVID positive Test in last 90 days:  none  PCP - Worth Moloney PA at [facility] [phone]  Cardiologist - Madonna Large, DO, Lum Louis, NP  cardiac clearance in 02-27-2024 Epic note Pain Mgmt- [facility], Dr Iona Beard  Chest x-ray - 12-14-2018  2v Epic EKG -  10-12-2023  Epic Stress Test - 05-20-2023  Epic ECHO - 04-12-2023  Epic Cardiac Cath -  CT Coronary Calcium  score: 80.2 on 03-30-2023  Epic  Pacemaker / ICD device [x]  No []  Yes   Spinal Cord Stimulator:[x]  No []  Yes       History of Sleep Apnea? []  No [x]  Yes   CPAP used?- [x]  No []  Yes    Does the patient monitor blood sugar?   []  N/A   [x]  No []  Yes  Patient has: []  NO Hx DM   [x]  Pre-DM   []  DM1  []   DM2 Last A1c was: 5.5  on   11-03-2023   no Meds    Blood Thinner / Instructions:  none Aspirin  Instructions:   none  ERAS Protocol Ordered: []  No  [x]  Yes PRE-SURGERY []  ENSURE  [x]  G2  Patient is to be NPO after: 0800  Dental hx: []  Dentures:  [x]  N/A      []  Bridge or Partial:                   []  Loose or Damaged teeth:   Activity level: Able to walk up 2 flights of stairs without becoming significantly short of breath or having chest pain?  []  No   [x]    Yes   severe leg pain  Anesthesia review: CAD, tachycardia, HTN, Pre-DM, Hx DVT - LLE 2011, GAD, ADHD, PONV, CPS- Failed back / hip, s/p gastric sleeve  Patient denies any S&S of respiratory illness or Covid - no shortness of breath, fever, cough or chest pain at PAT appointment.  Patient verbalized understanding and agreement to the Pre-Surgical Instructions that were given to them at this PAT appointment. Patient was also educated of the need to review these PAT instructions again prior to her surgery.I reviewed the appropriate phone numbers to call if they have any and questions or concerns.

## 2024-03-21 NOTE — Patient Instructions (Signed)
 SURGICAL WAITING ROOM VISITATION Patients having surgery or a procedure may have no more than 2 support people in the waiting area - these visitors may rotate in the visitor waiting room.   If the patient needs to stay at the hospital during part of their recovery, the visitor guidelines for inpatient rooms apply.  PRE-OP VISITATION  Pre-op nurse will coordinate an appropriate time for 1 support person to accompany the patient in pre-op.  This support person may not rotate.  This visitor will be contacted when the time is appropriate for the visitor to come back in the pre-op area.  Please refer to the [facility] website for the visitor guidelines for Inpatients (after your surgery is over and you are in a regular room).  You are not required to quarantine at this time prior to your surgery. However, you must do this: Hand Hygiene often Do NOT share personal items Notify your provider if you are in close contact with someone who has COVID or you develop fever 100.4 or greater, new onset of sneezing, cough, sore throat, shortness of breath or body aches.  If you test positive for Covid or have been in contact with anyone that has tested positive in the last 10 days please notify you surgeon.    Your procedure is scheduled on:  [facility]  April 04, 2024  Report to [facility] Main Entrance: [place] entrance where the [employer] is available.   Report to admitting at:  08:30   AM  Call this number if you have any questions or problems the morning of surgery [phone]  Do not eat food after Midnight the night prior to your surgery/procedure.  After Midnight you may have the following liquids until   08:00 AM DAY OF SURGERY  Clear Liquid Diet Water Black Coffee (sugar ok, NO MILK/CREAM OR CREAMERS)  Tea (sugar ok, NO MILK/CREAM OR CREAMERS) regular and decaf                             Plain Jell-O  with no fruit (NO RED)                                           Fruit  ices (not with fruit pulp, NO RED)                                     Popsicles (NO RED)                                                                  Juice: NO CITRUS JUICES: only apple, WHITE grape, WHITE cranberry Sports drinks like Gatorade or Powerade (NO RED)                   The day of surgery:  Drink ONE (1) Pre-Surgery G2 at   08:00 AM the morning of surgery. Drink in one sitting. Do not sip.  This drink was given to you during your hospital pre-op appointment visit. Nothing else to drink after  completing the Pre-Surgery G2 : No candy, chewing gum or throat lozenges.    FOLLOW ANY ADDITIONAL PRE OP INSTRUCTIONS YOU RECEIVED FROM YOUR SURGEON'S OFFICE!!!   Oral Hygiene is also important to reduce your risk of infection.        Remember - BRUSH YOUR TEETH THE MORNING OF SURGERY WITH YOUR REGULAR TOOTHPASTE  Do NOT smoke after Midnight the night before surgery.  STOP TAKING all Vitamins, Herbs and supplements 1 week before your surgery.   Take ONLY these medicines the morning of surgery with A SIP OF WATER: carvedilol, bupropion , pregabalin  and Oxycodone  APAP if needed for pain.  You may use your Albuterol  inhaler if needed. Please bring this inhaler with you on the day of surgery.                   You may not have any metal on your body including hair pins, jewelry, and body piercing  Do not wear make-up, lotions, powders, perfumes or deodorant  Do not wear nail polish including gel and S&S, artificial / acrylic nails, or any other type of covering on natural nails including finger and toenails. If you have artificial nails, gel coating, etc., that needs to be removed by a nail salon, Please have this removed prior to surgery. Not doing so may mean that your surgery could be cancelled or delayed if the Surgeon or anesthesia staff feels like they are unable to monitor you safely.   Do not shave 48 hours prior to surgery to avoid nicks in your skin which may contribute to  postoperative infections.   Contacts, Hearing Aids, dentures or bridgework may not be worn into surgery. DENTURES WILL BE REMOVED PRIOR TO SURGERY PLEASE DO NOT APPLY Poly grip OR ADHESIVES!!!  You may bring a small overnight bag with you on the day of surgery, only pack items that are not valuable. [facility] IS NOT RESPONSIBLE   FOR VALUABLES THAT ARE LOST OR STOLEN.   Do not bring your home medications to the hospital. The Pharmacy will dispense medications listed on your medication list to you during your admission in the Hospital.  Please read over the following fact sheets you were given: IF YOU HAVE QUESTIONS ABOUT YOUR PRE-OP INSTRUCTIONS, PLEASE CALL [phone].     Pre-operative 5 CHG Bath Instructions   You can play a key role in reducing the risk of infection after surgery. Your skin needs to be as free of germs as possible. You can reduce the number of germs on your skin by washing with CHG (chlorhexidine  gluconate) soap before surgery. CHG is an antiseptic soap that kills germs and continues to kill germs even after washing.   DO NOT use if you have an allergy to chlorhexidine /CHG or antibacterial soaps. If your skin becomes reddened or irritated, stop using the CHG and notify one of our RNs at [phone]  Please shower with the CHG soap starting 4 days before surgery using the following schedule: START SHOWERS ON  SATURDAY  March 31, 2024  Please keep in mind the following:  DO NOT shave, including legs and underarms, starting the day of your first shower.   You may shave your face at any point before/day of surgery.   Place clean sheets on your bed the day you start using CHG soap. Use a clean washcloth (not used since being washed) for each shower. DO NOT sleep with pets once you start using the  CHG.   CHG Shower Instructions:  If you choose to wash your hair and private area, wash first with your normal shampoo/soap.  After you use shampoo/soap, rinse your hair and body thoroughly to remove shampoo/soap residue.  Turn the water OFF and apply about 3 tablespoons (45 ml) of CHG soap to a CLEAN washcloth.  Apply CHG soap ONLY FROM YOUR NECK DOWN TO YOUR TOES (washing for 3-5 minutes)  DO NOT use CHG soap on face, private areas, open wounds, or sores.  Pay special attention to the area where your surgery is being performed.  If you are having back surgery, having someone wash your back for you may be helpful.  Wait 2 minutes after CHG soap is applied, then you may rinse off the CHG soap.  Pat dry with a clean towel  Put on clean clothes/pajamas   If you choose to wear lotion, please use ONLY the CHG-compatible lotions on the back of this paper.     Additional instructions for the day of surgery: DO NOT APPLY any lotions, deodorants, cologne, or perfumes.   Put on clean/comfortable clothes.  Brush your teeth.  Ask your nurse before applying any prescription medications to the skin.      CHG Compatible Lotions   Aveeno Moisturizing lotion  Cetaphil Moisturizing Cream  Cetaphil Moisturizing Lotion  Clairol Herbal Essence Moisturizing Lotion, Dry Skin  Clairol Herbal Essence Moisturizing Lotion, Extra Dry Skin  Clairol Herbal Essence Moisturizing Lotion, Normal Skin  Curel Age Defying Therapeutic Moisturizing Lotion with Alpha Hydroxy  Curel Extreme Care Body Lotion  Curel Soothing Hands Moisturizing Hand Lotion  Curel Therapeutic Moisturizing Cream, Fragrance-Free  Curel Therapeutic Moisturizing Lotion, Fragrance-Free  Curel Therapeutic Moisturizing Lotion, Original Formula  Eucerin Daily Replenishing Lotion  Eucerin Dry Skin Therapy Plus Alpha Hydroxy Crme  Eucerin Dry Skin Therapy Plus Alpha Hydroxy Lotion  Eucerin Original Crme  Eucerin Original Lotion  Eucerin  Plus Crme Eucerin Plus Lotion  Eucerin TriLipid Replenishing Lotion  Keri Anti-Bacterial Hand Lotion  Keri Deep Conditioning Original Lotion Dry Skin Formula Softly Scented  Keri Deep Conditioning Original Lotion, Fragrance Free Sensitive Skin Formula  Keri Lotion Fast Absorbing Fragrance Free Sensitive Skin Formula  Keri Lotion Fast Absorbing Softly Scented Dry Skin Formula  Keri Original Lotion  Keri Skin Renewal Lotion Keri Silky Smooth Lotion  Keri Silky Smooth Sensitive Skin Lotion  Nivea Body Creamy Conditioning Oil  Nivea Body Extra Enriched Lotion  Nivea Body Original Lotion  Nivea Body Sheer Moisturizing Lotion Nivea Crme  Nivea Skin Firming Lotion  NutraDerm 30 Skin Lotion  NutraDerm Skin Lotion  NutraDerm Therapeutic Skin Cream  NutraDerm Therapeutic Skin Lotion  ProShield Protective Hand Cream  Provon moisturizing lotion   FAILURE TO FOLLOW THESE INSTRUCTIONS MAY RESULT IN THE CANCELLATION OF YOUR SURGERY  PATIENT SIGNATURE_________________________________  NURSE SIGNATURE__________________________________  ________________________________________________________________________         Ashley [name]    An incentive spirometer is a tool that can help keep your lungs clear and active. This tool measures how well you are filling your lungs with each  breath. Taking long deep breaths may help reverse or decrease the chance of developing breathing (pulmonary) problems (especially infection) following: A long period of time when you are unable to move or be active. BEFORE THE PROCEDURE  If the spirometer includes an indicator to show your best effort, your nurse or respiratory therapist will set it to a desired goal. If possible, sit up straight or lean slightly forward. Try not to slouch. Hold the incentive spirometer in an upright position. INSTRUCTIONS FOR USE  Sit on the edge of your bed if possible, or sit up as far as you can in bed or on a  chair. Hold the incentive spirometer in an upright position. Breathe out normally. Place the mouthpiece in your mouth and seal your lips tightly around it. Breathe in slowly and as deeply as possible, raising the piston or the ball toward the top of the column. Hold your breath for 3-5 seconds or for as long as possible. Allow the piston or ball to fall to the bottom of the column. Remove the mouthpiece from your mouth and breathe out normally. Rest for a few seconds and repeat Steps 1 through 7 at least 10 times every 1-2 hours when you are awake. Take your time and take a few normal breaths between deep breaths. The spirometer may include an indicator to show your best effort. Use the indicator as a goal to work toward during each repetition. After each set of 10 deep breaths, practice coughing to be sure your lungs are clear. If you have an incision (the cut made at the time of surgery), support your incision when coughing by placing a pillow or rolled up towels firmly against it. Once you are able to get out of bed, walk around indoors and cough well. You may stop using the incentive spirometer when instructed by your caregiver.  RISKS AND COMPLICATIONS Take your time so you do not get dizzy or light-headed. If you are in pain, you may need to take or ask for pain medication before doing incentive spirometry. It is harder to take a deep breath if you are having pain. AFTER USE Rest and breathe slowly and easily. It can be helpful to keep track of a log of your progress. Your caregiver can provide you with a simple table to help with this. If you are using the spirometer at home, follow these instructions: SEEK MEDICAL CARE IF:  You are having difficultly using the spirometer. You have trouble using the spirometer as often as instructed. Your pain medication is not giving enough relief while using the spirometer. You develop fever of 100.5 F (38.1 C) or higher.                                                                                                     SEEK IMMEDIATE MEDICAL CARE IF:  You cough up bloody sputum that had not been present before. You develop fever of 102 F (38.9 C) or greater. You develop worsening pain at or near the incision site. MAKE SURE YOU:  Understand these instructions. Will watch  your condition. Will get help right away if you are not doing well or get worse. Document Released: 01/17/2007 Document Revised: 11/29/2011 Document Reviewed: 03/20/2007 [facility] Patient Information 2014 [place], [place].        WHAT IS A BLOOD TRANSFUSION? Blood Transfusion Information  A transfusion is the replacement of blood or some of its parts. Blood is made up of multiple cells which provide different functions. Red blood cells carry oxygen and are used for blood loss replacement. White blood cells fight against infection. Platelets control bleeding. Plasma helps clot blood. Other blood products are available for specialized needs, such as hemophilia or other clotting disorders. BEFORE THE TRANSFUSION  Who gives blood for transfusions?  Healthy volunteers who are fully evaluated to make sure their blood is safe. This is blood bank blood. Transfusion therapy is the safest it has ever been in the practice of medicine. Before blood is taken from a donor, a complete history is taken to make sure that person has no history of diseases nor engages in risky social behavior (examples are intravenous drug use or sexual activity with multiple partners). The donor's travel history is screened to minimize risk of transmitting infections, such as malaria. The donated blood is tested for signs of infectious diseases, such as HIV and hepatitis. The blood is then tested to be sure it is compatible with you in order to minimize the chance of a transfusion reaction. If you or a relative donates blood, this is often done in anticipation of surgery and is not appropriate for  emergency situations. It takes many days to process the donated blood. RISKS AND COMPLICATIONS Although transfusion therapy is very safe and saves many lives, the main dangers of transfusion include:  Getting an infectious disease. Developing a transfusion reaction. This is an allergic reaction to something in the blood you were given. Every precaution is taken to prevent this. The decision to have a blood transfusion has been considered carefully by your caregiver before blood is given. Blood is not given unless the benefits outweigh the risks. AFTER THE TRANSFUSION Right after receiving a blood transfusion, you will usually feel much better and more energetic. This is especially true if your red blood cells have gotten low (anemic). The transfusion raises the level of the red blood cells which carry oxygen, and this usually causes an energy increase. The nurse administering the transfusion will monitor you carefully for complications. HOME CARE INSTRUCTIONS  No special instructions are needed after a transfusion. You may find your energy is better. Speak with your caregiver about any limitations on activity for underlying diseases you may have. SEEK MEDICAL CARE IF:  Your condition is not improving after your transfusion. You develop redness or irritation at the intravenous (IV) site. SEEK IMMEDIATE MEDICAL CARE IF:  Any of the following symptoms occur over the next 12 hours: Shaking chills. You have a temperature by mouth above 102 F (38.9 C), not controlled by medicine. Chest, back, or muscle pain. People around you feel you are not acting correctly or are confused. Shortness of breath or difficulty breathing. Dizziness and fainting. You get a rash or develop hives. You have a decrease in urine output. Your urine turns a dark color or changes to pink, red, or brown. Any of the following symptoms occur over the next 10 days: You have a temperature by mouth above 102 F (38.9 C), not  controlled by medicine. Shortness of breath. Weakness after normal activity. The white part of the eye turns yellow (  jaundice). You have a decrease in the amount of urine or are urinating less often. Your urine turns a dark color or changes to pink, red, or brown. Document Released: 09/03/2000 Document Revised: 11/29/2011 Document Reviewed: 04/22/2008 [facility] Patient Information 2014 [place], [place].  _______________________________________________________________________

## 2024-03-22 ENCOUNTER — Encounter (HOSPITAL_COMMUNITY)
Admission: RE | Admit: 2024-03-22 | Discharge: 2024-03-22 | Disposition: A | Discharge status: Home / Self Care | Source: Ambulatory Visit | Attending: Orthopedic Surgery | Admitting: Orthopedic Surgery

## 2024-03-22 ENCOUNTER — Other Ambulatory Visit: Payer: Self-pay

## 2024-03-22 ENCOUNTER — Encounter (HOSPITAL_COMMUNITY): Payer: Self-pay

## 2024-03-22 VITALS — BP 127/84 | HR 75 | Temp 98.7°F | Resp 20 | Ht 71.0 in | Wt 258.0 lb

## 2024-03-22 DIAGNOSIS — Y798 Miscellaneous orthopedic devices associated with adverse incidents, not elsewhere classified: Secondary | ICD-10-CM | POA: Insufficient documentation

## 2024-03-22 DIAGNOSIS — I10 Essential (primary) hypertension: Secondary | ICD-10-CM | POA: Insufficient documentation

## 2024-03-22 DIAGNOSIS — Z9884 Bariatric surgery status: Secondary | ICD-10-CM | POA: Diagnosis not present

## 2024-03-22 DIAGNOSIS — Y839 Surgical procedure, unspecified as the cause of abnormal reaction of the patient, or of later complication, without mention of misadventure at the time of the procedure: Secondary | ICD-10-CM | POA: Diagnosis not present

## 2024-03-22 DIAGNOSIS — Z01812 Encounter for preprocedural laboratory examination: Secondary | ICD-10-CM | POA: Diagnosis not present

## 2024-03-22 DIAGNOSIS — G8929 Other chronic pain: Secondary | ICD-10-CM | POA: Diagnosis not present

## 2024-03-22 DIAGNOSIS — T8451XA Infection and inflammatory reaction due to internal right hip prosthesis, initial encounter: Secondary | ICD-10-CM | POA: Insufficient documentation

## 2024-03-22 DIAGNOSIS — Z86718 Personal history of other venous thrombosis and embolism: Secondary | ICD-10-CM | POA: Diagnosis not present

## 2024-03-22 DIAGNOSIS — Z01818 Encounter for other preprocedural examination: Secondary | ICD-10-CM

## 2024-03-22 DIAGNOSIS — I2510 Atherosclerotic heart disease of native coronary artery without angina pectoris: Secondary | ICD-10-CM

## 2024-03-22 DIAGNOSIS — R7303 Prediabetes: Secondary | ICD-10-CM

## 2024-03-22 DIAGNOSIS — M25551 Pain in right hip: Secondary | ICD-10-CM | POA: Insufficient documentation

## 2024-03-22 DIAGNOSIS — Z79891 Long term (current) use of opiate analgesic: Secondary | ICD-10-CM

## 2024-03-22 HISTORY — DX: Prediabetes: R73.03

## 2024-03-22 HISTORY — DX: Attention-deficit hyperactivity disorder, unspecified type: F90.9

## 2024-03-22 HISTORY — DX: Myoneural disorder, unspecified: G70.9

## 2024-03-22 LAB — CBC WITH DIFFERENTIAL/PLATELET
Abs Immature Granulocytes: 0.02 10*3/uL (ref 0.00–0.07)
Basophils Absolute: 0.1 10*3/uL (ref 0.0–0.1)
Basophils Relative: 1 %
Eosinophils Absolute: 0.1 10*3/uL (ref 0.0–0.5)
Eosinophils Relative: 2 %
HCT: 37.5 % (ref 36.0–46.0)
Hemoglobin: 11.5 g/dL — ABNORMAL LOW (ref 12.0–15.0)
Immature Granulocytes: 0 %
Lymphocytes Relative: 23 %
Lymphs Abs: 1.8 10*3/uL (ref 0.7–4.0)
MCH: 25.7 pg — ABNORMAL LOW (ref 26.0–34.0)
MCHC: 30.7 g/dL (ref 30.0–36.0)
MCV: 83.9 fL (ref 80.0–100.0)
Monocytes Absolute: 0.8 10*3/uL (ref 0.1–1.0)
Monocytes Relative: 10 %
Neutro Abs: 5.2 10*3/uL (ref 1.7–7.7)
Neutrophils Relative %: 64 %
Platelets: 201 10*3/uL (ref 150–400)
RBC: 4.47 MIL/uL (ref 3.87–5.11)
RDW: 17.2 % — ABNORMAL HIGH (ref 11.5–15.5)
WBC: 8 10*3/uL (ref 4.0–10.5)
nRBC: 0 % (ref 0.0–0.2)

## 2024-03-22 LAB — COMPREHENSIVE METABOLIC PANEL WITH GFR
ALT: 21 U/L (ref 0–44)
AST: 21 U/L (ref 15–41)
Albumin: 3.5 g/dL (ref 3.5–5.0)
Alkaline Phosphatase: 72 U/L (ref 38–126)
Anion gap: 8 (ref 5–15)
BUN: 13 mg/dL (ref 6–20)
CO2: 27 mmol/L (ref 22–32)
Calcium: 8.6 mg/dL — ABNORMAL LOW (ref 8.9–10.3)
Chloride: 103 mmol/L (ref 98–111)
Creatinine, Ser: 0.97 mg/dL (ref 0.44–1.00)
GFR, Estimated: >60 mL/min (ref >60)
Glucose, Bld: 83 mg/dL (ref 70–99)
Potassium: 3.9 mmol/L (ref 3.5–5.1)
Sodium: 138 mmol/L (ref 135–145)
Total Bilirubin: 1 mg/dL (ref 0.0–1.2)
Total Protein: 7.7 g/dL (ref 6.5–8.1)

## 2024-03-22 LAB — TYPE AND SCREEN
ABO/RH(D): B POS
Antibody Screen: NEGATIVE

## 2024-03-22 LAB — SURGICAL PCR SCREEN
MRSA, PCR: NEGATIVE
Staphylococcus aureus: NEGATIVE

## 2024-03-26 NOTE — Progress Notes (Signed)
 Anesthesia Chart Review   Case: [id number] Date/Time: 04/04/24 1045   Procedures:      REVISION, ARTHROPLASTY, HIP (Right: Hip)     APPLICATION, WOUND VAC (Right)   Anesthesia type: Spinal   Pre-op diagnosis: INFECTED RIGHT HIP PROSTHESIS   Location: [facility] ROOM 08 / [facility]   Surgeons: Edna Toribio LABOR, MD       DISCUSSION:48 y.o. never smoker with h/o PONV, HTN, s/p gastric sleeve, DVT, infected right hip prosthesis scheduled for above procedure 04/04/2024 with Dr. Toribio Edna.   Per cardiology preoperative evaluation 02/27/2024, According to the Revised Cardiac Risk Index (RCRI), her Perioperative Risk of Major Cardiac Event is (%): 0.4. Her Functional Capacity in METs is: 6.27 according to the Duke Activity Status Index (DASI). Therefore, based on ACC/AHA guidelines, patient would be at acceptable risk for the planned procedure without further cardiovascular testing.  H/o lumbar surgery with posterior fusion L3-S1.   VS: BP 127/84   Pulse 75   Temp 37.1 C (Oral)   Resp 20   Ht 5' 11 (1.803 m)   Wt 117 kg   SpO2 100%   BMI 35.98 kg/m   PROVIDERS: Tur[place], Worth SQUIBB, PA is PCP   Primary Cardiologist:  Madonna Large, DO  LABS: Labs reviewed: Acceptable for surgery. (all labs ordered are listed, but only abnormal results are displayed)  Labs Reviewed  CBC WITH DIFFERENTIAL/PLATELET - Abnormal; Notable for the following components:      Result Value   Hemoglobin 11.5 (*)    MCH 25.7 (*)    RDW 17.2 (*)    All other components within normal limits  COMPREHENSIVE METABOLIC PANEL WITH GFR - Abnormal; Notable for the following components:   Calcium  8.6 (*)    All other components within normal limits  SURGICAL PCR SCREEN  TYPE AND SCREEN     IMAGES:   EKG:   CV: Exercise treadmill stress test 05/20/2023: Exercise treadmill stress test performed using Bruce protocol.  Patient exercised for a total of 4 minutes and 10 seconds, achieving 6.0 METS, and 83% of age  predicted maximum heart rate.  Exercise capacity was low.  No chest pain reported.  Near optimal heart rate and hemodynamic response. Stress EKG revealed no ischemic changes. Low risk study.   Echocardiogram 04/12/2023:  Normal LV systolic function with visual EF 60-65%. Left ventricle cavity  is normal in size. Normal global wall motion. Normal diastolic filling  pattern, normal LAP. Mild left ventricular hypertrophy.  Pericardium is normal. Trace pericardial effusion. There is no hemodynamic  significance.  No significant valvular heart disease.  No prior study for comparison.  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   Complication of anesthesia    Depression    History of pneumonia    Hypertension    Infertility, female    Joint pain    Lumbar spondylosis    Neuromuscular disease (HCC)    numbness, paresthesias RLE, from failed lumbar back surgery   Osteoarthritis    PCOS (polycystic ovarian syndrome)    Pneumonia    2016   PONV (postoperative nausea and vomiting)    Pre-diabetes    Swelling of lower extremity     Past Surgical History:  Procedure Laterality Date   ABDOMINAL EXPOSURE N/A 02/17/2018  Procedure: ABDOMINAL EXPOSURE;  Surgeon: Oris Krystal FALCON, MD;  Location[facility] OR;  Service: Vascular;  Laterality: N/A;   ANKLE SURGERY Bilateral    x 2   ANTERIOR LAT LUMBAR FUSION N/A 02/17/2018   Procedure: Lumbar three-four Lumbar four-five  Anterolateral lumbar interbody fusion;  Surgeon: Colon Shove, MD;  Location: [facility] OR;  Service: Neurosurgery;  Laterality: N/A;   ANTERIOR LUMBAR FUSION N/A 02/17/2018   Procedure: Lumbar five-Sacral one Anterior lumbar interbody fusion;  Surgeon: Colon Shove, MD;  Location: [facility] OR;  Service: Neurosurgery;  Laterality: N/A;   APPLICATION OF ROBOTIC ASSISTANCE FOR SPINAL  PROCEDURE N/A 02/17/2018   Procedure: APPLICATION OF ROBOTIC ASSISTANCE FOR SPINAL PROCEDURE;  Surgeon: Colon Shove, MD;  Location: [facility] OR;  Service: Neurosurgery;  Laterality: N/A;   APPLICATION OF ROBOTIC ASSISTANCE FOR SPINAL PROCEDURE N/A 06/09/2018   Procedure: APPLICATION OF ROBOTIC ASSISTANCE FOR SPINAL PROCEDURE;  Surgeon: Colon Shove, MD;  Location: [facility] OR;  Service: Neurosurgery;  Laterality: N/A;   BACK SURGERY     2017  discectomy   CARPAL TUNNEL RELEASE Left 07/04/2014   Procedure: LEFT CARPAL TUNNEL RELEASE;  Surgeon: Toribio FALCON Chancy, MD;  Location: [facility];  Service: Orthopedics;  Laterality: Left;   CARPAL TUNNEL RELEASE Right 08/08/2014   Procedure: RIGHT CARPAL TUNNEL RELEASE;  Surgeon: Toribio FALCON Chancy, MD;  Location: [facility];  Service: Orthopedics;  Laterality: Right;   COLONOSCOPY  2024   DILATION AND CURETTAGE OF UTERUS     DILATION AND EVACUATION  04/02/2011   Procedure: DILATATION AND EVACUATION (D&E);  Surgeon: Alm JAYSON Cook, MD;  Location: [facility];  Service: Gynecology;  Laterality: N/A;  dvt left mid thigh   DORSAL COMPARTMENT RELEASE Left 07/04/2014   Procedure: LEFT DEQUERVAINS;  Surgeon: Toribio FALCON Chancy, MD;  Location: [facility];  Service: Orthopedics;  Laterality: Left;   ENDOSCOPIC PLANTAR FASCIOTOMY Left 02/01/2002   HARDWARE REMOVAL Right 06/09/2018   Procedure: Repositioning of Right Lumbar three Right lumbar  four pedicle screw with MAZOR;  Surgeon: Colon Shove, MD;  Location: [facility] OR;  Service: Neurosurgery;  Laterality: Right;   HYSTEROSCOPY WITH D & C  07/17/2010   with exc. endometrial polyps   KNEE ARTHROSCOPY Right 03/07/2003; 01/14/2005; 08/31/2006   KNEE ARTHROSCOPY Left 03/21/2003; 11/26/2004   LUMBAR PERCUTANEOUS PEDICLE SCREW 3 LEVEL N/A 02/17/2018   Procedure: LUMBAR PERCUTANEOUS PEDICLE SCREW PLACEMENT LUMBAR THREE-SACRAL ONE;  Surgeon: Colon Shove, MD;  Location: [facility] OR;  Service: Neurosurgery;   Laterality: N/A;   SHOULDER ARTHROSCOPY Right    TOE SURGERY Left 01/10/2003   claw toe correction 2nd, 3rd, 4th toes   TOTAL HIP ARTHROPLASTY Right 04/21/2016   Procedure: TOTAL HIP ARTHROPLASTY ANTERIOR APPROACH;  Surgeon: Toribio FALCON Chancy, MD;  Location: [facility] OR;  Service: Orthopedics;  Laterality: Right;   TOTAL KNEE ARTHROPLASTY Left 01/21/2010   TOTAL KNEE ARTHROPLASTY Right 08/26/2008   UPPER GI ENDOSCOPY N/A 04/14/2021   Procedure: UPPER GI ENDOSCOPY;  Surgeon: Gladis Cough, MD;  Location[facility];  Service: General;  Laterality: N/A;    MEDICATIONS:  Albuterol  Sulfate (PROAIR  RESPICLICK) 108 (90 Base) MCG/ACT AEPB   baclofen  (LIORESAL ) 10 MG tablet   baclofen  (LIORESAL ) 10 MG tablet   buPROPion  (WELLBUTRIN  XL) 150 MG 24 hr tablet   calcium  carbonate (TUMS EX) 750 MG chewable tablet   carvedilol (COREG) 6.25 MG tablet   docusate sodium  (COLACE) 100 MG capsule   EPINEPHrine  0.3 mg/0.3 mL IJ SOAJ injection  furosemide  (LASIX ) 20 MG tablet   ibuprofen  (ADVIL ) 800 MG tablet   levocetirizine (XYZAL ALLERGY 24HR) 5 MG tablet   lidocaine  (LIDODERM ) 5 %   lidocaine  (LIDODERM ) 5 %   lisdexamfetamine (VYVANSE) 50 MG capsule   losartan -hydrochlorothiazide  (HYZAAR) 100-12.5 MG tablet   Multiple Vitamins-Minerals (BARIATRIC MULTIVITAMINS/IRON PO)   oxyCODONE -acetaminophen  (PERCOCET) 10-325 MG tablet   oxyCODONE -acetaminophen  (PERCOCET) 10-325 MG tablet   oxyCODONE -acetaminophen  (PERCOCET) 10-325 MG tablet   pregabalin  (LYRICA ) 75 MG capsule   pregabalin  (LYRICA ) 75 MG capsule   rosuvastatin  (CRESTOR ) 10 MG tablet   Vitamin D -Vitamin K (K2 PLUS D3 PO)   zolpidem  (AMBIEN ) 10 MG tablet   No current facility-administered medications for this encounter.    Harlene Hoots Ward, PA-C [facility] Pre-Surgical Testing [phone]

## 2024-04-03 DIAGNOSIS — J3081 Allergic rhinitis due to animal (cat) (dog) hair and dander: Secondary | ICD-10-CM | POA: Diagnosis not present

## 2024-04-03 DIAGNOSIS — J3089 Other allergic rhinitis: Secondary | ICD-10-CM | POA: Diagnosis not present

## 2024-04-03 DIAGNOSIS — J301 Allergic rhinitis due to pollen: Secondary | ICD-10-CM | POA: Diagnosis not present

## 2024-04-03 NOTE — Anesthesia Preprocedure Evaluation (Signed)
 Anesthesia Evaluation  Patient identified by MRN, date of birth, ID band Patient awake    Reviewed: Allergy & Precautions, NPO status , Patient's Chart, lab work & pertinent test results  History of Anesthesia Complications (+) PONV and history of anesthetic complications  Airway Mallampati: II  TM Distance: >3 FB Neck ROM: Full    Dental no notable dental hx. (+) Teeth Intact, Dental Advisory Given   Pulmonary    Pulmonary exam normal breath sounds clear to auscultation       Cardiovascular hypertension, Pt. on medications (-) angina + DVT  (-) Past MI Normal cardiovascular exam Rhythm:Regular Rate:Normal  Echocardiogram 04/12/2023:  Normal LV systolic function with visual EF 60-65%. Left ventricle cavity  is normal in size. Normal global wall motion. Normal diastolic filling  pattern, normal LAP. Mild left ventricular hypertrophy.  Pericardium is normal. Trace pericardial effusion. There is no hemodynamic  significance.     Neuro/Psych  PSYCHIATRIC DISORDERS Anxiety Depression     Neuromuscular disease    GI/Hepatic negative GI ROS,,,  Endo/Other    Renal/GU Lab Results      Component                Value               Date                      WBC                      8.0                 03/22/2024                HGB                      11.5 (L)            03/22/2024                HCT                      37.5                03/22/2024                MCV                      83.9                03/22/2024                PLT                      201                 03/22/2024                Musculoskeletal  (+) Arthritis ,  Lumbar fusion L3-S1   Abdominal   Peds  Hematology Lab Results      Component                Value               Date                      WBC  8.0                 03/22/2024                HGB                      11.5 (L)            03/22/2024                HCT                       37.5                03/22/2024                MCV                      83.9                03/22/2024                PLT                      201                 03/22/2024              Anesthesia Other Findings All: Codeine, Duloxetine  Reproductive/Obstetrics                              Anesthesia Physical Anesthesia Plan  ASA: 2  Anesthesia Plan: General   Post-op Pain Management: Ketamine  IV* and Ofirmev  IV (intra-op)*   Induction: Intravenous  PONV Risk Score and Plan: Treatment may vary due to age or medical condition, Midazolam , Ondansetron , Dexamethasone , Droperidol and Scopolamine  patch - Pre-op  Airway Management Planned: Oral ETT  Additional Equipment: None  Intra-op Plan:   Post-operative Plan: Extubation in OR  Informed Consent:      Dental advisory given  Plan Discussed with:   Anesthesia Plan Comments:          Anesthesia Quick Evaluation

## 2024-04-04 ENCOUNTER — Encounter (HOSPITAL_COMMUNITY): Payer: Self-pay | Admitting: Orthopedic Surgery

## 2024-04-04 ENCOUNTER — Inpatient Hospital Stay (HOSPITAL_COMMUNITY)
Admission: RE | Admit: 2024-04-04 | Discharge: 2024-04-18 | DRG: 467 | Disposition: A | Discharge status: Skilled Nursing Facility | Attending: Orthopedic Surgery | Admitting: Orthopedic Surgery

## 2024-04-04 ENCOUNTER — Other Ambulatory Visit: Payer: Self-pay

## 2024-04-04 ENCOUNTER — Inpatient Hospital Stay (HOSPITAL_COMMUNITY)

## 2024-04-04 ENCOUNTER — Inpatient Hospital Stay (HOSPITAL_COMMUNITY): Payer: Self-pay | Admitting: Physician Assistant

## 2024-04-04 ENCOUNTER — Encounter (HOSPITAL_COMMUNITY)
Admission: RE | Disposition: A | Discharge status: Skilled Nursing Facility | Payer: Self-pay | Source: Home / Self Care | Attending: Orthopedic Surgery

## 2024-04-04 ENCOUNTER — Inpatient Hospital Stay (HOSPITAL_COMMUNITY): Admitting: Anesthesiology

## 2024-04-04 DIAGNOSIS — D649 Anemia, unspecified: Secondary | ICD-10-CM | POA: Diagnosis not present

## 2024-04-04 DIAGNOSIS — I10 Essential (primary) hypertension: Secondary | ICD-10-CM | POA: Diagnosis not present

## 2024-04-04 DIAGNOSIS — Y831 Surgical operation with implant of artificial internal device as the cause of abnormal reaction of the patient, or of later complication, without mention of misadventure at the time of the procedure: Secondary | ICD-10-CM | POA: Diagnosis present

## 2024-04-04 DIAGNOSIS — M545 Low back pain, unspecified: Secondary | ICD-10-CM | POA: Diagnosis not present

## 2024-04-04 DIAGNOSIS — T84018A Broken internal joint prosthesis, other site, initial encounter: Secondary | ICD-10-CM | POA: Diagnosis present

## 2024-04-04 DIAGNOSIS — Z888 Allergy status to other drugs, medicaments and biological substances status: Secondary | ICD-10-CM

## 2024-04-04 DIAGNOSIS — F419 Anxiety disorder, unspecified: Secondary | ICD-10-CM | POA: Diagnosis present

## 2024-04-04 DIAGNOSIS — Z79899 Other long term (current) drug therapy: Secondary | ICD-10-CM

## 2024-04-04 DIAGNOSIS — R7303 Prediabetes: Secondary | ICD-10-CM | POA: Diagnosis not present

## 2024-04-04 DIAGNOSIS — Z6835 Body mass index (BMI) 35.0-35.9, adult: Secondary | ICD-10-CM

## 2024-04-04 DIAGNOSIS — G9340 Encephalopathy, unspecified: Secondary | ICD-10-CM | POA: Diagnosis not present

## 2024-04-04 DIAGNOSIS — Z7901 Long term (current) use of anticoagulants: Secondary | ICD-10-CM | POA: Diagnosis not present

## 2024-04-04 DIAGNOSIS — E785 Hyperlipidemia, unspecified: Secondary | ICD-10-CM | POA: Diagnosis present

## 2024-04-04 DIAGNOSIS — Z96653 Presence of artificial knee joint, bilateral: Secondary | ICD-10-CM | POA: Diagnosis present

## 2024-04-04 DIAGNOSIS — Z841 Family history of disorders of kidney and ureter: Secondary | ICD-10-CM

## 2024-04-04 DIAGNOSIS — E282 Polycystic ovarian syndrome: Secondary | ICD-10-CM | POA: Diagnosis present

## 2024-04-04 DIAGNOSIS — T84011A Broken internal left hip prosthesis, initial encounter: Principal | ICD-10-CM

## 2024-04-04 DIAGNOSIS — D696 Thrombocytopenia, unspecified: Secondary | ICD-10-CM | POA: Diagnosis not present

## 2024-04-04 DIAGNOSIS — Z803 Family history of malignant neoplasm of breast: Secondary | ICD-10-CM | POA: Diagnosis not present

## 2024-04-04 DIAGNOSIS — Z981 Arthrodesis status: Secondary | ICD-10-CM | POA: Diagnosis not present

## 2024-04-04 DIAGNOSIS — Z9109 Other allergy status, other than to drugs and biological substances: Secondary | ICD-10-CM

## 2024-04-04 DIAGNOSIS — F05 Delirium due to known physiological condition: Secondary | ICD-10-CM | POA: Diagnosis not present

## 2024-04-04 DIAGNOSIS — Z79891 Long term (current) use of opiate analgesic: Secondary | ICD-10-CM

## 2024-04-04 DIAGNOSIS — G894 Chronic pain syndrome: Secondary | ICD-10-CM | POA: Diagnosis present

## 2024-04-04 DIAGNOSIS — K9189 Other postprocedural complications and disorders of digestive system: Secondary | ICD-10-CM | POA: Diagnosis not present

## 2024-04-04 DIAGNOSIS — T84018D Broken internal joint prosthesis, other site, subsequent encounter: Secondary | ICD-10-CM | POA: Diagnosis not present

## 2024-04-04 DIAGNOSIS — F909 Attention-deficit hyperactivity disorder, unspecified type: Secondary | ICD-10-CM | POA: Diagnosis present

## 2024-04-04 DIAGNOSIS — Z9884 Bariatric surgery status: Secondary | ICD-10-CM

## 2024-04-04 DIAGNOSIS — T8451XA Infection and inflammatory reaction due to internal right hip prosthesis, initial encounter: Principal | ICD-10-CM | POA: Diagnosis present

## 2024-04-04 DIAGNOSIS — Z885 Allergy status to narcotic agent status: Secondary | ICD-10-CM | POA: Diagnosis not present

## 2024-04-04 DIAGNOSIS — R69 Illness, unspecified: Secondary | ICD-10-CM | POA: Diagnosis not present

## 2024-04-04 DIAGNOSIS — M6281 Muscle weakness (generalized): Secondary | ICD-10-CM | POA: Diagnosis not present

## 2024-04-04 DIAGNOSIS — M179 Osteoarthritis of knee, unspecified: Secondary | ICD-10-CM | POA: Diagnosis not present

## 2024-04-04 DIAGNOSIS — M1990 Unspecified osteoarthritis, unspecified site: Secondary | ICD-10-CM | POA: Diagnosis not present

## 2024-04-04 DIAGNOSIS — T8450XA Infection and inflammatory reaction due to unspecified internal joint prosthesis, initial encounter: Secondary | ICD-10-CM | POA: Diagnosis not present

## 2024-04-04 DIAGNOSIS — G5601 Carpal tunnel syndrome, right upper limb: Secondary | ICD-10-CM | POA: Diagnosis not present

## 2024-04-04 DIAGNOSIS — R4182 Altered mental status, unspecified: Secondary | ICD-10-CM | POA: Diagnosis not present

## 2024-04-04 DIAGNOSIS — F32A Depression, unspecified: Secondary | ICD-10-CM | POA: Diagnosis present

## 2024-04-04 DIAGNOSIS — R410 Disorientation, unspecified: Secondary | ICD-10-CM | POA: Insufficient documentation

## 2024-04-04 DIAGNOSIS — E669 Obesity, unspecified: Secondary | ICD-10-CM | POA: Diagnosis present

## 2024-04-04 DIAGNOSIS — M2550 Pain in unspecified joint: Secondary | ICD-10-CM | POA: Diagnosis not present

## 2024-04-04 DIAGNOSIS — T8489XA Other specified complication of internal orthopedic prosthetic devices, implants and grafts, initial encounter: Secondary | ICD-10-CM | POA: Diagnosis not present

## 2024-04-04 DIAGNOSIS — Z471 Aftercare following joint replacement surgery: Secondary | ICD-10-CM | POA: Diagnosis not present

## 2024-04-04 DIAGNOSIS — B9689 Other specified bacterial agents as the cause of diseases classified elsewhere: Secondary | ICD-10-CM | POA: Diagnosis not present

## 2024-04-04 DIAGNOSIS — G9349 Other encephalopathy: Secondary | ICD-10-CM | POA: Diagnosis not present

## 2024-04-04 DIAGNOSIS — R2240 Localized swelling, mass and lump, unspecified lower limb: Secondary | ICD-10-CM | POA: Diagnosis not present

## 2024-04-04 DIAGNOSIS — T8859XA Other complications of anesthesia, initial encounter: Secondary | ICD-10-CM | POA: Diagnosis not present

## 2024-04-04 DIAGNOSIS — R569 Unspecified convulsions: Secondary | ICD-10-CM | POA: Diagnosis not present

## 2024-04-04 DIAGNOSIS — Z96649 Presence of unspecified artificial hip joint: Secondary | ICD-10-CM | POA: Diagnosis not present

## 2024-04-04 DIAGNOSIS — R262 Difficulty in walking, not elsewhere classified: Secondary | ICD-10-CM | POA: Diagnosis not present

## 2024-04-04 DIAGNOSIS — Z8249 Family history of ischemic heart disease and other diseases of the circulatory system: Secondary | ICD-10-CM | POA: Diagnosis not present

## 2024-04-04 DIAGNOSIS — Z833 Family history of diabetes mellitus: Secondary | ICD-10-CM

## 2024-04-04 DIAGNOSIS — R918 Other nonspecific abnormal finding of lung field: Secondary | ICD-10-CM | POA: Diagnosis not present

## 2024-04-04 DIAGNOSIS — M47896 Other spondylosis, lumbar region: Secondary | ICD-10-CM | POA: Diagnosis not present

## 2024-04-04 DIAGNOSIS — Z91013 Allergy to seafood: Secondary | ICD-10-CM | POA: Diagnosis not present

## 2024-04-04 DIAGNOSIS — Z7401 Bed confinement status: Secondary | ICD-10-CM | POA: Diagnosis not present

## 2024-04-04 DIAGNOSIS — Z4789 Encounter for other orthopedic aftercare: Secondary | ICD-10-CM | POA: Diagnosis not present

## 2024-04-04 DIAGNOSIS — Z96641 Presence of right artificial hip joint: Secondary | ICD-10-CM | POA: Diagnosis not present

## 2024-04-04 DIAGNOSIS — G709 Myoneural disorder, unspecified: Secondary | ICD-10-CM | POA: Diagnosis not present

## 2024-04-04 DIAGNOSIS — R400 Somnolence: Secondary | ICD-10-CM | POA: Diagnosis not present

## 2024-04-04 DIAGNOSIS — Z9889 Other specified postprocedural states: Secondary | ICD-10-CM | POA: Diagnosis not present

## 2024-04-04 LAB — POCT PREGNANCY, URINE: Preg Test, Ur: NEGATIVE

## 2024-04-04 SURGERY — REVISION, ARTHROPLASTY, HIP
Anesthesia: General | Site: Hip | Laterality: Right

## 2024-04-04 MED ORDER — FUROSEMIDE 20 MG PO TABS: 20.0000 mg | ORAL_TABLET | Freq: Every day | ORAL | Status: DC | PRN

## 2024-04-04 MED ORDER — TOBRAMYCIN SULFATE 1.2 G IJ SOLR
INTRAMUSCULAR | Status: DC | PRN
  Administered 2024-04-04: 1.2 g

## 2024-04-04 MED ORDER — ALBUTEROL SULFATE (2.5 MG/3ML) 0.083% IN NEBU: 3.0000 mL | INHALATION_SOLUTION | RESPIRATORY_TRACT | Status: DC | PRN

## 2024-04-04 MED ORDER — PHENOL 1.4 % MT LIQD: 1.0000 | OROMUCOSAL | Status: DC | PRN

## 2024-04-04 MED ORDER — BUPROPION HCL ER (XL) 150 MG PO TB24
150.0000 mg | ORAL_TABLET | Freq: Every day | ORAL | Status: DC
  Administered 2024-04-07 – 2024-04-18 (×12): 150 mg via ORAL
  Filled 2024-04-04 (×14): qty 1

## 2024-04-04 MED ORDER — METHYLENE BLUE (ANTIDOTE) 1 % IV SOLN
INTRAVENOUS | Status: AC
  Filled 2024-04-04: qty 10

## 2024-04-04 MED ORDER — CHLORHEXIDINE GLUCONATE 0.12 % MT SOLN
15.0000 mL | Freq: Once | OROMUCOSAL | Status: AC
  Administered 2024-04-04: 15 mL via OROMUCOSAL

## 2024-04-04 MED ORDER — LIDOCAINE HCL (CARDIAC) PF 100 MG/5ML IV SOSY
PREFILLED_SYRINGE | INTRAVENOUS | Status: DC | PRN
  Administered 2024-04-04: 100 mg via INTRAVENOUS

## 2024-04-04 MED ORDER — DEXAMETHASONE SODIUM PHOSPHATE 10 MG/ML IJ SOLN
INTRAMUSCULAR | Status: DC | PRN
  Administered 2024-04-04: 10 mg via INTRAVENOUS

## 2024-04-04 MED ORDER — VANCOMYCIN HCL 1000 MG IV SOLR
INTRAVENOUS | Status: AC
  Filled 2024-04-04: qty 40

## 2024-04-04 MED ORDER — LIDOCAINE 5 % EX PTCH
1.0000 | MEDICATED_PATCH | Freq: Every day | CUTANEOUS | Status: DC | PRN
  Administered 2024-04-08: 2 via TRANSDERMAL
  Filled 2024-04-04 (×2): qty 3

## 2024-04-04 MED ORDER — OMEPRAZOLE 40 MG PO CPDR: 40.0000 mg | DELAYED_RELEASE_CAPSULE | Freq: Every day | ORAL | 0 refills | Status: DC

## 2024-04-04 MED ORDER — FENTANYL CITRATE (PF) 100 MCG/2ML IJ SOLN
INTRAMUSCULAR | Status: AC
  Filled 2024-04-04: qty 2

## 2024-04-04 MED ORDER — ONDANSETRON HCL 4 MG/2ML IJ SOLN
INTRAMUSCULAR | Status: DC | PRN
Start: 2024-04-04 — End: 2024-04-04
  Administered 2024-04-04: 4 mg via INTRAVENOUS

## 2024-04-04 MED ORDER — DOCUSATE SODIUM 100 MG PO CAPS
100.0000 mg | ORAL_CAPSULE | Freq: Two times a day (BID) | ORAL | Status: DC
  Administered 2024-04-04 – 2024-04-18 (×23): 100 mg via ORAL
  Filled 2024-04-04 (×28): qty 1

## 2024-04-04 MED ORDER — HYDROMORPHONE HCL 1 MG/ML IJ SOLN
INTRAMUSCULAR | Status: DC | PRN
  Administered 2024-04-04 (×3): .5 mg via INTRAVENOUS

## 2024-04-04 MED ORDER — HYDROMORPHONE HCL 2 MG/ML IJ SOLN
INTRAMUSCULAR | Status: AC
  Filled 2024-04-04: qty 1

## 2024-04-04 MED ORDER — LIDOCAINE HCL (PF) 2 % IJ SOLN
INTRAMUSCULAR | Status: AC
  Filled 2024-04-04: qty 5

## 2024-04-04 MED ORDER — PHENYLEPHRINE 80 MCG/ML (10ML) SYRINGE FOR IV PUSH (FOR BLOOD PRESSURE SUPPORT)
PREFILLED_SYRINGE | INTRAVENOUS | Status: AC
  Filled 2024-04-04: qty 20

## 2024-04-04 MED ORDER — OXYCODONE HCL 5 MG PO TABS
10.0000 mg | ORAL_TABLET | ORAL | Status: DC | PRN
  Administered 2024-04-04 – 2024-04-08 (×6): 10 mg via ORAL
  Filled 2024-04-04: qty 2
  Filled 2024-04-04: qty 3
  Filled 2024-04-04 (×6): qty 2

## 2024-04-04 MED ORDER — POVIDONE-IODINE 10 % EX SWAB
2.0000 | Freq: Once | CUTANEOUS | Status: AC
Start: 2024-04-04 — End: 2024-04-04
  Administered 2024-04-04: 2 via TOPICAL

## 2024-04-04 MED ORDER — CARVEDILOL 6.25 MG PO TABS
6.2500 mg | ORAL_TABLET | Freq: Two times a day (BID) | ORAL | Status: DC
  Administered 2024-04-07 – 2024-04-18 (×22): 6.25 mg via ORAL
  Filled 2024-04-04 (×26): qty 1

## 2024-04-04 MED ORDER — ACETAMINOPHEN 500 MG PO TABS: 500.0000 mg | ORAL_TABLET | Freq: Two times a day (BID) | ORAL | Status: AC

## 2024-04-04 MED ORDER — BUPIVACAINE LIPOSOME 1.3 % IJ SUSP
INTRAMUSCULAR | Status: AC
  Filled 2024-04-04: qty 20

## 2024-04-04 MED ORDER — BUPIVACAINE LIPOSOME 1.3 % IJ SUSP: INTRAMUSCULAR | Status: DC | PRN

## 2024-04-04 MED ORDER — PANTOPRAZOLE SODIUM 40 MG PO TBEC
40.0000 mg | DELAYED_RELEASE_TABLET | Freq: Every day | ORAL | Status: DC
  Administered 2024-04-04 – 2024-04-18 (×13): 40 mg via ORAL
  Filled 2024-04-04 (×15): qty 1

## 2024-04-04 MED ORDER — LISDEXAMFETAMINE DIMESYLATE 30 MG PO CAPS: 50.0000 mg | ORAL_CAPSULE | Freq: Every day | ORAL | Status: DC

## 2024-04-04 MED ORDER — LOSARTAN POTASSIUM 50 MG PO TABS
100.0000 mg | ORAL_TABLET | Freq: Every day | ORAL | Status: DC
  Administered 2024-04-07 – 2024-04-18 (×12): 100 mg via ORAL
  Filled 2024-04-04 (×14): qty 2

## 2024-04-04 MED ORDER — ONDANSETRON HCL 4 MG/2ML IJ SOLN
INTRAMUSCULAR | Status: AC
  Filled 2024-04-04: qty 2

## 2024-04-04 MED ORDER — APIXABAN 2.5 MG PO TABS
2.5000 mg | ORAL_TABLET | Freq: Two times a day (BID) | ORAL | Status: DC
  Filled 2024-04-04 (×2): qty 1

## 2024-04-04 MED ORDER — LACTATED RINGERS IV SOLN: INTRAVENOUS | Status: DC

## 2024-04-04 MED ORDER — LOSARTAN POTASSIUM-HCTZ 100-12.5 MG PO TABS: 1.0000 | ORAL_TABLET | Freq: Every day | ORAL | Status: DC

## 2024-04-04 MED ORDER — SCOPOLAMINE 1 MG/3DAYS TD PT72
1.0000 | MEDICATED_PATCH | TRANSDERMAL | Status: DC
  Administered 2024-04-04: 1.5 mg via TRANSDERMAL
  Filled 2024-04-04: qty 1

## 2024-04-04 MED ORDER — DAPTOMYCIN-SODIUM CHLORIDE 700-0.9 MG/100ML-% IV SOLN
8.0000 mg/kg | Freq: Every day | INTRAVENOUS | Status: DC
  Administered 2024-04-04 – 2024-04-09 (×6): 700 mg via INTRAVENOUS
  Filled 2024-04-04 (×7): qty 100

## 2024-04-04 MED ORDER — ROCURONIUM BROMIDE 100 MG/10ML IV SOLN
INTRAVENOUS | Status: DC | PRN
  Administered 2024-04-04: 10 mg via INTRAVENOUS
  Administered 2024-04-04: 60 mg via INTRAVENOUS
  Administered 2024-04-04 (×3): 10 mg via INTRAVENOUS

## 2024-04-04 MED ORDER — PHENYLEPHRINE 80 MCG/ML (10ML) SYRINGE FOR IV PUSH (FOR BLOOD PRESSURE SUPPORT)
PREFILLED_SYRINGE | INTRAVENOUS | Status: AC
  Filled 2024-04-04: qty 10

## 2024-04-04 MED ORDER — ORAL CARE MOUTH RINSE: 15.0000 mL | Freq: Once | OROMUCOSAL | Status: AC

## 2024-04-04 MED ORDER — ROSUVASTATIN CALCIUM 10 MG PO TABS
10.0000 mg | ORAL_TABLET | Freq: Every day | ORAL | Status: DC
  Filled 2024-04-04: qty 1

## 2024-04-04 MED ORDER — BUPIVACAINE-EPINEPHRINE (PF) 0.25% -1:200000 IJ SOLN: INTRAMUSCULAR | Status: DC | PRN

## 2024-04-04 MED ORDER — SUGAMMADEX SODIUM 200 MG/2ML IV SOLN
INTRAVENOUS | Status: DC | PRN
  Administered 2024-04-04: 200 mg via INTRAVENOUS

## 2024-04-04 MED ORDER — DEXMEDETOMIDINE HCL IN NACL 80 MCG/20ML IV SOLN
INTRAVENOUS | Status: AC
  Filled 2024-04-04: qty 20

## 2024-04-04 MED ORDER — PHENYLEPHRINE 80 MCG/ML (10ML) SYRINGE FOR IV PUSH (FOR BLOOD PRESSURE SUPPORT)
PREFILLED_SYRINGE | INTRAVENOUS | Status: DC | PRN
  Administered 2024-04-04: 80 ug via INTRAVENOUS
  Administered 2024-04-04 (×2): 160 ug via INTRAVENOUS
  Administered 2024-04-04: 80 ug via INTRAVENOUS
  Administered 2024-04-04 (×5): 160 ug via INTRAVENOUS
  Administered 2024-04-04: 80 ug via INTRAVENOUS
  Administered 2024-04-04: 160 ug via INTRAVENOUS
  Administered 2024-04-04 (×2): 80 ug via INTRAVENOUS
  Administered 2024-04-04: 160 ug via INTRAVENOUS

## 2024-04-04 MED ORDER — ZOLPIDEM TARTRATE 5 MG PO TABS: 5.0000 mg | ORAL_TABLET | Freq: Every evening | ORAL | Status: DC | PRN

## 2024-04-04 MED ORDER — BUPIVACAINE LIPOSOME 1.3 % IJ SUSP: 10.0000 mL | Freq: Once | INTRAMUSCULAR | Status: DC

## 2024-04-04 MED ORDER — ROCURONIUM BROMIDE 10 MG/ML (PF) SYRINGE
PREFILLED_SYRINGE | INTRAVENOUS | Status: AC
  Filled 2024-04-04: qty 10

## 2024-04-04 MED ORDER — 0.9 % SODIUM CHLORIDE (POUR BTL) OPTIME
TOPICAL | Status: DC | PRN
  Administered 2024-04-04 (×2): 1000 mL

## 2024-04-04 MED ORDER — CEFAZOLIN SODIUM 1 G IJ SOLR
INTRAMUSCULAR | Status: AC
  Filled 2024-04-04: qty 20

## 2024-04-04 MED ORDER — METHOCARBAMOL 500 MG PO TABS
500.0000 mg | ORAL_TABLET | Freq: Four times a day (QID) | ORAL | Status: DC | PRN
  Administered 2024-04-07 – 2024-04-17 (×13): 500 mg via ORAL
  Filled 2024-04-04 (×14): qty 1

## 2024-04-04 MED ORDER — ACETAMINOPHEN 500 MG PO TABS
1000.0000 mg | ORAL_TABLET | Freq: Four times a day (QID) | ORAL | Status: AC
  Administered 2024-04-04 (×2): 1000 mg via ORAL
  Filled 2024-04-04 (×3): qty 2

## 2024-04-04 MED ORDER — APIXABAN 2.5 MG PO TABS: 2.5000 mg | ORAL_TABLET | Freq: Two times a day (BID) | ORAL | 0 refills | Status: DC

## 2024-04-04 MED ORDER — DEXAMETHASONE SODIUM PHOSPHATE 10 MG/ML IJ SOLN
INTRAMUSCULAR | Status: AC
Start: 2024-04-04 — End: 2024-04-04
  Filled 2024-04-04: qty 1

## 2024-04-04 MED ORDER — ONDANSETRON HCL 4 MG/2ML IJ SOLN: 4.0000 mg | Freq: Four times a day (QID) | INTRAMUSCULAR | Status: DC | PRN

## 2024-04-04 MED ORDER — VANCOMYCIN HCL 1000 MG IV SOLR
INTRAVENOUS | Status: DC | PRN
  Administered 2024-04-04: 2000 mg

## 2024-04-04 MED ORDER — WATER FOR IRRIGATION, STERILE IR SOLN
Status: DC | PRN
  Administered 2024-04-04: 2000 mL
  Administered 2024-04-04: 1000 mL via SURGICAL_CAVITY

## 2024-04-04 MED ORDER — VANCOMYCIN HCL 1000 MG IV SOLR
INTRAVENOUS | Status: DC | PRN
  Administered 2024-04-04 (×2): 1000 mg

## 2024-04-04 MED ORDER — ACETAMINOPHEN 10 MG/ML IV SOLN: 1000.0000 mg | Freq: Once | INTRAVENOUS | Status: DC | PRN

## 2024-04-04 MED ORDER — METHYLENE BLUE (ANTIDOTE) 1 % IV SOLN: INTRAVENOUS | Status: DC | PRN

## 2024-04-04 MED ORDER — SODIUM CHLORIDE 0.9 % IR SOLN
Status: DC | PRN
  Administered 2024-04-04 (×3): 3000 mL

## 2024-04-04 MED ORDER — TOBRAMYCIN SULFATE 1.2 G IJ SOLR
INTRAMUSCULAR | Status: AC
  Filled 2024-04-04: qty 2.4

## 2024-04-04 MED ORDER — MENTHOL 3 MG MT LOZG: 1.0000 | LOZENGE | OROMUCOSAL | Status: DC | PRN

## 2024-04-04 MED ORDER — OXYCODONE HCL 5 MG PO TABS: 2.5000 mg | ORAL_TABLET | Freq: Four times a day (QID) | ORAL | 0 refills | Status: DC | PRN

## 2024-04-04 MED ORDER — METHOCARBAMOL 500 MG PO TABS: 500.0000 mg | ORAL_TABLET | Freq: Three times a day (TID) | ORAL | 0 refills | Status: AC | PRN

## 2024-04-04 MED ORDER — AMISULPRIDE (ANTIEMETIC) 5 MG/2ML IV SOLN: 10.0000 mg | Freq: Once | INTRAVENOUS | Status: DC | PRN

## 2024-04-04 MED ORDER — PROPOFOL 10 MG/ML IV BOLUS
INTRAVENOUS | Status: AC
  Filled 2024-04-04: qty 20

## 2024-04-04 MED ORDER — KETAMINE HCL 50 MG/5ML IJ SOSY
PREFILLED_SYRINGE | INTRAMUSCULAR | Status: AC
  Filled 2024-04-04: qty 5

## 2024-04-04 MED ORDER — HYDROMORPHONE HCL 1 MG/ML IJ SOLN: 0.2500 mg | INTRAMUSCULAR | Status: DC | PRN

## 2024-04-04 MED ORDER — MIDAZOLAM HCL 5 MG/5ML IJ SOLN
INTRAMUSCULAR | Status: DC | PRN
  Administered 2024-04-04: 2 mg via INTRAVENOUS

## 2024-04-04 MED ORDER — DEXAMETHASONE SODIUM PHOSPHATE 10 MG/ML IJ SOLN: 8.0000 mg | Freq: Once | INTRAMUSCULAR | Status: DC

## 2024-04-04 MED ORDER — ACETAMINOPHEN 500 MG PO TABS
1000.0000 mg | ORAL_TABLET | Freq: Once | ORAL | Status: AC
  Administered 2024-04-04: 1000 mg via ORAL
  Filled 2024-04-04: qty 2

## 2024-04-04 MED ORDER — PROPOFOL 10 MG/ML IV BOLUS
INTRAVENOUS | Status: DC | PRN
  Administered 2024-04-04: 170 mg via INTRAVENOUS

## 2024-04-04 MED ORDER — HYDROMORPHONE HCL 1 MG/ML IJ SOLN
0.5000 mg | INTRAMUSCULAR | Status: DC | PRN
  Administered 2024-04-07 – 2024-04-10 (×3): 1 mg via INTRAVENOUS
  Filled 2024-04-04 (×3): qty 1

## 2024-04-04 MED ORDER — MIDAZOLAM HCL 2 MG/2ML IJ SOLN
INTRAMUSCULAR | Status: AC
  Filled 2024-04-04: qty 2

## 2024-04-04 MED ORDER — KETAMINE HCL 50 MG/5ML IJ SOSY
PREFILLED_SYRINGE | INTRAMUSCULAR | Status: DC | PRN
Start: 2024-04-04 — End: 2024-04-04
  Administered 2024-04-04: 20 mg via INTRAVENOUS

## 2024-04-04 MED ORDER — METHOCARBAMOL 1000 MG/10ML IJ SOLN: 500.0000 mg | Freq: Four times a day (QID) | INTRAMUSCULAR | Status: DC | PRN

## 2024-04-04 MED ORDER — CEFAZOLIN SODIUM-DEXTROSE 2-4 GM/100ML-% IV SOLN
2.0000 g | INTRAVENOUS | Status: AC
  Administered 2024-04-04 (×2): 2 g via INTRAVENOUS
  Filled 2024-04-04: qty 100

## 2024-04-04 MED ORDER — ONDANSETRON HCL 4 MG PO TABS: 4.0000 mg | ORAL_TABLET | Freq: Three times a day (TID) | ORAL | 0 refills | Status: DC | PRN

## 2024-04-04 MED ORDER — DEXMEDETOMIDINE HCL IN NACL 80 MCG/20ML IV SOLN
INTRAVENOUS | Status: DC | PRN
Start: 2024-04-04 — End: 2024-04-04
  Administered 2024-04-04 (×4): 4 ug via INTRAVENOUS

## 2024-04-04 MED ORDER — PREGABALIN 75 MG PO CAPS
75.0000 mg | ORAL_CAPSULE | Freq: Three times a day (TID) | ORAL | Status: DC
  Administered 2024-04-04 – 2024-04-09 (×8): 75 mg via ORAL
  Filled 2024-04-04 (×12): qty 1

## 2024-04-04 MED ORDER — HYDROCHLOROTHIAZIDE 12.5 MG PO TABS
12.5000 mg | ORAL_TABLET | Freq: Every day | ORAL | Status: DC
  Administered 2024-04-07 – 2024-04-18 (×12): 12.5 mg via ORAL
  Filled 2024-04-04 (×14): qty 1

## 2024-04-04 MED ORDER — SODIUM CHLORIDE 0.9 % IV SOLN
2.0000 g | INTRAVENOUS | Status: DC
  Administered 2024-04-04 – 2024-04-05 (×2): 2 g via INTRAVENOUS
  Filled 2024-04-04 (×2): qty 20

## 2024-04-04 MED ORDER — ACETAMINOPHEN 325 MG PO TABS
325.0000 mg | ORAL_TABLET | Freq: Four times a day (QID) | ORAL | Status: DC | PRN
  Administered 2024-04-08 – 2024-04-09 (×2): 650 mg via ORAL
  Administered 2024-04-10: 325 mg via ORAL
  Administered 2024-04-11 – 2024-04-13 (×5): 650 mg via ORAL
  Administered 2024-04-14: 325 mg via ORAL
  Administered 2024-04-14 – 2024-04-16 (×3): 650 mg via ORAL
  Filled 2024-04-04 (×14): qty 2

## 2024-04-04 MED ORDER — BUPIVACAINE-EPINEPHRINE (PF) 0.25% -1:200000 IJ SOLN
INTRAMUSCULAR | Status: AC
  Filled 2024-04-04: qty 30

## 2024-04-04 MED ORDER — POLYETHYLENE GLYCOL 3350 17 G PO PACK: 17.0000 g | PACK | Freq: Every day | ORAL | 0 refills | Status: DC

## 2024-04-04 MED ORDER — TRANEXAMIC ACID-NACL 1000-0.7 MG/100ML-% IV SOLN
1000.0000 mg | INTRAVENOUS | Status: AC
  Administered 2024-04-04: 1000 mg via INTRAVENOUS
  Filled 2024-04-04: qty 100

## 2024-04-04 MED ORDER — ONDANSETRON HCL 4 MG PO TABS
4.0000 mg | ORAL_TABLET | Freq: Four times a day (QID) | ORAL | Status: DC | PRN
  Administered 2024-04-12: 4 mg via ORAL
  Filled 2024-04-04: qty 1

## 2024-04-04 MED ORDER — OXYCODONE HCL 5 MG/5ML PO SOLN: 5.0000 mg | Freq: Once | ORAL | Status: DC | PRN

## 2024-04-04 MED ORDER — SENNA 8.6 MG PO TABS
1.0000 | ORAL_TABLET | Freq: Two times a day (BID) | ORAL | Status: DC
  Administered 2024-04-04 – 2024-04-18 (×20): 8.6 mg via ORAL
  Filled 2024-04-04 (×26): qty 1

## 2024-04-04 MED ORDER — FENTANYL CITRATE (PF) 100 MCG/2ML IJ SOLN
INTRAMUSCULAR | Status: DC | PRN
  Administered 2024-04-04 (×2): 50 ug via INTRAVENOUS
  Administered 2024-04-04: 100 ug via INTRAVENOUS

## 2024-04-04 MED ORDER — OXYCODONE HCL 5 MG PO TABS: 5.0000 mg | ORAL_TABLET | Freq: Once | ORAL | Status: DC | PRN

## 2024-04-04 MED ORDER — ISOPROPYL ALCOHOL 70 % SOLN
Status: DC | PRN
  Administered 2024-04-04: 1 via TOPICAL

## 2024-04-04 MED ORDER — POLYETHYLENE GLYCOL 3350 17 G PO PACK: 17.0000 g | PACK | Freq: Every day | ORAL | Status: DC | PRN

## 2024-04-04 MED ORDER — DIPHENHYDRAMINE HCL 12.5 MG/5ML PO ELIX: 12.5000 mg | ORAL_SOLUTION | ORAL | Status: DC | PRN

## 2024-04-04 MED ORDER — LORATADINE 10 MG PO TABS: 10.0000 mg | ORAL_TABLET | Freq: Every day | ORAL | Status: DC | PRN

## 2024-04-04 MED ORDER — CEFAZOLIN SODIUM-DEXTROSE 2-4 GM/100ML-% IV SOLN: 2.0000 g | Freq: Four times a day (QID) | INTRAVENOUS | Status: DC

## 2024-04-04 MED ORDER — ONDANSETRON HCL 4 MG/2ML IJ SOLN: 4.0000 mg | Freq: Once | INTRAMUSCULAR | Status: DC | PRN

## 2024-04-04 MED ORDER — SODIUM CHLORIDE 0.9 % IV SOLN: INTRAVENOUS | Status: DC

## 2024-04-04 SURGICAL SUPPLY — 74 items
BAG COUNTER SPONGE SURGICOUNT (BAG) IMPLANT
BAG ZIPLOCK 12X15 (MISCELLANEOUS) ×2 IMPLANT
BLADE SAW SAG 25X90X1.19 (BLADE) IMPLANT
BLADE SAW SGTL 81X20 HD (BLADE) IMPLANT
BUR OVAL CARBIDE 4.0 (BURR) IMPLANT
CHLORAPREP W/TINT 26 (MISCELLANEOUS) ×4 IMPLANT
CNTNR URN SCR LID CUP LEK RST (MISCELLANEOUS) IMPLANT
COVER SURGICAL LIGHT HANDLE (MISCELLANEOUS) ×2 IMPLANT
CUP ACET REMEDY 40ID 48OD (Cup) ×1 IMPLANT
DERMABOND ADVANCED .7 DNX12 (GAUZE/BANDAGES/DRESSINGS) ×2 IMPLANT
DEVICE DISSECT PLASMABLAD 3.0S (MISCELLANEOUS) ×1 IMPLANT
DRAPE C-ARM 42X120 X-RAY (DRAPES) IMPLANT
DRAPE C-ARMOR (DRAPES) IMPLANT
DRAPE HIP W/POCKET STRL (MISCELLANEOUS) ×2 IMPLANT
DRAPE INCISE IOBAN 66X45 STRL (DRAPES) IMPLANT
DRAPE INCISE IOBAN 85X60 (DRAPES) ×2 IMPLANT
DRAPE POUCH INSTRU U-SHP 10X18 (DRAPES) ×2 IMPLANT
DRAPE SHEET LG 3/4 BI-LAMINATE (DRAPES) ×6 IMPLANT
DRAPE U-SHAPE 47X51 STRL (DRAPES) ×4 IMPLANT
DRESSING AQUACEL AG SP 3.5X10 (GAUZE/BANDAGES/DRESSINGS) IMPLANT
DRSG AQUACEL AG ADV 3.5X10 (GAUZE/BANDAGES/DRESSINGS) IMPLANT
DRSG AQUACEL AG ADV 3.5X14 (GAUZE/BANDAGES/DRESSINGS) ×1 IMPLANT
ELECT BLADE TIP CTD 4 INCH (ELECTRODE) ×2 IMPLANT
ELECT NDL TIP 2.8 STRL (NEEDLE) IMPLANT
ELECT NEEDLE TIP 2.8 STRL (NEEDLE) IMPLANT
ELECT PENCIL ROCKER SW 15FT (MISCELLANEOUS) ×2 IMPLANT
ELECT PT RETURN MONO 15F ADT (MISCELLANEOUS) ×2 IMPLANT
ELECT REM PT RETURN 15FT ADLT (MISCELLANEOUS) ×2 IMPLANT
GAUZE SPONGE 4X4 12PLY STRL LF (GAUZE/BANDAGES/DRESSINGS) ×2 IMPLANT
GLOVE BIO SURGEON STRL SZ 6.5 (GLOVE) ×4 IMPLANT
GLOVE BIOGEL PI IND STRL 6.5 (GLOVE) ×2 IMPLANT
GLOVE BIOGEL PI IND STRL 8 (GLOVE) ×2 IMPLANT
GLOVE SURG ORTHO 8.0 STRL STRW (GLOVE) ×4 IMPLANT
GOWN STRL REUS W/ TWL XL LVL3 (GOWN DISPOSABLE) ×4 IMPLANT
HEAD FEM MOD REMEDY GV XS 40 (Head) ×1 IMPLANT
HOLDER FOLEY CATH W/STRAP (MISCELLANEOUS) ×2 IMPLANT
HOOD PEEL AWAY T7 (MISCELLANEOUS) ×6 IMPLANT
KIT BASIN OR (CUSTOM PROCEDURE TRAY) ×2 IMPLANT
KIT HIP STEM REMOVAL BLADES (KITS) ×1 IMPLANT
KIT TURNOVER KIT A (KITS) ×2 IMPLANT
MANIFOLD NEPTUNE II (INSTRUMENTS) ×2 IMPLANT
MARKER SKIN DUAL TIP RULER LAB (MISCELLANEOUS) ×2 IMPLANT
NDL SAFETY ECLIPSE 18X1.5 (NEEDLE) ×2 IMPLANT
NS IRRIG 1000ML POUR BTL (IV SOLUTION) ×2 IMPLANT
OSTEOTOME THIN 10 5 (MISCELLANEOUS) ×1 IMPLANT
PACK TOTAL JOINT (CUSTOM PROCEDURE TRAY) ×2 IMPLANT
PAD ARMBOARD POSITIONER FOAM (MISCELLANEOUS) ×2 IMPLANT
PROTECTOR NERVE ULNAR (MISCELLANEOUS) ×2 IMPLANT
RETRIEVER SUT HEWSON (MISCELLANEOUS) ×2 IMPLANT
SCREW HEX LP 6.5X30 (Screw) ×1 IMPLANT
SEALER BIPOLAR AQUA 6.0 (INSTRUMENTS) IMPLANT
SET HNDPC FAN SPRY TIP SCT (DISPOSABLE) ×2 IMPLANT
SOLUTION PRONTOSAN WOUND 350ML (IRRIGATION / IRRIGATOR) IMPLANT
SPIKE FLUID TRANSFER (MISCELLANEOUS) ×6 IMPLANT
STEM FEM REMEDY SM 227 (Stem) ×1 IMPLANT
STRIP CLOSURE SKIN 1/2X4 (GAUZE/BANDAGES/DRESSINGS) ×2 IMPLANT
SUCTION TUBE FRAZIER 12FR DISP (SUCTIONS) ×2 IMPLANT
SUT BONE WAX W31G (SUTURE) IMPLANT
SUT ETHIBOND #5 BRAIDED 30INL (SUTURE) ×2 IMPLANT
SUT ETHILON 3 0 PS 1 (SUTURE) IMPLANT
SUT MNCRL AB 3-0 PS2 18 (SUTURE) ×3 IMPLANT
SUT MON AB 2-0 CT1 36 (SUTURE) ×2 IMPLANT
SUT STRATAFIX 14 PDO 48 VLT (SUTURE) ×2 IMPLANT
SUT VIC AB 0 CT1 36 (SUTURE) ×2 IMPLANT
SUT VIC AB 2-0 CT2 27 (SUTURE) ×4 IMPLANT
SUTURE STRATFX 0 PDS 27 VIOLET (SUTURE) ×3 IMPLANT
SYR 30ML LL (SYRINGE) ×4 IMPLANT
SYR 50ML LL SCALE MARK (SYRINGE) ×2 IMPLANT
TOWEL GREEN STERILE FF (TOWEL DISPOSABLE) ×2 IMPLANT
TOWEL OR 17X26 10 PK STRL BLUE (TOWEL DISPOSABLE) ×2 IMPLANT
TRAY FOLEY MTR SLVR 16FR STAT (SET/KITS/TRAYS/PACK) ×2 IMPLANT
TUBE SUCTION HIGH CAP CLEAR NV (SUCTIONS) ×2 IMPLANT
UNDERPAD 30X36 HEAVY ABSORB (UNDERPADS AND DIAPERS) ×2 IMPLANT
WATER STERILE IRR 1000ML POUR (IV SOLUTION) ×4 IMPLANT

## 2024-04-04 NOTE — Interval H&P Note (Signed)
 The patient has been re-examined, and the chart reviewed, and there have been no interval changes to the documented history and physical.    Plan for explant of right hip arthoplasty with joint debridement and revision to articulating antibiotic spacer for right hip prosthetic joint infection.  The operative side was examined and the patient was confirmed to have sensation to DPN, SPN, TN intact, Motor EHL, ext, flex 5/5, and DP 2+, PT 2+, No significant edema.   The risks, benefits, and alternatives have been discussed at length with patient, and the patient is willing to proceed.  Right hip marked. Consent has been signed.

## 2024-04-04 NOTE — Plan of Care (Signed)
 Id brief note  Ortho planning 2 stage revision, first stage today for chronic right prosthetic hip pain Had office aspiration and cell count suggestive of infection. Per ortho team, not enough fluid to send for synovosure; and culture of aspirate is ngtd   -will start daptomycin  and ceftriaxone  empirically post-op today and follow cultures -ortho had planned to keep culture for 2 weeks

## 2024-04-04 NOTE — Discharge Instructions (Addendum)
 INSTRUCTIONS AFTER JOINT REPLACEMENT   Remove items at home which could result in a fall. This includes throw rugs or furniture in walking pathways ICE to the affected joint every three hours while awake for 30 minutes at a time, for at least the first 3-5 days, and then as needed for pain and swelling.  Continue to use ice for pain and swelling. You may notice swelling that will progress down to the foot and ankle.  This is normal after surgery.  Elevate your leg when you are not up walking on it.   Continue to use the breathing machine you got in the hospital (incentive spirometer) which will help keep your temperature down.  It is common for your temperature to cycle up and down following surgery, especially at night when you are not up moving around and exerting yourself.  The breathing machine keeps your lungs expanded and your temperature down.  DIET:  As you were doing prior to hospitalization, we recommend a well-balanced diet.  DRESSING / WOUND CARE / SHOWERING:  Keep the surgical dressing until follow up.  The dressing is water  proof, so you can shower without any extra covering.  IF THE DRESSING FALLS OFF or the wound gets wet inside, change the dressing with sterile gauze.  Please use good hand washing techniques before changing the dressing.  Do not use any lotions or creams on the incision until instructed by your surgeon.    ACTIVITY  Increase activity slowly as tolerated, but follow the weight bearing instructions below.   No driving for 6 weeks or until further direction given by your physician.  You cannot drive while taking narcotics.  No lifting or carrying greater than 10 lbs. until further directed by your surgeon. Avoid periods of inactivity such as sitting longer than an hour when not asleep. This helps prevent blood clots.  You may return to work once you are authorized by your doctor.   WEIGHT BEARING: Maximum of 50% weight bearing on your surgical leg until further  instructed.  EXERCISES  Results after joint replacement surgery are often greatly improved when you follow the exercise, range of motion and muscle strengthening exercises prescribed by your doctor. Safety measures are also important to protect the joint from further injury. Any time any of these exercises cause you to have increased pain or swelling, decrease what you are doing until you are comfortable again and then slowly increase them. If you have problems or questions, call your caregiver or physical therapist for advice.   Rehabilitation is important following a joint replacement. After just a few days of immobilization, the muscles of the leg can become weakened and shrink (atrophy).  These exercises are designed to build up the tone and strength of the thigh and leg muscles and to improve motion. Often times heat used for twenty to thirty minutes before working out will loosen up your tissues and help with improving the range of motion but do not use heat for the first two weeks following surgery (sometimes heat can increase post-operative swelling).   These exercises can be done on a training (exercise) mat, on the floor, on a table or on a bed. Use whatever works the best and is most comfortable for you.    Use music or television while you are exercising so that the exercises are a pleasant break in your day. This will make your life better with the exercises acting as a break in your routine that you can look forward  to.   Perform all exercises about fifteen times, three times per day or as directed.  You should exercise both the operative leg and the other leg as well.  Exercises include:   Quad Sets - Tighten up the muscle on the front of the thigh (Quad) and hold for 5-10 seconds.   Straight Leg Raises - With your knee straight (if you were given a brace, keep it on), lift the leg to 60 degrees, hold for 3 seconds, and slowly lower the leg.  Perform this exercise against resistance later  as your leg gets stronger.  Leg Slides: Lying on your back, slowly slide your foot toward your buttocks, bending your knee up off the floor (only go as far as is comfortable). Then slowly slide your foot back down until your leg is flat on the floor again.  Angel Wings: Lying on your back spread your legs to the side as far apart as you can without causing discomfort.  Hamstring Strength:  Lying on your back, push your heel against the floor with your leg straight by tightening up the muscles of your buttocks.  Repeat, but this time bend your knee to a comfortable angle, and push your heel against the floor.  You may put a pillow under the heel to make it more comfortable if necessary.   A rehabilitation program following joint replacement surgery can speed recovery and prevent re-injury in the future due to weakened muscles. Contact your doctor or a physical therapist for more information on knee rehabilitation.   CONSTIPATION:  Constipation is defined medically as fewer than three stools per week and severe constipation as less than one stool per week.  Even if you have a regular bowel pattern at home, your normal regimen is likely to be disrupted due to multiple reasons following surgery.  Combination of anesthesia, postoperative narcotics, change in appetite and fluid intake all can affect your bowels.   YOU MUST use at least one of the following options; they are listed in order of increasing strength to get the job done.  They are all available over the counter, and you may need to use some, POSSIBLY even all of these options:    Drink plenty of fluids (prune juice may be helpful) and high fiber foods Colace 100 mg by mouth twice a day  Senokot for constipation as directed and as needed Dulcolax (bisacodyl ), take with full glass of water   Miralax  (polyethylene glycol) once or twice a day as needed.  If you have tried all these things and are unable to have a bowel movement in the first 3-4 days  after surgery call either your surgeon or your primary doctor.    If you experience loose stools or diarrhea, hold the medications until you stool forms back up.  If your symptoms do not get better within 1 week or if they get worse, check with your doctor.  If you experience the worst abdominal pain ever or develop nausea or vomiting, please contact the office immediately for further recommendations for treatment.  ITCHING:  If you experience itching with your medications, try taking only a single pain pill, or even half a pain pill at a time.  You can also use Benadryl  over the counter for itching or also to help with sleep.   TED HOSE STOCKINGS:  Use stockings on both legs until for at least 2 weeks or as directed by physician office. They may be removed at night for sleeping.  MEDICATIONS:  See your medication summary on the "After Visit Summary" that nursing will review with you.  You may have some home medications which will be placed on hold until you complete the course of blood thinner medication.  It is important for you to complete the blood thinner medication as prescribed.  Blood clot prevention (DVT Prophylaxis): After surgery you are at an increased risk for a blood clot.  You were prescribed a blood thinner, Eliquis , to be taken twice daily for a total of 4 weeks from surgery to help reduce your risk of getting a blood clot.  Signs of a pulmonary embolus (blood clot in the lungs) include sudden short of breath, feeling lightheaded or dizzy, chest pain with a deep breath, rapid pulse rapid breathing.  Signs of a blood clot in your arms or legs include new unexplained swelling and cramping, warm, red or darkened skin around the painful area.  Please call the office or 911 right away if these signs or symptoms develop.  PRECAUTIONS:   If you experience chest pain or shortness of breath - call 911 immediately for transfer to the hospital emergency department.   If you develop a fever  greater that 101 F, purulent drainage from wound, increased redness or drainage from wound, foul odor from the wound/dressing, or calf pain - CONTACT YOUR SURGEON.                                                   FOLLOW-UP APPOINTMENTS:  If you do not already have a post-op appointment, please call the office for an appointment to be seen by your surgeon.  Guidelines for how soon to be seen are listed in your "After Visit Summary", but are typically between 2-3 weeks after surgery.  If you have a specialized bandage, you may be told to follow up 1 week after surgery.  POST-OPERATIVE OPIOID TAPER INSTRUCTIONS: It is important to wean off of your opioid medication as soon as possible. If you do not need pain medication after your surgery it is ok to stop day one. Opioids include: Codeine, Hydrocodone (Norco, Vicodin), Oxycodone (Percocet, oxycontin ) and hydromorphone  amongst others.  Long term and even short term use of opiods can cause: Increased pain response Dependence Constipation Depression Respiratory depression And more.  Withdrawal symptoms can include Flu like symptoms Nausea, vomiting And more Techniques to manage these symptoms Hydrate well Eat regular healthy meals Stay active Use relaxation techniques(deep breathing, meditating, yoga) Do Not substitute Alcohol  to help with tapering If you have been on opioids for less than two weeks and do not have pain than it is ok to stop all together.  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 Maintain the same interval or time between taking each dose and first decrease the dose.  Cut the total daily intake of opioids by one tablet each day Next start to increase the time between doses. The last dose that should be eliminated is the evening dose.   MAKE SURE YOU:  Understand these instructions.  Get help right away if you are not doing well or get worse.    Thank you for  letting us  be a part of your medical care team.  It is a privilege we respect greatly.  We hope these instructions will help you  stay on track for a fast and full recovery!    Information on my medicine - ELIQUIS  (apixaban )  Why was Eliquis  prescribed for you? Eliquis  was prescribed for you to reduce the risk of blood clots forming after orthopedic surgery.    What do You need to know about Eliquis ? Take your Eliquis  TWICE DAILY - one tablet in the morning and one tablet in the evening with or without food.  It would be best to take the dose about the same time each day.  If you have difficulty swallowing the tablet whole please discuss with your pharmacist how to take the medication safely.  Take Eliquis  exactly as prescribed by your doctor and DO NOT stop taking Eliquis  without talking to the doctor who prescribed the medication.  Stopping without other medication to take the place of Eliquis  may increase your risk of developing a clot.  After discharge, you should have regular check-up appointments with your healthcare provider that is prescribing your Eliquis .  What do you do if you miss a dose? If a dose of ELIQUIS  is not taken at the scheduled time, take it as soon as possible on the same day and twice-daily administration should be resumed.  The dose should not be doubled to make up for a missed dose.  Do not take more than one tablet of ELIQUIS  at the same time.  Important Safety Information A possible side effect of Eliquis  is bleeding. You should call your healthcare provider right away if you experience any of the following: Bleeding from an injury or your nose that does not stop. Unusual colored urine (red or dark brown) or unusual colored stools (red or black). Unusual bruising for unknown reasons. A serious fall or if you hit your head (even if there is no bleeding).  Some medicines may interact with Eliquis  and might increase your risk of bleeding or clotting  while on Eliquis . To help avoid this, consult your healthcare provider or pharmacist prior to using any new prescription or non-prescription medications, including herbals, vitamins, non-steroidal anti-inflammatory drugs (NSAIDs) and supplements.  This website has more information on Eliquis  (apixaban ): http://[email or web address] .com/eliquis dena

## 2024-04-04 NOTE — Op Note (Signed)
 04/04/2024  3:35 PM  PATIENT:  Ashley [name]   MRN: [id number]  PRE-OPERATIVE DIAGNOSIS: Right hip chronic prosthetic joint infection  POST-OPERATIVE DIAGNOSIS:  same  PROCEDURE: Right hip stage I revision explantation and debridement of right hip with implantation of antibiotic articulating hip spacer  PREOPERATIVE INDICATIONS:    MIIA BLANKS is an 48 y.o. female who had undergone right total hip arthroplasty by Dr. Rolan Chancy around 2017.  She had done well initially after surgery but is been having progressive worsening pain since about 2022 reportedly related to an injury.  Given continued pain discomfort in the right hip area she underwent aspiration and was found to have a fluid collection communicating from the hip to the anterior bursa this was aspirated and found to have 10,800 nucleated cells with 90% neutrophils.  Cultures were negative but these findings are highly suspicious for prosthetic joint infection given the normal cutoff of 3000 nucleated cells with over 66% neutrophils.  Given the continued pain and discomfort and concerning aspiration findings felt patient would benefit from two-stage revision.  Today plan for right hip explant debridement and implantation of antibiotic loaded articulating spacer.  .  The risks benefits and alternatives were discussed with the patient including but not limited to the risks of nonoperative treatment, versus surgical intervention including infection, bleeding, nerve injury, periprosthetic fracture, the need for revision surgery, dislocation, leg length discrepancy, blood clots, cardiopulmonary complications, morbidity, mortality, among others, and they were willing to proceed.     OPERATIVE REPORT     SURGEON:  Toribio Higashi, MD    ASSISTANT: Bernarda Mclean, PA-C, (Present throughout the entire procedure,  necessary for completion of procedure in a timely manner, assisting with retraction, instrumentation, and closure)      ANESTHESIA: general  ESTIMATED BLOOD LOSS: 900cc    COMPLICATIONS:  None.     UNIQUE ASPECTS OF THE CASE: Moderate purulent fluid and significant synovitis around the right hip prosthesis, hip replacement components were well-fixed.  3 synovial tissue specimens were sent as specimens 1 through 3.  There was concerning looking tissue in the anterior hip that was sent as specimen #4.  COMPONENTS :  48 mm osteoremedies cemented acetabular shell, size small short ostial remedies modular stem, 40 mm modular femoral head  Implant Name Type Inv. Item Serial No. Manufacturer Lot No. LRB No. Used Action  STEM FEM REMEDY SM 227 - [id number] Stem STEM FEM REMEDY SM 227  [employer] [id number] Right 1 Implanted  HEAD FEM MOD REMEDY GV XS 40 - [id number] Head HEAD FEM MOD REMEDY GV XS 40  [employer] [id number] Right 1 Implanted  CUP ACET REMEDY 40ID 48OD - [id number] Cup CUP ACET REMEDY 40ID 48OD  [employer] [id number] Right 1 Implanted    The aquamantis was utilized for this case to help facilitate better hemostasis as patient was felt to be at increased risk of bleeding because of complex case requiring increased OR time and/or exposure.       PROCEDURE IN DETAIL:  The patient was met in the holding area and  identified.  The appropriate hip was identified and marked at the operative site.  The patient was then transported to the OR  and  placed under anesthesia.  At that point, the patient was  placed in the lateral decubitus position with the operative side up and  secured to the operating room table  and all bony prominences padded. A subaxillary role was also placed.    The  operative lower extremity was prepped from the iliac crest to the distal leg.  Sterile draping was performed.  Preoperative antibiotics, 1.5 gm of vanc and 2g of cefepime,1 gm of Tranexamic Acid . Time out was performed prior to incision.      A routine posterolateral approach was utilized via sharp dissection   carried down to the subcutaneous tissue.  Gross bleeders were Bovie coagulated.  The iliotibial band was identified and incised along the length of the skin incision through the glute max fascia.  Charnley retractor was placed with care to protect the sciatic nerve posteriorly.  With the hip internally rotated, the piriformis tendon was identified and released from the femoral insertion..  A capsulotomy was then performed off the femoral insertion and also tagged with a #5 Ethibond.  Murky purulent fluid and synovitis was appreciated.  3 synovial tissue specimens were sent for aerobic anaerobic and fungal culture.  Plan to hold 14 days.   The hip was dislocated and the head ball was removed.  It was a 36-5 mm head ball.  We then turned our attention to explantation of the femoral component.  It was well-fixed and appropriately anteverted.  The stem was exposed circumferentially.  The Exodus blade system was used to help release the ingrowth on the component circumferentially.  These blades were passed anterior posterior medial and lateral.  A screw-in extractor was placed into the shoulder of the stem.  After multiple attempts the femoral component was able to be disengaged and removed.  There was minimal bone loss.  Next we turned our attention to the acetabular component.  The old liner was removed.  2 screws and acetabular shell were removed without difficulty.  We then used the acetabular curved osteotomes using a 50 mm short and long blade sequentially to free up the acetabular shell and it was removed with minimal bone loss.  The 52 and 53 mm reamer were used to gently debride the acetabular bone.  The femoral canal was also prepared and broached with the Accolade C broaches.  A trial stem size small was able to be seated appropriately.  The hip was then thoroughly irrigated.  With 3% dilute hydrogen peroxide and pulse lavage normal saline 6 L.  We then changed gown and gloves and new drapes and  instruments were utilized.  A unused set of closing instruments and implant instruments were used for the implantation.  Irrisept irrigation solution was also used to further irrigate the hip joint.   Cement 1 batch was mixed with 2 g of vancomycin , 1.2g tobramycin , and methylene blue  to help distinguish the cement from normal bone.SABRA  Appropriate version and inclination was confirmed clinically matching their bony anatomy.  The cup was held in place until the cement was fully set.   .   Another batch of cement was mixed also with methylene blue  and 2 g of vancomycin  added.  The femoral stem and ball were assembled on the back table.  Cement was placed around the neck and metaphysis of the femoral component.  The femoral component was put into place and held in place until the cement was set.  The hip was then reduced and taken through a range of motion. There was no impingement with full extension and 90 degrees external rotation.  The hip was stable at the position of sleep and with 90 degrees flexion and 60 degrees of internal rotation.   The posterior capsule was then closed with #1 PDS.  I then irrigated the hip copiously with Irrisept chlorhexidine  irrigation and with normal saline pulse lavage. Periarticular injection was not performed given the concern for infection we repaired the fascia #1 barbed suture, followed by 0 barbed suture for the subcutaneous fat.  Skin was closed with 2-0 Monocryl and 3-0 Monocryl.  Dermabond and Aquacel dressing was applied. The patient was then awakened and returned to PACU in stable and satisfactory condition.  There were no complications.   Post op recs: WB: Toe-touch weightbearing right lower extremity, posterior hip precautions Abx: Continue broad-spectrum antibiotics with vancomycin  and cefepime pending IntraOp cultures, will consult ID for definitive antibiotic recommendations  imaging: PACU pelvis Xray Dressing: dermabond and aquacell dressing DVT  prophylaxis: Eliquis  2.5 mg postop day 1 Follow up: 1 week after surgery for a wound check with Dr. Edna at [facility].  Address: [address], [place], [place] [address]  Office Phone: [phone]   Toribio Edna, MD Orthopedic Surgeon

## 2024-04-04 NOTE — Transfer of Care (Signed)
 Immediate Anesthesia Transfer of Care Note  Patient: Ashley [name]  Procedure(s) Performed: REVISION, ARTHROPLASTY, HIP (Right: Hip)  Patient Location: PACU  Anesthesia Type:General  Level of Consciousness: drowsy  Airway & Oxygen Therapy: Patient Spontanous Breathing and Patient connected to face mask oxygen  Post-op Assessment: Report given to RN and Post -op Vital signs reviewed and stable  Post vital signs: Reviewed and stable  Last Vitals:  Vitals Value Taken Time  BP 137/82 04/04/24 15:49  Temp    Pulse 85 04/04/24 15:51  Resp 10 04/04/24 15:51  SpO2 93 % 04/04/24 15:51  Vitals shown include unfiled device data.  Last Pain:  Vitals:   04/04/24 0928  TempSrc:   PainSc: 3          Complications: No notable events documented.

## 2024-04-04 NOTE — Anesthesia Procedure Notes (Signed)
 Procedure Name: Intubation Date/Time: 04/04/2024 11:50 AM  Performed by: Deeann Eva BROCKS, CRNAPre-anesthesia Checklist: Patient identified, Emergency Drugs available, Suction available and Patient being monitored Patient Re-evaluated:Patient Re-evaluated prior to induction Oxygen Delivery Method: Circle System Utilized Preoxygenation: Pre-oxygenation with 100% oxygen Induction Type: IV induction Ventilation: Mask ventilation without difficulty Laryngoscope Size: Mac and 3 Grade View: Grade II Tube type: Oral Tube size: 7.0 mm Number of attempts: 1 Airway Equipment and Method: Stylet and Oral airway Placement Confirmation: ETT inserted through vocal cords under direct vision, positive ETCO2 and breath sounds checked- equal and bilateral Secured at: 22 cm Tube secured with: Tape Dental Injury: Teeth and Oropharynx as per pre-operative assessment

## 2024-04-05 ENCOUNTER — Inpatient Hospital Stay (HOSPITAL_COMMUNITY)

## 2024-04-05 ENCOUNTER — Other Ambulatory Visit: Payer: Self-pay

## 2024-04-05 ENCOUNTER — Encounter (HOSPITAL_COMMUNITY): Payer: Self-pay | Admitting: Orthopedic Surgery

## 2024-04-05 DIAGNOSIS — T8489XA Other specified complication of internal orthopedic prosthetic devices, implants and grafts, initial encounter: Secondary | ICD-10-CM | POA: Diagnosis not present

## 2024-04-05 DIAGNOSIS — R400 Somnolence: Secondary | ICD-10-CM

## 2024-04-05 LAB — URINALYSIS, ROUTINE W REFLEX MICROSCOPIC
Bacteria, UA: NONE SEEN
Bilirubin Urine: NEGATIVE
Glucose, UA: NEGATIVE mg/dL
Hgb urine dipstick: NEGATIVE
Ketones, ur: 20 mg/dL — AB
Nitrite: NEGATIVE
Protein, ur: NEGATIVE mg/dL
Specific Gravity, Urine: 1.018 (ref 1.005–1.030)
pH: 5 (ref 5.0–8.0)

## 2024-04-05 LAB — COMPREHENSIVE METABOLIC PANEL WITH GFR
ALT: 21 U/L (ref 0–44)
AST: 38 U/L (ref 15–41)
Albumin: 2.6 g/dL — ABNORMAL LOW (ref 3.5–5.0)
Alkaline Phosphatase: 52 U/L (ref 38–126)
Anion gap: 9 (ref 5–15)
BUN: 13 mg/dL (ref 6–20)
CO2: 24 mmol/L (ref 22–32)
Calcium: 7.9 mg/dL — ABNORMAL LOW (ref 8.9–10.3)
Chloride: 105 mmol/L (ref 98–111)
Creatinine, Ser: 1.02 mg/dL — ABNORMAL HIGH (ref 0.44–1.00)
GFR, Estimated: >60 mL/min (ref >60)
Glucose, Bld: 123 mg/dL — ABNORMAL HIGH (ref 70–99)
Potassium: 3.6 mmol/L (ref 3.5–5.1)
Sodium: 138 mmol/L (ref 135–145)
Total Bilirubin: 0.7 mg/dL (ref 0.0–1.2)
Total Protein: 6 g/dL — ABNORMAL LOW (ref 6.5–8.1)

## 2024-04-05 LAB — BLOOD GAS, VENOUS
Acid-Base Excess: 3.1 mmol/L — ABNORMAL HIGH (ref 0.0–2.0)
Bicarbonate: 27 mmol/L (ref 20.0–28.0)
Drawn by: 7920
O2 Saturation: 78.3 %
Patient temperature: 38.1
pCO2, Ven: 40 mmHg — ABNORMAL LOW (ref 44–60)
pH, Ven: 7.44 — ABNORMAL HIGH (ref 7.25–7.43)
pO2, Ven: 46 mmHg — ABNORMAL HIGH (ref 32–45)

## 2024-04-05 LAB — BASIC METABOLIC PANEL WITH GFR
Anion gap: 8 (ref 5–15)
BUN: 14 mg/dL (ref 6–20)
CO2: 25 mmol/L (ref 22–32)
Calcium: 7.9 mg/dL — ABNORMAL LOW (ref 8.9–10.3)
Chloride: 103 mmol/L (ref 98–111)
Creatinine, Ser: 0.99 mg/dL (ref 0.44–1.00)
GFR, Estimated: >60 mL/min (ref >60)
Glucose, Bld: 146 mg/dL — ABNORMAL HIGH (ref 70–99)
Potassium: 3.5 mmol/L (ref 3.5–5.1)
Sodium: 136 mmol/L (ref 135–145)

## 2024-04-05 LAB — GLUCOSE, CAPILLARY: Glucose-Capillary: 122 mg/dL — ABNORMAL HIGH (ref 70–99)

## 2024-04-05 LAB — MAGNESIUM: Magnesium: 1.8 mg/dL (ref 1.7–2.4)

## 2024-04-05 LAB — CBC
HCT: 33.5 % — ABNORMAL LOW (ref 36.0–46.0)
HCT: 35.6 % — ABNORMAL LOW (ref 36.0–46.0)
Hemoglobin: 10.5 g/dL — ABNORMAL LOW (ref 12.0–15.0)
Hemoglobin: 10.9 g/dL — ABNORMAL LOW (ref 12.0–15.0)
MCH: 26 pg (ref 26.0–34.0)
MCH: 25.7 pg — ABNORMAL LOW (ref 26.0–34.0)
MCHC: 31.3 g/dL (ref 30.0–36.0)
MCHC: 30.6 g/dL (ref 30.0–36.0)
MCV: 82.9 fL (ref 80.0–100.0)
MCV: 84 fL (ref 80.0–100.0)
Platelets: 124 K/uL — ABNORMAL LOW (ref 150–400)
Platelets: 112 K/uL — ABNORMAL LOW (ref 150–400)
RBC: 4.04 MIL/uL (ref 3.87–5.11)
RBC: 4.24 MIL/uL (ref 3.87–5.11)
RDW: 16.4 % — ABNORMAL HIGH (ref 11.5–15.5)
RDW: 16.6 % — ABNORMAL HIGH (ref 11.5–15.5)
WBC: 13.1 K/uL — ABNORMAL HIGH (ref 4.0–10.5)
WBC: 12.4 K/uL — ABNORMAL HIGH (ref 4.0–10.5)
nRBC: 0 % (ref 0.0–0.2)
nRBC: 0 % (ref 0.0–0.2)

## 2024-04-05 LAB — CK: Total CK: 824 U/L — ABNORMAL HIGH (ref 38–234)

## 2024-04-05 LAB — AMMONIA: Ammonia: 25 umol/L (ref 9–35)

## 2024-04-05 MED ORDER — ACETAMINOPHEN 650 MG RE SUPP
650.0000 mg | Freq: Four times a day (QID) | RECTAL | Status: DC | PRN
  Administered 2024-04-05 – 2024-04-06 (×2): 650 mg via RECTAL
  Filled 2024-04-05 (×2): qty 1

## 2024-04-05 MED ORDER — NALOXONE HCL 0.4 MG/ML IJ SOLN
INTRAMUSCULAR | Status: AC
  Filled 2024-04-05: qty 1

## 2024-04-05 MED ORDER — NALOXONE HCL 0.4 MG/ML IJ SOLN
0.4000 mg | INTRAMUSCULAR | Status: DC | PRN
  Administered 2024-04-05: 0.4 mg via INTRAVENOUS

## 2024-04-05 MED ORDER — ROSUVASTATIN CALCIUM 10 MG PO TABS
10.0000 mg | ORAL_TABLET | Freq: Every day | ORAL | Status: DC
  Administered 2024-04-07 – 2024-04-18 (×12): 10 mg via ORAL
  Filled 2024-04-05 (×13): qty 1

## 2024-04-05 MED ORDER — LISDEXAMFETAMINE DIMESYLATE 30 MG PO CAPS
50.0000 mg | ORAL_CAPSULE | Freq: Every day | ORAL | Status: DC
  Administered 2024-04-07 – 2024-04-17 (×11): 50 mg via ORAL
  Filled 2024-04-05 (×11): qty 1

## 2024-04-05 MED ORDER — LISDEXAMFETAMINE DIMESYLATE 30 MG PO CAPS
50.0000 mg | ORAL_CAPSULE | Freq: Every day | ORAL | Status: DC
  Filled 2024-04-05: qty 1

## 2024-04-05 NOTE — Consult Note (Signed)
 Initial Consultation Note   Patient: Ashley [name] FMW:[id number] DOB: 05/06/1976 PCP: Wonda Worth SQUIBB, PA DOA: 04/04/2024 DOS: the patient was seen and examined on 04/05/2024 Primary service: Edna Toribio LABOR, MD  Referring physician: Dr. Edna Reason for consult: Lethargy  Assessment and Plan: Lethargy/altered mental status: Ongoing since last night per family and persistent through today.  Patient opens eyes to voice but nonverbal and not following commands.  Moves extremities spontaneously, primarily holds right hand over her forehead.  No obvious focal deficit.  CT head negative.  Vitals and labs relatively stable.  Has not had any sedating meds today, last narcotic was ~1800 yesterday.  Review of records and PDMP does show that she has been taking Vyvanse  consistently since March for ADHD and/or weight management.  Potentially could be Vyvanse  withdrawal which can lead to significant fatigue, hypersomnia, depression. 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  1 sample with GPC's.  On daptomycin  and ceftriaxone  per ID recommendations.  Continue management as per ID and orthopedic surgery team.  Hypertension: BP is stable.  Continue Coreg , HCTZ, losartan .  Chronic pain syndrome: Home regimen includes Percocet 10-325 q6h prn, fill consistently per PDMP.  Has not been taking oral pain meds due to mentation and will be at risk for withdrawal.  Can utilize IV Dilaudid  as already ordered for pain control/sign of withdrawal.  Depression/anxiety: Continue Wellbutrin .  TRH will continue to follow the patient.  HPI:  Ashley [name] is a 48 y.o. female with medical history significant for HTN, depression/anxiety, s/p sleeve gastrectomy, s/p right THA  2017, chronic pain syndrome who was admitted yesterday 04/04/2024 under the orthopedic service and underwent explant of right hip hardware with antibiotic spacer placement for chronic prosthetic joint infection.  For intraoperative cultures were obtained.  1 sample with GPCs.  Patient has been on broad-spectrum antibiotics with daptomycin  and ceftriaxone  per ID recommendation.  This morning patient was noted to be very lethargic, slow to respond, not following commands.  She had not received any sedating medications today.  Last narcotic given was Oxy IR 10 mg @ 1819 yesterday 7/16.  She was given Narcan  around 12 PM today without significant improvement.  Vitals have been stable.  Labs notable for WBC 13.1 and hemoglobin 10.5.  CT head was negative.  Patient remains lethargic.  Will open eyes to voice but nonverbal and not following commands.  Moving all extremities but primarily lifts her right hand to cover her forehead.  Has not received any sedating medications today.  She is on chronic Percocet at home.  Does not appear to be in opioid withdrawal.  Of note she is on Vyvanse  for ADHD and/or weight management.  Family were under the impression that she was no longer taking this but per chart and PDMP review she stopped it for a short period of time after this past December but has restarted and consistently filled it since March.  Review of Systems: unable to review all systems due to the inability of the patient to answer questions. 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   Complication of anesthesia    Depression    History of pneumonia    Hypertension    Infertility, female    Joint pain    Lumbar spondylosis    Neuromuscular disease (HCC)    numbness, paresthesias RLE, from failed lumbar back surgery   Osteoarthritis     PCOS (polycystic ovarian syndrome)    Pneumonia    2016   PONV (postoperative nausea and vomiting)    Pre-diabetes    Swelling of lower extremity    Past Surgical History:  Procedure Laterality Date   ABDOMINAL EXPOSURE N/A 02/17/2018   Procedure: ABDOMINAL EXPOSURE;  Surgeon: Oris Krystal FALCON, MD;  Location: [facility] OR;  Service: Vascular;  Laterality: N/A;   ANKLE SURGERY Bilateral    x 2   ANTERIOR LAT LUMBAR FUSION N/A 02/17/2018   Procedure: Lumbar three-four Lumbar four-five  Anterolateral lumbar interbody fusion;  Surgeon: Colon Shove, MD;  Location: [facility] OR;  Service: Neurosurgery;  Laterality: N/A;   ANTERIOR LUMBAR FUSION N/A 02/17/2018   Procedure: Lumbar five-Sacral one Anterior lumbar interbody fusion;  Surgeon: Colon Shove, MD;  Location[facility];  Service: Neurosurgery;  Laterality: N/A;   APPLICATION OF ROBOTIC ASSISTANCE FOR SPINAL PROCEDURE N/A 02/17/2018   Procedure: APPLICATION OF ROBOTIC ASSISTANCE FOR SPINAL PROCEDURE;  Surgeon: Colon Shove, MD;  Location: [facility] OR;  Service: Neurosurgery;  Laterality: N/A;   APPLICATION OF ROBOTIC ASSISTANCE FOR SPINAL PROCEDURE N/A 06/09/2018   Procedure: APPLICATION OF ROBOTIC ASSISTANCE FOR SPINAL PROCEDURE;  Surgeon: Colon Shove, MD;  Location: [facility] OR;  Service: Neurosurgery;  Laterality: N/A;   BACK SURGERY     2017  discectomy   CARPAL TUNNEL RELEASE Left 07/04/2014   Procedure: LEFT CARPAL TUNNEL RELEASE;  Surgeon: Toribio FALCON Chancy, MD;  Location: [facility];  Service: Orthopedics;  Laterality: Left;   CARPAL TUNNEL RELEASE Right 08/08/2014   Procedure: RIGHT CARPAL TUNNEL RELEASE;  Surgeon: Toribio FALCON Chancy, MD;  Location: [facility];  Service: Orthopedics;  Laterality: Right;   COLONOSCOPY  2024   DILATION AND CURETTAGE OF UTERUS     DILATION AND EVACUATION  04/02/2011   Procedure: DILATATION AND EVACUATION (D&E);  Surgeon: Alm JAYSON Cook, MD;  Location: [facility];  Service: Gynecology;  Laterality: N/A;   dvt left mid thigh   DORSAL COMPARTMENT RELEASE Left 07/04/2014   Procedure: LEFT DEQUERVAINS;  Surgeon: Toribio FALCON Chancy, MD;  Location: [facility];  Service: Orthopedics;  Laterality: Left;   ENDOSCOPIC PLANTAR FASCIOTOMY Left 02/01/2002   HARDWARE REMOVAL Right 06/09/2018   Procedure: Repositioning of Right Lumbar three Right lumbar  four pedicle screw with MAZOR;  Surgeon: Colon Shove, MD;  Location: [facility] OR;  Service: Neurosurgery;  Laterality: Right;   HYSTEROSCOPY WITH D & C  07/17/2010   with exc. endometrial polyps   KNEE ARTHROSCOPY Right 03/07/2003; 01/14/2005; 08/31/2006   KNEE ARTHROSCOPY Left 03/21/2003; 11/26/2004   LUMBAR PERCUTANEOUS PEDICLE SCREW 3 LEVEL N/A 02/17/2018   Procedure: LUMBAR PERCUTANEOUS PEDICLE SCREW PLACEMENT LUMBAR THREE-SACRAL ONE;  Surgeon: Colon Shove, MD;  Location: [facility] OR;  Service: Neurosurgery;  Laterality: N/A;   SHOULDER ARTHROSCOPY Right    TOE SURGERY Left 01/10/2003   claw toe correction 2nd, 3rd, 4th toes   TOTAL HIP ARTHROPLASTY Right 04/21/2016   Procedure: TOTAL HIP ARTHROPLASTY ANTERIOR APPROACH;  Surgeon: Toribio FALCON Chancy, MD;  [facility];  Service: Orthopedics;  Laterality: Right;  TOTAL HIP ARTHROPLASTY WITH HARDWARE REMOVAL Right 04/04/2024   Procedure: REVISION, ARTHROPLASTY, HIP;  Surgeon: Edna Toribio LABOR, MD;  Location: [facility];  Service: Orthopedics;  Laterality: Right;   TOTAL KNEE ARTHROPLASTY Left 01/21/2010   TOTAL KNEE ARTHROPLASTY Right 08/26/2008   UPPER GI ENDOSCOPY N/A 04/14/2021   Procedure: UPPER GI ENDOSCOPY;  Surgeon: Gladis Cough, MD;  Location: [facility]S;  Service: General;  Laterality: N/A;   Social History:  reports that she has never smoked. She has never used smokeless tobacco. She reports that she does not drink alcohol  and does not use drugs.  Allergies  Allergen Reactions   Gadolinium Derivatives Nausea And Vomiting and Other (See Comments)    Pt describes this happens every time she gets  gado even with slow injection    Codeine Itching, Nausea Only and Other (See Comments)    Tolerates oxycodone     Duloxetine     altered mental status    Family History  Problem Relation Age of Onset   Diabetes Father    Cancer Father        COLON   Heart disease Father    Kidney disease Father        dialysis   High blood pressure Father    Heart disease Sister    Diabetes Sister    Heart failure Sister    Heart failure Sister    Heart disease Sister    Cancer Maternal Grandmother        UTERINE   Cancer Paternal Grandmother        breast   Breast cancer Paternal Grandmother    Breast cancer Cousin     Prior to Admission medications   Medication Sig Start Date End Date Taking? Authorizing Provider  acetaminophen  (TYLENOL ) 500 MG tablet Take 1 tablet (500 mg total) by mouth every 12 (twelve) hours. 04/04/24 05/04/24 Yes Renae Bernarda HERO, PA-C  apixaban  (ELIQUIS ) 2.5 MG TABS tablet Take 1 tablet (2.5 mg total) by mouth 2 (two) times daily. 04/04/24  Yes Cockerham, Alicia M, PA-C  baclofen  (LIORESAL ) 10 MG tablet Take 1 tablet by mouth twice a day 02/27/24  Yes   buPROPion  (WELLBUTRIN  XL) 150 MG 24 hr tablet Take 150 mg by mouth daily.   Yes [provider]  calcium  carbonate (TUMS EX) 750 MG chewable tablet Chew 2 tablets by mouth daily.   Yes [provider]  carvedilol  (COREG ) 6.25 MG tablet Take 6.25 mg by mouth 2 (two) times daily with a meal. 01/20/24  Yes [provider]  EPINEPHrine  0.3 mg/0.3 mL IJ SOAJ injection Inject 3 mLs into the muscle once. 05/26/18  Yes [provider]  ibuprofen  (ADVIL ) 800 MG tablet Take 800 mg by mouth every 8 (eight) hours as needed for moderate pain (pain score 4-6).   Yes [provider]  levocetirizine (XYZAL ALLERGY 24HR) 5 MG tablet Take 5 mg by mouth daily as needed for allergies.   Yes [provider]  lidocaine  (LIDODERM ) 5 % Apply 3 patches to skin once daily as directed-remove after  12 hours Patient taking differently: Place 1-3 patches onto the skin daily as needed (pain). 02/27/24  Yes   lisdexamfetamine (VYVANSE ) 50 MG capsule Take 50 mg by mouth daily.   Yes [provider]  losartan -hydrochlorothiazide  (HYZAAR) 100-12.5 MG tablet Take 1 tablet by mouth daily. 01/29/21  Yes [provider]  methocarbamol  (ROBAXIN ) 500 MG tablet Take 1 tablet (500 mg total) by mouth every 8 (eight) hours  as needed for up to 10 days for muscle spasms. 04/04/24 04/14/24 Yes Renae Bernarda HERO, PA-C  Multiple Vitamins-Minerals (BARIATRIC MULTIVITAMINS/IRON PO) Take 1 tablet by mouth daily.   Yes [provider]  omeprazole  (PRILOSEC) 40 MG capsule Take 1 capsule (40 mg total) by mouth daily for 21 days. 04/04/24 04/25/24 Yes Cockerham, Alicia M, PA-C  ondansetron  (ZOFRAN ) 4 MG tablet Take 1 tablet (4 mg total) by mouth every 8 (eight) hours as needed for up to 14 days for nausea or vomiting. 04/04/24 04/18/24 Yes Renae Bernarda HERO, PA-C  oxyCODONE  (ROXICODONE ) 5 MG immediate release tablet Take 0.5-1 tablets (2.5-5 mg total) by mouth every 6 (six) hours as needed for up to 7 days for breakthrough pain. 04/04/24 04/11/24 Yes Cockerham, Alicia M, PA-C  oxyCODONE -acetaminophen  (PERCOCET) 10-325 MG tablet Take 1 tablet by mouth every 6 (six) hours as directed 01/30/24  Yes   oxyCODONE -acetaminophen  (PERCOCET) 10-325 MG tablet Take 1 tablet by mouth every 6 (six) hours as needed 02/27/24  Yes   polyethylene glycol (MIRALAX ) 17 g packet Take 17 g by mouth daily. 04/04/24  Yes Renae Bernarda HERO, PA-C  pregabalin  (LYRICA ) 75 MG capsule Take 1 capsule by mouth three times a day 02/27/24  Yes   rosuvastatin  (CRESTOR ) 10 MG tablet Take 1 tablet (10 mg total) by mouth daily. 05/16/23 04/04/24 Yes Tolia, Sunit, DO  Vitamin D -Vitamin K (K2 PLUS D3 PO) Take 2 capsules by mouth daily.   Yes [provider]  zolpidem  (AMBIEN ) 10 MG tablet Take 10 mg by mouth at bedtime as needed for sleep.  03/12/24  Yes [provider]  Albuterol  Sulfate (PROAIR  RESPICLICK) 108 (90 Base) MCG/ACT AEPB Inhale 1 puff into the lungs every 4 (four) hours as needed. 01/10/22   Crain, Whitney L, PA  baclofen  (LIORESAL ) 10 MG tablet Take 1 tablet by mouth twice a day Patient not taking: Reported on 03/19/2024 01/02/24     docusate sodium  (COLACE) 100 MG capsule Take 200 mg by mouth daily as needed for mild constipation.    [provider]  furosemide  (LASIX ) 20 MG tablet Take 20 mg by mouth daily as needed for edema. 02/18/21   [provider]  lidocaine  (LIDODERM ) 5 % Place 3 patches onto the skin as directed. Patient not taking: Reported on 03/19/2024 01/02/24     oxyCODONE -acetaminophen  (PERCOCET) 10-325 MG tablet Take 1 tablet by mouth every six hours as directed 03/26/24     pregabalin  (LYRICA ) 75 MG capsule Take 1 capsule (75 mg total) by mouth 3 (three) times daily. Patient not taking: Reported on 03/19/2024 01/02/24       Physical Exam: Vitals:   04/05/24 0130 04/05/24 0642 04/05/24 0846 04/05/24 1047  BP: 111/62 127/73 121/78 126/78  Pulse: (!) 105 (!) 108 100 (!) 107  Resp: 18 18 20 15   Temp: 100.1 F (37.8 C) 99.3 F (37.4 C) 99.1 F (37.3 C)   TempSrc: Oral Oral Oral   SpO2: 98% 95% 98% 100%  Weight:      Height:       General: resting supine in bed, opens eyes to voice but not verbal or following commands HEENT: Bilateral pupils dilated, EOMI, no scleral icterus Cardiac: RRR, no rubs, murmurs or gallops Pulm: clear to auscultation anteriorly Abd: soft, no obvious tenderness elicited with palpation Ext: warm and well perfused, s/p right hip explant with wound dressing in place Neuro: Somnolent, opens eyes to voice.  Nonverbal, not following commands.  Moving extremities spontaneously.  Strength RLE limited due to recent right hip surgery.  Data Reviewed:   There are no new results to review at this time.    Family Communication: Mother and additional family  members at bedside Primary team communication: Dr. Edna Thank you very much for involving us  in the care of your patient.  Author: Jorie Blanch, MD 04/05/2024 7:38 PM  For on call review [email or web address].

## 2024-04-05 NOTE — Progress Notes (Signed)
 Around 0830 I noticed that the patient was extremely drowsy. Further assessment reveled that patient was slow to respond and unable to follow commands. Vitals were taken (see flowsheets). Called Marchwiany,MD to share findings and was instructed to continue to closely monitor patient for the next two hours, no new orders. Rapid Response nurse was also informed. RR Nurse assessed patient and saw no immediate reason to intervene, agreed that I should closely monitor for about 2 hours. Reassessed patients virals at 1047 (see flowsheets) and also evaluated patient. Noticed that the patient is still very drowsy and fails to respond to commands in a timely fashion. MD notified of update

## 2024-04-05 NOTE — TOC Transition Note (Signed)
 Transition of Care [facility]) - Discharge Note   Patient Details  Name: Ashley [name] MRN: [id number] Date of Birth: [birth year]  [facility] CM/SW Contact:  NORMAN ASPEN, LCSW Phone Number: 04/05/2024, 1:27 PM   Clinical Narrative:    Met with pt/ family and confirming pt has received RW to room via [employer].  HHPT prearranged with [facility] via ortho MD office prior to surgery.  At this time, no further TOC needs, however, available to assist further if it's determined to be a need for home IV abx coverage.  Will continue to monitor.   Final next level of care: Home w Home Health Services Barriers to Discharge: Continued Medical Work up   Patient Goals and CMS Choice Patient states their goals for this hospitalization and ongoing recovery are:: return home          Discharge Placement                       Discharge Plan and Services Additional resources added to the After Visit Summary for                  DME Arranged: Walker rolling DME Agency: [employer]       HH Arranged: PT HH Agency: [facility]        Social Drivers of Health (SDOH) Interventions SDOH Screenings   Food Insecurity: Patient Declined (04/04/2024)  Housing: Unknown (04/04/2024)  Transportation Needs: Patient Declined (04/04/2024)  Utilities: Patient Declined (04/04/2024)  Depression (PHQ2-9): Low Risk  (07/08/2020)  Tobacco Use: Low Risk  (04/04/2024)     Readmission Risk Interventions    04/05/2024    1:25 PM  Readmission Risk Prevention Plan  Post Dischage Appt Complete  Medication Screening Complete  Transportation Screening Complete

## 2024-04-05 NOTE — Progress Notes (Signed)
 PT Cancellation Note  Patient Details Name: Ashley [name] MRN: [id number] DOB: 1976/05/17   Cancelled Treatment:     PT deferred this pm.  RN advises that pt has received Narcan  with min change in lethargy and that CT has been ordered.  Will follow.   Hanford Lust 04/05/2024, 1:30 PM

## 2024-04-05 NOTE — Anesthesia Postprocedure Evaluation (Signed)
 Anesthesia Post Note  Patient: Ashley [name]  Procedure(s) Performed: REVISION, ARTHROPLASTY, HIP (Right: Hip)     Patient location during evaluation: PACU Anesthesia Type: General Level of consciousness: awake and alert Pain management: pain level controlled Vital Signs Assessment: post-procedure vital signs reviewed and stable Respiratory status: spontaneous breathing, nonlabored ventilation, respiratory function stable and patient connected to nasal cannula oxygen Cardiovascular status: blood pressure returned to baseline and stable Postop Assessment: no apparent nausea or vomiting Anesthetic complications: no   No notable events documented.  Last Vitals:  Vitals:   04/05/24 0846 04/05/24 1047  BP: 121/78 126/78  Pulse: 100 (!) 107  Resp: 20 15  Temp: 37.3 C   SpO2: 98% 100%    Last Pain:  Vitals:   04/05/24 1026  TempSrc:   PainSc: Asleep                 Garnette DELENA Gab

## 2024-04-05 NOTE — Progress Notes (Signed)
 PT Cancellation Note  Patient Details Name: Ashley [name] MRN: [id number] DOB: 02-Dec-1975   Cancelled Treatment:     PT order received but eval deferred this am - RN advises pt very lethargic and responding to pain only.  Rapid response to assess.  Will follow.   [name] 04/05/2024, 10:28 AM

## 2024-04-05 NOTE — Progress Notes (Signed)
 Ashley [name] is very lethargic all morning and minimally following commands. Has not received any pain meds today that could cause oversedation. Will Recheck labs and get a head CT to further evaluate. She is moving all extremities and responding to painful stimuli.

## 2024-04-05 NOTE — Plan of Care (Signed)
  Problem: Education: Goal: Knowledge of General Education information will improve Description: Including pain rating scale, medication(s)/side effects and non-pharmacologic comfort measures Outcome: Progressing   Problem: Health Behavior/Discharge Planning: Goal: Ability to manage health-related needs will improve Outcome: Progressing   Problem: Clinical Measurements: Goal: Ability to maintain clinical measurements within normal limits will improve Outcome: Progressing Goal: Will remain free from infection Outcome: Progressing Goal: Diagnostic test results will improve Outcome: Progressing Goal: Respiratory complications will improve Outcome: Progressing Goal: Cardiovascular complication will be avoided Outcome: Progressing   Problem: Activity: Goal: Risk for activity intolerance will decrease Outcome: Progressing   Problem: Nutrition: Goal: Adequate nutrition will be maintained Outcome: Progressing   Problem: Coping: Goal: Level of anxiety will decrease Outcome: Progressing   Problem: Elimination: Goal: Will not experience complications related to bowel motility Outcome: Progressing Goal: Will not experience complications related to urinary retention Outcome: Progressing   Problem: Pain Managment: Goal: General experience of comfort will improve and/or be controlled Outcome: Progressing   Problem: Safety: Goal: Ability to remain free from injury will improve Outcome: Progressing   Problem: Skin Integrity: Goal: Risk for impaired skin integrity will decrease Outcome: Progressing   Problem: Education: Goal: Knowledge of the prescribed therapeutic regimen will improve Outcome: Progressing Goal: Understanding of discharge needs will improve Outcome: Progressing Goal: Individualized Educational Video(s) Outcome: Completed/Met   Problem: Activity: Goal: Ability to avoid complications of mobility impairment will improve Outcome: Progressing Goal: Ability to  tolerate increased activity will improve Outcome: Progressing   Problem: Clinical Measurements: Goal: Postoperative complications will be avoided or minimized Outcome: Progressing   Problem: Pain Management: Goal: Pain level will decrease with appropriate interventions Outcome: Progressing   Problem: Skin Integrity: Goal: Will show signs of wound healing Outcome: Progressing

## 2024-04-05 NOTE — Progress Notes (Signed)
 PHARMACY CONSULT NOTE FOR:  OUTPATIENT  PARENTERAL ANTIBIOTIC THERAPY (OPAT)  Indication: R-hip PJI Regimen: Daptomycin  700 mg IV every 24 hours  End date: 05/16/24 (6 weeks from OR 04/04/24)  IV antibiotic discharge orders are pended. To discharging provider:  please sign these orders via discharge navigator,  Select New Orders & click on the button choice - Manage This Unsigned Work.    Thank you for allowing pharmacy to be a part of this patient's care.  Almarie Lunger, PharmD, BCPS, BCIDP Infectious Diseases Clinical Pharmacist 04/06/2024 11:43 AM   **Pharmacist phone directory can now be found on [email or web address] ([email or web address]).  Listed under [facility].

## 2024-04-05 NOTE — Progress Notes (Signed)
     Subjective:  Patient reports pain as moderate.  Family at bedside.  Hemoglobin 10.5 today.  Denies distal numbness and tingling.  Discussed plan for mobilization with physical therapy today and weightbearing restrictions postoperatively.  Objective:   VITALS:   Vitals:   04/04/24 1735 04/04/24 1949 04/04/24 2222 04/05/24 0130  BP:  (!) 152/97 (!) 158/99 111/62  Pulse:  92 98 (!) 105  Resp:  14 14 18   Temp:  98.8 F (37.1 C) 97.8 F (36.6 C) 100.1 F (37.8 C)  TempSrc:  Oral Oral Oral  SpO2:  92% 96% 98%  Weight:      Height: 5' 11 (1.803 m)       Sensation intact distally Intact pulses distally Dorsiflexion/Plantar flexion intact Incision: dressing C/D/I Compartment soft    Lab Results  Component Value Date   WBC 13.1 (H) 04/05/2024   HGB 10.5 (L) 04/05/2024   HCT 33.5 (L) 04/05/2024   MCV 82.9 04/05/2024   PLT 124 (L) 04/05/2024   BMET    Component Value Date/Time   NA 136 04/05/2024 0532   NA 139 10/21/2023 1054   K 3.5 04/05/2024 0532   CL 103 04/05/2024 0532   CO2 25 04/05/2024 0532   GLUCOSE 146 (H) 04/05/2024 0532   BUN 14 04/05/2024 0532   BUN 8 10/21/2023 1054   CREATININE 0.99 04/05/2024 0532   CALCIUM  7.9 (L) 04/05/2024 0532   CALCIUM  8.3 (L) 08/28/2010 0525   EGFR 91 10/21/2023 1054   GFRNONAA >60 04/05/2024 0532    Xray: Antibiotic spacer components in good position no adverse features  Assessment/Plan: 1 Day Post-Op   Principal Problem:   Failed total hip arthroplasty (HCC)  Status post right explant antibiotic spacer placement for chronic prosthetic joint infection  Post op recs: WB: 20% weightbearing right lower extremity, posterior hip precautions Abx: Currently advised broad-spectrum antibiotics with daptomycin  and ceftriaxone  per infectious disease.  Recs are appreciated.  IntraOp cultures with 1 out of 4 showing rare gram-positive cocci we will continue to follow cultures for growth. imaging: PACU pelvis Xray Dressing:  dermabond and aquacell dressing DVT prophylaxis: Eliquis  2.5 mg postop day 1 Follow up: 1 week after surgery for a wound check with Dr. Edna at [facility].  Address: [address], [place], [place] [address]  Office Phone: [phone]   TORIBIO DELENA EDNA 04/05/2024, 6:31 AM   TORIBIO Edna, MD  Contact information:   [phone] 7am-5pm epic message Dr. Edna, or call office for patient follow up: [phone] After hours and holidays please check [email or web address] for group call information for [facility]

## 2024-04-05 NOTE — Consult Note (Addendum)
 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 On exam patient appears to be following commands and appears she is having issue with sorethroat/conversation. 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  No recent procedure dental/medical. No recent infection No recent abx  Office aspirate as w/u per ortho concerning for infection and admitted 7/16 for 2 stage repair  Reviewed operative note Cx so far growing gpc  On ceftriaxone /dapto post op  Since surgery mother concerned patient not able to speak and mentating not baseline      Family History  Problem Relation Age of Onset   Diabetes Father    Cancer Father        COLON   Heart disease Father    Kidney disease Father        dialysis   High blood pressure Father    Heart disease Sister    Diabetes Sister    Heart failure Sister    Heart failure Sister    Heart disease Sister    Cancer Maternal Grandmother        UTERINE   Cancer Paternal Grandmother        breast   Breast cancer Paternal Grandmother    Breast cancer Cousin     Social History   Tobacco Use   Smoking status: Never   Smokeless tobacco: Never  Vaping Use   Vaping status: Never Used  Substance Use Topics   Alcohol  use: No    Alcohol /week: 0.0 standard drinks of alcohol    Drug use: No    Allergies  Allergen Reactions   Gadolinium Derivatives Nausea And Vomiting and Other (See Comments)    Pt describes this happens every time she gets gado even with slow injection    Codeine Itching, Nausea Only and Other (See Comments)  Tolerates oxycodone     Duloxetine     altered mental status    Review of Systems: ROS All Other ROS was negative, except mentioned above   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   Complication of anesthesia    Depression    History of pneumonia    Hypertension    Infertility, female    Joint pain    Lumbar spondylosis     Neuromuscular disease (HCC)    numbness, paresthesias RLE, from failed lumbar back surgery   Osteoarthritis    PCOS (polycystic ovarian syndrome)    Pneumonia    2016   PONV (postoperative nausea and vomiting)    Pre-diabetes    Swelling of lower extremity        Scheduled Meds:  acetaminophen   1,000 mg Oral Q6H   apixaban   2.5 mg Oral Q12H   buPROPion   150 mg Oral Daily   carvedilol   6.25 mg Oral BID WC   docusate sodium   100 mg Oral BID   hydrochlorothiazide   12.5 mg Oral Daily   And   losartan   100 mg Oral Daily   naloxone        pantoprazole   40 mg Oral Daily   pregabalin   75 mg Oral TID   [START ON 04/06/2024] rosuvastatin   10 mg Oral Daily   senna  1 tablet Oral BID   Continuous Infusions:  cefTRIAXone  (ROCEPHIN )  IV 2 g (04/04/24 1818)   DAPTOmycin  700 mg (04/04/24 1814)   lactated ringers  75 mL/hr at 04/05/24 0615   PRN Meds:.acetaminophen , albuterol , diphenhydrAMINE , furosemide , HYDROmorphone  (DILAUDID ) injection, lidocaine , loratadine , menthol -cetylpyridinium **OR** phenol, methocarbamol  **OR** methocarbamol  (ROBAXIN ) injection, naloxone , naloxone , ondansetron  **OR** ondansetron  (ZOFRAN ) IV, oxyCODONE , polyethylene glycol, zolpidem    OBJECTIVE: Blood pressure 126/78, pulse (!) 107, temperature 99.1 F (37.3 C), temperature source Oral, resp. rate 15, height 5' 11 (1.803 m), weight 117 kg, SpO2 100%.  Physical Exam  General/constitutional: no distress, pleasant, not conversant but appears to follows simple commands HEENT: Normocephalic, PER, Conj Clear, EOMI, Oropharynx clear Neck supple CV: rrr no mrg Lungs: clear to auscultation, normal respiratory effort Abd: Soft, Nontender Ext: no edema Skin: No Rash Neuro: nonfocal; not conversant; follows simple commands; moving upper extremities MSK: right hip dressing in place   Lab Results Lab Results  Component Value Date   WBC 13.1 (H) 04/05/2024   HGB 10.5 (L) 04/05/2024   HCT 33.5 (L) 04/05/2024   MCV  82.9 04/05/2024   PLT 124 (L) 04/05/2024    Lab Results  Component Value Date   CREATININE 0.99 04/05/2024   BUN 14 04/05/2024   NA 136 04/05/2024   K 3.5 04/05/2024   CL 103 04/05/2024   CO2 25 04/05/2024    Lab Results  Component Value Date   ALT 21 03/22/2024   AST 21 03/22/2024   ALKPHOS 72 03/22/2024   BILITOT 1.0 03/22/2024      Microbiology: Recent Results (from the past 240 hours)  Aerobic/Anaerobic Culture w Gram Stain (surgical/deep wound)     Status: None (Preliminary result)   Collection Time: 04/04/24 12:38 PM   Specimen: Synovial, Right Hip; Body Fluid  Result Value Ref Range Status   Specimen Description   Final    TISSUE Performed at [facility],  [address] [place], [place] [address]    Special Requests R HIP SAMPLE 1A  Final   Gram Stain NO WBC SEEN NO ORGANISMS SEEN   Final   Culture   Final    NO GROWTH < 12 HOURS Performed at [facility], [address] [place], [place] [address]    Report Status PENDING  Incomplete  Aerobic/Anaerobic Culture w Gram Stain (surgical/deep wound)     Status: None (Preliminary result)   Collection Time: 04/04/24 12:46 PM   Specimen: Synovial, Right Hip; Body Fluid  Result Value Ref Range Status   Specimen Description   Final    TISSUE Performed at [facility], [address] [place], [place] [address]    Special Requests R HIP SAMPLE B HOLD 2WKS  Final   Gram Stain NO WBC SEEN NO ORGANISMS SEEN   Final   Culture   Final    NO GROWTH < 12 HOURS Performed at [facility], [address] [place], [place] [address]    Report Status PENDING  Incomplete  Aerobic/Anaerobic Culture w Gram Stain (surgical/deep wound)     Status: None (Preliminary result)   Collection Time: 04/04/24 12:49 PM   Specimen: Synovial, Right Hip; Body Fluid  Result Value Ref Range Status   Specimen Description   Final    TISSUE Performed at [facility], [address], [place], [place] [address]    Special Requests   Final    R HIP Performed at [facility], [address] [place], [place] [address]    Gram Stain NO WBC SEEN RARE GRAM POSITIVE COCCI   Final   Culture   Final    NO GROWTH < 12 HOURS Performed at [facility], [address], [place], [place] [address]    Report Status PENDING  Incomplete  Aerobic/Anaerobic Culture w Gram Stain (surgical/deep wound)     Status: None (Preliminary result)   Collection Time: 04/04/24  1:31 PM   Specimen: Path Tissue  Result Value Ref Range Status   Specimen Description   Final    TISSUE Performed at [facility], [address] [place], [place] [address]    Special Requests R HIP SAMPLE D HOLD 14 DAYSQ  Final   Gram Stain   Final    FEW WBC PRESENT,BOTH PMN AND MONONUCLEAR NO ORGANISMS SEEN    Culture   Final    NO GROWTH < 12 HOURS Performed at [facility], [address] [place], [place] [address]    Report Status PENDING  Incomplete     Serology:    Imaging: If present, new imagings (plain films, ct scans, and mri) have been personally visualized and interpreted; radiology reports have been reviewed. Decision making incorporated into the Impression / Recommendations.  7/16 xray right hip FINDINGS:   BONES AND JOINTS: Postoperative change of the right hip. No acute fracture or dislocation. The procedure performed on the right hip was an explantation and debridement of the right hip with implantation of antibiotic articulating hip spacer.   SOFT TISSUES: Tubal ligation clips in the pelvis.   IMPRESSION: 1. Postoperative changes in the right hip.  Constance Ashley Passer, MD [facility] [phone] pager    04/05/2024, 12:49 PM

## 2024-04-06 DIAGNOSIS — G9340 Encephalopathy, unspecified: Secondary | ICD-10-CM | POA: Diagnosis not present

## 2024-04-06 DIAGNOSIS — D696 Thrombocytopenia, unspecified: Secondary | ICD-10-CM

## 2024-04-06 DIAGNOSIS — T84018D Broken internal joint prosthesis, other site, subsequent encounter: Secondary | ICD-10-CM

## 2024-04-06 DIAGNOSIS — Z96649 Presence of unspecified artificial hip joint: Secondary | ICD-10-CM | POA: Diagnosis not present

## 2024-04-06 DIAGNOSIS — T8450XA Infection and inflammatory reaction due to unspecified internal joint prosthesis, initial encounter: Secondary | ICD-10-CM | POA: Diagnosis not present

## 2024-04-06 DIAGNOSIS — B9689 Other specified bacterial agents as the cause of diseases classified elsewhere: Secondary | ICD-10-CM | POA: Diagnosis not present

## 2024-04-06 DIAGNOSIS — T8489XA Other specified complication of internal orthopedic prosthetic devices, implants and grafts, initial encounter: Secondary | ICD-10-CM | POA: Diagnosis not present

## 2024-04-06 LAB — BASIC METABOLIC PANEL WITH GFR
Anion gap: 10 (ref 5–15)
BUN: 12 mg/dL (ref 6–20)
CO2: 23 mmol/L (ref 22–32)
Calcium: 7.8 mg/dL — ABNORMAL LOW (ref 8.9–10.3)
Chloride: 106 mmol/L (ref 98–111)
Creatinine, Ser: 0.61 mg/dL (ref 0.44–1.00)
GFR, Estimated: >60 mL/min (ref >60)
Glucose, Bld: 120 mg/dL — ABNORMAL HIGH (ref 70–99)
Potassium: 3.5 mmol/L (ref 3.5–5.1)
Sodium: 139 mmol/L (ref 135–145)

## 2024-04-06 LAB — CBC
HCT: 31.1 % — ABNORMAL LOW (ref 36.0–46.0)
Hemoglobin: 9.9 g/dL — ABNORMAL LOW (ref 12.0–15.0)
MCH: 26.3 pg (ref 26.0–34.0)
MCHC: 31.8 g/dL (ref 30.0–36.0)
MCV: 82.5 fL (ref 80.0–100.0)
Platelets: 112 K/uL — ABNORMAL LOW (ref 150–400)
RBC: 3.77 MIL/uL — ABNORMAL LOW (ref 3.87–5.11)
RDW: 16.5 % — ABNORMAL HIGH (ref 11.5–15.5)
WBC: 10.6 K/uL — ABNORMAL HIGH (ref 4.0–10.5)
nRBC: 0 % (ref 0.0–0.2)

## 2024-04-06 MED ORDER — SODIUM CHLORIDE 0.9% FLUSH
10.0000 mL | Freq: Two times a day (BID) | INTRAVENOUS | Status: DC
  Administered 2024-04-06 – 2024-04-09 (×4): 10 mL

## 2024-04-06 MED ORDER — ENOXAPARIN SODIUM 40 MG/0.4ML IJ SOSY
40.0000 mg | PREFILLED_SYRINGE | INTRAMUSCULAR | Status: DC
  Administered 2024-04-07 – 2024-04-15 (×9): 40 mg via SUBCUTANEOUS
  Filled 2024-04-06 (×9): qty 0.4

## 2024-04-06 MED ORDER — CHLORHEXIDINE GLUCONATE CLOTH 2 % EX PADS
6.0000 | MEDICATED_PAD | Freq: Every day | CUTANEOUS | Status: DC
  Administered 2024-04-06 – 2024-04-18 (×13): 6 via TOPICAL

## 2024-04-06 MED ORDER — SODIUM CHLORIDE 0.9 % IV SOLN
3.0000 g | Freq: Four times a day (QID) | INTRAVENOUS | Status: DC
  Administered 2024-04-06 – 2024-04-09 (×12): 3 g via INTRAVENOUS
  Filled 2024-04-06 (×15): qty 8

## 2024-04-06 MED ORDER — SODIUM CHLORIDE 0.9% FLUSH: 10.0000 mL | INTRAVENOUS | Status: DC | PRN

## 2024-04-06 NOTE — Progress Notes (Signed)
 OT Cancellation Note  Patient Details Name: Ashley [name] MRN: [id number] DOB: 07-15-1976   Cancelled Treatment:    Reason Eval/Treat Not Completed: Patient's level of consciousness  OT attempted visit for initial evaluation. As per nursing and OT observation upon contact, patient remains unable to maintain level of arousal for safe therapy initiation. OT will continue to follow as schedule and patient ability allows.   [facility] OT/L Acute Rehabilitation Department  [phone]   Geni CHRISTELLA Andreas 04/06/2024, 2:39 PM

## 2024-04-06 NOTE — Plan of Care (Signed)

## 2024-04-06 NOTE — Progress Notes (Signed)
     Subjective: Patient remains lethargic but following commands and more alert than yesterday. Has not taken any PO meds since day of surgery. Did not participate with PT. Family at bedside. Medicine consulted for workup and recs are appreciated. Encouraged her to take her PO meds today. Encourage incentive spirometry and hopefully mobilization with PT.   Objective:   VITALS:   Vitals:   04/05/24 1047 04/05/24 1954 04/05/24 2159 04/06/24 0506  BP: 126/78  139/85 (!) 158/80  Pulse: (!) 107     Resp: 15  16 16   Temp:  (!) 100.5 F (38.1 C) 99 F (37.2 C) 100.3 F (37.9 C)  TempSrc:  Oral    SpO2: 100%  98% 96%  Weight:      Height:        Sensation intact distally Intact pulses distally Dorsiflexion/Plantar flexion intact Incision: dressing C/D/I Compartment soft    Lab Results  Component Value Date   WBC 10.6 (H) 04/06/2024   HGB 9.9 (L) 04/06/2024   HCT 31.1 (L) 04/06/2024   MCV 82.5 04/06/2024   PLT 112 (L) 04/06/2024   BMET    Component Value Date/Time   NA 139 04/06/2024 0319   NA 139 10/21/2023 1054   K 3.5 04/06/2024 0319   CL 106 04/06/2024 0319   CO2 23 04/06/2024 0319   GLUCOSE 120 (H) 04/06/2024 0319   BUN 12 04/06/2024 0319   BUN 8 10/21/2023 1054   CREATININE 0.61 04/06/2024 0319   CALCIUM  7.8 (L) 04/06/2024 0319   CALCIUM  8.3 (L) 08/28/2010 0525   EGFR 91 10/21/2023 1054   GFRNONAA >60 04/06/2024 0319    Xray: Antibiotic spacer components in good position no adverse features  Assessment/Plan: 2 Days Post-Op   Principal Problem:   Failed total hip arthroplasty (HCC)  Status post right explant antibiotic spacer placement for chronic prosthetic joint infection 7/16  Altered mental status: CT head negative, vitals and labs reassuring. Medicine c/s recs appreciated.  Post op recs: WB: 20% weightbearing right lower extremity, posterior hip precautions Abx: Currently advised broad-spectrum antibiotics with daptomycin  and ceftriaxone  per  infectious disease.  Recs are appreciated.  IntraOp cultures with 1 out of 4 showing rare gram-positive cocci we will continue to follow cultures for growth. imaging: PACU pelvis Xray Dressing: dermabond and aquacell dressing DVT prophylaxis: Eliquis  2.5 mg postop day 1, Did not take PO meds POD1 so will start lovenox  today, transition back to Eliquis  once taking PO meds consistently. Follow up: 1 week after surgery for a wound check with Dr. Edna at [facility].  Address: [address], [place], [place] [address]  Office Phone: [phone]   TORIBIO DELENA EDNA 04/06/2024, 6:59 AM   TORIBIO Edna, MD  Contact information:   [phone] 7am-5pm epic message Dr. Edna, or call office for patient follow up: [phone] After hours and holidays please check [email or web address] for group call information for [facility]

## 2024-04-06 NOTE — Progress Notes (Signed)
 Progress Note   Patient: Ashley [name] FMW:[id number] DOB: [birth year] DOA: 04/04/2024     2 DOS: the patient was seen and examined on 04/06/2024   Brief hospital course: 48 year old woman PMH sleeve gastrectomy, multiple joint surgeries, chronic pain syndrome, admitted for semielective right THA explant for chronic prosthetic joint infection.  Status post surgery 7/16.  7/17 patient developed lethargy and hospital service was consulted for further evaluation.  Primary service: Orthopedics Admit 7/16 for surgery  Procedures/Events 7/16 Right hip stage I revision explantation and debridement of right hip with implantation of antibiotic articulating hip spacer   Assessment and Plan: Lethargy Acute encephalopathy Workup unrevealing including CT head negative, serum ammonia within normal limits, BMP unremarkable. CXR left perihilar infiltrate but no clinical signs or symptoms of infection. She follows simple commands, moves all extremities, recognized her mother.  When lifting either arm, she maintains tone and strength and does not allow the arm to fall or the hand to hit her head. Given negative screening evaluation and clinical appearance, suspect nonorganic etiology.  Differential does include postanesthesia effects although the patient has had multiple surgeries in the past and never had trouble with anesthesia. Nonfocal examination, discussed that MRI of the brain could be pursued for completeness but sister and mother want to wait another 24 hours to have this I have seen clear improvement. Will continue to monitor.   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   Thrombocytopenia Normocytic anemia Thrombocytopenia likely perioperative in nature Hemoglobin stable.  Can follow-up as an outpatient.  Hypertension: BP is stable.  Continue Coreg , HCTZ, losartan  when taking p.o.   Chronic pain syndrome: Home regimen  includes Percocet 10-325 q6h prn, fill consistently per PDMP.  Has not been taking oral pain meds due to mentation and will be at risk for withdrawal.  Can utilize IV Dilaudid  as already ordered for pain control/sign of withdrawal.   Depression/anxiety: Continue Wellbutrin .      Subjective:  Better today per mother and sister at bedside Wakes up, recognized her mother Still sleepy and not eating though  Pt arouses, makes eye contact, answers a few brief questions but does not offer history.  Physical Exam: Vitals:   04/05/24 1954 04/05/24 2159 04/06/24 0506 04/06/24 1344  BP:  139/85 (!) 158/80 (!) 143/97  Pulse:    99  Resp:  16 16 20   Temp: (!) 100.5 F (38.1 C) 99 F (37.2 C) 100.3 F (37.9 C) 98.9 F (37.2 C)  TempSrc: Oral   Oral  SpO2:  98% 96% 99%  Weight:      Height:       Physical Exam Vitals reviewed.  Constitutional:      General: She is not in acute distress.    Appearance: She is not ill-appearing or toxic-appearing.  Cardiovascular:     Rate and Rhythm: Normal rate and regular rhythm.     Heart sounds: No murmur heard. Pulmonary:     Effort: Pulmonary effort is normal. No respiratory distress.     Breath sounds: No wheezing, rhonchi or rales.  Neurological:     Mental Status: She is alert.  Psychiatric:        Mood and Affect: Mood normal.        Behavior: Behavior normal.     Data Reviewed: Basic metabolic panel unremarkable WBC 10.6, hemoglobin stable 9.9, platelets stable 112  Family Communication: mother, sister at bedside  Disposition: Status is: Inpatient Remains inpatient  appropriate because: s/p surgery, confused     Time spent: 35 minutes  Author: Toribio Door, MD 04/06/2024 4:55 PM  For on call review [email or web address].

## 2024-04-06 NOTE — Hospital Course (Addendum)
 48 year old woman PMH sleeve gastrectomy, multiple joint surgeries, chronic pain syndrome, admitted for semielective right THA explant for chronic prosthetic joint infection.  Status post surgery 7/16.  7/17 patient developed lethargy and hospital service was consulted for further evaluation.  Extensive workup unrevealing.  Seen by neurology and psychiatry.  Gradually improving.  Suspect multifactorial including postanesthesia, stress, nonorganic.  Primary service: Orthopedics Admit 7/16 for surgery  Procedures/Events 7/16 Right hip stage I revision explantation and debridement of right hip with implantation of antibiotic articulating hip spacer

## 2024-04-06 NOTE — Plan of Care (Signed)
  Problem: Education: Goal: Knowledge of General Education information will improve Description: Including pain rating scale, medication(s)/side effects and non-pharmacologic comfort measures Outcome: Progressing   Problem: Health Behavior/Discharge Planning: Goal: Ability to manage health-related needs will improve Outcome: Progressing   Problem: Clinical Measurements: Goal: Ability to maintain clinical measurements within normal limits will improve Outcome: Progressing Goal: Will remain free from infection Outcome: Progressing Goal: Diagnostic test results will improve Outcome: Progressing Goal: Respiratory complications will improve Outcome: Progressing Goal: Cardiovascular complication will be avoided Outcome: Progressing   Problem: Activity: Goal: Risk for activity intolerance will decrease Outcome: Progressing   Problem: Nutrition: Goal: Adequate nutrition will be maintained Outcome: Progressing   Problem: Coping: Goal: Level of anxiety will decrease Outcome: Progressing   Problem: Elimination: Goal: Will not experience complications related to bowel motility Outcome: Progressing Goal: Will not experience complications related to urinary retention Outcome: Progressing   Problem: Pain Managment: Goal: General experience of comfort will improve and/or be controlled Outcome: Progressing   Problem: Safety: Goal: Ability to remain free from injury will improve Outcome: Progressing   Problem: Skin Integrity: Goal: Risk for impaired skin integrity will decrease Outcome: Progressing   Problem: Education: Goal: Knowledge of the prescribed therapeutic regimen will improve Outcome: Progressing Goal: Understanding of discharge needs will improve Outcome: Progressing   Problem: Activity: Goal: Ability to avoid complications of mobility impairment will improve Outcome: Progressing Goal: Ability to tolerate increased activity will improve Outcome: Progressing    Problem: Clinical Measurements: Goal: Postoperative complications will be avoided or minimized Outcome: Progressing   Problem: Pain Management: Goal: Pain level will decrease with appropriate interventions Outcome: Progressing   Problem: Skin Integrity: Goal: Will show signs of wound healing Outcome: Progressing

## 2024-04-06 NOTE — Progress Notes (Signed)
 PT Cancellation Note  Patient Details Name: VALREE FEILD MRN: [id number] DOB: 10-25-1975   Cancelled Treatment:     PT deferred this am.  RN advises pt continues minimally responsive and requiring total assist for all care.  Will follow.   Norman Bier 04/06/2024, 11:45 AM

## 2024-04-06 NOTE — TOC Progression Note (Addendum)
 Transition of Care [facility]) - Progression Note    Patient Details  Name: Ashley [name] MRN: [id number] Date of Birth: [birth year]  Transition of Care [facility]) CM/SW Contact  Sonda Manuella Quill, RN Phone Number: 04/06/2024, 11:53 AM  Clinical Narrative:    PICC line in place for home IV abx; Holley Herring at [facility] also aware; she will contact pt's sister [name] to arrange teaching; pt has HHPT arranged w/ [facility]; spoke w/ Burnard at agency; she will see when RN can see pt; awaiting return call; will update Pam; OPAT orders in; awaiting orders for HHPT/RN; Dr Montine notified via secure chat  -1249- notified by Burnard at [facility] that pt has been put on schedule for [facility] on Monday  -1519- notified by Dr Montine pt's d/c date dependent upon her she progression w/ PT, and she will likely be in hospital until Monday; Pam at [place] and [place] [facility] notified; [facility] is following.     Barriers to Discharge: Continued Medical Work up  Expected Discharge Plan and Services                         DME Arranged: Vannie rolling DME Agency: [employer]       HH Arranged: PT HH Agency: [facility]         Social Determinants of Health (SDOH) Interventions SDOH Screenings   Food Insecurity: Patient Declined (04/04/2024)  Housing: Unknown (04/04/2024)  Transportation Needs: Patient Declined (04/04/2024)  Utilities: Patient Declined (04/04/2024)  Depression (PHQ2-9): Low Risk  (07/08/2020)  Tobacco Use: Low Risk  (04/04/2024)    Readmission Risk Interventions    04/05/2024    1:25 PM  Readmission Risk Prevention Plan  Post Dischage Appt Complete  Medication Screening Complete  Transportation Screening Complete

## 2024-04-06 NOTE — Progress Notes (Signed)
 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 On exam patient appears to be following commands and appears she is having issue with sorethroat/conversation. Discuss with family current abx unlikely to be reason and reassurance given   ----------------- 04/06/24 id assessment Cx growing gpc but not yet identified Mentating still not baseline - hospital medicine consulted by ortho Febrile 100.5 last 24 hours Supplemental o2 stable 2 liters. Cxr perihilar opacity. ?aspiration in setting ams  Again no suspicion abx toxicity in regard to ams   Plan: Continue daptomycin ; will need to ask lab to send sensitivity for dapto is mrsa Change ceftriaxone  to amp-sulb and will continue for 5 days for possible aspiration pna F/u final cx Maintain standard isolation precaution Weekly cpk/sed rate monitoring and twice weekly cbc/cmp/crp Discussed with primary team/hospital medicine     Principal Problem:   Failed total hip arthroplasty (HCC)   Allergies  Allergen Reactions   Gadolinium Derivatives Nausea And Vomiting and Other (See Comments)    Pt describes this happens every time she gets gado even with slow injection     Codeine Itching, Nausea Only and Other (See Comments)    Tolerates oxycodone     Duloxetine     altered mental status    Scheduled Meds:  buPROPion   150 mg Oral Daily   carvedilol   6.25 mg Oral BID WC   Chlorhexidine  Gluconate Cloth  6 each Topical Daily   docusate sodium   100 mg Oral BID   [START ON 04/07/2024] enoxaparin   40 mg Subcutaneous Q24H   hydrochlorothiazide   12.5 mg Oral Daily   And   losartan   100 mg Oral Daily   lisdexamfetamine  50 mg Oral Daily   pantoprazole   40 mg Oral Daily   pregabalin   75 mg Oral TID   rosuvastatin   10 mg Oral Daily   senna  1 tablet Oral BID   sodium chloride  flush  10-40 mL Intracatheter Q12H   Continuous Infusions:  DAPTOmycin  700 mg (04/05/24 1451)   lactated ringers  75 mL/hr at 04/06/24 0622   PRN Meds:.acetaminophen , acetaminophen , albuterol , diphenhydrAMINE , furosemide , HYDROmorphone  (DILAUDID ) injection, lidocaine , loratadine , menthol -cetylpyridinium **OR** phenol, methocarbamol  **OR** methocarbamol  (ROBAXIN ) injection, naloxone , ondansetron  **OR** ondansetron  (ZOFRAN ) IV, oxyCODONE , polyethylene glycol, sodium chloride  flush, zolpidem    SUBJECTIVE: Patient still not verbal but follows command Fever tmax 100.5  Stable 2 liters o2 supplement Hip culture gpc not yet identified  No other issue Hospital medicine following   Review of Systems: ROS All other ROS was negative,  except mentioned above     OBJECTIVE: Vitals:   04/05/24 1047 04/05/24 1954 04/05/24 2159 04/06/24 0506  BP: 126/78  139/85 (!) 158/80  Pulse: (!) 107     Resp: 15  16 16   Temp:  (!) 100.5 F (38.1 C) 99 F (37.2 C) 100.3 F (37.9 C)  TempSrc:  Oral    SpO2: 100%  98% 96%  Weight:      Height:       Body mass index is 35.98 kg/m.  Physical Exam General/constitutional: no distress; nonverbal; tracking  HEENT: Normocephalic, PER, Conj Clear, EOMI, Oropharynx clear Neck supple CV: rrr no mrg Lungs: clear to auscultation, normal respiratory  effort Abd: Soft, Nontender Ext: no edema Skin: No Rash Neuro: no obvious motor deficit with bilateral upper ext movement; follows simple command MSK: right hip dressing in place     Lab Results Lab Results  Component Value Date   WBC 10.6 (H) 04/06/2024   HGB 9.9 (L) 04/06/2024   HCT 31.1 (L) 04/06/2024   MCV 82.5 04/06/2024   PLT 112 (L) 04/06/2024    Lab Results  Component Value Date   CREATININE 0.61 04/06/2024   BUN 12 04/06/2024   NA 139 04/06/2024   K 3.5 04/06/2024   CL 106 04/06/2024   CO2 23 04/06/2024    Lab Results  Component Value Date   ALT 21 04/05/2024   AST 38 04/05/2024   ALKPHOS 52 04/05/2024   BILITOT 0.7 04/05/2024      Microbiology: Recent Results (from the past 240 hours)  Aerobic/Anaerobic Culture w Gram Stain (surgical/deep wound)     Status: None (Preliminary result)   Collection Time: 04/04/24 12:38 PM   Specimen: Synovial, Right Hip; Body Fluid  Result Value Ref Range Status   Specimen Description   Final    TISSUE Performed at [facility], [address], [place], [place] [address]    Special Requests R HIP SAMPLE 1A  Final   Gram Stain NO WBC SEEN NO ORGANISMS SEEN   Final   Culture   Final    NO GROWTH 2 DAYS NO ANAEROBES ISOLATED; CULTURE IN PROGRESS FOR 5 DAYS Performed at [facility], [address], [place], [place] [address]    Report Status PENDING  Incomplete  Aerobic/Anaerobic Culture w Gram Stain (surgical/deep wound)     Status: None (Preliminary result)   Collection Time: 04/04/24 12:46 PM   Specimen: Synovial, Right Hip; Body Fluid  Result Value Ref Range Status   Specimen Description   Final    TISSUE Performed at [facility], [address], [place], [place] [address]    Special Requests R HIP SAMPLE B HOLD 2WKS  Final   Gram Stain NO WBC SEEN NO ORGANISMS SEEN   Final   Culture   Final    NO GROWTH 2 DAYS NO ANAEROBES ISOLATED; CULTURE IN PROGRESS FOR 5  DAYS Performed at [facility], [address] [place], [place] [address]    Report Status PENDING  Incomplete  Aerobic/Anaerobic Culture w Gram Stain (surgical/deep wound)     Status: None (Preliminary result)   Collection Time: 04/04/24 12:49 PM   Specimen: Synovial, Right Hip; Body Fluid  Result Value Ref Range Status   Specimen Description   Final    TISSUE Performed at [facility], [address] [place], [place] [address]    Special Requests   Final    R  HIP Performed at [facility], [address] [place], [place] [address]    Gram Stain NO WBC SEEN RARE GRAM POSITIVE COCCI   Final   Culture   Final    NO GROWTH 2 DAYS NO ANAEROBES ISOLATED; CULTURE IN PROGRESS FOR 5 DAYS Performed at [facility], [address] [place], [place] [address]    Report Status PENDING  Incomplete  Aerobic/Anaerobic Culture w Gram Stain (surgical/deep wound)     Status: None (Preliminary result)   Collection Time: 04/04/24  1:31 PM   Specimen: Path Tissue  Result Value Ref Range Status   Specimen Description   Final    TISSUE Performed at [facility], [address] [place], [place] [address]    Special Requests R HIP SAMPLE D HOLD 14 DAYSQ  Final   Gram Stain   Final    FEW WBC PRESENT,BOTH PMN AND MONONUCLEAR NO ORGANISMS SEEN    Culture   Final    NO GROWTH 2 DAYS NO ANAEROBES ISOLATED; CULTURE IN PROGRESS FOR 5 DAYS Performed at [facility], [address], [place], [place] [address]    Report Status PENDING  Incomplete     Serology:   Imaging: If present, new imagings (plain films, ct scans, and mri) have been personally visualized and interpreted; radiology reports have been reviewed. Decision making incorporated into the Impression / Recommendations.  7/17 ct head  Normal    7/16 xray right hip FINDINGS:   BONES AND JOINTS: Postoperative change of the right hip. No acute fracture or  dislocation. The procedure performed on the right hip was an explantation and debridement of the right hip with implantation of antibiotic articulating hip spacer.   SOFT TISSUES: Tubal ligation clips in the pelvis.   IMPRESSION: 1. Postoperative changes in the right hip.   Constance ONEIDA Passer, MD [facility] [phone] pager    04/06/2024, 1:32 PM

## 2024-04-06 NOTE — Progress Notes (Signed)
 Peripherally Inserted Central Catheter Placement  The IV Nurse has discussed with the patient and/or persons authorized to consent for the patient, the purpose of this procedure and the potential benefits and risks involved with this procedure.  The benefits include less needle sticks, lab draws from the catheter, and the patient may be discharged home with the catheter. Risks include, but not limited to, infection, bleeding, blood clot (thrombus formation), and puncture of an artery; nerve damage and irregular heartbeat and possibility to perform a PICC exchange if needed/ordered by physician.  Alternatives to this procedure were also discussed.  Bard Power PICC patient education guide, fact sheet on infection prevention and patient information card has been provided to patient /or left at bedside.    PICC Placement Documentation  PICC Single Lumen 04/06/24 Right Brachial 39 cm 0 cm (Active)  Indication for Insertion or Continuance of Line Prolonged intravenous therapies 04/06/24 0939  Exposed Catheter (cm) 0 cm 04/06/24 9060  Site Assessment Clean, Dry, Intact 04/06/24 9060  Line Status Flushed;Blood return noted;Saline locked 04/06/24 0939  Dressing Type Transparent 04/06/24 0939  Dressing Status Antimicrobial disc/dressing in place 04/06/24 0939  Line Care Connections checked and tightened 04/06/24 0939  Line Adjustment (NICU/IV Team Only) Yes 04/06/24 0939  Dressing Intervention New dressing 04/06/24 0939  Dressing Change Due 04/13/24 04/06/24 0939       Aron Maryalice Dolores 04/06/2024, 9:41 AM

## 2024-04-07 DIAGNOSIS — T84018D Broken internal joint prosthesis, other site, subsequent encounter: Secondary | ICD-10-CM | POA: Diagnosis not present

## 2024-04-07 DIAGNOSIS — G9340 Encephalopathy, unspecified: Secondary | ICD-10-CM | POA: Diagnosis not present

## 2024-04-07 DIAGNOSIS — D649 Anemia, unspecified: Secondary | ICD-10-CM

## 2024-04-07 DIAGNOSIS — D696 Thrombocytopenia, unspecified: Secondary | ICD-10-CM | POA: Diagnosis not present

## 2024-04-07 LAB — COMPREHENSIVE METABOLIC PANEL WITH GFR
ALT: 17 U/L (ref 0–44)
AST: 23 U/L (ref 15–41)
Albumin: 2.5 g/dL — ABNORMAL LOW (ref 3.5–5.0)
Alkaline Phosphatase: 46 U/L (ref 38–126)
Anion gap: 10 (ref 5–15)
BUN: 10 mg/dL (ref 6–20)
CO2: 22 mmol/L (ref 22–32)
Calcium: 7.8 mg/dL — ABNORMAL LOW (ref 8.9–10.3)
Chloride: 105 mmol/L (ref 98–111)
Creatinine, Ser: 0.68 mg/dL (ref 0.44–1.00)
GFR, Estimated: >60 mL/min (ref >60)
Glucose, Bld: 104 mg/dL — ABNORMAL HIGH (ref 70–99)
Potassium: 3.4 mmol/L — ABNORMAL LOW (ref 3.5–5.1)
Sodium: 137 mmol/L (ref 135–145)
Total Bilirubin: 0.9 mg/dL (ref 0.0–1.2)
Total Protein: 5.7 g/dL — ABNORMAL LOW (ref 6.5–8.1)

## 2024-04-07 LAB — PHOSPHORUS: Phosphorus: 2.9 mg/dL (ref 2.5–4.6)

## 2024-04-07 LAB — CBC
HCT: 29.3 % — ABNORMAL LOW (ref 36.0–46.0)
Hemoglobin: 9.2 g/dL — ABNORMAL LOW (ref 12.0–15.0)
MCH: 25.9 pg — ABNORMAL LOW (ref 26.0–34.0)
MCHC: 31.4 g/dL (ref 30.0–36.0)
MCV: 82.5 fL (ref 80.0–100.0)
Platelets: 103 K/uL — ABNORMAL LOW (ref 150–400)
RBC: 3.55 MIL/uL — ABNORMAL LOW (ref 3.87–5.11)
RDW: 16.2 % — ABNORMAL HIGH (ref 11.5–15.5)
WBC: 10.2 K/uL (ref 4.0–10.5)
nRBC: 0 % (ref 0.0–0.2)

## 2024-04-07 LAB — MAGNESIUM: Magnesium: 1.8 mg/dL (ref 1.7–2.4)

## 2024-04-07 LAB — CK: Total CK: 484 U/L — ABNORMAL HIGH (ref 38–234)

## 2024-04-07 NOTE — Progress Notes (Signed)
 19: Called to patient's room by her father.  The patient is screaming and crying although she still interacts minimally.  She was asked 3 times if she was hurting/in pain before she yelled out 'Yes!  Explained to the patient that I would be unable to help her if she wasn't able to communicate with me. She continued screaming and crying.  Dilaudid  1mg  IV given.

## 2024-04-07 NOTE — Evaluation (Signed)
 Physical Therapy Evaluation Patient Details Name: Ashley [name] MRN: [id number] DOB: 01-20-1976 Today's Date: 04/07/2024  History of Present Illness  Ashley [name] is a 48 y.o. F admitted 04/04/2024 for a planned explant of right hip hardware with antibiotic spacer placement for chronic prosthetic joint infection. PMH:  HTN, depression/anxiety, s/p sleeve gastrectomy, s/p right THA 2017, chronic pain syndrome  Clinical Impression  Pt is typically independent in ADL and mobility. She was moving slower prior to planned sx for hip but still driving and taking care of her 9y/o child. Today she is severely lethargic/obtunded. Requires cues to keep eyes open, minimally verbally engaged - despite encouragement from therapy team, MD, and family/friend in room. She was max A +3 to come EOB for dangle, and required mod to max A with moments of min G for seated balance. Even with upright position she struggled to maintain arousal and focus on tasks at hand. Total A +3 to return supine. Posterior hip precautions maintained throughout session, and PT will continue to follow acutely. Due to pts age, PLOF and good family support  - PT recommending post-acute rehab of >3 hours daily to maximize safety and independence with mobility.          If plan is discharge home, recommend the following: Two people to help with walking and/or transfers;Two people to help with bathing/dressing/bathroom;Assistance with cooking/housework;Assist for transportation;Help with stairs or ramp for entrance   Can travel by private vehicle        Equipment Recommendations Rolling walker (2 wheels)  Recommendations for Other Services       Functional Status Assessment Patient has had a recent decline in their functional status and demonstrates the ability to make significant improvements in function in a reasonable and predictable amount of time.     Precautions / Restrictions Precautions Precautions: Posterior  Hip Precaution Booklet Issued: Yes (comment) Recall of Precautions/Restrictions: Impaired Restrictions Weight Bearing Restrictions Per Provider Order: Yes RLE Weight Bearing Per Provider Order: Partial weight bearing RLE Partial Weight Bearing Percentage or Pounds: 20% Other Position/Activity Restrictions: posterior hip      Mobility  Bed Mobility Overal bed mobility: Needs Assistance Bed Mobility: Supine to Sit, Sit to Supine     Supine to sit: HOB elevated, Used rails, Max assist Sit to supine: Total assist   General bed mobility comments: +3 max to total assist for all aspects of bed mobility, use of bed pad to maintain hip precautions and bring hips EOB. Pt did attempt to help with rail. Pt crying and moaning during transition    Transfers                   General transfer comment: NT this session due to arousal    Ambulation/Gait                  Stairs            Wheelchair Mobility     Tilt Bed    Modified Rankin (Stroke Patients Only)       Balance Overall balance assessment: Needs assistance Sitting-balance support: Feet supported, Bilateral upper extremity supported Sitting balance-Leahy Scale: Poor Sitting balance - Comments: able to have moments of GCA - but waxed and wained between max and min guard - fatiguing quickly Postural control: Posterior lean  Pertinent Vitals/Pain Pain Assessment Pain Assessment: Faces Faces Pain Scale: Hurts whole lot Pain Location: everywhere R hip Pain Descriptors / Indicators: Crying, Grimacing, Guarding, Moaning Pain Intervention(s): Limited activity within patient's tolerance, Monitored during session, Premedicated before session, Ice applied    Home Living Family/patient expects to be discharged to:: Private residence Living Arrangements: Children;Other relatives Available Help at Discharge: Family;Available 24 hours/day Type of Home:  House Home Access: Other (comment)       Home Layout: One level Home Equipment: Cane - single point;Tub bench;BSC/3in1;Adaptive equipment Additional Comments: sister confirmed set up    Prior Function Prior Level of Function : Independent/Modified Independent                     Extremity/Trunk Assessment   Upper Extremity Assessment Upper Extremity Assessment: Overall WFL for tasks assessed    Lower Extremity Assessment Lower Extremity Assessment: Difficult to assess due to impaired cognition RLE Deficits / Details: posterior precautions and 20% PWB due - spacer in place    Cervical / Trunk Assessment Cervical / Trunk Assessment: Other exceptions Cervical / Trunk Exceptions: hx of chronic back pain  Communication   Communication Communication: Impaired Factors Affecting Communication: Other (comment)    Cognition Arousal: Lethargic, Obtunded Behavior During Therapy: Flat affect                             Following commands: Impaired Following commands impaired: Follows one step commands inconsistently, Follows one step commands with increased time     Cueing Cueing Techniques: Verbal cues, Gestural cues, Tactile cues     General Comments General comments (skin integrity, edema, etc.): Pt did not have supplemental O2 in nose when therapy team entered. No O2 used throughout session and SpO2 maintained at 93% and higher. Left off at end of session, RN aware. Sister and friend present throughout and very supportive    Exercises     Assessment/Plan    PT Assessment Patient needs continued PT services  PT Problem List Decreased strength;Decreased range of motion;Decreased activity tolerance;Decreased balance;Decreased mobility;Decreased knowledge of use of DME;Pain       PT Treatment Interventions DME instruction;Gait training;Stair training;Functional mobility training;Therapeutic activities;Therapeutic exercise;Balance training;Patient/family  education    PT Goals (Current goals can be found in the Care Plan section)  Acute Rehab PT Goals Patient Stated Goal: No goals expressed by pt. PT Goal Formulation: With patient/family Time For Goal Achievement: 04/20/24 Potential to Achieve Goals: Fair    Frequency 7X/week     Co-evaluation PT/OT/SLP Co-Evaluation/Treatment: Yes Reason for Co-Treatment: Necessary to address cognition/behavior during functional activity;For patient/therapist safety;To address functional/ADL transfers PT goals addressed during session: Mobility/safety with mobility;Balance;Strengthening/ROM OT goals addressed during session: ADL's and self-care;Strengthening/ROM       AM-PAC PT 6 Clicks Mobility  Outcome Measure Help needed turning from your back to your side while in a flat bed without using bedrails?: Total Help needed moving from lying on your back to sitting on the side of a flat bed without using bedrails?: Total Help needed moving to and from a bed to a chair (including a wheelchair)?: Total Help needed standing up from a chair using your arms (e.g., wheelchair or bedside chair)?: Total Help needed to walk in hospital room?: Total Help needed climbing 3-5 steps with a railing? : Total 6 Click Score: 6    End of Session   Activity Tolerance: Patient limited by fatigue Patient left: in bed;with  call bell/phone within reach;with family/visitor present;with bed alarm set Nurse Communication: Mobility status;Need for lift equipment PT Visit Diagnosis: Difficulty in walking, not elsewhere classified (R26.2);Pain;Muscle weakness (generalized) (M62.81) Pain - Right/Left: Right Pain - part of body: Hip    Time: 9065-8994 PT Time Calculation (min) (ACUTE ONLY): 31 min   Charges:   PT Evaluation $PT Eval Moderate Complexity: 1 Mod   PT General Charges $$ ACUTE PT VISIT: 1 Visit         [facility] PT Acute Rehabilitation Services Office [phone]   Chelbi Herber 04/07/2024,  1:22 PM

## 2024-04-07 NOTE — Progress Notes (Signed)
 Inpatient Rehab Admissions Coordinator:   Per therapy recommendations, patient was screened for CIR candidacy by Leita Kleine, MS, CCC-SLP.  At this time, Pt. is not at a level to tolerate the intensity of CIR and medical necessity for CIR admit is unclear. If ready for DC now, recommend TOC seek other venues. If she remains admitted CIR will rescreen once tolerance with therapy improves.Please contact me with any questions.   Leita Kleine, MS, CCC-SLP Rehab Admissions Coordinator  [phone] (celll) [phone] (office)

## 2024-04-07 NOTE — Plan of Care (Signed)
  Problem: Education: Goal: Knowledge of General Education information will improve Description: Including pain rating scale, medication(s)/side effects and non-pharmacologic comfort measures Outcome: Progressing   Problem: Health Behavior/Discharge Planning: Goal: Ability to manage health-related needs will improve Outcome: Progressing   Problem: Clinical Measurements: Goal: Ability to maintain clinical measurements within normal limits will improve Outcome: Progressing Goal: Will remain free from infection Outcome: Progressing Goal: Diagnostic test results will improve Outcome: Progressing Goal: Respiratory complications will improve Outcome: Progressing Goal: Cardiovascular complication will be avoided Outcome: Progressing   Problem: Activity: Goal: Risk for activity intolerance will decrease Outcome: Progressing   Problem: Nutrition: Goal: Adequate nutrition will be maintained Outcome: Progressing   Problem: Coping: Goal: Level of anxiety will decrease Outcome: Progressing   Problem: Elimination: Goal: Will not experience complications related to bowel motility Outcome: Progressing Goal: Will not experience complications related to urinary retention Outcome: Progressing   Problem: Pain Managment: Goal: General experience of comfort will improve and/or be controlled Outcome: Progressing   Problem: Safety: Goal: Ability to remain free from injury will improve Outcome: Progressing   Problem: Skin Integrity: Goal: Risk for impaired skin integrity will decrease Outcome: Progressing   Problem: Education: Goal: Knowledge of the prescribed therapeutic regimen will improve Outcome: Progressing Goal: Understanding of discharge needs will improve Outcome: Progressing   Problem: Activity: Goal: Ability to avoid complications of mobility impairment will improve Outcome: Progressing Goal: Ability to tolerate increased activity will improve Outcome: Progressing    Problem: Clinical Measurements: Goal: Postoperative complications will be avoided or minimized Outcome: Progressing   Problem: Pain Management: Goal: Pain level will decrease with appropriate interventions Outcome: Progressing   Problem: Skin Integrity: Goal: Will show signs of wound healing Outcome: Progressing

## 2024-04-07 NOTE — Evaluation (Signed)
 Occupational Therapy Evaluation Patient Details Name: Ashley [name] MRN: [id number] DOB: 01-Nov-1975 Today's Date: 04/07/2024   History of Present Illness   Ashley [name] is a 48 y.o. F admitted 04/04/2024 for a planned explant of right hip hardware with antibiotic spacer placement for chronic prosthetic joint infection. PMH:  HTN, depression/anxiety, s/p sleeve gastrectomy, s/p right THA 2017, chronic pain syndrome     Clinical Impressions Pt is typically independent in ADL and mobility. She was moving slower prior to planned sx for hip but still driving and taking care of her 9y/o child. Today she is severely lethargic/obtunded. Requires cues to keep eyes open, minimally verbally engaged - despite encouragement from therapy team, MD, and family/friend in room. She was max A for LB ADL and max A for UB ADL. Hand over hand to total A for washing face, and drank a sip of mouthwash instead of swish and spit. She was max A +3 to come EOB for dangle, and required mod to max A with moments of min G for seated balance. Even with upright position she struggled to maintain arousal and focus on tasks at hand. Total A +3 to return supine. Posterior hip precautions maintained throughout session, and OT will continue to follow acutely. Due to good family support and PLOF - OT recommending post-acute rehab of >3 hours daily to maximize safety and independence in ADL and functional transfers. Next session to continue to attempt OOB and functional ADL with compensatory strategies to maintain posterior hip precautions.      If plan is discharge home, recommend the following:   Two people to help with walking and/or transfers;A lot of help with bathing/dressing/bathroom;Assistance with cooking/housework;Direct supervision/assist for medications management;Assist for transportation;Help with stairs or ramp for entrance     Functional Status Assessment   Patient has had a recent decline in their functional  status and demonstrates the ability to make significant improvements in function in a reasonable and predictable amount of time.     Equipment Recommendations   Other (comment) (defer to next venue of care)     Recommendations for Other Services   Rehab consult;PT consult;Speech consult     Precautions/Restrictions   Precautions Precautions: Posterior Hip Precaution Booklet Issued: Yes (comment) Recall of Precautions/Restrictions: Impaired Restrictions Weight Bearing Restrictions Per Provider Order: Yes RLE Weight Bearing Per Provider Order: Partial weight bearing RLE Partial Weight Bearing Percentage or Pounds: 20% Other Position/Activity Restrictions: poterior hip     Mobility Bed Mobility Overal bed mobility: Needs Assistance Bed Mobility: Supine to Sit, Sit to Supine     Supine to sit: HOB elevated, Used rails, Max assist (+3 assist) Sit to supine: Total assist (+3 assist)   General bed mobility comments: +3 max to total assist for all aspects of bed mobility, use of bed pad to maintain hip precautions and bring hips EOB. Pt did attempt to help with rail. Pt crying and moaning during transition    Transfers                   General transfer comment: NT this session due to arousal      Balance Overall balance assessment: Needs assistance Sitting-balance support: Feet supported, Bilateral upper extremity supported Sitting balance-Leahy Scale: Poor Sitting balance - Comments: able to have moments of GCA - but waxed and wained between max and min guard - fatiguing quickly Postural control: Posterior lean  ADL either performed or assessed with clinical judgement   ADL Overall ADL's : Needs assistance/impaired Eating/Feeding: Maximal assistance;Bed level   Grooming: Wash/dry face;Oral care;Maximal assistance;Sitting Grooming Details (indicate cue type and reason): hand over hand, can bring hand to face but  does not attend to task at hand Upper Body Bathing: Total assistance   Lower Body Bathing: Total assistance   Upper Body Dressing : Maximal assistance   Lower Body Dressing: Total assistance   Toilet Transfer:  (NT This session) Toilet Transfer Details (indicate cue type and reason): NT this session. purewick in place Toileting- Clothing Manipulation and Hygiene: Maximal assistance;Bed level       Functional mobility during ADLs:  (NT this session)       Vision   Vision Assessment?:  (continue to assess, did not focus well; alertness impact visual attention)     Perception         Praxis         Pertinent Vitals/Pain Pain Assessment Pain Assessment: Faces Faces Pain Scale: Hurts whole lot Pain Location: everywhere R hip Pain Descriptors / Indicators: Crying, Grimacing, Guarding, Moaning Pain Intervention(s): Limited activity within patient's tolerance, Monitored during session, Premedicated before session, Repositioned     Extremity/Trunk Assessment Upper Extremity Assessment Upper Extremity Assessment: Overall WFL for tasks assessed   Lower Extremity Assessment Lower Extremity Assessment: Defer to PT evaluation;RLE deficits/detail RLE Deficits / Details: posterior precautions and 20% PWB due - spacer in place   Cervical / Trunk Assessment Cervical / Trunk Assessment: Other exceptions Cervical / Trunk Exceptions: hx of chronic back pain   Communication Communication Communication: Impaired Factors Affecting Communication: Other (comment) (arousal impacting communication)   Cognition Arousal: Lethargic, Obtunded (waxed and wained throughout session) Behavior During Therapy: Flat affect Cognition: Cognition impaired, Difficult to assess Difficult to assess due to: Level of arousal Orientation impairments:  (did not answer) Awareness: Intellectual awareness impaired, Online awareness impaired   Attention impairment (select first level of impairment):  Focused attention Executive functioning impairment (select all impairments): Initiation, Organization, Sequencing, Reasoning, Problem solving OT - Cognition Comments: Pt with decreased arousal, very little verbalization during session. Eyes not frequently open, and required cueing. sipped mouthwash instead of swish and spit, could not attend long enough to wash face. OT will continue to assess                 Following commands: Impaired Following commands impaired: Follows one step commands inconsistently, Follows one step commands with increased time     Cueing  General Comments   Cueing Techniques: Verbal cues;Gestural cues;Tactile cues  Pt did not have supplemental O2 in nose when therapy team entered. No O2 used throughout session and SpO2 maintained at 93% and higher. Left off at end of session, RN aware. Sister and friend present throughout and very supportive   Exercises     Shoulder Instructions      Home Living Family/patient expects to be discharged to:: Private residence Living Arrangements: Children;Other relatives (81 y/o) Available Help at Discharge: Family;Available 24 hours/day Type of Home: House Home Access: Other (comment) (threshold)     Home Layout: One level     Bathroom Shower/Tub: Producer, television/film/video: Handicapped height Bathroom Accessibility: Yes How Accessible: Accessible via walker Home Equipment: Cane - single point;Tub bench;BSC/3in1;Adaptive equipment Adaptive Equipment: Reacher;Sock aid;Long-handled sponge Additional Comments: sister confirmed set up      Prior Functioning/Environment Prior Level of Function : Independent/Modified Independent  OT Problem List: Decreased strength;Decreased range of motion;Decreased activity tolerance;Impaired balance (sitting and/or standing);Decreased cognition;Decreased safety awareness;Decreased knowledge of use of DME or AE;Obesity;Pain   OT  Treatment/Interventions: Self-care/ADL training;DME and/or AE instruction;Therapeutic activities;Cognitive remediation/compensation;Patient/family education;Balance training      OT Goals(Current goals can be found in the care plan section)   Acute Rehab OT Goals Patient Stated Goal: get her as independent as possible OT Goal Formulation: With family Time For Goal Achievement: 04/21/24 Potential to Achieve Goals: Good ADL Goals Pt Will Perform Grooming: with modified independence;sitting Pt Will Perform Upper Body Dressing: with supervision;sitting Pt Will Perform Lower Body Dressing: with min assist;with adaptive equipment;sitting/lateral leans Pt Will Transfer to Toilet: with min assist;stand pivot transfer;bedside commode Pt Will Perform Toileting - Clothing Manipulation and hygiene: with supervision;sitting/lateral leans Additional ADL Goal #1: Pt will perform bed mobility while maintaining posterior hip precautions with min A prior to engaging in ADL EOB   OT Frequency:  Min 2X/week    Co-evaluation PT/OT/SLP Co-Evaluation/Treatment: Yes Reason for Co-Treatment: Necessary to address cognition/behavior during functional activity;For patient/therapist safety;To address functional/ADL transfers PT goals addressed during session: Mobility/safety with mobility;Balance;Strengthening/ROM OT goals addressed during session: ADL's and self-care;Strengthening/ROM      AM-PAC OT 6 Clicks Daily Activity     Outcome Measure Help from another person eating meals?: A Lot Help from another person taking care of personal grooming?: A Lot Help from another person toileting, which includes using toliet, bedpan, or urinal?: Total Help from another person bathing (including washing, rinsing, drying)?: A Lot Help from another person to put on and taking off regular upper body clothing?: A Lot Help from another person to put on and taking off regular lower body clothing?: Total 6 Click Score:  10   End of Session Nurse Communication: Mobility status;Need for lift equipment;Weight bearing status;Precautions  Activity Tolerance: Patient limited by lethargy Patient left: in bed;with call bell/phone within reach;with family/visitor present  OT Visit Diagnosis: Unsteadiness on feet (R26.81);Other abnormalities of gait and mobility (R26.89);Muscle weakness (generalized) (M62.81);Other symptoms and signs involving cognitive function;Pain Pain - Right/Left: Right Pain - part of body: Hip                Time: 9070-8996 OT Time Calculation (min): 34 min Charges:  OT General Charges $OT Visit: 1 Visit OT Evaluation $OT Eval Moderate Complexity: 1 Mod  Leita DEL OTR/L Acute Rehabilitation Services Office: [phone]  Leita PARAS [facility] 04/07/2024, 10:57 AM

## 2024-04-07 NOTE — Progress Notes (Signed)
 Progress Note   Patient: Ashley [name] FMW:[id number] DOB: Dec 04, 1975 DOA: 04/04/2024     3 DOS: the patient was seen and examined on 04/07/2024   Brief hospital course: 48 year old woman PMH sleeve gastrectomy, multiple joint surgeries, chronic pain syndrome, admitted for semielective right THA explant for chronic prosthetic joint infection.  Status post surgery 7/16.  7/17 patient developed lethargy and hospital service was consulted for further evaluation.  Primary service: Orthopedics Admit 7/16 for surgery  Procedures/Events 7/16 Right hip stage I revision explantation and debridement of right hip with implantation of antibiotic articulating hip spacer   Assessment and Plan: Lethargy Acute encephalopathy Workup unrevealing including CT head negative, serum ammonia within normal limits, BMP unremarkable. CXR left perihilar infiltrate but no clinical signs or symptoms of infection. More awake today, taking pills now, follows simple commands, nonfocal exam, more interactive.  Nonorganic etiology seems most likely at this point.  I do not think any further imaging would be fruitful.   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 Continue antibiotics per ID   Thrombocytopenia Normocytic anemia Thrombocytopenia likely perioperative in nature, slightly lower today, monitor Hemoglobin slightly lower but probably normal variation  Hypertension: BP is stable.  Continue Coreg , HCTZ, losartan  when taking p.o.   Chronic pain syndrome: Home regimen includes Percocet 10-325 q6h prn, fill consistently per PDMP.  Has not been taking oral pain meds due to mentation and will be at risk for withdrawal.  Can utilize IV Dilaudid  as already ordered for pain control/sign of withdrawal.   Depression/anxiety: Continue Wellbutrin .      Subjective:  Per RN overnight: 0051: Called to patient's room by her father. The patient  is screaming and crying although she still interacts minimally. She was asked 3 times if she was hurting/in pain before she yelled out 'Yes! Explained to the patient that I would be unable to help her if she wasn't able to communicate with me. She continued screaming and crying. Dilaudid  1mg  IV given.   Seems to be a bit better Doesn't offer much history Nods yes and no  Physical Exam: Vitals:   04/06/24 0506 04/06/24 1344 04/06/24 2134 04/07/24 0534  BP: (!) 158/80 (!) 143/97 (!) 173/92 (!) 143/95  Pulse:  99 (!) 103 98  Resp: 16 20 15 12   Temp: 100.3 F (37.9 C) 98.9 F (37.2 C) 98.9 F (37.2 C) 98.3 F (36.8 C)  TempSrc:  Oral Oral Oral  SpO2: 96% 99% 93% 93%  Weight:      Height:       Physical Exam Vitals reviewed.  Constitutional:      General: She is not in acute distress.    Appearance: She is not ill-appearing or toxic-appearing.  Cardiovascular:     Rate and Rhythm: Normal rate and regular rhythm.     Heart sounds: No murmur heard. Pulmonary:     Effort: Pulmonary effort is normal. No respiratory distress.     Breath sounds: No wheezing, rhonchi or rales.  Neurological:     Mental Status: She is alert.  Psychiatric:        Mood and Affect: Mood normal.        Behavior: Behavior normal.   Follows simple commands, moves all extremities, refer spines with few words and answer.  States name clearly and responds yes or no.  Data Reviewed: Afebrile CMP noted WBC WNL Hgb stable 9.2 Plts slightly lower  Family Communication: friend at bedside  Disposition: Status is: Inpatient Remains inpatient appropriate because: s/p surgery     Time spent: 20 minutes  Author: Toribio Door, MD 04/07/2024 9:35 AM  For on call review [email or web address].

## 2024-04-07 NOTE — Progress Notes (Signed)
     Subjective:  Patient remains lethargic but more alert and more responsive to questions. She refused all PO meds yseterday but she did tell me this morning she would take her medications today. Did call out for pain meds last night and got 1mg  IV dilaudid . Family sleeping at bedside.  Hgb 9.2. Medicne following.  Objective:   VITALS:   Vitals:   04/06/24 0506 04/06/24 1344 04/06/24 2134 04/07/24 0534  BP: (!) 158/80 (!) 143/97 (!) 173/92 (!) 143/95  Pulse:  99 (!) 103 98  Resp: 16 20 15 12   Temp: 100.3 F (37.9 C) 98.9 F (37.2 C) 98.9 F (37.2 C) 98.3 F (36.8 C)  TempSrc:  Oral Oral Oral  SpO2: 96% 99% 93% 93%  Weight:      Height:        Sensation intact distally Intact pulses distally Dorsiflexion/Plantar flexion intact Incision: dressing C/D/I Compartment soft    Lab Results  Component Value Date   WBC 10.2 04/07/2024   HGB 9.2 (L) 04/07/2024   HCT 29.3 (L) 04/07/2024   MCV 82.5 04/07/2024   PLT 103 (L) 04/07/2024   BMET    Component Value Date/Time   NA 137 04/07/2024 0318   NA 139 10/21/2023 1054   K 3.4 (L) 04/07/2024 0318   CL 105 04/07/2024 0318   CO2 22 04/07/2024 0318   GLUCOSE 104 (H) 04/07/2024 0318   BUN 10 04/07/2024 0318   BUN 8 10/21/2023 1054   CREATININE 0.68 04/07/2024 0318   CALCIUM  7.8 (L) 04/07/2024 0318   CALCIUM  8.3 (L) 08/28/2010 0525   EGFR 91 10/21/2023 1054   GFRNONAA >60 04/07/2024 0318    Xray: Antibiotic spacer components in good position no adverse features  Assessment/Plan: 3 Days Post-Op   Principal Problem:   Failed total hip arthroplasty (HCC)  Status post right explant antibiotic spacer placement for chronic prosthetic joint infection  Post op recs: WB: 20% weightbearing right lower extremity, posterior hip precautions Abx: Currently advised broad-spectrum antibiotics with daptomycin  and ceftriaxone  per infectious disease.  Recs are appreciated.  IntraOp cultures with 1 out of 4 showing rare  gram-positive cocci we will continue to follow cultures for growth. imaging: PACU pelvis Xray Dressing: dermabond and aquacell dressing DVT prophylaxis: Eliquis  2.5 mg postop day 1 Follow up: 1 week after surgery for a wound check with Dr. Edna at [facility].  Address: [address], [place], [place] [address]  Office Phone: [phone]   TORIBIO DELENA EDNA 04/07/2024, 7:12 AM   TORIBIO Edna, MD  Contact information:   [phone] 7am-5pm epic message Dr. Edna, or call office for patient follow up: [phone] After hours and holidays please check [email or web address] for group call information for [facility]

## 2024-04-08 DIAGNOSIS — G9340 Encephalopathy, unspecified: Secondary | ICD-10-CM | POA: Diagnosis not present

## 2024-04-08 DIAGNOSIS — Z96649 Presence of unspecified artificial hip joint: Secondary | ICD-10-CM | POA: Diagnosis not present

## 2024-04-08 DIAGNOSIS — T84018D Broken internal joint prosthesis, other site, subsequent encounter: Secondary | ICD-10-CM | POA: Diagnosis not present

## 2024-04-08 DIAGNOSIS — D696 Thrombocytopenia, unspecified: Secondary | ICD-10-CM | POA: Insufficient documentation

## 2024-04-08 DIAGNOSIS — D649 Anemia, unspecified: Secondary | ICD-10-CM | POA: Insufficient documentation

## 2024-04-08 LAB — CBC
HCT: 28.7 % — ABNORMAL LOW (ref 36.0–46.0)
Hemoglobin: 8.7 g/dL — ABNORMAL LOW (ref 12.0–15.0)
MCH: 25.5 pg — ABNORMAL LOW (ref 26.0–34.0)
MCHC: 30.3 g/dL (ref 30.0–36.0)
MCV: 84.2 fL (ref 80.0–100.0)
Platelets: 116 K/uL — ABNORMAL LOW (ref 150–400)
RBC: 3.41 MIL/uL — ABNORMAL LOW (ref 3.87–5.11)
RDW: 16 % — ABNORMAL HIGH (ref 11.5–15.5)
WBC: 7.5 K/uL (ref 4.0–10.5)
nRBC: 0 % (ref 0.0–0.2)

## 2024-04-08 LAB — BASIC METABOLIC PANEL WITH GFR
Anion gap: 10 (ref 5–15)
BUN: 11 mg/dL (ref 6–20)
CO2: 22 mmol/L (ref 22–32)
Calcium: 7.8 mg/dL — ABNORMAL LOW (ref 8.9–10.3)
Chloride: 107 mmol/L (ref 98–111)
Creatinine, Ser: 0.69 mg/dL (ref 0.44–1.00)
GFR, Estimated: >60 mL/min (ref >60)
Glucose, Bld: 89 mg/dL (ref 70–99)
Potassium: 3.5 mmol/L (ref 3.5–5.1)
Sodium: 139 mmol/L (ref 135–145)

## 2024-04-08 NOTE — Progress Notes (Signed)
     Subjective:  Patient remains lethargic but did finally take all her PO meds yesterday and worked with PT. Only able to sit up at the side of bed. Family at beside. Encouraged her to continue to take her meds and mobilize with PT.  Objective:   VITALS:   Vitals:   04/07/24 1524 04/07/24 2122 04/07/24 2205 04/08/24 0522  BP: (!) 131/93 (!) 179/107 (!) 151/93 131/74  Pulse: (!) 101 (!) 107  79  Resp:  15  16  Temp:  98.7 F (37.1 C)  97.7 F (36.5 C)  TempSrc:  Oral    SpO2:  98%  100%  Weight:      Height:        Sensation intact distally Intact pulses distally Dorsiflexion/Plantar flexion intact Incision: dressing C/D/I Compartment soft    Lab Results  Component Value Date   WBC 7.5 04/08/2024   HGB 8.7 (L) 04/08/2024   HCT 28.7 (L) 04/08/2024   MCV 84.2 04/08/2024   PLT 116 (L) 04/08/2024   BMET    Component Value Date/Time   NA 139 04/08/2024 0325   NA 139 10/21/2023 1054   K 3.5 04/08/2024 0325   CL 107 04/08/2024 0325   CO2 22 04/08/2024 0325   GLUCOSE 89 04/08/2024 0325   BUN 11 04/08/2024 0325   BUN 8 10/21/2023 1054   CREATININE 0.69 04/08/2024 0325   CALCIUM  7.8 (L) 04/08/2024 0325   CALCIUM  8.3 (L) 08/28/2010 0525   EGFR 91 10/21/2023 1054   GFRNONAA >60 04/08/2024 0325    Xray: Antibiotic spacer components in good position no adverse features  Assessment/Plan: 4 Days Post-Op   Principal Problem:   Failed total hip arthroplasty (HCC)  Status post right explant antibiotic spacer placement for chronic prosthetic joint infection  Post op recs: WB: 20% weightbearing right lower extremity, posterior hip precautions Abx: Currently advised broad-spectrum antibiotics with daptomycin  and ceftriaxone  per infectious disease.  Recs are appreciated.  IntraOp cultures with 1 out of 4 showing rare gram-positive cocci we will continue to follow cultures for growth. imaging: PACU pelvis Xray Dressing: dermabond and aquacell dressing DVT prophylaxis:  Eliquis  2.5 mg postop day 1 Follow up: 1 week after surgery for a wound check with Dr[facility] Edna at [facility].  Address: [address], [place], [place] [address]  Office Phone: [phone]   TORIBIO DELENA EDNA 04/08/2024, 7:41 AM   TORIBIO Edna, MD  Contact information:   [phone] 7am-5pm epic message Dr. Edna, or call office for patient follow up: [phone] After hours and holidays please check [email or web address] for group call information for [facility]

## 2024-04-08 NOTE — Progress Notes (Addendum)
 Progress Note   Patient: Ashley [name] FMW:[id number] DOB: July 10, 1976 DOA: 04/04/2024     4 DOS: the patient was seen and examined on 04/08/2024   Brief hospital course: 48 year old woman PMH sleeve gastrectomy, multiple joint surgeries, chronic pain syndrome, admitted for semielective right THA explant for chronic prosthetic joint infection.  Status post surgery 7/16.  7/17 patient developed lethargy and hospital service was consulted for further evaluation.  Primary service: Orthopedics Admit 7/16 for surgery  Procedures/Events 7/16 Right hip stage I revision explantation and debridement of right hip with implantation of antibiotic articulating hip spacer    Assessment and Plan: Lethargy Acute encephalopathy Workup unrevealing including CT head negative, serum ammonia within normal limits, BMP unremarkable. CXR left perihilar infiltrate but no clinical signs or symptoms of infection. Continues to slowly improve, more awake, alert and interactive.  Affect is odd and responses, quickly at times and other times almost seems like questions are ignored.  Suspect multifactorial including postanesthesia, nonorganic etiology seems most likely at this point.  I do not think any further imaging would be fruitful. Though clinically improving, not back to baseline and cognition appears to be off.  Have asked neurology to see per family request.   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 Continue antibiotics per ID   Thrombocytopenia Normocytic anemia Thrombocytopenia perioperative in nature, improved today, stable. Hemoglobin slightly lower but probably normal variation   Hypertension: BP is better today.  Continue Coreg , HCTZ, losartan     Chronic pain syndrome: Home regimen includes Percocet 10-325 q6h prn, fill consistently per PDMP.  Has not been taking oral pain meds due to mentation and will be at risk for  withdrawal.  Can utilize IV Dilaudid  as already ordered for pain control/sign of withdrawal.   Depression/anxiety: Continue Wellbutrin .  Seems to be improving, neurology evaluations today.  Hopefully medically ready for discharge in the next day or 2.  Further management per surgery.     Subjective:  Family concerned but report pt is more talkative today Still confused  Doesn't know why she is here  Physical Exam: Vitals:   04/07/24 1524 04/07/24 2122 04/07/24 2205 04/08/24 0522  BP: (!) 131/93 (!) 179/107 (!) 151/93 131/74  Pulse: (!) 101 (!) 107  79  Resp:  15  16  Temp:  98.7 F (37.1 C)  97.7 F (36.5 C)  TempSrc:  Oral    SpO2:  98%  100%  Weight:      Height:       Physical Exam Vitals reviewed.  Constitutional:      General: She is not in acute distress.    Appearance: She is not ill-appearing or toxic-appearing.  Cardiovascular:     Rate and Rhythm: Normal rate and regular rhythm.     Heart sounds: No murmur heard. Pulmonary:     Effort: Pulmonary effort is normal. No respiratory distress.     Breath sounds: No wheezing, rhonchi or rales.  Musculoskeletal:     Right lower leg: No edema.     Left lower leg: No edema.  Neurological:     General: No focal deficit present.     Mental Status: She is alert. She is disoriented.     Cranial Nerves: No cranial nerve deficit.     Motor: No weakness.  Psychiatric:        Attention and Perception: She is inattentive.        Mood and Affect: Affect is  flat.        Speech: Speech is delayed.        Behavior: Behavior is slowed. Behavior is cooperative.        Cognition and Memory: Cognition is impaired.     Comments: Slow to respond, but recognizes mother, sister, friend. Answers some of their questions appropriately. Verbal interaction more appropriate with family today though responses are vaiable     Data Reviewed: BMP stable Hgb stable 8.7 Plts up to 116  Family Communication: mother, sister, friend at  bedside  Disposition: Status is: Inpatient Remains inpatient appropriate because: odd affect, post surgery     Time spent: 20 minutes  Author: Toribio Door, MD 04/08/2024 8:51 AM  For on call review [email or web address].

## 2024-04-08 NOTE — Progress Notes (Signed)
 Physical Therapy Treatment Patient Details Name: Ashley [name] MRN: [id number] DOB: [birth year] Today's Date: 04/08/2024   History of Present Illness Ashley [name] is a 48 y.o. F admitted 04/04/2024 for a planned explant of right hip hardware with antibiotic spacer placement for chronic prosthetic joint infection. PMH:  HTN, depression/anxiety, s/p sleeve gastrectomy, s/p right THA 2017, chronic pain syndrome    PT Comments  Pt cooperative but following simple cues inconsistently.  Pt continues to require significant assist of 2-3 for basic bed mobility tasks but noted improvement with attempts to self assist and pt maintained sitting EOB x 15 min with CGA only.  Standing deferred for safety reasons 2* pts inconsistent participation and follow through with cues.    If plan is discharge home, recommend the following: Two people to help with walking and/or transfers;Two people to help with bathing/dressing/bathroom;Assistance with cooking/housework;Assist for transportation;Help with stairs or ramp for entrance   Can travel by private vehicle        Equipment Recommendations  Rolling walker (2 wheels)    Recommendations for Other Services       Precautions / Restrictions Precautions Precautions: Posterior Hip Precaution Booklet Issued: Yes (comment) Recall of Precautions/Restrictions: Impaired Restrictions Weight Bearing Restrictions Per Provider Order: Yes RLE Weight Bearing Per Provider Order: Partial weight bearing RLE Partial Weight Bearing Percentage or Pounds: 20% per order Other Position/Activity Restrictions: posterior hip     Mobility  Bed Mobility Overal bed mobility: Needs Assistance Bed Mobility: Supine to Sit, Sit to Supine     Supine to sit: HOB elevated, Used rails, Max assist Sit to supine: Total assist   General bed mobility comments: +3 max to total assist for all aspects of bed mobility, use of bed pad to maintain hip precautions and bring hips EOB. Pt did  attempt to help with rail. Pt crying and moaning during transition    Transfers                   General transfer comment: NT this session for safety - pt command follow through and participation continues limited and variable    Ambulation/Gait                   Stairs             Wheelchair Mobility     Tilt Bed    Modified Rankin (Stroke Patients Only)       Balance Overall balance assessment: Needs assistance Sitting-balance support: Feet supported, Single extremity supported Sitting balance-Leahy Scale: Fair Sitting balance - Comments: Pt sitting with CGA only and cues for wt shift to maintain balance Postural control: Posterior lean                                  Communication Communication Communication: Impaired Factors Affecting Communication: Other (comment) (Minimal verbalization but increased from previous session)  Cognition Arousal: Lethargic Behavior During Therapy: Flat affect   PT - Cognitive impairments: Difficult to assess Difficult to assess due to: Impaired communication                     PT - Cognition Comments: Min verbalization from pt but increased from last session Following commands: Impaired Following commands impaired: Follows one step commands inconsistently, Follows one step commands with increased time    Cueing Cueing Techniques: Verbal cues, Gestural cues, Tactile cues  Exercises General Exercises -  Lower Extremity Ankle Circles/Pumps: AROM, Both, 15 reps, Supine Heel Slides: AAROM, 15 reps, Supine, Left (Attempted on R but pt resistant and tearful)    General Comments        Pertinent Vitals/Pain Pain Assessment Pain Assessment: Faces Faces Pain Scale: Hurts whole lot Pain Location: bil LEs with movement - R>L Pain Descriptors / Indicators: Crying, Grimacing, Guarding, Moaning Pain Intervention(s): Limited activity within patient's tolerance, Monitored during session,  Premedicated before session, Ice applied    Home Living                          Prior Function            PT Goals (current goals can now be found in the care plan section) Acute Rehab PT Goals Patient Stated Goal: No goals expressed by pt. PT Goal Formulation: With patient/family Time For Goal Achievement: 04/20/24 Potential to Achieve Goals: Fair Progress towards PT goals: Progressing toward goals    Frequency    7X/week      PT Plan      Co-evaluation PT/OT/SLP Co-Evaluation/Treatment: Yes            AM-PAC PT 6 Clicks Mobility   Outcome Measure  Help needed turning from your back to your side while in a flat bed without using bedrails?: Total Help needed moving from lying on your back to sitting on the side of a flat bed without using bedrails?: Total Help needed moving to and from a bed to a chair (including a wheelchair)?: Total Help needed standing up from a chair using your arms (e.g., wheelchair or bedside chair)?: Total Help needed to walk in hospital room?: Total Help needed climbing 3-5 steps with a railing? : Total 6 Click Score: 6    End of Session   Activity Tolerance: Patient limited by fatigue;Patient limited by pain Patient left: in bed;with call bell/phone within reach;with family/visitor present;with bed alarm set Nurse Communication: Mobility status;Need for lift equipment PT Visit Diagnosis: Difficulty in walking, not elsewhere classified (R26.2);Pain;Muscle weakness (generalized) (M62.81) Pain - Right/Left: Right Pain - part of body: Hip     Time: 1353-1431 PT Time Calculation (min) (ACUTE ONLY): 38 min  Charges:    $Therapeutic Activity: 38-52 mins PT General Charges $$ ACUTE PT VISIT: 1 Visit                     [facility] PT Acute Rehabilitation Services Office [phone]    Chiann Goffredo 04/08/2024, 3:28 PM

## 2024-04-08 NOTE — Plan of Care (Signed)
  Problem: Clinical Measurements: Goal: Ability to maintain clinical measurements within normal limits will improve Outcome: Progressing   Problem: Activity: Goal: Risk for activity intolerance will decrease Outcome: Progressing   Problem: Pain Managment: Goal: General experience of comfort will improve and/or be controlled Outcome: Progressing   Problem: Safety: Goal: Ability to remain free from injury will improve Outcome: Progressing   Problem: Pain Management: Goal: Pain level will decrease with appropriate interventions Outcome: Progressing

## 2024-04-08 NOTE — Plan of Care (Signed)
   Problem: Education: Goal: Knowledge of General Education information will improve Description: Including pain rating scale, medication(s)/side effects and non-pharmacologic comfort measures Outcome: Not Progressing   Problem: Health Behavior/Discharge Planning: Goal: Ability to manage health-related needs will improve Outcome: Not Progressing   Problem: Clinical Measurements: Goal: Ability to maintain clinical measurements within normal limits will improve Outcome: Not Progressing   Problem: Activity: Goal: Risk for activity intolerance will decrease Outcome: Not Progressing   Problem: Nutrition: Goal: Adequate nutrition will be maintained Outcome: Not Progressing

## 2024-04-09 DIAGNOSIS — D696 Thrombocytopenia, unspecified: Secondary | ICD-10-CM | POA: Diagnosis not present

## 2024-04-09 DIAGNOSIS — B9689 Other specified bacterial agents as the cause of diseases classified elsewhere: Secondary | ICD-10-CM | POA: Diagnosis not present

## 2024-04-09 DIAGNOSIS — G894 Chronic pain syndrome: Secondary | ICD-10-CM

## 2024-04-09 DIAGNOSIS — G9340 Encephalopathy, unspecified: Secondary | ICD-10-CM | POA: Diagnosis not present

## 2024-04-09 DIAGNOSIS — D649 Anemia, unspecified: Secondary | ICD-10-CM | POA: Diagnosis not present

## 2024-04-09 DIAGNOSIS — T8489XA Other specified complication of internal orthopedic prosthetic devices, implants and grafts, initial encounter: Secondary | ICD-10-CM | POA: Diagnosis not present

## 2024-04-09 DIAGNOSIS — T8450XA Infection and inflammatory reaction due to unspecified internal joint prosthesis, initial encounter: Secondary | ICD-10-CM | POA: Diagnosis not present

## 2024-04-09 DIAGNOSIS — R410 Disorientation, unspecified: Secondary | ICD-10-CM | POA: Diagnosis not present

## 2024-04-09 LAB — AEROBIC/ANAEROBIC CULTURE W GRAM STAIN (SURGICAL/DEEP WOUND)
Culture: NO GROWTH
Gram Stain: NONE SEEN

## 2024-04-09 MED ORDER — OXYCODONE HCL 5 MG PO TABS
5.0000 mg | ORAL_TABLET | ORAL | Status: DC | PRN
  Administered 2024-04-09 – 2024-04-10 (×4): 5 mg via ORAL
  Administered 2024-04-10: 10 mg via ORAL
  Administered 2024-04-10 – 2024-04-14 (×10): 5 mg via ORAL
  Administered 2024-04-15 (×2): 10 mg via ORAL
  Administered 2024-04-17: 5 mg via ORAL
  Administered 2024-04-17 – 2024-04-18 (×2): 10 mg via ORAL
  Filled 2024-04-09 (×2): qty 2
  Filled 2024-04-09 (×8): qty 1
  Filled 2024-04-09: qty 2
  Filled 2024-04-09 (×2): qty 1
  Filled 2024-04-09: qty 2
  Filled 2024-04-09 (×6): qty 1
  Filled 2024-04-09 (×2): qty 2
  Filled 2024-04-09: qty 1

## 2024-04-09 MED ORDER — ENSURE PLUS HIGH PROTEIN PO LIQD
237.0000 mL | Freq: Two times a day (BID) | ORAL | Status: DC
  Administered 2024-04-09 – 2024-04-18 (×3): 237 mL via ORAL

## 2024-04-09 NOTE — Consult Note (Signed)
 [facility] Psychiatric Consult Initial  Patient Name: [name]  MRN: [id number]  DOB: [birth year]  Consult Order details:  Orders (From admission, onward)     Start     Ordered   04/09/24 0752  IP CONSULT TO PSYCHIATRY       Ordering Provider: Jadine Toribio SQUIBB, MD  Provider:  (Not yet assigned)  Question Answer Comment  Location [facility]   Reason for Consult? variable confusion, suspect nonorganic, amnesia, lethargy (nonorganic)      04/09/24 0751             Mode of Visit: In person    Psychiatry Consult Evaluation  Service Date: April 09, 2024 LOS:  LOS: 5 days  Chief Complaint Delirium  Primary Psychiatric Diagnoses  Delirium  Assessment  Ashley [name] is a 48 y.o. female admitted: Medicallyfor 04/04/2024  8:30 AM for Total Hip Arthroplasty. She carries the psychiatric diagnoses of ADHD.   Her current presentation of Confusion and lethargy is most consistent with Delirium. She meets criteria for Delirium based on DSM V Criteria.  Current outpatient psychotropic medications include Vyvanse  50 mg PO daily and historically she has had a positive response to this medication. She was compliant with medications prior to admission as evidenced by history. On initial examination, patient was seen laying in bed this morning accompanied by her mother at bedside. The patient was oriented to self and being in the hospital but she did not know why she was in the hospital and she was unable to accurately report her age. History was obtained from the patient's mother at bedside. She reported that the patient has been confused since returning from surgery. This is concerning because she has never had post surgical delirium in the past and she has another surgery in the next two months. The patient has been lethargic but she is slowly improving and starting to eat on her own and speak like her normal self at times. She does not believe that the patient needs  any additional medications and the main concern is that she is overly sedated. Please see plan below for detailed recommendations.   Diagnoses:  Active Hospital problems: Principal Problem:   Failed total hip arthroplasty (HCC) Active Problems:   Essential hypertension   Depressive disorder   Chronic pain syndrome   Acute encephalopathy   Thrombocytopenia (HCC)   Normocytic anemia    Plan   ## Psychiatric Medication Recommendations:  -Agree with Vyvanse  50 mg PO daily -Hold Ambien  -Consider holding Benadryl  and Lyrica  -Opiate pain medications can also be contributing to lethargy and confusion  ## Medical Decision Making Capacity: Not specifically addressed in this encounter  ## Further Work-up:  -- Per Primary Team -- Pertinent labwork reviewed earlier this admission includes:  Recent Results (from the past 2160 hours)  CBC WITH DIFFERENTIAL     Status: Abnormal   Collection Time: 03/22/24 11:30 AM  Result Value Ref Range   WBC 8.0 4.0 - 10.5 K/uL   RBC 4.47 3.87 - 5.11 MIL/uL   Hemoglobin 11.5 (L) 12.0 - 15.0 g/dL   HCT 62.4 63.9 - 53.9 %   MCV 83.9 80.0 - 100.0 fL   MCH 25.7 (L) 26.0 - 34.0 pg   MCHC 30.7 30.0 - 36.0 g/dL   RDW 82.7 (H) 88.4 - 84.4 %   Platelets 201 150 - 400 K/uL   nRBC 0.0 0.0 - 0.2 %   Neutrophils Relative % 64 %   Neutro  Abs 5.2 1.7 - 7.7 K/uL   Lymphocytes Relative 23 %   Lymphs Abs 1.8 0.7 - 4.0 K/uL   Monocytes Relative 10 %   Monocytes Absolute 0.8 0.1 - 1.0 K/uL   Eosinophils Relative 2 %   Eosinophils Absolute 0.1 0.0 - 0.5 K/uL   Basophils Relative 1 %   Basophils Absolute 0.1 0.0 - 0.1 K/uL   Immature Granulocytes 0 %   Abs Immature Granulocytes 0.02 0.00 - 0.07 K/uL    Comment: Performed at [facility], [address], [place], [place] [address]  Comprehensive metabolic panel     Status: Abnormal   Collection Time: 03/22/24 11:30 AM  Result Value Ref Range   Sodium 138 135 - 145 mmol/L   Potassium 3.9 3.5 -  5.1 mmol/L   Chloride 103 98 - 111 mmol/L   CO2 27 22 - 32 mmol/L   Glucose, Bld 83 70 - 99 mg/dL    Comment: Glucose reference range applies only to samples taken after fasting for at least 8 hours.   BUN 13 6 - 20 mg/dL   Creatinine, Ser 9.02 0.44 - 1.00 mg/dL   Calcium  8.6 (L) 8.9 - 10.3 mg/dL   Total Protein 7.7 6.5 - 8.1 g/dL   Albumin 3.5 3.5 - 5.0 g/dL   AST 21 15 - 41 U/L   ALT 21 0 - 44 U/L   Alkaline Phosphatase 72 38 - 126 U/L   Total Bilirubin 1.0 0.0 - 1.2 mg/dL   GFR, Estimated >39 >39 mL/min    Comment: (NOTE) Calculated using the CKD-EPI Creatinine Equation (2021)    Anion gap 8 5 - 15    Comment: Performed at [facility], [address], [place], [place] [address]  Type and screen Order type and screen if day of surgery is less than 15 days from draw of preadmission visit or order morning of surgery if day of surgery is greater than 6 days from preadmission visit.     Status: None   Collection Time: 03/22/24 11:30 AM  Result Value Ref Range   ABO/RH(D) B POS    Antibody Screen NEG    Sample Expiration 04/05/2024,2359    Extend sample reason      NO TRANSFUSIONS OR PREGNANCY IN THE PAST 3 MONTHS Performed at [facility], [address] [place], [place] [address]   Surgical pcr screen     Status: None   Collection Time: 03/22/24 12:20 PM   Specimen: Nasal Mucosa; Nasal Swab  Result Value Ref Range   MRSA, PCR NEGATIVE NEGATIVE   Staphylococcus aureus NEGATIVE NEGATIVE    Comment: (NOTE) The Xpert SA Assay (FDA approved for NASAL specimens in patients 73 years of age and older), is one component of a comprehensive surveillance program. It is not intended to diagnose infection nor to guide or monitor treatment. Performed at [facility], [address], [place], [place] [address]   Pregnancy, urine POC     Status: None   Collection Time: 04/04/24  8:46 AM  Result Value Ref Range   Preg Test, Ur  NEGATIVE NEGATIVE    Comment:        THE SENSITIVITY OF THIS METHODOLOGY IS >20 mIU/mL.   Fungus Culture With Stain     Status: None (Preliminary result)   Collection Time: 04/04/24 12:38 PM   Specimen: Synovial, Right Hip; Body Fluid  Result Value Ref Range   Fungus Stain Final report  Comment: (NOTE) Performed At: [facility] [address] [place], [place] [id number] Jennette Shorter MD Ey:[id number]    Fungus (Mycology) Culture PENDING    Fungal Source TISSUE     Comment: RIGHT HIP 1A Performed at [facility], [address] [place], [place] [address]   Aerobic/Anaerobic Culture w Gram Stain (surgical/deep wound)     Status: None   Collection Time: 04/04/24 12:38 PM   Specimen: Synovial, Right Hip; Body Fluid  Result Value Ref Range   Specimen Description      TISSUE Performed at [facility], [address], [place], [place] [address]    Special Requests R HIP SAMPLE 1A    Gram Stain NO WBC SEEN NO ORGANISMS SEEN     Culture      No growth aerobically or anaerobically. Performed at [facility], [address], [place], [place] [address]    Report Status 04/09/2024 FINAL   Fungus Culture Result     Status: None   Collection Time: 04/04/24 12:38 PM  Result Value Ref Range   Result 1 Comment     Comment: (NOTE) KOH/Calcofluor preparation:  no fungus observed. Performed At: [facility] [address] [place], [place] [id number] Jennette Shorter MD Ey:[id number]   Aerobic/Anaerobic Culture w Gram Stain (surgical/deep wound)     Status: None (Preliminary result)   Collection Time: 04/04/24 12:46 PM   Specimen: Synovial, Right Hip; Body Fluid  Result Value Ref Range   Specimen Description      TISSUE Performed at [facility], [address], [place], [place] [address]    Special Requests R HIP SAMPLE B HOLD 2WKS    Gram Stain NO WBC SEEN NO ORGANISMS SEEN     Culture      NO GROWTH 5  DAYS CONTINUING TO HOLD Performed at [facility], [address], [place], [place] [address]    Report Status PENDING   Fungus Culture With Stain     Status: None   Collection Time: 04/04/24 12:49 PM   Specimen: Synovial, Right Hip; Body Fluid  Result Value Ref Range   Fungus Stain Final report     Comment: (NOTE) Performed At: [facility] [address] [place], [place] [id number] Jennette Shorter MD Ey:[id number]    Fungus (Mycology) Culture PENDING    Fungal Source RT HIP SAMPLE C     Comment: Performed at [facility], [address] [place], [place] [address] CORRECTED ON 07/16 AT 2115: PREVIOUSLY REPORTED AS TISSUE RIGHT HIP   Aerobic/Anaerobic Culture w Gram Stain (surgical/deep wound)     Status: None (Preliminary result)   Collection Time: 04/04/24 12:49 PM   Specimen: Synovial, Right Hip; Body Fluid  Result Value Ref Range   Specimen Description      TISSUE Performed at [facility], [address] [place], [place] [address]    Special Requests      R HIP Performed at [facility], [address], [place], [place] [address]    Gram Stain NO WBC SEEN RARE GRAM POSITIVE COCCI     Culture      NO GROWTH 5 DAYS CONTINUING TO HOLD Performed at [facility], [address], [place], [place] [address]    Report Status PENDING   Fungus Culture Result     Status: None   Collection Time: 04/04/24 12:49 PM  Result Value Ref Range  Result 1 Comment     Comment: (NOTE) KOH/Calcofluor preparation:  no fungus observed. Performed At: [facility] [address] [place], [place] [id number] Jennette Shorter MD Ey:[id number]   Fungus Culture With Stain     Status: None (Preliminary result)   Collection Time: 04/04/24  1:31 PM   Specimen: Path Tissue  Result Value Ref Range   Fungus Stain Final report     Comment: (NOTE) Performed At: [facility] [address] [place], [place]  [id number] Jennette Shorter MD Ey:[id number]    Fungus (Mycology) Culture PENDING    Fungal Source TISSUE     Comment: RIGHT HIP Performed at [facility], [address] [place], [place] [address]   Aerobic/Anaerobic Culture w Gram Stain (surgical/deep wound)     Status: None (Preliminary result)   Collection Time: 04/04/24  1:31 PM   Specimen: Path Tissue  Result Value Ref Range   Specimen Description      TISSUE Performed at [facility], [address] [place], [place] [address]    Special Requests R HIP SAMPLE D HOLD 14 DAYSQ    Gram Stain      FEW WBC PRESENT,BOTH PMN AND MONONUCLEAR NO ORGANISMS SEEN    Culture      NO GROWTH 5 DAYS CONTINUING TO HOLD Performed at [facility], [address], [place], [place] [address]    Report Status PENDING   Fungus Culture Result     Status: None   Collection Time: 04/04/24  1:31 PM  Result Value Ref Range   Result 1 Comment     Comment: (NOTE) KOH/Calcofluor preparation:  no fungus observed. Performed At: [facility] [address] [place], [place] [id number] Jennette Shorter MD Ey:[id number]   CK     Status: Abnormal   Collection Time: 04/05/24  5:32 AM  Result Value Ref Range   Total CK 824 (H) 38 - 234 U/L    Comment: Performed at [facility], [address], [place], [place] [address]  CBC     Status: Abnormal   Collection Time: 04/05/24  5:32 AM  Result Value Ref Range   WBC 13.1 (H) 4.0 - 10.5 K/uL   RBC 4.04 3.87 - 5.11 MIL/uL   Hemoglobin 10.5 (L) 12.0 - 15.0 g/dL   HCT 66.4 (L) 63.9 - 53.9 %   MCV 82.9 80.0 - 100.0 fL   MCH 26.0 26.0 - 34.0 pg   MCHC 31.3 30.0 - 36.0 g/dL   RDW 83.5 (H) 88.4 - 84.4 %   Platelets 124 (L) 150 - 400 K/uL   nRBC 0.0 0.0 - 0.2 %    Comment: Performed at [facility], [address] [place], [place] [address]  Basic metabolic panel     Status: Abnormal   Collection Time: 04/05/24  5:32 AM   Result Value Ref Range   Sodium 136 135 - 145 mmol/L   Potassium 3.5 3.5 - 5.1 mmol/L   Chloride 103 98 - 111 mmol/L   CO2 25 22 - 32 mmol/L   Glucose, Bld 146 (H) 70 - 99 mg/dL    Comment: Glucose reference range applies only to samples taken after fasting for at least 8 hours.   BUN 14 6 - 20 mg/dL   Creatinine, Ser 9.00 0.44 - 1.00 mg/dL   Calcium  7.9 (L) 8.9 - 10.3 mg/dL   GFR, Estimated >39 >39 mL/min    Comment: (NOTE) Calculated using  the CKD-EPI Creatinine Equation (2021)    Anion gap 8 5 - 15    Comment: Performed at [facility], [address], [place], [place] [address]  CBC     Status: Abnormal   Collection Time: 04/05/24  2:00 PM  Result Value Ref Range   WBC 12.4 (H) 4.0 - 10.5 K/uL   RBC 4.24 3.87 - 5.11 MIL/uL   Hemoglobin 10.9 (L) 12.0 - 15.0 g/dL   HCT 64.3 (L) 63.9 - 53.9 %   MCV 84.0 80.0 - 100.0 fL   MCH 25.7 (L) 26.0 - 34.0 pg   MCHC 30.6 30.0 - 36.0 g/dL   RDW 83.3 (H) 88.4 - 84.4 %   Platelets 112 (L) 150 - 400 K/uL   nRBC 0.0 0.0 - 0.2 %    Comment: Performed at [facility], [address] [place], [place] [address]  Glucose, capillary     Status: Abnormal   Collection Time: 04/05/24  5:55 PM  Result Value Ref Range   Glucose-Capillary 122 (H) 70 - 99 mg/dL    Comment: Glucose reference range applies only to samples taken after fasting for at least 8 hours.  Comprehensive metabolic panel     Status: Abnormal   Collection Time: 04/05/24  7:24 PM  Result Value Ref Range   Sodium 138 135 - 145 mmol/L   Potassium 3.6 3.5 - 5.1 mmol/L   Chloride 105 98 - 111 mmol/L   CO2 24 22 - 32 mmol/L   Glucose, Bld 123 (H) 70 - 99 mg/dL    Comment: Glucose reference range applies only to samples taken after fasting for at least 8 hours.   BUN 13 6 - 20 mg/dL   Creatinine, Ser 8.97 (H) 0.44 - 1.00 mg/dL   Calcium  7.9 (L) 8.9 - 10.3 mg/dL   Total Protein 6.0 (L) 6.5 - 8.1 g/dL   Albumin 2.6 (L) 3.5 - 5.0 g/dL   AST 38  15 - 41 U/L   ALT 21 0 - 44 U/L   Alkaline Phosphatase 52 38 - 126 U/L   Total Bilirubin 0.7 0.0 - 1.2 mg/dL   GFR, Estimated >39 >39 mL/min    Comment: (NOTE) Calculated using the CKD-EPI Creatinine Equation (2021)    Anion gap 9 5 - 15    Comment: Performed at [facility], [address] [place], [place] [address]  Magnesium      Status: None   Collection Time: 04/05/24  7:24 PM  Result Value Ref Range   Magnesium  1.8 1.7 - 2.4 mg/dL    Comment: Performed at [facility], [address] [place], [place] [address]  Urinalysis, Routine w reflex microscopic -Urine, Clean Catch     Status: Abnormal   Collection Time: 04/05/24  7:39 PM  Result Value Ref Range   Color, Urine YELLOW YELLOW   APPearance CLEAR CLEAR   Specific Gravity, Urine 1.018 1.005 - 1.030   pH 5.0 5.0 - 8.0   Glucose, UA NEGATIVE NEGATIVE mg/dL   Hgb urine dipstick NEGATIVE NEGATIVE   Bilirubin Urine NEGATIVE NEGATIVE   Ketones, ur 20 (A) NEGATIVE mg/dL   Protein, ur NEGATIVE NEGATIVE mg/dL   Nitrite NEGATIVE NEGATIVE   Leukocytes,Ua TRACE (A) NEGATIVE   RBC / HPF 0-5 0 - 5 RBC/hpf   WBC, UA 0-5 0 - 5 WBC/hpf   Bacteria, UA NONE SEEN NONE SEEN   Squamous Epithelial / HPF 0-5 0 - 5 /HPF  Mucus PRESENT     Comment: Performed at [facility], [address] [place] [place], [place] [address]  Ammonia     Status: None   Collection Time: 04/05/24  7:51 PM  Result Value Ref Range   Ammonia 25 9 - 35 umol/L    Comment: Performed at [facility], [address], [place], [place] [address]  Blood gas, venous     Status: Abnormal   Collection Time: 04/05/24  7:52 PM  Result Value Ref Range   pH, Ven 7.44 (H) 7.25 - 7.43   pCO2, Ven 40 (L) 44 - 60 mmHg   pO2, Ven 46 (H) 32 - 45 mmHg   Bicarbonate 27.0 20.0 - 28.0 mmol/L   Acid-Base Excess 3.1 (H) 0.0 - 2.0 mmol/L   O2 Saturation 78.3 %   Patient temperature 38.1    Drawn by 7920      Comment: Performed at [facility], [address] [place], [place] [address]  CBC     Status: Abnormal   Collection Time: 04/06/24  3:19 AM  Result Value Ref Range   WBC 10.6 (H) 4.0 - 10.5 K/uL   RBC 3.77 (L) 3.87 - 5.11 MIL/uL   Hemoglobin 9.9 (L) 12.0 - 15.0 g/dL   HCT 68.8 (L) 63.9 - 53.9 %   MCV 82.5 80.0 - 100.0 fL   MCH 26.3 26.0 - 34.0 pg   MCHC 31.8 30.0 - 36.0 g/dL   RDW 83.4 (H) 88.4 - 84.4 %   Platelets 112 (L) 150 - 400 K/uL   nRBC 0.0 0.0 - 0.2 %    Comment: Performed at [facility], [address], [place], [place] [address]  Basic metabolic panel with GFR     Status: Abnormal   Collection Time: 04/06/24  3:19 AM  Result Value Ref Range   Sodium 139 135 - 145 mmol/L   Potassium 3.5 3.5 - 5.1 mmol/L   Chloride 106 98 - 111 mmol/L   CO2 23 22 - 32 mmol/L   Glucose, Bld 120 (H) 70 - 99 mg/dL    Comment: Glucose reference range applies only to samples taken after fasting for at least 8 hours.   BUN 12 6 - 20 mg/dL   Creatinine, Ser 9.38 0.44 - 1.00 mg/dL   Calcium  7.8 (L) 8.9 - 10.3 mg/dL   GFR, Estimated >39 >39 mL/min    Comment: (NOTE) Calculated using the CKD-EPI Creatinine Equation (2021)    Anion gap 10 5 - 15    Comment: Performed at [facility], [address] [place], [place] [address]  Comprehensive metabolic panel     Status: Abnormal   Collection Time: 04/07/24  3:18 AM  Result Value Ref Range   Sodium 137 135 - 145 mmol/L   Potassium 3.4 (L) 3.5 - 5.1 mmol/L   Chloride 105 98 - 111 mmol/L   CO2 22 22 - 32 mmol/L   Glucose, Bld 104 (H) 70 - 99 mg/dL    Comment: Glucose reference range applies only to samples taken after fasting for at least 8 hours.   BUN 10 6 - 20 mg/dL   Creatinine, Ser 9.31 0.44 - 1.00 mg/dL   Calcium  7.8 (L) 8.9 - 10.3 mg/dL   Total Protein 5.7 (L) 6.5 - 8.1 g/dL   Albumin 2.5 (L) 3.5 - 5.0 g/dL   AST 23 15 - 41 U/L   ALT 17 0 - 44 U/L  Alkaline Phosphatase 46 38  - 126 U/L   Total Bilirubin 0.9 0.0 - 1.2 mg/dL   GFR, Estimated >39 >39 mL/min    Comment: (NOTE) Calculated using the CKD-EPI Creatinine Equation (2021)    Anion gap 10 5 - 15    Comment: Performed at [facility], [address], [place], [place] [address]  CBC     Status: Abnormal   Collection Time: 04/07/24  3:18 AM  Result Value Ref Range   WBC 10.2 4.0 - 10.5 K/uL   RBC 3.55 (L) 3.87 - 5.11 MIL/uL   Hemoglobin 9.2 (L) 12.0 - 15.0 g/dL   HCT 70.6 (L) 63.9 - 53.9 %   MCV 82.5 80.0 - 100.0 fL   MCH 25.9 (L) 26.0 - 34.0 pg   MCHC 31.4 30.0 - 36.0 g/dL   RDW 83.7 (H) 88.4 - 84.4 %   Platelets 103 (L) 150 - 400 K/uL    Comment: REPEATED TO VERIFY   nRBC 0.0 0.0 - 0.2 %    Comment: Performed at [facility], [address] [place], [place] [address]  Magnesium      Status: None   Collection Time: 04/07/24  3:18 AM  Result Value Ref Range   Magnesium  1.8 1.7 - 2.4 mg/dL    Comment: Performed at [facility], [address] [place], [place] [address]  Phosphorus     Status: None   Collection Time: 04/07/24  3:18 AM  Result Value Ref Range   Phosphorus 2.9 2.5 - 4.6 mg/dL    Comment: Performed at [facility], [address], [place], [place] [address]  CK     Status: Abnormal   Collection Time: 04/07/24  3:18 AM  Result Value Ref Range   Total CK 484 (H) 38 - 234 U/L    Comment: Performed at [facility], [address] [place], [place] [address]  CBC     Status: Abnormal   Collection Time: 04/08/24  3:25 AM  Result Value Ref Range   WBC 7.5 4.0 - 10.5 K/uL   RBC 3.41 (L) 3.87 - 5.11 MIL/uL   Hemoglobin 8.7 (L) 12.0 - 15.0 g/dL   HCT 71.2 (L) 63.9 - 53.9 %   MCV 84.2 80.0 - 100.0 fL   MCH 25.5 (L) 26.0 - 34.0 pg   MCHC 30.3 30.0 - 36.0 g/dL   RDW 83.9 (H) 88.4 - 84.4 %   Platelets 116 (L) 150 - 400 K/uL    Comment: SPECIMEN CHECKED FOR CLOTS REPEATED TO VERIFY    nRBC  0.0 0.0 - 0.2 %    Comment: Performed at [facility], [address] [place], [place] [address]  Basic metabolic panel     Status: Abnormal   Collection Time: 04/08/24  3:25 AM  Result Value Ref Range   Sodium 139 135 - 145 mmol/L   Potassium 3.5 3.5 - 5.1 mmol/L   Chloride 107 98 - 111 mmol/L   CO2 22 22 - 32 mmol/L   Glucose, Bld 89 70 - 99 mg/dL    Comment: Glucose reference range applies only to samples taken after fasting for at least 8 hours.   BUN 11 6 - 20 mg/dL   Creatinine, Ser 9.30 0.44 - 1.00 mg/dL   Calcium  7.8 (L) 8.9 - 10.3 mg/dL   GFR, Estimated >39 >39 mL/min    Comment: (NOTE) Calculated using the CKD-EPI Creatinine Equation (2021)  Anion gap 10 5 - 15    Comment: Performed at [facility], [address] [place], [place] [address]      ## Disposition:-- There are no psychiatric contraindications to discharge at this time  ## Behavioral / Environmental: -Delirium Precautions: Delirium Interventions for Nursing and Staff: - RN to open blinds every AM. - To Bedside: Glasses, hearing aide, and pt's own shoes. Make available to patients. when possible and encourage use. - Encourage po fluids when appropriate, keep fluids within reach. - OOB to chair with meals. - Passive ROM exercises to all extremities with AM & PM care. - RN to assess orientation to person, time and place QAM and PRN. - Recommend extended visitation hours with familiar family/friends as feasible. - Staff to minimize disturbances at night. Turn off television when pt asleep or when not in use.    ## Safety and Observation Level:  - Based on my clinical evaluation, I estimate the patient to be at mild risk of self harm in the current setting. - At this time, we recommend  routine. This decision is based on my review of the chart including patient's history and current presentation, interview of the patient, mental status examination, and consideration of suicide  risk including evaluating suicidal ideation, plan, intent, suicidal or self-harm behaviors, risk factors, and protective factors. This judgment is based on our ability to directly address suicide risk, implement suicide prevention strategies, and develop a safety plan while the patient is in the clinical setting. Please contact our team if there is a concern that risk level has changed.  CSSR Risk Category:C-SSRS RISK CATEGORY: No Risk  Suicide Risk Assessment: Patient has following modifiable risk factors for suicide: NA, which we are addressing by NA. Patient has following non-modifiable or demographic risk factors for suicide: NA Patient has the following protective factors against suicide: Supportive family and Supportive friends  Thank you for this consult request. Recommendations have been communicated to the primary team.  We will sign off at this time.   Porfirio LITTIE Glatter, DO       History of Present Illness  Patient Report:  On initial examination, patient was seen laying in bed this morning accompanied by her mother at bedside. The patient was oriented to self and being in the hospital but she did not know why she was in the hospital and she was unable to accurately report her age. History was obtained from the patient's mother at bedside. She reported that the patient has been confused since returning from surgery. This is concerning because she has never had post surgical delirium in the past and she has another surgery in the next two months. The patient has been lethargic but she is slowly improving and starting to eat on her own and speak like her normal self at times. She does not believe that the patient needs any additional medications and the main concern is that she is overly sedated.   Psych ROS:  Depression: Denies Anxiety:  Denies Mania (lifetime and current): Denies Psychosis: (lifetime and current): Denies  ROS   Psychiatric and Social History  Psychiatric  History:  Information collected from Patient   Prev Dx/Sx: ADHD Current Psych Provider: Yes Home Meds (current): Vyvanse  50 mg PO daily and Wellbutrin  XL 150 mg PO daily Previous Med Trials: Vyvanse  and Wellbutrin  Therapy: None reported  Prior Psych Hospitalization: Denies  Prior Self Harm: Denies Prior Violence: Denies  Family Psych History: Unknown Family Hx suicide: Unknown  Social History:  Developmental Hx: Unremarkable Educational Hx: Some college Occupational Hx: Disabled Legal Hx: Denies Living Situation: Home with two children Spiritual Hx: None reported Access to weapons/lethal means: Denies   Substance History Alcohol : Denies  Type of alcohol  NA Last Drink NA Number of drinks per day NA History of alcohol  withdrawal seizures NA History of DT's NA Tobacco: Denies Illicit drugs: Denies Prescription drug abuse: Denies Rehab hx: NA  Exam Findings  Physical Exam:  Vital Signs:  Temp:  [98 F (36.7 C)-99.3 F (37.4 C)] 98 F (36.7 C) (07/21 0749) Pulse Rate:  [78-98] 78 (07/21 0749) Resp:  [16] 16 (07/21 0749) BP: (137-160)/(79-91) 160/91 (07/21 0749) SpO2:  [97 %-99 %] 97 % (07/21 0749) Blood pressure (!) 160/91, pulse 78, temperature 98 F (36.7 C), temperature source Oral, resp. rate 16, height 5' 11 (1.803 m), weight 117 kg, SpO2 97%. Body mass index is 35.98 kg/m.  Physical Exam  Mental Status Exam: General Appearance: Wearing hospital gown  Orientation:  Disoriented to situation, age, and place  Memory:  Immediate;   Poor Recent;   Poor Remote;   Fair  Concentration:  Concentration: Poor and Attention Span: Poor  Recall:  Poor  Attention  Poor  Eye Contact:  Poor  Speech:  Slow  Language:  Good  Volume:  Decreased  Mood: Fine  Affect:  Blunted  Thought Process:  Linear  Thought Content:  Mostly logical  Suicidal Thoughts:  No  Homicidal Thoughts:  No  Judgement:  Fair  Insight:  Poor  Psychomotor Activity:  Normal  Akathisia:  NA   Fund of Knowledge:  Fair      Assets:  Housing Social Support  Cognition:  Impaired,  Moderate  ADL's:  Intact  AIMS (if indicated):        Other History   These have been pulled in through the EMR, reviewed, and updated if appropriate.  Family History:  The patient's family history includes Breast cancer in her cousin and paternal grandmother; Cancer in her father, maternal grandmother, and paternal grandmother; Diabetes in her father and sister; Heart disease in her father, sister, and sister; Heart failure in her sister and sister; High blood pressure in her father; Kidney disease in her father.  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   Complication of anesthesia    Depression    History of pneumonia    Hypertension    Infertility, female    Joint pain    Lumbar spondylosis    Neuromuscular disease (HCC)    numbness, paresthesias RLE, from failed lumbar back surgery   Osteoarthritis    PCOS (polycystic ovarian syndrome)    Pneumonia    2016   PONV (postoperative nausea and vomiting)    Pre-diabetes    Swelling of lower extremity     Surgical History: Past Surgical History:  Procedure Laterality Date   ABDOMINAL EXPOSURE N/A 02/17/2018   Procedure: ABDOMINAL EXPOSURE;  Surgeon: Oris Krystal FALCON, MD;  Location: [facility] OR;  Service: Vascular;  Laterality: N/A;   ANKLE SURGERY Bilateral    x 2   ANTERIOR LAT LUMBAR FUSION N/A 02/17/2018   Procedure: Lumbar three-four Lumbar four-five  Anterolateral lumbar interbody fusion;  Surgeon: Colon Shove, MD;  Location: [facility] OR;  Service: Neurosurgery;  Laterality: N/A;   ANTERIOR LUMBAR FUSION N/A 02/17/2018   Procedure: Lumbar five-Sacral one Anterior lumbar interbody fusion;  Surgeon: Colon Shove, MD;  Location[facility];  Service: Neurosurgery;   Laterality: N/A;   APPLICATION OF ROBOTIC ASSISTANCE FOR SPINAL PROCEDURE N/A 02/17/2018   Procedure: APPLICATION OF ROBOTIC ASSISTANCE FOR SPINAL PROCEDURE;  Surgeon: Colon Shove, MD;  Location: [facility] OR;  Service: Neurosurgery;  Laterality: N/A;   APPLICATION OF ROBOTIC ASSISTANCE FOR SPINAL PROCEDURE N/A 06/09/2018   Procedure: APPLICATION OF ROBOTIC ASSISTANCE FOR SPINAL PROCEDURE;  Surgeon: Colon Shove, MD;  Location: [facility] OR;  Service: Neurosurgery;  Laterality: N/A;   BACK SURGERY     2017  discectomy   CARPAL TUNNEL RELEASE Left 07/04/2014   Procedure: LEFT CARPAL TUNNEL RELEASE;  Surgeon: Toribio JULIANNA Chancy, MD;  Location: [facility];  Service: Orthopedics;  Laterality: Left;   CARPAL TUNNEL RELEASE Right 08/08/2014   Procedure: RIGHT CARPAL TUNNEL RELEASE;  Surgeon: Toribio JULIANNA Chancy, MD;  Location: [facility];  Service: Orthopedics;  Laterality: Right;   COLONOSCOPY  2024   DILATION AND CURETTAGE OF UTERUS     DILATION AND EVACUATION  04/02/2011   Procedure: DILATATION AND EVACUATION (D&E);  Surgeon: Alm JAYSON Cook, MD;  Location: [facility];  Service: Gynecology;  Laterality: N/A;  dvt left mid thigh   DORSAL COMPARTMENT RELEASE Left 07/04/2014   Procedure: LEFT DEQUERVAINS;  Surgeon: Toribio JULIANNA Chancy, MD;  Location: [facility];  Service: Orthopedics;  Laterality: Left;   ENDOSCOPIC PLANTAR FASCIOTOMY Left 02/01/2002   HARDWARE REMOVAL Right 06/09/2018   Procedure: Repositioning of Right Lumbar three Right lumbar  four pedicle screw with MAZOR;  Surgeon: Colon Shove, MD;  Location: [facility] OR;  Service: Neurosurgery;  Laterality: Right;   HYSTEROSCOPY WITH D & C  07/17/2010   with exc. endometrial polyps   KNEE ARTHROSCOPY Right 03/07/2003; 01/14/2005; 08/31/2006   KNEE ARTHROSCOPY Left 03/21/2003; 11/26/2004   LUMBAR PERCUTANEOUS PEDICLE SCREW 3 LEVEL N/A 02/17/2018   Procedure: LUMBAR PERCUTANEOUS PEDICLE SCREW PLACEMENT LUMBAR THREE-SACRAL ONE;  Surgeon:  Colon Shove, MD;  Location: [facility] OR;  Service: Neurosurgery;  Laterality: N/A;   SHOULDER ARTHROSCOPY Right    TOE SURGERY Left 01/10/2003   claw toe correction 2nd, 3rd, 4th toes   TOTAL HIP ARTHROPLASTY Right 04/21/2016   Procedure: TOTAL HIP ARTHROPLASTY ANTERIOR APPROACH;  Surgeon: Toribio JULIANNA Chancy, MD;  Location: [facility] OR;  Service: Orthopedics;  Laterality: Right;   TOTAL HIP ARTHROPLASTY WITH HARDWARE REMOVAL Right 04/04/2024   Procedure: REVISION, ARTHROPLASTY, HIP;  Surgeon: Edna Toribio LABOR, MD;  Location: [facility];  Service: Orthopedics;  Laterality: Right;   TOTAL KNEE ARTHROPLASTY Left 01/21/2010   TOTAL KNEE ARTHROPLASTY Right 08/26/2008   UPPER GI ENDOSCOPY N/A 04/14/2021   Procedure: UPPER GI ENDOSCOPY;  Surgeon: Gladis Cough, MD;  Location: [facility];  Service: General;  Laterality: N/A;     Medications:   Current Facility-Administered Medications:    acetaminophen  (TYLENOL ) suppository 650 mg, 650 mg, Rectal, Q6H PRN, Patel, Vishal R, MD, 650 mg at 04/06/24 1839   acetaminophen  (TYLENOL ) tablet 325-650 mg, 325-650 mg, Oral, Q6H PRN, Cockerham, Alicia M, PA-C, 650 mg at 04/09/24 0804   albuterol  (PROVENTIL ) (2.5 MG/3ML) 0.083% nebulizer solution 3 mL, 3 mL, Nebulization, Q4H PRN, Renae, Alicia M, PA-C   Ampicillin -Sulbactam (UNASYN ) 3 g in sodium chloride  0.9 % 100 mL IVPB, 3 g, Intravenous, Q6H, Vu, Trung T, MD, Last Rate: 200 mL/hr  at 04/09/24 0808, 3 g at 04/09/24 0808   buPROPion  (WELLBUTRIN  XL) 24 hr tablet 150 mg, 150 mg, Oral, Daily, Cockerham, Alicia M, PA-C, 150 mg at 04/08/24 1021   carvedilol  (COREG ) tablet 6.25 mg, 6.25 mg, Oral, BID WC, Cockerham, Alicia M, PA-C, 6.25 mg at 04/09/24 9196   Chlorhexidine  Gluconate Cloth 2 % PADS 6 each, 6 each, Topical, Daily, Edna Toribio LABOR, MD, 6 each at 04/08/24 1021   DAPTOmycin  (CUBICIN ) IVPB 700 mg/100mL premix, 8 mg/kg (Adjusted), Intravenous, Q1400, Renae Bernarda HERO, PA-C, Last Rate: 200 mL/hr at 04/08/24 1513,  700 mg at 04/08/24 1513   diphenhydrAMINE  (BENADRYL ) 12.5 MG/5ML elixir 12.5-25 mg, 12.5-25 mg, Oral, Q4H PRN, Cockerham, Alicia M, PA-C   docusate sodium  (COLACE) capsule 100 mg, 100 mg, Oral, BID, Cockerham, Alicia M, PA-C, 100 mg at 04/08/24 2126   enoxaparin  (LOVENOX ) injection 40 mg, 40 mg, Subcutaneous, Q24H, Edna Toribio LABOR, MD, 40 mg at 04/08/24 1020   feeding supplement (ENSURE PLUS HIGH PROTEIN) liquid 237 mL, 237 mL, Oral, BID BM, Edna Toribio LABOR, MD   furosemide  (LASIX ) tablet 20 mg, 20 mg, Oral, Daily PRN, Cockerham, Alicia M, PA-C   hydrochlorothiazide  (HYDRODIURIL ) tablet 12.5 mg, 12.5 mg, Oral, Daily, 12.5 mg at 04/08/24 1018 **AND** losartan  (COZAAR ) tablet 100 mg, 100 mg, Oral, Daily, Wofford, Drew A, RPH, 100 mg at 04/08/24 1019   HYDROmorphone  (DILAUDID ) injection 0.5-1 mg, 0.5-1 mg, Intravenous, Q4H PRN, Cockerham, Alicia M, PA-C, 1 mg at 04/07/24 0745   lactated ringers  infusion, , Intravenous, Continuous, Renae Bernarda HERO, PA-C, Last Rate: 75 mL/hr at 04/08/24 2323, New Bag at 04/08/24 2323   lidocaine  (LIDODERM ) 5 % 1-3 patch, 1-3 patch, Transdermal, Daily PRN, Cockerham, Alicia M, PA-C, 2 patch at 04/08/24 2128   lisdexamfetamine (VYVANSE ) capsule 50 mg, 50 mg, Oral, Daily, Marchwiany, Daniel A, MD, 50 mg at 04/09/24 9196   loratadine  (CLARITIN ) tablet 10 mg, 10 mg, Oral, Daily PRN, Renae, Alicia M, PA-C   menthol -cetylpyridinium (CEPACOL) lozenge 3 mg, 1 lozenge, Oral, PRN **OR** phenol (CHLORASEPTIC) mouth spray 1 spray, 1 spray, Mouth/Throat, PRN, Cockerham, Alicia M, PA-C   methocarbamol  (ROBAXIN ) tablet 500 mg, 500 mg, Oral, Q6H PRN, 500 mg at 04/07/24 1005 **OR** methocarbamol  (ROBAXIN ) injection 500 mg, 500 mg, Intravenous, Q6H PRN, Cockerham, Alicia M, PA-C   naloxone  (NARCAN ) injection 0.4 mg, 0.4 mg, Intravenous, PRN, Edna Toribio LABOR, MD, 0.4 mg at 04/05/24 1229   ondansetron  (ZOFRAN ) tablet 4 mg, 4 mg, Oral, Q6H PRN **OR** ondansetron  (ZOFRAN )  injection 4 mg, 4 mg, Intravenous, Q6H PRN, Renae, Alicia M, PA-C   oxyCODONE  (Oxy IR/ROXICODONE ) immediate release tablet 5-10 mg, 5-10 mg, Oral, Q4H PRN, Edna Toribio LABOR, MD, 5 mg at 04/09/24 9096   pantoprazole  (PROTONIX ) EC tablet 40 mg, 40 mg, Oral, Daily, Cockerham, Alicia M, PA-C, 40 mg at 04/08/24 1018   polyethylene glycol (MIRALAX  / GLYCOLAX ) packet 17 g, 17 g, Oral, Daily PRN, Renae, Alicia M, PA-C   pregabalin  (LYRICA ) capsule 75 mg, 75 mg, Oral, TID, Cockerham, Alicia M, PA-C, 75 mg at 04/08/24 2125   rosuvastatin  (CRESTOR ) tablet 10 mg, 10 mg, Oral, Daily, Edna Toribio LABOR, MD, 10 mg at 04/08/24 1018   senna (SENOKOT) tablet 8.6 mg, 1 tablet, Oral, BID, Cockerham, Alicia M, PA-C, 8.6 mg at 04/08/24 2126   sodium chloride  flush (NS) 0.9 % injection 10-40 mL, 10-40 mL, Intracatheter, Q12H, Edna Toribio LABOR, MD, 10 mL at 04/07/24 1010   sodium chloride  flush (NS) 0.9 %  injection 10-40 mL, 10-40 mL, Intracatheter, PRN, Edna Toribio LABOR, MD   zolpidem  (AMBIEN ) tablet 5 mg, 5 mg, Oral, QHS PRN, Cockerham, Alicia M, PA-C  Allergies: Allergies  Allergen Reactions   Gadolinium Derivatives Nausea And Vomiting and Other (See Comments)    Pt describes this happens every time she gets gado even with slow injection    Codeine Itching, Nausea Only and Other (See Comments)    Tolerates oxycodone     Duloxetine     altered mental status    Porfirio LITTIE Glatter, DO

## 2024-04-09 NOTE — Plan of Care (Signed)

## 2024-04-09 NOTE — Consult Note (Signed)
 NEUROLOGY CONSULT NOTE   Date of service: April 09, 2024 Patient Name: Ashley [name] MRN:  [id number] DOB:  May 25, 1976 Chief Complaint: Encephalopathy Requesting Provider: Edna Toribio LABOR, MD  History of Present Illness  LILLIEMAE FRUGE is a 48 y.o. 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  The morning following surgery the patient was noted to be very lethargic, slow to respond, and not following commands.  Following her surgery she did receive oxycodone  10 mg but did not respond to Narcan . PDMP review shows consistent fill history for Vyvanse  50 mg daily and Percocet 10-325 mg every 6 hours along with recent fill for zolpidem  10 mg 15 tablets on 03/12/2024.  She has been continued on opiate pain medications both oral and IV.  Workup including CBC, BMP, blood gas, ammonia, chest x-ray, and CT head were largely unremarkable.  By 04/07/2024 after showing slow but steady improvement she was able to take pills, follows simple commands, and be more interactive.  Yesterday, 04/08/2024, she continued to improve although slowly and showed abnormal affect and inconsistent response times.  Family requested neurology evaluation since the patient continues to have abnormal mentation and has not returned to baseline after 4 days, although she is improving and today is the best she has been since the operation, per mother.   Patient seen in her bed this morning with her mom at bedside.  Patient reports that she does not feel like herself and her mom reports that she only appears about 30% back to normal.  On my exam the patient did not recall the correct year and did not know why she was in the hospital or that she had the surgery several days ago.  She reports just feeling off but did not get more specific  than this.    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   Complication of anesthesia    Depression    History of pneumonia    Hypertension    Infertility, female    Joint pain    Lumbar spondylosis    Neuromuscular disease (HCC)    numbness, paresthesias RLE, from failed lumbar back surgery   Osteoarthritis    PCOS (polycystic ovarian syndrome)    Pneumonia    2016   PONV (postoperative nausea and vomiting)    Pre-diabetes    Swelling of lower extremity     Past Surgical History:  Procedure Laterality Date   ABDOMINAL EXPOSURE N/A 02/17/2018   Procedure: ABDOMINAL EXPOSURE;  Surgeon: Oris Krystal FALCON, MD;  Location: [facility] OR;  Service: Vascular;  Laterality: N/A;   ANKLE SURGERY Bilateral    x 2   ANTERIOR LAT LUMBAR FUSION N/A 02/17/2018   Procedure: Lumbar three-four Lumbar four-five  Anterolateral lumbar interbody fusion;  Surgeon: Colon Shove, MD;  Location: [facility] OR;  Service: Neurosurgery;  Laterality: N/A;   ANTERIOR LUMBAR FUSION N/A 02/17/2018   Procedure: Lumbar five-Sacral one Anterior lumbar interbody fusion;  Surgeon: Colon Shove, MD;  Location: [facility] OR;  Service: Neurosurgery;  Laterality: N/A;   APPLICATION OF ROBOTIC ASSISTANCE FOR SPINAL PROCEDURE N/A 02/17/2018   Procedure: APPLICATION OF ROBOTIC ASSISTANCE FOR SPINAL PROCEDURE;  Surgeon: Colon Shove, MD;  Location: [facility] OR;  Service: Neurosurgery;  Laterality: N/A;   APPLICATION OF ROBOTIC ASSISTANCE FOR SPINAL PROCEDURE N/A 06/09/2018   Procedure: APPLICATION OF ROBOTIC ASSISTANCE FOR SPINAL PROCEDURE;  Surgeon: Colon Shove, MD;  Location: [facility];  Service: Neurosurgery;  Laterality: N/A;   BACK SURGERY     2017  discectomy   CARPAL TUNNEL RELEASE Left  07/04/2014   Procedure: LEFT CARPAL TUNNEL RELEASE;  Surgeon: Toribio JULIANNA Chancy, MD;  Location: [facility];  Service: Orthopedics;  Laterality: Left;   CARPAL TUNNEL RELEASE Right 08/08/2014   Procedure: RIGHT CARPAL TUNNEL RELEASE;  Surgeon: Toribio JULIANNA Chancy, MD;  Location: [facility];  Service: Orthopedics;  Laterality: Right;   COLONOSCOPY  2024   DILATION AND CURETTAGE OF UTERUS     DILATION AND EVACUATION  04/02/2011   Procedure: DILATATION AND EVACUATION (D&E);  Surgeon: Alm JAYSON Cook, MD;  Location: [facility];  Service: Gynecology;  Laterality: N/A;  dvt left mid thigh   DORSAL COMPARTMENT RELEASE Left 07/04/2014   Procedure: LEFT DEQUERVAINS;  Surgeon: Toribio JULIANNA Chancy, MD;  Location: [facility];  Service: Orthopedics;  Laterality: Left;   ENDOSCOPIC PLANTAR FASCIOTOMY Left 02/01/2002   HARDWARE REMOVAL Right 06/09/2018   Procedure: Repositioning of Right Lumbar three Right lumbar  four pedicle screw with MAZOR;  Surgeon: Colon Shove, MD;  Location: [facility] OR;  Service: Neurosurgery;  Laterality: Right;   HYSTEROSCOPY WITH D & C  07/17/2010   with exc. endometrial polyps   KNEE ARTHROSCOPY Right 03/07/2003; 01/14/2005; 08/31/2006   KNEE ARTHROSCOPY Left 03/21/2003; 11/26/2004   LUMBAR PERCUTANEOUS PEDICLE SCREW 3 LEVEL N/A 02/17/2018   Procedure: LUMBAR PERCUTANEOUS PEDICLE SCREW PLACEMENT LUMBAR THREE-SACRAL ONE;  Surgeon: Colon Shove, MD;  Location: [facility];  Service: Neurosurgery;  Laterality: N/A;   SHOULDER ARTHROSCOPY Right    TOE SURGERY Left 01/10/2003   claw toe correction 2nd, 3rd, 4th toes   TOTAL HIP ARTHROPLASTY Right 04/21/2016   Procedure: TOTAL HIP ARTHROPLASTY ANTERIOR APPROACH;  Surgeon: Toribio JULIANNA Chancy, MD;  Location: [facility] OR;  Service: Orthopedics;  Laterality: Right;   TOTAL HIP ARTHROPLASTY WITH HARDWARE REMOVAL Right 04/04/2024   Procedure: REVISION, ARTHROPLASTY, HIP;  Surgeon: Edna Toribio LABOR, MD;  Location: [facility];  Service:  Orthopedics;  Laterality: Right;   TOTAL KNEE ARTHROPLASTY Left 01/21/2010   TOTAL KNEE ARTHROPLASTY Right 08/26/2008   UPPER GI ENDOSCOPY N/A 04/14/2021   Procedure: UPPER GI ENDOSCOPY;  Surgeon: Gladis Cough, MD;  Location: [facility];  Service: General;  Laterality: N/A;    Family History: Family History  Problem Relation Age of Onset   Diabetes Father    Cancer Father        COLON   Heart disease Father    Kidney disease Father        dialysis   High blood pressure Father    Heart disease Sister    Diabetes Sister    Heart failure Sister    Heart failure Sister    Heart disease Sister    Cancer Maternal Grandmother        UTERINE   Cancer Paternal Grandmother        breast   Breast cancer Paternal Grandmother    Breast cancer Cousin     Social History  reports  that she has never smoked. She has never used smokeless tobacco. She reports that she does not drink alcohol  and does not use drugs.  Allergies  Allergen Reactions   Gadolinium Derivatives Nausea And Vomiting and Other (See Comments)    Pt describes this happens every time she gets gado even with slow injection    Codeine Itching, Nausea Only and Other (See Comments)    Tolerates oxycodone     Duloxetine     altered mental status    Medications   Current Facility-Administered Medications:    acetaminophen  (TYLENOL ) suppository 650 mg, 650 mg, Rectal, Q6H PRN, Patel, Vishal R, MD, 650 mg at 04/06/24 1839   acetaminophen  (TYLENOL ) tablet 325-650 mg, 325-650 mg, Oral, Q6H PRN, Cockerham, Alicia M, PA-C, 650 mg at 04/09/24 9195   albuterol  (PROVENTIL ) (2.5 MG/3ML) 0.083% nebulizer solution 3 mL, 3 mL, Nebulization, Q4H PRN, Cockerham, Alicia M, PA-C   Ampicillin -Sulbactam (UNASYN ) 3 g in sodium chloride  0.9 % 100 mL IVPB, 3 g, Intravenous, Q6H, Vu, Trung T, MD, Last Rate: 200 mL/hr at 04/09/24 0808, 3 g at 04/09/24 0808   buPROPion  (WELLBUTRIN  XL) 24 hr tablet 150 mg, 150 mg, Oral, Daily, Cockerham, Alicia M,  PA-C, 150 mg at 04/08/24 1021   carvedilol  (COREG ) tablet 6.25 mg, 6.25 mg, Oral, BID WC, Cockerham, Alicia M, PA-C, 6.25 mg at 04/09/24 0803   Chlorhexidine  Gluconate Cloth 2 % PADS 6 each, 6 each, Topical, Daily, Edna Toribio LABOR, MD, 6 each at 04/08/24 1021   DAPTOmycin  (CUBICIN ) IVPB 700 mg/100mL premix, 8 mg/kg (Adjusted), Intravenous, Q1400, Renae Hila M, PA-C, Last Rate: 200 mL/hr at 04/08/24 1513, 700 mg at 04/08/24 1513   diphenhydrAMINE  (BENADRYL ) 12.5 MG/5ML elixir 12.5-25 mg, 12.5-25 mg, Oral, Q4H PRN, Cockerham, Alicia M, PA-C   docusate sodium  (COLACE) capsule 100 mg, 100 mg, Oral, BID, Cockerham, Alicia M, PA-C, 100 mg at 04/08/24 2126   enoxaparin  (LOVENOX ) injection 40 mg, 40 mg, Subcutaneous, Q24H, Edna Toribio LABOR, MD, 40 mg at 04/08/24 1020   feeding supplement (ENSURE PLUS HIGH PROTEIN) liquid 237 mL, 237 mL, Oral, BID BM, Marchwiany, Toribio LABOR, MD   furosemide  (LASIX ) tablet 20 mg, 20 mg, Oral, Daily PRN, Cockerham, Alicia M, PA-C   hydrochlorothiazide  (HYDRODIURIL ) tablet 12.5 mg, 12.5 mg, Oral, Daily, 12.5 mg at 04/08/24 1018 **AND** losartan  (COZAAR ) tablet 100 mg, 100 mg, Oral, Daily, Wofford, Drew A, RPH, 100 mg at 04/08/24 1019   HYDROmorphone  (DILAUDID ) injection 0.5-1 mg, 0.5-1 mg, Intravenous, Q4H PRN, Cockerham, Alicia M, PA-C, 1 mg at 04/07/24 0745   lactated ringers  infusion, , Intravenous, Continuous, Renae Hila HERO, PA-C, Last Rate: 75 mL/hr at 04/08/24 2323, New Bag at 04/08/24 2323   lidocaine  (LIDODERM ) 5 % 1-3 patch, 1-3 patch, Transdermal, Daily PRN, Cockerham, Alicia M, PA-C, 2 patch at 04/08/24 2128   lisdexamfetamine (VYVANSE ) capsule 50 mg, 50 mg, Oral, Daily, Edna Toribio LABOR, MD, 50 mg at 04/09/24 9196   loratadine  (CLARITIN ) tablet 10 mg, 10 mg, Oral, Daily PRN, Renae, Alicia M, PA-C   menthol -cetylpyridinium (CEPACOL) lozenge 3 mg, 1 lozenge, Oral, PRN **OR** phenol (CHLORASEPTIC) mouth spray 1 spray, 1 spray, Mouth/Throat,  PRN, Cockerham, Alicia M, PA-C   methocarbamol  (ROBAXIN ) tablet 500 mg, 500 mg, Oral, Q6H PRN, 500 mg at 04/07/24 1005 **OR** methocarbamol  (ROBAXIN ) injection 500 mg, 500 mg, Intravenous, Q6H PRN, Cockerham, Alicia M, PA-C   naloxone  (NARCAN ) injection 0.4 mg, 0.4 mg, Intravenous, PRN, Edna Toribio LABOR, MD, 0.4 mg at 04/05/24 1229  ondansetron  (ZOFRAN ) tablet 4 mg, 4 mg, Oral, Q6H PRN **OR** ondansetron  (ZOFRAN ) injection 4 mg, 4 mg, Intravenous, Q6H PRN, Cockerham, Alicia M, PA-C   oxyCODONE  (Oxy IR/ROXICODONE ) immediate release tablet 5-10 mg, 5-10 mg, Oral, Q4H PRN, Edna Toribio LABOR, MD, 5 mg at 04/09/24 9096   pantoprazole  (PROTONIX ) EC tablet 40 mg, 40 mg, Oral, Daily, Cockerham, Alicia M, PA-C, 40 mg at 04/08/24 1018   polyethylene glycol (MIRALAX  / GLYCOLAX ) packet 17 g, 17 g, Oral, Daily PRN, Renae, Alicia M, PA-C   pregabalin  (LYRICA ) capsule 75 mg, 75 mg, Oral, TID, Cockerham, Alicia M, PA-C, 75 mg at 04/08/24 2125   rosuvastatin  (CRESTOR ) tablet 10 mg, 10 mg, Oral, Daily, Edna Toribio LABOR, MD, 10 mg at 04/08/24 1018   senna (SENOKOT) tablet 8.6 mg, 1 tablet, Oral, BID, Cockerham, Alicia M, PA-C, 8.6 mg at 04/08/24 2126   sodium chloride  flush (NS) 0.9 % injection 10-40 mL, 10-40 mL, Intracatheter, Q12H, Edna Toribio LABOR, MD, 10 mL at 04/07/24 1010   sodium chloride  flush (NS) 0.9 % injection 10-40 mL, 10-40 mL, Intracatheter, PRN, Edna Toribio LABOR, MD   zolpidem  (AMBIEN ) tablet 5 mg, 5 mg, Oral, QHS PRN, Renae Bernarda HERO, PA-C  Vitals   Vitals:   04/08/24 0522 04/08/24 2126 04/09/24 0552 04/09/24 0749  BP: 131/74 137/87 (!) 143/79 (!) 160/91  Pulse: 79 98 81 78  Resp: 16 16  16   Temp: 97.7 F (36.5 C) 99.3 F (37.4 C) 99.1 F (37.3 C) 98 F (36.7 C)  TempSrc:  Oral Oral Oral  SpO2: 100% 97% 99% 97%  Weight:      Height:        Body mass index is 35.98 kg/m.   Physical Exam    Physical Exam Gen: Alert & oriented to person, place, and  situation (said 2026 for the year and did not know why she came to the hospital but did know that she needed an operation on her hip), NAD HEENT: Atraumatic, normocephalic; oropharynx clear, tongue without atrophy or fasciculations. Resp: normal work of breathing CV: extremities appear well-perfused. Extrem: Nml bulk; no cyanosis, clubbing, or edema.  Neuro: *Ment: Alert & oriented to person, place, and situation (said 2026 for the year and did not know why she came to the hospital but did know that she needed an operation on her hip). Follows multi-step commands. Very slow to respond to most questions but quick to respond to a few.  Took 3-4 minutes to name in the months in order forwards. *Speech: no dysarthria or aphasia, able to name and repeat. *CN:    I: Deferred   II,III: PERRLA, VFF by confrontation, optic discs not visualized 2/2 pupillary constriction   III,IV,VI: EOMI w/o nystagmus, no ptosis   V: Sensation intact from V1 to V3 to LT   VII: Eyelid closure was full.  Smile symmetric.   VIII: Hearing intact to voice   IX,X: Voice normal, palate elevates symmetrically    XI: SCM/trap 5/5 bilat   XII: Tongue protrudes midline, no atrophy or fasciculations  *Motor:   Normal bulk.  No tremor, rigidity or bradykinesia. No pronator drift.   Strength: Dlt Bic Tri WE WrF FgS Gr HF KnF KnE PlF DoF    Left 5 5 5 5 5 5 5 5 5 5 5 5     Right 5 5 5 5 5 5 5  x x x 5 5   *Sensory: Intact to light touch, pinprick, temperature vibration throughout. Symmetric. Propioception intact  bilat.  No double-simultaneous extinction.  *Coordination:  Finger-to-nose and rapid alternating motions were slow and endpoints of finger-to-nose were highly variable being several inches away (in X/Y/Z axis) without tremor.  No cogwheel rigidity on bilateral upper extremities. *Reflexes:  2+ and symmetric throughout bilateral upper extremities and left lower extremity without clonus; right lower extremity deferred due to  significant pain toes down-going bilat *Gait: deferred   Labs/Imaging/Neurodiagnostic studies   CBC:  Recent Labs  Lab 04-30-24 0318 04/08/24 0325  WBC 10.2 7.5  HGB 9.2* 8.7*  HCT 29.3* 28.7*  MCV 82.5 84.2  PLT 103* 116*   Basic Metabolic Panel:  Lab Results  Component Value Date   NA 139 04/08/2024   K 3.5 04/08/2024   CO2 22 04/08/2024   GLUCOSE 89 04/08/2024   BUN 11 04/08/2024   CREATININE 0.69 04/08/2024   CALCIUM  7.8 (L) 04/08/2024   GFRNONAA >60 04/08/2024   GFRAA >60 11/24/2018   Lipid Panel:  Lab Results  Component Value Date   LDLCALC 106 (H) 10/21/2023   HgbA1c:  Lab Results  Component Value Date   HGBA1C 5.5 11/03/2023   Urine Drug Screen: No results found for: LABOPIA, COCAINSCRNUR, LABBENZ, AMPHETMU, THCU, LABBARB  Alcohol  Level No results found for: [facility] INR  Lab Results  Component Value Date   INR 0.95 09/14/2013   APTT  Lab Results  Component Value Date   APTT 24 09/14/2013     CT Head without contrast: Normal  ASSESSMENT  Ashley [name] is a 48 y.o. 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  The morning following surgery the patient was noted to be very lethargic, slow to respond, and not following commands.  Following her surgery she did receive oxycodone  10 mg, but did not respond to Narcan . PDMP review shows consistent fill history for Vyvanse  50 mg daily and Percocet 10-325 mg every 6 hours along with recent fill for zolpidem  10 mg 15 tablets on 03/12/2024.  She has been continued on opiate pain medications both oral and IV.  Workup including CBC, BMP, blood gas, ammonia, chest x-ray, and CT head were largely unremarkable.  By 04/30/24 after showing slow but steady improvement she was able to take pills, follows simple commands, and be more interactive.   Yesterday, 04/08/2024, she continued to improve although slowly and showed abnormal affect and inconsistent response times.  Family requested neurology evaluation since the patient continues to have abnormal mentation and has not returned to baseline after 4 days. - Continues to not be back at baseline today but day by day she has slowly improved.   - Impression: - Based on prior workup, history, and today's exam there is no apparent organic cause of her slowly improving encephalopathy, more specifically slow thinking/responding that is not entirely consistent throughout the visit and possible bradykinesia. However, again this is not always consistent with some movements being very quick and natural and some taking a lot longer.   - Overall not consistent with opiate withdrawal or overdose but could be due to the multimodal pain management.   - Continues on her home Wellbutrin  and Vyvanse  but these would not typically cause AMS characterized by bradyphrenia.   - This could be very slow recovery from anesthesia however less likely due to previously tolerating anesthesia very well. - Situational depression seems relatively likely based upon her overall  exam findings.  - Daptomycin  can in some cases be associated with altered mental status, which can manifest as confusion, hallucinations, or delirium. In rare cases, it can be associated with more severe reactions like encephalopathy or reversible posterior leukoencephalopathy syndrome (RPLS).   RECOMMENDATIONS  - Agree with no further neuroimaging at this point without focal deficits on exam other than inconsistent responses to questions and possible bradyphrenia - Agree with psychiatric evaluation - EEG (ordered) - Consider switching daptomycin  to an alternate antibiotic  ______________________________________________________________________    Signed, Fairy Pool, DO Internal Medicine Resident, PGY-3 10:07 AM 04/09/2024  I have seen and  examined the patient. I have formulated the assessment and recommendations. 48 year old female with gradually resolving post-operative altered mental status. Exam with some inconsistencies, but this does not rule out an organic or toxic etiology. Recommendations as above.  Electronically signed: Dr. Latia Mataya

## 2024-04-09 NOTE — Progress Notes (Signed)
 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 On exam patient appears to be following commands and appears she is having issue with sorethroat/conversation. Discuss with family current abx unlikely to be reason and reassurance given   ----------------- 04/06/24 id assessment Cx growing gpc but not yet identified Mentating still not baseline - hospital medicine consulted by ortho Febrile 100.5 last 24 hours Supplemental o2 stable 2 liters. Cxr perihilar opacity. ?aspiration in setting ams  Again no suspicion abx toxicity in regard to ams  04/09/24 id assessment Back to room air; mentating much better Operative cx gram stain gpc but not growing; to be kept for 2 weeks   Confirm with micro labs to hold samples for 2 weeks; spoke today   Plan: Continue daptomycin  Stop amp-sulb Opat as below Maintain standard isolation precaution Ok to discharge with ID follow up once cleared by surgery/primary team Consider post antibiotics sampling of joint prior to 2nd stage arthroplasty, no earlier than 2 weeks after she finish abx If crp/sed rate remains elevated near 6 weeks abx will transition to oral tail to  see if we can achieve improved labs/symptoms prior to consideration of repeat sampling/placement of new arthroplasty Discussed with primary team/hospital medicine      OPAT Orders Discharge antibiotics to be given via PICC line Discharge antibiotics: Daptomycin    Duration: 6 weeks  End Date: 05/17/24     [facility] Care Per Protocol:  Home health RN for IV administration and teaching; PICC line care and labs.    Labs weekly while on IV antibiotics: _x_ CBC with differential __ BMP _x_ CMP _x_ CRP _x_ ESR __ Vancomycin  trough _x_ CK  _x_ Please pull PIC at completion of IV antibiotics __ Please leave PIC in place until doctor has seen patient or been notified  Fax weekly labs to [phone]  Clinic Follow Up Appt: 8/19 @ 1030  @  [facility] [address], [place], [place] [address] Phone: [phone]      Principal Problem:   Failed total hip arthroplasty [facility]) Active Problems:   Essential hypertension   Depressive disorder   Chronic pain syndrome   Acute encephalopathy   Thrombocytopenia (HCC)   Normocytic anemia   Allergies  Allergen Reactions   Gadolinium Derivatives Nausea And Vomiting and Other (See Comments)    Pt describes this happens every time she gets gado even with slow injection    Codeine Itching, Nausea Only and Other (See Comments)  Tolerates oxycodone     Duloxetine     altered mental status    Scheduled Meds:  buPROPion   150 mg Oral Daily   carvedilol   6.25 mg Oral BID WC   Chlorhexidine  Gluconate Cloth  6 each Topical Daily   docusate sodium   100 mg Oral BID   enoxaparin   40 mg Subcutaneous Q24H   feeding supplement  237 mL Oral BID BM   hydrochlorothiazide   12.5 mg Oral Daily   And   losartan   100 mg Oral Daily   lisdexamfetamine  50 mg Oral Daily   pantoprazole   40 mg Oral Daily   pregabalin   75 mg Oral TID   rosuvastatin   10 mg Oral Daily   senna  1 tablet Oral BID   sodium chloride  flush  10-40 mL  Intracatheter Q12H   Continuous Infusions:  DAPTOmycin  700 mg (04/08/24 1513)   lactated ringers  75 mL/hr at 04/09/24 1045   PRN Meds:.acetaminophen , acetaminophen , albuterol , diphenhydrAMINE , furosemide , HYDROmorphone  (DILAUDID ) injection, lidocaine , loratadine , menthol -cetylpyridinium **OR** phenol, methocarbamol  **OR** methocarbamol  (ROBAXIN ) injection, naloxone , ondansetron  **OR** ondansetron  (ZOFRAN ) IV, oxyCODONE , polyethylene glycol, sodium chloride  flush, zolpidem    SUBJECTIVE: Afebrile On room air No complaint    Review of Systems: ROS All other ROS was negative, except mentioned above     OBJECTIVE: Vitals:   04/09/24 0552 04/09/24 0749 04/09/24 1027 04/09/24 1307  BP: (!) 143/79 (!) 160/91 (!) 155/77 (!) 152/90  Pulse: 81 78 97 92  Resp:  16  17  Temp: 99.1 F (37.3 C) 98 F (36.7 C)  98.8 F (37.1 C)  TempSrc: Oral Oral    SpO2: 99% 97%  99%  Weight:      Height:       Body mass index is 35.98 kg/m.  Physical Exam General/constitutional: no distress; nonverbal; tracking  HEENT: Normocephalic, PER, Conj Clear, EOMI, Oropharynx clear Neck supple CV: rrr no mrg Lungs: clear to auscultation, normal respiratory effort Abd: Soft, Nontender Ext: no edema Skin: No Rash Neuro: nonfocal MSK: right hip dressing c/d    Lab Results Lab Results  Component Value Date   WBC 7.5 04/08/2024   HGB 8.7 (L) 04/08/2024   HCT 28.7 (L) 04/08/2024   MCV 84.2 04/08/2024   PLT 116 (L) 04/08/2024    Lab Results  Component Value Date   CREATININE 0.69 04/08/2024   BUN 11 04/08/2024   NA 139 04/08/2024   K 3.5 04/08/2024   CL 107 04/08/2024   CO2 22 04/08/2024    Lab Results  Component Value Date   ALT 17 04/07/2024   AST 23 04/07/2024   ALKPHOS 46 04/07/2024   BILITOT 0.9 04/07/2024      Microbiology: Recent Results (from the past 240 hours)  Fungus Culture With Stain     Status: None (Preliminary result)   Collection Time: 04/04/24 12:38 PM    Specimen: Synovial, Right Hip; Body Fluid  Result Value Ref Range Status   Fungus Stain Final report  Final    Comment: (NOTE) Performed At: [facility] [address] [place], [place] [id number] Jennette Shorter MD Ey:[id number]    Fungus (Mycology) Culture PENDING  Incomplete   Fungal Source TISSUE  Final    Comment: RIGHT HIP 1A Performed at [facility], [address], [place], [place] [address]   Aerobic/Anaerobic Culture w Gram Stain (surgical/deep wound)     Status: None   Collection Time: 04/04/24 12:38 PM   Specimen: Synovial, Right Hip; Body Fluid  Result  Value Ref Range Status   Specimen Description   Final    TISSUE Performed at [facility], [address], [place], [place] [address]    Special Requests R HIP SAMPLE 1A  Final   Gram Stain NO WBC SEEN NO ORGANISMS SEEN   Final   Culture   Final    No growth aerobically or anaerobically. Performed at [facility], [address] [place], [place] [address]    Report Status 04/09/2024 FINAL  Final  Fungus Culture Result     Status: None   Collection Time: 04/04/24 12:38 PM  Result Value Ref Range Status   Result 1 Comment  Final    Comment: (NOTE) KOH/Calcofluor preparation:  no fungus observed. Performed At: [facility] [address] [place], [place] [id number] Jennette Shorter MD Ey:[id number]   Aerobic/Anaerobic Culture w Gram Stain (surgical/deep wound)     Status: None (Preliminary result)   Collection Time: 04/04/24 12:46 PM   Specimen: Synovial, Right Hip; Body Fluid  Result Value Ref Range Status   Specimen Description   Final    TISSUE Performed at [facility], [address], [place], [place] [address]    Special Requests R HIP SAMPLE B HOLD 2WKS  Final   Gram Stain NO WBC SEEN NO ORGANISMS SEEN   Final   Culture   Final    NO GROWTH 5 DAYS CONTINUING TO HOLD Performed at [facility], [address] [place],  [place] [address]    Report Status PENDING  Incomplete  Fungus Culture With Stain     Status: None   Collection Time: 04/04/24 12:49 PM   Specimen: Synovial, Right Hip; Body Fluid  Result Value Ref Range Status   Fungus Stain Final report  Final    Comment: (NOTE) Performed At: [facility] [address] [place], [place] [id number] Jennette Shorter MD Ey:[id number]    Fungus (Mycology) Culture PENDING  Incomplete   Fungal Source RT HIP SAMPLE C  Corrected    Comment: Performed at [facility], [address], [place], [place] [address] CORRECTED ON 07/16 AT 2115: PREVIOUSLY REPORTED AS TISSUE RIGHT HIP   Aerobic/Anaerobic Culture w Gram Stain (surgical/deep wound)     Status: None (Preliminary result)   Collection Time: 04/04/24 12:49 PM   Specimen: Synovial, Right Hip; Body Fluid  Result Value Ref Range Status   Specimen Description   Final    TISSUE Performed at [facility], [address], [place], [place] [address]    Special Requests   Final    R HIP Performed at [facility], [address] [place], [place] [address]    Gram Stain NO WBC SEEN RARE GRAM POSITIVE COCCI   Final   Culture   Final    NO GROWTH 5 DAYS CONTINUING TO HOLD Performed at [facility], [address], [place], [place] [address]    Report Status PENDING  Incomplete  Fungus Culture Result     Status: None   Collection Time: 04/04/24 12:49 PM  Result Value Ref Range Status   Result 1 Comment  Final    Comment: (NOTE) KOH/Calcofluor preparation:  no fungus observed. Performed At: [facility] [address] [place], [place] [id number] Jennette Shorter MD Ey:[id number]   Fungus Culture With Stain     Status: None (Preliminary result)   Collection Time: 04/04/24  1:31 PM   Specimen: Path Tissue  Result Value Ref Range Status   Fungus Stain Final report  Final    Comment: (NOTE) Performed At: [facility] [address]  [place], [place] [id number] Jennette Shorter MD Ey:[id number]    Fungus (Mycology) Culture PENDING  Incomplete   Fungal Source TISSUE  Final    Comment: RIGHT HIP Performed at [facility], [address], [place], [place] [address]   Aerobic/Anaerobic Culture w Gram Stain (surgical/deep wound)     Status: None (Preliminary result)   Collection Time: 04/04/24  1:31 PM   Specimen: Path Tissue  Result Value Ref Range Status   Specimen Description   Final    TISSUE Performed at [facility], [address] [place], [place] [address]    Special Requests R HIP SAMPLE D HOLD 14 DAYSQ  Final   Gram Stain   Final    FEW WBC PRESENT,BOTH PMN AND MONONUCLEAR NO ORGANISMS SEEN    Culture   Final    NO GROWTH 5 DAYS CONTINUING TO HOLD Performed at [facility], [address] [place], [place] [address]    Report Status PENDING  Incomplete  Fungus Culture Result     Status: None   Collection Time: 04/04/24  1:31 PM  Result Value Ref Range Status   Result 1 Comment  Final    Comment: (NOTE) KOH/Calcofluor preparation:  no fungus observed. Performed At: [facility] [address] [place], [place] [id number] Jennette Shorter MD Ey:[id number]      Serology:   Imaging: If present, new imagings (plain films, ct scans, and mri) have been personally visualized and interpreted; radiology reports have been reviewed. Decision making incorporated into the Impression / Recommendations.  7/17 ct head  Normal    7/16 xray right hip FINDINGS:   BONES AND JOINTS: Postoperative change of the right hip. No acute fracture or dislocation. The procedure performed on the right hip was an explantation and debridement of the right hip with implantation of antibiotic articulating hip spacer.   SOFT TISSUES: Tubal ligation clips in the pelvis.   IMPRESSION: 1. Postoperative changes in the right hip.   Constance ONEIDA Passer, MD [facility] [phone] pager    04/09/2024, 1:10 PM

## 2024-04-09 NOTE — Progress Notes (Signed)
 Inpatient Rehab Admissions Coordinator:   Per therapy recommendations, patient was screened for CIR candidacy by Leita Kleine, MS, CCC-SLP. At this time, Pt. does not appear to demonstrate medical necessity to justify in hospital rehabilitation/CIR and cannot tolerate the intensity of CIR. I will not pursue a rehab consult for this Pt.   Recommend other rehab venues to be pursued.  Please contact me with any questions.  Leita Kleine, MS, CCC-SLP Rehab Admissions Coordinator  [phone] (celll) [phone] (office)

## 2024-04-09 NOTE — Plan of Care (Signed)
   Problem: Coping: Goal: Level of anxiety will decrease Outcome: Progressing   Problem: Pain Managment: Goal: General experience of comfort will improve and/or be controlled Outcome: Progressing   Problem: Safety: Goal: Ability to remain free from injury will improve Outcome: Progressing

## 2024-04-09 NOTE — Progress Notes (Addendum)
     Subjective:  Patient remains lethargic but participating more and becoming more like herself according to her family reports that she was fairly normal during the day yesterday.  Still very minimal mobility.  Only was set up at the side of the bed now independently.  Hopeful for standing transfers today.  No concerns denies distal numbness and tingling.  Reportedly oversedation with the Oxy 10 will reduce her to oxy 5 mg.  Objective:   VITALS:   Vitals:   04/08/24 0522 04/08/24 2126 04/09/24 0552 04/09/24 0749  BP: 131/74 137/87 (!) 143/79 (!) 160/91  Pulse: 79 98 81 78  Resp: 16 16  16   Temp: 97.7 F (36.5 C) 99.3 F (37.4 C) 99.1 F (37.3 C) 98 F (36.7 C)  TempSrc:  Oral Oral Oral  SpO2: 100% 97% 99% 97%  Weight:      Height:        Sensation intact distally Intact pulses distally Dorsiflexion/Plantar flexion intact Incision: dressing C/D/I Compartment soft    Lab Results  Component Value Date   WBC 7.5 04/08/2024   HGB 8.7 (L) 04/08/2024   HCT 28.7 (L) 04/08/2024   MCV 84.2 04/08/2024   PLT 116 (L) 04/08/2024   BMET    Component Value Date/Time   NA 139 04/08/2024 0325   NA 139 10/21/2023 1054   K 3.5 04/08/2024 0325   CL 107 04/08/2024 0325   CO2 22 04/08/2024 0325   GLUCOSE 89 04/08/2024 0325   BUN 11 04/08/2024 0325   BUN 8 10/21/2023 1054   CREATININE 0.69 04/08/2024 0325   CALCIUM  7.8 (L) 04/08/2024 0325   CALCIUM  8.3 (L) 08/28/2010 0525   EGFR 91 10/21/2023 1054   GFRNONAA >60 04/08/2024 0325    Xray: Antibiotic spacer components in good position no adverse features  Assessment/Plan: 5 Days Post-Op   Principal Problem:   Failed total hip arthroplasty (HCC) Active Problems:   Essential hypertension   Depressive disorder   Chronic pain syndrome   Acute encephalopathy   Thrombocytopenia (HCC)   Normocytic anemia  Status post right explant antibiotic spacer placement for chronic prosthetic joint infection  Post op recs: WB: 20%  weightbearing right lower extremity, posterior hip precautions Abx: Currently advised broad-spectrum antibiotics with daptomycin  and unasyn  per infectious disease.  Recs are appreciated.  IntraOp cultures with 1 out of 4 showing rare gram-positive cocci we will continue to follow cultures for growth. imaging: PACU pelvis Xray Dressing: dermabond and aquacell dressing DVT prophylaxis: Eliquis  2.5 mg postop day 1 Follow up: 1 week after surgery for a wound check with Dr. Edna at [facility].  Address: [address], [place], [place] [address]  Office Phone: [phone]   TORIBIO DELENA EDNA 04/09/2024, 9:06 AM   TORIBIO Edna, MD  Contact information:   [phone] 7am-5pm epic message Dr. Edna, or call office for patient follow up: [phone] After hours and holidays please check [email or web address] for group call information for [facility]

## 2024-04-09 NOTE — Progress Notes (Signed)
  Progress Note   Patient: Ashley [name] FMW:[id number] DOB: [birth year] DOA: 04/04/2024     5 DOS: the patient was seen and examined on 04/09/2024   Brief hospital course: 48 year old woman PMH sleeve gastrectomy, multiple joint surgeries, chronic pain syndrome, admitted for semielective right THA explant for chronic prosthetic joint infection.  Status post surgery 7/16.  7/17 patient developed lethargy and hospital service was consulted for further evaluation.  Primary service: Orthopedics Admit 7/16 for surgery  Procedures/Events 7/16 Right hip stage I revision explantation and debridement of right hip with implantation of antibiotic articulating hip spacer   Assessment and Plan: Lethargy Acute encephalopathy Workup unrevealing including CT head negative, serum ammonia within normal limits, BMP unremarkable. CXR left perihilar infiltrate but no clinical signs or symptoms of infection. Continues to improve, today more awake and alert and interactive.  Speech more appropriate but still delayed at times.  Suspect multifactorial including postanesthesia as well as nonorganic etiology.  Do not think any further imaging would be useful. Family has requested neurology consultation.  I have discussed also with orthopedics who has requested psychiatry consultation, I concur. Expect to continue spontaneously improved.   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 Continue antibiotics per ID   Thrombocytopenia Normocytic anemia Thrombocytopenia perioperative in nature, stable Hemoglobin stable   Hypertension: BP is stable.  Continue Coreg , HCTZ, losartan     Chronic pain syndrome: Home regimen includes Percocet 10-325 q6h prn, fill consistently per PDMP.  Has not been taking oral pain meds due to mentation and will be at risk for withdrawal.    Depression/anxiety: Continue Wellbutrin .   Appears to be improving.  Await  neurology and psychiatry input.  Suspect medically stable for discharge.     Subjective:  Feels better Mother thinks patient has improved significantly  Physical Exam: Vitals:   04/08/24 0522 04/08/24 2126 04/09/24 0552 04/09/24 0749  BP: 131/74 137/87 (!) 143/79 (!) 160/91  Pulse: 79 98 81 78  Resp: 16 16  16   Temp: 97.7 F (36.5 C) 99.3 F (37.4 C) 99.1 F (37.3 C) 98 F (36.7 C)  TempSrc:  Oral Oral Oral  SpO2: 100% 97% 99% 97%  Weight:      Height:       Physical Exam Vitals reviewed.  Constitutional:      General: She is not in acute distress.    Appearance: She is not ill-appearing or toxic-appearing.  Cardiovascular:     Rate and Rhythm: Normal rate and regular rhythm.     Heart sounds: No murmur heard. Pulmonary:     Effort: Pulmonary effort is normal. No respiratory distress.     Breath sounds: No wheezing, rhonchi or rales.  Neurological:     Mental Status: She is alert.  Psychiatric:        Mood and Affect: Mood normal.        Behavior: Behavior normal.     Data Reviewed: No new data  Family Communication: mother at bedside  Disposition: Status is: Inpatient     Time spent: 20 minutes  Author: Toribio Door, MD 04/09/2024 8:13 AM  For on call review [email or web address].

## 2024-04-09 NOTE — Progress Notes (Signed)
 Physical Therapy Treatment Patient Details Name: Ashley [name] MRN: [id number] DOB: October 03, 1975 Today's Date: 04/09/2024   History of Present Illness Ashley [name] is a 48 y.o. F admitted 04/04/2024 for a planned explant of right hip hardware with antibiotic spacer placement for chronic prosthetic joint infection. PMH:  HTN, depression/anxiety, s/p sleeve gastrectomy, s/p right THA 2017, chronic pain syndrome    PT Comments  POD # 5 Pt in bed with Mom, Daughter and Granddaughter at bedside.  Family stated, she was able to feed herself today initially but then needed assist to finish as she dozes off. Difficult to arouse, Pt was able to tell me the names of all fer family in room but slow, delayed response and difficulty keeping her eyes open.  Oxy was decreased from 10mg  to 5mg .  Still appears drunk, Impaired.  Attempted to sit EOB was unsuccessful.  General bed mobility comments: attempted supine to sit was unsuccessful due to lethargy.  Pt only 5% initiated.  Eyes shut most of session.  delayed reaction response.  Unable to complete EOB.  Pt moaning.  Lethargic. Pt unable to move her good LE and moaned in pain.   Attempted to perform AAROM TE's however Pt offered no assist moaning. Per chart review, Pt unable to tolerate CIR Therapy.  Will update LPT. Hopefully as her antibiotics kick in, she will be more alert.   Rehab Team to continue to follow and attempt to see tomorrow.    If plan is discharge home, recommend the following: Two people to help with walking and/or transfers;Two people to help with bathing/dressing/bathroom;Assistance with cooking/housework;Assist for transportation;Help with stairs or ramp for entrance   Can travel by private vehicle     No  Equipment Recommendations  Rolling walker (2 wheels)    Recommendations for Other Services       Precautions / Restrictions Precautions Precautions: Posterior Hip Recall of Precautions/Restrictions:  Impaired Restrictions Weight Bearing Restrictions Per Provider Order: Yes RLE Weight Bearing Per Provider Order: Partial weight bearing RLE Partial Weight Bearing Percentage or Pounds: 20% Other Position/Activity Restrictions: posterior hip     Mobility  Bed Mobility Overal bed mobility: Needs Assistance Bed Mobility: Supine to Sit     Supine to sit: HOB elevated, Used rails, Total assist, +2 for physical assistance     General bed mobility comments: attempted supine to sit was unsuccessful due to lethargy.  Pt only 5% initiated.  Eyes shut most of session.  delayed reaction response.  Unable to complete EOB.  Pt moaning.  Lethargic.    Transfers                   General transfer comment: unable to attempt    Ambulation/Gait                   Stairs             Wheelchair Mobility     Tilt Bed    Modified Rankin (Stroke Patients Only)       Balance                                            Communication Communication Communication: Impaired  Cognition Arousal: Lethargic Behavior During Therapy: Flat affect   PT - Cognitive impairments: Difficult to assess  PT - Cognition Comments: Min verbalization from pt.  Delayed/absent response.  groggy.  Unccordinated.  Difficult to sustain alertness. Following commands: Impaired      Cueing Cueing Techniques: Verbal cues, Gestural cues  Exercises      General Comments        Pertinent Vitals/Pain Pain Assessment Pain Assessment: Faces Faces Pain Scale: Hurts even more Pain Location: bil LEs with movement - R>L Pain Descriptors / Indicators: Crying, Grimacing, Guarding, Moaning Pain Intervention(s): Monitored during session, Repositioned, Premedicated before session, Ice applied    Home Living                          Prior Function            PT Goals (current goals can now be found in the care plan section) Progress  towards PT goals: Progressing toward goals    Frequency    7X/week      PT Plan      Co-evaluation              AM-PAC PT 6 Clicks Mobility   Outcome Measure  Help needed turning from your back to your side while in a flat bed without using bedrails?: Total Help needed moving from lying on your back to sitting on the side of a flat bed without using bedrails?: Total Help needed moving to and from a bed to a chair (including a wheelchair)?: Total Help needed standing up from a chair using your arms (e.g., wheelchair or bedside chair)?: Total Help needed to walk in hospital room?: Total Help needed climbing 3-5 steps with a railing? : Total 6 Click Score: 6    End of Session Equipment Utilized During Treatment: Gait belt Activity Tolerance: Other (comment) (lethargic/groggy) Patient left: in bed;with call bell/phone within reach;with family/visitor present;with bed alarm set Nurse Communication: Mobility status;Need for lift equipment PT Visit Diagnosis: Difficulty in walking, not elsewhere classified (R26.2);Pain;Muscle weakness (generalized) (M62.81) Pain - Right/Left: Right Pain - part of body: Hip     Time: 8551-8494 PT Time Calculation (min) (ACUTE ONLY): 17 min  Charges:    $Therapeutic Activity: 8-22 mins PT General Charges $$ ACUTE PT VISIT: 1 Visit                     Katheryn Leap  PTA Acute  Rehabilitation Services Office M-F          [phone]

## 2024-04-10 ENCOUNTER — Inpatient Hospital Stay (HOSPITAL_COMMUNITY)
Admission: RE | Admit: 2024-04-10 | Discharge: 2024-04-10 | Disposition: A | Discharge status: Home / Self Care | Source: Home / Self Care | Attending: Neurology | Admitting: Neurology

## 2024-04-10 DIAGNOSIS — R569 Unspecified convulsions: Secondary | ICD-10-CM | POA: Diagnosis not present

## 2024-04-10 DIAGNOSIS — R4182 Altered mental status, unspecified: Secondary | ICD-10-CM

## 2024-04-10 DIAGNOSIS — R410 Disorientation, unspecified: Secondary | ICD-10-CM | POA: Insufficient documentation

## 2024-04-10 DIAGNOSIS — D696 Thrombocytopenia, unspecified: Secondary | ICD-10-CM | POA: Diagnosis not present

## 2024-04-10 DIAGNOSIS — D649 Anemia, unspecified: Secondary | ICD-10-CM | POA: Diagnosis not present

## 2024-04-10 DIAGNOSIS — G9340 Encephalopathy, unspecified: Secondary | ICD-10-CM | POA: Diagnosis not present

## 2024-04-10 MED ORDER — SODIUM CHLORIDE 0.9 % IV SOLN
700.0000 mg | Freq: Every day | INTRAVENOUS | Status: DC
  Administered 2024-04-10: 700 mg via INTRAVENOUS
  Filled 2024-04-10 (×2): qty 14

## 2024-04-10 NOTE — Progress Notes (Signed)
  Progress Note   Patient: Ashley [name] FMW:[id number] DOB: 1975/10/14 DOA: 04/04/2024     6 DOS: the patient was seen and examined on 04/10/2024   Brief hospital course: 48 year old woman PMH sleeve gastrectomy, multiple joint surgeries, chronic pain syndrome, admitted for semielective right THA explant for chronic prosthetic joint infection.  Status post surgery 7/16.  7/17 patient developed lethargy and hospital service was consulted for further evaluation.  Primary service: Orthopedics Admit 7/16 for surgery  Procedures/Events 7/16 Right hip stage I revision explantation and debridement of right hip with implantation of antibiotic articulating hip spacer   Assessment and Plan: Lethargy Acute encephalopathy Postoperative delirium Workup unrevealing including CT head negative, serum ammonia within normal limits, BMP unremarkable.  EEG normal. CXR left perihilar infiltrate but no clinical signs or symptoms of infection. Continues to slowly improve, alert and interactive.  Responses remain variable.  Appreciate neurology consultation.  Her symptoms for seen postsurgically and I doubt are related to antibiotics specifically daptomycin  Appreciate psychiatry consultation Patient continues to improve, anticipate spontaneous resolution extensive workup unrevealing, seen by neurology and psychiatry.   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 Continue antibiotics per ID   Thrombocytopenia Normocytic anemia Thrombocytopenia perioperative in nature, stable Hemoglobin stable   Hypertension: BP is stable.  Continue Coreg , HCTZ, losartan     Chronic pain syndrome: Home regimen includes Percocet 10-325 q6h prn, fill consistently per PDMP.  Has not been taking oral pain meds due to mentation and will be at risk for withdrawal.    Depression/anxiety: Continue Wellbutrin .       Subjective:  Feels better Eating  some  Physical Exam: Vitals:   04/09/24 1623 04/09/24 2317 04/10/24 0510 04/10/24 1428  BP: (!) 144/85 (!) 149/87 (!) 139/93 121/81  Pulse: 91 84 84 89  Resp:  16 18 18   Temp:  98.1 F (36.7 C) 98 F (36.7 C) 98 F (36.7 C)  TempSrc:      SpO2:  98% 99% 100%  Weight:      Height:       Physical Exam Vitals reviewed.  Constitutional:      General: She is not in acute distress.    Appearance: She is not ill-appearing or toxic-appearing.  Cardiovascular:     Rate and Rhythm: Normal rate and regular rhythm.     Heart sounds: No murmur heard. Pulmonary:     Effort: Pulmonary effort is normal. No respiratory distress.     Breath sounds: No wheezing, rhonchi or rales.  Neurological:     Mental Status: She is alert.     Cranial Nerves: No cranial nerve deficit.  Psychiatric:        Attention and Perception: She is inattentive.        Mood and Affect: Affect is flat.        Speech: Speech normal.        Behavior: Behavior is slowed.        Thought Content: Thought content normal.     Comments: Much more alert and talkative today though still terse     Data Reviewed: EEG normal Family Communication: sister at bedside  Disposition: Status is: Inpatient     Time spent: 20 minutes  Author: Toribio Door, MD 04/10/2024 5:09 PM  For on call review [email or web address].

## 2024-04-10 NOTE — Progress Notes (Addendum)
 Physical Therapy Treatment Patient Details Name: ITZELLE GAINS MRN: [id number] DOB: 09/17/1976 Today's Date: 04/10/2024   History of Present Illness KALILAH BARUA is a 48 y.o. F admitted 04/04/2024 for a planned explant of right hip hardware with antibiotic spacer placement for chronic prosthetic joint infection. PMH:  HTN, depression/anxiety, s/p sleeve gastrectomy, s/p right THA 2017, chronic pain syndrome    PT Comments  POD # 6 Cognition Comments: improving.  presents with selective alertness and inconsistant responses.  Pt puts forth no effort and shows minimal engagement with Therapist.  Have noticed, she will engage with family and visitors. Pre medicated for session.  Still exhibits 10/10 pain with grimacing and moaning.  Assisted OOB to recliner was very difficult.  General bed mobility comments: Required increased time and repeat VC's to attempt Pt to initiate just moving b LE off bed.  Pt required Total Assist to move other LE to EOB.  Pt grimacing with pain before Therapist even touches her.  Assisted from supine required + 2 Total Assist helicopter/swival using bed pad Pt offering 0% and even pushing the opposite direction/resistant.  Once EOB/upright Pt presented with Max LEFT lean and holding B LE off floor.  Increased time to attempt a midline/upright posture but unable to fully achieve midline. General transfer comment: assisted from elevated bed to drop arm recliner via sliding board required + 3 Total Assist as Pt offered 0% self effort.  Pt actually pushing in the opposite direction.  Maxi Move Pad placed in recliner.  Rec LIFT back to bed. Per chart review, Pt is not approved for CIR will most likely need SNF.    If plan is discharge home, recommend the following: Two people to help with walking and/or transfers;Two people to help with bathing/dressing/bathroom;Assistance with cooking/housework;Assist for transportation;Help with stairs or ramp for entrance   Can travel  by private vehicle     No  Equipment Recommendations       Recommendations for Other Services       Precautions / Restrictions Precautions Precautions: Posterior Hip Recall of Precautions/Restrictions: Impaired Restrictions Weight Bearing Restrictions Per Provider Order: Yes RLE Weight Bearing Per Provider Order: Partial weight bearing RLE Partial Weight Bearing Percentage or Pounds: 20 lbs Other Position/Activity Restrictions: posterior hip     Mobility  Bed Mobility Overal bed mobility: Needs Assistance Bed Mobility: Supine to Sit     Supine to sit: HOB elevated, Used rails, Total assist, +2 for physical assistance     General bed mobility comments: Required increased time and repeat VC's to attempt Pt to initiate just moving b LE off bed.  Pt required Total Assist to move other LE to EOB.  Pt grimacing with pain before Therapist even touches her.  Assisted from supine required + 2 Total Assist helicopter/swival using bed pad Pt offering 0% and even pushing the opposite direction/resistant.  Once EOB/upright Pt presented with Max LEFT lean and holding B LE off floor.  Increased time to attempt a midline/upright posture but unable to fully achieve midline.    Transfers Overall transfer level: Needs assistance Equipment used: Sliding board Transfers: Bed to chair/wheelchair/BSC            Lateral/Scoot Transfers: Total assist, +2 physical assistance, +2 safety/equipment, With slide board, From elevated surface General transfer comment: assisted from elevated bed to drop arm recliner via sliding board required + 3 Total Assist as Pt offered 0% self effort.  Pt actually pushing in the opposite direction.  Maxi Move Pad  placed in recliner.  Rec LIFT back to bed.    Ambulation/Gait               General Gait Details: non amb   Stairs             Wheelchair Mobility     Tilt Bed    Modified Rankin (Stroke Patients Only)       Balance                                             Communication    Cognition Arousal: Alert, Lethargic Behavior During Therapy:  (selective, inconsistant and self limiting)                           PT - Cognition Comments: improving.  presents with selective alertness and inconsistant responses.  Pt puts forth no effort and shows minimal engagement with Therapist.  Have noticed, she will engage with family and visitors. Following commands: Impaired Following commands impaired: Follows one step commands inconsistently, Follows one step commands with increased time    Cueing Cueing Techniques: Verbal cues, Gestural cues  Exercises      General Comments        Pertinent Vitals/Pain Pain Assessment Pain Assessment: 0-10 Pain Score: 10-Worst pain ever Pain Location: R hip Pain Descriptors / Indicators: Crying, Grimacing, Guarding, Moaning Pain Intervention(s): Limited activity within patient's tolerance    Home Living                          Prior Function            PT Goals (current goals can now be found in the care plan section) Progress towards PT goals: Progressing toward goals    Frequency    7X/week      PT Plan      Co-evaluation              AM-PAC PT 6 Clicks Mobility   Outcome Measure  Help needed turning from your back to your side while in a flat bed without using bedrails?: Total Help needed moving from lying on your back to sitting on the side of a flat bed without using bedrails?: Total Help needed moving to and from a bed to a chair (including a wheelchair)?: Total Help needed standing up from a chair using your arms (e.g., wheelchair or bedside chair)?: Total Help needed to walk in hospital room?: Total Help needed climbing 3-5 steps with a railing? : Total 6 Click Score: 6    End of Session Equipment Utilized During Treatment: Gait belt Activity Tolerance: Other (comment) (Pt self limiting) Patient left:  in chair;with call bell/phone within reach Nurse Communication: Mobility status;Need for lift equipment PT Visit Diagnosis: Difficulty in walking, not elsewhere classified (R26.2);Pain;Muscle weakness (generalized) (M62.81) Pain - Right/Left: Right Pain - part of body: Hip     Time: 1200-1233 PT Time Calculation (min) (ACUTE ONLY): 33 min  Charges:      PT General Charges $$ ACUTE PT VISIT: 1 Visit                     Katheryn Leap  PTA Acute  Rehabilitation Services Office M-F          [phone]

## 2024-04-10 NOTE — Procedures (Signed)
 Patient Name: Ashley [name]  MRN: [id number]  Epilepsy Attending: Arlin MALVA Krebs  Referring Physician/Provider: Merrianne Locus, MD  Date: 04/10/2024  Duration: 22.13 mins  Patient history: 48yo female with ams. EEG to evaluate for seizure.  Level of alertness: Awake  AEDs during EEG study: None  Technical aspects: This EEG study was done with scalp electrodes positioned according to the 10-20 International system of electrode placement. Electrical activity was reviewed with band pass filter of 1-70Hz , sensitivity of 7 uV/mm, display speed of 63mm/sec with a 60Hz  notched filter applied as appropriate. EEG data were recorded continuously and digitally stored.  Video monitoring was available and reviewed as appropriate.  Description: The posterior dominant rhythm consists of 9-10 Hz activity of moderate voltage (25-35 uV) seen predominantly in posterior head regions, symmetric and reactive to eye opening and eye closing. Hyperventilation and photic stimulation were not performed.     IMPRESSION: This study is within normal limits. No seizures or epileptiform discharges were seen throughout the recording.  A normal interictal EEG does not exclude  the diagnosis of epilepsy.   Jenee Spaugh O Nayara Taplin

## 2024-04-10 NOTE — Progress Notes (Signed)
 Occupational Therapy Treatment Patient Details Name: Ashley [name] MRN: [id number] DOB: 30-Nov-1975 Today's Date: 04/10/2024   History of present illness Ashley [name] is a 48 yr old female admitted 04/04/2024 for a planned explant of right hip hardware with antibiotic spacer placement for chronic prosthetic joint infection. PMH:  HTN, depression/anxiety, s/p sleeve gastrectomy, s/p right THA 2017, chronic pain syndrome   OT comments  The pt was seen for ADL instruction with regards to self-feeding and grooming, while she was seated in the chair. Three to four family members were present at various times during the session. She was noted to be with absent verbalizations throughout the session, and she appeared lethargic. She did intermittently moan, withdraw, and grimace, when her BLE were lowered at the start of the session, then elevated at the end of the session. With regards to feeding and upper body grooming, she required max hand-over-hand assist for scooping food and bringing it to her mouth on 5 instances. Her mother stated the pt likes baby food, specifically baby food bananas, therefore this is what she consumed. She further required cues to open her eyes and keep them open, as well as to initiate tasks and for thoroughness with face washing. She mostly required PROM to intermittent AAROM for BUE and BLE in sitting. She was noted to grimace and moan with ROM to her R knee and ankle. Continue OT plan of care. Patient will benefit from continued inpatient follow up therapy, <3 hours/day.       If plan is discharge home, recommend the following:  Two people to help with walking and/or transfers;A lot of help with bathing/dressing/bathroom;Assistance with cooking/housework;Direct supervision/assist for medications management;Assist for transportation;Help with stairs or ramp for entrance;Assistance with feeding   Equipment Recommendations  Other (comment) (defer to next level of care)     Recommendations for Other Services      Precautions / Restrictions Precautions Precautions: Posterior Hip Precaution Booklet Issued: Yes (comment) Recall of Precautions/Restrictions: Impaired Restrictions Weight Bearing Restrictions Per Provider Order: Yes RLE Weight Bearing Per Provider Order: Partial weight bearing Other Position/Activity Restrictions: posterior hip        ADL either performed or assessed with clinical judgement   ADL Overall ADL's : Needs assistance/impaired Eating/Feeding: Maximal assistance;Sitting;Cueing for sequencing Eating/Feeding Details (indicate cue type and reason): The pt's mother was present and stated the pt like baby food, specifically bananas. As such, OT instructed the pt on self-feeding in this regard.The pt required max hand-over-hand assist for scooping food and bringing it to her mouth on 5 instances.  She further required cues to open her eyes and keep them open, as well as to initiate feeding. She drank water  from a cup, requiring max hand-over-hand assist in this regard, as well. Grooming: Maximal assistance;Cueing for sequencing;Cueing for compensatory techniques;Sitting Grooming Details (indicate cue type and reason): The pt was instructed on performing face washing in sitting at chair level. She required max hand-over-hand assist, as well as increased cues for initiation, thoroughness with tasks and to open and keep her eyes open throughout.                                     Communication Communication Factors Affecting Communication: Other (comment) (absent vocalizations during session)   Cognition Arousal: Lethargic Behavior During Therapy: Flat affect Cognition: Difficult to assess Difficult to assess due to: Level of arousal  Executive functioning impairment (select all impairments): Initiation, Organization, Sequencing, Reasoning, Problem solving      Following commands: Impaired Following commands  impaired: Follows multi-step commands inconsistently      Cueing   Cueing Techniques: Verbal cues, Gestural cues, Tactile cues             Pertinent Vitals/ Pain       Pain Assessment Pain Assessment: Faces Pain Score: 7  Pain Location: R hip Pain Descriptors / Indicators: Crying, Grimacing, Guarding, Moaning Pain Intervention(s): Monitored during session, Limited activity within patient's tolerance, Repositioned   Frequency  Min 2X/week        Progress Toward Goals  OT Goals(current goals can now be found in the care plan section)     Acute Rehab OT Goals Patient Stated Goal: she did not state, as she was without verbalizations during the session OT Goal Formulation: With patient/family Time For Goal Achievement: 04/21/24 Potential to Achieve Goals: Fair  Plan         AM-PAC OT 6 Clicks Daily Activity     Outcome Measure   Help from another person eating meals?: A Lot Help from another person taking care of personal grooming?: A Lot Help from another person toileting, which includes using toliet, bedpan, or urinal?: Total Help from another person bathing (including washing, rinsing, drying)?: A Lot Help from another person to put on and taking off regular upper body clothing?: A Lot Help from another person to put on and taking off regular lower body clothing?: Total 6 Click Score: 10    End of Session Equipment Utilized During Treatment: Other (comment) (N/A)  OT Visit Diagnosis: Unsteadiness on feet (R26.81);Other abnormalities of gait and mobility (R26.89);Muscle weakness (generalized) (M62.81);Other symptoms and signs involving cognitive function;Pain;Feeding difficulties (R63.3) Pain - Right/Left: Right Pain - part of body: Hip   Activity Tolerance Patient limited by lethargy   Patient Left in chair;with call bell/phone within reach;with family/visitor present   Nurse Communication Other (comment) (nurse informed of pt's mom requesting for the pt to  be assisted back to bed)        Time: 1701-1720 OT Time Calculation (min): 19 min  Charges: OT General Charges $OT Visit: 1 Visit OT Treatments $Self Care/Home Management : 8-22 mins      Ashley [name],OTR/L 04/10/2024, 6:00 PM

## 2024-04-10 NOTE — Progress Notes (Signed)
     Subjective: Patient remains lethargic but getting better. Seems 30% back to normal per family. Still has not been able to mobilize with PT. Psych and neuro evals yesteray. Recs appreciated. EEG ordered.  Objective:   VITALS:   Vitals:   04/09/24 1307 04/09/24 1623 04/09/24 2317 04/10/24 0510  BP: (!) 152/90 (!) 144/85 (!) 149/87 (!) 139/93  Pulse: 92 91 84 84  Resp: 17  16 18   Temp: 98.8 F (37.1 C)  98.1 F (36.7 C) 98 F (36.7 C)  TempSrc:      SpO2: 99%  98% 99%  Weight:      Height:        Sensation intact distally Intact pulses distally Dorsiflexion/Plantar flexion intact Incision: dressing C/D/I Compartment soft    Lab Results  Component Value Date   WBC 7.5 04/08/2024   HGB 8.7 (L) 04/08/2024   HCT 28.7 (L) 04/08/2024   MCV 84.2 04/08/2024   PLT 116 (L) 04/08/2024   BMET    Component Value Date/Time   NA 139 04/08/2024 0325   NA 139 10/21/2023 1054   K 3.5 04/08/2024 0325   CL 107 04/08/2024 0325   CO2 22 04/08/2024 0325   GLUCOSE 89 04/08/2024 0325   BUN 11 04/08/2024 0325   BUN 8 10/21/2023 1054   CREATININE 0.69 04/08/2024 0325   CALCIUM  7.8 (L) 04/08/2024 0325   CALCIUM  8.3 (L) 08/28/2010 0525   EGFR 91 10/21/2023 1054   GFRNONAA >60 04/08/2024 0325    Xray: Antibiotic spacer components in good position no adverse features  Assessment/Plan: 6 Days Post-Op   Principal Problem:   Failed total hip arthroplasty (HCC) Active Problems:   Essential hypertension   Depressive disorder   Chronic pain syndrome   Acute encephalopathy   Thrombocytopenia (HCC)   Normocytic anemia  Status post right explant antibiotic spacer placement for chronic prosthetic joint infection  Post op recs: WB: 20% weightbearing right lower extremity, posterior hip precautions Abx: Currently advised broad-spectrum antibiotics with daptomycin  and unasyn  per infectious disease.  Recs are appreciated.  IntraOp cultures with 1 out of 4 showing rare gram-positive  cocci we will continue to follow cultures for growth. imaging: PACU pelvis Xray Dressing: dermabond and aquacell dressing DVT prophylaxis: Eliquis  2.5 mg postop day 1 Follow up: 1 week after surgery for a wound check with Dr. Edna at [facility].  Address: [address], [place], [place] [address]  Office Phone: [phone]   TORIBIO DELENA EDNA 04/10/2024, 7:02 AM   TORIBIO Edna, MD  Contact information:   [phone] 7am-5pm epic message Dr. Edna, or call office for patient follow up: [phone] After hours and holidays please check [email or web address] for group call information for [facility]

## 2024-04-10 NOTE — Progress Notes (Signed)
 Patient alert, able to tell me her name and birthday, unable to tell me where she is, the current date, and why she is here, she is able to converse with her son, Jerona, who is in the room, using 1-3 word statements, she also asked appropriate questions about her son's friends that he is currently playing video games with in the room (she knew their names), denies pain unless right hip is touched or right leg is moved even the slightest bit, also seems to have generalized pain all over when moving any of her extremities, has been drinking more water  but still not eating much solid food, is working on an Ensure drink at this time, urine in purewick is now yellow and clear (was amber earlier), Aquacel dressing CDI, ice packs placed, moving toes, denies numbness, toes warm, IV infusing well through right PICC, left peripheral flushed and patent, took pills without difficulty, son at bedside and call bell in reach.

## 2024-04-10 NOTE — Plan of Care (Signed)
  Problem: Coping: Goal: Level of anxiety will decrease Outcome: Progressing   Problem: Nutrition: Goal: Adequate nutrition will be maintained Outcome: Progressing   Problem: Pain Managment: Goal: General experience of comfort will improve and/or be controlled Outcome: Progressing   Problem: Safety: Goal: Ability to remain free from injury will improve Outcome: Progressing

## 2024-04-10 NOTE — Progress Notes (Signed)
 EEG complete - results pending

## 2024-04-11 DIAGNOSIS — R410 Disorientation, unspecified: Secondary | ICD-10-CM

## 2024-04-11 DIAGNOSIS — I10 Essential (primary) hypertension: Secondary | ICD-10-CM

## 2024-04-11 DIAGNOSIS — G9340 Encephalopathy, unspecified: Secondary | ICD-10-CM | POA: Diagnosis not present

## 2024-04-11 DIAGNOSIS — G894 Chronic pain syndrome: Secondary | ICD-10-CM | POA: Diagnosis not present

## 2024-04-11 DIAGNOSIS — T84018D Broken internal joint prosthesis, other site, subsequent encounter: Secondary | ICD-10-CM | POA: Diagnosis not present

## 2024-04-11 DIAGNOSIS — F32A Depression, unspecified: Secondary | ICD-10-CM

## 2024-04-11 LAB — CBC
HCT: 30.4 % — ABNORMAL LOW (ref 36.0–46.0)
Hemoglobin: 9.6 g/dL — ABNORMAL LOW (ref 12.0–15.0)
MCH: 25.9 pg — ABNORMAL LOW (ref 26.0–34.0)
MCHC: 31.6 g/dL (ref 30.0–36.0)
MCV: 81.9 fL (ref 80.0–100.0)
Platelets: 259 K/uL (ref 150–400)
RBC: 3.71 MIL/uL — ABNORMAL LOW (ref 3.87–5.11)
RDW: 15.9 % — ABNORMAL HIGH (ref 11.5–15.5)
WBC: 9.5 K/uL (ref 4.0–10.5)
nRBC: 0 % (ref 0.0–0.2)

## 2024-04-11 LAB — COMPREHENSIVE METABOLIC PANEL WITH GFR
ALT: 21 U/L (ref 0–44)
AST: 27 U/L (ref 15–41)
Albumin: 2.5 g/dL — ABNORMAL LOW (ref 3.5–5.0)
Alkaline Phosphatase: 58 U/L (ref 38–126)
Anion gap: 8 (ref 5–15)
BUN: 9 mg/dL (ref 6–20)
CO2: 26 mmol/L (ref 22–32)
Calcium: 8.3 mg/dL — ABNORMAL LOW (ref 8.9–10.3)
Chloride: 104 mmol/L (ref 98–111)
Creatinine, Ser: 0.75 mg/dL (ref 0.44–1.00)
GFR, Estimated: >60 mL/min (ref >60)
Glucose, Bld: 152 mg/dL — ABNORMAL HIGH (ref 70–99)
Potassium: 3.4 mmol/L — ABNORMAL LOW (ref 3.5–5.1)
Sodium: 138 mmol/L (ref 135–145)
Total Bilirubin: 0.9 mg/dL (ref 0.0–1.2)
Total Protein: 6.6 g/dL (ref 6.5–8.1)

## 2024-04-11 MED ORDER — SODIUM CHLORIDE 0.9 % IV SOLN: INTRAVENOUS | Status: AC | PRN

## 2024-04-11 MED ORDER — DAPTOMYCIN-SODIUM CHLORIDE 700-0.9 MG/100ML-% IV SOLN
700.0000 mg | Freq: Every day | INTRAVENOUS | Status: DC
  Administered 2024-04-11 – 2024-04-18 (×8): 700 mg via INTRAVENOUS
  Filled 2024-04-11 (×9): qty 100

## 2024-04-11 NOTE — Progress Notes (Signed)
     Subjective: Patient remains lethargic but getting better. EEG yesterday was normal. She did mobilize to recliner with a lot of help from PT. Son at bedside. Concerned that IV dilaudid  is causing oversedation so will DC for now.  Objective:   VITALS:   Vitals:   04/10/24 0510 04/10/24 1428 04/10/24 2136 04/11/24 0528  BP: (!) 139/93 121/81 (!) 141/80 (!) 152/92  Pulse: 84 89 93 88  Resp: 18 18 16 17   Temp: 98 F (36.7 C) 98 F (36.7 C) 98.5 F (36.9 C) 98.2 F (36.8 C)  TempSrc:   Oral Oral  SpO2: 99% 100% 100% 100%  Weight:      Height:        Sensation intact distally Intact pulses distally Dorsiflexion/Plantar flexion intact Incision: dressing C/D/I Compartment soft    Lab Results  Component Value Date   WBC 7.5 04/08/2024   HGB 8.7 (L) 04/08/2024   HCT 28.7 (L) 04/08/2024   MCV 84.2 04/08/2024   PLT 116 (L) 04/08/2024   BMET    Component Value Date/Time   NA 139 04/08/2024 0325   NA 139 10/21/2023 1054   K 3.5 04/08/2024 0325   CL 107 04/08/2024 0325   CO2 22 04/08/2024 0325   GLUCOSE 89 04/08/2024 0325   BUN 11 04/08/2024 0325   BUN 8 10/21/2023 1054   CREATININE 0.69 04/08/2024 0325   CALCIUM  7.8 (L) 04/08/2024 0325   CALCIUM  8.3 (L) 08/28/2010 0525   EGFR 91 10/21/2023 1054   GFRNONAA >60 04/08/2024 0325    Xray: Antibiotic spacer components in good position no adverse features  Assessment/Plan: 7 Days Post-Op   Principal Problem:   Failed total hip arthroplasty (HCC) Active Problems:   Essential hypertension   Depressive disorder   Chronic pain syndrome   Acute encephalopathy   Thrombocytopenia (HCC)   Normocytic anemia   Delirium  Status post right explant antibiotic spacer placement for chronic prosthetic joint infection  Post op recs: WB: 20% weightbearing right lower extremity, posterior hip precautions Abx: Currently advised broad-spectrum antibiotics with daptomycin  and unasyn  per infectious disease.  Recs are  appreciated.  IntraOp cultures with 1 out of 4 showing rare gram-positive cocci we will continue to follow cultures for growth. imaging: PACU pelvis Xray Dressing: dermabond and aquacell dressing DVT prophylaxis: Eliquis  2.5 mg postop day 1 Follow up: 1 week after surgery for a wound check with Dr. Edna at [facility].  Address: [address], [place], [place] [address]  Office Phone: [phone]   TORIBIO DELENA EDNA 04/11/2024, 7:05 AM   TORIBIO Edna, MD  Contact information:   [phone] 7am-5pm epic message Dr. Edna, or call office for patient follow up: [phone] After hours and holidays please check [email or web address] for group call information for [facility]

## 2024-04-11 NOTE — Progress Notes (Signed)
 PROGRESS NOTE    Ashley [name]  FMW:[id number] DOB: 05/17/1976 DOA: 04/04/2024 PCP: Wonda Worth SQUIBB, PA   Brief Narrative:  48 year old woman PMH sleeve gastrectomy, multiple joint surgeries, chronic pain syndrome, admitted for semielective right THA explant for chronic prosthetic joint infection.  Status post surgery 7/16.  7/17 patient developed lethargy and hospital service was consulted for further evaluation.  Assessment & Plan:   Principal Problem:   Failed total hip arthroplasty (HCC) Active Problems:   Essential hypertension   Depressive disorder   Chronic pain syndrome   Acute encephalopathy   Thrombocytopenia (HCC)   Normocytic anemia   Delirium  Lethargy Acute encephalopathy Postoperative delirium, resolving Workup thus far negative for any metabolic etiology  Imaging negative for acute infection Appreciate neurology and psychiatry consultation.  Her symptoms for seen postsurgically and are unlikely related to antibiotics Patient continues to improve, anticipate ongoing improvement   Chronic PJI s/p right explant and antibiotic spacer 04/04/2024: Management per orthopedics and infectious disease. Continue antibiotics per ID   Thrombocytopenia Normocytic anemia Thrombocytopenia perioperative in nature, stable Hemoglobin stable   Hypertension: BP is stable.  Continue Coreg , HCTZ, losartan     Chronic pain syndrome: Home regimen includes Percocet 10-325 q6h prn, fill consistently per PDMP.  Has not been taking oral pain meds due to mentation and will be at risk for withdrawal.    Depression/anxiety: Continue Wellbutrin .   DVT prophylaxis: enoxaparin  (LOVENOX ) injection 40 mg Start: 04/07/24 1000 SCDs Start: 04/04/24 1726 Place TED hose Start: 04/04/24 1726 Code Status:   Code Status: Full Code Family Communication: Mother and son at bedside  Status is: Inpatient  Dispo: The patient is from: Home              Anticipated d/c is to: To be determined               Anticipated d/c date is: 24 to 48 hours              Patient currently not medically stable for discharge  Consultants:  Ortho primary, neurology also consulted along with hospitalist team  Procedures:  right hip explant and antibiotic spacer on 04/04/2024   Antimicrobials:  daptomycin    Subjective: No acute issues/events overnight  Objective: Vitals:   04/10/24 0510 04/10/24 1428 04/10/24 2136 04/11/24 0528  BP: (!) 139/93 121/81 (!) 141/80 (!) 152/92  Pulse: 84 89 93 88  Resp: 18 18 16 17   Temp: 98 F (36.7 C) 98 F (36.7 C) 98.5 F (36.9 C) 98.2 F (36.8 C)  TempSrc:   Oral Oral  SpO2: 99% 100% 100% 100%  Weight:      Height:        Intake/Output Summary (Last 24 hours) at 04/11/2024 0825 Last data filed at 04/11/2024 0600 Gross per 24 hour  Intake 424 ml  Output 850 ml  Net -426 ml   Filed Weights   04/04/24 0928  Weight: 117 kg    Examination:  General:  Pleasantly resting in bed, No acute distress. HEENT:  Normocephalic atraumatic.  Sclerae nonicteric, noninjected.  Extraocular movements intact bilaterally. Neck:  Without mass or deformity.  Trachea is midline. Lungs:  Clear to auscultate bilaterally without rhonchi, wheeze, or rales. Heart:  Regular rate and rhythm.  Without murmurs, rubs, or gallops. Abdomen:  Soft, nontender, nondistended.  Without guarding or rebound. Extremities: Without cyanosis, clubbing, edema, or obvious deformity. Skin:  Warm and dry, no erythema.  Data Reviewed: I have personally reviewed following labs and  imaging studies  CBC: Recent Labs  Lab 04/05/24 0532 04/05/24 1400 04/06/24 0319 04/07/24 0318 04/08/24 0325  WBC 13.1* 12.4* 10.6* 10.2 7.5  HGB 10.5* 10.9* 9.9* 9.2* 8.7*  HCT 33.5* 35.6* 31.1* 29.3* 28.7*  MCV 82.9 84.0 82.5 82.5 84.2  PLT 124* 112* 112* 103* 116*   Basic Metabolic Panel: Recent Labs  Lab 04/05/24 0532 04/05/24 1924 04/06/24 0319 04/07/24 0318 04/08/24 0325  NA 136 138 139  137 139  K 3.5 3.6 3.5 3.4* 3.5  CL 103 105 106 105 107  CO2 25 24 23 22 22   GLUCOSE 146* 123* 120* 104* 89  BUN 14 13 12 10 11   CREATININE 0.99 1.02* 0.61 0.68 0.69  CALCIUM  7.9* 7.9* 7.8* 7.8* 7.8*  MG  --  1.8  --  1.8  --   PHOS  --   --   --  2.9  --    GFR: Estimated Creatinine Clearance: 119.9 mL/min (by C-G formula based on SCr of 0.69 mg/dL).  Liver Function Tests: Recent Labs  Lab 04/05/24 1924 04/07/24 0318  AST 38 23  ALT 21 17  ALKPHOS 52 46  BILITOT 0.7 0.9  PROT 6.0* 5.7*  ALBUMIN 2.6* 2.5*   Recent Labs  Lab 04/05/24 1951  AMMONIA 25   Cardiac Enzymes: Recent Labs  Lab 04/05/24 0532 04/07/24 0318  CKTOTAL 824* 484*   CBG: Recent Labs  Lab 04/05/24 1755  GLUCAP 122*    Recent Results (from the past 240 hours)  Fungus Culture With Stain     Status: None (Preliminary result)   Collection Time: 04/04/24 12:38 PM   Specimen: Synovial, Right Hip; Body Fluid  Result Value Ref Range Status   Fungus Stain Final report  Final    Comment: (NOTE) Performed At: [facility] [address] [place], [place] [id number] Jennette Shorter MD Ey:[id number]    Fungus (Mycology) Culture PENDING  Incomplete   Fungal Source TISSUE  Final    Comment: RIGHT HIP 1A Performed at [facility], [address], [place], [place] [address]   Aerobic/Anaerobic Culture w Gram Stain (surgical/deep wound)     Status: None   Collection Time: 04/04/24 12:38 PM   Specimen: Synovial, Right Hip; Body Fluid  Result Value Ref Range Status   Specimen Description   Final    TISSUE Performed at [facility], [address] [place], [place] [address]    Special Requests R HIP SAMPLE 1A  Final   Gram Stain NO WBC SEEN NO ORGANISMS SEEN   Final   Culture   Final    No growth aerobically or anaerobically. Performed at [facility], [address], [place], [place] [address]    Report Status 04/09/2024 FINAL  Final  Fungus Culture  Result     Status: None   Collection Time: 04/04/24 12:38 PM  Result Value Ref Range Status   Result 1 Comment  Final    Comment: (NOTE) KOH/Calcofluor preparation:  no fungus observed. Performed At: [facility] [address] [place], [place] [id number] Jennette Shorter MD Ey:[id number]   Aerobic/Anaerobic Culture w Gram Stain (surgical/deep wound)     Status: None (Preliminary result)   Collection Time: 04/04/24 12:46 PM   Specimen: Synovial, Right Hip; Body Fluid  Result Value Ref Range Status   Specimen Description   Final    TISSUE Performed at [facility], [address], [place], [place] [address]    Special  Requests R HIP SAMPLE B HOLD 2WKS  Final   Gram Stain NO WBC SEEN NO ORGANISMS SEEN   Final   Culture   Final    NO GROWTH 6 DAYS CONTINUING TO HOLD Performed at [facility], [address], [place], [place] [address]    Report Status PENDING  Incomplete  Fungus Culture With Stain     Status: None   Collection Time: 04/04/24 12:49 PM   Specimen: Synovial, Right Hip; Body Fluid  Result Value Ref Range Status   Fungus Stain Final report  Final    Comment: (NOTE) Performed At: [facility] [address] [place], [place] [id number] Jennette Shorter MD Ey:[id number]    Fungus (Mycology) Culture PENDING  Incomplete   Fungal Source RT HIP SAMPLE C  Corrected    Comment: Performed at [facility], [address], [place], [place] [address] CORRECTED ON 07/16 AT 2115: PREVIOUSLY REPORTED AS TISSUE RIGHT HIP   Aerobic/Anaerobic Culture w Gram Stain (surgical/deep wound)     Status: None (Preliminary result)   Collection Time: 04/04/24 12:49 PM   Specimen: Synovial, Right Hip; Body Fluid  Result Value Ref Range Status   Specimen Description   Final    TISSUE Performed at [facility], [address] [place], [place] [address]    Special Requests   Final    R HIP Performed at [facility], [address], [place], [place] [address]    Gram Stain NO WBC SEEN RARE GRAM POSITIVE COCCI   Final   Culture   Final    NO GROWTH 6 DAYS CONTINUING TO HOLD Performed at [facility], [address], [place], [place] [address]    Report Status PENDING  Incomplete  Fungus Culture Result     Status: None   Collection Time: 04/04/24 12:49 PM  Result Value Ref Range Status   Result 1 Comment  Final    Comment: (NOTE) KOH/Calcofluor preparation:  no fungus observed. Performed At: [facility] [address] [place], [place] [id number] Jennette Shorter MD Ey:[id number]   Fungus Culture With Stain     Status: None (Preliminary result)   Collection Time: 04/04/24  1:31 PM   Specimen: Path Tissue  Result Value Ref Range Status   Fungus Stain Final report  Final    Comment: (NOTE) Performed At: [facility] [address] [place], [place] [id number] Jennette Shorter MD Ey:[id number]    Fungus (Mycology) Culture PENDING  Incomplete   Fungal Source TISSUE  Final    Comment: RIGHT HIP Performed at [facility], [address], [place], [place] [address]   Aerobic/Anaerobic Culture w Gram Stain (surgical/deep wound)     Status: None (Preliminary result)   Collection Time: 04/04/24  1:31 PM   Specimen: Path Tissue  Result Value Ref Range Status   Specimen Description   Final    TISSUE Performed at [facility], [address], [place], [place] [address]    Special Requests R HIP SAMPLE D HOLD 14 DAYSQ  Final   Gram Stain   Final    FEW WBC PRESENT,BOTH PMN AND MONONUCLEAR NO ORGANISMS SEEN    Culture   Final    NO GROWTH 6 DAYS CONTINUING TO HOLD Performed at [facility], [address], [place], [place] [address]    Report Status PENDING  Incomplete  Fungus Culture Result     Status: None  Collection Time: 04/04/24  1:31 PM  Result Value Ref Range Status   Result 1 Comment  Final    Comment:  (NOTE) KOH/Calcofluor preparation:  no fungus observed. Performed At: [facility] [address] [place], [place] [id number] Jennette Shorter MD Ey:[id number]          Radiology Studies: EEG adult Result Date: 04/10/2024 Shelton Arlin KIDD, MD     04/10/2024  4:28 PM Patient Name: REAGANN DOLCE MRN: [id number] Epilepsy Attending: Arlin KIDD Shelton Referring Physician/Provider: Merrianne Locus, MD Date: 04/10/2024 Duration: 22.13 mins Patient history: 48yo female with ams. EEG to evaluate for seizure. Level of alertness: Awake AEDs during EEG study: None Technical aspects: This EEG study was done with scalp electrodes positioned according to the 10-20 International system of electrode placement. Electrical activity was reviewed with band pass filter of 1-70Hz , sensitivity of 7 uV/mm, display speed of 11mm/sec with a 60Hz  notched filter applied as appropriate. EEG data were recorded continuously and digitally stored.  Video monitoring was available and reviewed as appropriate. Description: The posterior dominant rhythm consists of 9-10 Hz activity of moderate voltage (25-35 uV) seen predominantly in posterior head regions, symmetric and reactive to eye opening and eye closing. Hyperventilation and photic stimulation were not performed.   IMPRESSION: This study is within normal limits. No seizures or epileptiform discharges were seen throughout the recording. A normal interictal EEG does not exclude  the diagnosis of epilepsy. Priyanka O Yadav        Scheduled Meds:  buPROPion   150 mg Oral Daily   carvedilol   6.25 mg Oral BID WC   Chlorhexidine  Gluconate Cloth  6 each Topical Daily   docusate sodium   100 mg Oral BID   enoxaparin   40 mg Subcutaneous Q24H   feeding supplement  237 mL Oral BID BM   hydrochlorothiazide   12.5 mg Oral Daily   And   losartan   100 mg Oral Daily   lisdexamfetamine  50 mg Oral Daily   pantoprazole   40 mg Oral Daily   rosuvastatin   10 mg Oral Daily   senna  1  tablet Oral BID   sodium chloride  flush  10-40 mL Intracatheter Q12H   Continuous Infusions:  DAPTOmycin        LOS: 7 days   Time spent:  Elsie JAYSON [place], DO [facility]  If 7PM-7AM, please contact night-coverage [email or web address]  04/11/2024, 8:25 AM

## 2024-04-11 NOTE — Progress Notes (Addendum)
 PT Cancellation Note  Patient Details Name: Ashley [name] MRN: [id number] DOB: 09/22/1975   Cancelled Treatment:     Pt on/off BED PAN several times and extended time with care from NT in am.  Do to scheduling, I was unable to return in pm.  Will see tomorrow.  Katheryn Leap  PTA Acute  Rehabilitation Services Office M-F          [phone]

## 2024-04-12 DIAGNOSIS — T84018D Broken internal joint prosthesis, other site, subsequent encounter: Secondary | ICD-10-CM | POA: Diagnosis not present

## 2024-04-12 DIAGNOSIS — G894 Chronic pain syndrome: Secondary | ICD-10-CM | POA: Diagnosis not present

## 2024-04-12 DIAGNOSIS — G9340 Encephalopathy, unspecified: Secondary | ICD-10-CM | POA: Diagnosis not present

## 2024-04-12 DIAGNOSIS — R410 Disorientation, unspecified: Secondary | ICD-10-CM | POA: Diagnosis not present

## 2024-04-12 LAB — CK: Total CK: 110 U/L (ref 38–234)

## 2024-04-12 NOTE — Plan of Care (Signed)
   Problem: Coping: Goal: Level of anxiety will decrease Outcome: Progressing   Problem: Pain Managment: Goal: General experience of comfort will improve and/or be controlled Outcome: Progressing   Problem: Safety: Goal: Ability to remain free from injury will improve Outcome: Progressing

## 2024-04-12 NOTE — NC FL2 (Addendum)
 [place]  MEDICAID FL2 LEVEL OF CARE FORM     IDENTIFICATION  Patient Name: Ashley [name] Birthdate: 1976/04/25 Sex: female Admission Date (Current Location): 04/04/2024  [facility] [place] and [place] Number:  Producer, television/film/video and Address:  [facility],  [address] [place], [place] [address]      Provider Number: [id number]  Attending Physician Name and Address:  Edna Toribio LABOR, MD  Relative Name and Phone Number:  mother, Leartis Child @ [phone]    Current Level of Care: Hospital Recommended Level of Care: Skilled Nursing Facility Prior Approval Number:    Date Approved/Denied:   PASRR Number: pending  Discharge Plan: SNF    Current Diagnoses: 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   Bilateral leg edema 01/09/2024   Chronic pain syndrome 01/09/2024   Hyperlipidemia 12/22/2023   Seasonal allergies 12/22/2023   Vitamin D  deficiency 11/21/2023   Prediabetes 11/03/2023   Allergic rhinitis 01/29/2022   Allergic rhinitis due to animal hair and dander 01/29/2022   Allergic rhinitis due to pollen 01/29/2022   Chronic allergic conjunctivitis 01/29/2022   Seafood allergy 01/29/2022   History of endometrial ablation 07/24/2021   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   PCOS (polycystic ovarian syndrome) 04/02/2021   Depressive disorder 04/02/2021   Generalized OA 04/02/2021   Postoperative pain 04/02/2021   Acute blood loss anemia 04/02/2021   Neuropathic pain 04/02/2021   Therapeutic opioid induced constipation 04/02/2021   Urinary retention 04/02/2021   Fall 04/02/2021   Labile blood pressure 04/02/2021   Pain 04/02/2021   Generalized anxiety disorder 04/02/2021   Obesity 04/02/2021   Body mass index (BMI) 40.0-44.9, adult (HCC) 12/31/2020   Flat foot 05/07/2019    Abnormality of gait 02/22/2019   Herpes simplex 01/18/2019   Lumbosacral radiculopathy at L4 06/09/2018   Paresis of single lower extremity (HCC) 04/26/2018   Lumbar radiculopathy 02/21/2018   Status post surgery 02/17/2018   Hip pain 01/05/2018   Radiculopathy, lumbar region 08/10/2017   Lumbar spondylosis 08/10/2017   Herniated nucleus pulposus, lumbar 05/19/2016   Primary localized osteoarthritis of right hip 04/21/2016   Sinusitis 06/25/2015   Other specified cough 06/25/2015   Essential hypertension 04/03/2015   Dehydration 04/03/2015   CAP (community acquired pneumonia) 03/31/2015   Right lower lobe pneumonia 03/31/2015   Fever 03/31/2015   Pregnancy 04/29/2014   Miscarriage    Incomplete miscarriage 04/02/2011   Deep venous thrombosis (HCC) 09/20/2010    Orientation RESPIRATION BLADDER Height & Weight     Self, Place  Normal Incontinent, External catheter (currently with purewick) Weight: 258 lb (117 kg) Height:  5' 11 (180.3 cm)  BEHAVIORAL SYMPTOMS/MOOD NEUROLOGICAL BOWEL NUTRITION STATUS      Incontinent Diet (regular)  AMBULATORY STATUS COMMUNICATION OF NEEDS Skin   Total Care Verbally Other (Comment) (surgical incision only)                       Personal Care Assistance Level of Assistance  Bathing, Feeding, Dressing Bathing Assistance: Maximum assistance Feeding assistance: Limited assistance Dressing Assistance: Maximum assistance     Functional Limitations Info  Sight, Hearing, Speech Sight Info: Adequate Hearing Info: Adequate Speech Info: Adequate    SPECIAL CARE FACTORS FREQUENCY  PT (By licensed PT), OT (By licensed OT)     PT Frequency:  5x/wk OT Frequency: 5x/wk            Contractures Contractures Info: Not present    Additional Factors Info  Code Status, Allergies, Psychotropic Code Status Info: full Allergies Info: Gadolinium Derivatives, Codeine, Duloxetine Psychotropic Info: see MAR         Current Medications  (04/12/2024):  This is the current hospital active medication list Current Facility-Administered Medications  Medication Dose Route Frequency Provider Last Rate Last Admin   0.9 %  sodium chloride  infusion   Intravenous PRN Edna Toribio LABOR, MD 10 mL/hr at 04/11/24 1512 Infusion Verify at 04/11/24 1512   acetaminophen  (TYLENOL ) suppository 650 mg  650 mg Rectal Q6H PRN Patel, Vishal R, MD   650 mg at 04/06/24 1839   acetaminophen  (TYLENOL ) tablet 325-650 mg  325-650 mg Oral Q6H PRN Cockerham, Alicia M, PA-C   650 mg at 04/12/24 0630   albuterol  (PROVENTIL ) (2.5 MG/3ML) 0.083% nebulizer solution 3 mL  3 mL Nebulization Q4H PRN Cockerham, Alicia M, PA-C       buPROPion  (WELLBUTRIN  XL) 24 hr tablet 150 mg  150 mg Oral Daily Cockerham, Alicia M, PA-C   150 mg at 04/12/24 9041   carvedilol  (COREG ) tablet 6.25 mg  6.25 mg Oral BID WC Cockerham, Alicia M, PA-C   6.25 mg at 04/12/24 9041   Chlorhexidine  Gluconate Cloth 2 % PADS 6 each  6 each Topical Daily Edna Toribio LABOR, MD   6 each at 04/12/24 1006   DAPTOmycin  (CUBICIN ) IVPB 700 mg/100mL premix  700 mg Intravenous Q1400 Vu, Trung T, MD 200 mL/hr at 04/12/24 1333 700 mg at 04/12/24 1333   docusate sodium  (COLACE) capsule 100 mg  100 mg Oral BID Cockerham, Alicia M, PA-C   100 mg at 04/12/24 9040   enoxaparin  (LOVENOX ) injection 40 mg  40 mg Subcutaneous Q24H Edna Toribio LABOR, MD   40 mg at 04/12/24 9040   feeding supplement (ENSURE PLUS HIGH PROTEIN) liquid 237 mL  237 mL Oral BID BM Edna Toribio LABOR, MD   237 mL at 04/10/24 9082   furosemide  (LASIX ) tablet 20 mg  20 mg Oral Daily PRN Cockerham, Alicia M, PA-C       hydrochlorothiazide  (HYDRODIURIL ) tablet 12.5 mg  12.5 mg Oral Daily Mark Bard LABOR, RPH   12.5 mg at 04/12/24 9041   And   losartan  (COZAAR ) tablet 100 mg  100 mg Oral Daily Mark Bard LABOR, RPH   100 mg at 04/12/24 9040   lidocaine  (LIDODERM ) 5 % 1-3 patch  1-3 patch Transdermal Daily PRN Cockerham, Alicia M, PA-C   2  patch at 04/08/24 2128   lisdexamfetamine (VYVANSE ) capsule 50 mg  50 mg Oral Daily Edna Toribio LABOR, MD   50 mg at 04/12/24 9041   loratadine  (CLARITIN ) tablet 10 mg  10 mg Oral Daily PRN Cockerham, Alicia M, PA-C       menthol -cetylpyridinium (CEPACOL) lozenge 3 mg  1 lozenge Oral PRN Cockerham, Alicia M, PA-C       Or   phenol (CHLORASEPTIC) mouth spray 1 spray  1 spray Mouth/Throat PRN Cockerham, Alicia M, PA-C       methocarbamol  (ROBAXIN ) tablet 500 mg  500 mg Oral Q6H PRN Cockerham, Alicia M, PA-C   500 mg at 04/11/24 1746   Or   methocarbamol  (ROBAXIN ) injection 500 mg  500 mg Intravenous Q6H PRN Cockerham, Alicia M, PA-C       naloxone  (NARCAN ) injection 0.4 mg  0.4 mg Intravenous PRN  Edna Toribio LABOR, MD   0.4 mg at 04/05/24 1229   ondansetron  (ZOFRAN ) tablet 4 mg  4 mg Oral Q6H PRN Cockerham, Alicia M, PA-C   4 mg at 04/12/24 0630   Or   ondansetron  (ZOFRAN ) injection 4 mg  4 mg Intravenous Q6H PRN Cockerham, Alicia M, PA-C       oxyCODONE  (Oxy IR/ROXICODONE ) immediate release tablet 5-10 mg  5-10 mg Oral Q4H PRN Edna Toribio LABOR, MD   5 mg at 04/12/24 1004   pantoprazole  (PROTONIX ) EC tablet 40 mg  40 mg Oral Daily Cockerham, Alicia M, PA-C   40 mg at 04/12/24 0958   polyethylene glycol (MIRALAX  / GLYCOLAX ) packet 17 g  17 g Oral Daily PRN Cockerham, Alicia M, PA-C       rosuvastatin  (CRESTOR ) tablet 10 mg  10 mg Oral Daily Edna Toribio LABOR, MD   10 mg at 04/12/24 9040   senna (SENOKOT) tablet 8.6 mg  1 tablet Oral BID Cockerham, Alicia M, PA-C   8.6 mg at 04/12/24 9041   sodium chloride  flush (NS) 0.9 % injection 10-40 mL  10-40 mL Intracatheter Q12H Marchwiany, Daniel A, MD   10 mL at 04/09/24 1043   sodium chloride  flush (NS) 0.9 % injection 10-40 mL  10-40 mL Intracatheter PRN Edna Toribio LABOR, MD         Discharge Medications: Please see discharge summary for a list of discharge medications.  Relevant Imaging Results:  Relevant Lab  Results:   Additional Information SS# [id number]; please note pt with PICC and plan for ongoing abx  Brit Wernette, LCSW

## 2024-04-12 NOTE — Progress Notes (Signed)
 PROGRESS NOTE    [place] [name]  FMW:[id number] DOB: 12/20/1975 DOA: 04/04/2024 PCP: Wonda Worth SQUIBB, PA   Brief Narrative:  48 year old woman PMH sleeve gastrectomy, multiple joint surgeries, chronic pain syndrome, admitted for semielective right THA explant for chronic prosthetic joint infection.  Status post surgery 7/16.  7/17 patient developed lethargy and hospital service was consulted for further evaluation.  Assessment & Plan:   Principal Problem:   Failed total hip arthroplasty (HCC) Active Problems:   Essential hypertension   Depressive disorder   Chronic pain syndrome   Acute encephalopathy   Thrombocytopenia (HCC)   Normocytic anemia   Delirium  Lethargy Acute encephalopathy Postoperative delirium, resolving Workup thus far negative for any metabolic etiology  Imaging negative for acute infection Appreciate neurology and psychiatry consultation.  Her symptoms for seen postsurgically and are unlikely related to antibiotics Patient continues to improve, anticipate ongoing improvement   Chronic PJI s/p right explant and antibiotic spacer 04/04/2024: Management per orthopedics and infectious disease. Continue antibiotics per ID   Thrombocytopenia Normocytic anemia Thrombocytopenia perioperative in nature, stable Hemoglobin stable   Hypertension: BP is stable.  Continue Coreg , HCTZ, losartan     Chronic pain syndrome: Home regimen includes Percocet 10-325 q6h prn, fill consistently per PDMP.  Has not been taking oral pain meds due to mentation and will be at risk for withdrawal.    Depression/anxiety: Continue Wellbutrin .   DVT prophylaxis: enoxaparin  (LOVENOX ) injection 40 mg Start: 04/07/24 1000 SCDs Start: 04/04/24 1726 Place TED hose Start: 04/04/24 1726 Code Status:   Code Status: Full Code Family Communication: Mother and son at bedside  Status is: Inpatient  Dispo: The patient is from: Home              Anticipated d/c is to: To be determined               Anticipated d/c date is: 24 to 48 hours              Patient currently not medically stable for discharge  Consultants:  Ortho primary, neurology also consulted along with hospitalist team  Procedures:  right hip explant and antibiotic spacer on 04/04/2024   Antimicrobials:  daptomycin    Subjective: No acute issues/events overnight, difficulty sleeping per family  Objective: Vitals:   04/11/24 0528 04/11/24 1411 04/11/24 2000 04/12/24 0434  BP: (!) 152/92 135/79 (!) 140/87 137/87  Pulse: 88 (!) 103 87 77  Resp: 17 20 14 18   Temp: 98.2 F (36.8 C) 98.7 F (37.1 C) 98.3 F (36.8 C) 98.5 F (36.9 C)  TempSrc: Oral  Oral Oral  SpO2: 100% 99% 100% 99%  Weight:      Height:        Intake/Output Summary (Last 24 hours) at 04/12/2024 0710 Last data filed at 04/12/2024 0600 Gross per 24 hour  Intake 549.24 ml  Output 1950 ml  Net -1400.76 ml   Filed Weights   04/04/24 0928  Weight: 117 kg    Examination:  General:  Pleasantly resting in bed, No acute distress. HEENT:  Normocephalic atraumatic.  Sclerae nonicteric, noninjected.  Extraocular movements intact bilaterally. Neck:  Without mass or deformity.  Trachea is midline. Lungs:  Clear to auscultate bilaterally without rhonchi, wheeze, or rales. Heart:  Regular rate and rhythm.  Without murmurs, rubs, or gallops. Abdomen:  Soft, nontender, nondistended.  Without guarding or rebound. Extremities: Without cyanosis, clubbing, edema, or obvious deformity. Skin:  Warm and dry, no erythema.  Data Reviewed: I have personally  reviewed following labs and imaging studies  CBC: Recent Labs  Lab 04/05/24 1400 04/06/24 0319 04/07/24 0318 04/08/24 0325 04/11/24 1128  WBC 12.4* 10.6* 10.2 7.5 9.5  HGB 10.9* 9.9* 9.2* 8.7* 9.6*  HCT 35.6* 31.1* 29.3* 28.7* 30.4*  MCV 84.0 82.5 82.5 84.2 81.9  PLT 112* 112* 103* 116* 259   Basic Metabolic Panel: Recent Labs  Lab 04/05/24 1924 04/06/24 0319 04/07/24 0318  04/08/24 0325 04/11/24 1128  NA 138 139 137 139 138  K 3.6 3.5 3.4* 3.5 3.4*  CL 105 106 105 107 104  CO2 24 23 22 22 26   GLUCOSE 123* 120* 104* 89 152*  BUN 13 12 10 11 9   CREATININE 1.02* 0.61 0.68 0.69 0.75  CALCIUM  7.9* 7.8* 7.8* 7.8* 8.3*  MG 1.8  --  1.8  --   --   PHOS  --   --  2.9  --   --    GFR: Estimated Creatinine Clearance: 119.9 mL/min (by C-G formula based on SCr of 0.75 mg/dL).  Liver Function Tests: Recent Labs  Lab 04/05/24 1924 04/07/24 0318 04/11/24 1128  AST 38 23 27  ALT 21 17 21   ALKPHOS 52 46 58  BILITOT 0.7 0.9 0.9  PROT 6.0* 5.7* 6.6  ALBUMIN 2.6* 2.5* 2.5*   Recent Labs  Lab 04/05/24 1951  AMMONIA 25   Cardiac Enzymes: Recent Labs  Lab 04/07/24 0318 04/12/24 0322  CKTOTAL 484* 110   CBG: Recent Labs  Lab 04/05/24 1755  GLUCAP 122*    Recent Results (from the past 240 hours)  Fungus Culture With Stain     Status: None (Preliminary result)   Collection Time: 04/04/24 12:38 PM   Specimen: Synovial, Right Hip; Body Fluid  Result Value Ref Range Status   Fungus Stain Final report  Final    Comment: (NOTE) Performed At: [facility] [address] [place], [place] [id number] Jennette Shorter MD Ey:[id number]    Fungus (Mycology) Culture PENDING  Incomplete   Fungal Source TISSUE  Final    Comment: RIGHT HIP 1A Performed at [facility], [address] [place], [place] [address]   Aerobic/Anaerobic Culture w Gram Stain (surgical/deep wound)     Status: None   Collection Time: 04/04/24 12:38 PM   Specimen: Synovial, Right Hip; Body Fluid  Result Value Ref Range Status   Specimen Description   Final    TISSUE Performed at [facility], [address], [place], [place] [address]    Special Requests R HIP SAMPLE 1A  Final   Gram Stain NO WBC SEEN NO ORGANISMS SEEN   Final   Culture   Final    No growth aerobically or anaerobically. Performed at [facility], [address],  [place], [place] [address]    Report Status 04/09/2024 FINAL  Final  Fungus Culture Result     Status: None   Collection Time: 04/04/24 12:38 PM  Result Value Ref Range Status   Result 1 Comment  Final    Comment: (NOTE) KOH/Calcofluor preparation:  no fungus observed. Performed At: [facility] [address] [place], [place] [id number] Jennette Shorter MD Ey:[id number]   Aerobic/Anaerobic Culture w Gram Stain (surgical/deep wound)     Status: None (Preliminary result)   Collection Time: 04/04/24 12:46 PM   Specimen: Synovial, Right Hip; Body Fluid  Result Value Ref Range Status   Specimen Description   Final    TISSUE Performed at [facility], [address], [place], [place] [address]    Special Requests R HIP SAMPLE B HOLD 2WKS  Final   Gram Stain NO WBC SEEN NO ORGANISMS SEEN   Final   Culture   Final    NO GROWTH 7 DAYS CONTINUING TO HOLD Performed at [facility], [address], [place], [place] [address]    Report Status PENDING  Incomplete  Fungus Culture With Stain     Status: None   Collection Time: 04/04/24 12:49 PM   Specimen: Synovial, Right Hip; Body Fluid  Result Value Ref Range Status   Fungus Stain Final report  Final    Comment: (NOTE) Performed At: [facility] [address] [place], [place] [id number] Jennette Shorter MD Ey:[id number]    Fungus (Mycology) Culture PENDING  Incomplete   Fungal Source RT HIP SAMPLE C  Corrected    Comment: Performed at [facility], [address] [place], [place] [address] CORRECTED ON 07/16 AT 2115: PREVIOUSLY REPORTED AS TISSUE RIGHT HIP   Aerobic/Anaerobic Culture w Gram Stain (surgical/deep wound)     Status: None (Preliminary result)   Collection Time: 04/04/24 12:49 PM   Specimen: Synovial, Right Hip; Body Fluid  Result Value Ref Range Status   Specimen Description   Final    TISSUE Performed at [facility], [address], [place], [place]  [address]    Special Requests   Final    R HIP Performed at [facility], [address], [place], [place] [address]    Gram Stain NO WBC SEEN RARE GRAM POSITIVE COCCI   Final   Culture   Final    NO GROWTH 7 DAYS CONTINUING TO HOLD Performed at [facility], [address], [place], [place] [address]    Report Status PENDING  Incomplete  Fungus Culture Result     Status: None   Collection Time: 04/04/24 12:49 PM  Result Value Ref Range Status   Result 1 Comment  Final    Comment: (NOTE) KOH/Calcofluor preparation:  no fungus observed. Performed At: [facility] [address] [place], [place] [id number] Jennette Shorter MD Ey:[id number]   Fungus Culture With Stain     Status: None (Preliminary result)   Collection Time: 04/04/24  1:31 PM   Specimen: Path Tissue  Result Value Ref Range Status   Fungus Stain Final report  Final    Comment: (NOTE) Performed At: [facility] [address] [place], [place] [id number] Jennette Shorter MD Ey:[id number]    Fungus (Mycology) Culture PENDING  Incomplete   Fungal Source TISSUE  Final    Comment: RIGHT HIP Performed at [facility], [address] [place], [place] [address]   Aerobic/Anaerobic Culture w Gram Stain (surgical/deep wound)     Status: None (Preliminary result)   Collection Time: 04/04/24  1:31 PM   Specimen: Path Tissue  Result Value Ref Range Status   Specimen Description   Final    TISSUE Performed at [facility], [address] [place], [place] [address]    Special Requests R HIP SAMPLE D HOLD 14 DAYSQ  Final   Gram Stain   Final    FEW WBC PRESENT,BOTH PMN AND MONONUCLEAR NO ORGANISMS SEEN    Culture   Final    NO GROWTH 7 DAYS CONTINUING TO HOLD Performed at [facility], [address], [place], [place] [address]    Report Status PENDING  Incomplete  Fungus Culture Result     Status: None   Collection Time: 04/04/24  1:31  PM  Result Value Ref Range Status   Result 1 Comment  Final    Comment: (NOTE) KOH/Calcofluor preparation:  no fungus observed. Performed At: [facility] [address] [place], [place] [id number] Jennette Shorter MD Ey:[id number]          Radiology Studies: EEG adult Result Date: 04/10/2024 Shelton Arlin KIDD, MD     04/10/2024  4:28 PM Patient Name: [place] [name] MRN: [id number] Epilepsy Attending: Arlin KIDD Shelton Referring Physician/Provider: Merrianne Locus, MD Date: 04/10/2024 Duration: 22.13 mins Patient history: 48yo female with ams. EEG to evaluate for seizure. Level of alertness: Awake AEDs during EEG study: None Technical aspects: This EEG study was done with scalp electrodes positioned according to the 10-20 International system of electrode placement. Electrical activity was reviewed with band pass filter of 1-70Hz , sensitivity of 7 uV/mm, display speed of 65mm/sec with a 60Hz  notched filter applied as appropriate. EEG data were recorded continuously and digitally stored.  Video monitoring was available and reviewed as appropriate. Description: The posterior dominant rhythm consists of 9-10 Hz activity of moderate voltage (25-35 uV) seen predominantly in posterior head regions, symmetric and reactive to eye opening and eye closing. Hyperventilation and photic stimulation were not performed.   IMPRESSION: This study is within normal limits. No seizures or epileptiform discharges were seen throughout the recording. A normal interictal EEG does not exclude  the diagnosis of epilepsy. Priyanka O Yadav        Scheduled Meds:  buPROPion   150 mg Oral Daily   carvedilol   6.25 mg Oral BID WC   Chlorhexidine  Gluconate Cloth  6 each Topical Daily   docusate sodium   100 mg Oral BID   enoxaparin   40 mg Subcutaneous Q24H   feeding supplement  237 mL Oral BID BM   hydrochlorothiazide   12.5 mg Oral Daily   And   losartan   100 mg Oral Daily   lisdexamfetamine  50 mg Oral Daily    pantoprazole   40 mg Oral Daily   rosuvastatin   10 mg Oral Daily   senna  1 tablet Oral BID   sodium chloride  flush  10-40 mL Intracatheter Q12H   Continuous Infusions:  sodium chloride  10 mL/hr at 04/11/24 1512   DAPTOmycin  Stopped (04/11/24 1504)     LOS: 8 days   Time spent:  Elsie JAYSON Mont[place], DO [facility]  If 7PM-7AM, please contact night-coverage [email or web address]  04/12/2024, 7:10 AM

## 2024-04-12 NOTE — Plan of Care (Signed)
 Problem: Clinical Measurements: Goal: Ability to maintain clinical measurements within normal limits will improve Outcome: Progressing   Problem: Nutrition: Goal: Adequate nutrition will be maintained Outcome: Progressing   Problem: Coping: Goal: Level of anxiety will decrease Outcome: Progressing   Problem: Safety: Goal: Ability to remain free from injury will improve Outcome: Progressing   Jon LULLA Reins, RN 04/12/24 5:00 PM

## 2024-04-12 NOTE — Progress Notes (Signed)
 PHYSICAL THERAPY  Assisted NT using Maxi Move LIFT to get Pt back to bed.  Positioned to comfort. Call light in reach and friend at bed side.  Katheryn Leap  PTA Acute  Rehabilitation Services Office M-F          [phone]

## 2024-04-12 NOTE — TOC PASRR Note (Signed)
 30 Day PASRR Note   Patient Details  Name: [place] [name] Date of Birth: 1976-01-01   Transition of Care [facility]) CM/SW Contact:    NORMAN ASPEN, LCSW Phone Number: 04/12/2024, 1:45 PM  To Whom It May Concern:  Please be advised that this patient will require a short-term nursing home stay - anticipated 30 days or less for rehabilitation and strengthening.   The plan is for return home.

## 2024-04-12 NOTE — Progress Notes (Signed)
     Subjective: Patient's speech appears less groggy and more at baseline today.  She is very anxious this morning.  Did not get to work with therapy yesterday.  Family feels like she is talking and acting more like herself.  She is asking about her daughter.  Hopefully will mobilize better today so we can work towards discharge home.  Objective:   VITALS:   Vitals:   04/11/24 0528 04/11/24 1411 04/11/24 2000 04/12/24 0434  BP: (!) 152/92 135/79 (!) 140/87 137/87  Pulse: 88 (!) 103 87 77  Resp: 17 20 14 18   Temp: 98.2 F (36.8 C) 98.7 F (37.1 C) 98.3 F (36.8 C) 98.5 F (36.9 C)  TempSrc: Oral  Oral   SpO2: 100% 99% 100% 99%  Weight:      Height:        Sensation intact distally Intact pulses distally Dorsiflexion/Plantar flexion intact Incision: dressing C/D/I Compartment soft    Lab Results  Component Value Date   WBC 9.5 04/11/2024   HGB 9.6 (L) 04/11/2024   HCT 30.4 (L) 04/11/2024   MCV 81.9 04/11/2024   PLT 259 04/11/2024   BMET    Component Value Date/Time   NA 138 04/11/2024 1128   NA 139 10/21/2023 1054   K 3.4 (L) 04/11/2024 1128   CL 104 04/11/2024 1128   CO2 26 04/11/2024 1128   GLUCOSE 152 (H) 04/11/2024 1128   BUN 9 04/11/2024 1128   BUN 8 10/21/2023 1054   CREATININE 0.75 04/11/2024 1128   CALCIUM  8.3 (L) 04/11/2024 1128   CALCIUM  8.3 (L) 08/28/2010 0525   EGFR 91 10/21/2023 1054   GFRNONAA >60 04/11/2024 1128    Xray: Antibiotic spacer components in good position no adverse features  Assessment/Plan: 8 Days Post-Op   Principal Problem:   Failed total hip arthroplasty (HCC) Active Problems:   Essential hypertension   Depressive disorder   Chronic pain syndrome   Acute encephalopathy   Thrombocytopenia (HCC)   Normocytic anemia   Delirium  Status post right explant antibiotic spacer placement for chronic prosthetic joint infection  Post op recs: WB: 20% weightbearing right lower extremity, posterior hip precautions Abx:  Currently advised broad-spectrum antibiotics with daptomycin  and unasyn  per infectious disease.  Recs are appreciated.  IntraOp cultures with 1 out of 4 showing rare gram-positive cocci we will continue to follow cultures for growth. imaging: PACU pelvis Xray Dressing: dermabond and aquacell dressing DVT prophylaxis: Eliquis  2.5 mg postop day 1 Follow up: 1 week after surgery for a wound check with Dr. Edna at [facility].  Address: [address], [place], [place] [address]  Office Phone: [phone]   TORIBIO DELENA EDNA 04/12/2024, 6:35 AM   TORIBIO Edna, MD  Contact information:   [phone] 7am-5pm epic message Dr. Edna, or call office for patient follow up: [phone] After hours and holidays please check [email or web address] for group call information for [facility]

## 2024-04-12 NOTE — Progress Notes (Signed)
 Patient was incontinent of large BM this morning, says she did not know that she was going, also voiced I am seeing things that aren't there, like snakes ans spiders, I asked if she was seeing any now and she answered no. Will make oncoming RN aware so that rounding team can be notified of patients new symptom.

## 2024-04-12 NOTE — Progress Notes (Signed)
 Physical Therapy Treatment Patient Details Name: [place] [name] MRN: [id number] DOB: 1976/04/16 Today's Date: 04/12/2024   History of Present Illness [place] [name] is a 48 y.o. F admitted 04/04/2024 for a planned explant of right hip hardware with antibiotic spacer placement for chronic prosthetic joint infection. PMH:  HTN, depression/anxiety, s/p sleeve gastrectomy, s/p right THA 2017, chronic pain syndrome    PT Comments  PT - Cognition Comments: much improved cognition and level of alertness.  Fully engaging with Therapist and friend in room.  Following all instructions.  Slow to progress due to pain level despite pre medication.  Pt grimacing in pain.  Still puts forth very little self effort but wants to get on a BSC. Assisted to EOB was difficult.  General bed mobility comments: Required Max encouragement and increased time to complete transfering to EOB.  Pt required Total Assist + 2 for upper body Pt 10% effort.  Total Assist + 2 scooting to EOB Pt 0%.  Pt screaming with pain.  Lateral/Scoot Transfers: Total assist, +2 physical assistance, +2 safety/equipment, With slide board, From elevated surface.  Positioned in recliner to comfort. Rec LIFT back to bed.  Pt will need ST Rehab at SNF to address mobility and functional decline prior to safely returning home.    If plan is discharge home, recommend the following: Two people to help with walking and/or transfers;Two people to help with bathing/dressing/bathroom;Assistance with cooking/housework;Assist for transportation;Help with stairs or ramp for entrance   Can travel by private vehicle     No  Equipment Recommendations  Rolling walker (2 wheels)    Recommendations for Other Services       Precautions / Restrictions Precautions Precautions: Posterior Hip Recall of Precautions/Restrictions: Impaired Restrictions Weight Bearing Restrictions Per Provider Order: Yes RLE Weight Bearing Per Provider Order: Partial weight  bearing RLE Partial Weight Bearing Percentage or Pounds: 20 lbs Other Position/Activity Restrictions: posterior hip     Mobility  Bed Mobility Overal bed mobility: Needs Assistance Bed Mobility: Supine to Sit     Supine to sit: HOB elevated, Used rails, Total assist, +2 for physical assistance     General bed mobility comments: Required Max encouragement and increased time to complete transfering to EOB.  Pt required Total Assist + 2 for upper body Pt 10% effort.  Total Assist + 2 scooting to EOB Pt 0%.  Pt screaming with pain.    Transfers Overall transfer level: Needs assistance Equipment used: Sliding board Transfers: Bed to chair/wheelchair/BSC            Lateral/Scoot Transfers: Total assist, +2 physical assistance, +2 safety/equipment, With slide board, From elevated surface General transfer comment: assisted from elevated bed to drop arm recliner via sliding board required + 3 Total Assist as Pt offered 0% self effort.  Pt actually pushing in the opposite direction.  Maxi Move Pad placed in recliner.  Rec LIFT back to bed.    Ambulation/Gait                   Stairs             Wheelchair Mobility     Tilt Bed    Modified Rankin (Stroke Patients Only)       Balance  Communication Communication Communication: Impaired (distracted by pain)  Cognition Arousal: Alert Behavior During Therapy: WFL for tasks assessed/performed   PT - Cognitive impairments: No apparent impairments                       PT - Cognition Comments: much improved cognition and level of alertness.  Fully engaging with Therapist and friend in room.  Following all instructions.  Slow to progress due to pain level despite pre medication.  Pt grimacing in pain.  Still puts forth very little self effort but wants to get on a BSC. Following commands: Impaired Following commands impaired: Only follows  one step commands consistently    Cueing Cueing Techniques: Verbal cues, Gestural cues, Tactile cues  Exercises      General Comments        Pertinent Vitals/Pain Pain Assessment Pain Assessment: 0-10 Pain Score: 10-Worst pain ever Pain Location: R hip Pain Descriptors / Indicators: Crying, Grimacing, Guarding, Moaning, Operative site guarding Pain Intervention(s): Monitored during session, Premedicated before session, Repositioned, Ice applied    Home Living                          Prior Function            PT Goals (current goals can now be found in the care plan section) Progress towards PT goals: Progressing toward goals    Frequency    7X/week      PT Plan      Co-evaluation              AM-PAC PT 6 Clicks Mobility   Outcome Measure  Help needed turning from your back to your side while in a flat bed without using bedrails?: Total Help needed moving from lying on your back to sitting on the side of a flat bed without using bedrails?: Total Help needed moving to and from a bed to a chair (including a wheelchair)?: Total Help needed standing up from a chair using your arms (e.g., wheelchair or bedside chair)?: Total Help needed to walk in hospital room?: Total Help needed climbing 3-5 steps with a railing? : Total 6 Click Score: 6    End of Session   Activity Tolerance: Patient limited by pain Patient left: in chair;with call bell/phone within reach Nurse Communication: Mobility status;Need for lift equipment PT Visit Diagnosis: Difficulty in walking, not elsewhere classified (R26.2);Pain;Muscle weakness (generalized) (M62.81) Pain - Right/Left: Right Pain - part of body: Hip     Time: 8867-8842 PT Time Calculation (min) (ACUTE ONLY): 25 min  Charges:    $Therapeutic Activity: 23-37 mins PT General Charges $$ ACUTE PT VISIT: 1 Visit                     Katheryn Leap  PTA Acute  Rehabilitation Services Office M-F           [phone]

## 2024-04-12 NOTE — TOC Progression Note (Addendum)
 Transition of Care [facility]) - Progression Note    Patient Details  Name: [place] [name] MRN: [id number] Date of Birth: 10-27-1975  [facility] of [facility]) CM/SW Contact  NORMAN ASPEN, LCSW Phone Number: 04/12/2024, 1:54 PM  Clinical Narrative:     ADDENDUM: FL2 out to facilities.  PASRR initiated but await # (all requested clinicals uploaded this afternoon).  Met with patient and mother to review PT recommendations now for SNF rehab.  Mother was aware this was being considered and she and patient are agreeable with this plan.  They acknowledge that pt's current physical assistance needs are still too great for family to manage in the home.  Reviewed the SNF placement process and will begin bed search today.  Have alerted MD to this as well.    Barriers to Discharge: Continued Medical Work up               Expected Discharge Plan and Services                         DME Arranged: Walker rolling DME Agency: [employer]       HH Arranged: IV Antibiotics HH Agency: [facility] Date HH Agency Contacted: 04/06/24 Time HH Agency Contacted: 1200 Representative spoke with at [facility] Agency: Holley Herring   Social Drivers of Health (SDOH) Interventions SDOH Screenings   Food Insecurity: Patient Declined (04/04/2024)  Housing: Unknown (04/04/2024)  Transportation Needs: Patient Declined (04/04/2024)  Utilities: Patient Declined (04/04/2024)  Depression (PHQ2-9): Low Risk  (07/08/2020)  Tobacco Use: Low Risk  (04/04/2024)    Readmission Risk Interventions    04/05/2024    1:25 PM  Readmission Risk Prevention Plan  Post Dischage Appt Complete  Medication Screening Complete  Transportation Screening Complete

## 2024-04-13 DIAGNOSIS — G894 Chronic pain syndrome: Secondary | ICD-10-CM | POA: Diagnosis not present

## 2024-04-13 DIAGNOSIS — G9340 Encephalopathy, unspecified: Secondary | ICD-10-CM | POA: Diagnosis not present

## 2024-04-13 DIAGNOSIS — T84018D Broken internal joint prosthesis, other site, subsequent encounter: Secondary | ICD-10-CM | POA: Diagnosis not present

## 2024-04-13 DIAGNOSIS — R410 Disorientation, unspecified: Secondary | ICD-10-CM | POA: Diagnosis not present

## 2024-04-13 NOTE — Plan of Care (Signed)
  Problem: Clinical Measurements: Goal: Ability to maintain clinical measurements within normal limits will improve Outcome: Progressing Goal: Will remain free from infection Outcome: Progressing Goal: Diagnostic test results will improve Outcome: Progressing Goal: Respiratory complications will improve Outcome: Progressing Goal: Cardiovascular complication will be avoided Outcome: Progressing   Problem: Activity: Goal: Risk for activity intolerance will decrease Outcome: Progressing   Problem: Nutrition: Goal: Adequate nutrition will be maintained Outcome: Progressing   Problem: Pain Managment: Goal: General experience of comfort will improve and/or be controlled Outcome: Progressing   Problem: Safety: Goal: Ability to remain free from injury will improve Outcome: Progressing   Problem: Skin Integrity: Goal: Risk for impaired skin integrity will decrease Outcome: Progressing   Problem: Education: Goal: Knowledge of the prescribed therapeutic regimen will improve Outcome: Progressing   Problem: Activity: Goal: Ability to avoid complications of mobility impairment will improve Outcome: Progressing Goal: Ability to tolerate increased activity will improve Outcome: Progressing   Problem: Clinical Measurements: Goal: Postoperative complications will be avoided or minimized Outcome: Progressing   Problem: Pain Management: Goal: Pain level will decrease with appropriate interventions Outcome: Progressing   Problem: Skin Integrity: Goal: Will show signs of wound healing Outcome: Progressing

## 2024-04-13 NOTE — Progress Notes (Signed)
     Subjective: Ashley [name] speaking articulated this morning.  She states she feels more like herself now.  She is very weak due to poor participation in mobility with therapy since surgery.  Hopefully now that she has improved cognitively will be able to participate better with therapy and work on mobilization.  No new concerns.  Family at bedside.  Objective:   VITALS:   Vitals:   04/12/24 0434 04/12/24 0828 04/12/24 2111 04/13/24 0529  BP: 137/87 120/74 123/76 118/74  Pulse: 77 (!) 102 85 77  Resp: 18 18 16 16   Temp: 98.5 F (36.9 C) 97.9 F (36.6 C) 98.6 F (37 C) 98.8 F (37.1 C)  TempSrc: Oral Oral    SpO2: 99% 100% 100% 98%  Weight:      Height:        Sensation intact distally Intact pulses distally Dorsiflexion/Plantar flexion intact Incision: dressing C/D/I Compartment soft    Lab Results  Component Value Date   WBC 9.5 04/11/2024   HGB 9.6 (L) 04/11/2024   HCT 30.4 (L) 04/11/2024   MCV 81.9 04/11/2024   PLT 259 04/11/2024   BMET    Component Value Date/Time   NA 138 04/11/2024 1128   NA 139 10/21/2023 1054   K 3.4 (L) 04/11/2024 1128   CL 104 04/11/2024 1128   CO2 26 04/11/2024 1128   GLUCOSE 152 (H) 04/11/2024 1128   BUN 9 04/11/2024 1128   BUN 8 10/21/2023 1054   CREATININE 0.75 04/11/2024 1128   CALCIUM  8.3 (L) 04/11/2024 1128   CALCIUM  8.3 (L) 08/28/2010 0525   EGFR 91 10/21/2023 1054   GFRNONAA >60 04/11/2024 1128    Xray: Antibiotic spacer components in good position no adverse features  Assessment/Plan: 9 Days Post-Op   Principal Problem:   Failed total hip arthroplasty (HCC) Active Problems:   Essential hypertension   Depressive disorder   Chronic pain syndrome   Acute encephalopathy   Thrombocytopenia (HCC)   Normocytic anemia   Delirium  Status post right explant antibiotic spacer placement for chronic prosthetic joint infection  Post op recs: WB: 20% weightbearing right lower extremity, posterior hip  precautions Abx: Currently advised broad-spectrum antibiotics with daptomycin  and unasyn  per infectious disease.  Recs are appreciated.  IntraOp cultures with 1 out of 4 showing rare gram-positive cocci we will continue to follow cultures for growth. imaging: PACU pelvis Xray Dressing: dermabond and aquacell dressing DVT prophylaxis: Eliquis  2.5 mg postop day 1 Follow up: 1 week after surgery for a wound check with Dr. Edna at [facility].  Address: [address], [place], [place] [address]  Office Phone: [phone]   TORIBIO DELENA EDNA 04/13/2024, 7:03 AM   TORIBIO Edna, MD  Contact information:   [phone] 7am-5pm epic message Dr. Edna, or call office for patient follow up: [phone] After hours and holidays please check [email or web address] for group call information for [facility]

## 2024-04-13 NOTE — Progress Notes (Signed)
 Physical Therapy Treatment Patient Details Name: Ashley [name] MRN: [id number] DOB: 01/18/1976 Today's Date: 04/13/2024   History of Present Illness Ashley [name] is a 48 y.o. F admitted 04/04/2024 for a planned explant of right hip hardware with antibiotic spacer placement for chronic prosthetic joint infection. PMH:  HTN, depression/anxiety, s/p sleeve gastrectomy, s/p right THA 2017, chronic pain syndrome    PT Comments  The patient is alert, did  vocalize at times, generally not answering questions. Patient requires  total assist  to move to sitting, patient is noted to self assist about 20% using UE's, Use of sliding board  to transfer to recliner with total +3 assist.  Patient left sitting upright and  performing LAQ using belt. Friend in room.  Patient will benefit from continued inpatient follow up therapy, <3 hours/day  Patient given pain meds  at start of session.     If plan is discharge home, recommend the following: Two people to help with walking and/or transfers;Two people to help with bathing/dressing/bathroom;Assistance with cooking/housework;Assist for transportation;Help with stairs or ramp for entrance   Can travel by private vehicle     No  Equipment Recommendations  Rolling walker (2 wheels)    Recommendations for Other Services       Precautions / Restrictions Precautions Precautions: Posterior Hip Recall of Precautions/Restrictions: Impaired Restrictions Weight Bearing Restrictions Per Provider Order: Yes RLE Partial Weight Bearing Percentage or Pounds: 20%     Mobility  Bed Mobility   Bed Mobility: Supine to Sit     Supine to sit: HOB elevated, Used rails, +2 for physical assistance, +2 for safety/equipment, Max assist     General bed mobility comments: purple slide placed under buttocks, Apatient used  belt  to move leg  an few inches then relied on therapists to move both legs and sit trunk upright as turned to sitting on to bed edge.     Transfers Overall transfer level: Needs assistance Equipment used: Sliding board Transfers: Bed to chair/wheelchair/BSC            Lateral/Scoot Transfers: Total assist, +2 physical assistance, +2 safety/equipment, With slide board, From elevated surface General transfer comment: assisted from elevated bed to drop arm recliner via sliding board required + 3 Total Assist as Pt offered 10% self effort.  Pt  did attempt using the arms to push up  as sliding to chair.  Maxi Move Pad placed in recliner.  Rec LIFT back to bed.    Ambulation/Gait                   Stairs             Wheelchair Mobility     Tilt Bed    Modified Rankin (Stroke Patients Only)       Balance Overall balance assessment: Needs assistance Sitting-balance support: Feet supported, Single extremity supported Sitting balance-Leahy Scale: Fair Sitting balance - Comments: Pt sitting with CGA only and cues for wt shift to maintain balance                                    Communication Communication Communication: Impaired Factors Affecting Communication: Reduced clarity of speech  Cognition Arousal: Alert Behavior During Therapy: Flat affect   PT - Cognitive impairments: No apparent impairments  PT - Cognition Comments: awake and ansered yes or no , limited responses, never stated pain  level no. slow to respond but appears intact for arousal Following commands: Impaired Following commands impaired: Only follows one step commands consistently    Cueing Cueing Techniques: Verbal cues, Gestural cues, Tactile cues  Exercises General Exercises - Lower Extremity Long Arc Quad: AAROM, 10 reps, Right, Seated    General Comments        Pertinent Vitals/Pain Pain Assessment Pain Score: 10-Worst pain ever Pain Location: R hip Pain Descriptors / Indicators: Crying, Grimacing, Guarding, Moaning, Operative site guarding Pain Intervention(s):  Monitored during session, RN gave pain meds during session, Repositioned    Home Living                          Prior Function            PT Goals (current goals can now be found in the care plan section) Progress towards PT goals: Progressing toward goals    Frequency    7X/week      PT Plan      Co-evaluation              AM-PAC PT 6 Clicks Mobility   Outcome Measure  Help needed turning from your back to your side while in a flat bed without using bedrails?: Total Help needed moving from lying on your back to sitting on the side of a flat bed without using bedrails?: Total Help needed moving to and from a bed to a chair (including a wheelchair)?: Total Help needed standing up from a chair using your arms (e.g., wheelchair or bedside chair)?: Total Help needed to walk in hospital room?: Total Help needed climbing 3-5 steps with a railing? : Total 6 Click Score: 6    End of Session Equipment Utilized During Treatment: Gait belt Activity Tolerance: Patient limited by pain Patient left: in chair;with call bell/phone within reach;with family/visitor present Nurse Communication: Mobility status;Need for lift equipment PT Visit Diagnosis: Difficulty in walking, not elsewhere classified (R26.2);Pain;Muscle weakness (generalized) (M62.81);Other abnormalities of gait and mobility (R26.89) Pain - Right/Left: Right Pain - part of body: Hip     Time: 0940-1010 PT Time Calculation (min) (ACUTE ONLY): 30 min  Charges:    $Therapeutic Activity: 23-37 mins PT General Charges $$ ACUTE PT VISIT: 1 Visit                     Ashley Potters PT Acute Rehabilitation Services Office [phone]    Potters Ashley [name] 04/13/2024, 1:06 PM

## 2024-04-13 NOTE — Plan of Care (Signed)
  Problem: Health Behavior/Discharge Planning: Goal: Ability to manage health-related needs will improve Outcome: Progressing   Problem: Clinical Measurements: Goal: Ability to maintain clinical measurements within normal limits will improve Outcome: Progressing Goal: Will remain free from infection Outcome: Progressing Goal: Diagnostic test results will improve Outcome: Progressing Goal: Respiratory complications will improve Outcome: Progressing Goal: Cardiovascular complication will be avoided Outcome: Progressing   Problem: Activity: Goal: Risk for activity intolerance will decrease Outcome: Progressing   Problem: Nutrition: Goal: Adequate nutrition will be maintained Outcome: Progressing   Problem: Coping: Goal: Level of anxiety will decrease Outcome: Progressing   Problem: Pain Managment: Goal: General experience of comfort will improve and/or be controlled Outcome: Progressing   Problem: Safety: Goal: Ability to remain free from injury will improve Outcome: Progressing   Problem: Skin Integrity: Goal: Risk for impaired skin integrity will decrease Outcome: Progressing   Problem: Education: Goal: Knowledge of the prescribed therapeutic regimen will improve Outcome: Progressing Goal: Understanding of discharge needs will improve Outcome: Progressing   Problem: Activity: Goal: Ability to avoid complications of mobility impairment will improve Outcome: Progressing Goal: Ability to tolerate increased activity will improve Outcome: Progressing   Problem: Clinical Measurements: Goal: Postoperative complications will be avoided or minimized Outcome: Progressing   Problem: Pain Management: Goal: Pain level will decrease with appropriate interventions Outcome: Progressing   Problem: Skin Integrity: Goal: Will show signs of wound healing Outcome: Progressing

## 2024-04-13 NOTE — Progress Notes (Signed)
 PROGRESS NOTE    Ashley [name]  FMW:[id number] DOB: 05-30-1976 DOA: 04/04/2024 PCP: Wonda Worth SQUIBB, PA   Brief Narrative:  48 year old woman PMH sleeve gastrectomy, multiple joint surgeries, chronic pain syndrome, admitted for semielective right THA explant for chronic prosthetic joint infection.  Status post surgery 7/16.  7/17 patient developed lethargy and hospital service was consulted for further evaluation.  Give improving mental status approaching baseline and current stability [facility]ad will sign off, please do not hesitate to reach out if you need further assistance.  Patient appears medically stable from our standpoint for discharge to skilled nursing facility which appears to be current recommendations by therapy.  Assessment & Plan:   Principal Problem:   Failed total hip arthroplasty (HCC) Active Problems:   Essential hypertension   Depressive disorder   Chronic pain syndrome   Acute encephalopathy   Thrombocytopenia (HCC)   Normocytic anemia   Delirium  Lethargy, resolving Acute encephalopathy, resolving Postoperative delirium, resolving Workup thus far negative for any metabolic etiology  Imaging negative for acute infection Appreciate neurology and psychiatry consultation.  Her symptoms for seen postsurgically and are unlikely related to antibiotics Patient continues to improve, anticipate ongoing improvement   Chronic PJI s/p right explant and antibiotic spacer 04/04/2024: Management per orthopedics and infectious disease. Continue antibiotics per ID   Thrombocytopenia Normocytic anemia Thrombocytopenia perioperative in nature, stable Hemoglobin stable  Hypertension: BP is stable. Continue Coreg , HCTZ, losartan    Chronic pain syndrome: Home regimen includes Percocet 10-325 q6h prn, fill consistently per PDMP.  Has not been taking oral pain meds due to mentation and will be at risk for withdrawal.   Depression/anxiety: Continue Wellbutrin .  DVT  prophylaxis: enoxaparin  (LOVENOX ) injection 40 mg Start: 04/07/24 1000 SCDs Start: 04/04/24 1726 Place TED hose Start: 04/04/24 1726 Code Status:   Code Status: Full Code Family Communication: Mother and son at bedside  Status is: Inpatient  Dispo: The patient is from: Home              Anticipated d/c is to: To be determined              Anticipated d/c date is: 24 to 48 hours              Patient currently not medically stable for discharge  Consultants:  Ortho primary, neurology also consulted along with hospitalist team  Procedures:  right hip explant and antibiotic spacer on 04/04/2024   Antimicrobials:  daptomycin    Subjective: No acute issues/events overnight  Objective: Vitals:   04/12/24 0434 04/12/24 0828 04/12/24 2111 04/13/24 0529  BP: 137/87 120/74 123/76 118/74  Pulse: 77 (!) 102 85 77  Resp: 18 18 16 16   Temp: 98.5 F (36.9 C) 97.9 F (36.6 C) 98.6 F (37 C) 98.8 F (37.1 C)  TempSrc: Oral Oral    SpO2: 99% 100% 100% 98%  Weight:      Height:        Intake/Output Summary (Last 24 hours) at 04/13/2024 0811 Last data filed at 04/13/2024 0529 Gross per 24 hour  Intake 912.97 ml  Output 1150 ml  Net -237.03 ml   Filed Weights   04/04/24 0928  Weight: 117 kg    Examination:  General:  Pleasantly resting in bed, No acute distress. HEENT:  Normocephalic atraumatic.  Sclerae nonicteric, noninjected.  Extraocular movements intact bilaterally. Neck:  Without mass or deformity.  Trachea is midline. Lungs:  Clear to auscultate bilaterally without rhonchi, wheeze, or rales. Heart:  Regular rate  and rhythm.  Without murmurs, rubs, or gallops. Abdomen:  Soft, nontender, nondistended.  Without guarding or rebound. Extremities: Without cyanosis, clubbing, edema, or obvious deformity. Skin:  Warm and dry, no erythema.  Data Reviewed: I have personally reviewed following labs and imaging studies  CBC: Recent Labs  Lab 04/07/24 0318 04/08/24 0325  04/11/24 1128  WBC 10.2 7.5 9.5  HGB 9.2* 8.7* 9.6*  HCT 29.3* 28.7* 30.4*  MCV 82.5 84.2 81.9  PLT 103* 116* 259   Basic Metabolic Panel: Recent Labs  Lab 04/07/24 0318 04/08/24 0325 04/11/24 1128  NA 137 139 138  K 3.4* 3.5 3.4*  CL 105 107 104  CO2 22 22 26   GLUCOSE 104* 89 152*  BUN 10 11 9   CREATININE 0.68 0.69 0.75  CALCIUM  7.8* 7.8* 8.3*  MG 1.8  --   --   PHOS 2.9  --   --    GFR: Estimated Creatinine Clearance: 119.9 mL/min (by C-G formula based on SCr of 0.75 mg/dL).  Liver Function Tests: Recent Labs  Lab 04/07/24 0318 04/11/24 1128  AST 23 27  ALT 17 21  ALKPHOS 46 58  BILITOT 0.9 0.9  PROT 5.7* 6.6  ALBUMIN 2.5* 2.5*   No results for input(s): AMMONIA in the last 168 hours.  Cardiac Enzymes: Recent Labs  Lab 04/07/24 0318 04/12/24 0322  CKTOTAL 484* 110   CBG: No results for input(s): GLUCAP in the last 168 hours.   Recent Results (from the past 240 hours)  Fungus Culture With Stain     Status: None (Preliminary result)   Collection Time: 04/04/24 12:38 PM   Specimen: Synovial, Right Hip; Body Fluid  Result Value Ref Range Status   Fungus Stain Final report  Final    Comment: (NOTE) Performed At: [facility] [address] [place], [place] [id number] Jennette Shorter MD Ey:[id number]    Fungus (Mycology) Culture PENDING  Incomplete   Fungal Source TISSUE  Final    Comment: RIGHT HIP 1A Performed at [facility], [address] [place], [place] [address]   Aerobic/Anaerobic Culture w Gram Stain (surgical/deep wound)     Status: None   Collection Time: 04/04/24 12:38 PM   Specimen: Synovial, Right Hip; Body Fluid  Result Value Ref Range Status   Specimen Description   Final    TISSUE Performed at [facility], [address], [place], [place] [address]    Special Requests R HIP SAMPLE 1A  Final   Gram Stain NO WBC SEEN NO ORGANISMS SEEN   Final   Culture   Final    No growth aerobically  or anaerobically. Performed at [facility], [address] [place], [place] [address]    Report Status 04/09/2024 FINAL  Final  Fungus Culture Result     Status: None   Collection Time: 04/04/24 12:38 PM  Result Value Ref Range Status   Result 1 Comment  Final    Comment: (NOTE) KOH/Calcofluor preparation:  no fungus observed. Performed At: [facility] [address] [place], [place] [id number] Jennette Shorter MD Ey:[id number]   Aerobic/Anaerobic Culture w Gram Stain (surgical/deep wound)     Status: None (Preliminary result)   Collection Time: 04/04/24 12:46 PM   Specimen: Synovial, Right Hip; Body Fluid  Result Value Ref Range Status   Specimen Description   Final    TISSUE Performed at [facility], [address], [place], [place] [address]    Special Requests  R HIP SAMPLE B HOLD 2WKS  Final   Gram Stain NO WBC SEEN NO ORGANISMS SEEN   Final   Culture   Final    NO GROWTH 8 DAYS CONTINUING TO HOLD Performed at [facility], [address], [place], [place] [address]    Report Status PENDING  Incomplete  Fungus Culture With Stain     Status: None   Collection Time: 04/04/24 12:49 PM   Specimen: Synovial, Right Hip; Body Fluid  Result Value Ref Range Status   Fungus Stain Final report  Final    Comment: (NOTE) Performed At: [facility] [address] [place], [place] [id number] Jennette Shorter MD Ey:[id number]    Fungus (Mycology) Culture PENDING  Incomplete   Fungal Source RT HIP SAMPLE C  Corrected    Comment: Performed at [facility], [address], [place], [place] [address] CORRECTED ON 07/16 AT 2115: PREVIOUSLY REPORTED AS TISSUE RIGHT HIP   Aerobic/Anaerobic Culture w Gram Stain (surgical/deep wound)     Status: None (Preliminary result)   Collection Time: 04/04/24 12:49 PM   Specimen: Synovial, Right Hip; Body Fluid  Result Value Ref Range Status   Specimen Description   Final    TISSUE Performed at  [facility], [address] [place], [place] [address]    Special Requests   Final    R HIP Performed at [facility], [address] [place], [place] [address]    Gram Stain NO WBC SEEN RARE GRAM POSITIVE COCCI   Final   Culture   Final    NO GROWTH 7 DAYS CONTINUING TO HOLD Performed at [facility], [address] [place], [place] [address]    Report Status PENDING  Incomplete  Fungus Culture Result     Status: None   Collection Time: 04/04/24 12:49 PM  Result Value Ref Range Status   Result 1 Comment  Final    Comment: (NOTE) KOH/Calcofluor preparation:  no fungus observed. Performed At: [facility] [address] [place], [place] [id number] Jennette Shorter MD Ey:[id number]   Fungus Culture With Stain     Status: None (Preliminary result)   Collection Time: 04/04/24  1:31 PM   Specimen: Path Tissue  Result Value Ref Range Status   Fungus Stain Final report  Final    Comment: (NOTE) Performed At: [facility] [address] [place], [place] [id number] Jennette Shorter MD Ey:[id number]    Fungus (Mycology) Culture PENDING  Incomplete   Fungal Source TISSUE  Final    Comment: RIGHT HIP Performed at [facility], [address], [place], [place] [address]   Aerobic/Anaerobic Culture w Gram Stain (surgical/deep wound)     Status: None (Preliminary result)   Collection Time: 04/04/24  1:31 PM   Specimen: Path Tissue  Result Value Ref Range Status   Specimen Description   Final    TISSUE Performed at [facility], [address], [place], [place] [address]    Special Requests R HIP SAMPLE D HOLD 14 DAYSQ  Final   Gram Stain   Final    FEW WBC PRESENT,BOTH PMN AND MONONUCLEAR NO ORGANISMS SEEN    Culture   Final    NO GROWTH 8 DAYS CONTINUING TO HOLD Performed at [facility], [address] [place], [place] [address]    Report Status PENDING  Incomplete   Fungus Culture Result     Status: None  Collection Time: 04/04/24  1:31 PM  Result Value Ref Range Status   Result 1 Comment  Final    Comment: (NOTE) KOH/Calcofluor preparation:  no fungus observed. Performed At: [facility] [address] [place], [place] [id number] Jennette Shorter MD Ey:[id number]          Radiology Studies: No results found.       Scheduled Meds:  buPROPion   150 mg Oral Daily   carvedilol   6.25 mg Oral BID WC   Chlorhexidine  Gluconate Cloth  6 each Topical Daily   docusate sodium   100 mg Oral BID   enoxaparin   40 mg Subcutaneous Q24H   feeding supplement  237 mL Oral BID BM   hydrochlorothiazide   12.5 mg Oral Daily   And   losartan   100 mg Oral Daily   lisdexamfetamine  50 mg Oral Daily   pantoprazole   40 mg Oral Daily   rosuvastatin   10 mg Oral Daily   senna  1 tablet Oral BID   sodium chloride  flush  10-40 mL Intracatheter Q12H   Continuous Infusions:  DAPTOmycin  700 mg (04/12/24 1333)     LOS: 9 days   Time spent:  Elsie JAYSON [place], DO [facility]  If 7PM-7AM, please contact night-coverage [email or web address]  04/13/2024, 8:11 AM

## 2024-04-13 NOTE — TOC Progression Note (Signed)
 Transition of Care [facility]) - Progression Note    Patient Details  Name: Ashley [name] MRN: [id number] Date of Birth: May 10, 1976  [facility] CM/SW Contact  Sonda Manuella Quill, RN Phone Number: 04/13/2024, 11:37 AM  Clinical Narrative:    JANAS updated w/ PAS[facility] # [id number] E; awaiting bed offers.     Barriers to Discharge: Continued Medical Work up               Expected Discharge Plan and Services                         DME Arranged: Walker rolling DME Agency: [employer]       HH Arranged: IV Antibiotics HH Agency: [facility] Date HH Agency Contacted: 04/06/24 Time HH Agency Contacted: 1200 Representative spoke with at [facility] Agency: Holley Herring   Social Drivers of Health (SDOH) Interventions SDOH Screenings   Food Insecurity: Patient Declined (04/04/2024)  Housing: Unknown (04/04/2024)  Transportation Needs: Patient Declined (04/04/2024)  Utilities: Patient Declined (04/04/2024)  Depression (PHQ2-9): Low Risk  (07/08/2020)  Tobacco Use: Low Risk  (04/04/2024)    Readmission Risk Interventions    04/05/2024    1:25 PM  Readmission Risk Prevention Plan  Post Dischage Appt Complete  Medication Screening Complete  Transportation Screening Complete

## 2024-04-13 NOTE — Progress Notes (Signed)
 Physical Therapy Treatment Patient Details Name: Ashley [name] MRN: [id number] DOB: [birth year] Today's Date: 04/13/2024   History of Present Illness Ashley [name] is a 48 y.o. F admitted 04/04/2024 for a planned explant of right hip hardware with antibiotic spacer placement for chronic prosthetic joint infection. PMH:  HTN, depression/anxiety, s/p sleeve gastrectomy, s/p right THA 2017, chronic pain syndrome    PT Comments  The patient is more verbal, family visiting. Patient received on BSC(had been assisted by CNA by standing from recliner and BSC brought up to pt.) Patient assisted to stand holding onto  Rw  with LUE and BSC for pericare. Patient  then held RW bilaterally. Pt. Unable to steps, +3 to assist to turn to sit  down to bed.  Continue PT  for mobility.    If plan is discharge home, recommend the following: Two people to help with walking and/or transfers;Two people to help with bathing/dressing/bathroom;Assistance with cooking/housework;Assist for transportation;Help with stairs or ramp for entrance   Can travel by private vehicle     No  Equipment Recommendations  Rolling walker (2 wheels)    Recommendations for Other Services       Precautions / Restrictions Precautions Precautions: Posterior Hip Recall of Precautions/Restrictions: Impaired Restrictions Weight Bearing Restrictions Per Provider Order: Yes RLE Weight Bearing Per Provider Order: Partial weight bearing RLE Partial Weight Bearing Percentage or Pounds: 20% Other Position/Activity Restrictions: posterior hip     Mobility  Bed Mobility   Bed Mobility: Sit to Supine     Supine to sit: Total assist, +2 for physical assistance, +2 for safety/equipment     General bed mobility comments: assist lifting both legs, assist trunk    Transfers Overall transfer level: Needs assistance Equipment used: Rolling walker (2 wheels) Transfers: Sit to/from Stand, Bed to chair/wheelchair/BSC Sit to Stand: Max  assist, +2 safety/equipment, +2 physical assistance (+3) Stand pivot transfers: Max assist (+3)        Lateral/Scoot Transfers: Total assist, +2 physical assistance, +2 safety/equipment, With slide board, From elevated surface General transfer comment: max to stand from [facility], stood x 1 min for periicare, unable to take a step, assisted to pivot to land onto bed to sit with +3    Ambulation/Gait                   Stairs             Wheelchair Mobility     Tilt Bed    Modified Rankin (Stroke Patients Only)       Balance Overall balance assessment: Needs assistance Sitting-balance support: Single extremity supported, Bilateral upper extremity supported Sitting balance-Leahy Scale: Fair Sitting balance - Comments: Pt sitting with CGA only and cues for wt shift to maintain balance   Standing balance support: Bilateral upper extremity supported, During functional activity, Reliant on assistive device for balance Standing balance-Leahy Scale: Poor                              Communication Communication Communication: No apparent difficulties Factors Affecting Communication: Reduced clarity of speech  Cognition Arousal: Alert Behavior During Therapy: Flat affect   PT - Cognitive impairments: No apparent impairments Difficult to assess due to: Impaired communication                     PT - Cognition Comments: awake and answered yes or no , more bverbal with family present. Following  commands: Impaired Following commands impaired: Only follows one step commands consistently    Cueing Cueing Techniques: Verbal cues, Gestural cues, Tactile cues  Exercises General Exercises - Lower Extremity Long Arc Quad: AAROM, 10 reps, Right, Seated    General Comments        Pertinent Vitals/Pain Pain Assessment Pain Assessment: Faces Pain Score: 10-Worst pain ever Faces Pain Scale: Hurts worst Pain Location: R hip Pain Descriptors /  Indicators: Crying, Grimacing, Guarding, Moaning, Operative site guarding Pain Intervention(s): Monitored during session, Patient requesting pain meds-RN notified    Home Living                          Prior Function            PT Goals (current goals can now be found in the care plan section) Progress towards PT goals: Progressing toward goals    Frequency    7X/week      PT Plan      Co-evaluation              AM-PAC PT 6 Clicks Mobility   Outcome Measure  Help needed turning from your back to your side while in a flat bed without using bedrails?: Total Help needed moving from lying on your back to sitting on the side of a flat bed without using bedrails?: Total Help needed moving to and from a bed to a chair (including a wheelchair)?: Total Help needed standing up from a chair using your arms (e.g., wheelchair or bedside chair)?: Total Help needed to walk in hospital room?: Total Help needed climbing 3-5 steps with a railing? : Total 6 Click Score: 6    End of Session Equipment Utilized During Treatment: Gait belt Activity Tolerance: Patient limited by pain Patient left: in bed;with nursing/sitter in room Nurse Communication: Mobility status;Need for lift equipment PT Visit Diagnosis: Difficulty in walking, not elsewhere classified (R26.2);Pain;Muscle weakness (generalized) (M62.81);Other abnormalities of gait and mobility (R26.89) Pain - Right/Left: Right Pain - part of body: Hip     Time: 1315-1340 PT Time Calculation (min) (ACUTE ONLY): 25 min  Charges:    $Therapeutic Activity: 23-37 mins PT General Charges $$ ACUTE PT VISIT: 1 Visit                     Darice Potters PT Acute Rehabilitation Services Office [phone]    Potters Darice Norris 04/13/2024, 2:28 PM

## 2024-04-13 NOTE — NC FL2 (Signed)
 [place]  MEDICAID FL2 LEVEL OF CARE FORM     IDENTIFICATION  Patient Name: Ashley [name] Birthdate: [birth year] Sex: female Admission Date (Current Location): 04/04/2024  [facility] and [place] Number:  Producer, television/film/video and Address:  [facility],  [address] 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   Bilateral leg edema 01/09/2024   Chronic pain syndrome 01/09/2024   Hyperlipidemia 12/22/2023   Seasonal allergies 12/22/2023   Vitamin D  deficiency 11/21/2023   Prediabetes 11/03/2023   Allergic rhinitis 01/29/2022   Allergic rhinitis due to animal hair and dander 01/29/2022   Allergic rhinitis due to pollen 01/29/2022   Chronic allergic conjunctivitis 01/29/2022   Seafood allergy 01/29/2022   History of endometrial ablation 07/24/2021   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   PCOS (polycystic ovarian syndrome) 04/02/2021   Depressive disorder 04/02/2021   Generalized OA 04/02/2021   Postoperative pain 04/02/2021   Acute blood loss anemia 04/02/2021   Neuropathic pain 04/02/2021   Therapeutic opioid induced constipation 04/02/2021   Urinary retention 04/02/2021   Fall 04/02/2021   Labile blood pressure 04/02/2021   Pain 04/02/2021   Generalized anxiety disorder 04/02/2021   Obesity 04/02/2021   Body mass index (BMI) 40.0-44.9, adult (HCC) 12/31/2020   Flat foot  05/07/2019   Abnormality of gait 02/22/2019   Herpes simplex 01/18/2019   Lumbosacral radiculopathy at L4 06/09/2018   Paresis of single lower extremity (HCC) 04/26/2018   Lumbar radiculopathy 02/21/2018   Status post surgery 02/17/2018   Hip pain 01/05/2018   Radiculopathy, lumbar region 08/10/2017   Lumbar spondylosis 08/10/2017   Herniated nucleus pulposus, lumbar 05/19/2016   Primary localized osteoarthritis of right hip 04/21/2016   Sinusitis 06/25/2015   Other specified cough 06/25/2015   Essential hypertension 04/03/2015   Dehydration 04/03/2015   CAP (community acquired pneumonia) 03/31/2015   Right lower lobe pneumonia 03/31/2015   Fever 03/31/2015   Pregnancy 04/29/2014   Miscarriage    Incomplete miscarriage 04/02/2011   Deep venous thrombosis (HCC) 09/20/2010    Orientation RESPIRATION BLADDER Height & Weight     Self, Place  Normal Incontinent, External catheter (currently with purewick) Weight: 117 kg Height:  5' 11 (180.3 cm)  BEHAVIORAL SYMPTOMS/MOOD NEUROLOGICAL BOWEL NUTRITION STATUS      Incontinent Diet (regular)  AMBULATORY STATUS COMMUNICATION OF NEEDS Skin   Total Care Verbally Other (Comment) (surgical incision only)                       Personal Care Assistance Level of Assistance  Bathing, Feeding, Dressing Bathing Assistance: Maximum assistance Feeding assistance: Limited assistance Dressing Assistance: Maximum assistance     Functional Limitations Info  Sight, Hearing, Speech Sight Info: Adequate Hearing Info: Adequate Speech Info: Adequate    SPECIAL CARE FACTORS FREQUENCY  PT (By licensed PT), OT (By licensed OT)     PT Frequency: 5x/wk OT  Frequency: 5x/wk            Contractures Contractures Info: Not present    Additional Factors Info  Code Status, Allergies, Psychotropic Code Status Info: full Allergies Info: Gadolinium Derivatives, Codeine, Duloxetine Psychotropic Info: see MAR         Current Medications  (04/13/2024):  This is the current hospital active medication list Current Facility-Administered Medications  Medication Dose Route Frequency Provider Last Rate Last Admin   acetaminophen  (TYLENOL ) suppository 650 mg  650 mg Rectal Q6H PRN Patel, Vishal R, MD   650 mg at 04/06/24 1839   acetaminophen  (TYLENOL ) tablet 325-650 mg  325-650 mg Oral Q6H PRN Cockerham, Alicia M, PA-C   650 mg at 04/12/24 2051   albuterol  (PROVENTIL ) (2.5 MG/3ML) 0.083% nebulizer solution 3 mL  3 mL Nebulization Q4H PRN Cockerham, Alicia M, PA-C       buPROPion  (WELLBUTRIN  XL) 24 hr tablet 150 mg  150 mg Oral Daily Cockerham, Alicia M, PA-C   150 mg at 04/13/24 9056   carvedilol  (COREG ) tablet 6.25 mg  6.25 mg Oral BID WC Cockerham, Alicia M, PA-C   6.25 mg at 04/13/24 9056   Chlorhexidine  Gluconate Cloth 2 % PADS 6 each  6 each Topical Daily Edna Toribio LABOR, MD   6 each at 04/13/24 0944   DAPTOmycin  (CUBICIN ) IVPB 700 mg/100mL premix  700 mg Intravenous Q1400 Vu, Constance T, MD 200 mL/hr at 04/12/24 1333 700 mg at 04/12/24 1333   docusate sodium  (COLACE) capsule 100 mg  100 mg Oral BID Cockerham, Alicia M, PA-C   100 mg at 04/13/24 9056   enoxaparin  (LOVENOX ) injection 40 mg  40 mg Subcutaneous Q24H Edna Toribio LABOR, MD   40 mg at 04/13/24 9056   feeding supplement (ENSURE PLUS HIGH PROTEIN) liquid 237 mL  237 mL Oral BID BM Edna Toribio LABOR, MD   237 mL at 04/10/24 9082   furosemide  (LASIX ) tablet 20 mg  20 mg Oral Daily PRN Cockerham, Alicia M, PA-C       hydrochlorothiazide  (HYDRODIURIL ) tablet 12.5 mg  12.5 mg Oral Daily Mark Bard LABOR, RPH   12.5 mg at 04/13/24 9056   And   losartan  (COZAAR ) tablet 100 mg  100 mg Oral Daily Mark Bard LABOR, RPH   100 mg at 04/13/24 9056   lidocaine  (LIDODERM ) 5 % 1-3 patch  1-3 patch Transdermal Daily PRN Cockerham, Alicia M, PA-C   2 patch at 04/08/24 2128   lisdexamfetamine (VYVANSE ) capsule 50 mg  50 mg Oral Daily Edna Toribio LABOR, MD   50 mg at 04/13/24 9056    loratadine  (CLARITIN ) tablet 10 mg  10 mg Oral Daily PRN Cockerham, Alicia M, PA-C       menthol -cetylpyridinium (CEPACOL) lozenge 3 mg  1 lozenge Oral PRN Cockerham, Alicia M, PA-C       Or   phenol (CHLORASEPTIC) mouth spray 1 spray  1 spray Mouth/Throat PRN Cockerham, Alicia M, PA-C       methocarbamol  (ROBAXIN ) tablet 500 mg  500 mg Oral Q6H PRN Cockerham, Alicia M, PA-C   500 mg at 04/13/24 9058   Or   methocarbamol  (ROBAXIN ) injection 500 mg  500 mg Intravenous Q6H PRN Cockerham, Alicia M, PA-C       naloxone  (NARCAN ) injection 0.4 mg  0.4 mg Intravenous PRN Edna Toribio LABOR, MD   0.4 mg at 04/05/24 1229   ondansetron  (ZOFRAN ) tablet 4 mg  4 mg Oral Q6H PRN Cockerham, Alicia M, PA-C  4 mg at 04/12/24 0630   Or   ondansetron  (ZOFRAN ) injection 4 mg  4 mg Intravenous Q6H PRN Cockerham, Alicia M, PA-C       oxyCODONE  (Oxy IR/ROXICODONE ) immediate release tablet 5-10 mg  5-10 mg Oral Q4H PRN Edna Toribio LABOR, MD   5 mg at 04/13/24 0941   pantoprazole  (PROTONIX ) EC tablet 40 mg  40 mg Oral Daily Cockerham, Alicia M, PA-C   40 mg at 04/13/24 0943   polyethylene glycol (MIRALAX  / GLYCOLAX ) packet 17 g  17 g Oral Daily PRN Cockerham, Alicia M, PA-C       rosuvastatin  (CRESTOR ) tablet 10 mg  10 mg Oral Daily Edna Toribio LABOR, MD   10 mg at 04/13/24 0943   senna (SENOKOT) tablet 8.6 mg  1 tablet Oral BID Cockerham, Alicia M, PA-C   8.6 mg at 04/13/24 9056   sodium chloride  flush (NS) 0.9 % injection 10-40 mL  10-40 mL Intracatheter Q12H Marchwiany, Daniel A, MD   10 mL at 04/09/24 1043   sodium chloride  flush (NS) 0.9 % injection 10-40 mL  10-40 mL Intracatheter PRN Edna Toribio LABOR, MD         Discharge Medications: Please see discharge summary for a list of discharge medications.  Relevant Imaging Results:  Relevant Lab Results:   Additional Information SS# [id number]; please note pt with PICC and plan for ongoing abx  Sonda, Manuella Quill, RN

## 2024-04-14 LAB — COMPREHENSIVE METABOLIC PANEL WITH GFR
ALT: 20 U/L (ref 0–44)
AST: 22 U/L (ref 15–41)
Albumin: 2.8 g/dL — ABNORMAL LOW (ref 3.5–5.0)
Alkaline Phosphatase: 56 U/L (ref 38–126)
Anion gap: 9 (ref 5–15)
BUN: 15 mg/dL (ref 6–20)
CO2: 27 mmol/L (ref 22–32)
Calcium: 8.5 mg/dL — ABNORMAL LOW (ref 8.9–10.3)
Chloride: 102 mmol/L (ref 98–111)
Creatinine, Ser: 0.79 mg/dL (ref 0.44–1.00)
GFR, Estimated: >60 mL/min (ref >60)
Glucose, Bld: 94 mg/dL (ref 70–99)
Potassium: 3.4 mmol/L — ABNORMAL LOW (ref 3.5–5.1)
Sodium: 138 mmol/L (ref 135–145)
Total Bilirubin: 0.7 mg/dL (ref 0.0–1.2)
Total Protein: 6.4 g/dL — ABNORMAL LOW (ref 6.5–8.1)

## 2024-04-14 LAB — CBC
HCT: 28.7 % — ABNORMAL LOW (ref 36.0–46.0)
Hemoglobin: 8.7 g/dL — ABNORMAL LOW (ref 12.0–15.0)
MCH: 25.1 pg — ABNORMAL LOW (ref 26.0–34.0)
MCHC: 30.3 g/dL (ref 30.0–36.0)
MCV: 82.7 fL (ref 80.0–100.0)
Platelets: 304 K/uL (ref 150–400)
RBC: 3.47 MIL/uL — ABNORMAL LOW (ref 3.87–5.11)
RDW: 16 % — ABNORMAL HIGH (ref 11.5–15.5)
WBC: 8.8 K/uL (ref 4.0–10.5)
nRBC: 0 % (ref 0.0–0.2)

## 2024-04-14 NOTE — Progress Notes (Signed)
 Physical Therapy Treatment Patient Details Name: Ashley [name] MRN: [id number] DOB: 1975-10-28 Today's Date: 04/14/2024   History of Present Illness Ashley [name] is a 48 y.o. F admitted 04/04/2024 for a planned explant of right hip hardware with antibiotic spacer placement for chronic prosthetic joint infection. PMH:  HTN, depression/anxiety, s/p sleeve gastrectomy, s/p right THA 2017, chronic pain syndrome    PT Comments  Patient Assisted with +2 max support to sitting then to standing at  platform RW by raising bed. Patient  stood ~ 2 minutes, unable to take a step, reporting feeling funny in the head. Assisted back to supine with +2 total assistance. Patient will benefit from continued inpatient follow up therapy, <3 hours/day.     If plan is discharge home, recommend the following: Two people to help with walking and/or transfers;Two people to help with bathing/dressing/bathroom;Assistance with cooking/housework;Assist for transportation;Help with stairs or ramp for entrance   Can travel by private vehicle        Equipment Recommendations  Rolling walker (2 wheels)    Recommendations for Other Services       Precautions / Restrictions Precautions Precautions: Posterior Hip Recall of Precautions/Restrictions: Impaired Restrictions RLE Weight Bearing Per Provider Order: Partial weight bearing RLE Partial Weight Bearing Percentage or Pounds: 20% Other Position/Activity Restrictions: posterior hip     Mobility  Bed Mobility Overal bed mobility: Needs Assistance Bed Mobility: Supine to Sit, Sit to Supine     Supine to sit: +2 for physical assistance, +2 for safety/equipment, Mod assist Sit to supine: Total assist, +2 for safety/equipment, +2 for physical assistance   General bed mobility comments: patient self assisting RLE with belt, bed pad to scoot to bed edge, totoal to return to suine with bed pad and lifting both legs    Transfers Overall transfer level: Needs  assistance Equipment used: Bilateral platform walker Transfers: Sit to/from Stand Sit to Stand: Max assist, +2 safety/equipment, +2 physical assistance           General transfer comment: EB+VA RW in front, bed raised, max support to power up to standing, sotood about 1 minute. reported feeling funny in head. BP stable. Assisted back to supine.    Ambulation/Gait                   Stairs             Wheelchair Mobility     Tilt Bed    Modified Rankin (Stroke Patients Only)       Balance Overall balance assessment: Needs assistance Sitting-balance support: Single extremity supported, Bilateral upper extremity supported Sitting balance-Leahy Scale: Fair Sitting balance - Comments: Pt sitting with CGA only and cues for wt shift to maintain balance   Standing balance support: Bilateral upper extremity supported, During functional activity, Reliant on assistive device for balance Standing balance-Leahy Scale: Poor                              Communication Communication Communication: No apparent difficulties Factors Affecting Communication: Reduced clarity of speech  Cognition Arousal: Alert Behavior During Therapy: Flat affect                           PT - Cognition Comments: , more verbal with family present. Following commands: Intact      Cueing Cueing Techniques: Verbal cues, Gestural cues, Tactile cues  Exercises  General Comments        Pertinent Vitals/Pain Pain Assessment Pain Score: 10-Worst pain ever Pain Descriptors / Indicators: Crying, Grimacing, Guarding, Moaning, Operative site guarding Pain Intervention(s): Limited activity within patient's tolerance, Monitored during session, Premedicated before session, Repositioned    Home Living                          Prior Function            PT Goals (current goals can now be found in the care plan section) Progress towards PT goals:  Progressing toward goals    Frequency    7X/week      PT Plan      Co-evaluation              AM-PAC PT 6 Clicks Mobility   Outcome Measure  Help needed turning from your back to your side while in a flat bed without using bedrails?: Total Help needed moving from lying on your back to sitting on the side of a flat bed without using bedrails?: Total Help needed moving to and from a bed to a chair (including a wheelchair)?: Total Help needed standing up from a chair using your arms (e.g., wheelchair or bedside chair)?: Total Help needed to walk in hospital room?: Total Help needed climbing 3-5 steps with a railing? : Total 6 Click Score: 6    End of Session Equipment Utilized During Treatment: Gait belt Activity Tolerance: Patient tolerated treatment well;Patient limited by pain;Treatment limited secondary to medical complications (Comment) Patient left: in bed;with family/visitor present;with call bell/phone within reach Nurse Communication: Mobility status;Need for lift equipment PT Visit Diagnosis: Difficulty in walking, not elsewhere classified (R26.2);Pain;Muscle weakness (generalized) (M62.81);Other abnormalities of gait and mobility (R26.89) Pain - Right/Left: Right Pain - part of body: Hip     Time: 9041-8958 PT Time Calculation (min) (ACUTE ONLY): 43 min  Charges:    $Therapeutic Activity: 38-52 mins PT General Charges $$ ACUTE PT VISIT: 1 Visit                     Darice Potters PT Acute Rehabilitation Services Office [phone]    Potters Darice Norris 04/14/2024, 5:10 PM

## 2024-04-14 NOTE — Progress Notes (Signed)
 Subjective: Patient reports pain as moderate. Still very drowsy. Tolerating diet.  Urinating.   No CP, SOB.  Difficulty progressing with mobilization OOB with PT/OT.  Objective:   VITALS:   Vitals:   04/13/24 0529 04/13/24 1351 04/13/24 2210 04/14/24 0539  BP: 118/74 97/64 107/73 113/62  Pulse: 77 88 79 78  Resp: 16 14 16 15   Temp: 98.8 F (37.1 C) 98.1 F (36.7 C) 98 F (36.7 C) 98.1 F (36.7 C)  TempSrc:   Oral Oral  SpO2: 98% 100% 100% 100%  Weight:      Height:          Latest Ref Rng & Units 04/14/2024    3:09 AM 04/11/2024   11:28 AM 04/08/2024    3:25 AM  CBC  WBC 4.0 - 10.5 K/uL 8.8  9.5  7.5   Hemoglobin 12.0 - 15.0 g/dL 8.7  9.6  8.7   Hematocrit 36.0 - 46.0 % 28.7  30.4  28.7   Platelets 150 - 400 K/uL 304  259  116       Latest Ref Rng & Units 04/14/2024    3:09 AM 04/11/2024   11:28 AM 04/08/2024    3:25 AM  BMP  Glucose 70 - 99 mg/dL 94  847  89   BUN 6 - 20 mg/dL 15  9  11    Creatinine 0.44 - 1.00 mg/dL 9.20  9.24  9.30   Sodium 135 - 145 mmol/L 138  138  139   Potassium 3.5 - 5.1 mmol/L 3.4  3.4  3.5   Chloride 98 - 111 mmol/L 102  104  107   CO2 22 - 32 mmol/L 27  26  22    Calcium  8.9 - 10.3 mg/dL 8.5  8.3  7.8    Intake/Output      07/25 0701 07/26 0700 07/26 0701 07/27 0700   P.O.     I.V. (mL/kg)     IV Piggyback 100    Total Intake(mL/kg) 100 (0.9)    Urine (mL/kg/hr) 250 (0.1)    Stool     Total Output 250    Net -150         Urine Occurrence 1 x    Stool Occurrence 1 x       Physical Exam: General: NAD.  Sitting up in bed, calm, drowsy, responds to questions Resp: No increased wob Cardio: regular rate and rhythm ABD soft Neurologically intact MSK Neurovascularly intact Sensation intact distally Intact pulses distally Dorsiflexion/Plantar flexion intact Incision: dressing C/D/I   Assessment: 10 Days Post-Op  S/P Procedure(s) (LRB): REVISION, ARTHROPLASTY, HIP (Right) by Dr. Edna on 04/04/24  Principal  Problem:   Failed total hip arthroplasty (HCC) Active Problems:   Essential hypertension   Depressive disorder   Chronic pain syndrome   Acute encephalopathy   Thrombocytopenia (HCC)   Normocytic anemia   Delirium   Plan: 1 out of the 4 cultures growing rare gram + cocci Continue IV ABX Up with therapy Incentive Spirometry Elevate and Apply ice  Weightbearing: PWB 20% RLE with posterior hip precautions Insicional and dressing care: Dressings left intact until follow-up and Reinforce dressings as needed Orthopedic device(s): None Showering: Keep dressing dry VTE prophylaxis: Lovenox  40mg  qd despite note from surgeon stating Eliquis  2.5mg , SCDs, ambulation Pain control: PRN, limit narcotics as able due to delirium Follow - up plan: with Dr. Edna in 7 days Contact information for today:  Luane Rochon PA-C  Dispo: Skilled Nursing Facility/Rehab  once bed available and medically stable.     Gerard CHRISTELLA Large, PA-C Office [phone] 04/14/2024, 12:51 PM

## 2024-04-15 NOTE — TOC Progression Note (Signed)
 Transition of Care [facility]) - Progression Note    Patient Details  Name: Ashley [name] MRN: [id number] Date of Birth: 1975-11-19  Transition of Care [facility]) CM/SW Contact  Sheri ONEIDA Sharps, LCSW Phone Number: 04/15/2024, 5:05 PM  Clinical Narrative:    Bed offers presented; pt family planning to visit facilities. Bed choice pending.     Barriers to Discharge: Continued Medical Work up               Expected Discharge Plan and Services                         DME Arranged: Walker rolling DME Agency: [employer]       HH Arranged: IV Antibiotics HH Agency: [facility] Date HH Agency Contacted: 04/06/24 Time HH Agency Contacted: 1200 Representative spoke with at [facility] Agency: Holley Herring   Social Drivers of Health (SDOH) Interventions SDOH Screenings   Food Insecurity: Patient Declined (04/04/2024)  Housing: Unknown (04/04/2024)  Transportation Needs: Patient Declined (04/04/2024)  Utilities: Patient Declined (04/04/2024)  Depression (PHQ2-9): Low Risk  (07/08/2020)  Tobacco Use: Low Risk  (04/04/2024)    Readmission Risk Interventions    04/05/2024    1:25 PM  Readmission Risk Prevention Plan  Post Dischage Appt Complete  Medication Screening Complete  Transportation Screening Complete

## 2024-04-15 NOTE — Plan of Care (Signed)
  Problem: Pain Managment: Goal: General experience of comfort will improve and/or be controlled Outcome: Progressing   Problem: Safety: Goal: Ability to remain free from injury will improve Outcome: Progressing

## 2024-04-15 NOTE — Progress Notes (Signed)
 Subjective: Patient reports pain as moderate. Not as drowsy today but still feels fatigued. Tolerating diet.  Urinating.   No CP, SOB.  Difficulty progressing with mobilization OOB with PT/OT due to low energy.  Objective:   VITALS:   Vitals:   04/14/24 1415 04/14/24 2048 04/15/24 0512 04/15/24 1311  BP: 120/76 117/72 117/64 98/63  Pulse: 92 89 75 93  Resp: 15 17 17 17   Temp: 97.8 F (36.6 C) 98.2 F (36.8 C) 98 F (36.7 C) 97.8 F (36.6 C)  TempSrc: Oral Oral Oral Oral  SpO2: 100% 100% 99% 100%  Weight:      Height:          Latest Ref Rng & Units 04/14/2024    3:09 AM 04/11/2024   11:28 AM 04/08/2024    3:25 AM  CBC  WBC 4.0 - 10.5 K/uL 8.8  9.5  7.5   Hemoglobin 12.0 - 15.0 g/dL 8.7  9.6  8.7   Hematocrit 36.0 - 46.0 % 28.7  30.4  28.7   Platelets 150 - 400 K/uL 304  259  116       Latest Ref Rng & Units 04/14/2024    3:09 AM 04/11/2024   11:28 AM 04/08/2024    3:25 AM  BMP  Glucose 70 - 99 mg/dL 94  847  89   BUN 6 - 20 mg/dL 15  9  11    Creatinine 0.44 - 1.00 mg/dL 9.20  9.24  9.30   Sodium 135 - 145 mmol/L 138  138  139   Potassium 3.5 - 5.1 mmol/L 3.4  3.4  3.5   Chloride 98 - 111 mmol/L 102  104  107   CO2 22 - 32 mmol/L 27  26  22    Calcium  8.9 - 10.3 mg/dL 8.5  8.3  7.8    Intake/Output      07/26 0701 07/27 0700 07/27 0701 07/28 0700   P.O. 240 120   IV Piggyback 100    Total Intake(mL/kg) 340 (2.9) 120 (1)   Urine (mL/kg/hr)     Total Output     Net +340 +120        Urine Occurrence 2 x 1 x   Stool Occurrence 0 x 0 x   Emesis Occurrence  0 x      Physical Exam: General: NAD.  Sitting up in bed, calm, responds to questions better Resp: No increased wob Cardio: regular rate and rhythm ABD soft Neurologically intact MSK Neurovascularly intact Sensation intact distally Intact pulses distally Dorsiflexion/Plantar flexion intact Incision: dressing C/D/I   Assessment: 11 Days Post-Op  S/P Procedure(s) (LRB): REVISION, ARTHROPLASTY,  HIP (Right) by Dr. Edna on 04/04/24  Principal Problem:   Failed total hip arthroplasty (HCC) Active Problems:   Essential hypertension   Depressive disorder   Chronic pain syndrome   Acute encephalopathy   Thrombocytopenia (HCC)   Normocytic anemia   Delirium   Plan: 1 out of the 4 cultures growing rare gram + cocci Continue IV ABX Up with therapy Incentive Spirometry Elevate and Apply ice  Weightbearing: PWB 20% RLE with posterior hip precautions Insicional and dressing care: Dressings left intact until follow-up and Reinforce dressings as needed Orthopedic device(s): None Showering: Keep dressing dry VTE prophylaxis: Lovenox  40mg  qd despite note from surgeon stating Eliquis  2.5mg , SCDs, ambulation Pain control: PRN, limit narcotics as able due to delirium Follow - up plan: with Dr. Edna in 7 days Contact information for today:  Ashley [name]  Ashley Weis PA-C  Dispo: Skilled Nursing Facility/Rehab once bed available and medically stable.     Gerard CHRISTELLA Large, PA-C Office [phone] 04/15/2024, 1:45 PM

## 2024-04-15 NOTE — Plan of Care (Signed)
  Problem: Nutrition: Goal: Adequate nutrition will be maintained Outcome: Progressing   Problem: Pain Managment: Goal: General experience of comfort will improve and/or be controlled Outcome: Progressing   Problem: Activity: Goal: Ability to tolerate increased activity will improve Outcome: Progressing

## 2024-04-15 NOTE — Progress Notes (Signed)
 PT Cancellation Note  Patient Details Name: Ashley [name] MRN: [id number] DOB: 02-19-1976   Cancelled Treatment:     PT attempted x2 - on initial attempt pt just transferred to chair with nursing using Stedy.  On second attempt, RN advises against stating they had just returned pt to bed but transfer was difficult requiring use of mechanical lift.  RN advises pt with limited motivation to participate.  Will follow.   Renad Jenniges 04/15/2024, 2:07 PM

## 2024-04-16 MED ORDER — ORAL CARE MOUTH RINSE: 15.0000 mL | OROMUCOSAL | Status: DC | PRN

## 2024-04-16 MED ORDER — APIXABAN 2.5 MG PO TABS
2.5000 mg | ORAL_TABLET | Freq: Two times a day (BID) | ORAL | Status: DC
  Administered 2024-04-16 – 2024-04-18 (×5): 2.5 mg via ORAL
  Filled 2024-04-16 (×5): qty 1

## 2024-04-16 NOTE — Progress Notes (Signed)
 Physical Therapy Treatment Patient Details Name: Ashley [name] MRN: [id number] DOB: [birth year] Today's Date: 04/16/2024   History of Present Illness Ashley [name] is a 48 y.o. F admitted 04/04/2024 for a planned explant of right hip hardware with antibiotic spacer placement for chronic prosthetic joint infection. PMH:  HTN, depression/anxiety, s/p sleeve gastrectomy, s/p right THA 2017, chronic pain syndrome    PT Comments  Much improved alertness and cognition.  Self limited by High anxiety along with pain with all movement. Assisted OOB to recliner was difficult.  General bed mobility comments: Pt required Max Assist + 2 to roll side to side to change bed pad.  Required Max/Mod Assist + 2 to transfer to EOB with 100% support for R LE.  HIGH anxiety, crying and pushing the opposite direction despite repeat instructions.  No I can't do it, I change my mind, stated Pt.  Family offering supprot and encouragement. General transfer comment: Used a long sliding board to assist from elevated bed to drop arm recliner + 2 Total Assist as Pt offered no self assist and pushing the opposite dierction. HIGH anxiety and increased c/o pain.  Rec Maxi Move LIFT back to bed.  Pad under Pt in recliner.  Positioned to comfort.  Unable to tolerate any TE's.  LPT has rec Pt will need ST Rehab at SNF to address mobility and functional decline prior to safely returning home.    If plan is discharge home, recommend the following: Two people to help with walking and/or transfers;Two people to help with bathing/dressing/bathroom;Assistance with cooking/housework;Assist for transportation;Help with stairs or ramp for entrance   Can travel by private vehicle     No  Equipment Recommendations       Recommendations for Other Services       Precautions / Restrictions Precautions Precautions: Posterior Hip Recall of Precautions/Restrictions: Impaired Precaution/Restrictions Comments: AxO x 3 much improved  cognition and level of alertness.  Limited by pain and anxiety. Restrictions Weight Bearing Restrictions Per Provider Order: Yes RLE Weight Bearing Per Provider Order: Partial weight bearing RLE Partial Weight Bearing Percentage or Pounds: 20% Other Position/Activity Restrictions: posterior hip     Mobility  Bed Mobility Overal bed mobility: Needs Assistance Bed Mobility: Rolling, Supine to Sit Rolling: Max assist, +2 for safety/equipment, +2 for physical assistance   Supine to sit: +2 for physical assistance, +2 for safety/equipment, Mod assist, Max assist     General bed mobility comments: Pt required Max Assist + 2 to roll side to side to change bed pad.  Required Max/Mod Assist + 2 to transfer to EOB with 100% support for R LE.  HIGH anxiety, crying and pushing the opposite direction despite repeat instructions.  No I can't do it, I change my mind, stated Pt.  Family offering supprot and encouragement.    Transfers Overall transfer level: Needs assistance Equipment used: Sliding board              Lateral/Scoot Transfers: Total assist, +2 physical assistance, +2 safety/equipment, With slide board, From elevated surface General transfer comment: Used a long sliding board to assist from elevated bed to drop arm recliner + 2 Total Assist as Pt offered no self assist and pushing the opposite dierction. HIGH anxiety and increased c/o pain.  Rec Maxi Move LIFT back to bed.  Pad under Pt in recliner.    Ambulation/Gait               General Gait Details: non amb   Stairs  Wheelchair Mobility     Tilt Bed    Modified Rankin (Stroke Patients Only)       Balance                                            Communication    Cognition Arousal: Alert Behavior During Therapy: WFL for tasks assessed/performed   PT - Cognitive impairments: No apparent impairments                       PT - Cognition Comments: much  improved alertness and cognition.  High anxiety along with pain with all movement. Following commands: Intact Following commands impaired: Only follows one step commands consistently    Cueing Cueing Techniques: Verbal cues  Exercises      General Comments        Pertinent Vitals/Pain Pain Assessment Pain Assessment: 0-10 Pain Score: 10-Worst pain ever Pain Location: R hip with all movement Pain Descriptors / Indicators: Crying, Grimacing, Guarding, Moaning, Operative site guarding Pain Intervention(s): Monitored during session, Premedicated before session, Repositioned    Home Living                          Prior Function            PT Goals (current goals can now be found in the care plan section) Progress towards PT goals: Progressing toward goals    Frequency    7X/week      PT Plan      Co-evaluation              AM-PAC PT 6 Clicks Mobility   Outcome Measure  Help needed turning from your back to your side while in a flat bed without using bedrails?: Total Help needed moving from lying on your back to sitting on the side of a flat bed without using bedrails?: Total Help needed moving to and from a bed to a chair (including a wheelchair)?: Total Help needed standing up from a chair using your arms (e.g., wheelchair or bedside chair)?: Total Help needed to walk in hospital room?: Total Help needed climbing 3-5 steps with a railing? : Total 6 Click Score: 6    End of Session Equipment Utilized During Treatment: Gait belt Activity Tolerance: Patient limited by pain Patient left: in chair;with call bell/phone within reach;with family/visitor present Nurse Communication: Mobility status;Need for lift equipment PT Visit Diagnosis: Difficulty in walking, not elsewhere classified (R26.2);Pain;Muscle weakness (generalized) (M62.81);Other abnormalities of gait and mobility (R26.89) Pain - Right/Left: Right Pain - part of body: Hip     Time:  1102-1127 PT Time Calculation (min) (ACUTE ONLY): 25 min  Charges:    $Therapeutic Activity: 23-37 mins PT General Charges $$ ACUTE PT VISIT: 1 Visit                    Katheryn Leap  PTA Acute  Rehabilitation Services Office M-F          [phone]

## 2024-04-16 NOTE — Plan of Care (Signed)
  Problem: Clinical Measurements: Goal: Will remain free from infection Outcome: Progressing Goal: Diagnostic test results will improve Outcome: Progressing   Problem: Activity: Goal: Risk for activity intolerance will decrease Outcome: Progressing   Problem: Nutrition: Goal: Adequate nutrition will be maintained Outcome: Progressing   Problem: Pain Managment: Goal: General experience of comfort will improve and/or be controlled Outcome: Progressing   Problem: Safety: Goal: Ability to remain free from injury will improve Outcome: Progressing   Problem: Skin Integrity: Goal: Risk for impaired skin integrity will decrease Outcome: Progressing   Problem: Activity: Goal: Ability to avoid complications of mobility impairment will improve Outcome: Progressing Goal: Ability to tolerate increased activity will improve Outcome: Progressing   Problem: Clinical Measurements: Goal: Postoperative complications will be avoided or minimized Outcome: Progressing   Problem: Pain Management: Goal: Pain level will decrease with appropriate interventions Outcome: Progressing

## 2024-04-16 NOTE — Progress Notes (Signed)
     Subjective: [name] doing okay this morning. Cognitively much better. Limited by weakness and deconditioning. Plan for discharge to rehab. Encouraged in bed and out of bed exercises and mobility. Denies dsital n/t.  Objective:   VITALS:   Vitals:   04/15/24 0512 04/15/24 1311 04/15/24 2035 04/16/24 0646  BP: 117/64 98/63 123/74 135/68  Pulse: 75 93 81 83  Resp: 17 17 16 16   Temp: 98 F (36.7 C) 97.8 F (36.6 C) 98.2 F (36.8 C) 98.2 F (36.8 C)  TempSrc: Oral Oral Oral Oral  SpO2: 99% 100% 100% 100%  Weight:      Height:        Sensation intact distally Intact pulses distally Dorsiflexion/Plantar flexion intact Incision: dressing C/D/I Compartment soft    Lab Results  Component Value Date   WBC 8.8 04/14/2024   HGB 8.7 (L) 04/14/2024   HCT 28.7 (L) 04/14/2024   MCV 82.7 04/14/2024   PLT 304 04/14/2024   BMET    Component Value Date/Time   NA 138 04/14/2024 0309   NA 139 10/21/2023 1054   K 3.4 (L) 04/14/2024 0309   CL 102 04/14/2024 0309   CO2 27 04/14/2024 0309   GLUCOSE 94 04/14/2024 0309   BUN 15 04/14/2024 0309   BUN 8 10/21/2023 1054   CREATININE 0.79 04/14/2024 0309   CALCIUM  8.5 (L) 04/14/2024 0309   CALCIUM  8.3 (L) 08/28/2010 0525   EGFR 91 10/21/2023 1054   GFRNONAA >60 04/14/2024 0309    Xray: Antibiotic spacer components in good position no adverse features  Assessment/Plan: 12 Days Post-Op   Principal Problem:   Failed total hip arthroplasty (HCC) Active Problems:   Essential hypertension   Depressive disorder   Chronic pain syndrome   Acute encephalopathy   Thrombocytopenia (HCC)   Normocytic anemia   Delirium  Status post right explant antibiotic spacer placement for chronic prosthetic joint infection  Post op recs: WB: 20% weightbearing right lower extremity, posterior hip precautions Abx: Currently advised broad-spectrum antibiotics with daptomycin  and unasyn  per infectious disease.  Recs are appreciated.  IntraOp  cultures with 1 out of 4 showing rare gram-positive cocci we will continue to follow cultures for growth. imaging: PACU pelvis Xray Dressing: dermabond and aquacell dressing DVT prophylaxis: Eliquis  2.5 mg postop day 1 Follow up: 1 week after surgery for a wound check with Dr. Edna at [facility]  Address: [address], [place], [place] [address]  Office Phone: [phone]   TORIBIO DELENA EDNA 04/16/2024, 7:36 AM   TORIBIO Edna, MD  Contact information:   [phone] 7am-5pm epic message Dr. Edna, or call office for patient follow up: [phone] After hours and holidays please check [email or web address] for group call information for [facility]

## 2024-04-16 NOTE — Plan of Care (Signed)
  Problem: Pain Managment: Goal: General experience of comfort will improve and/or be controlled Outcome: Progressing   Problem: Safety: Goal: Ability to remain free from injury will improve Outcome: Progressing

## 2024-04-16 NOTE — Progress Notes (Signed)
 Occupational Therapy Treatment Patient Details Name: Ashley [name] MRN: [id number] DOB: Feb 17, 1976 Today's Date: 04/16/2024   History of present illness Ashley [name] is a 48 y.o. F admitted 04/04/2024 for a planned explant of right hip hardware with antibiotic spacer placement for chronic prosthetic joint infection. PMH:  HTN, depression/anxiety, s/p sleeve gastrectomy, s/p right THA 2017, chronic pain syndrome   OT comments  Pt back in bed upon arrival. pt declined sitting EOB to participate in ADLs. Reviewed posterior hip precautions with pt. Educated pt on ADL A/E for home use and reports that she has AD at home but not sure where it is and verbalizes understanding of how to use AD. Pt declines ADL A/E handout. Noted that pt had been OOB earlier from PT session. OT will continue to follow acutely to maximize level of function and safety      If plan is discharge home, recommend the following:  Two people to help with walking and/or transfers;A lot of help with bathing/dressing/bathroom;Assistance with cooking/housework;Direct supervision/assist for medications management;Assist for transportation;Help with stairs or ramp for entrance;Assistance with feeding   Equipment Recommendations  Other (comment) (defer)    Recommendations for Other Services      Precautions / Restrictions Precautions Precautions: Posterior Hip Precaution Booklet Issued: Yes (comment) Recall of Precautions/Restrictions: Impaired Precaution/Restrictions Comments: AxO x 3 much improved cognition and level of alertness.  Limited by pain and anxiety. Reviewed posterior hip precautions Restrictions Weight Bearing Restrictions Per Provider Order: Yes RLE Weight Bearing Per Provider Order: Partial weight bearing RLE Partial Weight Bearing Percentage or Pounds: 20% Other Position/Activity Restrictions: posterior hip       Mobility Bed Mobility               General bed mobility comments: pt declined     Transfers                         Balance                                           ADL either performed or assessed with clinical judgement   ADL Overall ADL's : Needs assistance/impaired     Grooming: Wash/dry hands;Wash/dry face;Supervision/safety;Set up;Bed level                                 General ADL Comments: pt declined sitting EOB to participate in ADLs. Educated pt on ADL A/E for home use. Pt reports that she has AD at home but not sure where it is and verbalizes understanding of how to use AD. Pt declines ADL A/E handout. Noted that pt had been OOB earlier from PT session    Extremity/Trunk Assessment Upper Extremity Assessment Upper Extremity Assessment: Overall WFL for tasks assessed   Lower Extremity Assessment Lower Extremity Assessment: Defer to PT evaluation   Cervical / Trunk Assessment Cervical / Trunk Assessment: Other exceptions Cervical / Trunk Exceptions: hx of chronic back pain    Vision Ability to See in Adequate Light: 0 Adequate Patient Visual Report: No change from baseline     Perception     Praxis     Communication Communication Communication: No apparent difficulties   Cognition Arousal: Alert Behavior During Therapy: WFL for tasks assessed/performed, Anxious  Following commands: Intact        Cueing      Exercises      Shoulder Instructions       General Comments      Pertinent Vitals/ Pain       Pain Assessment Pain Assessment: Faces Faces Pain Scale: Hurts even more Pain Descriptors / Indicators: Aching, Discomfort, Operative site guarding Pain Intervention(s): Limited activity within patient's tolerance  Home Living                                          Prior Functioning/Environment              Frequency  Min 2X/week        Progress Toward Goals  OT Goals(current goals can now be found  in the care plan section)  Progress towards OT goals: OT to reassess next treatment     Plan      Co-evaluation                 AM-PAC OT 6 Clicks Daily Activity     Outcome Measure   Help from another person eating meals?: None Help from another person taking care of personal grooming?: A Little Help from another person toileting, which includes using toliet, bedpan, or urinal?: Total Help from another person bathing (including washing, rinsing, drying)?: A Lot Help from another person to put on and taking off regular upper body clothing?: A Lot Help from another person to put on and taking off regular lower body clothing?: Total 6 Click Score: 13    End of Session    OT Visit Diagnosis: Unsteadiness on feet (R26.81);Other abnormalities of gait and mobility (R26.89);Muscle weakness (generalized) (M62.81);Other symptoms and signs involving cognitive function;Pain;Feeding difficulties (R63.3) Pain - Right/Left: Right Pain - part of body: Hip   Activity Tolerance No increased pain   Patient Left with call bell/phone within reach;in bed   Nurse Communication          Time: 8546-8495 OT Time Calculation (min): 11 min  Charges: OT General Charges $OT Visit: 1 Visit OT Treatments $Therapeutic Activity: 8-22 mins    Jacques Karna Loose 04/16/2024, 3:17 PM

## 2024-04-17 ENCOUNTER — Inpatient Hospital Stay (HOSPITAL_COMMUNITY)

## 2024-04-17 LAB — COMPREHENSIVE METABOLIC PANEL WITH GFR
ALT: 19 U/L (ref 0–44)
AST: 19 U/L (ref 15–41)
Albumin: 2.8 g/dL — ABNORMAL LOW (ref 3.5–5.0)
Alkaline Phosphatase: 64 U/L (ref 38–126)
Anion gap: 9 (ref 5–15)
BUN: 14 mg/dL (ref 6–20)
CO2: 27 mmol/L (ref 22–32)
Calcium: 8.5 mg/dL — ABNORMAL LOW (ref 8.9–10.3)
Chloride: 104 mmol/L (ref 98–111)
Creatinine, Ser: 0.9 mg/dL (ref 0.44–1.00)
GFR, Estimated: >60 mL/min (ref >60)
Glucose, Bld: 96 mg/dL (ref 70–99)
Potassium: 3.8 mmol/L (ref 3.5–5.1)
Sodium: 140 mmol/L (ref 135–145)
Total Bilirubin: 0.6 mg/dL (ref 0.0–1.2)
Total Protein: 6.7 g/dL (ref 6.5–8.1)

## 2024-04-17 LAB — CBC
HCT: 29.2 % — ABNORMAL LOW (ref 36.0–46.0)
Hemoglobin: 9.1 g/dL — ABNORMAL LOW (ref 12.0–15.0)
MCH: 25.9 pg — ABNORMAL LOW (ref 26.0–34.0)
MCHC: 31.2 g/dL (ref 30.0–36.0)
MCV: 83.2 fL (ref 80.0–100.0)
Platelets: 317 K/uL (ref 150–400)
RBC: 3.51 MIL/uL — ABNORMAL LOW (ref 3.87–5.11)
RDW: 16.2 % — ABNORMAL HIGH (ref 11.5–15.5)
WBC: 8.3 K/uL (ref 4.0–10.5)
nRBC: 0 % (ref 0.0–0.2)

## 2024-04-17 MED ORDER — OXYCODONE HCL 5 MG PO TABS: 5.0000 mg | ORAL_TABLET | Freq: Four times a day (QID) | ORAL | 0 refills | Status: AC | PRN

## 2024-04-17 NOTE — Progress Notes (Signed)
 Physical Therapy Treatment Patient Details Name: Ashley [name] MRN: [id number] DOB: 1976-04-28 Today's Date: 04/17/2024   History of Present Illness Ashley [name] is a 48 y.o. F admitted 04/04/2024 for a planned explant of right hip hardware with antibiotic spacer placement for chronic prosthetic joint infection. PMH:  HTN, depression/anxiety, s/p sleeve gastrectomy, s/p right THA 2017, chronic pain syndrome    PT Comments  Pt making progress today.  Improved effort and assist from pt but still limited by pain.  Needing increased time and cues for transfer techniques.  Does still need assist of 2 for bed mobility but mod A x 2 instead of total.  Able to pivot to chair with 4 good pivots/steps with RW and assist.  Cont POC. Patient will benefit from continued inpatient follow up therapy, <3 hours/day     If plan is discharge home, recommend the following: Two people to help with walking and/or transfers;Two people to help with bathing/dressing/bathroom;Assistance with cooking/housework;Assist for transportation;Help with stairs or ramp for entrance   Can travel by private vehicle     No  Equipment Recommendations  Rolling walker (2 wheels)    Recommendations for Other Services       Precautions / Restrictions Precautions Precautions: Posterior Hip Restrictions RLE Weight Bearing Per Provider Order: Partial weight bearing RLE Partial Weight Bearing Percentage or Pounds: 20%     Mobility  Bed Mobility Overal bed mobility: Needs Assistance Bed Mobility: Rolling, Sidelying to Sit Rolling: Mod assist, +2 for safety/equipment Sidelying to sit: Mod assist, +2 for physical assistance       General bed mobility comments: partial roll R side and pushing up to sit; required cues and assist for R LE but good effort pushing up and moved L leg.  Cues to scoot forward to EOB - doing so with min A and increased time    Transfers Overall transfer level: Needs assistance Equipment used:  Rolling walker (2 wheels) Transfers: Sit to/from Stand Sit to Stand: Mod assist, From elevated surface, +2 physical assistance Stand pivot transfers: Min assist, +2 physical assistance         General transfer comment: Pt standing from elevated bed with cues and mod A x 2 to rise with increased time.  Pivoted to chair on L side with increased time and cues to push through arms and pivot L LE.  Pt able to do 4 good pivot/steps on L LE with increased time and assist, good use of UE to maintain 20% PWB on R    Ambulation/Gait                   Stairs             Wheelchair Mobility     Tilt Bed    Modified Rankin (Stroke Patients Only)       Balance Overall balance assessment: Needs assistance Sitting-balance support: No upper extremity supported Sitting balance-Leahy Scale: Fair     Standing balance support: Bilateral upper extremity supported, During functional activity, Reliant on assistive device for balance Standing balance-Leahy Scale: Poor Standing balance comment: Steady with RW during pivot                            Communication    Cognition Arousal: Alert Behavior During Therapy: Anxious   PT - Cognitive impairments: No apparent impairments  PT - Cognition Comments: alert and oriented, follows commands, anxious in regards to pain but putting forth good effort        Cueing    Exercises Total Joint Exercises Ankle Circles/Pumps: AROM, Both, 10 reps, Supine Quad Sets: AROM, Both, 10 reps, Supine Heel Slides: AROM, Left, AAROM, Right, 5 reps, Supine Other Exercises Other Exercises: Performed gentle ROM prior to transfers.  Encouraged ankle pumps and SAQ/quad sets on her own in chair    General Comments        Pertinent Vitals/Pain Pain Assessment Pain Assessment: 0-10 Pain Score: 6  (6/10 pre and post; 10/10 movement) Pain Location: R hip with all movement Pain Descriptors / Indicators:  Aching, Discomfort, Operative site guarding Pain Intervention(s): Limited activity within patient's tolerance, Monitored during session, Premedicated before session, Repositioned, Ice applied    Home Living                          Prior Function            PT Goals (current goals can now be found in the care plan section) Progress towards PT goals: Progressing toward goals    Frequency    7X/week      PT Plan      Co-evaluation              AM-PAC PT 6 Clicks Mobility   Outcome Measure  Help needed turning from your back to your side while in a flat bed without using bedrails?: A Lot Help needed moving from lying on your back to sitting on the side of a flat bed without using bedrails?: Total Help needed moving to and from a bed to a chair (including a wheelchair)?: A Lot Help needed standing up from a chair using your arms (e.g., wheelchair or bedside chair)?: Total Help needed to walk in hospital room?: Total Help needed climbing 3-5 steps with a railing? : Total 6 Click Score: 8    End of Session Equipment Utilized During Treatment: Gait belt Activity Tolerance: Patient limited by pain Patient left: in chair;with call bell/phone within reach;with family/visitor present Nurse Communication: Mobility status PT Visit Diagnosis: Difficulty in walking, not elsewhere classified (R26.2);Pain;Muscle weakness (generalized) (M62.81);Other abnormalities of gait and mobility (R26.89) Pain - Right/Left: Right Pain - part of body: Hip     Time: 8944-8879 PT Time Calculation (min) (ACUTE ONLY): 25 min  Charges:    $Therapeutic Exercise: 8-22 mins $Therapeutic Activity: 8-22 mins PT General Charges $$ ACUTE PT VISIT: 1 Visit                     Benjiman, PT Acute Rehab [facility] [phone]    Benjiman VEAR Mulberry 04/17/2024, 11:38 AM

## 2024-04-17 NOTE — Plan of Care (Signed)
   Problem: Clinical Measurements: Goal: Will remain free from infection Outcome: Progressing Goal: Diagnostic test results will improve Outcome: Progressing   Problem: Activity: Goal: Risk for activity intolerance will decrease Outcome: Progressing

## 2024-04-17 NOTE — TOC Progression Note (Addendum)
 Transition of Care [facility]) - Progression Note    Patient Details  Name: Ashley [name] MRN: [id number] Date of Birth: 1976/06/03  Transition of [facility]) CM/SW Contact  NORMAN ASPEN, LCSW Phone Number: 04/17/2024, 12:56 PM  Clinical Narrative:     ADDENDUM 1548: Pt/ family have accepted bed at [facility] and have received insurance authorization (auth# [id number]).  Plan for admission to facility tomorrow.   Following up with pt/ mother/ sister today about SNF facility choice.  Pt states her family is making decision.  Mother and sister note they have not yet make choice  Explained to both that we need to have decision today in order to begin insurance authorization and allow SNF time to make sure abx are ordered and available.  They are asking for a few more hours to research facilities and will follow up with me this afternoon.    Barriers to Discharge: Continued Medical Work up               Expected Discharge Plan and Services                         DME Arranged: Walker rolling DME Agency: [employer]       HH Arranged: IV Antibiotics HH Agency: [facility] Date HH Agency Contacted: 04/06/24 Time HH Agency Contacted: 1200 Representative spoke with at [facility] Agency: Holley Herring   Social Drivers of Health (SDOH) Interventions SDOH Screenings   Food Insecurity: Patient Declined (04/04/2024)  Housing: Unknown (04/04/2024)  Transportation Needs: Patient Declined (04/04/2024)  Utilities: Patient Declined (04/04/2024)  Depression (PHQ2-9): Low Risk  (07/08/2020)  Tobacco Use: Low Risk  (04/04/2024)    Readmission Risk Interventions    04/05/2024    1:25 PM  Readmission Risk Prevention Plan  Post Dischage Appt Complete  Medication Screening Complete  Transportation Screening Complete

## 2024-04-17 NOTE — Progress Notes (Signed)
 Patient awake in bed, denies pain at this time, no needs currently.

## 2024-04-17 NOTE — Progress Notes (Signed)
     Subjective: Ms. Toutant doing okay this morning.  She is more more physical therapy.  She is deconditioned at this point.  Anticipate discharge to rehab for additional therapy.  Denies distal numbness and tingling.  Encouraged increased protein intake.  Objective:   VITALS:   Vitals:   04/16/24 0646 04/16/24 1339 04/16/24 2046 04/17/24 0600  BP: 135/68 108/63 118/72 116/77  Pulse: 83 86 80 72  Resp: 16 20 15 14   Temp: 98.2 F (36.8 C) 98.4 F (36.9 C) 98.4 F (36.9 C) 97.8 F (36.6 C)  TempSrc: Oral Oral Oral   SpO2: 100% 100% 97% 98%  Weight:      Height:        Sensation intact distally Intact pulses distally Dorsiflexion/Plantar flexion intact Incision: dressing C/D/I Compartment soft    Lab Results  Component Value Date   WBC 8.3 04/17/2024   HGB 9.1 (L) 04/17/2024   HCT 29.2 (L) 04/17/2024   MCV 83.2 04/17/2024   PLT 317 04/17/2024   BMET    Component Value Date/Time   NA 140 04/17/2024 0312   NA 139 10/21/2023 1054   K 3.8 04/17/2024 0312   CL 104 04/17/2024 0312   CO2 27 04/17/2024 0312   GLUCOSE 96 04/17/2024 0312   BUN 14 04/17/2024 0312   BUN 8 10/21/2023 1054   CREATININE 0.90 04/17/2024 0312   CALCIUM  8.5 (L) 04/17/2024 0312   CALCIUM  8.3 (L) 08/28/2010 0525   EGFR 91 10/21/2023 1054   GFRNONAA >60 04/17/2024 0312    Xray: Antibiotic spacer components in good position no adverse features  Assessment/Plan: 13 Days Post-Op   Principal Problem:   Failed total hip arthroplasty (HCC) Active Problems:   Essential hypertension   Depressive disorder   Chronic pain syndrome   Acute encephalopathy   Thrombocytopenia (HCC)   Normocytic anemia   Delirium  Status post right explant antibiotic spacer placement for chronic prosthetic joint infection  Post op recs: WB: 20% weightbearing right lower extremity, posterior hip precautions Abx: Currently advised broad-spectrum antibiotics with daptomycin  and unasyn  per infectious disease.   Recs are appreciated.  IntraOp cultures with 1 out of 4 showing rare gram-positive cocci we will continue to follow cultures for growth. imaging: PACU pelvis Xray Dressing: dermabond and aquacell dressing DVT prophylaxis: Eliquis  2.5 mg postop day 1 Follow up: 1 week after surgery for a wound check with Dr. Edna at [facility].  Address: [address], [place], [place] [address]  Office Phone: [phone]   TORIBIO DELENA EDNA 04/17/2024, 6:41 AM   TORIBIO Edna, MD  Contact information:   [phone] 7am-5pm epic message Dr. Edna, or call office for patient follow up: [phone] After hours and holidays please check [email or web address] for group call information for [facility]

## 2024-04-18 DIAGNOSIS — K9189 Other postprocedural complications and disorders of digestive system: Secondary | ICD-10-CM | POA: Diagnosis not present

## 2024-04-18 DIAGNOSIS — R2240 Localized swelling, mass and lump, unspecified lower limb: Secondary | ICD-10-CM | POA: Diagnosis not present

## 2024-04-18 DIAGNOSIS — T8451XA Infection and inflammatory reaction due to internal right hip prosthesis, initial encounter: Secondary | ICD-10-CM | POA: Diagnosis not present

## 2024-04-18 DIAGNOSIS — T8451XD Infection and inflammatory reaction due to internal right hip prosthesis, subsequent encounter: Secondary | ICD-10-CM | POA: Diagnosis not present

## 2024-04-18 DIAGNOSIS — R7303 Prediabetes: Secondary | ICD-10-CM | POA: Diagnosis not present

## 2024-04-18 DIAGNOSIS — G9340 Encephalopathy, unspecified: Secondary | ICD-10-CM | POA: Diagnosis not present

## 2024-04-18 DIAGNOSIS — Z471 Aftercare following joint replacement surgery: Secondary | ICD-10-CM | POA: Diagnosis not present

## 2024-04-18 DIAGNOSIS — G709 Myoneural disorder, unspecified: Secondary | ICD-10-CM | POA: Diagnosis not present

## 2024-04-18 DIAGNOSIS — M47896 Other spondylosis, lumbar region: Secondary | ICD-10-CM | POA: Diagnosis not present

## 2024-04-18 DIAGNOSIS — G5601 Carpal tunnel syndrome, right upper limb: Secondary | ICD-10-CM | POA: Diagnosis not present

## 2024-04-18 DIAGNOSIS — I10 Essential (primary) hypertension: Secondary | ICD-10-CM | POA: Diagnosis not present

## 2024-04-18 DIAGNOSIS — R262 Difficulty in walking, not elsewhere classified: Secondary | ICD-10-CM | POA: Diagnosis not present

## 2024-04-18 DIAGNOSIS — Z7401 Bed confinement status: Secondary | ICD-10-CM | POA: Diagnosis not present

## 2024-04-18 DIAGNOSIS — T84018D Broken internal joint prosthesis, other site, subsequent encounter: Secondary | ICD-10-CM | POA: Diagnosis not present

## 2024-04-18 DIAGNOSIS — M6281 Muscle weakness (generalized): Secondary | ICD-10-CM | POA: Diagnosis not present

## 2024-04-18 DIAGNOSIS — M179 Osteoarthritis of knee, unspecified: Secondary | ICD-10-CM | POA: Diagnosis not present

## 2024-04-18 DIAGNOSIS — T8859XA Other complications of anesthesia, initial encounter: Secondary | ICD-10-CM | POA: Diagnosis not present

## 2024-04-18 DIAGNOSIS — M1990 Unspecified osteoarthritis, unspecified site: Secondary | ICD-10-CM | POA: Diagnosis not present

## 2024-04-18 DIAGNOSIS — D649 Anemia, unspecified: Secondary | ICD-10-CM | POA: Diagnosis not present

## 2024-04-18 DIAGNOSIS — M545 Low back pain, unspecified: Secondary | ICD-10-CM | POA: Diagnosis not present

## 2024-04-18 DIAGNOSIS — M2550 Pain in unspecified joint: Secondary | ICD-10-CM | POA: Diagnosis not present

## 2024-04-18 DIAGNOSIS — D696 Thrombocytopenia, unspecified: Secondary | ICD-10-CM | POA: Diagnosis not present

## 2024-04-18 DIAGNOSIS — B9689 Other specified bacterial agents as the cause of diseases classified elsewhere: Secondary | ICD-10-CM | POA: Diagnosis not present

## 2024-04-18 DIAGNOSIS — R69 Illness, unspecified: Secondary | ICD-10-CM | POA: Diagnosis not present

## 2024-04-18 LAB — AEROBIC/ANAEROBIC CULTURE W GRAM STAIN (SURGICAL/DEEP WOUND)
Culture: NO GROWTH
Culture: NO GROWTH
Culture: NO GROWTH
Gram Stain: NONE SEEN
Gram Stain: NONE SEEN

## 2024-04-18 MED ORDER — POLYETHYLENE GLYCOL 3350 17 G PO PACK: 17.0000 g | PACK | Freq: Every day | ORAL | Status: AC

## 2024-04-18 MED ORDER — OMEPRAZOLE 40 MG PO CPDR: 40.0000 mg | DELAYED_RELEASE_CAPSULE | Freq: Every day | ORAL | Status: DC

## 2024-04-18 MED ORDER — APIXABAN 2.5 MG PO TABS: 2.5000 mg | ORAL_TABLET | Freq: Two times a day (BID) | ORAL | 0 refills | Status: DC

## 2024-04-18 MED ORDER — ONDANSETRON HCL 4 MG PO TABS: 4.0000 mg | ORAL_TABLET | Freq: Three times a day (TID) | ORAL | 0 refills | Status: AC | PRN

## 2024-04-18 MED ORDER — HEPARIN SOD (PORK) LOCK FLUSH 100 UNIT/ML IV SOLN
250.0000 [IU] | INTRAVENOUS | Status: AC | PRN
  Administered 2024-04-18: 250 [IU]

## 2024-04-18 MED ORDER — DAPTOMYCIN IV (FOR PTA / DISCHARGE USE ONLY): 700.0000 mg | INTRAVENOUS | 0 refills | Status: AC

## 2024-04-18 NOTE — Progress Notes (Signed)
 Report called to [facility] RN at [place].

## 2024-04-18 NOTE — TOC Transition Note (Signed)
 Transition of Care [facility]) - Discharge Note   Patient Details  Name: Ashley [name] MRN: [id number] Date of Birth: December 11, 1975  Transition of Care [facility]) CM/SW Contact:  NORMAN ASPEN, LCSW Phone Number: 04/18/2024, 11:36 AM   Clinical Narrative:     Pt medically cleared for dc today to [facility].  Have confirmed with facility they have antibiotics onsite.  Pt and family aware/ agreeable with dc today.  PTAR called at 11:35am.  RN to call report to [phone].  No further TOC needs.   Final next level of care: Skilled Nursing Facility Barriers to Discharge: Barriers Resolved   Patient Goals and CMS Choice Patient states their goals for this hospitalization and ongoing recovery are:: return home          Discharge Placement PASRR number recieved: 04/13/24            Patient chooses bed at: [facility] Patient to be transferred to facility by: PTAR Name of family member notified: sister Patient and family notified of of transfer: 04/18/24  Discharge Plan and Services Additional resources added to the After Visit Summary for                  DME Arranged: Walker rolling DME Agency: [employer]       HH Arranged: NA HH Agency: NA Date HH Agency Contacted: 04/06/24 Time HH Agency Contacted: 1200 Representative spoke with at [facility] Agency: Holley Herring  Social Drivers of Health (SDOH) Interventions SDOH Screenings   Food Insecurity: Patient Declined (04/04/2024)  Housing: Unknown (04/04/2024)  Transportation Needs: Patient Declined (04/04/2024)  Utilities: Patient Declined (04/04/2024)  Depression (PHQ2-9): Low Risk  (07/08/2020)  Tobacco Use: Low Risk  (04/04/2024)     Readmission Risk Interventions    04/05/2024    1:25 PM  Readmission Risk Prevention Plan  Post Dischage Appt Complete  Medication Screening Complete  Transportation Screening Complete

## 2024-04-18 NOTE — Plan of Care (Signed)
  Problem: Clinical Measurements: Goal: Will remain free from infection Outcome: Progressing Goal: Diagnostic test results will improve Outcome: Progressing   Problem: Activity: Goal: Risk for activity intolerance will decrease Outcome: Progressing   Problem: Nutrition: Goal: Adequate nutrition will be maintained Outcome: Progressing   Problem: Pain Managment: Goal: General experience of comfort will improve and/or be controlled Outcome: Progressing   Problem: Safety: Goal: Ability to remain free from injury will improve Outcome: Progressing   Problem: Skin Integrity: Goal: Risk for impaired skin integrity will decrease Outcome: Progressing   Problem: Activity: Goal: Ability to avoid complications of mobility impairment will improve Outcome: Progressing Goal: Ability to tolerate increased activity will improve Outcome: Progressing   Problem: Clinical Measurements: Goal: Postoperative complications will be avoided or minimized Outcome: Progressing   Problem: Pain Management: Goal: Pain level will decrease with appropriate interventions Outcome: Progressing

## 2024-04-18 NOTE — Plan of Care (Signed)
   Problem: Clinical Measurements: Goal: Will remain free from infection Outcome: Progressing Goal: Diagnostic test results will improve Outcome: Progressing   Problem: Activity: Goal: Risk for activity intolerance will decrease Outcome: Progressing

## 2024-04-18 NOTE — Progress Notes (Signed)
     Subjective: Ashley [name] doing okay this morning, resting.  FM at bedside states she was talking this morning and recently fell back asleep.  She is deconditioned at this point.  Anticipate discharge to rehab for additional therapy, likely today.  Soon to have breakfast.  X-rays yesterday reassuring and discussed.  Bandage removed, incision healing okay.  Discussed post-operative expectations and plan for follow up in 4 weeks.  Objective:   VITALS:   Vitals:   04/17/24 0600 04/17/24 1404 04/17/24 2017 04/18/24 0532  BP: 116/77 129/71 124/72 105/64  Pulse: 72 85 86 74  Resp: 14 18 19 18   Temp: 97.8 F (36.6 C) 98.2 F (36.8 C) 97.9 F (36.6 C) 97.9 F (36.6 C)  TempSrc:   Oral Oral  SpO2: 98% 100% 96% 100%  Weight:      Height:        AAOx4, resting comfortably, in NAD Sensation intact distally Intact pulses distally Dorsiflexion/Plantar flexion intact Dressing removed, incision appears healing well without erythema or drainage Compartment soft Wiggles toes appropriately    Lab Results  Component Value Date   WBC 8.3 04/17/2024   HGB 9.1 (L) 04/17/2024   HCT 29.2 (L) 04/17/2024   MCV 83.2 04/17/2024   PLT 317 04/17/2024   BMET    Component Value Date/Time   NA 140 04/17/2024 0312   NA 139 10/21/2023 1054   K 3.8 04/17/2024 0312   CL 104 04/17/2024 0312   CO2 27 04/17/2024 0312   GLUCOSE 96 04/17/2024 0312   BUN 14 04/17/2024 0312   BUN 8 10/21/2023 1054   CREATININE 0.90 04/17/2024 0312   CALCIUM  8.5 (L) 04/17/2024 0312   CALCIUM  8.3 (L) 08/28/2010 0525   EGFR 91 10/21/2023 1054   GFRNONAA >60 04/17/2024 0312    Xray: Antibiotic spacer components in good position no adverse features  Assessment/Plan: 14 Days Post-Op   Principal Problem:   Failed total hip arthroplasty (HCC) Active Problems:   Essential hypertension   Depressive disorder   Chronic pain syndrome   Acute encephalopathy   Thrombocytopenia (HCC)   Normocytic anemia    Delirium  Status post right explant antibiotic spacer placement for chronic prosthetic joint infection  Post op recs: WB: 20% weightbearing right lower extremity, posterior hip precautions Abx: Currently advised broad-spectrum antibiotics with daptomycin  and unasyn  per infectious disease.  Recs are appreciated.  IntraOp cultures with 1 out of 4 showing rare gram-positive cocci we will continue to follow cultures for growth. imaging: PACU pelvis Xray Dressing: dermabond and aquacell dressing; 7/30 dressing removed can leave wound open to air -- no submersion/bathtub. DVT prophylaxis: Eliquis  2.5 mg postop day 1 Follow up: 6 weeks after surgery for a wound check and repeat x-rays with Dr. Edna at [facility].  Address: [address], [place], [place] [address]  Office Phone: [phone]   Ashley [name] 04/18/2024, 7:05 AM     Contact information:   Weekdays 7am-5pm epic message Dr. Edna, or call office for patient follow up: [phone] After hours and holidays please check [email or web address] for group call information for [facility]

## 2024-04-18 NOTE — Discharge Summary (Signed)
 Physician Discharge Summary  Patient ID: Ashley [name] MRN: [id number] DOB/AGE: 10/12/1975 48 y.o.  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   Complication of anesthesia    Depression    History of pneumonia    Hypertension    Infertility, female    Joint pain    Lumbar spondylosis    Neuromuscular disease (HCC)    numbness, paresthesias RLE, from failed lumbar back surgery   Osteoarthritis    PCOS (polycystic ovarian syndrome)    Pneumonia    2016   PONV (postoperative nausea and vomiting)    Pre-diabetes    Swelling of lower extremity     Surgeries: Procedure(s): REVISION, ARTHROPLASTY, HIP on 04/04/2024   Consultants (if any): Treatment Team:  Jadine Toribio SQUIBB, MD Lue Elsie BROCKS, MD  Discharged Condition: Improved  Hospital Course: Ashley [name] is an 48 y.o. female who was admitted 04/04/2024 with a diagnosis of Failed total hip arthroplasty (HCC) and went to the operating room on 04/04/2024 and underwent the above named procedures.  Initially doing well after surgery, then became slow to mobilize and less communicative.  Hospitalist team provided extensive evaluation and treatment, likely inorganic cause, see separate notes.  Patient improved with time however due to this delay, became mildly deconditioned.  Recommendations for skilled nursing facility upon discharge.    She was given perioperative antibiotics:  Anti-infectives (From  admission, onward)    Start     Dose/Rate Route Frequency Ordered Stop   04/18/24 0000  daptomycin  (CUBICIN ) IVPB        700 mg Intravenous Every 24 hours 04/18/24 0758 05/16/24 2359   04/11/24 1400  DAPTOmycin  (CUBICIN ) IVPB 700 mg/100mL premix        700 mg 200 mL/hr over 30 Minutes Intravenous Daily 04/11/24 0612     04/10/24 1400  DAPTOmycin  (CUBICIN ) 700 mg in sodium chloride  0.9 % IVPB  Status:  Discontinued        700 mg 128 mL/hr over 30 Minutes Intravenous Daily 04/10/24 1012 04/11/24 0612   04/06/24 1430  Ampicillin -Sulbactam (UNASYN ) 3 g in sodium chloride  0.9 % 100 mL IVPB  Status:  Discontinued        3 g 200 mL/hr over 30 Minutes Intravenous Every 6 hours 04/06/24 1340 04/09/24 1026   04/04/24 1815  ceFAZolin  (ANCEF ) IVPB 2g/100 mL premix  Status:  Discontinued        2 g 200 mL/hr over 30 Minutes Intravenous Every 6 hours 04/04/24 1725 04/04/24 1754   04/04/24 1630  DAPTOmycin  (CUBICIN ) IVPB 700 mg/100mL premix  Status:  Discontinued        8 mg/kg  89.3 kg (Adjusted) 200 mL/hr over 30 Minutes Intravenous Daily 04/04/24 1244 04/10/24 1013   04/04/24 1600  cefTRIAXone  (ROCEPHIN ) 2 g in sodium chloride  0.9 % 100 mL IVPB  Status:  Discontinued        2 g 200 mL/hr over 30  Minutes Intravenous Every 24 hours 04/04/24 1244 04/06/24 0944   04/04/24 1441  tobramycin  (NEBCIN ) powder  Status:  Discontinued          As needed 04/04/24 1441 04/04/24 1545   04/04/24 1440  vancomycin  (VANCOCIN ) powder  Status:  Discontinued          As needed 04/04/24 1440 04/04/24 1545   04/04/24 1236  tobramycin  (NEBCIN ) powder  Status:  Discontinued          As needed 04/04/24 1236 04/04/24 1545   04/04/24 1235  vancomycin  (VANCOCIN ) powder  Status:  Discontinued          As needed 04/04/24 1235 04/04/24 1545   04/04/24 0900  ceFAZolin  (ANCEF ) IVPB 2g/100 mL premix        2 g 200 mL/hr over 30 Minutes Intravenous On call to O.R. 04/04/24 9150 04/04/24 1506     .  She was given sequential  compression devices, early ambulation, and Eliquis  for DVT prophylaxis.  She benefited maximally from the hospital stay and there were no complications.    Recent vital signs:  Vitals:   04/17/24 2017 04/18/24 0532  BP: 124/72 105/64  Pulse: 86 74  Resp: 19 18  Temp: 97.9 F (36.6 C) 97.9 F (36.6 C)  SpO2: 96% 100%    Recent laboratory studies:  Lab Results  Component Value Date   HGB 9.1 (L) 04/17/2024   HGB 8.7 (L) 04/14/2024   HGB 9.6 (L) 04/11/2024   Lab Results  Component Value Date   WBC 8.3 04/17/2024   PLT 317 04/17/2024   Lab Results  Component Value Date   INR 0.95 09/14/2013   Lab Results  Component Value Date   NA 140 04/17/2024   K 3.8 04/17/2024   CL 104 04/17/2024   CO2 27 04/17/2024   BUN 14 04/17/2024   CREATININE 0.90 04/17/2024   GLUCOSE 96 04/17/2024    Discharge Medications:   Allergies as of 04/18/2024       Reactions   Gadolinium Derivatives Nausea And Vomiting, Other (See Comments)   Pt describes this happens every time she gets gado even with slow injection   Codeine Itching, Nausea Only, Other (See Comments)   Tolerates oxycodone     Duloxetine    altered mental status        Medication List     STOP taking these medications    ibuprofen  800 MG tablet Commonly known as: ADVIL        TAKE these medications    acetaminophen  500 MG tablet Commonly known as: TYLENOL  Take 1 tablet (500 mg total) by mouth every 12 (twelve) hours.   apixaban  2.5 MG Tabs tablet Commonly known as: Eliquis  Take 1 tablet (2.5 mg total) by mouth 2 (two) times daily.   baclofen  10 MG tablet Commonly known as: LIORESAL  Take 1 tablet by mouth twice a day What changed: Another medication with the same name was removed. Continue taking this medication, and follow the directions you see here.   BARIATRIC MULTIVITAMINS/IRON PO Take 1 tablet by mouth daily.   buPROPion  150 MG 24 hr tablet Commonly known as: WELLBUTRIN  XL Take 150 mg by mouth  daily.   calcium  carbonate 750 MG chewable tablet Commonly known as: TUMS EX Chew 2 tablets by mouth daily.   carvedilol  6.25 MG tablet Commonly known as: COREG  Take 6.25 mg by mouth 2 (two) times daily with a meal.   daptomycin  IVPB Commonly known as: CUBICIN  Inject 700 mg  into the vein daily for 28 days. Indication:  R-hip PJI First Dose: Yes Last Day of Therapy:  05/16/24 Labs - Once weekly:  CBC/D, BMP, and CPK Labs - Once weekly: ESR and CRP Method of administration: IV Push Method of administration may be changed at the discretion of home infusion pharmacist based upon assessment of the patient and/or caregiver's ability to self-administer the medication ordered.   docusate sodium  100 MG capsule Commonly known as: COLACE Take 200 mg by mouth daily as needed for mild constipation.   EPINEPHrine  0.3 mg/0.3 mL Soaj injection Commonly known as: EPI-PEN Inject 3 mLs into the muscle once.   furosemide  20 MG tablet Commonly known as: LASIX  Take 20 mg by mouth daily as needed for edema.   K2 PLUS D3 PO Take 2 capsules by mouth daily.   lidocaine  5 % Commonly known as: LIDODERM  Apply 3 patches to skin once daily as directed-remove after 12 hours What changed:  how much to take when to take this reasons to take this Another medication with the same name was removed. Continue taking this medication, and follow the directions you see here.   lisdexamfetamine 50 MG capsule Commonly known as: VYVANSE  Take 50 mg by mouth daily.   losartan -hydrochlorothiazide  100-12.5 MG tablet Commonly known as: HYZAAR Take 1 tablet by mouth daily.   omeprazole  40 MG capsule Commonly known as: PRILOSEC Take 1 capsule (40 mg total) by mouth daily for 21 days.   ondansetron  4 MG tablet Commonly known as: Zofran  Take 1 tablet (4 mg total) by mouth every 8 (eight) hours as needed for up to 14 days for nausea or vomiting.   oxyCODONE  5 MG immediate release tablet Commonly known as:  Roxicodone  Take 1-2 tablets (5-10 mg total) by mouth every 6 (six) hours as needed for up to 7 days for breakthrough pain.   oxyCODONE -acetaminophen  10-325 MG tablet Commonly known as: Percocet Take 1 tablet by mouth every 6 (six) hours as needed What changed: Another medication with the same name was removed. Continue taking this medication, and follow the directions you see here.   polyethylene glycol 17 g packet Commonly known as: MiraLax  Take 17 g by mouth daily.   pregabalin  75 MG capsule Commonly known as: Lyrica  Take 1 capsule by mouth three times a day What changed: Another medication with the same name was removed. Continue taking this medication, and follow the directions you see here.   ProAir  RespiClick 108 (90 Base) MCG/ACT Aepb Generic drug: Albuterol  Sulfate Inhale 1 puff into the lungs every 4 (four) hours as needed.   rosuvastatin  10 MG tablet Commonly known as: CRESTOR  Take 1 tablet (10 mg total) by mouth daily.   Xyzal Allergy 24HR 5 MG tablet Generic drug: levocetirizine Take 5 mg by mouth daily as needed for allergies.   zolpidem  10 MG tablet Commonly known as: AMBIEN  Take 10 mg by mouth at bedtime as needed for sleep.       ASK your doctor about these medications    methocarbamol  500 MG tablet Commonly known as: ROBAXIN  Take 1 tablet (500 mg total) by mouth every 8 (eight) hours as needed for up to 10 days for muscle spasms. Ask about: Should I take this medication?               Discharge Care Instructions  (From admission, onward)           Start     Ordered   04/18/24 0000  Change dressing on  IV access line weekly and PRN  (Home infusion instructions - [facility] )        04/18/24 0758            Diagnostic Studies: DG HIP UNILAT WITH PELVIS 2-3 VIEWS RIGHT Result Date: 04/17/2024 CLINICAL DATA:  Two week postop EXAM: DG HIP (WITH OR WITHOUT PELVIS) 2-3V RIGHT COMPARISON:  Right hip x-ray 04/04/2024 FINDINGS:  Postoperative changes of the right hip with cement and hip screw appear unchanged from prior. No evidence for acute fracture or dislocation. No hardware loosening. Left hip is unremarkable. There surgical clips in the pelvis. Spinal fusion hardware is partially visualized in the lower lumbar region. IMPRESSION: Unchanged postoperative changes of the right hip with cement and hip screw. Electronically Signed   By: Greig Pique M.D.   On: 04/17/2024 17:11   EEG adult Result Date: 04/10/2024 Ashley Arlin KIDD, MD     04/10/2024  4:28 PM Patient Name: Ashley [name] MRN: [id number] Epilepsy Attending: Arlin KIDD Ashley Referring Physician/Provider: Merrianne Locus, MD Date: 04/10/2024 Duration: 22.13 mins Patient history: 48yo female with ams. EEG to evaluate for seizure. Level of alertness: Awake AEDs during EEG study: None Technical aspects: This EEG study was done with scalp electrodes positioned according to the 10-20 International system of electrode placement. Electrical activity was reviewed with band pass filter of 1-70Hz , sensitivity of 7 uV/mm, display speed of 67mm/sec with a 60Hz  notched filter applied as appropriate. EEG data were recorded continuously and digitally stored.  Video monitoring was available and reviewed as appropriate. Description: The posterior dominant rhythm consists of 9-10 Hz activity of moderate voltage (25-35 uV) seen predominantly in posterior head regions, symmetric and reactive to eye opening and eye closing. Hyperventilation and photic stimulation were not performed.   IMPRESSION: This study is within normal limits. No seizures or epileptiform discharges were seen throughout the recording. A normal interictal EEG does not exclude  the diagnosis of epilepsy. Arlin KIDD Ashley   DG CHEST PORT 1 VIEW Result Date: 04/05/2024 CLINICAL DATA:  Altered mental status EXAM: PORTABLE CHEST 1 VIEW COMPARISON:  11/24/2018 FINDINGS: Cardiac shadow is within normal limits. Lungs are well aerated  bilaterally. Diffuse left perihilar infiltrate is noted. No sizable effusion is seen. No bony abnormality is noted. IMPRESSION: Left perihilar infiltrate. Electronically Signed   By: Oneil Devonshire M.D.   On: 04/05/2024 20:52   US  EKG SITE RITE Result Date: 04/05/2024 If Site Rite image not attached, placement could not be confirmed due to current cardiac rhythm.  CT HEAD WO CONTRAST ( ) Result Date: 04/05/2024 CLINICAL DATA:  Mental status change, persistent or worsening EXAM: CT HEAD WITHOUT CONTRAST TECHNIQUE: Contiguous axial images were obtained from the base of the skull through the vertex without intravenous contrast. RADIATION DOSE REDUCTION: This exam was performed according to the departmental dose-optimization program which includes automated exposure control, adjustment of the mA and/or kV according to patient size and/or use of iterative reconstruction technique. COMPARISON:  CT of the head dated August 02, 2011. FINDINGS: Brain: Normal brain. No evidence of hemorrhage, mass, acute cortical infarct or hydrocephalus. Vascular: Negative. Skull: Intact and unremarkable. Sinuses/Orbits: No acute process. Other: None. IMPRESSION: Normal. Electronically Signed   By: Evalene Coho M.D.   On: 04/05/2024 15:20   DG HIP UNILAT W OR W/O PELVIS 2-3 VIEWS RIGHT Result Date: 04/04/2024 EXAM: 2 or 3 VIEW(S) XRAY OF THE RIGHT HIP 04/04/2024 05:02:00 PM COMPARISON: Right hip MRI dated 12/29/2023. CLINICAL HISTORY: Post-operative state  of right hip. FINDINGS: BONES AND JOINTS: Postoperative change of the right hip. No acute fracture or dislocation. The procedure performed on the right hip was an explantation and debridement of the right hip with implantation of antibiotic articulating hip spacer. SOFT TISSUES: Tubal ligation clips in the pelvis. IMPRESSION: 1. Postoperative changes in the right hip. Electronically signed by: Norman Gatlin MD 04/04/2024 07:11 PM EDT RP Workstation: [id number]     Disposition: Discharge disposition: 01-Home or Self Care       Discharge Instructions     [facility] pharmacist to adjust dose for Vancomycin , Aminoglycosides and other anti-infective therapies as requested by physician.   Complete by: As directed    [facility] to provide Cath Flo 2mg    Complete by: As directed    Administer for PICC line occlusion and as ordered by physician for other access device issues.   Anaphylaxis Kit: Provided to treat any anaphylactic reaction to the medication being provided to the patient if First Dose or when requested by physician   Complete by: As directed    Epinephrine  1mg /ml vial / amp: Administer 0.3mg  (0.61ml) subcutaneously once for moderate to severe anaphylaxis, nurse to call physician and pharmacy when reaction occurs and call 911 if needed for immediate care   Diphenhydramine  50mg /ml IV vial: Administer 25-50mg  IV/IM PRN for first dose reaction, rash, itching, mild reaction, nurse to call physician and pharmacy when reaction occurs   Sodium Chloride  0.9% NS 500ml IV: Administer if needed for hypovolemic blood pressure drop or as ordered by physician after call to physician with anaphylactic reaction   Call MD / Call 911   Complete by: As directed    If you experience chest pain or shortness of breath, CALL 911 and be transported to the hospital emergency room.  If you develope a fever above 101 F, pus (white drainage) or increased drainage or redness at the wound, or calf pain, call your surgeon's office.   Change dressing on IV access line weekly and PRN   Complete by: As directed    Constipation Prevention   Complete by: As directed    Drink plenty of fluids.  Prune juice may be helpful.  You may use a stool softener, such as Colace (over the counter) 100 mg twice a day.  Use MiraLax  (over the counter) for constipation as needed.   Diet - low sodium heart healthy   Complete by: As directed    Driving restrictions    Complete by: As directed    No driving for minimum 6 weeks   Flush IV access with Sodium Chloride  0.9% and Heparin  10 units/ml or 100 units/ml   Complete by: As directed    Follow the hip precautions as taught in Physical Therapy   Complete by: As directed    Home infusion instructions - [facility]   Complete by: As directed    Instructions: Flush IV access with Sodium Chloride  0.9% and Heparin  10units/ml or 100units/ml   Change dressing on IV access line: Weekly and PRN   Instructions Cath Flo 2mg : Administer for PICC Line occlusion and as ordered by physician for other access device   [facility] pharmacist to adjust dose for: Vancomycin , Aminoglycosides and other anti-infective therapies as requested by physician   Increase activity slowly as tolerated   Complete by: As directed    Method of administration may be changed at the discretion of home infusion pharmacist based upon assessment of the patient and/or caregiver's  ability to self-administer the medication ordered   Complete by: As directed    Post-operative opioid taper instructions:   Complete by: As directed    POST-OPERATIVE OPIOID TAPER INSTRUCTIONS: It is important to wean off of your opioid medication as soon as possible. If you do not need pain medication after your surgery it is ok to stop day one. Opioids include: Codeine, Hydrocodone (Norco, Vicodin), Oxycodone (Percocet, oxycontin ) and hydromorphone  amongst others.  Long term and even short term use of opiods can cause: Increased pain response Dependence Constipation Depression Respiratory depression And more.  Withdrawal symptoms can include Flu like symptoms Nausea, vomiting And more Techniques to manage these symptoms Hydrate well Eat regular healthy meals Stay active Use relaxation techniques(deep breathing, meditating, yoga) Do Not substitute Alcohol  to help with tapering If you have been on opioids for less than two weeks and do  not have pain than it is ok to stop all together.  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 Maintain the same interval or time between taking each dose and first decrease the dose.  Cut the total daily intake of opioids by one tablet each day Next start to increase the time between doses. The last dose that should be eliminated is the evening dose.      TED hose   Complete by: As directed    Use stockings (TED hose) for 2 weeks on both leg(s).  Then for 2 more weeks on the surgical leg.  You may remove them at night for sleeping.        Follow-up Information     Ashley Toribio LABOR, MD Follow up in 2 week(s).   Specialty: Orthopedic Surgery Contact information: [address] [place] [address] [phone]         [facility] Follow up.   Specialty: [facility] Contact information: [address] [place] [address] [place] [address] [phone]         [facility] Follow up.   Contact information: (Home IV Antibiotics) [address] [place], [place] [address] [phone]                   Discharge Instructions      INSTRUCTIONS AFTER JOINT REPLACEMENT   Remove items at home which could result in a fall. This includes throw rugs or furniture in walking pathways ICE to the affected joint every three hours while awake for 30 minutes at a time, for at least the first 3-5 days, and then as needed for pain and swelling.  Continue to use ice for pain and swelling. You may notice swelling that will progress down to the foot and ankle.  This is normal after surgery.  Elevate your leg when you are not up walking on it.   Continue to use the breathing machine you got in the hospital (incentive spirometer) which will help keep your temperature down.  It is common for your temperature to cycle up and down following surgery, especially at night when you are not up moving around  and exerting yourself.  The breathing machine keeps your lungs expanded and your temperature down.  DIET:  As you were doing prior to hospitalization, we recommend a well-balanced diet.  DRESSING / WOUND CARE / SHOWERING:  Keep the surgical dressing until follow up.  The dressing is water  proof, so you can shower without any extra covering.  IF THE DRESSING FALLS  OFF or the wound gets wet inside, change the dressing with sterile gauze.  Please use good hand washing techniques before changing the dressing.  Do not use any lotions or creams on the incision until instructed by your surgeon.    ACTIVITY  Increase activity slowly as tolerated, but follow the weight bearing instructions below.   No driving for 6 weeks or until further direction given by your physician.  You cannot drive while taking narcotics.  No lifting or carrying greater than 10 lbs. until further directed by your surgeon. Avoid periods of inactivity such as sitting longer than an hour when not asleep. This helps prevent blood clots.  You may return to work once you are authorized by your doctor.   WEIGHT BEARING: Maximum of 50% weight bearing on your surgical leg until further instructed.  EXERCISES  Results after joint replacement surgery are often greatly improved when you follow the exercise, range of motion and muscle strengthening exercises prescribed by your doctor. Safety measures are also important to protect the joint from further injury. Any time any of these exercises cause you to have increased pain or swelling, decrease what you are doing until you are comfortable again and then slowly increase them. If you have problems or questions, call your caregiver or physical therapist for advice.   Rehabilitation is important following a joint replacement. After just a few days of immobilization, the muscles of the leg can become weakened and shrink (atrophy).  These exercises are designed to build up the tone and strength of  the thigh and leg muscles and to improve motion. Often times heat used for twenty to thirty minutes before working out will loosen up your tissues and help with improving the range of motion but do not use heat for the first two weeks following surgery (sometimes heat can increase post-operative swelling).   These exercises can be done on a training (exercise) mat, on the floor, on a table or on a bed. Use whatever works the best and is most comfortable for you.    Use music or television while you are exercising so that the exercises are a pleasant break in your day. This will make your life better with the exercises acting as a break in your routine that you can look forward to.   Perform all exercises about fifteen times, three times per day or as directed.  You should exercise both the operative leg and the other leg as well.  Exercises include:   Quad Sets - Tighten up the muscle on the front of the thigh (Quad) and hold for 5-10 seconds.   Straight Leg Raises - With your knee straight (if you were given a brace, keep it on), lift the leg to 60 degrees, hold for 3 seconds, and slowly lower the leg.  Perform this exercise against resistance later as your leg gets stronger.  Leg Slides: Lying on your back, slowly slide your foot toward your buttocks, bending your knee up off the floor (only go as far as is comfortable). Then slowly slide your foot back down until your leg is flat on the floor again.  Angel Wings: Lying on your back spread your legs to the side as far apart as you can without causing discomfort.  Hamstring Strength:  Lying on your back, push your heel against the floor with your leg straight by tightening up the muscles of your buttocks.  Repeat, but this time bend your knee to a comfortable angle, and push  your heel against the floor.  You may put a pillow under the heel to make it more comfortable if necessary.   A rehabilitation program following joint replacement surgery can speed  recovery and prevent re-injury in the future due to weakened muscles. Contact your doctor or a physical therapist for more information on knee rehabilitation.   CONSTIPATION:  Constipation is defined medically as fewer than three stools per week and severe constipation as less than one stool per week.  Even if you have a regular bowel pattern at home, your normal regimen is likely to be disrupted due to multiple reasons following surgery.  Combination of anesthesia, postoperative narcotics, change in appetite and fluid intake all can affect your bowels.   YOU MUST use at least one of the following options; they are listed in order of increasing strength to get the job done.  They are all available over the counter, and you may need to use some, POSSIBLY even all of these options:    Drink plenty of fluids (prune juice may be helpful) and high fiber foods Colace 100 mg by mouth twice a day  Senokot for constipation as directed and as needed Dulcolax (bisacodyl ), take with full glass of water   Miralax  (polyethylene glycol) once or twice a day as needed.  If you have tried all these things and are unable to have a bowel movement in the first 3-4 days after surgery call either your surgeon or your primary doctor.    If you experience loose stools or diarrhea, hold the medications until you stool forms back up.  If your symptoms do not get better within 1 week or if they get worse, check with your doctor.  If you experience the worst abdominal pain ever or develop nausea or vomiting, please contact the office immediately for further recommendations for treatment.  ITCHING:  If you experience itching with your medications, try taking only a single pain pill, or even half a pain pill at a time.  You can also use Benadryl  over the counter for itching or also to help with sleep.   TED HOSE STOCKINGS:  Use stockings on both legs until for at least 2 weeks or as directed by physician office. They may be  removed at night for sleeping.  MEDICATIONS:  See your medication summary on the "After Visit Summary" that nursing will review with you.  You may have some home medications which will be placed on hold until you complete the course of blood thinner medication.  It is important for you to complete the blood thinner medication as prescribed.  Blood clot prevention (DVT Prophylaxis): After surgery you are at an increased risk for a blood clot.  You were prescribed a blood thinner, Eliquis , to be taken twice daily for a total of 4 weeks from surgery to help reduce your risk of getting a blood clot.  Signs of a pulmonary embolus (blood clot in the lungs) include sudden short of breath, feeling lightheaded or dizzy, chest pain with a deep breath, rapid pulse rapid breathing.  Signs of a blood clot in your arms or legs include new unexplained swelling and cramping, warm, red or darkened skin around the painful area.  Please call the office or 911 right away if these signs or symptoms develop.  PRECAUTIONS:   If you experience chest pain or shortness of breath - call 911 immediately for transfer to the hospital emergency department.   If you develop a fever greater that 101 F,  purulent drainage from wound, increased redness or drainage from wound, foul odor from the wound/dressing, or calf pain - CONTACT YOUR SURGEON.                                                   FOLLOW-UP APPOINTMENTS:  If you do not already have a post-op appointment, please call the office for an appointment to be seen by your surgeon.  Guidelines for how soon to be seen are listed in your "After Visit Summary", but are typically between 2-3 weeks after surgery.  If you have a specialized bandage, you may be told to follow up 1 week after surgery.  POST-OPERATIVE OPIOID TAPER INSTRUCTIONS: It is important to wean off of your opioid medication as soon as possible. If you do not need pain medication after your surgery it is ok to stop day  one. Opioids include: Codeine, Hydrocodone (Norco, Vicodin), Oxycodone (Percocet, oxycontin ) and hydromorphone  amongst others.  Long term and even short term use of opiods can cause: Increased pain response Dependence Constipation Depression Respiratory depression And more.  Withdrawal symptoms can include Flu like symptoms Nausea, vomiting And more Techniques to manage these symptoms Hydrate well Eat regular healthy meals Stay active Use relaxation techniques(deep breathing, meditating, yoga) Do Not substitute Alcohol  to help with tapering If you have been on opioids for less than two weeks and do not have pain than it is ok to stop all together.  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 Maintain the same interval or time between taking each dose and first decrease the dose.  Cut the total daily intake of opioids by one tablet each day Next start to increase the time between doses. The last dose that should be eliminated is the evening dose.   MAKE SURE YOU:  Understand these instructions.  Get help right away if you are not doing well or get worse.    Thank you for letting us  be a part of your medical care team.  It is a privilege we respect greatly.  We hope these instructions will help you stay on track for a fast and full recovery!    Information on my medicine - ELIQUIS  (apixaban )  Why was Eliquis  prescribed for you? Eliquis  was prescribed for you to reduce the risk of blood clots forming after orthopedic surgery.    What do You need to know about Eliquis ? Take your Eliquis  TWICE DAILY - one tablet in the morning and one tablet in the evening with or without food.  It would be best to take the dose about the same time each day.  If you have difficulty swallowing the tablet whole please discuss with your pharmacist how to take the medication safely.  Take Eliquis  exactly as prescribed by your doctor  and DO NOT stop taking Eliquis  without talking to the doctor who prescribed the medication.  Stopping without other medication to take the place of Eliquis  may increase your risk of developing a clot.  After discharge, you should have regular check-up appointments with your healthcare provider that is prescribing your Eliquis .  What do you do if you miss a dose? If a dose of ELIQUIS  is not taken at the scheduled time, take it as soon as possible on the same day and twice-daily administration should  be resumed.  The dose should not be doubled to make up for a missed dose.  Do not take more than one tablet of ELIQUIS  at the same time.  Important Safety Information A possible side effect of Eliquis  is bleeding. You should call your healthcare provider right away if you experience any of the following: Bleeding from an injury or your nose that does not stop. Unusual colored urine (red or dark brown) or unusual colored stools (red or black). Unusual bruising for unknown reasons. A serious fall or if you hit your head (even if there is no bleeding).  Some medicines may interact with Eliquis  and might increase your risk of bleeding or clotting while on Eliquis . To help avoid this, consult your healthcare provider or pharmacist prior to using any new prescription or non-prescription medications, including herbals, vitamins, non-steroidal anti-inflammatory drugs (NSAIDs) and supplements.  This website has more information on Eliquis  (apixaban ): http://www.eliquis .com/eliquis dena          Signed: [name] 04/18/2024, 8:01 AM

## 2024-04-18 NOTE — Care Management Important Message (Signed)
 Important Message  Patient Details IM Letter given to the Patient. Name: Ashley [name] MRN: [id number] Date of Birth: 1976-05-13   Important Message Given:  Yes - Medicare IM     Melba Ates 04/18/2024, 8:27 AM

## 2024-04-19 DIAGNOSIS — R2240 Localized swelling, mass and lump, unspecified lower limb: Secondary | ICD-10-CM | POA: Diagnosis not present

## 2024-04-19 DIAGNOSIS — M1990 Unspecified osteoarthritis, unspecified site: Secondary | ICD-10-CM | POA: Diagnosis not present

## 2024-04-19 DIAGNOSIS — M47896 Other spondylosis, lumbar region: Secondary | ICD-10-CM | POA: Diagnosis not present

## 2024-04-19 DIAGNOSIS — G5601 Carpal tunnel syndrome, right upper limb: Secondary | ICD-10-CM | POA: Diagnosis not present

## 2024-04-19 DIAGNOSIS — G709 Myoneural disorder, unspecified: Secondary | ICD-10-CM | POA: Diagnosis not present

## 2024-04-19 DIAGNOSIS — E7800 Pure hypercholesterolemia, unspecified: Secondary | ICD-10-CM | POA: Diagnosis not present

## 2024-04-19 DIAGNOSIS — D649 Anemia, unspecified: Secondary | ICD-10-CM | POA: Diagnosis not present

## 2024-04-19 DIAGNOSIS — I10 Essential (primary) hypertension: Secondary | ICD-10-CM | POA: Diagnosis not present

## 2024-04-19 DIAGNOSIS — K9189 Other postprocedural complications and disorders of digestive system: Secondary | ICD-10-CM | POA: Diagnosis not present

## 2024-04-19 DIAGNOSIS — R7303 Prediabetes: Secondary | ICD-10-CM | POA: Diagnosis not present

## 2024-04-19 DIAGNOSIS — M2550 Pain in unspecified joint: Secondary | ICD-10-CM | POA: Diagnosis not present

## 2024-04-19 DIAGNOSIS — M179 Osteoarthritis of knee, unspecified: Secondary | ICD-10-CM | POA: Diagnosis not present

## 2024-04-20 DIAGNOSIS — M2550 Pain in unspecified joint: Secondary | ICD-10-CM | POA: Diagnosis not present

## 2024-04-20 DIAGNOSIS — D649 Anemia, unspecified: Secondary | ICD-10-CM | POA: Diagnosis not present

## 2024-04-20 DIAGNOSIS — R2240 Localized swelling, mass and lump, unspecified lower limb: Secondary | ICD-10-CM | POA: Diagnosis not present

## 2024-04-20 DIAGNOSIS — R7303 Prediabetes: Secondary | ICD-10-CM | POA: Diagnosis not present

## 2024-04-20 DIAGNOSIS — K9189 Other postprocedural complications and disorders of digestive system: Secondary | ICD-10-CM | POA: Diagnosis not present

## 2024-04-20 DIAGNOSIS — M1990 Unspecified osteoarthritis, unspecified site: Secondary | ICD-10-CM | POA: Diagnosis not present

## 2024-04-20 DIAGNOSIS — M179 Osteoarthritis of knee, unspecified: Secondary | ICD-10-CM | POA: Diagnosis not present

## 2024-04-20 DIAGNOSIS — G709 Myoneural disorder, unspecified: Secondary | ICD-10-CM | POA: Diagnosis not present

## 2024-04-20 DIAGNOSIS — M47896 Other spondylosis, lumbar region: Secondary | ICD-10-CM | POA: Diagnosis not present

## 2024-04-23 DIAGNOSIS — M2550 Pain in unspecified joint: Secondary | ICD-10-CM | POA: Diagnosis not present

## 2024-04-23 DIAGNOSIS — G709 Myoneural disorder, unspecified: Secondary | ICD-10-CM | POA: Diagnosis not present

## 2024-04-23 DIAGNOSIS — M179 Osteoarthritis of knee, unspecified: Secondary | ICD-10-CM | POA: Diagnosis not present

## 2024-04-23 DIAGNOSIS — R2240 Localized swelling, mass and lump, unspecified lower limb: Secondary | ICD-10-CM | POA: Diagnosis not present

## 2024-04-23 DIAGNOSIS — D649 Anemia, unspecified: Secondary | ICD-10-CM | POA: Diagnosis not present

## 2024-04-23 DIAGNOSIS — K9189 Other postprocedural complications and disorders of digestive system: Secondary | ICD-10-CM | POA: Diagnosis not present

## 2024-04-23 DIAGNOSIS — M1990 Unspecified osteoarthritis, unspecified site: Secondary | ICD-10-CM | POA: Diagnosis not present

## 2024-04-23 DIAGNOSIS — R7303 Prediabetes: Secondary | ICD-10-CM | POA: Diagnosis not present

## 2024-04-23 DIAGNOSIS — M47896 Other spondylosis, lumbar region: Secondary | ICD-10-CM | POA: Diagnosis not present

## 2024-04-24 DIAGNOSIS — Z742 Need for assistance at home and no other household member able to render care: Secondary | ICD-10-CM | POA: Diagnosis not present

## 2024-04-24 DIAGNOSIS — M179 Osteoarthritis of knee, unspecified: Secondary | ICD-10-CM | POA: Diagnosis not present

## 2024-04-24 DIAGNOSIS — R7303 Prediabetes: Secondary | ICD-10-CM | POA: Diagnosis not present

## 2024-04-24 DIAGNOSIS — G709 Myoneural disorder, unspecified: Secondary | ICD-10-CM | POA: Diagnosis not present

## 2024-04-24 DIAGNOSIS — M1990 Unspecified osteoarthritis, unspecified site: Secondary | ICD-10-CM | POA: Diagnosis not present

## 2024-04-24 DIAGNOSIS — M2550 Pain in unspecified joint: Secondary | ICD-10-CM | POA: Diagnosis not present

## 2024-04-24 DIAGNOSIS — M47896 Other spondylosis, lumbar region: Secondary | ICD-10-CM | POA: Diagnosis not present

## 2024-04-24 DIAGNOSIS — K9189 Other postprocedural complications and disorders of digestive system: Secondary | ICD-10-CM | POA: Diagnosis not present

## 2024-04-24 DIAGNOSIS — D649 Anemia, unspecified: Secondary | ICD-10-CM | POA: Diagnosis not present

## 2024-04-24 DIAGNOSIS — Z96641 Presence of right artificial hip joint: Secondary | ICD-10-CM | POA: Diagnosis not present

## 2024-04-24 DIAGNOSIS — R2240 Localized swelling, mass and lump, unspecified lower limb: Secondary | ICD-10-CM | POA: Diagnosis not present

## 2024-04-25 ENCOUNTER — Other Ambulatory Visit (HOSPITAL_COMMUNITY): Payer: Self-pay

## 2024-04-25 DIAGNOSIS — M1990 Unspecified osteoarthritis, unspecified site: Secondary | ICD-10-CM | POA: Diagnosis not present

## 2024-04-25 DIAGNOSIS — K9189 Other postprocedural complications and disorders of digestive system: Secondary | ICD-10-CM | POA: Diagnosis not present

## 2024-04-25 DIAGNOSIS — M2550 Pain in unspecified joint: Secondary | ICD-10-CM | POA: Diagnosis not present

## 2024-04-25 DIAGNOSIS — R7303 Prediabetes: Secondary | ICD-10-CM | POA: Diagnosis not present

## 2024-04-25 DIAGNOSIS — M47817 Spondylosis without myelopathy or radiculopathy, lumbosacral region: Secondary | ICD-10-CM | POA: Diagnosis not present

## 2024-04-25 DIAGNOSIS — M179 Osteoarthritis of knee, unspecified: Secondary | ICD-10-CM | POA: Diagnosis not present

## 2024-04-25 DIAGNOSIS — M47896 Other spondylosis, lumbar region: Secondary | ICD-10-CM | POA: Diagnosis not present

## 2024-04-25 DIAGNOSIS — D649 Anemia, unspecified: Secondary | ICD-10-CM | POA: Diagnosis not present

## 2024-04-25 DIAGNOSIS — G4700 Insomnia, unspecified: Secondary | ICD-10-CM | POA: Diagnosis not present

## 2024-04-25 DIAGNOSIS — R2240 Localized swelling, mass and lump, unspecified lower limb: Secondary | ICD-10-CM | POA: Diagnosis not present

## 2024-04-25 DIAGNOSIS — G709 Myoneural disorder, unspecified: Secondary | ICD-10-CM | POA: Diagnosis not present

## 2024-04-25 DIAGNOSIS — G894 Chronic pain syndrome: Secondary | ICD-10-CM | POA: Diagnosis not present

## 2024-04-25 MED ORDER — OXYCODONE-ACETAMINOPHEN 10-325 MG PO TABS
1.0000 | ORAL_TABLET | Freq: Four times a day (QID) | ORAL | 0 refills | Status: DC
  Filled 2024-04-25 – 2024-05-01 (×2): qty 120, 30d supply, fill #0

## 2024-04-26 DIAGNOSIS — M2550 Pain in unspecified joint: Secondary | ICD-10-CM | POA: Diagnosis not present

## 2024-04-26 DIAGNOSIS — D649 Anemia, unspecified: Secondary | ICD-10-CM | POA: Diagnosis not present

## 2024-04-26 DIAGNOSIS — R7303 Prediabetes: Secondary | ICD-10-CM | POA: Diagnosis not present

## 2024-04-26 DIAGNOSIS — M179 Osteoarthritis of knee, unspecified: Secondary | ICD-10-CM | POA: Diagnosis not present

## 2024-04-26 DIAGNOSIS — R2240 Localized swelling, mass and lump, unspecified lower limb: Secondary | ICD-10-CM | POA: Diagnosis not present

## 2024-04-26 DIAGNOSIS — G709 Myoneural disorder, unspecified: Secondary | ICD-10-CM | POA: Diagnosis not present

## 2024-04-26 DIAGNOSIS — M1990 Unspecified osteoarthritis, unspecified site: Secondary | ICD-10-CM | POA: Diagnosis not present

## 2024-04-26 DIAGNOSIS — M47896 Other spondylosis, lumbar region: Secondary | ICD-10-CM | POA: Diagnosis not present

## 2024-04-26 DIAGNOSIS — K9189 Other postprocedural complications and disorders of digestive system: Secondary | ICD-10-CM | POA: Diagnosis not present

## 2024-04-27 DIAGNOSIS — R7303 Prediabetes: Secondary | ICD-10-CM | POA: Diagnosis not present

## 2024-04-27 DIAGNOSIS — R2240 Localized swelling, mass and lump, unspecified lower limb: Secondary | ICD-10-CM | POA: Diagnosis not present

## 2024-04-27 DIAGNOSIS — G709 Myoneural disorder, unspecified: Secondary | ICD-10-CM | POA: Diagnosis not present

## 2024-04-27 DIAGNOSIS — K9189 Other postprocedural complications and disorders of digestive system: Secondary | ICD-10-CM | POA: Diagnosis not present

## 2024-04-27 DIAGNOSIS — M2550 Pain in unspecified joint: Secondary | ICD-10-CM | POA: Diagnosis not present

## 2024-04-27 DIAGNOSIS — D649 Anemia, unspecified: Secondary | ICD-10-CM | POA: Diagnosis not present

## 2024-04-27 DIAGNOSIS — M1990 Unspecified osteoarthritis, unspecified site: Secondary | ICD-10-CM | POA: Diagnosis not present

## 2024-04-27 DIAGNOSIS — M179 Osteoarthritis of knee, unspecified: Secondary | ICD-10-CM | POA: Diagnosis not present

## 2024-04-27 DIAGNOSIS — M47896 Other spondylosis, lumbar region: Secondary | ICD-10-CM | POA: Diagnosis not present

## 2024-04-30 ENCOUNTER — Ambulatory Visit: Admitting: Family Medicine

## 2024-04-30 DIAGNOSIS — R7303 Prediabetes: Secondary | ICD-10-CM | POA: Diagnosis not present

## 2024-04-30 DIAGNOSIS — M179 Osteoarthritis of knee, unspecified: Secondary | ICD-10-CM | POA: Diagnosis not present

## 2024-04-30 DIAGNOSIS — G709 Myoneural disorder, unspecified: Secondary | ICD-10-CM | POA: Diagnosis not present

## 2024-04-30 DIAGNOSIS — M1990 Unspecified osteoarthritis, unspecified site: Secondary | ICD-10-CM | POA: Diagnosis not present

## 2024-04-30 DIAGNOSIS — M2550 Pain in unspecified joint: Secondary | ICD-10-CM | POA: Diagnosis not present

## 2024-04-30 DIAGNOSIS — D649 Anemia, unspecified: Secondary | ICD-10-CM | POA: Diagnosis not present

## 2024-04-30 DIAGNOSIS — K9189 Other postprocedural complications and disorders of digestive system: Secondary | ICD-10-CM | POA: Diagnosis not present

## 2024-04-30 DIAGNOSIS — M47896 Other spondylosis, lumbar region: Secondary | ICD-10-CM | POA: Diagnosis not present

## 2024-04-30 DIAGNOSIS — R2240 Localized swelling, mass and lump, unspecified lower limb: Secondary | ICD-10-CM | POA: Diagnosis not present

## 2024-05-01 ENCOUNTER — Other Ambulatory Visit (HOSPITAL_COMMUNITY): Payer: Self-pay

## 2024-05-01 DIAGNOSIS — R2240 Localized swelling, mass and lump, unspecified lower limb: Secondary | ICD-10-CM | POA: Diagnosis not present

## 2024-05-01 DIAGNOSIS — M47896 Other spondylosis, lumbar region: Secondary | ICD-10-CM | POA: Diagnosis not present

## 2024-05-01 DIAGNOSIS — G709 Myoneural disorder, unspecified: Secondary | ICD-10-CM | POA: Diagnosis not present

## 2024-05-01 DIAGNOSIS — M2550 Pain in unspecified joint: Secondary | ICD-10-CM | POA: Diagnosis not present

## 2024-05-01 DIAGNOSIS — R7303 Prediabetes: Secondary | ICD-10-CM | POA: Diagnosis not present

## 2024-05-01 DIAGNOSIS — M179 Osteoarthritis of knee, unspecified: Secondary | ICD-10-CM | POA: Diagnosis not present

## 2024-05-01 DIAGNOSIS — D649 Anemia, unspecified: Secondary | ICD-10-CM | POA: Diagnosis not present

## 2024-05-01 DIAGNOSIS — K9189 Other postprocedural complications and disorders of digestive system: Secondary | ICD-10-CM | POA: Diagnosis not present

## 2024-05-01 DIAGNOSIS — M1990 Unspecified osteoarthritis, unspecified site: Secondary | ICD-10-CM | POA: Diagnosis not present

## 2024-05-02 DIAGNOSIS — D649 Anemia, unspecified: Secondary | ICD-10-CM | POA: Diagnosis not present

## 2024-05-02 DIAGNOSIS — G709 Myoneural disorder, unspecified: Secondary | ICD-10-CM | POA: Diagnosis not present

## 2024-05-02 DIAGNOSIS — R2240 Localized swelling, mass and lump, unspecified lower limb: Secondary | ICD-10-CM | POA: Diagnosis not present

## 2024-05-02 DIAGNOSIS — M2550 Pain in unspecified joint: Secondary | ICD-10-CM | POA: Diagnosis not present

## 2024-05-02 DIAGNOSIS — K9189 Other postprocedural complications and disorders of digestive system: Secondary | ICD-10-CM | POA: Diagnosis not present

## 2024-05-02 DIAGNOSIS — M47896 Other spondylosis, lumbar region: Secondary | ICD-10-CM | POA: Diagnosis not present

## 2024-05-02 DIAGNOSIS — M179 Osteoarthritis of knee, unspecified: Secondary | ICD-10-CM | POA: Diagnosis not present

## 2024-05-02 DIAGNOSIS — R7303 Prediabetes: Secondary | ICD-10-CM | POA: Diagnosis not present

## 2024-05-02 DIAGNOSIS — M1990 Unspecified osteoarthritis, unspecified site: Secondary | ICD-10-CM | POA: Diagnosis not present

## 2024-05-03 DIAGNOSIS — R7303 Prediabetes: Secondary | ICD-10-CM | POA: Diagnosis not present

## 2024-05-03 DIAGNOSIS — D649 Anemia, unspecified: Secondary | ICD-10-CM | POA: Diagnosis not present

## 2024-05-03 DIAGNOSIS — R2240 Localized swelling, mass and lump, unspecified lower limb: Secondary | ICD-10-CM | POA: Diagnosis not present

## 2024-05-03 DIAGNOSIS — K9189 Other postprocedural complications and disorders of digestive system: Secondary | ICD-10-CM | POA: Diagnosis not present

## 2024-05-03 DIAGNOSIS — M179 Osteoarthritis of knee, unspecified: Secondary | ICD-10-CM | POA: Diagnosis not present

## 2024-05-03 DIAGNOSIS — M47896 Other spondylosis, lumbar region: Secondary | ICD-10-CM | POA: Diagnosis not present

## 2024-05-03 DIAGNOSIS — T8451XD Infection and inflammatory reaction due to internal right hip prosthesis, subsequent encounter: Secondary | ICD-10-CM | POA: Diagnosis not present

## 2024-05-03 DIAGNOSIS — G709 Myoneural disorder, unspecified: Secondary | ICD-10-CM | POA: Diagnosis not present

## 2024-05-03 DIAGNOSIS — M2550 Pain in unspecified joint: Secondary | ICD-10-CM | POA: Diagnosis not present

## 2024-05-03 DIAGNOSIS — M1990 Unspecified osteoarthritis, unspecified site: Secondary | ICD-10-CM | POA: Diagnosis not present

## 2024-05-04 DIAGNOSIS — M2550 Pain in unspecified joint: Secondary | ICD-10-CM | POA: Diagnosis not present

## 2024-05-04 DIAGNOSIS — R7303 Prediabetes: Secondary | ICD-10-CM | POA: Diagnosis not present

## 2024-05-04 DIAGNOSIS — K9189 Other postprocedural complications and disorders of digestive system: Secondary | ICD-10-CM | POA: Diagnosis not present

## 2024-05-04 DIAGNOSIS — M47896 Other spondylosis, lumbar region: Secondary | ICD-10-CM | POA: Diagnosis not present

## 2024-05-04 DIAGNOSIS — D649 Anemia, unspecified: Secondary | ICD-10-CM | POA: Diagnosis not present

## 2024-05-04 DIAGNOSIS — G709 Myoneural disorder, unspecified: Secondary | ICD-10-CM | POA: Diagnosis not present

## 2024-05-04 DIAGNOSIS — M179 Osteoarthritis of knee, unspecified: Secondary | ICD-10-CM | POA: Diagnosis not present

## 2024-05-04 DIAGNOSIS — R2240 Localized swelling, mass and lump, unspecified lower limb: Secondary | ICD-10-CM | POA: Diagnosis not present

## 2024-05-04 DIAGNOSIS — M1990 Unspecified osteoarthritis, unspecified site: Secondary | ICD-10-CM | POA: Diagnosis not present

## 2024-05-04 LAB — FUNGUS CULTURE RESULT

## 2024-05-04 LAB — FUNGUS CULTURE WITH STAIN

## 2024-05-04 LAB — FUNGAL ORGANISM REFLEX

## 2024-05-06 ENCOUNTER — Other Ambulatory Visit: Payer: Self-pay | Admitting: Cardiology

## 2024-05-06 DIAGNOSIS — R931 Abnormal findings on diagnostic imaging of heart and coronary circulation: Secondary | ICD-10-CM

## 2024-05-06 DIAGNOSIS — E782 Mixed hyperlipidemia: Secondary | ICD-10-CM

## 2024-05-07 DIAGNOSIS — K9189 Other postprocedural complications and disorders of digestive system: Secondary | ICD-10-CM | POA: Diagnosis not present

## 2024-05-07 DIAGNOSIS — R7303 Prediabetes: Secondary | ICD-10-CM | POA: Diagnosis not present

## 2024-05-07 DIAGNOSIS — M47896 Other spondylosis, lumbar region: Secondary | ICD-10-CM | POA: Diagnosis not present

## 2024-05-07 DIAGNOSIS — G709 Myoneural disorder, unspecified: Secondary | ICD-10-CM | POA: Diagnosis not present

## 2024-05-07 DIAGNOSIS — M179 Osteoarthritis of knee, unspecified: Secondary | ICD-10-CM | POA: Diagnosis not present

## 2024-05-07 DIAGNOSIS — M2550 Pain in unspecified joint: Secondary | ICD-10-CM | POA: Diagnosis not present

## 2024-05-07 DIAGNOSIS — M1990 Unspecified osteoarthritis, unspecified site: Secondary | ICD-10-CM | POA: Diagnosis not present

## 2024-05-07 DIAGNOSIS — D649 Anemia, unspecified: Secondary | ICD-10-CM | POA: Diagnosis not present

## 2024-05-07 DIAGNOSIS — R2240 Localized swelling, mass and lump, unspecified lower limb: Secondary | ICD-10-CM | POA: Diagnosis not present

## 2024-05-08 ENCOUNTER — Telehealth: Payer: Self-pay

## 2024-05-08 ENCOUNTER — Encounter: Payer: Self-pay | Admitting: Infectious Diseases

## 2024-05-08 ENCOUNTER — Other Ambulatory Visit (HOSPITAL_COMMUNITY): Payer: Self-pay

## 2024-05-08 ENCOUNTER — Ambulatory Visit (INDEPENDENT_AMBULATORY_CARE_PROVIDER_SITE_OTHER): Payer: Self-pay | Admitting: Infectious Diseases

## 2024-05-08 ENCOUNTER — Other Ambulatory Visit: Payer: Self-pay

## 2024-05-08 VITALS — BP 131/81 | HR 84 | Temp 97.8°F

## 2024-05-08 DIAGNOSIS — R7303 Prediabetes: Secondary | ICD-10-CM | POA: Diagnosis not present

## 2024-05-08 DIAGNOSIS — M179 Osteoarthritis of knee, unspecified: Secondary | ICD-10-CM | POA: Diagnosis not present

## 2024-05-08 DIAGNOSIS — M2550 Pain in unspecified joint: Secondary | ICD-10-CM | POA: Diagnosis not present

## 2024-05-08 DIAGNOSIS — T8451XD Infection and inflammatory reaction due to internal right hip prosthesis, subsequent encounter: Secondary | ICD-10-CM | POA: Diagnosis not present

## 2024-05-08 DIAGNOSIS — M1990 Unspecified osteoarthritis, unspecified site: Secondary | ICD-10-CM | POA: Diagnosis not present

## 2024-05-08 DIAGNOSIS — Z5181 Encounter for therapeutic drug level monitoring: Secondary | ICD-10-CM | POA: Insufficient documentation

## 2024-05-08 DIAGNOSIS — B9689 Other specified bacterial agents as the cause of diseases classified elsewhere: Secondary | ICD-10-CM

## 2024-05-08 DIAGNOSIS — T8450XD Infection and inflammatory reaction due to unspecified internal joint prosthesis, subsequent encounter: Secondary | ICD-10-CM

## 2024-05-08 DIAGNOSIS — D649 Anemia, unspecified: Secondary | ICD-10-CM | POA: Diagnosis not present

## 2024-05-08 DIAGNOSIS — K9189 Other postprocedural complications and disorders of digestive system: Secondary | ICD-10-CM | POA: Diagnosis not present

## 2024-05-08 DIAGNOSIS — Z452 Encounter for adjustment and management of vascular access device: Secondary | ICD-10-CM | POA: Insufficient documentation

## 2024-05-08 DIAGNOSIS — M47896 Other spondylosis, lumbar region: Secondary | ICD-10-CM | POA: Diagnosis not present

## 2024-05-08 DIAGNOSIS — G709 Myoneural disorder, unspecified: Secondary | ICD-10-CM | POA: Diagnosis not present

## 2024-05-08 DIAGNOSIS — T8450XA Infection and inflammatory reaction due to unspecified internal joint prosthesis, initial encounter: Secondary | ICD-10-CM | POA: Insufficient documentation

## 2024-05-08 DIAGNOSIS — R2240 Localized swelling, mass and lump, unspecified lower limb: Secondary | ICD-10-CM | POA: Diagnosis not present

## 2024-05-08 NOTE — Telephone Encounter (Signed)
 Called and spoke with patient regarding interest to discharge home from SNF. Informed pt that she will need to speak with case worker at facility to help coordinate needs for [facility] and pharmacy. Pt will speak with case worker today. Provided her with number for [facility] if they do decide to discharge home.  Requested she have staff reach out to office with any questions. Ashley [name] Code

## 2024-05-08 NOTE — Telephone Encounter (Signed)
-----   Message from Annalee Joseph sent at 05/08/2024  3:00 PM EDT ----- Regarding: RE: Discharge from [facility], got it, can one of you call and inform her/mother that she needs to get case worker of SNF involved and this is not for us  to do. Thx. ----- Message ----- From: Chandra Lorenda HERO, RMA Sent: 05/08/2024   2:45 PM EDT To: Lela HERO Bohr, CMA; Annalee Orem, MD# Subject: RE: Discharge from SNF                         Hey,  This is not something we do.  She will need to speak with the case worker there. ----- Message ----- From: Orem Annalee, MD Sent: 05/08/2024   2:32 PM EDT To: Lela HERO Bohr, CMA; [facility] Triage Nurse Po# Subject: Discharge from SNF                             Team,  Patient had expressed interest to be discharged from SNF early during clinic visit today instead of staying through the course of IV antibiotics through 8/28.   I have reached out to her surgeon Dr Edna and he is OK with the patient being discharged home as long as she can get IV antibiotics at home. So, once we can set up IV antibiotics at home through home health, she can be discharged from SNF.  Thanks.

## 2024-05-08 NOTE — Progress Notes (Unsigned)
 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   Bilateral leg edema 01/09/2024   Chronic pain syndrome 01/09/2024   Hyperlipidemia 12/22/2023   Seasonal allergies 12/22/2023   Vitamin D  deficiency 11/21/2023   Prediabetes 11/03/2023   Allergic rhinitis 01/29/2022   Allergic rhinitis due to animal hair and dander 01/29/2022   Allergic rhinitis due to pollen 01/29/2022   Chronic allergic conjunctivitis 01/29/2022   Seafood allergy 01/29/2022   History of endometrial ablation 07/24/2021   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   PCOS (polycystic ovarian syndrome) 04/02/2021   Depressive disorder 04/02/2021   Generalized OA 04/02/2021   Postoperative pain 04/02/2021   Acute blood loss anemia 04/02/2021   Neuropathic pain 04/02/2021   Therapeutic opioid induced constipation 04/02/2021   Urinary retention 04/02/2021   Fall 04/02/2021   Labile blood pressure 04/02/2021   Pain 04/02/2021   Generalized anxiety disorder 04/02/2021   Obesity 04/02/2021   Body mass index (BMI) 40.0-44.9, adult (HCC) 12/31/2020   Flat foot 05/07/2019   Abnormality of gait 02/22/2019   Herpes simplex 01/18/2019   Lumbosacral radiculopathy at L4 06/09/2018   Paresis of single lower extremity (HCC) 04/26/2018   Lumbar radiculopathy 02/21/2018   Status post surgery 02/17/2018   Hip pain 01/05/2018   Radiculopathy, lumbar region 08/10/2017   Lumbar spondylosis 08/10/2017   Herniated nucleus pulposus, lumbar 05/19/2016   Primary localized osteoarthritis of right hip 04/21/2016   Sinusitis 06/25/2015   Other specified cough 06/25/2015   Essential hypertension 04/03/2015   Dehydration 04/03/2015   CAP (community acquired pneumonia) 03/31/2015   Right lower lobe pneumonia 03/31/2015   Fever 03/31/2015   Pregnancy  04/29/2014   Miscarriage    Incomplete miscarriage 04/02/2011    Class: Acute   Deep venous thrombosis (HCC) 09/20/2010    Class: Chronic    Patient's Medications  New Prescriptions   No medications on file  Previous Medications   ALBUTEROL  SULFATE (PROAIR  RESPICLICK) 108 (90 BASE) MCG/ACT AEPB    Inhale 1 puff into the lungs every 4 (four) hours as needed.   APIXABAN  (ELIQUIS ) 2.5 MG TABS TABLET    Take 1 tablet (2.5 mg total) by mouth 2 (two) times daily.   BACLOFEN  (LIORESAL ) 10 MG TABLET    Take 1 tablet by mouth twice a day   BUPROPION  (WELLBUTRIN  XL) 150 MG 24 HR TABLET    Take 150 mg by mouth daily.   CALCIUM  CARBONATE (TUMS EX) 750 MG CHEWABLE TABLET    Chew 2 tablets by mouth daily.   CARVEDILOL  (COREG ) 6.25 MG TABLET    Take 6.25 mg by mouth 2 (two) times daily with a meal.   DAPTOMYCIN  (CUBICIN ) IVPB    Inject 700 mg into the vein daily for 28 days. Indication:  R-hip PJI First Dose: Yes Last Day of Therapy:  05/16/24 Labs - Once weekly:  CBC/D, BMP, and CPK Labs - Once weekly: ESR and CRP Method of administration: IV Push Method of administration may be changed at the discretion of home infusion pharmacist based upon assessment of the patient and/or caregiver's ability to self-administer the medication ordered.   DOCUSATE SODIUM  (COLACE) 100 MG CAPSULE    Take 200 mg by mouth daily as needed for mild constipation.   EPINEPHRINE  0.3 MG/0.3 ML IJ SOAJ INJECTION    Inject 3  mLs into the muscle once.   FUROSEMIDE  (LASIX ) 20 MG TABLET    Take 20 mg by mouth daily as needed for edema.   LEVOCETIRIZINE (XYZAL ALLERGY 24HR) 5 MG TABLET    Take 5 mg by mouth daily as needed for allergies.   LIDOCAINE  (LIDODERM ) 5 %    Apply 3 patches to skin once daily as directed-remove after 12 hours   LISDEXAMFETAMINE (VYVANSE ) 50 MG CAPSULE    Take 50 mg by mouth daily.   LOSARTAN -HYDROCHLOROTHIAZIDE  (HYZAAR) 100-12.5 MG TABLET    Take 1 tablet by mouth daily.   MULTIPLE VITAMINS-MINERALS  (BARIATRIC MULTIVITAMINS/IRON PO)    Take 1 tablet by mouth daily.   OMEPRAZOLE  (PRILOSEC) 40 MG CAPSULE    Take 1 capsule (40 mg total) by mouth daily for 21 days.   OXYCODONE -ACETAMINOPHEN  (PERCOCET) 10-325 MG TABLET    Take 1 tablet by mouth every 6 (six) hours as directed   POLYETHYLENE GLYCOL (MIRALAX ) 17 G PACKET    Take 17 g by mouth daily.   PREGABALIN  (LYRICA ) 75 MG CAPSULE    Take 1 capsule by mouth three times a day   ROSUVASTATIN  (CRESTOR ) 10 MG TABLET    Take 1 tablet (10 mg total) by mouth daily.   VITAMIN D -VITAMIN K (K2 PLUS D3 PO)    Take 2 capsules by mouth daily.   ZOLPIDEM  (AMBIEN ) 10 MG TABLET    Take 10 mg by mouth at bedtime as needed for sleep.  Modified Medications   No medications on file  Discontinued Medications   No medications on file    Subjective: Discussed the use of AI scribe software for clinical note transcription with the patient, who gave verbal consent to proceed.   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 Traumatic degeneration s/p b/l knee arthroplasty, rt hip arthroplasty, lower back decompression surgery, chronic rt hip pain with acute worsening who is here for HFU after recent admission in July for semi elective 2 stage prosthetic joint replacement. Office aspirate 10K WBC with 90% N, Cx negative, not enough sample to send for synovosure.   Underwent 7/16 excision arthroplasty, cx with GPC in gram stain. Cx no growth on multiple samples. Post operative course complicated by AMS which resolved and discharged on 7/30 to complete 6 weeks of IV daptomycin  through 8/28.   8/19 Accompanied by mother and another family member. She come from SNF. Verified receiving IV daptomycin  from [facility]. Denies any concerns at PICC. She experiences mild tenderness at rt hip but no significant pain, swelling or warmth. Denies fevers, chills. Denies nausea, vomiting, diarrhea.   She is unable to ambulate without a walker and is receiving physical therapy  at the SNF. Saw Dr Edna last week and plan to aspirate hip in 2 weeks after antibiotics completed.   She reports that her PICC line dressing was not changed for almost two weeks in the SNF, and she has issues with the tape coming off. She inquires about obtaining caps for the PICC line due to discomfort with the current setup. Discussed that no PICC supplies at the office and she will need to get it from the SNF. No other complaints.   Review of Systems: all systems reviewed with pertinent positives and negatives as listed above   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   Complication of anesthesia    Depression    History of pneumonia    Hypertension    Infertility, female    Joint pain    Lumbar spondylosis    Neuromuscular disease (HCC)    numbness, paresthesias RLE, from failed lumbar back surgery   Osteoarthritis    PCOS (polycystic ovarian syndrome)    Pneumonia    2016   PONV (postoperative nausea and vomiting)    Pre-diabetes    Swelling of lower extremity    Past Surgical History:  Procedure Laterality Date   ABDOMINAL EXPOSURE N/A 02/17/2018   Procedure: ABDOMINAL EXPOSURE;  Surgeon: Oris Krystal FALCON, MD;  Location: [facility] OR;  Service: Vascular;  Laterality: N/A;   ANKLE SURGERY Bilateral    x 2   ANTERIOR LAT LUMBAR FUSION N/A 02/17/2018   Procedure: Lumbar three-four Lumbar four-five  Anterolateral lumbar interbody fusion;  Surgeon: Colon Shove, MD;  Location: [facility] OR;  Service: Neurosurgery;  Laterality: N/A;   ANTERIOR LUMBAR FUSION N/A 02/17/2018   Procedure: Lumbar five-Sacral one Anterior lumbar interbody fusion;  Surgeon: Colon Shove, MD;  [facility];  Service: Neurosurgery;  Laterality: N/A;   APPLICATION OF ROBOTIC ASSISTANCE FOR SPINAL PROCEDURE N/A 02/17/2018   Procedure: APPLICATION OF  ROBOTIC ASSISTANCE FOR SPINAL PROCEDURE;  Surgeon: Colon Shove, MD;  Location: [facility] OR;  Service: Neurosurgery;  Laterality: N/A;   APPLICATION OF ROBOTIC ASSISTANCE FOR SPINAL PROCEDURE N/A 06/09/2018   Procedure: APPLICATION OF ROBOTIC ASSISTANCE FOR SPINAL PROCEDURE;  Surgeon: Colon Shove, MD;  Location: [facility] OR;  Service: Neurosurgery;  Laterality: N/A;   BACK SURGERY     2017  discectomy   CARPAL TUNNEL RELEASE Left 07/04/2014   Procedure: LEFT CARPAL TUNNEL RELEASE;  Surgeon: Toribio FALCON Chancy, MD;  Location: [facility];  Service: Orthopedics;  Laterality: Left;   CARPAL TUNNEL RELEASE Right 08/08/2014   Procedure: RIGHT CARPAL TUNNEL RELEASE;  Surgeon: Toribio FALCON Chancy, MD;  Location: [facility];  Service: Orthopedics;  Laterality: Right;   COLONOSCOPY  2024   DILATION AND CURETTAGE OF UTERUS     DILATION AND EVACUATION  04/02/2011   Procedure: DILATATION AND EVACUATION (D&E);  Surgeon: Alm JAYSON Cook, MD;  Location: [facility]S;  Service: Gynecology;  Laterality: N/A;  dvt left mid thigh   DORSAL COMPARTMENT RELEASE Left 07/04/2014   Procedure: LEFT DEQUERVAINS;  Surgeon: Toribio FALCON Chancy, MD;  Location: [facility];  Service: Orthopedics;  Laterality: Left;   ENDOSCOPIC PLANTAR FASCIOTOMY Left 02/01/2002   HARDWARE REMOVAL Right 06/09/2018   Procedure: Repositioning of Right Lumbar three Right lumbar  four pedicle screw with MAZOR;  Surgeon: Colon Shove, MD;  Location: [facility] OR;  Service: Neurosurgery;  Laterality: Right;   HYSTEROSCOPY WITH D & C  07/17/2010   with exc. endometrial polyps   KNEE ARTHROSCOPY Right 03/07/2003; 01/14/2005; 08/31/2006   KNEE ARTHROSCOPY Left 03/21/2003; 11/26/2004   LUMBAR PERCUTANEOUS PEDICLE SCREW 3 LEVEL N/A 02/17/2018   Procedure: LUMBAR PERCUTANEOUS PEDICLE SCREW PLACEMENT LUMBAR THREE-SACRAL ONE;  Surgeon: Colon Shove, MD;  Location: [facility] OR;  Service: Neurosurgery;  Laterality: N/A;   SHOULDER ARTHROSCOPY Right    TOE  SURGERY Left 01/10/2003   claw toe correction 2nd, 3rd, 4th toes   TOTAL HIP ARTHROPLASTY Right 04/21/2016   Procedure: TOTAL HIP ARTHROPLASTY ANTERIOR APPROACH;  Surgeon: Toribio FALCON Chancy, MD;  Location: [facility] OR;  Service: Orthopedics;  Laterality: Right;  TOTAL HIP ARTHROPLASTY WITH HARDWARE REMOVAL Right 04/04/2024   Procedure: REVISION, ARTHROPLASTY, HIP;  Surgeon: Edna Toribio LABOR, MD;  Location: [facility];  Service: Orthopedics;  Laterality: Right;   TOTAL KNEE ARTHROPLASTY Left 01/21/2010   TOTAL KNEE ARTHROPLASTY Right 08/26/2008   UPPER GI ENDOSCOPY N/A 04/14/2021   Procedure: UPPER GI ENDOSCOPY;  Surgeon: Gladis Cough, MD;  Location: [facility]S;  Service: General;  Laterality: N/A;    Social History   Tobacco Use   Smoking status: Never   Smokeless tobacco: Never  Vaping Use   Vaping status: Never Used  Substance Use Topics   Alcohol  use: No    Alcohol /week: 0.0 standard drinks of alcohol    Drug use: No    Family History  Problem Relation Age of Onset   Diabetes Father    Cancer Father        COLON   Heart disease Father    Kidney disease Father        dialysis   High blood pressure Father    Heart disease Sister    Diabetes Sister    Heart failure Sister    Heart failure Sister    Heart disease Sister    Cancer Maternal Grandmother        UTERINE   Cancer Paternal Grandmother        breast   Breast cancer Paternal Grandmother    Breast cancer Cousin     Allergies  Allergen Reactions   Gadolinium Derivatives Nausea And Vomiting and Other (See Comments)    Pt describes this happens every time she gets gado even with slow injection    Codeine Itching, Nausea Only and Other (See Comments)    Tolerates oxycodone     Duloxetine     altered mental status    Health Maintenance  Topic Date Due   Hepatitis C Screening  Never done   Hepatitis B Vaccines 19-59 Average Risk (1 of 3 - 19+ 3-dose series) Never done   Cervical Cancer Screening (HPV/Pap Cotest)   06/11/2017   COVID-19 Vaccine (5 - 2024-25 season) 05/22/2023   Medicare Annual Wellness (AWV)  02/16/2024   INFLUENZA VACCINE  04/20/2024   Colonoscopy  10/22/2031   DTaP/Tdap/Td (2 - Td or Tdap) 02/09/2034   HIV Screening  Completed   Pneumococcal Vaccine  Aged Out   HPV VACCINES  Aged Out   Meningococcal B Vaccine  Aged Out    Objective: BP 131/81   Pulse 84   Temp 97.8 F (36.6 C) (Temporal)   SpO2 100%    Physical Exam Constitutional:      Appearance: Normal appearance.  HENT:     Head: Normocephalic and atraumatic.      Mouth: Mucous membranes are moist.  Eyes:    Conjunctiva/sclera: Conjunctivae normal.     Pupils: Pupils are equal, round, and b/l symmetrical    Cardiovascular:     Rate and Rhythm: Normal rate and regular rhythm.     Heart sounds: s1s2  Pulmonary:     Effort: Pulmonary effort is normal.     Breath sounds: Normal breath sounds.   Abdominal:     General: Non distended     Palpations: soft.   Musculoskeletal:        General:sitting in the wheelchair Rt hip surgical site healed with some indurated surrounding skin but no erythema, warmth, tenderness, or fluctuance   Skin:    General: Skin is warm and dry.     Comments: PICC OK  with no signs of infection   Neurological:     General: grossly non focal     Mental Status: awake, alert and oriented to person, place, and time.   Psychiatric:        Mood and Affect: Mood normal.   Lab Results Lab Results  Component Value Date   WBC 8.3 04/17/2024   HGB 9.1 (L) 04/17/2024   HCT 29.2 (L) 04/17/2024   MCV 83.2 04/17/2024   PLT 317 04/17/2024    Lab Results  Component Value Date   CREATININE 0.90 04/17/2024   BUN 14 04/17/2024   NA 140 04/17/2024   K 3.8 04/17/2024   CL 104 04/17/2024   CO2 27 04/17/2024    Lab Results  Component Value Date   ALT 19 04/17/2024   AST 19 04/17/2024   ALKPHOS 64 04/17/2024   BILITOT 0.6 04/17/2024    Lab Results  Component Value Date   CHOL  175 10/21/2023   HDL 58 10/21/2023   LDLCALC 106 (H) 10/21/2023   LDLDIRECT 99 10/21/2023   TRIG 57 10/21/2023   Lab Results  Component Value Date   LABRPR NON REAC 04/29/2014   No results found for: HIV1RNAQUANT, HIV1RNAVL, CD4TABS   Microbiology Results for orders placed or performed during the hospital encounter of 04/04/24  Fungus Culture With Stain     Status: None   Collection Time: 04/04/24 12:38 PM   Specimen: Synovial, Right Hip; Body Fluid  Result Value Ref Range Status   Fungus Stain Final report  Final   Fungus (Mycology) Culture Final report  Final    Comment: (NOTE) Performed At: [facility] [address] [place], [place] [id number] Jennette Shorter MD Ey:[id number]    Fungal Source TISSUE  Final    Comment: RIGHT HIP 1A Performed at [facility], [address], [place], [place] [address]   Aerobic/Anaerobic Culture w Gram Stain (surgical/deep wound)     Status: None   Collection Time: 04/04/24 12:38 PM   Specimen: Synovial, Right Hip; Body Fluid  Result Value Ref Range Status   Specimen Description   Final    TISSUE Performed at [facility], [address], [place], [place] [address]    Special Requests R HIP SAMPLE 1A  Final   Gram Stain NO WBC SEEN NO ORGANISMS SEEN   Final   Culture   Final    No growth aerobically or anaerobically. Performed at [facility], [address], [place], [place] [address]    Report Status 04/09/2024 FINAL  Final  Fungus Culture Result     Status: None   Collection Time: 04/04/24 12:38 PM  Result Value Ref Range Status   Result 1 Comment  Final    Comment: (NOTE) KOH/Calcofluor preparation:  no fungus observed. Performed At: [facility] [address] [place], [place] [id number] Jennette Shorter MD Ey:[id number]   Fungal organism reflex     Status: None   Collection Time: 04/04/24 12:38 PM  Result Value Ref Range Status   Fungal result 1 Comment  Final     Comment: (NOTE) No yeast or mold isolated after 4 weeks. Performed At: [facility] [address] [place], [place] [id number] Jennette Shorter MD Ey:[id number]   Aerobic/Anaerobic Culture w Gram Stain (surgical/deep wound)     Status: None   Collection Time: 04/04/24 12:46 PM   Specimen: Synovial, Right Hip; Body Fluid  Result Value Ref Range Status   Specimen Description  Final    TISSUE Performed at [facility], [address] [place], [place] [address]    Special Requests R HIP SAMPLE B HOLD 2WKS  Final   Gram Stain NO WBC SEEN NO ORGANISMS SEEN   Final   Culture   Final    No growth aerobically or anaerobically. AFTER 14 DAYS Performed at [facility], [address] [place], [place] [address]    Report Status 04/18/2024 FINAL  Final  Fungus Culture With Stain     Status: None   Collection Time: 04/04/24 12:49 PM   Specimen: Synovial, Right Hip; Body Fluid  Result Value Ref Range Status   Fungus Stain Final report  Final   Fungus (Mycology) Culture Final report  Final    Comment: (NOTE) Performed At: [facility] [address] [place], [place] [id number] Jennette Shorter MD Ey:[id number]    Fungal Source RT HIP SAMPLE C  Corrected    Comment: Performed at [facility], [address], [place], [place] [address] CORRECTED ON 07/16 AT 2115: PREVIOUSLY REPORTED AS TISSUE RIGHT HIP   Aerobic/Anaerobic Culture w Gram Stain (surgical/deep wound)     Status: None   Collection Time: 04/04/24 12:49 PM   Specimen: Synovial, Right Hip; Body Fluid  Result Value Ref Range Status   Specimen Description   Final    TISSUE Performed at [facility], [address], [place], [place] [address]    Special Requests   Final    R HIP Performed at [facility], [address], [place], [place] [address]    Gram Stain NO WBC SEEN RARE GRAM POSITIVE COCCI   Final   Culture   Final    No growth  aerobically or anaerobically. AFTER 14 DAYS Performed at [facility], [address], [place], [place] [address]    Report Status 04/18/2024 FINAL  Final  Fungus Culture Result     Status: None   Collection Time: 04/04/24 12:49 PM  Result Value Ref Range Status   Result 1 Comment  Final    Comment: (NOTE) KOH/Calcofluor preparation:  no fungus observed. Performed At: [facility] [address] [place], [place] [id number] Jennette Shorter MD Ey:[id number]   Fungal organism reflex     Status: None   Collection Time: 04/04/24 12:49 PM  Result Value Ref Range Status   Fungal result 1 Comment  Final    Comment: (NOTE) No yeast or mold isolated after 4 weeks. Performed At: [facility] [address] [place], [place] [id number] Jennette Shorter MD Ey:[id number]   Fungus Culture With Stain     Status: None   Collection Time: 04/04/24  1:31 PM   Specimen: Path Tissue  Result Value Ref Range Status   Fungus Stain Final report  Final   Fungus (Mycology) Culture Final report  Final    Comment: (NOTE) Performed At: [facility] [address] [place], [place] [id number] Jennette Shorter MD Ey:[id number]    Fungal Source TISSUE  Final    Comment: RIGHT HIP Performed at [facility], [address] [place], [place] [address]   Aerobic/Anaerobic Culture w Gram Stain (surgical/deep wound)     Status: None   Collection Time: 04/04/24  1:31 PM   Specimen: Path Tissue  Result Value Ref Range Status   Specimen Description   Final    TISSUE Performed at [facility], [address] [place], [place]  [address]    Special Requests R HIP SAMPLE D HOLD 14 DAYSQ  Final   Gram Stain   Final    FEW WBC PRESENT,BOTH PMN AND MONONUCLEAR NO ORGANISMS SEEN    Culture   Final    No growth aerobically or anaerobically. AFTER 14 DAYS Performed at [facility], [address], [place], [place] [address]    Report Status  04/18/2024 FINAL  Final  Fungus Culture Result     Status: None   Collection Time: 04/04/24  1:31 PM  Result Value Ref Range Status   Result 1 Comment  Final    Comment: (NOTE) KOH/Calcofluor preparation:  no fungus observed. Performed At: [facility] [address] [place], [place] [id number] Jennette Shorter MD Ey:[id number]   Fungal organism reflex     Status: None   Collection Time: 04/04/24  1:31 PM  Result Value Ref Range Status   Fungal result 1 Comment  Final    Comment: (NOTE) No yeast or mold isolated after 4 weeks. Performed At: [facility] [address] [place], [place] [id number] Jennette Shorter MD Ey:[id number]    Imaging DG HIP UNILAT WITH PELVIS 2-3 VIEWS RIGHT Result Date: 04/17/2024 CLINICAL DATA:  Two week postop EXAM: DG HIP (WITH OR WITHOUT PELVIS) 2-3V RIGHT COMPARISON:  Right hip x-ray 04/04/2024 FINDINGS: Postoperative changes of the right hip with cement and hip screw appear unchanged from prior. No evidence for acute fracture or dislocation. No hardware loosening. Left hip is unremarkable. There surgical clips in the pelvis. Spinal fusion hardware is partially visualized in the lower lumbar region. IMPRESSION: Unchanged postoperative changes of the right hip with cement and hip screw. Electronically Signed   By: Greig Pique M.D.   On: 04/17/2024 17:11   EEG adult Result Date: 04/10/2024 Shelton Arlin KIDD, MD     04/10/2024  4:28 PM Patient Name: Ashley HARMON MRN: [id number] Epilepsy Attending: Arlin KIDD Shelton Referring Physician/Provider: Merrianne Locus, MD Date: 04/10/2024 Duration: 22.13 mins Patient history: 48yo female with ams. EEG to evaluate for seizure. Level of alertness: Awake AEDs during EEG study: None Technical aspects: This EEG study was done with scalp electrodes positioned according to the 10-20 International system of electrode placement. Electrical activity was reviewed with band pass filter of 1-70Hz , sensitivity of 7 uV/mm,  display speed of 73mm/sec with a 60Hz  notched filter applied as appropriate. EEG data were recorded continuously and digitally stored.  Video monitoring was available and reviewed as appropriate. Description: The posterior dominant rhythm consists of 9-10 Hz activity of moderate voltage (25-35 uV) seen predominantly in posterior head regions, symmetric and reactive to eye opening and eye closing. Hyperventilation and photic stimulation were not performed.   IMPRESSION: This study is within normal limits. No seizures or epileptiform discharges were seen throughout the recording. A normal interictal EEG does not exclude  the diagnosis of epilepsy. 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 Have messaged Dr Edna and will route today's note to Dr Edna  # Medication Monitoring  -8/18  wbc 5.9, hb 9.7, plts 156, CR 0.75, CRP< 0.15, ESR 37, cpk 60  ADDENDUM- patient initially expressed to get discharged from SNF earlier then EOT. However per discussion with  staff, we cannot discharge her earlier and she should discuss with her case worker at [facility].  I spent 37  minutes involved in face-to-face and non-face-to-face activities for this patient on the day of the visit. Professional time spent includes the following activities: Preparing to see the patient (review of tests), Obtaining and reviewing separately obtained history (discharge record 7/30, Dr Overton notes), Performing a medically appropriate examination and evaluation , Ordering medications, referring and communicating with other health care professionals, Documenting clinical information in the EMR, Independently interpreting results (not separately reported), Communicating results to the patient/family members,  Counseling and educating the patient/family members and Care coordination (not separately reported).   Of note, portions of this note may have been created with voice recognition software. While this note has been edited for accuracy, occasional wrong-word or 'sound-a-like' substitutions may have occurred due to the inherent limitations of voice recognition software.   Annalee Joseph, MD [facility] 05/08/2024, 10:30 AM

## 2024-05-09 DIAGNOSIS — K9189 Other postprocedural complications and disorders of digestive system: Secondary | ICD-10-CM | POA: Diagnosis not present

## 2024-05-09 DIAGNOSIS — R2240 Localized swelling, mass and lump, unspecified lower limb: Secondary | ICD-10-CM | POA: Diagnosis not present

## 2024-05-09 DIAGNOSIS — R7303 Prediabetes: Secondary | ICD-10-CM | POA: Diagnosis not present

## 2024-05-09 DIAGNOSIS — D649 Anemia, unspecified: Secondary | ICD-10-CM | POA: Diagnosis not present

## 2024-05-09 DIAGNOSIS — M47896 Other spondylosis, lumbar region: Secondary | ICD-10-CM | POA: Diagnosis not present

## 2024-05-09 DIAGNOSIS — M179 Osteoarthritis of knee, unspecified: Secondary | ICD-10-CM | POA: Diagnosis not present

## 2024-05-09 DIAGNOSIS — G709 Myoneural disorder, unspecified: Secondary | ICD-10-CM | POA: Diagnosis not present

## 2024-05-09 DIAGNOSIS — M2550 Pain in unspecified joint: Secondary | ICD-10-CM | POA: Diagnosis not present

## 2024-05-09 DIAGNOSIS — M1990 Unspecified osteoarthritis, unspecified site: Secondary | ICD-10-CM | POA: Diagnosis not present

## 2024-05-11 DIAGNOSIS — T8450XA Infection and inflammatory reaction due to unspecified internal joint prosthesis, initial encounter: Secondary | ICD-10-CM | POA: Diagnosis not present

## 2024-05-12 NOTE — Progress Notes (Signed)
 Thanks Annalee

## 2024-05-14 DIAGNOSIS — E785 Hyperlipidemia, unspecified: Secondary | ICD-10-CM | POA: Diagnosis not present

## 2024-05-14 DIAGNOSIS — Z96653 Presence of artificial knee joint, bilateral: Secondary | ICD-10-CM | POA: Diagnosis not present

## 2024-05-14 DIAGNOSIS — Z86718 Personal history of other venous thrombosis and embolism: Secondary | ICD-10-CM | POA: Diagnosis not present

## 2024-05-14 DIAGNOSIS — G709 Myoneural disorder, unspecified: Secondary | ICD-10-CM | POA: Diagnosis not present

## 2024-05-14 DIAGNOSIS — I10 Essential (primary) hypertension: Secondary | ICD-10-CM | POA: Diagnosis not present

## 2024-05-14 DIAGNOSIS — G894 Chronic pain syndrome: Secondary | ICD-10-CM | POA: Diagnosis not present

## 2024-05-14 DIAGNOSIS — Z556 Problems related to health literacy: Secondary | ICD-10-CM | POA: Diagnosis not present

## 2024-05-14 DIAGNOSIS — M179 Osteoarthritis of knee, unspecified: Secondary | ICD-10-CM | POA: Diagnosis not present

## 2024-05-14 DIAGNOSIS — J302 Other seasonal allergic rhinitis: Secondary | ICD-10-CM | POA: Diagnosis not present

## 2024-05-14 DIAGNOSIS — J329 Chronic sinusitis, unspecified: Secondary | ICD-10-CM | POA: Diagnosis not present

## 2024-05-14 DIAGNOSIS — K219 Gastro-esophageal reflux disease without esophagitis: Secondary | ICD-10-CM | POA: Diagnosis not present

## 2024-05-14 DIAGNOSIS — M5418 Radiculopathy, sacral and sacrococcygeal region: Secondary | ICD-10-CM | POA: Diagnosis not present

## 2024-05-14 DIAGNOSIS — R7303 Prediabetes: Secondary | ICD-10-CM | POA: Diagnosis not present

## 2024-05-14 DIAGNOSIS — M5126 Other intervertebral disc displacement, lumbar region: Secondary | ICD-10-CM | POA: Diagnosis not present

## 2024-05-14 DIAGNOSIS — M4726 Other spondylosis with radiculopathy, lumbar region: Secondary | ICD-10-CM | POA: Diagnosis not present

## 2024-05-14 DIAGNOSIS — E559 Vitamin D deficiency, unspecified: Secondary | ICD-10-CM | POA: Diagnosis not present

## 2024-05-14 DIAGNOSIS — T8452XD Infection and inflammatory reaction due to internal left hip prosthesis, subsequent encounter: Secondary | ICD-10-CM | POA: Diagnosis not present

## 2024-05-14 DIAGNOSIS — G4700 Insomnia, unspecified: Secondary | ICD-10-CM | POA: Diagnosis not present

## 2024-05-16 DIAGNOSIS — Z556 Problems related to health literacy: Secondary | ICD-10-CM | POA: Diagnosis not present

## 2024-05-16 DIAGNOSIS — G4700 Insomnia, unspecified: Secondary | ICD-10-CM | POA: Diagnosis not present

## 2024-05-16 DIAGNOSIS — Z96653 Presence of artificial knee joint, bilateral: Secondary | ICD-10-CM | POA: Diagnosis not present

## 2024-05-16 DIAGNOSIS — R7303 Prediabetes: Secondary | ICD-10-CM | POA: Diagnosis not present

## 2024-05-16 DIAGNOSIS — Z86718 Personal history of other venous thrombosis and embolism: Secondary | ICD-10-CM | POA: Diagnosis not present

## 2024-05-16 DIAGNOSIS — M5126 Other intervertebral disc displacement, lumbar region: Secondary | ICD-10-CM | POA: Diagnosis not present

## 2024-05-16 DIAGNOSIS — M5418 Radiculopathy, sacral and sacrococcygeal region: Secondary | ICD-10-CM | POA: Diagnosis not present

## 2024-05-16 DIAGNOSIS — E785 Hyperlipidemia, unspecified: Secondary | ICD-10-CM | POA: Diagnosis not present

## 2024-05-16 DIAGNOSIS — T84091D Other mechanical complication of internal left hip prosthesis, subsequent encounter: Secondary | ICD-10-CM | POA: Diagnosis not present

## 2024-05-16 DIAGNOSIS — M4726 Other spondylosis with radiculopathy, lumbar region: Secondary | ICD-10-CM | POA: Diagnosis not present

## 2024-05-16 DIAGNOSIS — J329 Chronic sinusitis, unspecified: Secondary | ICD-10-CM | POA: Diagnosis not present

## 2024-05-16 DIAGNOSIS — T8452XD Infection and inflammatory reaction due to internal left hip prosthesis, subsequent encounter: Secondary | ICD-10-CM | POA: Diagnosis not present

## 2024-05-16 DIAGNOSIS — K219 Gastro-esophageal reflux disease without esophagitis: Secondary | ICD-10-CM | POA: Diagnosis not present

## 2024-05-16 DIAGNOSIS — J302 Other seasonal allergic rhinitis: Secondary | ICD-10-CM | POA: Diagnosis not present

## 2024-05-16 DIAGNOSIS — G709 Myoneural disorder, unspecified: Secondary | ICD-10-CM | POA: Diagnosis not present

## 2024-05-16 DIAGNOSIS — I10 Essential (primary) hypertension: Secondary | ICD-10-CM | POA: Diagnosis not present

## 2024-05-16 DIAGNOSIS — M179 Osteoarthritis of knee, unspecified: Secondary | ICD-10-CM | POA: Diagnosis not present

## 2024-05-16 DIAGNOSIS — G894 Chronic pain syndrome: Secondary | ICD-10-CM | POA: Diagnosis not present

## 2024-05-17 DIAGNOSIS — T8450XA Infection and inflammatory reaction due to unspecified internal joint prosthesis, initial encounter: Secondary | ICD-10-CM | POA: Diagnosis not present

## 2024-05-18 DIAGNOSIS — M179 Osteoarthritis of knee, unspecified: Secondary | ICD-10-CM | POA: Diagnosis not present

## 2024-05-18 DIAGNOSIS — K219 Gastro-esophageal reflux disease without esophagitis: Secondary | ICD-10-CM | POA: Diagnosis not present

## 2024-05-18 DIAGNOSIS — E785 Hyperlipidemia, unspecified: Secondary | ICD-10-CM | POA: Diagnosis not present

## 2024-05-22 DIAGNOSIS — J302 Other seasonal allergic rhinitis: Secondary | ICD-10-CM | POA: Diagnosis not present

## 2024-05-22 DIAGNOSIS — Z86718 Personal history of other venous thrombosis and embolism: Secondary | ICD-10-CM | POA: Diagnosis not present

## 2024-05-22 DIAGNOSIS — I10 Essential (primary) hypertension: Secondary | ICD-10-CM | POA: Diagnosis not present

## 2024-05-22 DIAGNOSIS — G709 Myoneural disorder, unspecified: Secondary | ICD-10-CM | POA: Diagnosis not present

## 2024-05-22 DIAGNOSIS — T8452XD Infection and inflammatory reaction due to internal left hip prosthesis, subsequent encounter: Secondary | ICD-10-CM | POA: Diagnosis not present

## 2024-05-22 DIAGNOSIS — G4700 Insomnia, unspecified: Secondary | ICD-10-CM | POA: Diagnosis not present

## 2024-05-22 DIAGNOSIS — G894 Chronic pain syndrome: Secondary | ICD-10-CM | POA: Diagnosis not present

## 2024-05-22 DIAGNOSIS — Z96653 Presence of artificial knee joint, bilateral: Secondary | ICD-10-CM | POA: Diagnosis not present

## 2024-05-22 DIAGNOSIS — Z556 Problems related to health literacy: Secondary | ICD-10-CM | POA: Diagnosis not present

## 2024-05-22 DIAGNOSIS — E559 Vitamin D deficiency, unspecified: Secondary | ICD-10-CM | POA: Diagnosis not present

## 2024-05-22 DIAGNOSIS — R7303 Prediabetes: Secondary | ICD-10-CM | POA: Diagnosis not present

## 2024-05-22 DIAGNOSIS — M179 Osteoarthritis of knee, unspecified: Secondary | ICD-10-CM | POA: Diagnosis not present

## 2024-05-22 DIAGNOSIS — M4726 Other spondylosis with radiculopathy, lumbar region: Secondary | ICD-10-CM | POA: Diagnosis not present

## 2024-05-22 DIAGNOSIS — T84091D Other mechanical complication of internal left hip prosthesis, subsequent encounter: Secondary | ICD-10-CM | POA: Diagnosis not present

## 2024-05-22 DIAGNOSIS — E785 Hyperlipidemia, unspecified: Secondary | ICD-10-CM | POA: Diagnosis not present

## 2024-05-22 DIAGNOSIS — M5418 Radiculopathy, sacral and sacrococcygeal region: Secondary | ICD-10-CM | POA: Diagnosis not present

## 2024-05-22 DIAGNOSIS — J329 Chronic sinusitis, unspecified: Secondary | ICD-10-CM | POA: Diagnosis not present

## 2024-05-22 DIAGNOSIS — K219 Gastro-esophageal reflux disease without esophagitis: Secondary | ICD-10-CM | POA: Diagnosis not present

## 2024-05-22 DIAGNOSIS — M5126 Other intervertebral disc displacement, lumbar region: Secondary | ICD-10-CM | POA: Diagnosis not present

## 2024-05-24 ENCOUNTER — Other Ambulatory Visit (HOSPITAL_COMMUNITY): Payer: Self-pay

## 2024-05-24 ENCOUNTER — Other Ambulatory Visit: Payer: Self-pay

## 2024-05-24 DIAGNOSIS — I10 Essential (primary) hypertension: Secondary | ICD-10-CM | POA: Diagnosis not present

## 2024-05-24 DIAGNOSIS — T8452XD Infection and inflammatory reaction due to internal left hip prosthesis, subsequent encounter: Secondary | ICD-10-CM | POA: Diagnosis not present

## 2024-05-24 DIAGNOSIS — J329 Chronic sinusitis, unspecified: Secondary | ICD-10-CM | POA: Diagnosis not present

## 2024-05-24 DIAGNOSIS — G894 Chronic pain syndrome: Secondary | ICD-10-CM | POA: Diagnosis not present

## 2024-05-24 DIAGNOSIS — R7303 Prediabetes: Secondary | ICD-10-CM | POA: Diagnosis not present

## 2024-05-24 DIAGNOSIS — G709 Myoneural disorder, unspecified: Secondary | ICD-10-CM | POA: Diagnosis not present

## 2024-05-24 DIAGNOSIS — K219 Gastro-esophageal reflux disease without esophagitis: Secondary | ICD-10-CM | POA: Diagnosis not present

## 2024-05-24 DIAGNOSIS — M4726 Other spondylosis with radiculopathy, lumbar region: Secondary | ICD-10-CM | POA: Diagnosis not present

## 2024-05-24 DIAGNOSIS — M179 Osteoarthritis of knee, unspecified: Secondary | ICD-10-CM | POA: Diagnosis not present

## 2024-05-24 DIAGNOSIS — J302 Other seasonal allergic rhinitis: Secondary | ICD-10-CM | POA: Diagnosis not present

## 2024-05-24 DIAGNOSIS — Z96653 Presence of artificial knee joint, bilateral: Secondary | ICD-10-CM | POA: Diagnosis not present

## 2024-05-24 DIAGNOSIS — Z556 Problems related to health literacy: Secondary | ICD-10-CM | POA: Diagnosis not present

## 2024-05-24 DIAGNOSIS — G4700 Insomnia, unspecified: Secondary | ICD-10-CM | POA: Diagnosis not present

## 2024-05-24 DIAGNOSIS — Z86718 Personal history of other venous thrombosis and embolism: Secondary | ICD-10-CM | POA: Diagnosis not present

## 2024-05-24 DIAGNOSIS — M5418 Radiculopathy, sacral and sacrococcygeal region: Secondary | ICD-10-CM | POA: Diagnosis not present

## 2024-05-24 DIAGNOSIS — M5126 Other intervertebral disc displacement, lumbar region: Secondary | ICD-10-CM | POA: Diagnosis not present

## 2024-05-24 DIAGNOSIS — E559 Vitamin D deficiency, unspecified: Secondary | ICD-10-CM | POA: Diagnosis not present

## 2024-05-24 DIAGNOSIS — T84091D Other mechanical complication of internal left hip prosthesis, subsequent encounter: Secondary | ICD-10-CM | POA: Diagnosis not present

## 2024-05-24 DIAGNOSIS — M47817 Spondylosis without myelopathy or radiculopathy, lumbosacral region: Secondary | ICD-10-CM | POA: Diagnosis not present

## 2024-05-24 DIAGNOSIS — E785 Hyperlipidemia, unspecified: Secondary | ICD-10-CM | POA: Diagnosis not present

## 2024-05-24 MED ORDER — OXYCODONE-ACETAMINOPHEN 10-325 MG PO TABS
1.0000 | ORAL_TABLET | Freq: Four times a day (QID) | ORAL | 0 refills | Status: DC
  Filled 2024-07-05: qty 120, 30d supply, fill #0

## 2024-05-24 MED ORDER — MOVANTIK 25 MG PO TABS
25.0000 mg | ORAL_TABLET | Freq: Every day | ORAL | 2 refills | Status: AC
  Filled 2024-05-24 – 2024-06-05 (×2): qty 30, 30d supply, fill #0

## 2024-05-24 MED ORDER — LIDOCAINE 5 % EX PTCH
3.0000 | MEDICATED_PATCH | CUTANEOUS | 0 refills | Status: DC
  Filled 2024-05-24 – 2024-06-05 (×2): qty 90, 30d supply, fill #0

## 2024-05-24 MED ORDER — PREGABALIN 75 MG PO CAPS
75.0000 mg | ORAL_CAPSULE | Freq: Three times a day (TID) | ORAL | 0 refills | Status: DC
  Filled 2024-05-24 – 2024-06-05 (×2): qty 270, 90d supply, fill #0

## 2024-05-24 MED ORDER — BACLOFEN 10 MG PO TABS
10.0000 mg | ORAL_TABLET | Freq: Two times a day (BID) | ORAL | 0 refills | Status: DC
  Filled 2024-05-24 – 2024-06-05 (×2): qty 180, 90d supply, fill #0

## 2024-05-24 MED ORDER — OXYCODONE-ACETAMINOPHEN 10-325 MG PO TABS
1.0000 | ORAL_TABLET | Freq: Four times a day (QID) | ORAL | 0 refills | Status: DC
  Filled 2024-06-05: qty 120, 30d supply, fill #0

## 2024-05-27 ENCOUNTER — Encounter: Payer: Self-pay | Admitting: Infectious Diseases

## 2024-05-27 DIAGNOSIS — B379 Candidiasis, unspecified: Secondary | ICD-10-CM

## 2024-05-28 DIAGNOSIS — R410 Disorientation, unspecified: Secondary | ICD-10-CM | POA: Diagnosis not present

## 2024-05-28 DIAGNOSIS — Z96641 Presence of right artificial hip joint: Secondary | ICD-10-CM | POA: Diagnosis not present

## 2024-05-28 DIAGNOSIS — Z742 Need for assistance at home and no other household member able to render care: Secondary | ICD-10-CM | POA: Diagnosis not present

## 2024-05-28 MED ORDER — FLUCONAZOLE 150 MG PO TABS: ORAL_TABLET | ORAL | 0 refills | Status: DC

## 2024-05-28 NOTE — Addendum Note (Signed)
 Addended by: FLORENE BOUCHARD D on: 05/28/2024 10:27 AM   Modules accepted: Orders

## 2024-05-28 NOTE — Telephone Encounter (Signed)
 Patient called requesting RX for fluconazole  to [facility] [address]   Selisa Tensley SHAUNNA Letters, CMA

## 2024-05-29 DIAGNOSIS — J302 Other seasonal allergic rhinitis: Secondary | ICD-10-CM | POA: Diagnosis not present

## 2024-05-29 DIAGNOSIS — T84091D Other mechanical complication of internal left hip prosthesis, subsequent encounter: Secondary | ICD-10-CM | POA: Diagnosis not present

## 2024-05-29 DIAGNOSIS — E559 Vitamin D deficiency, unspecified: Secondary | ICD-10-CM | POA: Diagnosis not present

## 2024-05-29 DIAGNOSIS — G709 Myoneural disorder, unspecified: Secondary | ICD-10-CM | POA: Diagnosis not present

## 2024-05-29 DIAGNOSIS — G894 Chronic pain syndrome: Secondary | ICD-10-CM | POA: Diagnosis not present

## 2024-05-29 DIAGNOSIS — I10 Essential (primary) hypertension: Secondary | ICD-10-CM | POA: Diagnosis not present

## 2024-05-29 DIAGNOSIS — M5126 Other intervertebral disc displacement, lumbar region: Secondary | ICD-10-CM | POA: Diagnosis not present

## 2024-05-29 DIAGNOSIS — T8452XD Infection and inflammatory reaction due to internal left hip prosthesis, subsequent encounter: Secondary | ICD-10-CM | POA: Diagnosis not present

## 2024-05-29 DIAGNOSIS — Z96653 Presence of artificial knee joint, bilateral: Secondary | ICD-10-CM | POA: Diagnosis not present

## 2024-05-29 DIAGNOSIS — M4726 Other spondylosis with radiculopathy, lumbar region: Secondary | ICD-10-CM | POA: Diagnosis not present

## 2024-05-29 DIAGNOSIS — M5418 Radiculopathy, sacral and sacrococcygeal region: Secondary | ICD-10-CM | POA: Diagnosis not present

## 2024-05-29 DIAGNOSIS — R7303 Prediabetes: Secondary | ICD-10-CM | POA: Diagnosis not present

## 2024-05-29 DIAGNOSIS — E785 Hyperlipidemia, unspecified: Secondary | ICD-10-CM | POA: Diagnosis not present

## 2024-05-29 DIAGNOSIS — Z556 Problems related to health literacy: Secondary | ICD-10-CM | POA: Diagnosis not present

## 2024-05-29 DIAGNOSIS — K219 Gastro-esophageal reflux disease without esophagitis: Secondary | ICD-10-CM | POA: Diagnosis not present

## 2024-05-29 DIAGNOSIS — J329 Chronic sinusitis, unspecified: Secondary | ICD-10-CM | POA: Diagnosis not present

## 2024-05-29 DIAGNOSIS — Z86718 Personal history of other venous thrombosis and embolism: Secondary | ICD-10-CM | POA: Diagnosis not present

## 2024-05-29 DIAGNOSIS — M179 Osteoarthritis of knee, unspecified: Secondary | ICD-10-CM | POA: Diagnosis not present

## 2024-05-29 DIAGNOSIS — G4700 Insomnia, unspecified: Secondary | ICD-10-CM | POA: Diagnosis not present

## 2024-05-30 DIAGNOSIS — Z86718 Personal history of other venous thrombosis and embolism: Secondary | ICD-10-CM | POA: Diagnosis not present

## 2024-05-30 DIAGNOSIS — E559 Vitamin D deficiency, unspecified: Secondary | ICD-10-CM | POA: Diagnosis not present

## 2024-05-30 DIAGNOSIS — I10 Essential (primary) hypertension: Secondary | ICD-10-CM | POA: Diagnosis not present

## 2024-05-30 DIAGNOSIS — J302 Other seasonal allergic rhinitis: Secondary | ICD-10-CM | POA: Diagnosis not present

## 2024-05-30 DIAGNOSIS — E785 Hyperlipidemia, unspecified: Secondary | ICD-10-CM | POA: Diagnosis not present

## 2024-05-30 DIAGNOSIS — Z556 Problems related to health literacy: Secondary | ICD-10-CM | POA: Diagnosis not present

## 2024-05-30 DIAGNOSIS — G4700 Insomnia, unspecified: Secondary | ICD-10-CM | POA: Diagnosis not present

## 2024-05-30 DIAGNOSIS — M179 Osteoarthritis of knee, unspecified: Secondary | ICD-10-CM | POA: Diagnosis not present

## 2024-05-30 DIAGNOSIS — M5126 Other intervertebral disc displacement, lumbar region: Secondary | ICD-10-CM | POA: Diagnosis not present

## 2024-05-30 DIAGNOSIS — M4726 Other spondylosis with radiculopathy, lumbar region: Secondary | ICD-10-CM | POA: Diagnosis not present

## 2024-05-30 DIAGNOSIS — M5418 Radiculopathy, sacral and sacrococcygeal region: Secondary | ICD-10-CM | POA: Diagnosis not present

## 2024-05-30 DIAGNOSIS — T84091D Other mechanical complication of internal left hip prosthesis, subsequent encounter: Secondary | ICD-10-CM | POA: Diagnosis not present

## 2024-05-30 DIAGNOSIS — Z96653 Presence of artificial knee joint, bilateral: Secondary | ICD-10-CM | POA: Diagnosis not present

## 2024-05-30 DIAGNOSIS — K219 Gastro-esophageal reflux disease without esophagitis: Secondary | ICD-10-CM | POA: Diagnosis not present

## 2024-05-30 DIAGNOSIS — J329 Chronic sinusitis, unspecified: Secondary | ICD-10-CM | POA: Diagnosis not present

## 2024-05-30 DIAGNOSIS — T8452XD Infection and inflammatory reaction due to internal left hip prosthesis, subsequent encounter: Secondary | ICD-10-CM | POA: Diagnosis not present

## 2024-05-30 DIAGNOSIS — G709 Myoneural disorder, unspecified: Secondary | ICD-10-CM | POA: Diagnosis not present

## 2024-05-30 DIAGNOSIS — G894 Chronic pain syndrome: Secondary | ICD-10-CM | POA: Diagnosis not present

## 2024-05-31 DIAGNOSIS — G709 Myoneural disorder, unspecified: Secondary | ICD-10-CM | POA: Diagnosis not present

## 2024-05-31 DIAGNOSIS — M5418 Radiculopathy, sacral and sacrococcygeal region: Secondary | ICD-10-CM | POA: Diagnosis not present

## 2024-05-31 DIAGNOSIS — Z96653 Presence of artificial knee joint, bilateral: Secondary | ICD-10-CM | POA: Diagnosis not present

## 2024-05-31 DIAGNOSIS — M4726 Other spondylosis with radiculopathy, lumbar region: Secondary | ICD-10-CM | POA: Diagnosis not present

## 2024-05-31 DIAGNOSIS — R7303 Prediabetes: Secondary | ICD-10-CM | POA: Diagnosis not present

## 2024-05-31 DIAGNOSIS — M179 Osteoarthritis of knee, unspecified: Secondary | ICD-10-CM | POA: Diagnosis not present

## 2024-05-31 DIAGNOSIS — J302 Other seasonal allergic rhinitis: Secondary | ICD-10-CM | POA: Diagnosis not present

## 2024-05-31 DIAGNOSIS — Z556 Problems related to health literacy: Secondary | ICD-10-CM | POA: Diagnosis not present

## 2024-05-31 DIAGNOSIS — E785 Hyperlipidemia, unspecified: Secondary | ICD-10-CM | POA: Diagnosis not present

## 2024-05-31 DIAGNOSIS — M5126 Other intervertebral disc displacement, lumbar region: Secondary | ICD-10-CM | POA: Diagnosis not present

## 2024-05-31 DIAGNOSIS — Z86718 Personal history of other venous thrombosis and embolism: Secondary | ICD-10-CM | POA: Diagnosis not present

## 2024-05-31 DIAGNOSIS — I10 Essential (primary) hypertension: Secondary | ICD-10-CM | POA: Diagnosis not present

## 2024-05-31 DIAGNOSIS — G894 Chronic pain syndrome: Secondary | ICD-10-CM | POA: Diagnosis not present

## 2024-05-31 DIAGNOSIS — K219 Gastro-esophageal reflux disease without esophagitis: Secondary | ICD-10-CM | POA: Diagnosis not present

## 2024-05-31 DIAGNOSIS — G4700 Insomnia, unspecified: Secondary | ICD-10-CM | POA: Diagnosis not present

## 2024-05-31 DIAGNOSIS — T84091D Other mechanical complication of internal left hip prosthesis, subsequent encounter: Secondary | ICD-10-CM | POA: Diagnosis not present

## 2024-05-31 DIAGNOSIS — T8452XD Infection and inflammatory reaction due to internal left hip prosthesis, subsequent encounter: Secondary | ICD-10-CM | POA: Diagnosis not present

## 2024-05-31 DIAGNOSIS — E559 Vitamin D deficiency, unspecified: Secondary | ICD-10-CM | POA: Diagnosis not present

## 2024-06-01 ENCOUNTER — Other Ambulatory Visit (HOSPITAL_COMMUNITY): Payer: Self-pay

## 2024-06-01 DIAGNOSIS — T8452XD Infection and inflammatory reaction due to internal left hip prosthesis, subsequent encounter: Secondary | ICD-10-CM | POA: Diagnosis not present

## 2024-06-01 DIAGNOSIS — Z96653 Presence of artificial knee joint, bilateral: Secondary | ICD-10-CM | POA: Diagnosis not present

## 2024-06-01 DIAGNOSIS — Z556 Problems related to health literacy: Secondary | ICD-10-CM | POA: Diagnosis not present

## 2024-06-01 DIAGNOSIS — M179 Osteoarthritis of knee, unspecified: Secondary | ICD-10-CM | POA: Diagnosis not present

## 2024-06-01 DIAGNOSIS — M5126 Other intervertebral disc displacement, lumbar region: Secondary | ICD-10-CM | POA: Diagnosis not present

## 2024-06-01 DIAGNOSIS — G894 Chronic pain syndrome: Secondary | ICD-10-CM | POA: Diagnosis not present

## 2024-06-01 DIAGNOSIS — M5418 Radiculopathy, sacral and sacrococcygeal region: Secondary | ICD-10-CM | POA: Diagnosis not present

## 2024-06-01 DIAGNOSIS — R7303 Prediabetes: Secondary | ICD-10-CM | POA: Diagnosis not present

## 2024-06-01 DIAGNOSIS — J302 Other seasonal allergic rhinitis: Secondary | ICD-10-CM | POA: Diagnosis not present

## 2024-06-01 DIAGNOSIS — G709 Myoneural disorder, unspecified: Secondary | ICD-10-CM | POA: Diagnosis not present

## 2024-06-01 DIAGNOSIS — Z86718 Personal history of other venous thrombosis and embolism: Secondary | ICD-10-CM | POA: Diagnosis not present

## 2024-06-01 DIAGNOSIS — E559 Vitamin D deficiency, unspecified: Secondary | ICD-10-CM | POA: Diagnosis not present

## 2024-06-01 DIAGNOSIS — M4726 Other spondylosis with radiculopathy, lumbar region: Secondary | ICD-10-CM | POA: Diagnosis not present

## 2024-06-01 DIAGNOSIS — I10 Essential (primary) hypertension: Secondary | ICD-10-CM | POA: Diagnosis not present

## 2024-06-01 DIAGNOSIS — E785 Hyperlipidemia, unspecified: Secondary | ICD-10-CM | POA: Diagnosis not present

## 2024-06-01 DIAGNOSIS — J329 Chronic sinusitis, unspecified: Secondary | ICD-10-CM | POA: Diagnosis not present

## 2024-06-01 DIAGNOSIS — T84091D Other mechanical complication of internal left hip prosthesis, subsequent encounter: Secondary | ICD-10-CM | POA: Diagnosis not present

## 2024-06-01 DIAGNOSIS — G4700 Insomnia, unspecified: Secondary | ICD-10-CM | POA: Diagnosis not present

## 2024-06-03 ENCOUNTER — Other Ambulatory Visit (HOSPITAL_COMMUNITY): Payer: Self-pay

## 2024-06-05 ENCOUNTER — Other Ambulatory Visit: Payer: Self-pay

## 2024-06-05 ENCOUNTER — Ambulatory Visit (INDEPENDENT_AMBULATORY_CARE_PROVIDER_SITE_OTHER): Admitting: Infectious Diseases

## 2024-06-05 ENCOUNTER — Other Ambulatory Visit (HOSPITAL_COMMUNITY): Payer: Self-pay

## 2024-06-05 VITALS — BP 124/83 | HR 83 | Temp 98.6°F | Wt 246.6 lb

## 2024-06-05 DIAGNOSIS — J302 Other seasonal allergic rhinitis: Secondary | ICD-10-CM | POA: Diagnosis not present

## 2024-06-05 DIAGNOSIS — R7303 Prediabetes: Secondary | ICD-10-CM | POA: Diagnosis not present

## 2024-06-05 DIAGNOSIS — T8450XD Infection and inflammatory reaction due to unspecified internal joint prosthesis, subsequent encounter: Secondary | ICD-10-CM | POA: Diagnosis not present

## 2024-06-05 DIAGNOSIS — Z5181 Encounter for therapeutic drug level monitoring: Secondary | ICD-10-CM | POA: Diagnosis not present

## 2024-06-05 DIAGNOSIS — M4726 Other spondylosis with radiculopathy, lumbar region: Secondary | ICD-10-CM | POA: Diagnosis not present

## 2024-06-05 DIAGNOSIS — E559 Vitamin D deficiency, unspecified: Secondary | ICD-10-CM | POA: Diagnosis not present

## 2024-06-05 DIAGNOSIS — T84091D Other mechanical complication of internal left hip prosthesis, subsequent encounter: Secondary | ICD-10-CM | POA: Diagnosis not present

## 2024-06-05 DIAGNOSIS — M5126 Other intervertebral disc displacement, lumbar region: Secondary | ICD-10-CM | POA: Diagnosis not present

## 2024-06-05 DIAGNOSIS — G709 Myoneural disorder, unspecified: Secondary | ICD-10-CM | POA: Diagnosis not present

## 2024-06-05 DIAGNOSIS — G4700 Insomnia, unspecified: Secondary | ICD-10-CM | POA: Diagnosis not present

## 2024-06-05 DIAGNOSIS — Z556 Problems related to health literacy: Secondary | ICD-10-CM | POA: Diagnosis not present

## 2024-06-05 DIAGNOSIS — Z86718 Personal history of other venous thrombosis and embolism: Secondary | ICD-10-CM | POA: Diagnosis not present

## 2024-06-05 DIAGNOSIS — K219 Gastro-esophageal reflux disease without esophagitis: Secondary | ICD-10-CM | POA: Diagnosis not present

## 2024-06-05 DIAGNOSIS — M5418 Radiculopathy, sacral and sacrococcygeal region: Secondary | ICD-10-CM | POA: Diagnosis not present

## 2024-06-05 DIAGNOSIS — E785 Hyperlipidemia, unspecified: Secondary | ICD-10-CM | POA: Diagnosis not present

## 2024-06-05 DIAGNOSIS — T8452XD Infection and inflammatory reaction due to internal left hip prosthesis, subsequent encounter: Secondary | ICD-10-CM | POA: Diagnosis not present

## 2024-06-05 DIAGNOSIS — J329 Chronic sinusitis, unspecified: Secondary | ICD-10-CM | POA: Diagnosis not present

## 2024-06-05 DIAGNOSIS — G894 Chronic pain syndrome: Secondary | ICD-10-CM | POA: Diagnosis not present

## 2024-06-05 DIAGNOSIS — I10 Essential (primary) hypertension: Secondary | ICD-10-CM | POA: Diagnosis not present

## 2024-06-05 DIAGNOSIS — Z96653 Presence of artificial knee joint, bilateral: Secondary | ICD-10-CM | POA: Diagnosis not present

## 2024-06-05 DIAGNOSIS — M179 Osteoarthritis of knee, unspecified: Secondary | ICD-10-CM | POA: Diagnosis not present

## 2024-06-05 NOTE — Progress Notes (Addendum)
 Patient Active Problem List   Diagnosis Date Noted   Prosthetic joint infection (HCC) 05/08/2024   PICC (peripherally inserted central catheter) in place 05/08/2024   Medication monitoring encounter 05/08/2024   Delirium 04/10/2024   Acute encephalopathy 04/08/2024   Thrombocytopenia (HCC) 04/08/2024   Normocytic anemia 04/08/2024   Failed total hip arthroplasty (HCC) 04/04/2024   Bilateral leg edema 01/09/2024   Chronic pain syndrome 01/09/2024   Hyperlipidemia 12/22/2023   Seasonal allergies 12/22/2023   Vitamin D  deficiency 11/21/2023   Prediabetes 11/03/2023   Allergic rhinitis 01/29/2022   Allergic rhinitis due to animal hair and dander 01/29/2022   Allergic rhinitis due to pollen 01/29/2022   Chronic allergic conjunctivitis 01/29/2022   Seafood allergy 01/29/2022   History of endometrial ablation 07/24/2021   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   PCOS (polycystic ovarian syndrome) 04/02/2021   Depressive disorder 04/02/2021   Generalized OA 04/02/2021   Postoperative pain 04/02/2021   Acute blood loss anemia 04/02/2021   Neuropathic pain 04/02/2021   Therapeutic opioid induced constipation 04/02/2021   Urinary retention 04/02/2021   Fall 04/02/2021   Labile blood pressure 04/02/2021   Pain 04/02/2021   Generalized anxiety disorder 04/02/2021   Obesity 04/02/2021   Body mass index (BMI) 40.0-44.9, adult (HCC) 12/31/2020   Flat foot 05/07/2019   Abnormality of gait 02/22/2019   Herpes simplex 01/18/2019   Lumbosacral radiculopathy at L4 06/09/2018   Paresis of single lower extremity (HCC) 04/26/2018   Lumbar radiculopathy 02/21/2018   Status post surgery 02/17/2018   Hip pain 01/05/2018   Radiculopathy, lumbar region 08/10/2017   Lumbar spondylosis 08/10/2017   Herniated nucleus pulposus, lumbar 05/19/2016   Primary localized osteoarthritis of right hip 04/21/2016   Sinusitis 06/25/2015   Other specified cough 06/25/2015   Essential hypertension  04/03/2015   Dehydration 04/03/2015   CAP (community acquired pneumonia) 03/31/2015   Right lower lobe pneumonia 03/31/2015   Fever 03/31/2015   Pregnancy 04/29/2014   Miscarriage    Incomplete miscarriage 04/02/2011    Class: Acute   Deep venous thrombosis (HCC) 09/20/2010    Class: Chronic    Patient's Medications  New Prescriptions   No medications on file  Previous Medications   ALBUTEROL  SULFATE (PROAIR  RESPICLICK) 108 (90 BASE) MCG/ACT AEPB    Inhale 1 puff into the lungs every 4 (four) hours as needed.   APIXABAN  (ELIQUIS ) 2.5 MG TABS TABLET    Take 1 tablet (2.5 mg total) by mouth 2 (two) times daily.   BACLOFEN  (LIORESAL ) 10 MG TABLET    Take 1 tablet by mouth twice a day   BACLOFEN  (LIORESAL ) 10 MG TABLET    Take 1 tablet by mouth twice a day   BUPROPION  (WELLBUTRIN  XL) 150 MG 24 HR TABLET    Take 150 mg by mouth daily.   CALCIUM  CARBONATE (TUMS EX) 750 MG CHEWABLE TABLET    Chew 2 tablets by mouth daily.   CARVEDILOL  (COREG ) 6.25 MG TABLET    Take 6.25 mg by mouth 2 (two) times daily with a meal.   DOCUSATE SODIUM  (COLACE) 100 MG CAPSULE    Take 200 mg by mouth daily as needed for mild constipation.   EPINEPHRINE  0.3 MG/0.3 ML IJ SOAJ INJECTION    Inject 3 mLs into the muscle once.   FLUCONAZOLE  (DIFLUCAN ) 150 MG TABLET    Take one tablet (150 mg) by mouth. Repeat in 48-72 hours as needed.   FUROSEMIDE  (LASIX ) 20 MG TABLET  Take 20 mg by mouth daily as needed for edema.   LEVOCETIRIZINE (XYZAL ALLERGY 24HR) 5 MG TABLET    Take 5 mg by mouth daily as needed for allergies.   LIDOCAINE  (LIDODERM ) 5 %    Apply 3 patches to skin once daily as directed-remove after 12 hours   LIDOCAINE  (LIDODERM ) 5 %    Apply 3 patches to skin as directed   LISDEXAMFETAMINE (VYVANSE ) 50 MG CAPSULE    Take 50 mg by mouth daily.   LOSARTAN -HYDROCHLOROTHIAZIDE  (HYZAAR) 100-12.5 MG TABLET    Take 1 tablet by mouth daily.   MULTIPLE VITAMINS-MINERALS (BARIATRIC MULTIVITAMINS/IRON PO)    Take 1  tablet by mouth daily.   NALOXEGOL  OXALATE (MOVANTIK ) 25 MG TABS TABLET    Take 1 tablet (25 mg total) by mouth daily.   OMEPRAZOLE  (PRILOSEC) 40 MG CAPSULE    Take 1 capsule (40 mg total) by mouth daily for 21 days.   OXYCODONE -ACETAMINOPHEN  (PERCOCET) 10-325 MG TABLET    Take 1 tablet by mouth every 6 (six) hours as directed   OXYCODONE -ACETAMINOPHEN  (PERCOCET) 10-325 MG TABLET    Take 1 tablet by mouth every 6 (six) hours as directed 06/21/24   OXYCODONE -ACETAMINOPHEN  (PERCOCET) 10-325 MG TABLET    Take 1 tablet by mouth every 6 (six) hours as directed   POLYETHYLENE GLYCOL (MIRALAX ) 17 G PACKET    Take 17 g by mouth daily.   PREGABALIN  (LYRICA ) 75 MG CAPSULE    Take 1 capsule by mouth three times a day   PREGABALIN  (LYRICA ) 75 MG CAPSULE    Take 1 capsule (75 mg total) by mouth 3 (three) times daily.   ROSUVASTATIN  (CRESTOR ) 10 MG TABLET    Take 1 tablet (10 mg total) by mouth daily.   VITAMIN D -VITAMIN K (K2 PLUS D3 PO)    Take 2 capsules by mouth daily.   ZOLPIDEM  (AMBIEN ) 10 MG TABLET    Take 10 mg by mouth at bedtime as needed for sleep.  Modified Medications   No medications on file  Discontinued Medications   No medications on file    Subjective: Discussed the use of AI scribe software for clinical note transcription with the patient, who gave verbal consent to proceed.   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 Traumatic degeneration s/p b/l knee arthroplasty, rt hip arthroplasty, lower back decompression surgery, chronic rt hip pain with acute worsening who is here for HFU after recent admission in July for semi elective 2 stage prosthetic joint replacement. Office aspirate 10K WBC with 90% N, Cx negative, not enough sample to send for synovosure.   Underwent 7/16 excision arthroplasty, cx with GPC in gram stain. Cx no growth on multiple samples. Post operative course complicated by AMS which resolved and discharged on 7/30 to complete 6 weeks of IV daptomycin   through 8/28.   8/19 Accompanied by mother and another family member. She come from SNF. Verified receiving IV daptomycin  from [facility]. Denies any concerns at PICC. She experiences mild tenderness at rt hip but no significant pain, swelling or warmth. Denies fevers, chills. Denies nausea, vomiting, diarrhea.   She is unable to ambulate without a walker and is receiving physical therapy at the SNF. Saw Dr Edna last week and plan to aspirate hip in 2 weeks after antibiotics completed.   She reports that her PICC line dressing was not changed for almost two weeks in the SNF, and she has issues with the tape coming off. She inquires  about obtaining caps for the PICC line due to discomfort with the current setup. Discussed that no PICC supplies at the office and she will need to get it from the SNF. No other complaints.   9/16 Completed IV antibiotics on 8/28. PICC removed. She is back to home from SNF. Saw Ortho and planned for hip aspiration on 9/18. No other concerns.   Review of Systems: all systems reviewed with pertinent positives and negatives as listed above   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   Complication of anesthesia    Depression    History of pneumonia    Hypertension    Infertility, female    Joint pain    Lumbar spondylosis    Neuromuscular disease (HCC)    numbness, paresthesias RLE, from failed lumbar back surgery   Osteoarthritis    PCOS (polycystic ovarian syndrome)    Pneumonia    2016   PONV (postoperative nausea and vomiting)    Pre-diabetes    Swelling of lower extremity    Past Surgical History:  Procedure Laterality Date   ABDOMINAL EXPOSURE N/A 02/17/2018   Procedure: ABDOMINAL EXPOSURE;  Surgeon: Oris Krystal FALCON, MD;  Location: [facility] OR;  Service: Vascular;  Laterality: N/A;   ANKLE SURGERY  Bilateral    x 2   ANTERIOR LAT LUMBAR FUSION N/A 02/17/2018   Procedure: Lumbar three-four Lumbar four-five  Anterolateral lumbar interbody fusion;  Surgeon: Colon Shove, MD;  Location: [facility] OR;  Service: Neurosurgery;  Laterality: N/A;   ANTERIOR LUMBAR FUSION N/A 02/17/2018   Procedure: Lumbar five-Sacral one Anterior lumbar interbody fusion;  Surgeon: Colon Shove, MD;  Location: [facility] OR;  Service: Neurosurgery;  Laterality: N/A;   APPLICATION OF ROBOTIC ASSISTANCE FOR SPINAL PROCEDURE N/A 02/17/2018   Procedure: APPLICATION OF ROBOTIC ASSISTANCE FOR SPINAL PROCEDURE;  Surgeon: Colon Shove, MD;  Location: [facility] OR;  Service: Neurosurgery;  Laterality: N/A;   APPLICATION OF ROBOTIC ASSISTANCE FOR SPINAL PROCEDURE N/A 06/09/2018   Procedure: APPLICATION OF ROBOTIC ASSISTANCE FOR SPINAL PROCEDURE;  Surgeon: Colon Shove, MD;  Location: [facility] OR;  Service: Neurosurgery;  Laterality: N/A;   BACK SURGERY     2017  discectomy   CARPAL TUNNEL RELEASE Left 07/04/2014   Procedure: LEFT CARPAL TUNNEL RELEASE;  Surgeon: Toribio FALCON Chancy, MD;  Location: [facility];  Service: Orthopedics;  Laterality: Left;   CARPAL TUNNEL RELEASE Right 08/08/2014   Procedure: RIGHT CARPAL TUNNEL RELEASE;  Surgeon: Toribio FALCON Chancy, MD;  Location: [facility];  Service: Orthopedics;  Laterality: Right;   COLONOSCOPY  2024   DILATION AND CURETTAGE OF UTERUS     DILATION AND EVACUATION  04/02/2011   Procedure: DILATATION AND EVACUATION (D&E);  Surgeon: Alm JAYSON Cook, MD;  Location: [facility];  Service: Gynecology;  Laterality: N/A;  dvt left mid thigh   DORSAL COMPARTMENT RELEASE Left 07/04/2014   Procedure: LEFT DEQUERVAINS;  Surgeon: Toribio FALCON Chancy, MD;  Location: [facility];  Service: Orthopedics;  Laterality: Left;   ENDOSCOPIC PLANTAR FASCIOTOMY Left 02/01/2002   HARDWARE REMOVAL Right 06/09/2018   Procedure: Repositioning of Right Lumbar three Right lumbar  four pedicle screw with  MAZOR;  Surgeon: Colon Shove, MD;  Location: [facility] OR;  Service: Neurosurgery;  Laterality: Right;  HYSTEROSCOPY WITH D & C  07/17/2010   with exc. endometrial polyps   KNEE ARTHROSCOPY Right 03/07/2003; 01/14/2005; 08/31/2006   KNEE ARTHROSCOPY Left 03/21/2003; 11/26/2004   LUMBAR PERCUTANEOUS PEDICLE SCREW 3 LEVEL N/A 02/17/2018   Procedure: LUMBAR PERCUTANEOUS PEDICLE SCREW PLACEMENT LUMBAR THREE-SACRAL ONE;  Surgeon: Colon Shove, MD;  Location: [facility];  Service: Neurosurgery;  Laterality: N/A;   SHOULDER ARTHROSCOPY Right    TOE SURGERY Left 01/10/2003   claw toe correction 2nd, 3rd, 4th toes   TOTAL HIP ARTHROPLASTY Right 04/21/2016   Procedure: TOTAL HIP ARTHROPLASTY ANTERIOR APPROACH;  Surgeon: Toribio JULIANNA Chancy, MD;  Location: [facility] OR;  Service: Orthopedics;  Laterality: Right;   TOTAL HIP ARTHROPLASTY WITH HARDWARE REMOVAL Right 04/04/2024   Procedure: REVISION, ARTHROPLASTY, HIP;  Surgeon: Edna Toribio LABOR, MD;  Location: [facility];  Service: Orthopedics;  Laterality: Right;   TOTAL KNEE ARTHROPLASTY Left 01/21/2010   TOTAL KNEE ARTHROPLASTY Right 08/26/2008   UPPER GI ENDOSCOPY N/A 04/14/2021   Procedure: UPPER GI ENDOSCOPY;  Surgeon: Gladis Cough, MD;  Location: [facility];  Service: General;  Laterality: N/A;    Social History   Tobacco Use   Smoking status: Never   Smokeless tobacco: Never  Vaping Use   Vaping status: Never Used  Substance Use Topics   Alcohol  use: No    Alcohol /week: 0.0 standard drinks of alcohol    Drug use: No    Family History  Problem Relation Age of Onset   Diabetes Father    Cancer Father        COLON   Heart disease Father    Kidney disease Father        dialysis   High blood pressure Father    Heart disease Sister    Diabetes Sister    Heart failure Sister    Heart failure Sister    Heart disease Sister    Cancer Maternal Grandmother        UTERINE   Cancer Paternal Grandmother        breast   Breast cancer Paternal Grandmother     Breast cancer Cousin     Allergies  Allergen Reactions   Gadolinium Derivatives Nausea And Vomiting and Other (See Comments)    Pt describes this happens every time she gets gado even with slow injection    Codeine Itching, Nausea Only and Other (See Comments)    Tolerates oxycodone     Duloxetine     altered mental status    Health Maintenance  Topic Date Due   Hepatitis C Screening  Never done   Hepatitis B Vaccines 19-59 Average Risk (1 of 3 - 19+ 3-dose series) Never done   Cervical Cancer Screening (HPV/Pap Cotest)  06/11/2017   Medicare Annual Wellness (AWV)  02/16/2024   Influenza Vaccine  04/20/2024   COVID-19 Vaccine (5 - 2025-26 season) 05/21/2024   Mammogram  01/24/2026   Colonoscopy  10/22/2031   DTaP/Tdap/Td (2 - Td or Tdap) 02/09/2034   HIV Screening  Completed   Pneumococcal Vaccine  Aged Out   HPV VACCINES  Aged Out   Meningococcal B Vaccine  Aged Out    Objective: BP 124/83 (BP Location: Right Arm, Patient Position: Sitting, Cuff Size: Normal)   Pulse 83   Temp 98.6 F (37 C) (Oral)   Wt 246 lb 9.6 oz (111.9 kg)   SpO2 97%   BMI 34.39 kg/m   Physical Exam Constitutional:      Appearance: Normal appearance.  HENT:  Head: Normocephalic and atraumatic.      Mouth: Mucous membranes are moist.  Eyes:    Conjunctiva/sclera: Conjunctivae normal.     Pupils: Pupils are equal, round, and b/l symmetrical    Cardiovascular:     Rate and Rhythm: Normal rate and regular rhythm.     Heart sounds: s1s2  Pulmonary:     Effort: Pulmonary effort is normal.     Breath sounds: Normal breath sounds.   Abdominal:     General: Non distended     Palpations: soft.   Musculoskeletal:        General:sitting in the wheelchair Rt hip surgical site healed with no new signs of infection    Skin:    General: Skin is warm and dry.     Comments:  Neurological:     General: grossly non focal     Mental Status: awake, alert and oriented to person, place, and  time.   Psychiatric:        Mood and Affect: Mood normal.   Lab Results Lab Results  Component Value Date   WBC 8.3 04/17/2024   HGB 9.1 (L) 04/17/2024   HCT 29.2 (L) 04/17/2024   MCV 83.2 04/17/2024   PLT 317 04/17/2024    Lab Results  Component Value Date   CREATININE 0.90 04/17/2024   BUN 14 04/17/2024   NA 140 04/17/2024   K 3.8 04/17/2024   CL 104 04/17/2024   CO2 27 04/17/2024    Lab Results  Component Value Date   ALT 19 04/17/2024   AST 19 04/17/2024   ALKPHOS 64 04/17/2024   BILITOT 0.6 04/17/2024    Lab Results  Component Value Date   CHOL 175 10/21/2023   HDL 58 10/21/2023   LDLCALC 106 (H) 10/21/2023   LDLDIRECT 99 10/21/2023   TRIG 57 10/21/2023   Lab Results  Component Value Date   LABRPR NON REAC 04/29/2014   No results found for: HIV1RNAQUANT, HIV1RNAVL, CD4TABS   Microbiology Results for orders placed or performed during the hospital encounter of 04/04/24  Fungus Culture With Stain     Status: None   Collection Time: 04/04/24 12:38 PM   Specimen: Synovial, Right Hip; Body Fluid  Result Value Ref Range Status   Fungus Stain Final report  Final   Fungus (Mycology) Culture Final report  Final    Comment: (NOTE) Performed At: [facility] [address] [place], [place] [id number] Jennette Shorter MD Ey:[id number]    Fungal Source TISSUE  Final    Comment: RIGHT HIP 1A Performed at [facility], [address], [place], [place] [address]   Aerobic/Anaerobic Culture w Gram Stain (surgical/deep wound)     Status: None   Collection Time: 04/04/24 12:38 PM   Specimen: Synovial, Right Hip; Body Fluid  Result Value Ref Range Status   Specimen Description   Final    TISSUE Performed at [facility], [address] [place], [place] [address]    Special Requests R HIP SAMPLE 1A  Final   Gram Stain NO WBC SEEN NO ORGANISMS SEEN   Final   Culture   Final    No growth aerobically or  anaerobically. Performed at [facility], [address], [place], [place] [address]    Report Status 04/09/2024 FINAL  Final  Fungus Culture Result     Status: None   Collection Time: 04/04/24 12:38 PM  Result Value Ref Range Status   Result 1  Comment  Final    Comment: (NOTE) KOH/Calcofluor preparation:  no fungus observed. Performed At: [facility] [address] [place], [place] [id number] Jennette Shorter MD Ey:[id number]   Fungal organism reflex     Status: None   Collection Time: 04/04/24 12:38 PM  Result Value Ref Range Status   Fungal result 1 Comment  Final    Comment: (NOTE) No yeast or mold isolated after 4 weeks. Performed At: [facility] [address] [place], [place] [id number] Jennette Shorter MD Ey:[id number]   Aerobic/Anaerobic Culture w Gram Stain (surgical/deep wound)     Status: None   Collection Time: 04/04/24 12:46 PM   Specimen: Synovial, Right Hip; Body Fluid  Result Value Ref Range Status   Specimen Description   Final    TISSUE Performed at [facility], [address], [place], [place] [address]    Special Requests R HIP SAMPLE B HOLD 2WKS  Final   Gram Stain NO WBC SEEN NO ORGANISMS SEEN   Final   Culture   Final    No growth aerobically or anaerobically. AFTER 14 DAYS Performed at [facility], [address] [place], [place] [address]    Report Status 04/18/2024 FINAL  Final  Fungus Culture With Stain     Status: None   Collection Time: 04/04/24 12:49 PM   Specimen: Synovial, Right Hip; Body Fluid  Result Value Ref Range Status   Fungus Stain Final report  Final   Fungus (Mycology) Culture Final report  Final    Comment: (NOTE) Performed At: [facility] [address] [place], [place] [id number] Jennette Shorter MD Ey:[id number]    Fungal Source RT HIP SAMPLE C  Corrected    Comment: Performed at [facility], [address] [place], [place] [address] CORRECTED ON 07/16 AT  2115: PREVIOUSLY REPORTED AS TISSUE RIGHT HIP   Aerobic/Anaerobic Culture w Gram Stain (surgical/deep wound)     Status: None   Collection Time: 04/04/24 12:49 PM   Specimen: Synovial, Right Hip; Body Fluid  Result Value Ref Range Status   Specimen Description   Final    TISSUE Performed at [facility], [address], [place], [place] [address]    Special Requests   Final    R HIP Performed at [facility], [address] [place], [place] [address]    Gram Stain NO WBC SEEN RARE GRAM POSITIVE COCCI   Final   Culture   Final    No growth aerobically or anaerobically. AFTER 14 DAYS Performed at [facility], [address] [place], [place] [address]    Report Status 04/18/2024 FINAL  Final  Fungus Culture Result     Status: None   Collection Time: 04/04/24 12:49 PM  Result Value Ref Range Status   Result 1 Comment  Final    Comment: (NOTE) KOH/Calcofluor preparation:  no fungus observed. Performed At: [facility] [address] [place], [place] [id number] Jennette Shorter MD Ey:[id number]   Fungal organism reflex     Status: None   Collection Time: 04/04/24 12:49 PM  Result Value Ref Range Status   Fungal result 1 Comment  Final    Comment: (NOTE) No yeast or mold isolated after 4 weeks. Performed At: [facility] [address] [place], [place] [id number] Jennette Shorter MD Ey:[id number]   Fungus Culture With Stain     Status: None   Collection Time: 04/04/24  1:31 PM  Specimen: Path Tissue  Result Value Ref Range Status   Fungus Stain Final report  Final   Fungus (Mycology) Culture Final report  Final    Comment: (NOTE) Performed At: [facility] [address] [place], [place] [id number] Jennette Shorter MD Ey:[id number]    Fungal Source TISSUE  Final    Comment: RIGHT HIP Performed at [facility], [address], [place], [place] [address]   Aerobic/Anaerobic Culture w  Gram Stain (surgical/deep wound)     Status: None   Collection Time: 04/04/24  1:31 PM   Specimen: Path Tissue  Result Value Ref Range Status   Specimen Description   Final    TISSUE Performed at [facility], [address], [place], [place] [address]    Special Requests R HIP SAMPLE D HOLD 14 DAYSQ  Final   Gram Stain   Final    FEW WBC PRESENT,BOTH PMN AND MONONUCLEAR NO ORGANISMS SEEN    Culture   Final    No growth aerobically or anaerobically. AFTER 14 DAYS Performed at [facility], [address] [place], [place] [address]    Report Status 04/18/2024 FINAL  Final  Fungus Culture Result     Status: None   Collection Time: 04/04/24  1:31 PM  Result Value Ref Range Status   Result 1 Comment  Final    Comment: (NOTE) KOH/Calcofluor preparation:  no fungus observed. Performed At: [facility] [address] [place], [place] [id number] Jennette Shorter MD Ey:[id number]   Fungal organism reflex     Status: None   Collection Time: 04/04/24  1:31 PM  Result Value Ref Range Status   Fungal result 1 Comment  Final    Comment: (NOTE) No yeast or mold isolated after 4 weeks. Performed At: [facility] [address] [place], [place] [id number] Jennette Shorter MD Ey:[id number]    Imaging No results found.  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 Professional time spent includes the following activities: Preparing to see the patient (review of tests),Performing a medically appropriate examination and evaluation, referring and communicating with other  health care professionals including Orthopedics, Documenting clinical information in the EMR, Independently interpreting results (not separately reported), Communicating results to the patient/family member, Counseling and educating the patient/family member and Care coordination (not separately reported).   Of note, portions of this note may have been created with voice recognition software. While this note has been edited for accuracy, occasional wrong-word or 'sound-a-like' substitutions may have occurred due to the inherent limitations of voice recognition software.   Annalee Joseph, MD [facility] 06/05/2024, 11:10 AM

## 2024-06-06 DIAGNOSIS — T8452XD Infection and inflammatory reaction due to internal left hip prosthesis, subsequent encounter: Secondary | ICD-10-CM | POA: Diagnosis not present

## 2024-06-06 DIAGNOSIS — Z96653 Presence of artificial knee joint, bilateral: Secondary | ICD-10-CM | POA: Diagnosis not present

## 2024-06-06 DIAGNOSIS — E785 Hyperlipidemia, unspecified: Secondary | ICD-10-CM | POA: Diagnosis not present

## 2024-06-06 DIAGNOSIS — M5418 Radiculopathy, sacral and sacrococcygeal region: Secondary | ICD-10-CM | POA: Diagnosis not present

## 2024-06-06 DIAGNOSIS — R7303 Prediabetes: Secondary | ICD-10-CM | POA: Diagnosis not present

## 2024-06-06 DIAGNOSIS — J329 Chronic sinusitis, unspecified: Secondary | ICD-10-CM | POA: Diagnosis not present

## 2024-06-06 DIAGNOSIS — K219 Gastro-esophageal reflux disease without esophagitis: Secondary | ICD-10-CM | POA: Diagnosis not present

## 2024-06-06 DIAGNOSIS — G894 Chronic pain syndrome: Secondary | ICD-10-CM | POA: Diagnosis not present

## 2024-06-06 DIAGNOSIS — G4700 Insomnia, unspecified: Secondary | ICD-10-CM | POA: Diagnosis not present

## 2024-06-06 DIAGNOSIS — Z86718 Personal history of other venous thrombosis and embolism: Secondary | ICD-10-CM | POA: Diagnosis not present

## 2024-06-06 DIAGNOSIS — M5126 Other intervertebral disc displacement, lumbar region: Secondary | ICD-10-CM | POA: Diagnosis not present

## 2024-06-06 DIAGNOSIS — Z556 Problems related to health literacy: Secondary | ICD-10-CM | POA: Diagnosis not present

## 2024-06-06 DIAGNOSIS — G709 Myoneural disorder, unspecified: Secondary | ICD-10-CM | POA: Diagnosis not present

## 2024-06-06 DIAGNOSIS — E559 Vitamin D deficiency, unspecified: Secondary | ICD-10-CM | POA: Diagnosis not present

## 2024-06-06 DIAGNOSIS — M4726 Other spondylosis with radiculopathy, lumbar region: Secondary | ICD-10-CM | POA: Diagnosis not present

## 2024-06-06 DIAGNOSIS — M179 Osteoarthritis of knee, unspecified: Secondary | ICD-10-CM | POA: Diagnosis not present

## 2024-06-06 DIAGNOSIS — T84091D Other mechanical complication of internal left hip prosthesis, subsequent encounter: Secondary | ICD-10-CM | POA: Diagnosis not present

## 2024-06-06 DIAGNOSIS — J302 Other seasonal allergic rhinitis: Secondary | ICD-10-CM | POA: Diagnosis not present

## 2024-06-06 DIAGNOSIS — I10 Essential (primary) hypertension: Secondary | ICD-10-CM | POA: Diagnosis not present

## 2024-06-07 DIAGNOSIS — T8451XD Infection and inflammatory reaction due to internal right hip prosthesis, subsequent encounter: Secondary | ICD-10-CM | POA: Diagnosis not present

## 2024-06-07 DIAGNOSIS — M25551 Pain in right hip: Secondary | ICD-10-CM | POA: Diagnosis not present

## 2024-06-11 DIAGNOSIS — T84091D Other mechanical complication of internal left hip prosthesis, subsequent encounter: Secondary | ICD-10-CM | POA: Diagnosis not present

## 2024-06-11 DIAGNOSIS — R7303 Prediabetes: Secondary | ICD-10-CM | POA: Diagnosis not present

## 2024-06-11 DIAGNOSIS — I10 Essential (primary) hypertension: Secondary | ICD-10-CM | POA: Diagnosis not present

## 2024-06-11 DIAGNOSIS — E559 Vitamin D deficiency, unspecified: Secondary | ICD-10-CM | POA: Diagnosis not present

## 2024-06-11 DIAGNOSIS — J329 Chronic sinusitis, unspecified: Secondary | ICD-10-CM | POA: Diagnosis not present

## 2024-06-11 DIAGNOSIS — G4700 Insomnia, unspecified: Secondary | ICD-10-CM | POA: Diagnosis not present

## 2024-06-11 DIAGNOSIS — M5418 Radiculopathy, sacral and sacrococcygeal region: Secondary | ICD-10-CM | POA: Diagnosis not present

## 2024-06-11 DIAGNOSIS — J302 Other seasonal allergic rhinitis: Secondary | ICD-10-CM | POA: Diagnosis not present

## 2024-06-11 DIAGNOSIS — T8452XD Infection and inflammatory reaction due to internal left hip prosthesis, subsequent encounter: Secondary | ICD-10-CM | POA: Diagnosis not present

## 2024-06-11 DIAGNOSIS — M179 Osteoarthritis of knee, unspecified: Secondary | ICD-10-CM | POA: Diagnosis not present

## 2024-06-11 DIAGNOSIS — M5126 Other intervertebral disc displacement, lumbar region: Secondary | ICD-10-CM | POA: Diagnosis not present

## 2024-06-11 DIAGNOSIS — G709 Myoneural disorder, unspecified: Secondary | ICD-10-CM | POA: Diagnosis not present

## 2024-06-11 DIAGNOSIS — Z96653 Presence of artificial knee joint, bilateral: Secondary | ICD-10-CM | POA: Diagnosis not present

## 2024-06-11 DIAGNOSIS — Z86718 Personal history of other venous thrombosis and embolism: Secondary | ICD-10-CM | POA: Diagnosis not present

## 2024-06-11 DIAGNOSIS — G894 Chronic pain syndrome: Secondary | ICD-10-CM | POA: Diagnosis not present

## 2024-06-11 DIAGNOSIS — M4726 Other spondylosis with radiculopathy, lumbar region: Secondary | ICD-10-CM | POA: Diagnosis not present

## 2024-06-11 DIAGNOSIS — K219 Gastro-esophageal reflux disease without esophagitis: Secondary | ICD-10-CM | POA: Diagnosis not present

## 2024-06-11 DIAGNOSIS — Z556 Problems related to health literacy: Secondary | ICD-10-CM | POA: Diagnosis not present

## 2024-06-11 DIAGNOSIS — E785 Hyperlipidemia, unspecified: Secondary | ICD-10-CM | POA: Diagnosis not present

## 2024-06-12 DIAGNOSIS — G894 Chronic pain syndrome: Secondary | ICD-10-CM | POA: Diagnosis not present

## 2024-06-12 DIAGNOSIS — K219 Gastro-esophageal reflux disease without esophagitis: Secondary | ICD-10-CM | POA: Diagnosis not present

## 2024-06-12 DIAGNOSIS — J302 Other seasonal allergic rhinitis: Secondary | ICD-10-CM | POA: Diagnosis not present

## 2024-06-12 DIAGNOSIS — E559 Vitamin D deficiency, unspecified: Secondary | ICD-10-CM | POA: Diagnosis not present

## 2024-06-12 DIAGNOSIS — J329 Chronic sinusitis, unspecified: Secondary | ICD-10-CM | POA: Diagnosis not present

## 2024-06-12 DIAGNOSIS — E785 Hyperlipidemia, unspecified: Secondary | ICD-10-CM | POA: Diagnosis not present

## 2024-06-12 DIAGNOSIS — Z96653 Presence of artificial knee joint, bilateral: Secondary | ICD-10-CM | POA: Diagnosis not present

## 2024-06-12 DIAGNOSIS — Z556 Problems related to health literacy: Secondary | ICD-10-CM | POA: Diagnosis not present

## 2024-06-12 DIAGNOSIS — M179 Osteoarthritis of knee, unspecified: Secondary | ICD-10-CM | POA: Diagnosis not present

## 2024-06-12 DIAGNOSIS — Z86718 Personal history of other venous thrombosis and embolism: Secondary | ICD-10-CM | POA: Diagnosis not present

## 2024-06-12 DIAGNOSIS — T84091D Other mechanical complication of internal left hip prosthesis, subsequent encounter: Secondary | ICD-10-CM | POA: Diagnosis not present

## 2024-06-12 DIAGNOSIS — G4700 Insomnia, unspecified: Secondary | ICD-10-CM | POA: Diagnosis not present

## 2024-06-12 DIAGNOSIS — G709 Myoneural disorder, unspecified: Secondary | ICD-10-CM | POA: Diagnosis not present

## 2024-06-12 DIAGNOSIS — T8452XD Infection and inflammatory reaction due to internal left hip prosthesis, subsequent encounter: Secondary | ICD-10-CM | POA: Diagnosis not present

## 2024-06-12 DIAGNOSIS — I10 Essential (primary) hypertension: Secondary | ICD-10-CM | POA: Diagnosis not present

## 2024-06-12 DIAGNOSIS — R7303 Prediabetes: Secondary | ICD-10-CM | POA: Diagnosis not present

## 2024-06-12 DIAGNOSIS — M5126 Other intervertebral disc displacement, lumbar region: Secondary | ICD-10-CM | POA: Diagnosis not present

## 2024-06-12 DIAGNOSIS — M5418 Radiculopathy, sacral and sacrococcygeal region: Secondary | ICD-10-CM | POA: Diagnosis not present

## 2024-06-12 DIAGNOSIS — M4726 Other spondylosis with radiculopathy, lumbar region: Secondary | ICD-10-CM | POA: Diagnosis not present

## 2024-06-15 DIAGNOSIS — I10 Essential (primary) hypertension: Secondary | ICD-10-CM | POA: Diagnosis not present

## 2024-06-15 DIAGNOSIS — E559 Vitamin D deficiency, unspecified: Secondary | ICD-10-CM | POA: Diagnosis not present

## 2024-06-15 DIAGNOSIS — M179 Osteoarthritis of knee, unspecified: Secondary | ICD-10-CM | POA: Diagnosis not present

## 2024-06-15 DIAGNOSIS — Z556 Problems related to health literacy: Secondary | ICD-10-CM | POA: Diagnosis not present

## 2024-06-15 DIAGNOSIS — J302 Other seasonal allergic rhinitis: Secondary | ICD-10-CM | POA: Diagnosis not present

## 2024-06-15 DIAGNOSIS — G709 Myoneural disorder, unspecified: Secondary | ICD-10-CM | POA: Diagnosis not present

## 2024-06-15 DIAGNOSIS — Z86718 Personal history of other venous thrombosis and embolism: Secondary | ICD-10-CM | POA: Diagnosis not present

## 2024-06-15 DIAGNOSIS — E785 Hyperlipidemia, unspecified: Secondary | ICD-10-CM | POA: Diagnosis not present

## 2024-06-15 DIAGNOSIS — M4726 Other spondylosis with radiculopathy, lumbar region: Secondary | ICD-10-CM | POA: Diagnosis not present

## 2024-06-15 DIAGNOSIS — M5126 Other intervertebral disc displacement, lumbar region: Secondary | ICD-10-CM | POA: Diagnosis not present

## 2024-06-15 DIAGNOSIS — R7303 Prediabetes: Secondary | ICD-10-CM | POA: Diagnosis not present

## 2024-06-15 DIAGNOSIS — K219 Gastro-esophageal reflux disease without esophagitis: Secondary | ICD-10-CM | POA: Diagnosis not present

## 2024-06-15 DIAGNOSIS — G894 Chronic pain syndrome: Secondary | ICD-10-CM | POA: Diagnosis not present

## 2024-06-15 DIAGNOSIS — J329 Chronic sinusitis, unspecified: Secondary | ICD-10-CM | POA: Diagnosis not present

## 2024-06-15 DIAGNOSIS — M5418 Radiculopathy, sacral and sacrococcygeal region: Secondary | ICD-10-CM | POA: Diagnosis not present

## 2024-06-15 DIAGNOSIS — T84091D Other mechanical complication of internal left hip prosthesis, subsequent encounter: Secondary | ICD-10-CM | POA: Diagnosis not present

## 2024-06-15 DIAGNOSIS — T8452XD Infection and inflammatory reaction due to internal left hip prosthesis, subsequent encounter: Secondary | ICD-10-CM | POA: Diagnosis not present

## 2024-06-15 DIAGNOSIS — G4700 Insomnia, unspecified: Secondary | ICD-10-CM | POA: Diagnosis not present

## 2024-06-15 DIAGNOSIS — Z96653 Presence of artificial knee joint, bilateral: Secondary | ICD-10-CM | POA: Diagnosis not present

## 2024-06-18 DIAGNOSIS — R7303 Prediabetes: Secondary | ICD-10-CM | POA: Diagnosis not present

## 2024-06-18 DIAGNOSIS — M4726 Other spondylosis with radiculopathy, lumbar region: Secondary | ICD-10-CM | POA: Diagnosis not present

## 2024-06-18 DIAGNOSIS — T84091D Other mechanical complication of internal left hip prosthesis, subsequent encounter: Secondary | ICD-10-CM | POA: Diagnosis not present

## 2024-06-18 DIAGNOSIS — M5418 Radiculopathy, sacral and sacrococcygeal region: Secondary | ICD-10-CM | POA: Diagnosis not present

## 2024-06-18 DIAGNOSIS — G894 Chronic pain syndrome: Secondary | ICD-10-CM | POA: Diagnosis not present

## 2024-06-18 DIAGNOSIS — M179 Osteoarthritis of knee, unspecified: Secondary | ICD-10-CM | POA: Diagnosis not present

## 2024-06-18 DIAGNOSIS — G4700 Insomnia, unspecified: Secondary | ICD-10-CM | POA: Diagnosis not present

## 2024-06-18 DIAGNOSIS — E559 Vitamin D deficiency, unspecified: Secondary | ICD-10-CM | POA: Diagnosis not present

## 2024-06-18 DIAGNOSIS — K219 Gastro-esophageal reflux disease without esophagitis: Secondary | ICD-10-CM | POA: Diagnosis not present

## 2024-06-18 DIAGNOSIS — J329 Chronic sinusitis, unspecified: Secondary | ICD-10-CM | POA: Diagnosis not present

## 2024-06-18 DIAGNOSIS — M5126 Other intervertebral disc displacement, lumbar region: Secondary | ICD-10-CM | POA: Diagnosis not present

## 2024-06-18 DIAGNOSIS — Z86718 Personal history of other venous thrombosis and embolism: Secondary | ICD-10-CM | POA: Diagnosis not present

## 2024-06-18 DIAGNOSIS — G709 Myoneural disorder, unspecified: Secondary | ICD-10-CM | POA: Diagnosis not present

## 2024-06-18 DIAGNOSIS — E785 Hyperlipidemia, unspecified: Secondary | ICD-10-CM | POA: Diagnosis not present

## 2024-06-18 DIAGNOSIS — I10 Essential (primary) hypertension: Secondary | ICD-10-CM | POA: Diagnosis not present

## 2024-06-18 DIAGNOSIS — T8452XD Infection and inflammatory reaction due to internal left hip prosthesis, subsequent encounter: Secondary | ICD-10-CM | POA: Diagnosis not present

## 2024-06-18 DIAGNOSIS — Z96653 Presence of artificial knee joint, bilateral: Secondary | ICD-10-CM | POA: Diagnosis not present

## 2024-06-18 DIAGNOSIS — Z556 Problems related to health literacy: Secondary | ICD-10-CM | POA: Diagnosis not present

## 2024-06-18 DIAGNOSIS — J302 Other seasonal allergic rhinitis: Secondary | ICD-10-CM | POA: Diagnosis not present

## 2024-06-19 DIAGNOSIS — R7303 Prediabetes: Secondary | ICD-10-CM | POA: Diagnosis not present

## 2024-06-19 DIAGNOSIS — M179 Osteoarthritis of knee, unspecified: Secondary | ICD-10-CM | POA: Diagnosis not present

## 2024-06-19 DIAGNOSIS — T8452XD Infection and inflammatory reaction due to internal left hip prosthesis, subsequent encounter: Secondary | ICD-10-CM | POA: Diagnosis not present

## 2024-06-19 DIAGNOSIS — G894 Chronic pain syndrome: Secondary | ICD-10-CM | POA: Diagnosis not present

## 2024-06-19 DIAGNOSIS — G709 Myoneural disorder, unspecified: Secondary | ICD-10-CM | POA: Diagnosis not present

## 2024-06-19 DIAGNOSIS — Z556 Problems related to health literacy: Secondary | ICD-10-CM | POA: Diagnosis not present

## 2024-06-19 DIAGNOSIS — J302 Other seasonal allergic rhinitis: Secondary | ICD-10-CM | POA: Diagnosis not present

## 2024-06-19 DIAGNOSIS — I10 Essential (primary) hypertension: Secondary | ICD-10-CM | POA: Diagnosis not present

## 2024-06-19 DIAGNOSIS — Z86718 Personal history of other venous thrombosis and embolism: Secondary | ICD-10-CM | POA: Diagnosis not present

## 2024-06-19 DIAGNOSIS — T84091D Other mechanical complication of internal left hip prosthesis, subsequent encounter: Secondary | ICD-10-CM | POA: Diagnosis not present

## 2024-06-19 DIAGNOSIS — E785 Hyperlipidemia, unspecified: Secondary | ICD-10-CM | POA: Diagnosis not present

## 2024-06-19 DIAGNOSIS — M5418 Radiculopathy, sacral and sacrococcygeal region: Secondary | ICD-10-CM | POA: Diagnosis not present

## 2024-06-19 DIAGNOSIS — E559 Vitamin D deficiency, unspecified: Secondary | ICD-10-CM | POA: Diagnosis not present

## 2024-06-19 DIAGNOSIS — K219 Gastro-esophageal reflux disease without esophagitis: Secondary | ICD-10-CM | POA: Diagnosis not present

## 2024-06-19 DIAGNOSIS — J329 Chronic sinusitis, unspecified: Secondary | ICD-10-CM | POA: Diagnosis not present

## 2024-06-19 DIAGNOSIS — T8451XD Infection and inflammatory reaction due to internal right hip prosthesis, subsequent encounter: Secondary | ICD-10-CM | POA: Diagnosis not present

## 2024-06-19 DIAGNOSIS — M5126 Other intervertebral disc displacement, lumbar region: Secondary | ICD-10-CM | POA: Diagnosis not present

## 2024-06-19 DIAGNOSIS — M4726 Other spondylosis with radiculopathy, lumbar region: Secondary | ICD-10-CM | POA: Diagnosis not present

## 2024-06-19 DIAGNOSIS — E7800 Pure hypercholesterolemia, unspecified: Secondary | ICD-10-CM | POA: Diagnosis not present

## 2024-06-19 DIAGNOSIS — Z96653 Presence of artificial knee joint, bilateral: Secondary | ICD-10-CM | POA: Diagnosis not present

## 2024-06-19 DIAGNOSIS — G4700 Insomnia, unspecified: Secondary | ICD-10-CM | POA: Diagnosis not present

## 2024-06-20 NOTE — Progress Notes (Signed)
Sent message, via epic in basket, requesting order in epic from surgeon  

## 2024-06-21 ENCOUNTER — Encounter (HOSPITAL_COMMUNITY): Payer: Self-pay

## 2024-06-21 DIAGNOSIS — M179 Osteoarthritis of knee, unspecified: Secondary | ICD-10-CM | POA: Diagnosis not present

## 2024-06-21 DIAGNOSIS — I10 Essential (primary) hypertension: Secondary | ICD-10-CM | POA: Diagnosis not present

## 2024-06-21 DIAGNOSIS — M5126 Other intervertebral disc displacement, lumbar region: Secondary | ICD-10-CM | POA: Diagnosis not present

## 2024-06-21 DIAGNOSIS — J329 Chronic sinusitis, unspecified: Secondary | ICD-10-CM | POA: Diagnosis not present

## 2024-06-21 DIAGNOSIS — G709 Myoneural disorder, unspecified: Secondary | ICD-10-CM | POA: Diagnosis not present

## 2024-06-21 DIAGNOSIS — Z556 Problems related to health literacy: Secondary | ICD-10-CM | POA: Diagnosis not present

## 2024-06-21 DIAGNOSIS — G4700 Insomnia, unspecified: Secondary | ICD-10-CM | POA: Diagnosis not present

## 2024-06-21 DIAGNOSIS — J302 Other seasonal allergic rhinitis: Secondary | ICD-10-CM | POA: Diagnosis not present

## 2024-06-21 DIAGNOSIS — E785 Hyperlipidemia, unspecified: Secondary | ICD-10-CM | POA: Diagnosis not present

## 2024-06-21 DIAGNOSIS — T84091D Other mechanical complication of internal left hip prosthesis, subsequent encounter: Secondary | ICD-10-CM | POA: Diagnosis not present

## 2024-06-21 DIAGNOSIS — K219 Gastro-esophageal reflux disease without esophagitis: Secondary | ICD-10-CM | POA: Diagnosis not present

## 2024-06-21 DIAGNOSIS — R7303 Prediabetes: Secondary | ICD-10-CM | POA: Diagnosis not present

## 2024-06-21 DIAGNOSIS — Z96653 Presence of artificial knee joint, bilateral: Secondary | ICD-10-CM | POA: Diagnosis not present

## 2024-06-21 DIAGNOSIS — E559 Vitamin D deficiency, unspecified: Secondary | ICD-10-CM | POA: Diagnosis not present

## 2024-06-21 DIAGNOSIS — T8452XD Infection and inflammatory reaction due to internal left hip prosthesis, subsequent encounter: Secondary | ICD-10-CM | POA: Diagnosis not present

## 2024-06-21 DIAGNOSIS — Z86718 Personal history of other venous thrombosis and embolism: Secondary | ICD-10-CM | POA: Diagnosis not present

## 2024-06-21 DIAGNOSIS — G894 Chronic pain syndrome: Secondary | ICD-10-CM | POA: Diagnosis not present

## 2024-06-21 DIAGNOSIS — M5418 Radiculopathy, sacral and sacrococcygeal region: Secondary | ICD-10-CM | POA: Diagnosis not present

## 2024-06-21 DIAGNOSIS — M4726 Other spondylosis with radiculopathy, lumbar region: Secondary | ICD-10-CM | POA: Diagnosis not present

## 2024-06-21 NOTE — Patient Instructions (Addendum)
 SURGICAL WAITING ROOM VISITATION  Patients having surgery or a procedure may have no more than 2 support people in the waiting area - these visitors may rotate.    Children under the age of 73 must have an adult with them who is not the patient.  Visitors with respiratory illnesses are discouraged from visiting and should remain at home.  If the patient needs to stay at the hospital during part of their recovery, the visitor guidelines for inpatient rooms apply. Pre-op nurse will coordinate an appropriate time for 1 support person to accompany patient in pre-op.  This support person may not rotate.    Please refer to the [facility] website for the visitor guidelines for Inpatients (after your surgery is over and you are in a regular room).       Your procedure is scheduled on: 07-09-24   Report to [facility] Main Entrance    Report to admitting at     11:00  AM   Call this number if you have problems the morning of surgery [phone]   Do not eat food :After Midnight.   After Midnight you may have the following liquids until _10:25_____ AM/  DAY OF SURGERY   then nothing by mouth  Water  Non-Citrus Juices (without pulp, NO RED-Apple, White grape, White cranberry) Black Coffee (NO MILK/CREAM OR CREAMERS, sugar ok)  Clear Tea (NO MILK/CREAM OR CREAMERS, sugar ok) regular and decaf                             Plain Jell-O (NO RED)                                           Fruit ices (not with fruit pulp, NO RED)                                     Popsicles (NO RED)                                                               Sports drinks like Gatorade (NO RED)                            If you have questions, please contact your surgeon's office.   FOLLOW  ANY ADDITIONAL PRE OP INSTRUCTIONS YOU RECEIVED FROM YOUR SURGEON'S OFFICE!!!     Oral Hygiene is also important to reduce your risk of infection.                                    Remember - BRUSH YOUR  TEETH THE MORNING OF SURGERY WITH YOUR REGULAR TOOTHPASTE  DENTURES WILL BE REMOVED PRIOR TO SURGERY PLEASE DO NOT APPLY Poly grip OR ADHESIVES!!!   Do NOT smoke after Midnight   Stop all vitamins and herbal supplements 7 days before surgery.   Take these medicines the morning of surgery with A SIP OF WATER : Rosuvastatin ,  Lyrica , Carvedilol , Wellbutrin , baclofen , bring rescue inhaler with you  DO NOT TAKE ANY ORAL DIABETIC MEDICATIONS DAY OF YOUR SURGERY  Bring CPAP mask and tubing day of surgery.                              You may not have any metal on your body including hair pins, jewelry, and body piercing             Do not wear make-up, lotions, powders, perfumes/cologne, or deodorant  Do not wear nail polish including gel and S&S, artificial/acrylic nails, or any other type of covering on natural nails including finger and toenails. If you have artificial nails, gel coating, etc. that needs to be removed by a nail salon please have this removed prior to surgery or surgery may need to be canceled/ delayed if the surgeon/ anesthesia feels like they are unable to be safely monitored.   Do not shave  4 days prior to surgery.           Do not bring valuables to the hospital. [facility] IS NOT             RESPONSIBLE   FOR VALUABLES.   Contacts, glasses, dentures or bridgework may not be worn into surgery.   Bring small overnight bag day of surgery.   DO NOT BRING YOUR HOME MEDICATIONS TO THE HOSPITAL. PHARMACY WILL DISPENSE MEDICATIONS LISTED ON YOUR MEDICATION LIST TO YOU DURING YOUR ADMISSION IN THE HOSPITAL!    Patients discharged on the day of surgery will not be allowed to drive home.  Someone NEEDS to stay with you for the first 24 hours after anesthesia.   Special Instructions: Bring a copy of your healthcare power of attorney and living will documents the day of surgery if you haven't scanned them before.              Please read over the following fact sheets you  were given: IF YOU HAVE QUESTIONS ABOUT YOUR PRE-OP INSTRUCTIONS PLEASE CALL [phone].   . If you test positive for Covid or have been in contact with anyone that has tested positive in the last 10 days please notify you surgeon.      Pre-operative 4 CHG Bath Instructions   You can play a key role in reducing the risk of infection after surgery. Your skin needs to be as free of germs as possible. You can reduce the number of germs on your skin by washing with CHG (chlorhexidine  gluconate) soap before surgery. CHG is an antiseptic soap that kills germs and continues to kill germs even after washing.   DO NOT use if you have an allergy to chlorhexidine /CHG or antibacterial soaps. If your skin becomes reddened or irritated, stop using the CHG and notify one of our RNs at   Please shower with the CHG soap starting 4 days before surgery using the following schedule:     Please keep in mind the following:  DO NOT shave, including legs and underarms, starting the day of your first shower.   You may shave your face at any point before/day of surgery.  Place clean sheets on your bed the day you start using CHG soap. Use a clean washcloth (not used since being washed) for each shower. DO NOT sleep with pets once you start using the CHG.  CHG Shower Instructions:  If you choose to wash your hair and private area, wash  first with your normal shampoo/soap.  After you use shampoo/soap, rinse your hair and body thoroughly to remove shampoo/soap residue.  Turn the water  OFF and apply about 3 tablespoons (45 ml) of CHG soap to a CLEAN washcloth.  Apply CHG soap ONLY FROM YOUR NECK DOWN TO YOUR TOES (washing for 3-5 minutes)  DO NOT use CHG soap on face, private areas, open wounds, or sores.  Pay special attention to the area where your surgery is being performed.  If you are having back surgery, having someone wash your back for you may be helpful. Wait 2 minutes after CHG soap is applied, then you may  rinse off the CHG soap.  Pat dry with a clean towel  Put on clean clothes/pajamas   If you choose to wear lotion, please use ONLY the CHG-compatible lotions on the back of this paper.     Additional instructions for the day of surgery: DO NOT APPLY any lotions, deodorants, cologne, or perfumes.   Put on clean/comfortable clothes.  Brush your teeth.  Ask your nurse before applying any prescription medications to the skin.   CHG Compatible Lotions   Aveeno Moisturizing lotion  Cetaphil Moisturizing Cream  Cetaphil Moisturizing Lotion  Clairol Herbal Essence Moisturizing Lotion, Dry Skin  Clairol Herbal Essence Moisturizing Lotion, Extra Dry Skin  Clairol Herbal Essence Moisturizing Lotion, Normal Skin  Curel Age Defying Therapeutic Moisturizing Lotion with Alpha Hydroxy  Curel Extreme Care Body Lotion  Curel Soothing Hands Moisturizing Hand Lotion  Curel Therapeutic Moisturizing Cream, Fragrance-Free  Curel Therapeutic Moisturizing Lotion, Fragrance-Free  Curel Therapeutic Moisturizing Lotion, Original Formula  Eucerin Daily Replenishing Lotion  Eucerin Dry Skin Therapy Plus Alpha Hydroxy Crme  Eucerin Dry Skin Therapy Plus Alpha Hydroxy Lotion  Eucerin Original Crme  Eucerin Original Lotion  Eucerin Plus Crme Eucerin Plus Lotion  Eucerin TriLipid Replenishing Lotion  Keri Anti-Bacterial Hand Lotion  Keri Deep Conditioning Original Lotion Dry Skin Formula Softly Scented  Keri Deep Conditioning Original Lotion, Fragrance Free Sensitive Skin Formula  Keri Lotion Fast Absorbing Fragrance Free Sensitive Skin Formula  Keri Lotion Fast Absorbing Softly Scented Dry Skin Formula  Keri Original Lotion  Keri Skin Renewal Lotion Keri Silky Smooth Lotion  Keri Silky Smooth Sensitive Skin Lotion  Nivea Body Creamy Conditioning Oil  Nivea Body Extra Enriched Teacher, adult education Moisturizing Lotion Nivea Crme  Nivea Skin Firming Lotion  NutraDerm 30  Skin Lotion  NutraDerm Skin Lotion  NutraDerm Therapeutic Skin Cream  NutraDerm Therapeutic Skin Lotion  ProShield Protective Hand Cream  WHAT IS A BLOOD TRANSFUSION? Blood Transfusion Information  A transfusion is the replacement of blood or some of its parts. Blood is made up of multiple cells which provide different functions. Red blood cells carry oxygen and are used for blood loss replacement. White blood cells fight against infection. Platelets control bleeding. Plasma helps clot blood. Other blood products are available for specialized needs, such as hemophilia or other clotting disorders. BEFORE THE TRANSFUSION  Who gives blood for transfusions?  Healthy volunteers who are fully evaluated to make sure their blood is safe. This is blood bank blood. Transfusion therapy is the safest it has ever been in the practice of medicine. Before blood is taken from a donor, a complete history is taken to make sure that person has no history of diseases nor engages in risky social behavior (examples are intravenous drug use or sexual activity with multiple partners). The donor's travel history is screened  to minimize risk of transmitting infections, such as malaria. The donated blood is tested for signs of infectious diseases, such as HIV and hepatitis. The blood is then tested to be sure it is compatible with you in order to minimize the chance of a transfusion reaction. If you or a relative donates blood, this is often done in anticipation of surgery and is not appropriate for emergency situations. It takes many days to process the donated blood. RISKS AND COMPLICATIONS Although transfusion therapy is very safe and saves many lives, the main dangers of transfusion include:  Getting an infectious disease. Developing a transfusion reaction. This is an allergic reaction to something in the blood you were given. Every precaution is taken to prevent this. The decision to have a blood transfusion has  been considered carefully by your caregiver before blood is given. Blood is not given unless the benefits outweigh the risks. AFTER THE TRANSFUSION Right after receiving a blood transfusion, you will usually feel much better and more energetic. This is especially true if your red blood cells have gotten low (anemic). The transfusion raises the level of the red blood cells which carry oxygen, and this usually causes an energy increase. The nurse administering the transfusion will monitor you carefully for complications. HOME CARE INSTRUCTIONS  No special instructions are needed after a transfusion. You may find your energy is better. Speak with your caregiver about any limitations on activity for underlying diseases you may have. SEEK MEDICAL CARE IF:  Your condition is not improving after your transfusion. You develop redness or irritation at the intravenous (IV) site. SEEK IMMEDIATE MEDICAL CARE IF:  Any of the following symptoms occur over the next 12 hours: Shaking chills. You have a temperature by mouth above 102 F (38.9 C), not controlled by medicine. Chest, back, or muscle pain. People around you feel you are not acting correctly or are confused. Shortness of breath or difficulty breathing. Dizziness and fainting. You get a rash or develop hives. You have a decrease in urine output. Your urine turns a dark color or changes to pink, red, or brown. Any of the following symptoms occur over the next 10 days: You have a temperature by mouth above 102 F (38.9 C), not controlled by medicine. Shortness of breath. Weakness after normal activity. The white part of the eye turns yellow (jaundice). You have a decrease in the amount of urine or are urinating less often. Your urine turns a dark color or changes to pink, red, or brown. Document Released: 09/03/2000 Document Revised: 11/29/2011 Document Reviewed: 04/22/2008 Exit[employer] Patient Information 2014 [place],  [place].  _______________________________________________________________________  Incentive Spirometer  An incentive spirometer is a tool that can help keep your lungs clear and active. This tool measures how well you are filling your lungs with each breath. Taking long deep breaths may help reverse or decrease the chance of developing breathing (pulmonary) problems (especially infection) following: A long period of time when you are unable to move or be active. BEFORE THE PROCEDURE  If the spirometer includes an indicator to show your best effort, your nurse or respiratory therapist will set it to a desired goal. If possible, sit up straight or lean slightly forward. Try not to slouch. Hold the incentive spirometer in an upright position. INSTRUCTIONS FOR USE  Sit on the edge of your bed if possible, or sit up as far as you can in bed or on a chair. Hold the incentive spirometer in an upright position. Breathe out normally. Place  the mouthpiece in your mouth and seal your lips tightly around it. Breathe in slowly and as deeply as possible, raising the piston or the ball toward the top of the column. Hold your breath for 3-5 seconds or for as long as possible. Allow the piston or ball to fall to the bottom of the column. Remove the mouthpiece from your mouth and breathe out normally. Rest for a few seconds and repeat Steps 1 through 7 at least 10 times every 1-2 hours when you are awake. Take your time and take a few normal breaths between deep breaths. The spirometer may include an indicator to show your best effort. Use the indicator as a goal to work toward during each repetition. After each set of 10 deep breaths, practice coughing to be sure your lungs are clear. If you have an incision (the cut made at the time of surgery), support your incision when coughing by placing a pillow or rolled up towels firmly against it. Once you are able to get out of bed, walk around indoors and cough well.  You may stop using the incentive spirometer when instructed by your caregiver.  RISKS AND COMPLICATIONS Take your time so you do not get dizzy or light-headed. If you are in pain, you may need to take or ask for pain medication before doing incentive spirometry. It is harder to take a deep breath if you are having pain. AFTER USE Rest and breathe slowly and easily. It can be helpful to keep track of a log of your progress. Your caregiver can provide you with a simple table to help with this. If you are using the spirometer at home, follow these instructions: SEEK MEDICAL CARE IF:  You are having difficultly using the spirometer. You have trouble using the spirometer as often as instructed. Your pain medication is not giving enough relief while using the spirometer. You develop fever of 100.5 F (38.1 C) or higher. SEEK IMMEDIATE MEDICAL CARE IF:  You cough up bloody sputum that had not been present before. You develop fever of 102 F (38.9 C) or greater. You develop worsening pain at or near the incision site. MAKE SURE YOU:  Understand these instructions. Will watch your condition. Will get help right away if you are not doing well or get worse. Document Released: 01/17/2007 Document Revised: 11/29/2011 Document Reviewed: 03/20/2007 [facility] Patient Information 2014 [place], [place].   ________________________________________________________________________

## 2024-06-21 NOTE — Progress Notes (Addendum)
 PCP - Wonda Bart, PA   Cardiologist - Sunit [place], Do LOV  preop televisit 02-27-24 Ashley Fountain,NP  ID- Annalee Orem, MD lov 06-05-24 epic   preop televisit 02-27-24 [name]  PPM/ICD -  Device Orders -  Rep Notified -   Chest x-ray - 04-05-24 epic EKG - 10-12-23 epic Stress Test - 05-20-23 epic ECHO - 04-13-23 epic Cardiac Cath -  CT cardiac- 04-05-23 epic  Sleep Study - n/a CPAP -   Fasting Blood Sugar -  Checks Blood Sugar ___n/a__ times a day  Blood Thinner Instructions:n/a Aspirin  Instructions:n/a  ERAS Protcol - PRE-SURGERY Ensure or G2-   NO ORDERS IN EPIC AT PREOP  COVID vaccine -yes  Activity--Able to get around home with no CP or SOB  Anesthesia review: DVT, HTN, pre-DM, CAD, Jessica Ward PA-C   addressed  pt. Concers with anesthesia.  Patient denies shortness of breath, fever, cough and chest pain at PAT appointment   All instructions explained to the patient, with a verbal understanding of the material. Patient agrees to go over the instructions while at home for a better understanding. Patient also instructed to self quarantine after being tested for COVID-19. The opportunity to ask questions was provided.

## 2024-06-22 DIAGNOSIS — J301 Allergic rhinitis due to pollen: Secondary | ICD-10-CM | POA: Diagnosis not present

## 2024-06-22 DIAGNOSIS — J3081 Allergic rhinitis due to animal (cat) (dog) hair and dander: Secondary | ICD-10-CM | POA: Diagnosis not present

## 2024-06-22 DIAGNOSIS — J3089 Other allergic rhinitis: Secondary | ICD-10-CM | POA: Diagnosis not present

## 2024-06-22 DIAGNOSIS — T8451XD Infection and inflammatory reaction due to internal right hip prosthesis, subsequent encounter: Secondary | ICD-10-CM | POA: Diagnosis not present

## 2024-06-25 DIAGNOSIS — E785 Hyperlipidemia, unspecified: Secondary | ICD-10-CM | POA: Diagnosis not present

## 2024-06-25 DIAGNOSIS — T84091D Other mechanical complication of internal left hip prosthesis, subsequent encounter: Secondary | ICD-10-CM | POA: Diagnosis not present

## 2024-06-25 DIAGNOSIS — M5418 Radiculopathy, sacral and sacrococcygeal region: Secondary | ICD-10-CM | POA: Diagnosis not present

## 2024-06-25 DIAGNOSIS — Z96653 Presence of artificial knee joint, bilateral: Secondary | ICD-10-CM | POA: Diagnosis not present

## 2024-06-25 DIAGNOSIS — M179 Osteoarthritis of knee, unspecified: Secondary | ICD-10-CM | POA: Diagnosis not present

## 2024-06-25 DIAGNOSIS — G709 Myoneural disorder, unspecified: Secondary | ICD-10-CM | POA: Diagnosis not present

## 2024-06-25 DIAGNOSIS — R7303 Prediabetes: Secondary | ICD-10-CM | POA: Diagnosis not present

## 2024-06-25 DIAGNOSIS — T8452XD Infection and inflammatory reaction due to internal left hip prosthesis, subsequent encounter: Secondary | ICD-10-CM | POA: Diagnosis not present

## 2024-06-25 DIAGNOSIS — M5126 Other intervertebral disc displacement, lumbar region: Secondary | ICD-10-CM | POA: Diagnosis not present

## 2024-06-25 DIAGNOSIS — M4726 Other spondylosis with radiculopathy, lumbar region: Secondary | ICD-10-CM | POA: Diagnosis not present

## 2024-06-25 DIAGNOSIS — E559 Vitamin D deficiency, unspecified: Secondary | ICD-10-CM | POA: Diagnosis not present

## 2024-06-25 DIAGNOSIS — K219 Gastro-esophageal reflux disease without esophagitis: Secondary | ICD-10-CM | POA: Diagnosis not present

## 2024-06-25 DIAGNOSIS — I10 Essential (primary) hypertension: Secondary | ICD-10-CM | POA: Diagnosis not present

## 2024-06-25 DIAGNOSIS — Z556 Problems related to health literacy: Secondary | ICD-10-CM | POA: Diagnosis not present

## 2024-06-25 DIAGNOSIS — G894 Chronic pain syndrome: Secondary | ICD-10-CM | POA: Diagnosis not present

## 2024-06-25 DIAGNOSIS — G4700 Insomnia, unspecified: Secondary | ICD-10-CM | POA: Diagnosis not present

## 2024-06-25 DIAGNOSIS — J302 Other seasonal allergic rhinitis: Secondary | ICD-10-CM | POA: Diagnosis not present

## 2024-06-25 DIAGNOSIS — Z86718 Personal history of other venous thrombosis and embolism: Secondary | ICD-10-CM | POA: Diagnosis not present

## 2024-06-25 DIAGNOSIS — J329 Chronic sinusitis, unspecified: Secondary | ICD-10-CM | POA: Diagnosis not present

## 2024-06-26 ENCOUNTER — Other Ambulatory Visit: Payer: Self-pay

## 2024-06-26 ENCOUNTER — Ambulatory Visit: Payer: Self-pay | Admitting: Emergency Medicine

## 2024-06-26 ENCOUNTER — Encounter (HOSPITAL_COMMUNITY)
Admission: RE | Admit: 2024-06-26 | Discharge: 2024-06-26 | Disposition: A | Discharge status: Home / Self Care | Source: Ambulatory Visit | Attending: Orthopedic Surgery | Admitting: Orthopedic Surgery

## 2024-06-26 ENCOUNTER — Encounter (HOSPITAL_COMMUNITY): Payer: Self-pay

## 2024-06-26 VITALS — BP 127/84 | Temp 98.7°F | Resp 16 | Ht 71.0 in | Wt 248.0 lb

## 2024-06-26 DIAGNOSIS — I2510 Atherosclerotic heart disease of native coronary artery without angina pectoris: Secondary | ICD-10-CM | POA: Insufficient documentation

## 2024-06-26 DIAGNOSIS — Z01812 Encounter for preprocedural laboratory examination: Secondary | ICD-10-CM | POA: Insufficient documentation

## 2024-06-26 DIAGNOSIS — Z01818 Encounter for other preprocedural examination: Secondary | ICD-10-CM

## 2024-06-26 DIAGNOSIS — G894 Chronic pain syndrome: Secondary | ICD-10-CM | POA: Diagnosis not present

## 2024-06-26 DIAGNOSIS — T8451XA Infection and inflammatory reaction due to internal right hip prosthesis, initial encounter: Secondary | ICD-10-CM | POA: Insufficient documentation

## 2024-06-26 DIAGNOSIS — I10 Essential (primary) hypertension: Secondary | ICD-10-CM | POA: Insufficient documentation

## 2024-06-26 DIAGNOSIS — R7303 Prediabetes: Secondary | ICD-10-CM | POA: Diagnosis not present

## 2024-06-26 DIAGNOSIS — H348332 Tributary (branch) retinal vein occlusion, bilateral, stable: Secondary | ICD-10-CM | POA: Diagnosis not present

## 2024-06-26 DIAGNOSIS — Z9884 Bariatric surgery status: Secondary | ICD-10-CM | POA: Diagnosis not present

## 2024-06-26 DIAGNOSIS — Z86718 Personal history of other venous thrombosis and embolism: Secondary | ICD-10-CM | POA: Insufficient documentation

## 2024-06-26 DIAGNOSIS — G8929 Other chronic pain: Secondary | ICD-10-CM

## 2024-06-26 HISTORY — DX: Atherosclerotic heart disease of native coronary artery without angina pectoris: I25.10

## 2024-06-26 LAB — BASIC METABOLIC PANEL WITH GFR
Anion gap: 8 (ref 5–15)
BUN: 13 mg/dL (ref 6–20)
CO2: 27 mmol/L (ref 22–32)
Calcium: 9.3 mg/dL (ref 8.9–10.3)
Chloride: 103 mmol/L (ref 98–111)
Creatinine, Ser: 0.81 mg/dL (ref 0.44–1.00)
GFR, Estimated: >60 mL/min (ref >60)
Glucose, Bld: 61 mg/dL — ABNORMAL LOW (ref 70–99)
Potassium: 3.9 mmol/L (ref 3.5–5.1)
Sodium: 138 mmol/L (ref 135–145)

## 2024-06-26 LAB — TYPE AND SCREEN
ABO/RH(D): B POS
Antibody Screen: NEGATIVE

## 2024-06-26 LAB — CBC
HCT: 38 % (ref 36.0–46.0)
Hemoglobin: 11.7 g/dL — ABNORMAL LOW (ref 12.0–15.0)
MCH: 25.4 pg — ABNORMAL LOW (ref 26.0–34.0)
MCHC: 30.8 g/dL (ref 30.0–36.0)
MCV: 82.4 fL (ref 80.0–100.0)
Platelets: 237 K/uL (ref 150–400)
RBC: 4.61 MIL/uL (ref 3.87–5.11)
RDW: 15.9 % — ABNORMAL HIGH (ref 11.5–15.5)
WBC: 6.7 K/uL (ref 4.0–10.5)
nRBC: 0 % (ref 0.0–0.2)

## 2024-06-26 LAB — SURGICAL PCR SCREEN
MRSA, PCR: NEGATIVE
Staphylococcus aureus: POSITIVE — AB

## 2024-06-26 NOTE — H&P (View-Only) (Signed)
 TOTAL HIP REVISION ADMISSION H&P  Patient is admitted for right revision total hip arthroplasty.  Subjective:  Chief Complaint: right hip pain  HPI: Ashley [name], 48 y.o. female, has a history of pain and functional disability in the right hip due to chronic joint infection and patient has failed non-surgical conservative treatments for greater than 12 weeks to include NSAID's and/or analgesics, supervised PT with diminished ADL's post treatment, use of assistive devices, and activity modification. The indications for the revision total hip arthroplasty are history of total hip infection.  Onset of symptoms was gradual starting 3 years ago with gradually worsening course since that time.  Prior procedures on the right hip include arthroplasty and recent first stage of revision in July 2025.  Patient currently rates pain in the right hip at 10 out of 10 with activity.  There is night pain, worsening of pain with activity and weight bearing, pain that interfers with activities of daily living, and pain with passive range of motion. Patient has finished IV antibiotics and recent aspiration was negative for infection.  This condition presents safety issues increasing the risk of falls.  There is no current active infection.  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   Bilateral leg edema 01/09/2024   Chronic pain syndrome 01/09/2024   Hyperlipidemia 12/22/2023   Seasonal allergies 12/22/2023   Vitamin D  deficiency 11/21/2023   Prediabetes 11/03/2023   Allergic rhinitis 01/29/2022   Allergic rhinitis due to animal hair and dander 01/29/2022   Allergic rhinitis due to pollen  01/29/2022   Chronic allergic conjunctivitis 01/29/2022   Seafood allergy 01/29/2022   History of endometrial ablation 07/24/2021   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   PCOS (polycystic ovarian syndrome) 04/02/2021   Depressive disorder 04/02/2021   Generalized OA 04/02/2021   Postoperative pain 04/02/2021   Acute blood loss anemia 04/02/2021   Neuropathic pain 04/02/2021   Therapeutic opioid induced constipation 04/02/2021   Urinary retention 04/02/2021   Fall 04/02/2021   Labile blood pressure 04/02/2021   Pain 04/02/2021   Generalized anxiety disorder 04/02/2021   Obesity 04/02/2021   Body mass index (BMI) 40.0-44.9, adult (HCC) 12/31/2020   Flat foot 05/07/2019   Abnormality of gait 02/22/2019   Herpes simplex 01/18/2019   Lumbosacral radiculopathy at L4 06/09/2018   Paresis of single lower extremity (HCC) 04/26/2018   Lumbar radiculopathy 02/21/2018   Status post surgery 02/17/2018   Hip pain 01/05/2018   Radiculopathy, lumbar region 08/10/2017   Lumbar spondylosis 08/10/2017   Herniated nucleus pulposus, lumbar 05/19/2016   Primary localized osteoarthritis of right hip 04/21/2016   Sinusitis 06/25/2015   Other specified cough 06/25/2015   Essential hypertension 04/03/2015   Dehydration 04/03/2015   CAP (community acquired pneumonia) 03/31/2015   Right lower lobe pneumonia 03/31/2015   Fever 03/31/2015   Pregnancy 04/29/2014   Miscarriage    Incomplete miscarriage 04/02/2011   Deep venous thrombosis (HCC) 09/20/2010   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  Complication of anesthesia    Depression    History of pneumonia    Hypertension    Infertility, female    Joint pain    Lumbar spondylosis    Neuromuscular disease (HCC)    numbness, paresthesias RLE, from failed lumbar back surgery    Osteoarthritis    PCOS (polycystic ovarian syndrome)    Pneumonia    2016   PONV (postoperative nausea and vomiting)    Almost a week to wake up   Pre-diabetes    Swelling of lower extremity     Past Surgical History:  Procedure Laterality Date   ABDOMINAL EXPOSURE N/A 02/17/2018   Procedure: ABDOMINAL EXPOSURE;  Surgeon: Oris Krystal FALCON, MD;  Location: [facility] OR;  Service: Vascular;  Laterality: N/A;   ANKLE SURGERY Bilateral    x 2   ANTERIOR LAT LUMBAR FUSION N/A 02/17/2018   Procedure: Lumbar three-four Lumbar four-five  Anterolateral lumbar interbody fusion;  Surgeon: Colon Shove, MD;  Location: [facility] OR;  Service: Neurosurgery;  Laterality: N/A;   ANTERIOR LUMBAR FUSION N/A 02/17/2018   Procedure: Lumbar five-Sacral one Anterior lumbar interbody fusion;  Surgeon: Colon Shove, MD;  Location: [facility] OR;  Service: Neurosurgery;  Laterality: N/A;   APPLICATION OF ROBOTIC ASSISTANCE FOR SPINAL PROCEDURE N/A 02/17/2018   Procedure: APPLICATION OF ROBOTIC ASSISTANCE FOR SPINAL PROCEDURE;  Surgeon: Colon Shove, MD;  Location: [facility] OR;  Service: Neurosurgery;  Laterality: N/A;   APPLICATION OF ROBOTIC ASSISTANCE FOR SPINAL PROCEDURE N/A 06/09/2018   Procedure: APPLICATION OF ROBOTIC ASSISTANCE FOR SPINAL PROCEDURE;  Surgeon: Colon Shove, MD;  Location: [facility] OR;  Service: Neurosurgery;  Laterality: N/A;   BACK SURGERY     2017  discectomy   CARPAL TUNNEL RELEASE Left 07/04/2014   Procedure: LEFT CARPAL TUNNEL RELEASE;  Surgeon: Toribio FALCON Chancy, MD;  Location: [facility];  Service: Orthopedics;  Laterality: Left;   CARPAL TUNNEL RELEASE Right 08/08/2014   Procedure: RIGHT CARPAL TUNNEL RELEASE;  Surgeon: Toribio FALCON Chancy, MD;  Location: [facility];  Service: Orthopedics;  Laterality: Right;   COLONOSCOPY  2024   DILATION AND CURETTAGE OF UTERUS     DILATION AND EVACUATION  04/02/2011   Procedure: DILATATION AND EVACUATION (D&E);  Surgeon: Alm JAYSON Cook, MD;  Location:  [facility];  Service: Gynecology;  Laterality: N/A;  dvt left mid thigh   DORSAL COMPARTMENT RELEASE Left 07/04/2014   Procedure: LEFT DEQUERVAINS;  Surgeon: Toribio FALCON Chancy, MD;  Location:  SURGERY CENTER;  Service: Orthopedics;  Laterality: Left;   ENDOSCOPIC PLANTAR FASCIOTOMY Left 02/01/2002   HARDWARE REMOVAL Right 06/09/2018   Procedure: Repositioning of Right Lumbar three Right lumbar  four pedicle screw with MAZOR;  Surgeon: Colon Shove, MD;  Location: [facility] OR;  Service: Neurosurgery;  Laterality: Right;   HYSTEROSCOPY WITH D & C  07/17/2010   with exc. endometrial polyps   KNEE ARTHROSCOPY Right 03/07/2003; 01/14/2005; 08/31/2006   KNEE ARTHROSCOPY Left 03/21/2003; 11/26/2004   LUMBAR PERCUTANEOUS PEDICLE SCREW 3 LEVEL N/A 02/17/2018   Procedure: LUMBAR PERCUTANEOUS PEDICLE SCREW PLACEMENT LUMBAR THREE-SACRAL ONE;  Surgeon: Colon Shove, MD;  Location: [facility] OR;  Service: Neurosurgery;  Laterality: N/A;   SHOULDER ARTHROSCOPY Right    TOE SURGERY Left 01/10/2003   claw toe correction 2nd, 3rd, 4th toes   TOTAL HIP ARTHROPLASTY Right 04/21/2016   Procedure: TOTAL HIP ARTHROPLASTY ANTERIOR APPROACH;  Surgeon: Toribio FALCON Chancy, MD;  Location: [facility] OR;  Service: Orthopedics;  Laterality: Right;  TOTAL HIP ARTHROPLASTY WITH HARDWARE REMOVAL Right 04/04/2024   Procedure: REVISION, ARTHROPLASTY, HIP;  Surgeon: Edna Toribio LABOR, MD;  Location: [facility];  Service: Orthopedics;  Laterality: Right;   TOTAL KNEE ARTHROPLASTY Left 01/21/2010   TOTAL KNEE ARTHROPLASTY Right 08/26/2008   UPPER GI ENDOSCOPY N/A 04/14/2021   Procedure: UPPER GI ENDOSCOPY;  Surgeon: Gladis Cough, MD;  Location: [facility];  Service: General;  Laterality: N/A;    Current Outpatient Medications  Medication Sig Dispense Refill Last Dose/Taking   Albuterol  Sulfate (PROAIR  RESPICLICK) 108 (90 Base) MCG/ACT AEPB Inhale 1 puff into the lungs every 4 (four) hours as needed. 1 each 0    baclofen  (LIORESAL ) 10 MG tablet Take 1  tablet by mouth twice a day 180 tablet 0    buPROPion  (WELLBUTRIN  XL) 150 MG 24 hr tablet Take 150 mg by mouth in the morning.      calcium  carbonate (TUMS EX) 750 MG chewable tablet Chew 2 tablets by mouth daily before lunch.      carvedilol  (COREG ) 6.25 MG tablet Take 6.25 mg by mouth in the morning.      docusate sodium  (COLACE) 100 MG capsule Take 200 mg by mouth daily as needed for mild constipation.      EPINEPHrine  0.3 mg/0.3 mL IJ SOAJ injection Inject 0.3 mg into the muscle once.  0    furosemide  (LASIX ) 20 MG tablet Take 20 mg by mouth daily as needed for edema.      levocetirizine (XYZAL ALLERGY 24HR) 5 MG tablet Take 5 mg by mouth daily as needed for allergies.      lidocaine  (LIDODERM ) 5 % Apply 3 patches to skin once daily as directed-remove after 12 hours (Patient taking differently: Place 1-4 patches onto the skin daily as needed (pain.).) 270 patch 0    lisdexamfetamine (VYVANSE ) 50 MG capsule Take 50 mg by mouth in the morning.      losartan -hydrochlorothiazide  (HYZAAR) 100-12.5 MG tablet Take 1 tablet by mouth in the morning.      Multiple Vitamins-Minerals (BARIATRIC MULTIVITAMINS/IRON PO) Take 2 tablets by mouth in the morning.      naloxegol  oxalate (MOVANTIK ) 25 MG TABS tablet Take 1 tablet (25 mg total) by mouth daily. 30 tablet 2    oxyCODONE -acetaminophen  (PERCOCET) 10-325 MG tablet Take 1 tablet by mouth every 6 (six) hours as directed 06/21/24 120 tablet 0    polyethylene glycol (MIRALAX ) 17 g packet Take 17 g by mouth daily. (Patient taking differently: Take 17 g by mouth daily as needed (constipation.).)      pregabalin  (LYRICA ) 75 MG capsule Take 1 capsule (75 mg total) by mouth 3 (three) times daily. 270 capsule 0    rosuvastatin  (CRESTOR ) 10 MG tablet Take 1 tablet (10 mg total) by mouth daily. 90 tablet 1    Vitamin D -Vitamin K (K2 PLUS D3 PO) Take 2 capsules by mouth in the morning.      zolpidem  (AMBIEN ) 10 MG tablet Take 10 mg by mouth at bedtime as needed for  sleep.      No current facility-administered medications for this visit.   Allergies  Allergen Reactions   Gadolinium Derivatives Nausea And Vomiting and Other (See Comments)    Pt describes this happens every time she gets gado even with slow injection    Daptomycin  Other (See Comments)    Altered mental status   Codeine Itching, Nausea Only and Other (See Comments)    Tolerates oxycodone     Duloxetine  altered mental status    Social History   Tobacco Use   Smoking status: Never   Smokeless tobacco: Never  Substance Use Topics   Alcohol  use: No    Alcohol /week: 0.0 standard drinks of alcohol     Family History  Problem Relation Age of Onset   Diabetes Father    Cancer Father        COLON   Heart disease Father    Kidney disease Father        dialysis   High blood pressure Father    Heart disease Sister    Diabetes Sister    Heart failure Sister    Heart failure Sister    Heart disease Sister    Cancer Maternal Grandmother        UTERINE   Cancer Paternal Grandmother        breast   Breast cancer Paternal Grandmother    Breast cancer Cousin       Review of Systems  Musculoskeletal:  Positive for arthralgias.  All other systems reviewed and are negative.   Objective:  Physical Exam Constitutional:      General: She is not in acute distress.    Appearance: Normal appearance. She is not ill-appearing.  HENT:     Head: Normocephalic and atraumatic.     Right Ear: External ear normal.     Left Ear: External ear normal.     Nose: Nose normal.     Mouth/Throat:     Mouth: Mucous membranes are moist.     Pharynx: Oropharynx is clear.  Eyes:     Extraocular Movements: Extraocular movements intact.     Conjunctiva/sclera: Conjunctivae normal.  Cardiovascular:     Rate and Rhythm: Normal rate and regular rhythm.     Pulses: Normal pulses.     Heart sounds: Normal heart sounds.  Pulmonary:     Effort: Pulmonary effort is normal.     Breath sounds:  Normal breath sounds.  Abdominal:     General: Bowel sounds are normal.     Palpations: Abdomen is soft.     Tenderness: There is no abdominal tenderness.  Musculoskeletal:        General: Tenderness present.     Cervical back: Normal range of motion and neck supple.     Comments: Well healed incision lateral-posterior aspect of left hip.  TTP over groin, lateral aspect, greater trochanter.  Mild IT band tenderness.  No significant swelling.  Otherwise no overlying lesions of area of chief complaint.  Decreased strength and ROM due to elicited pain.  Dorsiflexion and plantarflexion intact.  BLE appear grossly neurovascularly intact.  Gait mildly antalgic with use of walker.   Skin:    General: Skin is warm and dry.  Neurological:     Mental Status: She is alert and oriented to person, place, and time. Mental status is at baseline.  Psychiatric:        Mood and Affect: Mood normal.        Behavior: Behavior normal.     Vital signs in last 24 hours: @VSRANGES @   Labs:   Estimated body mass index is 34.59 kg/m as calculated from the following:   Height as of an earlier encounter on 06/26/24: 5' 11 (1.803 m).   Weight as of an earlier encounter on 06/26/24: 112.5 kg.  Imaging Review:  Plain radiographs demonstrate antibiotic spacer components of the right hip(s) in good position without adverse features.  The bone quality appears to  be fair for age and reported activity level.      Assessment/Plan:  Chronic hip joint infection, right hip(s) with failed previous arthroplasty; stage 2 revision  The patient history, physical examination, clinical judgement of the provider and imaging studies are consistent with chronic infection of the right hip(s), previous total hip arthroplasty and first stage revision. Revision total hip arthroplasty stage 2 removal of antibiotic spacer and placement of revision total hip replacement is deemed medically necessary. The treatment options including  medical management, injection therapy, arthroscopy and arthroplasty were discussed at length. The risks and benefits of total hip arthroplasty were presented and reviewed. The risks due to aseptic loosening, infection, stiffness, dislocation/subluxation,  thromboembolic complications and other imponderables were discussed.  The patient acknowledged the explanation, agreed to proceed with the plan and consent was signed. Patient is being admitted for inpatient treatment for surgery, pain control, PT, OT, prophylactic antibiotics, VTE prophylaxis, progressive ambulation and ADL's and discharge planning. The patient is planning to be discharged home with home health services

## 2024-06-26 NOTE — H&P (Signed)
 TOTAL HIP REVISION ADMISSION H&P  Patient is admitted for right revision total hip arthroplasty.  Subjective:  Chief Complaint: right hip pain  HPI: Ashley [name], 48 y.o. female, has a history of pain and functional disability in the right hip due to chronic joint infection and patient has failed non-surgical conservative treatments for greater than 12 weeks to include NSAID's and/or analgesics, supervised PT with diminished ADL's post treatment, use of assistive devices, and activity modification. The indications for the revision total hip arthroplasty are history of total hip infection.  Onset of symptoms was gradual starting 3 years ago with gradually worsening course since that time.  Prior procedures on the right hip include arthroplasty and recent first stage of revision in July 2025.  Patient currently rates pain in the right hip at 10 out of 10 with activity.  There is night pain, worsening of pain with activity and weight bearing, pain that interfers with activities of daily living, and pain with passive range of motion. Patient has finished IV antibiotics and recent aspiration was negative for infection.  This condition presents safety issues increasing the risk of falls.  There is no current active infection.  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   Bilateral leg edema 01/09/2024   Chronic pain syndrome 01/09/2024   Hyperlipidemia 12/22/2023   Seasonal allergies 12/22/2023   Vitamin D  deficiency 11/21/2023   Prediabetes 11/03/2023   Allergic rhinitis 01/29/2022   Allergic rhinitis due to animal hair and dander 01/29/2022   Allergic rhinitis due to pollen  01/29/2022   Chronic allergic conjunctivitis 01/29/2022   Seafood allergy 01/29/2022   History of endometrial ablation 07/24/2021   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   PCOS (polycystic ovarian syndrome) 04/02/2021   Depressive disorder 04/02/2021   Generalized OA 04/02/2021   Postoperative pain 04/02/2021   Acute blood loss anemia 04/02/2021   Neuropathic pain 04/02/2021   Therapeutic opioid induced constipation 04/02/2021   Urinary retention 04/02/2021   Fall 04/02/2021   Labile blood pressure 04/02/2021   Pain 04/02/2021   Generalized anxiety disorder 04/02/2021   Obesity 04/02/2021   Body mass index (BMI) 40.0-44.9, adult (HCC) 12/31/2020   Flat foot 05/07/2019   Abnormality of gait 02/22/2019   Herpes simplex 01/18/2019   Lumbosacral radiculopathy at L4 06/09/2018   Paresis of single lower extremity (HCC) 04/26/2018   Lumbar radiculopathy 02/21/2018   Status post surgery 02/17/2018   Hip pain 01/05/2018   Radiculopathy, lumbar region 08/10/2017   Lumbar spondylosis 08/10/2017   Herniated nucleus pulposus, lumbar 05/19/2016   Primary localized osteoarthritis of right hip 04/21/2016   Sinusitis 06/25/2015   Other specified cough 06/25/2015   Essential hypertension 04/03/2015   Dehydration 04/03/2015   CAP (community acquired pneumonia) 03/31/2015   Right lower lobe pneumonia 03/31/2015   Fever 03/31/2015   Pregnancy 04/29/2014   Miscarriage    Incomplete miscarriage 04/02/2011   Deep venous thrombosis (HCC) 09/20/2010   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  Complication of anesthesia    Depression    History of pneumonia    Hypertension    Infertility, female    Joint pain    Lumbar spondylosis    Neuromuscular disease (HCC)    numbness, paresthesias RLE, from failed lumbar back surgery    Osteoarthritis    PCOS (polycystic ovarian syndrome)    Pneumonia    2016   PONV (postoperative nausea and vomiting)    Almost a week to wake up   Pre-diabetes    Swelling of lower extremity     Past Surgical History:  Procedure Laterality Date   ABDOMINAL EXPOSURE N/A 02/17/2018   Procedure: ABDOMINAL EXPOSURE;  Surgeon: Oris Krystal FALCON, MD;  Location: [facility];  Service: Vascular;  Laterality: N/A;   ANKLE SURGERY Bilateral    x 2   ANTERIOR LAT LUMBAR FUSION N/A 02/17/2018   Procedure: Lumbar three-four Lumbar four-five  Anterolateral lumbar interbody fusion;  Surgeon: Colon Shove, MD;  Location: [facility];  Service: Neurosurgery;  Laterality: N/A;   ANTERIOR LUMBAR FUSION N/A 02/17/2018   Procedure: Lumbar five-Sacral one Anterior lumbar interbody fusion;  Surgeon: Colon Shove, MD;  Location: [facility] OR;  Service: Neurosurgery;  Laterality: N/A;   APPLICATION OF ROBOTIC ASSISTANCE FOR SPINAL PROCEDURE N/A 02/17/2018   Procedure: APPLICATION OF ROBOTIC ASSISTANCE FOR SPINAL PROCEDURE;  Surgeon: Colon Shove, MD;  Location: [facility];  Service: Neurosurgery;  Laterality: N/A;   APPLICATION OF ROBOTIC ASSISTANCE FOR SPINAL PROCEDURE N/A 06/09/2018   Procedure: APPLICATION OF ROBOTIC ASSISTANCE FOR SPINAL PROCEDURE;  Surgeon: Colon Shove, MD;  [facility]: [facility];  Service: Neurosurgery;  Laterality: N/A;   BACK SURGERY     2017  discectomy   CARPAL TUNNEL RELEASE Left 07/04/2014   Procedure: LEFT CARPAL TUNNEL RELEASE;  Surgeon: Toribio FALCON Chancy, MD;  Location: [facility];  Service: Orthopedics;  Laterality: Left;   CARPAL TUNNEL RELEASE Right 08/08/2014   Procedure: RIGHT CARPAL TUNNEL RELEASE;  Surgeon: Toribio FALCON Chancy, MD;  Location: [facility];  Service: Orthopedics;  Laterality: Right;   COLONOSCOPY  2024   DILATION AND CURETTAGE OF UTERUS     DILATION AND EVACUATION  04/02/2011   Procedure: DILATATION AND EVACUATION (D&E);  Surgeon: Alm JAYSON Cook, MD;  Location:  [facility];  Service: Gynecology;  Laterality: N/A;  dvt left mid thigh   DORSAL COMPARTMENT RELEASE Left 07/04/2014   Procedure: LEFT DEQUERVAINS;  Surgeon: Toribio FALCON Chancy, MD;  Location:  SURGERY CENTER;  Service: Orthopedics;  Laterality: Left;   ENDOSCOPIC PLANTAR FASCIOTOMY Left 02/01/2002   HARDWARE REMOVAL Right 06/09/2018   Procedure: Repositioning of Right Lumbar three Right lumbar  four pedicle screw with MAZOR;  Surgeon: Colon Shove, MD;  Location: [facility] OR;  Service: Neurosurgery;  Laterality: Right;   HYSTEROSCOPY WITH D & C  07/17/2010   with exc. endometrial polyps   KNEE ARTHROSCOPY Right 03/07/2003; 01/14/2005; 08/31/2006   KNEE ARTHROSCOPY Left 03/21/2003; 11/26/2004   LUMBAR PERCUTANEOUS PEDICLE SCREW 3 LEVEL N/A 02/17/2018   Procedure: LUMBAR PERCUTANEOUS PEDICLE SCREW PLACEMENT LUMBAR THREE-SACRAL ONE;  Surgeon: Colon Shove, MD;  [facility]: [facility];  Service: Neurosurgery;  Laterality: N/A;   SHOULDER ARTHROSCOPY Right    TOE SURGERY Left 01/10/2003   claw toe correction 2nd, 3rd, 4th toes   TOTAL HIP ARTHROPLASTY Right 04/21/2016   Procedure: TOTAL HIP ARTHROPLASTY ANTERIOR APPROACH;  Surgeon: Toribio FALCON Chancy, MD;  Location: [facility] OR;  Service: Orthopedics;  Laterality: Right;  TOTAL HIP ARTHROPLASTY WITH HARDWARE REMOVAL Right 04/04/2024   Procedure: REVISION, ARTHROPLASTY, HIP;  Surgeon: Edna Toribio LABOR, MD;  Location: [facility];  Service: Orthopedics;  Laterality: Right;   TOTAL KNEE ARTHROPLASTY Left 01/21/2010   TOTAL KNEE ARTHROPLASTY Right 08/26/2008   UPPER GI ENDOSCOPY N/A 04/14/2021   Procedure: UPPER GI ENDOSCOPY;  Surgeon: Gladis Cough, MD;  Location: [facility];  Service: General;  Laterality: N/A;    Current Outpatient Medications  Medication Sig Dispense Refill Last Dose/Taking   Albuterol  Sulfate (PROAIR  RESPICLICK) 108 (90 Base) MCG/ACT AEPB Inhale 1 puff into the lungs every 4 (four) hours as needed. 1 each 0    baclofen  (LIORESAL ) 10 MG tablet Take 1  tablet by mouth twice a day 180 tablet 0    buPROPion  (WELLBUTRIN  XL) 150 MG 24 hr tablet Take 150 mg by mouth in the morning.      calcium  carbonate (TUMS EX) 750 MG chewable tablet Chew 2 tablets by mouth daily before lunch.      carvedilol  (COREG ) 6.25 MG tablet Take 6.25 mg by mouth in the morning.      docusate sodium  (COLACE) 100 MG capsule Take 200 mg by mouth daily as needed for mild constipation.      EPINEPHrine  0.3 mg/0.3 mL IJ SOAJ injection Inject 0.3 mg into the muscle once.  0    furosemide  (LASIX ) 20 MG tablet Take 20 mg by mouth daily as needed for edema.      levocetirizine (XYZAL ALLERGY 24HR) 5 MG tablet Take 5 mg by mouth daily as needed for allergies.      lidocaine  (LIDODERM ) 5 % Apply 3 patches to skin once daily as directed-remove after 12 hours (Patient taking differently: Place 1-4 patches onto the skin daily as needed (pain.).) 270 patch 0    lisdexamfetamine (VYVANSE ) 50 MG capsule Take 50 mg by mouth in the morning.      losartan -hydrochlorothiazide  (HYZAAR) 100-12.5 MG tablet Take 1 tablet by mouth in the morning.      Multiple Vitamins-Minerals (BARIATRIC MULTIVITAMINS/IRON PO) Take 2 tablets by mouth in the morning.      naloxegol  oxalate (MOVANTIK ) 25 MG TABS tablet Take 1 tablet (25 mg total) by mouth daily. 30 tablet 2    oxyCODONE -acetaminophen  (PERCOCET) 10-325 MG tablet Take 1 tablet by mouth every 6 (six) hours as directed 06/21/24 120 tablet 0    polyethylene glycol (MIRALAX ) 17 g packet Take 17 g by mouth daily. (Patient taking differently: Take 17 g by mouth daily as needed (constipation.).)      pregabalin  (LYRICA ) 75 MG capsule Take 1 capsule (75 mg total) by mouth 3 (three) times daily. 270 capsule 0    rosuvastatin  (CRESTOR ) 10 MG tablet Take 1 tablet (10 mg total) by mouth daily. 90 tablet 1    Vitamin D -Vitamin K (K2 PLUS D3 PO) Take 2 capsules by mouth in the morning.      zolpidem  (AMBIEN ) 10 MG tablet Take 10 mg by mouth at bedtime as needed for  sleep.      No current facility-administered medications for this visit.   Allergies  Allergen Reactions   Gadolinium Derivatives Nausea And Vomiting and Other (See Comments)    Pt describes this happens every time she gets gado even with slow injection    Daptomycin  Other (See Comments)    Altered mental status   Codeine Itching, Nausea Only and Other (See Comments)    Tolerates oxycodone     Duloxetine  altered mental status    Social History   Tobacco Use   Smoking status: Never   Smokeless tobacco: Never  Substance Use Topics   Alcohol  use: No    Alcohol /week: 0.0 standard drinks of alcohol     Family History  Problem Relation Age of Onset   Diabetes Father    Cancer Father        COLON   Heart disease Father    Kidney disease Father        dialysis   High blood pressure Father    Heart disease Sister    Diabetes Sister    Heart failure Sister    Heart failure Sister    Heart disease Sister    Cancer Maternal Grandmother        UTERINE   Cancer Paternal Grandmother        breast   Breast cancer Paternal Grandmother    Breast cancer Cousin       Review of Systems  Musculoskeletal:  Positive for arthralgias.  All other systems reviewed and are negative.   Objective:  Physical Exam Constitutional:      General: She is not in acute distress.    Appearance: Normal appearance. She is not ill-appearing.  HENT:     Head: Normocephalic and atraumatic.     Right Ear: External ear normal.     Left Ear: External ear normal.     Nose: Nose normal.     Mouth/Throat:     Mouth: Mucous membranes are moist.     Pharynx: Oropharynx is clear.  Eyes:     Extraocular Movements: Extraocular movements intact.     Conjunctiva/sclera: Conjunctivae normal.  Cardiovascular:     Rate and Rhythm: Normal rate and regular rhythm.     Pulses: Normal pulses.     Heart sounds: Normal heart sounds.  Pulmonary:     Effort: Pulmonary effort is normal.     Breath sounds:  Normal breath sounds.  Abdominal:     General: Bowel sounds are normal.     Palpations: Abdomen is soft.     Tenderness: There is no abdominal tenderness.  Musculoskeletal:        General: Tenderness present.     Cervical back: Normal range of motion and neck supple.     Comments: Well healed incision lateral-posterior aspect of left hip.  TTP over groin, lateral aspect, greater trochanter.  Mild IT band tenderness.  No significant swelling.  Otherwise no overlying lesions of area of chief complaint.  Decreased strength and ROM due to elicited pain.  Dorsiflexion and plantarflexion intact.  BLE appear grossly neurovascularly intact.  Gait mildly antalgic with use of walker.   Skin:    General: Skin is warm and dry.  Neurological:     Mental Status: She is alert and oriented to person, place, and time. Mental status is at baseline.  Psychiatric:        Mood and Affect: Mood normal.        Behavior: Behavior normal.     Vital signs in last 24 hours: @VSRANGES @   Labs:   Estimated body mass index is 34.59 kg/m as calculated from the following:   Height as of an earlier encounter on 06/26/24: 5' 11 (1.803 m).   Weight as of an earlier encounter on 06/26/24: 112.5 kg.  Imaging Review:  Plain radiographs demonstrate antibiotic spacer components of the right hip(s) in good position without adverse features.  The bone quality appears to  be fair for age and reported activity level.      Assessment/Plan:  Chronic hip joint infection, right hip(s) with failed previous arthroplasty; stage 2 revision  The patient history, physical examination, clinical judgement of the provider and imaging studies are consistent with chronic infection of the right hip(s), previous total hip arthroplasty and first stage revision. Revision total hip arthroplasty stage 2 removal of antibiotic spacer and placement of revision total hip replacement is deemed medically necessary. The treatment options including  medical management, injection therapy, arthroscopy and arthroplasty were discussed at length. The risks and benefits of total hip arthroplasty were presented and reviewed. The risks due to aseptic loosening, infection, stiffness, dislocation/subluxation,  thromboembolic complications and other imponderables were discussed.  The patient acknowledged the explanation, agreed to proceed with the plan and consent was signed. Patient is being admitted for inpatient treatment for surgery, pain control, PT, OT, prophylactic antibiotics, VTE prophylaxis, progressive ambulation and ADL's and discharge planning. The patient is planning to be discharged home with home health services

## 2024-06-28 DIAGNOSIS — Z96653 Presence of artificial knee joint, bilateral: Secondary | ICD-10-CM | POA: Diagnosis not present

## 2024-06-28 DIAGNOSIS — M4726 Other spondylosis with radiculopathy, lumbar region: Secondary | ICD-10-CM | POA: Diagnosis not present

## 2024-06-28 DIAGNOSIS — E559 Vitamin D deficiency, unspecified: Secondary | ICD-10-CM | POA: Diagnosis not present

## 2024-06-28 DIAGNOSIS — J302 Other seasonal allergic rhinitis: Secondary | ICD-10-CM | POA: Diagnosis not present

## 2024-06-28 DIAGNOSIS — R7303 Prediabetes: Secondary | ICD-10-CM | POA: Diagnosis not present

## 2024-06-28 DIAGNOSIS — T8452XD Infection and inflammatory reaction due to internal left hip prosthesis, subsequent encounter: Secondary | ICD-10-CM | POA: Diagnosis not present

## 2024-06-28 DIAGNOSIS — Z86718 Personal history of other venous thrombosis and embolism: Secondary | ICD-10-CM | POA: Diagnosis not present

## 2024-06-28 DIAGNOSIS — M5126 Other intervertebral disc displacement, lumbar region: Secondary | ICD-10-CM | POA: Diagnosis not present

## 2024-06-28 DIAGNOSIS — M179 Osteoarthritis of knee, unspecified: Secondary | ICD-10-CM | POA: Diagnosis not present

## 2024-06-28 DIAGNOSIS — M5418 Radiculopathy, sacral and sacrococcygeal region: Secondary | ICD-10-CM | POA: Diagnosis not present

## 2024-06-28 DIAGNOSIS — J329 Chronic sinusitis, unspecified: Secondary | ICD-10-CM | POA: Diagnosis not present

## 2024-06-28 DIAGNOSIS — T84091D Other mechanical complication of internal left hip prosthesis, subsequent encounter: Secondary | ICD-10-CM | POA: Diagnosis not present

## 2024-06-28 DIAGNOSIS — K219 Gastro-esophageal reflux disease without esophagitis: Secondary | ICD-10-CM | POA: Diagnosis not present

## 2024-06-28 DIAGNOSIS — E785 Hyperlipidemia, unspecified: Secondary | ICD-10-CM | POA: Diagnosis not present

## 2024-06-28 DIAGNOSIS — G894 Chronic pain syndrome: Secondary | ICD-10-CM | POA: Diagnosis not present

## 2024-06-28 DIAGNOSIS — G4700 Insomnia, unspecified: Secondary | ICD-10-CM | POA: Diagnosis not present

## 2024-06-28 DIAGNOSIS — G709 Myoneural disorder, unspecified: Secondary | ICD-10-CM | POA: Diagnosis not present

## 2024-06-28 DIAGNOSIS — Z556 Problems related to health literacy: Secondary | ICD-10-CM | POA: Diagnosis not present

## 2024-06-28 DIAGNOSIS — I10 Essential (primary) hypertension: Secondary | ICD-10-CM | POA: Diagnosis not present

## 2024-06-28 NOTE — Progress Notes (Addendum)
 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   Prior complication from anesthesia includes PONV, AMS of unknown etiology  Patient had R THA in 2017. She developed a chronic joint infection and underwent 1st stage of R THA revision on 04/04/24. Now scheduled for 2nd stage.   Hospitalization after 7/16 surgery was complicated by prolonged AMS and lethargy. Rapid response was called while on the floor. W/u including CBC, BMP, blood gas, ammonia, chest x-ray, and CT head were largely unremarkable. Neurology and Psychiatry were also consulted who felt presentation was consistent with delirium. Per Neurology: This could be very slow recovery from anesthesia however less likely due to previously tolerating anesthesia very well. Situational depression seems relatively likely based upon her overall exam findings.  Per cardiology preoperative evaluation 02/27/2024, According to the Revised Cardiac Risk Index (RCRI), her Perioperative Risk of Major Cardiac Event is (%): 0.4. Her Functional Capacity in METs is: 6.27 according to the Duke Activity Status Index (DASI). Therefore, based on ACC/AHA guidelines, patient would be at acceptable risk for the planned procedure without further cardiovascular testing.   VS: BP 127/84   Temp 37.1 C (Oral)   Resp 16   Ht 5' 11 (1.803 m)   Wt 112.5 kg   SpO2 100%   BMI 34.59 kg/m   PROVIDERS: Turmel, Caleb P, PA is PCP   LABS: Labs reviewed: Acceptable for surgery. (all labs ordered are listed, but only  abnormal results are displayed)  Labs Reviewed  SURGICAL PCR SCREEN - Abnormal; Notable for the following components:      Result Value   Staphylococcus aureus POSITIVE (*)    All other components within normal limits  BASIC METABOLIC PANEL WITH GFR - Abnormal; Notable for the following components:   Glucose, Bld 61 (*)    All other components within normal limits  CBC - Abnormal; Notable for the following components:   Hemoglobin 11.7 (*)    MCH 25.4 (*)    RDW 15.9 (*)    All other components within normal limits  TYPE AND SCREEN    Exercise treadmill stress test 05/20/2023: Exercise treadmill stress test performed using Bruce protocol.  Patient exercised for a total of 4 minutes and 10 seconds, achieving 6.0 METS, and 83% of age predicted maximum heart rate.  Exercise capacity was low.  No chest pain reported.  Near optimal heart rate and hemodynamic response. Stress EKG revealed no ischemic changes. Low risk study.   Echocardiogram 04/12/2023:  Normal LV systolic function with visual EF 60-65%. Left ventricle cavity  is normal in size. Normal global wall motion. Normal diastolic filling  pattern, normal LAP. Mild left ventricular hypertrophy.  Pericardium is normal. Trace pericardial effusion. There is no hemodynamic  significance.  No significant valvular heart disease.  No prior study for comparison.   Past Medical History:  Diagnosis Date   ADHD (attention deficit hyperactivity disorder)    Anemia    Anxiety    Arthritis    knees, back   Back pain    Carpal  tunnel syndrome of right wrist 07/2014   Complication of anesthesia    Coronary artery disease    Depression    History of pneumonia    Hypertension    Infertility, female    Joint pain    Lumbar spondylosis    Neuromuscular disease (HCC)    numbness, paresthesias RLE, from failed lumbar back surgery   Osteoarthritis    PCOS (polycystic ovarian syndrome)    Pneumonia    2016   PONV (postoperative nausea  and vomiting)    Almost a week to wake up   Pre-diabetes    Swelling of lower extremity     Past Surgical History:  Procedure Laterality Date   ABDOMINAL EXPOSURE N/A 02/17/2018   Procedure: ABDOMINAL EXPOSURE;  Surgeon: Oris Krystal FALCON, MD;  Location: [facility];  Service: Vascular;  Laterality: N/A;   ANKLE SURGERY Bilateral    x 2   ANTERIOR LAT LUMBAR FUSION N/A 02/17/2018   Procedure: Lumbar three-four Lumbar four-five  Anterolateral lumbar interbody fusion;  Surgeon: Colon Shove, MD;  Location: [facility];  Service: Neurosurgery;  Laterality: N/A;   ANTERIOR LUMBAR FUSION N/A 02/17/2018   Procedure: Lumbar five-Sacral one Anterior lumbar interbody fusion;  Surgeon: Colon Shove, MD;  Location: [facility] OR;  Service: Neurosurgery;  Laterality: N/A;   APPLICATION OF ROBOTIC ASSISTANCE FOR SPINAL PROCEDURE N/A 02/17/2018   Procedure: APPLICATION OF ROBOTIC ASSISTANCE FOR SPINAL PROCEDURE;  Surgeon: Colon Shove, MD;  Location: [facility];  Service: Neurosurgery;  Laterality: N/A;   APPLICATION OF ROBOTIC ASSISTANCE FOR SPINAL PROCEDURE N/A 06/09/2018   Procedure: APPLICATION OF ROBOTIC ASSISTANCE FOR SPINAL PROCEDURE;  Surgeon: Colon Shove, MD;  [facility]: [facility];  Service: Neurosurgery;  Laterality: N/A;   BACK SURGERY     2017  discectomy   CARPAL TUNNEL RELEASE Left 07/04/2014   Procedure: LEFT CARPAL TUNNEL RELEASE;  Surgeon: Toribio FALCON Chancy, MD;  Location: [facility];  Service: Orthopedics;  Laterality: Left;   CARPAL TUNNEL RELEASE Right 08/08/2014   Procedure: RIGHT CARPAL TUNNEL RELEASE;  Surgeon: Toribio FALCON Chancy, MD;  Location: [facility];  Service: Orthopedics;  Laterality: Right;   COLONOSCOPY  2024   DILATION AND CURETTAGE OF UTERUS     DILATION AND EVACUATION  04/02/2011   Procedure: DILATATION AND EVACUATION (D&E);  Surgeon: Alm JAYSON Cook, MD;  Location: [facility];  Service: Gynecology;  Laterality: N/A;  dvt left mid thigh   DORSAL COMPARTMENT RELEASE Left  07/04/2014   Procedure: LEFT DEQUERVAINS;  Surgeon: Toribio FALCON Chancy, MD;  Location: [facility];  Service: Orthopedics;  Laterality: Left;   ENDOSCOPIC PLANTAR FASCIOTOMY Left 02/01/2002   HARDWARE REMOVAL Right 06/09/2018   Procedure: Repositioning of Right Lumbar three Right lumbar  four pedicle screw with MAZOR;  Surgeon: Colon Shove, MD;  Location: [facility] OR;  Service: Neurosurgery;  Laterality: Right;   HYSTEROSCOPY WITH D & C  07/17/2010   with exc. endometrial polyps   KNEE ARTHROSCOPY Right 03/07/2003; 01/14/2005; 08/31/2006   KNEE ARTHROSCOPY Left 03/21/2003; 11/26/2004   LUMBAR PERCUTANEOUS PEDICLE SCREW 3 LEVEL N/A 02/17/2018   Procedure: LUMBAR PERCUTANEOUS PEDICLE SCREW PLACEMENT LUMBAR THREE-SACRAL ONE;  Surgeon: Colon Shove, MD;  [facility];  Service: Neurosurgery;  Laterality: N/A;   SHOULDER ARTHROSCOPY Right    TOE SURGERY Left 01/10/2003   claw toe correction 2nd, 3rd, 4th toes   TOTAL HIP ARTHROPLASTY Right 04/21/2016   Procedure: TOTAL HIP ARTHROPLASTY ANTERIOR APPROACH;  Surgeon: Toribio FALCON  Beverley, MD;  Location: [facility];  Service: Orthopedics;  Laterality: Right;   TOTAL HIP ARTHROPLASTY WITH HARDWARE REMOVAL Right 04/04/2024   Procedure: REVISION, ARTHROPLASTY, HIP;  Surgeon: Edna Toribio LABOR, MD;  [facility];  Service: Orthopedics;  Laterality: Right;   TOTAL KNEE ARTHROPLASTY Left 01/21/2010   TOTAL KNEE ARTHROPLASTY Right 08/26/2008   UPPER GI ENDOSCOPY N/A 04/14/2021   Procedure: UPPER GI ENDOSCOPY;  Surgeon: Gladis Cough, MD;  Location: [facility];  Service: General;  Laterality: N/A;    MEDICATIONS:  Albuterol  Sulfate (PROAIR  RESPICLICK) 108 (90 Base) MCG/ACT AEPB   baclofen  (LIORESAL ) 10 MG tablet   buPROPion  (WELLBUTRIN  XL) 150 MG 24 hr tablet   calcium  carbonate (TUMS EX) 750 MG chewable tablet   carvedilol  (COREG ) 6.25 MG tablet   docusate sodium  (COLACE) 100 MG capsule   EPINEPHrine  0.3 mg/0.3 mL IJ SOAJ injection   furosemide   (LASIX ) 20 MG tablet   levocetirizine (XYZAL ALLERGY 24HR) 5 MG tablet   lidocaine  (LIDODERM ) 5 %   lisdexamfetamine (VYVANSE ) 50 MG capsule   losartan -hydrochlorothiazide  (HYZAAR) 100-12.5 MG tablet   Multiple Vitamins-Minerals (BARIATRIC MULTIVITAMINS/IRON PO)   naloxegol  oxalate (MOVANTIK ) 25 MG TABS tablet   oxyCODONE -acetaminophen  (PERCOCET) 10-325 MG tablet   polyethylene glycol (MIRALAX ) 17 g packet   pregabalin  (LYRICA ) 75 MG capsule   rosuvastatin  (CRESTOR ) 10 MG tablet   Vitamin D -Vitamin K (K2 PLUS D3 PO)   zolpidem  (AMBIEN ) 10 MG tablet   No current facility-administered medications for this encounter.   Burnard CHRISTELLA Odis DEVONNA [facility] Short Stay/Anesthesiology [facility] Phone [phone] 07/03/2024 1:42 PM

## 2024-07-02 DIAGNOSIS — K219 Gastro-esophageal reflux disease without esophagitis: Secondary | ICD-10-CM | POA: Diagnosis not present

## 2024-07-02 DIAGNOSIS — Z96653 Presence of artificial knee joint, bilateral: Secondary | ICD-10-CM | POA: Diagnosis not present

## 2024-07-02 DIAGNOSIS — M5126 Other intervertebral disc displacement, lumbar region: Secondary | ICD-10-CM | POA: Diagnosis not present

## 2024-07-02 DIAGNOSIS — Z556 Problems related to health literacy: Secondary | ICD-10-CM | POA: Diagnosis not present

## 2024-07-02 DIAGNOSIS — R7303 Prediabetes: Secondary | ICD-10-CM | POA: Diagnosis not present

## 2024-07-02 DIAGNOSIS — J302 Other seasonal allergic rhinitis: Secondary | ICD-10-CM | POA: Diagnosis not present

## 2024-07-02 DIAGNOSIS — E785 Hyperlipidemia, unspecified: Secondary | ICD-10-CM | POA: Diagnosis not present

## 2024-07-02 DIAGNOSIS — G4700 Insomnia, unspecified: Secondary | ICD-10-CM | POA: Diagnosis not present

## 2024-07-02 DIAGNOSIS — G894 Chronic pain syndrome: Secondary | ICD-10-CM | POA: Diagnosis not present

## 2024-07-02 DIAGNOSIS — T8452XD Infection and inflammatory reaction due to internal left hip prosthesis, subsequent encounter: Secondary | ICD-10-CM | POA: Diagnosis not present

## 2024-07-02 DIAGNOSIS — H34233 Retinal artery branch occlusion, bilateral: Secondary | ICD-10-CM | POA: Diagnosis not present

## 2024-07-02 DIAGNOSIS — M5418 Radiculopathy, sacral and sacrococcygeal region: Secondary | ICD-10-CM | POA: Diagnosis not present

## 2024-07-02 DIAGNOSIS — Z86718 Personal history of other venous thrombosis and embolism: Secondary | ICD-10-CM | POA: Diagnosis not present

## 2024-07-02 DIAGNOSIS — G709 Myoneural disorder, unspecified: Secondary | ICD-10-CM | POA: Diagnosis not present

## 2024-07-02 DIAGNOSIS — M4726 Other spondylosis with radiculopathy, lumbar region: Secondary | ICD-10-CM | POA: Diagnosis not present

## 2024-07-02 DIAGNOSIS — T84091D Other mechanical complication of internal left hip prosthesis, subsequent encounter: Secondary | ICD-10-CM | POA: Diagnosis not present

## 2024-07-02 DIAGNOSIS — I10 Essential (primary) hypertension: Secondary | ICD-10-CM | POA: Diagnosis not present

## 2024-07-02 DIAGNOSIS — M179 Osteoarthritis of knee, unspecified: Secondary | ICD-10-CM | POA: Diagnosis not present

## 2024-07-02 DIAGNOSIS — E559 Vitamin D deficiency, unspecified: Secondary | ICD-10-CM | POA: Diagnosis not present

## 2024-07-02 DIAGNOSIS — J329 Chronic sinusitis, unspecified: Secondary | ICD-10-CM | POA: Diagnosis not present

## 2024-07-03 DIAGNOSIS — G8929 Other chronic pain: Secondary | ICD-10-CM | POA: Diagnosis not present

## 2024-07-03 DIAGNOSIS — Z556 Problems related to health literacy: Secondary | ICD-10-CM | POA: Diagnosis not present

## 2024-07-03 DIAGNOSIS — K219 Gastro-esophageal reflux disease without esophagitis: Secondary | ICD-10-CM | POA: Diagnosis not present

## 2024-07-03 DIAGNOSIS — R7303 Prediabetes: Secondary | ICD-10-CM | POA: Diagnosis not present

## 2024-07-03 DIAGNOSIS — Z96642 Presence of left artificial hip joint: Secondary | ICD-10-CM | POA: Diagnosis not present

## 2024-07-03 DIAGNOSIS — Z87898 Personal history of other specified conditions: Secondary | ICD-10-CM | POA: Diagnosis not present

## 2024-07-03 DIAGNOSIS — I10 Essential (primary) hypertension: Secondary | ICD-10-CM | POA: Diagnosis not present

## 2024-07-03 DIAGNOSIS — G709 Myoneural disorder, unspecified: Secondary | ICD-10-CM | POA: Diagnosis not present

## 2024-07-03 DIAGNOSIS — G4700 Insomnia, unspecified: Secondary | ICD-10-CM | POA: Diagnosis not present

## 2024-07-03 DIAGNOSIS — J302 Other seasonal allergic rhinitis: Secondary | ICD-10-CM | POA: Diagnosis not present

## 2024-07-03 DIAGNOSIS — R413 Other amnesia: Secondary | ICD-10-CM | POA: Diagnosis not present

## 2024-07-03 DIAGNOSIS — J329 Chronic sinusitis, unspecified: Secondary | ICD-10-CM | POA: Diagnosis not present

## 2024-07-03 DIAGNOSIS — Z86718 Personal history of other venous thrombosis and embolism: Secondary | ICD-10-CM | POA: Diagnosis not present

## 2024-07-03 DIAGNOSIS — T84091D Other mechanical complication of internal left hip prosthesis, subsequent encounter: Secondary | ICD-10-CM | POA: Diagnosis not present

## 2024-07-03 DIAGNOSIS — G894 Chronic pain syndrome: Secondary | ICD-10-CM | POA: Diagnosis not present

## 2024-07-03 DIAGNOSIS — F8081 Childhood onset fluency disorder: Secondary | ICD-10-CM | POA: Diagnosis not present

## 2024-07-03 DIAGNOSIS — M5126 Other intervertebral disc displacement, lumbar region: Secondary | ICD-10-CM | POA: Diagnosis not present

## 2024-07-03 DIAGNOSIS — E559 Vitamin D deficiency, unspecified: Secondary | ICD-10-CM | POA: Diagnosis not present

## 2024-07-03 DIAGNOSIS — E785 Hyperlipidemia, unspecified: Secondary | ICD-10-CM | POA: Diagnosis not present

## 2024-07-03 DIAGNOSIS — T8452XD Infection and inflammatory reaction due to internal left hip prosthesis, subsequent encounter: Secondary | ICD-10-CM | POA: Diagnosis not present

## 2024-07-03 DIAGNOSIS — Z96653 Presence of artificial knee joint, bilateral: Secondary | ICD-10-CM | POA: Diagnosis not present

## 2024-07-03 DIAGNOSIS — M4726 Other spondylosis with radiculopathy, lumbar region: Secondary | ICD-10-CM | POA: Diagnosis not present

## 2024-07-03 DIAGNOSIS — M5418 Radiculopathy, sacral and sacrococcygeal region: Secondary | ICD-10-CM | POA: Diagnosis not present

## 2024-07-03 DIAGNOSIS — M179 Osteoarthritis of knee, unspecified: Secondary | ICD-10-CM | POA: Diagnosis not present

## 2024-07-03 NOTE — Anesthesia Preprocedure Evaluation (Addendum)
 Anesthesia Evaluation  Patient identified by MRN, date of birth, ID band Patient awake    Reviewed: Allergy & Precautions, NPO status , Patient's Chart, lab work & pertinent test results  History of Anesthesia Complications (+) PONV and history of anesthetic complications  Airway Mallampati: II  TM Distance: >3 FB Neck ROM: Full    Dental no notable dental hx. (+) Teeth Intact, Dental Advisory Given   Pulmonary neg pulmonary ROS   Pulmonary exam normal breath sounds clear to auscultation       Cardiovascular hypertension, Pt. on medications and Pt. on home beta blockers (-) angina + CAD  (-) Past MI Normal cardiovascular exam Rhythm:Regular Rate:Normal  Echocardiogram 04/12/2023:  Normal LV systolic function with visual EF 60-65%. Left ventricle cavity  is normal in size. Normal global wall motion. Normal diastolic filling  pattern, normal LAP. Mild left ventricular hypertrophy.  Pericardium is normal. Trace pericardial effusion. There is no hemodynamic  significance.  No significant valvular heart disease.  No prior study for comparison.     Neuro/Psych  PSYCHIATRIC DISORDERS Anxiety Depression     Neuromuscular disease    GI/Hepatic negative GI ROS, Neg liver ROS,,,  Endo/Other    Renal/GU Lab Results      Component                Value               Date                      NA                       138                 06/26/2024                CL                       103                 06/26/2024                K                        3.9                 06/26/2024                CO2                      27                  06/26/2024                BUN                      13                  06/26/2024                CREATININE               0.81                06/26/2024                GFRNONAA                 >  60                 06/26/2024                CALCIUM                   9.3                  06/26/2024                            Musculoskeletal  (+) Arthritis ,   chronic pain syndrome with narcotic dependence.  hx of posterior fusion L3-S1      Abdominal   Peds  Hematology  (+) Blood dyscrasia, anemia Lab Results      Component                Value               Date                      WBC                      6.7                 06/26/2024                HGB                      11.7 (L)            06/26/2024                HCT                      38.0                06/26/2024                MCV                      82.4                06/26/2024                PLT                      237                 06/26/2024              Anesthesia Other Findings All: daptomycin , codeine, duloxetine  Reproductive/Obstetrics                              Anesthesia Physical Anesthesia Plan  ASA: 2  Anesthesia Plan: Spinal and General   Post-op Pain Management: Tylenol  PO (pre-op)*   Induction:   PONV Risk Score and Plan: Midazolam , Dexamethasone , Ondansetron , Propofol  infusion, TIVA and Treatment may vary due to age or medical condition  Airway Management Planned: Nasal Cannula and Natural Airway  Additional Equipment: None  Intra-op Plan:   Post-operative Plan:   Informed Consent:      Dental advisory given  Plan Discussed with:   Anesthesia Plan Comments: (See PAT note from 10/7  Spinal )  Anesthesia Quick Evaluation

## 2024-07-05 ENCOUNTER — Other Ambulatory Visit (HOSPITAL_COMMUNITY): Payer: Self-pay

## 2024-07-06 DIAGNOSIS — M5126 Other intervertebral disc displacement, lumbar region: Secondary | ICD-10-CM | POA: Diagnosis not present

## 2024-07-06 DIAGNOSIS — M24851 Other specific joint derangements of right hip, not elsewhere classified: Secondary | ICD-10-CM | POA: Diagnosis not present

## 2024-07-09 ENCOUNTER — Inpatient Hospital Stay (HOSPITAL_COMMUNITY)
Admission: RE | Admit: 2024-07-09 | Discharge: 2024-07-14 | DRG: 467 | Disposition: A | Discharge status: Home Health | Attending: Orthopedic Surgery | Admitting: Orthopedic Surgery

## 2024-07-09 ENCOUNTER — Other Ambulatory Visit: Payer: Self-pay

## 2024-07-09 ENCOUNTER — Inpatient Hospital Stay (HOSPITAL_COMMUNITY)

## 2024-07-09 ENCOUNTER — Encounter (HOSPITAL_COMMUNITY): Payer: Self-pay | Admitting: Orthopedic Surgery

## 2024-07-09 ENCOUNTER — Other Ambulatory Visit (HOSPITAL_COMMUNITY): Payer: Self-pay

## 2024-07-09 ENCOUNTER — Inpatient Hospital Stay (HOSPITAL_COMMUNITY): Payer: Self-pay | Admitting: Medical

## 2024-07-09 ENCOUNTER — Inpatient Hospital Stay (HOSPITAL_COMMUNITY): Admitting: Anesthesiology

## 2024-07-09 ENCOUNTER — Encounter (HOSPITAL_COMMUNITY)
Admission: RE | Disposition: A | Discharge status: Home Health | Payer: Self-pay | Source: Home / Self Care | Attending: Orthopedic Surgery

## 2024-07-09 DIAGNOSIS — G894 Chronic pain syndrome: Secondary | ICD-10-CM | POA: Diagnosis present

## 2024-07-09 DIAGNOSIS — T8451XA Infection and inflammatory reaction due to internal right hip prosthesis, initial encounter: Secondary | ICD-10-CM

## 2024-07-09 DIAGNOSIS — Z8249 Family history of ischemic heart disease and other diseases of the circulatory system: Secondary | ICD-10-CM | POA: Diagnosis not present

## 2024-07-09 DIAGNOSIS — E559 Vitamin D deficiency, unspecified: Secondary | ICD-10-CM | POA: Diagnosis present

## 2024-07-09 DIAGNOSIS — Y831 Surgical operation with implant of artificial internal device as the cause of abnormal reaction of the patient, or of later complication, without mention of misadventure at the time of the procedure: Secondary | ICD-10-CM | POA: Diagnosis present

## 2024-07-09 DIAGNOSIS — Z6834 Body mass index (BMI) 34.0-34.9, adult: Secondary | ICD-10-CM

## 2024-07-09 DIAGNOSIS — D62 Acute posthemorrhagic anemia: Secondary | ICD-10-CM | POA: Diagnosis not present

## 2024-07-09 DIAGNOSIS — G8929 Other chronic pain: Secondary | ICD-10-CM

## 2024-07-09 DIAGNOSIS — Z888 Allergy status to other drugs, medicaments and biological substances status: Secondary | ICD-10-CM

## 2024-07-09 DIAGNOSIS — Z881 Allergy status to other antibiotic agents status: Secondary | ICD-10-CM | POA: Diagnosis not present

## 2024-07-09 DIAGNOSIS — Z471 Aftercare following joint replacement surgery: Secondary | ICD-10-CM | POA: Diagnosis not present

## 2024-07-09 DIAGNOSIS — Z9884 Bariatric surgery status: Secondary | ICD-10-CM | POA: Diagnosis not present

## 2024-07-09 DIAGNOSIS — Z803 Family history of malignant neoplasm of breast: Secondary | ICD-10-CM

## 2024-07-09 DIAGNOSIS — Z981 Arthrodesis status: Secondary | ICD-10-CM | POA: Diagnosis not present

## 2024-07-09 DIAGNOSIS — I10 Essential (primary) hypertension: Secondary | ICD-10-CM | POA: Diagnosis present

## 2024-07-09 DIAGNOSIS — E66811 Obesity, class 1: Secondary | ICD-10-CM | POA: Diagnosis present

## 2024-07-09 DIAGNOSIS — Z96649 Presence of unspecified artificial hip joint: Principal | ICD-10-CM

## 2024-07-09 DIAGNOSIS — Z79899 Other long term (current) drug therapy: Secondary | ICD-10-CM | POA: Diagnosis not present

## 2024-07-09 DIAGNOSIS — F909 Attention-deficit hyperactivity disorder, unspecified type: Secondary | ICD-10-CM | POA: Diagnosis present

## 2024-07-09 DIAGNOSIS — T8451XD Infection and inflammatory reaction due to internal right hip prosthesis, subsequent encounter: Secondary | ICD-10-CM | POA: Diagnosis present

## 2024-07-09 DIAGNOSIS — Z4732 Aftercare following explantation of hip joint prosthesis: Secondary | ICD-10-CM | POA: Diagnosis not present

## 2024-07-09 DIAGNOSIS — Z885 Allergy status to narcotic agent status: Secondary | ICD-10-CM | POA: Diagnosis not present

## 2024-07-09 DIAGNOSIS — Z8419 Family history of other disorders of kidney and ureter: Secondary | ICD-10-CM

## 2024-07-09 DIAGNOSIS — Z96642 Presence of left artificial hip joint: Secondary | ICD-10-CM | POA: Diagnosis present

## 2024-07-09 DIAGNOSIS — I2510 Atherosclerotic heart disease of native coronary artery without angina pectoris: Secondary | ICD-10-CM | POA: Diagnosis not present

## 2024-07-09 DIAGNOSIS — Z96641 Presence of right artificial hip joint: Secondary | ICD-10-CM | POA: Diagnosis not present

## 2024-07-09 DIAGNOSIS — E785 Hyperlipidemia, unspecified: Secondary | ICD-10-CM | POA: Diagnosis present

## 2024-07-09 DIAGNOSIS — Z833 Family history of diabetes mellitus: Secondary | ICD-10-CM

## 2024-07-09 DIAGNOSIS — Z91013 Allergy to seafood: Secondary | ICD-10-CM

## 2024-07-09 DIAGNOSIS — Z96653 Presence of artificial knee joint, bilateral: Secondary | ICD-10-CM | POA: Diagnosis present

## 2024-07-09 DIAGNOSIS — T84018A Broken internal joint prosthesis, other site, initial encounter: Principal | ICD-10-CM

## 2024-07-09 LAB — CBC WITH DIFFERENTIAL/PLATELET
Abs Immature Granulocytes: 0.02 K/uL (ref 0.00–0.07)
Basophils Absolute: 0.1 K/uL (ref 0.0–0.1)
Basophils Relative: 1 %
Eosinophils Absolute: 0.2 K/uL (ref 0.0–0.5)
Eosinophils Relative: 2 %
HCT: 39.9 % (ref 36.0–46.0)
Hemoglobin: 12 g/dL (ref 12.0–15.0)
Immature Granulocytes: 0 %
Lymphocytes Relative: 29 %
Lymphs Abs: 2.2 K/uL (ref 0.7–4.0)
MCH: 24.4 pg — ABNORMAL LOW (ref 26.0–34.0)
MCHC: 30.1 g/dL (ref 30.0–36.0)
MCV: 81.1 fL (ref 80.0–100.0)
Monocytes Absolute: 0.6 K/uL (ref 0.1–1.0)
Monocytes Relative: 8 %
Neutro Abs: 4.7 K/uL (ref 1.7–7.7)
Neutrophils Relative %: 60 %
Platelets: 192 K/uL (ref 150–400)
RBC: 4.92 MIL/uL (ref 3.87–5.11)
RDW: 15.8 % — ABNORMAL HIGH (ref 11.5–15.5)
WBC: 7.8 K/uL (ref 4.0–10.5)
nRBC: 0 % (ref 0.0–0.2)

## 2024-07-09 LAB — COMPREHENSIVE METABOLIC PANEL WITH GFR
ALT: 8 U/L (ref 0–44)
AST: 22 U/L (ref 15–41)
Albumin: 4.1 g/dL (ref 3.5–5.0)
Alkaline Phosphatase: 80 U/L (ref 38–126)
Anion gap: 11 (ref 5–15)
BUN: 14 mg/dL (ref 6–20)
CO2: 25 mmol/L (ref 22–32)
Calcium: 9.5 mg/dL (ref 8.9–10.3)
Chloride: 102 mmol/L (ref 98–111)
Creatinine, Ser: 0.75 mg/dL (ref 0.44–1.00)
GFR, Estimated: >60 mL/min (ref >60)
Glucose, Bld: 71 mg/dL (ref 70–99)
Potassium: 3.8 mmol/L (ref 3.5–5.1)
Sodium: 138 mmol/L (ref 135–145)
Total Bilirubin: 0.6 mg/dL (ref 0.0–1.2)
Total Protein: 7.6 g/dL (ref 6.5–8.1)

## 2024-07-09 LAB — POCT PREGNANCY, URINE: Preg Test, Ur: NEGATIVE

## 2024-07-09 SURGERY — REVISION, ARTHROPLASTY, HIP
Anesthesia: General | Site: Hip | Laterality: Right

## 2024-07-09 MED ORDER — METHOCARBAMOL 1000 MG/10ML IJ SOLN
INTRAMUSCULAR | Status: AC
  Filled 2024-07-09: qty 10

## 2024-07-09 MED ORDER — SODIUM CHLORIDE 0.9 % IV SOLN: INTRAVENOUS | Status: DC

## 2024-07-09 MED ORDER — LORATADINE 10 MG PO TABS: 10.0000 mg | ORAL_TABLET | Freq: Every day | ORAL | Status: DC | PRN

## 2024-07-09 MED ORDER — CARVEDILOL 6.25 MG PO TABS
6.2500 mg | ORAL_TABLET | Freq: Every day | ORAL | Status: DC
  Administered 2024-07-10 – 2024-07-14 (×5): 6.25 mg via ORAL
  Filled 2024-07-09 (×5): qty 1

## 2024-07-09 MED ORDER — ORAL CARE MOUTH RINSE: 15.0000 mL | Freq: Once | OROMUCOSAL | Status: AC

## 2024-07-09 MED ORDER — ONDANSETRON HCL 4 MG/2ML IJ SOLN
4.0000 mg | Freq: Four times a day (QID) | INTRAMUSCULAR | Status: DC | PRN
  Administered 2024-07-09 – 2024-07-12 (×2): 4 mg via INTRAVENOUS
  Filled 2024-07-09 (×2): qty 2

## 2024-07-09 MED ORDER — ROSUVASTATIN CALCIUM 10 MG PO TABS
10.0000 mg | ORAL_TABLET | Freq: Every day | ORAL | Status: DC
  Administered 2024-07-10 – 2024-07-14 (×5): 10 mg via ORAL
  Filled 2024-07-09 (×5): qty 1

## 2024-07-09 MED ORDER — ALBUTEROL SULFATE (2.5 MG/3ML) 0.083% IN NEBU: 2.5000 mg | INHALATION_SOLUTION | RESPIRATORY_TRACT | Status: DC | PRN

## 2024-07-09 MED ORDER — CEFAZOLIN SODIUM-DEXTROSE 2-4 GM/100ML-% IV SOLN
2.0000 g | INTRAVENOUS | Status: AC
  Administered 2024-07-09: 2 g via INTRAVENOUS
  Filled 2024-07-09: qty 100

## 2024-07-09 MED ORDER — DOCUSATE SODIUM 100 MG PO CAPS
100.0000 mg | ORAL_CAPSULE | Freq: Two times a day (BID) | ORAL | Status: DC
  Administered 2024-07-09 – 2024-07-14 (×10): 100 mg via ORAL
  Filled 2024-07-09 (×10): qty 1

## 2024-07-09 MED ORDER — ONDANSETRON HCL 4 MG/2ML IJ SOLN: 4.0000 mg | Freq: Once | INTRAMUSCULAR | Status: DC | PRN

## 2024-07-09 MED ORDER — POVIDONE-IODINE 10 % EX SWAB: 2.0000 | Freq: Once | CUTANEOUS | Status: DC

## 2024-07-09 MED ORDER — OXYCODONE HCL 5 MG/5ML PO SOLN: 5.0000 mg | Freq: Once | ORAL | Status: DC | PRN

## 2024-07-09 MED ORDER — LOSARTAN POTASSIUM-HCTZ 100-12.5 MG PO TABS: 1.0000 | ORAL_TABLET | Freq: Every morning | ORAL | Status: DC

## 2024-07-09 MED ORDER — LIDOCAINE 5 % EX PTCH: 1.0000 | MEDICATED_PATCH | Freq: Every day | CUTANEOUS | Status: DC | PRN

## 2024-07-09 MED ORDER — CHLORHEXIDINE GLUCONATE 4 % EX SOLN
1.0000 | CUTANEOUS | 1 refills | Status: AC
  Filled 2024-07-09: qty 946, 30d supply, fill #0

## 2024-07-09 MED ORDER — 0.9 % SODIUM CHLORIDE (POUR BTL) OPTIME
TOPICAL | Status: DC | PRN
  Administered 2024-07-09: 1000 mL

## 2024-07-09 MED ORDER — CALCIUM CARBONATE ANTACID 500 MG PO CHEW
500.0000 mg | CHEWABLE_TABLET | Freq: Every day | ORAL | Status: DC
  Administered 2024-07-10 – 2024-07-13 (×4): 200 mg via ORAL
  Filled 2024-07-09 (×4): qty 1

## 2024-07-09 MED ORDER — HYDROCHLOROTHIAZIDE 12.5 MG PO TABS
12.5000 mg | ORAL_TABLET | Freq: Every day | ORAL | Status: DC
  Administered 2024-07-10 – 2024-07-11 (×2): 12.5 mg via ORAL
  Filled 2024-07-09 (×4): qty 1

## 2024-07-09 MED ORDER — CEFAZOLIN SODIUM-DEXTROSE 2-4 GM/100ML-% IV SOLN
2.0000 g | Freq: Three times a day (TID) | INTRAVENOUS | Status: AC
  Administered 2024-07-09 – 2024-07-12 (×9): 2 g via INTRAVENOUS
  Filled 2024-07-09 (×9): qty 100

## 2024-07-09 MED ORDER — ZOLPIDEM TARTRATE 5 MG PO TABS: 5.0000 mg | ORAL_TABLET | Freq: Every evening | ORAL | Status: DC | PRN

## 2024-07-09 MED ORDER — MUPIROCIN 2 % EX OINT
1.0000 | TOPICAL_OINTMENT | Freq: Two times a day (BID) | CUTANEOUS | 0 refills | Status: AC
  Filled 2024-07-09: qty 22, 11d supply, fill #0

## 2024-07-09 MED ORDER — OXYCODONE HCL 5 MG PO TABS: 5.0000 mg | ORAL_TABLET | Freq: Once | ORAL | Status: DC | PRN

## 2024-07-09 MED ORDER — PROPOFOL 10 MG/ML IV BOLUS
INTRAVENOUS | Status: DC | PRN
  Administered 2024-07-09 (×3): 50 mg via INTRAVENOUS

## 2024-07-09 MED ORDER — PROPOFOL 500 MG/50ML IV EMUL
INTRAVENOUS | Status: AC
  Filled 2024-07-09: qty 100

## 2024-07-09 MED ORDER — HYDROMORPHONE HCL 1 MG/ML IJ SOLN
0.2500 mg | INTRAMUSCULAR | Status: DC | PRN
  Administered 2024-07-09: 0.25 mg via INTRAVENOUS
  Administered 2024-07-09 (×2): 0.5 mg via INTRAVENOUS

## 2024-07-09 MED ORDER — ACETAMINOPHEN 500 MG PO TABS
1000.0000 mg | ORAL_TABLET | Freq: Four times a day (QID) | ORAL | Status: AC
  Administered 2024-07-09 – 2024-07-10 (×4): 1000 mg via ORAL
  Filled 2024-07-09 (×4): qty 2

## 2024-07-09 MED ORDER — PANTOPRAZOLE SODIUM 40 MG PO TBEC
40.0000 mg | DELAYED_RELEASE_TABLET | Freq: Every day | ORAL | Status: DC
  Administered 2024-07-10 – 2024-07-14 (×5): 40 mg via ORAL
  Filled 2024-07-09 (×5): qty 1

## 2024-07-09 MED ORDER — ACETAMINOPHEN 325 MG PO TABS
325.0000 mg | ORAL_TABLET | Freq: Four times a day (QID) | ORAL | Status: DC | PRN
  Administered 2024-07-12 – 2024-07-14 (×3): 650 mg via ORAL
  Filled 2024-07-09 (×3): qty 2

## 2024-07-09 MED ORDER — LACTATED RINGERS IV SOLN: INTRAVENOUS | Status: DC | PRN

## 2024-07-09 MED ORDER — MIDAZOLAM HCL 2 MG/2ML IJ SOLN
INTRAMUSCULAR | Status: AC
  Filled 2024-07-09: qty 2

## 2024-07-09 MED ORDER — PROPOFOL 10 MG/ML IV BOLUS
INTRAVENOUS | Status: AC
  Filled 2024-07-09: qty 20

## 2024-07-09 MED ORDER — CHLORHEXIDINE GLUCONATE 0.12 % MT SOLN
15.0000 mL | Freq: Once | OROMUCOSAL | Status: AC
  Administered 2024-07-09: 15 mL via OROMUCOSAL

## 2024-07-09 MED ORDER — PREGABALIN 75 MG PO CAPS
75.0000 mg | ORAL_CAPSULE | Freq: Three times a day (TID) | ORAL | Status: DC
Start: 2024-07-09 — End: 2024-07-14
  Administered 2024-07-09 – 2024-07-14 (×14): 75 mg via ORAL
  Filled 2024-07-09 (×14): qty 1

## 2024-07-09 MED ORDER — MENTHOL 3 MG MT LOZG: 1.0000 | LOZENGE | OROMUCOSAL | Status: DC | PRN

## 2024-07-09 MED ORDER — TRANEXAMIC ACID-NACL 1000-0.7 MG/100ML-% IV SOLN
1000.0000 mg | INTRAVENOUS | Status: AC
Start: 2024-07-09 — End: 2024-07-09
  Administered 2024-07-09: 1000 mg via INTRAVENOUS
  Filled 2024-07-09: qty 100

## 2024-07-09 MED ORDER — POLYETHYLENE GLYCOL 3350 17 G PO PACK
17.0000 g | PACK | Freq: Every day | ORAL | Status: DC | PRN
  Administered 2024-07-11: 17 g via ORAL
  Filled 2024-07-09: qty 1

## 2024-07-09 MED ORDER — ACETAMINOPHEN 500 MG PO TABS
1000.0000 mg | ORAL_TABLET | Freq: Once | ORAL | Status: AC
Start: 2024-07-09 — End: 2024-07-09
  Administered 2024-07-09: 1000 mg via ORAL
  Filled 2024-07-09: qty 2

## 2024-07-09 MED ORDER — HYDROMORPHONE HCL 1 MG/ML IJ SOLN
INTRAMUSCULAR | Status: AC
  Filled 2024-07-09: qty 1

## 2024-07-09 MED ORDER — KETOROLAC TROMETHAMINE 15 MG/ML IJ SOLN
7.5000 mg | Freq: Four times a day (QID) | INTRAMUSCULAR | Status: AC
Start: 2024-07-10 — End: 2024-07-10
  Administered 2024-07-09 – 2024-07-10 (×4): 7.5 mg via INTRAVENOUS
  Filled 2024-07-09 (×4): qty 1

## 2024-07-09 MED ORDER — ACETAMINOPHEN 10 MG/ML IV SOLN: 1000.0000 mg | Freq: Once | INTRAVENOUS | Status: DC | PRN

## 2024-07-09 MED ORDER — CELECOXIB 100 MG PO CAPS
100.0000 mg | ORAL_CAPSULE | Freq: Two times a day (BID) | ORAL | 0 refills | Status: AC
  Filled 2024-07-09: qty 28, 14d supply, fill #0

## 2024-07-09 MED ORDER — POLYETHYLENE GLYCOL 3350 17 GM/SCOOP PO POWD
17.0000 g | Freq: Every day | ORAL | 0 refills | Status: AC
  Filled 2024-07-09: qty 238, 14d supply, fill #0

## 2024-07-09 MED ORDER — PHENYLEPHRINE HCL-NACL 20-0.9 MG/250ML-% IV SOLN
INTRAVENOUS | Status: DC | PRN
  Administered 2024-07-09: 40 ug/min via INTRAVENOUS

## 2024-07-09 MED ORDER — ONDANSETRON HCL 4 MG PO TABS: 4.0000 mg | ORAL_TABLET | Freq: Four times a day (QID) | ORAL | Status: DC | PRN

## 2024-07-09 MED ORDER — SENNA 8.6 MG PO TABS
1.0000 | ORAL_TABLET | Freq: Two times a day (BID) | ORAL | Status: DC
  Administered 2024-07-09 – 2024-07-14 (×10): 8.6 mg via ORAL
  Filled 2024-07-09 (×11): qty 1

## 2024-07-09 MED ORDER — PHENOL 1.4 % MT LIQD: 1.0000 | OROMUCOSAL | Status: DC | PRN

## 2024-07-09 MED ORDER — KETOROLAC TROMETHAMINE 30 MG/ML IJ SOLN
INTRAMUSCULAR | Status: AC
  Filled 2024-07-09: qty 1

## 2024-07-09 MED ORDER — WATER FOR IRRIGATION, STERILE IR SOLN
Status: DC | PRN
  Administered 2024-07-09: 2000 mL

## 2024-07-09 MED ORDER — LACTATED RINGERS IV SOLN: INTRAVENOUS | Status: DC

## 2024-07-09 MED ORDER — BUPIVACAINE LIPOSOME 1.3 % IJ SUSP: 10.0000 mL | Freq: Once | INTRAMUSCULAR | Status: DC

## 2024-07-09 MED ORDER — BACLOFEN 10 MG PO TABS
10.0000 mg | ORAL_TABLET | Freq: Two times a day (BID) | ORAL | Status: DC
  Administered 2024-07-09 – 2024-07-14 (×10): 10 mg via ORAL
  Filled 2024-07-09 (×10): qty 1

## 2024-07-09 MED ORDER — LISDEXAMFETAMINE DIMESYLATE 30 MG PO CAPS
50.0000 mg | ORAL_CAPSULE | Freq: Every morning | ORAL | Status: DC
Start: 2024-07-10 — End: 2024-07-09

## 2024-07-09 MED ORDER — SODIUM CHLORIDE 0.9 % IR SOLN
Status: DC | PRN
  Administered 2024-07-09: 3000 mL

## 2024-07-09 MED ORDER — OXYCODONE HCL 5 MG PO TABS
10.0000 mg | ORAL_TABLET | ORAL | Status: DC | PRN
  Administered 2024-07-10 (×3): 10 mg via ORAL
  Administered 2024-07-11: 15 mg via ORAL
  Administered 2024-07-11 (×2): 10 mg via ORAL
  Administered 2024-07-11 – 2024-07-12 (×3): 15 mg via ORAL
  Administered 2024-07-13: 10 mg via ORAL
  Administered 2024-07-13: 15 mg via ORAL
  Administered 2024-07-13: 10 mg via ORAL
  Administered 2024-07-13: 15 mg via ORAL
  Administered 2024-07-14: 5 mg via ORAL
  Filled 2024-07-09: qty 3
  Filled 2024-07-09: qty 2
  Filled 2024-07-09 (×2): qty 3
  Filled 2024-07-09 (×8): qty 2
  Filled 2024-07-09: qty 3
  Filled 2024-07-09: qty 2
  Filled 2024-07-09: qty 3

## 2024-07-09 MED ORDER — HYDROMORPHONE HCL 1 MG/ML IJ SOLN
0.5000 mg | INTRAMUSCULAR | Status: DC | PRN
  Administered 2024-07-09 – 2024-07-11 (×2): 1 mg via INTRAVENOUS
  Filled 2024-07-09 (×2): qty 1

## 2024-07-09 MED ORDER — BUPIVACAINE HCL (PF) 0.5 % IJ SOLN
INTRAMUSCULAR | Status: DC | PRN
  Administered 2024-07-09: 13 mg via INTRATHECAL

## 2024-07-09 MED ORDER — FUROSEMIDE 20 MG PO TABS: 20.0000 mg | ORAL_TABLET | Freq: Every day | ORAL | Status: DC | PRN

## 2024-07-09 MED ORDER — KETOROLAC TROMETHAMINE 30 MG/ML IJ SOLN
30.0000 mg | Freq: Once | INTRAMUSCULAR | Status: AC | PRN
  Administered 2024-07-09: 30 mg via INTRAVENOUS

## 2024-07-09 MED ORDER — DOCUSATE SODIUM 100 MG PO CAPS: 200.0000 mg | ORAL_CAPSULE | Freq: Every day | ORAL | Status: DC | PRN

## 2024-07-09 MED ORDER — ACETAMINOPHEN 500 MG PO TABS: 1000.0000 mg | ORAL_TABLET | Freq: Three times a day (TID) | ORAL | Status: AC | PRN

## 2024-07-09 MED ORDER — PROPOFOL 1000 MG/100ML IV EMUL
INTRAVENOUS | Status: AC
Start: 2024-07-09 — End: 2024-07-09
  Filled 2024-07-09: qty 100

## 2024-07-09 MED ORDER — PHENYLEPHRINE 80 MCG/ML (10ML) SYRINGE FOR IV PUSH (FOR BLOOD PRESSURE SUPPORT)
PREFILLED_SYRINGE | INTRAVENOUS | Status: DC | PRN
  Administered 2024-07-09: 160 ug via INTRAVENOUS

## 2024-07-09 MED ORDER — METHOCARBAMOL 500 MG PO TABS
500.0000 mg | ORAL_TABLET | Freq: Four times a day (QID) | ORAL | Status: DC | PRN
Start: 2024-07-09 — End: 2024-07-14
  Administered 2024-07-09 – 2024-07-10 (×3): 500 mg via ORAL
  Filled 2024-07-09 (×3): qty 1

## 2024-07-09 MED ORDER — ISOPROPYL ALCOHOL 70 % SOLN
Status: DC | PRN
  Administered 2024-07-09: 1 via TOPICAL

## 2024-07-09 MED ORDER — BUPROPION HCL ER (XL) 150 MG PO TB24
150.0000 mg | ORAL_TABLET | Freq: Every day | ORAL | Status: DC
  Administered 2024-07-10 – 2024-07-14 (×5): 150 mg via ORAL
  Filled 2024-07-09 (×5): qty 1

## 2024-07-09 MED ORDER — APIXABAN 2.5 MG PO TABS
2.5000 mg | ORAL_TABLET | Freq: Two times a day (BID) | ORAL | 0 refills | Status: AC
  Filled 2024-07-09: qty 60, 30d supply, fill #0

## 2024-07-09 MED ORDER — LOSARTAN POTASSIUM 50 MG PO TABS
100.0000 mg | ORAL_TABLET | Freq: Every day | ORAL | Status: DC
  Administered 2024-07-10 – 2024-07-11 (×2): 100 mg via ORAL
  Filled 2024-07-09 (×4): qty 2

## 2024-07-09 MED ORDER — MIDAZOLAM HCL 5 MG/5ML IJ SOLN
INTRAMUSCULAR | Status: DC | PRN
  Administered 2024-07-09: 2 mg via INTRAVENOUS

## 2024-07-09 MED ORDER — CEFADROXIL 500 MG PO CAPS
500.0000 mg | ORAL_CAPSULE | Freq: Two times a day (BID) | ORAL | 0 refills | Status: AC
  Filled 2024-07-09: qty 14, 7d supply, fill #0

## 2024-07-09 MED ORDER — OMEPRAZOLE 40 MG PO CPDR
40.0000 mg | DELAYED_RELEASE_CAPSULE | Freq: Every day | ORAL | 0 refills | Status: AC
  Filled 2024-07-09: qty 21, 21d supply, fill #0

## 2024-07-09 MED ORDER — DEXAMETHASONE SOD PHOSPHATE PF 10 MG/ML IJ SOLN
8.0000 mg | Freq: Once | INTRAMUSCULAR | Status: AC
Start: 2024-07-09 — End: 2024-07-09
  Administered 2024-07-09: 8 mg via INTRAVENOUS

## 2024-07-09 MED ORDER — FENTANYL CITRATE (PF) 100 MCG/2ML IJ SOLN
INTRAMUSCULAR | Status: DC | PRN
  Administered 2024-07-09 (×2): 50 ug via INTRAVENOUS

## 2024-07-09 MED ORDER — FENTANYL CITRATE (PF) 100 MCG/2ML IJ SOLN
INTRAMUSCULAR | Status: AC
  Filled 2024-07-09: qty 2

## 2024-07-09 MED ORDER — PROPOFOL 500 MG/50ML IV EMUL
INTRAVENOUS | Status: DC | PRN
  Administered 2024-07-09: 120 ug/kg/min via INTRAVENOUS

## 2024-07-09 MED ORDER — ONDANSETRON HCL 4 MG PO TABS
4.0000 mg | ORAL_TABLET | Freq: Three times a day (TID) | ORAL | 0 refills | Status: AC | PRN
  Filled 2024-07-09: qty 14, 5d supply, fill #0

## 2024-07-09 MED ORDER — APIXABAN 2.5 MG PO TABS
2.5000 mg | ORAL_TABLET | Freq: Two times a day (BID) | ORAL | Status: DC
  Administered 2024-07-10 – 2024-07-14 (×9): 2.5 mg via ORAL
  Filled 2024-07-09 (×9): qty 1

## 2024-07-09 MED ORDER — PHENYLEPHRINE 80 MCG/ML (10ML) SYRINGE FOR IV PUSH (FOR BLOOD PRESSURE SUPPORT)
PREFILLED_SYRINGE | INTRAVENOUS | Status: AC
  Filled 2024-07-09: qty 10

## 2024-07-09 MED ORDER — OXYCODONE HCL 5 MG PO TABS
5.0000 mg | ORAL_TABLET | ORAL | 0 refills | Status: DC | PRN
  Filled 2024-07-09: qty 30, 5d supply, fill #0

## 2024-07-09 MED ORDER — NALOXEGOL OXALATE 25 MG PO TABS
25.0000 mg | ORAL_TABLET | Freq: Every day | ORAL | Status: DC
  Administered 2024-07-10 – 2024-07-14 (×5): 25 mg via ORAL
  Filled 2024-07-09 (×5): qty 1

## 2024-07-09 MED ORDER — SCOPOLAMINE 1 MG/3DAYS TD PT72
MEDICATED_PATCH | TRANSDERMAL | Status: AC
  Filled 2024-07-09: qty 1

## 2024-07-09 MED ORDER — CEFADROXIL 500 MG PO CAPS
500.0000 mg | ORAL_CAPSULE | Freq: Two times a day (BID) | ORAL | Status: DC
Start: 2024-07-12 — End: 2024-07-14
  Administered 2024-07-12 – 2024-07-14 (×4): 500 mg via ORAL
  Filled 2024-07-09 (×4): qty 1

## 2024-07-09 MED ORDER — METHOCARBAMOL 1000 MG/10ML IJ SOLN
500.0000 mg | Freq: Four times a day (QID) | INTRAMUSCULAR | Status: DC | PRN
  Administered 2024-07-09 – 2024-07-11 (×2): 500 mg via INTRAVENOUS
  Filled 2024-07-09: qty 10

## 2024-07-09 MED ORDER — DIPHENHYDRAMINE HCL 12.5 MG/5ML PO ELIX: 12.5000 mg | ORAL_SOLUTION | ORAL | Status: DC | PRN

## 2024-07-09 SURGICAL SUPPLY — 73 items
BAG COUNTER SPONGE SURGICOUNT (BAG) IMPLANT
BAG ZIPLOCK 12X15 (MISCELLANEOUS) ×1 IMPLANT
BIT DRILL TRIDENT 4X40 SU (BIT) IMPLANT
BLADE SAW SAG 25X90X1.19 (BLADE) IMPLANT
BLADE SAW SGTL 81X20 HD (BLADE) IMPLANT
BUR OVAL CARBIDE 4.0 (BURR) IMPLANT
CHLORAPREP W/TINT 26 (MISCELLANEOUS) ×2 IMPLANT
CNTNR URN SCR LID CUP LEK RST (MISCELLANEOUS) ×4 IMPLANT
COVER SURGICAL LIGHT HANDLE (MISCELLANEOUS) ×1 IMPLANT
DERMABOND ADVANCED .7 DNX12 (GAUZE/BANDAGES/DRESSINGS) IMPLANT
DRAPE C-ARM 42X120 X-RAY (DRAPES) IMPLANT
DRAPE C-ARMOR (DRAPES) IMPLANT
DRAPE HIP W/POCKET STRL (MISCELLANEOUS) ×1 IMPLANT
DRAPE INCISE IOBAN 85X60 (DRAPES) ×1 IMPLANT
DRAPE POUCH INSTRU U-SHP 10X18 (DRAPES) ×1 IMPLANT
DRAPE SHEET LG 3/4 BI-LAMINATE (DRAPES) ×3 IMPLANT
DRAPE U-SHAPE 47X51 STRL (DRAPES) ×2 IMPLANT
DRESSING PREVENA PLUS CUSTOM (GAUZE/BANDAGES/DRESSINGS) IMPLANT
DRSG AQUACEL AG ADV 3.5X10 (GAUZE/BANDAGES/DRESSINGS) IMPLANT
ELECT BLADE TIP CTD 4 INCH (ELECTRODE) ×1 IMPLANT
ELECT PT RETURN MONO 15F ADT (MISCELLANEOUS) ×1 IMPLANT
ELECT REM PT RETURN 15FT ADLT (MISCELLANEOUS) ×1 IMPLANT
FACESHIELD WRAPAROUND OR TEAM (MASK) ×1 IMPLANT
GAUZE SPONGE 4X4 12PLY STRL (GAUZE/BANDAGES/DRESSINGS) ×1 IMPLANT
GAUZE SPONGE 4X4 12PLY STRL LF (GAUZE/BANDAGES/DRESSINGS) ×1 IMPLANT
GLOVE BIO SURGEON STRL SZ 6.5 (GLOVE) ×2 IMPLANT
GLOVE BIOGEL PI IND STRL 6.5 (GLOVE) ×1 IMPLANT
GLOVE BIOGEL PI IND STRL 8 (GLOVE) ×1 IMPLANT
GLOVE SURG ORTHO 8.0 STRL STRW (GLOVE) ×2 IMPLANT
GOWN STRL REUS W/ TWL XL LVL3 (GOWN DISPOSABLE) ×2 IMPLANT
HEAD CERAMIC V40 BIOLOX DEL 28 (Orthopedic Implant) IMPLANT
HOLDER FOLEY CATH W/STRAP (MISCELLANEOUS) ×1 IMPLANT
HOOD PEEL AWAY T7 (MISCELLANEOUS) ×3 IMPLANT
INSERT 0 DEG POLY 36 F (Miscellaneous) IMPLANT
INSERT ADM X3 REST 28/52 (Insert) IMPLANT
KIT BASIN OR (CUSTOM PROCEDURE TRAY) ×1 IMPLANT
KIT DRSG PREVENA PLUS 7DAY 125 (MISCELLANEOUS) IMPLANT
KIT TURNOVER KIT A (KITS) ×1 IMPLANT
LINER MDM 46MM (Orthopedic Implant) IMPLANT
MANIFOLD NEPTUNE II (INSTRUMENTS) ×1 IMPLANT
MARKER SKIN DUAL TIP RULER LAB (MISCELLANEOUS) ×1 IMPLANT
NDL SAFETY ECLIPSE 18X1.5 (NEEDLE) IMPLANT
NS IRRIG 1000ML POUR BTL (IV SOLUTION) ×1 IMPLANT
PACK TOTAL JOINT (CUSTOM PROCEDURE TRAY) ×1 IMPLANT
PAD ARMBOARD POSITIONER FOAM (MISCELLANEOUS) ×1 IMPLANT
PENCIL SMOKE EVACUATOR (MISCELLANEOUS) ×1 IMPLANT
PROTECTOR NERVE ULNAR (MISCELLANEOUS) ×1 IMPLANT
RETRIEVER SUT HEWSON (MISCELLANEOUS) ×1 IMPLANT
SCREW HEX LP 6.5X25 (Screw) IMPLANT
SCREW HEX LP 6.5X30 (Screw) IMPLANT
SEALER BIPOLAR AQUA 6.0 (INSTRUMENTS) IMPLANT
SET HNDPC FAN SPRY TIP SCT (DISPOSABLE) ×1 IMPLANT
SHELL MULTIHOLE ACETAB F 56 (Miscellaneous) IMPLANT
SOLUTION IRRIG SURGIPHOR (IV SOLUTION) IMPLANT
SOLUTION PRONTOSAN WOUND 350ML (IRRIGATION / IRRIGATOR) IMPLANT
SPIKE FLUID TRANSFER (MISCELLANEOUS) ×3 IMPLANT
STEM FEM MOD RESTORE 155X19 (Stem) IMPLANT
SUCTION TUBE FRAZIER 12FR DISP (SUCTIONS) ×1 IMPLANT
SUT ETHIBOND #5 BRAIDED 30INL (SUTURE) ×1 IMPLANT
SUT ETHILON 3 0 PS 1 (SUTURE) IMPLANT
SUT MNCRL AB 3-0 PS2 18 (SUTURE) IMPLANT
SUT STRATAFIX 14 PDO 48 VLT (SUTURE) ×1 IMPLANT
SUT VIC AB 2-0 CT2 27 (SUTURE) ×2 IMPLANT
SUTURE STRATFX 0 PDS 27 VIOLET (SUTURE) ×1 IMPLANT
SYR 30ML LL (SYRINGE) IMPLANT
SYR 50ML LL SCALE MARK (SYRINGE) IMPLANT
SYSTEM REST MOD HIP SZ23+0 V40 (Hips) IMPLANT
TOWEL GREEN STERILE FF (TOWEL DISPOSABLE) ×1 IMPLANT
TOWEL OR 17X26 10 PK STRL BLUE (TOWEL DISPOSABLE) ×1 IMPLANT
TRAY FOLEY MTR SLVR 16FR STAT (SET/KITS/TRAYS/PACK) ×1 IMPLANT
TUBE SUCTION HIGH CAP CLEAR NV (SUCTIONS) ×1 IMPLANT
UNDERPAD 30X36 HEAVY ABSORB (UNDERPADS AND DIAPERS) ×1 IMPLANT
WATER STERILE IRR 1000ML POUR (IV SOLUTION) ×2 IMPLANT

## 2024-07-09 NOTE — Op Note (Signed)
 07/09/2024  4:42 PM  PATIENT:  Ashley [name]   MRN: [id number]  PRE-OPERATIVE DIAGNOSIS: Right hip prosthetic joint infection  POST-OPERATIVE DIAGNOSIS:  same  PROCEDURE:  Revision right total hip arthroplasty both components, replant from antibiotic spacer  PREOPERATIVE INDICATIONS:    Ashley [name] is an 48 y.o. female who has a diagnosis of chronic right hip prosthetic joint infection.  She underwent right hip arthroplasty explant with antibiotic spacer placement in July of this year.  She had culture-negative infection treated with 6 weeks of IV antibiotics.  We attempted ultrasound-guided aspiration of the right hip but no appreciable fluid was aspirated.  Inflammatory markers were checked and found to be normal which was reassuring.  Elected to proceed with replant to total hip arthroplasty.     The risks benefits and alternatives were discussed with the patient including but not limited to the risks of nonoperative treatment, versus surgical intervention including infection, bleeding, nerve injury, periprosthetic fracture, the need for revision surgery, dislocation, leg length discrepancy, blood clots, cardiopulmonary complications, morbidity, mortality, among others, and they were willing to proceed.     OPERATIVE REPORT     SURGEON:  Toribio Higashi, MD    ASSISTANT: Bernarda Mclean, PA-C, (Present throughout the entire procedure,  necessary for completion of procedure in a timely manner, assisting with retraction, instrumentation, and closure)     ANESTHESIA: Spinal  ESTIMATED BLOOD LOSS: 500cc    COMPLICATIONS:  None.      COMPONENTS:   Stryker 56 mm acetabular shell, 6.5 hex screws x 2, MDM alpha code F liner, 155 x 19 mm distal diaphyseal engaging stem, 23 mm +0 mm proximal body, 28+0 ceramic head ball with 28/52 ADM/MDM dual mobility head ball size [id number] Implant Name Type Inv. Item Serial No. Manufacturer Lot No. LRB No. Used Action  SHELL MULTIHOLE ACETAB F 56  - [id number] Miscellaneous SHELL MULTIHOLE ACETAB F 56  [employer] [id number] Right 1 Implanted  SCREW HEX LP 6.5X25 - [id number] Screw SCREW HEX LP 6.5X25  [employer] MSEA Right 1 Implanted  SCREW HEX LP 6.5X30 - [id number] Screw SCREW HEX LP 6.5X30  [employer] MWPE Right 1 Implanted  STEM FEM MOD RESTORE 155X19 - [id number] Stem STEM FEM MOD RESTORE 155X19  [employer] [id number] Right 1 Implanted  LINER MDM - [id number] Orthopedic Implant LINER MDM  [employer] [id number] Right 1 Implanted  SYSTEM REST MOD HIP SZ23+0 V40 - [id number] Hips SYSTEM REST MOD HIP SZ23+0 V40  [employer] [id number] Right 1 Implanted  INSERT ADM X3 REST 28/52 - [id number] Insert INSERT ADM X3 REST 28/52  [employer] [id number] Right 1 Implanted  HEAD CERAMIC V40 BIOLOX DEL 28 - [id number] Orthopedic Implant HEAD CERAMIC V40 BIOLOX DEL 28  [employer] [id number] Right 1 Implanted    The aquamantis was utilized for this case to help facilitate better hemostasis as patient was felt to be at increased risk of bleeding because of complex case requiring increased OR time and/or exposure.        PROCEDURE IN DETAIL:   The patient was met in the holding area and  identified.  The appropriate hip was identified and marked at the operative site.  The patient was then transported to the OR  and  placed under anesthesia.  At that point, the patient was  placed in the lateral decubitus position with the operative side up and  secured to the operating room table  and all bony prominences padded. A  subaxillary role was also placed.    The operative lower extremity was prepped from the iliac crest to the distal leg.  Sterile draping was performed.  Preoperative antibiotics, 2 gm of ancef ,1 gm of Tranexamic Acid , and 8 mg of Decadron  administered. Time out was performed prior to incision.      A routine posterolateral approach was utilized via sharp dissection   carried down to the subcutaneous tissue.  Gross bleeders were Bovie coagulated.  The iliotibial band was identified and incised along the length of the skin incision through the glute max fascia.  Charnley retractor was placed with care to protect the sciatic nerve posteriorly.  With the hip internally rotated a capsulotomy was then performed off the femoral insertion and also tagged with a #5 Ethibond.  No significant fluid was appreciated.  Tissues appeared benign.  We sent 3 synovial tissue specimens for aerobic and anaerobic culture.  We irrigated the hip with Irrisept irrigation as well as diluted Betadine  irrigation.  We carefully exposed and dislocated the total hip arthroplasty.  Had excellent stability.  Using an osteotome we carefully chipped away at some of the old cement.  Once adequately complete.  We used a bone tamp to remove the femoral prosthesis.  Next we turned our attention to the acetabular component.  We used a bone tamp again to remove some of the surrounding cement.  We were able to then easily remove the acetabular component without issue.  Cleared any debris and soft tissue from the acetabulum..  I then started reaming with a 50 mm reamer, first medializing to the floor of the cotyloid fossa, and then in the position of the cup aiming towards the greater sciatic notch, matching the version of the transverse acetabular ligament and tucked under the anterior wall. I reamed up to 56 mm reamer with good bony bed preparation and a 56 mm cup was chosen.  The real cup was then impacted into place.  Appropriate version and inclination was confirmed clinically matching their bony anatomy, and also with the use of the jig.  I placed 2 screws in the posterior superior quadrant to augment fixation.  I was pleased with the fixation of the acetabular component.  A neutral liner was placed and impacted. It was confirmed to be appropriately seated and the acetabular retractors were removed.    I  then prepared the proximal femur using the canal finder and sequentially reamed up to 18 mm.  Fluoroscopic images confirmed appropriate position on AP and lateral of the femoral reamer.  Ultimately reamed up to 19 mm which had a appropriate cortical fit.  The real 155 x 19 mm diaphyseal engaging stem was selected and impacted into the canal.  We then prepared the proximal body for a 23+0 proximal body based on preoperative templating.  Proximal body was position about 30 degrees of anteversion.  A trial head was utilized, and I reduced the hip and it was found to have excellent stability.  There was no impingement with full extension and 90 degrees external rotation.  The hip was stable at the position of sleep and with 90 degrees flexion and 45 degrees of internal rotation.  Leg lengths were also clinically assessed in the lateral position and felt to be equal. Intra-Op fluoro was obtained and confirmed appropriate component positions.  Good fill of the femur.  And restoration of leg length and offset. No evidence or concern for fracture.  Given early anterior impingement elected to convert to dual mobility  for additional stability.  That side was then exposed in the polyliner was extracted.  Appropriate appropriate active mobility liner was then impacted into place.  We then packed the real femoral prosthesis and 30 degrees of anteversion. I again trialed and selected a +61mm ball. The hip was then reduced and taken through a range of motion. There was no impingement with full extension and 90 degrees external rotation.  The hip was stable at the position of sleep and with 90 degrees flexion and 60degrees of internal rotation. Leg lengths were  again assessed and felt to be restored.  We then opened, and I impacted the real head ball into place.  The posterior capsule was then closed with #5 Ethibond.  The piriformis was repaired through the base of the abductor tendon using a [place] suture passer.  I  then irrigated the hip copiously with dilute Betadine  and with normal saline pulse lavage. Periarticular injection was then performed with Exparel .   We repaired the fascia #1 barbed suture, followed by 0 barbed suture for the subcutaneous fat.  Skin was closed with 2-0 Vicryl and 3-0 Monocryl.  Dermabond and Aquacel dressing were applied. The patient was then awakened and returned to PACU in stable and satisfactory condition.  Leg lengths in the supine position were assessed and felt to be clinically equal. There were no complications.  Post op recs: WB: 50% partial weightbearing right LE, posterior hip precautions x 6 weeks  Abx: ancef  postop, follow-up IntraOp cultures.  Discharge on extended p.o. antibiotics. Imaging: PACU pelvis Xray Dressing: Prevena wound VAC to stay on for 1 week DVT prophylaxis: Eliquis  2.5 mg twice daily starting POD1 Follow up: 2 weeks after surgery for a wound check with Dr. Edna at [facility].  Address: [address], [place], [place] [address]  Office Phone: [phone]   Toribio Edna, MD Orthopedic Surgeon

## 2024-07-09 NOTE — Interval H&P Note (Signed)
 The patient has been re-examined, and the chart reviewed, and there have been no interval changes to the documented history and physical.    Plan for THA replant revision after right hip explant and antibiotic spacer placement  The operative side was examined and the patient was confirmed to have sensation to DPN, SPN, TN intact, Motor EHL, ext, flex 5/5, and DP 2+, PT 2+, No significant edema.    The risks, benefits, and alternatives have been discussed at length with patient, and the patient is willing to proceed.  Right hip marked. Consent has been signed.

## 2024-07-09 NOTE — Anesthesia Procedure Notes (Addendum)
 Spinal  Patient location during procedure: OB Start time: 07/09/2024 1:48 PM End time: 07/09/2024 1:53 PM Reason for block: surgical anesthesia Staffing Performed: anesthesiologist  Anesthesiologist: Jefm Garnette LABOR, MD Performed by: Jefm Garnette LABOR, MD Authorized by: Jefm Garnette LABOR, MD   Preanesthetic Checklist Completed: patient identified, IV checked, risks and benefits discussed, surgical consent, monitors and equipment checked, pre-op evaluation and timeout performed Spinal Block Patient position: sitting Prep: DuraPrep and site prepped and draped Patient monitoring: heart rate, cardiac monitor, continuous pulse ox and blood pressure Approach: midline Location: L2-3 Injection technique: single-shot Needle Needle type: Pencan  Needle gauge: 24 G Needle length: 10 cm Needle insertion depth: 7 cm Assessment Sensory level: T4 Events: CSF return Additional Notes 1  Attempt (s). Pt tolerated procedure well.

## 2024-07-09 NOTE — Transfer of Care (Signed)
 Immediate Anesthesia Transfer of Care Note  Patient: Ashley [name]  Procedure(s) Performed: REVISION, ARTHROPLASTY, HIP (Right: Hip)  Patient Location: PACU  Anesthesia Type:MAC and Spinal  Level of Consciousness: awake and patient cooperative  Airway & Oxygen Therapy: Patient Spontanous Breathing and Patient connected to face mask oxygen  Post-op Assessment: Report given to RN and Post -op Vital signs reviewed and stable  Post vital signs: Reviewed and stable  Last Vitals:  Vitals Value Taken Time  BP 121/105 07/09/24 17:45  Temp    Pulse 79 07/09/24 17:46  Resp 19 07/09/24 17:46  SpO2 100 % 07/09/24 17:46  Vitals shown include unfiled device data.  Last Pain:  Vitals:   07/09/24 1032  TempSrc: Oral  PainSc: 0-No pain         Complications: No notable events documented.

## 2024-07-09 NOTE — Discharge Instructions (Addendum)
 INSTRUCTIONS AFTER JOINT REPLACEMENT   Remove items at home which could result in a fall. This includes throw rugs or furniture in walking pathways ICE to the affected joint every three hours while awake for 30 minutes at a time, for at least the first 3-5 days, and then as needed for pain and swelling.  Continue to use ice for pain and swelling. You may notice swelling that will progress down to the foot and ankle.  This is normal after surgery.  Elevate your leg when you are not up walking on it.   Continue to use the breathing machine you got in the hospital (incentive spirometer) which will help keep your temperature down.  It is common for your temperature to cycle up and down following surgery, especially at night when you are not up moving around and exerting yourself.  The breathing machine keeps your lungs expanded and your temperature down.  DIET:  As you were doing prior to hospitalization, we recommend a well-balanced diet.  DRESSING / WOUND CARE / SHOWERING:  Keep the surgical dressing until follow up.  The dressing is water proof, so you can shower without any extra covering.  IF THE DRESSING FALLS OFF or the wound gets wet inside, change the dressing with sterile gauze.  Please use good hand washing techniques before changing the dressing.  Do not use any lotions or creams on the incision until instructed by your surgeon.    ACTIVITY  Increase activity slowly as tolerated, but follow the weight bearing instructions below.   No driving for 6 weeks or until further direction given by your physician.  You cannot drive while taking narcotics.  No lifting or carrying greater than 10 lbs. until further directed by your surgeon. Avoid periods of inactivity such as sitting longer than an hour when not asleep. This helps prevent blood clots.  You may return to work once you are authorized by your doctor.   WEIGHT BEARING: Weight bearing as tolerated with assist device (walker, cane, etc) as  directed, use it as long as suggested by your surgeon or therapist, typically at least 4-6 weeks.  EXERCISES  Results after joint replacement surgery are often greatly improved when you follow the exercise, range of motion and muscle strengthening exercises prescribed by your doctor. Safety measures are also important to protect the joint from further injury. Any time any of these exercises cause you to have increased pain or swelling, decrease what you are doing until you are comfortable again and then slowly increase them. If you have problems or questions, call your caregiver or physical therapist for advice.   Rehabilitation is important following a joint replacement. After just a few days of immobilization, the muscles of the leg can become weakened and shrink (atrophy).  These exercises are designed to build up the tone and strength of the thigh and leg muscles and to improve motion. Often times heat used for twenty to thirty minutes before working out will loosen up your tissues and help with improving the range of motion but do not use heat for the first two weeks following surgery (sometimes heat can increase post-operative swelling).   These exercises can be done on a training (exercise) mat, on the floor, on a table or on a bed. Use whatever works the best and is most comfortable for you.    Use music or television while you are exercising so that the exercises are a pleasant break in your day. This will make your life  better with the exercises acting as a break in your routine that you can look forward to.   Perform all exercises about fifteen times, three times per day or as directed.  You should exercise both the operative leg and the other leg as well.  Exercises include:   Quad Sets - Tighten up the muscle on the front of the thigh (Quad) and hold for 5-10 seconds.   Straight Leg Raises - With your knee straight (if you were given a brace, keep it on), lift the leg to 60 degrees, hold  for 3 seconds, and slowly lower the leg.  Perform this exercise against resistance later as your leg gets stronger.  Leg Slides: Lying on your back, slowly slide your foot toward your buttocks, bending your knee up off the floor (only go as far as is comfortable). Then slowly slide your foot back down until your leg is flat on the floor again.  Angel Wings: Lying on your back spread your legs to the side as far apart as you can without causing discomfort.  Hamstring Strength:  Lying on your back, push your heel against the floor with your leg straight by tightening up the muscles of your buttocks.  Repeat, but this time bend your knee to a comfortable angle, and push your heel against the floor.  You may put a pillow under the heel to make it more comfortable if necessary.   A rehabilitation program following joint replacement surgery can speed recovery and prevent re-injury in the future due to weakened muscles. Contact your doctor or a physical therapist for more information on knee rehabilitation.   CONSTIPATION:  Constipation is defined medically as fewer than three stools per week and severe constipation as less than one stool per week.  Even if you have a regular bowel pattern at home, your normal regimen is likely to be disrupted due to multiple reasons following surgery.  Combination of anesthesia, postoperative narcotics, change in appetite and fluid intake all can affect your bowels.   YOU MUST use at least one of the following options; they are listed in order of increasing strength to get the job done.  They are all available over the counter, and you may need to use some, POSSIBLY even all of these options:    Drink plenty of fluids (prune juice may be helpful) and high fiber foods Colace 100 mg by mouth twice a day  Senokot for constipation as directed and as needed Dulcolax (bisacodyl ), take with full glass of water  Miralax (polyethylene glycol) once or twice a day as needed.  If you  have tried all these things and are unable to have a bowel movement in the first 3-4 days after surgery call either your surgeon or your primary doctor.    If you experience loose stools or diarrhea, hold the medications until you stool forms back up.  If your symptoms do not get better within 1 week or if they get worse, check with your doctor.  If you experience "the worst abdominal pain ever" or develop nausea or vomiting, please contact the office immediately for further recommendations for treatment.  ITCHING:  If you experience itching with your medications, try taking only a single pain pill, or even half a pain pill at a time.  You can also use Benadryl  over the counter for itching or also to help with sleep.   TED HOSE STOCKINGS:  Use stockings on both legs until for at least 2 weeks or  as directed by physician office. They may be removed at night for sleeping.  MEDICATIONS:  See your medication summary on the "After Visit Summary" that nursing will review with you.  You may have some home medications which will be placed on hold until you complete the course of blood thinner medication.  It is important for you to complete the blood thinner medication as prescribed.  Blood clot prevention (DVT Prophylaxis): After surgery you are at an increased risk for a blood clot. You were prescribed a blood thinner, Eliquis, to be taken twice daily for a total of 4 weeks from surgery to help reduce your risk of getting a blood clot.  Signs of a pulmonary embolus (blood clot in the lungs) include sudden short of breath, feeling lightheaded or dizzy, chest pain with a deep breath, rapid pulse rapid breathing.  Signs of a blood clot in your arms or legs include new unexplained swelling and cramping, warm, red or darkened skin around the painful area.  Please call the office or 911 right away if these signs or symptoms develop.  PRECAUTIONS:   If you experience chest pain or shortness of breath - call 911  immediately for transfer to the hospital emergency department.   If you develop a fever greater that 101 F, purulent drainage from wound, increased redness or drainage from wound, foul odor from the wound/dressing, or calf pain - CONTACT YOUR SURGEON.                                                   FOLLOW-UP APPOINTMENTS:  If you do not already have a post-op appointment, please call the office for an appointment to be seen by your surgeon.  Guidelines for how soon to be seen are listed in your "After Visit Summary", but are typically between 2-3 weeks after surgery.  If you have a specialized bandage, you may be told to follow up 1 week after surgery.  POST-OPERATIVE OPIOID TAPER INSTRUCTIONS: It is important to wean off of your opioid medication as soon as possible. If you do not need pain medication after your surgery it is ok to stop day one. Opioids include: Codeine, Hydrocodone(Norco, Vicodin), Oxycodone (Percocet, oxycontin ) and hydromorphone  amongst others.  Long term and even short term use of opiods can cause: Increased pain response Dependence Constipation Depression Respiratory depression And more.  Withdrawal symptoms can include Flu like symptoms Nausea, vomiting And more Techniques to manage these symptoms Hydrate well Eat regular healthy meals Stay active Use relaxation techniques(deep breathing, meditating, yoga) Do Not substitute Alcohol to help with tapering If you have been on opioids for less than two weeks and do not have pain than it is ok to stop all together.  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 Maintain the same interval or time between taking each dose and first decrease the dose.  Cut the total daily intake of opioids by one tablet each day Next start to increase the time between doses. The last dose that should be eliminated is the evening dose.   MAKE SURE YOU:  Understand these  instructions.  Get help right away if you are not doing well or get worse.    Thank you for letting us  be a part of your medical care team.  It is a privilege we respect greatly.  We hope these instructions will help you stay on track for a fast and full recovery!        Information on my medicine - ELIQUIS (apixaban)  This medication education was reviewed with me or my healthcare representative as part of my discharge preparation.  The pharmacist that spoke with me during my hospital stay was:    Why was Eliquis prescribed for you? Eliquis was prescribed for you to reduce the risk of blood clots forming after orthopedic surgery.    What do You need to know about Eliquis? Take your Eliquis TWICE DAILY - one tablet in the morning and one tablet in the evening with or without food.  It would be best to take the dose about the same time each day.  If you have difficulty swallowing the tablet whole please discuss with your pharmacist how to take the medication safely.  Take Eliquis exactly as prescribed by your doctor and DO NOT stop taking Eliquis without talking to the doctor who prescribed the medication.  Stopping without other medication to take the place of Eliquis may increase your risk of developing a clot.  After discharge, you should have regular check-up appointments with your healthcare provider that is prescribing your Eliquis.  What do you do if you miss a dose? If a dose of ELIQUIS is not taken at the scheduled time, take it as soon as possible on the same day and twice-daily administration should be resumed.  The dose should not be doubled to make up for a missed dose.  Do not take more than one tablet of ELIQUIS at the same time.  Important Safety Information A possible side effect of Eliquis is bleeding. You should call your healthcare provider right away if you experience any of the following: Bleeding from an injury or your nose that does not stop. Unusual  colored urine (red or dark brown) or unusual colored stools (red or black). Unusual bruising for unknown reasons. A serious fall or if you hit your head (even if there is no bleeding).  Some medicines may interact with Eliquis and might increase your risk of bleeding or clotting while on Eliquis. To help avoid this, consult your healthcare provider or pharmacist prior to using any new prescription or non-prescription medications, including herbals, vitamins, non-steroidal anti-inflammatory drugs (NSAIDs) and supplements.  This website has more information on Eliquis (apixaban): http://www.eliquis.com/eliquis/home

## 2024-07-10 ENCOUNTER — Encounter (HOSPITAL_COMMUNITY): Payer: Self-pay | Admitting: Orthopedic Surgery

## 2024-07-10 ENCOUNTER — Other Ambulatory Visit (HOSPITAL_COMMUNITY): Payer: Self-pay

## 2024-07-10 LAB — BASIC METABOLIC PANEL WITH GFR
Anion gap: 8 (ref 5–15)
BUN: 16 mg/dL (ref 6–20)
CO2: 27 mmol/L (ref 22–32)
Calcium: 8.9 mg/dL (ref 8.9–10.3)
Chloride: 106 mmol/L (ref 98–111)
Creatinine, Ser: 0.88 mg/dL (ref 0.44–1.00)
GFR, Estimated: >60 mL/min (ref >60)
Glucose, Bld: 217 mg/dL — ABNORMAL HIGH (ref 70–99)
Potassium: 4.9 mmol/L (ref 3.5–5.1)
Sodium: 141 mmol/L (ref 135–145)

## 2024-07-10 LAB — CBC
HCT: 31.3 % — ABNORMAL LOW (ref 36.0–46.0)
Hemoglobin: 9.4 g/dL — ABNORMAL LOW (ref 12.0–15.0)
MCH: 24.7 pg — ABNORMAL LOW (ref 26.0–34.0)
MCHC: 30 g/dL (ref 30.0–36.0)
MCV: 82.4 fL (ref 80.0–100.0)
Platelets: 176 K/uL (ref 150–400)
RBC: 3.8 MIL/uL — ABNORMAL LOW (ref 3.87–5.11)
RDW: 15.4 % (ref 11.5–15.5)
WBC: 8.8 K/uL (ref 4.0–10.5)
nRBC: 0 % (ref 0.0–0.2)

## 2024-07-10 MED ORDER — ADULT MULTIVITAMIN W/MINERALS CH
1.0000 | ORAL_TABLET | Freq: Every morning | ORAL | Status: DC
  Administered 2024-07-11 – 2024-07-14 (×4): 1 via ORAL
  Filled 2024-07-10 (×4): qty 1

## 2024-07-10 MED ORDER — VITAMIN D 25 MCG (1000 UNIT) PO TABS
1000.0000 [IU] | ORAL_TABLET | Freq: Every morning | ORAL | Status: DC
  Administered 2024-07-11 – 2024-07-14 (×4): 1000 [IU] via ORAL
  Filled 2024-07-10 (×4): qty 1

## 2024-07-10 NOTE — Plan of Care (Signed)
  Problem: Clinical Measurements: Goal: Will remain free from infection Outcome: Progressing Goal: Respiratory complications will improve Outcome: Progressing Goal: Cardiovascular complication will be avoided Outcome: Progressing   Problem: Activity: Goal: Risk for activity intolerance will decrease Outcome: Progressing   Problem: Nutrition: Goal: Adequate nutrition will be maintained Outcome: Progressing   Problem: Coping: Goal: Level of anxiety will decrease Outcome: Progressing   Problem: Elimination: Goal: Will not experience complications related to bowel motility Outcome: Progressing Goal: Will not experience complications related to urinary retention Outcome: Progressing   Problem: Pain Managment: Goal: General experience of comfort will improve and/or be controlled Outcome: Progressing   Problem: Safety: Goal: Ability to remain free from injury will improve Outcome: Progressing   Problem: Skin Integrity: Goal: Risk for impaired skin integrity will decrease Outcome: Progressing   Problem: Activity: Goal: Ability to avoid complications of mobility impairment will improve Outcome: Progressing Goal: Ability to tolerate increased activity will improve Outcome: Progressing   Problem: Clinical Measurements: Goal: Postoperative complications will be avoided or minimized Outcome: Progressing   Problem: Pain Management: Goal: Pain level will decrease with appropriate interventions Outcome: Progressing

## 2024-07-10 NOTE — Evaluation (Signed)
 Physical Therapy Evaluation Patient Details Name: Ashley [name] MRN: [id number] DOB: August 10, 1976 Today's Date: 07/10/2024  History of Present Illness  Ashley [name] is a 48 y.o. 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  Pt doing well, pain controlled, amb~ 300' with RW, pain controlled. Anticipate steady progress in acute setting.    Pt will benefit from acute skilled PT to increase their independence and safety with mobility to facilitate discharge.          If plan is discharge home, recommend the following: Assist for transportation;Help with stairs or ramp for entrance   Can travel by private vehicle        Equipment Recommendations None recommended by PT  Recommendations for Other Services       Functional Status Assessment Patient has had a recent decline in their functional status and demonstrates the ability to make significant improvements in function in a reasonable and predictable amount of time.     Precautions / Restrictions Precautions Precautions: Posterior Hip;Fall Restrictions RLE Weight Bearing Per Provider Order: Partial weight bearing RLE Partial Weight Bearing Percentage or Pounds: 50%      Mobility  Bed Mobility Overal bed mobility: Needs Assistance Bed Mobility: Supine to Sit     Supine to sit: Supervision     General bed mobility comments: for lines and safety    Transfers Overall transfer level: Needs assistance Equipment used: Rolling walker (2 wheels) Transfers: Sit to/from Stand Sit to Stand: Contact guard assist, Supervision           General  transfer comment: for safety, no physical assist    Ambulation/Gait Ambulation/Gait assistance: Contact guard assist Gait Distance (Feet): 300 Feet Assistive device: Rolling walker (2 wheels) Gait Pattern/deviations: Step-to pattern       General Gait Details: cues for initial sequence, PWB  Stairs            Wheelchair Mobility     Tilt Bed    Modified Rankin (Stroke Patients Only)       Balance Overall balance assessment: No apparent balance deficits (not formally assessed)                                           Pertinent Vitals/Pain Pain Assessment Pain Assessment: Faces Faces Pain Scale: Hurts a little bit Pain Location: right hip Pain Descriptors / Indicators: Discomfort Pain Intervention(s): Limited activity within patient's tolerance, Monitored during session, Premedicated before session    Home Living Family/patient expects to be discharged to:: Private residence Living Arrangements: Children Available Help at Discharge: Family;Available 24 hours/day Type of Home: House         Home Layout: One level Home Equipment: Cane - single point;Tub bench;BSC/3in1;Adaptive equipment;Rolling Walker (2 wheels)      Prior Function Prior Level of Function : Independent/Modified Independent                     Extremity/Trunk Assessment   Upper Extremity Assessment Upper Extremity Assessment: Overall WFL for tasks assessed    Lower  Extremity Assessment Lower Extremity Assessment: RLE deficits/detail RLE Deficits / Details: AROM grossly WFL within limits of hip precautions; knee ext and flexion 5/5       Communication   Communication Communication: No apparent difficulties    Cognition Arousal: Alert Behavior During Therapy: WFL for tasks assessed/performed   PT - Cognitive impairments: No apparent impairments                         Following commands: Intact       Cueing Cueing Techniques: Verbal  cues     General Comments      Exercises     Assessment/Plan    PT Assessment Patient needs continued PT services  PT Problem List Decreased activity tolerance;Decreased mobility       PT Treatment Interventions DME instruction;Therapeutic exercise;Gait training;Functional mobility training;Therapeutic activities;Patient/family education    PT Goals (Current goals can be found in the Care Plan section)  Acute Rehab PT Goals PT Goal Formulation: With patient Time For Goal Achievement: 07/17/24 Potential to Achieve Goals: Good    Frequency 7X/week     Co-evaluation               AM-PAC PT 6 Clicks Mobility  Outcome Measure Help needed turning from your back to your side while in a flat bed without using bedrails?: A Little Help needed moving from lying on your back to sitting on the side of a flat bed without using bedrails?: A Little Help needed moving to and from a bed to a chair (including a wheelchair)?: A Little Help needed standing up from a chair using your arms (e.g., wheelchair or bedside chair)?: A Little Help needed to walk in hospital room?: A Little Help needed climbing 3-5 steps with a railing? : A Little 6 Click Score: 18    End of Session Equipment Utilized During Treatment: Gait belt Activity Tolerance: Patient tolerated treatment well Patient left: with call bell/phone within reach;in chair Nurse Communication: Mobility status PT Visit Diagnosis: Other abnormalities of gait and mobility (R26.89)    Time: 8851-8777 PT Time Calculation (min) (ACUTE ONLY): 34 min   Charges:   PT Evaluation $PT Eval Low Complexity: 1 Low PT Treatments $Gait Training: 8-22 mins PT General Charges $$ ACUTE PT VISIT: 1 Visit         Robbi Scurlock, PT  Acute Rehab Dept ([facility]) [phone]  07/10/2024   [facility] 07/10/2024, 2:07 PM

## 2024-07-10 NOTE — Progress Notes (Signed)
     Subjective:  Patient reports pain as moderate.  Doing well overall.  Answering questions appropriately.  Discussed plan for mobilization of therapy today.  Denies distal numbness and tingling.  Yesterday's total administered Morphine  Milligram Equivalents: 75   Objective:   VITALS:   Vitals:   07/09/24 1840 07/09/24 2013 07/10/24 0211 07/10/24 0549  BP: 131/81 (!) 140/93 124/69 122/70  Pulse: 75 82 80 66  Resp: 11 18 18 18   Temp: (!) 97.5 F (36.4 C) 98.3 F (36.8 C) 97.7 F (36.5 C) (!) 97.5 F (36.4 C)  TempSrc:      SpO2: 97% 100% 100% 100%  Weight:      Height:        Sensation intact distally Intact pulses distally Dorsiflexion/Plantar flexion intact Incision: dressing C/D/I Compartment soft Wound VAC holding suction no output in canister    Lab Results  Component Value Date   WBC 8.8 07/10/2024   HGB 9.4 (L) 07/10/2024   HCT 31.3 (L) 07/10/2024   MCV 82.4 07/10/2024   PLT 176 07/10/2024   BMET    Component Value Date/Time   NA 141 07/10/2024 0314   NA 139 10/21/2023 1054   K 4.9 07/10/2024 0314   CL 106 07/10/2024 0314   CO2 27 07/10/2024 0314   GLUCOSE 217 (H) 07/10/2024 0314   BUN 16 07/10/2024 0314   BUN 8 10/21/2023 1054   CREATININE 0.88 07/10/2024 0314   CALCIUM  8.9 07/10/2024 0314   CALCIUM  8.3 (L) 08/28/2010 0525   EGFR 91 10/21/2023 1054   GFRNONAA >60 07/10/2024 0314    Xray: Revision total hip arthroplasty components in good position no adverse features  Assessment/Plan: 1 Day Post-Op   Principal Problem:   Failed total hip arthroplasty   Status post revision right hip arthroplasty replant after antibiotic spacer 07/09/2024   Post op recs: WB: 50% partial weightbearing right LE, posterior hip precautions x 6 weeks  Abx: ancef  postop, follow-up IntraOp cultures.  Discharge on extended p.o. antibiotics.  IntraOp cultures no growth to date Imaging: PACU pelvis Xray Dressing: Prevena wound VAC to stay on for 1 week DVT  prophylaxis: Eliquis  2.5 mg twice daily starting POD1 Follow up: 2 weeks after surgery for a wound check with Dr. Edna at [facility].  Address: [address], [place], [place] [address]  Office Phone: [phone]     TORIBIO DELENA EDNA 07/10/2024, 6:41 AM   TORIBIO Edna, MD  Contact information:   [phone] 7am-5pm epic message Dr. Edna, or call office for patient follow up: [phone] After hours and holidays please check [email or web address] for group call information for [facility]

## 2024-07-10 NOTE — Progress Notes (Signed)
 PT TX NOTE  07/10/24 1700  PT Visit Information  Last PT Received On 07/10/24  Assistance Needed Pt progressing steadily. Pain remains well controlled, amb ~ 250' with cues for PWB and THP during functional tasks.  Continue PT during acute stay.  History of Present Illness Ashley [name] is a 48 y.o. 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/24, HTN, depression/anxiety, s/p sleeve gastrectomy, s/p right THA 2017, chronic pain syndrome  Precautions  Precautions Posterior Hip;Fall  Precaution/Restrictions Comments incisional VAC  Restrictions  RLE Weight Bearing Per Provider Order PWB  RLE Partial Weight Bearing Percentage or Pounds 50%  Pain Assessment  Pain Assessment Faces  Faces Pain Scale 2  Pain Location right hip  Pain Descriptors / Indicators Discomfort  Pain Intervention(s) Limited activity within patient's tolerance;Monitored during session;Premedicated before session;Repositioned  Cognition  Arousal Alert  Behavior During Therapy WFL for tasks assessed/performed  PT - Cognitive impairments No apparent impairments  Following Commands  Following commands Intact  Cueing  Cueing Techniques Verbal cues  Communication  Communication No apparent difficulties  Bed Mobility  Overal bed mobility Needs Assistance  Bed Mobility Supine to Sit;Sit to Supine  Supine to sit Supervision  Sit to supine Min assist  General bed mobility comments light assist to lift RLE on to bed;  cues to avoid crossing RLE  Transfers  Overall transfer level Needs assistance  Equipment used Rolling walker (2 wheels)  Transfers Sit to/from Stand  Sit to Stand Contact guard assist;Supervision  General transfer comment for safety, no physical assist  Ambulation/Gait  Ambulation/Gait assistance Supervision  Gait Distance (Feet)  250 Feet  Assistive device Rolling walker (2 wheels)  Gait Pattern/deviations Step-to pattern;Step-through pattern  General Gait Details cues for initial sequence, PWB,  to avoid incr step length on LLE, use of UEs to maintain PWB  Balance  Overall balance assessment No apparent balance deficits (not formally assessed)  PT - End of Session  Equipment Utilized During Treatment Gait belt  Activity Tolerance Patient tolerated treatment well  Patient left in bed;with call bell/phone within reach;with bed alarm set;with family/visitor present  Nurse Communication Mobility status   PT - Assessment/Plan  PT Visit Diagnosis Other abnormalities of gait and mobility (R26.89)  PT Frequency (ACUTE ONLY) 7X/week  Follow Up Recommendations Follow physician's recommendations for discharge plan and follow up therapies  Patient can return home with the following Assist for transportation;Help with stairs or ramp for entrance  PT equipment None recommended by PT  AM-PAC PT 6 Clicks Mobility Outcome Measure (Version 2)  Help needed turning from your back to your side while in a flat bed without using bedrails? 3  Help needed moving from lying on your back to sitting on the side of a flat bed without using bedrails? 3  Help needed moving to and from a bed to a chair (including a wheelchair)? 3  Help needed standing up from a chair using your arms (e.g., wheelchair or bedside chair)? 3  Help needed to walk in hospital room? 3  Help needed climbing 3-5 steps with a railing?  3  6 Click Score 18  Consider Recommendation of Discharge To: Home with [facility]  PT Goal Progression  Progress towards PT goals Progressing toward goals  Acute Rehab PT Goals  PT Goal Formulation With patient  Time For Goal Achievement  07/17/24  Potential to Achieve Goals Good  PT Time Calculation  PT Start Time (ACUTE ONLY) 1634  PT Stop Time (ACUTE ONLY) 1702  PT Time Calculation (min) (ACUTE ONLY) 28 min  PT General Charges  $$  ACUTE PT VISIT 1 Visit  PT Treatments  $Gait Training 23-37 mins

## 2024-07-11 ENCOUNTER — Other Ambulatory Visit (HOSPITAL_COMMUNITY): Payer: Self-pay

## 2024-07-11 LAB — CBC
HCT: 25 % — ABNORMAL LOW (ref 36.0–46.0)
Hemoglobin: 7.7 g/dL — ABNORMAL LOW (ref 12.0–15.0)
MCH: 25.3 pg — ABNORMAL LOW (ref 26.0–34.0)
MCHC: 30.8 g/dL (ref 30.0–36.0)
MCV: 82.2 fL (ref 80.0–100.0)
Platelets: 149 K/uL — ABNORMAL LOW (ref 150–400)
RBC: 3.04 MIL/uL — ABNORMAL LOW (ref 3.87–5.11)
RDW: 15.9 % — ABNORMAL HIGH (ref 11.5–15.5)
WBC: 10.5 K/uL (ref 4.0–10.5)
nRBC: 0 % (ref 0.0–0.2)

## 2024-07-11 NOTE — Progress Notes (Signed)
 Physical Therapy Treatment Patient Details Name: Ashley [name] MRN: [id number] DOB: Nov 16, 1975 Today's Date: 07/11/2024   History of Present Illness Ashley [name] is a 48 y.o. 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/24, HTN, depression/anxiety, s/p sleeve gastrectomy, s/p right THA 2017, chronic pain syndrome    PT Comments  Pt reports incr pain and fatigue today; amb ~ 2' with ongoing education to maintain PWB, pt with good adherence during distance, no dizziness but reports being tired. Pt returned to bed EOS, contacted RN to return for pt's pain  meds. Continue PT POC     If plan is discharge home, recommend the following: Assist for transportation;Help with stairs or ramp for entrance   Can travel by private vehicle        Equipment Recommendations  None recommended by PT    Recommendations for Other Services       Precautions / Restrictions Precautions Precautions: Posterior Hip;Fall Precaution/Restrictions Comments: incisional VAC Restrictions RLE Weight Bearing Per Provider Order: Partial weight bearing RLE Partial Weight Bearing Percentage or Pounds: 50%     Mobility  Bed Mobility Overal bed mobility: Needs Assistance Bed Mobility: Supine to Sit, Sit to Supine     Supine to sit: Supervision Sit to supine: Min assist   General bed mobility comments: light assist to lift RLE on to bed d/t incr pain    Transfers Overall transfer level: Needs assistance Equipment used: Rolling walker (2 wheels) Transfers: Sit to/from Stand Sit to Stand: Contact guard assist, Supervision           General transfer comment: for safety, no physical assist    Ambulation/Gait Ambulation/Gait assistance: Supervision Gait Distance (Feet): 90 Feet Assistive device: Rolling walker (2 wheels) Gait  Pattern/deviations: Step-to pattern       General Gait Details: ongoing education on  sequence, PWB,  to avoid incr step length on LLE, use of UEs to maintain PWB; gait distance limtied by fatigue   Stairs             Wheelchair Mobility     Tilt Bed    Modified Rankin (Stroke Patients Only)       Balance Overall balance assessment: No apparent balance deficits (not formally assessed)                                          Communication Communication Communication: No apparent difficulties  Cognition Arousal: Alert Behavior During Therapy: WFL for tasks assessed/performed   PT - Cognitive impairments: No apparent impairments                         Following commands: Intact      Cueing Cueing Techniques: Verbal cues  Exercises      General Comments        Pertinent Vitals/Pain Pain Assessment Pain Assessment: Faces Faces Pain Scale: Hurts a little bit Pain Location: right hip Pain Descriptors / Indicators: Discomfort    Home Living                          Prior Function            PT Goals (current  goals can now be found in the care plan section) Acute Rehab PT Goals PT Goal Formulation: With patient Time For Goal Achievement: 07/17/24 Potential to Achieve Goals: Good Progress towards PT goals: Progressing toward goals    Frequency    7X/week      PT Plan      Co-evaluation              AM-PAC PT 6 Clicks Mobility   Outcome Measure  Help needed turning from your back to your side while in a flat bed without using bedrails?: A Little Help needed moving from lying on your back to sitting on the side of a flat bed without using bedrails?: A Little Help needed moving to and from a bed to a chair (including a wheelchair)?: A Little Help needed standing up from a chair using your arms (e.g., wheelchair or bedside chair)?: A Little Help needed to walk in hospital room?: A Little Help  needed climbing 3-5 steps with a railing? : A Little 6 Click Score: 18    End of Session Equipment Utilized During Treatment: Gait belt Activity Tolerance: Patient tolerated treatment well Patient left: in bed;with call bell/phone within reach;with bed alarm set;with family/visitor present Nurse Communication: Mobility status PT Visit Diagnosis: Other abnormalities of gait and mobility (R26.89)     Time: 8769-8748 PT Time Calculation (min) (ACUTE ONLY): 21 min  Charges:    $Gait Training: 8-22 mins PT General Charges $$ ACUTE PT VISIT: 1 Visit                     Dwayne Begay, PT  Acute Rehab Dept ([facility]) [phone]  07/11/2024    [facility] 07/11/2024, 1:21 PM

## 2024-07-11 NOTE — Progress Notes (Signed)
     Subjective:  Patient reports pain as moderate.  Last pain medicine dose at 1-2 AM per patient, requesting another dose.  Doing well overall.  Answering questions appropriately.  Mobilized 359ft + 245ft with PT yesterday.  Denies distal numbness and tingling.  No reports of chest pain, shortness of breath, dizziness, or other concerns.  FM at bedside, confirms patient engagement is at baseline.  Yesterday's total administered Morphine  Milligram Equivalents: 45   Objective:   VITALS:   Vitals:   07/10/24 1403 07/10/24 1804 07/10/24 1959 07/11/24 0655  BP:  126/68 117/63 (!) 112/55  Pulse:  75 88 72  Resp:  15 16 15   Temp: 98.1 F (36.7 C) 98.1 F (36.7 C) 98.9 F (37.2 C) 98.7 F (37.1 C)  TempSrc: Oral Oral Oral Oral  SpO2:  100% 100% 100%  Weight:      Height:        Resting comfortably in bed in NAD Sensation intact distally Intact pulses distally Dorsiflexion/Plantar flexion intact Incision: dressing C/D/I Compartment soft Wound VAC holding suction no output in canister Wiggles toes appropriately   Lab Results  Component Value Date   WBC 10.5 07/11/2024   HGB 7.7 (L) 07/11/2024   HCT 25.0 (L) 07/11/2024   MCV 82.2 07/11/2024   PLT 149 (L) 07/11/2024   BMET    Component Value Date/Time   NA 141 07/10/2024 0314   NA 139 10/21/2023 1054   K 4.9 07/10/2024 0314   CL 106 07/10/2024 0314   CO2 27 07/10/2024 0314   GLUCOSE 217 (H) 07/10/2024 0314   BUN 16 07/10/2024 0314   BUN 8 10/21/2023 1054   CREATININE 0.88 07/10/2024 0314   CALCIUM  8.9 07/10/2024 0314   CALCIUM  8.3 (L) 08/28/2010 0525   EGFR 91 10/21/2023 1054   GFRNONAA >60 07/10/2024 0314    Xray: Revision total hip arthroplasty components in good position no adverse features  Assessment/Plan: 2 Days Post-Op   Principal Problem:   Failed total hip arthroplasty   Status post revision right hip arthroplasty replant after antibiotic spacer 07/09/2024  Hgb 7.7, recheck in morning.  Can  consider transfusion if Hgb drops < 7.0 and/or becomes symptomatic.     Post op recs: WB: 50% partial weightbearing right LE, posterior hip precautions x 6 weeks  Abx: ancef  postop, follow-up IntraOp cultures.  Discharge on extended p.o. antibiotics.  IntraOp cultures no growth to date Imaging: PACU pelvis Xray Dressing: Prevena wound VAC to stay on for 1 week DVT prophylaxis: Eliquis  2.5 mg twice daily starting POD1 Follow up: 2 weeks after surgery for a wound check with Dr. Edna at [facility].  Address: [address], [place], [place] [address]  Office Phone: [phone]     Ashley [name] 07/11/2024, 6:21 PM     Contact information:   Weekdays 7am-5pm epic message Dr. Edna, or call office for patient follow up: [phone] After hours and holidays please check [email or web address] for group call information for [facility]

## 2024-07-11 NOTE — TOC Transition Note (Signed)
 Transition of Care [facility]) - Discharge Note   Patient Details  Name: Ashley [name] MRN: [id number] Date of Birth: 08-30-1976  Transition of Care [facility]) CM/SW Contact:  NORMAN ASPEN, LCSW Phone Number: 07/11/2024, 3:42 PM   Clinical Narrative:     Met with pt to review dc needs.  Pt confirms that she was actively receiving HHPT services via [facility] and will resume at dc.  Has needed DME.  Family able to assist as needed.  No noted IP CM needs at this time.  Final next level of care: Home w Home Health Services Barriers to Discharge: No Barriers Identified   Patient Goals and CMS Choice Patient states their goals for this hospitalization and ongoing recovery are:: return home          Discharge Placement                       Discharge Plan and Services Additional resources added to the After Visit Summary for                  DME Arranged: N/A DME Agency: NA       HH Arranged: PT HH Agency: [facility]        Social Drivers of Health (SDOH) Interventions SDOH Screenings   Food Insecurity: Patient Declined (07/10/2024)  Housing: Unknown (07/10/2024)  Transportation Needs: Patient Declined (07/10/2024)  Utilities: Patient Declined (07/10/2024)  Depression (PHQ2-9): Low Risk  (07/08/2020)  Tobacco Use: Low Risk  (07/09/2024)     Readmission Risk Interventions    07/11/2024    3:41 PM 04/05/2024    1:25 PM  Readmission Risk Prevention Plan  Post Dischage Appt Complete Complete  Medication Screening Complete Complete  Transportation Screening Complete Complete

## 2024-07-11 NOTE — Plan of Care (Signed)
   Problem: Clinical Measurements: Goal: Diagnostic test results will improve Outcome: Progressing

## 2024-07-11 NOTE — Plan of Care (Signed)
  Problem: Education: Goal: Knowledge of General Education information will improve Description: Including pain rating scale, medication(s)/side effects and non-pharmacologic comfort measures Outcome: Adequate for Discharge   Problem: Health Behavior/Discharge Planning: Goal: Ability to manage health-related needs will improve Outcome: Adequate for Discharge   Problem: Clinical Measurements: Goal: Ability to maintain clinical measurements within normal limits will improve Outcome: Progressing Goal: Will remain free from infection Outcome: Progressing Goal: Diagnostic test results will improve Outcome: Progressing Goal: Respiratory complications will improve Outcome: Progressing Goal: Cardiovascular complication will be avoided Outcome: Progressing   Problem: Activity: Goal: Risk for activity intolerance will decrease Outcome: Adequate for Discharge   Problem: Nutrition: Goal: Adequate nutrition will be maintained Outcome: Completed/Met   Problem: Coping: Goal: Level of anxiety will decrease Outcome: Progressing   Problem: Elimination: Goal: Will not experience complications related to bowel motility Outcome: Progressing Goal: Will not experience complications related to urinary retention Outcome: Completed/Met   Problem: Pain Managment: Goal: General experience of comfort will improve and/or be controlled Outcome: Progressing   Problem: Safety: Goal: Ability to remain free from injury will improve Outcome: Progressing   Problem: Skin Integrity: Goal: Risk for impaired skin integrity will decrease Outcome: Progressing   Problem: Education: Goal: Knowledge of the prescribed therapeutic regimen will improve Outcome: Adequate for Discharge Goal: Understanding of discharge needs will improve Outcome: Progressing Goal: Individualized Educational Video(s) Outcome: Completed/Met   Problem: Activity: Goal: Ability to avoid complications of mobility impairment will  improve Outcome: Adequate for Discharge Goal: Ability to tolerate increased activity will improve Outcome: Adequate for Discharge   Problem: Clinical Measurements: Goal: Postoperative complications will be avoided or minimized Outcome: Progressing   Problem: Pain Management: Goal: Pain level will decrease with appropriate interventions Outcome: Progressing   Problem: Skin Integrity: Goal: Will show signs of wound healing Outcome: Progressing

## 2024-07-12 LAB — CBC
HCT: 25.9 % — ABNORMAL LOW (ref 36.0–46.0)
Hemoglobin: 7.7 g/dL — ABNORMAL LOW (ref 12.0–15.0)
MCH: 24.9 pg — ABNORMAL LOW (ref 26.0–34.0)
MCHC: 29.7 g/dL — ABNORMAL LOW (ref 30.0–36.0)
MCV: 83.8 fL (ref 80.0–100.0)
Platelets: 171 K/uL (ref 150–400)
RBC: 3.09 MIL/uL — ABNORMAL LOW (ref 3.87–5.11)
RDW: 16.1 % — ABNORMAL HIGH (ref 11.5–15.5)
WBC: 10.1 K/uL (ref 4.0–10.5)
nRBC: 0 % (ref 0.0–0.2)

## 2024-07-12 MED ORDER — POLYETHYLENE GLYCOL 3350 17 G PO PACK
17.0000 g | PACK | Freq: Every day | ORAL | Status: DC
  Administered 2024-07-12 – 2024-07-13 (×2): 17 g via ORAL
  Filled 2024-07-12 (×3): qty 1

## 2024-07-12 MED ORDER — MAGNESIUM CITRATE PO SOLN
1.0000 | Freq: Once | ORAL | Status: AC
Start: 2024-07-12 — End: 2024-07-12
  Administered 2024-07-12: 1 via ORAL
  Filled 2024-07-12: qty 296

## 2024-07-12 MED ORDER — FLUCONAZOLE 150 MG PO TABS
150.0000 mg | ORAL_TABLET | Freq: Once | ORAL | Status: AC
  Administered 2024-07-12: 150 mg via ORAL
  Filled 2024-07-12: qty 1

## 2024-07-12 NOTE — Anesthesia Postprocedure Evaluation (Signed)
 Anesthesia Post Note  Patient: Ashley [name]  Procedure(s) Performed: REVISION, ARTHROPLASTY, HIP (Right: Hip)     Patient location during evaluation: PACU Anesthesia Type: Spinal Level of consciousness: oriented and awake and alert Pain management: pain level controlled Vital Signs Assessment: post-procedure vital signs reviewed and stable Respiratory status: spontaneous breathing, respiratory function stable and patient connected to nasal cannula oxygen Cardiovascular status: blood pressure returned to baseline and stable Postop Assessment: no headache, no backache, no apparent nausea or vomiting, spinal receding and patient able to bend at knees Anesthetic complications: no   No notable events documented.                  Anamari Galeas,W. EDMOND

## 2024-07-12 NOTE — Progress Notes (Signed)
 Physical Therapy Treatment Patient Details Name: Ashley [name] MRN: [id number] DOB: Aug 01, 1976 Today's Date: 07/12/2024   History of Present Illness Ashley [name] is a 48 y.o. 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/24, HTN, depression/anxiety, s/p sleeve gastrectomy, s/p right THA 2017, chronic pain syndrome    PT Comments  Pt continues to make progress, recalls 3/3 THP and hs awareness of 50%PWB. Amb hallway distance and reviewed stairs using posterior technique to maintain PWB, pt tolerated well. Pt reports she is hopeful to d/c tomorrow, ready to d/c from PT standpoint with family assisting as needed.    If plan is discharge home, recommend the following: Assist for transportation;Help with stairs or ramp for entrance   Can travel by private vehicle        Equipment Recommendations  None recommended by PT    Recommendations for Other Services       Precautions / Restrictions Precautions Precautions: Posterior Hip;Fall Precaution/Restrictions Comments: incisional VAC Restrictions RLE Weight Bearing Per Provider Order: Partial weight bearing RLE Partial Weight Bearing Percentage or Pounds: 50%     Mobility  Bed Mobility Overal bed mobility: Needs Assistance Bed Mobility: Supine to Sit, Sit to Supine     Supine to sit: Supervision Sit to supine: Contact guard assist   General bed mobility comments: ablt to use gait belt to self assist    Transfers Overall transfer level: Needs assistance Equipment used: Rolling walker (2 wheels) Transfers: Sit to/from Stand Sit to Stand: Supervision, Modified independent (Device/Increase time)           General transfer comment: ongoing education on RLE position for STS transfers    Ambulation/Gait Ambulation/Gait assistance: Supervision Gait  Distance (Feet): 250 Feet Assistive device: Rolling walker (2 wheels) Gait Pattern/deviations: Step-to pattern       General Gait Details: ongoing education on  sequence, PWB,  to avoid incr step length on LLE, use of UEs to maintain PWB;   Stairs Stairs: Yes Stairs assistance: Contact guard assist, Min assist Stair Management: No rails, Step to pattern, With walker, Backwards Number of Stairs: 3 General stair comments: cues for sequence and technique, assist to manage RW, no LOB; good adherence with PWB   Wheelchair Mobility     Tilt Bed    Modified Rankin (Stroke Patients Only)       Balance Overall balance assessment: No apparent balance deficits (not formally assessed)                                          Communication Communication Communication: No apparent difficulties  Cognition Arousal: Alert Behavior During Therapy: WFL for tasks assessed/performed   PT - Cognitive impairments: No apparent impairments                         Following commands: Intact      Cueing Cueing Techniques: Verbal cues  Exercises      General Comments        Pertinent Vitals/Pain Pain Assessment Pain Assessment: Faces Faces Pain Scale: Hurts little more Pain Location: right hip Pain Descriptors / Indicators: Discomfort, Sore Pain Intervention(s): Limited activity within patient's tolerance, Monitored during session, Premedicated before session, Repositioned  Home Living                          Prior Function            PT Goals (current goals can now be found in the care plan section) Acute Rehab PT Goals PT Goal Formulation: With patient Time For Goal Achievement: 07/17/24 Potential to Achieve Goals: Good Progress towards PT goals: Progressing toward goals    Frequency    7X/week      PT Plan      Co-evaluation              AM-PAC PT 6 Clicks Mobility   Outcome Measure  Help needed turning  from your back to your side while in a flat bed without using bedrails?: A Little Help needed moving from lying on your back to sitting on the side of a flat bed without using bedrails?: A Little Help needed moving to and from a bed to a chair (including a wheelchair)?: A Little Help needed standing up from a chair using your arms (e.g., wheelchair or bedside chair)?: A Little Help needed to walk in hospital room?: A Little Help needed climbing 3-5 steps with a railing? : A Little 6 Click Score: 18    End of Session Equipment Utilized During Treatment: Gait belt Activity Tolerance: Patient tolerated treatment well Patient left: with call bell/phone within reach;in bed (alarm not activated on arrival) Nurse Communication: Mobility status PT Visit Diagnosis: Other abnormalities of gait and mobility (R26.89)     Time: 8388-8364 PT Time Calculation (min) (ACUTE ONLY): 24 min  Charges:    $Gait Training: 23-37 mins PT General Charges $$ ACUTE PT VISIT: 1 Visit                     Markia Kyer, PT  Acute Rehab Dept [facility]) [phone]  07/12/2024    [facility] 07/12/2024, 4:44 PM

## 2024-07-12 NOTE — Progress Notes (Signed)
 Physical Therapy Treatment Patient Details Name: Ashley [name] MRN: [id number] DOB: 1976/07/07 Today's Date: 07/12/2024   History of Present Illness Ashley [name] is a 48 y.o. 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/24, HTN, depression/anxiety, s/p sleeve gastrectomy, s/p right THA 2017, chronic pain syndrome    PT Comments  Pt progressing well, amb 250' with RW, supervision. No dizziness or LOB. Ongoing education on PWB and posterior THP. Pt states she feels she needs another day in the hospital d/t not having had bm, feeling fatigued. Pt could likely d/c from PT standpoint. Will work on stairs this afternoon if pt agreeable.    If plan is discharge home, recommend the following: Assist for transportation;Help with stairs or ramp for entrance   Can travel by private vehicle        Equipment Recommendations  None recommended by PT    Recommendations for Other Services       Precautions / Restrictions Precautions Precautions: Posterior Hip;Fall Precaution/Restrictions Comments: incisional VAC Restrictions RLE Weight Bearing Per Provider Order: Partial weight bearing RLE Partial Weight Bearing Percentage or Pounds: 50%     Mobility  Bed Mobility               General bed mobility comments: in recliner    Transfers Overall transfer level: Needs assistance Equipment used: Rolling walker (2 wheels) Transfers: Sit to/from Stand Sit to Stand: Supervision           General transfer comment: ongoign education on RLE position for STS transfers    Ambulation/Gait Ambulation/Gait assistance: Supervision Gait Distance (Feet): 250 Feet Assistive device: Rolling walker (2 wheels) Gait Pattern/deviations: Step-to pattern       General Gait Details: ongoing education on  sequence, PWB,  to  avoid incr step length on LLE, use of UEs to maintain PWB;   Stairs             Wheelchair Mobility     Tilt Bed    Modified Rankin (Stroke Patients Only)       Balance Overall balance assessment: No apparent balance deficits (not formally assessed)                                          Communication Communication Communication: No apparent difficulties  Cognition Arousal: Alert Behavior During Therapy: WFL for tasks assessed/performed   PT - Cognitive impairments: No apparent impairments                         Following commands: Intact      Cueing Cueing Techniques: Verbal cues  Exercises      General Comments        Pertinent Vitals/Pain Pain Assessment Pain Assessment: Faces Faces Pain Scale: Hurts a little bit Pain Location: right hip Pain Descriptors / Indicators: Discomfort, Sore Pain Intervention(s): Limited activity within patient's tolerance, Monitored during session, Premedicated before session, Repositioned    Home Living                          Prior Function            PT Goals (current goals can now be found in  the care plan section) Acute Rehab PT Goals PT Goal Formulation: With patient Time For Goal Achievement: 07/17/24 Potential to Achieve Goals: Good Progress towards PT goals: Progressing toward goals    Frequency    7X/week      PT Plan      Co-evaluation              AM-PAC PT 6 Clicks Mobility   Outcome Measure  Help needed turning from your back to your side while in a flat bed without using bedrails?: A Little Help needed moving from lying on your back to sitting on the side of a flat bed without using bedrails?: A Little Help needed moving to and from a bed to a chair (including a wheelchair)?: A Little Help needed standing up from a chair using your arms (e.g., wheelchair or bedside chair)?: A Little Help needed to walk in hospital room?: A Little Help  needed climbing 3-5 steps with a railing? : A Little 6 Click Score: 18    End of Session Equipment Utilized During Treatment: Gait belt Activity Tolerance: Patient tolerated treatment well Patient left: Other (comment);with call bell/phone within reach (bathroom) Nurse Communication: Mobility status PT Visit Diagnosis: Other abnormalities of gait and mobility (R26.89)     Time: 8974-8955 PT Time Calculation (min) (ACUTE ONLY): 19 min  Charges:    $Gait Training: 8-22 mins PT General Charges $$ ACUTE PT VISIT: 1 Visit                     Kely Dohn, PT  Acute Rehab Dept [facility]) [phone]  07/12/2024    [facility] 07/12/2024, 10:58 AM

## 2024-07-12 NOTE — Plan of Care (Signed)
  Problem: Education: Goal: Knowledge of General Education information will improve Description: Including pain rating scale, medication(s)/side effects and non-pharmacologic comfort measures Outcome: Completed/Met   Problem: Health Behavior/Discharge Planning: Goal: Ability to manage health-related needs will improve Outcome: Adequate for Discharge   Problem: Clinical Measurements: Goal: Ability to maintain clinical measurements within normal limits will improve Outcome: Adequate for Discharge Goal: Will remain free from infection Outcome: Progressing Goal: Diagnostic test results will improve Outcome: Progressing Goal: Respiratory complications will improve Outcome: Adequate for Discharge Goal: Cardiovascular complication will be avoided Outcome: Adequate for Discharge   Problem: Activity: Goal: Risk for activity intolerance will decrease Outcome: Adequate for Discharge   Problem: Coping: Goal: Level of anxiety will decrease Outcome: Progressing   Problem: Elimination: Goal: Will not experience complications related to bowel motility Outcome: Completed/Met   Problem: Pain Managment: Goal: General experience of comfort will improve and/or be controlled Outcome: Progressing   Problem: Safety: Goal: Ability to remain free from injury will improve Outcome: Adequate for Discharge   Problem: Skin Integrity: Goal: Risk for impaired skin integrity will decrease Outcome: Adequate for Discharge   Problem: Education: Goal: Knowledge of the prescribed therapeutic regimen will improve Outcome: Completed/Met Goal: Understanding of discharge needs will improve Outcome: Progressing   Problem: Activity: Goal: Ability to avoid complications of mobility impairment will improve Outcome: Adequate for Discharge Goal: Ability to tolerate increased activity will improve Outcome: Adequate for Discharge   Problem: Pain Management: Goal: Pain level will decrease with appropriate  interventions Outcome: Adequate for Discharge   Problem: Skin Integrity: Goal: Will show signs of wound healing Outcome: Adequate for Discharge

## 2024-07-12 NOTE — Progress Notes (Signed)
     Subjective:  Patient reports pain as moderate.  She has not had a bowel movement yet.  Adding magnesium  citrate to her bowel regimen today.  Also concerned about a yeast infection.  Has previously used Diflucan .  Hemoglobin is stable today.  Discussed possible discharge home later today or tomorrow depending on pain control and mobility with therapy.  Yesterday's total administered Morphine  Milligram Equivalents: 95   Objective:   VITALS:   Vitals:   07/10/24 1959 07/11/24 0655 07/11/24 1953 07/12/24 0421  BP: 117/63 (!) 112/55 112/60 (!) 115/59  Pulse: 88 72 93 85  Resp: 16 15 18 17   Temp: 98.9 F (37.2 C) 98.7 F (37.1 C) 98.4 F (36.9 C) 98.8 F (37.1 C)  TempSrc: Oral Oral Oral Oral  SpO2: 100% 100% 100% 100%  Weight:      Height:        Resting comfortably in bed in NAD Sensation intact distally Intact pulses distally Dorsiflexion/Plantar flexion intact Incision: dressing C/D/I Compartment soft Wound VAC holding suction no output in canister Wiggles toes appropriately   Lab Results  Component Value Date   WBC 10.1 07/12/2024   HGB 7.7 (L) 07/12/2024   HCT 25.9 (L) 07/12/2024   MCV 83.8 07/12/2024   PLT 171 07/12/2024   BMET    Component Value Date/Time   NA 141 07/10/2024 0314   NA 139 10/21/2023 1054   K 4.9 07/10/2024 0314   CL 106 07/10/2024 0314   CO2 27 07/10/2024 0314   GLUCOSE 217 (H) 07/10/2024 0314   BUN 16 07/10/2024 0314   BUN 8 10/21/2023 1054   CREATININE 0.88 07/10/2024 0314   CALCIUM  8.9 07/10/2024 0314   CALCIUM  8.3 (L) 08/28/2010 0525   EGFR 91 10/21/2023 1054   GFRNONAA >60 07/10/2024 0314    Xray: Revision total hip arthroplasty components in good position no adverse features  Assessment/Plan: 3 Days Post-Op   Principal Problem:   Failed total hip arthroplasty   Status post revision right hip arthroplasty replant after antibiotic spacer 07/09/2024  Hgb 7.7 unchanged from yesterday.  No indication for transfusion  currently.   Post op recs: WB: 50% partial weightbearing right LE, posterior hip precautions x 6 weeks  Abx: ancef  postop, follow-up IntraOp cultures.  Discharge on extended p.o. antibiotics.  IntraOp cultures no growth to date Imaging: PACU pelvis Xray Dressing: Prevena wound VAC to stay on for 1 week DVT prophylaxis: Eliquis  2.5 mg twice daily starting POD1 Follow up: 2 weeks after surgery for a wound check with Dr. Edna at [facility].  Address: [address], [place], [place] [address]  Office Phone: [phone]     TORIBIO DELENA EDNA 07/12/2024, 6:49 AM     Contact information:   Weekdays 7am-5pm epic message Dr. Edna, or call office for patient follow up: [phone] After hours and holidays please check [email or web address] for group call information for [facility]

## 2024-07-13 ENCOUNTER — Other Ambulatory Visit (HOSPITAL_COMMUNITY): Payer: Self-pay

## 2024-07-13 LAB — CBC
HCT: 23.8 % — ABNORMAL LOW (ref 36.0–46.0)
Hemoglobin: 7.1 g/dL — ABNORMAL LOW (ref 12.0–15.0)
MCH: 24.8 pg — ABNORMAL LOW (ref 26.0–34.0)
MCHC: 29.8 g/dL — ABNORMAL LOW (ref 30.0–36.0)
MCV: 83.2 fL (ref 80.0–100.0)
Platelets: 185 K/uL (ref 150–400)
RBC: 2.86 MIL/uL — ABNORMAL LOW (ref 3.87–5.11)
RDW: 16 % — ABNORMAL HIGH (ref 11.5–15.5)
WBC: 8.7 K/uL (ref 4.0–10.5)
nRBC: 0 % (ref 0.0–0.2)

## 2024-07-13 LAB — PREPARE RBC (CROSSMATCH)

## 2024-07-13 MED ORDER — SODIUM CHLORIDE 0.9% IV SOLUTION: Freq: Once | INTRAVENOUS | Status: AC

## 2024-07-13 NOTE — Care Management Important Message (Signed)
 Important Message  Patient Details  Name: CAIDANCE SYBERT MRN: [id number] Date of Birth: [birth year]   Important Message Given:        Glade Cuff 07/13/2024, 12:22 PM

## 2024-07-13 NOTE — Progress Notes (Signed)
 PT Note  Patient Details Name: Ashley [name] MRN: [id number] DOB: 05-19-1976   Cancelled Treatment:    Reason Eval/Treat Not Completed: Other (comment). Pt declined PT today. Pt is to have transfusion secondary to HgB 7.1 and then d/c home. If pt remains in hospital PT to continue to follow acutely.   [place], PT Acute Rehab   [place] Ashley [name] 07/13/2024, 11:51 AM

## 2024-07-13 NOTE — Progress Notes (Signed)
 Discharge medications delivered to patient at bedside in a secure bag per Lanning Ryder RN

## 2024-07-13 NOTE — Discharge Summary (Signed)
 Physician Discharge Summary  Patient ID: Ashley [name] MRN: [id number] DOB/AGE: Jan 29, 1976 48 y.o.  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   Complication of anesthesia    Coronary artery disease    Depression    History of pneumonia    Hypertension    Infertility, female    Joint pain    Lumbar spondylosis    Neuromuscular disease (HCC)    numbness, paresthesias RLE, from failed lumbar back surgery   Osteoarthritis    PCOS (polycystic ovarian syndrome)    Pneumonia    2016   PONV (postoperative nausea and vomiting)    Almost a week to wake up   Pre-diabetes    Swelling of lower extremity     Surgeries: Procedure(s): REVISION, ARTHROPLASTY, HIP on 07/09/2024   Consultants (if any):   Discharged Condition: Improved  Hospital Course: Ashley [name] is an 48 y.o. female who was admitted 07/09/2024 with a diagnosis of Failed total hip arthroplasty and went to the operating room on 07/09/2024 and underwent the above named procedures.    She was given perioperative antibiotics:  Anti-infectives (From admission, onward)    Start     Dose/Rate Route Frequency Ordered Stop   07/12/24 2200  cefadroxil (DURICEF) capsule 500 mg        500 mg Oral 2 times daily 07/09/24 1835 07/19/24 2159   07/12/24 0900  fluconazole  (DIFLUCAN ) tablet 150 mg        150 mg Oral  Once 07/12/24 0650 07/12/24 1000   07/09/24 2200  ceFAZolin  (ANCEF ) IVPB 2g/100 mL premix        2 g 200 mL/hr over 30 Minutes Intravenous Every 8 hours 07/09/24 1835 07/12/24 1424   07/09/24 1045  ceFAZolin  (ANCEF ) IVPB 2g/100 mL premix        2 g 200 mL/hr over 30 Minutes  Intravenous On call to O.R. 07/09/24 1035 07/09/24 1338   07/09/24 0000  cefadroxil (DURICEF) 500 MG capsule        500 mg Oral 2 times daily 07/09/24 1007 07/16/24 2359     .  She was given sequential compression devices, early ambulation, and Eliquis  for DVT prophylaxis.  She benefited maximally from the hospital stay and there were no complications.    Recent vital signs:  Vitals:   07/12/24 2223 07/13/24 0532  BP: 120/62 117/70  Pulse: 91 79  Resp: 18 18  Temp: 98.3 F (36.8 C) 98.4 F (36.9 C)  SpO2: 96% 99%    Recent laboratory studies:  Lab Results  Component Value Date   HGB 7.1 (L) 07/13/2024   HGB 7.7 (L) 07/12/2024   HGB 7.7 (L) 07/11/2024   Lab Results  Component Value Date   WBC 8.7 07/13/2024   PLT 185 07/13/2024   Lab Results  Component Value Date   INR 0.95 09/14/2013   Lab Results  Component Value Date   NA 141 07/10/2024   K 4.9 07/10/2024   CL 106 07/10/2024   CO2 27 07/10/2024   BUN 16 07/10/2024   CREATININE 0.88 07/10/2024   GLUCOSE 217 (H) 07/10/2024  Discharge Medications:   Allergies as of 07/13/2024       Reactions   Gadolinium Derivatives Nausea And Vomiting, Other (See Comments)   Pt describes this happens every time she gets gado even with slow injection   Daptomycin  Other (See Comments)   Altered mental status   Codeine Itching, Nausea Only, Other (See Comments)   Tolerates oxycodone     Duloxetine    altered mental status        Medication List     TAKE these medications    acetaminophen  500 MG tablet Commonly known as: TYLENOL  Take 2 tablets (1,000 mg total) by mouth every 8 (eight) hours as needed.   apixaban  2.5 MG Tabs tablet Commonly known as: Eliquis  Take 1 tablet (2.5 mg total) by mouth 2 (two) times daily.   baclofen  10 MG tablet Commonly known as: LIORESAL  Take 1 tablet by mouth twice a day   BARIATRIC MULTIVITAMINS/IRON PO Take 2 tablets by mouth in the morning.   buPROPion  150 MG 24 hr  tablet Commonly known as: WELLBUTRIN  XL Take 150 mg by mouth in the morning.   calcium  carbonate 750 MG chewable tablet Commonly known as: TUMS EX Chew 2 tablets by mouth daily before lunch.   carvedilol  6.25 MG tablet Commonly known as: COREG  Take 6.25 mg by mouth in the morning.   cefadroxil 500 MG capsule Commonly known as: DURICEF Take 1 capsule (500 mg total) by mouth 2 (two) times daily for 7 days.   celecoxib  100 MG capsule Commonly known as: CeleBREX  Take 1 capsule (100 mg total) by mouth 2 (two) times daily for 14 days.   chlorhexidine  4 % external liquid Commonly known as: HIBICLENS  Apply 15 mLs (1 Application total) topically as directed for 30 doses. Use as directed daily for 5 days every other week for 6 weeks.   docusate sodium  100 MG capsule Commonly known as: COLACE Take 200 mg by mouth daily as needed for mild constipation.   EPINEPHrine  0.3 mg/0.3 mL Soaj injection Commonly known as: EPI-PEN Inject 0.3 mg into the muscle once.   furosemide  20 MG tablet Commonly known as: LASIX  Take 20 mg by mouth daily as needed for edema.   K2 PLUS D3 PO Take 2 capsules by mouth in the morning.   lidocaine  5 % Commonly known as: LIDODERM  Apply 3 patches to skin once daily as directed-remove after 12 hours What changed:  how much to take when to take this reasons to take this   lisdexamfetamine 50 MG capsule Commonly known as: VYVANSE  Take 50 mg by mouth in the morning.   losartan -hydrochlorothiazide  100-12.5 MG tablet Commonly known as: HYZAAR Take 1 tablet by mouth in the morning.   Movantik  25 MG Tabs tablet Generic drug: naloxegol  oxalate Take 1 tablet (25 mg total) by mouth daily.   mupirocin ointment 2 % Commonly known as: BACTROBAN Place 1 Application into the nose 2 (two) times daily for 60 doses. Use as directed 2 times daily for 5 days every other week for 6 weeks.   omeprazole  40 MG capsule Commonly known as: PRILOSEC Take 1 capsule (40 mg  total) by mouth daily for 21 days.   ondansetron  4 MG tablet Commonly known as: Zofran  Take 1 tablet (4 mg total) by mouth every 8 (eight) hours as needed for up to 14 days for nausea or vomiting.   oxyCODONE  5 MG immediate release tablet Commonly known as: Roxicodone  Take 1 tablet (5 mg total) by mouth every 4 (four)  hours as needed for up to 7 days for severe pain (pain score 7-10) or moderate pain (pain score 4-6).   oxyCODONE -acetaminophen  10-325 MG tablet Commonly known as: Percocet Take 1 tablet by mouth every 6 (six) hours as directed 06/21/24   polyethylene glycol 17 g packet Commonly known as: MiraLax  Take 17 g by mouth daily. What changed:  when to take this reasons to take this   polyethylene glycol powder 17 GM/SCOOP powder Commonly known as: MiraLax  Take 17 g by mouth daily. Dissolve 1 capful (17g) in 4-8 ounces of liquid and take by mouth daily. What changed: You were already taking a medication with the same name, and this prescription was added. Make sure you understand how and when to take each.   pregabalin  75 MG capsule Commonly known as: Lyrica  Take 1 capsule (75 mg total) by mouth 3 (three) times daily.   ProAir  RespiClick 108 (90 Base) MCG/ACT Aepb Generic drug: Albuterol  Sulfate Inhale 1 puff into the lungs every 4 (four) hours as needed.   rosuvastatin  10 MG tablet Commonly known as: CRESTOR  Take 1 tablet (10 mg total) by mouth daily.   Xyzal Allergy 24HR 5 MG tablet Generic drug: levocetirizine Take 5 mg by mouth daily as needed for allergies.   zolpidem  10 MG tablet Commonly known as: AMBIEN  Take 10 mg by mouth at bedtime as needed for sleep.        Diagnostic Studies: DG HIP UNILAT W OR W/O PELVIS 2-3 VIEWS RIGHT Result Date: 07/09/2024 CLINICAL DATA:  Status post hip revision EXAM: DG HIP (WITH OR WITHOUT PELVIS) 3V RIGHT COMPARISON:  Intraoperative films from earlier in the same day. FINDINGS: Right hip prosthesis is again seen in  satisfactory position. No acute fracture is noted. No soft tissue changes are seen. Postsurgical change in the lumbar spine is noted. IMPRESSION: Status post right hip revision. Electronically Signed   By: Oneil Devonshire M.D.   On: 07/09/2024 20:18   DG HIP UNILAT WITH PELVIS 2-3 VIEWS RIGHT Result Date: 07/09/2024 CLINICAL DATA:  Right hip revision EXAM: DG HIP (WITH OR WITHOUT PELVIS) 2-3V RIGHT COMPARISON:  04/17/2024 FLUOROSCOPY TIME:  Radiation Exposure Index (as provided by the fluoroscopic device): 0.98 mGy If the device does not provide the exposure index: Fluoroscopy Time:  3 seconds Number of Acquired Images:  6 FINDINGS: Initial images show no revision of the acetabular component and a medullary reaming device in place. Subsequent femoral prosthesis is seen in satisfactory position. No soft tissue abnormality is noted. IMPRESSION: Right hip prosthesis revision Electronically Signed   By: Oneil Devonshire M.D.   On: 07/09/2024 20:17   DG C-Arm 1-60 Min-No Report Result Date: 07/09/2024 Fluoroscopy was utilized by the requesting physician.  No radiographic interpretation.   DG C-Arm 1-60 Min-No Report Result Date: 07/09/2024 Fluoroscopy was utilized by the requesting physician.  No radiographic interpretation.   DG C-Arm 1-60 Min-No Report Result Date: 07/09/2024 Fluoroscopy was utilized by the requesting physician.  No radiographic interpretation.    Disposition: Discharge disposition: 01-Home or Self Care       Discharge Instructions     Call MD / Call 911   Complete by: As directed    If you experience chest pain or shortness of breath, CALL 911 and be transported to the hospital emergency room.  If you develope a fever above 101 F, pus (white drainage) or increased drainage or redness at the wound, or calf pain, call your surgeon's office.   Constipation Prevention  Complete by: As directed    Drink plenty of fluids.  Prune juice may be helpful.  You may use a stool softener,  such as Colace (over the counter) 100 mg twice a day.  Use MiraLax  (over the counter) for constipation as needed.   Diet - low sodium heart healthy   Complete by: As directed    Increase activity slowly as tolerated   Complete by: As directed    Post-operative opioid taper instructions:   Complete by: As directed    POST-OPERATIVE OPIOID TAPER INSTRUCTIONS: It is important to wean off of your opioid medication as soon as possible. If you do not need pain medication after your surgery it is ok to stop day one. Opioids include: Codeine, Hydrocodone (Norco, Vicodin), Oxycodone (Percocet, oxycontin ) and hydromorphone  amongst others.  Long term and even short term use of opiods can cause: Increased pain response Dependence Constipation Depression Respiratory depression And more.  Withdrawal symptoms can include Flu like symptoms Nausea, vomiting And more Techniques to manage these symptoms Hydrate well Eat regular healthy meals Stay active Use relaxation techniques(deep breathing, meditating, yoga) Do Not substitute Alcohol  to help with tapering If you have been on opioids for less than two weeks and do not have pain than it is ok to stop all together.  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 Maintain the same interval or time between taking each dose and first decrease the dose.  Cut the total daily intake of opioids by one tablet each day Next start to increase the time between doses. The last dose that should be eliminated is the evening dose.           Follow-up Information     Edna Toribio LABOR, MD Follow up in 1 week(s).   Specialty: Orthopedic Surgery Contact information: [address] [place] [address] [phone]                    Discharge Instructions      INSTRUCTIONS AFTER JOINT REPLACEMENT   Remove items at home which could result in a fall. This includes throw  rugs or furniture in walking pathways ICE to the affected joint every three hours while awake for 30 minutes at a time, for at least the first 3-5 days, and then as needed for pain and swelling.  Continue to use ice for pain and swelling. You may notice swelling that will progress down to the foot and ankle.  This is normal after surgery.  Elevate your leg when you are not up walking on it.   Continue to use the breathing machine you got in the hospital (incentive spirometer) which will help keep your temperature down.  It is common for your temperature to cycle up and down following surgery, especially at night when you are not up moving around and exerting yourself.  The breathing machine keeps your lungs expanded and your temperature down.  DIET:  As you were doing prior to hospitalization, we recommend a well-balanced diet.  DRESSING / WOUND CARE / SHOWERING:  Keep the surgical dressing until follow up.  The dressing is water  proof, so you can shower without any extra covering.  IF THE DRESSING FALLS OFF or the wound gets wet inside, change the dressing with sterile gauze.  Please use good hand washing techniques before changing the dressing.  Do not use any lotions or creams on the incision until instructed  by your surgeon.    ACTIVITY  Increase activity slowly as tolerated, but follow the weight bearing instructions below.   No driving for 6 weeks or until further direction given by your physician.  You cannot drive while taking narcotics.  No lifting or carrying greater than 10 lbs. until further directed by your surgeon. Avoid periods of inactivity such as sitting longer than an hour when not asleep. This helps prevent blood clots.  You may return to work once you are authorized by your doctor.   WEIGHT BEARING: Weight bearing as tolerated with assist device (walker, cane, etc) as directed, use it as long as suggested by your surgeon or therapist, typically at least 4-6  weeks.  EXERCISES  Results after joint replacement surgery are often greatly improved when you follow the exercise, range of motion and muscle strengthening exercises prescribed by your doctor. Safety measures are also important to protect the joint from further injury. Any time any of these exercises cause you to have increased pain or swelling, decrease what you are doing until you are comfortable again and then slowly increase them. If you have problems or questions, call your caregiver or physical therapist for advice.   Rehabilitation is important following a joint replacement. After just a few days of immobilization, the muscles of the leg can become weakened and shrink (atrophy).  These exercises are designed to build up the tone and strength of the thigh and leg muscles and to improve motion. Often times heat used for twenty to thirty minutes before working out will loosen up your tissues and help with improving the range of motion but do not use heat for the first two weeks following surgery (sometimes heat can increase post-operative swelling).   These exercises can be done on a training (exercise) mat, on the floor, on a table or on a bed. Use whatever works the best and is most comfortable for you.    Use music or television while you are exercising so that the exercises are a pleasant break in your day. This will make your life better with the exercises acting as a break in your routine that you can look forward to.   Perform all exercises about fifteen times, three times per day or as directed.  You should exercise both the operative leg and the other leg as well.  Exercises include:   Quad Sets - Tighten up the muscle on the front of the thigh (Quad) and hold for 5-10 seconds.   Straight Leg Raises - With your knee straight (if you were given a brace, keep it on), lift the leg to 60 degrees, hold for 3 seconds, and slowly lower the leg.  Perform this exercise against resistance later as  your leg gets stronger.  Leg Slides: Lying on your back, slowly slide your foot toward your buttocks, bending your knee up off the floor (only go as far as is comfortable). Then slowly slide your foot back down until your leg is flat on the floor again.  Angel Wings: Lying on your back spread your legs to the side as far apart as you can without causing discomfort.  Hamstring Strength:  Lying on your back, push your heel against the floor with your leg straight by tightening up the muscles of your buttocks.  Repeat, but this time bend your knee to a comfortable angle, and push your heel against the floor.  You may put a pillow under the heel to make it more comfortable if  necessary.   A rehabilitation program following joint replacement surgery can speed recovery and prevent re-injury in the future due to weakened muscles. Contact your doctor or a physical therapist for more information on knee rehabilitation.   CONSTIPATION:  Constipation is defined medically as fewer than three stools per week and severe constipation as less than one stool per week.  Even if you have a regular bowel pattern at home, your normal regimen is likely to be disrupted due to multiple reasons following surgery.  Combination of anesthesia, postoperative narcotics, change in appetite and fluid intake all can affect your bowels.   YOU MUST use at least one of the following options; they are listed in order of increasing strength to get the job done.  They are all available over the counter, and you may need to use some, POSSIBLY even all of these options:    Drink plenty of fluids (prune juice may be helpful) and high fiber foods Colace 100 mg by mouth twice a day  Senokot for constipation as directed and as needed Dulcolax (bisacodyl ), take with full glass of water   Miralax  (polyethylene glycol) once or twice a day as needed.  If you have tried all these things and are unable to have a bowel movement in the first 3-4 days  after surgery call either your surgeon or your primary doctor.    If you experience loose stools or diarrhea, hold the medications until you stool forms back up.  If your symptoms do not get better within 1 week or if they get worse, check with your doctor.  If you experience the worst abdominal pain ever or develop nausea or vomiting, please contact the office immediately for further recommendations for treatment.  ITCHING:  If you experience itching with your medications, try taking only a single pain pill, or even half a pain pill at a time.  You can also use Benadryl  over the counter for itching or also to help with sleep.   TED HOSE STOCKINGS:  Use stockings on both legs until for at least 2 weeks or as directed by physician office. They may be removed at night for sleeping.  MEDICATIONS:  See your medication summary on the "After Visit Summary" that nursing will review with you.  You may have some home medications which will be placed on hold until you complete the course of blood thinner medication.  It is important for you to complete the blood thinner medication as prescribed.  Blood clot prevention (DVT Prophylaxis): After surgery you are at an increased risk for a blood clot.  You were prescribed a blood thinner, Eliquis , to be taken twice daily for a total of 4 weeks from surgery to help reduce your risk of getting a blood clot.  Signs of a pulmonary embolus (blood clot in the lungs) include sudden short of breath, feeling lightheaded or dizzy, chest pain with a deep breath, rapid pulse rapid breathing.  Signs of a blood clot in your arms or legs include new unexplained swelling and cramping, warm, red or darkened skin around the painful area.  Please call the office or 911 right away if these signs or symptoms develop.  PRECAUTIONS:   If you experience chest pain or shortness of breath - call 911 immediately for transfer to the hospital emergency department.   If you develop a fever  greater that 101 F, purulent drainage from wound, increased redness or drainage from wound, foul odor from the wound/dressing, or calf pain - CONTACT  YOUR SURGEON.                                                   FOLLOW-UP APPOINTMENTS:  If you do not already have a post-op appointment, please call the office for an appointment to be seen by your surgeon.  Guidelines for how soon to be seen are listed in your "After Visit Summary", but are typically between 2-3 weeks after surgery.  If you have a specialized bandage, you may be told to follow up 1 week after surgery.  POST-OPERATIVE OPIOID TAPER INSTRUCTIONS: It is important to wean off of your opioid medication as soon as possible. If you do not need pain medication after your surgery it is ok to stop day one. Opioids include: Codeine, Hydrocodone (Norco, Vicodin), Oxycodone (Percocet, oxycontin ) and hydromorphone  amongst others.  Long term and even short term use of opiods can cause: Increased pain response Dependence Constipation Depression Respiratory depression And more.  Withdrawal symptoms can include Flu like symptoms Nausea, vomiting And more Techniques to manage these symptoms Hydrate well Eat regular healthy meals Stay active Use relaxation techniques(deep breathing, meditating, yoga) Do Not substitute Alcohol  to help with tapering If you have been on opioids for less than two weeks and do not have pain than it is ok to stop all together.  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 Maintain the same interval or time between taking each dose and first decrease the dose.  Cut the total daily intake of opioids by one tablet each day Next start to increase the time between doses. The last dose that should be eliminated is the evening dose.   MAKE SURE YOU:  Understand these instructions.  Get help right away if you are not doing well or get worse.    Thank you for  letting us  be a part of your medical care team.  It is a privilege we respect greatly.  We hope these instructions will help you stay on track for a fast and full recovery!        Information on my medicine - ELIQUIS  (apixaban )  This medication education was reviewed with me or my healthcare representative as part of my discharge preparation.  The pharmacist that spoke with me during my hospital stay was:    Why was Eliquis  prescribed for you? Eliquis  was prescribed for you to reduce the risk of blood clots forming after orthopedic surgery.    What do You need to know about Eliquis ? Take your Eliquis  TWICE DAILY - one tablet in the morning and one tablet in the evening with or without food.  It would be best to take the dose about the same time each day.  If you have difficulty swallowing the tablet whole please discuss with your pharmacist how to take the medication safely.  Take Eliquis  exactly as prescribed by your doctor and DO NOT stop taking Eliquis  without talking to the doctor who prescribed the medication.  Stopping without other medication to take the place of Eliquis  may increase your risk of developing a clot.  After discharge, you should have regular check-up appointments with your healthcare provider that is prescribing your Eliquis .  What do you do if you miss a dose? If a dose of ELIQUIS  is not taken at the scheduled  time, take it as soon as possible on the same day and twice-daily administration should be resumed.  The dose should not be doubled to make up for a missed dose.  Do not take more than one tablet of ELIQUIS  at the same time.  Important Safety Information A possible side effect of Eliquis  is bleeding. You should call your healthcare provider right away if you experience any of the following: Bleeding from an injury or your nose that does not stop. Unusual colored urine (red or dark brown) or unusual colored stools (red or black). Unusual bruising  for unknown reasons. A serious fall or if you hit your head (even if there is no bleeding).  Some medicines may interact with Eliquis  and might increase your risk of bleeding or clotting while on Eliquis . To help avoid this, consult your healthcare provider or pharmacist prior to using any new prescription or non-prescription medications, including herbals, vitamins, non-steroidal anti-inflammatory drugs (NSAIDs) and supplements.  This website has more information on Eliquis  (apixaban ): http://[email or web address] .com/eliquis dena     Signed: Meaghan Whistler A Voris Tigert 07/13/2024, 7:18 AM

## 2024-07-13 NOTE — Progress Notes (Addendum)
     Subjective:  Patient reports pain as moderate.  She had a small bowel movement yesterday. Hgb slowly downtrending, today 7.1. Pain moderately controlled. Patient hopeful to go home. Wound vac was no longer holding suction so switched today to an aquacell dressing.  Yesterday's total administered Morphine  Milligram Equivalents: 45   Objective:   VITALS:   Vitals:   07/12/24 1100 07/12/24 1300 07/12/24 2223 07/13/24 0532  BP:  (!) 105/58 120/62 117/70  Pulse: 92 90 91 79  Resp:  14 18 18   Temp:  98.9 F (37.2 C) 98.3 F (36.8 C) 98.4 F (36.9 C)  TempSrc:  Axillary  Oral  SpO2: 100% 97% 96% 99%  Weight:      Height:        Resting comfortably in bed in NAD Sensation intact distally Intact pulses distally Dorsiflexion/Plantar flexion intact Incision: incision healing well no erythema. Moderate expected post op swelling. Compartment soft    Lab Results  Component Value Date   WBC 8.7 07/13/2024   HGB 7.1 (L) 07/13/2024   HCT 23.8 (L) 07/13/2024   MCV 83.2 07/13/2024   PLT 185 07/13/2024   BMET    Component Value Date/Time   NA 141 07/10/2024 0314   NA 139 10/21/2023 1054   K 4.9 07/10/2024 0314   CL 106 07/10/2024 0314   CO2 27 07/10/2024 0314   GLUCOSE 217 (H) 07/10/2024 0314   BUN 16 07/10/2024 0314   BUN 8 10/21/2023 1054   CREATININE 0.88 07/10/2024 0314   CALCIUM  8.9 07/10/2024 0314   CALCIUM  8.3 (L) 08/28/2010 0525   EGFR 91 10/21/2023 1054   GFRNONAA >60 07/10/2024 0314    Xray: Revision total hip arthroplasty components in good position no adverse features  Assessment/Plan: 4 Days Post-Op   Principal Problem:   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 Discussed 1U pRBC today. Okay to discharge home after if doing well otherwise.   Post op recs: WB: 50% partial weightbearing right LE, posterior hip precautions x 6 weeks   Abx: ancef  postop, follow-up IntraOp cultures.  Discharge on extended p.o. antibiotics.  IntraOp cultures no growth to date Imaging: PACU pelvis Xray Dressing: Prevena wound VAC to stay on for 1 week DVT prophylaxis: Eliquis  2.5 mg twice daily starting POD1 Follow up: 2 weeks after surgery for a wound check with Dr. Edna at [facility].  Address: [address], [place], [place] [address]  Office Phone: [phone]     TORIBIO DELENA EDNA 07/13/2024, 5:58 AM     Contact information:   Weekdays 7am-5pm epic message Dr. Edna, or call office for patient follow up: [phone] After hours and holidays please check [email or web address] for group call information for [facility]

## 2024-07-13 NOTE — Plan of Care (Signed)
  Problem: Education: Goal: Understanding of discharge needs will improve Outcome: Progressing   Problem: Activity: Goal: Ability to avoid complications of mobility impairment will improve Outcome: Progressing   Problem: Clinical Measurements: Goal: Postoperative complications will be avoided or minimized Outcome: Progressing   Problem: Pain Management: Goal: Pain level will decrease with appropriate interventions Outcome: Progressing   Problem: Skin Integrity: Goal: Will show signs of wound healing Outcome: Progressing

## 2024-07-13 NOTE — Progress Notes (Signed)
 Patient is not discharging today. TOC meds taken to the Inpatient pharmacy and secured.

## 2024-07-13 NOTE — Progress Notes (Addendum)
-----------------------------------------------  Patient assessed by the [facility]------------------------------------   Chart reviewed:Yes   Documentation gaps:  Labs, test, and orders reviewed: yes  30-day Readmission: No  Discharge order: Yes  Current discharge plan: Home with HHPT through [facility]. Already arranged by [facility] team. No DME need noted.   Barrier to discharge before 11am: Per RN, pt. Will need one unit of blood transfused prior to discharge home today.   Intervention provided by [facility] team: Secure Chat with primary team.   Barrier resolved: No immediate barrier identified at this time after completion of blood transfusion as ordered.   Cebastian Neis, RN [employer] [facility]

## 2024-07-14 LAB — CBC
HCT: 26.5 % — ABNORMAL LOW (ref 36.0–46.0)
Hemoglobin: 8.2 g/dL — ABNORMAL LOW (ref 12.0–15.0)
MCH: 26.2 pg (ref 26.0–34.0)
MCHC: 30.9 g/dL (ref 30.0–36.0)
MCV: 84.7 fL (ref 80.0–100.0)
Platelets: 186 K/uL (ref 150–400)
RBC: 3.13 MIL/uL — ABNORMAL LOW (ref 3.87–5.11)
RDW: 16.7 % — ABNORMAL HIGH (ref 11.5–15.5)
WBC: 8.3 K/uL (ref 4.0–10.5)
nRBC: 0 % (ref 0.0–0.2)

## 2024-07-14 NOTE — Progress Notes (Signed)
 Ortho Note  Doing better this AM, Hgb responded well to PRBC and is at 8.4. Pain controlled and patients feels up to going home. Will place DC order and patient has follow up appointment with Dr. Edna on Tuesday for follow up. Aquacell is clean and dry this AM.  Franky MYRTIS Light, MD [facility] [phone] (office) [email or web address]

## 2024-07-14 NOTE — Plan of Care (Signed)
   Problem: Coping: Goal: Level of anxiety will decrease Outcome: Progressing   Problem: Pain Managment: Goal: General experience of comfort will improve and/or be controlled Outcome: Progressing   Problem: Safety: Goal: Ability to remain free from injury will improve Outcome: Progressing

## 2024-07-14 NOTE — Progress Notes (Signed)
 Provided discharge education/instructions, all questions answered, TOC meds handed to Pt. Pt is not in any distress. Pt discharged home with all of her belongings.

## 2024-07-14 NOTE — Plan of Care (Signed)
  Problem: Clinical Measurements: Goal: Ability to maintain clinical measurements within normal limits will improve Outcome: Progressing   Problem: Safety: Goal: Ability to remain free from injury will improve Outcome: Progressing   Problem: Pain Management: Goal: Pain level will decrease with appropriate interventions Outcome: Progressing   Problem: Skin Integrity: Goal: Will show signs of wound healing Outcome: Progressing

## 2024-07-15 DIAGNOSIS — T84018A Broken internal joint prosthesis, other site, initial encounter: Secondary | ICD-10-CM | POA: Diagnosis not present

## 2024-07-15 LAB — AEROBIC/ANAEROBIC CULTURE W GRAM STAIN (SURGICAL/DEEP WOUND)
Culture: NO GROWTH
Culture: NO GROWTH
Culture: NO GROWTH
Gram Stain: NONE SEEN
Gram Stain: NONE SEEN
Gram Stain: NONE SEEN

## 2024-07-16 LAB — TYPE AND SCREEN
ABO/RH(D): B POS
Antibody Screen: NEGATIVE
Unit division: 0

## 2024-07-16 LAB — BPAM RBC
Blood Product Expiration Date: 202511192359
ISSUE DATE / TIME: 202510241048
Unit Type and Rh: 7300

## 2024-07-17 ENCOUNTER — Other Ambulatory Visit (HOSPITAL_COMMUNITY): Payer: Self-pay

## 2024-07-17 MED ORDER — METHOCARBAMOL 500 MG PO TABS
500.0000 mg | ORAL_TABLET | Freq: Four times a day (QID) | ORAL | 0 refills | Status: AC
  Filled 2024-07-17: qty 28, 7d supply, fill #0

## 2024-07-17 MED ORDER — OXYCODONE HCL 5 MG PO TABS
5.0000 mg | ORAL_TABLET | ORAL | 0 refills | Status: AC | PRN
  Filled 2024-07-17: qty 30, 5d supply, fill #0

## 2024-07-17 MED ORDER — FLUCONAZOLE 150 MG PO TABS
150.0000 mg | ORAL_TABLET | Freq: Once | ORAL | 0 refills | Status: AC
  Filled 2024-07-17: qty 1, 1d supply, fill #0

## 2024-07-19 ENCOUNTER — Other Ambulatory Visit (HOSPITAL_COMMUNITY): Payer: Self-pay

## 2024-07-19 ENCOUNTER — Other Ambulatory Visit (HOSPITAL_BASED_OUTPATIENT_CLINIC_OR_DEPARTMENT_OTHER): Payer: Self-pay

## 2024-07-19 DIAGNOSIS — G894 Chronic pain syndrome: Secondary | ICD-10-CM | POA: Diagnosis not present

## 2024-07-19 DIAGNOSIS — G4700 Insomnia, unspecified: Secondary | ICD-10-CM | POA: Diagnosis not present

## 2024-07-19 DIAGNOSIS — M47817 Spondylosis without myelopathy or radiculopathy, lumbosacral region: Secondary | ICD-10-CM | POA: Diagnosis not present

## 2024-07-19 DIAGNOSIS — I10 Essential (primary) hypertension: Secondary | ICD-10-CM | POA: Diagnosis not present

## 2024-07-19 MED ORDER — LIDOCAINE 5 % EX PTCH
3.0000 | MEDICATED_PATCH | CUTANEOUS | 0 refills | Status: AC
  Filled 2024-07-19: qty 90, 30d supply, fill #0

## 2024-07-19 MED ORDER — BACLOFEN 10 MG PO TABS
10.0000 mg | ORAL_TABLET | Freq: Two times a day (BID) | ORAL | 0 refills | Status: AC
  Filled 2024-07-19: qty 180, 90d supply, fill #0

## 2024-07-19 MED ORDER — MOVANTIK 25 MG PO TABS
25.0000 mg | ORAL_TABLET | Freq: Every day | ORAL | 2 refills | Status: AC
  Filled 2024-07-19: qty 30, 30d supply, fill #0

## 2024-07-19 MED ORDER — OXYCODONE-ACETAMINOPHEN 10-325 MG PO TABS
1.0000 | ORAL_TABLET | Freq: Four times a day (QID) | ORAL | 0 refills | Status: AC
  Filled 2024-07-19 – 2024-08-02 (×2): qty 120, 30d supply, fill #0

## 2024-07-19 MED ORDER — OXYCODONE-ACETAMINOPHEN 10-325 MG PO TABS
1.0000 | ORAL_TABLET | Freq: Four times a day (QID) | ORAL | 0 refills | Status: AC
  Filled 2024-08-21: qty 120, 30d supply, fill #0
  Filled ????-??-??: fill #0

## 2024-07-19 MED ORDER — PREGABALIN 75 MG PO CAPS
75.0000 mg | ORAL_CAPSULE | Freq: Three times a day (TID) | ORAL | 0 refills | Status: AC
  Filled 2024-07-19: qty 270, 90d supply, fill #0

## 2024-07-20 DIAGNOSIS — I10 Essential (primary) hypertension: Secondary | ICD-10-CM | POA: Diagnosis not present

## 2024-07-20 DIAGNOSIS — E7800 Pure hypercholesterolemia, unspecified: Secondary | ICD-10-CM | POA: Diagnosis not present

## 2024-07-30 ENCOUNTER — Encounter

## 2024-07-30 ENCOUNTER — Other Ambulatory Visit (HOSPITAL_COMMUNITY): Payer: Self-pay

## 2024-08-01 ENCOUNTER — Other Ambulatory Visit: Payer: Self-pay

## 2024-08-01 ENCOUNTER — Other Ambulatory Visit (HOSPITAL_COMMUNITY): Payer: Self-pay

## 2024-08-02 ENCOUNTER — Other Ambulatory Visit (HOSPITAL_COMMUNITY): Payer: Self-pay

## 2024-08-03 ENCOUNTER — Other Ambulatory Visit (HOSPITAL_COMMUNITY): Payer: Self-pay

## 2024-08-03 MED ORDER — OXYCODONE-ACETAMINOPHEN 10-325 MG PO TABS
1.0000 | ORAL_TABLET | Freq: Four times a day (QID) | ORAL | 0 refills | Status: AC | PRN
  Filled 2024-08-03: qty 30, 8d supply, fill #0

## 2024-08-06 ENCOUNTER — Other Ambulatory Visit (HOSPITAL_COMMUNITY): Payer: Self-pay

## 2024-08-06 MED ORDER — OXYCODONE-ACETAMINOPHEN 10-325 MG PO TABS
1.0000 | ORAL_TABLET | Freq: Four times a day (QID) | ORAL | 0 refills | Status: AC
  Filled 2024-08-21: qty 120, 30d supply, fill #0

## 2024-08-07 ENCOUNTER — Other Ambulatory Visit: Payer: Self-pay

## 2024-08-13 ENCOUNTER — Other Ambulatory Visit: Payer: Self-pay

## 2024-08-21 ENCOUNTER — Other Ambulatory Visit (HOSPITAL_COMMUNITY): Payer: Self-pay

## 2024-08-21 MED ORDER — OXYCODONE-ACETAMINOPHEN 10-325 MG PO TABS
ORAL_TABLET | ORAL | 0 refills | Status: AC
  Filled 2024-08-21: qty 120, 30d supply, fill #0

## 2024-08-21 MED ORDER — MOVANTIK 25 MG PO TABS: 25.0000 mg | ORAL_TABLET | Freq: Every day | ORAL | 2 refills | Status: AC

## 2024-08-21 MED ORDER — PREGABALIN 75 MG PO CAPS: 75.0000 mg | ORAL_CAPSULE | Freq: Three times a day (TID) | ORAL | 0 refills | Status: AC

## 2024-08-21 MED ORDER — OXYCODONE-ACETAMINOPHEN 10-325 MG PO TABS
1.0000 | ORAL_TABLET | Freq: Four times a day (QID) | ORAL | 0 refills | Status: AC
  Filled 2024-10-20: qty 120, 30d supply, fill #0

## 2024-08-21 MED ORDER — LIDOCAINE 5 % EX PTCH: MEDICATED_PATCH | CUTANEOUS | 0 refills | Status: AC

## 2024-08-21 MED ORDER — BACLOFEN 10 MG PO TABS
10.0000 mg | ORAL_TABLET | Freq: Two times a day (BID) | ORAL | 0 refills | Status: AC
  Filled 2024-08-21: qty 180, 90d supply, fill #0

## 2024-08-22 ENCOUNTER — Other Ambulatory Visit (HOSPITAL_COMMUNITY): Payer: Self-pay

## 2024-08-27 ENCOUNTER — Encounter

## 2024-08-29 ENCOUNTER — Other Ambulatory Visit (HOSPITAL_COMMUNITY): Payer: Self-pay

## 2024-08-29 ENCOUNTER — Encounter: Payer: Self-pay | Admitting: Cardiology

## 2024-09-03 ENCOUNTER — Ambulatory Visit: Admitting: Podiatry

## 2024-09-18 ENCOUNTER — Other Ambulatory Visit (HOSPITAL_COMMUNITY): Payer: Self-pay

## 2024-09-18 MED ORDER — OXYCODONE-ACETAMINOPHEN 10-325 MG PO TABS
ORAL_TABLET | ORAL | 0 refills | Status: AC
  Filled 2024-09-18: qty 120, 30d supply, fill #0

## 2024-10-10 ENCOUNTER — Encounter

## 2024-10-12 ENCOUNTER — Ambulatory Visit
Admission: RE | Admit: 2024-10-12 | Discharge: 2024-10-12 | Disposition: A | Source: Ambulatory Visit | Attending: Obstetrics and Gynecology | Admitting: Obstetrics and Gynecology

## 2024-10-12 DIAGNOSIS — R921 Mammographic calcification found on diagnostic imaging of breast: Secondary | ICD-10-CM

## 2024-10-16 ENCOUNTER — Other Ambulatory Visit: Payer: Self-pay | Admitting: Obstetrics and Gynecology

## 2024-10-16 DIAGNOSIS — R928 Other abnormal and inconclusive findings on diagnostic imaging of breast: Secondary | ICD-10-CM

## 2024-10-20 ENCOUNTER — Other Ambulatory Visit (HOSPITAL_COMMUNITY): Payer: Self-pay

## 2024-10-22 ENCOUNTER — Other Ambulatory Visit (HOSPITAL_COMMUNITY): Payer: Self-pay

## 2024-10-25 ENCOUNTER — Other Ambulatory Visit: Payer: Self-pay

## 2024-10-25 ENCOUNTER — Other Ambulatory Visit (HOSPITAL_COMMUNITY): Payer: Self-pay

## 2024-10-25 MED ORDER — OXYCODONE-ACETAMINOPHEN 10-325 MG PO TABS
1.0000 | ORAL_TABLET | Freq: Four times a day (QID) | ORAL | 0 refills | Status: AC
  Filled 2024-10-25: qty 120, 30d supply, fill #0

## 2024-10-25 MED ORDER — OXYCODONE-ACETAMINOPHEN 10-325 MG PO TABS: 1.0000 | ORAL_TABLET | Freq: Four times a day (QID) | ORAL | 0 refills | Status: AC | PRN

## 2024-10-25 MED ORDER — LIDOCAINE 5 % EX PTCH
3.0000 | MEDICATED_PATCH | CUTANEOUS | 0 refills | Status: AC
  Filled 2024-10-25: qty 90, 30d supply, fill #0

## 2024-10-25 MED ORDER — BACLOFEN 10 MG PO TABS
10.0000 mg | ORAL_TABLET | Freq: Two times a day (BID) | ORAL | 0 refills | Status: AC
  Filled 2024-10-25: qty 180, 90d supply, fill #0

## 2024-10-25 MED ORDER — PREGABALIN 75 MG PO CAPS
75.0000 mg | ORAL_CAPSULE | Freq: Three times a day (TID) | ORAL | 0 refills | Status: AC
  Filled 2024-10-25: qty 270, 90d supply, fill #0

## 2024-10-30 ENCOUNTER — Ambulatory Visit: Admitting: Neurology

## 2025-04-15 ENCOUNTER — Encounter
